# Patient Record
Sex: Male | Born: 1945 | Race: White | Hispanic: No | Marital: Married | Smoking: Former smoker
Health system: Southern US, Community
[De-identification: ages and names within clinical notes are randomized; demographics above are authoritative.]

## PROBLEM LIST (undated history)

## (undated) ENCOUNTER — Emergency Department (HOSPITAL_COMMUNITY): Admission: EM | Discharge status: Absent without leave | Payer: Medicare Other

## (undated) DIAGNOSIS — I10 Essential (primary) hypertension: Secondary | ICD-10-CM

## (undated) DIAGNOSIS — M5416 Radiculopathy, lumbar region: Secondary | ICD-10-CM

## (undated) DIAGNOSIS — I219 Acute myocardial infarction, unspecified: Secondary | ICD-10-CM

## (undated) DIAGNOSIS — I25810 Atherosclerosis of coronary artery bypass graft(s) without angina pectoris: Secondary | ICD-10-CM

## (undated) DIAGNOSIS — I77810 Thoracic aortic ectasia: Secondary | ICD-10-CM

## (undated) DIAGNOSIS — IMO0001 Reserved for inherently not codable concepts without codable children: Secondary | ICD-10-CM

## (undated) DIAGNOSIS — M109 Gout, unspecified: Secondary | ICD-10-CM

## (undated) DIAGNOSIS — I5032 Chronic diastolic (congestive) heart failure: Secondary | ICD-10-CM

## (undated) DIAGNOSIS — Z950 Presence of cardiac pacemaker: Secondary | ICD-10-CM

## (undated) DIAGNOSIS — N400 Enlarged prostate without lower urinary tract symptoms: Secondary | ICD-10-CM

## (undated) DIAGNOSIS — M549 Dorsalgia, unspecified: Secondary | ICD-10-CM

## (undated) DIAGNOSIS — F329 Major depressive disorder, single episode, unspecified: Secondary | ICD-10-CM

## (undated) DIAGNOSIS — I509 Heart failure, unspecified: Secondary | ICD-10-CM

## (undated) DIAGNOSIS — G629 Polyneuropathy, unspecified: Secondary | ICD-10-CM

## (undated) DIAGNOSIS — IMO0002 Reserved for concepts with insufficient information to code with codable children: Secondary | ICD-10-CM

## (undated) DIAGNOSIS — E785 Hyperlipidemia, unspecified: Secondary | ICD-10-CM

## (undated) DIAGNOSIS — C61 Malignant neoplasm of prostate: Secondary | ICD-10-CM

## (undated) DIAGNOSIS — I442 Atrioventricular block, complete: Secondary | ICD-10-CM

## (undated) DIAGNOSIS — M1990 Unspecified osteoarthritis, unspecified site: Secondary | ICD-10-CM

## (undated) DIAGNOSIS — I2510 Atherosclerotic heart disease of native coronary artery without angina pectoris: Secondary | ICD-10-CM

## (undated) DIAGNOSIS — G4730 Sleep apnea, unspecified: Secondary | ICD-10-CM

## (undated) DIAGNOSIS — H269 Unspecified cataract: Secondary | ICD-10-CM

## (undated) DIAGNOSIS — E119 Type 2 diabetes mellitus without complications: Secondary | ICD-10-CM

## (undated) DIAGNOSIS — G4700 Insomnia, unspecified: Secondary | ICD-10-CM

## (undated) DIAGNOSIS — Z8669 Personal history of other diseases of the nervous system and sense organs: Secondary | ICD-10-CM

## (undated) DIAGNOSIS — M62838 Other muscle spasm: Secondary | ICD-10-CM

## (undated) DIAGNOSIS — G8929 Other chronic pain: Secondary | ICD-10-CM

## (undated) HISTORY — DX: Atrioventricular block, complete: I44.2

## (undated) HISTORY — DX: Atherosclerosis of coronary artery bypass graft(s) without angina pectoris: I25.810

## (undated) HISTORY — DX: Essential (primary) hypertension: I10

## (undated) HISTORY — DX: Reserved for concepts with insufficient information to code with codable children: IMO0002

## (undated) HISTORY — DX: Malignant neoplasm of prostate: C61

## (undated) HISTORY — DX: Gout, unspecified: M10.9

## (undated) HISTORY — DX: Radiculopathy, lumbar region: M54.16

## (undated) HISTORY — DX: Benign prostatic hyperplasia without lower urinary tract symptoms: N40.0

## (undated) HISTORY — DX: Atherosclerotic heart disease of native coronary artery without angina pectoris: I25.10

## (undated) HISTORY — DX: Chronic diastolic (congestive) heart failure: I50.32

## (undated) HISTORY — PX: OTHER SURGICAL HISTORY: SHX169

## (undated) HISTORY — DX: Major depressive disorder, single episode, unspecified: F32.9

## (undated) HISTORY — PX: COLONOSCOPY: SHX174

## (undated) HISTORY — DX: Hyperlipidemia, unspecified: E78.5

## (undated) HISTORY — DX: Type 2 diabetes mellitus without complications: E11.9

## (undated) HISTORY — DX: Thoracic aortic ectasia: I77.810

## (undated) HISTORY — DX: Presence of cardiac pacemaker: Z95.0

## (undated) HISTORY — DX: Insomnia, unspecified: G47.00

## (undated) HISTORY — PX: INSERT / REPLACE / REMOVE PACEMAKER: SUR710

---

## 1968-09-30 HISTORY — PX: OTHER SURGICAL HISTORY: SHX169

## 1989-01-30 HISTORY — PX: OTHER SURGICAL HISTORY: SHX169

## 1990-01-30 HISTORY — PX: CORONARY ARTERY BYPASS GRAFT: SHX141

## 2000-06-06 ENCOUNTER — Inpatient Hospital Stay (HOSPITAL_COMMUNITY): Admission: EM | Admit: 2000-06-06 | Discharge: 2000-06-08 | Payer: Self-pay | Admitting: Emergency Medicine

## 2000-06-06 ENCOUNTER — Encounter: Payer: Self-pay | Admitting: Emergency Medicine

## 2000-11-28 ENCOUNTER — Inpatient Hospital Stay (HOSPITAL_COMMUNITY): Admission: RE | Admit: 2000-11-28 | Discharge: 2000-11-30 | Payer: Self-pay | Admitting: *Deleted

## 2001-01-04 ENCOUNTER — Encounter: Payer: Self-pay | Admitting: Internal Medicine

## 2001-01-04 ENCOUNTER — Ambulatory Visit (HOSPITAL_COMMUNITY): Admission: RE | Admit: 2001-01-04 | Discharge: 2001-01-04 | Payer: Self-pay | Admitting: Internal Medicine

## 2001-07-26 ENCOUNTER — Inpatient Hospital Stay (HOSPITAL_COMMUNITY): Admission: EM | Admit: 2001-07-26 | Discharge: 2001-07-30 | Payer: Self-pay | Admitting: Emergency Medicine

## 2001-07-26 ENCOUNTER — Encounter: Payer: Self-pay | Admitting: Emergency Medicine

## 2001-08-26 ENCOUNTER — Encounter: Payer: Self-pay | Admitting: Emergency Medicine

## 2001-08-26 ENCOUNTER — Inpatient Hospital Stay (HOSPITAL_COMMUNITY): Admission: EM | Admit: 2001-08-26 | Discharge: 2001-08-29 | Payer: Self-pay | Admitting: Emergency Medicine

## 2001-08-29 ENCOUNTER — Encounter: Payer: Self-pay | Admitting: Cardiology

## 2002-01-13 ENCOUNTER — Ambulatory Visit (HOSPITAL_COMMUNITY): Admission: RE | Admit: 2002-01-13 | Discharge: 2002-01-14 | Payer: Self-pay | Admitting: Cardiology

## 2002-01-13 ENCOUNTER — Encounter: Payer: Self-pay | Admitting: Cardiology

## 2002-12-01 ENCOUNTER — Inpatient Hospital Stay (HOSPITAL_COMMUNITY): Admission: EM | Admit: 2002-12-01 | Discharge: 2002-12-03 | Payer: Self-pay | Admitting: Emergency Medicine

## 2002-12-07 ENCOUNTER — Emergency Department (HOSPITAL_COMMUNITY): Admission: EM | Admit: 2002-12-07 | Discharge: 2002-12-07 | Payer: Self-pay | Admitting: Emergency Medicine

## 2002-12-16 ENCOUNTER — Inpatient Hospital Stay (HOSPITAL_COMMUNITY): Admission: EM | Admit: 2002-12-16 | Discharge: 2002-12-19 | Payer: Self-pay | Admitting: Emergency Medicine

## 2003-02-02 ENCOUNTER — Encounter (HOSPITAL_COMMUNITY): Admission: RE | Admit: 2003-02-02 | Discharge: 2003-03-31 | Payer: Self-pay | Admitting: Cardiology

## 2003-04-01 ENCOUNTER — Encounter (HOSPITAL_COMMUNITY): Admission: RE | Admit: 2003-04-01 | Discharge: 2003-06-30 | Payer: Self-pay | Admitting: Cardiology

## 2003-07-01 ENCOUNTER — Encounter (HOSPITAL_COMMUNITY): Admission: RE | Admit: 2003-07-01 | Discharge: 2003-09-29 | Payer: Self-pay | Admitting: Cardiology

## 2003-10-06 ENCOUNTER — Inpatient Hospital Stay (HOSPITAL_COMMUNITY): Admission: EM | Admit: 2003-10-06 | Discharge: 2003-10-08 | Payer: Self-pay | Admitting: Emergency Medicine

## 2003-11-19 ENCOUNTER — Encounter: Admission: RE | Admit: 2003-11-19 | Discharge: 2003-12-21 | Payer: Self-pay | Admitting: Cardiology

## 2003-12-04 ENCOUNTER — Ambulatory Visit: Payer: Self-pay | Admitting: Cardiovascular Disease

## 2003-12-11 ENCOUNTER — Ambulatory Visit: Payer: Self-pay | Admitting: Cardiology

## 2004-01-12 ENCOUNTER — Ambulatory Visit: Payer: Self-pay | Admitting: Internal Medicine

## 2004-01-24 ENCOUNTER — Emergency Department (HOSPITAL_COMMUNITY): Admission: EM | Admit: 2004-01-24 | Discharge: 2004-01-24 | Payer: Self-pay | Admitting: Emergency Medicine

## 2004-01-26 ENCOUNTER — Emergency Department (HOSPITAL_COMMUNITY): Admission: EM | Admit: 2004-01-26 | Discharge: 2004-01-26 | Payer: Self-pay | Admitting: Emergency Medicine

## 2004-01-27 ENCOUNTER — Emergency Department (HOSPITAL_COMMUNITY): Admission: EM | Admit: 2004-01-27 | Discharge: 2004-01-27 | Payer: Self-pay | Admitting: Emergency Medicine

## 2004-02-02 ENCOUNTER — Ambulatory Visit: Payer: Self-pay | Admitting: Cardiology

## 2004-02-08 ENCOUNTER — Ambulatory Visit: Payer: Self-pay | Admitting: Internal Medicine

## 2004-03-07 ENCOUNTER — Ambulatory Visit: Payer: Self-pay | Admitting: Internal Medicine

## 2004-03-24 ENCOUNTER — Ambulatory Visit: Payer: Self-pay | Admitting: Internal Medicine

## 2004-05-04 ENCOUNTER — Inpatient Hospital Stay (HOSPITAL_COMMUNITY): Admission: EM | Admit: 2004-05-04 | Discharge: 2004-05-06 | Payer: Self-pay | Admitting: Emergency Medicine

## 2004-05-04 ENCOUNTER — Ambulatory Visit: Payer: Self-pay | Admitting: Cardiology

## 2004-05-11 ENCOUNTER — Ambulatory Visit: Payer: Self-pay | Admitting: Internal Medicine

## 2004-06-08 ENCOUNTER — Ambulatory Visit: Payer: Self-pay | Admitting: Cardiology

## 2004-06-13 ENCOUNTER — Ambulatory Visit: Payer: Self-pay | Admitting: Cardiology

## 2004-06-20 ENCOUNTER — Ambulatory Visit: Payer: Self-pay | Admitting: Internal Medicine

## 2004-07-07 ENCOUNTER — Ambulatory Visit: Payer: Self-pay | Admitting: Internal Medicine

## 2004-07-11 ENCOUNTER — Emergency Department (HOSPITAL_COMMUNITY): Admission: EM | Admit: 2004-07-11 | Discharge: 2004-07-11 | Payer: Self-pay | Admitting: Emergency Medicine

## 2004-08-14 ENCOUNTER — Inpatient Hospital Stay (HOSPITAL_COMMUNITY): Admission: EM | Admit: 2004-08-14 | Discharge: 2004-08-18 | Payer: Self-pay | Admitting: Emergency Medicine

## 2004-08-14 ENCOUNTER — Ambulatory Visit: Payer: Self-pay | Admitting: Cardiology

## 2004-09-01 ENCOUNTER — Ambulatory Visit: Payer: Self-pay | Admitting: Internal Medicine

## 2004-09-20 ENCOUNTER — Ambulatory Visit: Payer: Self-pay | Admitting: Internal Medicine

## 2004-09-29 ENCOUNTER — Ambulatory Visit: Payer: Self-pay | Admitting: Nurse Practitioner

## 2004-10-31 ENCOUNTER — Ambulatory Visit: Payer: Self-pay | Admitting: Cardiovascular Disease

## 2004-10-31 ENCOUNTER — Inpatient Hospital Stay (HOSPITAL_COMMUNITY): Admission: AD | Admit: 2004-10-31 | Discharge: 2004-11-02 | Payer: Self-pay | Admitting: Cardiovascular Disease

## 2004-11-09 ENCOUNTER — Ambulatory Visit: Payer: Self-pay | Admitting: Cardiology

## 2004-11-24 ENCOUNTER — Ambulatory Visit: Payer: Self-pay | Admitting: Internal Medicine

## 2005-01-11 ENCOUNTER — Ambulatory Visit: Payer: Self-pay | Admitting: Cardiology

## 2005-02-02 ENCOUNTER — Ambulatory Visit: Payer: Self-pay | Admitting: Cardiology

## 2005-05-04 ENCOUNTER — Ambulatory Visit: Payer: Self-pay | Admitting: Cardiology

## 2005-05-12 ENCOUNTER — Ambulatory Visit: Payer: Self-pay

## 2005-05-17 ENCOUNTER — Ambulatory Visit: Payer: Self-pay | Admitting: Cardiology

## 2005-05-18 ENCOUNTER — Ambulatory Visit: Payer: Self-pay | Admitting: Cardiology

## 2005-05-18 ENCOUNTER — Inpatient Hospital Stay (HOSPITAL_BASED_OUTPATIENT_CLINIC_OR_DEPARTMENT_OTHER): Admission: RE | Admit: 2005-05-18 | Discharge: 2005-05-18 | Payer: Self-pay | Admitting: Cardiology

## 2005-05-29 ENCOUNTER — Ambulatory Visit: Payer: Self-pay | Admitting: Internal Medicine

## 2005-06-12 ENCOUNTER — Ambulatory Visit: Payer: Self-pay | Admitting: Cardiology

## 2005-06-14 ENCOUNTER — Ambulatory Visit: Payer: Self-pay | Admitting: Cardiology

## 2005-06-19 ENCOUNTER — Ambulatory Visit: Payer: Self-pay | Admitting: Internal Medicine

## 2005-07-31 ENCOUNTER — Ambulatory Visit: Payer: Self-pay | Admitting: Internal Medicine

## 2005-08-09 ENCOUNTER — Ambulatory Visit: Payer: Self-pay | Admitting: Internal Medicine

## 2005-08-09 ENCOUNTER — Inpatient Hospital Stay (HOSPITAL_COMMUNITY): Admission: AD | Admit: 2005-08-09 | Discharge: 2005-08-11 | Payer: Self-pay | Admitting: Internal Medicine

## 2005-08-18 ENCOUNTER — Ambulatory Visit: Payer: Self-pay

## 2005-08-30 ENCOUNTER — Ambulatory Visit: Payer: Self-pay | Admitting: *Deleted

## 2005-10-13 ENCOUNTER — Ambulatory Visit: Payer: Self-pay | Admitting: Cardiology

## 2005-10-29 ENCOUNTER — Ambulatory Visit: Payer: Self-pay | Admitting: Cardiology

## 2005-10-29 ENCOUNTER — Inpatient Hospital Stay (HOSPITAL_COMMUNITY): Admission: EM | Admit: 2005-10-29 | Discharge: 2005-10-31 | Payer: Self-pay | Admitting: Emergency Medicine

## 2005-11-02 ENCOUNTER — Ambulatory Visit: Payer: Self-pay | Admitting: Internal Medicine

## 2005-11-16 ENCOUNTER — Ambulatory Visit (HOSPITAL_COMMUNITY): Admission: RE | Admit: 2005-11-16 | Discharge: 2005-11-16 | Payer: Self-pay | Admitting: Internal Medicine

## 2005-11-16 ENCOUNTER — Encounter (INDEPENDENT_AMBULATORY_CARE_PROVIDER_SITE_OTHER): Payer: Self-pay | Admitting: *Deleted

## 2006-02-02 ENCOUNTER — Ambulatory Visit: Payer: Self-pay | Admitting: Internal Medicine

## 2006-02-08 ENCOUNTER — Ambulatory Visit: Payer: Self-pay | Admitting: Cardiology

## 2006-03-28 ENCOUNTER — Inpatient Hospital Stay (HOSPITAL_COMMUNITY): Admission: EM | Admit: 2006-03-28 | Discharge: 2006-03-31 | Payer: Self-pay | Admitting: Emergency Medicine

## 2006-03-28 ENCOUNTER — Ambulatory Visit: Payer: Self-pay | Admitting: Cardiology

## 2006-03-29 ENCOUNTER — Encounter: Payer: Self-pay | Admitting: Cardiology

## 2006-04-05 ENCOUNTER — Encounter: Payer: Self-pay | Admitting: Internal Medicine

## 2006-04-10 ENCOUNTER — Ambulatory Visit: Payer: Self-pay | Admitting: Internal Medicine

## 2006-04-10 LAB — CONVERTED CEMR LAB
Calcium: 9.4 mg/dL (ref 8.4–10.5)
GFR calc Af Amer: 79 mL/min
GFR calc non Af Amer: 66 mL/min
Glucose, Bld: 166 mg/dL — ABNORMAL HIGH (ref 70–99)

## 2006-04-23 ENCOUNTER — Ambulatory Visit: Payer: Self-pay | Admitting: Cardiology

## 2006-05-22 ENCOUNTER — Ambulatory Visit: Payer: Self-pay | Admitting: Cardiology

## 2006-06-21 ENCOUNTER — Encounter (HOSPITAL_COMMUNITY): Admission: RE | Admit: 2006-06-21 | Discharge: 2006-09-19 | Payer: Self-pay | Admitting: Cardiology

## 2006-07-16 ENCOUNTER — Ambulatory Visit: Payer: Self-pay | Admitting: Internal Medicine

## 2006-07-16 LAB — CONVERTED CEMR LAB
Calcium: 9 mg/dL (ref 8.4–10.5)
Chloride: 107 meq/L (ref 96–112)
Cholesterol: 114 mg/dL (ref 0–200)
GFR calc non Af Amer: 81 mL/min
Glucose, Bld: 152 mg/dL — ABNORMAL HIGH (ref 70–99)
Hgb A1c MFr Bld: 7.3 % — ABNORMAL HIGH (ref 4.6–6.0)
LDL Cholesterol: 64 mg/dL (ref 0–99)
VLDL: 19 mg/dL (ref 0–40)

## 2006-07-24 ENCOUNTER — Ambulatory Visit: Payer: Self-pay | Admitting: Cardiology

## 2006-08-28 ENCOUNTER — Ambulatory Visit: Payer: Self-pay | Admitting: Internal Medicine

## 2006-08-29 ENCOUNTER — Encounter: Payer: Self-pay | Admitting: Internal Medicine

## 2006-08-29 DIAGNOSIS — I2510 Atherosclerotic heart disease of native coronary artery without angina pectoris: Secondary | ICD-10-CM

## 2006-08-29 DIAGNOSIS — F3289 Other specified depressive episodes: Secondary | ICD-10-CM

## 2006-08-29 DIAGNOSIS — E119 Type 2 diabetes mellitus without complications: Secondary | ICD-10-CM

## 2006-08-29 DIAGNOSIS — E785 Hyperlipidemia, unspecified: Secondary | ICD-10-CM

## 2006-08-29 DIAGNOSIS — E1140 Type 2 diabetes mellitus with diabetic neuropathy, unspecified: Secondary | ICD-10-CM

## 2006-08-29 DIAGNOSIS — E1165 Type 2 diabetes mellitus with hyperglycemia: Secondary | ICD-10-CM

## 2006-08-29 DIAGNOSIS — N400 Enlarged prostate without lower urinary tract symptoms: Secondary | ICD-10-CM

## 2006-08-29 DIAGNOSIS — E663 Overweight: Secondary | ICD-10-CM | POA: Insufficient documentation

## 2006-08-29 DIAGNOSIS — I10 Essential (primary) hypertension: Secondary | ICD-10-CM

## 2006-08-29 DIAGNOSIS — F329 Major depressive disorder, single episode, unspecified: Secondary | ICD-10-CM

## 2006-08-29 DIAGNOSIS — I509 Heart failure, unspecified: Secondary | ICD-10-CM

## 2006-08-29 HISTORY — DX: Essential (primary) hypertension: I10

## 2006-08-29 HISTORY — DX: Benign prostatic hyperplasia without lower urinary tract symptoms: N40.0

## 2006-08-29 HISTORY — DX: Other specified depressive episodes: F32.89

## 2006-08-29 HISTORY — DX: Atherosclerotic heart disease of native coronary artery without angina pectoris: I25.10

## 2006-08-29 HISTORY — DX: Hyperlipidemia, unspecified: E78.5

## 2006-08-29 HISTORY — DX: Major depressive disorder, single episode, unspecified: F32.9

## 2006-08-29 HISTORY — DX: Type 2 diabetes mellitus without complications: E11.9

## 2006-09-12 ENCOUNTER — Ambulatory Visit: Payer: Self-pay | Admitting: Cardiology

## 2006-09-20 ENCOUNTER — Encounter (HOSPITAL_COMMUNITY): Admission: RE | Admit: 2006-09-20 | Discharge: 2006-10-30 | Payer: Self-pay | Admitting: Cardiology

## 2006-11-20 ENCOUNTER — Ambulatory Visit: Payer: Self-pay | Admitting: Cardiology

## 2006-11-26 ENCOUNTER — Encounter: Payer: Self-pay | Admitting: Internal Medicine

## 2006-11-26 ENCOUNTER — Ambulatory Visit: Payer: Self-pay | Admitting: Internal Medicine

## 2006-11-26 DIAGNOSIS — J309 Allergic rhinitis, unspecified: Secondary | ICD-10-CM | POA: Insufficient documentation

## 2006-11-26 DIAGNOSIS — C61 Malignant neoplasm of prostate: Secondary | ICD-10-CM

## 2006-11-26 DIAGNOSIS — F528 Other sexual dysfunction not due to a substance or known physiological condition: Secondary | ICD-10-CM

## 2006-11-26 HISTORY — DX: Malignant neoplasm of prostate: C61

## 2006-11-29 LAB — CONVERTED CEMR LAB
CO2: 35 meq/L — ABNORMAL HIGH (ref 19–32)
Chloride: 103 meq/L (ref 96–112)
Cholesterol: 121 mg/dL (ref 0–200)
Creatinine, Ser: 1 mg/dL (ref 0.4–1.5)
Glucose, Bld: 134 mg/dL — ABNORMAL HIGH (ref 70–99)
HDL: 44.3 mg/dL (ref >39.0)
Sodium: 144 meq/L (ref 135–145)

## 2006-12-11 ENCOUNTER — Ambulatory Visit: Payer: Self-pay | Admitting: Cardiology

## 2006-12-11 LAB — CONVERTED CEMR LAB
BUN: 17 mg/dL (ref 6–23)
Basophils Absolute: 0 10*3/uL (ref 0.0–0.1)
Basophils Relative: 0.4 % (ref 0.0–1.0)
CO2: 29 meq/L (ref 19–32)
GFR calc Af Amer: 88 mL/min
Hemoglobin: 13.3 g/dL (ref 13.0–17.0)
Lymphocytes Relative: 32 % (ref 12.0–46.0)
MCHC: 35.5 g/dL (ref 30.0–36.0)
Monocytes Absolute: 0.7 10*3/uL (ref 0.2–0.7)
Monocytes Relative: 10 % (ref 3.0–11.0)
Neutro Abs: 3.5 10*3/uL (ref 1.4–7.7)
Potassium: 4.4 meq/L (ref 3.5–5.1)

## 2006-12-18 ENCOUNTER — Ambulatory Visit: Payer: Self-pay | Admitting: Cardiology

## 2006-12-18 LAB — CONVERTED CEMR LAB
BUN: 16 mg/dL (ref 6–23)
CO2: 28 meq/L (ref 19–32)
GFR calc Af Amer: 88 mL/min
Potassium: 4 meq/L (ref 3.5–5.1)
Sodium: 138 meq/L (ref 135–145)

## 2007-01-14 ENCOUNTER — Ambulatory Visit: Payer: Self-pay | Admitting: Internal Medicine

## 2007-01-14 DIAGNOSIS — R21 Rash and other nonspecific skin eruption: Secondary | ICD-10-CM

## 2007-02-18 ENCOUNTER — Ambulatory Visit: Payer: Self-pay | Admitting: Internal Medicine

## 2007-02-18 DIAGNOSIS — S93409A Sprain of unspecified ligament of unspecified ankle, initial encounter: Secondary | ICD-10-CM | POA: Insufficient documentation

## 2007-02-20 ENCOUNTER — Telehealth (INDEPENDENT_AMBULATORY_CARE_PROVIDER_SITE_OTHER): Payer: Self-pay | Admitting: *Deleted

## 2007-03-04 ENCOUNTER — Ambulatory Visit: Payer: Self-pay | Admitting: Internal Medicine

## 2007-03-20 ENCOUNTER — Ambulatory Visit: Payer: Self-pay

## 2007-03-20 ENCOUNTER — Ambulatory Visit: Payer: Self-pay | Admitting: Cardiology

## 2007-03-20 ENCOUNTER — Encounter: Payer: Self-pay | Admitting: Internal Medicine

## 2007-03-20 ENCOUNTER — Encounter: Payer: Self-pay | Admitting: Cardiology

## 2007-04-04 ENCOUNTER — Ambulatory Visit: Payer: Self-pay | Admitting: Cardiology

## 2007-04-22 ENCOUNTER — Ambulatory Visit: Payer: Self-pay | Admitting: Internal Medicine

## 2007-04-22 DIAGNOSIS — R5381 Other malaise: Secondary | ICD-10-CM | POA: Insufficient documentation

## 2007-04-22 DIAGNOSIS — R5383 Other fatigue: Secondary | ICD-10-CM

## 2007-04-22 DIAGNOSIS — M109 Gout, unspecified: Secondary | ICD-10-CM

## 2007-04-22 HISTORY — DX: Gout, unspecified: M10.9

## 2007-04-23 LAB — CONVERTED CEMR LAB
Basophils Relative: 0.6 % (ref 0.0–1.0)
Bilirubin, Direct: 0.2 mg/dL (ref 0.0–0.3)
CO2: 29 meq/L (ref 19–32)
Creatinine, Ser: 0.9 mg/dL (ref 0.4–1.5)
Eosinophils Relative: 1.8 % (ref 0.0–5.0)
GFR calc Af Amer: 110 mL/min
Glucose, Bld: 98 mg/dL (ref 70–99)
HCT: 41.9 % (ref 39.0–52.0)
HDL: 28.4 mg/dL — ABNORMAL LOW (ref >39.0)
Hemoglobin: 13.7 g/dL (ref 13.0–17.0)
Lymphocytes Relative: 36.4 % (ref 12.0–46.0)
Microalb Creat Ratio: 3.6 mg/g (ref 0.0–30.0)
Microalb, Ur: 0.6 mg/dL (ref 0.0–1.9)
Monocytes Absolute: 0.6 10*3/uL (ref 0.2–0.7)
Monocytes Relative: 9.2 % (ref 3.0–11.0)
Neutro Abs: 3.2 10*3/uL (ref 1.4–7.7)
Neutrophils Relative %: 52 % (ref 43.0–77.0)
Potassium: 4.4 meq/L (ref 3.5–5.1)
Sodium: 143 meq/L (ref 135–145)
TSH: 1.3 microintl units/mL (ref 0.35–5.50)
Total Bilirubin: 0.6 mg/dL (ref 0.3–1.2)
Total Protein: 6.7 g/dL (ref 6.0–8.3)
VLDL: 15 mg/dL (ref 0–40)
WBC: 6.1 10*3/uL (ref 4.5–10.5)

## 2007-05-20 ENCOUNTER — Encounter: Payer: Self-pay | Admitting: Endocrinology

## 2007-06-10 ENCOUNTER — Ambulatory Visit: Payer: Self-pay | Admitting: Internal Medicine

## 2007-06-10 DIAGNOSIS — M5416 Radiculopathy, lumbar region: Secondary | ICD-10-CM

## 2007-06-10 DIAGNOSIS — IMO0002 Reserved for concepts with insufficient information to code with codable children: Secondary | ICD-10-CM

## 2007-06-10 HISTORY — DX: Reserved for concepts with insufficient information to code with codable children: IMO0002

## 2007-06-18 ENCOUNTER — Telehealth: Payer: Self-pay | Admitting: Internal Medicine

## 2007-06-24 ENCOUNTER — Emergency Department (HOSPITAL_COMMUNITY): Admission: EM | Admit: 2007-06-24 | Discharge: 2007-06-24 | Payer: Self-pay | Admitting: Emergency Medicine

## 2007-06-27 ENCOUNTER — Encounter: Admission: RE | Admit: 2007-06-27 | Discharge: 2007-06-27 | Payer: Self-pay | Admitting: Orthopedic Surgery

## 2007-07-30 ENCOUNTER — Telehealth (INDEPENDENT_AMBULATORY_CARE_PROVIDER_SITE_OTHER): Payer: Self-pay | Admitting: *Deleted

## 2007-07-31 ENCOUNTER — Ambulatory Visit: Payer: Self-pay | Admitting: Cardiology

## 2007-07-31 LAB — CONVERTED CEMR LAB
Basophils Relative: 0.8 % (ref 0.0–1.0)
CO2: 28 meq/L (ref 19–32)
Calcium: 9.1 mg/dL (ref 8.4–10.5)
Creatinine, Ser: 0.9 mg/dL (ref 0.4–1.5)
GFR calc Af Amer: 110 mL/min
Glucose, Bld: 126 mg/dL — ABNORMAL HIGH (ref 70–99)
HCT: 39.9 % (ref 39.0–52.0)
Hemoglobin: 13.6 g/dL (ref 13.0–17.0)
Lymphocytes Relative: 35.3 % (ref 12.0–46.0)
MCHC: 34 g/dL (ref 30.0–36.0)
Monocytes Absolute: 0.6 10*3/uL (ref 0.1–1.0)
Monocytes Relative: 9.7 % (ref 3.0–12.0)
Neutro Abs: 3 10*3/uL (ref 1.4–7.7)
RBC: 4.55 M/uL (ref 4.22–5.81)
RDW: 12.9 % (ref 11.5–14.6)
Sodium: 143 meq/L (ref 135–145)

## 2007-09-13 ENCOUNTER — Telehealth (INDEPENDENT_AMBULATORY_CARE_PROVIDER_SITE_OTHER): Payer: Self-pay | Admitting: *Deleted

## 2007-10-23 ENCOUNTER — Ambulatory Visit: Payer: Self-pay | Admitting: Cardiology

## 2007-10-23 ENCOUNTER — Inpatient Hospital Stay (HOSPITAL_COMMUNITY): Admission: EM | Admit: 2007-10-23 | Discharge: 2007-10-25 | Payer: Self-pay | Admitting: Emergency Medicine

## 2007-10-23 ENCOUNTER — Ambulatory Visit: Payer: Self-pay | Admitting: Internal Medicine

## 2007-10-23 DIAGNOSIS — G4700 Insomnia, unspecified: Secondary | ICD-10-CM

## 2007-10-23 HISTORY — DX: Insomnia, unspecified: G47.00

## 2007-10-23 LAB — CONVERTED CEMR LAB
CO2: 29 meq/L (ref 19–32)
Chloride: 109 meq/L (ref 96–112)
GFR calc non Af Amer: 72 mL/min
Hgb A1c MFr Bld: 6.3 % — ABNORMAL HIGH (ref 4.6–6.0)
LDL Cholesterol: 62 mg/dL (ref 0–99)
Potassium: 4.4 meq/L (ref 3.5–5.1)
Sodium: 142 meq/L (ref 135–145)
Total CHOL/HDL Ratio: 3.4
Triglycerides: 87 mg/dL (ref 0–149)

## 2007-10-30 ENCOUNTER — Ambulatory Visit: Payer: Self-pay | Admitting: Cardiology

## 2007-11-14 ENCOUNTER — Ambulatory Visit: Payer: Self-pay | Admitting: Cardiology

## 2008-03-04 ENCOUNTER — Ambulatory Visit: Payer: Self-pay | Admitting: Cardiology

## 2008-03-04 ENCOUNTER — Encounter: Payer: Self-pay | Admitting: Cardiology

## 2008-03-04 DIAGNOSIS — Z950 Presence of cardiac pacemaker: Secondary | ICD-10-CM

## 2008-03-04 DIAGNOSIS — I25810 Atherosclerosis of coronary artery bypass graft(s) without angina pectoris: Secondary | ICD-10-CM | POA: Insufficient documentation

## 2008-03-04 HISTORY — DX: Atherosclerosis of coronary artery bypass graft(s) without angina pectoris: I25.810

## 2008-03-04 HISTORY — DX: Presence of cardiac pacemaker: Z95.0

## 2008-03-04 LAB — CONVERTED CEMR LAB
CO2: 30 meq/L (ref 19–32)
Chloride: 103 meq/L (ref 96–112)
Creatinine, Ser: 1 mg/dL (ref 0.4–1.5)
GFR calc non Af Amer: 80 mL/min
Potassium: 4.6 meq/L (ref 3.5–5.1)
Sodium: 139 meq/L (ref 135–145)

## 2008-04-08 ENCOUNTER — Ambulatory Visit: Payer: Self-pay | Admitting: Internal Medicine

## 2008-04-08 ENCOUNTER — Ambulatory Visit: Payer: Self-pay | Admitting: Cardiology

## 2008-04-08 ENCOUNTER — Encounter (INDEPENDENT_AMBULATORY_CARE_PROVIDER_SITE_OTHER): Payer: Self-pay | Admitting: *Deleted

## 2008-04-08 DIAGNOSIS — M109 Gout, unspecified: Secondary | ICD-10-CM | POA: Insufficient documentation

## 2008-04-20 ENCOUNTER — Telehealth (INDEPENDENT_AMBULATORY_CARE_PROVIDER_SITE_OTHER): Payer: Self-pay | Admitting: *Deleted

## 2008-04-21 ENCOUNTER — Ambulatory Visit: Payer: Self-pay | Admitting: Internal Medicine

## 2008-04-21 DIAGNOSIS — M25569 Pain in unspecified knee: Secondary | ICD-10-CM | POA: Insufficient documentation

## 2008-04-21 DIAGNOSIS — M79609 Pain in unspecified limb: Secondary | ICD-10-CM | POA: Insufficient documentation

## 2008-04-24 ENCOUNTER — Encounter: Payer: Self-pay | Admitting: Internal Medicine

## 2008-04-24 ENCOUNTER — Ambulatory Visit: Payer: Self-pay | Admitting: Cardiovascular Disease

## 2008-04-24 LAB — CONVERTED CEMR LAB
AST: 22 units/L (ref 0–37)
Alkaline Phosphatase: 82 units/L (ref 39–117)
Basophils Absolute: 0 10*3/uL (ref 0.0–0.1)
Bilirubin Urine: NEGATIVE
Bilirubin, Direct: 0.1 mg/dL (ref 0.0–0.3)
CO2: 27 meq/L (ref 19–32)
Calcium: 9.2 mg/dL (ref 8.4–10.5)
Chloride: 106 meq/L (ref 96–112)
Creatinine,U: 126.3 mg/dL
Eosinophils Absolute: 0 10*3/uL (ref 0.0–0.7)
Glucose, Bld: 209 mg/dL — ABNORMAL HIGH (ref 70–99)
HCT: 43.4 % (ref 39.0–52.0)
Hemoglobin, Urine: NEGATIVE
Hemoglobin: 15.1 g/dL (ref 13.0–17.0)
Ketones, ur: NEGATIVE mg/dL
LDL Cholesterol: 49 mg/dL (ref 0–99)
Lymphocytes Relative: 14.1 % (ref 12.0–46.0)
Lymphs Abs: 1.3 10*3/uL (ref 0.7–4.0)
MCHC: 34.7 g/dL (ref 30.0–36.0)
Microalb Creat Ratio: 4 mg/g (ref 0.0–30.0)
Microalb, Ur: 0.5 mg/dL (ref 0.0–1.9)
Neutro Abs: 7.9 10*3/uL — ABNORMAL HIGH (ref 1.4–7.7)
Platelets: 162 10*3/uL (ref 150.0–400.0)
Potassium: 5 meq/L (ref 3.5–5.1)
RDW: 12.8 % (ref 11.5–14.6)
Sodium: 140 meq/L (ref 135–145)
TSH: 0.55 microintl units/mL (ref 0.35–5.50)
Total Protein, Urine: NEGATIVE mg/dL
Total Protein: 6.8 g/dL (ref 6.0–8.3)
Urine Glucose: NEGATIVE mg/dL
VLDL: 10 mg/dL (ref 0.0–40.0)

## 2008-04-28 ENCOUNTER — Encounter: Payer: Self-pay | Admitting: Internal Medicine

## 2008-05-05 ENCOUNTER — Encounter: Payer: Self-pay | Admitting: Internal Medicine

## 2008-05-05 ENCOUNTER — Telehealth: Payer: Self-pay | Admitting: Internal Medicine

## 2008-05-12 ENCOUNTER — Telehealth (INDEPENDENT_AMBULATORY_CARE_PROVIDER_SITE_OTHER): Payer: Self-pay | Admitting: *Deleted

## 2008-05-23 ENCOUNTER — Encounter (INDEPENDENT_AMBULATORY_CARE_PROVIDER_SITE_OTHER): Payer: Self-pay | Admitting: *Deleted

## 2008-06-09 DIAGNOSIS — I5032 Chronic diastolic (congestive) heart failure: Secondary | ICD-10-CM

## 2008-06-09 HISTORY — DX: Chronic diastolic (congestive) heart failure: I50.32

## 2008-06-10 ENCOUNTER — Ambulatory Visit: Payer: Self-pay | Admitting: Cardiology

## 2008-06-10 ENCOUNTER — Telehealth (INDEPENDENT_AMBULATORY_CARE_PROVIDER_SITE_OTHER): Payer: Self-pay | Admitting: *Deleted

## 2008-06-10 DIAGNOSIS — R0609 Other forms of dyspnea: Secondary | ICD-10-CM | POA: Insufficient documentation

## 2008-06-12 ENCOUNTER — Telehealth: Payer: Self-pay | Admitting: Cardiology

## 2008-06-14 ENCOUNTER — Ambulatory Visit: Payer: Self-pay | Admitting: Cardiology

## 2008-06-14 ENCOUNTER — Inpatient Hospital Stay (HOSPITAL_COMMUNITY): Admission: EM | Admit: 2008-06-14 | Discharge: 2008-06-16 | Payer: Self-pay | Admitting: Emergency Medicine

## 2008-06-23 ENCOUNTER — Telehealth (INDEPENDENT_AMBULATORY_CARE_PROVIDER_SITE_OTHER): Payer: Self-pay | Admitting: *Deleted

## 2008-06-24 ENCOUNTER — Ambulatory Visit: Payer: Self-pay

## 2008-06-24 ENCOUNTER — Encounter: Payer: Self-pay | Admitting: Cardiology

## 2008-06-30 ENCOUNTER — Ambulatory Visit: Payer: Self-pay | Admitting: Cardiology

## 2008-07-03 ENCOUNTER — Telehealth (INDEPENDENT_AMBULATORY_CARE_PROVIDER_SITE_OTHER): Payer: Self-pay | Admitting: *Deleted

## 2008-07-09 ENCOUNTER — Ambulatory Visit: Payer: Self-pay | Admitting: Internal Medicine

## 2008-07-09 LAB — CONVERTED CEMR LAB
CO2: 29 meq/L (ref 19–32)
Calcium: 9.2 mg/dL (ref 8.4–10.5)
Creatinine, Ser: 0.9 mg/dL (ref 0.4–1.5)
Hgb A1c MFr Bld: 6.9 % — ABNORMAL HIGH (ref 4.6–6.5)
Total CHOL/HDL Ratio: 3
Triglycerides: 59 mg/dL (ref 0.0–149.0)

## 2008-07-17 ENCOUNTER — Encounter: Payer: Self-pay | Admitting: Internal Medicine

## 2008-07-23 ENCOUNTER — Ambulatory Visit: Payer: Self-pay | Admitting: Cardiology

## 2008-08-04 ENCOUNTER — Encounter: Payer: Self-pay | Admitting: Internal Medicine

## 2008-08-31 ENCOUNTER — Ambulatory Visit: Payer: Self-pay | Admitting: Cardiology

## 2008-09-02 ENCOUNTER — Telehealth: Payer: Self-pay | Admitting: Cardiology

## 2008-09-03 ENCOUNTER — Emergency Department (HOSPITAL_COMMUNITY): Admission: EM | Admit: 2008-09-03 | Discharge: 2008-09-03 | Payer: Self-pay | Admitting: Emergency Medicine

## 2008-09-04 ENCOUNTER — Ambulatory Visit: Payer: Self-pay | Admitting: Cardiology

## 2008-09-04 DIAGNOSIS — I442 Atrioventricular block, complete: Secondary | ICD-10-CM

## 2008-09-04 HISTORY — DX: Atrioventricular block, complete: I44.2

## 2008-09-04 LAB — CONVERTED CEMR LAB
Albumin: 3.5 g/dL (ref 3.5–5.2)
CO2: 26 meq/L (ref 19–32)
Chloride: 112 meq/L (ref 96–112)
Eosinophils Relative: 2.7 % (ref 0.0–5.0)
HCT: 36.4 % — ABNORMAL LOW (ref 39.0–52.0)
HDL: 34.4 mg/dL — ABNORMAL LOW (ref >39.00)
Hemoglobin: 13 g/dL (ref 13.0–17.0)
LDL Cholesterol: 49 mg/dL (ref 0–99)
Lymphs Abs: 1.6 10*3/uL (ref 0.7–4.0)
Monocytes Relative: 8.9 % (ref 3.0–12.0)
Neutro Abs: 3.3 10*3/uL (ref 1.4–7.7)
Sodium: 143 meq/L (ref 135–145)
Total CHOL/HDL Ratio: 3
Triglycerides: 68 mg/dL (ref 0.0–149.0)
VLDL: 13.6 mg/dL (ref 0.0–40.0)
WBC: 5.5 10*3/uL (ref 4.5–10.5)

## 2008-09-07 ENCOUNTER — Ambulatory Visit: Payer: Self-pay | Admitting: Internal Medicine

## 2008-09-07 DIAGNOSIS — S61209A Unspecified open wound of unspecified finger without damage to nail, initial encounter: Secondary | ICD-10-CM | POA: Insufficient documentation

## 2008-09-09 ENCOUNTER — Telehealth (INDEPENDENT_AMBULATORY_CARE_PROVIDER_SITE_OTHER): Payer: Self-pay | Admitting: *Deleted

## 2008-09-17 ENCOUNTER — Telehealth: Payer: Self-pay | Admitting: Cardiology

## 2008-09-21 ENCOUNTER — Telehealth (INDEPENDENT_AMBULATORY_CARE_PROVIDER_SITE_OTHER): Payer: Self-pay | Admitting: *Deleted

## 2008-09-22 ENCOUNTER — Encounter: Payer: Self-pay | Admitting: Internal Medicine

## 2008-09-22 ENCOUNTER — Encounter: Payer: Self-pay | Admitting: Cardiology

## 2008-09-22 ENCOUNTER — Telehealth: Payer: Self-pay | Admitting: Cardiology

## 2008-09-23 ENCOUNTER — Encounter (INDEPENDENT_AMBULATORY_CARE_PROVIDER_SITE_OTHER): Payer: Self-pay | Admitting: *Deleted

## 2008-10-01 ENCOUNTER — Encounter
Admission: RE | Admit: 2008-10-01 | Discharge: 2008-10-16 | Payer: Self-pay | Admitting: Physical Medicine and Rehabilitation

## 2008-10-14 ENCOUNTER — Encounter: Payer: Self-pay | Admitting: Cardiology

## 2008-10-14 ENCOUNTER — Encounter: Payer: Self-pay | Admitting: Internal Medicine

## 2008-10-16 ENCOUNTER — Encounter: Payer: Self-pay | Admitting: Cardiology

## 2008-10-16 ENCOUNTER — Encounter: Payer: Self-pay | Admitting: Internal Medicine

## 2008-10-20 ENCOUNTER — Encounter (INDEPENDENT_AMBULATORY_CARE_PROVIDER_SITE_OTHER): Payer: Self-pay | Admitting: Physical Medicine and Rehabilitation

## 2008-10-20 ENCOUNTER — Ambulatory Visit (HOSPITAL_COMMUNITY)
Admission: RE | Admit: 2008-10-20 | Discharge: 2008-10-20 | Payer: Self-pay | Admitting: Physical Medicine and Rehabilitation

## 2008-10-20 ENCOUNTER — Encounter: Payer: Self-pay | Admitting: Cardiology

## 2008-10-22 ENCOUNTER — Ambulatory Visit: Payer: Self-pay | Admitting: Internal Medicine

## 2008-10-22 DIAGNOSIS — R609 Edema, unspecified: Secondary | ICD-10-CM

## 2008-10-23 LAB — CONVERTED CEMR LAB
Albumin: 3.5 g/dL (ref 3.5–5.2)
Alkaline Phosphatase: 55 units/L (ref 39–117)
Basophils Absolute: 0 10*3/uL (ref 0.0–0.1)
CO2: 28 meq/L (ref 19–32)
Calcium: 8.7 mg/dL (ref 8.4–10.5)
Chloride: 99 meq/L (ref 96–112)
Cholesterol: 104 mg/dL (ref 0–200)
Creatinine, Ser: 0.9 mg/dL (ref 0.4–1.5)
Eosinophils Absolute: 0.1 10*3/uL (ref 0.0–0.7)
Glucose, Bld: 137 mg/dL — ABNORMAL HIGH (ref 70–99)
HDL: 36.3 mg/dL — ABNORMAL LOW (ref >39.00)
Hemoglobin: 13.6 g/dL (ref 13.0–17.0)
Lymphocytes Relative: 19.2 % (ref 12.0–46.0)
MCHC: 33.7 g/dL (ref 30.0–36.0)
MCV: 89.4 fL (ref 78.0–100.0)
Monocytes Absolute: 0.8 10*3/uL (ref 0.1–1.0)
Neutro Abs: 7.9 10*3/uL — ABNORMAL HIGH (ref 1.4–7.7)
RDW: 13 % (ref 11.5–14.6)
Sodium: 136 meq/L (ref 135–145)
TSH: 1.24 microintl units/mL (ref 0.35–5.50)
Total CHOL/HDL Ratio: 3
Total Protein: 7.6 g/dL (ref 6.0–8.3)
Triglycerides: 71 mg/dL (ref 0.0–149.0)

## 2008-10-27 ENCOUNTER — Encounter: Payer: Self-pay | Admitting: Internal Medicine

## 2008-10-27 ENCOUNTER — Encounter: Payer: Self-pay | Admitting: Cardiology

## 2008-10-29 ENCOUNTER — Ambulatory Visit: Payer: Self-pay | Admitting: Surgery

## 2008-11-05 ENCOUNTER — Ambulatory Visit: Payer: Self-pay | Admitting: Internal Medicine

## 2008-11-09 ENCOUNTER — Telehealth: Payer: Self-pay | Admitting: Cardiology

## 2008-11-20 ENCOUNTER — Ambulatory Visit: Payer: Self-pay | Admitting: Cardiology

## 2008-12-02 ENCOUNTER — Telehealth: Payer: Self-pay | Admitting: Cardiology

## 2008-12-03 ENCOUNTER — Ambulatory Visit: Payer: Self-pay | Admitting: Internal Medicine

## 2009-01-01 ENCOUNTER — Encounter (INDEPENDENT_AMBULATORY_CARE_PROVIDER_SITE_OTHER): Payer: Self-pay | Admitting: *Deleted

## 2009-01-04 ENCOUNTER — Ambulatory Visit: Payer: Self-pay | Admitting: Cardiology

## 2009-03-09 ENCOUNTER — Ambulatory Visit: Payer: Self-pay | Admitting: Internal Medicine

## 2009-03-09 DIAGNOSIS — R972 Elevated prostate specific antigen [PSA]: Secondary | ICD-10-CM

## 2009-03-10 LAB — CONVERTED CEMR LAB
AST: 28 units/L (ref 0–37)
Alkaline Phosphatase: 67 units/L (ref 39–117)
Basophils Absolute: 0 10*3/uL (ref 0.0–0.1)
Bilirubin Urine: NEGATIVE
Bilirubin, Direct: 0.1 mg/dL (ref 0.0–0.3)
CO2: 29 meq/L (ref 19–32)
Calcium: 9.4 mg/dL (ref 8.4–10.5)
Cholesterol: 104 mg/dL (ref 0–200)
Creatinine, Ser: 0.8 mg/dL (ref 0.4–1.5)
Eosinophils Absolute: 0.2 10*3/uL (ref 0.0–0.7)
Folate: 11.7 ng/mL
GFR calc non Af Amer: 103.75 mL/min (ref >60)
Glucose, Bld: 124 mg/dL — ABNORMAL HIGH (ref 70–99)
Hemoglobin: 13.7 g/dL (ref 13.0–17.0)
Hgb A1c MFr Bld: 6.8 % — ABNORMAL HIGH (ref 4.6–6.5)
Iron: 89 ug/dL (ref 42–165)
Leukocytes, UA: NEGATIVE
Lymphocytes Relative: 23.8 % (ref 12.0–46.0)
MCHC: 32.4 g/dL (ref 30.0–36.0)
Monocytes Relative: 8.5 % (ref 3.0–12.0)
Neutro Abs: 4.3 10*3/uL (ref 1.4–7.7)
Neutrophils Relative %: 64.5 % (ref 43.0–77.0)
Nitrite: NEGATIVE
PSA: 2.99 ng/mL (ref 0.10–4.00)
RBC: 4.48 M/uL (ref 4.22–5.81)
RDW: 13.8 % (ref 11.5–14.6)
Saturation Ratios: 23.4 % (ref 20.0–50.0)
Sodium: 142 meq/L (ref 135–145)
Specific Gravity, Urine: 1.025 (ref 1.000–1.030)
Total Protein, Urine: NEGATIVE mg/dL
Transferrin: 272.1 mg/dL (ref 212.0–360.0)
Vitamin B-12: 379 pg/mL (ref 211–911)
pH: 5 (ref 5.0–8.0)

## 2009-03-19 ENCOUNTER — Telehealth (INDEPENDENT_AMBULATORY_CARE_PROVIDER_SITE_OTHER): Payer: Self-pay | Admitting: *Deleted

## 2009-03-24 ENCOUNTER — Ambulatory Visit: Payer: Self-pay | Admitting: Cardiology

## 2009-03-24 ENCOUNTER — Encounter: Payer: Self-pay | Admitting: Internal Medicine

## 2009-04-08 ENCOUNTER — Ambulatory Visit: Payer: Self-pay | Admitting: Internal Medicine

## 2009-04-30 ENCOUNTER — Ambulatory Visit: Payer: Self-pay | Admitting: Cardiology

## 2009-05-06 ENCOUNTER — Ambulatory Visit: Payer: Self-pay | Admitting: Internal Medicine

## 2009-05-27 ENCOUNTER — Encounter: Payer: Self-pay | Admitting: Internal Medicine

## 2009-07-05 ENCOUNTER — Encounter: Payer: Self-pay | Admitting: Cardiology

## 2009-07-05 ENCOUNTER — Ambulatory Visit: Payer: Self-pay

## 2009-07-15 ENCOUNTER — Encounter: Payer: Self-pay | Admitting: Internal Medicine

## 2009-07-27 ENCOUNTER — Encounter: Payer: Self-pay | Admitting: Internal Medicine

## 2009-07-28 ENCOUNTER — Ambulatory Visit: Payer: Self-pay | Admitting: Cardiology

## 2009-08-04 ENCOUNTER — Telehealth: Payer: Self-pay | Admitting: Internal Medicine

## 2009-08-04 ENCOUNTER — Encounter: Payer: Self-pay | Admitting: Internal Medicine

## 2009-08-06 ENCOUNTER — Encounter: Payer: Self-pay | Admitting: Internal Medicine

## 2009-08-11 ENCOUNTER — Encounter: Payer: Self-pay | Admitting: Internal Medicine

## 2009-08-13 ENCOUNTER — Ambulatory Visit: Payer: Self-pay | Admitting: Internal Medicine

## 2009-08-13 DIAGNOSIS — R42 Dizziness and giddiness: Secondary | ICD-10-CM

## 2009-08-13 DIAGNOSIS — R0989 Other specified symptoms and signs involving the circulatory and respiratory systems: Secondary | ICD-10-CM

## 2009-08-13 DIAGNOSIS — R0609 Other forms of dyspnea: Secondary | ICD-10-CM

## 2009-08-17 ENCOUNTER — Ambulatory Visit: Payer: Self-pay | Admitting: Internal Medicine

## 2009-08-19 ENCOUNTER — Encounter: Payer: Self-pay | Admitting: Internal Medicine

## 2009-08-26 ENCOUNTER — Encounter: Payer: Self-pay | Admitting: Internal Medicine

## 2009-08-26 ENCOUNTER — Telehealth: Payer: Self-pay | Admitting: Internal Medicine

## 2009-09-07 ENCOUNTER — Ambulatory Visit: Payer: Self-pay | Admitting: Internal Medicine

## 2009-09-08 ENCOUNTER — Telehealth (INDEPENDENT_AMBULATORY_CARE_PROVIDER_SITE_OTHER): Payer: Self-pay | Admitting: *Deleted

## 2009-09-23 ENCOUNTER — Encounter: Payer: Self-pay | Admitting: Internal Medicine

## 2009-09-29 ENCOUNTER — Telehealth: Payer: Self-pay | Admitting: Cardiology

## 2009-10-27 ENCOUNTER — Encounter (INDEPENDENT_AMBULATORY_CARE_PROVIDER_SITE_OTHER): Payer: Self-pay | Admitting: *Deleted

## 2009-10-27 ENCOUNTER — Ambulatory Visit: Payer: Self-pay | Admitting: Cardiology

## 2009-10-28 LAB — CONVERTED CEMR LAB
Basophils Absolute: 0 10*3/uL (ref 0.0–0.1)
CO2: 28 meq/L (ref 19–32)
Chloride: 110 meq/L (ref 96–112)
Eosinophils Absolute: 0.2 10*3/uL (ref 0.0–0.7)
Glucose, Bld: 112 mg/dL — ABNORMAL HIGH (ref 70–99)
HCT: 39.3 % (ref 39.0–52.0)
Hemoglobin: 13.3 g/dL (ref 13.0–17.0)
Lymphs Abs: 2.3 10*3/uL (ref 0.7–4.0)
MCHC: 33.9 g/dL (ref 30.0–36.0)
Monocytes Relative: 8.2 % (ref 3.0–12.0)
Neutro Abs: 3.1 10*3/uL (ref 1.4–7.7)
Potassium: 4.5 meq/L (ref 3.5–5.1)
RDW: 15.2 % — ABNORMAL HIGH (ref 11.5–14.6)
Sodium: 142 meq/L (ref 135–145)

## 2009-10-29 ENCOUNTER — Ambulatory Visit: Payer: Self-pay | Admitting: Internal Medicine

## 2009-10-30 HISTORY — PX: INSERT / REPLACE / REMOVE PACEMAKER: SUR710

## 2009-11-02 ENCOUNTER — Ambulatory Visit (HOSPITAL_COMMUNITY): Admission: RE | Admit: 2009-11-02 | Discharge: 2009-11-02 | Payer: Self-pay | Admitting: Cardiology

## 2009-11-09 ENCOUNTER — Encounter: Payer: Self-pay | Admitting: Cardiology

## 2009-11-12 ENCOUNTER — Encounter: Payer: Self-pay | Admitting: Cardiology

## 2009-11-12 ENCOUNTER — Ambulatory Visit: Payer: Self-pay

## 2009-11-16 ENCOUNTER — Ambulatory Visit: Payer: Self-pay | Admitting: Cardiology

## 2009-11-18 ENCOUNTER — Ambulatory Visit: Payer: Self-pay | Admitting: Cardiology

## 2009-11-18 ENCOUNTER — Telehealth: Payer: Self-pay | Admitting: Cardiology

## 2009-11-26 ENCOUNTER — Encounter (INDEPENDENT_AMBULATORY_CARE_PROVIDER_SITE_OTHER): Payer: Self-pay | Admitting: *Deleted

## 2009-12-02 ENCOUNTER — Telehealth: Payer: Self-pay | Admitting: Cardiology

## 2009-12-03 ENCOUNTER — Telehealth (INDEPENDENT_AMBULATORY_CARE_PROVIDER_SITE_OTHER): Payer: Self-pay | Admitting: *Deleted

## 2009-12-07 ENCOUNTER — Telehealth: Payer: Self-pay | Admitting: Cardiology

## 2009-12-13 ENCOUNTER — Telehealth: Payer: Self-pay | Admitting: Cardiology

## 2009-12-20 ENCOUNTER — Encounter: Payer: Self-pay | Admitting: Cardiology

## 2009-12-27 ENCOUNTER — Telehealth: Payer: Self-pay | Admitting: Internal Medicine

## 2009-12-27 ENCOUNTER — Ambulatory Visit: Payer: Self-pay | Admitting: Cardiology

## 2010-01-03 ENCOUNTER — Ambulatory Visit: Payer: Self-pay | Admitting: Internal Medicine

## 2010-01-03 DIAGNOSIS — M542 Cervicalgia: Secondary | ICD-10-CM

## 2010-01-03 LAB — CONVERTED CEMR LAB
BUN: 17 mg/dL (ref 6–23)
CO2: 29 meq/L (ref 19–32)
Calcium: 9.6 mg/dL (ref 8.4–10.5)
Chloride: 100 meq/L (ref 96–112)
Cholesterol: 134 mg/dL (ref 0–200)
Creatinine, Ser: 0.9 mg/dL (ref 0.4–1.5)
GFR calc non Af Amer: 93.93 mL/min (ref >60)
Glucose, Bld: 109 mg/dL — ABNORMAL HIGH (ref 70–99)
HDL: 36.9 mg/dL — ABNORMAL LOW (ref >39.00)
Hgb A1c MFr Bld: 6.8 % — ABNORMAL HIGH (ref 4.6–6.5)
LDL Cholesterol: 75 mg/dL (ref 0–99)
Potassium: 4.2 meq/L (ref 3.5–5.1)
Sodium: 137 meq/L (ref 135–145)
Total CHOL/HDL Ratio: 4
Triglycerides: 109 mg/dL (ref 0.0–149.0)
VLDL: 21.8 mg/dL (ref 0.0–40.0)

## 2010-02-20 ENCOUNTER — Encounter: Payer: Self-pay | Admitting: Sports Medicine

## 2010-02-20 ENCOUNTER — Encounter: Payer: Self-pay | Admitting: Internal Medicine

## 2010-02-27 LAB — CONVERTED CEMR LAB
ALT: 30 units/L (ref 0–53)
AST: 28 units/L (ref 0–37)
BUN: 10 mg/dL (ref 6–23)
BUN: 10 mg/dL (ref 6–23)
BUN: 13 mg/dL (ref 6–23)
Basophils Absolute: 0 10*3/uL (ref 0.0–0.1)
Basophils Relative: 0.3 % (ref 0.0–3.0)
Bilirubin, Direct: 0.1 mg/dL (ref 0.0–0.3)
CO2: 27 meq/L (ref 19–32)
CO2: 31 meq/L (ref 19–32)
Chloride: 103 meq/L (ref 96–112)
Chloride: 110 meq/L (ref 96–112)
Creatinine, Ser: 0.7 mg/dL (ref 0.4–1.5)
Eosinophils Relative: 1 % (ref 0.0–5.0)
Eosinophils Relative: 3.1 % (ref 0.0–5.0)
GFR calc non Af Amer: 95.31 mL/min (ref >60)
Glucose, Bld: 123 mg/dL — ABNORMAL HIGH (ref 70–99)
Glucose, Bld: 132 mg/dL — ABNORMAL HIGH (ref 70–99)
HCT: 38 % — ABNORMAL LOW (ref 39.0–52.0)
HCT: 39 % (ref 39.0–52.0)
HCT: 39.9 % (ref 39.0–52.0)
Hemoglobin: 13.2 g/dL (ref 13.0–17.0)
Ketones, ur: NEGATIVE mg/dL
Lymphs Abs: 1.4 10*3/uL (ref 0.7–4.0)
Lymphs Abs: 1.9 10*3/uL (ref 0.7–4.0)
Lymphs Abs: 2.4 10*3/uL (ref 0.7–4.0)
MCHC: 34.1 g/dL (ref 30.0–36.0)
MCV: 88.7 fL (ref 78.0–100.0)
MCV: 93.1 fL (ref 78.0–100.0)
Monocytes Absolute: 0.5 10*3/uL (ref 0.1–1.0)
Monocytes Absolute: 0.5 10*3/uL (ref 0.1–1.0)
Monocytes Relative: 7.6 % (ref 3.0–12.0)
Neutro Abs: 3.7 10*3/uL (ref 1.4–7.7)
Neutrophils Relative %: 59 % (ref 43.0–77.0)
Platelets: 162 10*3/uL (ref 150.0–400.0)
Platelets: 166 10*3/uL (ref 150.0–400.0)
Platelets: 173 10*3/uL (ref 150.0–400.0)
Potassium: 3.8 meq/L (ref 3.5–5.1)
Potassium: 4.6 meq/L (ref 3.5–5.1)
RBC: 4.39 M/uL (ref 4.22–5.81)
RDW: 15.2 % — ABNORMAL HIGH (ref 11.5–14.6)
RDW: 15.8 % — ABNORMAL HIGH (ref 11.5–14.6)
Sodium: 142 meq/L (ref 135–145)
Specific Gravity, Urine: 1.01 (ref 1.000–1.030)
Total Bilirubin: 0.5 mg/dL (ref 0.3–1.2)
Urine Glucose: NEGATIVE mg/dL
WBC: 5.8 10*3/uL (ref 4.5–10.5)
WBC: 7.9 10*3/uL (ref 4.5–10.5)
pH: 5 (ref 5.0–8.0)

## 2010-03-01 NOTE — Letter (Signed)
Summary: Diabetic Testing Supplies/[facility]  Diabetic Testing Supplies/[facility]   Imported By: Sherian Rein 08/05/2009 13:52:12  _____________________________________________________________________  External Attachment:    Type:   Image     Comment:   External Document

## 2010-03-01 NOTE — Progress Notes (Signed)
Summary: discuss med  Phone Note From Other Clinic   Caller: hugh pruitt from research dept [phone] Request: Talk with Nurse Details of Complaint: discuss coming off meds. pt in research study & bb start pt on pravachal.  Initial call taken by: Lorne Skeens,  November 18, 2009 10:12 AM  Follow-up for Phone Call        LM for [facility] to call. Sherri Rad, RN, BSN  November 18, 2009 11:50 AM  Jena Gauss called back and left a message on my voice mail that the pt is in the IMPROVE-IT study (vytorin vs. simvastatin). Dr. Juanda Chance had started him on Pravastatin at his last office visit. Jena Gauss states he called Dr. Juanda Chance this morning and per Dr. Juanda Chance, orders received to have the pt d/c pravastatin and continue study drug. I will note this on the pt's medication list.  Follow-up by: Sherri Rad, RN, BSN,  November 18, 2009 12:12 PM    New/Updated Medications: * IMPROVE-IT STUDY DRUG as directed

## 2010-03-01 NOTE — Assessment & Plan Note (Signed)
Summary: f73m   Primary Provider:  Corwin Levins MD  CC:  f44m.  No cardiac concerns today.  History of Present Illness: 65 years old and return for followup management of CAD and CHF. He had bypass surgery in 1992. His last catheterization was in 2010 at which time he had total occlusion of the vein graft to the circumflex artery and nonobstructive disease in his other grafts. He also has a history of diastolic heart failure and has a DDD pacemaker implanted for AV block.  He says been doing quite well over the last 4 months with no chest pain or palpitations. He does not feel he has an any fluid excess.  His other problems include hypertension and hyperlipidemia.  Current Medications (verified): 1)  Hydrocodone-Acetaminophen 10-325 Mg Tabs (Hydrocodone-Acetaminophen) .Marland Kitchen.. 1po Q 6 Hrs As Needed 2)  Enalapril Maleate 10 Mg Tabs (Enalapril Maleate) .... Take 1 Tablet By Mouth Once Daily 3)  Metformin Hcl 500 Mg Tb24 (Metformin Hcl) .... Take 4 Tablet By Mouth Once A Day 4)  Plavix 75 Mg Tabs (Clopidogrel Bisulfate) .Marland Kitchen.. 1 Tab Once Daily 5)  Xanax 0.25 Mg Tabs (Alprazolam) .Marland Kitchen.. 1 By Mouth Two Times A Day As Needed 6)  Klor-Con M20 20 Meq  Tbcr (Potassium Chloride Crys Cr) .Marland Kitchen.. 1 By Mouth Qd 7)  Triamcinolone Acetonide 0.5 %  Crea (Triamcinolone Acetonide) .... Use Asd Two Times A Day Prn 8)  Freestyle Test  Strp (Glucose Blood) .... Test Blood Glucose Three Times A Day 9)  Carvedilol 12.5 Mg Tabs (Carvedilol) .... Take One Tablet By Mouth Twice A Day 10)  Glimepiride 4 Mg  Tabs (Glimepiride) .Marland Kitchen.. 1 By Mouth Once Daily 11)  Ecotrin 325 Mg  Tbec (Aspirin) .Marland Kitchen.. 1 By Mouth Qd 12)  Imdur 60 Mg Xr24h-Tab (Isosorbide Mononitrate) .Marland Kitchen.. 1 Tab Once Daily 13)  Allopurinol 300 Mg Tabs (Allopurinol) .Marland Kitchen.. 1 By Mouth Once Daily 14)  Gabapentin 300 Mg  Caps (Gabapentin) .Marland Kitchen.. 1po Three Times A Day 15)  Study Drug 16)  Furosemide 80 Mg Tabs (Furosemide) .... Take One Tablet By Mouth Daily Extra If Needed 17)   Stool Softener 100 Mg Caps (Docusate Sodium) .... Use Asd 18)  Colchicine 0.6 Mg Tabs (Colchicine) .Marland Kitchen.. 1 By Mouth Once Daily  Allergies (verified): 1)  ! * Crestor  Past History:  Past Medical History: Reviewed history from 08/30/2008 and no changes required. Diabetes mellitus, type II Obesity Depression Benign prostatic hypertrophy h/o prostate cancer Allergic rhinitis erectile dysfunction Gout  2. Coronary artery disease status post coronary bypass graft surgery     in 1992 and status post multiple percutaneous intervention with 40%     in-stent restenosis in the native right coronary and 70% in-stent     restenosis within the stent in the LAD supplied by the mammary     graft. 3. Good left ventricular function. 4. Diastolic heart failure 5. History of status post dual-mode, dual-pacing, dual-sensing     pacemaker implantation. 6. Hypertension  7. Hyperlipidemia.      Review of Systems       ROS is negative except as outlined in HPI.   Vital Signs:  Patient profile:   65 year old male Height:      69 inches Weight:      233 pounds BMI:     34.53 Pulse rate:   62 / minute Pulse rhythm:   regular BP sitting:   126 / 72  (left arm) Cuff size:   large  Vitals Entered By: Judithe Modest CMA (April 30, 2009 8:43 AM)  Physical Exam  Additional Exam:  Gen. Well-nourished, in no distress   Neck: jugular venous pulse 1 cm above the clavicle, thyroid not enlarged, no carotid bruits Lungs: No tachypnea, clear without rales, rhonchi or wheezes Cardiovascular: Rhythm regular, PMI not displaced,  heart sounds  normal, no murmurs or gallops, trace peripheral edema, pulses normal in all 4 extremities. Abdomen: BS normal, abdomen soft and non-tender without masses or organomegaly, no hepatosplenomegaly. MS: No deformities, no cyanosis or clubbing   Neuro:  No focal sns   Skin:  no lesions    PPM Specifications Following MD:  Everardo Beals. Juanda Chance, MD     PPM Vendor:   [employer]     PPM Model Number:  [id number]     PPM Serial Number:  [id number] PPM DOI:  08/28/2001      Lead 1    Location: [facility]     DOI: 08/28/2001     Model #: [id number]     Serial #: [id number]     Status: active Lead 2    Location: [facility]     DOI: 08/28/2001     Model #: [id number]     Serial #: [id number]     Status: active   Indications:  2 Av Block   PPM Follow Up Pacer Dependent:  Yes      Episodes Coumadin:  No  Parameters Mode:  DDD     Lower Rate Limit:  60     Upper Rate Limit:  130 Paced AV Delay:  150     Sensed AV Delay:  120  Impression & Recommendations:  Problem # 1:  CAD, AUTOLOGOUS BYPASS GRAFT (ICD-414.02) He had bypass surgery in 1992 and had documented occlusion of the vein graft to circumflex with starting 2010. He's had no chest pain this problem appears stable. The following medications were removed from the medication list:    Plavix 75 Mg Tabs (Clopidogrel bisulfate) .Marland Kitchen... 1 tab once daily    Imdur 60 Mg Xr24h-tab (Isosorbide mononitrate) .Marland Kitchen... 1 tab once daily His updated medication list for this problem includes:    Enalapril Maleate 10 Mg Tabs (Enalapril maleate) .Marland Kitchen... Take 1 tablet by mouth once daily    Carvedilol 12.5 Mg Tabs (Carvedilol) .Marland Kitchen... Take one tablet by mouth twice a day    Aspirin 81 Mg Tbec (Aspirin) .Marland Kitchen... Take one tablet by mouth daily  Problem # 2:  DIASTOLIC HEART FAILURE, CHRONIC (ICD-428.32) He has chronic diastolic heart failure.  He has minimal JVD but no other evidence of volume overload. We will continue his current therapy. The following medications were removed from the medication list:    Plavix 75 Mg Tabs (Clopidogrel bisulfate) .Marland Kitchen... 1 tab once daily    Imdur 60 Mg Xr24h-tab (Isosorbide mononitrate) .Marland Kitchen... 1 tab once daily His updated medication list for this problem includes:    Enalapril Maleate 10 Mg Tabs (Enalapril maleate) .Marland Kitchen... Take 1 tablet by mouth once daily    Carvedilol 12.5 Mg Tabs (Carvedilol) .Marland Kitchen... Take one tablet by mouth  twice a day    Aspirin 81 Mg Tbec (Aspirin) .Marland Kitchen... Take one tablet by mouth daily    Furosemide 80 Mg Tabs (Furosemide) .Marland Kitchen... Take one tablet by mouth daily extra if needed  Problem # 3:  PACEMAKER (ICD-V45.Marland Kitchen01) He has a DDD pacemaker implanted for AV block. We checked his pacemaker on last visit and will not take it today but will recheck  on his followup visit.  Problem # 4:  HYPERLIPIDEMIA (ICD-272.4) He had an excellent lipid profile by his primary care physician recently.  Patient Instructions: 1)  Your physician has recommended you make the following change in your medication: 1) STOP Plavix, 2) STOP Imdur, 3) Decrease aspirin to 81mg  once daily  2)  Your physician wants you to follow-up in:  6 months. You will receive a reminder letter in the mail two months in advance. If you don't receive a letter, please call our office to schedule the follow-up appointment.

## 2010-03-01 NOTE — Medication Information (Signed)
Summary: Diabetic Supplies/[facility]  Diabetic Supplies/[facility]   Imported By: Sherian Rein 08/23/2009 11:35:04  _____________________________________________________________________  External Attachment:    Type:   Image     Comment:   External Document

## 2010-03-01 NOTE — Assessment & Plan Note (Signed)
Summary: f/u from 7/15/cd   Vital Signs:  Patient profile:   65 year old male Height:      71 inches Weight:      239.50 pounds BMI:     33.52 O2 Sat:      97 % on Room air Temp:     97.5 degrees F oral Pulse rate:   65 / minute BP sitting:   112 / 64  (left arm) Cuff size:   large  Vitals Entered By: Zella Ball Ewing CMA Duncan Dull) (August 17, 2009 8:09 AM)  O2 Flow:  Room air  CC: Followup/RE   Primary Care Provider:  Corwin Levins MD  CC:  Followup/RE.  History of Present Illness: here to f/u;  overall 75% better he estimates resolved dyspnea, fatigue and dizziness;  has AC at home but tends to sweat more in the summer;  Pt denies CP, sob, doe, wheezing, orthopnea, pnd, worsening LE edema, palps, or syncope Pt denies new neuro symptoms such as headache, facial or extremity weakness  No fever, wt loss, night sweats, loss of appetite or other constitutional symptoms  Gained 7 lbs with current med regimen.  no new complaints.  To see [facility] nurse next in oct 2011 Pt denies polydipsia, polyuria, or low sugar symptoms such as shakiness improved with eating.  Overall good compliance with meds, trying to follow low chol, DM diet, wt stable, little excercise however   Problems Prior to Update: 1)  Dizziness  (ICD-780.4) 2)  Dyspnea On Exertion  (ICD-786.09) 3)  Fatigue, Acute  (ICD-780.79) 4)  Preventive Health Care  (ICD-V70.0) 5)  Psa, Increased  (ICD-790.93) 6)  Special Screening Malig Neoplasms Other Sites  (ICD-V76.49) 7)  Fatigue  (ICD-780.79) 8)  Acute Gouty Arthropathy  (ICD-274.01) 9)  Peripheral Edema  (ICD-782.3) 10)  Laceration, Finger  (ICD-883.0) 11)  Encounter For Long-term Use of Other Medications  (ICD-V58.69) 12)  Atrioventricular Block, Complete  (ICD-426.0) 13)  Dyspnea  (ICD-786.05) 14)  Diastolic Heart Failure, Chronic  (ICD-428.32) 15)  Leg Pain, Bilateral  (ICD-729.5) 16)  Knee Pain, Bilateral  (ICD-719.46) 17)  Acute Gouty Arthropathy   (ICD-274.01) 18)  Pacemaker  (ICD-V45.Marland Kitchen01) 19)  Cad, Autologous Bypass Graft  (ICD-414.02) 20)  Insomnia-sleep Disorder-unspec  (ICD-780.52) 21)  Lumbar Radiculopathy, Right  (ICD-724.4) 22)  Special Screening Malignant Neoplasm of Prostate  (ICD-V76.44) 23)  Gout  (ICD-274.9) 24)  Fatigue  (ICD-780.79) 25)  Ankle Sprain  (ICD-845.00) 26)  Rash-nonvesicular  (ICD-782.1) 27)  Erectile Dysfunction  (ICD-302.72) 28)  Allergic Rhinitis  (ICD-477.9) 29)  Neoplasm, Malignant, Prostate  (ICD-185) 30)  Family History Diabetes 1st Degree Relative  (ICD-V18.0) 31)  Family History of Cad Male 1st Degree Relative <50  (ICD-V17.3) 32)  Coronary Artery Disease  (ICD-414.00) 33)  Benign Prostatic Hypertrophy  (ICD-600.00) 34)  Depression  (ICD-311) 35)  Overweight  (ICD-278.02) 36)  Hypertension  (ICD-401.9) 37)  Hyperlipidemia  (ICD-272.4) 38)  Diabetes Mellitus, Type II  (ICD-250.00) 39)  Congestive Heart Failure  (ICD-428.0)  Medications Prior to Update: 1)  Hydrocodone-Acetaminophen 10-325 Mg Tabs (Hydrocodone-Acetaminophen) .Marland Kitchen.. 1po Q 6 Hrs As Needed 2)  Enalapril Maleate 10 Mg Tabs (Enalapril Maleate) .... Take 1 Tablet By Mouth Once Daily 3)  Metformin Hcl 500 Mg Tb24 (Metformin Hcl) .... Take 4 Tablet By Mouth Once A Day 4)  Xanax 0.25 Mg Tabs (Alprazolam) .Marland Kitchen.. 1 By Mouth Two Times A Day As Needed 5)  Klor-Con M20 20 Meq  Tbcr (Potassium Chloride Crys  Cr) .... 1 By Mouth Qd 6)  Freestyle Test  Strp (Glucose Blood) .... Test Blood Glucose Three Times A Day 7)  Carvedilol 12.5 Mg Tabs (Carvedilol) .... Take One Tablet By Mouth Twice A Day 8)  Glimepiride 4 Mg  Tabs (Glimepiride) .Marland Kitchen.. 1 By Mouth Once Daily 9)  Aspirin 81 Mg Tbec (Aspirin) .... Take One Tablet By Mouth Daily 10)  Allopurinol 300 Mg Tabs (Allopurinol) .Marland Kitchen.. 1 By Mouth Once Daily 11)  Gabapentin 300 Mg  Caps (Gabapentin) .Marland Kitchen.. 1po Three Times A Day 12)  Study Drug 13)  Furosemide 80 Mg Tabs (Furosemide) .... Take One Tablet By  Mouth Daily Extra If Needed 14)  Stool Softener 100 Mg Caps (Docusate Sodium) .... Use Asd 15)  Triamcinolone Acetonide 0.5 % Crea (Triamcinolone Acetonide) .... Use Asd Two Times A Day As Needed 16)  Plavix 75 Mg Tabs (Clopidogrel Bisulfate) .... One By Mouth Daily  Current Medications (verified): 1)  Hydrocodone-Acetaminophen 10-325 Mg Tabs (Hydrocodone-Acetaminophen) .Marland Kitchen.. 1po Q 6 Hrs As Needed 2)  Enalapril Maleate 10 Mg Tabs (Enalapril Maleate) .... Take 1 Tablet By Mouth Once Daily 3)  Metformin Hcl 500 Mg Tb24 (Metformin Hcl) .... Take 4 Tablet By Mouth Once A Day 4)  Xanax 0.25 Mg Tabs (Alprazolam) .Marland Kitchen.. 1 By Mouth Two Times A Day As Needed 5)  Klor-Con M20 20 Meq  Tbcr (Potassium Chloride Crys Cr) .Marland Kitchen.. 1 By Mouth Qd 6)  Freestyle Test  Strp (Glucose Blood) .... Test Blood Glucose Three Times A Day 7)  Carvedilol 12.5 Mg Tabs (Carvedilol) .... Take One Tablet By Mouth Twice A Day 8)  Glimepiride 4 Mg  Tabs (Glimepiride) .Marland Kitchen.. 1 By Mouth Once Daily 9)  Aspirin 81 Mg Tbec (Aspirin) .... Take One Tablet By Mouth Daily 10)  Allopurinol 300 Mg Tabs (Allopurinol) .Marland Kitchen.. 1 By Mouth Once Daily 11)  Gabapentin 300 Mg  Caps (Gabapentin) .Marland Kitchen.. 1po Three Times A Day 12)  Study Drug 13)  Furosemide 80 Mg Tabs (Furosemide) .... Take One Tablet By Mouth Daily Extra If Needed 14)  Stool Softener 100 Mg Caps (Docusate Sodium) .... Use Asd 15)  Triamcinolone Acetonide 0.5 % Crea (Triamcinolone Acetonide) .... Use Asd Two Times A Day As Needed 16)  Plavix 75 Mg Tabs (Clopidogrel Bisulfate) .... One By Mouth Daily  Allergies (verified): 1)  ! * Crestor  Past History:  Past Medical History: Last updated: 08/30/2008 Diabetes mellitus, type II Obesity Depression Benign prostatic hypertrophy h/o prostate cancer Allergic rhinitis erectile dysfunction Gout  2. Coronary artery disease status post coronary bypass graft surgery     in 1992 and status post multiple percutaneous intervention with 40%      in-stent restenosis in the native right coronary and 70% in-stent     restenosis within the stent in the LAD supplied by the mammary     graft. 3. Good left ventricular function. 4. Diastolic heart failure 5. History of status post dual-mode, dual-pacing, dual-sensing     pacemaker implantation. 6. Hypertension  7. Hyperlipidemia.      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   Head:  normocephalic and atraumatic.   Eyes:  vision grossly intact, pupils equal, and pupils round.   Ears:  R ear normal and L ear normal.   Nose:  no external deformity and no nasal discharge.   Mouth:  no gingival abnormalities and pharynx pink and moist.   Neck:  supple, no masses, and no JVD.   Lungs:  normal respiratory effort and normal breath sounds.   Heart:  normal rate and regular rhythm.   Abdomen:  soft, non-tender, and normal bowel sounds.   Extremities:  no edema, no erythema    Impression & Recommendations:  Problem # 1:  DIZZINESS (ICD-780.4) near resolved with med changes with last visit - ok to re-start lasix at half dose - 40 mg staring thur july 21  Problem # 2:  HYPERTENSION (ICD-401.9)  His updated medication list for this problem includes:    Enalapril Maleate 10 Mg Tabs (Enalapril maleate) .Marland Kitchen... Take 1 tablet by mouth once daily    Carvedilol 12.5 Mg Tabs (Carvedilol) .Marland Kitchen... Take one tablet by mouth twice a day    Furosemide 80 Mg Tabs (Furosemide) .Marland Kitchen... Take one tablet by mouth  daily extra if needed  BP today: 112/64 Prior BP: 70/52 (08/13/2009)  Labs Reviewed: K+: 4.6 (08/13/2009) Creat: : 0.9 (08/13/2009)   Chol: 104 (03/09/2009)   HDL: 44.10 (03/09/2009)   LDL: 46 (03/09/2009)   TG: 69.0 (03/09/2009) stable overall by hx and exam, ok to continue meds/tx as is   - to cont the HALF coreg for now  Problem # 3:  HYPERLIPIDEMIA (ICD-272.4)  Labs Reviewed: SGOT: 28 (08/13/2009)   SGPT: 30 (08/13/2009)   HDL:44.10 (03/09/2009), 36.30 (10/22/2008)  LDL:46 (03/09/2009), 54 (10/22/2008)  Chol:104 (03/09/2009), 104 (10/22/2008)  Trig:69.0 (03/09/2009), 71.0 (10/22/2008) d/w pt - Pt to continue diet efforts, ; to check labs - goal LDL less than 70   Problem # 4:  DIABETES MELLITUS, TYPE II (ICD-250.00)  His updated medication list for this problem includes:    Enalapril Maleate 10 Mg Tabs (Enalapril maleate) .Marland Kitchen... Take 1 tablet by mouth once daily    Metformin Hcl 500 Mg Tb24 (Metformin hcl) .Marland Kitchen... Take 4 tablet by mouth once a day    Glimepiride 4 Mg Tabs (Glimepiride) .Marland Kitchen... 1 by mouth once daily    Aspirin 81 Mg Tbec (Aspirin) .Marland Kitchen... Take one tablet by mouth daily  Labs Reviewed: Creat: 0.9 (08/13/2009)    Reviewed HgBA1c results: 6.9 (08/13/2009)  6.8 (03/09/2009) stable overall by hx and exam, ok to continue meds/tx as is   Complete Medication List: 1)  Hydrocodone-acetaminophen 10-325 Mg Tabs (Hydrocodone-acetaminophen) .Marland Kitchen.. 1po q 6 hrs as needed 2)  Enalapril Maleate 10 Mg Tabs (Enalapril maleate) .... Take 1 tablet by mouth once daily 3)  Metformin Hcl 500 Mg Tb24 (Metformin hcl) .... Take 4 tablet by mouth once a day 4)  Xanax 0.25 Mg Tabs (Alprazolam) .Marland Kitchen.. 1 by mouth two times a day as needed 5)  Klor-con M20 20 Meq Tbcr (Potassium chloride crys cr) .Marland Kitchen.. 1 by mouth qd 6)  Freestyle Test Strp (Glucose blood) .... Test blood glucose three times a day 7)  Carvedilol 12.5 Mg Tabs (Carvedilol) .... Take one tablet by mouth twice a day 8)  Glimepiride 4 Mg  Tabs (Glimepiride) .Marland Kitchen.. 1 by mouth once daily 9)  Aspirin 81 Mg Tbec (Aspirin) .... Take one tablet by mouth daily 10)  Allopurinol 300 Mg Tabs (Allopurinol) .Marland Kitchen.. 1 by mouth once daily 11)  Gabapentin 300 Mg Caps (Gabapentin) .Marland Kitchen.. 1po three times a day 12)  Study Drug  13)  Furosemide 80 Mg Tabs (Furosemide) .... Take one tablet by mouth daily extra if needed 14)  Stool Softener 100 Mg Caps (Docusate sodium) .... Use asd 15)  Triamcinolone Acetonide 0.5 % Crea (Triamcinolone acetonide) .... Use asd two times a day as needed 16)  Plavix 75 Mg Tabs (Clopidogrel bisulfate) .... One by mouth daily  Patient Instructions: 1)  please take HALF of the lasix (furosemide) = 40 mg per day starting July 21 (thursday) 2)  Continue all previous medications as before this visit , including HALF of the coreg as you have been doing 3)  Please schedule a follow-up appointment in 1 month.

## 2010-03-01 NOTE — Procedures (Signed)
Summary: pacer check 31month per amber   Current Medications (verified): 1)  Hydrocodone-Acetaminophen 10-325 Mg Tabs (Hydrocodone-Acetaminophen) .Marland Kitchen.. 1po Q 6 Hrs As Needed 2)  Enalapril Maleate 10 Mg Tabs (Enalapril Maleate) .... Take 1 Tablet By Mouth Once Daily 3)  Metformin Hcl 500 Mg Tb24 (Metformin Hcl) .... Take 4 Tablet By Mouth Once A Day 4)  Xanax 0.25 Mg Tabs (Alprazolam) .Marland Kitchen.. 1 By Mouth Two Times A Day As Needed 5)  Klor-Con M20 20 Meq  Tbcr (Potassium Chloride Crys Cr) .Marland Kitchen.. 1 By Mouth Qd 6)  Freestyle Test  Strp (Glucose Blood) .... Test Blood Glucose Three Times A Day 7)  Carvedilol 12.5 Mg Tabs (Carvedilol) .... Take One Tablet By Mouth Twice A Day 8)  Glimepiride 4 Mg  Tabs (Glimepiride) .Marland Kitchen.. 1 By Mouth Once Daily 9)  Aspirin 81 Mg Tbec (Aspirin) .... Take One Tablet By Mouth Daily 10)  Allopurinol 300 Mg Tabs (Allopurinol) .Marland Kitchen.. 1 By Mouth Once Daily 11)  Gabapentin 300 Mg  Caps (Gabapentin) .Marland Kitchen.. 1po Three Times A Day 12)  Study Drug 13)  Furosemide 80 Mg Tabs (Furosemide) .... Take One Tablet By Mouth Daily Extra If Needed 14)  Stool Softener 100 Mg Caps (Docusate Sodium) .... Use Asd 15)  Triamcinolone Acetonide 0.5 % Crea (Triamcinolone Acetonide) .... Use Asd Two Times A Day As Needed 16)  Plavix 75 Mg Tabs (Clopidogrel Bisulfate) .... One By Mouth Daily  Allergies (verified): 1)  ! * Crestor   PPM Specifications Following MD:  Everardo Beals. Juanda Chance, MD     PPM Vendor:  [employer]     PPM Model Number:  [id number]     PPM Serial Number:  [id number] PPM DOI:  08/28/2001      Lead 1    Location: [facility]     DOI: 08/28/2001     Model #: [id number]     Serial #: [id number]     Status: active Lead 2    Location: [facility]     DOI: 08/28/2001     Model #: [id number]     Serial #: [id number]     Status: active   Indications:  2 Av Block   PPM Follow Up Remote Check?  No Battery Voltage:  2.65 V     Battery Est. Longevity:  9 months     Pacer Dependent:  Yes       PPM Device Measurements [employer]   Amplitude: 5.6 mV, Impedance: 549 ohms, Threshold: 0.75 V at 0.4 msec Right Ventricle  Impedance: 525 ohms, Threshold: 1.0 V at 0.4 msec  Episodes MS Episodes:  3     Percent Mode Switch:  <0.1%     Coumadin:  No Ventricular High Rate:  2     Atrial Pacing:  27.8%     Ventricular Pacing:  100%  Parameters Mode:  DDD     Lower Rate Limit:  60     Upper Rate Limit:  130 Paced AV Delay:  150     Sensed AV Delay:  120 Next Cardiology Appt Due:  12/30/2009 Tech Comments:  No parameter changes.  Device function normal.  Battery near ERI, estimated longevity 9 months.  There were 2 VHR episodes both only lasting  2 seconds.  ROV 6 months with Dr. Juanda Chance. Altha Harm, LPN  July 06, 9627 10:15 AM

## 2010-03-01 NOTE — Assessment & Plan Note (Signed)
Summary: neck and head pain-lb   Vital Signs:  Patient profile:   65 year old male Height:      71 inches Weight:      233.25 pounds BMI:     32.65 O2 Sat:      94 % on Room air Temp:     97.7 degrees F oral Pulse rate:   59 / minute BP sitting:   100 / 62  (left arm) Cuff size:   large  Vitals Entered By: Zella Ball Ewing CMA (AAMA) (January 03, 2010 11:14 AM)  O2 Flow:  Room air  CC: neck and head pain/RE   Primary Care Marrian Bells:  Corwin Levins MD  CC:  neck and head pain/RE.  History of Present Illness: here wtih c/o intermittent one month pain mild to mod to the left post lat neck with a type of swelling off and on, sharp and dull, radiating to the left head above the ear without swelling, rash, hearing loss, blurred vision , and Pt denies CP, worsening sob, doe, wheezing, orthopnea, pnd, worsening LE edema, palps, dizziness or syncope  Pt denies new neuro symptoms such as other headache, facial or extremity weakness  .  Pt denies polydipsia, polyuria, or low sugar symptoms such as shakiness improved with eating.  Overall good compliance with meds, trying to follow low chol, DM diet, wt stable, little excercise however CBG's average 120 in the past month.  Overall good compliance with meds, and good tolerability.  No fever, wt loss, night sweats, loss of appetite or other constitutional symptoms Overall good compliance with meds, and good tolerability.  No other neck pain, bowel or bladder change, gait change or fall.   Neck pain somewhat worse to bending the head forward and to the right, heat helps, and massage of the area helps as well.  Preventive Screening-Counseling & Management      Drug Use:  no.    Problems Prior to Update: 1)  Neck Pain, Left  (ICD-723.1) 2)  Dizziness  (ICD-780.4) 3)  Dyspnea On Exertion  (ICD-786.09) 4)  Fatigue, Acute  (ICD-780.79) 5)  Preventive Health Care  (ICD-V70.0) 6)  Psa, Increased  (ICD-790.93) 7)  Special Screening Malig Neoplasms Other  Sites  (ICD-V76.49) 8)  Fatigue  (ICD-780.79) 9)  Acute Gouty Arthropathy  (ICD-274.01) 10)  Peripheral Edema  (ICD-782.3) 11)  Laceration, Finger  (ICD-883.0) 12)  Encounter For Long-term Use of Other Medications  (ICD-V58.69) 13)  Atrioventricular Block, Complete  (ICD-426.0) 14)  Dyspnea  (ICD-786.05) 15)  Diastolic Heart Failure, Chronic  (ICD-428.32) 16)  Leg Pain, Bilateral  (ICD-729.5) 17)  Knee Pain, Bilateral  (ICD-719.46) 18)  Acute Gouty Arthropathy  (ICD-274.01) 19)  Pacemaker, Permanent  (ICD-V45.01) 20)  Cad, Autologous Bypass Graft  (ICD-414.02) 21)  Insomnia-sleep Disorder-unspec  (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) 22)  Lumbar Radiculopathy, Right  (ICD-724.4) 23)  Special Screening Malignant Neoplasm of Prostate  (ICD-V76.44) 24)  Gout  (ICD-274.9) 25)  Fatigue  (ICD-780.79) 26)  Ankle Sprain  (ICD-845.00) 27)  Rash-nonvesicular  (ICD-782.1) 28)  Erectile Dysfunction  (ICD-302.72) 29)  Allergic Rhinitis  (ICD-477.9) 30)  Neoplasm, Malignant, Prostate  (ICD-185) 31)  Family History Diabetes 1st Degree Relative  (ICD-V18.0) 32)  Family History of Cad Male 1st Degree Relative <50  (ICD-V17.3) 33)  Coronary Artery Disease  (ICD-414.00) 34)  Benign Prostatic Hypertrophy  (ICD-600.00) 35)  Depression  (ICD-311) 36)  Overweight  (ICD-278.02) 37)  Hypertension  (ICD-401.9) 38)  Hyperlipidemia  (ICD-272.4) 39)  Diabetes Mellitus,  Type II  (ICD-250.00) 40)  Congestive Heart Failure  (ICD-428.0)  Medications Prior to Update: 1)  Hydrocodone-Acetaminophen 10-325 Mg Tabs (Hydrocodone-Acetaminophen) .Marland Kitchen.. 1po Q 6 Hrs As Needed 2)  Enalapril Maleate 10 Mg Tabs (Enalapril Maleate) .... Take 1 Tablet By Mouth Two Times A Day 3)  Metformin Hcl 500 Mg Tb24 (Metformin Hcl) .... Take 4 Tablet By Mouth Once A Day 4)  Xanax 0.25 Mg Tabs (Alprazolam) .Marland Kitchen.. 1 By Mouth Two Times A Day As Needed 5)  Klor-Con M20 20 Meq  Tbcr (Potassium Chloride Crys Cr) .Marland Kitchen.. 1 By Mouth Qd 6)  Freestyle Test  Strp (Glucose  Blood) .... Test Blood Glucose Three Times A Day 7)  Carvedilol 12.5 Mg Tabs (Carvedilol) .... Take One Tablet By Mouth Twice A Day 8)  Glimepiride 4 Mg  Tabs (Glimepiride) .Marland Kitchen.. 1 By Mouth Once Daily 9)  Aspirin 81 Mg Tbec (Aspirin) .... Take One Tablet By Mouth Daily 10)  Allopurinol 300 Mg Tabs (Allopurinol) .Marland Kitchen.. 1 By Mouth Once Daily 11)  Gabapentin 300 Mg  Caps (Gabapentin) .Marland Kitchen.. 1po Three Times A Day 12)  Improve-It Study Drug .... As Directed 13)  Furosemide 40 Mg Tabs (Furosemide) .... Take One Tablet By Mouth Daily (Take An Extra Tab If Pt Needs It For Fluid) 14)  Triamcinolone Acetonide 0.5 % Crea (Triamcinolone Acetonide) .... Use Asd Two Times A Day As Needed  Current Medications (verified): 1)  Hydrocodone-Acetaminophen 10-325 Mg Tabs (Hydrocodone-Acetaminophen) .Marland Kitchen.. 1po Q 6 Hrs As Needed 2)  Enalapril Maleate 10 Mg Tabs (Enalapril Maleate) .... Take 1 Tablet By Mouth Two Times A Day 3)  Metformin Hcl 500 Mg Tb24 (Metformin Hcl) .... Take 4 Tablet By Mouth Once A Day 4)  Xanax 0.25 Mg Tabs (Alprazolam) .Marland Kitchen.. 1 By Mouth Two Times A Day As Needed 5)  Klor-Con M20 20 Meq  Tbcr (Potassium Chloride Crys Cr) .Marland Kitchen.. 1 By Mouth Qd 6)  Freestyle Test  Strp (Glucose Blood) .... Test Blood Glucose Three Times A Day 7)  Carvedilol 12.5 Mg Tabs (Carvedilol) .... Take One Tablet By Mouth Twice A Day 8)  Glimepiride 4 Mg  Tabs (Glimepiride) .Marland Kitchen.. 1 By Mouth Once Daily 9)  Aspirin 81 Mg Tbec (Aspirin) .... Take One Tablet By Mouth Daily 10)  Allopurinol 300 Mg Tabs (Allopurinol) .Marland Kitchen.. 1 By Mouth Once Daily 11)  Gabapentin 300 Mg  Caps (Gabapentin) .Marland Kitchen.. 1po Three Times A Day 12)  Improve-It Study Drug .... As Directed 13)  Furosemide 40 Mg Tabs (Furosemide) .... Take One Tablet By Mouth Daily (Take An Extra Tab If Pt Needs It For Fluid) 14)  Triamcinolone Acetonide 0.5 % Crea (Triamcinolone Acetonide) .... Use Asd Two Times A Day As Needed 15)  Flexeril 5 Mg Tabs (Cyclobenzaprine Hcl) .Marland Kitchen.. 1 By Mouth  Three Times A Day As Needed  Allergies (verified): 1)  ! * Crestor  Past History:  Past Medical History: Last updated: 08/30/2008 Diabetes mellitus, type II Obesity Depression Benign prostatic hypertrophy h/o prostate cancer Allergic rhinitis erectile dysfunction Gout  2. Coronary artery disease status post coronary bypass graft surgery     in 1992 and status post multiple percutaneous intervention with 40%     in-stent restenosis in the native right coronary and 70% in-stent     restenosis within the stent in the LAD supplied by the mammary     graft. 3. Good left ventricular function. 4. Diastolic heart failure 5. History of status post dual-mode, dual-pacing, dual-sensing     pacemaker  implantation. 6. Hypertension  7. Hyperlipidemia.      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   Head:  normocephalic and atraumatic.  , no sweling, erythema or rash Eyes:  vision grossly intact, pupils equal, and pupils round.   Ears:  R ear normal and L ear normal.   Nose:  no external deformity and no nasal discharge.   Mouth:  no gingival abnormalities and pharynx pink and moist.   Neck:  supple and no masses.   Lungs:  normal respiratory effort and normal breath sounds.   Heart:  normal rate and regular rhythm.   Abdomen:   soft, non-tender, and normal bowel sounds.   Msk:  no joint tenderness and no joint swelling.  , neck FROM, NT , no rash and nontender including the left postlat neck, head and periaural area Extremities:  no edema, no erythema  Neurologic:  cranial nerves II-XII intact, strength normal in all extremities, and gait normal.     Impression & Recommendations:  Problem # 1:  NECK PAIN, LEFT (ICD-723.1)  His updated medication list for this problem includes:    Hydrocodone-acetaminophen 10-325 Mg Tabs (Hydrocodone-acetaminophen) .Marland Kitchen... 1po q 6 hrs as needed    Aspirin 81 Mg Tbec (Aspirin) .Marland Kitchen... Take one tablet by mouth daily    Flexeril 5 Mg Tabs (Cyclobenzaprine hcl) .Marland Kitchen... 1 by mouth three times a day as needed treat as above, f/u any worsening signs or symptoms ; diff includes MSK (most likely it seems), underlying DDD/DJD, or even occipital neuralgia or other;  consider increasing the gabapentin or other pain med  Problem # 2:  DIABETES MELLITUS, TYPE II (ICD-250.00)  His updated medication list for this problem includes:    Enalapril Maleate 10 Mg Tabs (Enalapril maleate) .Marland Kitchen... Take 1 tablet by mouth two times a day    Metformin Hcl 500 Mg Tb24 (Metformin hcl) .Marland Kitchen... Take 4 tablet by mouth once a day    Glimepiride 4 Mg Tabs (Glimepiride) .Marland Kitchen... 1 by mouth once daily    Aspirin 81 Mg Tbec (Aspirin) .Marland Kitchen... Take one tablet by mouth daily  Labs Reviewed: Creat: 0.8 (10/27/2009)    Reviewed HgBA1c results: 6.9 (08/13/2009)  6.8 (03/09/2009) stable overall by hx and exam, ok to continue meds/tx as is, Pt to cont DM diet, excercise, wt control efforts; to check labs today  Orders: TLB-BMP (Basic Metabolic Panel-BMET) (80048-METABOL) TLB-A1C / Hgb A1C (Glycohemoglobin) (83036-A1C) TLB-Lipid Panel (80061-LIPID)  Problem # 3:  HYPERTENSION (ICD-401.9)  His updated medication list for this problem includes:    Enalapril Maleate 10 Mg Tabs (Enalapril maleate) .Marland Kitchen... Take 1 tablet by mouth two  times a day    Carvedilol 12.5 Mg Tabs (Carvedilol) .Marland Kitchen... Take one tablet by mouth twice a day    Furosemide 40 Mg Tabs (Furosemide) .Marland Kitchen... Take one tablet by mouth daily (take an extra tab  if pt needs it for fluid)  BP today: 100/62 Prior BP: 140/87 (11/16/2009)  Labs Reviewed: K+: 4.5 (10/27/2009) Creat: : 0.8 (10/27/2009)   Chol: 104 (03/09/2009)   HDL: 44.10 (03/09/2009)   LDL: 46 (03/09/2009)   TG: 69.0 (03/09/2009) stable overall by hx and exam, ok to continue meds/tx as is   Problem # 4:  HYPERLIPIDEMIA (ICD-272.4)  Labs Reviewed: SGOT: 28 (08/13/2009)   SGPT: 30 (08/13/2009)   HDL:44.10 (03/09/2009), 36.30 (10/22/2008)  LDL:46 (03/09/2009), 54 (10/22/2008)  Chol:104 (03/09/2009), 104 (10/22/2008)  Trig:69.0 (03/09/2009), 71.0 (10/22/2008) stable overall by hx and exam, ok to continue meds/tx as is - no med needed at this time, goal LDL < 70  Complete Medication List: 1)  Hydrocodone-acetaminophen 10-325 Mg Tabs (Hydrocodone-acetaminophen) .Marland Kitchen.. 1po q 6 hrs as needed 2)  Enalapril Maleate 10 Mg Tabs (Enalapril maleate) .... Take 1 tablet by mouth two times a day 3)  Metformin Hcl 500 Mg Tb24 (Metformin hcl) .... Take 4 tablet by mouth once a day 4)  Xanax 0.25 Mg Tabs (Alprazolam) .Marland Kitchen.. 1 by mouth two times a day as needed 5)  Klor-con M20 20 Meq Tbcr (Potassium chloride crys cr) .Marland Kitchen.. 1 by mouth qd 6)  Freestyle Test Strp (Glucose blood) .... Test blood glucose three times a day 7)  Carvedilol 12.5 Mg Tabs (Carvedilol) .... Take one tablet by mouth twice a day 8)  Glimepiride 4 Mg Tabs (Glimepiride) .Marland Kitchen.. 1 by mouth once daily 9)  Aspirin 81 Mg Tbec (Aspirin) .... Take one tablet by mouth daily 10)  Allopurinol 300 Mg Tabs (Allopurinol) .Marland Kitchen.. 1 by mouth once daily 11)  Gabapentin 300 Mg Caps (Gabapentin) .Marland Kitchen.. 1po three times a day 12)  Improve-it Study Drug  .... As directed 13)  Furosemide 40 Mg Tabs (Furosemide) .... Take one tablet by mouth daily (take an extra tab if pt needs it  for fluid) 14)  Triamcinolone Acetonide 0.5 % Crea (Triamcinolone acetonide) .... Use asd two times a day as needed 15)  Flexeril 5 Mg Tabs (Cyclobenzaprine hcl) .Marland Kitchen.. 1 by mouth three times a day as needed  Patient Instructions: 1)  Please take all new medications as prescribed 2)  Continue all previous medications as before this visit  3)  Please go to the Lab in the basement for your blood and/or urine tests today 4)  Please schedule a follow-up appointment in 6 months, or sooner if needed Prescriptions: FLEXERIL 5 MG TABS (CYCLOBENZAPRINE HCL) 1 by mouth three times a day as needed  #60 x 1   Entered and Authorized by:   Corwin Levins MD   Signed by:   Corwin Levins MD on 01/03/2010   Method used:   Print then Give to Patient   RxID:   [id number]    Orders Added: 1)  TLB-BMP (Basic Metabolic Panel-BMET) [80048-METABOL] 2)  TLB-A1C / Hgb A1C (Glycohemoglobin) [83036-A1C] 3)  TLB-Lipid Panel [80061-LIPID] 4)  Est. Patient Level IV [28413]

## 2010-03-01 NOTE — Assessment & Plan Note (Signed)
Summary: abd pain/very tired/cd   Vital Signs:  Patient profile:   65 year old male Height:      71 inches (180.34 cm) Weight:      232 pounds (105.45 kg) BMI:     32.47 O2 Sat:      96 % on Room air Temp:     97.6 degrees F (36.44 degrees C) oral Pulse rate:   65 / minute Pulse rhythm:   regular Resp:     16 per minute BP supine:   100 / 70 BP sitting:   90 / 60  (right arm) BP standing:   70 / 52 Cuff size:   large  Vitals Entered By: Lanier Prude, CMA(AAMA) (August 13, 2009 11:28 AM)  O2 Flow:  Room air CC: Fatigue/weakness and "jittery stomach" x 5 days Is Patient Diabetic? Yes Did you bring your meter with you today? No Comments pt is not taking Plavix   Primary Care Provider:  Corwin Levins MD  CC:  Fatigue/weakness and "jittery stomach" x 5 days.  History of Present Illness: here to /f/u - c/o general weakness, seemed to stat with driving home from church with the windows open in the heat last sunday;  cbg's OK at home - seems to ave 130;  only other c/o besides general weakness (can walk from the from lobby to the exam approx 75 ft and "feels like I walked 3 to 3 miles)  - seems to have to stop to rest due to fatigue;  has some mild SOB as well;  Pt denies CP,  worsening wheezing, worsening orthopnea, pnd, worsening LE edema, palps, dizziness or syncope .  Cant walk the dog - can only take the dog out to potty and back in before too much weakness in the past 5 days;  no fever, chills, sweats, wt loss; c/o being nervous , wants to even go in the hospital if needed;  and has "nervous abdomen" with feeling as when he has been nervous in the past; no signifcant abd Pain per se;  no n/v,  bowel or bladder change, joint  pain, or overt bleeding.  Does not seem dizzy to first stand up, but has some dizzy in a few minutes.  No ST, or cough. No falls or injury.     Problems Prior to Update: 1)  Dizziness  (ICD-780.4) 2)  Dyspnea On Exertion  (ICD-786.09) 3)  Fatigue, Acute   (ICD-780.79) 4)  Preventive Health Care  (ICD-V70.0) 5)  Psa, Increased  (ICD-790.93) 6)  Special Screening Malig Neoplasms Other Sites  (ICD-V76.49) 7)  Fatigue  (ICD-780.79) 8)  Acute Gouty Arthropathy  (ICD-274.01) 9)  Peripheral Edema  (ICD-782.3) 10)  Laceration, Finger  (ICD-883.0) 11)  Encounter For Long-term Use of Other Medications  (ICD-V58.69) 12)  Atrioventricular Block, Complete  (ICD-426.0) 13)  Dyspnea  (ICD-786.05) 14)  Diastolic Heart Failure, Chronic  (ICD-428.32) 15)  Leg Pain, Bilateral  (ICD-729.5) 16)  Knee Pain, Bilateral  (ICD-719.46) 17)  Acute Gouty Arthropathy  (ICD-274.01) 18)  Pacemaker  (ICD-V45.Marland Kitchen01) 19)  Cad, Autologous Bypass Graft  (ICD-414.02) 20)  Insomnia-sleep Disorder-unspec  (ICD-780.52) 21)  Lumbar Radiculopathy, Right  (ICD-724.4) 22)  Special Screening Malignant Neoplasm of Prostate  (ICD-V76.44) 23)  Gout  (ICD-274.9) 24)  Fatigue  (ICD-780.79) 25)  Ankle Sprain  (ICD-845.00) 26)  Rash-nonvesicular  (ICD-782.1) 27)  Erectile Dysfunction  (ICD-302.72) 28)  Allergic Rhinitis  (ICD-477.9) 29)  Neoplasm, Malignant, Prostate  (ICD-185) 30)  Family History Diabetes  1st Degree Relative  (ICD-V18.0) 31)  Family History of Cad Male 1st Degree Relative <50  (ICD-V17.3) 32)  Coronary Artery Disease  (ICD-414.00) 33)  Benign Prostatic Hypertrophy  (ICD-600.00) 34)  Depression  (ICD-311) 35)  Overweight  (ICD-278.02) 36)  Hypertension  (ICD-401.9) 37)  Hyperlipidemia  (ICD-272.4) 38)  Diabetes Mellitus, Type II  (ICD-250.00) 39)  Congestive Heart Failure  (ICD-428.0)  Medications Prior to Update: 1)  Hydrocodone-Acetaminophen 10-325 Mg Tabs (Hydrocodone-Acetaminophen) .Marland Kitchen.. 1po Q 6 Hrs As Needed 2)  Enalapril Maleate 10 Mg Tabs (Enalapril Maleate) .... Take 1 Tablet By Mouth Once Daily 3)  Metformin Hcl 500 Mg Tb24 (Metformin Hcl) .... Take 4 Tablet By Mouth Once A Day 4)  Xanax 0.25 Mg Tabs (Alprazolam) .Marland Kitchen.. 1 By Mouth Two Times A Day As  Needed 5)  Klor-Con M20 20 Meq  Tbcr (Potassium Chloride Crys Cr) .Marland Kitchen.. 1 By Mouth Qd 6)  Freestyle Test  Strp (Glucose Blood) .... Test Blood Glucose Three Times A Day 7)  Carvedilol 12.5 Mg Tabs (Carvedilol) .... Take One Tablet By Mouth Twice A Day 8)  Glimepiride 4 Mg  Tabs (Glimepiride) .Marland Kitchen.. 1 By Mouth Once Daily 9)  Aspirin 81 Mg Tbec (Aspirin) .... Take One Tablet By Mouth Daily 10)  Allopurinol 300 Mg Tabs (Allopurinol) .Marland Kitchen.. 1 By Mouth Once Daily 11)  Gabapentin 300 Mg  Caps (Gabapentin) .Marland Kitchen.. 1po Three Times A Day 12)  Study Drug 13)  Furosemide 80 Mg Tabs (Furosemide) .... Take One Tablet By Mouth Daily Extra If Needed 14)  Stool Softener 100 Mg Caps (Docusate Sodium) .... Use Asd 15)  Triamcinolone Acetonide 0.5 % Crea (Triamcinolone Acetonide) .... Use Asd Two Times A Day As Needed 16)  Plavix 75 Mg Tabs (Clopidogrel Bisulfate) .... One By Mouth Daily  Current Medications (verified): 1)  Hydrocodone-Acetaminophen 10-325 Mg Tabs (Hydrocodone-Acetaminophen) .Marland Kitchen.. 1po Q 6 Hrs As Needed 2)  Enalapril Maleate 10 Mg Tabs (Enalapril Maleate) .... Take 1 Tablet By Mouth Once Daily 3)  Metformin Hcl 500 Mg Tb24 (Metformin Hcl) .... Take 4 Tablet By Mouth Once A Day 4)  Xanax 0.25 Mg Tabs (Alprazolam) .Marland Kitchen.. 1 By Mouth Two Times A Day As Needed 5)  Klor-Con M20 20 Meq  Tbcr (Potassium Chloride Crys Cr) .Marland Kitchen.. 1 By Mouth Qd 6)  Freestyle Test  Strp (Glucose Blood) .... Test Blood Glucose Three Times A Day 7)  Carvedilol 12.5 Mg Tabs (Carvedilol) .... Take One Tablet By Mouth Twice A Day 8)  Glimepiride 4 Mg  Tabs (Glimepiride) .Marland Kitchen.. 1 By Mouth Once Daily 9)  Aspirin 81 Mg Tbec (Aspirin) .... Take One Tablet By Mouth Daily 10)  Allopurinol 300 Mg Tabs (Allopurinol) .Marland Kitchen.. 1 By Mouth Once Daily 11)  Gabapentin 300 Mg  Caps (Gabapentin) .Marland Kitchen.. 1po Three Times A Day 12)  Study Drug 13)  Furosemide 80 Mg Tabs (Furosemide) .... Take One Tablet By Mouth Daily Extra If Needed 14)  Stool Softener 100 Mg Caps  (Docusate Sodium) .... Use Asd 15)  Triamcinolone Acetonide 0.5 % Crea (Triamcinolone Acetonide) .... Use Asd Two Times A Day As Needed 16)  Plavix 75 Mg Tabs (Clopidogrel Bisulfate) .... One By Mouth Daily  Allergies (verified): 1)  ! * Crestor  Past History:  Past Medical History: Last updated: 08/30/2008 Diabetes mellitus, type II Obesity Depression Benign prostatic hypertrophy h/o prostate cancer Allergic rhinitis erectile dysfunction Gout  2. Coronary artery disease status post coronary bypass graft surgery     in 1992 and status  post multiple percutaneous intervention with 40%     in-stent restenosis in the native right coronary and 70% in-stent     restenosis within the stent in the LAD supplied by the mammary     graft. 3. Good left ventricular function. 4. Diastolic heart failure 5. History of status post dual-mode, dual-pacing, dual-sensing     pacemaker implantation. 6. Hypertension  7. Hyperlipidemia.      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  , fatigued appearing but able to ascend the exam table Head:  normocephalic and atraumatic.   Eyes:  vision grossly intact, pupils equal, and pupils round.   Ears:  R ear normal and L ear normal.   Nose:  no external deformity and no nasal discharge.   Mouth:  no gingival abnormalities and pharynx pink and moist.   Neck:  supple and no masses.   Lungs:  normal respiratory effort and normal breath sounds.   Heart:   normal rate and regular rhythm.   Abdomen:  soft, non-tender, and normal bowel sounds.   Msk:  no joint tenderness and no joint swelling.   Extremities:  no edema, no erythema  Neurologic:  cranial nerves II-XII intact, strength normal in all extremities, and gait normal.     Impression & Recommendations:  Problem # 1:  FATIGUE, ACUTE (ICD-780.79)  x 5 days acutely without fever or pain;  exam benign except for below, to check labs below; follow with expectant management   Orders: TLB-BMP (Basic Metabolic Panel-BMET) (80048-METABOL) TLB-CBC Platelet - w/Differential (85025-CBCD) TLB-Hepatic/Liver Function Pnl (80076-HEPATIC) TLB-TSH (Thyroid Stimulating Hormone) (84443-TSH) TLB-Sedimentation Rate (ESR) (85652-ESR) TLB-Udip ONLY (81003-UDIP)  Problem # 2:  DYSPNEA ON EXERTION (ICD-786.09)  His updated medication list for this problem includes:    Enalapril Maleate 10 Mg Tabs (Enalapril maleate) .Marland Kitchen... Take 1 tablet by mouth once daily    Carvedilol 12.5 Mg Tabs (Carvedilol) .Marland Kitchen... Take one tablet by mouth twice a day    Furosemide 80 Mg Tabs (Furosemide) .Marland Kitchen... Take one tablet by mouth daily extra if needed with exertion only - for cxr , ecg  Orders: EKG w/ Interpretation (93000) T-2 View CXR, Same Day (71020.5TC)  Problem # 3:  DIZZINESS (ICD-780.4) most likely due to orthostasis /dehydration - to hold the furosemide , and take HALF coreg until returns in 4 days, to drink more fluids  Problem # 4:  DIASTOLIC HEART FAILURE, CHRONIC (ICD-428.32)  His updated medication list for this problem includes:    Enalapril Maleate 10 Mg Tabs (Enalapril maleate) .Marland Kitchen... Take 1 tablet by mouth once daily    Carvedilol 12.5 Mg Tabs (Carvedilol) .Marland Kitchen... Take one tablet by mouth twice a day    Aspirin 81 Mg Tbec (Aspirin) .Marland Kitchen... Take one tablet by mouth daily    Furosemide 80 Mg Tabs (Furosemide) .Marland Kitchen... Take one tablet by mouth daily extra if needed    Plavix 75 Mg Tabs (Clopidogrel bisulfate) .....  One by mouth daily o/w stable except for the above  Complete Medication List: 1)  Hydrocodone-acetaminophen 10-325 Mg Tabs (Hydrocodone-acetaminophen) .Marland Kitchen.. 1po q 6 hrs as needed 2)  Enalapril Maleate 10 Mg Tabs (Enalapril maleate) .... Take 1 tablet by mouth once daily 3)  Metformin Hcl 500 Mg Tb24 (Metformin hcl) .... Take 4 tablet by mouth once a day 4)  Xanax 0.25 Mg Tabs (Alprazolam) .Marland Kitchen.. 1 by mouth two times a day as needed 5)  Klor-con M20 20 Meq Tbcr (Potassium chloride crys cr) .Marland Kitchen.. 1 by mouth qd 6)  Freestyle Test Strp (Glucose blood) .... Test blood glucose three times a day 7)  Carvedilol 12.5 Mg Tabs (Carvedilol) .... Take one tablet by mouth twice a day 8)  Glimepiride 4 Mg Tabs (Glimepiride) .Marland Kitchen.. 1 by mouth once daily 9)  Aspirin 81 Mg Tbec (Aspirin) .... Take one tablet by mouth daily 10)  Allopurinol 300 Mg Tabs (Allopurinol) .Marland Kitchen.. 1 by mouth once daily 11)  Gabapentin 300 Mg Caps (Gabapentin) .Marland Kitchen.. 1po three times a day 12)  Study Drug  13)  Furosemide 80 Mg Tabs (Furosemide) .... Take one tablet by mouth daily extra if needed 14)  Stool Softener 100 Mg Caps (Docusate sodium) .... Use asd 15)  Triamcinolone Acetonide 0.5 % Crea (Triamcinolone acetonide) .... Use asd two times a day as needed 16)  Plavix 75 Mg Tabs (Clopidogrel bisulfate) .... One by mouth daily  Other Orders: TLB-A1C / Hgb A1C (Glycohemoglobin) (83036-A1C)  Patient Instructions: 1)  You have dehydration today (the blood pressure falls very low when you stand up) 2)  Stop the furosemide until further notice 3)  Take HALF of the Carvedilol  4)  Your EKG was ok today 5)  Please go to Radiology in the basement level for your X-Ray today  6)  Please go to the Lab in the basement for your blood and/or urine tests today 7)  Please schedule a follow-up appointment in Next Tuesday 8)  In the next few days, you SHOULD DRINK more fluids than usual since it appers we have overdone the lasix recently for the amount  of fluids you have been taken in, and a bit of extra salt in the food is ok just for the next few days

## 2010-03-01 NOTE — Letter (Signed)
Summary: CMN for Diabetes Supplies/[facility]  CMN for Diabetes Supplies/[facility]   Imported By: Sherian Rein 05/31/2009 13:07:00  _____________________________________________________________________  External Attachment:    Type:   Image     Comment:   External Document

## 2010-03-01 NOTE — Assessment & Plan Note (Signed)
Summary: 6 MO ROV /NWS #   Vital Signs:  Patient profile:   65 year old male Height:      71 inches Weight:      228.50 pounds BMI:     31.98 O2 Sat:      97 % on Room air Temp:     97.7 degrees F oral Pulse rate:   77 / minute BP sitting:   118 / 70  (left arm) Cuff size:   large  Vitals Entered ByZella Ball Ewing (May 06, 2009 1:49 PM)  O2 Flow:  Room air CC: 6 Mo ROV/RE   Primary Care Provider:  Corwin Levins MD  CC:  6 Mo ROV/RE.  History of Present Illness: overall donig well;  Pt denies CP, sob, doe, wheezing, orthopnea, pnd, worsening LE edema, palps, dizziness or syncope   Pt denies new neuro symptoms such as headache, facial or extremity weakness   Pt denies polydipsia, polyuria, or low sugar symptoms such as shakiness improved with eating.  Overall good compliance with meds, trying to follow low chol, DM diet, wt stable, little excercise however   Needs refills steroid cream for occas rash to the arms with itch.  No fever, st, cough or wheezing.   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) 10)  Dyspnea  (ICD-786.05) 11)  Diastolic Heart Failure, Chronic  (ICD-428.32) 12)  Leg Pain, Bilateral  (ICD-729.5) 13)  Knee Pain, Bilateral  (ICD-719.46) 14)  Acute Gouty Arthropathy  (ICD-274.01) 15)  Pacemaker  (ICD-V45.Marland Kitchen01) 16)  Cad, Autologous Bypass Graft  (ICD-414.02) 17)  Insomnia-sleep Disorder-unspec  (ICD-780.52) 18)  Lumbar Radiculopathy, Right  (ICD-724.4) 19)  Special Screening Malignant Neoplasm of Prostate  (ICD-V76.44) 20)  Gout  (ICD-274.9) 21)  Fatigue  (ICD-780.79) 22)  Ankle Sprain   (ICD-845.00) 23)  Rash-nonvesicular  (ICD-782.1) 24)  Erectile Dysfunction  (ICD-302.72) 25)  Allergic Rhinitis  (ICD-477.9) 26)  Neoplasm, Malignant, Prostate  (ICD-185) 27)  Family History Diabetes 1st Degree Relative  (ICD-V18.0) 28)  Family History of Cad Male 1st Degree Relative <50  (ICD-V17.3) 29)  Coronary Artery Disease  (ICD-414.00) 30)  Benign Prostatic Hypertrophy  (ICD-600.00) 31)  Depression  (ICD-311) 32)  Overweight  (ICD-278.02) 33)  Hypertension  (ICD-401.9) 34)  Hyperlipidemia  (ICD-272.4) 35)  Diabetes Mellitus, Type II  (ICD-250.00) 36)  Congestive Heart Failure  (ICD-428.0)  Medications Prior to Update: 1)  Hydrocodone-Acetaminophen 10-325 Mg Tabs (Hydrocodone-Acetaminophen) .Marland Kitchen.. 1po Q 6 Hrs As Needed 2)  Enalapril Maleate 10 Mg Tabs (Enalapril Maleate) .... Take 1 Tablet By Mouth Once Daily 3)  Metformin Hcl 500 Mg Tb24 (Metformin Hcl) .... Take 4 Tablet By Mouth Once A Day 4)  Xanax 0.25 Mg Tabs (Alprazolam) .Marland Kitchen.. 1 By Mouth Two Times A Day As Needed 5)  Klor-Con M20 20 Meq  Tbcr (Potassium Chloride Crys Cr) .Marland Kitchen.. 1 By Mouth Qd 6)  Triamcinolone Acetonide 0.5 %  Crea (Triamcinolone Acetonide) .... Use Asd Two Times A Day Prn 7)  Freestyle Test  Strp (Glucose Blood) .... Test Blood Glucose Three Times A Day 8)  Carvedilol 12.5 Mg Tabs (Carvedilol) .... Take  One Tablet By Mouth Twice A Day 9)  Glimepiride 4 Mg  Tabs (Glimepiride) .Marland Kitchen.. 1 By Mouth Once Daily 10)  Aspirin 81 Mg Tbec (Aspirin) .... Take One Tablet By Mouth Daily 11)  Allopurinol 300 Mg Tabs (Allopurinol) .Marland Kitchen.. 1 By Mouth Once Daily 12)  Gabapentin 300 Mg  Caps (Gabapentin) .Marland Kitchen.. 1po Three Times A Day 13)  Study Drug 14)  Furosemide 80 Mg Tabs (Furosemide) .... Take One Tablet By Mouth Daily Extra If Needed 15)  Stool Softener 100 Mg Caps (Docusate Sodium) .... Use Asd 16)  Colchicine 0.6 Mg Tabs (Colchicine) .Marland Kitchen.. 1 By Mouth Once Daily  Current Medications (verified): 1)  Hydrocodone-Acetaminophen  10-325 Mg Tabs (Hydrocodone-Acetaminophen) .Marland Kitchen.. 1po Q 6 Hrs As Needed 2)  Enalapril Maleate 10 Mg Tabs (Enalapril Maleate) .... Take 1 Tablet By Mouth Once Daily 3)  Metformin Hcl 500 Mg Tb24 (Metformin Hcl) .... Take 4 Tablet By Mouth Once A Day 4)  Xanax 0.25 Mg Tabs (Alprazolam) .Marland Kitchen.. 1 By Mouth Two Times A Day As Needed 5)  Klor-Con M20 20 Meq  Tbcr (Potassium Chloride Crys Cr) .Marland Kitchen.. 1 By Mouth Qd 6)  Freestyle Test  Strp (Glucose Blood) .... Test Blood Glucose Three Times A Day 7)  Carvedilol 12.5 Mg Tabs (Carvedilol) .... Take One Tablet By Mouth Twice A Day 8)  Glimepiride 4 Mg  Tabs (Glimepiride) .Marland Kitchen.. 1 By Mouth Once Daily 9)  Aspirin 81 Mg Tbec (Aspirin) .... Take One Tablet By Mouth Daily 10)  Allopurinol 300 Mg Tabs (Allopurinol) .Marland Kitchen.. 1 By Mouth Once Daily 11)  Gabapentin 300 Mg  Caps (Gabapentin) .Marland Kitchen.. 1po Three Times A Day 12)  Study Drug 13)  Furosemide 80 Mg Tabs (Furosemide) .... Take One Tablet By Mouth Daily Extra If Needed 14)  Stool Softener 100 Mg Caps (Docusate Sodium) .... Use Asd 15)  Triamcinolone Acetonide 0.5 % Crea (Triamcinolone Acetonide) .... Use Asd Two Times A Day As Needed  Allergies (verified): 1)  ! * Crestor  Past History:  Past Medical History: Last updated: 08/30/2008 Diabetes mellitus, type II Obesity Depression Benign prostatic hypertrophy h/o prostate cancer Allergic rhinitis erectile dysfunction Gout  2. Coronary artery disease status post coronary bypass graft surgery     in 1992 and status post multiple percutaneous intervention with 40%     in-stent restenosis in the native right coronary and 70% in-stent     restenosis within the stent in the LAD supplied by the mammary     graft. 3. Good left ventricular function. 4. Diastolic heart failure 5. History of status post dual-mode, dual-pacing, dual-sensing     pacemaker implantation. 6. Hypertension  7. Hyperlipidemia.      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   Head:  normocephalic and atraumatic.   Eyes:  vision grossly intact, pupils equal, and pupils round.   Ears:  R ear normal and L ear normal.   Nose:  no external deformity and no nasal discharge.   Mouth:  no gingival abnormalities and  pharynx pink and moist.   Neck:  supple and no masses.   Lungs:  normal respiratory effort and normal breath sounds.   Heart:  normal rate and regular rhythm.   Extremities:  no edema, no erythema    Impression & Recommendations:  Problem # 1:  DIABETES MELLITUS, TYPE II (ICD-250.00)  His updated medication list for this problem includes:    Enalapril Maleate 10 Mg Tabs (Enalapril maleate) .Marland Kitchen... Take 1 tablet by mouth once daily    Metformin Hcl 500 Mg Tb24 (Metformin hcl) .Marland Kitchen... Take 4 tablet by mouth once a day    Glimepiride 4 Mg Tabs (Glimepiride) .Marland Kitchen... 1 by mouth once daily    Aspirin 81 Mg Tbec (Aspirin) .Marland Kitchen... Take one tablet by mouth daily  Labs Reviewed: Creat: 0.8 (03/09/2009)    Reviewed HgBA1c results: 6.8 (03/09/2009)  7.1 (10/22/2008) stable overall by hx and exam, ok to continue meds/tx as is   Problem # 2:  HYPERTENSION (ICD-401.9)  His updated medication list for this problem includes:    Enalapril Maleate 10 Mg Tabs (Enalapril maleate) .Marland Kitchen... Take 1 tablet by mouth once daily    Carvedilol 12.5 Mg Tabs (Carvedilol) .Marland Kitchen... Take one tablet by mouth twice a day    Furosemide 80 Mg Tabs (Furosemide) .Marland Kitchen... Take one tablet by mouth  daily extra if needed  BP today: 118/70 Prior BP: 126/72 (04/30/2009)  Labs Reviewed: K+: 4.5 (03/09/2009) Creat: : 0.8 (03/09/2009)   Chol: 104 (03/09/2009)   HDL: 44.10 (03/09/2009)   LDL: 46 (03/09/2009)   TG: 69.0 (03/09/2009) stable overall by hx and exam, ok to continue meds/tx as is   Problem # 3:  HYPERLIPIDEMIA (ICD-272.4)  Labs Reviewed: SGOT: 28 (03/09/2009)   SGPT: 32 (03/09/2009)   HDL:44.10 (03/09/2009), 36.30 (10/22/2008)  LDL:46 (03/09/2009), 54 (10/22/2008)  Chol:104 (03/09/2009), 104 (10/22/2008)  Trig:69.0 (03/09/2009), 71.0 (10/22/2008) stable overall by hx and exam, ok to continue meds/tx as is  - to cont diet  Complete Medication List: 1)  Hydrocodone-acetaminophen 10-325 Mg Tabs (Hydrocodone-acetaminophen) .Marland Kitchen.. 1po q 6 hrs as needed 2)  Enalapril Maleate 10 Mg Tabs (Enalapril maleate) .... Take 1 tablet by mouth once daily 3)  Metformin Hcl 500 Mg Tb24 (Metformin hcl) .... Take 4 tablet by mouth once a day 4)  Xanax 0.25 Mg Tabs (Alprazolam) .Marland Kitchen.. 1 by mouth two times a day as needed 5)  Klor-con M20 20 Meq Tbcr (Potassium chloride crys cr) .Marland Kitchen.. 1 by mouth qd 6)  Freestyle Test Strp (Glucose blood) .... Test blood glucose three times a day 7)  Carvedilol 12.5 Mg Tabs (Carvedilol) .... Take one tablet by mouth twice a day 8)  Glimepiride 4 Mg Tabs (Glimepiride) .Marland Kitchen.. 1 by mouth once daily 9)  Aspirin 81 Mg Tbec (Aspirin) .... Take one tablet by mouth daily 10)  Allopurinol 300 Mg Tabs (Allopurinol) .Marland Kitchen.. 1 by mouth once daily 11)  Gabapentin 300 Mg Caps (Gabapentin) .Marland Kitchen.. 1po three times a day 12)  Study Drug  13)  Furosemide 80 Mg Tabs (Furosemide) .... Take one tablet by mouth daily extra if needed 14)  Stool Softener 100 Mg Caps (Docusate sodium) .... Use asd 15)  Triamcinolone Acetonide 0.5 % Crea (Triamcinolone acetonide) .... Use asd two times a day as needed  Patient Instructions: 1)  Continue all previous medications as before this visit  2)  Please  schedule a follow-up appointment in 6 months. Prescriptions: TRIAMCINOLONE ACETONIDE 0.5 % CREA (TRIAMCINOLONE ACETONIDE) use asd two times a day as  needed  #1 x 1   Entered and Authorized by:   Corwin Levins MD   Signed by:   Corwin Levins MD on 05/09/2009   Method used:   Electronically to        [facility] (retail)       [address]       [place], [place]  [address]       Ph: [phone]       Fax: [phone]   RxID:   [id number] ENALAPRIL MALEATE 10 MG TABS (ENALAPRIL MALEATE) Take 1 tablet by mouth once daily  #90 x 3   Entered and Authorized by:   Corwin Levins MD   Signed by:   Corwin Levins MD on 05/06/2009   Method used:   Electronically to        [facility] (retail)       [address]       [place], [place]  [address]       Ph: [phone]       Fax: [phone]   RxID:   [id number]

## 2010-03-01 NOTE — Miscellaneous (Signed)
Summary: Orders Update  Clinical Lists Changes  Problems: Added new problem of PSA, INCREASED (ICD-790.93) Orders: Added new Referral order of Urology Referral (Urology) - Signed 

## 2010-03-01 NOTE — Assessment & Plan Note (Signed)
Summary: FLU SHOT/NWS  Nurse Visit   Allergies: 1)  ! * Crestor  Orders Added: 1)  Flu Vaccine 93yrs + MEDICARE PATIENTS [Q2039] 2)  Administration Flu vaccine - MCR [G0008] Flu Vaccine Consent Questions     Do you have a history of severe allergic reactions to this vaccine? no    Any prior history of allergic reactions to egg and/or gelatin? no    Do you have a sensitivity to the preservative Thimersol? no    Do you have a past history of Guillan-Barre Syndrome? no    Do you currently have an acute febrile illness? no    Have you ever had a severe reaction to latex? no    Vaccine information given and explained to patient? yes    Are you currently pregnant? no    Lot Number:[id number]   Exp Date:07/30/2010   Site Given  Left Deltoid IM.lbmedflu  Appended Document: FLU SHOT/NWS flu shot given in right deltoid

## 2010-03-01 NOTE — Progress Notes (Signed)
  Phone Note Outgoing Call   Call placed by: Robin Call placed to: Patient Summary of Call: Called pt. to informed received fax from [facility] for a Spinal Orthosis Brace. Patient informed he did discuss the need for one with Dr. Jonny Ruiz due to Back pain from arthritis. The patient also informed me that he is still having some weakness and dizziness after being outside in the heat. The pt. was encouraged to schedule an OV for these symptoms. Initial call taken by: Robin Ewing CMA (AAMA),  August 26, 2009 11:30 AM

## 2010-03-01 NOTE — Medication Information (Signed)
Summary: Diabetic Supplies/[facility]  Diabetic Supplies/[facility]   Imported By: Sherian Rein 08/13/2009 11:17:12  _____________________________________________________________________  External Attachment:    Type:   Image     Comment:   External Document  Appended Document: Diabetic Supplies/[facility] Paperwork re-faxed per Theodoro Grist @ [facility] request

## 2010-03-01 NOTE — Progress Notes (Signed)
Summary: prob w.legs  Phone Note Call from Patient   Caller: Patient Reason for Call: Talk to Nurse Summary of Call: pt calling to say his legs feel like rubber when he walks-last few months-wonders if it's due to his pacemaker-pls advise [phone] Initial call taken by: Glynda Jaeger,  September 29, 2009 9:16 AM  Additional Follow-up for Phone Call Additional follow up Details #1::        spoke w/pt--pt concerned that pacer battery is getting weak and causing legs to feel like rubber.  last check battery had 9 mths to eri.  Pt was told that battery was not getting weak.  9 mths to Charolette Child in scheduling scheduled ov with Dr Juanda Chance for 10-27-09 @ 1445 b/c pt thinks he may have blockage and wants to be seen by Dr Juanda Chance. Vella Kohler  September 29, 2009 5:04 PM

## 2010-03-01 NOTE — Medication Information (Signed)
Summary: Diabetic suppplies/[facility]  Diabetic suppplies/[facility]   Imported By: Lester Loco 07/30/2009 07:49:31  _____________________________________________________________________  External Attachment:    Type:   Image     Comment:   External Document

## 2010-03-01 NOTE — Letter (Signed)
Summary: Corrected/[facility]  Corrected/[facility]   Imported By: Lester [place] 08/31/2009 10:25:38  _____________________________________________________________________  External Attachment:    Type:   Image     Comment:   External Document

## 2010-03-01 NOTE — Letter (Signed)
Summary: CMN/[facility]  CMN/[facility]   Imported By: Lester [place] 08/30/2009 09:55:26  _____________________________________________________________________  External Attachment:    Type:   Image     Comment:   External Document

## 2010-03-01 NOTE — Miscellaneous (Signed)
Summary: dx correction  Clinical Lists Changes  Problems: Changed problem from PACEMAKER (ICD-V45..01) to PACEMAKER, PERMANENT (ICD-V45.01)  changed the incorrect dx code to correct dx code 

## 2010-03-01 NOTE — Letter (Signed)
Summary: Request for records for back brace/[facility]  Request for records for back brace/[facility]   Imported By: Sherian Rein 09/27/2009 12:16:42  _____________________________________________________________________  External Attachment:    Type:   Image     Comment:   External Document

## 2010-03-01 NOTE — Progress Notes (Signed)
  Request recieved from [facility] sent to [facility]. [facility] [place]  September 08, 2009 8:00 AM

## 2010-03-01 NOTE — Progress Notes (Signed)
  Walk in Patient Form Recieved " Pt left [employer] papers" sent to [facility]  December 03, 2009 1:22 PM

## 2010-03-01 NOTE — Progress Notes (Signed)
Summary: pt request call  Phone Note Call from Patient Call back at Home Phone [phone]   Caller: Patient Reason for Call: Talk to Nurse Initial call taken by: Judie Grieve,  December 07, 2009 8:37 AM  Follow-up for Phone Call        I spoke with the pt. Follow-up by: Sherri Rad, RN, BSN,  December 07, 2009 9:32 AM

## 2010-03-01 NOTE — Progress Notes (Signed)
Summary: medication refill  Phone Note Call from Patient Call back at Home Phone [phone]   Caller: Patient Call For: Randall Levins MD Summary of Call: Pt requests Hydrocodone refill but pharmacy states it was denied. Please advise Initial call taken by: Verdell Face,  December 27, 2009 9:37 AM  Follow-up for Phone Call        Rx is pending MD's approval.  Follow-up by: Lamar Sprinkles, CMA,  December 27, 2009 10:27 AM  Additional Follow-up for Phone Call Additional follow up Details #1::        Called pt md approve will leave up front for pick-up Additional Follow-up by: Orlan Leavens RMA,  December 27, 2009 12:05 PM

## 2010-03-01 NOTE — Letter (Signed)
Summary: [employer] - Disability Status Update  [employer] - Disability Status Update   Imported By: Marylou Mccoy 01/05/2010 10:37:46  _____________________________________________________________________  External Attachment:    Type:   Image     Comment:   External Document

## 2010-03-01 NOTE — Progress Notes (Signed)
Summary: personal called (LM for [facility] LM for the pt to call)  Phone Note Call from Patient Call back at Home Phone [phone]   Caller: Patient Reason for Call: Talk to Nurse Details for Reason: no information was given, personal ,very important.  Initial call taken by: Lorne Skeens,  December 02, 2009 11:05 AM  Follow-up for Phone Call        I called and spoke with the pt. He states he has a disability form that he needs completed, but he does not have the $25 to pay [facility]. I explained he could bring the form by today and I would look at it. He states it will be tomorrow afternoon before he can get it here. I made him aware that Dr. Juanda Chance will be out of the office all next week. He verbalizes understanding and will bring the form to me tomorrow.  Follow-up by: Sherri Rad, RN, BSN,  December 02, 2009 11:38 AM  Additional Follow-up for Phone Call Additional follow up Details #1::        PT WOULD LIKE TO KNOW IF HIS DISABILITY PAPERS ARE DONE   Additional Follow-up by: Roe Coombs,  December 03, 2009 3:41 PM    Additional Follow-up for Phone Call Additional follow up Details #2::    Pt request call about disaabilty papers Judie Grieve  December 06, 2009 10:28 AM  I atttempted to call [facility]. Fleet Contras is not there. Medical Records will have her call me tomorrow. I have left a message for the pt to call. Sherri Rad, RN, BSN  December 06, 2009 2:39 PM   I spoke with the pt. today. He is aware that his paperwork went to [facility]. I have explained I am trying to get it, but it will not be ready this week b/c Dr. Juanda Chance has to sign it and he is out all week. He verbalizes  understanding. Sherri Rad, RN, BSN  December 07, 2009 9:33 AM   Dennie Bible from [facility] called me. She has received Mr. Whang insurance forms. She states he did not sign a release of information. I explained he wanted this to go directly to me b/c he cannot afford the $25 fee from  [facility]. Dennie Bible will send the forms to me. Sherri Rad, RN, BSN  December 07, 2009 9:56 AM    Appended Document: personal called (LM for [facility] LM for the pt to call) I called the pt today. He is aware his disability papers are ready and at the front desk for pick up.

## 2010-03-01 NOTE — Progress Notes (Signed)
Summary: REFILL - Pain med  Phone Note Refill Request Call back at Home Phone [phone]   Refills Requested: Medication #1:  HYDROCODONE-ACETAMINOPHEN 10-325 MG TABS 1po q 6 hrs as needed Initial call taken by: Lamar Sprinkles, CMA,  August 04, 2009 12:43 PM    New/Updated Medications: HYDROCODONE-ACETAMINOPHEN 10-325 MG TABS (HYDROCODONE-ACETAMINOPHEN) 1po q 6 hrs as needed Prescriptions: HYDROCODONE-ACETAMINOPHEN 10-325 MG TABS (HYDROCODONE-ACETAMINOPHEN) 1po q 6 hrs as needed  #120 x 3   Entered and Authorized by:   Corwin Levins MD   Signed by:   Corwin Levins MD on 08/04/2009   Method used:   Print then Give to Patient   RxID:   [id number]  done hardcopy to LIM side B - dahlia Corwin Levins MD  August 04, 2009 12:54 PM  Rx faxed to pharmacy Margaret Pyle, CMA  August 04, 2009 2:03 PM

## 2010-03-01 NOTE — Letter (Signed)
Summary: Implantable Device Instructions  Architectural technologist, Main Office  [address]   [place], [place] [address]   Phone: [phone]  Fax: [phone]      Implantable Device Instructions  You are scheduled for:  _____ Permanent Transvenous Pacemaker _____ Implantable Cardioverter Defibrillator _____ Implantable Loop Recorder __x___ Generator Change  on Tuesday 11/02/09 with Dr. Juanda Chance.  1.  Please arrive at the Short Stay Center at [facility] at 7:00 am on the day of your procedure.  2.  Do not eat or drink the night before your procedure.  3.  Complete lab work on (today- 9/28).  The lab at [facility] is open from 8:30 AM to 1:30 PM and from 2:30 PM to 5:00 PM.  The lab at [facility] is open from 7:30 AM to 5:30 PM.  You do not have to be fasting.  4.  Do NOT take these medications the morning of your procedure: Metformin & Glimepiride.    5.  Plan for an overnight stay.  Bring your insurance cards and a list of your medications.  6.  Wash your chest and neck with antibacterial soap (any brand) the evening before and the morning of your procedure.  Rinse well.  7.  Education material received:     Pacemaker _____           ICD _____           Arrhythmia _____  *If you have ANY questions after you get home, please call the office [phone].  *Every attempt is made to prevent procedures from being rescheduled.  Due to the nauture of Electrophysiology, rescheduling can happen.  The physician is always aware and directs the staff when this occurs.

## 2010-03-01 NOTE — Medication Information (Signed)
Summary: [facility]  [facility]   Imported By: Lester [place] 07/19/2009 09:53:10  _____________________________________________________________________  External Attachment:    Type:   Image     Comment:   External Document

## 2010-03-01 NOTE — Progress Notes (Signed)
Summary: Referal change  Phone Note Call from Patient Call back at Home Phone [phone]   Caller: Patient Summary of Call: pt called stating that he does not want to go to Urology office because there is an employee there that is a member of his church and he very uncomfortable with this. please advise Initial call taken by: Margaret Pyle, CMA,  March 19, 2009 11:33 AM  Follow-up for Phone Call        [facility] the only group in [place];  is he willing to see a Museum/gallery curator  or [employer]? Follow-up by: Corwin Levins MD,  March 19, 2009 1:08 PM  Additional Follow-up for Phone Call Additional follow up Details #1::        Left message to call back to office with spouse to call back to office. Additional Follow-up by: Lucious Groves,  March 19, 2009 2:34 PM    Additional Follow-up for Phone Call Additional follow up Details #2::    Pt states [place] would be closest for him. He would like to be referred to Urologist in [place]. Follow-up by: Verdell Face,  March 22, 2009 8:10 AM  Additional Follow-up for Phone Call Additional follow up Details #3:: Details for Additional Follow-up Action Taken: [facility] [phone], Thanks. Margaret Pyle, CMA  March 22, 2009 4:30 PM   Appt Dr Artis Flock ([facility]) 03/26/09 @ 10am, PT AWARE . Additional Follow-up by: Dagoberto Reef,  March 23, 2009 2:33 PM

## 2010-03-01 NOTE — Letter (Signed)
Summary: Corrected order/[facility]  Corrected order/[facility]   Imported By: Lester [place] 08/30/2009 10:36:33  _____________________________________________________________________  External Attachment:    Type:   Image     Comment:   External Document

## 2010-03-01 NOTE — Letter (Signed)
Summary: Diabetes Testing Supplies/[facility]   Diabetes Testing Supplies/[facility]   Imported By: Sherian Rein 07/29/2009 12:25:42  _____________________________________________________________________  External Attachment:    Type:   Image     Comment:   External Document

## 2010-03-01 NOTE — Assessment & Plan Note (Signed)
Summary: 6 MO ROV / $50 /[place]   Vital Signs:  Patient profile:   65 year old [place] Height:      69 inches Weight:      232 pounds BMI:     34.38 O2 Sat:      97 % on Room air Temp:     97.6 degrees F oral Pulse rate:   60 / minute BP sitting:   110 / 60  (left arm) Cuff size:   large  Vitals Entered ByZella Ball Ewing (March 09, 2009 8:20 AM)  O2 Flow:  Room air  CC: 6 Mo ROV/RE   Primary Care Provider:  Corwin Levins MD  CC:  6 Mo ROV/RE.  History of Present Illness: here with some sense of increased DOE with excercise at the church at ths gym only, not usually later in the day after being upright for about 1 -2  hrs DOE resolve but no obvious orhtopnea or PND;  seems better after am water pill as well;  no CP , worsening LE edema, palp's, syncope.    Pt denies new neuro symptoms such as headache, facial or extremity weakness .  Pt denies polydipsia, polyuria, or low sugar symptoms such as shakiness improved with eating.  Overall good compliance with meds, trying to follow low chol, DM diet, wt stable, little excercise however  Here for wellness Diet: Heart Healthy or DM if diabetic Physical Activities: Sedentary but trying to excercise at the gym at church more lately Depression/mood screen: Negative Hearing: Intact bilateral Visual Acuity: Grossly normal ADL's: Capable  Fall Risk: None Home Safety: Good End-of-Life Planning: Advance directive - Full code/I agree   Problems Prior to Update: 1)  Psa, Increased  (ICD-790.93) 2)  Special Screening Malig Neoplasms Other Sites  (ICD-V76.49) 3)  Fatigue  (ICD-780.79) 4)  Acute Gouty Arthropathy  (ICD-274.01) 5)  Peripheral Edema  (ICD-782.3) 6)  Laceration, Finger  (ICD-883.0) 7)  Encounter For Long-term Use of Other Medications  (ICD-V58.69) 8)  Atrioventricular Block, Complete  (ICD-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.0) 9)  Dyspnea  (ICD-786.05) 10)  Diastolic Heart Failure, Chronic  (ICD-428.32) 11)  Leg Pain, Bilateral  (ICD-729.5) 12)  Knee Pain,  Bilateral  (ICD-719.46) 13)  Acute Gouty Arthropathy  (ICD-274.01) 14)  Pacemaker  (ICD-V45.Marland Kitchen01) 15)  Cad, Autologous Bypass Graft  (ICD-414.02) 16)  Insomnia-sleep Disorder-unspec  (ICD-780.52) 17)  Lumbar Radiculopathy, Right  (ICD-724.4) 18)  Special Screening Malignant Neoplasm of Prostate  (ICD-V76.44) 19)  Gout  (ICD-274.9) 20)  Fatigue  (ICD-780.79) 21)  Ankle Sprain  (ICD-845.00) 22)  Rash-nonvesicular  (ICD-782.1) 23)  Erectile Dysfunction  (ICD-302.72) 24)  Allergic Rhinitis  (ICD-477.9) 25)  Neoplasm, Malignant, Prostate  (ICD-185) 26)  Family History Diabetes 1st Degree Relative  (ICD-V18.0) 27)  Family History of Cad [place]lier 1st Degree Relative <50  (ICD-V17.3) 28)  Coronary Artery Disease  (ICD-414.00) 29)  Benign Prostatic Hypertrophy  (ICD-600.00) 30)  Depression  (ICD-311) 31)  Overweight  (ICD-278.02) 32)  Hypertension  (ICD-401.9) 33)  Hyperlipidemia  (ICD-272.4) 34)  Diabetes Mellitus, Type II  (ICD-250.00) 35)  Congestive Heart Failure  (ICD-428.0)  Medications Prior to Update: 1)  Hydrocodone-Acetaminophen 10-325 Mg Tabs (Hydrocodone-Acetaminophen) .Marland Kitchen.. 1po Q 6 Hrs As Needed 2)  Enalapril Maleate 10 Mg Tabs (Enalapril Maleate) .... Take 1 Tablet By Mouth Once Daily 3)  Metformin Hcl 500 Mg Tb24 (Metformin Hcl) .... Take 4 Tablet By Mouth Once A Day 4)  Plavix 75 Mg Tabs (Clopidogrel Bisulfate) .Marland Kitchen.. 1 Tab Once  Daily 5)  Xanax 0.25 Mg Tabs (Alprazolam) .Marland Kitchen.. 1 By Mouth Two Times A Day As Needed 6)  Klor-Con M20 20 Meq  Tbcr (Potassium Chloride Crys Cr) .Marland Kitchen.. 1 By Mouth Qd 7)  Triamcinolone Acetonide 0.5 %  Crea (Triamcinolone Acetonide) .... Use Asd Two Times A Day Prn 8)  Freestyle Test  Strp (Glucose Blood) .... Test Blood Glucose Three Times A Day 9)  Carvedilol 12.5 Mg Tabs (Carvedilol) .... Take One Tablet By Mouth Twice A Day 10)  Glimepiride 4 Mg  Tabs (Glimepiride) .Marland Kitchen.. 1 By Mouth Once Daily 11)  Ecotrin 325 Mg  Tbec (Aspirin) .Marland Kitchen.. 1 By Mouth Qd 12)  Imdur  60 Mg Xr24h-Tab (Isosorbide Mononitrate) .Marland Kitchen.. 1 Tab Once Daily 13)  Allopurinol 300 Mg Tabs (Allopurinol) .Marland Kitchen.. 1 By Mouth Once Daily 14)  Gabapentin 300 Mg  Caps (Gabapentin) .Marland Kitchen.. 1po Three Times A Day 15)  Study Drug 16)  Furosemide 80 Mg Tabs (Furosemide) .... Take One Tablet By Mouth Daily Extra If Needed 17)  Stool Softener 100 Mg Caps (Docusate Sodium) .... Use Asd 18)  Colchicine 0.6 Mg Tabs (Colchicine) .Marland Kitchen.. 1 By Mouth Once Daily  Current Medications (verified): 1)  Hydrocodone-Acetaminophen 10-325 Mg Tabs (Hydrocodone-Acetaminophen) .Marland Kitchen.. 1po Q 6 Hrs As Needed 2)  Enalapril Maleate 10 Mg Tabs (Enalapril Maleate) .... Take 1 Tablet By Mouth Once Daily 3)  Metformin Hcl 500 Mg Tb24 (Metformin Hcl) .... Take 4 Tablet By Mouth Once A Day 4)  Plavix 75 Mg Tabs (Clopidogrel Bisulfate) .Marland Kitchen.. 1 Tab Once Daily 5)  Xanax 0.25 Mg Tabs (Alprazolam) .Marland Kitchen.. 1 By Mouth Two Times A Day As Needed 6)  Klor-Con M20 20 Meq  Tbcr (Potassium Chloride Crys Cr) .Marland Kitchen.. 1 By Mouth Qd 7)  Triamcinolone Acetonide 0.5 %  Crea (Triamcinolone Acetonide) .... Use Asd Two Times A Day Prn 8)  Freestyle Test  Strp (Glucose Blood) .... Test Blood Glucose Three Times A Day 9)  Carvedilol 12.5 Mg Tabs (Carvedilol) .... Take One Tablet By Mouth Twice A Day 10)  Glimepiride 4 Mg  Tabs (Glimepiride) .Marland Kitchen.. 1 By Mouth Once Daily 11)  Ecotrin 325 Mg  Tbec (Aspirin) .Marland Kitchen.. 1 By Mouth Qd 12)  Imdur 60 Mg Xr24h-Tab (Isosorbide Mononitrate) .Marland Kitchen.. 1 Tab Once Daily 13)  Allopurinol 300 Mg Tabs (Allopurinol) .Marland Kitchen.. 1 By Mouth Once Daily 14)  Gabapentin 300 Mg  Caps (Gabapentin) .Marland Kitchen.. 1po Three Times A Day 15)  Study Drug 16)  Furosemide 80 Mg Tabs (Furosemide) .... Take One Tablet By Mouth Daily Extra If Needed 17)  Stool Softener 100 Mg Caps (Docusate Sodium) .... Use Asd 18)  Colchicine 0.6 Mg Tabs (Colchicine) .Marland Kitchen.. 1 By Mouth Once Daily  Allergies (verified): 1)  ! * Crestor  Past History:  Past Medical History: Last updated:  08/30/2008 Diabetes mellitus, type II Obesity Depression Benign prostatic hypertrophy h/o prostate cancer Allergic rhinitis erectile dysfunction Gout  2. Coronary artery disease status post coronary bypass graft surgery     in 1992 and status post multiple percutaneous intervention with 40%     in-stent restenosis in the native right coronary and 70% in-stent     restenosis within the stent in the LAD supplied by the mammary     graft. 3. Good left ventricular function. 4. Diastolic heart failure 5. History of status post dual-mode, dual-pacing, dual-sensing     pacemaker implantation. 6. Hypertension  7. Hyperlipidemia.      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         all otherwise negative per pt -  excdept for mild ongoing fatigue, but no OSA symptoms  Physical  Exam  General:  alert and overweight-appearing.   Head:  normocephalic and atraumatic.   Eyes:  vision grossly intact, pupils equal, and pupils round.   Ears:  R ear normal and L ear normal.   Nose:  no external deformity and no nasal discharge.   Mouth:  no gingival abnormalities and pharynx pink and moist.   Neck:  supple and no masses.   Lungs:  normal respiratory effort and normal breath sounds.   Heart:  normal rate and regular rhythm.   Abdomen:  soft, non-tender, and normal bowel sounds.   Msk:  no joint tenderness and no joint swelling.   Extremities:  no edema, no erythema  Neurologic:  cranial nerves II-XII intact and strength normal in all extremities.   Skin:  color normal and no rashes.     Impression & Recommendations:  Problem # 1:  Preventive Health Care (ICD-V70.0)  Overall doing well, age appropriate education and counseling updated and referral for appropriate preventive services done unless declined, immunizations up to date or declined, diet counseling done if overweight, urged to quit smoking if smokes , most recent labs reviewed and current ordered if appropriate, ecg reviewed or declined (interpretation per ECG scanned in the EMR if done); information regarding Medicare Prevention requirements given if appropriate   Orders: First annual wellness visit with prevention plan  (Z6109)  Problem # 2:  DIABETES MELLITUS, TYPE II (ICD-250.00)  His updated medication list for this problem includes:    Enalapril Maleate 10 Mg Tabs (Enalapril maleate) .Marland Kitchen... Take 1 tablet by mouth once daily    Metformin Hcl 500 Mg Tb24 (Metformin hcl) .Marland Kitchen... Take 4 tablet by mouth once a day    Glimepiride 4 Mg Tabs (Glimepiride) .Marland Kitchen... 1 by mouth once daily    Ecotrin 325 Mg Tbec (Aspirin) .Marland Kitchen... 1 by mouth qd  Labs Reviewed: Creat: 0.8 (01/04/2009)    Reviewed HgBA1c results: 7.1 (10/22/2008)  6.9 (07/09/2008)  Problem # 3:  HYPERLIPIDEMIA (ICD-272.4)  Labs Reviewed: SGOT: 25  (10/22/2008)   SGPT: 21 (10/22/2008)   HDL:36.30 (10/22/2008), 34.40 (09/04/2008)  LDL:54 (10/22/2008), 49 (09/04/2008)  Chol:104 (10/22/2008), 97 (09/04/2008)  Trig:71.0 (10/22/2008), 68.0 (09/04/2008)  Orders: TLB-Lipid Panel (80061-LIPID)  Problem # 4:  HYPERTENSION (ICD-401.9)  His updated medication list for this problem includes:    Enalapril Maleate 10 Mg Tabs (Enalapril maleate) .Marland Kitchen... Take 1 tablet by mouth once daily    Carvedilol 12.5 Mg Tabs (Carvedilol) .Marland KitchenMarland KitchenMarland KitchenMarland Kitchen  Take one tablet by mouth twice a day    Furosemide 80 Mg Tabs (Furosemide) .Marland Kitchen... Take one tablet by mouth daily extra if needed  BP today: 110/60 Prior BP: 106/72 (01/04/2009)  Labs Reviewed: K+: 3.8 (01/04/2009) Creat: : 0.8 (01/04/2009)   Chol: 104 (10/22/2008)   HDL: 36.30 (10/22/2008)   LDL: 54 (10/22/2008)   TG: 71.0 (10/22/2008)  Problem # 5:  FATIGUE (ICD-780.79) exam benign, to check labs below; follow with expectant management  Orders: TLB-BMP (Basic Metabolic Panel-BMET) (80048-METABOL) TLB-CBC Platelet - w/Differential (85025-CBCD) TLB-Hepatic/Liver Function Pnl (80076-HEPATIC) TLB-TSH (Thyroid Stimulating Hormone) (84443-TSH) TLB-Sedimentation Rate (ESR) (85652-ESR) TLB-IBC Pnl (Iron/FE;Transferrin) (83550-IBC) TLB-B12 + Folate Pnl (16109_60454-U98/JXB) TLB-Udip ONLY (81003-UDIP)  Complete Medication List: 1)  Hydrocodone-acetaminophen 10-325 Mg Tabs (Hydrocodone-acetaminophen) .Marland Kitchen.. 1po q 6 hrs as needed 2)  Enalapril Maleate 10 Mg Tabs (Enalapril maleate) .... Take 1 tablet by mouth once daily 3)  Metformin Hcl 500 Mg Tb24 (Metformin hcl) .... Take 4 tablet by mouth once a day 4)  Plavix 75 Mg Tabs (Clopidogrel bisulfate) .Marland Kitchen.. 1 tab once daily 5)  Xanax 0.25 Mg Tabs (Alprazolam) .Marland Kitchen.. 1 by mouth two times a day as needed 6)  Klor-con M20 20 Meq Tbcr (Potassium chloride crys cr) .Marland Kitchen.. 1 by mouth qd 7)  Triamcinolone Acetonide 0.5 % Crea (Triamcinolone acetonide) .... Use asd two times a day prn 8)   Freestyle Test Strp (Glucose blood) .... Test blood glucose three times a day 9)  Carvedilol 12.5 Mg Tabs (Carvedilol) .... Take one tablet by mouth twice a day 10)  Glimepiride 4 Mg Tabs (Glimepiride) .Marland Kitchen.. 1 by mouth once daily 11)  Ecotrin 325 Mg Tbec (Aspirin) .Marland Kitchen.. 1 by mouth qd 12)  Imdur 60 Mg Xr24h-tab (Isosorbide mononitrate) .Marland Kitchen.. 1 tab once daily 13)  Allopurinol 300 Mg Tabs (Allopurinol) .Marland Kitchen.. 1 by mouth once daily 14)  Gabapentin 300 Mg Caps (Gabapentin) .Marland Kitchen.. 1po three times a day 15)  Study Drug  16)  Furosemide 80 Mg Tabs (Furosemide) .... Take one tablet by mouth daily extra if needed 17)  Stool Softener 100 Mg Caps (Docusate sodium) .... Use asd 18)  Colchicine 0.6 Mg Tabs (Colchicine) .Marland Kitchen.. 1 by mouth once daily  Other Orders: TD Toxoids IM 7 YR + (14782) Admin 1st Vaccine (95621) TLB-PSA (Prostate Specific Antigen) (84153-PSA)  Patient Instructions: 1)  you are given the refill on the pain med today 2)  you had the tetanus shot today 3)  Please go to the Lab in the basement for your blood and/or urine tests today 4)  Please schedule a follow-up appointment in 6 months. Prescriptions: HYDROCODONE-ACETAMINOPHEN 10-325 MG TABS (HYDROCODONE-ACETAMINOPHEN) 1po q 6 hrs as needed  #120 x 3   Entered and Authorized by:   Corwin Levins MD   Signed by:   Corwin Levins MD on 03/09/2009   Method used:   Print then Give to Patient   RxID:   [id number]    Immunizations Administered:  Tetanus Vaccine:    Vaccine Type: Td    Site: right deltoid    Mfr: [employer]    Dose: 0.5 ml    Route: IM    Given by: Zella Ball Ewing    Exp. Date: 12/15/2010    Lot #: [id number]    VIS given: 12/18/06 version given March 09, 2009.

## 2010-03-01 NOTE — Miscellaneous (Signed)
Summary: Device change out  Clinical Lists Changes  Observations: Added new observation of PPM SERL#: [id number] (11/09/2009 9:54) Added new observation of PPM MODL#: ADDRL1 (11/09/2009 9:54) Added new observation of PPMEXPLCOMM: 11/02/09 [employer]onic [id number] H explanted (11/09/2009 9:54) Added new observation of MAGNET RTE: BOL 85 ERI 65 (11/09/2009 9:54)      PPM Specifications Following MD:  Everardo Beals. Juanda Chance, MD     PPM Vendor:  [employer]     PPM Model Number:  [id number]     PPM Serial Number:  [id number] PPM DOI:  08/28/2001      Lead 1    Location: [facility]     DOI: 08/28/2001     Model #: [id number]     Serial #: [id number]     Status: active Lead 2    Location: RV     DOI: 08/28/2001     Model #: [id number]     Serial #: [id number]     Status: active  Magnet Response Rate:  BOL 85 ERI 65  Indications:  2 Av Block  Explantation Comments:  11/02/09 [employer] [id number] H explanted  PPM Follow Up Pacer Dependent:  Yes      Episodes Coumadin:  No  Parameters Mode:  DDD     Lower Rate Limit:  60     Upper Rate Limit:  130 Paced AV Delay:  150     Sensed AV Delay:  120

## 2010-03-01 NOTE — Assessment & Plan Note (Signed)
Summary: 3wk f/u    Visit Type:  Follow-up Pacer change out Primary Batya Citron:  Corwin Levins MD  CC:  neck pain and arm pain.  History of Present Illness: Mr. is 8 years and returned for management of CAD and his pacemaker and CHF. He had bypass surgery in 1992 and his last catheterization was in 2002 at which time he had occlusion of the saphenous vein graft to the circumflex artery. He has chronic diastolic CHF and has a pacemaker that was implanted for AV block which is a Medtronic pacemaker. he had a recent generator change 10/27/09 and thats doing well.   He came in today because of symptoms of left-sided headache intermittent right-sided weakness. this has been ongoing for several weeks now, carotid dopplets did not reveal any lesion.   His other problems include hypertension, hyperlipidemia, and diabetes.  Current Medications (verified): 1)  Hydrocodone-Acetaminophen 10-325 Mg Tabs (Hydrocodone-Acetaminophen) .Marland Kitchen.. 1po Q 6 Hrs As Needed 2)  Enalapril Maleate 10 Mg Tabs (Enalapril Maleate) .... Take 1 Tablet By Mouth Once Daily 3)  Metformin Hcl 500 Mg Tb24 (Metformin Hcl) .... Take 4 Tablet By Mouth Once A Day 4)  Xanax 0.25 Mg Tabs (Alprazolam) .Marland Kitchen.. 1 By Mouth Two Times A Day As Needed 5)  Klor-Con M20 20 Meq  Tbcr (Potassium Chloride Crys Cr) .Marland Kitchen.. 1 By Mouth Qd 6)  Freestyle Test  Strp (Glucose Blood) .... Test Blood Glucose Three Times A Day 7)  Carvedilol 12.5 Mg Tabs (Carvedilol) .... Take One Tablet By Mouth Twice A Day 8)  Glimepiride 4 Mg  Tabs (Glimepiride) .Marland Kitchen.. 1 By Mouth Once Daily 9)  Aspirin 81 Mg Tbec (Aspirin) .... Take One Tablet By Mouth Daily 10)  Allopurinol 300 Mg Tabs (Allopurinol) .Marland Kitchen.. 1 By Mouth Once Daily 11)  Gabapentin 300 Mg  Caps (Gabapentin) .Marland Kitchen.. 1po Three Times A Day 12)  Study Drug 13)  Furosemide 40 Mg Tabs (Furosemide) .... Take One Tablet By Mouth Daily (Take An Extra Tab If Pt Needs It For Fluid) 14)  Triamcinolone Acetonide 0.5 % Crea  (Triamcinolone Acetonide) .... Use Asd Two Times A Day As Needed  Allergies (verified): 1)  ! * Crestor  Past History:  Past Medical History: Last updated: 08/30/2008 Diabetes mellitus, type II Obesity Depression Benign prostatic hypertrophy h/o prostate cancer Allergic rhinitis erectile dysfunction Gout  2. Coronary artery disease status post coronary bypass graft surgery     in 1992 and status post multiple percutaneous intervention with 40%     in-stent restenosis in the native right coronary and 70% in-stent     restenosis within the stent in the LAD supplied by the mammary     graft. 3. Good left ventricular function. 4. Diastolic heart failure 5. History of status post dual-mode, dual-pacing, dual-sensing     pacemaker implantation. 6. Hypertension  7. Hyperlipidemia.      Review of Systems       negative except per HPI  Vital Signs:  Patient profile:   65 year old male Height:      71 inches Weight:      236 pounds Pulse rate:   60 / minute BP sitting:   140 / 87  (left arm) Cuff size:   large  Vitals Entered By: Burnett Kanaris, CNA (November 16, 2009 9:35 AM)  Physical Exam  General:  alert and overweight-appearing.   Head:  normocephalic and atraumatic.   Neck:  supple and no masses.  tenderness on cervical spine region  Lungs:  normal respiratory effort and normal breath sounds.   Heart:  normal rate and regular rhythm.   Extremities:  no edema, no erythema  Neurologic:  cranial nerves II-XII intact, strength normal in all extremities, and gait normal.     PPM Specifications Following MD:  Everardo Beals. Juanda Chance, MD     PPM Vendor:  Medtronic     PPM Model Number:  [id number]     PPM Serial Number:  [id number] [facility] PPM DOI:  11/02/2009     PPM Implanting MD:  Everardo Beals. Juanda Chance, MD  Lead 1    Location: RA     DOI: 08/28/2001     Model #: [id number]     Serial #: [id number]     Status: active Lead 2    Location: RV     DOI: 08/28/2001     Model #: [id number]     Serial #:  [id number]     Status: active  Magnet Response Rate:  BOL 85 ERI 65  Indications:  2 Av Block  Explantation Comments:  11/02/09 Medtronic [id number] H explanted  PPM Follow Up Battery Voltage:  2.79 V     Battery Est. Longevity:  11 YRS     Pacer Dependent:  Yes       PPM Device Measurements Atrium  Amplitude: 5.60 mV, Impedance: 497 ohms, Threshold: 0.50 V at 0.40 msec Right Ventricle  Amplitude: 8.00 mV, Impedance: 497 ohms, Threshold: 0.750 V at 0.40 msec  Episodes MS Episodes:  1     Percent Mode Switch:  <0.1%     Coumadin:  No Ventricular High Rate:  0     Atrial Pacing:  41.8%     Ventricular Pacing:  99.9%  Parameters Mode:  DDD     Lower Rate Limit:  60     Upper Rate Limit:  130 Paced AV Delay:  180     Sensed AV Delay:  150 Next Cardiology Appt Due:  01/31/2010 Tech Comments:  NORMAL DEVICE FUNCTION.  1 MODE SWITCH LASTING LESS THAN 1 MINUTE.  NO CHANGES MADE. ROV IN JAN W/BB. Vella Kohler  November 16, 2009 10:17 AM  Impression & Recommendations:  Problem # 1:  PACEMAKER (ICD-V45.Marland Kitchen01) Assessment Comment Only He has a DDD pacemaker that was implanted for AV block. This is a Medtronic pacemaker. He knows that he had has reverted to pacing. He is independent. Had generator change recently 10/27/09, doing well, denies new complaints. interogation of the device was WNL. will have him followup with dr. Jens Som. His updated medication list for this problem includes:    Enalapril Maleate 10 Mg Tabs (Enalapril maleate) .Marland Kitchen... Take 1 tablet by mouth two times a day    Carvedilol 12.5 Mg Tabs (Carvedilol) .Marland Kitchen... Take one tablet by mouth twice a day    Aspirin 81 Mg Tbec (Aspirin) .Marland Kitchen... Take one tablet by mouth daily  Problem # 2:  CAD, AUTOLOGOUS BYPASS GRAFT (ICD-414.02) Assessment: Comment Only He has had previous bypass surgery in 1992 and has one occluded graft to the circumflex artery. He's had no chest pain and is probably stable.  His updated medication list for  this problem includes:    Enalapril Maleate 10 Mg Tabs (Enalapril maleate) .Marland Kitchen... Take 1 tablet by mouth two times a day    Carvedilol 12.5 Mg Tabs (Carvedilol) .Marland Kitchen... Take one tablet by mouth twice a day    Aspirin 81 Mg Tbec (Aspirin) .Marland Kitchen... Take one tablet by mouth daily  Problem # 3:  HYPERTENSION (ICD-401.9) Assessment: Deteriorated BP slightly elevated, will increase enalapril dose to 20mg  daily  His updated medication list for this problem includes:    Enalapril Maleate 10 Mg Tabs (Enalapril maleate) .Marland Kitchen... Take 1 tablet by mouth two times a day    Carvedilol 12.5 Mg Tabs (Carvedilol) .Marland Kitchen... Take one tablet by mouth twice a day    Aspirin 81 Mg Tbec (Aspirin) .Marland Kitchen... Take one tablet by mouth daily    Furosemide 40 Mg Tabs (Furosemide) .Marland Kitchen... Take one tablet by mouth daily (take an extra tab if pt needs it for fluid)  BP today: 140/87 Prior BP: 127/81 (10/27/2009)  Labs Reviewed: K+: 4.5 (10/27/2009) Creat: : 0.8 (10/27/2009)   Chol: 104 (03/09/2009)   HDL: 44.10 (03/09/2009)   LDL: 46 (03/09/2009)   TG: 69.0 (03/09/2009)  Problem # 4:  HYPERLIPIDEMIA (ICD-272.4) Assessment: Comment Only  well controlled with diet alone, was on simvastatin however this was stopped due to adverse reaction (minor), will try him on pravastatin for coronary protection.   CHOL: 104 (03/09/2009)   LDL: 46 (03/09/2009)   HDL: 44.10 (03/09/2009)   TG: 69.0 (03/09/2009)  His updated medication list for this problem includes:    Pravastatin Sodium 20 Mg Tabs (Pravastatin sodium) .Marland Kitchen... Take one tablet by mouth daily at bedtime  Problem # 5:  DIABETES MELLITUS, TYPE II (ICD-250.00) Assessment: Comment Only managed by PCP  His updated medication list for this problem includes:    Enalapril Maleate 10 Mg Tabs (Enalapril maleate) .Marland Kitchen... Take 1 tablet by mouth two times a day    Metformin Hcl 500 Mg Tb24 (Metformin hcl) .Marland Kitchen... Take 4 tablet by mouth once a day    Glimepiride 4 Mg Tabs (Glimepiride) .Marland Kitchen... 1 by  mouth once daily    Aspirin 81 Mg Tbec (Aspirin) .Marland Kitchen... Take one tablet by mouth daily  Labs Reviewed: Creat: 0.8 (10/27/2009)    Reviewed HgBA1c results: 6.9 (08/13/2009)  6.8 (03/09/2009)  Patient Instructions: 1)  Increase enalapril to 10mg  two times a day. 2)  Start Pravastatin 20mg  once daily. 3)  You will need FASTING labwork in 6 weeks: lipid/liver (414.01;272.2). 4)  Your physician recommends that you schedule a follow-up appointment in: February with Dr. Jens Som. Prescriptions: PRAVASTATIN SODIUM 20 MG TABS (PRAVASTATIN SODIUM) Take one tablet by mouth daily at bedtime  #30 x 6   Entered by:   Sherri Rad, RN, BSN   Authorized by:   Lenoria Farrier, MD, [facility]   Signed by:   Sherri Rad, RN, BSN on 11/16/2009   Method used:   Electronically to        [facility] (retail)       [address]       [place], [place]  [address]       Ph: [phone]       Fax: [phone]   RxID:   [id number] ENALAPRIL MALEATE 10 MG TABS (ENALAPRIL MALEATE) Take 1 tablet by mouth two times a day  #60 x 6   Entered by:   Sherri Rad, RN, BSN   Authorized by:   Lenoria Farrier, MD, [facility]   Signed by:   Sherri Rad, RN, BSN on 11/16/2009   Method used:   Electronically to        [facility] (retail)       [address]       [place]  [place], [place]  [address]       Ph: [phone]       Fax: [phone]   RxID:   [id number]

## 2010-03-01 NOTE — Progress Notes (Signed)
Summary: has questions  Phone Note Call from Patient   Caller: Patient [phone] Reason for Call: Talk to Nurse Summary of Call: pt has questions-pls call  Follow-up for Phone Call        adv pt that p/w sent to [facility] and awaiting it to come back for signature. pt request copy and call after it is signed.  Follow-up by: Claris Gladden RN,  December 13, 2009 12:45 PM

## 2010-03-01 NOTE — Assessment & Plan Note (Signed)
Summary: 6 MO ROV /NWS  #   Vital Signs:  Patient profile:   65 year old male Height:      71 inches Weight:      235.13 pounds BMI:     32.91 O2 Sat:      94 % on Room air Temp:     97.4 degrees F oral Pulse rate:   65 / minute BP sitting:   120 / 62  (left arm) Cuff size:   large  Vitals Entered By: Zella Ball Ewing CMA (AAMA) (September 07, 2009 8:05 AM)  O2 Flow:  Room air CC: 6 month ROV/RE   Primary Care Tavyn Kurka:  Corwin Levins MD  CC:  6 month ROV/RE.  History of Present Illness: overall doing well;  no further dizziness , weakness, although reducing lasix and coreg has seemd to allow some increase slight in LE edema r> L (with s/p right saph vein harvesting as well). Overall he is pleased;  still has some mild sob,doe but Pt denies CP,  wheezing, orthopnea, pnd, worsening LE edema, palps, dizziness or syncope , and sleeps with his chronic 2 pillow.  No muscle cramps or new issues.  Pain control overall doing well with new lumbar brace, in  addition to hi chronic pain meds.  No change in bowel or bladder, no LE worsening pain, weakness, numb. No fever, wt loss, night sweats, loss of appetite or other constitutional symptoms  Currently has variable Air Condit at home and still sweats quite a bit during the day. trying to follow lower salt diet. Wt overall down 4 lbs since july visit. Walks over 100 yards slowly ok. Denies polyuria, polydipsia  Problems Prior to Update: 1)  Dizziness  (ICD-780.4) 2)  Dyspnea On Exertion  (ICD-786.09) 3)  Fatigue, Acute  (ICD-780.79) 4)  Preventive Health Care  (ICD-V70.0) 5)  Psa, Increased  (ICD-790.93) 6)  Special Screening Malig Neoplasms Other Sites  (ICD-V76.49) 7)  Fatigue  (ICD-780.79) 8)  Acute Gouty Arthropathy  (ICD-274.01) 9)  Peripheral Edema  (ICD-782.3) 10)  Laceration, Finger  (ICD-883.0) 11)  Encounter For Long-term Use of Other Medications  (ICD-V58.69) 12)  Atrioventricular Block, Complete  (ICD-426.0) 13)  Dyspnea   (ICD-786.05) 14)  Diastolic Heart Failure, Chronic  (ICD-428.32) 15)  Leg Pain, Bilateral  (ICD-729.5) 16)  Knee Pain, Bilateral  (ICD-719.46) 17)  Acute Gouty Arthropathy  (ICD-274.01) 18)  Pacemaker  (ICD-V45.Marland Kitchen01) 19)  Cad, Autologous Bypass Graft  (ICD-414.02) 20)  Insomnia-sleep Disorder-unspec  (ICD-780.52) 21)  Lumbar Radiculopathy, Right  (ICD-724.4) 22)  Special Screening Malignant Neoplasm of Prostate  (ICD-V76.44) 23)  Gout  (ICD-274.9) 24)  Fatigue  (ICD-780.79) 25)  Ankle Sprain  (ICD-845.00) 26)  Rash-nonvesicular  (ICD-782.1) 27)  Erectile Dysfunction  (ICD-302.72) 28)  Allergic Rhinitis  (ICD-477.9) 29)  Neoplasm, Malignant, Prostate  (ICD-185) 30)  Family History Diabetes 1st Degree Relative  (ICD-V18.0) 31)  Family History of Cad Male 1st Degree Relative <50  (ICD-V17.3) 32)  Coronary Artery Disease  (ICD-414.00) 33)  Benign Prostatic Hypertrophy  (ICD-600.00) 34)  Depression  (ICD-311) 35)  Overweight  (ICD-278.02) 36)  Hypertension  (ICD-401.9) 37)  Hyperlipidemia  (ICD-272.4) 38)  Diabetes Mellitus, Type II  (ICD-250.00) 39)  Congestive Heart Failure  (ICD-428.0)  Medications Prior to Update: 1)  Hydrocodone-Acetaminophen 10-325 Mg Tabs (Hydrocodone-Acetaminophen) .Marland Kitchen.. 1po Q 6 Hrs As Needed 2)  Enalapril Maleate 10 Mg Tabs (Enalapril Maleate) .... Take 1 Tablet By Mouth Once Daily 3)  Metformin Hcl 500  Mg Tb24 (Metformin Hcl) .... Take 4 Tablet By Mouth Once A Day 4)  Xanax 0.25 Mg Tabs (Alprazolam) .Marland Kitchen.. 1 By Mouth Two Times A Day As Needed 5)  Klor-Con M20 20 Meq  Tbcr (Potassium Chloride Crys Cr) .Marland Kitchen.. 1 By Mouth Qd 6)  Freestyle Test  Strp (Glucose Blood) .... Test Blood Glucose Three Times A Day 7)  Carvedilol 12.5 Mg Tabs (Carvedilol) .... Take One Tablet By Mouth Twice A Day 8)  Glimepiride 4 Mg  Tabs (Glimepiride) .Marland Kitchen.. 1 By Mouth Once Daily 9)  Aspirin 81 Mg Tbec (Aspirin) .... Take One Tablet By Mouth Daily 10)  Allopurinol 300 Mg Tabs (Allopurinol)  .Marland Kitchen.. 1 By Mouth Once Daily 11)  Gabapentin 300 Mg  Caps (Gabapentin) .Marland Kitchen.. 1po Three Times A Day 12)  Study Drug 13)  Furosemide 80 Mg Tabs (Furosemide) .... Take One Tablet By Mouth Daily Extra If Needed 14)  Stool Softener 100 Mg Caps (Docusate Sodium) .... Use Asd 15)  Triamcinolone Acetonide 0.5 % Crea (Triamcinolone Acetonide) .... Use Asd Two Times A Day As Needed 16)  Plavix 75 Mg Tabs (Clopidogrel Bisulfate) .... One By Mouth Daily  Current Medications (verified): 1)  Hydrocodone-Acetaminophen 10-325 Mg Tabs (Hydrocodone-Acetaminophen) .Marland Kitchen.. 1po Q 6 Hrs As Needed 2)  Enalapril Maleate 10 Mg Tabs (Enalapril Maleate) .... Take 1 Tablet By Mouth Once Daily 3)  Metformin Hcl 500 Mg Tb24 (Metformin Hcl) .... Take 4 Tablet By Mouth Once A Day 4)  Xanax 0.25 Mg Tabs (Alprazolam) .Marland Kitchen.. 1 By Mouth Two Times A Day As Needed 5)  Klor-Con M20 20 Meq  Tbcr (Potassium Chloride Crys Cr) .Marland Kitchen.. 1 By Mouth Qd 6)  Freestyle Test  Strp (Glucose Blood) .... Test Blood Glucose Three Times A Day 7)  Carvedilol 12.5 Mg Tabs (Carvedilol) .... Take One Tablet By Mouth Twice A Day 8)  Glimepiride 4 Mg  Tabs (Glimepiride) .Marland Kitchen.. 1 By Mouth Once Daily 9)  Aspirin 81 Mg Tbec (Aspirin) .... Take One Tablet By Mouth Daily 10)  Allopurinol 300 Mg Tabs (Allopurinol) .Marland Kitchen.. 1 By Mouth Once Daily 11)  Gabapentin 300 Mg  Caps (Gabapentin) .Marland Kitchen.. 1po Three Times A Day 12)  Study Drug 13)  Furosemide 80 Mg Tabs (Furosemide) .... Take One Tablet By Mouth Daily Extra If Needed 14)  Stool Softener 100 Mg Caps (Docusate Sodium) .... Use Asd 15)  Triamcinolone Acetonide 0.5 % Crea (Triamcinolone Acetonide) .... Use Asd Two Times A Day As Needed 16)  Plavix 75 Mg Tabs (Clopidogrel Bisulfate) .... One By Mouth Daily  Allergies (verified): 1)  ! * Crestor  Past History:  Past Medical History: Last updated: 08/30/2008 Diabetes mellitus, type II Obesity Depression Benign prostatic hypertrophy h/o prostate cancer Allergic  rhinitis erectile dysfunction Gout  2. Coronary artery disease status post coronary bypass graft surgery     in 1992 and status post multiple percutaneous intervention with 40%     in-stent restenosis in the native right coronary and 70% in-stent     restenosis within the stent in the LAD supplied by the mammary     graft. 3. Good left ventricular function. 4. Diastolic heart failure 5. History of status post dual-mode, dual-pacing, dual-sensing     pacemaker implantation. 6. Hypertension  7. Hyperlipidemia.      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   Head:  normocephalic and atraumatic.   Eyes:  vision grossly intact, pupils equal, and pupils round.   Ears:  R ear normal and L ear normal.   Nose:  no external deformity and no nasal discharge.   Mouth:  no gingival abnormalities and pharynx pink and moist.   Neck:  supple and no masses.   Lungs:  normal respiratory effort and normal breath sounds.   Heart:  normal rate and regular rhythm.   Abdomen:  soft, non-tender, and normal bowel sounds.   Msk:  no joint tenderness and no joint swelling.   Extremities:  no edema, no erythema  Neurologic:  cranial nerves II-XII intact, strength normal in all extremities, and gait normal.     Impression & Recommendations:  Problem # 1:  DIZZINESS (ICD-780.4) resolved with previous management, cont to follow for any worsening symptoms  Problem # 2:  HYPERTENSION (ICD-401.9)  His  updated medication list for this problem includes:    Enalapril Maleate 10 Mg Tabs (Enalapril maleate) .Marland Kitchen... Take 1 tablet by mouth once daily    Carvedilol 12.5 Mg Tabs (Carvedilol) .Marland Kitchen... Take one tablet by mouth twice a day    Furosemide 80 Mg Tabs (Furosemide) .Marland Kitchen... Take one tablet by mouth daily extra if needed  BP today: 120/62 Prior BP: 112/64 (08/17/2009)  Labs Reviewed: K+: 4.6 (08/13/2009) Creat: : 0.9 (08/13/2009)   Chol: 104 (03/09/2009)   HDL: 44.10 (03/09/2009)   LDL: 46 (03/09/2009)   TG: 69.0 (03/09/2009) stable overall by hx and exam, ok to continue meds/tx as is   Problem # 3:  DIABETES MELLITUS, TYPE II (ICD-250.00)  His updated medication list for this problem includes:    Enalapril Maleate 10 Mg Tabs (Enalapril maleate) .Marland Kitchen... Take 1 tablet by mouth once daily    Metformin Hcl 500 Mg Tb24 (Metformin hcl) .Marland Kitchen... Take 4 tablet by mouth once a day    Glimepiride 4 Mg Tabs (Glimepiride) .Marland Kitchen... 1 by mouth once daily    Aspirin 81 Mg Tbec (Aspirin) .Marland Kitchen... Take one tablet by mouth daily  Labs Reviewed: Creat: 0.9 (08/13/2009)    Reviewed HgBA1c results: 6.9 (08/13/2009)  6.8 (03/09/2009) stable overall by hx and exam, ok to continue meds/tx as is   Problem # 4:  DIASTOLIC HEART FAILURE, CHRONIC (ICD-428.32)  His updated medication list for this problem includes:    Enalapril Maleate 10 Mg Tabs (Enalapril maleate) .Marland Kitchen... Take 1 tablet by mouth once daily    Carvedilol 12.5 Mg Tabs (Carvedilol) .Marland Kitchen... Take one tablet by mouth twice a day    Aspirin 81 Mg Tbec (Aspirin) .Marland Kitchen... Take one tablet by mouth daily    Furosemide 80 Mg Tabs (Furosemide) .Marland Kitchen... Take one tablet by mouth daily extra if needed    Plavix 75 Mg Tabs (Clopidogrel bisulfate) ..... One by mouth daily stable overall by hx and exam, ok to continue meds/tx as is   Complete Medication List: 1)  Hydrocodone-acetaminophen 10-325 Mg Tabs (Hydrocodone-acetaminophen) .Marland Kitchen.. 1po q 6 hrs as needed 2)  Enalapril Maleate 10  Mg Tabs (Enalapril maleate) .... Take 1 tablet by mouth once daily 3)  Metformin Hcl 500 Mg Tb24 (Metformin hcl) .Marland KitchenMarland KitchenMarland Kitchen  Take 4 tablet by mouth once a day 4)  Xanax 0.25 Mg Tabs (Alprazolam) .Marland Kitchen.. 1 by mouth two times a day as needed 5)  Klor-con M20 20 Meq Tbcr (Potassium chloride crys cr) .Marland Kitchen.. 1 by mouth qd 6)  Freestyle Test Strp (Glucose blood) .... Test blood glucose three times a day 7)  Carvedilol 12.5 Mg Tabs (Carvedilol) .... Take one tablet by mouth twice a day 8)  Glimepiride 4 Mg Tabs (Glimepiride) .Marland Kitchen.. 1 by mouth once daily 9)  Aspirin 81 Mg Tbec (Aspirin) .... Take one tablet by mouth daily 10)  Allopurinol 300 Mg Tabs (Allopurinol) .Marland Kitchen.. 1 by mouth once daily 11)  Gabapentin 300 Mg Caps (Gabapentin) .Marland Kitchen.. 1po three times a day 12)  Study Drug  13)  Furosemide 80 Mg Tabs (Furosemide) .... Take one tablet by mouth daily extra if needed 14)  Stool Softener 100 Mg Caps (Docusate sodium) .... Use asd 15)  Triamcinolone Acetonide 0.5 % Crea (Triamcinolone acetonide) .... Use asd two times a day as needed 16)  Plavix 75 Mg Tabs (Clopidogrel bisulfate) .... One by mouth daily  Patient Instructions: 1)  Continue all previous medications as before this visit 2)  Please schedule a follow-up appointment in Feb 2012 with "yearly medicare exam"

## 2010-03-01 NOTE — Medication Information (Signed)
Summary: Guidelines Medicare/[employer]  Guidelines Medicare/[employer]   Imported By: Lester Buckhead 08/12/2009 08:16:45  _____________________________________________________________________  External Attachment:    Type:   Image     Comment:   External Document

## 2010-03-01 NOTE — Cardiovascular Report (Signed)
Summary: Office Visit   Office Visit   Imported By: Roderic Ovens 08/16/2009 12:39:29  _____________________________________________________________________  External Attachment:    Type:   Image     Comment:   External Document

## 2010-03-01 NOTE — Assessment & Plan Note (Signed)
Summary: rov   Visit Type:  Follow-up Primary Bradlee Heitman:  Corwin Levins MD  CC:  leg weakness and when side of head hurts pt right arm/ hand goes numb.  History of Present Illness: Mr. is 65 years and returned for management of CAD and his pacemaker and CHF. He had bypass surgery in 1992 and his last catheterization was in 2002 at which time he had occlusion of the saphenous vein graft to the circumflex artery. He has chronic diastolic CHF and has a pacemaker that was implanted for AV block which is a Medtronic pacemaker.  He came in today because of symptoms of left-sided headache intermittent right-sided weakness. He has been going on for about 2 weeks.  His other problems include hypertension, hyperlipidemia, and diabetes.  Current Medications (verified): 1)  Hydrocodone-Acetaminophen 10-325 Mg Tabs (Hydrocodone-Acetaminophen) .Marland Kitchen.. 1po Q 6 Hrs As Needed 2)  Enalapril Maleate 10 Mg Tabs (Enalapril Maleate) .... Take 1 Tablet By Mouth Once Daily 3)  Metformin Hcl 500 Mg Tb24 (Metformin Hcl) .... Take 4 Tablet By Mouth Once A Day 4)  Xanax 0.25 Mg Tabs (Alprazolam) .Marland Kitchen.. 1 By Mouth Two Times A Day As Needed 5)  Klor-Con M20 20 Meq  Tbcr (Potassium Chloride Crys Cr) .Marland Kitchen.. 1 By Mouth Qd 6)  Freestyle Test  Strp (Glucose Blood) .... Test Blood Glucose Three Times A Day 7)  Carvedilol 12.5 Mg Tabs (Carvedilol) .... Take One Tablet By Mouth Twice A Day 8)  Glimepiride 4 Mg  Tabs (Glimepiride) .Marland Kitchen.. 1 By Mouth Once Daily 9)  Aspirin 81 Mg Tbec (Aspirin) .... Take One Tablet By Mouth Daily 10)  Allopurinol 300 Mg Tabs (Allopurinol) .Marland Kitchen.. 1 By Mouth Once Daily 11)  Gabapentin 300 Mg  Caps (Gabapentin) .Marland Kitchen.. 1po Three Times A Day 12)  Study Drug 13)  Furosemide 40 Mg Tabs (Furosemide) .... Take One Tablet By Mouth Daily (Take An Extra Tab If Pt Needs It For Fluid) 14)  Triamcinolone Acetonide 0.5 % Crea (Triamcinolone Acetonide) .... Use Asd Two Times A Day As Needed  Allergies (verified): 1)  ! *  Crestor  Past History:  Past Medical History: Reviewed history from 08/30/2008 and no changes required. Diabetes mellitus, type II Obesity Depression Benign prostatic hypertrophy h/o prostate cancer Allergic rhinitis erectile dysfunction Gout  2. Coronary artery disease status post coronary bypass graft surgery     in 1992 and status post multiple percutaneous intervention with 40%     in-stent restenosis in the native right coronary and 70% in-stent     restenosis within the stent in the LAD supplied by the mammary     graft. 3. Good left ventricular function. 4. Diastolic heart failure 5. History of status post dual-mode, dual-pacing, dual-sensing     pacemaker implantation. 6. Hypertension  7. Hyperlipidemia.      Review of Systems       ROS is negative except as outlined in HPI.   Vital Signs:  Patient profile:   65 year old male Height:      71 inches Weight:      236 pounds Pulse rate:   65 / minute BP sitting:   127 / 81  (left arm) Cuff size:   large  Vitals Entered By: Burnett Kanaris, CNA (October 27, 2009 2:33 PM)  Physical Exam  Additional Exam:  Gen. Well-nourished, in no distress   Neck: No JVD, thyroid not enlarged, no carotid bruits Lungs: No tachypnea, clear without rales, rhonchi or wheezes Cardiovascular: Rhythm regular,  PMI not displaced,  heart sounds  normal, no murmurs or gallops, no peripheral edema, pulses normal in all 4 extremities. Abdomen: BS normal, abdomen soft and non-tender without masses or organomegaly, no hepatosplenomegaly. MS: No deformities, no cyanosis or clubbing   Neuro:  No focal sns   Skin:  no lesions    PPM Specifications Following MD:  Everardo Beals. Juanda Chance, MD     PPM Vendor:  Medtronic     PPM Model Number:  [id number]     PPM Serial Number:  [id number] PPM DOI:  08/28/2001      Lead 1    Location: [facility]     DOI: 08/28/2001     Model #: [id number]     Serial #: [id number]     Status: active Lead 2    Location: RV     DOI:  08/28/2001     Model #: [id number]     Serial #: [id number]     Status: active   Indications:  2 Av Block   PPM Follow Up Battery Voltage:  2.56 V     Battery Est. Longevity:  REPLACE PACER     Pacer Dependent:  Yes      Episodes Coumadin:  No  Parameters Mode:  DDD     Lower Rate Limit:  60     Upper Rate Limit:  130 Paced AV Delay:  150     Sensed AV Delay:  120 Tech Comments:  PACER REACHED ERI.  PACED AT 30.  LAST THRESHOLD 0.75 @ 0.10ms.  GEN CHANGE OUT TO BE SCHEDULED. Vella Kohler  October 27, 2009 3:32 PM  Impression & Recommendations:  Problem # 1:  DIZZINESS (ICD-780.4) He has had headache and right-sided numbness and some weakness in his upper extremity. I find no neurological signs. It is possible this could be a good and that she will. We will arrange for him to have carotid Dopplers.  Problem # 2:  PACEMAKER (ICD-V45.Marland Kitchen01) He has a DDD pacemaker that was implanted for AV block. This is a Medtronic pacemaker. He knows that he had has reverted to pacing. He is independent. Will plan to arrange placement next week.  his thresholds on the ventricular lead had been 0.7 previously.  Problem # 3:  DIASTOLIC HEART FAILURE, CHRONIC (ICD-428.32) He is euvolemic at this time appear stable.  Problem # 4:  CAD, AUTOLOGOUS BYPASS GRAFT (ICD-414.02) He has had previous bypass surgery in 1992 and has one occluded graft to the circumflex artery. He's had no chest pain and is probably stable. The following medications were removed from the medication list:    Plavix 75 Mg Tabs (Clopidogrel bisulfate) ..... One by mouth daily His updated medication list for this problem includes:    Enalapril Maleate 10 Mg Tabs (Enalapril maleate) .Marland Kitchen... Take 1 tablet by mouth once daily    Carvedilol 12.5 Mg Tabs (Carvedilol) .Marland Kitchen... Take one tablet by mouth twice a day    Aspirin 81 Mg Tbec (Aspirin) .Marland Kitchen... Take one tablet by mouth daily  Orders: TLB-BMP (Basic Metabolic Panel-BMET)  (80048-METABOL) TLB-CBC Platelet - w/Differential (85025-CBCD) TLB-PT (Protime) (85610-PTP) TLB-PTT (85730-PTTL) Carotid Duplex (Carotid Duplex) Pacer (Pacer)  The following medications were removed from the medication list:    Plavix 75 Mg Tabs (Clopidogrel bisulfate) ..... One by mouth daily His updated medication list for this problem includes:    Enalapril Maleate 10 Mg Tabs (Enalapril maleate) .Marland Kitchen... Take 1 tablet by mouth once daily    Carvedilol 12.5 Mg Tabs (  Carvedilol) .Marland Kitchen... Take one tablet by mouth twice a day    Aspirin 81 Mg Tbec (Aspirin) .Marland Kitchen... Take one tablet by mouth daily  Patient Instructions: 1)  Your physician recommends that you have lab work today: bmet/cbc/ptt/pt ( 426.0;786.05;414.01). 2)  Pacemaker generator change. 3)  Your physician has requested that you have a carotid duplex. This test is an ultrasound of the carotid arteries in your neck. It looks at blood flow through these arteries that supply the brain with blood. Allow one hour for this exam. There are no restrictions or special instructions. 4)  Your physician recommends that you continue on your current medications as directed. Please refer to the Current Medication list given to you today. 5)  Your physician recommends that you schedule a follow-up appointment in: 3 weeks.

## 2010-03-11 ENCOUNTER — Telehealth: Payer: Self-pay | Admitting: Internal Medicine

## 2010-03-11 ENCOUNTER — Ambulatory Visit (INDEPENDENT_AMBULATORY_CARE_PROVIDER_SITE_OTHER): Payer: Medicare Other | Admitting: Internal Medicine

## 2010-03-11 ENCOUNTER — Encounter: Payer: Self-pay | Admitting: Internal Medicine

## 2010-03-11 ENCOUNTER — Other Ambulatory Visit: Payer: Self-pay | Admitting: Internal Medicine

## 2010-03-11 ENCOUNTER — Encounter (INDEPENDENT_AMBULATORY_CARE_PROVIDER_SITE_OTHER): Payer: Self-pay | Admitting: *Deleted

## 2010-03-11 ENCOUNTER — Other Ambulatory Visit: Payer: Medicare Other

## 2010-03-11 DIAGNOSIS — Z1289 Encounter for screening for malignant neoplasm of other sites: Secondary | ICD-10-CM

## 2010-03-11 DIAGNOSIS — Z23 Encounter for immunization: Secondary | ICD-10-CM

## 2010-03-11 DIAGNOSIS — E119 Type 2 diabetes mellitus without complications: Secondary | ICD-10-CM

## 2010-03-11 DIAGNOSIS — E785 Hyperlipidemia, unspecified: Secondary | ICD-10-CM

## 2010-03-11 DIAGNOSIS — I10 Essential (primary) hypertension: Secondary | ICD-10-CM

## 2010-03-11 DIAGNOSIS — M542 Cervicalgia: Secondary | ICD-10-CM

## 2010-03-11 LAB — BASIC METABOLIC PANEL
BUN: 11 mg/dL (ref 6–23)
Calcium: 9.1 mg/dL (ref 8.4–10.5)
Creatinine, Ser: 0.8 mg/dL (ref 0.4–1.5)
GFR: 111.42 mL/min (ref >60.00)
Potassium: 4.4 mEq/L (ref 3.5–5.1)

## 2010-03-11 LAB — LIPID PANEL
LDL Cholesterol: 45 mg/dL (ref 0–99)
Total CHOL/HDL Ratio: 3
Triglycerides: 56 mg/dL (ref 0.0–149.0)

## 2010-03-11 LAB — MICROALBUMIN / CREATININE URINE RATIO
Microalb Creat Ratio: 1.9 mg/g (ref 0.0–30.0)
Microalb, Ur: 2.4 mg/dL — ABNORMAL HIGH (ref 0.0–1.9)

## 2010-03-16 ENCOUNTER — Encounter: Payer: Self-pay | Admitting: Internal Medicine

## 2010-03-17 ENCOUNTER — Encounter: Payer: Self-pay | Admitting: Cardiology

## 2010-03-17 ENCOUNTER — Encounter (INDEPENDENT_AMBULATORY_CARE_PROVIDER_SITE_OTHER): Payer: Medicare Other | Admitting: Cardiology

## 2010-03-17 DIAGNOSIS — E7800 Pure hypercholesterolemia, unspecified: Secondary | ICD-10-CM

## 2010-03-17 DIAGNOSIS — I2510 Atherosclerotic heart disease of native coronary artery without angina pectoris: Secondary | ICD-10-CM

## 2010-03-17 DIAGNOSIS — I10 Essential (primary) hypertension: Secondary | ICD-10-CM

## 2010-03-17 NOTE — Progress Notes (Signed)
  Phone Note Call from Patient Call back at Home Phone [phone]   Caller: Patient Call For: Corwin Levins MD Reason for Call: Talk to Doctor Details for Reason: FYI Summary of Call: Want to let pt know he sign up to recieve his diabetic testing strips form [facility], and they will be faxing over forms for md to complete. Initial call taken by: Orlan Leavens RMA,  March 11, 2010 11:23 AM  Follow-up for Phone Call        ok , will fill out when arrive Follow-up by: Corwin Levins MD,  March 11, 2010 11:24 AM  Additional Follow-up for Phone Call Additional follow up Details #1::        Patient informed Additional Follow-up by: Robin Ewing CMA Duncan Dull),  March 11, 2010 4:59 PM

## 2010-03-23 NOTE — Assessment & Plan Note (Signed)
Summary: ec6 former pt of [facility]-rs per pt/bumplist-mj/d.miller   Primary Provider:  Corwin Levins MD  CC:  check up.  History of Present Illness: 65 year old male previously followed by Dr. Juanda Chance with past medical history of coronary artery disease status post coronary artery bypass graft, previous pacemaker placement for followup. Patient had a generator change in September of 2011. Last echocardiogram in May 2010 showed an ejection fraction of 55% and moderate left atrial enlargement. There was an anteroseptal wall motion abnormality. The aortic root was mildly dilated. Last cardiac catheterization in May of 2010 showed a normal left main, a 50-70% circumflex, a totally occluded LAD, and a focal 70% right coronary artery. The saphenous vein graft to the circumflex was occluded. The LIMA to the LAD was patent. Ejection fraction was 60%. Myoview in May of 2010 showed an ejection fraction of 54%, and a fixed apical/apical septal defect. Carotid Dopplers in October of 2011 were normal. Since he was last seen the patient has dyspnea with more extreme activities but not with routine activities. It is relieved with rest. It is not associated with chest pain. There is no orthopnea, PND or pedal edema. There is no syncope or palpitations. There is no exertional chest pain.   Current Medications (verified): 1)  Hydrocodone-Acetaminophen 10-325 Mg Tabs (Hydrocodone-Acetaminophen) .Marland Kitchen.. 1po Q 6 Hrs As Needed - To Fill Mar 27, 2010 2)  Enalapril Maleate 10 Mg Tabs (Enalapril Maleate) .... Take 1 Tablet By Mouth Two Times A Day 3)  Metformin Hcl 500 Mg Tb24 (Metformin Hcl) .... Take 4 Tablet By Mouth Once A Day 4)  Xanax 0.25 Mg Tabs (Alprazolam) .Marland Kitchen.. 1 By Mouth Two Times A Day As Needed 5)  Klor-Con M20 20 Meq  Tbcr (Potassium Chloride Crys Cr) .Marland Kitchen.. 1 By Mouth Once Daily 6)  Freestyle Test  Strp (Glucose Blood) .... Test Blood Glucose Three Times A Day 7)  Carvedilol 12.5 Mg Tabs (Carvedilol) .... Take  One Tablet By Mouth Twice A Day 8)  Glimepiride 4 Mg  Tabs (Glimepiride) .Marland Kitchen.. 1 By Mouth Once Daily 9)  Aspirin 81 Mg Tbec (Aspirin) .... Take One Tablet By Mouth Daily 10)  Allopurinol 300 Mg Tabs (Allopurinol) .Marland Kitchen.. 1 By Mouth Once Daily 11)  Gabapentin 300 Mg  Caps (Gabapentin) .Marland Kitchen.. 1po Three Times A Day 12)  Improve-It Study Drug .... As Directed 13)  Furosemide 40 Mg Tabs (Furosemide) .... Take One Tablet By Mouth Daily (Take An Extra Tab If Pt Needs It For Fluid) 14)  Triamcinolone Acetonide 0.5 % Crea (Triamcinolone Acetonide) .... Use Asd Two Times A Day As Needed 15)  Flexeril 5 Mg Tabs (Cyclobenzaprine Hcl) .Marland Kitchen.. 1 By Mouth Three Times A Day As Needed 16)  Pravastatin Sodium 20 Mg Tabs (Pravastatin Sodium) .... Take One Tablet By Mouth Daily At Bedtime  Allergies: 1)  ! * Crestor  Past History:  Past Medical History: Reviewed history from 08/30/2008 and no changes required. Diabetes mellitus, type II Obesity Depression Benign prostatic hypertrophy h/o prostate cancer Allergic rhinitis erectile dysfunction Gout  2. Coronary artery disease status post coronary bypass graft surgery     in 1992 and status post multiple percutaneous intervention with 40%     in-stent restenosis in the native right coronary and 70% in-stent     restenosis within the stent in the LAD supplied by the mammary     graft. 3. Good left ventricular function. 4. Diastolic heart failure 5. History of status post dual-mode, dual-pacing, dual-sensing  pacemaker implantation. 6. Hypertension  7. Hyperlipidemia.      Past Surgical History: Reviewed history from 04/22/2007 and no changes required. Coronary artery bypass graft-1992 Permanent pacemaker s/p 9 stents to coronaries per pt s/p left arm surgury after work accident 1991 s/p right hand surgury for foreign object  Social History: Reviewed history from 01/03/2010 and no changes required. 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 Remaining systems are negative.   Vital Signs:  Patient profile:   65 year old male Height:      71 inches Weight:      238 pounds BMI:     33.31 Pulse rate:   59 / minute Resp:     16 per minute BP sitting:   115 / 67  (left arm)  Vitals Entered By: Kem Parkinson (March 17, 2010 9:08 AM)  Physical Exam  General:  Well-developed well-nourished in no acute distress.  Skin is warm and dry.  HEENT is normal.  Neck is supple. No thyromegaly.  Chest is clear to auscultation with normal expansion.  Cardiovascular exam is regular rate and rhythm.  Abdominal exam nontender or distended. No masses palpated. Extremities show no edema. neuro grossly intact    EKG  Procedure date:  03/17/2010  Findings:      AV paced.  PPM Specifications Following MD:  Everardo Beals. Juanda Chance, MD     PPM Vendor:  Medtronic     PPM Model Number:  [id number]     PPM Serial Number:  [id number] [facility] PPM DOI:  11/02/2009     PPM Implanting MD:  Everardo Beals. Juanda Chance, MD  Lead 1    Location: RA     DOI: 08/28/2001     Model #: [id number]     Serial #: [id number]     Status: active Lead 2    Location: RV     DOI: 08/28/2001     Model #: [id number]     Serial #: [id number]     Status: active  Magnet Response Rate:  BOL 85 ERI 65  Indications:  2 Av Block  Explantation Comments:  11/02/09 Medtronic [id number] H explanted  PPM Follow Up Pacer Dependent:  Yes      Episodes Coumadin:  No  Parameters Mode:  DDD     Lower Rate Limit:  60     Upper Rate Limit:  130 Paced AV Delay:  180     Sensed AV Delay:  150  Impression & Recommendations:  Problem # 1:  DIASTOLIC HEART FAILURE, CHRONIC (ICD-428.32) Patient euvolemic on examination. Continue  present medications. His updated medication list for this problem includes:    Enalapril Maleate 10 Mg Tabs (Enalapril maleate) .Marland Kitchen... Take 1 tablet by mouth two times a day    Carvedilol 12.5 Mg Tabs (Carvedilol) .Marland Kitchen... Take one tablet by mouth twice a day    Aspirin 81 Mg Tbec (Aspirin) .Marland Kitchen... Take one tablet by mouth daily    Furosemide 40 Mg Tabs (Furosemide) .Marland Kitchen... Take one tablet by mouth daily (take an extra tab if pt needs it for fluid)  His updated medication list for this problem includes:    Enalapril Maleate 10 Mg Tabs (Enalapril maleate) .Marland Kitchen... Take 1 tablet by mouth two times a day  Carvedilol 12.5 Mg Tabs (Carvedilol) .Marland Kitchen... Take one tablet by mouth twice a day    Aspirin 81 Mg Tbec (Aspirin) .Marland Kitchen... Take one tablet by mouth daily    Furosemide 40 Mg Tabs (Furosemide) .Marland Kitchen... Take one tablet by mouth daily (take an extra tab if pt needs it for fluid)  Problem # 2:  PACEMAKER, PERMANENT (ICD-V45.01) Referred to pacemaker clinic.  Problem # 3:  CAD, AUTOLOGOUS BYPASS GRAFT (ICD-414.02) Continued aspirin, ACE inhibitor, beta blocker and statin. Plan Myoview when he returns in 6 months. His updated medication list for this problem includes:    Enalapril Maleate 10 Mg Tabs (Enalapril maleate) .Marland Kitchen... Take 1 tablet by mouth two times a day    Carvedilol 12.5 Mg Tabs (Carvedilol) .Marland Kitchen... Take one tablet by mouth twice a day    Aspirin 81 Mg Tbec (Aspirin) .Marland Kitchen... Take one tablet by mouth daily  Problem # 4:  DIABETES MELLITUS, TYPE II (ICD-250.00)  His updated medication list for this problem includes:    Enalapril Maleate 10 Mg Tabs (Enalapril maleate) .Marland Kitchen... Take 1 tablet by mouth two times a day    Metformin Hcl 500 Mg Tb24 (Metformin hcl) .Marland Kitchen... Take 4 tablet by mouth once a day    Glimepiride 4 Mg Tabs (Glimepiride) .Marland Kitchen... 1 by mouth once daily    Aspirin 81 Mg Tbec (Aspirin) .Marland Kitchen... Take one tablet by mouth daily  Problem # 5:  HYPERLIPIDEMIA (ICD-272.4) Continue statin. Check liver  functions. Followed in the Prove It study. His updated medication list for this problem includes:    Pravastatin Sodium 20 Mg Tabs (Pravastatin sodium) .Marland Kitchen... Take one tablet by mouth daily at bedtime  Orders: TLB-Hepatic/Liver Function Pnl (80076-HEPATIC)  Problem # 6:  HYPERTENSION (ICD-401.9) Blood pressure controlled on present medications. Will continue. His updated medication list for this problem includes:    Enalapril Maleate 10 Mg Tabs (Enalapril maleate) .Marland Kitchen... Take 1 tablet by mouth two times a day    Carvedilol 12.5 Mg Tabs (Carvedilol) .Marland Kitchen... Take one tablet by mouth twice a day    Aspirin 81 Mg Tbec (Aspirin) .Marland Kitchen... Take one tablet by mouth daily    Furosemide 40 Mg Tabs (Furosemide) .Marland Kitchen... Take one tablet by mouth daily (take an extra tab if pt needs it for fluid)  Patient Instructions: 1)  Your physician wants you to follow-up in:6 MONTHS   You will receive a reminder letter in the mail two months in advance. If you don't receive a letter, please call our office to schedule the follow-up appointment.

## 2010-03-29 NOTE — Assessment & Plan Note (Signed)
Summary: YEARLY FU MEDICARE/LABS SAME DAY/NWS #   Vital Signs:  Patient profile:   65 year old male Height:      71 inches Weight:      236.50 pounds BMI:     33.10 O2 Sat:      97 % on Room air Temp:     97.6 degrees F oral Pulse rate:   60 / minute BP sitting:   100 / 58  (left arm) Cuff size:   large  Vitals Entered By: Zella Ball Ewing CMA Duncan Dull) (March 11, 2010 8:41 AM)  O2 Flow:  Room air  Preventive Care Screening     still declines colonoscopy  CC: Yearly/RE   Primary Care Provider:  Corwin Levins MD  CC:  Ulla Potash.  History of Present Illness: here for f/u - still with same left neck and head as  before - gabapentin cont's to help, and stable lower back pain as well Pt denies new neuro symptoms such as headache, facial or extremity weakness or pain, or bowel or bladder change, fever, wt loss, gait change, fall, trauma or inury.    Pt denies CP, worsening sob, doe, wheezing, orthopnea, pnd, worsening LE edema, palps, dizziness or syncope    Pt denies polydipsia, polyuria, or low sugar symptoms such as shakiness improved with eating.  Overall good compliance with meds, trying to follow low chol, DM diet, wt stable, little excercise however  CBG's at home in the lower 100's.  No fever, wt loss, night sweats, loss of appetite or other constitutional symptoms  Overall good compliance with meds, and good tolerability.  Denies worsening depressive symptoms, suicidal ideation, or panic, though has some ongoing anxiety, but no worse.   Problems Prior to Update: 1)  Neck Pain, Left  (ICD-723.1) 2)  Dizziness  (ICD-780.4) 3)  Dyspnea On Exertion  (ICD-786.09) 4)  Fatigue, Acute  (ICD-780.79) 5)  Preventive Health Care  (ICD-V70.0) 6)  Psa, Increased  (ICD-790.93) 7)  Special Screening Malig Neoplasms Other Sites  (ICD-V76.49) 8)  Fatigue  (ICD-780.79) 9)  Acute Gouty Arthropathy  (ICD-274.01) 10)  Peripheral Edema  (ICD-782.3) 11)  Laceration, Finger  (ICD-883.0) 12)   Encounter For Long-term Use of Other Medications  (ICD-V58.69) 13)  Atrioventricular Block, Complete  (ICD-426.0) 14)  Dyspnea  (ICD-786.05) 15)  Diastolic Heart Failure, Chronic  (ICD-428.32) 16)  Leg Pain, Bilateral  (ICD-729.5) 17)  Knee Pain, Bilateral  (ICD-719.46) 18)  Acute Gouty Arthropathy  (ICD-274.01) 19)  Pacemaker, Permanent  (ICD-V45.01) 20)  Cad, Autologous Bypass Graft  (ICD-414.02) 21)  Insomnia-sleep Disorder-unspec  (ICD-780.52) 22)  Lumbar Radiculopathy, Right  (ICD-724.4) 23)  Special Screening Malignant Neoplasm of Prostate  (ICD-V76.44) 24)  Gout  (ICD-274.9) 25)  Fatigue  (ICD-780.79) 26)  Ankle Sprain  (ICD-845.00) 27)  Rash-nonvesicular  (ICD-782.1) 28)  Erectile Dysfunction  (ICD-302.72) 29)  Allergic Rhinitis  (ICD-477.9) 30)  Neoplasm, Malignant, Prostate  (ICD-185) 31)  Family History Diabetes 1st Degree Relative  (ICD-V18.0) 32)  Family History of Cad Male 1st Degree Relative <50  (ICD-V17.3) 33)  Coronary Artery Disease  (ICD-414.00) 34)  Benign Prostatic Hypertrophy  (ICD-600.00) 35)  Depression  (ICD-311) 36)  Overweight  (ICD-278.02) 37)  Hypertension  (ICD-401.9) 38)  Hyperlipidemia  (ICD-272.4) 39)  Diabetes Mellitus, Type II  (ICD-250.00) 40)  Congestive Heart Failure  (ICD-428.0)  Medications Prior to Update: 1)  Hydrocodone-Acetaminophen 10-325 Mg Tabs (Hydrocodone-Acetaminophen) .Marland Kitchen.. 1po Q 6 Hrs As Needed 2)  Enalapril Maleate 10 Mg Tabs (Enalapril  Maleate) .... Take 1 Tablet By Mouth Two Times A Day 3)  Metformin Hcl 500 Mg Tb24 (Metformin Hcl) .... Take 4 Tablet By Mouth Once A Day 4)  Xanax 0.25 Mg Tabs (Alprazolam) .Marland Kitchen.. 1 By Mouth Two Times A Day As Needed 5)  Klor-Con M20 20 Meq  Tbcr (Potassium Chloride Crys Cr) .Marland Kitchen.. 1 By Mouth Qd 6)  Freestyle Test  Strp (Glucose Blood) .... Test Blood Glucose Three Times A Day 7)  Carvedilol 12.5 Mg Tabs (Carvedilol) .... Take One Tablet By Mouth Twice A Day 8)  Glimepiride 4 Mg  Tabs  (Glimepiride) .Marland Kitchen.. 1 By Mouth Once Daily 9)  Aspirin 81 Mg Tbec (Aspirin) .... Take One Tablet By Mouth Daily 10)  Allopurinol 300 Mg Tabs (Allopurinol) .Marland Kitchen.. 1 By Mouth Once Daily 11)  Gabapentin 300 Mg  Caps (Gabapentin) .Marland Kitchen.. 1po Three Times A Day 12)  Improve-It Study Drug .... As Directed 13)  Furosemide 40 Mg Tabs (Furosemide) .... Take One Tablet By Mouth Daily (Take An Extra Tab If Pt Needs It For Fluid) 14)  Triamcinolone Acetonide 0.5 % Crea (Triamcinolone Acetonide) .... Use Asd Two Times A Day As Needed 15)  Flexeril 5 Mg Tabs (Cyclobenzaprine Hcl) .Marland Kitchen.. 1 By Mouth Three Times A Day As Needed  Current Medications (verified): 1)  Hydrocodone-Acetaminophen 10-325 Mg Tabs (Hydrocodone-Acetaminophen) .Marland Kitchen.. 1po Q 6 Hrs As Needed - To Fill Mar 27, 2010 2)  Enalapril Maleate 10 Mg Tabs (Enalapril Maleate) .... Take 1 Tablet By Mouth Two Times A Day 3)  Metformin Hcl 500 Mg Tb24 (Metformin Hcl) .... Take 4 Tablet By Mouth Once A Day 4)  Xanax 0.25 Mg Tabs (Alprazolam) .Marland Kitchen.. 1 By Mouth Two Times A Day As Needed 5)  Klor-Con M20 20 Meq  Tbcr (Potassium Chloride Crys Cr) .Marland Kitchen.. 1 By Mouth Once Daily 6)  Freestyle Test  Strp (Glucose Blood) .... Test Blood Glucose Three Times A Day 7)  Carvedilol 12.5 Mg Tabs (Carvedilol) .... Take One Tablet By Mouth Twice A Day 8)  Glimepiride 4 Mg  Tabs (Glimepiride) .Marland Kitchen.. 1 By Mouth Once Daily 9)  Aspirin 81 Mg Tbec (Aspirin) .... Take One Tablet By Mouth Daily 10)  Allopurinol 300 Mg Tabs (Allopurinol) .Marland Kitchen.. 1 By Mouth Once Daily 11)  Gabapentin 300 Mg  Caps (Gabapentin) .Marland Kitchen.. 1po Three Times A Day 12)  Improve-It Study Drug .... As Directed 13)  Furosemide 40 Mg Tabs (Furosemide) .... Take One Tablet By Mouth Daily (Take An Extra Tab If Pt Needs It For Fluid) 14)  Triamcinolone Acetonide 0.5 % Crea (Triamcinolone Acetonide) .... Use Asd Two Times A Day As Needed 15)  Flexeril 5 Mg Tabs (Cyclobenzaprine Hcl) .Marland Kitchen.. 1 By Mouth Three Times A Day As Needed 16)   Pravastatin Sodium 20 Mg Tabs (Pravastatin Sodium) .... Take One Tablet By Mouth Daily At Bedtime  Allergies (verified): 1)  ! * Crestor  Past History:  Past Medical History: Last updated: 08/30/2008 Diabetes mellitus, type II Obesity Depression Benign prostatic hypertrophy h/o prostate cancer Allergic rhinitis erectile dysfunction Gout  2. Coronary artery disease status post coronary bypass graft surgery     in 1992 and status post multiple percutaneous intervention with 40%     in-stent restenosis in the native right coronary and 70% in-stent     restenosis within the stent in the LAD supplied by the mammary     graft. 3. Good left ventricular function. 4. Diastolic heart failure 5. History of status post dual-mode, dual-pacing, dual-sensing  pacemaker implantation. 6. Hypertension  7. Hyperlipidemia.      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   Head:  normocephalic and atraumatic.  , no sweling, erythema or rash Eyes:  vision grossly intact, pupils equal, and pupils round.   Ears:  R ear normal and L ear normal.   Nose:  no external deformity and no nasal discharge.   Mouth:  no gingival abnormalities and pharynx pink and moist.   Neck:  supple and no masses.   Lungs:  normal respiratory effort and normal breath sounds.   Heart:  normal rate and regular rhythm.   Msk:  no joint tenderness and no joint swelling.  , neck FROM, NT , no rash and nontender  including the left postlat neck, head and periaural area Extremities:  no edema, no erythema  Neurologic:  cranial nerves II-XII intact, strength normal in all extremities, and gait normal.     Impression & Recommendations:  Problem # 1:  NECK PAIN, LEFT (ICD-723.1)  His updated medication list for this problem includes:    Hydrocodone-acetaminophen 10-325 Mg Tabs (Hydrocodone-acetaminophen) .Marland Kitchen... 1po q 6 hrs as needed - to fill Mar 27, 2010    Aspirin 81 Mg Tbec (Aspirin) .Marland Kitchen... Take one tablet by mouth daily    Flexeril 5 Mg Tabs (Cyclobenzaprine hcl) .Marland Kitchen... 1 by mouth three times a day as needed I suspect c/w left occipital neuralgia - Continue all previous medications as before this visit   Problem # 2:  HYPERTENSION (ICD-401.9)  His updated medication list for this problem includes:    Enalapril Maleate 10 Mg Tabs (Enalapril maleate) .Marland Kitchen... Take 1 tablet by mouth two times a day    Carvedilol 12.5 Mg Tabs (Carvedilol) .Marland Kitchen... Take one tablet by mouth twice a day    Furosemide 40 Mg Tabs (Furosemide) .Marland Kitchen... Take one tablet by mouth daily (take an extra tab if pt needs it for fluid)  BP today: 100/58 Prior BP: 100/62 (01/03/2010)  Labs Reviewed: K+: 4.2 (01/03/2010) Creat: : 0.9 (01/03/2010)   Chol: 134 (01/03/2010)   HDL: 36.90 (01/03/2010)   LDL: 75 (01/03/2010)   TG: 109.0 (01/03/2010) stable overall by hx and exam, ok to continue meds/tx as is   Problem # 3:  HYPERLIPIDEMIA (ICD-272.4)  His updated medication list for this problem includes:    Pravastatin Sodium 20 Mg Tabs (Pravastatin sodium) .Marland Kitchen... Take one tablet by mouth daily at bedtime  Labs Reviewed: SGOT: 28 (08/13/2009)   SGPT: 30 (08/13/2009)   HDL:36.90 (01/03/2010), 44.10 (03/09/2009)  LDL:75 (01/03/2010), 46 (03/09/2009)  Chol:134 (01/03/2010), 104 (03/09/2009)  Trig:109.0 (01/03/2010), 69.0 (03/09/2009) stable overall by hx and exam, ok to continue meds/tx as is , Pt to continue diet efforts, good med tolerance;  -  goal LDL less than 70   Problem # 4:  DIABETES MELLITUS, TYPE II (ICD-250.00)  His updated medication list for this problem includes:    Enalapril Maleate 10 Mg Tabs (Enalapril maleate) .Marland Kitchen... Take 1 tablet by mouth two times  a day    Metformin Hcl 500 Mg Tb24 (Metformin hcl) .Marland Kitchen... Take 4 tablet by mouth once a day    Glimepiride 4 Mg Tabs (Glimepiride) .Marland Kitchen... 1 by mouth once daily    Aspirin 81 Mg Tbec (Aspirin) .Marland Kitchen... Take one tablet by mouth daily  Orders: TLB-BMP (Basic Metabolic Panel-BMET) (80048-METABOL) TLB-A1C / Hgb A1C (Glycohemoglobin) (83036-A1C) TLB-Lipid Panel (80061-LIPID) TLB-Microalbumin/Creat Ratio, Urine (82043-MALB)  Labs Reviewed: Creat: 0.9 (01/03/2010)    Reviewed HgBA1c results: 6.8 (01/03/2010)  6.9 (08/13/2009) stable overall by hx and exam, ok to continue meds/tx as is . Pt to cont DM diet, excercise, wt control efforts; to check labs today   Complete Medication List: 1)  Hydrocodone-acetaminophen 10-325 Mg Tabs (Hydrocodone-acetaminophen) .Marland Kitchen.. 1po q 6 hrs as needed - to fill Mar 27, 2010 2)  Enalapril Maleate 10 Mg Tabs (Enalapril maleate) .... Take 1 tablet by mouth two times a day 3)  Metformin Hcl 500 Mg Tb24 (Metformin hcl) .... Take 4 tablet by mouth once a day 4)  Xanax 0.25 Mg Tabs (Alprazolam) .Marland Kitchen.. 1 by mouth two times a day as needed 5)  Klor-con M20 20 Meq Tbcr (Potassium chloride crys cr) .Marland Kitchen.. 1 by mouth once daily 6)  Freestyle Test Strp (Glucose blood) .... Test blood glucose three times a day 7)  Carvedilol 12.5 Mg Tabs (Carvedilol) .... Take one tablet by mouth twice a day 8)  Glimepiride 4 Mg Tabs (Glimepiride) .Marland Kitchen.. 1 by mouth once daily 9)  Aspirin 81 Mg Tbec (Aspirin) .... Take one tablet by mouth daily 10)  Allopurinol 300 Mg Tabs (Allopurinol) .Marland Kitchen.. 1 by mouth once daily 11)  Gabapentin 300 Mg Caps (Gabapentin) .Marland Kitchen.. 1po three times a day 12)  Improve-it Study Drug  .... As directed 13)  Furosemide 40 Mg Tabs (Furosemide) .... Take one  tablet by mouth daily (take an extra tab if pt needs it for fluid) 14)  Triamcinolone Acetonide 0.5 % Crea (Triamcinolone acetonide) .... Use asd two times a day as needed 15)  Flexeril 5 Mg Tabs (Cyclobenzaprine hcl) .Marland Kitchen.. 1 by mouth three times a day as needed 16)  Pravastatin Sodium 20 Mg Tabs (Pravastatin sodium) .... Take one tablet by mouth daily at bedtime  Other Orders: Pneumococcal Vaccine (16109) Admin 1st Vaccine (60454) TLB-PSA (Prostate Specific Antigen) (84153-PSA)  Patient Instructions: 1)  you had the pneumonia shot today 2)  Please go to the Lab in the basement for your blood and/or urine tests today 3)  Please call the number on the [facility] Card for results of your testing  4)  Continue all previous medications as before this visit  5)  Please schedule a follow-up appointment in 6 months. Prescriptions: FLEXERIL 5 MG TABS (CYCLOBENZAPRINE HCL) 1 by mouth three times a day as needed  #60 x 1   Entered and Authorized by:   Corwin Levins MD   Signed by:   Corwin Levins MD on 03/11/2010   Method used:   Print then Give to Patient   RxID:   [id number] FUROSEMIDE 40 MG TABS (FUROSEMIDE) Take one tablet by mouth daily (take an extra tab if pt needs it for fluid)  #90 x 3   Entered and Authorized by:   Corwin Levins MD   Signed by:   Corwin Levins MD on 03/11/2010   Method used:   Print then Give to Patient   RxID:   [id number] GABAPENTIN 300 MG  CAPS (GABAPENTIN) 1po three times a day  #  270 x 1   Entered and Authorized by:   Corwin Levins MD   Signed by:   Corwin Levins MD on 03/11/2010   Method used:   Print then Give to Patient   RxID:   [id number] ALLOPURINOL 300 MG TABS (ALLOPURINOL) 1 by mouth once daily  #90 x 3   Entered and Authorized by:   Corwin Levins MD   Signed by:   Corwin Levins MD on 03/11/2010   Method used:   Print then Give to Patient   RxID:   [id number] GLIMEPIRIDE 4 MG  TABS (GLIMEPIRIDE) 1 by mouth once daily  #90 x 3   Entered and  Authorized by:   Corwin Levins MD   Signed by:   Corwin Levins MD on 03/11/2010   Method used:   Print then Give to Patient   RxID:   [id number] CARVEDILOL 12.5 MG TABS (CARVEDILOL) Take one tablet by mouth twice a day  #180 x 3   Entered and Authorized by:   Corwin Levins MD   Signed by:   Corwin Levins MD on 03/11/2010   Method used:   Print then Give to Patient   RxID:   [id number] FREESTYLE TEST  STRP (GLUCOSE BLOOD) test blood glucose three times a day  #100 x 3   Entered and Authorized by:   Corwin Levins MD   Signed by:   Corwin Levins MD on 03/11/2010   Method used:   Print then Give to Patient   RxID:   [id number] KLOR-CON M20 20 MEQ  TBCR (POTASSIUM CHLORIDE CRYS CR) 1 by mouth once daily  #90 x 3   Entered and Authorized by:   Corwin Levins MD   Signed by:   Corwin Levins MD on 03/11/2010   Method used:   Print then Give to Patient   RxID:   [id number] XANAX 0.25 MG TABS (ALPRAZOLAM) 1 by mouth two times a day as needed  #60 x 2   Entered and Authorized by:   Corwin Levins MD   Signed by:   Corwin Levins MD on 03/11/2010   Method used:   Print then Give to Patient   RxID:   [id number] METFORMIN HCL 500 MG TB24 (METFORMIN HCL) Take 4 tablet by mouth once a day  #360 x 3   Entered and Authorized by:   Corwin Levins MD   Signed by:   Corwin Levins MD on 03/11/2010   Method used:   Print then Give to Patient   RxID:   [id number] ENALAPRIL MALEATE 10 MG TABS (ENALAPRIL MALEATE) Take 1 tablet by mouth two times a day  #180 x 3   Entered and Authorized by:   Corwin Levins MD   Signed by:   Corwin Levins MD on 03/11/2010   Method used:   Print then Give to Patient   RxID:   [id number] HYDROCODONE-ACETAMINOPHEN 10-325 MG TABS (HYDROCODONE-ACETAMINOPHEN) 1po q 6 hrs as needed - to fill Mar 27, 2010  #120 x 2   Entered and Authorized by:   Corwin Levins MD   Signed by:   Corwin Levins MD on 03/11/2010   Method used:   Print then Give to Patient   RxID:    [id number]    Orders Added: 1)  Pneumococcal Vaccine [42353] 2)  Admin 1st Vaccine [90471] 3)  TLB-PSA (Prostate Specific Antigen) [61443-XVQ] 4)  TLB-BMP (Basic Metabolic Panel-BMET) [80048-METABOL] 5)  TLB-A1C / Hgb A1C (Glycohemoglobin) [83036-A1C] 6)  TLB-Lipid Panel [80061-LIPID] 7)  TLB-Microalbumin/Creat Ratio, Urine [82043-MALB] 8)  Est. Patient Level IV [88416]   Immunizations Administered:  Pneumonia Vaccine:    Vaccine Type: Pneumovax    Site: left deltoid    Mfr: Merck    Dose: 0.5 ml    Route: IM    Given by: Zella Ball Ewing CMA (AAMA)    Exp. Date: 06/24/2011    Lot #: [id number]    VIS given: 01/04/09 version given March 11, 2010.   Immunizations Administered:  Pneumonia Vaccine:    Vaccine Type: Pneumovax    Site: left deltoid    Mfr: Merck    Dose: 0.5 ml    Route: IM    Given by: Zella Ball Ewing CMA (AAMA)    Exp. Date: 06/24/2011    Lot #: [id number]    VIS given: 01/04/09 version given March 11, 2010.

## 2010-03-29 NOTE — Medication Information (Signed)
Summary: Order for Diabetic Supplies / [facility]  Order for Diabetic Supplies / [facility]   Imported By: Lennie Odor 03/23/2010 12:50:30  _____________________________________________________________________  External Attachment:    Type:   Image     Comment:   External Document

## 2010-03-29 NOTE — Medication Information (Signed)
Summary: Diabetes Supplies/[facility]  Diabetes Supplies/[facility]   Imported By: Sherian Rein 03/21/2010 08:25:17  _____________________________________________________________________  External Attachment:    Type:   Image     Comment:   External Document

## 2010-04-05 ENCOUNTER — Encounter: Payer: Self-pay | Admitting: Internal Medicine

## 2010-04-05 ENCOUNTER — Encounter (INDEPENDENT_AMBULATORY_CARE_PROVIDER_SITE_OTHER): Payer: Medicare Other | Admitting: Internal Medicine

## 2010-04-05 DIAGNOSIS — I10 Essential (primary) hypertension: Secondary | ICD-10-CM

## 2010-04-05 DIAGNOSIS — I2510 Atherosclerotic heart disease of native coronary artery without angina pectoris: Secondary | ICD-10-CM

## 2010-04-05 DIAGNOSIS — I495 Sick sinus syndrome: Secondary | ICD-10-CM

## 2010-04-12 NOTE — Assessment & Plan Note (Signed)
Summary: pacer/phys ck /mt   Visit Type:  Follow-up Primary Provider:  Corwin Levins [name]   History of Present Illness: [name] is referred today for ongoing evaluation and management of his Permanent PM. The patient has CAD, and is s/p CABG. He has a h/o HTN and symptomatic bradycardia and is s/p PPM in 2003 with subsequent generator change in 2003. He is followed by Dr. Jens Som.  Since his last PM generator change, he has done well. He continues to exercise daily, lifting weights and walking. No c/p or sob or peirpheral edema.  Current Medications (verified): 1)  Hydrocodone-Acetaminophen 10-325 Mg Tabs (Hydrocodone-Acetaminophen) .Marland Kitchen.. 1po Q 6 Hrs As Needed - To Fill Mar 27, 2010 2)  Enalapril Maleate 10 Mg Tabs (Enalapril Maleate) .... Take 1 Tablet By Mouth Two Times A Day 3)  Metformin Hcl 500 Mg Tb24 (Metformin Hcl) .... Take 4 Tablet By Mouth Once A Day 4)  Xanax 0.25 Mg Tabs (Alprazolam) .Marland Kitchen.. 1 By Mouth Two Times A Day As Needed 5)  Klor-Con M20 20 Meq  Tbcr (Potassium Chloride Crys Cr) .Marland Kitchen.. 1 By Mouth Once Daily 6)  Freestyle Test  Strp (Glucose Blood) .... Test Blood Glucose Three Times A Day 7)  Carvedilol 12.5 Mg Tabs (Carvedilol) .... Take One Tablet By Mouth Twice A Day 8)  Glimepiride 4 Mg  Tabs (Glimepiride) .Marland Kitchen.. 1 By Mouth Once Daily 9)  Aspirin 81 Mg Tbec (Aspirin) .... Take One Tablet By Mouth Daily 10)  Allopurinol 300 Mg Tabs (Allopurinol) .Marland Kitchen.. 1 By Mouth Once Daily 11)  Gabapentin 300 Mg  Caps (Gabapentin) .Marland Kitchen.. 1po Three Times A Day 12)  Improve-It Study Drug .... As Directed 13)  Furosemide 40 Mg Tabs (Furosemide) .... Take One Tablet By Mouth Daily (Take An Extra Tab If Pt Needs It For Fluid) 14)  Triamcinolone Acetonide 0.5 % Crea (Triamcinolone Acetonide) .... Use Asd Two Times A Day As Needed 15)  Flexeril 5 Mg Tabs (Cyclobenzaprine Hcl) .Marland Kitchen.. 1 By Mouth Three Times A Day As Needed  Allergies: 1)  ! * Crestor  Past History:  Past Medical History: Last  updated: 08/30/2008 Diabetes mellitus, type II Obesity Depression Benign prostatic hypertrophy h/o prostate cancer Allergic rhinitis erectile dysfunction Gout  2. Coronary artery disease status post coronary bypass graft surgery     in 1992 and status post multiple percutaneous intervention with 40%     in-stent restenosis in the native right coronary and 70% in-stent     restenosis within the stent in the LAD supplied by the mammary     graft. 3. Good left ventricular function. 4. Diastolic heart failure 5. History of status post dual-mode, dual-pacing, dual-sensing     pacemaker implantation. 6. Hypertension  7. Hyperlipidemia.     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  Vital Signs:  Patient profile:  65 year old male Height:      71 inches Weight:      236 pounds BMI:     33.03 Pulse rate:   64 / minute BP sitting:   110 / 70  (right arm)  Vitals Entered By: [name] CMA (April 05, 2010 8:43 AM)  Physical Exam  General:  Well-developed well-nourished in no acute distress.  Skin is warm and dry.  HEENT is normal.  Neck is supple. No thyromegaly.  Chest is clear to auscultation with normal expansion. Well healed PPM incision. Cardiovascular exam is regular rate and  rhythm.  Abdominal exam nontender or distended. No masses palpated. Extremities show no edema. neuro grossly intact    PPM Specifications Following [name]:  Randall Beals. Juanda Chance, [name]     PPM Vendor:  Medtronic     PPM Model Number:  [id number]     PPM Serial Number:  [id number] [facility] PPM DOI:  11/02/2009     PPM Implanting [name]:  Randall Beals. Juanda Chance, [name]  Lead 1    Location: RA     DOI: 08/28/2001     Model #: [id number]     Serial #: [id number]     Status: active Lead 2    Location: RV     DOI: 08/28/2001     Model #: [id number]     Serial #: [id number]     Status: active  Magnet Response Rate:  BOL 85 ERI 65  Indications:  2 Av Block  Explantation Comments:  11/02/09 Medtronic [id number] H explanted  PPM Follow Up Pacer Dependent:  Yes      Episodes Coumadin:  No  Parameters Mode:  DDD     Lower Rate Limit:  60     Upper Rate Limit:  130 Paced AV Delay:  180     Sensed AV Delay:  150 [name] Comments:  Agree with above.  Impression & Recommendations:  Problem # 1:  PACEMAKER, PERMANENT (ICD-V45.01) His device is working normally. Will recheck in several months.  Problem # 2:  DIASTOLIC HEART FAILURE, CHRONIC (ICD-428.32) He is currently class 1. Will follow. His updated medication list for this problem includes:    Enalapril Maleate 10 Mg Tabs (Enalapril maleate) .Marland Kitchen... Take 1 tablet by mouth two times a day    Carvedilol 12.5 Mg Tabs (Carvedilol) .Marland Kitchen... Take one tablet by mouth twice a day    Aspirin 81 Mg Tbec (Aspirin) .Marland Kitchen... Take one tablet by mouth daily    Furosemide 40 Mg Tabs (Furosemide) .Marland Kitchen... Take one tablet by mouth daily (take an extra tab if pt needs it for fluid)  Problem # 3:  HYPERTENSION (ICD-401.9) His blood pressure is well controlled. Will follow. He will continue his current meds. His updated medication list for this problem includes:    Enalapril Maleate 10 Mg Tabs (Enalapril maleate) .Marland Kitchen... Take 1 tablet by mouth two times a day    Carvedilol 12.5 Mg Tabs (Carvedilol) .Marland Kitchen... Take one  tablet by mouth twice a day    Aspirin 81 Mg Tbec (Aspirin) .Marland Kitchen... Take one tablet by mouth daily    Furosemide 40 Mg Tabs (Furosemide) .Marland Kitchen... Take one tablet by mouth daily (take an extra tab if pt needs it for fluid)  Patient Instructions: 1)  Your physician recommends that you schedule a follow-up appointment in: December 2012 2)  Your physician recommends that you continue on your current medications as directed. Please refer to the Current Medication list given to you today.

## 2010-04-13 LAB — GLUCOSE, CAPILLARY: Glucose-Capillary: 114 mg/dL — ABNORMAL HIGH (ref 70–99)

## 2010-04-19 NOTE — Cardiovascular Report (Signed)
Summary: Office Visit   Office Visit   Imported By: Roderic Ovens 04/11/2010 14:06:29  _____________________________________________________________________  External Attachment:    Type:   Image     Comment:   External Document

## 2010-05-10 LAB — APTT: aPTT: 26 seconds (ref 24–37)

## 2010-05-10 LAB — HEPARIN LEVEL (UNFRACTIONATED)
Heparin Unfractionated: 0.79 IU/mL — ABNORMAL HIGH (ref 0.30–0.70)
Heparin Unfractionated: 0.97 IU/mL — ABNORMAL HIGH (ref 0.30–0.70)
Heparin Unfractionated: <0.1 IU/mL — ABNORMAL LOW (ref 0.30–0.70)

## 2010-05-10 LAB — POCT I-STAT 3, ART BLOOD GAS (G3+)
O2 Saturation: 66 %
O2 Saturation: 97 %
pCO2 arterial: 39 mmHg (ref 35.0–45.0)
pCO2 arterial: 43.4 mmHg (ref 35.0–45.0)
pH, Arterial: 7.387 (ref 7.350–7.450)
pH, Arterial: 7.409 (ref 7.350–7.450)
pO2, Arterial: 35 mmHg — CL (ref 80.0–100.0)

## 2010-05-10 LAB — COMPREHENSIVE METABOLIC PANEL
AST: 27 U/L (ref 0–37)
Albumin: 3.8 g/dL (ref 3.5–5.2)
Chloride: 108 mEq/L (ref 96–112)
Creatinine, Ser: 0.87 mg/dL (ref 0.4–1.5)
GFR calc Af Amer: >60 mL/min (ref >60)
Potassium: 4.6 mEq/L (ref 3.5–5.1)
Sodium: 139 mEq/L (ref 135–145)
Total Bilirubin: 0.7 mg/dL (ref 0.3–1.2)

## 2010-05-10 LAB — CARDIAC PANEL(CRET KIN+CKTOT+MB+TROPI)
Total CK: 342 U/L — ABNORMAL HIGH (ref 7–232)
Troponin I: 0.02 ng/mL (ref 0.00–0.06)

## 2010-05-10 LAB — TSH: TSH: 0.683 u[IU]/mL (ref 0.350–4.500)

## 2010-05-10 LAB — CBC
Hemoglobin: 12.6 g/dL — ABNORMAL LOW (ref 13.0–17.0)
Hemoglobin: 14.2 g/dL (ref 13.0–17.0)
MCHC: 34 g/dL (ref 30.0–36.0)
MCV: 89.5 fL (ref 78.0–100.0)
Platelets: 160 10*3/uL (ref 150–400)
RBC: 4.1 MIL/uL — ABNORMAL LOW (ref 4.22–5.81)
RBC: 4.36 MIL/uL (ref 4.22–5.81)
RBC: 4.67 MIL/uL (ref 4.22–5.81)
WBC: 5.5 10*3/uL (ref 4.0–10.5)

## 2010-05-10 LAB — BASIC METABOLIC PANEL
CO2: 26 mEq/L (ref 19–32)
Calcium: 8.8 mg/dL (ref 8.4–10.5)
Creatinine, Ser: 0.83 mg/dL (ref 0.4–1.5)
GFR calc Af Amer: >60 mL/min (ref >60)
GFR calc non Af Amer: >60 mL/min (ref >60)
GFR calc non Af Amer: >60 mL/min (ref >60)
Potassium: 3.8 mEq/L (ref 3.5–5.1)
Sodium: 136 mEq/L (ref 135–145)
Sodium: 138 mEq/L (ref 135–145)

## 2010-05-10 LAB — PROTIME-INR
INR: 1 (ref 0.00–1.49)
INR: 1 (ref 0.00–1.49)

## 2010-05-10 LAB — GLUCOSE, CAPILLARY
Glucose-Capillary: 136 mg/dL — ABNORMAL HIGH (ref 70–99)
Glucose-Capillary: 139 mg/dL — ABNORMAL HIGH (ref 70–99)
Glucose-Capillary: 144 mg/dL — ABNORMAL HIGH (ref 70–99)
Glucose-Capillary: 241 mg/dL — ABNORMAL HIGH (ref 70–99)

## 2010-05-10 LAB — CK TOTAL AND CKMB (NOT AT ARMC): Relative Index: 3.6 — ABNORMAL HIGH (ref 0.0–2.5)

## 2010-05-10 LAB — HEMOGLOBIN A1C: Hgb A1c MFr Bld: 7.4 % — ABNORMAL HIGH (ref 4.6–6.1)

## 2010-05-10 LAB — LIPID PANEL: Cholesterol: 132 mg/dL (ref 0–200)

## 2010-05-10 LAB — BRAIN NATRIURETIC PEPTIDE: Pro B Natriuretic peptide (BNP): <30 pg/mL (ref 0.0–100.0)

## 2010-05-10 LAB — D-DIMER, QUANTITATIVE: D-Dimer, Quant: 0.32 ug/mL-FEU (ref 0.00–0.48)

## 2010-05-27 ENCOUNTER — Other Ambulatory Visit: Payer: Self-pay | Admitting: Internal Medicine

## 2010-05-30 ENCOUNTER — Ambulatory Visit (INDEPENDENT_AMBULATORY_CARE_PROVIDER_SITE_OTHER): Payer: Medicare Other | Admitting: Internal Medicine

## 2010-05-30 ENCOUNTER — Encounter: Payer: Self-pay | Admitting: Internal Medicine

## 2010-05-30 VITALS — BP 110/80 | HR 67 | Temp 98.3°F | Ht 71.0 in | Wt 232.0 lb

## 2010-05-30 DIAGNOSIS — E119 Type 2 diabetes mellitus without complications: Secondary | ICD-10-CM

## 2010-05-30 DIAGNOSIS — M5416 Radiculopathy, lumbar region: Secondary | ICD-10-CM

## 2010-05-30 DIAGNOSIS — IMO0002 Reserved for concepts with insufficient information to code with codable children: Secondary | ICD-10-CM

## 2010-05-30 DIAGNOSIS — Z0000 Encounter for general adult medical examination without abnormal findings: Secondary | ICD-10-CM | POA: Insufficient documentation

## 2010-05-30 DIAGNOSIS — E785 Hyperlipidemia, unspecified: Secondary | ICD-10-CM

## 2010-05-30 DIAGNOSIS — I10 Essential (primary) hypertension: Secondary | ICD-10-CM

## 2010-05-30 HISTORY — DX: Radiculopathy, lumbar region: M54.16

## 2010-05-30 MED ORDER — OXYCODONE HCL 5 MG PO TABS: 5.0000 mg | ORAL_TABLET | ORAL | Status: AC | PRN

## 2010-05-30 MED ORDER — PREDNISONE 10 MG PO TABS: 10.0000 mg | ORAL_TABLET | Freq: Every day | ORAL | Status: AC

## 2010-05-30 MED ORDER — METHYLPREDNISOLONE ACETATE 80 MG/ML IJ SUSP
120.0000 mg | Freq: Once | INTRAMUSCULAR | Status: AC
  Administered 2010-05-30: 120 mg via INTRAMUSCULAR

## 2010-05-30 NOTE — Assessment & Plan Note (Signed)
stable overall by hx and exam, most recent lab reviewed with pt, and pt to continue medical treatment as before  Lab Results  Component Value Date   HGBA1C 7.1* 03/11/2010

## 2010-05-30 NOTE — Assessment & Plan Note (Signed)
With new onset LLE weakness on exam, for depomedrol IM , predpack for home, and pain med, but pt wants to wait on results of treatment for about 1 wk to see if can get by without the MRI if pain and weakness improve;  Will need MRI and/or NS if does not improve

## 2010-05-30 NOTE — Patient Instructions (Addendum)
You had the steroid shot today Take all new medications as prescribed Continue all other medications as before Please return in 3 mo , or sooner if needed

## 2010-05-30 NOTE — Assessment & Plan Note (Signed)
stable overall by hx and exam, most recent lab reviewed with pt, and pt to continue medical treatment as before  BP Readings from Last 3 Encounters:  05/30/10 110/80  04/05/10 110/70  03/17/10 115/67

## 2010-05-30 NOTE — Assessment & Plan Note (Signed)
stable overall by hx and exam, most recent lab reviewed with pt, and pt to continue medical treatment as before  Lab Results  Component Value Date   LDLCALC 45 03/11/2010

## 2010-05-30 NOTE — Progress Notes (Signed)
Subjective:    Patient ID: [name], male    DOB: 11-04-1945, 66 y.o.   MRN: [id number]  HPI  Here to f/u - Pt continues to have recurring LBP with increased  in severity for 4-5 days, worse to get up from chair or move quickly, better to lie down or sit in a straight back chair, not better with the back brace, and without bowel or bladder change, fever, wt loss,  worsening LE pain/numbness/weakness, gait change or falls.  Has known ongoing lumbar disc dz, and was rec'd for surgury, but was not cleared by cardiology as too high risk;  Also had several ESI to the lower back with variable improvement,  But had essentially well for over 2 yrs without severe pain.  Pt denies chest pain, increased sob or doe, wheezing, orthopnea, PND, increased LE swelling, palpitations, dizziness or syncope.  Pt denies new neurological symptoms such as new headache, or facial or extremity weakness or numbness   Pt denies polydipsia, polyuria, or low sugar symptoms such as weakness or confusion improved with po intake.  Pt states overall good compliance with meds, trying to follow lower cholesterol, diabetic diet, wt overall stable but little exercise however.   CBG's in lower 100's.   Pt denies fever, wt loss, night sweats, loss of appetite, or other constitutional symptoms  Overall good compliance with treatment, and good medicine tolerability. Past Medical History  Diagnosis Date  . 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 DIABETES MELLITUS, TYPE II 08/29/2006  . HYPERLIPIDEMIA 08/29/2006  . Acute gouty arthropathy 04/08/2008  . GOUT 04/22/2007  . Overweight 08/29/2006  . ERECTILE DYSFUNCTION 11/26/2006  . DEPRESSION 08/29/2006  . HYPERTENSION 08/29/2006  . CORONARY ARTERY DISEASE 08/29/2006  . 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 CONGESTIVE HEART FAILURE 08/29/2006  . DIASTOLIC HEART FAILURE, CHRONIC 06/09/2008  . ALLERGIC RHINITIS 11/26/2006  . BENIGN PROSTATIC  HYPERTROPHY 08/29/2006  . KNEE PAIN, BILATERAL 04/21/2008  . NECK PAIN, LEFT 01/03/2010  . LUMBAR RADICULOPATHY, RIGHT 06/10/2007  . LEG PAIN, BILATERAL 04/21/2008  . DIZZINESS 08/13/2009  . INSOMNIA-SLEEP DISORDER-UNSPEC 10/23/2007  . Other malaise and fatigue 04/22/2007  . RASH-NONVESICULAR 01/14/2007  . PERIPHERAL EDEMA 10/22/2008  . DYSPNEA 06/10/2008  . DYSPNEA ON EXERTION 08/13/2009  . PSA, INCREASED 03/09/2009  . ANKLE SPRAIN 02/18/2007  . LACERATION, FINGER 09/07/2008  . PACEMAKER, PERMANENT 03/04/2008   Past Surgical History  Procedure Date  . Coronary artery bypass graft 1992  . Permanent  pacemaker   . S/p 9 stents to coronaries per patient   . S/p left arm surgury after work accident 44  . S/p right hand surgury for foreign object     reports that he has quit smoking. He does not have any smokeless tobacco history on file. He reports that he does not drink alcohol or use illicit drugs. family history includes Coronary artery disease (age of onset:50) in his other; Diabetes in his mother, other, and sister; and Heart disease in his sister. Allergies  Allergen Reactions  . Rosuvastatin    No current outpatient prescriptions on file prior to visit.   Review of Systems All otherwise neg per pt     Objective:   Physical Exam BP 110/80  Pulse 67  Temp(Src) 98.3 F (36.8 C) (Oral)  Ht 5\' 11"  (1.803 m)  Wt 232 lb (105.235 kg)  BMI 32.36 kg/m2  SpO2 97% Physical Exam  VS noted Constitutional: Pt appears well-developed and well-nourished.  HENT: Head: Normocephalic.  Right Ear: External ear normal.  Left Ear: External ear normal.  Eyes: Conjunctivae and EOM are normal. Pupils are equal, round, and reactive to light.  Neck: Normal range of motion. Neck supple.  Cardiovascular: Normal rate and regular rhythm.   Pulmonary/Chest: Effort normal and breath sounds normal.  Abd:  Soft, NT, non-distended, + BS Neurological: Pt is alert. No cranial nerve deficit. motor intact except  for 4+/5 distal LLE weakness, sens intact, DTR's intact, slr neg bilat Skin: Skin is warm. No erythema.  Psychiatric: Pt behavior is normal. Thought content normal.  1+ nervous       Assessment & Plan:

## 2010-06-14 NOTE — Discharge Summary (Signed)
[name], DALZIEL              ACCOUNT NO.:  [id number]   MEDICAL RECORD NO.:  [id number]          PATIENT TYPE:  INP   LOCATION:  2919                         FACILITY:  [facility]   PHYSICIAN:  Everardo Beals. Juanda Chance, MD, FACCDATE OF BIRTH:  1945/04/13   DATE OF ADMISSION:  06/14/2008  DATE OF DISCHARGE:                               DISCHARGE SUMMARY   PRIMARY FINAL DISCHARGE DIAGNOSIS:  Chest pain, medical therapy for  coronary artery disease and outpatient Myoview planned.   SECONDARY DIAGNOSES:  1. Chronic diastolic congestive heart failure.  2. History of symptomatic secondary heart block, status post Medtronic      dual-chamber pacemaker.  3. Status post aortocoronary coronary bypass surgery in 2002.  4. Status post Taxus stent to circumflex in 2006.  5. Bare-metal stent near the anastomosis of the left internal mammary      artery graft to left anterior descending in 2002.  6. Cypher stent to the RCA in 2003.  7. Hypertension.  8. Hyperlipidemia.  9. Diabetes.  10.Obesity.  11.Depression.  12.Benign prostatic hypertrophy.  13.Family history of coronary artery disease in two sisters.  14.Allergy or intolerance to CRESTOR.   TIME OF DISCHARGE:  36 minutes.   HOSPITAL COURSE:  [name] is a 65 year old male with known coronary  artery disease.  He had increasing dyspnea on exertion and then  developed chest pain.  He came to the hospital where initially code  STEMI was called but upon further review, the EKG was felt to reflect  left bundle-branch block secondary V pacing, so he was admitted for  further evaluation and treatment.   His BNP was less than 30.  Lipid profile showed total cholesterol 132,  triglycerides 66, HDL 38, and LDL 81.  TSH within normal limits at  0.683.  His CK-MBs were elevated with a peak of 391/13.9 and an index of  23.6 and 3.8.  However, troponins were all negative.  Because of the  elevation in his cardiac enzymes in the setting of a GFR  greater than  60, it was felt that cardiac catheterization was indicated and he was  taken to the cath lab on Jun 15, 2008.  He had a right and left heart  cath.  The LAD was totaled.  The circumflex was 70% stenosed and the RCA  had a 70% mid in-stent restenosis.  The LIMA to LAD was patent with less  than 10% restenosis at stents distal to the anastomosis.  The SVG to  circumflex was totaled which was old.  His EF was 60%.  Pulmonary artery  pressures were 27/4 with a mean of 17 and pulmonary artery wedge  pressure of 5.  Dr. Juanda Chance evaluated the films and felt there was no  definite source of ischemia.  He felt that the RCA lesion was borderline  and therefore medical therapy was planned with an outpatient Myoview in  followup.   On Jun 16, 2008, [name] was ambulating without chest pain or  shortness of breath.  His pacer had been interrogated and showed no  changes made and good battery status.  His underlying rhythm was a 2:1  block with a ventricular rate of 35 beats per minute.  He was 90% V-  paced and 10% atrial pacing and ventricular pacing.  There were no high  rate episodes recorded.  Dr. Juanda Chance reviewed all the data and felt that  [name] could be safely discharged home with close outpatient  followup and Myoview.   DISCHARGE INSTRUCTIONS:  1. His activity level is to be increased gradually.  2. He is not to lift for a week and no driving for 2 days.  3. He is to stick to a low-sodium diabetic diet.  4. He is to call our office for problems with cath site.  5. He is to get a stress test on Jun 24, 2008, and an echocardiogram      on Jun 24, 2008, at 10:30.  6. He is to follow up with Dr. Juanda Chance on June 30, 2008, at 2:15 and      with Dr. Jonny Ruiz as needed.   DISCHARGE MEDICATIONS:  1. Multivitamin daily.  2. Metformin 500 mg is on hold till Jun 18, 2008.  3  Ranexa 500 mg b.i.d.  1. Furosemide 40 mg 2 tablets daily.  2. Imdur 60 mg daily.  3. Amaryl 4 mg 2  tablets daily.  4. Coreg 6.25 b.i.d.  5. Enalapril 10 mg b.i.d.  6. Plavix 75 mg daily.  7. Potassium 20 mEq daily.  8. Nasal spray as needed.  9. Gabapentin 300 mg as prior to admission.  10.Allopurinol 300 mg daily.  11.Hydrocodone 10 mg as prior to admission.  12.Aspirin 81 mg daily.  13.Study drug as prior to admission.      Theodore Demark, PA-C      Bruce R. Juanda Chance, MD, [facility]  Electronically Signed    RB/MEDQ  D:  06/16/2008  T:  06/17/2008  Job:  [id number]   cc:   Corwin Levins, MD

## 2010-06-14 NOTE — Consult Note (Signed)
[name], [name]              ACCOUNT NO.:  [id number]   MEDICAL RECORD NO.:  [id number]          PATIENT TYPE:  INP   LOCATION:  4729                         FACILITY:  [facility]   PHYSICIAN:  Levert Feinstein, MD          DATE OF BIRTH:  [birth year]   DATE OF CONSULTATION:  DATE OF DISCHARGE:  10/25/2007                                 CONSULTATION   CHIEF COMPLAINT:  Sweaty, generalized weakness.   HISTORY OF PRESENT ILLNESS:  The patient is a 65 year old gentleman with  past medical history of hypertension, type 2 diabetes, hyperlipidemia,  coronary artery disease, status post CABG, pacemaker in place about 8  years ago for sick sinus syndrome.   He is presenting with multiple episodes of sweaty, generalized weakness,  what has prompted to his hospital admission in October 23, 2007, with  a prolonged episode.  He reported he came back home from grocery  shopping, sit on his chair, bending down to take off his shoes.  Afterwards, he suddenly had this sweaty, cold sensation, generalized  weakness, but he denied chest pain, tachycardia, vision shrinking, or  hearing change.  There was no lateralized motor or sensory deficit.  He  was able to call his wife, who has offered him her walker, and when he  trying to get up, holding on the walker, he felt generalized weakness,  landing face down on the floor, later was taken to the hospital.  He  denies loss of conciousness,  he also self-checked his glucose, it was  97.  He denied lateralized motor or sensory deficit  Per EMS record, the  glucose was 107, the blood pressure was 130/78, and heart rate was 89.   Currently, he denied lateralized motor or sensory deficit, described  similar episode multiple times within past 1 month, majority of the  time, when he sits still, such as watching TV.   Since admission, he already had cardiac cath, which has demonstrated  moderate atherosclerotic disease and deemed to be a medical management  candidate   CT of the brain without contrast revivwed, there was no acute lesion.   On echocardiogram most recent being in February 2009, there was normal  left ventricular function of 55%, mild-to-moderate aortic root  dilatation.  Lab evaluation has demonstrated normal CMP with the  exception of mildly elevated glucose of 101, normal CBC, troponin was  negative, A1c 6.1, TSH was normal.   The patient reported that prior to his pacemaker placement for sick  sinus syndrome, he also felt chest pain, pounding headaches, it seems to  be different from the symptoms that he experienced now.   CURRENT MEDICATIONS:  Aspirin, Plavix, Coreg, enalapril, Proscar, Lasix,  Amaryl, NovoLog, Claritin, Zocor, Tylenol, Xanax, Vicodin, morphine,   PHYSICAL EXAMINATION:  VITAL SIGNS:  Temperature is 97.6, heart rate of  62-68, and blood pressure is 96-132/60-79.  CARDIAC:  Regular rate and rhythm.  PULMONARY:  Clear to auscultation bilaterally.  NECK:  Supple.  No carotid bruits.  NEUROLOGIC:  He is alert and oriented to history taking, care of  conversation.  Cranial Nerves II-XII:  Pupil are equal, round, and  reactive to light; extraocular movements were full; left fundi was  sharp; visual fields were full on confrontational test; facial sensation  and strength was normal; uvula and tongue midline; head turning and  shoulder shrugging were normal and symmetric; tongue protrusion into  cheek strength was normal.  Motor Examination:  Normal tone, bulk, and  strength.  Sensory Examination:  Normal to light touch, vibratory  sensation.  Deep Tendon Reflex:  Hypoactive, symmetric.  Plantar  responses were flexor.  Coordination:  Normal finger-to-nose, heel-to-  shin.  Gait:  Narrow-based and steady.   ASSESSMENT/PLAN:  This is a 65 year old with history of hypertension,  diabetes, hyperlipidemia, coronary artery disease, status post  pacemaker, presenting with syncope/presyncope episode.   The  semiology is less suggestive of central nervous system etiology,  such as stroke, stroke, he will benefit cardiac monitoring and workup.  From neurology standpoint, we will hold off further evaluation, continue  to observe his symptoms now.      Levert Feinstein, MD  Electronically Signed     YY/MEDQ  D:  10/25/2007  T:  10/26/2007  Job:  [id number]

## 2010-06-14 NOTE — Assessment & Plan Note (Signed)
[facility]                            CARDIOLOGY OFFICE NOTE   [name], [name]                     MRN:          [id number]  DATE:10/30/2007                            DOB:          1945-02-05    PRIMARY CARE PHYSICIAN:  Corwin Levins, MD   CLINICAL HISTORY:  [name] is a 65 year old and returned for a  followup visit after his recent hospitalization for presyncope.  He has  coronary heart disease with previous bypass surgery and has had multiple  percutaneous interventions.  He also has had diastolic heart failure and  has a DDD pacemaker.  He recently admitted with a syncopal episode.  He  was sitting on a bench at the end of the bed and felt somewhat clammy  and lightheaded.  When he got up, he fell to the floor.  His wife called  911.  By the time when EMS arrived and when he got to the hospital, his  blood pressures were in the normal range and his sugars were in the  normal range.  He underwent catheterization in the hospital by Dr.  Excell Seltzer, and was found to have 40% narrowing within the stent in the  right coronary and 70% narrowing within sent in the LAD supplied by the  LIMA graft.   Since he had been off of the hospital, he has had no recurrent spells.   PAST MEDICAL HISTORY:  Significant for the problems outlined below.   CURRENT MEDICATIONS:  1. Coreg 12.5 mg b.i.d., which is an increased dose.  2. Imdur 6 mg daily.  3. Finasteride.  4. Glimepiride.  5. Lasix 40 mg daily.  6. K-Dur 20 mEq daily.  7. Vasotec 10 mEq daily.  8. Simvastatin 80 mg daily.  9. Fexofenadine 180 mg daily.  10.Vitamins.  11.Aspirin.  12.Plavix.  13.Metformin.  14.Ranexa 500 mg 2 tablets daily.   PHYSICAL EXAMINATION:  VITAL SIGNS:  On examination, the blood pressure  is 99/58 and the pulse 71 and regular.  NECK:  There was no venous distension.  The carotid pulses were full  without bruits.  CHEST:  Clear.  CARDIAC:  Rhythm is regular.   No murmurs or gallops.  ABDOMEN:  Soft.  Normal bowel sounds.  No hepatosplenomegaly.  EXTREMITIES:  Peripheral pulses are full with no peripheral edema.   An electrocardiogram showed atrial tracking and ventricular pacing.  On  interrogation of his pacemaker, a good thresholds on both the atrial and  ventricular leads.  There were no mode switches.  His atrial pacing is  54% at that time and ventricular pacing 97% at that time.   IMPRESSION:  1. Recent presyncopal episode of uncertain etiology.  2. Coronary artery disease status post coronary bypass graft surgery      in 1992 and status post multiple percutaneous intervention with 40%      in-stent restenosis in the native right coronary and 70% in-stent      restenosis within the stent in the LAD supplied by the mammary      graft.  3. Good left ventricular function.  4. Diastolic heart failure, now euvolemic.  5. History of status post dual-mode, dual-pacing, dual-sensing      pacemaker implantation.  6. Hypertension treated.  7. Hyperlipidemia.   RECOMMENDATIONS:  I think, [name] is doing fairly well.  I am not  really certain about the etiology of syncopal spell.  We will plan to  follow up him and if has recurrence we will decide about  further  evaluation.  His blood pressure is little bit on the lower side and will  cut back his Lotensin from 10 to 5 a day and leave his Coreg the same.  We will plan to see him back in followup in 4 months.     Bruce Elvera Lennox Juanda Chance, MD, [facility]  Electronically Signed    BRB/MedQ  DD: 10/30/2007  DT: 10/31/2007  Job #: [id number]

## 2010-06-14 NOTE — H&P (Signed)
NAMETAINO, MAERTENS NO.:  [id number]   MEDICAL RECORD NO.:  [id number]          PATIENT TYPE:  INP   LOCATION:  1825                         FACILITY:  [facility]   PHYSICIAN:  Marca Ancona, MD      DATE OF BIRTH:  12/14/1945   DATE OF ADMISSION:  06/14/2008  DATE OF DISCHARGE:                              HISTORY & PHYSICAL   PRIMARY CARE PHYSICIAN:  Corwin Levins, MD.   PRIMARY CARDIOLOGIST:  Everardo Beals. Juanda Chance, MD, [facility]   HISTORY OF PRESENT ILLNESS:  This is a 65 year old with a history of  coronary artery disease status post coronary artery bypass grafting,  diastolic congestive heart failure, and heart block, status post  pacemaker placement who presented with chest pain.  Recently, the  patient had increased dyspnea on exertion to the point where he was  short of breath, walking around his house.  He saw Dr. Juanda Chance on May 12,  and Lasix was increased.  Since then, he has been less short of breath.  He is walking on flat ground without dyspnea on exertion.  He does still  have orthopnea.  He sleeps on 2 pillows.  Today, the patient was at  church, he did develop severe squeezing substernal chest pain while in  church that lasted for approximately 3 minutes.  The pain did ease but  did not completely resolve.  He did call EMS who came and brought him to  the emergency department.  On the way, he had nitroglycerin sublingual,  nitroglycerin spray, and morphine.  The chest pain has now almost  completely resolved, it is now 1/10.  He is on a nitroglycerin drip.  The patient was initially called a code STEMI from the field because of  a left bundle-branch block, however, upon evaluation in the emergency  department this was found to be secondary to ventricular pacing.  The  patient was still having chest pain at that time, the cath lab that  already come in, so it was offered to the patient that we go ahead and  take him to the cath lab.  Given his continuing pain,  however, he quite  strongly felt that he wanted to wait for Dr. Juanda Chance, so he was not taken  to the lab.  With increasing nitroglycerin, we did bring his pain under  control and as mentioned it has almost completely resolved at this  point.  Prior to this event, the patient had not had chest pain in  number of months, he says.   PAST MEDICAL HISTORY:  1. [employer]onic dual-chamber pacemaker for second-degree heart block.  2. Coronary artery disease, status post coronary artery bypass      grafting in 2002 and multiple PCI since then.  He did have a bare-      metal stent placed at the anastomosis of the LIMA to the LAD in      2002.  He does have a mid LAD stent as well.  He has a CYPHER drug-      eluting stent in the proximal RCA that was placed in 2003 and  he      has a TAXUS drug-eluting stent in the circumflexes that was placed      in 2006.  His last heart catheterization was done back in September      2009 because of chest pressure and presyncope.  This showed the      ostial LAD to be totally occluded.  There was 50% mid circumflex      stenosis and 40-50% mid RCA in-stent restenosis.  The LIMA to the      LAD was patent.  In the mid LAD, there was 70% in-stent restenosis,      however, there was no intervention done at that time.  The patient      was planned for medical management.  3. Diastolic congestive heart failure, last echocardiogram was in      February 2009.  The EF was 55%.  There was mid to apical septal      hypokinesis and diastolic dysfunction.  4. Hypertension.  5. Hyperlipidemia.  6. Diabetes.  7. Obesity.  8. Depression.  9. Benign prostatic hypertrophy.   SOCIAL HISTORY:  The patient lives in [place] with his wife.  He is  retired from [employer].  He has not smoked for about 40  years, did not drink alcohol or use any drugs.   FAMILY HISTORY:  Significant for pneumonia in the patient's mother and 2  sisters with coronary disease.   REVIEW  OF SYSTEMS:  All other systems were reviewed and were negative  except as noted in the history of present illness.   MEDICATIONS:  1. Imdur 60 mg daily.  2. Lasix 80 mg daily.  3. Ranolazine 500 mg b.i.d.  4. Enalapril 10 mg b.i.d.  5. Glimepiride 8 mg daily.  6. Coreg 6.25 mg daily.  7. Potassium chloride 20 mEq daily.  8. Plavix 75 mg daily.  9. Aspirin 81 mg daily.  10.Allopurinol 300 mg daily.   Chest x-ray has not yet been done.  EKG shows A sensed and V-paced  rhythm.   LABORATORY DATA:  White count 5.8, hemoglobin 12.9, and platelets 166.  Potassium 4.6, creatinine 0.87.  First set of cardiac enzymes were  negative.  BNP is less than 30.  INR is 1.0.   PHYSICAL EXAMINATION:  VITAL SIGNS:  The patient is afebrile.  Heart  rates is in 70s and paced, blood pressure is 111/68, O2 saturation is  97% on 2 L of nasal cannula.  GENERAL:  This is an obese male in no apparent distress.  NEUROLOGIC:  Alert and oriented x3.  Normal affect.  HEENT:  Normal exam.  ABDOMEN:  Soft, nontender.  No hepatosplenomegaly.  There was mild  abdominal distention.  NECK:  Supple without lymphadenopathy.  There is no thyromegaly or  thyroid nodule.  JVP is slightly elevated at 8 cm of water.  CARDIOVASCULAR:  Heart regular S1 and S2.  There is a soft S4.  There is  no S3.  There is no murmur.  There are 2+ posterior tibial and dorsalis  pedis pulses bilaterally.  There is no carotid bruit.  There is no  peripheral edema.  EXTREMITIES:  No clubbing or cyanosis.  MUSCULOSKELETAL:  Normal exam.  LUNGS:  Clear to auscultation bilaterally.  SKIN:  Normal exam.   ASSESSMENT AND PLAN:  This is a 65 year old with history of coronary  artery disease, status post coronary artery bypass grafting, diastolic  congestive heart failure, and pacemaker who presented with chest pain.  1. Chest pain. The patient's chest pain is concerning for acute      coronary syndrome.  It is now down to 1/10 pain level.   He is on a      nitroglycerin drip at 20 mcg/minute.  His first set of cardiac      enzymes is negative.  His rhythm is paced.  Our plan will be to      cycle cardiac enzymes and start him on a heparin drip.  Continue      him on aspirin and Plavix, as well as his beta blocker.  We will      titrate the nitroglycerin drip as needed to keep him pain free.  He      is on a study drug for cholesterol, which he should continue.  We      will interrogate his pacemaker and see what the underlying rhythm      is.  We will assess for ST elevation if he does not have an      underlying chronic left bundle-branch block.  Finally, we will plan      for a left heart catheterization in the morning if the patient      remains stable.  2. Diastolic congestive heart failure.  This is stable.  BNP is      decreased from the prior check.  He recently has Lasix increased.      He is only mildly volume overloaded on exam.  We will continue him      on his current Lasix dose.      Marca Ancona, MD  Electronically Signed     DM/MEDQ  D:  06/14/2008  T:  06/15/2008  Job:  [id number]

## 2010-06-14 NOTE — Assessment & Plan Note (Signed)
[facility]                            CARDIOLOGY OFFICE NOTE   [name], [name]                     MRN:          [id number]  DATE:12/11/2006                            DOB:          Nov 20, 1945    PRIMARY CARE PHYSICIAN:  Dr. Efrain Sella.   CLINICAL HISTORY:  [name] is 65 years old, and has had prior bypass  surgery and multiple percutaneous interventions, the last of which was a  stent to the LAD through a LIMA graft in February 2008.  He also has had  diastolic heart failure.  He also has a DDD pacemaker for A-V block.   He has been doing reasonably well, and has not had much chest pain, but  he says he is more short of breath than usual.  He walks his [place]  around in a circle, and gets short of breath more easily than he did  before.   PAST MEDICAL HISTORY:  Significant for other problems outlined below.   CURRENT MEDICATIONS:  Include aspirin, Plavix, enalapril, Lasix, K-Dur,  multivitamin, Imdur, Coreg, Ranexa, metformin, and Improve It Study  Drug.   EXAMINATION:  Blood pressure is 130/78.  Pulse 84 and regular.  There was no venous distention.  The carotid pulses were full without  bruits.  The neck veins elevated 1 cm above the clavicle.  CHEST:  Was clear.  CARDIAC:  Rhythm was regular.  I could hear no murmurs or gallops.  ABDOMEN:  Protuberant.  Soft with normal bowel sounds.  There was no  hepatosplenomegaly.  There was trace to 1+ peripheral edema.   IMPRESSION:  1. Coronary artery disease status post prior coronary artery bypass      graft surgery in 1992 and multiple percutaneous interventions as      described above.  The last was a bare-metal stent to the left      anterior descending through at LIMA graft February 2008.  2. Good left ventricular function.  3. Diastolic heart failure.  4. Status post DDD pacemaker for A-V block.  5. Hypertension.  6. Hyperlipidemia.  7. Improve It Study Drug.   RECOMMENDATIONS:  [name] has increased shortness of breath and  evidence of mild volume overload.  We will plan to increase his Lasix  from 40 b.i.d. to 80 in the morning, and 40 in the afternoon.  We will  get BMP, BNP, and CBC today, and in 1 week we will get a repeat  BMP.  I will see him back in followup in 4 months.  We will also get an  echocardiogram before that visit.     Bruce Elvera Lennox Juanda Chance, MD, [facility]  Electronically Signed    BRB/MedQ  DD: 12/11/2006  DT: 12/11/2006  Job #: [id number]

## 2010-06-14 NOTE — Assessment & Plan Note (Signed)
[facility]                            CARDIOLOGY OFFICE NOTE   [name]                     MRN:          [id number]  DATE:07/31/2007                            DOB:          06/14/1945    PRIMARY CARE PHYSICIAN:  Randall Levins, MD   CLINICAL HISTORY:  [name] is 65 years old who returns for  followup management of his coronary heart disease, diastolic heart  failure, and pacemaker.  He has had previous bypass surgery and multiple  percutaneous interventions including unsuccessful PCI of the circumflex  vein graft.  However, he has done quite well recently.  He has had no  recent chest pain, and he feels like his fluids are under good control.  He has shortness of breath with exertion, but this has not changed.   He had a fall about a week ago and broke a bone in his foot and is  currently on crutches or walker for this.   PAST MEDICAL HISTORY:  Significant for problems outlined below.   CURRENT MEDICATIONS:  Aspirin, enalapril, isosorbide, carvedilol, K-Dur,  Plavix, furosemide, glimepiride, Ranexa, metformin, dicyclomine,  IMPROVE-IT study drug, and colchicine.   PHYSICAL EXAMINATION:  VITAL SIGNS:  The blood pressure is 146/83 and  the pulse is 76 and regular.  NECK:  The venous pulsations were questionably visible 1 cm above the  clavicle.  The carotid pulses were full without bruits.  CHEST:  Clear.  HEART:  Rhythm is regular.  No murmurs or gallops.  ABDOMEN:  Soft with normal bowel sounds.  EXTREMITIES:  There was trace peripheral edema.  The right foot was in a  boot.   IMPRESSION:  1. Coronary artery disease status post coronary artery bypass graft      surgery in 1992 and status post multiple percutaneous interventions      as described above.  2. Good left ventricular function with an ejection fraction of 55%.  3. Diastolic heart failure class II, now probably euvolemic.  4. Status post dual-mode,  dual-pacing, dual-sensing pacemaker for      arteriovenous block.  5. Hypertension, treated.  6. Hyperlipidemia.  7. IMPROVE-IT study.  8. Recent fracture of the foot bone.   RECOMMENDATIONS:  I think [name] is doing well from a cardiac  standpoint.  We will get a BMP and CBC on him today.  If his blood  pressure is up a little bit, then we will increase his Coreg from 3.125  mg daily to 6.25 mg b.i.d.  I will see him back in followup in 4 months.     Bruce Elvera Lennox Juanda Chance, MD, [facility]  Electronically Signed    BRB/MedQ  DD: 07/31/2007  DT: 08/01/2007  Job #: [id number]

## 2010-06-14 NOTE — Assessment & Plan Note (Signed)
[facility]                            CARDIOLOGY OFFICE NOTE   AZARION, HOVE                     MRN:          [id number]  DATE:04/04/2007                            DOB:          [birth year]    PRIMARY CARE PHYSICIAN:  Corwin Levins, M.D.   CLINICAL HISTORY:  Mr. Kinsella treatment is 65 year old who returns for  follow-up management of coronary heart disease and diastolic heart  failure.  He has had previous bypass surgery, has had multiple  percutaneous coronary interventions including an unsuccessful PCI of the  circumflex graft.  The last intervention was stent to the LAD through a  LIMA graft in February 2008.  He also has a DDD pacemaker and he has  history of diastolic dysfunction.  He has been doing fairly well.  Does  have occasional chest pain but it does not sound exertional or anginal.  He has shortness breath with exertion.  This has not changed.   Past medical history is significant for problems outlined below.   CURRENT MEDICATIONS:  1. Aspirin.  2. Enalapril 10 mg b.i.d.  3. Isosorbide 30 mg daily.  4. Carvedilol 3.125 mg daily.  5. K-Dur 40 mEq daily.  6. Lasix 40 mg daily.  7. Plavix.  8. Glimepiride.  9. Ranexa 500 mg b.i.d.  10.Metformin.  11.Dicyclomine.  12.IMPROVE-IT study drug.   PHYSICAL EXAMINATION:  VITAL SIGNS:  On examination today the blood  pressure is 127/80 and pulse 69 and regular.  NECK:  There is no venous tension.  The carotid pulses were full without  bruits.  CHEST:  Was clear.  CARDIAC:  Rhythm was regular.  No murmurs or gallops.  ABDOMEN:  The abdomen was soft without organomegaly.  EXTREMITIES:  Peripheral pulses full.  There was trace to 1+ peripheral  edema.   Electrocardiogram showed atrial tracking and ventricular pacing.   IMPRESSION:  1. Coronary artery disease, status post coronary bypass graft in 1992      and multiple percutaneous interventions as described above.  2. Good  left ventricular function by recent echo with ejection      fraction of 55%.  3. Diastolic heart failure, now compensated, Class II.  4. Status post DDD pacemaker for atrioventricular block.  5. Hypertension.  6. Hyperlipidemia.  7. IMPROVE-IT study.   RECOMMENDATIONS:  Mr. Althaus was doing well.  We will plan to continue  his current medications.  I encouraged him to lose weight and talked to  him about low carb diet.  Will plan to see him back in 4 months.  He  asked about medication help with weight loss and I referred him to Dr.  Jonny Ruiz, but Dr. Jonny Ruiz has already made recommendations for medicines but  Mr. Pate will not be able to afford it presently.  Everardo Beals Juanda Chance, MD, [facility]  Electronically Signed    BRB/MedQ  DD: 04/04/2007  DT: 04/04/2007  Job #: [id number]

## 2010-06-14 NOTE — H&P (Signed)
[name], [name] NO.:  [id number]   MEDICAL RECORD NO.:  [id number]          PATIENT TYPE:  INP   LOCATION:  4729                         FACILITY:  [facility]   PHYSICIAN:  Marca Ancona, MD      DATE OF BIRTH:  02-Jul-1945   DATE OF ADMISSION:  10/23/2007  DATE OF DISCHARGE:                              HISTORY & PHYSICAL   PRIMARY CARE PHYSICIAN:  Dr. Jonny Ruiz.   PRIMARY CARDIOLOGIST:  Everardo Beals. Juanda Chance, MD, [facility]   BRIEF HISTORY:  Mr. Teschner is a 65 year old white male who presented to  [facility] Emergency Room via EMS secondary to near syncope.  He saw his  primary care physician this morning for a routine followup appointment  and a flu shot and also informed him of the following.  However, he was  informed to keep his appointment with Dr. Juanda Chance.   Mr. Tubbs describes at least a 79-month history of sudden sweating  episodes.  He states that he will become weak all over and feels like  he is going to fall over.  He denies actual syncope or falls.  This has  occurred 2-3 times a week, usually in the morning, however, could occur  any time and the feelings of generalized weakness could last all day.  Although, he does walk 3 miles a day, it does not occur with walking.  He notes that the weakness is particularly worse in his legs and his  chronic shortness of breath is worse.  He specifically denies any  associated chest discomfort, dizziness, visual disturbances, spinning  sensation, nausea, vomiting, or palpitations.  He would usually rest and  lay down and the symptoms would resolve, sometimes could last all day.   However, today post his primary care physician's appointment, he  developed the same symptoms while sitting down at approximately 12:10.  He states that they were the worst yet.  He stood up and when he tried  to take a step, he fell flat on his face.  He also noted anterior  chest discomfort, like someone stepping on his chest and some  numbness  in his right leg which were new symptoms for him.  His wife called 911.  He feels that these symptoms are similar to his myocardial infarction,  except he has never the right leg numbness before.  He also recalls that  he has tried taking nitroglycerin at home and they have not helped the  symptoms, although they have given a headache.  In the emergency room,  he did receive a nitroglycerin which relieved his chest discomfort.  EMS  report from [facility] showed a blood sugar of 107 on arrival,  blood pressure 124/74, and a heart rate of 89.  The patient did check  his sugars in the past for these episodes and they have ranged between  80 and 130.   ALLERGIES:  No known drug allergies.   MEDICATIONS:  Prior to admission include a partial list as the patient  did not bring his bottles or complete list with him.  These include and  improve with study  drug.  1. Coreg 12.5 mg b.i.d.  2. Imdur 60 mg daily.  3. Finasteride 5 mg daily.  4. Glimepiride 4 mg daily.  5. Lasix 40 mg daily.  6. Potassium 20 mEq daily.  7. Vasotec 10 mg b.i.d.  8. Simvastatin 80 mg at bedtime.  9. Fexofenadine 180 mg daily.  10.Alprazolam 0.25 mg p.r.n.  11.Hydrocodone/APAP 75/750 four times a day p.r.n.  12.Multivitamin daily.  13.Aspirin 325 daily.  14.Nitroglycerin 0.4 p.r.n.  15.Plavix 75 daily.  16.Possibly metformin, unknown dose b.i.d.   PAST MEDICAL HISTORY:  1. Notable for hyperlipidemia, last check in October 2008 showed total      cholesterol 128, triglycerides 81, HDL 44, LDL 60.  2. Hypertension.  3. He has had a history of sick sinus syndrome, AV block with a      Medtronic Kappa pacer insertion.  4. Type 2 diabetes.  Last hemoglobin A1c in October 2008 was 7.1.  5. He has a history of BPH with prostate cancer.  Last PSA in March      2008 was 2.11.  6. History of thrombocytopenia.  7. Obesity.  8. Coronary artery disease with diastolic CHF.  9. He has had 2-vessel  bypass surgery in 1992 with a LIMA to the LAD,      saphenous vein graft to the OM.  He has had multiple      catheterizations and multiple interventions since that time.  His      last intervention in February 2008 was associated with a non-ST      elevated myocardial infarction and he received a bare-metal stent      to the native mid LAD.  Last echo was on March 20, 2007 showing      an EF of 55%.  Last Myoview on March 30, 2006.   SOCIAL HISTORY:  He resides in [place] with his wife.  He is retired  from [employer].  He has not smoked in approximately 40 years.  Denies alcohol or drugs.  He does walk 3 miles a day without difficulty.   FAMILY HISTORY:  His mother died at the age of 46 with pneumonia.  His  father at the age of 85 post coma.  His 3 sisters deceased, 2 with  problems related to coronary artery disease and another with cancer.   REVIEW OF SYSTEMS:  Notable for a 18-pound weight loss over the  preceding 4 months secondary to diet, glasses, dentures, chronic  shortness of breath, dyspnea on exertion, orthopnea, PND, occasional  right pedal edema which decreases at night, positive snoring but prior  evaluation for sleep apnea was unremarkable, nocturia, generalized  weakness, right lower extremity numbness, and back arthralgias.  All  other systems are unremarkable.   PHYSICAL EXAMINATION:  GENERAL:  Well-nourished, well-developed,  pleasant, obese white male in no apparent distress.  Wife is present.  VITAL SIGNS:  Temperature is 97.4, blood pressure 116/79, pulse 65,  respirations 18, and 99% sat on 3 L.  HEENT:  Grossly unremarkable.  NECK:  Supple without thyromegaly, adenopathy, JVD, or carotid bruits.  CHEST:  Symmetrical excursion.  LUNGS:  Sounds were diminished, but clear to auscultation.  HEART:  PMI is not displaced.  Regular rate and rhythm.  Do not  appreciate any murmurs, rubs, clicks or gallops.  All pulses are  symmetrical and intact  without abdominal or femoral bruits.  EXTREMITIES:  Negative cyanosis, clubbing, or edema.  SKIN:  Integument was intact.  ABDOMEN:  Obese.  Bowel sounds present without organomegaly, masses, or  tenderness.  MUSCULOSKELETAL:  Grossly unremarkable.  NEUROLOGIC:  Grossly unremarkable.  He does not have any focal neuro  findings.   LABORATORY DATA:  EKG shows sinus rhythm with ventricular pacing and  first-degree AV block.  H&H is 14.0, 42.0, normal indices, platelets  156, and WBCs 7.3.  Point-of-care marker was negative x1.  BNP 25.   IMPRESSION:  1. Near syncope of uncertain etiology.  2. Chest discomfort associated with today's episode of uncertain      significance, however, he states that the above-mentioned symptoms      are similar to his prior interventions.  3. History as noted per past medical history listed above.   DISPOSITION:  Dr. Shirlee Latch reviewed the patient's history, spoke with and  examined the patient, and agrees with the above.  We will admit him to  [facility] for further evaluation, cycle enzymes, obtain chest x-ray,  and a CMET and our usual labs for further evaluation.  I have placed him  on the cath board for further evaluation tomorrow.  We will continue his  home medications.  I have asked his wife to bring in or call in his  complete medication list.      Joellyn Rued, PA-C      Marca Ancona, MD  Electronically Signed    EW/MEDQ  D:  10/23/2007  T:  10/24/2007  Job:  [id number]   cc:   Dr. Kandice Hams R. Juanda Chance, MD, [facility]

## 2010-06-14 NOTE — Assessment & Plan Note (Signed)
[facility]                            CARDIOLOGY OFFICE NOTE   ORLAND, VISCONTI                     MRN:          [id number]  DATE:09/12/2006                            DOB:          1945-04-07    PRIMARY CARE PHYSICIAN:  Corwin Levins, MD.   CLINICAL HISTORY:  Mr. Cubit is 65 years old and has had prior bypass  surgery and multiple percutaneous interventions.  His last intervention  occurred in February when he was admitted with a noninsulation  infarction and found to have a tight lesion in the mid LAD which was  treated with a bare metal stent.   He has done well since that time.  He does have some chest pain and  shortness of breath with exertion but these have been stable.  He  indicated that he had some episodes of tachycardia while he was in the  rehabilitation program.   PAST MEDICAL HISTORY:  Significant for problems outlined below.   CURRENT MEDICATIONS:  Aspirin, Plavix, Enalapril, Lasix, K-Dur, Imdur,  Zocor, study drugABC, Coreg, Ranexa and metformin.   PHYSICAL EXAMINATION:  VITAL SIGNS:  Blood pressure 110/74, pulse 82 and  regular.  NECK:  Venous pulsations were visible just at the clavicle.  Carotid  pulses were full without bruits.  CHEST:  Clear without rales or rhonchi.  CARDIOVASCULAR:  The cardiac rhythm was regular. Heart sounds were  normal.  I could hear no murmurs or gallops.  ABDOMEN:  Soft but protuberant.  There were normal bowel sounds.  No  hepatosplenomegaly.  VASCULAR:  The peripheral pulses were full and there was trace  peripheral edema.   An ECG showed atrial tracking and ventricular pacing.  We interrogated  his pacemaker, had good thresholds in both atrial and ventricular lead.  He is not pacer dependent.   IMPRESSION:  1. Coronary artery disease, status post remote coronary artery bypass      surgery  and multiple percutaneous interventions, the last of which      was a bare metal stent  through the left internal mammary artery      graft to the mid left anterior descending.  2. Good left ventricular function.  3. Status post DDD pacemaker for sick sinus syndrome and AV block.  4. Hypertension.  5. Hyperlipidemia.  6. History of diastolic heart failure.  7. Study drug ABC.   RECOMMENDATIONS:  I think Mr. Biedermann is doing well.  He appears to be  euvolemic.  We will not make any changes in his medication.  We will see  him back in three months.     Bruce Elvera Lennox Juanda Chance, MD, [facility] [place]  Electronically Signed    BRB/MedQ  DD: 09/12/2006  DT: 09/13/2006  Job #: [id number]

## 2010-06-14 NOTE — Cardiovascular Report (Signed)
NAMEQUAVIS, KLUTZ              ACCOUNT NO.:  [id number]   MEDICAL RECORD NO.:  [id number]          PATIENT TYPE:  INP   LOCATION:  2919                         FACILITY:  [facility]   PHYSICIAN:  Everardo Beals. Juanda Chance, MD, FACCDATE OF BIRTH:  December 03, 1945   DATE OF PROCEDURE:  DATE OF DISCHARGE:                            CARDIAC CATHETERIZATION   CLINICAL HISTORY:  Mr. 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  He also has a DDD pacemaker for AV block and he has  diastolic heart failure.  He has had previous stenting to the right  coronary artery and to the LAD through the LIMA graft.  He was admitted  to the hospital with chest pain.  His CK-MBs were elevated, but his  troponins were negative.   PROCEDURE:  The procedure was performed via the right femoral artery  using arterial sheath and 6-French preformed coronary catheters.  A  front wall arterial puncture was performed and Omnipaque contrast was  used.  Right heart catheterization was performed percutaneously via  right femoral vein using a venous sheath and Swan-Ganz thermodilution  catheter.  We used a LIMA catheter for injection of the LIMA graft.  The  patient tolerated the procedure well and left the laboratory in  satisfactory condition.   RESULTS:  The left main coronary artery:  The left main coronary artery  is free of significant disease.   Left anterior descending artery:  Left anterior descending artery is  completely occluded at its origin.   The circumflex artery:  The circumflex  artery gave rise to a marginal  branch and posterolateral branch.  There was 50-70% narrowing in the mid  circumflex just after the marginal branch.  The lesion was ostial in  relation to the marginal branch.   The right coronary artery:  The right coronary artery had a 70% stenosis  which was fairly focal within the midportion of the overlapping  stents  in the mid right coronary artery.  The rest of the artery was irregular,  but had no major obstruction.   The saphenous vein graft to the circumflex artery was completely  occluded at its origin.  This was an old occlusion.   The LIMA graft to LAD was patent and functioned well.  The 2 stents in  the mid and distal LAD were patent.  The LAD was small in caliber and  irregular, but there was no significant obstruction.   The left ventriculogram:  The left ventriculogram performed in the RAO  projection showed good wall motion with no areas of hypokinesis.  The  estimated ejection fraction was 60%.   HEMODYNAMIC DATA:  The right atrial pressure was 4 mean.  The pulmonary  artery pressure was 27/4 with a mean of 17.  Pulmonary wedge pressure  was 5 mean.  Left ventricular pressure was 114/8.  The aortic pressure  was 114/81 with a mean of 98.  Cardiac output/cardiac index was 5.0/2.2  L/min/m2.   CONCLUSION:  1. Coronary artery disease, status post  remote bypass surgery.  2. Severe native vessel disease with total occlusion of the left      anterior descending artery, 50-70% stenosis in the mid circumflex      artery, and 70% stenosis within the stent in the mid right      coronary.  3. Occluded vein graft to the circumflex artery (old) and patent LIMA      graft to the LAD with patent stents in the mid and distal LAD.  4. Normal LV function with an estimated ejection fraction of 60%.  5. Normal left ventricular filling pressures.   RECOMMENDATIONS:  There is no clear source of ischemia.  The lesion  within the stent in the right coronary artery may be slightly worse than  before and possibly could be flow limiting, although I think it probably  is not.  His troponins were negative, so I think that is unlikely that  any of these lesions account for his admission symptoms.  We will try to  get a D-dimer to screen for pulmonary embolism and will plan on  outpatient Myoview  scan.      Bruce Elvera Lennox Juanda Chance, MD, [facility]  Electronically Signed     BRB/MEDQ  D:  06/15/2008  T:  06/16/2008  Job:  [id number]   cc:   Corwin Levins, MD  Cardiopulmonary Lab

## 2010-06-14 NOTE — Cardiovascular Report (Signed)
NAMEBRASON, BERTHELOT              ACCOUNT NO.:  [id number]   MEDICAL RECORD NO.:  [id number]          PATIENT TYPE:  INP   LOCATION:  4729                         FACILITY:  [facility]   PHYSICIAN:  Veverly Fells. Excell Seltzer, MD  DATE OF BIRTH:  1945/02/09   DATE OF PROCEDURE:  10/24/2007  DATE OF DISCHARGE:                            CARDIAC CATHETERIZATION   PROCEDURE:  Left heart catheterization, selective coronary angiography,  left ventricular angiography, and left internal mammary artery  angiography.   INDICATIONS:  Mr. Weathers is a 65 year old gentleman who has undergone  prior CABG as well as multiple PCI procedures.  He has stents in his  right coronary artery, left circumflex, and LAD.  He has a patent LIMA  to LAD.  He presented yesterday with lightheadedness and near syncope.  During this episode, he felt chest pressure.  Other than that episode,  he has had no angina recently.  He walks 3 miles daily.  In the setting  of his new chest pain and extensive CAD, he was referred for cardiac  catheterization.   Risks and indications of procedure were reviewed with the patient.  Informed consent was obtained.  The right groin was prepped, draped, and  anesthetized with 1% lidocaine.  Using modified Seldinger technique, a 5-  French sheath was placed in the right femoral artery.  Standard 5-French  Judkins catheters were used for coronary angiography and left  ventriculography.  A 5-French LIMA catheter was used for LIMA  angiography.  All catheter exchanges were performed over a guidewire.  The patient tolerated the procedure well.  There were no immediate  complications.   FINDINGS:  Aortic pressure 116/71 with a mean of 89, left ventricular  pressure 114/19.   Coronary angiography, left mainstem.  The left mainstem is widely  patent.  There is mild diffuse plaque with 30% stenosis in the mid left  main.  There is no high-grade disease present.   LAD:  The LAD is occluded at its  origin.  The mid and distal LAD fill  from the internal mammary artery.   Intermediate branch:  The ramus branch is moderate-sized vessel that  bifurcates into twin vessels.  It is widely patent throughout.   Left circumflex:  The left circumflex is a relatively small distribution  vessel.  There are 2 small OM branches that arise from the circ.  The  mid circumflex has a widely patent stent with no significant restenosis.  The ostium of the circumflex has 50% stenosis beyond the stent is a 1-mm  vessel.  There are no high-grade lesions in the circumflex.   Right coronary artery.  Right coronary artery is patent.  There is a  stent in the midportion of the vessel with 40-50% in-stent restenosis.  Distally, the vessel supplies a PDA branch and 2 posterolateral  branches.  There is no significant stenosis in the branch vessels of the  right coronary artery.   Left internal mammary artery.  The LIMA is widely patent.  It is  anastomosed to the mid LAD and retrograde fills to 2 septal perforators  in a moderate-sized  diagonal branch.  Just beyond the anastomotic site,  there is patent stent.  The mid LAD also has a patent stent with  moderate in-stent restenosis of approximately 70%.  The entire vessel is  very small and appears to be less than 1 mm in diameter.  The mid and  distal LAD are diffusely diseased.   Left ventriculography:  There is hypokinesis of the mid anterior wall  and LV apex.  The remaining portions of the LV contract normally.  The  LVEF is estimated at 45-50%.   ASSESSMENT:  1. Three-vessel native coronary artery disease.  2. Patent left internal mammary artery to left anterior descending.  3. Patent native right coronary artery with mild in-stent restenosis.  4. Patent left circumflex.  5. Diffusely diseased mid and distal left anterior descending.  6. Mild left ventricular systolic dysfunction.   PLAN:  The patient has moderate coronary disease as described.   His LAD  is too small for repeat intervention.  He has nonobstructive disease  elsewhere.  We would recommend medical therapy for the patient's  coronary artery disease.      Veverly Fells. Excell Seltzer, MD  Electronically Signed     MDC/MEDQ  D:  10/24/2007  T:  10/25/2007  Job:  [id number]

## 2010-06-14 NOTE — Assessment & Plan Note (Signed)
[facility]                            CARDIOLOGY OFFICE NOTE   HAMZEH, TALL                     MRN:          [id number]  DATE:03/04/2008                            DOB:          [birth year]    PRIMARY CARE PHYSICIAN:  Corwin Levins, MD   CLINICAL HISTORY:  Mr. Rozelle is 65 years old and return for followup  management of his coronary heart disease.  He had remote bypass surgery  and has had percutaneous coronary interventions.  He also had diastolic  heart failure and has a DDD pacemaker for AV block.  He was last  hospitalized about 5 months ago for a syncopal episode and we were  uncertain regarding the etiology of this.   He has done well over the past 4 months.  He has not had any chest pain,  shortness of breath, or palpitations.   His past medical history is significant for the problems outlined below.   His current medications include:  1. Coreg 12.5 mg b.i.d.  2. Imdur 60 mg daily.  3. Finasteride.  4. Glimepiride  5. Lasix 40 mg daily.  6. K-Dur 20 mEq daily.  7. Vasotec 10 mg daily.  8. Simvastatin 80 mg nightly.  9. Fexofenadine 180 mg daily.  10.Aspirin.  11.Plavix.  12.Metformin.  13.Ranexa.   On examination, the blood pressure is 118/72 and the pulse 69 and  regular.  There was no venous distension.  The carotid pulses were full  without bruits.  Chest was clear.  Cardiac rhythm was regular.  He had  no murmurs or gallops.  The abdomen was soft without organomegaly.  There was trace peripheral edema.  Pedal pulses are equal.   EKG showed atrial tracking and ventricular pacing.   IMPRESSION:  1. Coronary artery disease status post prior coronary bypass graft      surgery in 1992 and multiple percutaneous interventions with stable      anatomy at last catheterization.  2. Good left ventricular function.  3. History of diastolic heart failure, now euvolemic.  4. Status post dual-mode, dual-pacing, dual-sensing  pacemaker for      atrioventricular block with good pacer function.  5. Hypertension.  6. Hyperlipidemia.   RECOMMENDATIONS:  I think Mr. Wanninger is doing fairly well.  We will get  a BMP today.  I will not make any change in his medications.  I will  plan to see him back in 6 months.  He is concerned about his bill and he  is in danger of losing his primary care because of this.  We had him  talk to our financial person today.     Bruce Elvera Lennox Juanda Chance, MD, [facility]  Electronically Signed    BRB/MedQ  DD: 03/04/2008  DT: 03/04/2008  Job #: [id number]

## 2010-06-14 NOTE — Discharge Summary (Signed)
[name], [name]              ACCOUNT NO.:  [id number]   MEDICAL RECORD NO.:  [id number]          Collier TYPE:  INP   LOCATION:  4729                         FACILITY:  [facility]   PHYSICIAN:  Jesse Sans. Wall, MD, FACCDATE OF BIRTH:  1945/07/03   DATE OF ADMISSION:  10/23/2007  DATE OF DISCHARGE:  10/25/2007                         DISCHARGE SUMMARY - REFERRING   DISCHARGE DIAGNOSES:  1. Weakness/near syncope of uncertain etiology.  2. Atypical chest discomfort.  3. Coronary artery disease without evidence of progression.  4. Hyperlipidemia.  5. History as previously.   PROCEDURES PERFORMED:  Cardiac catheterization on October 24, 2007 by  Dr. Excell Seltzer.   SUMMARY OF HISTORY:  [name] is a 65 year old white male who  presented to [facility] via EMS secondary to near syncope.  He  states over Randall preceding month, he has had sudden sweating episodes.  He would become weak all over and feel like he is going to fall flat on  his face.  He actually denies syncope or falls.  This has occurred 2-3  times a week, usually in Randall morning, however, could occur any time and  may be extended throughout Randall day.  It does not occur when he walks his  3 miles a day.  He also feels that his chronic shortness of breath is  worse.  On Randall morning of admission, post seeing his primary care  physician, he developed Randall similar symptoms.  However on Randall admission  episode, he actually did fall flat on his face.  He denied any  injuries and EMS was called.  EMS from [facility] showed a blood  sugar of 107 on arrival and a blood pressure 124/74.  He was admitted  for further evaluation.   PAST MEDICAL HISTORY:  1. Notable for hyperlipidemia.  2. Hypertension.  3. Sick sinus syndrome with a Medtronic Kappa pacer insertion.  4. Type 2 diabetes.  5. BPH with prostate cancer.  6. Thrombocytopenia.  7. Obesity.  8. Coronary artery disease with diastolic CHF and two-vessel bypass    surgery in 1992, and multiple interventions since that time.  9. Remote tobacco use.   LABORATORY:  Admission weight was 107.4.  Admission H&H was 14.0 and  42.0, normal indices, platelets 156, WBCs 7.3, PTT 30, PT 13.0, sodium  140, potassium 4.0, BUN 24, creatinine 1.08, glucose 96, normal LFTs.  Hemoglobin A1c 6.1.  CK-MBs and relative indexes were slightly elevated.  However, troponins were within normal limits.  Fasting lipids showed a  total cholesterol 91, triglycerides 94, HDL 25, LDL 47, TSH was 1.076.   DIAGNOSTICS:  1. Chest x-ray on October 23, 2007 showed cardiomegaly, low lung      volumes, bibasilar or subsegmental atelectasis.  No overt edema.  2. Head CT did not show any acute abnormality.  3. EKGs showed sinus rhythm, ventricular pacing.   HOSPITAL COURSE:  [name] was admitted to [facility].  His  pacemaker was interrogated.  On September 03, 2007, he did have 4  ventricular high rate episodes of approximately 5 seconds in duration.  However, device was functioning  normally and all parameters within  normal limits.  He underwent a cardiac catheterization given his enzymes  and presentation as that [name] thought that these symptoms were  similar to prior myocardial infarction.  Catheterization performed on  October 24, 2007 by Dr. Excell Seltzer showed native three-vessel coronary  artery disease, patent LIMA to Randall LAD, patent RCA and circumflex, mild  LV dysfunction with an EF of 50%.  Felt that it was stable compared to  prior catheterizations.  On  October 25, 2007, neurology consult was  obtained for further evaluation.  However, neurology did not feel that  his symptoms were neuro in etiology.  Orthostatic blood pressures were  also performed and did not show any acute changes.  After review of all  findings, Dr. Dietrich Pates, felt that [name] could be discharged home.   DISPOSITION:  [name] is discharged home on October 25, 2007.   DISCHARGE  INSTRUCTIONS:  1. Maintain a low-sodium heart-healthy ADA diet.  2. Wound care and activities are per supplemental discharge sheet.  3. He was informed that his home medications have not changed.  He was      asked to bring all medications to all appointments.  4. I have also asked [name] to check his sugar and blood pressure      with date and time documentation with any further episodes in hopes      that his physicians may be able to sort out his symptoms if they      should occur again.  He is agreeable to this plan.   DISCHARGE MEDICATIONS:  His home medications have not changed.  Randall  Collier did not bring a complete list to Randall hospital, but we believe  these are his home medications as listed:  1. Coreg 12.5 mg b.i.d.  2. Imdur 60 mg daily.  3. Finasteride 5 mg daily.  4. Glimepiride 4 mg daily.  5. Lasix 40 mg daily.  6. Potassium 20 mEq daily.  7. Vasotec 10 mg daily.  8. Simvastatin 80 mg q.h.s.  9. Fexofenadine 180 mg daily.  10.Alprazolam 0.25 mg as needed.  11.Hydrocodone/APAP 75/750 four times a day as needed.  12.Multivitamin daily.  13.Aspirin 325 mg daily.  14.Nitroglycerin 0.4 as needed.  15.Plavix 75 mg daily.  16.Metformin 500 mg 2 tablets daily.  17.Ranexa 500 mg 2 tablets daily.   Discharge time 45 minutes.      Joellyn Rued, PA-C      Jesse Sans. Daleen Squibb, MD, [facility]  Electronically Signed    EW/MEDQ  D:  10/25/2007  T:  10/25/2007  Job:  [id number]   cc:   Jonny Ruiz, MD  Everardo Beals Juanda Chance, MD, [facility]

## 2010-06-17 NOTE — Cardiovascular Report (Signed)
NAME:  [name], [name]                        ACCOUNT NO.:  [id number]   MEDICAL RECORD NO.:  [id number]                   PATIENT TYPE:  OIB   LOCATION:  2899                                 FACILITY:  [facility]   PHYSICIAN:  Charlies Constable, M.D. [facility]              DATE OF BIRTH:  02/14/1944   DATE OF PROCEDURE:  01/13/2002  DATE OF DISCHARGE:                              CARDIAC CATHETERIZATION   PROCEDURE PERFORMED:  Cardiac catheterization and percutaneous coronary  intervention.   CLINICAL HISTORY:  The patient is 65 years old and has documented coronary  disease.  He has had previous bypass surgery and had multiple percutaneous  interventions, the last of which was six months ago at which time he had a  Cypher stent placed in the proximal right coronary artery for in-stent re-  stenosis and had a Cutting Balloon angioplasty for the intermediate branch  of the circumflex artery for in-stent re-stenosis.  He had residual 90%  stenosis in the LAD after the insertion of the mammary graft which we  elected not to treat.  He had a followup scan at six months which showed  apical ischemia which was not previously present on his scan and we decided  to bring him in for reevaluation.   DESCRIPTION OF PROCEDURE:  The procedure was performed via the right femoral  artery using an arterial sheath and 6 French preformed coronary catheters.  A front wall arterial puncture was performed and Omnipaque contrast was  used. We used a LIMA catheter for injection of the LIMA graft.  We used a  JR4 catheter for injection of the vein graft.  After completion of the  diagnostic study we made a decision to proceed with intervention on the left  anterior descending artery through the LIMA graft.  The patient was given  Angiomax bolus in infusion and was given 300 mg of Plavix.  We used a 6  [place] LIMA guiding catheter with side holes and we used a Guidant whisper  wire.  We were able to pass the wire down  the mammary artery and across the  lesion without too much difficulty.  There was marked tortuosity with bends  in the mammary artery and for this reason we chose the whisper wire which is  not too stiff but is very slick and would traverse the loops.  We then pre-  dilated with a 2.25 x 15 mm CrossSail performing  one inflation up to 10  atmospheres for 30 seconds.  We then deployed a 2.25 x 13 mm Pixel stent.  Just before deployment, we had difficulty positioning the stent because  there was marked motion up and down the vessel of the stent with the heart  beat.  We tried to position this and timed this as best as possible and we  deployed the stent with one inflation of 10 atmospheres for 30 seconds and a  second inflation  of 14 atmospheres for 30 seconds with the balloon pulled  inside the distal edge.  We felt satisfied with the position of the stent.  The proximal edge was just past the anastomosis.  The patient tolerated the  procedure well and left the laboratory in satisfactory condition.   RESULTS:  The left main coronary artery:  The left main coronary artery was  free of significant disease.   Left anterior descending:  The left anterior descending artery was  completely occluded at its origin.   Circumflex artery:  The circumflex artery gave rise to an intermediate  branch and an AV branch and then was completely occluded.  There was less  than 20% narrowing at the stent site in the intermediate branch.   Right coronary artery:  The right coronary is a moderately large vessel that  gave rise to a conus branch, right ventricular branch, posterior descending  branch, and two posterolateral branches.  There was less than 10% narrowing  at the stent site in the proximal right coronary artery.  This was a Cypher  stent placed within another stent for in-stent re-stenosis.  There was 40%  narrowing in the distal right coronary artery.   The saphenous vein graft to the distal  circumflex artery was patent.  There  was 40% narrowing in the midportion and 30% narrowing in the distal portion  of the vein graft.   The LIMA graft to the LAD was patent and functioned normally but there was  95% stenosis in the LAD just after the insertion site.   LEFT VENTRICULOGRAPHY:  The left ventriculogram performed in the RAO  projection showed overall good wall motion with an estimated ejection  fraction of 50%.  The rhythm was paced and there was some desynchrony in the  left ventricular contraction.   Following PTCA and stenting of the lesion in the LAD, the stenosis improved  from 95% to less than 10%.   CONCLUSIONS:  1. Coronary artery disease, status post prior coronary artery bypass surgery     in the past with prior multiple percutaneous interventions.  2. Severe native vessel disease with total occlusion in the left anterior     descending, less than 20% narrowing at the percutaneous transluminal     coronary angioplasty site within a previous stent in the intermediate     branch of the circumflex artery and total occlusion of the distal     circumflex artery, less than 10% stenosis at the Cypher stent in the     proximal right coronary artery, which was used to treat in-stent re-     stenosis six months ago and 40% narrowing in the distal right coronary     artery.  3. Patent left internal mammary artery graft to the left anterior descending     with 95% stenosis after the insertion site, and a patent vein graft to     the distal circumflex artery with 40% mid and 30% distal narrowing in the     vein graft.  4. Normal left ventricular function.  5. Successful stenting of the left anterior descending artery through the     left internal mammary artery graft with improvement in percent diameter     narrowing from 95% to less than 10%.   DISPOSITION:  The patient was returned to the postangioplasty unit for further observation.  Charlies Constable, M.D. [facility]    BB/MEDQ  D:  01/13/2002  T:  01/14/2002  Job:  [id number]   cc:   Corwin Levins, M.D. [facility]   Cardiopulmonary Laboratory

## 2010-06-17 NOTE — Discharge Summary (Signed)
NAME:  [name], [name]                        ACCOUNT NO.:  [id number]   MEDICAL RECORD NO.:  [id number]                   PATIENT TYPE:  INP   LOCATION:  3704                                 FACILITY:  [facility]   PHYSICIAN:  [place] Bing, M.D.               DATE OF BIRTH:  02/14/1944   DATE OF ADMISSION:  12/16/2002  DATE OF DISCHARGE:  12/19/2002                                 DISCHARGE SUMMARY   PROCEDURES:  1. Cardiac catheterization.  2. Coronary arteriogram.  3. Left ventriculogram.  4. Attempted percutaneous coronary intervention.   HOSPITAL COURSE:  [name] is a 65 year old male with a history of  coronary artery disease who had a CYPHER stent of the SVG to PDA done on  December 01, 2002.  On the day of admission, he had substernal chest pain and  pressure that radiated to his left arm and was associated with shortness of  breath.  Nitroglycerin decreased his pain and he was admitted to rule out MI  and for further evaluation.   [name] CK-MBs were significantly elevated but his troponins were  negative.  It was felt that cardiac catheterization was warranted and this  was performed on December 17, 2002.   The cardiac catheterization showed patent LIMA to LAD with the LAD totaled  proximally.  Distally, there was no critical disease.  The circumflex had  multiple 90% stenoses in the A-V groove.  The RCA had a 60% focal in-stent  restenosis but his LV was normal.  The SVG to OM was totaled.  Dr. Charlies Constable attempted PCI of the SVG to OM, but this was unsuccessful due to no  reflow.  Medical therapy was planned.   On December 18, 2002, he had no chest pain and no shortness of breath.  His  IV nitroglycerin was discontinued and he remained pain-free.   On December 19, 2002, he was doing well and is to be seen by cardiac rehab  prior to discharge.  Additionally, he was seen by the diabetes educators and  is being given a prescription for a Glucometer to  assist in monitoring his  diabetes.  He is diet only for now, as his hemoglobin A1c was 6.6 on  December 03, 2002.  Additionally, he had a lipid profile done that admission  which showed total cholesterol of 165, triglycerides 151, HDL 44 and LDL of  91.  His medications were adjusted at that time, so no recheck is due at  this time.  [name] is considered stable for discharge on December 19, 2002 and has outpatient followup arranged.   LABORATORY VALUES:  Hemoglobin is 13.0, hematocrit 37.6, WBC 5.3, platelets  165,000.  Sodium 139, potassium 3.7, chloride 105, CO2 27, BUN 15,  creatinine 0.9, glucose 96.   DISCHARGE CONDITION:  Improved.   DISCHARGE DIAGNOSES:  1. Non-ST-elevation myocardial infarction with unable to do percutaneous  coronary intervention, therefore medical therapy.  2. Preserved left ventricular function with patent left internal mammary     artery to left anterior descending by catheterization, this admission.  3. Status post myocardial infarction, December 01, 2002, with percutaneous     transluminal coronary angioplasty and stent of the saphenous vein graft     to posterior descending artery.  4. Status post percutaneous transluminal coronary angioplasty and stent to     the right coronary artery with 60% in-stent restenosis by     catheterization, this admission.  5. Status post Medtronic DDD pacemaker for atrioventricular block.  6. Hypertension.  7. Hyperlipidemia.  8. Diabetes.  9. Family history of coronary artery disease.  10.      Remote history of tobacco.  11.      Status post left arm surgery and right hand surgery.   DISCHARGE INSTRUCTIONS:  1. His activity level is to include no driving for five days and no     strenuous activity until cleared by M.D.  2. He is to stick to a low-fat, diabetic diet.  3. He is to call the office for problems with the catheterization site.  4. He is to follow up with Dr. Juanda Chance on January 14, 2003 at 10  a.m. and     with Dr. Corwin Levins as needed.   DISCHARGE MEDICATIONS:  1. Aspirin 325 mg daily.  2. Plavix 75 mg daily.  3. Lipitor 80 mg one-half tab daily.  4. Imdur 30 mg daily.  5. Lasix 20 mg daily.  6. Altace 2.5 mg daily.  7. K-Dur 20 mEq daily.  8. Toprol-XL 50 mg daily.  9. Nitroglycerin p.r.n.      [name], P.A. [facility]                  [name] Bing, M.D.    RG/MEDQ  D:  12/19/2002  T:  12/20/2002  Job:  [id number]   cc:   Charlies Constable, M.D.   Corwin Levins, M.D. [facility]

## 2010-06-17 NOTE — H&P (Signed)
NAME:  [name], [name]                        ACCOUNT NO.:  [id number]   MEDICAL RECORD NO.:  [id number]                   PATIENT TYPE:  INP   LOCATION:  1828                                 FACILITY:  [facility]   PHYSICIAN:  Creta Levin, M.D. [facility]      DATE OF BIRTH:  09-Mar-1945   DATE OF ADMISSION:  12/01/2002  DATE OF DISCHARGE:                                HISTORY & PHYSICAL   CARDIOLOGIST:  Dr. Charlies Constable.   PRIMARY CARE PHYSICIAN:  Dr. Corwin Levins.   CHIEF COMPLAINT:  Chest pain.   HISTORY OF PRESENT ILLNESS:  Mr. Massing is a 65 year old male with coronary  disease, who complains of his typical substernal chest pain that was 5/10 in  intensity and began later in the evening of November 30, 2002.  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  The pain lasted for about two to two and a half  hours before he decided to drive himself to the emergency department.  He  states that the pain worsened with exertion, was associated with shortness  of breath and diaphoresis.  He also states that on arrival to the emergency  department, he felt presyncopal.   PAST MEDICAL HISTORY:  1. Coronary artery disease.     a. CABG in 1991.     b. Multiple percutaneous coronary interventions with a total of four        stents.     c. Last procedure was in December of 2003.  His cath at that time showed        that his LIMA to the LAD was patent but that he had a 95% lesion in        the LAD distal to the anastomosis.  His vein to the left circumflex        was diseased with a 40% and a 30% lesion, respectively.  He also had a        complete occlusion of the circumflex prior to that anastomosis but an        open vessel distal to the anastomosis.  His RCA had less than 10%        lesion in-stent and a 40% distal.  His ramus intermedius had a less        than 20% in-stent lesion.     d. PCI of the LAD in  December of 2003 with a Pixel stent.  2. Dual-chamber pacemaker implantation (Medtronic Kappa DDD) for A-V block     and bradycardia.  3. Hyperlipidemia.  4. Hypertension.   SOCIAL HISTORY:  He lives in [place] with his wife.  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  He has  a 50-pack-year smoking history but quit in 1992.  He has no significant  alcohol intake and  does not use illicit drugs.   FAMILY HISTORY:  His mother died at age 76 and did not have coronary  disease.  His father died at age 25 of a stroke.  He has two sisters with  coronary disease, one who died with a massive MI.   ALLERGIES:  The patient has no known drug allergies.   MEDICATIONS:  1. Aspirin 325 mg daily.  2. Multivitamin.  3. Cholesterol medication, probably Lovastatin.  4. Plavix, which was completed two months ago.   REVIEW OF SYSTEMS:  He has complaints of presyncope and the last episode  prior to this was three days ago.  He has no orthopnea, PND, or edema.  The  rest of his 10-point review of systems was negative.   PHYSICAL EXAMINATION:  VITAL SIGNS:  Temperature is 98.0, blood pressure  126/77, heart rate 65, respiratory rate 20.  He is saturating 95% on room  air.  GENERAL:  In general, he is in no acute distress.  HEENT:  Unremarkable.  His mucous membranes are moist.  NECK:  Neck reveals no jugular venous distention or carotid bruits.  CARDIOVASCULAR:  Exam revealed a regular rate and rhythm without gallop or  murmur.  LUNGS:  His lungs are clear.  ABDOMEN:  His abdominal exam was benign.  EXTREMITIES:  His extremities revealed no edema.  NEUROLOGIC:  His neurologic exam was nonfocal.   LABORATORY AND ACCESSORY CLINICAL DATA:  Chest x-ray showed low lung  volumes, possible bibasilar atelectasis.  He has a dual-chamber implanted  and is on the left side of his chest.   His ECG reveals a paced rhythm at 62 beats per minute.  There is a  left  bundle branch pattern.   His labs are pending except for the first set of cardiac markers in which  the CK was 146, MB 8.8, troponin I of less than 0.05.   ASSESSMENT AND PLAN:  Chest pain, probable unstable angina.  The patient  will be admitted to telemetry.  He will be treated with aspirin, metoprolol,  Captopril, and high-dose Lipitor.  I will also start him with 300 mg of  Plavix and then 75 mg per day, given that he is unlikely to be undergoing a  redo bypass surgery at this time.  I started him on Lovenox and Integrilin.  We will continue cycling his cardiac markers and probably take him to the  catheterization laboratory within the next day or two.                                                Creta Levin, M.D. [facility]    RPK/MEDQ  D:  12/01/2002  T:  12/01/2002  Job:  [id number]   cc:   Corwin Levins, M.D. [facility]   Charlies Constable, M.D.

## 2010-06-17 NOTE — Cardiovascular Report (Signed)
NAME:  [name], [name]                        ACCOUNT NO.:  [id number]   MEDICAL RECORD NO.:  [id number]                   PATIENT TYPE:  INP   LOCATION:  2041                                 FACILITY:  [facility]   PHYSICIAN:  Charlies Constable, M.D.                  DATE OF BIRTH:  02/14/1944   DATE OF PROCEDURE:  12/17/2002  DATE OF DISCHARGE:                              CARDIAC CATHETERIZATION   CLINICAL HISTORY:  [name] is 65 years old and has had prior bypass  surgery.  He has had prior stenting of the right coronary artery and three  weeks ago had stenting of the vein graft to the marginal branch of the  circumflex artery.  He was admitted yesterday with recurrent chest pain with  positive enzymes consistent with a non-ST elevation infarction.  He also has  had a previous pacemaker implanted.   PROCEDURE:  The procedure was performed via the right femoral artery using  arterial sheath and 6 French preformed coronary catheters.  A front wall  arterial  puncture was performed and Omnipaque contrast was used.  LIMA  catheter was used for injection of the LIMA graft.  After completion of the  diagnostic study, made decision to proceed with intervention on the totally  occluded saphenous vein graft to the marginal branch of the circumflex  artery.   The patient was already on Integrelin drip and was given additional heparin  and ACT greater than 200 seconds.  We used an AL1 and then an AL2 guiding  catheter.  We initially tried to cross with a PT2 wire but were  unsuccessful.  We were also unsuccessful with a whisper wire.  We were  finally able to cross with a Hi-Torque intermediate wire.  This advanced  down the distal vessel and we passed an over-the-wire balloon and did distal  injection to document that we were in the lumen.  Lumen looked reasonable  past the mid portion of the graft.  We then dilated with a 2.5 x 20-mm  balloon in the proximal vessel, but this did not establish  flow.  It was my  feeling that we were probably under a plaque when we crossed the lesion  proximally.  We then dilated with a 2.5 x 30-mm balloon with several  inflations up to 8 atmospheres for 30 seconds.  Once again, we were unable  to obtain good flow distally.  We did another distal injection through an  over-the-wire catheter distally and documented the vessel was still patent  distally.  For this reason, we thought the problem was probably at the  proximal lesion and so we decided to stent this with a 2.5 x 28-mm Cypher  stent.  We deployed this with one inflation at 12 atmospheres for 30  seconds.  This established patency in the proximal vessel, but there was  only TIMI-1 flow down the distal vessel.  We  performed several export runs  with aspiration of some thrombus.  We also instilled distal Verapamil and  distal adenosine.  Despite all these attempts, we were never able to obtain  good flow down the distal vessel.   It was my impression that there was probably a new ruptured plaque in the  proximal portion of the vein graft rather than acute thrombosis of the  recently placed stent.  Part of the problem with poor distal flow was  probably related to the fact that we initially had inadequate  opening in  the proximal lesion probably due to the fact that we were under a plaque.  Later, I think the distal flow was poor because of distal embolization with  poor refill.  We were never able to protect the vein graft because we could  never visualize the distal vessel well enough.   It was a long procedure, but the patient tolerated the procedure well and  left the laboratory in stable condition.   RESULTS:  Left main coronary artery:  The left main coronary was free of  significant disease.   Left anterior descending artery:  Left anterior descending artery was  completed occluded at its origin.   Circumflex artery:  The circumflex artery gave rise to a marginal branch and   an AV branch.  The AV branch had multiple 90% stenoses and the second  marginal branch was completely occluded.  There was a small posterior  lateral branch that fell from the AV circumflex artery.   Right coronary artery:  The right coronary artery is a moderate size vessel  that gave rise to a conus branch, right ventricular branch, posterior  descending  branch, and two posterior lateral branches.  There was 60%  narrowing within the mid portion of the stent in the mid right coronary  artery.   LEFT VENTRICULOGRAM:  The left ventriculogram performed in the RAO  projection showed good wall motion with no areas of hypokinesis.  The  estimated ejection fraction was 60%.   The left ventriculogram performed in the LAO projection showed good wall  motion with no areas of hypokinesis.   The saphenous vein graft to the marginal branch of the circumflex artery was  completed occluded at its origin.   The LIMA graft to the LAD was patent and functioned normally.   The aortic pressure was 102/68 with mean of 82 and left ventricular pressure  was 102/12.   Following attempts to open the vein graft to the marginal branch of the  circumflex artery, there remained only TIMI-1 flow down the vessel.   CONCLUSIONS:  1. Coronary disease status post prior coronary artery bypass graft surgery     and multiple prior percutaneous coronary interventions.  2. Severe native vessel disease with total occlusion of the left anterior     descending, 90% stenosis in the AV circumflex artery with total occlusion     of the second marginal branch and 60% focal in-stent restenosis in the     mid right coronary artery.  3. Patent LIMA graft to the left anterior descending and new occluded vein     graft to the marginal branch of the circumflex artery which has had a     stent placed three weeks earlier.  4. Normal left ventricular function. 5. Unsuccessful attempt at percutaneous coronary intervention of the  vein     graft in the marginal branch of the circumflex artery due to no reflow.   DISPOSITION:  The patient returned to the postangioplasty unit for further  observation.                                               Charlies Constable, M.D.    BB/MEDQ  D:  12/17/2002  T:  12/17/2002  Job:  [id number]   cc:   Corwin Levins, M.D. [facility]   Lauris Poag, M.D.  [address]  [place]  [place] [address]  Fax: [phone]

## 2010-06-17 NOTE — H&P (Signed)
NAME:  [name], [name]                        ACCOUNT NO.:  [id number]   MEDICAL RECORD NO.:  [id number]                   PATIENT TYPE:  INP   LOCATION:  4715                                 FACILITY:  [facility]   PHYSICIAN:  Jesse Sans. Wall, M.D. [facility]            DATE OF BIRTH:  02/14/1944   DATE OF ADMISSION:  08/26/2001  DATE OF DISCHARGE:                                HISTORY & PHYSICAL   CHIEF COMPLAINT:  Chest pain, requiring EMS to bring him in to the hospital.   HISTORY OF PRESENT ILLNESS:  [name] is a 65 year old married male.  He  has a history of coronary artery disease and a heart attack in June 2003.  He had a stent placed twice.   Dictation ends.                                               Thomas C. Daleen Squibb, M.D. [facility]    TCW/MEDQ  D:  08/26/2001  T:  08/28/2001  Job:  [id number]   cc:   Everardo Beals. Juanda Chance, M.D. [facility]  [address]  [place]  [place] [address]   Corwin Levins, M.D. [facility]

## 2010-06-17 NOTE — Discharge Summary (Signed)
[facility]  Patient:    [name], [name]                     MRN: [id number] Adm. Date:  [id number] Disc. Date: 06/08/00 Attending:  Daisey Must Dictator:   Abelino Derrick, P.A.C. [facility]                  Referring Physician Discharge [facility]  HISTORY OF PRESENT ILLNESS:  [name] is a 65 year old male who has a history of coronary disease.  He had bypass surgery in 1992 with LIMA to LAD and SVG to the circumflex.  He is followed by Daisey Must, M.D., in our office.  He presented with chest pain.  HOSPITAL COURSE:  He was admitted to telemetry and started on heparin. Enzymes were positive with a CK of 558 and 17 MB.  The patient underwent cardiac catheterization on Jun 06, 2000, by Everardo Beals. Juanda Chance, M.D., which revealed a 50% RCA, 80% circumflex, 99% OM, and total native LAD.  The LIMA to LAD was patent and the SVG to the PL had a 30-40% narrowing.  Bruce Elvera Lennox Juanda Chance, M.D., performed a stenting to the circumflex OM with good final results.  The EF was 55%.  Postoperatively he did develop a hematoma in his right groin. Duplex showed no evidence of a pseudoaneurysm.  Lewayne Bunting, M.D., feels that he can be discharged on Jun 08, 2000.  Dr. Andee Lineman ordered a hemoglobin AC which is pending at the time of this dictation and will need to be checked when he is followed up in the office Monday by Daisey Must, M.D.  DISCHARGE MEDICATIONS: 1. Niaspan SR 500 mg h.s. 2. Altace 5 mg a day. 3. Plavix.  He is sent home on Plavix for at least four week, possibly eight    weeks.  This will need to be decided by Daisey Must, M.D. 4. Toprol XL 25 mg a day. 5. Nitroglycerin sublingual p.r.n. 6. Coated aspirin q.d.  LABORATORY DATA:  Sodium 139, potassium 4.1, BUN 16, creatinine 1.1.  White count 7.0, hemoglobin 13.3, hematocrit 37.7, platelets 154.  Cholesterol 135, HDL 35, LDL 83.  The EKG showed sinus rhythm and small inferior Qs.  The chest x-ray in the  ER showed an elevated left hemidiaphragm, but no CHF.  DISPOSITION:  The patient is discharged in stable condition.  FOLLOW-UP:  He has a previous appointment with Daisey Must, M.D., on Monday and he will keep this.  At that time, he will need a follow-up of his hemoglobin A1C and Dr. Gerri Spore can decide about the duration of his Plavix. He will also need LFTs and a fasting lipid profile in six weeks. DD:  06/08/00 TD:  06/08/00 Job: [id number]

## 2010-06-17 NOTE — Cardiovascular Report (Signed)
NAME:  JASHER, BARKAN NO.:  [id number]   MEDICAL RECORD NO.:  [id number]          PATIENT TYPE:  INP   LOCATION:  6532                         FACILITY:  [facility]   PHYSICIAN:  Charlies Constable, M.D. [facility] DATE OF BIRTH:  [birth year]   DATE OF PROCEDURE:  11/02/2004  DATE OF DISCHARGE:                              CARDIAC CATHETERIZATION   CLINICAL HISTORY:  Mr. Olano is 65 years old and had previous bypass  surgery and has had previous known occlusion of the saphenous vein graft to  the circumflex artery.  In July he had recurrent chest pain and had stenting  of the mid circumflex artery and __________ angioplasty and had ostial  lesion of the circumflex artery. He was admitted yesterday with recurrent  chest pain suggestive of unstable angina.   It does not show angina. His troponins were negative.   PROCEDURE:  The procedure was performed via the right femoral artery  utilizing an arterial sheath and 6-French preformed coronary catheters. A  front wall arterial puncture was performed, and Omnipaque contrast was used.  A LIMA catheter was used __________ LIMA graft.  We did not close the right  femoral artery, because the stick was located at the bifurcation.   RESULTS   LEFT MAIN CORONARY ARTERY:  Left main coronary artery is free of significant  disease.   LEFT ANTERIOR DESCENDING ARTERY:  Left anterior descending artery was  complete and clear at the ostium.   CIRCUMFLEX ARTERY:  The circumflex artery gave rise to a ramus branch which  bifurcated into two subbranches and then an AV circumflex artery was  terminated into two posterolateral branches. There was 70% restenosis at the  ostium of the circumflex artery; and there was 0% stenosis at the stent in  the proximal-to-mid circumflex artery.   RIGHT CORONARY ARTERY:  The right coronary artery is a moderate-size vessel  that gave rise a right ventricular branch, a posterior descending branch,  and four  posterolateral branches. There was 0% stenosis at the stent site in  the mid right coronary artery and 50-60% stenosis in the mid right coronary  artery the stent.  There was 40% distal stenosis in the right coronary  artery.   The LIMA graft to the LAD was patent and functioned normally.  This LAD was  irregular and there was a 50% narrowing in the mid-to-distal vessel.   LEFT VENTRICULOGRAM:  The left ventriculogram performed in the RAO  projection showed good wall motion with no areas of hypokinesis.  The  estimated ejection fraction was 60%.   PRESSURES:  1.  The aortic pressure was 124/77 with a mean of 98.  2.  The left ventricular pressure was 124/20.   CONCLUSIONS:  1.  Coronary artery disease status post coronary artery bypass graft      surgery.  2.  Severe native vessel disease with total occlusion of the left anterior      descending artery, 70% ostial stenosis in the circumflex artery with 0%      stenosis at the stent site in the circumflex artery; and 50-60% stenosis  in the mid right coronary artery with 0% stenosis at the stent site.  3.  Patent LIMA graft to the LAD with 50% distal stenosis in the LAD and      occlusion of the vein graft to the circumflex artery (old).  4.  Normal LV function.   RECOMMENDATIONS:  Mr. Valentino has developed restenosis at the ostial lesion  of the circumflex artery. I am not certain that this is tight enough to  cause ischemia; and I am not certain that it is tight enough to cause rest  pain.  I  reviewed the films with Dr. Samule Ohm and we think that initial  medical management will be indicated.  If his symptoms persist and we feel  they are ischemic then we could consider a stenting of the ostial lesion.  I  doubt that the vessel of the posterolateral branches would be large enough  to show up on a Cardiolite scan.           ______________________________  Charlies Constable, M.D. [facility]     BB/MEDQ  D:  11/02/2004  T:  11/02/2004   Job:  [id number]   cc:   Corwin Levins, M.D. [facility]  [address]  [place]  [place] [address]   Cardiopulmonary Lab

## 2010-06-17 NOTE — Discharge Summary (Signed)
NAME:  [name], [name]                        ACCOUNT NO.:  [id number]   MEDICAL RECORD NO.:  [id number]                   PATIENT TYPE:  OIB   LOCATION:  6523                                 FACILITY:  [facility]   PHYSICIAN:  Lavella Hammock, P.A. [facility]            DATE OF BIRTH:  02/14/1944   DATE OF ADMISSION:  01/13/2002  DATE OF DISCHARGE:  01/14/2002                           DISCHARGE SUMMARY - REFERRING   PROCEDURE:  1. Cardiac catheterization with coronary arteriogram.  2. Graft angiogram.  3. A percutaneous coronary angiography and stent to one vessel.   HISTORY OF PRESENT ILLNESS:  The patient is a 64 year old male with known  coronary artery disease who was seen in the office on January 10, 2002.  He  was complaining of occasional shortness of breath with exertion, and a  Cardiolite showed apical ischemia.  His ejection fraction was 51%.  There  was no ischemia in the distribution of the previous stent.  Because of this,  he was scheduled for a cardiac catheterization to evaluate his coronary  status.   HOSPITAL COURSE:  He was admitted for this on January 13, 2002.  The  cardiac catheterization showed left main that was patent, with an LAD that  was totalled proximally.  The LIMA to the LAD was patent but there was a 95%  stenosis in the LAD distal to the anastomosis.  The SVG to circumflex had a  40% mid, and a 30% distal stenosis.  The circumflex/OM was totalled, but  there was no distal disease to the graft insertion.  The RCA had less than  10% stenosis at the previous stent site, and there was a distal 40% lesion.  The ramus intermedius also had a previous stent site, but showed less than  20% stenosis, and his ejection fraction was about 50%.  He had a Pixel stent  placed to the LAD, reducing that stenosis from 95% to less than 10%, with  TIMI-3 flow.  He tolerated the procedure well and the sheath was removed  without difficulty.  The next day his groin was stable,  and he had no chest  pain with ambulation.  He had slight elevation in his cardiac enzymes post-  procedure, but it was felt that this was secondary to the procedure itself,  and not indicative of any new blockages or damage.   DISPOSITION:  He was considered stable for discharge on January 14, 2002.   LABORATORY DATA:  Hemoglobin 14.4, hematocrit 41.5, WBC 6.8, platelets 115.  Sodium 141, potassium 4.6, chloride 107, CO2 of 22, BUN 11, creatinine 1.0,  glucose 135.  Post-procedure CK-MB 213/10.6.  Chest x-ray:  No acute abnormality, stable cardiomegaly, with evidence of  cardiac surgery, pacemaker, and left basilar scarring.   CONDITION ON DISCHARGE:  Improved.   DISCHARGE DIAGNOSES:  1. Unstable angina pain, status post percutaneous coronary angiography and     stent to the left anterior descending  coronary artery this admission.  2. Status post aortocoronary bypass surgery in 1992, with left internal     mammary artery to the left anterior descending artery, and saphenous vein     graft to the posterolateral branch of the circumflex artery.  3. History of a myocardial infarction x2.  4. Status post percutaneous coronary angiography and stent to the     circumflex, obtuse marginal.  5. Status post percutaneous coronary angiography and stent to the right     coronary artery.  6. Status post percutaneous coronary angiography with a cutting balloon to     the obtuse marginal.  7. Status post percutaneous coronary angiography and cutting balloon for in-     stent restenosis of the proximal right coronary artery and ramus     intermedius at previous stent site.  8. Medtronic Kappa DDD pacemaker.  9. History of thrombocytopenia.  10.      Family history of coronary artery disease.  11.      Hypertension.  12.      Hyperlipidemia.  13.      Osteoarthritis.   DISCHARGE INSTRUCTIONS:  1. His activity level is to include no driving today.  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  2.  He is to stick to a low-fat, low-salt diet.  3. To call the office for problems with the catheterization site.  4. He is to follow up with Dr. Charlies Constable, and has a P.A. appointment on     February 07, 2002, at 10 a.m.  5. He is to follow up with Dr. Corwin Levins as needed.   DISCHARGE MEDICATIONS:  1. Multivitamin q.d.  2. Aspirin 325 mg q.d.  3. Plavix 75 mg q.d.  4. Lovastatin 40 mg, 1/2 tab q.d.                                                 Lavella Hammock, P.A. [facility]    RG/MEDQ  D:  01/14/2002  T:  01/14/2002  Job:  [id number]   cc:   Corwin Levins, M.D. [facility]

## 2010-06-17 NOTE — Procedures (Signed)
NAMEDERRIAN, POLI              ACCOUNT NO.:  [id number]   MEDICAL RECORD NO.:  [id number]          PATIENT TYPE:  INP   LOCATION:  A201                          FACILITY:  [facility]   PHYSICIAN:  Gerrit Friends. Dietrich Pates, MD, FACCDATE OF BIRTH:  1945-07-16   DATE OF PROCEDURE:  10/30/2005  DATE OF DISCHARGE:                                  ECHOCARDIOGRAM   REFERRING PHYSICIAN:  Osvaldo Shipper, MD   CLINICAL DATA:  A 65 year old gentleman with coronary disease and dyspnea.   M-MODE TRACINGS:  Aorta 4.2, left atrium 3.8, septum 1.3, posterior wall  1.4, LV diastole 3.7, LV systole 3.1.   RESULTS:  1. Technically suboptimal and somewhat limited echocardiographic study.  2. Normal left atrium, right atrium and right ventricle; pacing wire      visualized traversing the right heart.  3. Normal aortic valve.  4. Mild aortic root dilatation with mild calcification of the wall of the      ascending aorta.  5. Normal mitral valve; tricuspid valve not ideally imaged, but is grossly      normal.  6. Normal left ventricular size; LVH present; abnormal septal motion      consistent with a paced rhythm.  There is hypokinesis of the mid and      distal septum with mildly impaired overall left ventricular systolic      function.  Apparent dyskinesis is present in the very basilar portion      of the inferior wall.  Estimated ejection fraction is 0.45.      Gerrit Friends. Dietrich Pates, MD, [facility]  Electronically Signed     RMR/MEDQ  D:  10/30/2005  T:  10/31/2005  Job:  [id number]

## 2010-06-17 NOTE — Cardiovascular Report (Signed)
NAME:  [name], [name]                        ACCOUNT NO.:  [id number]   MEDICAL RECORD NO.:  [id number]                   PATIENT TYPE:  INP   LOCATION:  2027                                 FACILITY:  [facility]   PHYSICIAN:  Charlies Constable, M.D. [facility]              DATE OF BIRTH:  02/14/1944   DATE OF PROCEDURE:  10/07/2003  DATE OF DISCHARGE:                              CARDIAC CATHETERIZATION   CLINICAL HISTORY:  [name] is 64 years old and has had prior bypass  surgery and had prior pacemaker implantation.  He had stenting of the vein  graft of the circumflex artery and subsequently occluded this stent with an  unsuccessful attempt at reintervention.  This was approximately a year ago.  He was readmitted yesterday with recurrent chest pain felt to possibly  represent unstable angina.  He was enrolled in the ACUITY trial.   PROCEDURE:  The procedure was performed via the right femoral artery using  arterial sheath and 6 French preformed coronary catheters. A front wall  arterial puncture was performed and Omnipaque contrast was used.  A LIMA  catheter was used for injection of the left internal mammary artery graft.  A JR-4 catheter was used for injection of the vein graft.  Right femoral  artery was closed with Angio-Seal at the end of the procedure.  The patient  tolerated the procedure well and left the laboratory in satisfactory  condition.   RESULTS:  The aortic pressure was 125/81 with mean of 101.  Left ventricular  pressure was 125/18.   Left main coronary artery:  The left main coronary was free of significant  disease.   Left anterior descending artery:  The left anterior descending artery is  completely occluded near its origin.   Circumflex artery:  The circumflex artery gave rise to a first marginal  branch, second marginal branch was completely occluded and a small posterior  lateral branch.  There were tandem 90% stenoses in the distal circumflex  artery which  was a small diffusely diseased vessel.  These blockages were  old.   Right coronary artery:  The right coronary artery is a moderately large  vessel that gives rise to a right ventricular branch, posterior descending  branch and three posterior lateral branches.  There was 60% narrowing in the  mid right coronary artery within the previously placed stent.   The saphenous vein graft to the circumflex artery was completely occluded at  its origin.  This was old.   The LIMA graft to the LAD was patent and functioned normally.  There was an  80% lesion in the mid LAD after the graft insertion site.  The distal LAD  was a very small vessel that appeared to be only 1 mm in caliber and the  LIMA graft was quite tortuous.   LEFT VENTRICULOGRAM:  The left ventriculogram performed in the RAO  projection showed good wall motion with  no areas of hypokinesis.  The  estimated ejection fraction was 60%.   CONCLUSIONS:  1.  Coronary artery disease status post prior coronary artery bypass graft      surgery.  2.  Severe native vessel disease with total occlusion of the left anterior      descending artery, total occlusion of the second marginal branch of the      circumflex artery with multiple 90% stenoses in the distal vessel, 60%      narrowing in the mid right coronary artery within the stent.  3.  Total occlusion of the saphenous vein graft to the circumflex artery      (old) and patent left internal mammary artery graft to the left anterior      descending with 80% narrowing in the mid left anterior descending after      the graft insertion site.  4.  Normal left ventricular wall motion with an estimated ejection fraction      of 60%.   RECOMMENDATIONS:  The patient has developed a new disease in the mid LAD.  This is a very small caliber vessel and the LIMA graft itself is quite  tortuous.  This would make intervention quite difficult.  The patient's  symptoms are not totally typical.  I  think we should plan initial medical  therapy and plan an outpatient Cardiolite and I will follow up with the  patient in about two weeks.                                               Charlies Constable, M.D. [facility]    BB/MEDQ  D:  10/07/2003  T:  10/07/2003  Job:  [id number]   cc:   Corwin Levins, M.D. [facility]

## 2010-06-17 NOTE — Discharge Summary (Signed)
[name], PULLARA NO.:  [id number]   MEDICAL RECORD NO.:  [id number]          PATIENT TYPE:  INP   LOCATION:  2905                         FACILITY:  [facility]   PHYSICIAN:  Charlton Haws, M.D.     DATE OF BIRTH:  1945-12-24   DATE OF ADMISSION:  05/04/2004  DATE OF DISCHARGE:  05/06/2004                                 DISCHARGE SUMMARY   PROCEDURES:  Cardiac catheterization May 05, 2004.   REASON FOR ADMISSION:  Mr. Scheffler is a 65 year old male with known coronary  artery disease status post coronary artery bypass graft, followed by Dr.  Charlies Constable, who presented to the emergency room with acute coronary  syndrome.  Please refer to dictated admission note for full details.   LABORATORY DATA:  Potassium 3.8, BUN 10, creatinine 0.9 at discharge.  Cardiac enzymes:  Peak CPK 614/17 (2.8%).  On admission Troponin I less than  0.01.  CBC normal on admission with hematocrit 40 and platelets 160.   Admission chest x-ray:  Vascular congestion, cardiomegaly.  No frank edema.   HOSPITAL COURSE:  The patient was admitted by Dr. Charlton Haws for  management and further evaluation of acute coronary syndrome, in the setting  of known coronary artery disease.  He was placed on intravenous  nitroglycerin and heparin with plans to proceed with cardiac  catheterization.  Lasix was placed on hold on admission.   Serial cardiac markers with abnormal (see lab data) with elevated total CPK.  However, serial troponin markers were normal.   The patient underwent cardiac catheterization the following by Dr. Charlies Constable (see report for details) revealing widely patent LIMA, LAD graft and  known 100% saphenous vein graft circumflex graft stenosis.  Native anatomy  notable for 70% distal LAD disease with small vessel, multiple 90%  mid/distal circumflex lesions, less than 10% in-stent proximal RCA stenosis  with 50-60% mid and 40% distal RCA disease.   Left ventricular function  was preserved (55%).   Dr. Juanda Chance suspected that the AV/circumflex artery was the culprit, but  given that this is a small vessel, he recommended medical therapy.   The patient was kept for overnight observation and clear to discharge the  following morning by Dr. Charlies Constable.  There were no noted complications of  the right groin.   Secondary to financial constraints (the patient in the process of reapplying  for Medicaid), the patient was referred to care management for financial  assistance at time of discharge.  Additionally, Dr. Juanda Chance adjusted his home  medication regimen; Norvasc was discontinued, Altace substituted with  Enalapril, and Lipitor substituted with Mevacor.   DISCHARGE MEDICATIONS:  1.  Plavix 75 mg daily.  2.  Aspirin 81 mg daily.  3.  Mevacor 40 mg q.h.s.  4.  Vasotec 10 mg b.i.d.  5.  Furosemide 40 mg daily.  6.  Potassium 20 mEq daily.  7.  Celexa 20 mg daily.  8.  Imdur 90 mg daily.  9.  Actos 30 mg daily.  10. Toprol XL 50 mg daily.  11. ACUITY study drug as directed.  12. Nitrostat 0.4 mg p.r.n.   Follow up with Dr. Charlies Constable on Wednesday, May 10 at 3:45 p.m.   DISCHARGE DIAGNOSES:  1.  Acute coronary syndrome.      1.  Status post cardiac catheterization May 05, 2004.  High grade          AV/circumflex disease not amenable to percutaneous intervention.      2.  Widely patent left internal mammary artery, left anterior descending          grade.  Chronic 100% saphenous vein graft- circumflex graft, widely          patent proximal right coronary artery stent site.      3.  Preserved left ventricular function.      4.  Two-vessel coronary artery bypass graft in 1992.  2.  Status post dual-chamber permanent pacemaker.  3.  Hypertension.  4.  Dyslipidemia.  5.  Type 2 diabetes mellitus.  6.  Obesity.      GS/MEDQ  D:  05/06/2004  T:  05/06/2004  Job:  [id number]

## 2010-06-17 NOTE — Cardiovascular Report (Signed)
[name], [name]              ACCOUNT NO.:  [id number]   MEDICAL RECORD NO.:  [id number]          PATIENT TYPE:  INP   LOCATION:  6523                         FACILITY:  [facility]   PHYSICIAN:  Everardo Beals. Juanda Chance, MD, FACCDATE OF BIRTH:  11-Oct-1945   DATE OF PROCEDURE:  03/30/2006  DATE OF DISCHARGE:  03/31/2006                            CARDIAC CATHETERIZATION   Cardiac catheterization graft study and percutaneous coronary  intervention note.   HISTORY:  Mr. Liebman is 65 years old and has had a previous bypass  surgery and multiple percutaneous coronary artery interventions.  He had  a Taxus stent placed in the AV circumflex in 2006.  He had previous PCI  to the saphenous vein graft to the circumflex artery which subsequently  became completely occluded.  He has also had a bare-metal stent placed  just near the anastomosis of the LIMA to the LAD in 2002 with 2.25 x 13-  mm Multilink pixel stent.  He has also had a Cypher stent placed in the  proximal right coronary artery.  That was performed in 2003.  He was  admitted with congestive heart failure and chest pain and had positive  MBs and CKs consistent with a non-ST-elevation infarction, although his  troponins were negative.  He was diuresed, and we made a decision to  proceed with intervention today.  He had a Myoview scan today which  showed anterior and apical ischemia with an ejection fraction of 43%.   PROCEDURE:  The procedure was performed via the left femoral artery  using an arterial sheath and 6-French preformed coronary catheters.  A  front-wall arterial puncture was performed, and Omnipaque contrast was  used.  Right heart catheterization was performed percutaneously via the  left femoral vein using a venous sheath and Swan-Ganz Thermodilution  catheter.  After completion of the diagnostic study, we made a decision  to proceed with intervention on the lesion in the LAD through the LIMA  graft.   The patient was  given Angiomax bolus and infusion and had previously  been on Plavix.  We used a 6-French LIMA guiding catheter with side  holes.  We passed a PT2 light support wire down the tortuous mammary  artery and across the lesion in the mid LAD without too much difficulty.  We direct stented with a 2 x 8-mm mini Vision stent and deployed this  with one inflation of 12 atmospheres for up to 30 seconds.  We felt the  stent was slightly oversized for the vessel, even with a small stent at  12 atmospheres.  This gave a very good result with a step-up and step-  down, and we elected not to post dilate the stent.  The patient  tolerated the procedure well and left the laboratory in satisfactory  condition.  The femoral artery was not suitable for closure.   RESULTS:  The left main coronary artery was free of significant disease.   Left anterior descending artery was completely occluded near its origin.   The circumflex artery gave rise to a marginal branch and an AV branch  which terminated into  2 posterolateral branches.  The stent in the mid  portion of the circumflex artery had 0% stenosis.  There was 70-80%  stenosis at the proximal and the mid portion of the circumflex artery  just distal to the marginal branch and ostial to the marginal branch and  just before the drug-eluting stent.   The right coronary artery is a dominant vessel that gave rise to a conus  branch, a right ventricular branch, a posterior descending branch and  two posterolateral branches.  There was less than 20%narrowing within  the Cypher stent in the proximal right coronary artery.  There was 50%  narrowing in the mid vessel and 40% narrowing in the mid-to-distal  vessel.   The saphenous vein graft to the circumflex artery was completely  occluded at its origin.  This was an old occlusion.   The LIMA graft to LAD was patent and functioned normally.  There was a  stent just distal anastomoses in the LAD which had 40%  narrowing.  There  was 90% narrowing in the mid LAD distal to the first stent.   Left ventriculogram performed in the RAO projection showed good wall  motion with no areas of hypokinesis.  The estimated ejection fraction  was 55%.   HEMODYNAMIC DATA:  The right arterial pressure was 3 mean.  The right  pulmonary artery pressure was 24/10 with mean of 17.  Pulmonary wedge  pressure was 7 mean.  The left ventricular pressure was 105/4.  Aortic  pressure was 105/79 with a mean of 92.  Cardiac output/cardiac index was  3.8/1.6 liters/minutes/meter squared by Fick.   Following stenting of the lesion in the LAD, stenosis improved from 90%  to 0%.   CONCLUSION:  1. Coronary artery status post prior coronary bypass graft surgery and      multiple percutaneous interventions.  2. Severe native vessel disease with total occlusion of the left      anterior descending artery, 0% stenosis at the stent site in the      mid circumflex artery with 70-80% stenosis in the circumflex artery      just proximal to the stent and ostial to a first marginal branch,      less than 20% narrowing at the stent site in the proximal right      coronary with 50% narrowing in the mid right coronary artery.  3. Occluded vein graft to the circumflex artery (old), patent left      internal mammary artery graft to the left anterior descending      artery with 40% narrowing at the stent site just distal to the      anastomosis and 90% stenosis in the mid left anterior descending      artery distal to the first stent.  4. Normal left ventricular wall motion with an estimated fraction of      55%.  5. Successful percutaneous coronary intervention of the lesion in the      mid left anterior descending artery using a bare-metal stent with      improvement in sentinel narrowing from 90% to 0%.   DISPOSITION:  The patient returned to the __________ for further observation.  He may be able to discharge tomorrow.  If he has  recurrent  symptoms, we may consider further evaluation of the circumflex lesion  with IVUS.      Bruce Elvera Lennox Juanda Chance, MD, [facility]  Electronically Signed     BRB/MEDQ  D:  03/30/2006  T:  03/31/2006  Job:  [id number]   cc:   Corwin Levins, MD  Everardo Beals. Juanda Chance, MD, [facility]  Cardiopulmonary Lab

## 2010-06-17 NOTE — Discharge Summary (Signed)
[name], [name]              ACCOUNT NO.:  [id number]   MEDICAL RECORD NO.:  [id number]          PATIENT TYPE:  INP   LOCATION:  6523                         FACILITY:  [facility]   PHYSICIAN:  Bevelyn Buckles. Bensimhon, MDDATE OF BIRTH:  08/10/1945   DATE OF ADMISSION:  03/28/2006  DATE OF DISCHARGE:  03/31/2006                               DISCHARGE SUMMARY   DISCHARGING DIAGNOSES:  1. Acute on chronic diastolic congestive heart failure, currently      stable, ejection fraction of 55% by cardiac catheterization this      admission, previously 45% by echocardiogram.  2. Coronary artery disease status post stress test this admission      positive for ischemia, status post cardiac catheterization; the      patient had placement of a bare-metal stent to the left anterior      descending artery through the left internal mammary artery graft.  3. History of coronary artery disease, myocardial infarction, status      post multiple interventions and coronary artery bypass grafting  4. Hypertension.  5. Hyperlipidemia.  6. Type 2 diabetes.  7. Obesity.  8. 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  9. History of prostate cancer, benign prostatic hypertrophy.  The      patient previously on Proscar.  10.History of thrombocytopenia, stable at this time.   HOSPITAL COURSE:  Mr. Eakle is a 65 year old Caucasian gentleman with  longstanding history of coronary artery disease status post CABG along  with multiple interventions who presented to [facility] with acute  shortness of breath, chest discomfort.  Recent echocardiogram done in  October of last year showed an EF of 45%.  The patient states he has  been feeling good; however, over the last 2 months, he has began having  chest discomfort that has increased in frequency and relieved with rest  and nitroglycerin.  On Sunday prior to this admission, he developed  worsening dyspnea on  exertion as well as increasing abdominal girth and  peripheral edema and a weight gain.  He doubled his Lasix for 3 to 4  days without improvement.  He came to the emergency room for further  evaluation.  The patient was admitted, started on IV furosemide.  The  patient scheduled for cardiac catheterization for reevaluation of  grafts.  Also proceeded with a stress Myoview to help guide therapy.  Stress Myoview positive for inducible ischemia.  The patient to the cath  lab on February 29 by Dr. Juanda Chance with placement of a stent to the LAD  through the LIMA, decreasing from 90 to 0% with use of bare-metal stent.  The patient tolerated the procedure without complications.  On day of  discharge,  Dr. Gala Romney in to see the patient.  Catheter site stable.  Vitals:  The patient with low grade temperature of 100, heart rate 80,  blood pressure 134/64, saturation 92% on room air.  H&H 14.2 and 40.9,  platelets 201,000.  WBCs 10.5, potassium 4.3, BUN and creatinine 25 and  1.3, glucose 176.  Post intervention, CK  total 553, CK-MB 97, index 1.8.  Patient being discharged home with instructions to follow up with Dr.  Juanda Chance and Dr. Oliver Barre, his primary care physician.  The patient  agrees to call our office in the morning to schedule both appointments,  Dr. Juanda Chance within 2 weeks, Dr. Jonny Ruiz within the next month.  Will  continue low-dose beta blocker that was initiated during  hospitalization, discontinue Norvasc.  The patient has been given the  post cardiac catheterization discharge instructions along with coronary  intravascular stent placement information.   He is to take the following medications:  1. Aspirin 325 mg daily.  2. Enalapril 10 mg b.i.d.  3. Isosorbide 30 mg daily.  4. Carvedilol 3.25 one mg daily.  This is a new medication for him.  5. K-Dur 40 mEq daily.  6. Plavix 75 mg daily.  7. Furosemide 40 mg p.o. b.i.d.  8. Lovastatin 40 mg daily.  9. Nitroglycerin as needed.   He  has also been given the post cardiac catheterization discharge  instructions.  Agrees to call our office for any problems from  catheterization site.  I have instructed him to stop his Norvasc  (amlodipine).   Duration of discharge encounter is 35 minutes.      Dorian Pod, ACNP      Bevelyn Buckles. Bensimhon, MD  Electronically Signed    MB/MEDQ  D:  03/31/2006  T:  03/31/2006  Job:  [id number]

## 2010-06-17 NOTE — Discharge Summary (Signed)
NAME:  [name], [name] NO.:  [id number]   MEDICAL RECORD NO.:  [id number]          PATIENT TYPE:  INP   LOCATION:  6527                         FACILITY:  [facility]   PHYSICIAN:  Charlies Constable, M.D. [facility] DATE OF BIRTH:  22-Jul-1945   DATE OF ADMISSION:  08/14/2004  DATE OF DISCHARGE:                                 DISCHARGE SUMMARY   CLINICAL HISTORY:  [name] is 65 years old and has had remote bypass  surgery and has had documented occlusion of the vein graft to the circumflex  artery and stenting of the right coronary artery.  He also has a DDD  pacemaker in place, diabetes, hypertension, hyperlipidemia.  He was admitted  with increasing symptoms over the past two weeks with a diagnosis of  unstable angina.  Initial medical therapy was tried since he was  catheterized two months ago but because of his recent acceleration of  symptoms we made a decision to proceed with catheterization.   PROCEDURE:  The procedure was performed via the right femoral artery using  arterial sheath and 6-French preformed coronary catheters.  A front wall  arterial puncture was performed and Omnipaque contrast was used.  After  completion of the diagnostic study we made a decision to proceed with  intervention on the native circumflex artery.   Patient was given Angiomax bolus and infusion and had already been on  Plavix.  We used a CLS4 6-French guiding catheter with side holes and a PT2  light support wire.  We crossed tandem lesions in the AV circumflex artery  with the wire without too much difficulty.  We pre dilated with a 2.25 x 20  mm Maverick performing three inflations up to 8 atmospheres for 30 seconds.  We then deployed a 2.5 x 20 mm TAXUS stent in the mid portion of the AV  circumflex artery covering two 95% tandem lesions.  We deployed this with  one inflation of 8 atmospheres for 30 seconds.  We then post dilated the  stent with a 2.5 x 15 mm Quantum Maverick performing  two inflations up to 15  atmospheres for 30 seconds.  We then treated the ostium of the AV circumflex  which was ostial to a ramus branch.  We treated this with the 2.5 x 15 mm  Quantum Maverick but this gave a suboptimal result so we went back in with a  2.75 x 10 mm cutting balloon, performed three inflations up to 3 atmospheres  for 30 seconds.  Repeat diagnostic study was then performed through the  guiding catheter.  Patient tolerated the procedure well and left the  laboratory in satisfactory condition.   RESULTS:  Left main coronary artery:  Free of significant disease.   Left anterior descending artery:  Completely occluded at its origin.   Circumflex artery:  Had a 70% ostial stenosis ostial to a ramus branch.  There then were tandem 95% stenoses in the mid portion of the vessel and  then the vessel terminated into two posterolateral branches.  There was a  70% lesion in one of the posterolateral branches.  Right coronary artery:  Moderately large vessel.  Gave rise to a conus  branch, a right ventricular branch, a posterior descending branch, and two  posterolateral branches.  There was 0% stenosis at the stent site in the  proximal right coronary artery.  There was 60% narrowing in the mid right  coronary artery and 40% narrowing in the distal right coronary artery.   The saphenous vein graft to the circumflex artery was completely occluded at  its origin (old).   The LIMA graft to the LAD was patent and functioned normally.  The distal  LAD was diffusely diseased and small vessel and there was 70% narrowing in  the mid portion.   LEFT VENTRICULOGRAM:  The left ventriculogram performed in the RAO  projection showed hypokinesis of inferobasilar wall.  The overall wall  motion was good with an estimated ejection fraction of 55%.   Following stenting of the tandem lesions in the mid right coronary artery  the stenoses improved from 95% and 95% to 0%.   Following cutting  balloon angioplasty of the ostial lesion of the circumflex  artery the stenosis improved from 70% to 40%.   CONCLUSIONS:  1.  Coronary artery disease status post prior coronary artery bypass graft      surgery and prior stenting of the proximal right coronary artery.  2.  Severe native vessel disease with total occlusion of left anterior      descending artery, 70% ostial stenosis of the circumflex artery with      tandem 95% stenoses in the mid circumflex artery, 0% stenosis at the      stent site in the proximal right coronary artery with 60% narrowing in      the mid right coronary artery.  3.  Occluded saphenous vein graft to the circumflex artery (old) and patent      left internal mammary artery graft to the left anterior descending with      70% narrowing in the mid vessel.  4.  Inferobasilar wall hypokinesis, but overall good left ventricular      function with an estimated ejection fraction of 55%.  5.  Successful PCI of tandem lesions in the mid circumflex artery using a      TAXUS drug-eluting stent with improvement in two 95% stenoses to 0%.  6.  Successful cutting balloon angioplasty of the ostial lesion of the      circumflex artery and improvement in center of narrowing percent      diameter narrowing from 70% to 40%.   DISPOSITION:  Patient will return to postanesthesia care unit for further  observation.       BB/MEDQ  D:  08/17/2004  T:  08/17/2004  Job:  [id number]   cc:   Corwin Levins, M.D. [facility]   CP Lab

## 2010-06-17 NOTE — H&P (Signed)
NAME:  [name], [name]                        ACCOUNT NO.:  [id number]   MEDICAL RECORD NO.:  [id number]                   PATIENT TYPE:  INP   LOCATION:  1831                                 FACILITY:  [facility]   PHYSICIAN:  [place] Bing, M.D.               DATE OF BIRTH:  02/14/1944   DATE OF ADMISSION:  12/16/2002  DATE OF DISCHARGE:                                HISTORY & PHYSICAL   REFERRING PHYSICIAN:  Corwin Levins, M.D.   PRIMARY CARDIOLOGIST:  Carole Binning, M.D.   HISTORY OF PRESENT ILLNESS:  A 65 year old gentleman who underwent CABG  surgery in 1991 and has had multiple percutaneous interventions since that  time including four within the past two years. Most recently, he was  hospitalized earlier this month with a non-Q myocardial infarction. A Cypher  stent was placed in the saphenous vein graft to the PDA. Left ventricular  systolic function was normal at that time.   Over the past two weeks, the patient has generally not felt well. He has had  dyspnea on mild exertion as well as easy fatigability. He suffered an  episode of choking on food earlier today. Approximately one half hour after  that resolved, he noted substernal pressure radiating to the left arm with  moderate severity. He took a number of nitroglycerin at home. Additional  tablets were administered by EMS. At the present time, he feels fatigued  with minimal residual chest soreness.   [name] has a history of hypertension, hyperlipidemia, and remote use of  tobacco products. He has no history of diabetes.   PAST MEDICAL HISTORY:  Notable otherwise for conduction system disease with  prior placement of a dual chamber pacemaker. [name] has previously  undergone orthopedic surgery for his left arm and right hand.   CURRENT MEDICATIONS:  1. Aspirin 325 mg q.d.  2. Clopidogrel 75 mg q.d.  3. Atorvastatin 40 mg q.d.  4. Ramipril 2.5 mg q.d.  5. Isosorbide mononitrate 30 mg q.d.  6.  Furosemide 20 mg q.d.  7. KCl 20 mEq q.d.  8. Metoprolol 50 mg q.d.  9. Sublingual nitroglycerin.   FAMILY HISTORY:  Father died as a result of a CVA. One of his sisters has  suffered an MI.   SOCIAL HISTORY:  Lives in [place] with his wife. Employed as a Engineer, maintenance. Fifty pack history of tobacco use that was discontinued 11 years  ago. Denies excessive use of alcohol.   REVIEW OF SYSTEMS:  Notable for intermittent orthopnea and PND. The patient  notes some generalized weakness including weakness of his legs and some  problems with balance. He has a history of arthralgias and cold intolerance.  All other systems are negative.   PHYSICAL EXAMINATION:  GENERAL:  Pleasant gentleman with somewhat flat  affect and no acute distress.  VITAL SIGNS:  Temperature is 98, heart rate is 60 and regular, respirations  16, blood pressure 80/60, O2 saturation 98% on room air.  HEENT:  Anicteric sclerae.  NECK:  No jugular venous distention. No carotid bruits.  ENDOCRINE:  No thyromegaly.  HEMATOPOIETIC:  No adenopathy.  SKIN:  No significant lesions.  CHEST:  Slightly increased AP diameter. Clear lung fields.  CARDIAC:  Normal first and second heart sounds; fourth heart sound present.  ABDOMEN:  Soft and nontender; decreased bowel sounds, aortic pulsation not  appreciated. No organomegaly.  NEUROMUSCULAR:  Symmetric strength and tone.  MUSCULOSKELETAL:  Full range of motion in all joints.   STUDIES:  EKG:  Atrial synchronous pacing at a rate of 60.   Other laboratory studies pending.   IMPRESSION:  [name] returns soon after saphenous vein graft stenting  with recurrent chest pain consistent with myocardial ischemia. His  electrocardiogram provides no information regarding possible myocardial  ischemia. Since he has had significant disease whenever he has undergone  coronary angiography in the past two years, this study will be repeated.  Initial treatment will be with his  usual medications plus intravenous  heparin and nitroglycerin. Serial cardiac markers will be obtained. The plan  was discussed with the patient and his wife who agreed to proceed with  coronary angiography, being fully aware of the potential risks and benefits  of this procedure.                                                [place] Bing, M.D.    RR/MEDQ  D:  12/16/2002  T:  12/16/2002  Job:  [id number]

## 2010-06-17 NOTE — Cardiovascular Report (Signed)
[name], PERUSSE NO.:  [id number]   MEDICAL RECORD NO.:  [id number]          PATIENT TYPE:  INP   LOCATION:  2006                         FACILITY:  [facility]   PHYSICIAN:  Charlies Constable, M.D. [facility] DATE OF BIRTH:  Apr 01, 1945   DATE OF PROCEDURE:  08/10/2005  DATE OF DISCHARGE:                              CARDIAC CATHETERIZATION   CLINICAL HISTORY:  [name] is 65 years old and had previous bypass  surgery and had a previous occlusion of the vein graft to circumflex system.  He has had previous stenting of the right coronary artery and the mid  circumflex artery.  We did a scan on him last April which suggested anterior  ischemia, and we did a cath which did not show any culprit lesion in the  distribution of the anterior wall, but he had about 70% ostial lesion in the  circumflex artery which we elected to treat medically.  Recently, he has  felt increased symptoms, and he was admitted through the office yesterday  with increasing symptoms of chest pain, consistent with unstable angina.   PROCEDURE:  The procedure was performed by the right femoral artery using  arterial sheath and 6-French preformed coronary catheters.  A femoral  arterial puncture was performed and Omnipaque contrast was used.  The right coronary catheter was used for injection of the LIMA graft.  The  right femoral was closed with AngioSeal at the end of the procedure.  The  patient tolerated the procedure well and left the laboratory satisfactory  condition.   RESULTS:  The left main coronary artery:  The left main coronary artery is  free of significant disease.   Left anterior descending artery:  The left anterior descending artery was  completely occluded at its origin.   The circumflex artery:  The circumflex artery gives rise to a ramus branch  and an AV branch which terminated into 2 posterolateral branches.  There is  0% stenosis of the stent site in the mid AV circumflex  artery.  There was  70% ostial stenosis in the circumflex artery right after the intermediate  branch.   The right coronary artery:  There is a moderate-sized vessel and gave rise  to a right ventricular branch, posterior descending branch and 2  posterolateral branches.  There were long overlapping stents in the proximal-  to-mid right coronary artery with a 50% focal stenosis in the midportion of  the stents.   The LIMA graft to LAD was patent and functioned normally.  There was 40%  narrowing in the mid LAD after the insertion site.   The saphenous vein graft to circumflex artery was occluded by prior study.   The left ventriculogram was performed in the RAO projection showed good wall  motion with no areas of hypokinesis.  The estimated fraction was 60%.   HEMODYNAMIC:  The aortic pressure is 111/75 with a mean of 91.  Left  ventricular  pressure is 111/20.   CONCLUSION:  1.  Coronary artery disease, status post previous coronary bypass graft      surgery and status post multiple  percutaneous coronary interventions.  2.  Severe native vessel disease with total occlusion of left anterior      descending artery,  70% ostial stenosis of the circumflex artery with 0%      stenosis at the stent site in the mid circumflex artery, a 50% focal      narrowing within the stent in the proximal-to-mid right coronary artery      and normal LV function.   RECOMMENDATIONS:  There is no clear culprit to explain the patient's recent  symptoms.  I am a little concerned about the ostial lesion in the circumflex  artery but this does not appear to have changed much from prior study, and I  am not convinced this is causing ischemia.  We will plan continued medical  therapy.  He did have a scan just 3 months ago which did not show ischemia  in the inferior distribution, and I am not sure the lesion has progressed  since that point.  If he has not been on Renexa may consider a trial of   that.           ______________________________  Charlies Constable, M.D. [facility]     BB/MEDQ  D:  08/10/2005  T:  08/10/2005  Job:  [id number]   cc:   Charlies Constable, M.D. [facility]  [address]  [address]  [place]  [place] [address]   Efrain Sella, MD

## 2010-06-17 NOTE — Assessment & Plan Note (Signed)
[facility]                            CARDIOLOGY OFFICE NOTE   [name], [name]                     MRN:          [id number]  DATE:02/08/2006                            DOB:          Aug 30, 1945    CLINICAL HISTORY:  [name] is 65 years old and has had prior bypass  surgery and multiple percutaneous interventions.  He has known total  occlusion of the graft to the circumflex artery.  He was last  catheterized last summer, and had mostly nonobstructive disease, but has  had persistent angina from small vessel disease.  He have placed him on  Ranexa, and he has had marked improvement with this.  He continues to do  fairly well on Ranexa.  He has had only minor recent chest pain and he  does get some shortness of breath with exertion.   PAST MEDICAL HISTORY:  Significant for a DDD pacemaker.  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  He also has hypertension and  hyperlipidemia.   CURRENT MEDICATIONS:  Ranexa, aspirin, Plavix, Enalapril, Norvasc,  Lasix, K-Dur, Proscar, Imdur, Zocor and Glyburide for diabetes.   EXAMINATION:  Blood pressure is 101/64.  Pulse 81 and regular.  There is no venous distention.  The carotid pulses were full, and there  were no bruits.  CHEST:  Clear without rales or rhonchi.  CARDIAC EXAM:  Regular.  He had no murmurs or gallops.  ABDOMEN:  Soft.  Normal bowel sounds.  There is no hepatosplenomegaly.  Straight peripheral edema and pedal pulses were equal.   We checked his pacemaker, and he was not pacer dependent.  He was atrial  tracking and ventricular pacing almost all of the time.  I did  thresholds on both leads.  The battery voltage was good.   IMPRESSION:  1. Coronary artery disease status post prior coronary bypass graft      surgery in 1992 with multiple percutaneous interventions with small      vessel disease at last catheterization.  2. Chronic stable angina, improved with Ranexa  therapy.  3. Good left ventricular function.  4. Status post DDD pacemaker implantation for sick sinus syndrome and      AV block with good pacer function.  5. Hypertension.  6. Hyperlipidemia.   RECOMMENDATIONS:  I think [name] is doing well.  I think the Ranexa  has made a big difference.  We will try and re-certify this so he can be  covered for this.  I will plan to see him back in followup in about 4  months.     Bruce Elvera Lennox Juanda Chance, MD, [facility]  Electronically Signed    BRB/MedQ  DD: 02/08/2006  DT: 02/08/2006  Job #: [id number]

## 2010-06-17 NOTE — Discharge Summary (Signed)
[name], [name]              ACCOUNT NO.:  [id number]   MEDICAL RECORD NO.:  [id number]          PATIENT TYPE:  INP   LOCATION:  A201                          FACILITY:  [facility]   PHYSICIAN:  Osvaldo Shipper, MD     DATE OF BIRTH:  [birth year]   DATE OF ADMISSION:  10/29/2005  DATE OF DISCHARGE:  10/02/2007LH                                 DISCHARGE SUMMARY   PRIMARY CARE PHYSICIAN:  Corwin Levins, MD, Primary cardiologist:  Everardo Beals.  Juanda Chance, MD from [facility].  Patient was seen by Dr. Dietrich Pates while he was  hospitalized.   DISCHARGE DIAGNOSES:  1. Dyspnea on exertion, etiology unclear.  2. History of significant coronary artery disease, stable.  3. Type 2 diabetes, stable.  4. Morbid obesity, stable.   HISTORY OF PRESENT ILLNESS:  Please refer H and P dictated by Dr. Margaretmary Dys for details regarding the patient's presenting illness.   HOSPITAL COURSE:  Briefly, this is a 65 year old Caucasian male who has  history of coronary artery disease status post CABG and also status post  multiple percutaneous coronary interventions.  His last catheterization was  in April of 2007, which essentially revealed stable findings and medical  management was recommended.   The patient presented with worsening dyspnea on exertion, some of the onset  was quite acute, according to the patient.  It was felt that this could be  an angina equivalent and, hence, he was admitted to the hospital for further  evaluation.  The patient pretty much ruled out for acute coronary syndrome,  though he did have elevation in his total CK and CK-MB's but his troponin's  were unremarkable.  His electrolytes were all fine.  His blood sugars were  mildly elevated.  His lipid profile was also within reasonable limits  considering his CAD.  CBC was unremarkable.  His ABG's showed a paO2 of 67,  saturation's of 94%.  His BNP was also unremarkable.  The patient was seen  by Dr. Dietrich Pates who recommended an  echocardiogram.  He also recommended a D-  dimer which was negative.  Echocardiogram showed normal left ventricular  size with left ventricular hypertrophy, abnormal septal motion consistent  with a paced rhythm, hypokinesis of the mid and distal septum with mildly  impaired overall left ventricular systolic function noted.  Apparent  dyskinesis was present in the very basilar portion of the inferior wall.  Ejection fraction was estimated at 45%.  No significant valvular  abnormalities were appreciated.  Considering an essentially unremarkable  echocardiogram, he recommended CT scan to rule out pulmonary embolism, which  the patient underwent today and indeed there was no PE that was noted.  Stable lung nodules were seen that were seen on a previous CT scan.   The patient also underwent lower extremity Doppler's which were negative for  DVT.  I think considering that no concrete cardiac etiology has been  determined for his symptoms, other etiologies need to be pursued.  These  include possible chronic obstructive pulmonary disease.  The patient  mentioned that he has about 50 to 75 pack year  history of smoking, although  he quit 30 years ago.  It is quite possible that the patient has some  residual bronchitic/emphysematous changes in lungs.  We recommended  pulmonary function testing be done as an outpatient.  BNP was also  unremarkable.   Dr. Dietrich Pates said if the CT scan was negative for PE that he would recommend  an outpatient stress test which will be arranged for the patient by his  cardiologist.  We have made an appointment for the patient to followup.   His other medical issues remained stable during this admission.  On the day  of discharge he was feeling quite well.  He was saturating more than 95% on  room air at rest and with exertion.  He and his wife seemed a little bit  anxious about going home, but we reassured them that work up for any life-  threatening illnesses have  been completed and that he needs to followup with  his cardiologist and his primary medical doctor.   DISCHARGE MEDICATIONS:  No changes have been made to any of his medications.  Please see the H and P for a complete list.   FOLLOWUP:  Followup with PFT's on November 08, 2005.  Appointment with Dr. Dietrich Pates on November 07, 2005.  The patient says he might  change this to Dr. Juanda Chance who is his primary cardiologist.  The patient will  likely need an outpatient stress test per Dr. Marvel Plan recommendations.   DIET:  Modified carbohydrate diet, low.   ACTIVITY:  No restrictions, however, the patient has been told to increase  activities slowly and he has also been told not to exert himself.   CONSULTATIONS:  From [place].   CLINICAL DATA:  Studies ordered include an echocardiogram, lower extremity  Doppler's and a CT scan, all discussed above.  He also had a chest x-ray  which showed low lung volumes without focal disease.   Total discharge time is about 35 minutes.      Osvaldo Shipper, MD  Electronically Signed     GK/MEDQ  D:  10/31/2005  T:  10/31/2005  Job:  [id number]   cc:   Everardo Beals. Juanda Chance, MD, [facility]  [address]  [place], [place] [address]   Corwin Levins, MD  [address]  [place]  [place] [address]   Gerrit Friends. Dietrich Pates, MD, [facility]  [address]  [place], [place] [address]

## 2010-06-17 NOTE — Discharge Summary (Signed)
NAMESHERIF, MILLSPAUGH              ACCOUNT NO.:  [id number]   MEDICAL RECORD NO.:  [id number]          PATIENT TYPE:  INP   LOCATION:  6532                         FACILITY:  [facility]   PHYSICIAN:  Charlies Constable, M.D. [facility] DATE OF BIRTH:  March 18, 1945   DATE OF ADMISSION:  10/31/2004  DATE OF DISCHARGE:  11/02/2004                                 DISCHARGE SUMMARY   PRINCIPAL DIAGNOSIS:  Chest pain/coronary artery disease.   OTHER DIAGNOSES:  1.  History of sick sinus syndrome status post DDD pacemaker.  2.  Type 2 diabetes mellitus.  3.  History of traumatic accident to the left arm.  4.  Hypertension.  5.  Hyperlipidemia.   ALLERGIES:  NO KNOWN DRUG ALLERGIES.   PROCEDURES:  Left heart cardiac catheterization.   HISTORY OF PRESENT ILLNESS:  A 65 year old white male with history of CAD  status post CABG by Dr. Andrey Campanile with LIMA to the LAD and whose is also status  post PCI and stenting of the right coronary and circumflex arteries.  His  last procedure was in July 2006 at which time the circumflex received a  Taxus drug-eluting stent.  On October 30, 2004 while at church, Mr. Pond  developed substernal chest discomfort, but eventually relieved, however,  recurred on the evening of October 31, 2004.  He presented to [facility] where he was treated with over 14 mg of  morphine and IV  nitroglycerin infusion at 60 mcg per minute.  He was transferred to [facility] for further evaluation.   HOSPITAL COURSE:  Following arrival he still had some chest discomfort.  His  ECG showed ventricular pacing and his cardiac enzymes were negative.  He  underwent left heart cardiac catheterization on November 02, 2004 which  revealed a patent LIMA to the LAD, a 70% lesion in the proximal circumflex  with a patent stent in the mid circumflex.  The RCA had a patent stent  proximally with 50-60% stenosis in the mid section of the artery.  The EF  was 60%.  Films were reviewed by Dr.  Juanda Chance and Dr. Samule Ohm and the decision  was made to add IMDUR 30 mg daily for the time being.  If the patient were  to have any recurrence of symptoms, PCI of the proximal circumflex would be  reconsidered.  Mr. Dahan has not had any recurrent chest discomfort and  has been ambulating post catheterization without limitations.  He is being  discharged to home today in satisfactory condition.   DISCHARGE LABS:  Hemoglobin 13.7, hematocrit 39.4, WBC 5.6, platelets 177,  MCV 87.2, sodium 136, potassium 3.9, chloride 104, CO2 26, BUN 18,  creatinine 1.1, glucose 118, CK 217, CK-MB 7.2, troponin I 0.01, calcium  8.4.   DISPOSITION:  The patient is being discharged home today in good condition.   FOLLOWUP PLANS/APPOINTMENTS:  He has a followup appointment with Dr.  Regino Schultze PA or nurse practitioner on November 09, 2004 at 9:45 a.m.  He is  asked to follow-up with his primary care physician Dr. Jonny Ruiz in 1-2 weeks.   DISCHARGE  MEDICATIONS:  1.  Aspirin 325 mg daily.  2.  Plavix 75 mg daily.  3.  Enalapril 10 mg b.i.d.  4.  Norvasc 10 mg daily.  5.  Actos 30 mg daily.  6.  Lasix 40 mg daily.  7.  Lovastatin 40 mg q.h.s.  8.  Potassium 20 mEq daily.  9.  Multivitamin one daily.  10. Nitroglycerin 0.4 mg sublingual p.r.n. chest pain.  11. IMDUR 30 mg daily (added on this admission).   Outstanding lab studies none.  Duration of discharge encounter 45 minutes  including physician time.      Ok Anis, NP    ______________________________  Charlies Constable, M.D. [facility]    CRB/MEDQ  D:  11/02/2004  T:  11/02/2004  Job:  [id number]   cc:   Jonny Ruiz, MD

## 2010-06-17 NOTE — Cardiovascular Report (Signed)
NAME:  [name], [name] NO.:  [id number]   MEDICAL RECORD NO.:  [id number]          PATIENT TYPE:  INP   LOCATION:  2905                         FACILITY:  [facility]   PHYSICIAN:  Charlies Constable, M.D. [facility] DATE OF BIRTH:  1945-02-17   DATE OF PROCEDURE:  05/05/2004  DATE OF DISCHARGE:                              CARDIAC CATHETERIZATION   CLINICAL HISTORY:  Mr. Higginbotham is 65 years old and has had prior bypass  surgery and has had prior intervention on the circumflex and saphenous vein  graft to the circumflex artery which subsequently has become completely  occluded.  He also has a DDD pacemaker in for AV block.  He was admitted on  April 5 with chest pain and a CK-MB of 13 consistent with a non ST elevation  myocardial infarction.  He was scheduled for angiography today.   PROCEDURE:  The procedure was performed via the right femoral artery using  arterial sheath and 6-French preformed coronary catheters.  A front wall  arterial puncture was performed and Omnipaque contrast was used.  A LIMA  catheter was used for injection of the LIMA graft.  The right femoral artery  was closed with AngioSeal at the end of the procedure.  The patient  tolerated the procedure well and left the laboratory in satisfactory  condition.   RESULTS:  The aortic pressure was 127/81 with a mean of 102 and left  ventricular pressure was 127/19.   Left main coronary artery was free of significant disease.   Left anterior descending artery was completely occluded in its origin.   Circumflex artery gave rise to a large early marginal branch and two small  posterolateral branches.  It appeared there was a second marginal branch  completely occluded in the mid portion.  The circumflex artery was diffusely  diseased after the first large marginal branch with multiple 80 and 90%  stenoses and only two small posterolateral branches filling distally.   The right coronary artery was a large dominant  vessel, gave rise to a right  ventricular branch, a posterior descending branch, and two posterolateral  branches.  There was a stent which was widely patent in the proximal right  coronary artery with less than 10% stenosis.  There was 50-60% stenosis just  after the stent in the mid vessel and a 40% narrowing in the distal vessel.   A LIMA graft to the LAD was patent and functioned normally.  This filled the  diagonal branch and the LAD.  The LAD was quite small in caliber and  diffusely diseased with 70% narrowing in its mid to distal portion.   The saphenous vein graft to the circumflex marginal vessel was completely  occluded by a prior study.   LEFT VENTRICULOGRAM:  The left ventriculogram performed in the RAO  projection showed hypokinesis of the inferobasal wall.  The overall wall  motion was good with an estimated ejection fraction of 55%.   CONCLUSIONS:  1.  Coronary artery disease status post remote coronary artery bypass      surgery.  2.  Severe native vessel disease with  total occlusion left anterior      descending artery, multiple 80 and 90% stenoses in the distal circumflex      artery, 50 and 60%  narrowing in the mid right coronary artery, and 40%      narrowing in the distal right coronary artery.  3.  Patent left internal mammary artery graft to the left anterior      descending with small distal left anterior descending vessel with 70%      narrowing in its mid to distal portion and an occluded vein graft to the      circumflex artery by prior study.  4.  Inferobasal wall hypokinesis with an estimated ejection fraction of 55%.   RECOMMENDATIONS:  I suspect the culprit for his recent non ST elevation  myocardial infarction is probably the distal circumflex artery.  This vessel  is diffusely diseased and there is only two small posterolateral branches  with not very much runoff.  I think medical therapy is the best option.      BB/MEDQ  D:  05/05/2004  T:   05/05/2004  Job:  [id number]   cc:   Corwin Levins, M.D. [facility]   CP Lab

## 2010-06-17 NOTE — Discharge Summary (Signed)
[place]  Patient:    [name], [name] Visit Number: [id number] MRN: [id number]          Service Type: MED Location: [phone] Attending Physician:  [name] Dictated by:   [name], P.A. [facility] Admit Date:  11/28/2000 Discharge Date: 11/30/2000                    Referring Physician Discharge [place]  DISCHARGE DIAGNOSES: 1. Coronary artery disease. 2. Progressive exertional dyspnea, resolved. 3. Hypertension. 4. Dislipidemia. 5. Sinus bradycardia, resolved.  HISTORY OF PRESENT ILLNESS:  [name] is a 65 year old male patient of Dr. Loraine Leriche Collier with known coronary artery disease.  He underwent CABG by Dr. Particia Collier February 1992 with the following grafts:  LIMA to the LAD, saphenous vein graft to the circumflex.  His past medical history included hypertension, hyperlipoproteinemia, and in May 2002 he developed recurrent chest discomfort and ruled in for a subendocardial myocardial infarction.  At that time, he underwent thinning of the circumflex marginal from a 99% to a 9% lesion without complication.  Since that time he has noted progressive dyspnea on exertion, and in follow-up Dr. Gerri Collier obtained a Cardiolite which revealed inferior wall ischemia; EF 57%.  HOSPITAL COURSE:  [name] was admitted to [facility] on November 28, 2000 for cardiac catheterization with possible coronary intervention.  He was found to have normal LV systolic function, left main normal, LAD occluded at its origin.  The left circumflex has a 95% followed by a 100% occlusion to the mid vessel.  There is a stent in the proximal portion of his obtuse marginal (first) which has a 75% in-stent restenosis.  Right coronary artery is dominant with a focal 75% stenosis in the proximal vessel, mid vessel diffuse 30% stenosis, distal vessel 30% stenosis just proximal to the PDA.  At that point, Dr. Gerri Collier proceeded with percutaneous  intervention of the obtuse marginal and the right coronary artery.  A successful PTCA of the first obtuse marginal branch occurred with a lesion 75% reduced to a less than 10% residual.  The patient also underwent a successful PTCA with stent placement in the proximal RCA, reducing a 75% stenosis to a 0% residual.  The patient remained stable overnight and was prepared for discharge to home the following morning.  LABORATORY DATA:  Lab studies during his stay revealed a potassium 4.2, BUN 12, creatinine 1.0.  Hemoglobin 14.4, hematocrit 42.6.  DISPOSITION:  The patient was discharged to home on the following medications.  MEDICATIONS: 1. Enteric-coated aspirin 325 mg one p.o. q.d. 2. Plavix 75 mg one p.o. q.d. for four weeks. 3. Niaspan 500 mg one p.o. q.h.s. 4. Altace 5 mg one p.o. q.d. 5. Nitroglycerin p.r.n. chest pain.  He is to discontinue Toprol-XL  The patient did develop sinus bradycardia during the final 24 hours of his admission with a type 1 second degree AV block.  Toprol-XL was stopped and the patient stabilized.  ACTIVITY:  Upon discharge, the patient is to not participate in strenuous activity for two days, then he may progress his activity.  DIET:  Remain on a low fat diet.  WOUND CARE:  Clean over catheterization site with soap and water.  Call for any questions or concerns.  FOLLOW-UP:  He has an appointment to follow up with [name], P.A. on November 8 at 11:15 a.m. and on December 5 at 10:15 a.m. with Dr. Gerri Collier. Dictated by:   [name], P.A.  [facility] Attending Physician:  [name] DD:  11/30/00 TD:  11/30/00 Job: [id number]

## 2010-06-17 NOTE — H&P (Signed)
NAMEALANSON, [name]              ACCOUNT NO.:  [id number]   MEDICAL RECORD NO.:  [id number]          PATIENT TYPE:  INP   LOCATION:  2009                         FACILITY:  [facility]   PHYSICIAN:  Madolyn Frieze. Jens Som, MD, FACCDATE OF BIRTH:  1945/08/01   DATE OF ADMISSION:  03/28/2006  DATE OF DISCHARGE:                              HISTORY & PHYSICAL   PRIMARY CARDIOLOGIST:  Everardo Beals. Juanda Chance, MD, [facility].   PRIMARY CARE PHYSICIAN:  Corwin Levins, MD.   PATIENT PROFILE:  A 65 year old Caucasian male with a prior history of  coronary artery disease status post CABG along with multiple  interventions who presents with acute congestive heart failure.   PROBLEMS:  1. Acute presumably systolic congestive heart failure.  2. Mild ischemic cardiomyopathy.      a.     October 2007 ejection fraction 45% with left ventricular       hypertrophy by 2-dimensional echocardiogram.  3. Coronary artery disease with a history of multiple myocardial      infarctions.      a.     Status post coronary artery bypass grafting x2 in 1992, with       a left internal mammary artery to the left anterior descending and       vein graft to the left posterolateral branch of the left       circumflex.      b.     May 2002, bare metal stenting of the obtuse marginal-2.      c.     October 2002, bare metal stenting of the right coronary       artery and cutting balloon angioplasty of the obtuse marginal-2.      d.     June 2003, drug-eluting stent placement secondary to in-       stent restenosis in the right coronary artery as well as       percutaneous transluminal coronary intervention of the ramus       intermedius.      e.     December 2003, bare metal stenting of the left internal       mammary artery to the left anterior descending.      f.     December 01, 2002, drug-eluting stent placement in the vein       graft to the left posterolateral.      g.     December 17, 2002, drug-eluting stent placement to the  vein       graft to the left posterolateral.      h.     August 10, 2005, cardiac catheterization:  left main normal,       left anterior descending occluded at the origin.      i.     Left circumflex 70% after the intermediate branch, right       coronary artery, moderate sized vessel with a 50% focal stenosis       in the mid portion of the two previously placed stents.  Left       internal mammary artery to the left anterior  descending was       patent, and there was a 40% narrowing in the mid-left anterior       descending after the insertion site.  Vein graft to the circumflex       was occluded by prior study.  Ejection fraction was 60%.  4. Hypertension.  5. Hyperlipidemia.  6. Type 2 diabetes mellitus.  7. Obesity.  8. 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  9. Questionable history of prostate cancer or benign prostatic      hypertrophy previously on Proscar.  10.History of thrombocytopenia.   HISTORY OF PRESENT ILLNESS:  A 65 year old Caucasian male with a history  of CAD and CABG along with multiple PCI's as well as a mild ischemic  cardiomyopathy with an EF of 45% by echocardiogram in October 2007.  He  has a long history of chest pain that has increased in frequency over  the past two months usually occurring about once a week approximately  one hour after completing his daily walk lasting about one hour and  resolving with sublingual nitroglycerin.  On Sunday, he felt worsening  dyspnea on exertion and thought he was coming down with a cold.  Since  then, he has had continued/progressive worsening dyspnea on exertion as  well as increasing abdominal girth, PND, orthopnea, slight increase in  baseline lower extremity edema, and weight gain of approximately 10  pounds in the past three days.  He has been doubling his Lasix dose  since Monday without improvement and presented to the ED today for  further evaluation.  He is  currently pain free and while sitting more or  less at 90 degrees he is free of dyspnea.   ALLERGIES:  No known drug allergies.   HOME MEDICATIONS:  1. Imdur 30 mg daily.  2. Glimepiride 4 mg daily.  3. Lasix 40 mg daily.  4. K-Dur 20 mEq daily.  5. Vasotec 10 mg b.i.d.  6. Lovastatin 40 mg daily.  7. Norvasc 10 mg daily.  8. Allegra 180 mg daily.  9. Hydrocodone -acetaminophen p.r.n.  10.Aspirin 325 mg daily.  11.Plavix 75 mg daily.  12.Multivitamin 1 daily.   FAMILY HISTORY:  Mother died of pneumonia at age 89.  Father died at age  63.  The patient said he was in a coma but does not know what happened  from there.  He had two sisters who died of CAD and one sister who died  of cancer.   SOCIAL HISTORY:  Lives in [place] with his wife.  He is a disabled  Curator.  He smoked approximately 15 years between one and three packs  a day and quit about 35 years ago.  He denies any alcohol or drug use.  He walks the track on a daily basis and tries to adhere to a strict  diabetic diet.   REVIEW OF SYSTEMS:  Positive for chest pain, shortness of breath,  dyspnea on exertion, two-pillow orthopnea at baseline worse over the  past couple of days, PND, lower extremity edema, and diabetes.  All  other systems reviewed and negative.   PHYSICAL EXAMINATION:  Temperature 97, heart rate 86, respirations 22,  blood pressure 102/68, pulse oximetry 95% on room air.  A pleasant white male in no acute distress.  Awake, alert, and oriented  x3.  NECK:  Obese with just mildly elevated JVP and no HJR.  LUNGS:  He has a few  crackles in the bases but otherwise clear to  auscultation.  CARDIAC:  He has a very distant S1 and S2, no murmurs.  ABDOMEN:  Obese, semi firm, bowel sounds present x4.  He is nontender.  EXTREMITIES:  He has got trace bilateral lower extremity edema.  Dorsalis pedis, posterior tibial pulses 1+ and equal bilaterally.  Chest x-ray is pending.  EKG shows paced rhythm with  a normal axis and a  rate of 84 beats per minute.  Lab work is pending.   ASSESSMENT AND PLAN:  1. Acute presumably systolic congestive heart failure/ischemic      cardiomyopathy.  Left ejection fraction back in October 2007 was      45%.  Plan to admit and cycle cardiac enzymes and add intravenous      Lasix.  Will check a BMP and echocardiogram.  Continue angiotensin-      converting enzyme and will plan to add beta blocker instead of his      Norvasc.  If ejection fraction is down further and/or cardiac      enzymes are abnormal, plan probable catheterization, otherwise plan      on outpatient Myoview to rule out ischemia.  2. Coronary artery disease, chronic and intermittent chest pain which      has been evaluated with multiple catheterizations over the years.      Interestingly, he does not have any exertional chest pains, but      then about an hour after he exerts himself, he will have some chest      discomfort that he describes as a shovel up against his chest, and      this is associated with shortness of breath lasting about an hour      or so even after taking nitroglycerin, so it is not entirely clear      if the nitroglycerin has helped.  Continue his aspirin, Plavix,      angiotensin-converting enzymes, and statin and also add beta      blocker.  Will cycle cardiac enzymes.  3. Hypertension, stable.  4. Hyperlipidemia.  Check lipids and LFTs.  Continue statins.  5. Type 2 diabetes mellitus.  Continue home medications and will add      sliding-scale insulin.      Nicolasa Ducking, ANP      Madolyn Frieze. Jens Som, MD, [facility]  Electronically Signed    CB/MEDQ  D:  03/28/2006  T:  03/28/2006  Job:  [id number]

## 2010-06-17 NOTE — H&P (Signed)
NAMEGILFORD, [name] NO.:  [id number]   MEDICAL RECORD NO.:  [id number]          PATIENT TYPE:  INP   LOCATION:  1828                         FACILITY:  [facility]   PHYSICIAN:  Charlton Haws, M.D.     DATE OF BIRTH:  05-30-1945   DATE OF ADMISSION:  05/04/2004  DATE OF DISCHARGE:                                HISTORY & PHYSICAL   REASON FOR ADMISSION:  Admission for unstable angina, subendocardial MI.   HISTORY OF PRESENT ILLNESS:  [name] is a 65 year old patient with an  extensive past cardiac history, a coronary artery bypass grafting in '92  with vein graft to the OM and LIMA to the LAD.  He has had multiple caths  since then.  He has had stenting of the vein graft to the OM with  reocclusion and unsuccessful attempt at recannulization in 2004.  He has had  a stent placed to the mid right coronary artery.  He has known occlusion of  the second OM with distal circumflex disease and distal LAD disease.  His  last cath in September of '05 showed residual 60% RCA disease, small vessel  distal disease in his circumflex and LAD; medical therapy was warranted.  The patient apparently had a Cardiolite study at the end of last year and  was treated medically.  He gets chest pain daily; however, tonight, it was  particularly bad; he was somewhat diaphoretic per his wife.  He was treated  with morphine and IV nitroglycerin in the ER and is currently comfortable.  He has a positive MB by cardiac markers.   The patient is also on the ACUITY Study drug.   PAST MEDICAL HISTORY:  His past medical history is otherwise remarkable for  a dual-chamber pacemaker placed about 2 years ago.  He was previously a diet-  controlled diabetic, but has now been placed on Actos; Dr. Jonny Ruiz recently  increased this to 30 mg a day 3 weeks ago.   The patient also has hypertension and hyperlipidemia, treated with Lipitor.  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  He has been on disability.   SOCIAL HISTORY:  He is happily married for 35 years.  He is on disability,  but does get around the house quite a bit and continues to drive.   He is an ex-smoker many years ago.   ALLERGIES:  He has no known drug allergies.   MEDICATIONS:  His medications include:  1.  Norvasc 5 mg a day.  2.  Imdur 90 mg a day.  3.  Lipitor 80 mg a day.  4.  Plavix 75 mg a day.  5.  An aspirin a day.  6.  Altace 2.5 mg a day.  7.  Toprol 50 mg a day.  8.  Lasix 40 mg a day.  9.  Potassium 20 mEq a day.  10. ACUITY Study drug.  11. Actos 30 mg a day.   PHYSICAL  EXAMINATION:  VITAL SIGNS:  On exam, blood pressure is 128/80.  He  is paced at a rate of 70.  LUNGS:  Clear.  NECK:  Carotids are normal.  CARDIAC:  There is an S1 and S2 with distant heart sounds.  ABDOMEN:  Benign.  EXTREMITIES:  Distal pulses are intact with no edema.  He has a large scar  in his left upper extremity from his crush injury; however, he has good  pulses.  He has a small area of decreased sensation in the palm and dorsum  of his hand.   LABORATORY AND ACCESSORY CLINICAL DATA:  His EKG shows ventricular pacing.   His labs are remarkable for a positive MB.  Creatinine is 1.1.  His  potassium is 3.1.  Platelets are 162,000.  Troponin is negative.  Hematocrit  is 40.  The rest of his labs and chest x-ray are pending.   IMPRESSION:  Unstable angina and subendocardial myocardial infarction in a  patient with known coronary disease.  He will need repeat heart  catheterization in the morning.  We will keep him on intravenous  nitroglycerin.  He is on Plavix and the ACUITY Study drug.  His heart rate  is rate-limited by his pacemaker.   The patient well understands the risks of cardiac catheterization and is  willing to proceed.   We will give him half his normal dose of Actos in the morning and  give him a  full liquid breakfast.  We will follow his blood sugars every 12 hours and  cover him with sliding-scale insulin.   We will continue his Altace, Norvasc and Toprol for blood pressure control.   In regards to his hyperlipidemia, he is already on Lipitor 80 mg a day and  we will check office records to see how this has affected his LDL  cholesterol.   The patient's pacemaker seems to be functioning normally and he has had no  tachyarrhythmias.   Further recommendations will be based on the results of his heart  catheterization.      PN/MEDQ  D:  05/04/2004  T:  05/04/2004  Job:  [id number]

## 2010-06-17 NOTE — Assessment & Plan Note (Signed)
[facility]                            CARDIOLOGY OFFICE NOTE   [name], [name]                     MRN:          [id number]  DATE:04/23/2006                            DOB:          19-Oct-1945    PRIMARY CARE PHYSICIAN:  Corwin Levins, M.D.   CLINICAL HISTORY:  [name] is 65 years old.  Remote bypass surgery  and multiple percutaneous interventions.  He was admitted to the  hospital this time with congestive heart failure, chest pain, and  positive MB's, consistent with a non-ST elevation infarction in  February.  He was diuresed and underwent angiography and was found to  have a tight lesion in the mid LAD just distal to a previously placed  bare metal stent in the mid LAD after the anastomosis of the LIMA graft.  We used another bare metal stent and got a good result.   He has done well since that time and had no recurrent chest pain.  He  still has some shortness of breath.   His past medical history is significant for problems outlined below.   His medications include aspirin, Plavix, enalapril, Lasix, K-Dur, Imdur,  Zocor, a study drug, and Coreg.   PHYSICAL EXAMINATION:  VITAL SIGNS:  Blood pressure is 113/75.  The  pulse is 69 and regular.  NECK:  There was no venous distention.  The carotid pulses were full  without bruits.  CHEST:  Clear.  CARDIAC:  Rhythm was regular.  There were no murmurs or gallops.  ABDOMEN:  Protuberant.  The bowel sounds were normal, and the abdomen  was soft.  There was no hepatosplenomegaly.  EXTREMITIES:  Pedal pulses were equal.  There was trace peripheral  edema.   IMPRESSION:  1. Coronary artery disease, status post coronary artery bypass graft      surgery in 1992, status post multiple prior percutaneous      interventions, status post recent stenting of the left anterior      descending artery through the left internal mammary artery graft      just distal to a previous bare metal stent  with a new bare metal      stent.  2. Good left ventricular function.  3. Status post DDD pacemaker for sick sinus syndrome and      atrioventricular block.  4. Hypertension.  5. Hyperlipidemia.  6. Study drug, ABC.   RECOMMENDATIONS:  I think [name] is doing well.  We will not make  any changes in his medications.  I will plan to see him back in followup  in three months.     Bruce Elvera Lennox Juanda Chance, MD, [facility]  Electronically Signed    BRB/MedQ  DD: 04/23/2006  DT: 04/23/2006  Job #: [id number]

## 2010-06-17 NOTE — Cardiovascular Report (Signed)
[place]. [facility]  Patient:    [name], [name] Visit Number: [id number] MRN: [id number]          Service Type: MED Location: 2000 2006 01 Attending Physician:  Daisey Must Dictated by:   Everardo Beals Juanda Chance, M.D. [facility] Proc. Date: 07/26/01 Admit Date:  07/26/2001   CC:         Corwin Levins, M.D. [facility]  Daisey Must, M.D. [facility]  Cardiac Catheterization Lab   Cardiac Catheterization  CLINICAL HISTORY:  [name] is 65 years old and had bypass surgery in 2002. In May 2002, he had stenting of his circumflex intermedius vessel with a 2.25 x 13-mm Pixel.  Subsequently in October 2002, he had cutting balloon angioplasty for in-stent restenosis in the intermedius and stenting of the proximal right coronary artery with 3.0 x 12-mm Express 2 stent by Dr. Gerri Spore.  He was admitted today with unstable angina with negative markers to date.  DESCRIPTION OF PROCEDURE:  The procedure was performed via the right femoral artery using an arterial sheath and #6 [place] preformed coronary catheters.  A front wall arterial puncture was performed and Omnipaque contrast was used. At the completion of the diagnostic study, we made a decision to proceed with intervention for in-stent restenosis in the proximal right coronary artery and in-stent restenosis in the intermedius branch of the circumflex artery.  The patient was given weight-adjusted heparin to prolong an ACT of greater than 200 seconds and was given double bolus Integrilin and infusion.  We approached the right coronary artery first.  We used a #6 [place] JR4 guiding catheter with sideholes and a short luge wire.  We crossed the lesion with the wire without difficulty.  We direct-stented with a 3.0 x 28-mm Cypher stent and deployed this with two inflations of 14 and 15 atmospheres for 24 and 25 seconds.  We then post dilated with a 3.25 x 20-mm Quantum Maverick performing two inflations of 16 and 14  atmospheres for 30 and 27 seconds.  We then approached the circumflex intermedius vessel.  We used a CLS3.5 guiding catheter and the same short luge wire.  We crossed the lesion with the wire without difficulty.  We used a 2.5 x 10-mm cutting balloon and performed a total of 4 inflations up to 10 atmospheres for 46 seconds within and just proximal to the previous stent.  Repeat diagnostic studies were then performed through the guiding catheter.  The patient tolerated the procedure well and left the laboratory in satisfactory condition.  RESULTS: 1. Left main coronary artery:  The left main coronary artery was free of    significant disease.  2. Left anterior descending artery:  The left anterior descending artery was    completely occluded at its origin.  3. Left circumflex coronary artery:  The left circumflex coronary artery gave    rise to an intermedius branch and a small AV branch.  There was a    posterolateral branch which was completely occluded.  There was a 90%    stenosis within the stent and the proximal portion of the marginal branch.  4. Right coronary artery:  The right coronary artery was a large dominant    vessel that gave rise to a conus branch, a right ventricular branch, a    posterior descending branch, and two posterolateral branches.  There was    90% stenosis within the stent and moderate stenosis just beyond the stent    in the proximal right coronary  artery.  5. The saphenous vein graft to the posterolateral branch of the circumflex    artery was patent.  There were 30% stenoses in the mid and distal portion    of the graft.  6. The LIMA graft to the LAD was patent.  There was 90% stenosis in the LAD    just after the graft insertion site.  This was 80% on the previous study.  LEFT VENTRICULOGRAPHY:  Left ventriculogram performed in the RAO projection showed good wall motion with no areas of hypokinesis.  The estimated ejection fraction was  60%.  Following stenting of the in-stent restenosis in the proximal right coronary artery, the stenosis improved from 90% to less than 10%.  Following cutting balloon angioplasty of the intermedius branch of the circumflex artery, the stenosis improved from 90% to 10%.  CONCLUSIONS: 1. Coronary artery disease status post coronary artery bypass graft surgery in    1992. 2. Severe native vessel disease with total occlusion of the left anterior    descending artery, 90% in-stent restenosis in the intermedius branch of the    circumflex artery with 90% stenosis in the mid circumflex artery and total    occlusion of the posterolateral branch of the circumflex artery, a 90%    stenosis within the stent in the proximal right coronary artery. 3. Patent vein graft to the posterolateral branch of the circumflex artery    with 30% mid and distal stenosis and a patent left internal mammary artery    graft to the left anterior descending artery with 90% stenosis in the left    anterior descending artery distal to the graft insertion site. 4. Good left ventricular function. 5. Successful stenting of the in-stent restenotic lesion in the proximal right    coronary artery with a Cypher stent with improvement in percent diameter    narrowing from 90% to less than 10%. 6. Successful cutting balloon angioplasty for in-stent restenosis in the    circumflex intermedius vessel with improvement in percent diameter    narrowing from 90% to 10%.  DISPOSITION:  The patient was returned to the post angioplasty unit for further observation.  Will plan to evaluate the patient as an outpatient regarding the lesion in the LAD after insertion of the LIMA graft; however, this has not changed too much from the previous study, and this would be a very difficult intervention due to marked tortuosity in the LIMA graft. Dictated by:   Everardo Beals Juanda Chance, M.D. [facility]  Attending Physician:  Daisey Must DD:  07/26/01 TD:   07/28/01 Job: [id number]

## 2010-06-17 NOTE — Cardiovascular Report (Signed)
[place]. [facility]  Patient:    [name], [name] Visit Number: [id number] MRN: [id number]          Service Type: CAT Location: 3700 3712 01 Attending Physician:  Daisey Must Dictated by:   Daisey Must, M.D. [facility] Proc. Date: 11/28/00 Admit Date:  11/28/2000   CC:         Cardiac Catheterization Lab   Cardiac Catheterization  PROCEDURE: 1. Left heart catheterization with coronary angiography, bypass graft    angiography, and left ventriculography. 2. Percutaneous transluminal coronary angioplasty utilizing a cutting    balloon in the obtuse marginal branch. 3. Percutaneous transluminal coronary angioplasty with stent placement in    the proximal right coronary artery.  CARDIOLOGIST:  Daisey Must, M.D. [facility]  INDICATION:  Mr. Waggle is a 65 year old male with history of previous coronary artery bypass surgery.  In May of this year, he experienced a non-Q-wave myocardial infarction and was treated with a stent in the obtuse marginal branch.  He more recently has been having exertional dyspnea.  A stress Cardiolite scan showed inferior ischemia.  We, therefore, proceeded with cardiac catheterization.  CATHETERIZATION PROCEDURAL NOTE:  A 6-French sheath was placed in the right femoral artery.  Standard Judkins 6-French catheters were utilized.  Contrast was Omnipaque.  There were no complications.  CATHETERIZATION RESULTS:  HEMODYNAMICS:  Left ventricular pressure 106/18, aortic pressure 106/72. There was no aortic valve gradient.  LEFT VENTRICULOGRAM:  Wall motion is normal.  Ejection fraction calculated at 66%.  There is trace mitral regurgitation.  CORONARY ANGIOGRAPHY: (Right dominant).  Left main is normal.  Left anterior descending artery is 100% occluded at its origin.  The left circumflex has a 95 followed by a 100% occlusion of the mid vessel. Proximal to the occlusion, the circumflex gives rise to a large branching first  obtuse marginal.  There is a stent in the proximal portion of this obtuse marginal which has a 75% in-stent restenosis.  The right coronary artery is a dominant vessel.  There is a focal 75% stenosis in the proximal vessel.  The mid vessel has a diffuse 30% stenosis, and the distal vessel has a 30% stenosis just proximal to the posterior descending artery.  The posterior descending artery is large.  The distal right coronary artery also gives rise to three small posterolateral branches.  IMPRESSIONS: 1. Normal left ventricular systolic function. 2. Native three-vessel coronary artery disease. 3. Status post coronary artery bypass grafting x 2 with both grafts being    patent. 4. Significant in-stent restenosis in the first obtuse marginal branch. 5. Significant stenosis in the proximal right coronary artery.  PLAN:  Percutaneous intervention of the obtuse marginal and the right coronary artery.  See below.  PTCA PROCEDURAL NOTE:  Following completion of the diagnostic catheterization, we opted to proceed with coronary intervention.  The 6-French in the right femoral artery was exchanged over wire for a 7-French sheath.  Heparin and Integrilin were administered per protocol.  We initially treated the obtuse marginal branch.  We used a 7-French Voda left #4 guiding catheter and BMW wire which was advanced down the main obtuse marginal and into the more lateral branch.  We performed PTCA of the in-stent restenosis with a 2.5 x 10 mm cutting balloon with three inflations, the first to 6 and the subsequent two to 8 atmospheres each.  Following this, there was significant improvement in the vessel lumen within the stent; however, the medial branch arising just beyond the stent  was compromised.  We, therefore, advanced a high-torque floppy wire down the side branch and dilated it was a 2.25 x 15 mm CrossSail balloon.  Angiographic images following this revealed an excellent result  with patency of the obtuse marginal as well as the side branches.  There was less than 10% residual stenosis within the previously placed stent, and patency of the medial branch was maintained with 20% residual stenosis.  We then turned our attention to the right coronary artery.  We used a 7-French JR4 guiding catheter with side holes and a BMW wire.  The lesion was directly stented with a 3.0 x 12 mm Express stent inflated to 14 atmospheres.  We then post dilated the proximal edge of the stent with a 3.5 x 8 mm Quantum balloon inflated to 20 atmospheres.  Final angiographic images revealed patency of the right coronary artery with 0% residual stenosis and TIMI-3 flow.  COMPLICATIONS:  None.  RESULTS: 1. Successful PTCA utilizing cutting of the first obtuse marginal branch.    A 75% in-stent restenosis was reduced to less than 10% residual with    TIMI-3 flow. 2. Successful PTCA with stent placement in the proximal right coronary artery    reducing a 75% stenosis to 0% residual with TIMI-3 flow.  PLAN:  Integrilin will be continued for 18 hours.  Plavix will be administered for four weeks. Dictated by:   Daisey Must, M.D. [facility] Attending Physician:  Daisey Must DD:  11/28/00 TD:  11/28/00 Job: [id number]

## 2010-06-17 NOTE — Discharge Summary (Signed)
NAME:  [name], [name]                        ACCOUNT NO.:  [id number]   MEDICAL RECORD NO.:  [id number]                   PATIENT TYPE:  INP   LOCATION:  2027                                 FACILITY:  [facility]   PHYSICIAN:  Olga Millers, M.D. [facility]            DATE OF BIRTH:  02/14/1944   DATE OF ADMISSION:  10/06/2003  DATE OF DISCHARGE:  10/08/2003                                 DISCHARGE SUMMARY   PROCEDURES:  1.  Cardiac catheterization.  2.  Coronary arteriogram.  3.  Left ventriculogram.  4.  Left internal mammary artery arteriogram.   HOSPITAL COURSE:  [name] is a 65 year old male with known coronary  artery disease.  He had progressive dyspnea and chest pain over the several  days prior to admission.  He had recurrent pain throughout the day of  admission and came to the emergency room, where he was admitted for further  evaluation and treatment.   [name] were elevated with a peak of 436/12.2, but troponin I's  were all less than 0.05.  to further evaluate him, it was felt that cardiac  catheterization was indicated and this was performed on October 07, 2003.   The cardiac catheterization showed a patent LIMA to LAD.  The LAD was  totalled prior to the graft anastomosis, but there was good filling of the  diagonal from the graft.  There was an 80% stenosis in the distal LAD, but  this was a small vessel, approximately 1 mm.  The circumflex has a 90%  distal stenosis that is old.  The OM-2 is totalled, which is also an old  lesion.  The OM-1 is patent.  The RCA was patent with 60% in-stent  restenosis in the midportion of the previously-placed stent.  His EF was  approximately 60%.  Dr. Juanda Chance evaluated the films and felt that even though  the lesion in the mid-LAD was new, it was not favorable for percutaneous  intervention; therefore, medical therapy was recommended.  He had been on  Imdur 30 mg daily prior to admission, and this was increased to 60  mg.  He  is to have an outpatient Cardiolite and follow up with Dr. Juanda Chance.   [name] had been Angiosealed, but he had some hemorrhage at the site.  The Angioseal dressing was removed and direct pressure was applied.  A wedge  dressing was placed.   The next day he had no hematoma, no ecchymosis at the catheterization site  and no bruit.  He had a lipid profile checked that showed a total  cholesterol of 106, triglycerides, 96, HDL 33, LDL 54.  He had been getting  CBGs during the course of his hospital stay, and some of them were greater  than 200.  Sliding scale insulin was used.  The patient stated that his  blood sugars were generally running less than 120 at home.  He checks them,  he states, twice a day, before breakfast and before supper.  He was advised  to check his blood sugar at other times of the day, possibly in the  afternoon or at bedtime. He is to continue to record these and follow up  with Dr. Jonny Ruiz.   [name] was ambulating without chest pain or shortness of breath and  considered stable for discharge on October 08, 2003.   CONDITION ON DISCHARGE:  Stable.   DISCHARGE DIAGNOSES:  1.  Chest pain, anginal pain, medical therapy recommended.  2.  Status post coronary bypass surgery in 1992 with left internal mammary      artery to left anterior descending coronary artery and saphenous vein      graft to obtuse marginal.  3.  Status post cardiac catheterization this admission showing 60% in-stent      restenosis in the right coronary artery, 90% circumflex, and 80% distal      left anterior descending coronary artery, with preserved left      ventricular function, patent left internal mammary artery to left      anterior descending coronary artery.  4.  History of sick sinus syndrome and atrioventricular block, status post      Medtronic dual-lead pacemaker.  5.  Hypertension.  6.  Dyslipidemia.  7.  Diet-controlled diabetes mellitus.  8.  Left upper  extremity surgery.  9.  Right hand surgery.  10. History of nephrolithiasis.  11. History of myocardial infarction in 2004 with unsuccessful percutaneous      coronary intervention of the saphenous vein graft to obtuse marginal.  12. Status post stent to the right coronary artery in 2004.   DISCHARGE INSTRUCTIONS:  His activity level is to include no strenuous  activity until seen by M.D.  He is to call the office for problems with the  catheterization site.  He is to stick to a low-fat, diabetic diet.  He has a  stress test on September 16 at 8:45 a.m., and he is to follow up with Dr.  Juanda Chance on September 21 at 4 p.m.  He is also to follow up with Dr. Jonny Ruiz.   DISCHARGE MEDICATIONS:  1.  Imdur 60 mg daily.  2.  Lipitor 80 mg daily.  3.  Plavix 75 mg daily.  4.  Altace 2.5 mg daily.  5.  Aspirin 325 mg daily.  6.  Toprol XL 50 mg daily.  7.  Lasix 20 mg daily.  8.  K-Dur 20 mEq daily.  9.  Nitroglycerin sublingual p.r.n.      Theodore Demark, P.A. [facility]                  Olga Millers, M.D. [facility]    RB/MEDQ  D:  10/08/2003  T:  10/08/2003  Job:  [id number]   cc:   Charlies Constable, M.D. [facility]   Corwin Levins, M.D. [facility]

## 2010-06-17 NOTE — Cardiovascular Report (Signed)
NAME:  [name], [name]                        ACCOUNT NO.:  [id number]   MEDICAL RECORD NO.:  [id number]                   PATIENT TYPE:  INP   LOCATION:  4715                                 FACILITY:  [facility]   PHYSICIAN:  Veneda Melter, M.D. [facility]               DATE OF BIRTH:  02/14/1944   DATE OF PROCEDURE:  08/27/2001  DATE OF DISCHARGE:  08/29/2001                              CARDIAC CATHETERIZATION   PROCEDURES:  1. Left heart catheterization.  2. Left ventriculogram.  3. Selective coronary angiography.  4. Selective angiography saphenous vein and internal mammary artery bypass     grafts.  5. Perclose right femoral artery.   DIAGNOSES:  1. Native three-vessel coronary artery disease.  2. Normal left ventricular systolic function.  3. Patent bypass grafts.   HISTORY OF PRESENT ILLNESS:  The patient is a 65 year old white male with a  history of coronary artery disease and coronary artery bypass graft surgery  in 1992, as well as percutaneous intervention most recently in June 2003,  with intervention stent placement of the right coronary artery and cutting  balloon treatment of the ramus intermedius.  The patient has done fairly  well.  He presents now with atypical chest pain and was admitted to the  hospital, stabilized medically.  He presents for further assessment.   TECHNIQUE:  Informed consent was obtained.  The patient was brought to the  catheterization laboratory.  A 6-French sheath was placed in the right  femoral artery.  A 6-French JL4 and JR4 catheter then used to engage the  left and right coronary arteries.  Selective angiography performed in  various projections using manual injections of contrast.  The JR4 catheter  was then used to engage the vein graft to left circumflex artery and the  internal mammary catheter used to engage the LIMA graft to the LAD.  Subsequently, a 6-French pigtail catheter was advanced in the left ventricle  and left  ventriculogram performed using power injections of contrast.  At  the termination of the case the catheters and sheaths were removed and  Perclose procedure  used to close the right femoral artery until adequate  hemostasis was achieved.  The patient tolerated the procedure well and was  transferred to the floor in stable condition.   FINDINGS:  1. Left main trunk:  Medium caliber vessel with moderate disease.  2. Left anterior descending is a medium caliber vessel which has a     bifurcating diagonal branch in the midsection as well as two separate     perforators.  The left anterior descending was 100% occluded proximally.     The distal section fills via left internal mammary artery graft.  There     was mild disease of 30% in the midsection of the takeoff of the diagonal     branch.  There is focal narrowing of 70% in the distal left anterior  descending after the anastomosis with the left internal mammary artery     graft.  The apical left anterior descending has moderate diffuse disease     of 40%.  3. Left circumflex artery:  Small caliber vessel which provides a     bifurcating marginal branch of the distal section.  The left circumflex     is occluded in its ____ segment.  The marginal branch fills via saphenous     vein graft anastomosed to the inferior division.  There is moderate     stenosis of 30% in the proximal section of the marginal branch involving     the bifurcation.  4. Ramus intermedius is a medium caliber vessel and bifurcates in the     midsection.  It has mild irregularities.  5. Right coronary artery is dominant. It is a large caliber vessel and     bifurcates to a posterior descending artery and posterior ventricular     branch terminal segment.  The right coronary has previously placed stents     in the midsection with mild irregularity of less than 10%.  There is     residual disease of 30% in the mid right coronary artery distal to the     stented  segment.  The distal right coronary artery has moderate stenosis     of 30% involving the bifurcation with the posterior descending artery.     The posterior descending artery bifurcates in its midsection and has mild     irregularities as does the posterior ventricular branch.  6. Left internal mammary artery to the left anterior descending artery is     patent.  7. Saphenous vein graft to obtuse marginal branch left circumflex artery is     patent with mild diffuse atheromatous disease of 30%.  8. Left ventricle had normal end systolic and end diastolic dimensions.     Overall left ventricular function is well preserved.  Ejection fraction     is greater than 50%.  No mitral regurgitation.  Left ventricular pressure     is 114/10, aortic is 114/70.  Left ventricular end diastolic pressure     equals 20.   ASSESSMENT AND PLAN:  The patient is a 65 year old gentleman with patent  bypass grafts and overall well-preserved left ventricular function.  He has  moderate residual disease in the distal left anterior descending although  this is a small caliber vessel.  Further assessment with a treadmill test  will be performed and medical therapy pursued if possible.  If the patient  has recurrent symptoms or evidence of ischemia, percutaneous intervention  may be considered.                                                 Veneda Melter, M.D. [facility]    NG/MEDQ  D:  08/27/2001  T:  09/02/2001  Job:  [id number]   cc:   Corwin Levins, M.D. [facility]   Bruce R. Juanda Chance, M.D. [facility]  [address]  [place]  [place] [address]

## 2010-06-17 NOTE — Cardiovascular Report (Signed)
NAME:  [name], [name]                        ACCOUNT NO.:  [id number]   MEDICAL RECORD NO.:  [id number]                   PATIENT TYPE:  INP   LOCATION:  4715                                 FACILITY:  [facility]   PHYSICIAN:  Everardo Beals. Juanda Chance, M.D. [facility]           DATE OF BIRTH:  02/14/1944   DATE OF PROCEDURE:  DATE OF DISCHARGE:  08/29/2001                              CARDIAC CATHETERIZATION   CLINICAL HISTORY:  The patient is 65 years old and has had previous bypass  graft surgery, and has had previous percutaneous coronary intervention, the  last of which was a month ago at which time he had a stent placed in the  proximal right coronary artery for in-stent re-stenosis and a Cutting  Balloon angioplasty for in-stent re-stenosis in the intermediate branch of  the circumflex artery. He had 70-80% narrowing in the LAD after the  insertion of the LIMA graft. He was readmitted with recurrent chest pain and  was studied yesterday by Dr. Chales Abrahams and found to have no re-stenosis at  either of the two PCI sites. He has second-degree atrioventricular block and  marked first-degree AV block and symptoms of dizziness. We did a treadmill  test on him today but he had chest pain and could not go very far and his  heart rate went to 105. We felt that he needed to have a permanent  pacemaker.   PROCEDURE:  Implantation of a Medtronic Kappa DDD pacemaker (model [id number],  serial [id number] with a Medtronic bipolar screw-in ventricular lead (model  #5076-58 cm, serial #[id number] Vicryl), a Medtronic bipolar screw-in  atrial lead (model #5076-52 cm, serial #[id number] Vicryl).   ANESTHESIA:  Lidocaine 1%.   ESTIMATED BLOOD LOSS:  Less than 30 cc.   COMPLICATIONS:  None.   DESCRIPTION OF PROCEDURE:  The procedure was performed in laboratory room  #6.  The left anterior chest was prepped and draped in the usual fashion,  and a venogram was performed prior to the procedure. The skin was  anesthetized with 1% local Xylocaine. Using an 18-gauge needle the  subclavian vein was entered and access was secured with a 0.38 wire.  An  incision was made below the clavicle and extended to the prepectoral fascia.  A pocket was made inferior to the incision using blunt dissection. Using two  9 French sheaths, the atrial and ventricular leads were passed through the  right atrium. A 0.038 wire was left in the subclavian vein for later access.  A figure-of-eight suture was placed at the entry site for hemostasis and for  later screening of the leads. Initially attempted to position the  ventricular lead in the high septum, but could not get a satisfactory  attachment and so we screwed this in just above the apex. Good pacing  parameters were obtained as described below. We then screwed in the atrial  lead to the right lateral atrial wall with good pacing parameters  as  described below. After removal of the stylets and  0.038 wire, the figure-of-  eight suture was secured at the entry site. The leads were attached to the  prepectoral fascia with two sutures of 1-0 silk around each silastic collar.  The pocket was irrigated with sterile kanamycin solution. The leads were  attached to the generator with the hex nut wrench. The generator was  implanted into the pocket and secured loosely to the prepectoral fascia with  1-0 silk. The subcutaneous tissue was closed with running 2-0 Dexon. The  skin was closed with running 5-0 Dexon. There was quite a bit of bleeding  and we had to do quit a bit of cautery to try and maintain hemostasis. The  patient tolerated the procedure well and left the laboratory in satisfactory  condition.   RESULTS:  Atrial bipolar:  P wave 4.6 mV.  Minimum threshold capture 0.5  volts. Resistance 650 ohms.   Atrial unipolar:  P wave 7.1 mV.  Minimum threshold capture 0.4 volts.  Resistance 638 ohms.   Ventricular bipolar:  R wave of 4.9 mV. Minimum threshold  capture 0.5 volts.  Resistance 638 ohms.   Ventricular unipolar:  R wave of 4.9 mV. Minimum threshold capture 0.5  volts. Resistance 669 ohms.   Ventricular unipolar:  R wave of 10.0 mV. Minimum threshold capture 0.3  volts. Resistance 497 ohms.   There was no pacing diaphragm in either unipolar or bipolar mode in either  lead at 10 volts. The patient tolerated the procedure well and left the  laboratory pacing both chambers.                                               Bruce Elvera Lennox Juanda Chance, M.D. [facility]    BRB/MEDQ  D:  08/28/2001  T:  09/03/2001  Job:  [id number]   cc:   Corwin Levins, M.D. [facility]   Cardiopulmonary Laboratory

## 2010-06-17 NOTE — Discharge Summary (Signed)
NAME:  [name], [name]                        ACCOUNT NO.:  [id number]   MEDICAL RECORD NO.:  [id number]                   PATIENT TYPE:  INP   LOCATION:  4715                                 FACILITY:  [facility]   PHYSICIAN:  Jesse Sans. Wall, M.D. [facility]            DATE OF BIRTH:  02/14/1944   DATE OF ADMISSION:  08/26/2001  DATE OF DISCHARGE:  08/29/2001                           DISCHARGE SUMMARY - REFERRING   PROCEDURES:  1. Cardiac catheterization.  2. Coronary arteriogram.  3. Graft angiogram.  4. Left ventriculogram.  5. Implantation of a dual-chamber Medtronic permanent pacemaker Kappa Serial     [id number] in DDD mode.   HOSPITAL COURSE:  Mr. Carrier is a 65 year old male with a history of  coronary artery disease who was cathed on 07/26/2001 and he had a PTCA and  stent at the RCA, reducing it from 90% to less than 10%.  Additionally,  during that catheterization, he had PTCA of the circumflex, reducing it from  90% to 10%.  Medical therapy was recommended for other vascular disease.   The patient began having substernal chest pain on the day of admission which  was significantly worsened from some minor chest pain that he had had since  discharge.  Nitroglycerin times three reduced the pain from a 9/10 to a 6/10  but the relief did not last.  He felt that this was his usual angina.  He  was admitted to the hospital for further evaluation and treatment.   He also had a history of sinus bradycardia and Mobitz type 1 second-degree  AV block.   The patient's CK-MBs were mildly elevated but his troponins were negative.  However, he had ongoing chest pain and so it was felt a cardiac  catheterization was indicated.  He had a cardiac catheterization on  08/27/2001 which showed a totaled LAD, a totaled circumflex and a RCA with  no significant InStent restenosis and 30% distal disease.  The LIMA to LAD  was patent, but there was a 70% stenosis in the LAD distal to the  anastomosis, and the SVG to the circumflex was patent with only a 30%  proximal stenosis in the graft.  His EF was greater than 50%, and we felt  that medical therapy was recommended and he had a treadmill stress test to  see if he had chronotropic incompetence.  He was having some episodes of  second-degree block.  The patient exercised to a heart rate of 105 although  his target heart rate was 140.  The test was stopped secondary to chest pain  and fatigue.  With the episodes of bradycardia secondary to second-degree  heart block, it was felt a permanent pacemaker was indicated.   On 08/28/2001, the patient had a Medtronic Kappa dual-lead permanent  pacemaker and it was put into DDD mode.   His followup chest x-ray showed no pneumothorax.  It showed stability and an  elevated  left hemidiaphragm that had been seen on an earlier chest x-ray.   The patient had some significant pain overnight but it was felt that he was  moving his left arm more than was advisable.  The situation was discussed  with the patient and his arm was placed in a sling.  The next day, he had  minimal pain and his incision was without significant erythema, edema or  drainage.  He was considered stable for discharge on 08/29/2001.   Chest x-ray:  No pneumothorax.  There is an elevated left hemidiaphragm that  is stable, from prior exams.   LABORATORY VALUES:  Hemoglobin 13.4, hematocrit 40.3, WBCs 5.1, platelets  128.  Sodium 139, potassium 4.1, chloride 108, CO2 26, BUN 23, creatinine  1.0, glucose 109.  CK-MB 153/5.08 to 139/5.6 to 130/5.4.  Troponin-I 0.01 x  3.   Discharge condition, improved.   DISCHARGE DIAGNOSES:  1. Chest pain, previous interventions, stable, medical therapy recommended.  2. Severe 2-vessel coronary artery disease with left anterior descending and     circumflex totaled by cath this admission.  3. Status post aorta coronary bypass surgery in 1992 with left internal     mammary artery  to the left anterior descending and saphenous vein graft     to the circumflex.  4. Status post percutaneous transluminal coronary angioplasty and stent to     the right coronary artery.  5. Status post percutaneous transluminal coronary angioplasty to the obtuse     marginal one and stent to the right coronary artery in October of 2002.  6. History of thrombocytopenia.  7. Hypertension.  8. Hypolipidemia.  9. Family history of coronary artery disease.  10.      Remote history of tobacco.  11.      Sinus bradycardia and second-degree heart block, status post     permanent pacemaker.   DISCHARGE INSTRUCTIONS:  1. His activity levels to include no lifting with his left arm until cleared     by M.D. and no driving for a week.  He is to gradually increase left arm     motion as instructed.  2. He is to take Vicodin p.r.n. for pain.  3. He is to call the office for problems with the incision.  He is to keep     it clean and dry for a week.  4. He is to follow up with the P.A. for Dr. Juanda Chance on August 13 at 4:00 p.m.     He is to follow up with Dr. Jonny Ruiz as needed.   DISCHARGE MEDICATIONS:  1. Coated aspirin 325 mg q.d.  2. Altace 5 mg q.d.  3.     Plavix 75 mg q.d.  4. Niacin 500 mg q.h.s.  5. Nitroglycerin 0.4 mg p.r.n.     RHONDA GARDNER, PA-C                      Thomas C. Daleen Squibb, M.D. [facility]    RG/MEDQ  D:  08/29/2001  T:  09/03/2001  Job:  [id number]   cc:   Everardo Beals. Juanda Chance, M.D. [facility]  [address]  [place]  [place] [address]   Corwin Levins, M.D. [facility]

## 2010-06-17 NOTE — H&P (Signed)
NAMEDOMONIC, KIMBALL              ACCOUNT NO.:  [id number]   MEDICAL RECORD NO.:  [id number]          PATIENT TYPE:  INP   LOCATION:  2035                         FACILITY:  [facility]   PHYSICIAN:  Learta Codding, M.D. LHCDATE OF BIRTH:  03-12-1945   DATE OF ADMISSION:  08/14/2004  DATE OF DISCHARGE:                                HISTORY & PHYSICAL   PRIMARY CARE PHYSICIAN:  Corwin Levins, MD, [facility].   PRIMARY CARDIOLOGIST:  Charlies Constable, MD, [facility].   CHIEF COMPLAINT:  Chest pain.   HISTORY OF PRESENT ILLNESS:  Mr. Fudala is a 65 year old white male with a  history of coronary artery disease.  He complains of approximately a one  week history of episodic chest pressure.  He has had approximately 20  episodes this week.  He only takes nitroglycerin if it lasts greater than 10  minutes.  He has taken nitroglycerin three to four times and has gotten  relief each time.  Today, his symptoms worsened and reached a 9/10.  They  started at rest.  He took one sublingual nitroglycerin and did not get  relief, so he started for the emergency room.  On route, he took a second  nitroglycerin.  By the time he arrived at the emergency room, his symptoms  were decreased and now he is pain-free without further intervention.  The  symptoms were associated with shortness of breath and weakness, but no  diaphoresis or nausea and vomiting.   The chest pain is described as a pressure.  This is his usual angina,  although the only time it has been this bad was when he had a heart attack.  This is the first time he has had symptoms since he was cath'd in April of  2006.  He had one day of general malaise where he said he was very weak and  felt chilled all day long, but he did not check his temperature and the  symptoms resolved completely after one day.  He is pain-free at the time of  exam.   PAST MEDICAL HISTORY:  1.  Aortocoronary bypass surgery in 1992 with LIMA to LAD and SVG to OM.  2.  Status post  stents to the vein graft to the OM, the RCA, and      percutaneous intervention to the ramus.  3.  Status post cardiac catheterization in April of 2006, with the SVG to OM      totaled, the LIMA to LAD patent, distal disease in the LAD at 70%,      circumflex with 90% stenosis, and the RCA patent stent, but 60% distal      disease.  His EF was 55%.  4.  Diabetes.  5.  Hypertension.  6.  Hyperlipidemia.  7.  Obesity.  8.  Family history of coronary artery disease.  9.  Remote history of MI.  10. History of AV block, status post Medtronic Kappa DDD pacemaker.  11. Possible history of prostate cancer treated with Proscar.  12. Remote history of thrombocytopenia.   PAST SURGICAL HISTORY:  1.  Multiple cardiac catheterizations.  2.  Bypass surgery.  3.  Left upper extremity surgery secondary to trauma.   SOCIAL HISTORY:  He lives in [place] with his wife and is unemployed.  He  quit tobacco 35 years ago and does not abuse alcohol or drugs.  He states he  has recently been following a stricter diabetic diet and has lost some  weight.  He has also been walking a track.   ALLERGIES:  No known drug allergies.   CURRENT MEDICATIONS:  1.  Imdur 90 mg daily.  2.  Toprol-XL 50 mg daily.  3.  Plavix 75 mg daily.  4.  Multivitamin daily.  5.  Potassium 20 mEq daily.  6.  Aspirin daily.  7.  Norvasc 5 mg daily.  8.  Actos 30 mg daily.  9.  Celexa, probably 20 mg daily.  10. Lasix 40 mg daily.  11. Mevacor 40 mg daily.  12. Vasotec 10 mg daily.  13. Xanax 0.25 mg b.i.d.  14. Proscar 5 mg daily.  15. Sublingual nitroglycerin p.r.n.   FAMILY HISTORY:  His mother died at age 45 of pneumonia, and his father died  at age 69 after going into a coma, but he was very young and does not have  any other details.  He has two sisters that have died of coronary artery  disease, and one sister that died secondary to cancer.   REVIEW OF SYSTEMS:  Significant for one day of chills as described  above.  Chest pain as described above.  He also has had shortness of breath as well  as dyspnea on exertion, orthopnea, and occasional PND.  He states his  abdomen has been increasing in size at times recently.  He has palpitations.  He says he occasionally wheezes.   He had hematuria for which he saw Dr. Annabell Howells and was put on Proscar.  He says  he was told he had cancer, but it was very early.  He has occasional  problems with depression or anxiety.  He denies arthralgias or GI symptoms.  Review of systems is otherwise negative.   PHYSICAL EXAMINATION:  VITAL SIGNS:  Temperature is 97.9, blood pressure  100/72, heart rate 62, respiratory rate 22, O2 SATs ranged from 97% on 2  liters.  GENERAL:  He is a well-developed, overweight, white male in no acute  distress.  HEENT:  His head is normocephalic and atraumatic with pupils equal, round,  and reactive to light and accommodation, extraocular movements intact, nose  without discharge.  NECK:  There is no lymphadenopathy, thyromegaly, bruit, or JVD noted.  CV:  His heart is regular in rate and rhythm with an S1 and S2, and no  significant murmur, rub, or gallop was noted.  LUNGS:  Clear to auscultation bilaterally.  SKIN:  No rashes or lesions are noted, and surgical scars are well healed.  ABDOMEN:  Soft and nontender with active bowel sounds.  EXTREMITIES:  There is no cyanosis, clubbing, or edema noted.  MUSCULOSKELETAL:  There is no joint deformity or effusions, and no spine or  CVA tenderness.  NEURO:  He is alert and oriented with cranial nerves II-XII grossly intact.   CHEST X-RAY:  Pending at the time of dictation.   ELECTROCARDIOGRAM:  Sinus rhythm with ventricular pacing.   LABORATORY DATA:  Pending at the time of dictation, but point-of-care  markers are negative x1.   ASSESSMENT AND PLAN:  1.  Chest pain:  Mr. Gomillion will be admitted and cardiac enzymes will  be     cycled.  If cardiac enzymes are negative for MI, a  Myoview will be      performed tomorrow in the hospital.  If his cardiac enzymes are      positive, cardiac catheterization is indicated to reassess his anatomy.      He will be continued on his home dose of Imdur and will add heparin to      his medication regimen.  2.  Hyperlipidemia:  The patient has been compliant with his Mevacor and      will checking a fasting lipid profile in the a.m.  3.  The patient is otherwise stable and will be continued on his home      medications.   Dr. Andee Lineman saw the patient and determined the plan of care.      Hermenia Bers   RB/MEDQ  D:  08/14/2004  T:  08/14/2004  Job:  [id number]

## 2010-06-17 NOTE — H&P (Signed)
NAME:  [name], [name]                        ACCOUNT NO.:  [id number]   MEDICAL RECORD NO.:  [id number]                   PATIENT TYPE:  INP   LOCATION:  4715                                 FACILITY:  [facility]   PHYSICIAN:  Jesse Sans. Wall, M.D. [facility]            DATE OF BIRTH:  02/14/1944   DATE OF ADMISSION:  08/26/2001  DATE OF DISCHARGE:                                HISTORY & PHYSICAL   CHIEF COMPLAINT:  Chest pain, bringing him to the emergency room by EMS.   [name] is a 65 year old married white male, with a history of  hypertension, hyperlipidemia, and known coronary artery disease (status post  coronary artery bypass graft surgery in 1992 by Dr. Andrey Campanile).  He was  hospitalized in June with a subendocardial infarct.  He underwent successful  stenting of an in-stent restenosis of the proximal right coronary artery,  and also successful cutting balloon angioplasty for in-stent restenosis in  the circumflex intermediate branch.  He also had a 90% distal LAD beyond the  left internal mammary graft insertion that was patent.   He has also had second-degree AV block and was seen in consultation by Dr.  Graciela Husbands at that time.  Outpatient Holter is pending.   He came in today after beginning to have some "toothache" pain in his chest  around 7:30.  He walked his dog and became short of breath.  He then began  more pain.  He describes this as severe.  He took two Darvocet with no  relief.  He then took sublingual nitroglycerin spray with no relief.  EMS  was called and gave him several nitroglycerin en route.   His EKG shows sinus bradycardia with no acute ST segment changes.  This is  unchanged from before.  He is in no acute distress.  His skin is warm and  dry.  Blood pressure was low, so we bolused him with fluids.   MEDICATIONS:  1. Plavix 75 mg q.d.  2. Aspirin 325 mg q.d.  3. Altace 5 mg q.d.  4. Nitroglycerin p.r.n.  5. Niaspan 500 mg q.d.   ALLERGIES:  No known drug  allergies.   PAST MEDICAL HISTORY:  He has a history of mild thrombocytopenia, it is now  resolved.   SOCIAL HISTORY:  He lives in [place] with his wife.  He is a Engineer, maintenance.  He smoked for 50 years, but quit in 1992.  He does not drink.   FAMILY HISTORY:  Markedly positive for coronary artery disease and stroke.   REVIEW OF SYSTEMS:  Noncontributory, other than in HPI.   PHYSICAL EXAMINATION:  VITAL SIGNS:  Blood pressure 108/70, pulse 55 and  sinus bradycardia, temperature 97.1, respiratory rate 20 and unlabored.  O2  saturations 95% on room air.  SKIN:  Warm and dry.  GENERAL:  He is a well developed, slightly obese white male; in no acute  distress.  HEENT:  Totally unremarkable.  NECK:  Supple without JVD.  Carotid upstrokes are equal bilaterally without  bruits.  CARDIAC:  Reveals an S4.  LUNGS:  Clear to auscultation and percussion.  ABDOMINAL:  Soft with good bowel sounds.  EXTREMITIES:  With no clubbing, cyanosis or edema.  NEUROLOGIC:  Grossly intact.   CHEST X-RAY:  Shows decreased volumes with increased left hemidiaphragm,  with left lower lobe atelectasis.  He has cardiac enlargement.   EKG:  Again, shows sinus bradycardia with first-degree AV block and no acute  changes.   LABORATORY DATA:  Pending.   ASSESSMENT:  1. Chest pain.  Despite the severity of his discomfort and the duration, his     EKG is unchanged.  He recently had an adenosine Cardiolite in July in the     office, which showed an area of infarction in the anterior septum, but     did not show any distal ischemia.  Will admit and observe with cardiac     enzymes.  If he rules out and his EKG is normal in the morning, I would     discharge him home without further cardiac workup at this time.  2. First-degree AV block, with sinus bradycardia.  With a history of second-     degree block in the hospital during the subendocardial infarct.  Holter     monitor is pending, concerning permanent  pacemaker implantation.  3. Hypertension. Secondary to nitrates.  Will use IV fluids with normal left     ventricular function.                                               Thomas C. Daleen Squibb, M.D. [facility]    TCW/MEDQ  D:  08/26/2001  T:  08/28/2001  Job:  [id number]   cc:   Shary Key, M.D.   Bruce Elvera Lennox Juanda Chance, M.D. [facility]  [address]  [place]  [place] [address]

## 2010-06-17 NOTE — Discharge Summary (Signed)
NAMEDIJUAN, SLEETH              ACCOUNT NO.:  [id number]   MEDICAL RECORD NO.:  [id number]          PATIENT TYPE:  INP   LOCATION:  6527                         FACILITY:  [facility]   PHYSICIAN:  Charlies Constable, M.D. [facility] DATE OF BIRTH:  May 26, 1945   DATE OF ADMISSION:  08/14/2004  DATE OF DISCHARGE:  08/18/2004                                 DISCHARGE SUMMARY   PROCEDURES:  1.  Cardiac catheterization.  2.  Coronary arteriogram.  3.  Left ventriculogram.  4.  PTCA and TAXUS stent to one vessel.  5.  PTCA to a second site.  6.  Left internal mammary artery arteriogram.   DISCHARGE DIAGNOSES:  1.  Unstable anginal pain.  CK-MB peaked to 77/8.6 with normal troponins.      95% circumflex treated with a TAXUS stent and 70% circumflex treated      with PTCA  2.  Status post aortocoronary bypass surgery in 1992 with left internal      mammary artery to left anterior descending and saphenous vein graft to      obtuse marginal.  3.  History of stents to the saphenous vein graft to obtuse marginal as well      as to the right coronary artery, saphenous vein graft to obtuse marginal      total by catheterization in April 2006.  4.  Distal left anterior descending disease of 70% after the anastomosis  5.  Preserved left ventricular function with an ejection fraction of 55% and      inferior hypokinesis.  6.  Diabetes.  7.  Hypertension.  8.  Hyperlipidemia.  9.  Obesity.  10. Family history of coronary artery disease.  11. History of prostate carcinoma per the patient on medical therapy.  12. Remote history of myocardial infarction.  13. History of AV block, status post Medtronic Kappa DDM and pacemaker.  14. Remote history of thrombocytopenia.   HOSPITAL COURSE:  Mr. Gille is a 65 year old male with known coronary  artery disease.  He had episodic chest pressure and came to the emergency  room where he was admitted for further evaluation and treatment.   He had mild elevation in  his CK-MBs with a peak index of 3.1.  His troponins  were all negative.  It was decided that cardiac catheterization was  indicated and this was performed on August 17, 2004.  The cardiac  catheterization showed a 95% circumflex in the mid and distal portion and a  70% proximal circumflex.  The proximal circumflex was treated with PTCA  reducing that stenosis to 40% and the 95% circumflex was treated with TAXUS  stent reducing that stenosis to 0.  He had distal disease in the LAD and  circumflex of 70% and in the RCA 60% for which medical therapy is  recommended.  His EF was within normal limits.   The next day his groin was stable.  His post procedure laboratories had not  significantly changed.  His platelets were within normal limits at 172.  Mr.  Hillmer was considered stable for discharge with outpatient follow-up  arranged.  Of  note, a lipid profile was performed which showed a total  cholesterol of 109, triglycerides 97, HDL 33, LDL 57.   DISCHARGE INSTRUCTIONS:  His activity is to be per the discharge sheet.  He  is to stick to a low fat diabetic diet.  He is to call our office with any  problems with the catheterization site.   DISCHARGE MEDICATIONS:  1.  Imdur 90 mg daily.  2.  Toprol XL 50 mg daily.  3.  Plavix 75 mg daily.  4.  Multivitamins daily.  5.  K-Dur 20 mEq daily.  6.  Aspirin 325 mg daily.  7.  Actos 30 mg daily.  8.  Vasotec 10 mg daily.  9.  Celexa 20 mg daily.  10. Zocor 20 mg daily.  11. Xanax 0.25 mg as prior to admission.  12. Proscar 5 mg daily.  13. Amlodipine 10 mg daily.  14. Sublingual nitroglycerin 30 p.r.n.  15. Lasix 40 mg daily.      Hermenia Bers   RB/MEDQ  D:  08/18/2004  T:  08/18/2004  Job:  [id number]   cc:   Corwin Levins, M.D. [facility]

## 2010-06-17 NOTE — H&P (Signed)
NAMEXAN, INGRAHAM              ACCOUNT NO.:  [id number]   MEDICAL RECORD NO.:  [id number]           PATIENT TYPE:   LOCATION:                                 FACILITY:   PHYSICIAN:  Tereso Newcomer, P.A.     DATE OF BIRTH:  1945-08-19   DATE OF ADMISSION:  DATE OF DISCHARGE:                                HISTORY & PHYSICAL   PRIMARY CARDIOLOGIST:  Charlies Constable, M.D.   PRIMARY CARE PHYSICIAN:  Corwin Levins, M.D.   CHIEF COMPLAINT:  Chest pain.   HISTORY OF PRESENT ILLNESS:  Mr. Adell is a very pleasant 65 year old male  patient followed by Dr. Juanda Chance with a history of coronary disease status  post bypass surgery and known occlusion of vein graft to the circumflex as  well as prior intervention with a drug-eluting stent to the native RCA who  recently had undergone Myoview scanning secondary to chest pain. He  underwent cardiac catheterization April 19 which showed a total occlusion of  LAD, 70% ostial stenosis of the circumflex, and irregularities in the distal  circumflex and 30% focal in-stent narrowing in the mid RCA. His LIMA to the  LAD was patent, and his vein graft to the circumflex was totally occluded,  and this was old. The patient's EF was 55 to 60% with minimal inferobasilar  wall hypokinesis. It was felt that medical therapy was warranted, and his  nitrates were adjusted. The patient had initially done well without any  recurrent chest pain until about 2 two weeks. He presents to the office  today with complaints of recurrent chest discomfort and shortness of breath  over the last couple of weeks. He notes symptoms with just minimal exertion.  Today in the office, he actually notes symptoms at rest. He sleeps on 2  pillows usually. He knows that lately he has had to add another pillow on  some nights. He denies any paroxysmal nocturnal dyspnea. He denies any  syncope but has had some near syncope when he gets very short of breath.  These symptoms were consistent  with his previous anginal symptoms.   PAST MEDICAL HISTORY:  As noted above.  1.  Coronary disease with anatomy as noted above. He has had history of      stenting to the vein graft to the obtuse marginal. His bypass was      completed in 1992.  2.  He has preserved LV function.  3.  Treated dyslipidemia.  4.  Diabetes mellitus type 2.  5.  Hypertension.  6.  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  7.  He has a history of prostate cancer in the past.  8.  Remote history of thrombocytopenia.   MEDICATIONS:  1.  Aspirin 325 mg daily.  2.  Plavix 75 mg daily.  3.  Enalapril 10 mg b.i.d.  4.  Norvasc 10 mg daily.  5.  Actos 30 mg daily.  6.  Lasix 40 mg daily.  7.  Potassium 20 mEq daily.  8.  Multivitamin daily.  9.  Proscar 5 mg daily.  10. Imdur 60 mg daily.  11. Zocor 80 mg daily.  12. Nitroglycerin p.r.n.  13. Xanax p.r.n.  14. Vicodin p.r.n.   ALLERGIES:  CRESTOR.   SOCIAL HISTORY:  Denies any current tobacco or alcohol abuse. He is married.   FAMILY HISTORY:  Significant for coronary disease in his siblings.   REVIEW OF SYSTEMS:  See HPI. Denies any melena, hematochezia, hematuria,  dysuria, fever, chills or cough. The rest of review of systems is negative.  The patient does note significant daytime hypersomnolence. He has also been  told by his wife that he snores and stops breathing while he is sleeping.   PHYSICAL EXAMINATION:  GENERAL:  Well-developed, well-nourished in no acute  distress.  VITAL SIGNS:  Blood pressure is 102/70, pulse 74, weight 260 pounds.  HEENT:  Normocephalic, atraumatic. Eyes:  PERRLA, EOMI, sclerae clear.  NECK:  Without lymphadenopathy. Carotids are 2+ bilaterally without bruits.  CARDIAC:  S1 and S2. Regular rate and rhythm without murmurs.  LUNGS:  Clear to auscultation bilaterally without wheezing, rhonchi or  rales.  ABDOMEN:  Soft and nontender with normal active bowel sounds. No   organomegaly.  EXTREMITIES:  Without edema. Calves were soft and nontender without palpable  cords bilaterally. Dorsalis pedis and posterior tibialis pulses 2+  bilaterally. No femoral artery bruits noted on auscultation.  NEUROLOGICAL:  He is alert and oriented x3. Cranial nerves II-XII grossly  intact. No focal deficits noted.   STUDIES:  Electrocardiogram reveals a sinus rhythm with a heart rate of 73,  V pacing.   IMPRESSION:  1.  Unstable angina pectoris.  2.  Coronary artery disease.      1.  Status post coronary artery bypass grafting in 1992.      2.  Anatomy as noted above with totally occluded vein graft to the          circumflex and patent left internal mammary artery to left anterior          descending artery and 70% ostial stenosis of the circumflex and          totally occluded native left anterior descending artery.      3.  History of stenting to the native right coronary artery as well as          the vein graft to the obtuse marginal in April 2006.  3.  Preserved left ventricular function.  4.  Diabetes mellitus type 2.  5.  Hypertension.  6.  Treated dyslipidemia.  7.  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  8.  History of prostate cancer.  9.  Daytime hypersomnolence - rule out obstructive sleep apnea.   PLAN:  The patient was also interviewed and examined by Dr. Tenny Craw today. We  plan to admit him directly from the office to [facility] via EMS.  We feel that he needs to undergo cardiac catheterization to further evaluate  his unstable angina. Risks and benefits have been explained to the patient,  and he agrees to proceed. We will treat him with heparin and nitroglycerin  drip. We will hold his Imdur. We will continue his aspirin and Plavix. We  will start him on a low-dose of beta blocker to see if his blood pressure will tolerate that. We will plan on cardiac catheterization tomorrow with  Dr. Juanda Chance. The patient will  eventually need workup for possible sleep  apnea. This can be achieved as an outpatient.  Tereso Newcomer, P.A.     SW/MEDQ  D:  08/09/2005  T:  08/09/2005  Job:  [id number]   cc:   Corwin Levins, M.D. [facility]  [address]  [place]  [place] [address]

## 2010-06-17 NOTE — Cardiovascular Report (Signed)
NAME:  [name], [name]                        ACCOUNT NO.:  [id number]   MEDICAL RECORD NO.:  [id number]                   PATIENT TYPE:  INP   LOCATION:  2906                                 FACILITY:  [facility]   PHYSICIAN:  Carole Binning, M.D. [facility]         DATE OF BIRTH:  September 23, 1945   DATE OF PROCEDURE:  12/01/2002  DATE OF DISCHARGE:                              CARDIAC CATHETERIZATION   PROCEDURE PERFORMED:  1. Left heart catheterization with coronary angiography, bypass graft     angiography and left ventriculography.  2. Percutaneous transluminal coronary angioplasty with stent placement     utilizing a drug-eluting stent and PercuSurge distal protection in the     saphenous vein graft to the posterior lateral branch.   INDICATION:  Mr.  Cicero is a 65 year old male with history of previous  coronary bypass surgery and multiple coronary interventions.  He presented  to the hospital yesterday with chest pain and ruled in for non-Q-wave  myocardial infarction with elevated CPK-MB.  He was therefore referred for  cardiac catheterization.   CATHETERIZATION PROCEDURAL NOTE:  A 6 French sheath was placed in the right  femoral artery.  The native coronary arteries were imaged with a 6 [place] JL-  4 and JR-4 catheters.  The saphenous vein graft was imaged with a JR-4  catheter.  Left internal mammary artery was imaged with an internal mammary  catheter.  Left ventriculography was performed with an angled pigtail  catheter.  Contrast was Omnipaque.  There were no complications.   RESULTS:   HEMODYNAMICS:  1. Left ventricular pressure 110/20.  2. Aortic pressure 116/80.  3. There is no aortic valve gradient.   LEFT VENTRICULOGRAM:  Wall motion is normal.  Ejection fraction estimated at  60%.  There is no significant mitral regurgitation.   CORONARY ARTERIOGRAPHY (RIGHT DOMINANT):  Left main is normal.   Left anterior descending artery is 100% occluded at its origin.  The  LAD  fills via saphenous vein graft.   Left circumflex gives rise to a large first obtuse marginal branch following  which there is a 70% stenosis in the mid vessel followed by 99%, followed by  95% stenosis in the mid circumflex.  The distal circumflex consisting of  posterior lateral branch fills via saphenous vein graft.  There is a large  first marginal branch arising from the proximal circumflex which has a stent  within it.  The stent is widely patent with 0% stenosis within the stent.   Right coronary artery is a dominant vessel.  There are overlapping stents in  the proximal and mid right coronary artery.  There is a 60% area of stenosis  within the mid portion of the stents.  Just beyond the more distal stent  there is a 50% stenosis.  The distal right coronary artery has a 40%  stenosis just prior to the origin of the posterior descending artery.  The  distal  right coronary artery gives rise to a large posterior descending  artery and three small posterior lateral branches.   Left internal mammary artery to the mid LAD is patent throughout its course.  There is stent in the LAD just beyond the mammary artery insertion.  The  stent is patent with less than 20% stenosis within the stent.  The internal  mammary artery fills the mid and distal LAD as well as a diagonal branch  arising from the mid LAD.   The saphenous vein graft to the posterior lateral branch has a long 30%  stenosis in the mid graft followed by discrete 95% stenosis in the mid  graft.  This graft is anastomosed to a large posterior lateral branch.   IMPRESSION:  1. Normal left ventricular systolic function.  2. Native three-vessel coronary artery disease. There was a patent stent in     the first obtuse marginal branch.  There were patent stents in the right     coronary artery with moderate area of stenosis within the stents which     appears to be borderline severity.  The patient is status post coronary      artery bypass surgery.  There is a patent LIMA to the LAD with patent     stent beyond the LIMA insertion.  There is a new 95% stenosis in the     saphenous vein graft to the posterior lateral branch.  This appears to be     the culprit lesion.   PLAN:  Percutaneous intervention of the saphenous vein graft.  See below.   PERCUTANEOUS TRANSLUMINAL CORONARY ANGIOPLASTY PROCEDURAL NOTE:  Following  completion of the diagnostic catheterization, we proceeded with percutaneous  coronary intervention.  The pre-existing 6 French sheath in the right  femoral artery was exchanged over wire for a 7 [place] sheath.  The patient  had been pretreated with heparin and Integrelin and additional heparin was  administered to maintain an ACT of greater than 200 seconds.  We used a 7  [place]  Hockey stick guiding catheter with side holes.  We advanced a  PercuSurge wire beyond the area of stenosis into the distal portion of the  vein graft.  The PercuSurge balloon was then inflated to 4 mm.  We then  carefully positioned a 3.0 x 23-mm Cypher drug-eluting stent across the area  of stenosis in the vein graft and deployed the stent at 14 atmospheres.  We  then performed two aspirations with the export catheter and deflated the  PercuSurge balloon.  Following this, we went back in with a 3.25 x 20-mm  Quantum balloon.  We, again, inflated our PercuSurge balloon to four  atmospheres and then performed a balloon inflation within the stent with a  Quantum balloon to 15 atmospheres.  We then, again, performed two  aspirations with the export catheter and then following this deflated the  PercuSurge balloon.  Intermittent doses of Verapamil were administered to  improve antegrade flow.  Final angiographic images were then obtained  revealing patency of the vein graft with 0% residual stenosis at the stent  site and TIMI-3 flow in the distal vessel with no evidence of distal embolization.   COMPLICATIONS:   None.   RESULTS:  Successful placement of a drug-eluting stent in the mid portion of  the saphenous vein graft to the posterior lateral branch.  PercuSurge distal  protection was utilized.  A 95% stenosis was reduced to 0% residual with  TIMI-3 flow.  PLAN:  Integrelin will be continued for an additional 24 hours.  It is  recommended that the patient be treated with Plavix for nine months and  attention will be paid to aggressive risk factor modification.                                               Carole Binning, M.D. [facility]    MWP/MEDQ  D:  12/01/2002  T:  12/01/2002  Job:  [id number]   cc:   Corwin Levins, M.D. [facility]

## 2010-06-17 NOTE — Cardiovascular Report (Signed)
[place]  Patient:    [name], [name]                     MRN: [id number] Proc. Date: 06/04/00 Adm. Date:  [id number] Disc. Date: [id number] Attending:  Rollene Rotunda CC:         Corwin Levins, M.D. [facility]  Daisey Must, M.D. [facility]  Cardiopulmonary Lab   Cardiac Catheterization  INDICATIONS:  Mr. [name] is 65 years old and had coronary bypass surgery in 1992.  He was admitted to the hospital today with chest pain which began yesterday.  He had recurrent pain at 12 noon which persisted this afternoon and because of positive enzymes and persistent pain he was brought to the catheterization lab.  His EKG showed nonspecific ST-T changes.  DESCRIPTION OF PROCEDURE:  The procedure was performed via the right femoral artery using an arterial sheath and 6 French preformed coronary catheters.  A front wall arterial puncture was performed and Omnipaque contrast was sued.  A JL4 catheter was used for injection of the vein graft.  The LIMA catheter was used for injection of the LIMA graft.  After the completion of the diagnostic procedure, we made a decision to proceed with intervention on the continuous marginal vessel.  The patient was given weight adjusted heparin to perform an ACT to greater than 200 seconds seconds and was given double bolus Integrilin and infusion. We used a 4.0 Vota 7 [place] guiding catheter and short Patriot wire.  We crossed the lesion in the circumflex marginal vessel with the wire without difficulty.  We directed stented with a 2.25 x 13 mm pigtail performing one inflation to 15 atmospheres x 45 seconds.  Repeat diagnostic studies were then performed through the guiding catheter.  The patient tolerated the procedure well and left the laboratory in satisfactory condition.  RESULTS:  HEMODYNAMICS:  The aortic pressure was 117/76 with mean of 91.  The left ventricular pressure was 117/32.  Left main coronary artery:  The  left main coronary artery is free of significant disease.  Left anterior descending:  The left anterior descending artery is completely clear with no __________.  Circumflex artery:  The circumflex artery gave rise to a moderately large marginal branch and three posterior lateral branches.  There was a noted 99% stenosis in the proximal portion of the marginal branch with TIMI-2 flow distally.  There was 80% narrowing in the mid circumflex artery, and there is competing flow distally.  Right coronary artery:  The right coronary artery is a moderately large vessel and gave rise to a conus branch, right ventricular branch, a posterior descending branch, and three posterior lateral branches.  There was 50 and 40% narrowing in the mid vessel.  The vein graft to the posterior lateral branch of the circumflex artery was patent with 30% narrowing in its proximal portion and 40% narrowing in its midportion and irregularities in its proximal and midportion.  The LIMA graft to the LAD was patent and functioned normally.  The left ventriculogram was performed in the RAO projection and showed good wall motion with no areas of hypokinesis.  The estimated ejection fraction was 55%.  LEFT VENTRICULOGRAM:  The left ventriculogram was performed in the LAO projection and showed good wall motion with no areas of hypokinesis.  Following stenting of the circumflex marginal vessel, the stenosis improved from 99% to 0% and the flow improved from TIMI-2 to TIMI-3 flow.  CONCLUSION: 1. Coronary artery disease  status post coronary artery bypass graft surgery    in 1992 with non-ST segment elevation, myocardial infarction with total    occlusion of the left anterior descending artery, 99% stenosis of the    circumflex marginal vessel with 80% stenosis in the mid circumflex artery,    40% and 50% mid stenosis in the right coronary artery, 30% proximal and    and 40% mid stenosis in the saphenous vein  graft to the posterior lateral    branch of the circumflex artery, patent LIMA graft to the LAD and    normal LV function. 2. Successful stenting of the circumflex marginal vessel with improvement    in diameter narrowing from 99% to 0% and improvement in the flow from    TIMI-2 to TIMI-3 flow.  DISPOSITION:  The patient is sent to the unit for further observation.  We will probably target discharge in 48 hours. DD:  06/06/00 TD:  06/07/00 Job: [id number]
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
## 2010-06-17 NOTE — Discharge Summary (Signed)
NAMEABDULAH, [name]              ACCOUNT NO.:  [id number]   MEDICAL RECORD NO.:  [id number]          PATIENT TYPE:  INP   LOCATION:  2006                         FACILITY:  [facility]   PHYSICIAN:  Pricilla Riffle, M.D.    DATE OF BIRTH:  1945/11/20   DATE OF ADMISSION:  08/09/2005  DATE OF DISCHARGE:  08/11/2005                           DISCHARGE SUMMARY - REFERRING   DISCHARGE DIAGNOSES:  1.  Chest discomfort, possible angina-related.  Continued medical treatment.  2.  History as noted below.   PROCEDURES PERFORMED:  Cardiac catheterization on August 10, 2005 by Dr. Charlies Constable.   SUMMARY OF HISTORY:  [name] is a 65 year old patient who over the  preceding two weeks complained of recurring chest discomfort associated with  shortness of breath with minimal exertion.  When seen in the office on the  day of admission, he was describing rest episodes as well as some orthopnea.  He feels that these symptoms are similar to his prior anginal symptoms.   PAST MEDICAL HISTORY:  1.  Bypass surgery with stenting to the saphenous vein graft to the OM.  2.  He has had prior stenting to the native RCA prior to his bypass.  3.  Catheterization May 18, 2005 showed two vessel coronary artery disease      and a 30% in-stent restenosis in the RCA.  The LIMA to the LAD was      patent.  The vein graft to the circumflex was totally occluded.      Ejection fraction was 55-60% with inferobasilar hypokinesis.  Medical      therapy and nitrates were adjusted.  4.  Hyperlipidemia.  5.  Type 2 diabetes.  6.  Hypertension.  7.  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  8.  Prostate cancer.  9.  History of thrombocytopenia.   LABORATORY DATA:  EKG shows ventricular pacing.  Chest x-ray shows stable  cardiomegaly with pacer, left basilar linear atelectasis versus scarring.  Urinalysis was unremarkable.  CK-MBs and relative indexes were slightly  elevated; however, three troponins were negative.  BNP  was 332.  TSH 0.667.  Admission sodium was 137, potassium 4.0, BUN 17, creatinine 1.1, glucose was  elevated at 218.  Normal LFTs.  Hemoglobin and hematocrit were 13.7 and  40.2, normal indices.  Platelets 196.  WBC 7.0.   HOSPITAL COURSE:  [name] was admitted from the office by Tereso Newcomer  and Dr. Tenny Craw for further evaluation.  He was placed on his home medications  as well as IV heparin.  Cardiac catheterization was performed on August 10, 2005 by Dr. Juanda Chance.  He had a 70% proximal circumflex.  The stent that was  in the native circumflex did not show any restenosis.  He had a 50% in-stent  RCA restenosis and a 40% mid LAD past the saphenous vein graft to the LAD  anastomosis.  The LIMA to the LAD was patent.  The saphenous vein graft to  the circumflex was total.  The ejection fraction was 60%.  Dr. Juanda Chance felt  that his anatomy had not changed  very much and recommended continued medical  treatment and to consider adding Ranexa.  He felt if there were samples at  the office, that we may get these to him.  He had a chest CT to evaluate for  possible pulmonary embolism.  However, this was negative.  Samples were able  to be obtained from the office in regards to the Ranexa, and he will be  followed for prolonged QT.   DISPOSITION:  [name] is discharged home.  He is asked to maintain low-  salt, low-fat, low-cholesterol diet.  His activities and wound care are per  the Supplemental Discharge Instruction Sheet.  He was asked to bring all  medications to all appointments.   MEDICATIONS:  1.  New medication is Ranexa 500 mg b.i.d.  He will have a month worth of      samples that he is instructed he may pick up at the office when he      discharged from the hospital, and he received a prescription.  2.  He was asked to continue aspirin 325 every day.  3.  Plavix 75 every day.  4.  Enalapril 10 b.i.d.  5.  Norvasc 10 every day.  6.  Actos 30 every day.  7.  Lasix 40 every  day.  8.  Potassium 20 mEq every day.  9.  Multivitamin every day.  10. Proscar 5 mg every day.  11. Zocor 80 mg nightly.  12. Nitroglycerin 0.4 as needed.  13. Xanax and Vicodin as needed.  14. Imdur 60 mg every day.   DISCHARGE INSTRUCTIONS:  He will have an EKG in the office on Tuesday, August 18, 2005, at 10:00.  This is to evaluate for prolonged QTc (it is noted EKG  in the hospital on August 10, 2005 shows a QTc interval of 499).   FOLLOWUP:  He will follow up with Dr. Regino Schultze physician's assistant on  August 30, 2005 at 1 p.m.  He is asked to arrange followup appointment with  Dr. Jonny Ruiz as needed.   DISCHARGE TIME:  Greater than 30 minutes.      Joellyn Rued, P.A. [facility]    ______________________________  Pricilla Riffle, M.D.    EW/MEDQ  D:  08/11/2005  T:  08/11/2005  Job:  [id number]   cc:   Jonny Ruiz, M.D.   Charlies Constable, M.D. [facility]  [address]  [address]  [place]  [place] [address]

## 2010-06-17 NOTE — Discharge Summary (Signed)
. [facility]  Patient:    [name], [name] Visit Number: [id number] MRN: [id number]          Service Type: MED Location: 2000 2006 01 Attending Physician:  Daisey Must Dictated by:   Delton See, P.A. Admit Date:  07/26/2001 Discharge Date: 07/30/2001   CC:         Corwin Levins, M.D. [facility]   Referring Physician Discharge [place]ma  DATE OF BIRTH:  [birth year]  HISTORY OF PRESENT ILLNESS:  The patient is a pleasant 65 year old male who was admitted to [facility] on July 26, 2001, for evaluation of chest pain.  The patient does have known coronary artery disease.  He underwent coronary artery bypass graft surgery in 1992 with a LIMA to the LAD and a saphenous vein graft to the circumflex.  He has had subsequent interventions.  In October of 2002, he had a PTCA performed on OM1. He had a PTCA stent in the proximal RCA.  He was admitted for further evaluation of his chest pain.  PAST MEDICAL HISTORY:  Please see the cardiac history as noted above.  He also has a history of hypertension, dyslipidemia, and a history of sinus bradycardia.  ALLERGIES:  No known drug allergies.  SOCIAL HISTORY:  The patient lives in [place], [place], with his wife.  He is a Games developer.  He has a remote tobacco.  He denies alcohol.  HOSPITAL COURSE:  As noted, this patient was admitted to [facility] for further evaluation of chest pain.  He ruled in for a non-Q-wave MI.  He underwent cardiac catheterization on the day of admission, performed by Bruce R. Juanda Chance, M.D.  He was found to have a totally occluded LAD.  The circumflex had a 90% end-stent restenosis.  The RCA had a 90% end-stent stenosis.  The saphenous vein graft to the obtuse marginal had a 30% lesion.  The LIMA to the LAD had a 90% distal stenosis.  The ejection fraction was estimated to be 60%.  The patient underwent PTCA stenting of the RCA  lesion, reducing it from 90% to less than 10%.  He had a PTCA of the circumflex lesion reducing it from 90% to 10%.  He tolerated this well.  Bruce Elvera Lennox Juanda Chance, M.D., did not feel that the LAD lesion had changed significantly from his previous intervention.  It was felt that this would be a difficult lesion to approach.  It was recommended that he have a Cardiolite on an outpatient basis with a high threshold for further interventions on that lesion.  Arrangements were to be made to discharge the patient.  However, he did develop some bradycardia with second degree Mobitz type 1 AV block.  He also had a baseline first degree AV block.  He was monitored for several days. This eventually improved.  The patient was seen by Nathen May, M.D., F.A.C.C., prior to discharge.  It was recommended that the patient have a 24-hour Holter for further evaluation.  He should also have an exercise Cardiolite performed to evaluate his response to exercise.  Prior to discharge, the patient developed some right lower extremity pain and swelling.  He had a Doppler performed that showed no evidence of DVT.  An incidental finding was some enlargement of the right inguinal lymph nodes.  The patient was seen in consultation by the Cardiovascular and Thoracic Surgeons prior to discharge with regards to the lower extremity edema.  As noted,  there was no evidence of DVT.  It was recommended that he elevate this extremity well at rest and continue to ambulate as tolerated.  It was felt that this would resolve and return to his precatheterization status.  The patient also had mildly decreased platelets during his stay.  The most recent CBC on July 27, 2001, revealed a platelet count of 145,000.  This can be followed up at his next office visit.  The patient was eventually discharged on July 30, 2001, in improved and stable condition.  LABORATORY DATA:  Electrolytes on July 28, 2001, revealed a potassium of  4.0, sodium 141, chloride 109, and carbon dioxide 27.  The magnesium was 2.0 and phosphorus 3.4.  A D-dimer was 0.32.  As noted, the most recent CBC on July 27, 2001, revealed hemoglobin 13.8, hematocrit 40.5, WBC 4.8, and platelets 145,000.  Cardiac enzymes on admission revealed a CK of 923, an MB of 28, an index of 3.1, and a troponin I of 0.01.  LFTs were mildly elevated with an AST of 39 and an ALT of 44.  A lipid profile revealed cholesterol 141, triglycerides 108, HDL 42, and LDL 77.  A TSH was 1.064.  A portable chest x-ray on July 26, 2001, showed clear lungs with moderate cardiomegaly.  There was an opacity medially at the left lung base which was felt to be possibly be due to atelectasis, however, pneumonia could not be excluded.  A follow-up chest x-ray was recommended.  EKG on the day of discharge revealed normal sinus rhythm with first degree AV block and rate 64 beats per minute.  There were inverted T waves in 3 and aVF and small Q waves in the inferior leads.  DISCHARGE MEDICATIONS: 1. Plavix 75 mg daily for three months. 2. Coated aspirin 325 mg daily. 3. Altace 5 mg daily. 4. Nitroglycerin p.r.n. for chest pain. 5. Darvocet one q.4-6h. as needed for right leg pain, #30, no refills.  ACTIVITY:  The patient was instructed to avoid any strenuous activity, although he could walk as tolerated.  He was to keep his leg elevated while at rest.  DIET:  He was to be on a low-salt, low-fat diet.  WOUND CARE:  He was told to call the office if he had any increased pain, swelling, or bleeding from his groin.  FOLLOW-UP:  He is to have a 24-hour Holter monitor which will be scheduled and the patient will be contacted at home for this study.  He is to see Nathen May, M.D., F.A.C.C., on November 05, 2001, at 2:15 p.m.  He is to have a P.A. visit on August 09, 2001, at 10:15 a.m.  He will see Bruce R. Juanda Chance, M.D., in about six weeks.  This is to be scheduled at his next  office visit. He is to see Corwin Levins, M.D., as needed or as scheduled.   PROBLEM LIST AT THE TIME OF DISCHARGE: 1. Non-Q-wave myocardial infarction with stent to the right coronary artery    and percutaneous transluminal coronary angioplasty of the circumflex    performed on the day of admission.  Ejection fraction 60%. 2. Second degree atrioventricular block.  The patient may need a permanent    pacemaker. 3. Decreased platelet count.  Further follow-up in the office. 4. Right lower extremity of uncertain etiology to be monitored on an    outpatient basis. 5. Mildly abnormal chest x-ray which will need follow-up. 6. History of hypertension. 7. Elevated lipids. 8. Previous coronary  artery bypass surgery in 1992 with grafts as noted above    with previous interventions. 9. Mild hypotension.  In the office, the patient will need a follow-up chest x-ray to evaluate the abnormal chest x-ray he had in the hospital.  He will need a CBC to further monitor his platelet count.  As noted, he is to be set up for a 24-hour Holter to further evaluate his rhythm.  He is to have an exercise Cardiolite when the edema in his leg has resolved.  The Dopplers were negative for DVT.  He will need to be scheduled for a follow-up visit with Bruce R. Juanda Chance, M.D., in approximately four to six weeks. Dictated by:   Delton See, P.A. Attending Physician:  Daisey Must DD:  07/30/01 TD:  07/30/01 Job: [id number]

## 2010-06-17 NOTE — Discharge Summary (Signed)
NAME:  [name], [name]                        ACCOUNT NO.:  [id number]   MEDICAL RECORD NO.:  [id number]                   PATIENT TYPE:  INP   LOCATION:  2906                                 FACILITY:  [facility]   PHYSICIAN:  Doylene Canning. Ladona Ridgel, M.D.               DATE OF BIRTH:  08/30/1945   DATE OF ADMISSION:  12/01/2002  DATE OF DISCHARGE:  12/03/2002                           DISCHARGE SUMMARY - REFERRING   PROCEDURE:  1. Cardiac catheterization.  2. Coronary arteriogram.  3. Left ventriculogram.  4. Graft angiogram.  5. Percutaneous transluminal coronary angioplasty and stent to one vessel.   HOSPITAL COURSE:  [name] is a 65 year old male with known coronary  artery disease.  He had bypass surgery in 1991 and has had multiple  percutaneous interventions.  He had onset of his typical substernal chest  pain at 5/10 and came to the emergency room where he was admitted for  further evaluation and treatment.   His CK-MB was significantly elevated at 432/20.4 at its peak.  His troponins  were all completely normal.  Cardiac catheterization was indicated and it  was performed on December 01, 2002.   The cardiac catheterization showed a culprit lesion from the SVG to the PDA.  This was treated with PTCA and Sypher stent.  His EF was normal at  approximately 60%.  The LIMA to LAD was patent and a distal stent was  patent.  Circumflex has severe disease but the OM was patent at a previous  stent and the RCA stents were patent although there was some end-stent  restenosis.   Postcatheterization, he was doing well.  He was seen by cardiac  rehabilitation and ambulated up to 468 feet without chest pain or shortness  of breath.  He was referred to outpatient cardiac rehabilitation.  By  December 03, 2002, he was ambulated without chest pain and shortness of  breath and his labs were without abnormalities.  He was considered stable  for discharge with outpatient follow up arranged.   LABORATORY DATA:  Sodium 141, potassium 4.3, chloride 107, CO2 27, BUN 12,  creatinine 0.9, and glucose 145.  Total cholesterol 165, triglycerides 151,  HDL 44, LDL 91.   Chest x-ray showed decreased lung volume with bibasilar atelectasis.  Dual  pacemaker leads in good positions.   CONDITION ON DISCHARGE:  Improved.   DISCHARGE DIAGNOSES:  1. Acute myocardial infarction with percutaneous transluminal coronary     angioplasty and Sypher stent to the saphenous vein graft to posterior     descending artery.  2. Status post aortocoronary bypass surgery in 1991 with left internal     mammary artery to left anterior descending artery, saphenous vein graft     to posterior descending artery.  3. 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  4. Hyperlipidemia.  5. Hypertension.  6. Remote history of tobacco  use.  7. Family history of premature coronary artery disease.  8. History of noncompliance with medication secondary to financial reasons.   DISCHARGE INSTRUCTIONS:  His activity level is to include no driving for  five days and no strenuous activity until cleared by doctor.  He is to call  the office for problems with the catheter site.  He is to follow up with Dr.  Jonny Collier as needed.  He is to follow up with Dr. Emilie Rutter Collier on December 17, 2002 at 10:45 a.m.   DISCHARGE MEDICATIONS:  1. Aspirin 325 mg q.d.  2. Multivitamin q.d.  3. Plavix 75 mg q.d.  4. Lipitor 80 mg 1/2 tablet q.d.  5. Altace 2.5 mg q.d.  6. Imdur 30 mg q.d.  7. Lasix 20 mg q.d.  8. K-Dur 20 mEq q.d.  9. Toprol XL 50 mg q.d.      [name], P.A. [facility]                  Doylene Canning. Ladona Ridgel, M.D.    RG/MEDQ  D:  12/03/2002  T:  12/03/2002  Job:  [id number]   cc:   Corwin Levins, M.D. [facility]   Carole Binning, M.D. [facility]

## 2010-06-17 NOTE — Consult Note (Signed)
[place]. [facility]  Patient:    Randall, Collier Visit Number: [id number] MRN: [id number]          Service Type: MED Location: 2000 2006 01 Attending Physician:  Daisey Must Dictated by:   Nathen May, M.D., [facility] Admit Date:  07/26/2001 Discharge Date: 07/30/2001   CC:         Corwin Levins, M.D. [facility]  [facility]  Electrophysiology laboratory   Consultation Report  Thank you very much for asking me to see [name] in electrophysiology consultation for second-degree heart block and sinus bradycardia.  The patient is a 65 year old gentleman with a history of ischemic heart disease. He is status post bypass surgery in 2002 with subsequent PCI of nonbypassed vessels including his intermediate and RCA.  He presented to the hospital last week with recurrent chest pain and a nonQ-wave MI and underwent repeat PCI of his RCA in-stent disease and his intermediate in-stent disease.  He has residual post insertion LAD disease with a patent, but tortuous LIMA.  His ejection fraction is normal.  He has a history of sinus node dysfunction and sinus bradycardia.  When he was here in October at the time of one of his PCI procedures, he had both significant first degree AV block as well as second degree heart block type 1 noted.  This resolved prior to discharge.  He had positive, albeit, low level positive enzymes at that time as well.  He has noted progressive exercise intolerance and is now short of breath at approximately 100 yards and two to three steps.  While in hospital at this time, again, is noted his following nonQ-wave myocardial infarction, he has had significant problems with sinus node dysfunction with bradycardia as well as second degree AV block.  He has also had significant first-degree AV block with PR intervals ranging from 180-500 milliseconds.  The patient denies any history of syncope.  He does have  exercise intolerance, as noted.  He also has recurrent episodes of tachy palpitations that are frog-negative, but diuretic positive.  These are occurring a couple of times per year lasting one to two minutes, and are associated with presyncope.  PAST MEDICAL HISTORY:  Notable for hyperlipidemia, hypertension.  PAST SURGICAL HISTORY:  As noted above.  REVIEW OF SYSTEMS:  Noncontributory.  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  He does not smoke or use alcohol or recreational drugs.  MEDICATIONS AT PRESENT:  No obvious heart rate slowing drugs.  He is on aspirin, Plavix, Altace on hold.  ALLERGIES:  He has no known drug allergies.  PHYSICAL EXAMINATION  GENERAL:  He is in moderate distress because of complaints of his right foot swollen.  VITAL SIGNS:  Blood pressure 116/53, pulse 58, respirations 16 and unlabored.  HEENT:  No scleral icterus or xanthomata.  NECK:  There is no lymphadenopathy.  His neck veins were flat and his carotids were brisk bilaterally without bruits.  BACK:  Without kyphosis, scoliosis.  LUNGS:  Clear.  HEART:  Sounds were regular without murmurs or gallops.  ABDOMEN:  Soft and bowel sounds were active.  EXTREMITIES:  Femoral pulses were not palpated.  Distal pulses were intact on the left and his right leg was swollen up to at least the knee and there was rubor.  LABORATORIES:  Unrevealing apart from as noted above.  D-dimer evaluation today is minimally abnormal.  Lower extremity Dopplers of  the right lower extremity are negative today.  IMPRESSION: 1. Bradycardia with    a. Sinus node dysfunction.    b. Mobitz I second-degree AV block.    c. PR interval ranging from 200-500 milliseconds.  All of these findings       were present now as well as previously in October in the setting of       another acute coronary syndrome event. 2. Ischemic heart disease.    a. Status post bypass.    b. Status  post native PCI intermediate and right coronary artery with       in-stent restenosis and repeat PCI.    c. Good ejection fraction.    d. Acute nonQ-wave myocardial infarction. 3. Tachy palpitations over the last five years a couple of times per year.    Question supraventricular tachycardia (probable AV nodal reentry). 4. Right lower extremity swelling, question mechanism.  The patient has transient sinus node and AV nodal conduction problems.  Given the temporal association both now and previously with his acute coronary syndrome, I suspect it is secondary and whether it needs intervention I think remains to be determined.  I would recommend that we undertake ongoing monitoring both as an inpatient and as an outpatient and I would also consider, if monitoring is not clarifying, undertaking stress testing to see whether he has progressive conduction system disease with exercise that might be limiting his exercise performance.  In addition, in the event that he has prolonged tachy palpitations, obtaining an electrocardiographic record of these would be very important, but I suspect the patient has SVT and would benefit from event monitoring plus/minus EP study and RF ablation.  This may need to be done without full clarification of the arrhythmia given his propensity to bradycardia which would limit the ability to use medications empirically. Dictated by:   Nathen May, M.D., [facility] Attending Physician:  Daisey Must DD:  07/29/01 TD:  07/31/01 Job: [id number]

## 2010-06-17 NOTE — Cardiovascular Report (Signed)
[place]. [facility]  Patient:    [name], [name] Visit Number: [id number] MRN: [id number]          Service Type: MED Location: 3700 3714 01 Attending Physician:  Daisey Must Dictated by:   Arturo Morton. Riley Kill, M.D. [facility] Proc. Date: 11/29/00 Admit Date:  11/28/2000                          Cardiac Catheterization  INDICATIONS: The patient underwent percutaneous intervention yesterday by Dr. Gerri Spore. He had an OM dilated that involved a bifurcational stenosis. He also had the RCA stented. He developed mild enzyme elevation as well as some recurrent chest pain and as a result was brought back to the catheterization lab for further evaluation.  PROCEDURES: Coronary arteriography from the left femoral artery.  DESCRIPTION OF PROCEDURE: The procedure was performed using 6 French sheath and 6 French catheters. An RCA was used to engage the right coronary artery. A standard left 4 was used to engage the left coronary artery. The patient tolerated the procedure without complication.  ANGIOGRAPHIC DATA:  The ramus intermedius vessel showed no renarrowing at the stent site and at the bifurcation there was about 30% narrowing in one limb and 30-40% narrowing in another limb. Both had TIMI-3 flow and appeared to be widely patent. There was a small AV circumflex that had 90% narrowing and then totally occluded similar to the previous study yesterday.  The right coronary artery demonstrates widely patent stent in the proximal right coronary. The distal vessel has about 20% narrowing.  The remainder of the vessel is free of disease.  CONCLUSIONS: 1. Continued patency of the previously placed right coronary stent. 2. Continued patency of the bifurcational stenosis with TIMI-3 flow. Dictated by:   Arturo Morton Riley Kill, M.D. [facility] Attending Physician:  Daisey Must DD:  11/29/00 TD:  11/30/00 Job: [id number]

## 2010-06-17 NOTE — Assessment & Plan Note (Signed)
[facility]                              CARDIOLOGY OFFICE NOTE   [name], [name]                     MRN:          [id number]  DATE:10/13/2005                            DOB:          30-Nov-1945    CLINICAL HISTORY:  [name] is 65 years old and has had prior bypass  surgery and multiple percutaneous coronary interventions.  He had mostly  nonobstructive disease at last cath but has had persistent angina.  We  started him on Ranexa which has caused a great improvement.  He has been  doing fairly well now.  Has had no recent chest pain or palpitations.  He  does get short of breath but he attributes this to a possible increase in  his weight.   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  He also has hypertension and  hyperlipidemia.   CURRENT MEDICATIONS:  Ranexa, aspirin, Plavix, enalapril, Norvasc, Actos,  Lasix, K-Dur, multivitamin, __________, Imdur, Zocor, vitamin C.   ON EXAMINATION TODAY:  VITAL SIGNS:  Blood pressure 122/76.  Pulse 70 and  regular.  NECK:  There was no venous distention.  Carotid pulses are full without  bruits.  CHEST:  Clear.  CARDIAC:  Rhythm was regular.  No murmurs or gallops.  ABDOMEN:  Soft.  No organomegaly.  EXTREMITIES:  Peripheral pulses were full.  There is no peripheral edema.   Electrocardiogram showed atrial tracking with ventricular pacing.  Interrogation of his pacemaker showed good thresholds in both leads.  He is  atrial tracking and ventricular pacing most of the time.   IMPRESSION:  1. Chronic stable angina improved with Ranexa therapy.  2. Coronary artery disease, status post coronary artery bypass grafting in      1992 and status  post multiple percutaneous coronary interventions with      mostly nonobstructive disease at last cath.  3. Good left ventricular function.  4. Status post DDD pacemaker implantation for  sick sinus syndrome and AV      block with good pacer function.  5. Hypertension.  6. Hyperlipidemia.   RECOMMENDATIONS:  I think [name] is doing well.  We will see if we can  get some help with both the Plavix and the Ranexa drugs.  Will continue his  medications at present.  Will see him back in four months.                               Bruce Elvera Lennox Juanda Chance, MD, [facility]    BRB/MedQ  DD:  10/13/2005  DT:  10/14/2005  Job #:  [id number]

## 2010-06-17 NOTE — Assessment & Plan Note (Signed)
[facility]                              CARDIOLOGY OFFICE NOTE   WILLIAN, DONSON                     MRN:          [id number]  DATE:08/30/2005                            DOB:          September 08, 1945    SUBJECTIVE:  Mr. Surrette is a very pleasant 65 year old male patient who was  recently admitted from the office to [facility]. [facility] for  chest pain worrisome for unstable angina pectoris.  He ruled out for  myocardial infarction and underwent cardiac catheterization by Dr. Juanda Chance.  This revealed severe native vessel disease with total occlusion of the LAD,  70% ostial stenosis of the circumflex with 0% stenosis at the stent site in  the mid circumflex, 50% focal narrowing within the stent in the proximal and  mid RCA and normal LV function.  There was no clear culprit lesion to  explain his symptoms and he was started on Ranexa as an antianginal.  He has  already come back to the office for a follow-up ECG that revealed a QTC of  398 msec on August 18, 2005.  It was reviewed by Dr. Jens Som.  He returns to  the office today for follow-up post hospitalization.  He is doing well  without any further chest pain.  He seems to be happy on the Ranexa.  He  does have dyspnea on exertion.  This has been a chronic symptom for him and  it is only slightly better.  He denies any syncope or presyncope.   CURRENT MEDICATIONS:  1.  Aspirin 325 mg daily.  2.  Plavix 75 mg daily.  3.  Enalapril 10 mg b.i.d.  4.  Norvasc 10 mg daily.  5.  Actos 30 mg daily.  6.  Lasix 40 mg daily.  7.  Potassium 20 mEq daily.  8.  Multivitamin daily.  9.  Proscar 5 mg daily.  10. Imdur 60 mg daily.  11. Zocor 80 mg daily.  12. Vitamin C 500 mg daily.  13. Ranexa 500 mg two tablets daily.  14. Nitroglycerin p.r.n.   ALLERGIES:  CRESTOR.   PHYSICAL EXAMINATION:  GENERAL APPEARANCE:  He is a well-developed, well-  nourished male in no acute distress.  VITAL  SIGNS:  Blood pressure 101/66, pulse 88, weight 258 pounds.  HEENT:  Unremarkable.  NECK:  Without VD.  LUNGS:  Clear to auscultation bilaterally without wheezing, rhonchi or  rales.  CARDIOVASCULAR:  Normal S1 and S2.  Regular rate and rhythm without murmurs,  rubs, gallops, or clicks.  ABDOMEN:  Soft, nontender with normal active bowel sounds.  No organomegaly.  EXTREMITIES:  Without edema.  Calves soft and nontender.  Right groin  without hematomas or bruits.   IMPRESSION:  1.  Chronic stable angina - improved on Ranexa.  2.  Coronary artery disease.      1.  Status post coronary artery bypass grafting with known occlusion of          the vein graft to the circumflex.      2.  History of stenting to the right coronary artery  and mid circumflex.      3.  Catheterization August 10, 2005, revealing occluded left anterior          descending, 70% ostial stenosis of the circumflex, no in-stent          restenosis in the mid circumflex and 50% stenosis in the proximal          and mid right coronary artery.  3.  Good left ventricular function.  4.  Treated dyslipidemia.  5.  Diabetes mellitus type 2.  6.  Hypertension.  7.  Status post permanent pacemaker implantation secondary to AV block.  8.  History of prostate cancer.  9.  Remote history of thrombocytopenia.   PLAN:  Patient doing well from post hospitalization standpoint.  He is chest  pain-free on Ranexa now.  I have asked him to take this 500 mg one tablet  twice a day instead of two tablets all together in the morning.  His QTC was  not prolonged at his EKG on August 18, 2005.  We will not make any further  medication changes at this point in time.  He will follow up with Dr. Juanda Chance  in about 4-6 weeks' time.  I have provided him with samples of Ranexa.                                  Tereso Newcomer, PA-C                        Cecil Cranker, M.D., F.A.C.C.   SW/MedQ  DD:  08/30/2005  DT:  08/30/2005  Job #:   [id number]   cc:   Corwin Levins, MD

## 2010-06-17 NOTE — H&P (Signed)
[name], [name]              ACCOUNT NO.:  [id number]   MEDICAL RECORD NO.:  [id number]          PATIENT TYPE:  INP   LOCATION:  A201                          FACILITY:  [facility]   PHYSICIAN:  Margaretmary Dys, M.D.DATE OF BIRTH:  July 24, 1945   DATE OF ADMISSION:  10/29/2005  DATE OF DISCHARGE:  LH                                HISTORY & PHYSICAL   PRIMARY CARDIOLOGIST:  Everardo Beals. Juanda Chance, MD, [facility], [facility].   PRIMARY CARE PHYSICIAN:  Corwin Levins, MD.   ADMISSION DIAGNOSES:  1. Dyspnea and probable angina equivalent.  2. History of extensive coronary artery disease with chronic stable angina      with recent catheterization in July 2007.  3. Type 2 diabetes.  4. Morbid obesity.   CHIEF COMPLAINT:  Shortness of breath.   HISTORY OF PRESENT ILLNESS:  [name] is a 65 year old Caucasian male  with extensive coronary artery disease.  He presented to the emergency room  with complaint of shortness of breath which just started yesterday in the  morning.  He said the episode was fairly acute.  He did not take note of it  initially, but over time it has progressed to the point where he is unable  to ambulate without getting very short of breath.  He denies any wheezing.  He has no cough.  He has no chest pain.  The patient states the shortness of  breath is different as he does not clearly have shortness of breath when he  gets the chest pain.  His wife also noticed that he was a little bit flushed  when the shortness of breath started.  He denies any leg swelling out of the  ordinary.  He denies any fevers or chills, and clearly the symptoms were  exacerbated by exercise.  The patient is admitted for further evaluation and  management.   The ED physician, Dr. Rosalia Hammers, called the cardiologist on call at [facility] who  felt that, since patient had a cardiac catheterization only in July 2007 and  patient noted to have chronic stable angina with medical therapy  recommended,  there was no indication for a transfer at this time.  The  patient will now be admitted to our hospital to rule out.   REVIEW OF SYSTEMS:  A 10-point Review of Systems is otherwise negative  except as mentioned in History of Present Illness.   PAST MEDICAL HISTORY:  1. Coronary artery bypass grafting with stenting with vein graft to the      OM.  2. He had stenting to the native RCA prior to his bypass.  3. Catheterization May 18, 2005, showed 2-vessel coronary artery disease      and a 30% in-stent restenosis in the RCA.  The LIMA to the LAD was      patent.  The vein graft to the circumflex was totally occluded,      ejection fraction 55-60% with inferobasal hypokinesis.  Medical therapy      and nitrates were adjusted. He had another catheterization August 10, 2000, by Dr. Juanda Chance which  showed him to have a 70% proximal circumflex.      There was no evidence of restenosis in the native circumflex stent.  He      had a 50% in-stent stenosis and 40% mid LAD at the saphenous vein graft      to the LAD anastomosis.  Saphenous vein graft to the circumflex was      totaled.  Ejection fraction was 60%.  Dr. Juanda Chance at that time      recommended continued medical therapy.  4  Hyperlipidemia.  5  Type 2 diabetes.  1. Hypertension.  2. Status post pacemaker secondary to AV block.  3. Prostate cancer.  4. History of thrombocytopenia.   MEDICATIONS:  1. Ranexa 500 mg p.o. b.i.d.  2. Plavix 75 mg once a day.  3. Aspirin 325 mg p.o. once a day.  4. Enalapril 10 mg p.o. b.i.d.  5. Norvasc 10 mg p.o. once a day.  6. Actos 30 mg p.o. once a day.  7. Lasix 40 mg p.o. once a day.  8. Potassium 20 mEq p.o. once a day.  9. Multivitamins 1 p.o. daily.  10.Proscar 5 mg p.o. daily.  11.Zocor 80 mg p.o. nightly.  12.Nitroglycerin 0.4 mg as needed.  13.Xanax as needed.  14.Vicodin as needed.  15.Imdur 60 mg p.o. once a day.   ALLERGIES:  No known drug allergies.   FAMILY HISTORY:  Positive  for hypertension and coronary artery disease.   SOCIAL HISTORY:  The patient is married.  He quit smoking years ago.  Does  not drink alcohol.  He is active and has resumed running on a treadmill  recently.  The patient has had chronic stable angina which spontaneously  resolves with nitroglycerin.  He did not take any nitroglycerin today.   PHYSICAL EXAMINATION:  GENERAL:  Conscious, alert, comfortable, pleasant,  not in acute distress.  VITAL SIGNS:  On arrival in the emergency room, blood pressure 99/68, pulse  81, respirations 28, temperature 97.6, oxygen saturation 95% on room air.  HEENT:  Normocephalic and atraumatic.  Oral mucosa was moist with no  exudates.  NECK:  Supple.  No JVD, no lymphadenopathy.  LUNGS:  Clear clinically with good air entry bilaterally.  HEART: S1, S2 regular.  No S3, S4, gallops, or rubs.  ABDOMEN:  Soft, obese, nontender.  Bowel sounds positive.  No masses  palpable.  EXTREMITIES:  Trace pitting pedal edema bilaterally.  Previous graft harvest  on the right leg unremarkable.   LABORATORY AND DIAGNOSTIC DATA:  Blood gas shows a pH of 7.41, pCO2 of 37,  pO2 of 67, oxygen saturation 94.% on room air.  White blood cell count 6.5,  hemoglobin 14.1, hematocrit 41, platelet count 201 with no left shift.  PT  30.6, INR 1.0.  D-dimer was negative at 0.43.  Sodium 135, potassium 4.5,  chloride 104, CO2 21, glucose 155, BUN 19, creatinine 1.1.  AST 33, ALT 42,  calcium 9.3. CQ 452.  CK-MB 15.1, relative index 3.3, troponin I negative.  B-natruretic peptide was 55.4.   Chest x-ray shows low lung volumes, but no acute infiltrate was seen.   A 12-lead EKG shows a paced rhythm.   ASSESSMENT AND PLAN:  [name] is a 65 year old Caucasian man with  extensive history of coronary artery disease who presented with a 2-day  history of acute onset of shortness of breath.  The etiology remains unclear.  The patient had a screening D-dimer in the  emergency room  which was negative, making it less likely to be a pulmonary  embolus.  The patient has extensive cardiac history of angina equivalent.  The patient will be admitted to rule out for a myocardial infarction.  Will  resume all his home medications at this time.   We will request cardiology to see him in the morning.  We will cycle cardiac  enzymes.   Type 2 diabetes.  Blood sugars are in fairly satisfactory range.  We will  put him on a sliding scale.   If patient remains short of breath with no clear etiology, I think we may  proceed with a spiral CT scan to rule out pulmonary embolus or a  primary  lung disease.   Code status: The patient is a full code.   I discussed the above plan with him, and he verbalized understanding.      Margaretmary Dys, M.D.  Electronically Signed     AM/MEDQ  D:  10/29/2005  T:  10/29/2005  Job:  [id number]   cc:   Everardo Beals. Juanda Chance, MD, [facility]  [address]  [place], [place] [address]   Corwin Levins, MD  [address]  [place]  [place] [address]

## 2010-06-17 NOTE — H&P (Signed)
NAME:  [name], [name]                        ACCOUNT NO.:  [id number]   MEDICAL RECORD NO.:  [id number]                   PATIENT TYPE:  INP   LOCATION:  1826                                 FACILITY:  [facility]   PHYSICIAN:  Olga Millers, M.D. [facility]            DATE OF BIRTH:  02/14/1944   DATE OF ADMISSION:  10/06/2003  DATE OF DISCHARGE:                                HISTORY & PHYSICAL   REASON FOR ADMISSION:  The patient is a 65 year old male, with known  coronary artery disease, who presents to [facility] Emergency Room with  complaint of progressive dyspnea and chest pain over the past several days.   The patient developed worsening chest pain yesterday morning, after  breakfast, requiring intermittent use of sublingual nitroglycerin.  However,  he continued to have recurrent pain throughout the day.  His symptoms are  similar to those of his previous hospitalization.  They are described as an  initial bee sting, with radiation to the left upper extremity down to the  elbow.  He notes associated dyspnea and diaphoresis but no nausea or  vomiting.   This morning, he was described as ashen and clammy by his wife and was thus  taken to the emergency room.  He did take his morning medications, but did  not take any nitroglycerin.  On presentation, electrocardiogram revealed  atrial sensing with ventricular pacing.  The patient was reporting mild  residual pain.  He states that he has chronic daily chest pain, but that  these symptoms have worsened over these past two days.   ALLERGIES:  No known drug allergies.   MEDICATIONS PRIOR TO ADMISSION:  1.  Lipitor 80 mg daily.  2.  Plavix 75 mg daily.  3.  Altace 2.5 mg daily.  4.  Aspirin 325 mg daily.  5.  Toprol 50 daily.  6.  Lasix 20 daily.  7.  Potassium 20 daily.  8.  Potassium 20 daily.  9.  Imdur 30 daily.   PAST MEDICAL HISTORY:  1.  Coronary artery disease.      1.  Coronary artery bypass graft in 1992.      2.   Non-STE  myocardial infarction/unsuccessful PCI of new 100% SFG-OM          stenosis November of 2004.      3.  Status post prior stent right coronary artery, (60% in stent          restenosis 11/04).      4.  Status post stent SVG-OM graft 11/04.  2.  Normal left ventricular function.  3.  Sick sinus syndrome/AV block.      1.  Status post [employer], (DDD), pacemaker implantation.  4.  Hypertension.  5.  Dyslipidemia.  6.  Diabetes mellitus, (diet).  7.  Left upper extremity/right hand surgery.  8.  History of nephrolithiasis.   SOCIAL HISTORY:  Denies alcohol or tobacco use.  FAMILY HISTORY:  Noncontributory.   REVIEW OF SYSTEMS:  Reports worsening exertional dyspnea, orthopnea,  paroxysmal nocturnal dyspnea, and lower extremity edema.  Denies any recent  evidence of upper or lower GI bleeding.  The remaining systems negative.   PHYSICAL EXAMINATION:  VITAL SIGNS:  Blood pressure 113/82, pulse 66,  regular respirations 22, temperature 97, SIO2 100%, (RA).  GENERAL: A 65 year old male in no acute distress.  HEENT:  Normocephalic, atraumatic.  NECK:  Preserved bilateral carotid pulses without bruits.  LUNGS:  Clear to auscultation in all fields.  HEART:  Regular rate and rhythm, (S1-S2), with diminished heart sounds.  ABDOMEN:  Protuberant, nontender.  Intact bowel sounds.  EXTREMITIES:  Preserved bilateral femoral pulses both upper extremities;  intact distal pulses bu trace pedal edema.  NEUROLOGICAL:  No focal deficits.   Chest x-ray:  Pending.   Electrocardiogram:  Atrial sensing ventricular pacing is 63 beats per  minute.   A pacemaker interrogation:  No atrial high rate episodes; AV delay extended  to allow intrinsic conduction.   LABORATORY DATA:  Hemoglobin 14.5, hematocrit 42, WBC 546, platelets  167,000.   IMPRESSION:  1.  Unstable angina pectoris.  2.  Known coronary artery disease.      1.  Coronary artery bypass graft positive in 1992.      2.  Non-ST  myocardial infarction/unsuccessful PCI, 100% saphenous vein          graft-obtuse marginal graft, November of 2004; patent LIMA-LAD; 60%          in stent right coronary artery stenosis.      3.  Normal left ventricular function.  3.  Diabetes mellitus.  4.  Hypertension.  5.  Dyslipidemia.  Plan:  The patient will be admitted for evaluation and      treatment of unstable angina pectoris.  We will add intravenous      nitroglycerin and heparin and continue current home medications except      Imdur.  In addition to routine blood work, we will check a BNP level.      Recommendations to proceed with diagnostic coronary angiography.      Risks/benefits have been discussed and the patient is agreeable to      proceed.  6.  Remote tobacco.  7.  Sick sinus syndrome/permanent pacemaker.  Plan:  The patient presents      with both typical/atypical symptoms, multiple cardiac risk factors, and      nondiagnostic electrocardiogram secondary to ventricular pacing.      Therefore, the recommendation is to admit for further cycling of markers      and proceed with diagnostic coronary      angiography.  Risks/benefits have been discussed and the patient is      willing to proceed.  In addition to current home medications, we will      start intravenous nitroglycerin and heparin and discontinue Imdur.  In      addition to routine laboratories, we will add a BNP level as well.      Gene Serpe, P.A. [facility]                      Olga Millers, M.D. [facility]    GS/MEDQ  D:  10/06/2003  T:  10/06/2003  Job:  [id number]   cc:   Corwin Levins, M.D. [facility]

## 2010-06-17 NOTE — Cardiovascular Report (Signed)
NAMEHASSAAN, [name] NO.:  [id number]   MEDICAL RECORD NO.:  [id number]          PATIENT TYPE:  OIB   LOCATION:  1963                         FACILITY:  [facility]   PHYSICIAN:  Charlies Constable, M.D. [facility] DATE OF BIRTH:  15-Apr-1945   DATE OF PROCEDURE:  05/18/2005  DATE OF DISCHARGE:  05/18/2005                              CARDIAC CATHETERIZATION   CLINICAL HISTORY:  [name] is 65 years old and has had prior bypass  surgery.  Has had prior occlusion to the vein graft to the circumflex  artery.  Has chronic angina.  He also has a pacer for AV block.  Recently he  has developed increasing symptoms of chest pain and we did a Cardiolite scan  on him and this suggested anterior ischemia, which is new from his prior  studies.  For this reason we brought him into the outpatient laboratory for  evaluation with angiography.   PROCEDURE:  The procedure was performed via the right femoral artery using  an arterial sheath and 4 [place] preformed catheters.  A front wall arterial  puncture was performed and Omnipaque contrast was used.  The LIMA was  injected with the JR-4 diagnostic right catheter.  The patient tolerated the  procedure well and left the laboratory in satisfactory condition.   RESULTS:  1.  Left main coronary artery:  The left main coronary artery was free of      significant disease.  2.  Left anterior descending artery:  The left anterior descending artery      was completely occluded at its origin.  3.  Left circumflex artery:  The left circumflex artery gave rise to a large      ramus branch and then had an ostial lesion of 70% and then fed a small      marginal and two posterolateral branches.  The distal vessel was      severely diseased on prior studies but looked only irregular on the      present study with no serious obstruction.  4.  Right coronary artery:  The right coronary artery was a dominant vessel      that gave rise to a right ventricular  branch, a large posterior      descending branch and two posterolateral branches.  There was a long      stent in the midvessel that I think was an overlapping stent that had a      30% focal narrowing in the midportion.  There were irregularities distal      but no significant obstruction.  5.  The saphenous vein graft to the circumflex artery was completely      occluded at its origin.  6.  The LIMA graft to the LAD was patent and functioned normally.  The LAD      developed two septal perforators and a diagonal branch retrograde and      the distal LAD antegrade.  There was no major obstruction in the LAD      itself.  7.  Left ventriculogram:  The left ventriculogram performed in the RAO  projection showed minimal hypokinesis of the inferobasilar segment.  The      overall wall motion was good with an estimated ejection fraction of 55-      60%.   CONCLUSION:  1.  Coronary artery disease, status post prior coronary artery bypass graft      surgery.  2.  Severe native vessel disease with total occlusion of the left anterior      descending artery, a 70% ostial stenosis of the circumflex artery, with      irregularities in the distal circumflex artery and 30% focal in-stent      renarrowing in the stent in the mid-right coronary artery.  3.  Patent left internal mammary artery graft to the left anterior      descending artery and occluded vein graft to the circumflex artery      (old).  4.  Minimal inferobasal wall hypokinesis with an estimated ejection fraction      of 55-60%.   RECOMMENDATIONS:  The abnormality in the anterior wall on Cardiolite scan  does not correlate with any obstruction in the LIMA graft or the LAD.  The  circumflex artery actually looks quite a bit better in its distal portion  than it did before.  He has potential ischemia in the circumflex  distribution, but the ostial lesion actually does not appear critical.  Will  plan reassurance and continued  medical therapy.  We will increase his Imdur  from 60 mg to 90 mg and we will see him back in follow-up in the office in  about four weeks.           ______________________________  Charlies Constable, M.D. [facility]     BB/MEDQ  D:  05/18/2005  T:  05/19/2005  Job:  [id number]   cc:   Corwin Levins, M.D. [facility]  [address]  [place]  [place] [address]

## 2010-06-17 NOTE — Consult Note (Signed)
[name], [name]              ACCOUNT NO.:  [id number]   MEDICAL RECORD NO.:  [id number]          PATIENT TYPE:  INP   LOCATION:  A201                          FACILITY:  [facility]   PHYSICIAN:  Gerrit Friends. Dietrich Pates, MD, FACCDATE OF BIRTH:  1945-08-21   DATE OF CONSULTATION:  10/30/2005  DATE OF DISCHARGE:  10/31/2005                                   CONSULTATION   REFERRING PHYSICIAN:  Dr. Rito Ehrlich.   PRIMARY CARDIOLOGIST:  Dr. Juanda Chance.   HISTORY OF PRESENT ILLNESS:  A 65 year old gentleman with long complex  history of coronary artery disease presents with progressive dyspnea on  exertion and dyspnea at rest.  [name] cardiac history dates to 74  when he underwent CABG surgery involving a LIMA to the LAD and a saphenous  vein graft to the circumflex.  He did well for a number of years, but  subsequently has been admitted to hospital on many occasions with chest pain  and/or non-Q myocardial infarction room.  Intervention in the right coronary  artery has been performed with repeat intervention record required for  restenosis.  Stent has also been placed in the mid circumflex after the  graft to the circumflex was found to be totally occluded a few years ago.  He has had a 70% ostial lesion in the circumflex that has not been thought  to be appropriate for intervention.  His most recent catheterization was in  July at which time the LAD was totally occluded, the 70% circumflex lesion  was again identified and there was a 50% restenosis in the stent to the RCA.  The LIMA to the LAD was widely patent with a 40% lesion in the mid-LAD  beyond the graft insertion.  His most recent stress nuclear study was in  April at which time he had anterior ischemia, which was not found to be  consistent with his anatomy.   The patient is somewhat unclear as to the temporal course of his current  symptomatology.  It sounds as if he has had some progressive dyspnea on  exertion.  The evening  prior to admission, he developed dyspnea at rest  along with paresthesias that he felt diffusely.  This resolved spontaneously  after some time.  The following day in church, his symptoms recurred  prompting transport to the emergency department.  Initial blood gas showed a  normal pH and pCO2 with a pO2 of 67 on room air.  There were no important  findings on chest x-ray.  CPK has been elevated with borderline CPK-MB but  entirely normal troponins.  Lipid profile is excellent.  Chemistry profile  and CBC are normal.  EKG is paced.   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  He also has hypertension that has  been well-controlled and hyperlipidemia.   PAST MEDICAL HISTORY:  Otherwise notable for history of carcinoma of the  prostate and thrombocytopenia.   ALLERGIES:  THE PATIENT HAS NO KNOWN ALLERGIES.   MEDICATIONS ON ADMISSION:  1. Ranexa 500 mg b.i.d.  2.  Clopidogrel 75 mg daily.  3. Aspirin 325 mg daily.  4. Enalapril 10 mg b.i.d.  5. Amlodipine 10 mg daily.  6. Actos 30 mg daily.  7. Furosemide 40 mg daily.  8. KCl 20 mEq daily.  9. Multivitamin.  10.Proscar 5 mg daily.  11.Simvastatin 80 mg daily.  12.Sublingual nitroglycerin p.r.n.  13.Xanax p.r.n.  14.Vicodin p.r.n.  15.Imdur 60 mg daily.   FAMILY HISTORY:  The patient has a number of first-degree relatives with  hypertension and coronary disease.   SOCIAL HISTORY:  Former tobacco use; discontinued some years ago; no  excessive use of alcohol.  Married and lives locally.   PHYSICAL EXAMINATION:  GENERAL APPEARANCE:  Pleasant gentleman in no acute  distress.  VITAL SIGNS:  The heart rate is 80 and regular, blood pressure 115/65,  respirations 20, temperature 97.6.  O2 saturation 95% on room air.  HEENT:  Anicteric sclerae; normal lids and conjunctivae.  NECK:  No jugular venous distension; normal carotid upstrokes without  bruits.   ENDOCRINE:  No thyromegaly.  HEMATOPOIETIC:  No adenopathy.  SKIN:  No significant lesions.  LUNGS:  Clear.  CARDIAC:  Normal first and second heart sounds; fourth heart sound present.  ABDOMEN:  Soft and nontender; no organomegaly; no bruits.  EXTREMITIES:  Trace edema; distal pulses intact.  NEUROMUSCULAR:  Symmetric strength and tone; normal cranial nerves.  MUSCULOSKELETAL:  No joint deformity.   IMPRESSION:  [name] has typically presented with ischemic chest  discomfort.  Although he describes some sort of history of chronic dyspnea,  this has not been reported prominently in multiple previous notes.  He did  have abnormal CPK and borderline CPK-MB on admission but negative troponins.  This would appear to rule out myocardial infarction.  It raises substantial  concern regarding pulmonary embolism.  His D-dimer level was borderline.  It  may be prudent to perform an imaging study to verify that there is no  evidence for embolism.   Likewise, B natriuretic peptide level was entirely normal.  Accordingly,  there is no evidence for congestive heart failure and of yet, no explanation  for his dyspnea.   An echocardiogram will be performed.  If significant left ventricular  dysfunction is present, this could account for his symptoms.  If there are  right ventricular abnormalities, this would tend to support a possible  diagnosis of  pulmonary embolism.  If pulmonary embolism is ruled out, [name] will  likely require stress testing or more likely coronary angiography once again  to rule out progression of coronary disease.  We greatly appreciate the  request for consultation and will be happy to follow this nice gentleman  with you.      Gerrit Friends. Dietrich Pates, MD, [facility]  Electronically Signed     RMR/MEDQ  D:  10/30/2005  T:  10/31/2005  Job:  [id number]

## 2010-06-24 ENCOUNTER — Other Ambulatory Visit: Payer: Self-pay | Admitting: Cardiovascular Disease

## 2010-06-28 ENCOUNTER — Other Ambulatory Visit: Payer: Self-pay | Admitting: Internal Medicine

## 2010-06-28 ENCOUNTER — Other Ambulatory Visit: Payer: Self-pay | Admitting: *Deleted

## 2010-06-28 DIAGNOSIS — I2510 Atherosclerotic heart disease of native coronary artery without angina pectoris: Secondary | ICD-10-CM

## 2010-06-28 MED ORDER — CARVEDILOL 12.5 MG PO TABS: 12.5000 mg | ORAL_TABLET | Freq: Two times a day (BID) | ORAL | Status: DC

## 2010-06-28 NOTE — Telephone Encounter (Signed)
Faxed hardcopy to pharmacy. 

## 2010-07-04 ENCOUNTER — Ambulatory Visit: Payer: Self-pay | Admitting: Internal Medicine

## 2010-07-16 ENCOUNTER — Encounter: Payer: Self-pay | Admitting: Internal Medicine

## 2010-08-10 ENCOUNTER — Telehealth: Payer: Self-pay

## 2010-08-10 ENCOUNTER — Encounter: Payer: Self-pay | Admitting: Internal Medicine

## 2010-08-10 ENCOUNTER — Ambulatory Visit (INDEPENDENT_AMBULATORY_CARE_PROVIDER_SITE_OTHER): Payer: Medicare Other | Admitting: Internal Medicine

## 2010-08-10 VITALS — BP 112/60 | HR 64 | Temp 98.9°F | Ht 71.0 in | Wt 235.0 lb

## 2010-08-10 DIAGNOSIS — E119 Type 2 diabetes mellitus without complications: Secondary | ICD-10-CM

## 2010-08-10 DIAGNOSIS — E785 Hyperlipidemia, unspecified: Secondary | ICD-10-CM

## 2010-08-10 DIAGNOSIS — I10 Essential (primary) hypertension: Secondary | ICD-10-CM

## 2010-08-10 DIAGNOSIS — I25810 Atherosclerosis of coronary artery bypass graft(s) without angina pectoris: Secondary | ICD-10-CM

## 2010-08-10 DIAGNOSIS — Z0000 Encounter for general adult medical examination without abnormal findings: Secondary | ICD-10-CM

## 2010-08-10 MED ORDER — HYDROCODONE-ACETAMINOPHEN 10-325 MG PO TABS: 1.0000 | ORAL_TABLET | Freq: Four times a day (QID) | ORAL | Status: DC | PRN

## 2010-08-10 NOTE — Progress Notes (Signed)
Subjective:    Patient ID: [name], male    DOB: 1945-02-24, 65 y.o.   MRN: [id number]  HPI  Here to f/u; overall doing ok,  Pt denies chest pain, increased sob or doe, wheezing, orthopnea, PND, increased LE swelling, palpitations, dizziness or syncope.  Pt denies new neurological symptoms such as new headache, or facial or extremity weakness or numbness   Pt denies polydipsia, polyuria, or low sugar symptoms such as weakness or confusion improved with po intake.  Pt states overall good compliance with meds, trying to follow lower cholesterol, diabetic diet, wt overall stable but little exercise however.  Pt continues to have recurring LBP without change in severity, bowel or bladder change, fever, wt loss,  worsening LE pain/numbness/weakness, gait change or falls. No acute complaints Past Medical History  Diagnosis Date  . 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 DIABETES MELLITUS, TYPE II 08/29/2006  . HYPERLIPIDEMIA 08/29/2006  . Acute gouty arthropathy 04/08/2008  . GOUT 04/22/2007  . Overweight 08/29/2006  . ERECTILE DYSFUNCTION 11/26/2006  . DEPRESSION 08/29/2006  . HYPERTENSION 08/29/2006  . CORONARY ARTERY DISEASE 08/29/2006  . 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 CONGESTIVE HEART FAILURE 08/29/2006  . DIASTOLIC HEART FAILURE, CHRONIC 06/09/2008  . ALLERGIC RHINITIS 11/26/2006  . BENIGN PROSTATIC HYPERTROPHY 08/29/2006  . KNEE PAIN, BILATERAL 04/21/2008  . NECK PAIN, LEFT 01/03/2010  . LUMBAR RADICULOPATHY, RIGHT 06/10/2007  . LEG PAIN, BILATERAL 04/21/2008  . DIZZINESS 08/13/2009  . INSOMNIA-SLEEP DISORDER-UNSPEC 10/23/2007  . Other malaise and fatigue 04/22/2007  . RASH-NONVESICULAR 01/14/2007  . PERIPHERAL EDEMA 10/22/2008  . DYSPNEA 06/10/2008  . DYSPNEA ON EXERTION 08/13/2009  . PSA, INCREASED 03/09/2009  . ANKLE SPRAIN 02/18/2007  . LACERATION, FINGER 09/07/2008  . PACEMAKER, PERMANENT 03/04/2008  . Left lumbar radiculopathy  05/30/2010   Past Surgical History  Procedure Date  . Coronary artery bypass graft 1992  . Permanent  pacemaker   . S/p 9 stents to coronaries per patient   . S/p left arm surgury after work accident 108  . S/p right hand surgury for foreign object     reports that he has quit smoking. He does not have any smokeless tobacco history on file. He reports that he does not drink alcohol or use illicit drugs. family history includes Coronary artery disease (age of onset:50) in his other; Diabetes in his mother, other, and sister; and Heart disease in his sister. Allergies  Allergen Reactions  . Rosuvastatin    Current Outpatient Prescriptions on File Prior to Visit  Medication Sig Dispense Refill  . allopurinol (ZYLOPRIM) 300 MG tablet Take 300 mg by mouth daily.        Marland Kitchen ALPRAZolam (XANAX) 0.25 MG tablet Take 0.25 mg by mouth 2 (two) times daily as needed.        Marland Kitchen aspirin 81 MG EC tablet Take 81 mg by mouth daily.        . carvedilol (COREG) 12.5 MG tablet Take 12.5 mg by mouth 2 (two) times daily.        . cyclobenzaprine (FLEXERIL) 5 MG tablet take 1 tablet by mouth three times a day if needed  60 tablet  11  . enalapril (VASOTEC) 10 MG tablet Take 10 mg by mouth 2 (two) times daily.        . furosemide (LASIX) 40 MG tablet Take 40 mg by mouth daily. Take an extra tab if patient needs it for fluid       .  gabapentin (NEURONTIN) 300 MG capsule take 1 capsule three times a day  90 capsule  5  . glimepiride (AMARYL) 4 MG tablet Take 4 mg by mouth daily.        Marland Kitchen glucose blood (FREESTYLE TEST STRIPS) test strip 1 each 3 (three) times daily. Use as instructed       . KLOR-CON M20 20 MEQ tablet TAKE 1 TABLET ONCE DAILY  30 tablet  9  . metFORMIN (GLUMETZA) 500 MG (MOD) 24 hr tablet Take 500 mg by mouth 4 (four) times daily.        . potassium chloride (KLOR-CON) 20 MEQ packet Take 20 mEq by mouth daily.        Marland Kitchen triamcinolone (KENALOG) 0.5 % cream Apply topically 2 (two) times daily.        .  carvedilol (COREG) 12.5 MG tablet Take 1 tablet (12.5 mg total) by mouth 2 (two) times daily with a meal.  60 tablet  11   Review of Systems Review of Systems  Constitutional: Negative for diaphoresis and unexpected weight change.  HENT: Negative for drooling and tinnitus.   Eyes: Negative for photophobia and visual disturbance.  Respiratory: Negative for choking and stridor.   Gastrointestinal: Negative for vomiting and blood in stool.    Objective:   Physical Exam BP 112/60  Pulse 64  Temp(Src) 98.9 F (37.2 C) (Oral)  Ht 5\' 11"  (1.803 m)  Wt 235 lb (106.595 kg)  BMI 32.78 kg/m2  SpO2 95% Physical Exam  VS noted, not ill appearing Constitutional: Pt appears well-developed and well-nourished.  HENT: Head: Normocephalic.  Right Ear: External ear normal.  Left Ear: External ear normal.  Eyes: Conjunctivae and EOM are normal. Pupils are equal, round, and reactive to light.  Neck: Normal range of motion. Neck supple.  Cardiovascular: Normal rate and regular rhythm.   Pulmonary/Chest: Effort normal and breath sounds normal.  Abd:  Soft, NT, non-distended, + BS Neurological: Pt is alert. No cranial nerve deficit.  Skin: Skin is warm. No erythema.  Psychiatric: Pt behavior is normal. Thought content normal.         Assessment & Plan:

## 2010-08-10 NOTE — Telephone Encounter (Signed)
Called the patient informed he has not had a colonoscopy. He was agreeable to having a colonoscopy scheduled if needed.

## 2010-08-10 NOTE — Assessment & Plan Note (Signed)
stable overall by hx and exam, most recent data reviewed with pt, and pt to continue medical treatment as before  Lab Results  Component Value Date   LDLCALC 45 03/11/2010

## 2010-08-10 NOTE — Assessment & Plan Note (Signed)
stable overall by hx and exam, most recent data reviewed with pt, and pt to continue medical treatment as before  BP Readings from Last 3 Encounters:  08/10/10 112/60  05/30/10 110/80  04/05/10 110/70

## 2010-08-10 NOTE — Telephone Encounter (Signed)
Done per emr 

## 2010-08-10 NOTE — Assessment & Plan Note (Signed)
stable overall by hx and exam, most recent data reviewed with pt, and pt to continue medical treatment as before  Lab Results  Component Value Date   HGBA1C 7.1* 03/11/2010

## 2010-08-10 NOTE — Assessment & Plan Note (Addendum)
stable overall by hx and exam, most recent data reviewed with pt, and pt to continue medical treatment as before Has f/u appt card aug 2012 - Dr Jens Som

## 2010-08-10 NOTE — Patient Instructions (Signed)
Continue all other medications as before Your pain medication is refills Please return in 6 months, or sooner if needed

## 2010-08-11 NOTE — Telephone Encounter (Signed)
Pt advised and will expect a call from [facility] for appt info

## 2010-08-16 ENCOUNTER — Encounter: Payer: Self-pay | Admitting: Cardiology

## 2010-08-23 ENCOUNTER — Ambulatory Visit (INDEPENDENT_AMBULATORY_CARE_PROVIDER_SITE_OTHER): Payer: Medicare Other | Admitting: Cardiology

## 2010-08-23 ENCOUNTER — Encounter: Payer: Self-pay | Admitting: Cardiology

## 2010-08-23 DIAGNOSIS — I2510 Atherosclerotic heart disease of native coronary artery without angina pectoris: Secondary | ICD-10-CM

## 2010-08-23 DIAGNOSIS — E785 Hyperlipidemia, unspecified: Secondary | ICD-10-CM

## 2010-08-23 DIAGNOSIS — Z79899 Other long term (current) drug therapy: Secondary | ICD-10-CM

## 2010-08-23 DIAGNOSIS — R0609 Other forms of dyspnea: Secondary | ICD-10-CM

## 2010-08-23 DIAGNOSIS — R0602 Shortness of breath: Secondary | ICD-10-CM

## 2010-08-23 DIAGNOSIS — Z950 Presence of cardiac pacemaker: Secondary | ICD-10-CM

## 2010-08-23 DIAGNOSIS — I5032 Chronic diastolic (congestive) heart failure: Secondary | ICD-10-CM

## 2010-08-23 DIAGNOSIS — I10 Essential (primary) hypertension: Secondary | ICD-10-CM

## 2010-08-23 DIAGNOSIS — R0600 Dyspnea, unspecified: Secondary | ICD-10-CM

## 2010-08-23 NOTE — Progress Notes (Signed)
YQM:VHQIONGE male with past medical history of coronary artery disease status post coronary artery bypass graft, previous pacemaker placement for followup. Patient had a generator change in September of 2011. Last echocardiogram in May 2010 showed an ejection fraction of 55% and moderate left atrial enlargement. There was an anteroseptal wall motion abnormality. The aortic root was mildly dilated. Last cardiac catheterization in May of 2010 showed a normal left main, a 50-70% circumflex, a totally occluded LAD, and a focal 70% right coronary artery. The saphenous vein graft to the circumflex was occluded. The LIMA to the LAD was patent. Ejection fraction was 60%. Myoview in May of 2010 showed an ejection fraction of 54%, and a fixed apical/apical septal defect. Carotid Dopplers in October of 2011 were normal. Since he was last seen in Feb 2012, the patient describes increased dyspnea on exertion. He attributes this to the heat. There is chronic orthopnea but no PND or increased pedal edema. He has not had chest pain.   Current Outpatient Prescriptions  Medication Sig Dispense Refill  . allopurinol (ZYLOPRIM) 300 MG tablet Take 300 mg by mouth daily.        Marland Kitchen ALPRAZolam (XANAX) 0.25 MG tablet Take 0.25 mg by mouth 2 (two) times daily as needed.        Marland Kitchen aspirin 81 MG EC tablet Take 81 mg by mouth daily.        . carvedilol (COREG) 12.5 MG tablet Take 1 tablet (12.5 mg total) by mouth 2 (two) times daily with a meal.  60 tablet  11  . cyclobenzaprine (FLEXERIL) 5 MG tablet take 1 tablet by mouth three times a day if needed  60 tablet  11  . enalapril (VASOTEC) 10 MG tablet Take 10 mg by mouth 2 (two) times daily.        . furosemide (LASIX) 40 MG tablet Take 40 mg by mouth daily. Take an extra tab if patient needs it for fluid       . gabapentin (NEURONTIN) 300 MG capsule take 1 capsule three times a day  90 capsule  5  . glimepiride (AMARYL) 4 MG tablet Take 4 mg by mouth daily.        Marland Kitchen glucose blood  (FREESTYLE TEST STRIPS) test strip 1 each 3 (three) times daily. Use as instructed       . HYDROcodone-acetaminophen (NORCO) 10-325 MG per tablet Take 1 tablet by mouth every 6 (six) hours as needed.  120 tablet  2  . metFORMIN (GLUMETZA) 500 MG (MOD) 24 hr tablet Take 500 mg by mouth 4 (four) times daily.        . NON FORMULARY IMPROVE-IT Study Drug Use as directed by Research Nurse       . potassium chloride SA (K-DUR,KLOR-CON) 20 MEQ tablet Take 20 mEq by mouth daily.        Marland Kitchen triamcinolone (KENALOG) 0.5 % cream Apply topically 2 (two) times daily.           Past Medical History  Diagnosis Date  . 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 DIABETES MELLITUS, TYPE II 08/29/2006  . HYPERLIPIDEMIA 08/29/2006  . GOUT 04/22/2007  . Overweight 08/29/2006  . ERECTILE DYSFUNCTION 11/26/2006  . DEPRESSION 08/29/2006  . HYPERTENSION 08/29/2006  . CORONARY ARTERY DISEASE 08/29/2006  . 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 DIASTOLIC HEART FAILURE, CHRONIC 06/09/2008  . BENIGN PROSTATIC HYPERTROPHY 08/29/2006  . LUMBAR RADICULOPATHY, RIGHT 06/10/2007  . INSOMNIA-SLEEP DISORDER-UNSPEC 10/23/2007  .  PACEMAKER, PERMANENT 03/04/2008  . Left lumbar radiculopathy 05/30/2010    Past Surgical History  Procedure Date  . Coronary artery bypass graft 1992  . Permanent  pacemaker   . S/p 9 stents to coronaries per patient   . S/p left arm surgury after work accident 54  . S/p right hand surgury for foreign object     History   Social History  . Marital Status: Married    Spouse Name: N/A    Number of Children: 2  . Years of Education: N/A   Occupational History  . prior work Games developer   . disabled since 2004    Social History Main Topics  . Smoking status: Former Games developer  . Smokeless tobacco: Not on file  . Alcohol Use: No  . Drug Use: No  . Sexually Active: Not on file   Other Topics Concern  . Not on file   Social History Narrative    . No narrative on file    ROS: no fevers or chills, productive cough, hemoptysis, dysphasia, odynophagia, melena, hematochezia, dysuria, hematuria, rash, seizure activity, orthopnea, PND, pedal edema, claudication. Remaining systems are negative.  Physical Exam: Well-developed well-nourished in no acute distress.  Skin is warm and dry.  HEENT is normal.  Neck is supple. No thyromegaly.  Chest is clear to auscultation with normal expansion. Previous sternotomy Cardiovascular exam is regular rate and rhythm.  Abdominal exam nontender or distended. No masses palpated. Extremities show no edema. neuro grossly intact  ECG sinus rhythm with ventricular pacing.

## 2010-08-23 NOTE — Assessment & Plan Note (Signed)
Continue aspirin and statin. 

## 2010-08-23 NOTE — Patient Instructions (Signed)
Your physician wants you to follow-up in: 6 MONTHS You will receive a reminder letter in the mail two months in advance. If you don't receive a letter, please call our office to schedule the follow-up appointment.   Your physician has requested that you have a lexiscan myoview. For further information please visit [email or web address] Please follow instruction sheet, as given.   Your physician recommends that you return for lab work WITH STRESS TEST

## 2010-08-23 NOTE — Assessment & Plan Note (Signed)
Blood pressure controlled. Continue present medications. Check potassium, renal function and BNP.

## 2010-08-23 NOTE — Assessment & Plan Note (Signed)
Continue present dose of diuretic. Euvolemic on examination.

## 2010-08-23 NOTE — Assessment & Plan Note (Signed)
Patient complains of increased dyspnea that he attributes to the heat. Schedule Myoview to exclude ischemia and reassess LV function.

## 2010-08-23 NOTE — Assessment & Plan Note (Signed)
Followed in the Prove - it study.

## 2010-08-23 NOTE — Assessment & Plan Note (Signed)
Management per electrophysiology. 

## 2010-08-29 ENCOUNTER — Ambulatory Visit (HOSPITAL_COMMUNITY): Payer: Medicare Other | Attending: Cardiology | Admitting: Radiology

## 2010-08-29 ENCOUNTER — Other Ambulatory Visit (INDEPENDENT_AMBULATORY_CARE_PROVIDER_SITE_OTHER): Payer: Medicare Other | Admitting: *Deleted

## 2010-08-29 DIAGNOSIS — Z79899 Other long term (current) drug therapy: Secondary | ICD-10-CM

## 2010-08-29 DIAGNOSIS — R0602 Shortness of breath: Secondary | ICD-10-CM | POA: Insufficient documentation

## 2010-08-29 DIAGNOSIS — R0609 Other forms of dyspnea: Secondary | ICD-10-CM | POA: Insufficient documentation

## 2010-08-29 DIAGNOSIS — I25810 Atherosclerosis of coronary artery bypass graft(s) without angina pectoris: Secondary | ICD-10-CM

## 2010-08-29 DIAGNOSIS — I447 Left bundle-branch block, unspecified: Secondary | ICD-10-CM

## 2010-08-29 DIAGNOSIS — R0600 Dyspnea, unspecified: Secondary | ICD-10-CM

## 2010-08-29 DIAGNOSIS — R0989 Other specified symptoms and signs involving the circulatory and respiratory systems: Secondary | ICD-10-CM

## 2010-08-29 DIAGNOSIS — I2510 Atherosclerotic heart disease of native coronary artery without angina pectoris: Secondary | ICD-10-CM

## 2010-08-29 LAB — BASIC METABOLIC PANEL
BUN: 13 mg/dL (ref 6–23)
CO2: 29 mEq/L (ref 19–32)
Chloride: 106 mEq/L (ref 96–112)
Creatinine, Ser: 0.7 mg/dL (ref 0.4–1.5)
Glucose, Bld: 128 mg/dL — ABNORMAL HIGH (ref 70–99)
Potassium: 5.8 mEq/L — ABNORMAL HIGH (ref 3.5–5.1)

## 2010-08-29 MED ORDER — TECHNETIUM TC 99M TETROFOSMIN IV KIT
33.0000 | PACK | Freq: Once | INTRAVENOUS | Status: AC | PRN
  Administered 2010-08-29: 33 via INTRAVENOUS

## 2010-08-29 MED ORDER — ADENOSINE (DIAGNOSTIC) 3 MG/ML IV SOLN
0.5600 mg/kg | Freq: Once | INTRAVENOUS | Status: AC
  Administered 2010-08-29: 58.8 mg via INTRAVENOUS

## 2010-08-29 MED ORDER — TECHNETIUM TC 99M TETROFOSMIN IV KIT
10.9000 | PACK | Freq: Once | INTRAVENOUS | Status: AC | PRN
  Administered 2010-08-29: 10.9 via INTRAVENOUS

## 2010-08-29 NOTE — Progress Notes (Signed)
[facility] SITE 3 NUCLEAR MED [address] [place] [address] [phone]  Cardiology Nuclear Med Study  [name] is a 65 y.o. male [id number] 03/10/1945   Nuclear Med Background Indication for Stress Test:  Evaluation for Ischemia and Graft Patency/PTCA/Stent Patency History:  '92 CABG; '03 and '04 MI's; Multiple PTCA/Stents; '08 Stent-LAD; 5/10 Echo:EF=55%; 06/18/08 Cath:1Graft Patent w/residual CAD, EF=60%; 06/24/08 UXL:KGMWN Apical/Apical Septal Defect, EF=54%; 10/11 PTVP Generator Change  Cardiac Risk Factors: Family History - CAD, History of Smoking, Hypertension, Lipids and NIDDM  Symptoms:  DOE/SOB   Nuclear Pre-Procedure Caffeine/Decaff Intake:  None NPO After: 7:30pm   Lungs:  Clear.  O2 Sat 96% on RA. IV 0.9% NS with Angio Cath:  22g  IV Site: R Antecubital  IV Started by:  Irean Hong, RN  Chest Size (in):  50 Cup Size: n/a  Height: 5\' 11"  (1.803 m)  Weight:  232 lb (105.235 kg)  BMI:  Body mass index is 32.36 kg/(m^2). Tech Comments:  Held coreg x 24 hrs     Nuclear Med Study 1 or 2 day study: 1 day  Stress Test Type:  Adenosine  Reading MD: Willa Rough, MD  Order Authorizing Provider:  Olga Millers, MD  Resting Radionuclide: Technetium 93m Tetrofosmin  Resting Radionuclide Dose: 10.9 mCi   Stress Radionuclide:  Technetium 42m Tetrofosmin  Stress Radionuclide Dose: 33.0 mCi           Stress Protocol Rest HR: 63 Stress HR: 75  Rest BP: 100/76 Stress BP: 117/68  Exercise Time (min): n/a METS: n/a   Predicted Max HR: 156 bpm % Max HR: 48.08 bpm Rate Pressure Product: 8775   Dose of Adenosine (mg):  n/a Dose of Lexiscan: 0.4 mg  Dose of Atropine (mg): n/a Dose of Dobutamine: n/a mcg/kg/min (at max HR)  Stress Test Technologist: Smiley Houseman, CMA-N  Nuclear Technologist:  Doyne Keel, CNMT     Rest Procedure:  Myocardial perfusion imaging was performed at rest 45 minutes following the intravenous administration of  Technetium 10m Tetrofosmin.  Rest ECG: V-Paced LBBB.  Stress Procedure:  The patient received IV adenosine at 140 mcg/kg/min for 4 minutes.  There were no significant changes with infusion.  He did c/o chest tightness with infusion.  Technetium 56m Tetrofosmin was injected at the 2 minute mark and quantitative spect images were obtained after a 45 minute delay.  Stress ECG: No significant change from baseline ECG  QPS Raw Data Images:  Normal; no motion artifact; normal heart/lung ratio. Stress Images:  There is moderate decrease in activity in the mid/apical anterior segments, the mid/apical anteroseptal segments, and the apical cap. Rest Images:  mild decrease in activity in the apical anterior segment and the apical cap.   Subtraction (SDS):  There is mild ischemia Transient Ischemic Dilatation (Normal <1.22):  1.10 Lung/Heart Ratio (Normal <0.45):  0.32  Quantitative Gated Spect Images QGS EDV:  128 ml QGS ESV:  62 ml QGS cine images:  Hypokinesis of the anterior apical segment, apical anteroseptal segment, apical cap, and apical inferior segment. QGS EF: 51%  Impression Exercise Capacity:  Adenosine study with no exercise. BP Response:  Normal blood pressure response. Clinical Symptoms:  chest tight ECG Impression:  No significant ST segment change suggestive of ischemia. Comparison with Prior Nuclear Study:  See below Overall Impression:  There is apical scar. There is also some ischemia as outlined above. When compared to the image copy from 2010, the ischemia may be more  now.  Willa Rough

## 2010-08-30 ENCOUNTER — Ambulatory Visit: Payer: Medicare Other | Admitting: Internal Medicine

## 2010-08-30 ENCOUNTER — Telehealth: Payer: Self-pay | Admitting: *Deleted

## 2010-08-30 DIAGNOSIS — E875 Hyperkalemia: Secondary | ICD-10-CM

## 2010-08-30 NOTE — Progress Notes (Signed)
Per dr Jens Som review, cont medical tx.  pt aware of results

## 2010-08-30 NOTE — Progress Notes (Signed)
Nuclear report routed to Dr. Crenshaw. [name]  

## 2010-08-30 NOTE — Telephone Encounter (Signed)
Message copied by Freddi Starr on Tue Aug 30, 2010  5:02 PM ------      Message from: Lewayne Bunting      Created: Mon Aug 29, 2010  1:47 PM       Recheck potassium      Olga Millers

## 2010-08-31 ENCOUNTER — Other Ambulatory Visit (INDEPENDENT_AMBULATORY_CARE_PROVIDER_SITE_OTHER): Payer: Medicare Other | Admitting: *Deleted

## 2010-08-31 DIAGNOSIS — E875 Hyperkalemia: Secondary | ICD-10-CM

## 2010-08-31 LAB — BASIC METABOLIC PANEL
BUN: 16 mg/dL (ref 6–23)
CO2: 29 mEq/L (ref 19–32)
Calcium: 9.7 mg/dL (ref 8.4–10.5)
Creatinine, Ser: 0.9 mg/dL (ref 0.4–1.5)
GFR: 87.88 mL/min (ref >60.00)
Glucose, Bld: 162 mg/dL — ABNORMAL HIGH (ref 70–99)

## 2010-09-13 ENCOUNTER — Ambulatory Visit: Payer: Medicare Other | Admitting: Cardiology

## 2010-09-19 ENCOUNTER — Other Ambulatory Visit: Payer: Self-pay | Admitting: Internal Medicine

## 2010-10-11 ENCOUNTER — Encounter: Payer: Self-pay | Admitting: Internal Medicine

## 2010-10-11 ENCOUNTER — Ambulatory Visit (INDEPENDENT_AMBULATORY_CARE_PROVIDER_SITE_OTHER): Payer: Medicare Other | Admitting: Internal Medicine

## 2010-10-11 DIAGNOSIS — I509 Heart failure, unspecified: Secondary | ICD-10-CM

## 2010-10-11 DIAGNOSIS — I442 Atrioventricular block, complete: Secondary | ICD-10-CM

## 2010-10-11 DIAGNOSIS — Z950 Presence of cardiac pacemaker: Secondary | ICD-10-CM

## 2010-10-11 DIAGNOSIS — I25810 Atherosclerosis of coronary artery bypass graft(s) without angina pectoris: Secondary | ICD-10-CM

## 2010-10-11 DIAGNOSIS — I5032 Chronic diastolic (congestive) heart failure: Secondary | ICD-10-CM

## 2010-10-11 LAB — PACEMAKER DEVICE OBSERVATION
AL AMPLITUDE: 5.6 mv
AL IMPEDENCE PM: 512 Ohm
AL THRESHOLD: 0.5 V
BATTERY VOLTAGE: 2.78 V
RV LEAD IMPEDENCE PM: 538 Ohm
VENTRICULAR PACING PM: 100

## 2010-10-11 NOTE — Assessment & Plan Note (Signed)
His symptoms are currently class II. He'll continue his current medical therapy and maintain a low-sodium diet. 

## 2010-10-11 NOTE — Patient Instructions (Signed)
Your physician wants you to follow-up in: 6 months with device clinic and 12 months with Dr Taylor You will receive a reminder letter in the mail two months in advance. If you don't receive a letter, please call our office to schedule the follow-up appointment.  

## 2010-10-11 NOTE — Progress Notes (Signed)
HPI [name] returns today for followup. 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 The patient has underlying complete heart block. He has done well the last year. He denies chest pain, shortness of breath, or peripheral edema. The patient is diabetic but notes that his blood sugars have been well-controlled. He admits to some dietary indiscretion. He remains active. He has had no syncope. Allergies  Allergen Reactions  . Rosuvastatin      Current Outpatient Prescriptions  Medication Sig Dispense Refill  . allopurinol (ZYLOPRIM) 300 MG tablet Take 300 mg by mouth daily.        Marland Kitchen ALPRAZolam (XANAX) 0.25 MG tablet Take 0.25 mg by mouth 2 (two) times daily as needed.        Marland Kitchen aspirin 81 MG EC tablet Take 81 mg by mouth daily.        . carvedilol (COREG) 12.5 MG tablet Take 1 tablet (12.5 mg total) by mouth 2 (two) times daily with a meal.  60 tablet  11  . cyclobenzaprine (FLEXERIL) 5 MG tablet take 1 tablet by mouth three times a day if needed  60 tablet  11  . enalapril (VASOTEC) 10 MG tablet Take 10 mg by mouth 2 (two) times daily.        . furosemide (LASIX) 40 MG tablet Take 40 mg by mouth daily. Take an extra tab if patient needs it for fluid       . gabapentin (NEURONTIN) 300 MG capsule take 1 capsule three times a day  90 capsule  5  . glimepiride (AMARYL) 4 MG tablet TAKE 1 TABLET ONCE DAILY  90 tablet  3  . glucose blood (FREESTYLE TEST STRIPS) test strip 1 each 3 (three) times daily. Use as instructed       . HYDROcodone-acetaminophen (NORCO) 10-325 MG per tablet Take 1 tablet by mouth every 6 (six) hours as needed.  120 tablet  2  . metFORMIN (GLUCOPHAGE) 500 MG tablet TAKE 4 TABLETS ONCE DAILY  120 tablet  10  . metFORMIN (GLUMETZA) 500 MG (MOD) 24 hr tablet Take 500 mg by mouth 4 (four) times daily.        . NON FORMULARY  IMPROVE-IT Study Drug Use as directed by Research Nurse       . potassium chloride SA (K-DUR,KLOR-CON) 20 MEQ tablet Take 20 mEq by mouth daily.        Marland Kitchen triamcinolone (KENALOG) 0.5 % cream Apply topically 2 (two) times daily.           Past Medical History  Diagnosis Date  . 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 DIABETES MELLITUS, TYPE II 08/29/2006  . HYPERLIPIDEMIA 08/29/2006  . GOUT 04/22/2007  . Overweight 08/29/2006  . ERECTILE DYSFUNCTION 11/26/2006  . DEPRESSION 08/29/2006  . HYPERTENSION 08/29/2006  . CORONARY ARTERY DISEASE 08/29/2006  . 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 DIASTOLIC HEART FAILURE, CHRONIC 06/09/2008  . BENIGN PROSTATIC HYPERTROPHY 08/29/2006  . LUMBAR RADICULOPATHY, RIGHT 06/10/2007  . INSOMNIA-SLEEP DISORDER-UNSPEC 10/23/2007  . PACEMAKER, PERMANENT 03/04/2008  . Left lumbar radiculopathy 05/30/2010    ROS:   All systems reviewed and negative except as noted in the HPI.   Past Surgical History  Procedure Date  . Coronary artery bypass graft 1992  . Permanent  pacemaker   . S/p 9 stents to coronaries per patient   .  S/p left arm surgury after work accident 49  . S/p right hand surgury for foreign object      Family History  Problem Relation Age of Onset  . Diabetes Mother   . Diabetes Sister   . Heart disease Sister     2 sister died with heart disease  . Coronary artery disease Other 83    male, first degree relative  . Diabetes Other     1st degree relative  . Heart disease Sister      History   Social History  . Marital Status: Married    Spouse Name: N/A    Number of Children: 2  . Years of Education: N/A   Occupational History  . prior work Games developer   . disabled since 2004    Social History Main Topics  . Smoking status: Former Games developer  . Smokeless tobacco: Not on file  . Alcohol Use: No  . Drug Use: No  . Sexually Active: Not on file   Other Topics Concern  .  Not on file   Social History Narrative  . No narrative on file     BP 102/68  Pulse 64  Ht 5\' 11"  (1.803 m)  Wt 235 lb 12.8 oz (106.958 kg)  BMI 32.89 kg/m2  Physical Exam:  Well appearing NAD HEENT: Unremarkable Neck:  No JVD, no thyromegally Lymphatics:  No adenopathy Back:  No CVA tenderness Lungs:  Clear with no wheezes, rales, or rhonchi. Well-healed pacemaker incision. HEART:  Regular rate rhythm, no murmurs, no rubs, no clicks Abd:  soft, positive bowel sounds, no organomegally, no rebound, no guarding Ext:  2 plus pulses, no edema, no cyanosis, no clubbing Skin:  No rashes no nodules Neuro:  CN II through XII intact, motor grossly intact  DEVICE  Normal device function.  See PaceArt for details.   Assess/Plan:

## 2010-10-11 NOTE — Assessment & Plan Note (Signed)
His device is working normally. He is one year out from pacemaker generator change. Pacing leads are satisfactory. We'll plan to recheck in several months.

## 2010-10-11 NOTE — Assessment & Plan Note (Signed)
He remains active. He denies anginal symptoms. He will continue his current medical therapy. 

## 2010-10-24 ENCOUNTER — Encounter: Payer: Self-pay | Admitting: Internal Medicine

## 2010-10-24 ENCOUNTER — Ambulatory Visit (INDEPENDENT_AMBULATORY_CARE_PROVIDER_SITE_OTHER): Payer: Medicare Other | Admitting: Internal Medicine

## 2010-10-24 ENCOUNTER — Other Ambulatory Visit (INDEPENDENT_AMBULATORY_CARE_PROVIDER_SITE_OTHER): Payer: Medicare Other

## 2010-10-24 VITALS — BP 112/80 | HR 60 | Temp 97.8°F | Ht 71.0 in | Wt 234.2 lb

## 2010-10-24 DIAGNOSIS — E119 Type 2 diabetes mellitus without complications: Secondary | ICD-10-CM

## 2010-10-24 DIAGNOSIS — F329 Major depressive disorder, single episode, unspecified: Secondary | ICD-10-CM

## 2010-10-24 DIAGNOSIS — E785 Hyperlipidemia, unspecified: Secondary | ICD-10-CM

## 2010-10-24 DIAGNOSIS — Z23 Encounter for immunization: Secondary | ICD-10-CM

## 2010-10-24 DIAGNOSIS — I10 Essential (primary) hypertension: Secondary | ICD-10-CM

## 2010-10-24 LAB — BASIC METABOLIC PANEL
Calcium: 8.8 mg/dL (ref 8.4–10.5)
Creatinine, Ser: 0.8 mg/dL (ref 0.4–1.5)
GFR: 104.73 mL/min (ref >60.00)
Sodium: 140 mEq/L (ref 135–145)

## 2010-10-24 LAB — LIPID PANEL
HDL: 36.1 mg/dL — ABNORMAL LOW (ref >39.00)
LDL Cholesterol: 45 mg/dL (ref 0–99)
Total CHOL/HDL Ratio: 3
Triglycerides: 68 mg/dL (ref 0.0–149.0)
VLDL: 13.6 mg/dL (ref 0.0–40.0)

## 2010-10-24 MED ORDER — HYDROCODONE-ACETAMINOPHEN 10-325 MG PO TABS: 1.0000 | ORAL_TABLET | Freq: Four times a day (QID) | ORAL | Status: DC | PRN

## 2010-10-24 NOTE — Patient Instructions (Addendum)
You had the flu shot today Continue all other medications as before Please go to LAB in the Basement for the blood and/or urine tests to be done today Please call the phone number [phone] (the PhoneTree System) for results of testing in 2-3 days;  When calling, simply dial the number, and when prompted enter the MRN number above (the Medical Record Number) and the # key, then the message should start.

## 2010-10-24 NOTE — Progress Notes (Signed)
Subjective:    Patient ID: [name], male    DOB: [birth year], 65 y.o.   MRN: [id number]  HPI  Here to f/u; overall doing ok,  Pt denies chest pain, increased sob or doe, wheezing, orthopnea, PND, increased LE swelling, palpitations, dizziness or syncope.  Pt denies new neurological symptoms such as new headache, or facial or extremity weakness or numbness   Pt denies polydipsia, polyuria, or low sugar symptoms such as weakness or confusion improved with po intake.  Pt states overall good compliance with meds, trying to follow lower cholesterol, diabetic diet, wt overall stable but little exercise however.  Pt continues to have recurring LBP without change in severity, bowel or bladder change, fever, wt loss,  worsening LE pain/numbness/weakness, gait change or falls.  Needs med refills today, including the pain med.   Has had mild worsening depressive symptoms, but no suicidal ideation, or panic, though has ongoing anxiety, not increased recently.  Past Medical History  Diagnosis Date  . 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 DIABETES MELLITUS, TYPE II 08/29/2006  . HYPERLIPIDEMIA 08/29/2006  . GOUT 04/22/2007  . Overweight 08/29/2006  . ERECTILE DYSFUNCTION 11/26/2006  . DEPRESSION 08/29/2006  . HYPERTENSION 08/29/2006  . CORONARY ARTERY DISEASE 08/29/2006  . 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 DIASTOLIC HEART FAILURE, CHRONIC 06/09/2008  . BENIGN PROSTATIC HYPERTROPHY 08/29/2006  . LUMBAR RADICULOPATHY, RIGHT 06/10/2007  . INSOMNIA-SLEEP DISORDER-UNSPEC 10/23/2007  . PACEMAKER, PERMANENT 03/04/2008  . Left lumbar radiculopathy 05/30/2010   Past Surgical History  Procedure Date  . Coronary artery bypass graft 1992  . Permanent  pacemaker   . S/p 9 stents to coronaries per patient   . S/p left arm surgury after work accident 12  . S/p right hand surgury for foreign object     reports that he has quit smoking. He does not have any  smokeless tobacco history on file. He reports that he does not drink alcohol or use illicit drugs. family history includes Coronary artery disease (age of onset:50) in his other; Diabetes in his mother, other, and sister; and Heart disease in his sisters. Allergies  Allergen Reactions  . Rosuvastatin    Current Outpatient Prescriptions on File Prior to Visit  Medication Sig Dispense Refill  . allopurinol (ZYLOPRIM) 300 MG tablet Take 300 mg by mouth daily.        Marland Kitchen ALPRAZolam (XANAX) 0.25 MG tablet Take 0.25 mg by mouth 2 (two) times daily as needed.        Marland Kitchen aspirin 81 MG EC tablet Take 81 mg by mouth daily.        . carvedilol (COREG) 12.5 MG tablet Take 1 tablet (12.5 mg total) by mouth 2 (two) times daily with a meal.  60 tablet  11  . cyclobenzaprine (FLEXERIL) 5 MG tablet take 1 tablet by mouth three times a day if needed  60 tablet  11  . enalapril (VASOTEC) 10 MG tablet Take 10 mg by mouth 2 (two) times daily.        . furosemide (LASIX) 40 MG tablet Take 40 mg by mouth daily. Take an extra tab if patient needs it for fluid       . gabapentin (NEURONTIN) 300 MG capsule take 1 capsule three times a day  90 capsule  5  . glimepiride (AMARYL) 4 MG tablet TAKE 1 TABLET ONCE DAILY  90 tablet  3  . glucose blood (FREESTYLE TEST STRIPS) test strip  1 each 3 (three) times daily. Use as instructed       . NON FORMULARY IMPROVE-IT Study Drug Use as directed by Research Nurse       . metFORMIN (GLUCOPHAGE) 500 MG tablet TAKE 4 TABLETS ONCE DAILY  120 tablet  10  . potassium chloride SA (K-DUR,KLOR-CON) 20 MEQ tablet Take 20 mEq by mouth daily.        Marland Kitchen triamcinolone (KENALOG) 0.5 % cream Apply topically 2 (two) times daily.         Review of Systems Review of Systems  Constitutional: Negative for diaphoresis and unexpected weight change.  HENT: Negative for drooling and tinnitus.   Eyes: Negative for photophobia and visual disturbance.  Respiratory: Negative for choking and stridor.     Gastrointestinal: Negative for vomiting and blood in stool.  Genitourinary: Negative for hematuria and decreased urine volume.     Objective:   Physical Exam BP 112/80  Pulse 60  Temp(Src) 97.8 F (36.6 C) (Oral)  Ht 5\' 11"  (1.803 m)  Wt 234 lb 4 oz (106.255 kg)  BMI 32.67 kg/m2  SpO2 96% Physical Exam  VS noted Constitutional: Pt appears well-developed and well-nourished.  HENT: Head: Normocephalic.  Right Ear: External ear normal.  Left Ear: External ear normal.  Eyes: Conjunctivae and EOM are normal. Pupils are equal, round, and reactive to light.  Neck: Normal range of motion. Neck supple.  Cardiovascular: Normal rate and regular rhythm.   Pulmonary/Chest: Effort normal and breath sounds normal.  Neurological: Pt is alert. No cranial nerve deficit.  Skin: Skin is warm. No erythema.  Psychiatric: Pt behavior is normal. Thought content normal. mild depressed affect, not nervous       Assessment & Plan:

## 2010-10-30 ENCOUNTER — Encounter: Payer: Self-pay | Admitting: Internal Medicine

## 2010-10-30 NOTE — Assessment & Plan Note (Signed)
stable overall by hx and exam, most recent data reviewed with pt, and pt to continue medical treatment as before  BP Readings from Last 3 Encounters:  10/24/10 112/80  10/11/10 102/68  08/23/10 115/73

## 2010-10-30 NOTE — Assessment & Plan Note (Signed)
stable overall by hx and exam, most recent data reviewed with pt, and pt to continue medical treatment as before  Lab Results  Component Value Date   HGBA1C 7.2* 10/24/2010    

## 2010-10-30 NOTE — Assessment & Plan Note (Signed)
stable overall by hx and exam, most recent data reviewed with pt, and pt to continue medical treatment as before  Lab Results  Component Value Date   LDLCALC 45 10/24/2010

## 2010-10-30 NOTE — Assessment & Plan Note (Signed)
Mild worsening, declines ssri trial,  to f/u any worsening symptoms or concerns

## 2010-10-31 LAB — CARDIAC PANEL(CRET KIN+CKTOT+MB+TROPI)
CK, MB: 10.8 — ABNORMAL HIGH
CK, MB: 9.5 — ABNORMAL HIGH
Relative Index: 3.9 — ABNORMAL HIGH
Total CK: 241 — ABNORMAL HIGH

## 2010-10-31 LAB — GLUCOSE, CAPILLARY
Glucose-Capillary: 120 — ABNORMAL HIGH
Glucose-Capillary: 122 — ABNORMAL HIGH
Glucose-Capillary: 135 — ABNORMAL HIGH
Glucose-Capillary: 79

## 2010-10-31 LAB — DIFFERENTIAL
Basophils Absolute: 0
Eosinophils Relative: 3
Lymphocytes Relative: 21
Lymphs Abs: 1.5
Monocytes Absolute: 0.5
Monocytes Relative: 8

## 2010-10-31 LAB — CBC
Hemoglobin: 14
MCHC: 33.4
Platelets: 130 — ABNORMAL LOW
RBC: 4.28
RBC: 4.71
WBC: 4.8

## 2010-10-31 LAB — CK TOTAL AND CKMB (NOT AT ARMC)
CK, MB: 12.3 — ABNORMAL HIGH
Relative Index: 3.9 — ABNORMAL HIGH
Total CK: 319 — ABNORMAL HIGH

## 2010-10-31 LAB — BASIC METABOLIC PANEL
BUN: 16
Calcium: 8.9
Chloride: 106
Creatinine, Ser: 0.86
GFR calc Af Amer: >60
GFR calc non Af Amer: >60

## 2010-10-31 LAB — POCT CARDIAC MARKERS: Troponin i, poc: <0.05

## 2010-10-31 LAB — HEMOGLOBIN A1C
Hgb A1c MFr Bld: 6.1
Mean Plasma Glucose: 128

## 2010-10-31 LAB — TSH: TSH: 1.076

## 2010-10-31 LAB — COMPREHENSIVE METABOLIC PANEL
Albumin: 3.7
BUN: 24 — ABNORMAL HIGH
Calcium: 9
Creatinine, Ser: 1.08
Glucose, Bld: 96
Total Protein: 6.1

## 2010-10-31 LAB — LIPID PANEL
LDL Cholesterol: 47
Triglycerides: 94
VLDL: 19

## 2010-10-31 LAB — PROTIME-INR: Prothrombin Time: 13

## 2010-10-31 LAB — APTT: aPTT: 30

## 2010-10-31 LAB — TROPONIN I: Troponin I: 0.01

## 2010-12-27 ENCOUNTER — Other Ambulatory Visit: Payer: Self-pay

## 2010-12-27 ENCOUNTER — Encounter (HOSPITAL_COMMUNITY): Payer: Self-pay | Admitting: Emergency Medicine

## 2010-12-27 ENCOUNTER — Inpatient Hospital Stay (HOSPITAL_COMMUNITY)
Admission: EM | Admit: 2010-12-27 | Discharge: 2010-12-28 | DRG: 312 | Disposition: A | Discharge status: Home / Self Care | Payer: Medicare Other | Attending: Cardiology | Admitting: Cardiology

## 2010-12-27 ENCOUNTER — Emergency Department (HOSPITAL_COMMUNITY): Payer: Medicare Other

## 2010-12-27 DIAGNOSIS — Z951 Presence of aortocoronary bypass graft: Secondary | ICD-10-CM

## 2010-12-27 DIAGNOSIS — E119 Type 2 diabetes mellitus without complications: Secondary | ICD-10-CM | POA: Diagnosis present

## 2010-12-27 DIAGNOSIS — Z9861 Coronary angioplasty status: Secondary | ICD-10-CM

## 2010-12-27 DIAGNOSIS — M109 Gout, unspecified: Secondary | ICD-10-CM | POA: Diagnosis present

## 2010-12-27 DIAGNOSIS — R079 Chest pain, unspecified: Secondary | ICD-10-CM

## 2010-12-27 DIAGNOSIS — Z79899 Other long term (current) drug therapy: Secondary | ICD-10-CM

## 2010-12-27 DIAGNOSIS — N400 Enlarged prostate without lower urinary tract symptoms: Secondary | ICD-10-CM | POA: Diagnosis present

## 2010-12-27 DIAGNOSIS — F3289 Other specified depressive episodes: Secondary | ICD-10-CM | POA: Diagnosis present

## 2010-12-27 DIAGNOSIS — Z7982 Long term (current) use of aspirin: Secondary | ICD-10-CM

## 2010-12-27 DIAGNOSIS — R55 Syncope and collapse: Secondary | ICD-10-CM

## 2010-12-27 DIAGNOSIS — Z950 Presence of cardiac pacemaker: Secondary | ICD-10-CM

## 2010-12-27 DIAGNOSIS — I219 Acute myocardial infarction, unspecified: Secondary | ICD-10-CM

## 2010-12-27 DIAGNOSIS — I2510 Atherosclerotic heart disease of native coronary artery without angina pectoris: Secondary | ICD-10-CM | POA: Diagnosis present

## 2010-12-27 DIAGNOSIS — Z8546 Personal history of malignant neoplasm of prostate: Secondary | ICD-10-CM

## 2010-12-27 DIAGNOSIS — E663 Overweight: Secondary | ICD-10-CM | POA: Diagnosis present

## 2010-12-27 DIAGNOSIS — F329 Major depressive disorder, single episode, unspecified: Secondary | ICD-10-CM | POA: Diagnosis present

## 2010-12-27 DIAGNOSIS — I5032 Chronic diastolic (congestive) heart failure: Secondary | ICD-10-CM | POA: Diagnosis present

## 2010-12-27 DIAGNOSIS — E785 Hyperlipidemia, unspecified: Secondary | ICD-10-CM | POA: Diagnosis present

## 2010-12-27 DIAGNOSIS — I509 Heart failure, unspecified: Secondary | ICD-10-CM | POA: Diagnosis present

## 2010-12-27 DIAGNOSIS — I10 Essential (primary) hypertension: Secondary | ICD-10-CM | POA: Diagnosis present

## 2010-12-27 HISTORY — DX: Sleep apnea, unspecified: G47.30

## 2010-12-27 HISTORY — DX: Heart failure, unspecified: I50.9

## 2010-12-27 HISTORY — DX: Acute myocardial infarction, unspecified: I21.9

## 2010-12-27 HISTORY — PX: CORONARY ANGIOPLASTY WITH STENT PLACEMENT: SHX49

## 2010-12-27 HISTORY — DX: Unspecified osteoarthritis, unspecified site: M19.90

## 2010-12-27 LAB — BASIC METABOLIC PANEL
CO2: 26 mEq/L (ref 19–32)
Chloride: 100 mEq/L (ref 96–112)
Creatinine, Ser: 0.85 mg/dL (ref 0.50–1.35)
GFR calc Af Amer: >90 mL/min (ref >90)
Potassium: 4 mEq/L (ref 3.5–5.1)
Sodium: 140 mEq/L (ref 135–145)

## 2010-12-27 LAB — DIFFERENTIAL
Eosinophils Relative: 5 % (ref 0–5)
Lymphocytes Relative: 29 % (ref 12–46)
Lymphs Abs: 2 10*3/uL (ref 0.7–4.0)
Monocytes Absolute: 0.4 10*3/uL (ref 0.1–1.0)
Monocytes Relative: 7 % (ref 3–12)
Neutro Abs: 4.1 10*3/uL (ref 1.7–7.7)

## 2010-12-27 LAB — URINALYSIS, ROUTINE W REFLEX MICROSCOPIC
Bilirubin Urine: NEGATIVE
Glucose, UA: NEGATIVE mg/dL
Ketones, ur: NEGATIVE mg/dL
Nitrite: NEGATIVE
Specific Gravity, Urine: 1.008 (ref 1.005–1.030)
pH: 5 (ref 5.0–8.0)

## 2010-12-27 LAB — POCT I-STAT TROPONIN I

## 2010-12-27 LAB — CBC
HCT: 41.9 % (ref 39.0–52.0)
HCT: 41.4 % (ref 39.0–52.0)
Hemoglobin: 13.9 g/dL (ref 13.0–17.0)
MCH: 30 pg (ref 26.0–34.0)
MCHC: 33.6 g/dL (ref 30.0–36.0)
MCV: 89.3 fL (ref 78.0–100.0)
RBC: 4.69 MIL/uL (ref 4.22–5.81)
RDW: 14 % (ref 11.5–15.5)
WBC: 6.8 10*3/uL (ref 4.0–10.5)

## 2010-12-27 LAB — CARDIAC PANEL(CRET KIN+CKTOT+MB+TROPI)
CK, MB: 13.5 ng/mL (ref 0.3–4.0)
Total CK: 449 U/L — ABNORMAL HIGH (ref 7–232)

## 2010-12-27 LAB — CREATININE, SERUM: GFR calc non Af Amer: >90 mL/min (ref >90)

## 2010-12-27 MED ORDER — GABAPENTIN 300 MG PO CAPS
300.0000 mg | ORAL_CAPSULE | Freq: Three times a day (TID) | ORAL | Status: DC
  Administered 2010-12-27 – 2010-12-28 (×2): 300 mg via ORAL
  Filled 2010-12-27 (×4): qty 1

## 2010-12-27 MED ORDER — NITROGLYCERIN 0.4 MG SL SUBL: 0.4000 mg | SUBLINGUAL_TABLET | SUBLINGUAL | Status: DC | PRN

## 2010-12-27 MED ORDER — ENOXAPARIN SODIUM 40 MG/0.4ML ~~LOC~~ SOLN
40.0000 mg | SUBCUTANEOUS | Status: DC
  Administered 2010-12-27: 40 mg via SUBCUTANEOUS
  Filled 2010-12-27: qty 0.4

## 2010-12-27 MED ORDER — CARVEDILOL 12.5 MG PO TABS
12.5000 mg | ORAL_TABLET | Freq: Two times a day (BID) | ORAL | Status: DC
  Administered 2010-12-27 – 2010-12-28 (×2): 12.5 mg via ORAL
  Filled 2010-12-27 (×3): qty 1

## 2010-12-27 MED ORDER — SODIUM CHLORIDE 0.9 % IJ SOLN
3.0000 mL | Freq: Two times a day (BID) | INTRAMUSCULAR | Status: DC
  Administered 2010-12-27 – 2010-12-28 (×2): 3 mL via INTRAVENOUS

## 2010-12-27 MED ORDER — HYDROCODONE-ACETAMINOPHEN 10-325 MG PO TABS: 1.0000 | ORAL_TABLET | Freq: Every evening | ORAL | Status: DC | PRN

## 2010-12-27 MED ORDER — ALLOPURINOL 300 MG PO TABS
300.0000 mg | ORAL_TABLET | Freq: Every day | ORAL | Status: DC
  Administered 2010-12-27 – 2010-12-28 (×2): 300 mg via ORAL
  Filled 2010-12-27 (×2): qty 1

## 2010-12-27 MED ORDER — LOPERAMIDE HCL 2 MG PO CAPS
2.0000 mg | ORAL_CAPSULE | ORAL | Status: DC | PRN
  Administered 2010-12-28: 2 mg via ORAL
  Filled 2010-12-27: qty 1

## 2010-12-27 MED ORDER — SODIUM CHLORIDE 0.9 % IJ SOLN: 3.0000 mL | INTRAMUSCULAR | Status: DC | PRN

## 2010-12-27 MED ORDER — GLIMEPIRIDE 4 MG PO TABS
4.0000 mg | ORAL_TABLET | Freq: Every day | ORAL | Status: DC
  Administered 2010-12-28: 4 mg via ORAL
  Filled 2010-12-27 (×2): qty 1

## 2010-12-27 MED ORDER — SIMVASTATIN 10 MG PO TABS
10.0000 mg | ORAL_TABLET | Freq: Every day | ORAL | Status: DC
  Filled 2010-12-27: qty 1

## 2010-12-27 MED ORDER — POTASSIUM CHLORIDE CRYS ER 20 MEQ PO TBCR
20.0000 meq | EXTENDED_RELEASE_TABLET | Freq: Every day | ORAL | Status: DC
  Administered 2010-12-27 – 2010-12-28 (×2): 20 meq via ORAL
  Filled 2010-12-27 (×2): qty 1

## 2010-12-27 MED ORDER — METFORMIN HCL 500 MG PO TABS
2000.0000 mg | ORAL_TABLET | Freq: Every day | ORAL | Status: DC
  Administered 2010-12-28: 2000 mg via ORAL
  Filled 2010-12-27 (×2): qty 4

## 2010-12-27 MED ORDER — ACETAMINOPHEN 325 MG PO TABS: 650.0000 mg | ORAL_TABLET | ORAL | Status: DC | PRN

## 2010-12-27 MED ORDER — ASPIRIN EC 81 MG PO TBEC
81.0000 mg | DELAYED_RELEASE_TABLET | Freq: Every day | ORAL | Status: DC
  Administered 2010-12-28: 81 mg via ORAL
  Filled 2010-12-27: qty 1

## 2010-12-27 MED ORDER — CYCLOBENZAPRINE HCL 10 MG PO TABS: 5.0000 mg | ORAL_TABLET | Freq: Three times a day (TID) | ORAL | Status: DC | PRN

## 2010-12-27 MED ORDER — MORPHINE SULFATE 4 MG/ML IJ SOLN: 4.0000 mg | Freq: Once | INTRAMUSCULAR | Status: DC

## 2010-12-27 MED ORDER — ONDANSETRON HCL 4 MG/2ML IJ SOLN: 4.0000 mg | Freq: Once | INTRAMUSCULAR | Status: DC

## 2010-12-27 MED ORDER — ASPIRIN EC 325 MG PO TBEC: 325.0000 mg | DELAYED_RELEASE_TABLET | Freq: Every day | ORAL | Status: DC

## 2010-12-27 MED ORDER — ONDANSETRON HCL 4 MG/2ML IJ SOLN: 4.0000 mg | Freq: Four times a day (QID) | INTRAMUSCULAR | Status: DC | PRN

## 2010-12-27 MED ORDER — SODIUM CHLORIDE 0.9 % IV SOLN: 250.0000 mL | INTRAVENOUS | Status: DC | PRN

## 2010-12-27 NOTE — ED Notes (Signed)
Attempted report to Cordelia Pen, Charity fundraiser. Told nurse unavailable for report.

## 2010-12-27 NOTE — ED Provider Notes (Signed)
History     CSN: [id number] Arrival date & time: 12/27/2010  1:26 PM   First MD Initiated Contact with Patient 12/27/10 1327      Chief Complaint  Patient presents with  . Chest Pain  . Loss of Consciousness    (Consider location/radiation/quality/duration/timing/severity/associated sxs/prior treatment) Patient is a 65 y.o. male presenting with chest pain.  Chest Pain The chest pain began 2 days ago. Episode Length: unknown. Chest pain occurs intermittently. The chest pain is improving. At its most intense, the pain is at 10/10. The pain is currently at 0/10. The severity of the pain is moderate. The quality of the pain is described as aching, pressure-like and sharp. The pain does not radiate. Primary symptoms include syncope. Pertinent negatives for primary symptoms include no fever, no fatigue, no shortness of breath, no cough, no wheezing, no palpitations, no abdominal pain, no nausea, no vomiting, no dizziness and no altered mental status.  There was no dizziness, weakness or nausea. The syncopal episode did not occur with palpitations or shortness of breath.  Pertinent negatives for associated symptoms include no claudication, no diaphoresis, no lower extremity edema, no near-syncope, no numbness, no orthopnea, no paroxysmal nocturnal dyspnea and no weakness. He tried nothing for the symptoms. Risk factors include male gender and obesity.     Past Medical History  Diagnosis Date  . 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 DIABETES MELLITUS, TYPE II 08/29/2006  . HYPERLIPIDEMIA 08/29/2006  . GOUT 04/22/2007  . Overweight 08/29/2006  . ERECTILE DYSFUNCTION 11/26/2006  . DEPRESSION 08/29/2006  . HYPERTENSION 08/29/2006  . CORONARY ARTERY DISEASE 08/29/2006  . 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 DIASTOLIC HEART FAILURE, CHRONIC 06/09/2008  . BENIGN PROSTATIC HYPERTROPHY 08/29/2006  . LUMBAR RADICULOPATHY, RIGHT 06/10/2007  .  INSOMNIA-SLEEP DISORDER-UNSPEC 10/23/2007  . PACEMAKER, PERMANENT 03/04/2008  . Left lumbar radiculopathy 05/30/2010    Past Surgical History  Procedure Date  . Coronary artery bypass graft 1992  . Permanent  pacemaker   . S/p 9 stents to coronaries per patient   . S/p left arm surgury after work accident 51  . S/p right hand surgury for foreign object     Family History  Problem Relation Age of Onset  . Diabetes Mother   . Diabetes Sister   . Heart disease Sister     2 sister died with heart disease  . Coronary artery disease Other 85    male, first degree relative  . Diabetes Other     1st degree relative  . Heart disease Sister     History  Substance Use Topics  . Smoking status: Former Games developer  . Smokeless tobacco: Not on file  . Alcohol Use: No      Review of Systems  Constitutional: Negative for fever, diaphoresis and fatigue.  Respiratory: Negative for cough, shortness of breath and wheezing.   Cardiovascular: Positive for chest pain and syncope. Negative for palpitations, orthopnea, claudication and near-syncope.  Gastrointestinal: Negative for nausea, vomiting and abdominal pain.  Neurological: Negative for dizziness, weakness and numbness.  Psychiatric/Behavioral: Negative for altered mental status.  All other systems reviewed and are negative.    Allergies  Crestor and Rosuvastatin  Home Medications   Current Outpatient Rx  Name Route Sig Dispense Refill  . ALLOPURINOL 300 MG PO TABS Oral Take 300 mg by mouth daily.      Marland Kitchen ALPRAZOLAM 0.25 MG PO TABS Oral Take 0.25 mg by mouth 2 (two) times  daily as needed.      Marland Kitchen CARVEDILOL 12.5 MG PO TABS Oral Take 1 tablet (12.5 mg total) by mouth 2 (two) times daily with a meal. 60 tablet 11  . CYCLOBENZAPRINE HCL 5 MG PO TABS  take 1 tablet by mouth three times a day if needed 60 tablet 11  . ENALAPRIL MALEATE 10 MG PO TABS Oral Take 10 mg by mouth 2 (two) times daily.      . FUROSEMIDE 40 MG PO TABS Oral Take 40  mg by mouth daily. Take an extra tab if patient needs it for fluid     . GABAPENTIN 300 MG PO CAPS  take 1 capsule three times a day 90 capsule 5  . GLIMEPIRIDE 4 MG PO TABS  TAKE 1 TABLET ONCE DAILY 90 tablet 3  . GLUCOSE BLOOD VI STRP  1 each 3 (three) times daily. Use as instructed     . HYDROCODONE-ACETAMINOPHEN 10-325 MG PO TABS Oral Take 1 tablet by mouth every 6 (six) hours as needed. 120 tablet 2  . METFORMIN HCL 500 MG PO TABS  TAKE 4 TABLETS ONCE DAILY 120 tablet 10  . NON FORMULARY  IMPROVE-IT Study Drug Use as directed by Research Nurse     . POTASSIUM CHLORIDE CRYS CR 20 MEQ PO TBCR Oral Take 20 mEq by mouth daily.      . TRIAMCINOLONE ACETONIDE 0.5 % EX CREA Topical Apply topically 2 (two) times daily.        BP 133/75  Temp(Src) 98.2 F (36.8 C) (Oral)  Resp 18  SpO2 95%  Physical Exam  Nursing note and vitals reviewed. Constitutional: He is oriented to person, place, and time. He appears well-developed and well-nourished.  HENT:  Head: Normocephalic and atraumatic.    Eyes: EOM are normal. Pupils are equal, round, and reactive to light.  Neck: Normal range of motion.  Cardiovascular: Normal rate and regular rhythm.   Pulmonary/Chest: Effort normal and breath sounds normal.  Abdominal: Soft. Bowel sounds are normal.  Musculoskeletal: Normal range of motion.       Arms: Neurological: He is alert and oriented to person, place, and time.  Skin: Skin is warm and dry.    ED Course  Procedures (including critical care time)   Labs Reviewed  BASIC METABOLIC PANEL  CBC  DIFFERENTIAL  URINALYSIS, ROUTINE W REFLEX MICROSCOPIC  I-STAT TROPONIN I   No results found.   No diagnosis found.    MDM   Date: 12/27/2010  Rate: 79  Rhythm: unknown rhythm, irregular rate 62-97  QRS Axis: normal  Intervals: normal  ST/T Wave abnormalities: normal  Conduction Disutrbances:first-degree A-V block   Narrative Interpretation:   Old EKG Reviewed: unchanged   Pt  has a pacemaker, is a pt of [facility]. Pt had an episode of syncope with chest pain two years ago and states that he had a Cath done which looked "normal" per pt report. I am awaiting pts labs then I plan to consult [place].      Dorthula Matas, PA 12/27/10 1459

## 2010-12-27 NOTE — Progress Notes (Signed)
ANTICOAGULATION CONSULT NOTE - Initial Consult  Pharmacy Consult for heparin Indication: chest pain/ACS  Allergies  Allergen Reactions  . Crestor (Rosuvastatin Calcium) Other (See Comments)    Common to girls period.   Patient Measurements: Height: 5\' 11"  (180.3 cm) Weight: 230 lb (104.327 kg) IBW/kg (Calculated) : 75.3  Adjusted Body Weight: 97.2 kg  Vital Signs: Temp: 98.1 F (36.7 C) (11/27 1854) Temp src: Oral (11/27 1854) BP: 151/86 mmHg (11/27 1854) Pulse Rate: 52  (11/27 1854)  Labs:  Basename 12/27/10 2042 12/27/10 2041 12/27/10 1410  HGB -- 13.9 13.9  HCT -- 41.4 41.9  PLT -- 157 175  APTT -- -- --  LABPROT -- -- --  INR -- -- --  HEPARINUNFRC -- -- --  CREATININE -- 0.84 0.85  CKTOTAL 449* -- --  CKMB 13.5* -- --  TROPONINI <0.30 -- --   Estimated Creatinine Clearance: 109.2 ml/min (by C-G formula based on Cr of 0.84).  Medical History: Past Medical History  Diagnosis Date  . 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 DIABETES MELLITUS, TYPE II 08/29/2006  . HYPERLIPIDEMIA 08/29/2006  . GOUT 04/22/2007  . Overweight 08/29/2006  . ERECTILE DYSFUNCTION 11/26/2006  . DEPRESSION 08/29/2006  . HYPERTENSION 08/29/2006  . CORONARY ARTERY DISEASE 08/29/2006  . 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 DIASTOLIC HEART FAILURE, CHRONIC 06/09/2008  . BENIGN PROSTATIC HYPERTROPHY 08/29/2006  . LUMBAR RADICULOPATHY, RIGHT 06/10/2007  . INSOMNIA-SLEEP DISORDER-UNSPEC 10/23/2007  . PACEMAKER, PERMANENT 03/04/2008    pt denies this date  . Left lumbar radiculopathy 05/30/2010  . CHF (congestive heart failure)   . Angina   . Myocardial infarction 12/27/10    "I've had several MIs"  . Sleep apnea     "if I lay flat I quit breathing; HOB up I'm fine"  . Arthritis     "lower back; going back down both my sciatic nerves"  . Syncope and collapse 12/27/10   Medications:  Prescriptions prior to admission  Medication Sig  Dispense Refill  . allopurinol (ZYLOPRIM) 300 MG tablet Take 300 mg by mouth daily.       Marland Kitchen aspirin EC 325 MG tablet Take 325 mg by mouth daily.        . carvedilol (COREG) 12.5 MG tablet Take 12.5 mg by mouth 2 (two) times daily.        . cyclobenzaprine (FLEXERIL) 5 MG tablet Take 5 mg by mouth 3 (three) times daily as needed. For muscle spasms       . gabapentin (NEURONTIN) 300 MG capsule Take 300 mg by mouth 3 (three) times daily.        Marland Kitchen glimepiride (AMARYL) 4 MG tablet Take 4 mg by mouth daily.        Marland Kitchen HYDROcodone-acetaminophen (NORCO) 10-325 MG per tablet Take 1 tablet by mouth at bedtime as needed. For pain       . metFORMIN (GLUCOPHAGE) 500 MG tablet Take 2,000 mg by mouth daily.        . NON FORMULARY Take 3 tablets by mouth daily. Random Ization 10908: Study Drug       . potassium chloride SA (K-DUR,KLOR-CON) 20 MEQ tablet Take 20 mEq by mouth daily.       . pravastatin (PRAVACHOL) 20 MG tablet Take 20 mg by mouth at bedtime.        . triamcinolone (KENALOG) 0.5 % cream Apply 1 application topically 2 (two) times daily as needed. For break out of  affected area.       Scheduled:    . allopurinol  300 mg Oral Daily  . aspirin EC  81 mg Oral Daily  . carvedilol  12.5 mg Oral BID  . gabapentin  300 mg Oral TID  . glimepiride  4 mg Oral QAC breakfast  . metFORMIN  2,000 mg Oral Q breakfast  . potassium chloride SA  20 mEq Oral Daily  . simvastatin  10 mg Oral q1800  . sodium chloride  3 mL Intravenous Q12H  . DISCONTD: aspirin EC  325 mg Oral Daily  . DISCONTD: enoxaparin  40 mg Subcutaneous Q24H  . DISCONTD:  morphine injection  4 mg Intravenous Once  . DISCONTD: ondansetron  4 mg Intravenous Once   Assessment: 65yo male c/o syncopal episode followed by CP that resolved on its own, now with abnormal CE (borderline troponin), to begin heparin.  Goal of Therapy:  Heparin level 0.3-0.7 units/ml   Plan:  Rec'd Lovenox 40mg  tonight, so will give small heparin bolus of 2000  units followed by gtt at 1300 units/hr.  Monitor heparin levels and CBC.  Colleen Can PharmD BCPS 12/27/2010,11:55 PM

## 2010-12-27 NOTE — H&P (Signed)
Hospital Admission Note Date: 12/27/2010  Patient name: TANVEER BRAMMER Medical record number: [id number] Date of birth: 1945-07-17 Age: 65 y.o. Gender: male PCP: Oliver Barre, MD, MD  Chief Complaint: Syncope, CP  History of Present Illness: Patient is a 65 yo male with a past medical history of extensive coronary heart disease status post CABG in 1992 and multiple PCI's, most recent of which was in 2008. Was transported to [facility] emergency room by EMS after he had a syncopal episode at home, patient reports she was walking to his bedroom when he suddenly collapsed, after which it followed a period of confusion, he does not recall if he lost consciousness, and reports while on the floor he experienced chest pain which was retrosternal, pressure-like in nature, 8/10 in intensity, which disappeared after several minutes. Patient currently appears to have difficulty focusing, also his son notes that he may have had slurred speech and possible facial droop transiently which is now resolved, his son also reports that the patient was twitching repeatedly for several minutes in his right arm and neck. Patient reports that he had not had similar symptoms to this in the past, though he does report a single episode of syncope 3 years ago which was attributed to a mild ACS after extensive workup. Patient currently feels well denies any chest pain, shortness of breath, fever or chills and otherwise reports feeling well. He also denies any recent changes in his diet or medication and denies being sick recently.   Meds: .  allopurinol (ZYLOPRIM) 300 MG tablet  Take 300 mg by mouth daily.           Marland Kitchen  ALPRAZolam (XANAX) 0.25 MG tablet  Take 0.25 mg by mouth 2 (two) times daily as needed.           Marland Kitchen  aspirin 81 MG EC tablet  Take 81 mg by mouth daily.           .  carvedilol (COREG) 12.5 MG tablet  Take 1 tablet (12.5 mg total) by mouth 2 (two) times daily with a meal.    .  cyclobenzaprine (FLEXERIL) 5 MG  tablet  take 1 tablet by mouth three times a day if needed    .  enalapril (VASOTEC) 10 MG tablet  Take 10 mg by mouth 2 (two) times daily.           .  furosemide (LASIX) 40 MG tablet  Take 40 mg by mouth daily. Take an extra tab if patient needs it for fluid          .  gabapentin (NEURONTIN) 300 MG capsule  take 1 capsule three times a day    .  glimepiride (AMARYL) 4 MG tablet  TAKE 1 TABLET ONCE DAILY     .  metFORMIN (GLUCOPHAGE) 500 MG tablet  TAKE 4 TABLETS ONCE DAILY    .  potassium chloride SA (K-DUR,KLOR-CON) 20 MEQ tablet  Take 20 mEq by mouth daily.             Allergies: Crestor Past Medical History  Diagnosis Date  . 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 DIABETES MELLITUS, TYPE II 08/29/2006  . HYPERLIPIDEMIA 08/29/2006  . GOUT 04/22/2007  . Overweight 08/29/2006  . ERECTILE DYSFUNCTION 11/26/2006  . DEPRESSION 08/29/2006  . HYPERTENSION 08/29/2006  . CORONARY ARTERY DISEASE 08/29/2006  . 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 DIASTOLIC HEART FAILURE, CHRONIC 06/09/2008  . BENIGN PROSTATIC  HYPERTROPHY 08/29/2006  . LUMBAR RADICULOPATHY, RIGHT 06/10/2007  . INSOMNIA-SLEEP DISORDER-UNSPEC 10/23/2007  . PACEMAKER, PERMANENT 03/04/2008  . Left lumbar radiculopathy 05/30/2010   Past Surgical History  Procedure Date  . Coronary artery bypass graft 1992  . Permanent  pacemaker   . S/p 9 stents to coronaries per patient   . S/p left arm surgury after work accident 37  . S/p right hand surgury for foreign object    Family History  Problem Relation Age of Onset  . Diabetes Mother   . Diabetes Sister   . Heart disease Sister     2 sister died with heart disease  . Coronary artery disease Other 2    male, first degree relative  . Diabetes Other     1st degree relative  . Heart disease Sister    History   Social History  . Marital Status: Married    Spouse Name: N/A    Number of Children: 2  . Years of Education: N/A    Occupational History  . prior work Games developer   . disabled since 2004    Social History Main Topics  . Smoking status: Former Games developer  . Smokeless tobacco: Not on file  . Alcohol Use: No  . Drug Use: No  . Sexually Active: Not on file   Other Topics Concern  . Not on file   Social History Narrative  . No narrative on file    Review of Systems: Negative except per HPI  Physical Exam: Blood pressure 129/78, temperature 98.2 F (36.8 C), temperature source Oral, resp. rate 18, SpO2 95.00%. General:  alert, well-developed, and cooperative to examination.   Head:  normocephalic and atraumatic.   Mouth:  pharynx pink and moist, no erythema, and no exudates.   Neck:  supple, full ROM, no thyromegaly, no JVD, and no carotid bruits.   Lungs:  normal respiratory effort, no accessory muscle use, normal breath sounds, no crackles, and no wheezes. Heart:  normal rate, irregular rhythm, no significant murmur, no gallop, and no rub.   Abdomen:  soft, non-tender, normal bowel sounds, no distention, no guarding, no rebound tenderness, no hepatomegaly, and no splenomegaly.   Msk:  no joint swelling, no joint warmth, and no redness over joints.   Pulses:  2+ DP/PT pulses bilaterally Extremities:  No cyanosis, clubbing, edema  Lab results: Basic Metabolic Panel:  Basename 12/27/10 1410  NA 140  K 4.0  CL 100  CO2 26  GLUCOSE 158*  BUN 20  CREATININE 0.85  CALCIUM 9.3  MG --  PHOS --   CBC:  Basename 12/27/10 1410  WBC 6.8  NEUTROABS 4.1  HGB 13.9  HCT 41.9  MCV 89.3  PLT 175    Imaging results:  Pending  Other results: EKG: PPM, LBBB, no change from prior exam  Assessment & Plan by Problem: Syncope:  Unclear etiology, patient denied any chest pain, palpitation or orthostatic symptoms during the event. Currently does not have any focal deficits though there is a question of slurred speech which is currently not present. Also currently his cardiac enzymes are  negative and chest x-ray is clear.  Differentials includes bradycardia and hypotension, dehydration, vasovagal, seizures/stroke/TIA, or acute coronary syndrome. Will admit overnight and rule out ACS given his extensive history of coronary artery disease. Plan: - Admit to Tele - Consider Neurology if focal deficits develope - Orthostatics now and in AM - Check Pacemaker in AM - CEx3 - 12 lead EKG now and in  AM   Signed: Darnelle Maffucci 12/27/2010, 4:36 PM     History reviewed with the patient, no changes to be made. He reports sudden syncope without prodrome.  He has had no chest pain, dyspnea or palpitations.  He denies any orthostatic symptoms.  He has otherwise been doing well.  He had no focal motor or visual disturbance.  There was a question of some confusion and mild slurred speech but this is vague. The patient exam reveals nonfocal neuro exam.  Clear lungs.  No murmur.  No edema.  All available labs, radiology testing, previous records reviewed. Agree with documented assessment and plan.  At this point the etiology of the syncope is unclear.  Observe overnight and check his enzymes and orthostatic blood pressures.  Check pacemaker in the am.  I doubt that any further in patient cardiovascular testing will be needed. Fayrene Fearing Hochrein  5:30 PM 12/16/2010

## 2010-12-27 NOTE — ED Notes (Addendum)
Pt c/o 1/10 sharp pain to left side of chest on arrival to room.

## 2010-12-27 NOTE — Progress Notes (Signed)
Critical lab value: CKMB 13.5 1st page MD: 2200 2nd page MD:   Receiving MD: Dr. Mayford Knife at 2205  [facility]

## 2010-12-27 NOTE — ED Notes (Signed)
Family called EMS after hearing pt fall in livingroom. +LOC. When woke up pt c/o 9/10 chest pain. Aspirin 325mg  PTA, cbg 132, hx pacemaker, stent x9, mi x7, cabg x2, dm, htn, 20g r hand

## 2010-12-27 NOTE — ED Provider Notes (Signed)
Medical screening examination/treatment/procedure(s) were performed by non-physician practitioner and as supervising physician I was immediately available for consultation/collaboration.   Nat Christen, MD 12/27/10 [phone]

## 2010-12-27 NOTE — ED Notes (Signed)
Pt returned from xray

## 2010-12-28 ENCOUNTER — Other Ambulatory Visit: Payer: Self-pay

## 2010-12-28 ENCOUNTER — Inpatient Hospital Stay (HOSPITAL_COMMUNITY): Payer: Medicare Other

## 2010-12-28 LAB — BASIC METABOLIC PANEL
BUN: 18 mg/dL (ref 6–23)
CO2: 24 mEq/L (ref 19–32)
Chloride: 100 mEq/L (ref 96–112)
Creatinine, Ser: 0.74 mg/dL (ref 0.50–1.35)
Potassium: 4.3 mEq/L (ref 3.5–5.1)

## 2010-12-28 LAB — CBC
HCT: 42.6 % (ref 39.0–52.0)
MCV: 89.5 fL (ref 78.0–100.0)
Platelets: 166 10*3/uL (ref 150–400)
RBC: 4.76 MIL/uL (ref 4.22–5.81)
WBC: 7.5 10*3/uL (ref 4.0–10.5)

## 2010-12-28 LAB — GLUCOSE, CAPILLARY: Glucose-Capillary: 126 mg/dL — ABNORMAL HIGH (ref 70–99)

## 2010-12-28 LAB — CARDIAC PANEL(CRET KIN+CKTOT+MB+TROPI)
CK, MB: 12.7 ng/mL (ref 0.3–4.0)
CK, MB: 12.3 ng/mL (ref 0.3–4.0)
Relative Index: 3.1 — ABNORMAL HIGH (ref 0.0–2.5)
Total CK: 380 U/L — ABNORMAL HIGH (ref 7–232)

## 2010-12-28 LAB — TSH: TSH: 0.753 u[IU]/mL (ref 0.350–4.500)

## 2010-12-28 LAB — HEPARIN LEVEL (UNFRACTIONATED): Heparin Unfractionated: 0.31 IU/mL (ref 0.30–0.70)

## 2010-12-28 MED ORDER — HEPARIN SOD (PORCINE) IN D5W 100 UNIT/ML IV SOLN
1400.0000 [IU]/h | INTRAVENOUS | Status: DC
  Administered 2010-12-28: 1300 [IU]/h via INTRAVENOUS
  Filled 2010-12-28 (×2): qty 250

## 2010-12-28 MED ORDER — HEPARIN BOLUS VIA INFUSION
2000.0000 [IU] | Freq: Once | INTRAVENOUS | Status: AC
  Administered 2010-12-28: 2000 [IU] via INTRAVENOUS
  Filled 2010-12-28: qty 2000

## 2010-12-28 MED ORDER — ASPIRIN EC 81 MG PO TBEC: 81.0000 mg | DELAYED_RELEASE_TABLET | Freq: Every day | ORAL | Status: DC

## 2010-12-28 NOTE — Progress Notes (Signed)
ANTICOAGULATION CONSULT NOTE - Initial Consult  Pharmacy Consult for Heparin Indication: chest pain/ACS  Allergies  Allergen Reactions  . Crestor (Rosuvastatin Calcium) Other (See Comments)    Common to girls period.   Patient Measurements: Height: 5\' 11"  (180.3 cm) Weight: 230 lb (104.327 kg) IBW/kg (Calculated) : 75.3  Heparin Dosing Weight: 97.2 kg  Vital Signs: Temp: 97.7 F (36.5 C) (11/28 0647) Temp src: Oral (11/28 0647) BP: 135/76 mmHg (11/28 0936) Pulse Rate: 80  (11/28 0936)  Labs:  Basename 12/28/10 0705 12/28/10 0250 12/27/10 2042 12/27/10 2041 12/27/10 1410  HGB 14.4 -- -- 13.9 --  HCT 42.6 -- -- 41.4 41.9  PLT 166 -- -- 157 175  APTT -- -- -- -- --  LABPROT -- -- -- -- --  INR -- -- -- -- --  HEPARINUNFRC 0.31 -- -- -- --  CREATININE 0.74 -- -- 0.84 0.85  CKTOTAL 380* 406* 449* -- --  CKMB 12.3* 12.7* 13.5* -- --  TROPONINI <0.30 <0.30 <0.30 -- --   Estimated Creatinine Clearance: 114.7 ml/min (by C-G formula based on Cr of 0.74).  Medical History: Past Medical History  Diagnosis Date  . 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 DIABETES MELLITUS, TYPE II 08/29/2006  . HYPERLIPIDEMIA 08/29/2006  . GOUT 04/22/2007  . Overweight 08/29/2006  . ERECTILE DYSFUNCTION 11/26/2006  . DEPRESSION 08/29/2006  . HYPERTENSION 08/29/2006  . CORONARY ARTERY DISEASE 08/29/2006  . 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 DIASTOLIC HEART FAILURE, CHRONIC 06/09/2008  . BENIGN PROSTATIC HYPERTROPHY 08/29/2006  . LUMBAR RADICULOPATHY, RIGHT 06/10/2007  . INSOMNIA-SLEEP DISORDER-UNSPEC 10/23/2007  . PACEMAKER, PERMANENT 03/04/2008    pt denies this date  . Left lumbar radiculopathy 05/30/2010  . CHF (congestive heart failure)   . Angina   . Myocardial infarction 12/27/10    "I've had several MIs"  . Sleep apnea     "if I lay flat I quit breathing; HOB up I'm fine"  . Arthritis     "lower back; going back down both my sciatic  nerves"  . Syncope and collapse 12/27/10   Medications:  Prescriptions prior to admission  Medication Sig Dispense Refill  . allopurinol (ZYLOPRIM) 300 MG tablet Take 300 mg by mouth daily.       Marland Kitchen aspirin EC 325 MG tablet Take 325 mg by mouth daily.        . carvedilol (COREG) 12.5 MG tablet Take 12.5 mg by mouth 2 (two) times daily.        . cyclobenzaprine (FLEXERIL) 5 MG tablet Take 5 mg by mouth 3 (three) times daily as needed. For muscle spasms       . gabapentin (NEURONTIN) 300 MG capsule Take 300 mg by mouth 3 (three) times daily.        Marland Kitchen glimepiride (AMARYL) 4 MG tablet Take 4 mg by mouth daily.        Marland Kitchen HYDROcodone-acetaminophen (NORCO) 10-325 MG per tablet Take 1 tablet by mouth at bedtime as needed. For pain       . metFORMIN (GLUCOPHAGE) 500 MG tablet Take 2,000 mg by mouth daily.        . NON FORMULARY Take 3 tablets by mouth daily. Random Ization 10908: Study Drug       . potassium chloride SA (K-DUR,KLOR-CON) 20 MEQ tablet Take 20 mEq by mouth daily.       . pravastatin (PRAVACHOL) 20 MG tablet Take 20 mg by mouth at bedtime.        Marland Kitchen  triamcinolone (KENALOG) 0.5 % cream Apply 1 application topically 2 (two) times daily as needed. For break out of affected area.       Scheduled:     . allopurinol  300 mg Oral Daily  . aspirin EC  81 mg Oral Daily  . carvedilol  12.5 mg Oral BID  . gabapentin  300 mg Oral TID  . glimepiride  4 mg Oral QAC breakfast  . heparin  2,000 Units Intravenous Once  . metFORMIN  2,000 mg Oral Q breakfast  . potassium chloride SA  20 mEq Oral Daily  . simvastatin  10 mg Oral q1800  . sodium chloride  3 mL Intravenous Q12H  . DISCONTD: aspirin EC  325 mg Oral Daily  . DISCONTD: enoxaparin  40 mg Subcutaneous Q24H  . DISCONTD:  morphine injection  4 mg Intravenous Once  . DISCONTD: ondansetron  4 mg Intravenous Once   Assessment: 65 y.o. M on heparin for CP with a therapeutic heparin level this a.m. No signs/symptoms of bleeding noted. CE (-).  Noted plans for possible discharge home after pacemaker interrogated. Will increase slightly to keep within range while awaiting further plans.  Goal of Therapy:  Heparin level 0.3-0.7 units/ml   Plan:  1. Increase heparin drip slightly to 1400 units/hr (14 ml/hr) 2. Will continue to monitor for any signs/symptoms of bleeding and will follow up with heparin level in the a.m if patient still here  Georgina Pillion, PharmD  12/28/2010,10:24 AM

## 2010-12-28 NOTE — Progress Notes (Signed)
PPM Specifications   Following MD:  Everardo Beals. Juanda Chance, MD     PPM Vendor:  [employer]     PPM Model Number:  [id number]     PPM Serial Number:  [id number] [facility]   PPM DOI:  11/02/2009     PPM Implanting MD:  Everardo Beals. Juanda Chance, MD

## 2010-12-28 NOTE — Progress Notes (Signed)
Subjective:  The patient feels well.  No dizziness. Cardiac enzymes neg. Tele shows AV paced rhythm.  Objective:  Vital Signs in the last 24 hours: Temp:  [97.7 F (36.5 C)-98.2 F (36.8 C)] 97.7 F (36.5 C) (11/28 0647) Pulse Rate:  [52-81] 79  (11/28 0653) Resp:  [18-21] 20  (11/28 0647) BP: (119-151)/(70-86) 119/70 mmHg (11/28 0653) SpO2:  [91 %-95 %] 94 % (11/28 0647) Weight:  [230 lb (104.327 kg)] 230 lb (104.327 kg) (11/27 1854)  Intake/Output from previous day: 11/27 0701 - 11/28 0700 In: -  Out: 3 [Stool:3] Intake/Output from this shift:       . allopurinol  300 mg Oral Daily  . aspirin EC  81 mg Oral Daily  . carvedilol  12.5 mg Oral BID  . gabapentin  300 mg Oral TID  . glimepiride  4 mg Oral QAC breakfast  . heparin  2,000 Units Intravenous Once  . metFORMIN  2,000 mg Oral Q breakfast  . potassium chloride SA  20 mEq Oral Daily  . simvastatin  10 mg Oral q1800  . sodium chloride  3 mL Intravenous Q12H  . DISCONTD: aspirin EC  325 mg Oral Daily  . DISCONTD: enoxaparin  40 mg Subcutaneous Q24H  . DISCONTD:  morphine injection  4 mg Intravenous Once  . DISCONTD: ondansetron  4 mg Intravenous Once      . heparin 1,300 Units/hr (12/28/10 0103)    Physical Exam: The patient appears to be in no distress.  Head and neck exam reveals that the pupils are equal and reactive.  The extraocular movements are full.  There is no scleral icterus.  Mouth and pharynx are benign.  No lymphadenopathy.  No carotid bruits.  The jugular venous pressure is normal.  Thyroid is not enlarged or tender.  Chest is clear to percussion and auscultation.  No rales or rhonchi.  Expansion of the chest is symmetrical.  Heart reveals no abnormal lift or heave.  First and second heart sounds are normal.  There is no murmur gallop rub or click.  The abdomen is soft and nontender.  Bowel sounds are normoactive.  There is no hepatosplenomegaly or mass.  There are no abdominal  bruits.  Extremities reveal no phlebitis or edema.  Pedal pulses are good.  There is no cyanosis or clubbing.  Neurologic exam is normal strength and no lateralizing weakness.  No sensory deficits.  Integument reveals no rash  Lab Results:  Basename 12/28/10 0705 12/27/10 2041  WBC 7.5 7.6  HGB 14.4 13.9  PLT 166 157    Basename 12/27/10 2041 12/27/10 1410  NA -- 140  K -- 4.0  CL -- 100  CO2 -- 26  GLUCOSE -- 158*  BUN -- 20  CREATININE 0.84 0.85    Basename 12/28/10 0250 12/27/10 2042  TROPONINI <0.30 <0.30   Hepatic Function Panel No results found for this basename: PROT,ALBUMIN,AST,ALT,ALKPHOS,BILITOT,BILIDIR,IBILI in the last 72 hours No results found for this basename: CHOL in the last 72 hours No results found for this basename: PROTIME in the last 72 hours  Imaging: Imaging results have been reviewed.  Cardiac Studies:  Assessment/Plan:  Patient Active Problem List  Diagnoses  . Syncope ? Etiology      Pacemaker to be interrogated this am.      If pacemaker okay, anticipate home today.  .   .   .   .   .   .   .   .   .   .   .   .   .   .   .   .   .   .   .   .   .   .   .   Marland Kitchen  LOS: 1 day    Cassell Clement 12/28/2010, 7:46 AM

## 2010-12-28 NOTE — Discharge Summary (Signed)
Discharge Summary   Patient ID: [name] MRN: [id number], DOB/AGE: 02-02-1945 65 y.o. Admit date: 12/27/2010 D/C date:     12/28/2010   Primary Discharge Diagnoses:  1. Syncope. - ppm functioning normally without arrythmia - ruled out for acute coronary syndrome - negative CT of head  Secondary Discharge Diagnoses:  1. CAD s/p CABG 1992 (history of multiple PCI's), last cath 05/2008 with EF 60% 2. Symptomatic bradycardia with underlying complete heart block sp Medtronic PPM (last gen change 10/2009) 3. HTN 4. Diabetes mellitus, type 2 5. HL 6. Gout 7. Overweight 8. Erectile dysfunction 9. Depression 10. Lumbar radiculopathy 11. Insomnia 12. BPH 13. Left arm surgery after work accident 53.Right hand surgery for foreign object  Hospital Course: 65 y/o M with history of CAD s/p CABG 1992 and multiple PCIs who presented to [facility] with an episode of syncope. He reported he was walking to his bedroom when he suddenly collapsed, after which came a period of confusion. The patient did not recall if he lost consciousness, and reports while on the floor he experienced chest pain which was retrosternal, pressure-like in nature, 8/10 in intensity, which disappeared after several minutes.H is son noted that he may have had slurred speech & twitching repeatedly for several minutes in his right arm and neck. He reported a single episode of syncope 3 years ago which was attributed to a mild ACS after extensive workup. At time of evaluation by cardiology, he denied any chest pain, shortness of breath, fever or chills and otherwise reported feeling well. Because of his extensive cardiac history, he was admitted to the hospital for further evaluation. Cardiac enzymes were cycled, which showed elevated CK peak of 449/MB peak of 13.5, but 3 negative troponins. TSH was negative. CT of the head was performed this morning with no acute intracranial abnormality. Interrogation of his pacemaker revealed no  arrythmias including VT, VF, AF or device malfunction. These results were discussed with Dr. Patty Sermons, who has seen and examined him today and feels he is stable for discharge. He imposes no new restrictions on the patient, just to be careful from getting up too fast from sitting position.  Discharge Vitals: Blood pressure 135/76, pulse 80, temperature 97.7 F (36.5 C), temperature source Oral, resp. rate 20, height 5\' 11"  (1.803 m), weight 230 lb (104.327 kg), SpO2 94.00%.  Labs: Lab Results  Component Value Date   WBC 7.5 12/28/2010   HGB 14.4 12/28/2010   HCT 42.6 12/28/2010   MCV 89.5 12/28/2010   PLT 166 12/28/2010    Lab 12/28/10 0705  NA 138  K 4.3  CL 100  CO2 24  BUN 18  CREATININE 0.74  CALCIUM 9.4  PROT --  BILITOT --  ALKPHOS --  ALT --  AST --  GLUCOSE 151*    Basename 12/28/10 0705 12/28/10 0250 12/27/10 2042  CKTOTAL 380* 406* 449*  CKMB 12.3* 12.7* 13.5*  TROPONINI <0.30 <0.30 <0.30   Lab Results  Component Value Date   CHOL 95 10/24/2010   HDL 36.10* 10/24/2010   LDLCALC 45 10/24/2010   TRIG 68.0 10/24/2010   Lab Results  Component Value Date   DDIMER  Value: 0.32        AT THE INHOUSE ESTABLISHED CUTOFF VALUE OF 0.48 ug/mL FEU, THIS ASSAY HAS BEEN DOCUMENTED IN THE LITERATURE TO HAVE A SENSITIVITY AND NEGATIVE PREDICTIVE VALUE OF AT LEAST 98 TO 99%.  THE TEST RESULT SHOULD BE CORRELATED WITH AN ASSESSMENT OF THE CLINICAL PROBABILITY OF  DVT / VTE. 06/15/2008    Diagnostic Studies/Procedures:  1. Chest 2 View - 12/27/2010  IMPRESSION: Stable exam.  No active disease.   2. Ct Head Wo Contrast - 12/28/2010 CT HEAD WITHOUT CONTRAST  Technique:  Contiguous axial images were obtained from the base of the skull through the vertex without contrast.  Comparison: 10/23/2007  Findings: Chronic ischemic changes and global atrophy.  No mass effect, midline shift, or acute intracranial hemorrhage.  Mastoid air cells and visualized paranasal sinuses are clear.  Cranium  is intact.  IMPRESSION: No acute intracranial pathology.     Discharge Medications   Current Discharge Medication List    CONTINUE these medications which have CHANGED   Details  aspirin EC 81 MG tablet Take 1 tablet (81 mg total) by mouth daily.      CONTINUE these medications which have NOT CHANGED   Details  allopurinol (ZYLOPRIM) 300 MG tablet Take 300 mg by mouth daily.     carvedilol (COREG) 12.5 MG tablet Take 12.5 mg by mouth 2 (two) times daily.      cyclobenzaprine (FLEXERIL) 5 MG tablet Take 5 mg by mouth 3 (three) times daily as needed. For muscle spasms     gabapentin (NEURONTIN) 300 MG capsule Take 300 mg by mouth 3 (three) times daily.      glimepiride (AMARYL) 4 MG tablet Take 4 mg by mouth daily.      metFORMIN (GLUCOPHAGE) 500 MG tablet Take 2,000 mg by mouth daily.      NON FORMULARY Take 3 tablets by mouth daily. Random Ization 10908: Study Drug     potassium chloride SA (K-DUR,KLOR-CON) 20 MEQ tablet Take 20 mEq by mouth daily.     pravastatin (PRAVACHOL) 20 MG tablet Take 20 mg by mouth at bedtime.      triamcinolone (KENALOG) 0.5 % cream Apply 1 application topically 2 (two) times daily as needed. For break out of affected area.      STOP taking these medications     HYDROcodone-acetaminophen (NORCO) 10-325 MG per tablet         Disposition   The patient will be discharged in stable condition to home. Discharge Orders    Future Appointments: Provider: Department: Dept Phone: Center:   01/20/2011 12:15 PM Lewayne Bunting, MD [facility] [place] [phone] [facility]   02/02/2011 8:00 AM Oliver Barre, MD [facility] [phone] [facility]   02/20/2011 8:00 AM Lewayne Bunting, MD [facility] [phone] [facility]     Future Orders Please Complete By Expires   Diet - low sodium heart healthy      Increase activity slowly      Comments:   Increase activity slowly. Take your time getting up from the sitting position and do not get up too  fast.     Follow-up Information    Follow up with Olga Millers, MD. (01/20/11 at 12:15pm)    Contact information:   [address] [place] [address] [phone]            Duration of Discharge Encounter: Greater than 30 minutes including physician and PA time.  Signed, Kriste Basque Dunn PA-C 12/28/2010, 2:02 PM

## 2010-12-28 NOTE — Plan of Care (Signed)
Problem: Discharge Progression Outcomes Goal: No anginal pain Outcome: Completed/Met Date Met:  12/28/10 c Goal: Other Discharge Outcomes/Goals Outcome: Completed/Met Date Met:  12/28/10 PPM interogated

## 2010-12-28 NOTE — Plan of Care (Signed)
Problem: Phase I Progression Outcomes Goal: Aspirin unless contraindicated Outcome: Completed/Met Date Met:  12/28/10 c

## 2011-01-20 ENCOUNTER — Encounter: Payer: Self-pay | Admitting: Cardiology

## 2011-01-20 ENCOUNTER — Ambulatory Visit (INDEPENDENT_AMBULATORY_CARE_PROVIDER_SITE_OTHER): Payer: Medicare Other | Admitting: Cardiology

## 2011-01-20 VITALS — BP 112/59 | HR 64 | Ht 71.0 in | Wt 243.0 lb

## 2011-01-20 DIAGNOSIS — I5032 Chronic diastolic (congestive) heart failure: Secondary | ICD-10-CM

## 2011-01-20 DIAGNOSIS — E785 Hyperlipidemia, unspecified: Secondary | ICD-10-CM

## 2011-01-20 DIAGNOSIS — I509 Heart failure, unspecified: Secondary | ICD-10-CM

## 2011-01-20 DIAGNOSIS — R55 Syncope and collapse: Secondary | ICD-10-CM

## 2011-01-20 DIAGNOSIS — I2510 Atherosclerotic heart disease of native coronary artery without angina pectoris: Secondary | ICD-10-CM

## 2011-01-20 DIAGNOSIS — I10 Essential (primary) hypertension: Secondary | ICD-10-CM

## 2011-01-20 NOTE — Assessment & Plan Note (Signed)
Management per electrophysiology. 

## 2011-01-20 NOTE — Assessment & Plan Note (Signed)
Euvolemic on examination.

## 2011-01-20 NOTE — Progress Notes (Signed)
[name]:[id number] male with past medical history of coronary artery disease status post coronary artery bypass graft, previous pacemaker placement for followup. Patient had a generator change in September of 2011. Last echocardiogram in May 2010 showed an ejection fraction of 55% and moderate left atrial enlargement. There was an anteroseptal wall motion abnormality. The aortic root was mildly dilated. Last cardiac catheterization in May of 2010 showed a normal left main, a 50-70% circumflex, a totally occluded LAD, and a focal 70% right coronary artery. The saphenous vein graft to the circumflex was occluded. The LIMA to the LAD was patent. Ejection fraction was 60%. Myoview in July of 2012 showed an ejection fraction of 51%, apical scar and mild ischemia; we are treating medically. Carotid Dopplers in October of 2011 were normal. Had syncopal episode in Nov 2011. Admitted and troponins normal. Pacemaker interrogation revealed no arrhythmias. Since DC, he has dyspnea on exertion but this is unchanged and chronic. No orthopnea or PND. No chest pain, palpitations or recurrent syncope.   Current Outpatient Prescriptions  Medication Sig Dispense Refill  . allopurinol (ZYLOPRIM) 300 MG tablet Take 300 mg by mouth daily.       Marland Kitchen aspirin EC 81 MG tablet Take 1 tablet (81 mg total) by mouth daily.      . carvedilol (COREG) 12.5 MG tablet Take 12.5 mg by mouth 2 (two) times daily.        . cyclobenzaprine (FLEXERIL) 5 MG tablet Take 5 mg by mouth 3 (three) times daily as needed. For muscle spasms       . gabapentin (NEURONTIN) 300 MG capsule Take 300 mg by mouth 3 (three) times daily.        Marland Kitchen glimepiride (AMARYL) 4 MG tablet Take 4 mg by mouth daily.        . metFORMIN (GLUCOPHAGE) 500 MG tablet Take 2,000 mg by mouth daily.        . NON FORMULARY Take 3 tablets by mouth daily. Random Ization 10908: Study Drug       . NON FORMULARY Study drug 1 tab po qd       . potassium chloride SA (K-DUR,KLOR-CON) 20 MEQ  tablet Take 20 mEq by mouth daily.       Marland Kitchen triamcinolone (KENALOG) 0.5 % cream Apply 1 application topically 2 (two) times daily as needed. For break out of affected area.         Past Medical History  Diagnosis Date  . 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 DIABETES MELLITUS, TYPE II 08/29/2006  . HYPERLIPIDEMIA 08/29/2006  . GOUT 04/22/2007  . Overweight 08/29/2006  . ERECTILE DYSFUNCTION 11/26/2006  . DEPRESSION 08/29/2006  . HYPERTENSION 08/29/2006  . CORONARY ARTERY DISEASE 08/29/2006  . 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 DIASTOLIC HEART FAILURE, CHRONIC 06/09/2008  . BENIGN PROSTATIC HYPERTROPHY 08/29/2006  . LUMBAR RADICULOPATHY, RIGHT 06/10/2007  . INSOMNIA-SLEEP DISORDER-UNSPEC 10/23/2007  . PACEMAKER, PERMANENT 03/04/2008    pt denies this date  . Left lumbar radiculopathy 05/30/2010  . Myocardial infarction 12/27/10    "I've had several MIs"  . Sleep apnea     "if I lay flat I quit breathing; HOB up I'm fine"  . Arthritis     "lower back; going back down both my sciatic nerves"  . Syncope and collapse 12/27/10    Past Surgical History  Procedure Date  . S/p left arm surgury after work accident 1991    "2000# steel fell on  it"  . S/p right hand surgury for foreign object 1970's    "piece of wood went in my hand; had to get that out"  . Insert / replace / remove pacemaker ~ 2004    initial pacemaker placement  . Insert / replace / remove pacemaker 10/2009    generator change  . Coronary artery bypass graft 1992    CABG X 2  . Coronary angioplasty with stent placement 12/27/10    "I've had a total of 9 cardiac stents put in"    History   Social History  . Marital Status: Married    Spouse Name: N/A    Number of Children: 2  . Years of Education: N/A   Occupational History  . prior work Games developer   . disabled since 2004    Social History Main Topics  . Smoking status: Former Smoker -- 3.0 packs/day  for 9 years    Types: Cigarettes    Quit date: 12/09/1970  . Smokeless tobacco: Former Neurosurgeon  . Alcohol Use: No  . Drug Use: No     "used pouches of tobacco for a long time; quit those 12/09/1970"  . Sexually Active: Yes   Other Topics Concern  . Not on file   Social History Narrative  . No narrative on file    ROS: no fevers or chills, productive cough, hemoptysis, dysphasia, odynophagia, melena, hematochezia, dysuria, hematuria, rash, seizure activity, orthopnea, PND, pedal edema, claudication. Remaining systems are negative.  Physical Exam: Well-developed well-nourished in no acute distress.  Skin is warm and dry.  HEENT is normal.  Neck is supple. No thyromegaly.  Chest is diminished breath sounds Cardiovascular exam is regular rate and rhythm.  Abdominal exam nontender or distended. No masses palpated. Extremities show no edema. neuro grossly intact  ecg - 12/27/10 - sinus with ventricular pacing

## 2011-01-20 NOTE — Assessment & Plan Note (Addendum)
Etiology unclear. Apparently his device was interrogated with no significant arrhythmia. Repeat echocardiogram to assess LV function. Question orthostatic component

## 2011-01-20 NOTE — Patient Instructions (Signed)
Your physician wants you to follow-up in: 6 MONTHS WITH DR Jens Som  You will receive a reminder letter in the mail two months in advance. If you don't receive a letter, please call our office to schedule the follow-up appointment. Your physician recommends that you continue on your current medications as directed. Please refer to the Current Medication list given to you today. Your physician has requested that you have an echocardiogram. Echocardiography is a painless test that uses sound waves to create images of your heart. It provides your doctor with information about the size and shape of your heart and how well your heart's chambers and valves are working. This procedure takes approximately one hour. There are no restrictions for this procedure. DX SYNCOPE

## 2011-01-20 NOTE — Assessment & Plan Note (Signed)
Continue study drug

## 2011-01-20 NOTE — Assessment & Plan Note (Signed)
Blood pressure controlled. Continue present medications. 

## 2011-01-20 NOTE — Assessment & Plan Note (Signed)
Continue aspirin and PROVE IT study drug. Last Myoview low risk. Continue medical therapy.

## 2011-01-27 ENCOUNTER — Ambulatory Visit (HOSPITAL_COMMUNITY): Payer: Medicare Other | Attending: Internal Medicine | Admitting: Radiology

## 2011-01-27 DIAGNOSIS — E119 Type 2 diabetes mellitus without complications: Secondary | ICD-10-CM | POA: Insufficient documentation

## 2011-01-27 DIAGNOSIS — I10 Essential (primary) hypertension: Secondary | ICD-10-CM | POA: Insufficient documentation

## 2011-01-27 DIAGNOSIS — E785 Hyperlipidemia, unspecified: Secondary | ICD-10-CM | POA: Insufficient documentation

## 2011-01-27 DIAGNOSIS — R55 Syncope and collapse: Secondary | ICD-10-CM | POA: Insufficient documentation

## 2011-02-02 ENCOUNTER — Encounter: Payer: Self-pay | Admitting: Internal Medicine

## 2011-02-02 ENCOUNTER — Other Ambulatory Visit: Payer: Self-pay | Admitting: *Deleted

## 2011-02-02 ENCOUNTER — Ambulatory Visit (INDEPENDENT_AMBULATORY_CARE_PROVIDER_SITE_OTHER): Payer: Medicare Other | Admitting: Internal Medicine

## 2011-02-02 VITALS — BP 110/68 | HR 65 | Temp 98.1°F | Ht 71.0 in | Wt 241.4 lb

## 2011-02-02 DIAGNOSIS — E785 Hyperlipidemia, unspecified: Secondary | ICD-10-CM

## 2011-02-02 DIAGNOSIS — I10 Essential (primary) hypertension: Secondary | ICD-10-CM

## 2011-02-02 DIAGNOSIS — E119 Type 2 diabetes mellitus without complications: Secondary | ICD-10-CM

## 2011-02-02 DIAGNOSIS — I77810 Thoracic aortic ectasia: Secondary | ICD-10-CM

## 2011-02-02 DIAGNOSIS — R062 Wheezing: Secondary | ICD-10-CM

## 2011-02-02 HISTORY — DX: Thoracic aortic ectasia: I77.810

## 2011-02-02 MED ORDER — ALLOPURINOL 300 MG PO TABS: 300.0000 mg | ORAL_TABLET | Freq: Every day | ORAL | Status: DC

## 2011-02-02 MED ORDER — CYCLOBENZAPRINE HCL 5 MG PO TABS: 5.0000 mg | ORAL_TABLET | Freq: Three times a day (TID) | ORAL | Status: DC | PRN

## 2011-02-02 MED ORDER — CARVEDILOL 12.5 MG PO TABS: 12.5000 mg | ORAL_TABLET | Freq: Two times a day (BID) | ORAL | Status: DC

## 2011-02-02 MED ORDER — LISINOPRIL 2.5 MG PO TABS: 2.5000 mg | ORAL_TABLET | Freq: Every day | ORAL | Status: DC

## 2011-02-02 MED ORDER — GABAPENTIN 300 MG PO CAPS: 300.0000 mg | ORAL_CAPSULE | Freq: Three times a day (TID) | ORAL | Status: DC

## 2011-02-02 MED ORDER — POTASSIUM CHLORIDE CRYS ER 20 MEQ PO TBCR: 20.0000 meq | EXTENDED_RELEASE_TABLET | Freq: Every day | ORAL | Status: DC

## 2011-02-02 MED ORDER — METFORMIN HCL 500 MG PO TABS: 2000.0000 mg | ORAL_TABLET | Freq: Every day | ORAL | Status: DC

## 2011-02-02 MED ORDER — HYDROCODONE-ACETAMINOPHEN 10-325 MG PO TABS: 1.0000 | ORAL_TABLET | Freq: Every evening | ORAL | Status: AC | PRN

## 2011-02-02 MED ORDER — GLIMEPIRIDE 4 MG PO TABS: 4.0000 mg | ORAL_TABLET | Freq: Every day | ORAL | Status: DC

## 2011-02-02 NOTE — Telephone Encounter (Signed)
Message copied by Freddi Starr on Thu Feb 02, 2011 11:56 AM ------      Message from: Lewayne Bunting      Created: Mon Jan 30, 2011  9:18 AM       If not allergic add lisinopril 2.5 mg daily and check bmet one week      Olga Millers

## 2011-02-02 NOTE — Telephone Encounter (Signed)
Spoke with pt, aware of echo results. Script sent to pharm and paperwork mailed to pt for labs to be checked in one week in [place]

## 2011-02-02 NOTE — Assessment & Plan Note (Signed)
Has previously responded to prednisone in the remote past, but with wt gain and recent CV concerns, I supsect may be more volume related;  To incr the lasix to 40 bid for 3 days, then resume 40 qam, watch daily wts at home, f/u any worsening s/s

## 2011-02-02 NOTE — Progress Notes (Signed)
Subjective:    Patient ID: [name], male    DOB: [birth year], 66 y.o.   MRN: [id number]  HPI  Here to f/u; overall doing ok,  Pt denies chest pain, increased sob or doe, wheezing, orthopnea, PND, increased LE swelling, palpitations, dizziness or syncope since last seen per cardiology.  Pt denies new neurological symptoms such as new headache, or facial or extremity weakness or numbness   Pt denies polydipsia, polyuria, or low sugar symptoms such as weakness or confusion improved with po intake.  Pt states overall good compliance with meds, trying to follow lower cholesterol, diabetic diet, wt increased from 234 to 241 today since sept 2012 but little exercise however.  Plans to start more exercise at gym - does not want more OHA for DM.   Pt continues to have recurring LBP without change in severity, bowel or bladder change, fever, wt loss,  worsening LE pain/numbness/weakness, gait change or falls.  Cant wear the current lumbar support due to skin irritation per pt.  Due for pain med refills/ Past Medical History  Diagnosis Date  . 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 DIABETES MELLITUS, TYPE II 08/29/2006  . HYPERLIPIDEMIA 08/29/2006  . GOUT 04/22/2007  . Overweight 08/29/2006  . ERECTILE DYSFUNCTION 11/26/2006  . DEPRESSION 08/29/2006  . HYPERTENSION 08/29/2006  . CORONARY ARTERY DISEASE 08/29/2006  . 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 DIASTOLIC HEART FAILURE, CHRONIC 06/09/2008  . BENIGN PROSTATIC HYPERTROPHY 08/29/2006  . LUMBAR RADICULOPATHY, RIGHT 06/10/2007  . INSOMNIA-SLEEP DISORDER-UNSPEC 10/23/2007  . PACEMAKER, PERMANENT 03/04/2008    pt denies this date  . Left lumbar radiculopathy 05/30/2010  . Myocardial infarction 12/27/10    "I've had several MIs"  . Sleep apnea     "if I lay flat I quit breathing; HOB up I'm fine"  . Arthritis     "lower back; going back down both my sciatic nerves"  . Syncope and collapse 12/27/10    . Aortic root dilatation 02/02/2011   Past Surgical History  Procedure Date  . S/p left arm surgury after work accident 1991    "2000# steel fell on it"  . S/p right hand surgury for foreign object 1970's    "piece of wood went in my hand; had to get that out"  . Insert / replace / remove pacemaker ~ 2004    initial pacemaker placement  . Insert / replace / remove pacemaker 10/2009    generator change  . Coronary artery bypass graft 1992    CABG X 2  . Coronary angioplasty with stent placement 12/27/10    "I've had a total of 9 cardiac stents put in"    reports that he quit smoking about 40 years ago. His smoking use included Cigarettes. He has a 27 pack-year smoking history. He has quit using smokeless tobacco. He reports that he does not drink alcohol or use illicit drugs. family history includes Coronary artery disease (age of onset:50) in his other; Diabetes in his mother, other, and sister; and Heart disease in his sisters. Allergies  Allergen Reactions  . Crestor (Rosuvastatin Calcium) Other (See Comments)    Common to girls period.   Current Outpatient Prescriptions on File Prior to Visit  Medication Sig Dispense Refill  . aspirin EC 81 MG tablet Take 1 tablet (81 mg total) by mouth daily.      . NON FORMULARY Take 3 tablets by mouth daily. Random Ization 10908: Study Drug       .  NON FORMULARY Study drug 1 tab po qd       . triamcinolone (KENALOG) 0.5 % cream Apply 1 application topically 2 (two) times daily as needed. For break out of affected area.       Review of Systems Review of Systems  Constitutional: Negative for diaphoresis and unexpected weight change.  HENT: Negative for drooling and tinnitus.   Eyes: Negative for photophobia and visual disturbance.  Respiratory: Negative for choking and stridor.   Gastrointestinal: Negative for vomiting and blood in stool.  Genitourinary: Negative for hematuria and decreased urine volume.    Objective:   Physical Exam BP  110/68  Pulse 65  Temp(Src) 98.1 F (36.7 C) (Oral)  Ht 5\' 11"  (1.803 m)  Wt 241 lb 6 oz (109.487 kg)  BMI 33.66 kg/m2  SpO2 94% Physical Exam  VS noted Constitutional: Pt appears well-developed and well-nourished.  HENT: Head: Normocephalic.  Right Ear: External ear normal.  Left Ear: External ear normal.  Eyes: Conjunctivae and EOM are normal. Pupils are equal, round, and reactive to light.  Neck: Normal range of motion. Neck supple.  Cardiovascular: Normal rate and regular rhythm.   Pulmonary/Chest: Effort normal and breath sounds bilat mild wheeze  Abd:  Soft, NT, non-distended, + BS Neurological: Pt is alert. No cranial nerve deficit.  Motor/dtr intact, gait intact  Skin: Skin is warm. No erythema. , trace edema bilat LE's Psychiatric: Pt behavior is normal. Thought content normal.  Spine diffuse mild tender lumbar    Assessment & Plan:

## 2011-02-02 NOTE — Patient Instructions (Signed)
Please increase the lasix to 40 mg twice per day for 3 days (start today) and then resume previous dosing Continue all other medications as before Please keep your appointments with your specialists as you have planned - Dr Crenshaw/cardiology Please call or return if the wheezing/shortness of breath does not improve or worsens Please return in 3 months, or sooner if needed

## 2011-02-05 ENCOUNTER — Encounter: Payer: Self-pay | Admitting: Internal Medicine

## 2011-02-05 NOTE — Assessment & Plan Note (Signed)
stable overall by hx and exam, most recent data reviewed with pt, and pt to continue medical treatment as before  Lab Results  Component Value Date   HGBA1C 7.2* 10/24/2010

## 2011-02-05 NOTE — Assessment & Plan Note (Signed)
stable overall by hx and exam, most recent data reviewed with pt, and pt to continue medical treatment as before  BP Readings from Last 3 Encounters:  02/02/11 110/68  01/20/11 112/59  12/28/10 126/89

## 2011-02-05 NOTE — Assessment & Plan Note (Signed)
stable overall by hx and exam, most recent data reviewed with pt, and pt to continue medical treatment as before  Lab Results  Component Value Date   LDLCALC 45 10/24/2010    

## 2011-02-09 ENCOUNTER — Ambulatory Visit (INDEPENDENT_AMBULATORY_CARE_PROVIDER_SITE_OTHER): Payer: Medicare Other | Admitting: *Deleted

## 2011-02-09 DIAGNOSIS — I10 Essential (primary) hypertension: Secondary | ICD-10-CM

## 2011-02-09 LAB — BASIC METABOLIC PANEL
CO2: 27 mEq/L (ref 19–32)
Calcium: 8.8 mg/dL (ref 8.4–10.5)
Creatinine, Ser: 1.1 mg/dL (ref 0.4–1.5)
GFR: 75.35 mL/min (ref >60.00)
Sodium: 140 mEq/L (ref 135–145)

## 2011-02-20 ENCOUNTER — Ambulatory Visit (INDEPENDENT_AMBULATORY_CARE_PROVIDER_SITE_OTHER): Payer: Medicare Other | Admitting: Cardiology

## 2011-02-20 ENCOUNTER — Encounter: Payer: Self-pay | Admitting: Cardiology

## 2011-02-20 ENCOUNTER — Other Ambulatory Visit: Payer: Self-pay | Admitting: Cardiology

## 2011-02-20 DIAGNOSIS — I2510 Atherosclerotic heart disease of native coronary artery without angina pectoris: Secondary | ICD-10-CM

## 2011-02-20 DIAGNOSIS — I5032 Chronic diastolic (congestive) heart failure: Secondary | ICD-10-CM

## 2011-02-20 DIAGNOSIS — E785 Hyperlipidemia, unspecified: Secondary | ICD-10-CM

## 2011-02-20 DIAGNOSIS — I509 Heart failure, unspecified: Secondary | ICD-10-CM

## 2011-02-20 DIAGNOSIS — I10 Essential (primary) hypertension: Secondary | ICD-10-CM

## 2011-02-20 DIAGNOSIS — R55 Syncope and collapse: Secondary | ICD-10-CM

## 2011-02-20 DIAGNOSIS — Z950 Presence of cardiac pacemaker: Secondary | ICD-10-CM

## 2011-02-20 MED ORDER — LISINOPRIL 2.5 MG PO TABS: 2.5000 mg | ORAL_TABLET | Freq: Every day | ORAL | Status: DC

## 2011-02-20 NOTE — Assessment & Plan Note (Signed)
Continue aspirin and cholesterol study drug. 

## 2011-02-20 NOTE — Telephone Encounter (Signed)
PT CALLED BACK IS NOT TAKING ENALAPRIL INSTRUCTED TO RESTART  LISINOPRIL 2.5 MG  PT AWARE WILL FORWARD TO DR Jens Som FOR REVIEW./CY

## 2011-02-20 NOTE — Patient Instructions (Signed)
Your physician wants you to follow-up in: 6 months with dr Shelda Pal will receive a reminder letter in the mail two months in advance. If you don't receive a letter, please call our office to schedule the follow-up appointment. Your physician has recommended you make the following change in your medication: stop lisinopril

## 2011-02-20 NOTE — Assessment & Plan Note (Signed)
No recurrent episodes. 

## 2011-02-20 NOTE — Assessment & Plan Note (Signed)
Blood pressure controlled. Continue present medications. Patient taking to ACE inhibitors. Discontinue lisinopril.

## 2011-02-20 NOTE — Assessment & Plan Note (Signed)
Continue present dose of Lasix. 

## 2011-02-20 NOTE — Progress Notes (Signed)
ZOX:[id number] male with past medical history of coronary artery disease status post coronary artery bypass graft, previous pacemaker placement for followup. Patient had a generator change in September of 2011. Last cardiac catheterization in May of 2010 showed a normal left main, a 50-70% circumflex, a totally occluded LAD, and a focal 70% right coronary artery. The saphenous vein graft to the circumflex was occluded. The LIMA to the LAD was patent. Ejection fraction was 60%. Myoview in July of 2012 showed an ejection fraction of 51%, apical scar and mild ischemia; we are treating medically. Carotid Dopplers in October of 2011 were normal. Had syncopal episode in Nov 2011. Admitted and troponins normal. Pacemaker interrogation revealed no arrhythmias. Repeat echocardiogram in December of 2012 showed an ejection fraction of 40-45%, mild biatrial enlargement, mild aortic root dilatation and reduced RV function. Since I last saw him in Dec 2012, he does have dyspnea on exertion but no orthopnea, PND, chest pain or recurrent syncope. Occasional mild edema in the right lower extremity.   Current Outpatient Prescriptions  Medication Sig Dispense Refill  . allopurinol (ZYLOPRIM) 300 MG tablet Take 1 tablet (300 mg total) by mouth daily.  90 tablet  3  . aspirin EC 81 MG tablet Take 1 tablet (81 mg total) by mouth daily.      . carvedilol (COREG) 12.5 MG tablet Take 1 tablet (12.5 mg total) by mouth 2 (two) times daily.  180 tablet  3  . cyclobenzaprine (FLEXERIL) 5 MG tablet Take 1 tablet (5 mg total) by mouth 3 (three) times daily as needed. For muscle spasms  270 tablet  3  . enalapril (VASOTEC) 10 MG tablet Take 10 mg by mouth 2 (two) times daily.      . furosemide (LASIX) 40 MG tablet Take 40 mg by mouth daily.      Marland Kitchen gabapentin (NEURONTIN) 300 MG capsule Take 1 capsule (300 mg total) by mouth 3 (three) times daily.  270 capsule  1  . glimepiride (AMARYL) 4 MG tablet Take 1 tablet (4 mg total) by mouth  daily.  90 tablet  3  . metFORMIN (GLUCOPHAGE) 500 MG tablet Take 4 tablets (2,000 mg total) by mouth daily.  360 tablet  3  . NON FORMULARY Take 3 tablets by mouth daily. Random Ization 10908: Study Drug       . NON FORMULARY Study drug 1 tab po qd       . potassium chloride SA (K-DUR,KLOR-CON) 20 MEQ tablet Take 1 tablet (20 mEq total) by mouth daily.  90 tablet  3  . triamcinolone (KENALOG) 0.5 % cream Apply 1 application topically 2 (two) times daily as needed. For break out of affected area.         Past Medical History  Diagnosis Date  . 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 DIABETES MELLITUS, TYPE II 08/29/2006  . HYPERLIPIDEMIA 08/29/2006  . GOUT 04/22/2007  . Overweight 08/29/2006  . ERECTILE DYSFUNCTION 11/26/2006  . DEPRESSION 08/29/2006  . HYPERTENSION 08/29/2006  . CORONARY ARTERY DISEASE 08/29/2006  . 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 DIASTOLIC HEART FAILURE, CHRONIC 06/09/2008  . BENIGN PROSTATIC HYPERTROPHY 08/29/2006  . LUMBAR RADICULOPATHY, RIGHT 06/10/2007  . INSOMNIA-SLEEP DISORDER-UNSPEC 10/23/2007  . PACEMAKER, PERMANENT 03/04/2008    pt denies this date  . Left lumbar radiculopathy 05/30/2010  . Myocardial infarction 12/27/10    "I've had several MIs"  . Sleep apnea     "if I lay flat  I quit breathing; HOB up I'm fine"  . Arthritis     "lower back; going back down both my sciatic nerves"  . Syncope and collapse 12/27/10  . Aortic root dilatation 02/02/2011    Past Surgical History  Procedure Date  . S/p left arm surgury after work accident 1991    "2000# steel fell on it"  . S/p right hand surgury for foreign object 1970's    "piece of wood went in my hand; had to get that out"  . Insert / replace / remove pacemaker ~ 2004    initial pacemaker placement  . Insert / replace / remove pacemaker 10/2009    generator change  . Coronary artery bypass graft 1992    CABG X 2  . Coronary angioplasty with stent  placement 12/27/10    "I've had a total of 9 cardiac stents put in"    History   Social History  . Marital Status: Married    Spouse Name: N/A    Number of Children: 2  . Years of Education: N/A   Occupational History  . prior work Games developer   . disabled since 2004    Social History Main Topics  . Smoking status: Former Smoker -- 3.0 packs/day for 9 years    Types: Cigarettes    Quit date: 12/09/1970  . Smokeless tobacco: Former Neurosurgeon  . Alcohol Use: No  . Drug Use: No     "used pouches of tobacco for a long time; quit those 12/09/1970"  . Sexually Active: Yes   Other Topics Concern  . Not on file   Social History Narrative  . No narrative on file    ROS: Occasional leg pain but no fevers or chills, productive cough, hemoptysis, dysphasia, odynophagia, melena, hematochezia, dysuria, hematuria, rash, seizure activity, orthopnea, PND, claudication. Remaining systems are negative.  Physical Exam: Well-developed well-nourished in no acute distress.  Skin is warm and dry.  HEENT is normal.  Neck is supple. No thyromegaly.  Chest is clear to auscultation with normal expansion.  Cardiovascular exam is regular rate and rhythm.  Abdominal exam nontender or distended. No masses palpated. Extremities show trace edema. neuro grossly intact

## 2011-02-20 NOTE — Telephone Encounter (Signed)
New msg Pt said he is not taking enalapril He made mistake this morning please call him back

## 2011-02-20 NOTE — Assessment & Plan Note (Signed)
Continue study drug

## 2011-02-20 NOTE — Assessment & Plan Note (Signed)
Management per electrophysiology. 

## 2011-03-13 ENCOUNTER — Other Ambulatory Visit: Payer: Self-pay | Admitting: Internal Medicine

## 2011-03-22 ENCOUNTER — Encounter (INDEPENDENT_AMBULATORY_CARE_PROVIDER_SITE_OTHER): Payer: Medicare Other

## 2011-03-22 DIAGNOSIS — R0989 Other specified symptoms and signs involving the circulatory and respiratory systems: Secondary | ICD-10-CM

## 2011-04-11 ENCOUNTER — Telehealth: Payer: Self-pay

## 2011-04-11 MED ORDER — TIZANIDINE HCL 4 MG PO TABS: 4.0000 mg | ORAL_TABLET | Freq: Four times a day (QID) | ORAL | Status: DC | PRN

## 2011-04-11 NOTE — Telephone Encounter (Signed)
Received PA for Cyclobenzaprine please advise to proceed or offer alternative

## 2011-04-11 NOTE — Telephone Encounter (Signed)
I sent rx for tizanidine asd, but please let pt know it appears that his insurance doesn't pay for muscle relaxers, and he may be required to pay out of pocket

## 2011-04-11 NOTE — Telephone Encounter (Signed)
Would the pharmacist be able to let us know which similar med is covered for this pt?

## 2011-04-11 NOTE — Telephone Encounter (Signed)
Called the pharmacist and she did not know of alternative.

## 2011-04-12 NOTE — Telephone Encounter (Signed)
Patient informed. 

## 2011-05-03 ENCOUNTER — Encounter: Payer: Self-pay | Admitting: Internal Medicine

## 2011-05-03 ENCOUNTER — Ambulatory Visit (INDEPENDENT_AMBULATORY_CARE_PROVIDER_SITE_OTHER): Payer: Medicare Other | Admitting: Internal Medicine

## 2011-05-03 VITALS — BP 122/70 | HR 68 | Temp 97.1°F | Ht 71.0 in | Wt 246.0 lb

## 2011-05-03 DIAGNOSIS — E119 Type 2 diabetes mellitus without complications: Secondary | ICD-10-CM

## 2011-05-03 DIAGNOSIS — G8929 Other chronic pain: Secondary | ICD-10-CM

## 2011-05-03 DIAGNOSIS — M545 Low back pain: Secondary | ICD-10-CM

## 2011-05-03 DIAGNOSIS — E663 Overweight: Secondary | ICD-10-CM

## 2011-05-03 DIAGNOSIS — C61 Malignant neoplasm of prostate: Secondary | ICD-10-CM

## 2011-05-03 DIAGNOSIS — J309 Allergic rhinitis, unspecified: Secondary | ICD-10-CM

## 2011-05-03 MED ORDER — CETIRIZINE HCL 10 MG PO TABS: 10.0000 mg | ORAL_TABLET | Freq: Every day | ORAL | Status: DC

## 2011-05-03 MED ORDER — HYDROCODONE-ACETAMINOPHEN 10-325 MG PO TABS: 1.0000 | ORAL_TABLET | Freq: Three times a day (TID) | ORAL | Status: DC | PRN

## 2011-05-03 NOTE — Assessment & Plan Note (Signed)
D/w pt - I would not use phentermine in his case due to hx of heart disease and recent syncope;  Needs to incr activity as ablve and less calories

## 2011-05-03 NOTE — Assessment & Plan Note (Signed)
?   Control  - for a1c, likely may need more oha -  to f/u any worsening symptoms or concerns

## 2011-05-03 NOTE — Patient Instructions (Addendum)
Take all new medications as prescribed - the zyrtec Your hydrocodone was increased today Continue all other medications as before Please go to LAB in the Basement for the blood and/or urine tests to be done at your convenience You will be contacted by phone if any changes need to be made immediately.  Otherwise, you will receive a letter about your results with an explanation. Please return in 6 months, or sooner if needed

## 2011-05-03 NOTE — Progress Notes (Signed)
Subjective:    Patient ID: [name], male    DOB: 30-Apr-1945, 66 y.o.   MRN: [id number]  HPI  Here to f/u with c/o uncontrolled appetitie, wt gain from 234 to 246 current since sept 2012,  curretnly retired, does sowing for hobby, does walk approx 1.5 - 2 miles qod, with some wt lifting the other days (at the gym at [employer], with a 1 yo friend);  Doew yardwork as well,  Denies worsening depressive symptoms, suicidal ideation, or panic.  Pt denies chest pain, increased sob or doe, wheezing, orthopnea, PND, increased LE swelling, palpitations, though had syncope episode nov 2012, none since. eval neg per cardiology.  No hx of sz.   Pt denies fever, wt loss, night sweats, loss of appetite, or other constitutional symptoms. Gets bored and does some walking during the day.  Blood sugar jan 2013 was > 200, last a1c 7.2 in sept 2012.  CBG s lately at [facility] been elevated as well in 200's. Pt continues to have recurring LBP with some incr in overall pain, needs hydrocodone at least 2-3 times per day, but no bowel or bladder change, fever, wt loss,  worsening LE pain/numbness/weakness, gait change or falls.  Seems to want phentermine for wt loss.  Has not seen urology for > 1 yr - "I never got a letter so I never went back."  States wife has doctor appt after this, will need to return for blood work   Also has increased nasal allergy symptoms in the past wk - with clear congestion, itch and sneeze, without fever, pain, ST, cough or wheezing.  Past Medical History  Diagnosis Date  . 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 DIABETES MELLITUS, TYPE II 08/29/2006  . HYPERLIPIDEMIA 08/29/2006  . GOUT 04/22/2007  . Overweight 08/29/2006  . ERECTILE DYSFUNCTION 11/26/2006  . DEPRESSION 08/29/2006  . HYPERTENSION 08/29/2006  . CORONARY ARTERY DISEASE 08/29/2006  . 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 DIASTOLIC HEART FAILURE, CHRONIC 06/09/2008  . BENIGN  PROSTATIC HYPERTROPHY 08/29/2006  . LUMBAR RADICULOPATHY, RIGHT 06/10/2007  . INSOMNIA-SLEEP DISORDER-UNSPEC 10/23/2007  . PACEMAKER, PERMANENT 03/04/2008    pt denies this date  . Left lumbar radiculopathy 05/30/2010  . Myocardial infarction 12/27/10    "I've had several MIs"  . Sleep apnea     "if I lay flat I quit breathing; HOB up I'm fine"  . Arthritis     "lower back; going back down both my sciatic nerves"  . Syncope and collapse 12/27/10  . Aortic root dilatation 02/02/2011   Past Surgical History  Procedure Date  . S/p left arm surgury after work accident 1991    "2000# steel fell on it"  . S/p right hand surgury for foreign object 1970's    "piece of wood went in my hand; had to get that out"  . Insert / replace / remove pacemaker ~ 2004    initial pacemaker placement  . Insert / replace / remove pacemaker 10/2009    generator change  . Coronary artery bypass graft 1992    CABG X 2  . Coronary angioplasty with stent placement 12/27/10    "I've had a total of 9 cardiac stents put in"    reports that he quit smoking about 40 years ago. His smoking use included Cigarettes. He has a 27 pack-year smoking history. He has quit using smokeless tobacco. He reports that he does not drink alcohol or use illicit  drugs. family history includes Coronary artery disease (age of onset:50) in his other; Diabetes in his mother, other, and sister; and Heart disease in his sisters. Allergies  Allergen Reactions  . Crestor (Rosuvastatin Calcium) Other (See Comments)    Common to girls period.   Current Outpatient Prescriptions on File Prior to Visit  Medication Sig Dispense Refill  . allopurinol (ZYLOPRIM) 300 MG tablet take 1 tablet by mouth once daily  90 tablet  3  . aspirin EC 81 MG tablet Take 1 tablet (81 mg total) by mouth daily.      . carvedilol (COREG) 12.5 MG tablet Take 1 tablet (12.5 mg total) by mouth 2 (two) times daily.  180 tablet  3  . cyclobenzaprine (FLEXERIL) 5 MG tablet  Take 1 tablet (5 mg total) by mouth 3 (three) times daily as needed. For muscle spasms  270 tablet  3  . furosemide (LASIX) 40 MG tablet Take 40 mg by mouth daily.      Marland Kitchen gabapentin (NEURONTIN) 300 MG capsule Take 1 capsule (300 mg total) by mouth 3 (three) times daily.  270 capsule  1  . glimepiride (AMARYL) 4 MG tablet Take 1 tablet (4 mg total) by mouth daily.  90 tablet  3  . lisinopril (PRINIVIL,ZESTRIL) 2.5 MG tablet Take 1 tablet (2.5 mg total) by mouth daily.      . metFORMIN (GLUCOPHAGE) 500 MG tablet Take 4 tablets (2,000 mg total) by mouth daily.  360 tablet  3  . potassium chloride SA (K-DUR,KLOR-CON) 20 MEQ tablet Take 1 tablet (20 mEq total) by mouth daily.  90 tablet  3  . triamcinolone (KENALOG) 0.5 % cream Apply 1 application topically 2 (two) times daily as needed. For break out of affected area.      . NON FORMULARY Take 3 tablets by mouth daily. Random Ization 10908: Study Drug       . NON FORMULARY Study drug 1 tab po qd       . DISCONTD: carvedilol (COREG) 12.5 MG tablet Take 1 tablet (12.5 mg total) by mouth 2 (two) times daily with a meal.  60 tablet  11   Review of Systems Review of Systems  Constitutional: Negative for diaphoresis and unexpected weight change.  HENT: Negative for drooling and tinnitus.   Eyes: Negative for photophobia and visual disturbance.  Respiratory: Negative for choking and stridor.   Gastrointestinal: Negative for vomiting and blood in stool.  Genitourinary: Negative for hematuria and decreased urine volume.  Skin: Negative for color change and wound.  Neurological: Negative for tremors and numbness.  Psychiatric/Behavioral: Negative for decreased concentration. The patient is not hyperactive.       Objective:   Physical Exam BP 122/70  Pulse 68  Temp(Src) 97.1 F (36.2 C) (Oral)  Ht 5\' 11"  (1.803 m)  Wt 246 lb (111.585 kg)  BMI 34.31 kg/m2  SpO2 95% Physical Exam  VS noted, obese Constitutional: Pt appears well-developed and  well-nourished.  HENT: Head: Normocephalic.  Right Ear: External ear normal.  Left Ear: External ear normal.  Eyes: Conjunctivae and EOM are normal. Pupils are equal, round, and reactive to light.  Neck: Normal range of motion. Neck supple.  Cardiovascular: Normal rate and regular rhythm.   Pulmonary/Chest: Effort normal and breath sounds normal.  Abd:  Soft, NT, non-distended, + BS Neurological: Pt is alert. LE motor/dtr/slr intact, gait ok, non wide based Skin: Skin is warm. No erythema.  Psychiatric: Pt behavior is normal. Thought content normal. 1+  nervous    Assessment & Plan:

## 2011-05-03 NOTE — Assessment & Plan Note (Signed)
To adjust hydrocodone,  to f/u any worsening symptoms or concerns, o/w stable

## 2011-05-03 NOTE — Assessment & Plan Note (Signed)
Mild to mod, for zyrtec course,  to f/u any worsening symptoms or concerns

## 2011-06-05 ENCOUNTER — Other Ambulatory Visit: Payer: Self-pay | Admitting: Cardiology

## 2011-07-03 ENCOUNTER — Ambulatory Visit (INDEPENDENT_AMBULATORY_CARE_PROVIDER_SITE_OTHER): Payer: Medicare Other | Admitting: *Deleted

## 2011-07-03 ENCOUNTER — Encounter: Payer: Self-pay | Admitting: Internal Medicine

## 2011-07-03 DIAGNOSIS — I442 Atrioventricular block, complete: Secondary | ICD-10-CM

## 2011-07-03 LAB — PACEMAKER DEVICE OBSERVATION
AL AMPLITUDE: 4 mv
AL THRESHOLD: 0.75 V
BAMS-0001: 175 {beats}/min
RV LEAD IMPEDENCE PM: 492 Ohm
RV LEAD THRESHOLD: 0.75 V
VENTRICULAR PACING PM: 100

## 2011-07-03 NOTE — Progress Notes (Signed)
PPM check 

## 2011-07-28 ENCOUNTER — Other Ambulatory Visit: Payer: Self-pay | Admitting: Internal Medicine

## 2011-07-28 MED ORDER — POTASSIUM CHLORIDE CRYS ER 20 MEQ PO TBCR: 20.0000 meq | EXTENDED_RELEASE_TABLET | Freq: Every day | ORAL | Status: DC

## 2011-07-30 ENCOUNTER — Other Ambulatory Visit: Payer: Self-pay | Admitting: Internal Medicine

## 2011-08-08 ENCOUNTER — Ambulatory Visit (INDEPENDENT_AMBULATORY_CARE_PROVIDER_SITE_OTHER): Payer: Medicare Other | Admitting: Cardiology

## 2011-08-08 ENCOUNTER — Encounter: Payer: Self-pay | Admitting: Cardiology

## 2011-08-08 VITALS — BP 127/75 | HR 62 | Ht 71.0 in | Wt 239.0 lb

## 2011-08-08 DIAGNOSIS — I5032 Chronic diastolic (congestive) heart failure: Secondary | ICD-10-CM

## 2011-08-08 DIAGNOSIS — I10 Essential (primary) hypertension: Secondary | ICD-10-CM

## 2011-08-08 DIAGNOSIS — I2510 Atherosclerotic heart disease of native coronary artery without angina pectoris: Secondary | ICD-10-CM

## 2011-08-08 DIAGNOSIS — E785 Hyperlipidemia, unspecified: Secondary | ICD-10-CM

## 2011-08-08 DIAGNOSIS — Z950 Presence of cardiac pacemaker: Secondary | ICD-10-CM

## 2011-08-08 NOTE — Assessment & Plan Note (Signed)
Blood pressure controlled. Continue present medications. 

## 2011-08-08 NOTE — Assessment & Plan Note (Signed)
Continue aspirin and statin. Patient has asked about using Viagra. He is not on nitroglycerin. Patient has no contraindication as he is not having cardiac symptoms and last Myoview was low risk. I have asked him to obtain this from his primary care physician.

## 2011-08-08 NOTE — Patient Instructions (Signed)
Your physician wants you to follow-up in: ONE YEAR WITH DR CRENSHAW You will receive a reminder letter in the mail two months in advance. If you don't receive a letter, please call our office to schedule the follow-up appointment.  

## 2011-08-08 NOTE — Assessment & Plan Note (Signed)
Continue study drug

## 2011-08-08 NOTE — Assessment & Plan Note (Signed)
Patient euvolemic on examination. Continue present dose of diuretic. Potassium and renal function monitored by primary care.

## 2011-08-08 NOTE — Assessment & Plan Note (Signed)
Management per electrophysiology. 

## 2011-08-08 NOTE — Progress Notes (Signed)
HPI: Pleasant male with past medical history of coronary artery disease status post coronary artery bypass graft, previous pacemaker placement for followup. Patient had a generator change in September of 2011. Last cardiac catheterization in May of 2010 showed a normal left main, a 50-70% circumflex, a totally occluded LAD, and a focal 70% right coronary artery. The saphenous vein graft to the circumflex was occluded. The LIMA to the LAD was patent. Ejection fraction was 60%. Myoview in July of 2012 showed an ejection fraction of 51%, apical scar and mild ischemia; we are treating medically. Carotid Dopplers in October of 2011 were normal. Had syncopal episode in Nov 2011. Admitted and troponins normal. Pacemaker interrogation revealed no arrhythmias. Repeat echocardiogram in December of 2012 showed an ejection fraction of 40-45%, mild biatrial enlargement, mild aortic root dilatation and reduced RV function. Since I last saw him in Jan 2013, he does have dyspnea on exertion which is unchanged but no orthopnea, PND, chest pain or recurrent syncope. Occasional mild edema in the right lower extremity.   Current Outpatient Prescriptions  Medication Sig Dispense Refill  . allopurinol (ZYLOPRIM) 300 MG tablet take 1 tablet by mouth once daily  90 tablet  3  . aspirin EC 81 MG tablet Take 1 tablet (81 mg total) by mouth daily.      . carvedilol (COREG) 12.5 MG tablet take 1 tablet twice a day  60 tablet  8  . cetirizine (ZYRTEC) 10 MG tablet Take 1 tablet (10 mg total) by mouth daily.  90 tablet  3  . cyclobenzaprine (FLEXERIL) 5 MG tablet Take 1 tablet (5 mg total) by mouth 3 (three) times daily as needed. For muscle spasms  270 tablet  3  . docusate sodium (COLACE) 100 MG capsule Take 100 mg by mouth 2 (two) times daily as needed.      . furosemide (LASIX) 40 MG tablet Take 40 mg by mouth daily.      Marland Kitchen gabapentin (NEURONTIN) 300 MG capsule Take 1 capsule (300 mg total) by mouth 3 (three) times daily.   270 capsule  1  . glimepiride (AMARYL) 4 MG tablet Take 1 tablet (4 mg total) by mouth daily.  90 tablet  3  . HYDROcodone-acetaminophen (NORCO) 10-325 MG per tablet Take 1 tablet by mouth every 8 (eight) hours as needed.  60 tablet  5  . ibuprofen (ADVIL,MOTRIN) 200 MG tablet Take 800 mg by mouth every 6 (six) hours as needed.      Marland Kitchen lisinopril (PRINIVIL,ZESTRIL) 2.5 MG tablet Take 1 tablet (2.5 mg total) by mouth daily.      . metFORMIN (GLUCOPHAGE) 500 MG tablet TAKE 4 TABLETS ONCE DAILY  120 tablet  10  . Multiple Vitamin (MULTIVITAMIN) capsule Take 1 capsule by mouth daily.      . NON FORMULARY Take 3 tablets by mouth daily. Random Ization 10908: Study Drug       . NON FORMULARY Study drug 1 tab po qd       . potassium chloride SA (K-DUR,KLOR-CON) 20 MEQ tablet Take 1 tablet (20 mEq total) by mouth daily.  90 tablet  3  . triamcinolone (KENALOG) 0.5 % cream Apply 1 application topically 2 (two) times daily as needed. For break out of affected area.      Marland Kitchen DISCONTD: carvedilol (COREG) 12.5 MG tablet Take 1 tablet (12.5 mg total) by mouth 2 (two) times daily.  180 tablet  3  . DISCONTD: metFORMIN (GLUCOPHAGE) 500 MG tablet Take 4  tablets (2,000 mg total) by mouth daily.  360 tablet  3     Past Medical History  Diagnosis Date  . 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 DIABETES MELLITUS, TYPE II 08/29/2006  . HYPERLIPIDEMIA 08/29/2006  . GOUT 04/22/2007  . Overweight 08/29/2006  . ERECTILE DYSFUNCTION 11/26/2006  . DEPRESSION 08/29/2006  . HYPERTENSION 08/29/2006  . CORONARY ARTERY DISEASE 08/29/2006  . 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 DIASTOLIC HEART FAILURE, CHRONIC 06/09/2008  . BENIGN PROSTATIC HYPERTROPHY 08/29/2006  . LUMBAR RADICULOPATHY, RIGHT 06/10/2007  . INSOMNIA-SLEEP DISORDER-UNSPEC 10/23/2007  . PACEMAKER, PERMANENT 03/04/2008    pt denies this date  . Left lumbar radiculopathy 05/30/2010  . Myocardial infarction 12/27/10      "I've had several MIs"  . Sleep apnea     "if I lay flat I quit breathing; HOB up I'm fine"  . Arthritis     "lower back; going back down both my sciatic nerves"  . Syncope and collapse 12/27/10  . Aortic root dilatation 02/02/2011    Past Surgical History  Procedure Date  . S/p left arm surgury after work accident 1991    "2000# steel fell on it"  . S/p right hand surgury for foreign object 1970's    "piece of wood went in my hand; had to get that out"  . Insert / replace / remove pacemaker ~ 2004    initial pacemaker placement  . Insert / replace / remove pacemaker 10/2009    generator change  . Coronary artery bypass graft 1992    CABG X 2  . Coronary angioplasty with stent placement 12/27/10    "I've had a total of 9 cardiac stents put in"    History   Social History  . Marital Status: Married    Spouse Name: N/A    Number of Children: 2  . Years of Education: N/A   Occupational History  . prior work Games developer   . disabled since 2004    Social History Main Topics  . Smoking status: Former Smoker -- 3.0 packs/day for 9 years    Types: Cigarettes    Quit date: 12/09/1970  . Smokeless tobacco: Former Neurosurgeon  . Alcohol Use: No  . Drug Use: No     "used pouches of tobacco for a long time; quit those 12/09/1970"  . Sexually Active: Yes   Other Topics Concern  . Not on file   Social History Narrative  . No narrative on file    ROS: Problems with erectile dysfunction but no fevers or chills, productive cough, hemoptysis, dysphasia, odynophagia, melena, hematochezia, dysuria, hematuria, rash, seizure activity, orthopnea, PND, claudication. Remaining systems are negative.  Physical Exam: Well-developed well-nourished in no acute distress.  Skin is warm and dry.  HEENT is normal.  Neck is supple.  Chest is clear to auscultation with normal expansion.  Cardiovascular exam is regular rate and rhythm.  Abdominal exam nontender or distended. No masses  palpated. Extremities show no edema. neuro grossly intact  ECG sinus rhythm with ventricular pacing.

## 2011-08-27 ENCOUNTER — Other Ambulatory Visit: Payer: Self-pay | Admitting: Internal Medicine

## 2011-08-31 ENCOUNTER — Other Ambulatory Visit: Payer: Self-pay | Admitting: Internal Medicine

## 2011-09-07 ENCOUNTER — Other Ambulatory Visit: Payer: Self-pay | Admitting: Internal Medicine

## 2011-09-21 ENCOUNTER — Telehealth: Payer: Self-pay | Admitting: Cardiology

## 2011-09-21 NOTE — Telephone Encounter (Signed)
Please return call to patient [phone], regarding questions about medical care

## 2011-09-21 NOTE — Telephone Encounter (Signed)
Spoke with pt, he started coughing last night and got choked on the phylum, today he has a soreness in his chest. The discomfort increases with movement and inspiration. Explained to pt it does not sound like his heart, he reports it is different from heart pain he has had before. He will try tasking tylenol for the discomfort, claritin if needed for sinus drainage and something for his stomach if needed for any reflux or indigestion he may have from the coughing. If he cont to have discomfort he will give dr Raphael Gibney office a call. Pt agreed with this plan.

## 2011-09-27 ENCOUNTER — Other Ambulatory Visit (INDEPENDENT_AMBULATORY_CARE_PROVIDER_SITE_OTHER): Payer: Medicare Other

## 2011-09-27 ENCOUNTER — Encounter: Payer: Self-pay | Admitting: Internal Medicine

## 2011-09-27 ENCOUNTER — Ambulatory Visit (INDEPENDENT_AMBULATORY_CARE_PROVIDER_SITE_OTHER): Payer: Medicare Other | Admitting: Internal Medicine

## 2011-09-27 VITALS — BP 126/72 | HR 74 | Temp 99.5°F | Ht 71.0 in | Wt 235.0 lb

## 2011-09-27 DIAGNOSIS — C61 Malignant neoplasm of prostate: Secondary | ICD-10-CM

## 2011-09-27 DIAGNOSIS — E119 Type 2 diabetes mellitus without complications: Secondary | ICD-10-CM

## 2011-09-27 DIAGNOSIS — I10 Essential (primary) hypertension: Secondary | ICD-10-CM

## 2011-09-27 DIAGNOSIS — M549 Dorsalgia, unspecified: Secondary | ICD-10-CM

## 2011-09-27 LAB — BASIC METABOLIC PANEL
CO2: 30 mEq/L (ref 19–32)
Chloride: 100 mEq/L (ref 96–112)
Glucose, Bld: 147 mg/dL — ABNORMAL HIGH (ref 70–99)
Potassium: 4 mEq/L (ref 3.5–5.1)
Sodium: 138 mEq/L (ref 135–145)

## 2011-09-27 LAB — CBC WITH DIFFERENTIAL/PLATELET
Basophils Absolute: 0 10*3/uL (ref 0.0–0.1)
Eosinophils Absolute: 0.2 10*3/uL (ref 0.0–0.7)
Lymphocytes Relative: 24.2 % (ref 12.0–46.0)
MCHC: 32.6 g/dL (ref 30.0–36.0)
Monocytes Relative: 9.9 % (ref 3.0–12.0)
Neutro Abs: 6.3 10*3/uL (ref 1.4–7.7)
Platelets: 175 10*3/uL (ref 150.0–400.0)
RDW: 14.3 % (ref 11.5–14.6)

## 2011-09-27 LAB — HEPATIC FUNCTION PANEL
ALT: 27 U/L (ref 0–53)
AST: 23 U/L (ref 0–37)
Albumin: 4.1 g/dL (ref 3.5–5.2)
Alkaline Phosphatase: 74 U/L (ref 39–117)
Bilirubin, Direct: 0.1 mg/dL (ref 0.0–0.3)
Total Protein: 7.3 g/dL (ref 6.0–8.3)

## 2011-09-27 LAB — URINALYSIS, ROUTINE W REFLEX MICROSCOPIC
Hgb urine dipstick: NEGATIVE
Ketones, ur: NEGATIVE
Leukocytes, UA: NEGATIVE
Specific Gravity, Urine: 1.01 (ref 1.000–1.030)
Urine Glucose: NEGATIVE
Urobilinogen, UA: 0.2 (ref 0.0–1.0)

## 2011-09-27 LAB — HEMOGLOBIN A1C: Hgb A1c MFr Bld: 7.1 % — ABNORMAL HIGH (ref 4.6–6.5)

## 2011-09-27 LAB — LIPID PANEL
Cholesterol: 111 mg/dL (ref 0–200)
LDL Cholesterol: 55 mg/dL (ref 0–99)
Triglycerides: 99 mg/dL (ref 0.0–149.0)

## 2011-09-27 MED ORDER — METHYLPREDNISOLONE ACETATE 80 MG/ML IJ SUSP
120.0000 mg | Freq: Once | INTRAMUSCULAR | Status: AC
  Administered 2011-09-27: 120 mg via INTRAMUSCULAR

## 2011-09-27 MED ORDER — PREDNISONE 10 MG PO TABS: ORAL_TABLET | ORAL | Status: DC

## 2011-09-27 NOTE — Patient Instructions (Addendum)
You had the steroid shot today Take all new medications as prescribed - the lower dose prednisone Continue all other medications as before Please have the pharmacy call with any refills you may need. Please go to LAB in the Basement for the blood and/or urine tests to be done today You will be contacted by phone if any changes need to be made immediately.  Otherwise, you will receive a letter about your results with an explanation. Please return in 6 months, or sooner if needed

## 2011-09-28 ENCOUNTER — Telehealth: Payer: Self-pay | Admitting: Internal Medicine

## 2011-09-28 ENCOUNTER — Encounter: Payer: Self-pay | Admitting: Internal Medicine

## 2011-09-28 DIAGNOSIS — R972 Elevated prostate specific antigen [PSA]: Secondary | ICD-10-CM

## 2011-09-28 DIAGNOSIS — C61 Malignant neoplasm of prostate: Secondary | ICD-10-CM

## 2011-09-28 LAB — MICROALBUMIN / CREATININE URINE RATIO: Microalb Creat Ratio: 2.7 mg/g (ref 0.0–30.0)

## 2011-09-28 LAB — TSH: TSH: 2.53 u[IU]/mL (ref 0.35–5.50)

## 2011-09-28 NOTE — Telephone Encounter (Signed)
Referral done  Robin to fax last labs and ALL PSA's to [facility] urology

## 2011-09-28 NOTE — Telephone Encounter (Signed)
Message copied by Corwin Levins on Thu Sep 28, 2011  2:59 PM ------      Message from: Scharlene Gloss B      Created: Thu Sep 28, 2011  2:56 PM       Called the patient informed of results.  He does not have upcoming appt. With urologist and does not remember who he did see in the past.  The patient would like Dr. Jonny Ruiz to schedule appt. For him

## 2011-10-01 ENCOUNTER — Encounter: Payer: Self-pay | Admitting: Internal Medicine

## 2011-10-01 DIAGNOSIS — M549 Dorsalgia, unspecified: Secondary | ICD-10-CM | POA: Insufficient documentation

## 2011-10-01 NOTE — Assessment & Plan Note (Signed)
Acute on chronic, for depomderol IM, and predpack asd, Continue all other medications as before,  to f/u any worsening symptoms or concerns

## 2011-10-01 NOTE — Assessment & Plan Note (Signed)
stable overall by hx and exam, most recent data reviewed with pt, and pt to continue medical treatment as before BP Readings from Last 3 Encounters:  09/27/11 126/72  08/08/11 127/75  05/03/11 122/70

## 2011-10-01 NOTE — Assessment & Plan Note (Signed)
stable overall by hx and exam, most recent data reviewed with pt, and pt to continue medical treatment as before Lab Results  Component Value Date   HGBA1C 7.1* 09/27/2011   To cont to monitor cbg's, and call for > 200

## 2011-10-01 NOTE — Progress Notes (Signed)
Subjective:    Patient ID: [name], male    DOB: 01/30/1946, 66 y.o.   MRN: [id number]  HPI  Here to f/u, c/o moderate to severe 2-3 days flare/exacerbation of LBP with radiation to both knees without increased numbness or weakness; without bowel or bladder change, fever, wt loss,  gait change or falls except for walking slightly bent forward.  Has seen surgury, is nonsurgical candidate due to co-morbids;  Pain worse to flex hips, stand up, walk, better with pain meds, but is asking for steroid tx as this has been helpul in the past.  Pt denies chest pain, increased sob or doe, wheezing, orthopnea, PND, increased LE swelling, palpitations, dizziness or syncope.   Pt denies polydipsia, polyuria.  Pt denies new neurological symptoms such as new headache, or facial or extremity weakness or numbness. Denies worsening depressive symptoms, suicidal ideation, or panic, though has ongoing anxiety, Past Medical History  Diagnosis Date  . 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 DIABETES MELLITUS, TYPE II 08/29/2006  . HYPERLIPIDEMIA 08/29/2006  . GOUT 04/22/2007  . Overweight 08/29/2006  . ERECTILE DYSFUNCTION 11/26/2006  . DEPRESSION 08/29/2006  . HYPERTENSION 08/29/2006  . CORONARY ARTERY DISEASE 08/29/2006  . 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 DIASTOLIC HEART FAILURE, CHRONIC 06/09/2008  . BENIGN PROSTATIC HYPERTROPHY 08/29/2006  . LUMBAR RADICULOPATHY, RIGHT 06/10/2007  . INSOMNIA-SLEEP DISORDER-UNSPEC 10/23/2007  . PACEMAKER, PERMANENT 03/04/2008    pt denies this date  . Left lumbar radiculopathy 05/30/2010  . Myocardial infarction 12/27/10    "I've had several MIs"  . Sleep apnea     "if I lay flat I quit breathing; HOB up I'm fine"  . Arthritis     "lower back; going back down both my sciatic nerves"  . Syncope and collapse 12/27/10  . Aortic root dilatation 02/02/2011   Past Surgical History  Procedure Date  . S/p left arm surgury  after work accident 1991    "2000# steel fell on it"  . S/p right hand surgury for foreign object 1970's    "piece of wood went in my hand; had to get that out"  . Insert / replace / remove pacemaker ~ 2004    initial pacemaker placement  . Insert / replace / remove pacemaker 10/2009    generator change  . Coronary artery bypass graft 1992    CABG X 2  . Coronary angioplasty with stent placement 12/27/10    "I've had a total of 9 cardiac stents put in"    reports that he quit smoking about 40 years ago. His smoking use included Cigarettes. He has a 27 pack-year smoking history. He has quit using smokeless tobacco. He reports that he does not drink alcohol or use illicit drugs. family history includes Coronary artery disease (age of onset:50) in his other; Diabetes in his mother, other, and sister; and Heart disease in his sisters. Allergies  Allergen Reactions  . Crestor (Rosuvastatin Calcium) Other (See Comments)    Common to girls period.   Current Outpatient Prescriptions on File Prior to Visit  Medication Sig Dispense Refill  . allopurinol (ZYLOPRIM) 300 MG tablet take 1 tablet by mouth once daily  90 tablet  3  . aspirin EC 81 MG tablet Take 1 tablet (81 mg total) by mouth daily.      . carvedilol (COREG) 12.5 MG tablet take 1 tablet twice a day  60 tablet  8  . cetirizine (ZYRTEC) 10 MG  tablet Take 1 tablet (10 mg total) by mouth daily.  90 tablet  3  . cyclobenzaprine (FLEXERIL) 5 MG tablet Take 1 tablet (5 mg total) by mouth 3 (three) times daily as needed. For muscle spasms  270 tablet  3  . docusate sodium (COLACE) 100 MG capsule Take 100 mg by mouth 2 (two) times daily as needed.      . furosemide (LASIX) 40 MG tablet Take 40 mg by mouth daily.      Marland Kitchen gabapentin (NEURONTIN) 300 MG capsule TAKE ONE CAPSULE THREE TIMES A DAY  270 capsule  1  . glimepiride (AMARYL) 4 MG tablet Take 1 tablet (4 mg total) by mouth daily.  90 tablet  3  . glimepiride (AMARYL) 4 MG tablet take 1  tablet once daily  90 tablet  3  . HYDROcodone-acetaminophen (NORCO) 10-325 MG per tablet Take 1 tablet by mouth every 8 (eight) hours as needed.  60 tablet  5  . ibuprofen (ADVIL,MOTRIN) 200 MG tablet Take 800 mg by mouth every 6 (six) hours as needed.      Marland Kitchen lisinopril (PRINIVIL,ZESTRIL) 2.5 MG tablet Take 1 tablet (2.5 mg total) by mouth daily.      . metFORMIN (GLUCOPHAGE) 500 MG tablet TAKE 4 TABLETS ONCE DAILY  120 tablet  10  . Multiple Vitamin (MULTIVITAMIN) capsule Take 1 capsule by mouth daily.      . NON FORMULARY Take 3 tablets by mouth daily. Random Ization 10908: Study Drug       . NON FORMULARY Study drug 1 tab po qd       . potassium chloride SA (K-DUR,KLOR-CON) 20 MEQ tablet Take 1 tablet (20 mEq total) by mouth daily.  90 tablet  3  . tiZANidine (ZANAFLEX) 4 MG tablet TAKE 1 TABLET BY MOUTH EVERY 6 HOURS AS NEEDED  60 tablet  1  . triamcinolone (KENALOG) 0.5 % cream Apply 1 application topically 2 (two) times daily as needed. For break out of affected area.       Review of Systems Review of Systems  Constitutional: Negative for diaphoresis and unexpected weight change.  HENT: Negative for tinnitus.   Eyes: Negative for visual disturbance.  Respiratory: Negative for stridor.   Gastrointestinal: Negative for vomiting and blood in stool.  Genitourinary: Negative for hematuria and decreased urine volume.  Musculoskeletal: Negative for acute joint swelling Skin: Negative for color change and wound.  Neurological: Negative for tremors and numbness.  Psychiatric/Behavioral: Negative for decreased concentration. The patient is not hyperactive.      Objective:   Physical Exam BP 126/72  Pulse 74  Temp 99.5 F (37.5 C) (Oral)  Ht 5\' 11"  (1.803 m)  Wt 235 lb (106.595 kg)  BMI 32.78 kg/m2  SpO2 95% Physical Exam  VS noted Constitutional: Pt appears well-developed and well-nourished.  HENT: Head: Normocephalic.  Right Ear: External ear normal.  Left Ear: External ear  normal.  Eyes: Conjunctivae and EOM are normal. Pupils are equal, round, and reactive to light.  Neck: Normal range of motion. Neck supple.  Cardiovascular: Normal rate and regular rhythm.   Pulmonary/Chest: Effort normal and breath sounds normal.  Abd:  Soft, NT, non-distended, +BS, no flank tender Spine with diffuse lower lumbar tender midlins and bilat lumbar paravertebral Neurological: Pt is alert. No cranial nerve deficit. motor/dtr/gait intact but walks stooped at the waist due to pain  Skin: Skin is warm. No erythema. No ulcer Psychiatric: Pt behavior is normal. Thought content normal. 1+ nervous  Assessment & Plan:

## 2011-10-01 NOTE — Assessment & Plan Note (Signed)
Also due or PSA f/u,  to f/u any worsening symptoms or concerns

## 2011-10-04 ENCOUNTER — Encounter: Payer: Self-pay | Admitting: Internal Medicine

## 2011-10-04 ENCOUNTER — Ambulatory Visit (INDEPENDENT_AMBULATORY_CARE_PROVIDER_SITE_OTHER): Payer: Medicare Other | Admitting: Internal Medicine

## 2011-10-04 VITALS — BP 126/64 | HR 64 | Ht 71.0 in | Wt 235.8 lb

## 2011-10-04 DIAGNOSIS — E785 Hyperlipidemia, unspecified: Secondary | ICD-10-CM

## 2011-10-04 DIAGNOSIS — I2510 Atherosclerotic heart disease of native coronary artery without angina pectoris: Secondary | ICD-10-CM

## 2011-10-04 DIAGNOSIS — I442 Atrioventricular block, complete: Secondary | ICD-10-CM

## 2011-10-04 DIAGNOSIS — Z950 Presence of cardiac pacemaker: Secondary | ICD-10-CM

## 2011-10-04 LAB — PACEMAKER DEVICE OBSERVATION
AL AMPLITUDE: 2.8 mv
AL THRESHOLD: 0.5 V
ATRIAL PACING PM: 29
BAMS-0001: 175 {beats}/min
RV LEAD IMPEDENCE PM: 569 Ohm
RV LEAD THRESHOLD: 0.75 V

## 2011-10-04 NOTE — Patient Instructions (Addendum)
Your physician wants you to follow-up in:6 months in the device clinic in [place] 12 months with Dr Ladona Ridgel in [place] You will receive a reminder letter in the mail two months in advance. If you don't receive a letter, please call our office to schedule the follow-up appointment.

## 2011-10-11 ENCOUNTER — Encounter: Payer: Self-pay | Admitting: Internal Medicine

## 2011-10-11 NOTE — Assessment & Plan Note (Signed)
He denies anginal symptoms. He will continue his current medical therapy. I've encouraged the patient to increase his physical activity. 

## 2011-10-11 NOTE — Progress Notes (Signed)
HPI [name] returns today for followup. He is a 66 year old man with coronary artery disease status post bypass surgery, status post multiple coronary stents, symptomatic bradycardia, status post permanent pacemaker insertion, hypertension, and dyslipidemia. He has underlying complete heart block. In the interim, he denies chest pain or shortness of breath. No peripheral edema. No syncope. Allergies  Allergen Reactions  . Crestor (Rosuvastatin Calcium) Other (See Comments)    Common to girls period.     Current Outpatient Prescriptions  Medication Sig Dispense Refill  . allopurinol (ZYLOPRIM) 300 MG tablet take 1 tablet by mouth once daily  90 tablet  3  . aspirin EC 81 MG tablet Take 1 tablet (81 mg total) by mouth daily.      . carvedilol (COREG) 12.5 MG tablet take 1 tablet twice a day  60 tablet  8  . cetirizine (ZYRTEC) 10 MG tablet Take 1 tablet (10 mg total) by mouth daily.  90 tablet  3  . cyclobenzaprine (FLEXERIL) 5 MG tablet Take 1 tablet (5 mg total) by mouth 3 (three) times daily as needed. For muscle spasms  270 tablet  3  . docusate sodium (COLACE) 100 MG capsule Take 100 mg by mouth 2 (two) times daily as needed.      . furosemide (LASIX) 40 MG tablet Take 40 mg by mouth daily.      Marland Kitchen gabapentin (NEURONTIN) 300 MG capsule TAKE ONE CAPSULE THREE TIMES A DAY  270 capsule  1  . glimepiride (AMARYL) 4 MG tablet Take 1 tablet (4 mg total) by mouth daily.  90 tablet  3  . HYDROcodone-acetaminophen (NORCO) 10-325 MG per tablet Take 1 tablet by mouth every 8 (eight) hours as needed.  60 tablet  5  . ibuprofen (ADVIL,MOTRIN) 200 MG tablet Take 800 mg by mouth every 6 (six) hours as needed.      Marland Kitchen lisinopril (PRINIVIL,ZESTRIL) 2.5 MG tablet Take 1 tablet (2.5 mg total) by mouth daily.      . metFORMIN (GLUCOPHAGE) 500 MG tablet TAKE 4 TABLETS ONCE DAILY  120 tablet  10  . Multiple Vitamin (MULTIVITAMIN) capsule Take 1 capsule by mouth daily.      . NON FORMULARY Take 3 tablets by  mouth daily. Random Ization 10908: Study Drug       . NON FORMULARY Study drug 1 tab po qd       . potassium chloride SA (K-DUR,KLOR-CON) 20 MEQ tablet Take 1 tablet (20 mEq total) by mouth daily.  90 tablet  3  . tiZANidine (ZANAFLEX) 4 MG tablet TAKE 1 TABLET BY MOUTH EVERY 6 HOURS AS NEEDED  60 tablet  1  . triamcinolone (KENALOG) 0.5 % cream Apply 1 application topically 2 (two) times daily as needed. For break out of affected area.         Past Medical History  Diagnosis Date  . 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 DIABETES MELLITUS, TYPE II 08/29/2006  . HYPERLIPIDEMIA 08/29/2006  . GOUT 04/22/2007  . Overweight 08/29/2006  . ERECTILE DYSFUNCTION 11/26/2006  . DEPRESSION 08/29/2006  . HYPERTENSION 08/29/2006  . CORONARY ARTERY DISEASE 08/29/2006  . 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 DIASTOLIC HEART FAILURE, CHRONIC 06/09/2008  . BENIGN PROSTATIC HYPERTROPHY 08/29/2006  . LUMBAR RADICULOPATHY, RIGHT 06/10/2007  . INSOMNIA-SLEEP DISORDER-UNSPEC 10/23/2007  . PACEMAKER, PERMANENT 03/04/2008    pt denies this date  . Left lumbar radiculopathy 05/30/2010  . Myocardial infarction 12/27/10    "I've  had several MIs"  . Sleep apnea     "if I lay flat I quit breathing; HOB up I'm fine"  . Arthritis     "lower back; going back down both my sciatic nerves"  . Syncope and collapse 12/27/10  . Aortic root dilatation 02/02/2011    ROS:   All systems reviewed and negative except as noted in the HPI.   Past Surgical History  Procedure Date  . S/p left arm surgury after work accident 1991    "2000# steel fell on it"  . S/p right hand surgury for foreign object 1970's    "piece of wood went in my hand; had to get that out"  . Insert / replace / remove pacemaker ~ 2004    initial pacemaker placement  . Insert / replace / remove pacemaker 10/2009    generator change  . Coronary artery bypass graft 1992    CABG X 2  . Coronary  angioplasty with stent placement 12/27/10    "I've had a total of 9 cardiac stents put in"     Family History  Problem Relation Age of Onset  . Diabetes Mother   . Diabetes Sister   . Heart disease Sister     2 sister died with heart disease  . Coronary artery disease Other 15    male, first degree relative  . Diabetes Other     1st degree relative  . Heart disease Sister      History   Social History  . Marital Status: Married    Spouse Name: N/A    Number of Children: 2  . Years of Education: N/A   Occupational History  . prior work Games developer   . disabled since 2004    Social History Main Topics  . Smoking status: Former Smoker -- 3.0 packs/day for 9 years    Types: Cigarettes    Quit date: 12/09/1970  . Smokeless tobacco: Former Neurosurgeon  . Alcohol Use: No  . Drug Use: No     "used pouches of tobacco for a long time; quit those 12/09/1970"  . Sexually Active: Yes   Other Topics Concern  . Not on file   Social History Narrative  . No narrative on file     BP 126/64  Pulse 64  Ht 5\' 11"  (1.803 m)  Wt 235 lb 12.8 oz (106.958 kg)  BMI 32.89 kg/m2  Physical Exam:  Well appearing NAD HEENT: Unremarkable Neck:  7 cm JVD, no thyromegally Lungs:  Clear with no wheezes, rales, or rhonchi. Well-healed pacemaker incision. HEART:  Regular rate rhythm, no murmurs, no rubs, no clicks Abd:  soft, positive bowel sounds, no organomegally, no rebound, no guarding Ext:  2 plus pulses, no edema, no cyanosis, no clubbing Skin:  No rashes no nodules Neuro:  CN II through XII intact, motor grossly intact  DEVICE  Normal device function.  See PaceArt for details.   Assess/Plan:

## 2011-10-11 NOTE — Assessment & Plan Note (Signed)
He will continue his current medical therapy. A low fat diet has been requested.

## 2011-10-11 NOTE — Assessment & Plan Note (Signed)
His Medtronic dual-chamber pacemaker is working normally. We'll plan to recheck in several months. 

## 2011-10-16 ENCOUNTER — Other Ambulatory Visit: Payer: Self-pay | Admitting: Internal Medicine

## 2011-10-16 NOTE — Telephone Encounter (Signed)
Faxed hardcopy to pharmacy. 

## 2011-10-16 NOTE — Telephone Encounter (Signed)
Done hardcopy to robin  

## 2011-10-18 ENCOUNTER — Ambulatory Visit (INDEPENDENT_AMBULATORY_CARE_PROVIDER_SITE_OTHER): Payer: Medicare Other

## 2011-10-18 DIAGNOSIS — Z23 Encounter for immunization: Secondary | ICD-10-CM

## 2011-10-19 ENCOUNTER — Telehealth: Payer: Self-pay | Admitting: Internal Medicine

## 2011-10-19 NOTE — Telephone Encounter (Signed)
LMTCB ./CY 

## 2011-10-19 NOTE — Telephone Encounter (Signed)
plz return call to patient [phone] regarding  Nondescript issues

## 2011-10-20 NOTE — Telephone Encounter (Signed)
Patient wanted to know when we are in the office  He has something for Dr Ladona Ridgel  He will bring it then

## 2011-10-20 NOTE — Telephone Encounter (Signed)
**Note De-Identified  Obfuscation** Pt's wife states that the pt. wants to talk with Dr. Lubertha Basque nurse. Will forward to [employer].

## 2011-10-21 ENCOUNTER — Other Ambulatory Visit: Payer: Self-pay | Admitting: Internal Medicine

## 2011-10-23 ENCOUNTER — Other Ambulatory Visit: Payer: Self-pay | Admitting: General Practice

## 2011-10-23 MED ORDER — GABAPENTIN 300 MG PO CAPS: 300.0000 mg | ORAL_CAPSULE | Freq: Three times a day (TID) | ORAL | Status: DC

## 2011-11-10 ENCOUNTER — Encounter: Payer: Self-pay | Admitting: Internal Medicine

## 2011-11-10 ENCOUNTER — Ambulatory Visit (INDEPENDENT_AMBULATORY_CARE_PROVIDER_SITE_OTHER): Payer: Medicare Other | Admitting: Internal Medicine

## 2011-11-10 VITALS — BP 120/62 | HR 60 | Temp 97.1°F | Ht 70.0 in | Wt 239.4 lb

## 2011-11-10 DIAGNOSIS — E785 Hyperlipidemia, unspecified: Secondary | ICD-10-CM

## 2011-11-10 DIAGNOSIS — M5431 Sciatica, right side: Secondary | ICD-10-CM

## 2011-11-10 DIAGNOSIS — I10 Essential (primary) hypertension: Secondary | ICD-10-CM

## 2011-11-10 DIAGNOSIS — M5430 Sciatica, unspecified side: Secondary | ICD-10-CM

## 2011-11-10 DIAGNOSIS — E119 Type 2 diabetes mellitus without complications: Secondary | ICD-10-CM

## 2011-11-10 DIAGNOSIS — R972 Elevated prostate specific antigen [PSA]: Secondary | ICD-10-CM

## 2011-11-10 MED ORDER — PREDNISONE 10 MG PO TABS: ORAL_TABLET | ORAL | Status: DC

## 2011-11-10 MED ORDER — HYDROCODONE-ACETAMINOPHEN 10-325 MG PO TABS: 1.0000 | ORAL_TABLET | Freq: Three times a day (TID) | ORAL | Status: DC | PRN

## 2011-11-10 MED ORDER — METHYLPREDNISOLONE ACETATE 80 MG/ML IJ SUSP
120.0000 mg | Freq: Once | INTRAMUSCULAR | Status: AC
  Administered 2011-11-10: 120 mg via INTRAMUSCULAR

## 2011-11-10 MED ORDER — CYCLOBENZAPRINE HCL 5 MG PO TABS: 5.0000 mg | ORAL_TABLET | Freq: Three times a day (TID) | ORAL | Status: DC | PRN

## 2011-11-10 NOTE — Patient Instructions (Addendum)
You had the steroid shot today Take all new medications as prescribed  - the prednisone Your pain medication was refilled to you hardcopy today Continue all other medications as before, including the muscle relaxer Please call if you would like referral to orthopedic Please call if you change your mind about referral to urology Please have the pharmacy call with any other refills you may need. Please remember to sign up for My Chart at your earliest convenience, as this will be important to you in the future with finding out test results. You are otherwise up to date with prevention measures today Please return in 6 months, or sooner if needed

## 2011-11-10 NOTE — Progress Notes (Signed)
Subjective:    Patient ID: [name], male    DOB: 16-Oct-1945, 66 y.o.   MRN: [id number]  HPI  Here to f/u; overall doing ok,  Pt denies chest pain, increased sob or doe, wheezing, orthopnea, PND, increased LE swelling, palpitations, dizziness or syncope.  Pt denies new neurological symptoms such as new headache, or facial or extremity weakness or numbness   Pt denies polydipsia, polyuria, or low sugar symptoms such as weakness or confusion improved with po intake.  Pt states overall good compliance with meds, trying to follow lower cholesterol  diet, wt overall stable but little exercise however.   Denies urinary symptoms such as dysuria, frequency, urgency,or hematuria.  Pt continues to have recurring LBP without change in severity except more severe pain acutely in last 3 wks, no bowel or bladder change, fever, wt loss,  worsening LE pain/numbness/weakness, gait change or falls, but similar to pain last yr, better with steroid tx..  Does not want to see ortho now if possible Past Medical History  Diagnosis Date  . 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 DIABETES MELLITUS, TYPE II 08/29/2006  . HYPERLIPIDEMIA 08/29/2006  . GOUT 04/22/2007  . Overweight 08/29/2006  . ERECTILE DYSFUNCTION 11/26/2006  . DEPRESSION 08/29/2006  . HYPERTENSION 08/29/2006  . CORONARY ARTERY DISEASE 08/29/2006  . 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 DIASTOLIC HEART FAILURE, CHRONIC 06/09/2008  . BENIGN PROSTATIC HYPERTROPHY 08/29/2006  . LUMBAR RADICULOPATHY, RIGHT 06/10/2007  . INSOMNIA-SLEEP DISORDER-UNSPEC 10/23/2007  . PACEMAKER, PERMANENT 03/04/2008    pt denies this date  . Left lumbar radiculopathy 05/30/2010  . Myocardial infarction 12/27/10    "I've had several MIs"  . Sleep apnea     "if I lay flat I quit breathing; HOB up I'm fine"  . Arthritis     "lower back; going back down both my sciatic nerves"  . Syncope and collapse 12/27/10  . Aortic root  dilatation 02/02/2011   Past Surgical History  Procedure Date  . S/p left arm surgury after work accident 1991    "2000# steel fell on it"  . S/p right hand surgury for foreign object 1970's    "piece of wood went in my hand; had to get that out"  . Insert / replace / remove pacemaker ~ 2004    initial pacemaker placement  . Insert / replace / remove pacemaker 10/2009    generator change  . Coronary artery bypass graft 1992    CABG X 2  . Coronary angioplasty with stent placement 12/27/10    "I've had a total of 9 cardiac stents put in"    reports that he quit smoking about 40 years ago. His smoking use included Cigarettes. He has a 27 pack-year smoking history. He has quit using smokeless tobacco. He reports that he does not drink alcohol or use illicit drugs. family history includes Coronary artery disease (age of onset:50) in his other; Diabetes in his mother, other, and sister; and Heart disease in his sisters. Allergies  Allergen Reactions  . Crestor (Rosuvastatin Calcium) Other (See Comments)    Common to girls period.   Current Outpatient Prescriptions on File Prior to Visit  Medication Sig Dispense Refill  . allopurinol (ZYLOPRIM) 300 MG tablet take 1 tablet by mouth once daily  90 tablet  3  . aspirin EC 81 MG tablet Take 1 tablet (81 mg total) by mouth daily.      . carvedilol (COREG) 12.5 MG  tablet take 1 tablet twice a day  60 tablet  8  . cetirizine (ZYRTEC) 10 MG tablet Take 1 tablet (10 mg total) by mouth daily.  90 tablet  3  . docusate sodium (COLACE) 100 MG capsule Take 100 mg by mouth 2 (two) times daily as needed.      . furosemide (LASIX) 40 MG tablet Take 40 mg by mouth daily.      Marland Kitchen gabapentin (NEURONTIN) 300 MG capsule Take 1 capsule (300 mg total) by mouth 3 (three) times daily.  270 capsule  1  . glimepiride (AMARYL) 4 MG tablet Take 1 tablet (4 mg total) by mouth daily.  90 tablet  3  . ibuprofen (ADVIL,MOTRIN) 200 MG tablet Take 800 mg by mouth every 6  (six) hours as needed.      Marland Kitchen lisinopril (PRINIVIL,ZESTRIL) 2.5 MG tablet Take 1 tablet (2.5 mg total) by mouth daily.      . metFORMIN (GLUCOPHAGE) 500 MG tablet TAKE 4 TABLETS ONCE DAILY  120 tablet  10  . Multiple Vitamin (MULTIVITAMIN) capsule Take 1 capsule by mouth daily.      . NON FORMULARY Take 3 tablets by mouth daily. Random Ization 10908: Study Drug       . NON FORMULARY Study drug 1 tab po qd       . potassium chloride SA (K-DUR,KLOR-CON) 20 MEQ tablet Take 1 tablet (20 mEq total) by mouth daily.  90 tablet  3  . triamcinolone (KENALOG) 0.5 % cream Apply 1 application topically 2 (two) times daily as needed. For break out of affected area.       Review of Systems  Constitutional: Negative for diaphoresis and unexpected weight change.  HENT: Negative for tinnitus.   Eyes: Negative for photophobia and visual disturbance.  Respiratory: Negative for choking and stridor.   Gastrointestinal: Negative for vomiting and blood in stool.  Genitourinary: Negative for hematuria and decreased urine volume.  Musculoskeletal: Negative for gait problem.  Skin: Negative for color change and wound.  Neurological: Negative for tremors and numbness.  Psychiatric/Behavioral: Negative for decreased concentration. The patient is not hyperactive.       Objective:   Physical Exam BP 120/62  Pulse 60  Temp 97.1 F (36.2 C) (Oral)  Ht 5\' 10"  (1.778 m)  Wt 239 lb 6 oz (108.58 kg)  BMI 34.35 kg/m2  SpO2 96% Physical Exam  VS noted Constitutional: Pt appears well-developed and well-nourished.  HENT: Head: Normocephalic.  Right Ear: External ear normal.  Left Ear: External ear normal.  Eyes: Conjunctivae and EOM are normal. Pupils are equal, round, and reactive to light.  Neck: Normal range of motion. Neck supple.  Cardiovascular: Normal rate and regular rhythm.   Pulmonary/Chest: Effort normal and breath sounds normal.  Abd:  Soft, NT, non-distended, + BS Neurological: Pt is alert. Not  confused , motor/dtr/gait intacct Spine: nontender but has some mild right lumbar paravertebral tender Skin: Skin is warm. No erythema.  Psychiatric: Pt behavior is normal. Thought content normal.     Assessment & Plan:

## 2011-11-11 ENCOUNTER — Encounter: Payer: Self-pay | Admitting: Internal Medicine

## 2011-11-11 DIAGNOSIS — R972 Elevated prostate specific antigen [PSA]: Secondary | ICD-10-CM | POA: Insufficient documentation

## 2011-11-11 NOTE — Assessment & Plan Note (Signed)
stable overall by hx and exam, most recent data reviewed with pt, and pt to continue medical treatment as before Lab Results  Component Value Date   LDLCALC 55 09/27/2011

## 2011-11-11 NOTE — Assessment & Plan Note (Signed)
Mild acute worsening, for depomedrol IM, pain control, flxeril prn, and predpack asd, declines ortho

## 2011-11-11 NOTE — Assessment & Plan Note (Addendum)
D/w pt, still declines urology referal, though increased PSA is suspicious for worsenig prostate cancer

## 2011-11-11 NOTE — Assessment & Plan Note (Signed)
stable overall by hx and exam, most recent data reviewed with pt, and pt to continue medical treatment as before BP Readings from Last 3 Encounters:  11/10/11 120/62  10/04/11 126/64  09/27/11 126/72

## 2011-11-11 NOTE — Assessment & Plan Note (Signed)
stable overall by hx and exam, most recent data reviewed with pt, and pt to continue medical treatment as before Lab Results  Component Value Date   HGBA1C 7.1* 09/27/2011

## 2011-11-13 ENCOUNTER — Telehealth: Payer: Self-pay

## 2011-11-13 NOTE — Telephone Encounter (Signed)
Ok will forward to PCCs ?

## 2011-11-13 NOTE — Telephone Encounter (Signed)
The patient is requesting his urologist referral not be at [facility].  Stated he has been there once before and they know someone who works at [employer] and that person did not keep his visit there confidential. Please advise

## 2011-11-15 ENCOUNTER — Telehealth: Payer: Self-pay | Admitting: Internal Medicine

## 2011-11-15 DIAGNOSIS — C61 Malignant neoplasm of prostate: Secondary | ICD-10-CM

## 2011-11-15 DIAGNOSIS — R972 Elevated prostate specific antigen [PSA]: Secondary | ICD-10-CM

## 2011-11-15 NOTE — Telephone Encounter (Signed)
Message copied by Corwin Levins on Wed Nov 15, 2011 12:20 PM ------      Message from: Scharlene Gloss B      Created: Wed Nov 15, 2011 10:33 AM       The patient would like his urology referral to Dr. Charyl Dancer in [place] phone number [phone]

## 2011-11-15 NOTE — Telephone Encounter (Signed)
Done per emr 

## 2011-11-29 ENCOUNTER — Other Ambulatory Visit: Payer: Self-pay | Admitting: Internal Medicine

## 2011-12-14 ENCOUNTER — Encounter: Payer: Self-pay | Admitting: Internal Medicine

## 2011-12-14 ENCOUNTER — Ambulatory Visit (INDEPENDENT_AMBULATORY_CARE_PROVIDER_SITE_OTHER): Payer: Medicare Other | Admitting: Internal Medicine

## 2011-12-14 VITALS — BP 128/78 | HR 96 | Temp 97.4°F | Ht 71.0 in | Wt 236.1 lb

## 2011-12-14 DIAGNOSIS — R062 Wheezing: Secondary | ICD-10-CM

## 2011-12-14 DIAGNOSIS — J209 Acute bronchitis, unspecified: Secondary | ICD-10-CM

## 2011-12-14 DIAGNOSIS — E119 Type 2 diabetes mellitus without complications: Secondary | ICD-10-CM

## 2011-12-14 MED ORDER — HYDROCODONE-HOMATROPINE 5-1.5 MG/5ML PO SYRP: 5.0000 mL | ORAL_SOLUTION | Freq: Four times a day (QID) | ORAL | Status: DC | PRN

## 2011-12-14 MED ORDER — AZITHROMYCIN 250 MG PO TABS: ORAL_TABLET | ORAL | Status: DC

## 2011-12-14 MED ORDER — METHYLPREDNISOLONE ACETATE 80 MG/ML IJ SUSP
120.0000 mg | Freq: Once | INTRAMUSCULAR | Status: AC
  Administered 2011-12-14: 120 mg via INTRAMUSCULAR

## 2011-12-14 MED ORDER — PREDNISONE 10 MG PO TABS: ORAL_TABLET | ORAL | Status: DC

## 2011-12-14 NOTE — Assessment & Plan Note (Signed)
Mild to mod, for depomedrol IM,and predpack asd,  to f/u any worsening symptoms or concerns 

## 2011-12-14 NOTE — Patient Instructions (Addendum)
You had the steroid shot today Take all new medications as prescribed Continue all other medications as before Thank you for enrolling in MyChart. Please follow the instructions below to securely access your online medical record. MyChart allows you to send messages to your doctor, view your test results, renew your prescriptions, schedule appointments, and more.

## 2011-12-14 NOTE — Progress Notes (Signed)
Subjective:    Patient ID: [name], male    DOB: Jun 04, 1945, 66 y.o.   MRN: [id number]  HPI  Here with acute onset mild to mod 2-3 days ST, HA, general weakness and malaise, with prod cough greenish sputum, but Pt denies chest pain, increased sob or doe, wheezing, orthopnea, PND, increased LE swelling, palpitations, dizziness or syncope, except for onset wheezing last night.  Pt denies new neurological symptoms such as new headache, or facial or extremity weakness or numbness   Pt denies polydipsia, polyuria,   Pt states overall good compliance with meds, trying to follow lower cholesterol, diabetic diet, wt overall stable but little exercise however.    Past Medical History  Diagnosis Date  . 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 DIABETES MELLITUS, TYPE II 08/29/2006  . HYPERLIPIDEMIA 08/29/2006  . GOUT 04/22/2007  . Overweight 08/29/2006  . ERECTILE DYSFUNCTION 11/26/2006  . DEPRESSION 08/29/2006  . HYPERTENSION 08/29/2006  . CORONARY ARTERY DISEASE 08/29/2006  . 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 DIASTOLIC HEART FAILURE, CHRONIC 06/09/2008  . BENIGN PROSTATIC HYPERTROPHY 08/29/2006  . LUMBAR RADICULOPATHY, RIGHT 06/10/2007  . INSOMNIA-SLEEP DISORDER-UNSPEC 10/23/2007  . PACEMAKER, PERMANENT 03/04/2008    pt denies this date  . Left lumbar radiculopathy 05/30/2010  . Myocardial infarction 12/27/10    "I've had several MIs"  . Sleep apnea     "if I lay flat I quit breathing; HOB up I'm fine"  . Arthritis     "lower back; going back down both my sciatic nerves"  . Syncope and collapse 12/27/10  . Aortic root dilatation 02/02/2011   Past Surgical History  Procedure Date  . S/p left arm surgury after work accident 1991    "2000# steel fell on it"  . S/p right hand surgury for foreign object 1970's    "piece of wood went in my hand; had to get that out"  . Insert / replace / remove pacemaker ~ 2004    initial pacemaker placement    . Insert / replace / remove pacemaker 10/2009    generator change  . Coronary artery bypass graft 1992    CABG X 2  . Coronary angioplasty with stent placement 12/27/10    "I've had a total of 9 cardiac stents put in"    reports that he quit smoking about 41 years ago. His smoking use included Cigarettes. He has a 27 pack-year smoking history. He has quit using smokeless tobacco. He reports that he does not drink alcohol or use illicit drugs. family history includes Coronary artery disease (age of onset:50) in his other; Diabetes in his mother, other, and sister; and Heart disease in his sisters. Allergies  Allergen Reactions  . Crestor (Rosuvastatin Calcium) Other (See Comments)    Common to girls period.   Current Outpatient Prescriptions on File Prior to Visit  Medication Sig Dispense Refill  . allopurinol (ZYLOPRIM) 300 MG tablet take 1 tablet by mouth once daily  90 tablet  3  . aspirin EC 81 MG tablet Take 1 tablet (81 mg total) by mouth daily.      . carvedilol (COREG) 12.5 MG tablet take 1 tablet twice a day  60 tablet  8  . cetirizine (ZYRTEC) 10 MG tablet Take 1 tablet (10 mg total) by mouth daily.  90 tablet  3  . cyclobenzaprine (FLEXERIL) 5 MG tablet Take 1 tablet (5 mg total) by mouth 3 (three) times daily as needed. For  muscle spasms  270 tablet  3  . docusate sodium (COLACE) 100 MG capsule Take 100 mg by mouth 2 (two) times daily as needed.      . furosemide (LASIX) 40 MG tablet Take 40 mg by mouth daily.      Marland Kitchen gabapentin (NEURONTIN) 300 MG capsule Take 1 capsule (300 mg total) by mouth 3 (three) times daily.  270 capsule  1  . glimepiride (AMARYL) 4 MG tablet Take 1 tablet (4 mg total) by mouth daily.  90 tablet  3  . HYDROcodone-acetaminophen (NORCO) 10-325 MG per tablet Take 1 tablet by mouth every 8 (eight) hours as needed for pain.  90 tablet  3  . ibuprofen (ADVIL,MOTRIN) 200 MG tablet Take 800 mg by mouth every 6 (six) hours as needed.      Marland Kitchen lisinopril  (PRINIVIL,ZESTRIL) 2.5 MG tablet Take 1 tablet (2.5 mg total) by mouth daily.      . metFORMIN (GLUCOPHAGE) 500 MG tablet TAKE 4 TABLETS ONCE DAILY  120 tablet  10  . Multiple Vitamin (MULTIVITAMIN) capsule Take 1 capsule by mouth daily.      . NON FORMULARY Take 3 tablets by mouth daily. Random Ization 10908: Study Drug       . NON FORMULARY Study drug 1 tab po qd       . potassium chloride SA (K-DUR,KLOR-CON) 20 MEQ tablet Take 1 tablet (20 mEq total) by mouth daily.  90 tablet  3  . tiZANidine (ZANAFLEX) 4 MG tablet TAKE 1 TABLET BY MOUTH EVERY 6 HOURS AS NEEDED  30 tablet  1  . triamcinolone (KENALOG) 0.5 % cream Apply 1 application topically 2 (two) times daily as needed. For break out of affected area.       No current facility-administered medications on file prior to visit.   Review of Systems  Constitutional: Negative for diaphoresis and unexpected weight change.  HENT: Negative for tinnitus.   Eyes: Negative for photophobia and visual disturbance.  Respiratory: Negative for choking and stridor.   Gastrointestinal: Negative for vomiting and blood in stool.  Genitourinary: Negative for hematuria and decreased urine volume.  Musculoskeletal: Negative for gait problem.  Skin: Negative for color change and wound.  Neurological: Negative for tremors and numbness.  Psychiatric/Behavioral: Negative for decreased concentration. The patient is not hyperactive.       Objective:   Physical Exam BP 128/78  Pulse 96  Temp 97.4 F (36.3 C) (Oral)  Ht 5\' 11"  (1.803 m)  Wt 236 lb 2 oz (107.106 kg)  BMI 32.93 kg/m2  SpO2 96% Physical Exam  VS noted, mild ill Constitutional: Pt appears well-developed and well-nourished.  HENT: Head: Normocephalic.  Right Ear: External ear normal.  Left Ear: External ear normal.  Bilat tm's mild erythema.  Sinus nontender.  Pharynx mild erythema Eyes: Conjunctivae and EOM are normal. Pupils are equal, round, and reactive to light.  Neck: Normal range  of motion. Neck supple.  Cardiovascular: Normal rate and regular rhythm.   Pulmonary/Chest: Effort normal and breath sounds mild decrased with bilat mild wheezes  Neurological: Pt is alert. Not confused  Skin: Skin is warm. No erythema.  Psychiatric: Pt behavior is normal. Thought content normal.  Assessment & Plan:

## 2011-12-14 NOTE — Assessment & Plan Note (Signed)
Mild to mod, for antibx course,  to f/u any worsening symptoms or concerns 

## 2011-12-14 NOTE — Assessment & Plan Note (Signed)
stable overall by hx and exam, most recent data reviewed with pt, and pt to continue medical treatment as before Lab Results  Component Value Date   HGBA1C 7.1* 09/27/2011   Pt to call for onset polys or cbg > 200

## 2012-02-04 ENCOUNTER — Other Ambulatory Visit: Payer: Self-pay | Admitting: Cardiology

## 2012-02-06 ENCOUNTER — Other Ambulatory Visit: Payer: Self-pay | Admitting: Cardiology

## 2012-02-14 ENCOUNTER — Telehealth: Payer: Self-pay | Admitting: Cardiology

## 2012-02-14 MED ORDER — CARVEDILOL 25 MG PO TABS: ORAL_TABLET | ORAL | Status: DC

## 2012-02-14 NOTE — Telephone Encounter (Signed)
New problem:   Need to discuss medication issues.

## 2012-02-14 NOTE — Telephone Encounter (Signed)
Spoke with [name], they are trying to save the pt money on his scripts. New script sent ot pharm at their request.

## 2012-02-18 ENCOUNTER — Other Ambulatory Visit: Payer: Self-pay | Admitting: Internal Medicine

## 2012-03-25 ENCOUNTER — Other Ambulatory Visit: Payer: Self-pay | Admitting: Internal Medicine

## 2012-03-25 ENCOUNTER — Encounter (INDEPENDENT_AMBULATORY_CARE_PROVIDER_SITE_OTHER): Payer: Medicare Other

## 2012-03-25 ENCOUNTER — Encounter: Payer: Self-pay | Admitting: Internal Medicine

## 2012-03-25 ENCOUNTER — Ambulatory Visit (INDEPENDENT_AMBULATORY_CARE_PROVIDER_SITE_OTHER): Payer: Medicare Other | Admitting: *Deleted

## 2012-03-25 DIAGNOSIS — R0989 Other specified symptoms and signs involving the circulatory and respiratory systems: Secondary | ICD-10-CM

## 2012-03-25 DIAGNOSIS — I442 Atrioventricular block, complete: Secondary | ICD-10-CM

## 2012-03-25 LAB — PACEMAKER DEVICE OBSERVATION
AL AMPLITUDE: 5.6 mv
AL IMPEDENCE PM: 489 Ohm
BATTERY VOLTAGE: 2.79 V
RV LEAD AMPLITUDE: 11.2 mv
VENTRICULAR PACING PM: 100

## 2012-03-25 NOTE — Progress Notes (Signed)
Pacer check in clinic  

## 2012-04-01 ENCOUNTER — Other Ambulatory Visit: Payer: Self-pay | Admitting: General Practice

## 2012-04-01 DIAGNOSIS — M5431 Sciatica, right side: Secondary | ICD-10-CM

## 2012-04-01 DIAGNOSIS — J309 Allergic rhinitis, unspecified: Secondary | ICD-10-CM

## 2012-04-01 MED ORDER — GABAPENTIN 300 MG PO CAPS: 300.0000 mg | ORAL_CAPSULE | Freq: Three times a day (TID) | ORAL | Status: DC

## 2012-04-01 MED ORDER — CETIRIZINE HCL 10 MG PO TABS: 10.0000 mg | ORAL_TABLET | Freq: Every day | ORAL | Status: DC

## 2012-04-01 MED ORDER — TIZANIDINE HCL 4 MG PO TABS: ORAL_TABLET | ORAL | Status: DC

## 2012-04-01 MED ORDER — METFORMIN HCL 500 MG PO TABS: ORAL_TABLET | ORAL | Status: DC

## 2012-04-01 MED ORDER — GLIMEPIRIDE 4 MG PO TABS: 4.0000 mg | ORAL_TABLET | Freq: Every day | ORAL | Status: DC

## 2012-04-01 MED ORDER — ALLOPURINOL 300 MG PO TABS: ORAL_TABLET | ORAL | Status: DC

## 2012-04-01 MED ORDER — CYCLOBENZAPRINE HCL 5 MG PO TABS: 5.0000 mg | ORAL_TABLET | Freq: Three times a day (TID) | ORAL | Status: DC | PRN

## 2012-04-01 MED ORDER — HYDROCODONE-ACETAMINOPHEN 10-325 MG PO TABS: 1.0000 | ORAL_TABLET | Freq: Three times a day (TID) | ORAL | Status: DC | PRN

## 2012-04-01 NOTE — Telephone Encounter (Signed)
Meds refilled at [facility] nurse request and with Dr. Jonny Ruiz ok.

## 2012-04-02 ENCOUNTER — Other Ambulatory Visit: Payer: Self-pay | Admitting: *Deleted

## 2012-04-02 MED ORDER — FUROSEMIDE 40 MG PO TABS: 40.0000 mg | ORAL_TABLET | Freq: Every day | ORAL | Status: DC

## 2012-04-02 MED ORDER — POTASSIUM CHLORIDE CRYS ER 20 MEQ PO TBCR: 20.0000 meq | EXTENDED_RELEASE_TABLET | Freq: Every day | ORAL | Status: DC

## 2012-04-02 MED ORDER — CARVEDILOL 25 MG PO TABS: ORAL_TABLET | ORAL | Status: DC

## 2012-04-02 MED ORDER — LISINOPRIL 2.5 MG PO TABS: 2.5000 mg | ORAL_TABLET | Freq: Every day | ORAL | Status: DC

## 2012-04-17 ENCOUNTER — Other Ambulatory Visit: Payer: Self-pay | Admitting: Internal Medicine

## 2012-04-17 DIAGNOSIS — J309 Allergic rhinitis, unspecified: Secondary | ICD-10-CM

## 2012-04-17 DIAGNOSIS — M5431 Sciatica, right side: Secondary | ICD-10-CM

## 2012-04-17 MED ORDER — ALLOPURINOL 300 MG PO TABS: ORAL_TABLET | ORAL | Status: DC

## 2012-04-17 MED ORDER — FUROSEMIDE 40 MG PO TABS: 40.0000 mg | ORAL_TABLET | Freq: Every day | ORAL | Status: DC

## 2012-04-17 MED ORDER — POTASSIUM CHLORIDE CRYS ER 20 MEQ PO TBCR: 20.0000 meq | EXTENDED_RELEASE_TABLET | Freq: Every day | ORAL | Status: DC

## 2012-04-17 MED ORDER — CYCLOBENZAPRINE HCL 5 MG PO TABS: 5.0000 mg | ORAL_TABLET | Freq: Three times a day (TID) | ORAL | Status: DC | PRN

## 2012-04-17 MED ORDER — CETIRIZINE HCL 10 MG PO TABS: 10.0000 mg | ORAL_TABLET | Freq: Every day | ORAL | Status: DC

## 2012-04-17 MED ORDER — GLIMEPIRIDE 4 MG PO TABS: 4.0000 mg | ORAL_TABLET | Freq: Every day | ORAL | Status: DC

## 2012-04-17 MED ORDER — METFORMIN HCL 500 MG PO TABS: ORAL_TABLET | ORAL | Status: DC

## 2012-04-17 NOTE — Telephone Encounter (Signed)
To robin for med refills

## 2012-04-17 NOTE — Telephone Encounter (Signed)
Prescriptions done as requested by patient per [facility].  Controlled request sent to MD to ok and will call the patient to pickup once all completed as we cannot mail prescriptions.

## 2012-04-18 MED ORDER — GABAPENTIN 300 MG PO CAPS: 300.0000 mg | ORAL_CAPSULE | Freq: Three times a day (TID) | ORAL | Status: DC

## 2012-04-18 MED ORDER — HYDROCODONE-ACETAMINOPHEN 10-325 MG PO TABS: 1.0000 | ORAL_TABLET | Freq: Three times a day (TID) | ORAL | Status: DC | PRN

## 2012-04-18 NOTE — Telephone Encounter (Signed)
Done hardcopy to robin  

## 2012-04-18 NOTE — Telephone Encounter (Signed)
Patient and [facility] informed to pickup hardcopy's of all meds at the front desk.

## 2012-04-18 NOTE — Telephone Encounter (Signed)
[facility] informed prescriptions once signed and completed will be at the desk to pickup either by [facility] or patient.

## 2012-05-03 ENCOUNTER — Ambulatory Visit (INDEPENDENT_AMBULATORY_CARE_PROVIDER_SITE_OTHER): Payer: Medicare Other | Admitting: Internal Medicine

## 2012-05-03 ENCOUNTER — Encounter: Payer: Self-pay | Admitting: Internal Medicine

## 2012-05-03 ENCOUNTER — Ambulatory Visit (INDEPENDENT_AMBULATORY_CARE_PROVIDER_SITE_OTHER)
Admission: RE | Admit: 2012-05-03 | Discharge: 2012-05-03 | Disposition: A | Discharge status: Home / Self Care | Payer: Medicare Other | Source: Ambulatory Visit | Attending: Internal Medicine | Admitting: Internal Medicine

## 2012-05-03 VITALS — BP 112/64 | HR 92 | Temp 98.6°F | Ht 71.0 in | Wt 231.4 lb

## 2012-05-03 DIAGNOSIS — R51 Headache: Secondary | ICD-10-CM

## 2012-05-03 DIAGNOSIS — I10 Essential (primary) hypertension: Secondary | ICD-10-CM

## 2012-05-03 DIAGNOSIS — J069 Acute upper respiratory infection, unspecified: Secondary | ICD-10-CM | POA: Insufficient documentation

## 2012-05-03 DIAGNOSIS — R519 Headache, unspecified: Secondary | ICD-10-CM | POA: Insufficient documentation

## 2012-05-03 MED ORDER — LISINOPRIL 2.5 MG PO TABS: 2.5000 mg | ORAL_TABLET | Freq: Every day | ORAL | Status: DC

## 2012-05-03 MED ORDER — AZITHROMYCIN 250 MG PO TABS: ORAL_TABLET | ORAL | Status: DC

## 2012-05-03 MED ORDER — TIZANIDINE HCL 4 MG PO TABS: ORAL_TABLET | ORAL | Status: DC

## 2012-05-03 MED ORDER — ALLOPURINOL 300 MG PO TABS: ORAL_TABLET | ORAL | Status: DC

## 2012-05-03 MED ORDER — CARVEDILOL 25 MG PO TABS: ORAL_TABLET | ORAL | Status: DC

## 2012-05-03 MED ORDER — POTASSIUM CHLORIDE CRYS ER 20 MEQ PO TBCR: 20.0000 meq | EXTENDED_RELEASE_TABLET | Freq: Every day | ORAL | Status: DC

## 2012-05-03 MED ORDER — FUROSEMIDE 40 MG PO TABS: 40.0000 mg | ORAL_TABLET | Freq: Every day | ORAL | Status: DC

## 2012-05-03 MED ORDER — GLIMEPIRIDE 4 MG PO TABS: 4.0000 mg | ORAL_TABLET | Freq: Every day | ORAL | Status: DC

## 2012-05-03 MED ORDER — METFORMIN HCL 500 MG PO TABS: ORAL_TABLET | ORAL | Status: DC

## 2012-05-03 NOTE — Assessment & Plan Note (Signed)
stable overall by history and exam, recent data reviewed with pt, and pt to continue medical treatment as before,  to f/u any worsening symptoms or concerns  BP Readings from Last 3 Encounters:  05/03/12 112/64  12/14/11 128/78  11/10/11 120/62

## 2012-05-03 NOTE — Assessment & Plan Note (Signed)
Mild to mod, for antibx course,  to f/u any worsening symptoms or concerns 

## 2012-05-03 NOTE — Assessment & Plan Note (Signed)
Atypical, not clear related to current infectious symptoms, no signficant neuro changes but mod, persistent, hx of TIA, on ASA - for ct head r/o small cva even hemorrhagic

## 2012-05-03 NOTE — Patient Instructions (Addendum)
Please take all new medication as prescribed Please continue all other medications as before, and refills have been done if requested (per [facility] to [employer]) You will be contacted regarding the referral for: Head CT (no contrast) - to see PCC's now Please remember to sign up for My Chart if you have not done so, as this will be important to you in the future with finding out test results, communicating by private email, and scheduling acute appointments online when needed.

## 2012-05-03 NOTE — Progress Notes (Signed)
Subjective:    Patient ID: [name], male    DOB: 1945-10-29, 67 y.o.   MRN: [id number]  HPI  Here with c/o left sided HA - points to parietotemporal area, dull persistent for 3 days, mild to mod, denies throbbing/n/v/blurred vision or other vision change such as halo or flashing light, and Pt denies new neurological symptoms such as new or facial or extremity weakness or numbness.  Has chronic LLE distal weakness with hx of lower back.  Has hx of TIA, on ASA and good compliance. Does have numbness to right ulnar distribution area when lying on left side with arm bent at the elbow that resolves with straightening the arm. Has a recurring floater to the left vision but no overt visual field loss.  Has been somewhat weaker in general, lost several lbs recently but states intentional.  No falls or worsening gait change.   Does have mild URi symtpoms as well with sinus congestion, colored drainage, ? Low grade temp. No new speech diffuculty or bulbar symptoms.  Pt continues to have recurring LBP without change in severity, bowel or bladder change, fever, wt loss,  worsening LE pain/numbness/weakness, gait change or falls. No neck pain Past Medical History  Diagnosis Date  . 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 DIABETES MELLITUS, TYPE II 08/29/2006  . HYPERLIPIDEMIA 08/29/2006  . GOUT 04/22/2007  . Overweight 08/29/2006  . ERECTILE DYSFUNCTION 11/26/2006  . DEPRESSION 08/29/2006  . HYPERTENSION 08/29/2006  . CORONARY ARTERY DISEASE 08/29/2006  . 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 DIASTOLIC HEART FAILURE, CHRONIC 06/09/2008  . BENIGN PROSTATIC HYPERTROPHY 08/29/2006  . LUMBAR RADICULOPATHY, RIGHT 06/10/2007  . INSOMNIA-SLEEP DISORDER-UNSPEC 10/23/2007  . PACEMAKER, PERMANENT 03/04/2008    pt denies this date  . Left lumbar radiculopathy 05/30/2010  . Myocardial infarction 12/27/10    "I've had several MIs"  . Sleep apnea     "if I lay flat I  quit breathing; HOB up I'm fine"  . Arthritis     "lower back; going back down both my sciatic nerves"  . Syncope and collapse 12/27/10  . Aortic root dilatation 02/02/2011   Past Surgical History  Procedure Laterality Date  . S/p left arm surgury after work accident  1991    "2000# steel fell on it"  . S/p right hand surgury for foreign object  1970's    "piece of wood went in my hand; had to get that out"  . Insert / replace / remove pacemaker  ~ 2004    initial pacemaker placement  . Insert / replace / remove pacemaker  10/2009    generator change  . Coronary artery bypass graft  1992    CABG X 2  . Coronary angioplasty with stent placement  12/27/10    "I've had a total of 9 cardiac stents put in"    reports that he quit smoking about 41 years ago. His smoking use included Cigarettes. He has a 27 pack-year smoking history. He has quit using smokeless tobacco. He reports that he does not drink alcohol or use illicit drugs. family history includes Coronary artery disease (age of onset: 73) in his other; Diabetes in his mother, other, and sister; and Heart disease in his sisters. Allergies  Allergen Reactions  . Crestor (Rosuvastatin Calcium) Other (See Comments)    Common to girls period.   Current Outpatient Prescriptions on File Prior to Visit  Medication Sig Dispense Refill  . aspirin EC  81 MG tablet Take 1 tablet (81 mg total) by mouth daily.      . cetirizine (ZYRTEC) 10 MG tablet Take 1 tablet (10 mg total) by mouth daily.  30 tablet  11  . cyclobenzaprine (FLEXERIL) 5 MG tablet Take 1 tablet (5 mg total) by mouth 3 (three) times daily as needed. For muscle spasms  90 tablet  11  . docusate sodium (COLACE) 100 MG capsule Take 100 mg by mouth 2 (two) times daily as needed.      . gabapentin (NEURONTIN) 300 MG capsule Take 1 capsule (300 mg total) by mouth 3 (three) times daily.  270 capsule  1  . HYDROcodone-acetaminophen (NORCO) 10-325 MG per tablet Take 1 tablet by mouth  every 8 (eight) hours as needed for pain.  90 tablet  3  . ibuprofen (ADVIL,MOTRIN) 200 MG tablet Take 800 mg by mouth every 6 (six) hours as needed.      . Multiple Vitamin (MULTIVITAMIN) capsule Take 1 capsule by mouth daily.      . NON FORMULARY Take 3 tablets by mouth daily. Random Ization 10908: Study Drug       . NON FORMULARY Study drug 1 tab po qd       . triamcinolone (KENALOG) 0.5 % cream Apply 1 application topically 2 (two) times daily as needed. For break out of affected area.      Marland Kitchen HYDROcodone-homatropine (HYCODAN) 5-1.5 MG/5ML syrup Take 5 mLs by mouth every 6 (six) hours as needed for cough.  120 mL  1   No current facility-administered medications on file prior to visit.   Review of Systems  Constitutional: Negative for unexpected weight change, or unusual diaphoresis  HENT: Negative for tinnitus.   Eyes: Negative for photophobia and visual disturbance.  Respiratory: Negative for choking and stridor.   Gastrointestinal: Negative for vomiting and blood in stool.  Genitourinary: Negative for hematuria and decreased urine volume.  Musculoskeletal: Negative for acute joint swelling Skin: Negative for color change and wound.  Neurological: Negative for tremors and numbness other than noted  Psychiatric/Behavioral: Negative for decreased concentration or  hyperactivity.       Objective:   Physical Exam BP 112/64  Pulse 92  Temp(Src) 98.6 F (37 C) (Oral)  Ht 5\' 11"  (1.803 m)  Wt 231 lb 6 oz (104.951 kg)  BMI 32.28 kg/m2  SpO2 95% VS noted, mild ill appearing Constitutional: Pt appears well-developed and well-nourished.  HENT: Head: NCAT. Temple and head NT to palpation, no bruise or swelling Right Ear: External ear normal.  Left Ear: External ear normal.  Bilat tm's with mild erythema.  Max sinus areas mild tender.  Pharynx with mild erythema, no exudate Eyes: Conjunctivae and EOM are normal. Pupils are equal, round, and reactive to light.  Neck: Normal range of  motion. Neck supple.  Cardiovascular: Normal rate and regular rhythm.   Pulmonary/Chest: Effort normal and breath sounds normal.  Neurological: Pt is alert. Not confused , speech ok, cn 2-12 intact, motor 5/5 except for distal LLE 4+/5, gait intact Skin: Skin is warm. No erythema. No rash Psychiatric: Pt behavior is normal. Thought content normal.     Assessment & Plan:

## 2012-05-07 ENCOUNTER — Telehealth: Payer: Self-pay

## 2012-05-07 MED ORDER — AZITHROMYCIN 250 MG PO TABS: ORAL_TABLET | ORAL | Status: DC

## 2012-05-07 NOTE — Telephone Encounter (Signed)
Pharmacy did not received zpack on 05/03/12 went to wrong pharmacy.  Will fax to correct

## 2012-05-14 ENCOUNTER — Ambulatory Visit (INDEPENDENT_AMBULATORY_CARE_PROVIDER_SITE_OTHER): Payer: Medicare Other | Admitting: Internal Medicine

## 2012-05-14 ENCOUNTER — Other Ambulatory Visit: Payer: Self-pay | Admitting: Internal Medicine

## 2012-05-14 ENCOUNTER — Other Ambulatory Visit (INDEPENDENT_AMBULATORY_CARE_PROVIDER_SITE_OTHER): Payer: Medicare Other

## 2012-05-14 ENCOUNTER — Encounter: Payer: Self-pay | Admitting: Internal Medicine

## 2012-05-14 VITALS — BP 120/60 | HR 62 | Temp 98.2°F | Ht 71.0 in | Wt 234.5 lb

## 2012-05-14 DIAGNOSIS — E119 Type 2 diabetes mellitus without complications: Secondary | ICD-10-CM

## 2012-05-14 DIAGNOSIS — J309 Allergic rhinitis, unspecified: Secondary | ICD-10-CM

## 2012-05-14 DIAGNOSIS — I10 Essential (primary) hypertension: Secondary | ICD-10-CM

## 2012-05-14 DIAGNOSIS — Z0000 Encounter for general adult medical examination without abnormal findings: Secondary | ICD-10-CM

## 2012-05-14 DIAGNOSIS — R972 Elevated prostate specific antigen [PSA]: Secondary | ICD-10-CM

## 2012-05-14 LAB — BASIC METABOLIC PANEL
CO2: 26 mEq/L (ref 19–32)
Chloride: 100 mEq/L (ref 96–112)
Glucose, Bld: 148 mg/dL — ABNORMAL HIGH (ref 70–99)
Potassium: 4.1 mEq/L (ref 3.5–5.1)
Sodium: 136 mEq/L (ref 135–145)

## 2012-05-14 LAB — LIPID PANEL
HDL: 29.9 mg/dL — ABNORMAL LOW (ref >39.00)
Total CHOL/HDL Ratio: 3
Triglycerides: 86 mg/dL (ref 0.0–149.0)
VLDL: 17.2 mg/dL (ref 0.0–40.0)

## 2012-05-14 LAB — HEMOGLOBIN A1C: Hgb A1c MFr Bld: 8.1 % — ABNORMAL HIGH (ref 4.6–6.5)

## 2012-05-14 LAB — HEPATIC FUNCTION PANEL
Albumin: 4 g/dL (ref 3.5–5.2)
Total Bilirubin: 0.4 mg/dL (ref 0.3–1.2)

## 2012-05-14 MED ORDER — FLUTICASONE PROPIONATE 50 MCG/ACT NA SUSP: 2.0000 | Freq: Every day | NASAL | Status: DC

## 2012-05-14 MED ORDER — PIOGLITAZONE HCL 15 MG PO TABS: 15.0000 mg | ORAL_TABLET | Freq: Every day | ORAL | Status: DC

## 2012-05-14 MED ORDER — METHYLPREDNISOLONE ACETATE 80 MG/ML IJ SUSP
80.0000 mg | Freq: Once | INTRAMUSCULAR | Status: AC
  Administered 2012-05-14: 80 mg via INTRAMUSCULAR

## 2012-05-14 NOTE — Progress Notes (Signed)
Subjective:    Patient ID: [name], male    DOB: 05-Mar-1945, 67 y.o.   MRN: [id number]  HPI  Here to f/u; overall doing ok,  Pt denies chest pain, increased sob or doe, wheezing, orthopnea, PND, increased LE swelling, palpitations, dizziness or syncope.  Pt denies polydipsia, polyuria, or low sugar symptoms such as weakness or confusion improved with po intake.  Pt denies new neurological symptoms such as new headache, or facial or extremity weakness or numbness.   Pt states overall good compliance with meds, has been trying to follow lower cholesterol, diabetic diet, with wt overall stable,  but little exercise however. Does have several wks ongoing nasal allergy symptoms with clearish congestion, itch and sneezing, without fever, pain, ST, cough, swelling or wheezing.  Had recent elev PSA, has seen urology in [place], declined bx initially but plans to make f/u visit soon Past Medical History  Diagnosis Date  . 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 DIABETES MELLITUS, TYPE II 08/29/2006  . HYPERLIPIDEMIA 08/29/2006  . GOUT 04/22/2007  . Overweight 08/29/2006  . ERECTILE DYSFUNCTION 11/26/2006  . DEPRESSION 08/29/2006  . HYPERTENSION 08/29/2006  . CORONARY ARTERY DISEASE 08/29/2006  . 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 DIASTOLIC HEART FAILURE, CHRONIC 06/09/2008  . BENIGN PROSTATIC HYPERTROPHY 08/29/2006  . LUMBAR RADICULOPATHY, RIGHT 06/10/2007  . INSOMNIA-SLEEP DISORDER-UNSPEC 10/23/2007  . PACEMAKER, PERMANENT 03/04/2008    pt denies this date  . Left lumbar radiculopathy 05/30/2010  . Myocardial infarction 12/27/10    "I've had several MIs"  . Sleep apnea     "if I lay flat I quit breathing; HOB up I'm fine"  . Arthritis     "lower back; going back down both my sciatic nerves"  . Syncope and collapse 12/27/10  . Aortic root dilatation 02/02/2011   Past Surgical History  Procedure Laterality Date  . S/p left arm surgury after  work accident  1991    "2000# steel fell on it"  . S/p right hand surgury for foreign object  1970's    "piece of wood went in my hand; had to get that out"  . Insert / replace / remove pacemaker  ~ 2004    initial pacemaker placement  . Insert / replace / remove pacemaker  10/2009    generator change  . Coronary artery bypass graft  1992    CABG X 2  . Coronary angioplasty with stent placement  12/27/10    "I've had a total of 9 cardiac stents put in"    reports that he quit smoking about 41 years ago. His smoking use included Cigarettes. He has a 27 pack-year smoking history. He has quit using smokeless tobacco. He reports that he does not drink alcohol or use illicit drugs. family history includes Coronary artery disease (age of onset: 24) in his other; Diabetes in his mother, other, and sister; and Heart disease in his sisters. Allergies  Allergen Reactions  . Crestor (Rosuvastatin Calcium) Other (See Comments)    Common to girls period.   Current Outpatient Prescriptions on File Prior to Visit  Medication Sig Dispense Refill  . allopurinol (ZYLOPRIM) 300 MG tablet take 1 tablet by mouth once daily  30 tablet  11  . aspirin EC 81 MG tablet Take 1 tablet (81 mg total) by mouth daily.      . carvedilol (COREG) 25 MG tablet Take one half tablet twice daily  60 tablet  12  .  cetirizine (ZYRTEC) 10 MG tablet Take 1 tablet (10 mg total) by mouth daily.  30 tablet  11  . cyclobenzaprine (FLEXERIL) 5 MG tablet Take 1 tablet (5 mg total) by mouth 3 (three) times daily as needed. For muscle spasms  90 tablet  11  . docusate sodium (COLACE) 100 MG capsule Take 100 mg by mouth 2 (two) times daily as needed.      . furosemide (LASIX) 40 MG tablet Take 1 tablet (40 mg total) by mouth daily.  30 tablet  11  . gabapentin (NEURONTIN) 300 MG capsule Take 1 capsule (300 mg total) by mouth 3 (three) times daily.  270 capsule  1  . glimepiride (AMARYL) 4 MG tablet Take 1 tablet (4 mg total) by mouth  daily.  30 tablet  11  . HYDROcodone-acetaminophen (NORCO) 10-325 MG per tablet Take 1 tablet by mouth every 8 (eight) hours as needed for pain.  90 tablet  3  . ibuprofen (ADVIL,MOTRIN) 200 MG tablet Take 800 mg by mouth every 6 (six) hours as needed.      Marland Kitchen lisinopril (PRINIVIL,ZESTRIL) 2.5 MG tablet Take 1 tablet (2.5 mg total) by mouth daily.  30 tablet  12  . lisinopril (PRINIVIL,ZESTRIL) 2.5 MG tablet Take 1 tablet (2.5 mg total) by mouth daily.  30 tablet  11  . metFORMIN (GLUCOPHAGE) 500 MG tablet TAKE 4 TABLETS ONCE DAILY  120 tablet  11  . Multiple Vitamin (MULTIVITAMIN) capsule Take 1 capsule by mouth daily.      . NON FORMULARY Take 3 tablets by mouth daily. Random Ization 10908: Study Drug       . NON FORMULARY Study drug 1 tab po qd       . potassium chloride SA (K-DUR,KLOR-CON) 20 MEQ tablet Take 1 tablet (20 mEq total) by mouth daily.  30 tablet  11  . tiZANidine (ZANAFLEX) 4 MG tablet TAKE 1 TABLET BY MOUTH EVERY 6 HOURS AS NEEDED  30 tablet  11  . triamcinolone (KENALOG) 0.5 % cream Apply 1 application topically 2 (two) times daily as needed. For break out of affected area.      Marland Kitchen azithromycin (ZITHROMAX Z-PAK) 250 MG tablet Use as directed  6 each  1  . HYDROcodone-homatropine (HYCODAN) 5-1.5 MG/5ML syrup Take 5 mLs by mouth every 6 (six) hours as needed for cough.  120 mL  1   No current facility-administered medications on file prior to visit.   Review of Systems  Constitutional: Negative for unexpected weight change, or unusual diaphoresis  HENT: Negative for tinnitus.   Eyes: Negative for photophobia and visual disturbance.  Respiratory: Negative for choking and stridor.   Gastrointestinal: Negative for vomiting and blood in stool.  Genitourinary: Negative for hematuria and decreased urine volume.  Musculoskeletal: Negative for acute joint swelling Skin: Negative for color change and wound.  Neurological: Negative for tremors and numbness other than noted   Psychiatric/Behavioral: Negative for decreased concentration or  hyperactivity.       Objective:   Physical Exam BP 120/60  Pulse 62  Temp(Src) 98.2 F (36.8 C) (Oral)  Ht 5\' 11"  (1.803 m)  Wt 234 lb 8 oz (106.369 kg)  BMI 32.72 kg/m2  SpO2 94% VS noted,  Constitutional: Pt appears well-developed and well-nourished.  HENT: Head: NCAT.  Right Ear: External ear normal.  Left Ear: External ear normal.  Bilat tm's with mild erythema.  Max sinus areas non tender.  Pharynx with mild erythema, no exudate Eyes: Conjunctivae and  EOM are normal. Pupils are equal, round, and reactive to light.  Neck: Normal range of motion. Neck supple.  Cardiovascular: Normal rate and regular rhythm.   Pulmonary/Chest: Effort normal and breath sounds normal.  Abd:  Soft, NT, non-distended, + BS Neurological: Pt is alert. Not confused , motor/gait intact Skin: Skin is warm. No erythema.  Skin tag noted mid medial left arm approx 10 mm Psychiatric: Pt behavior is normal. Thought content normal.     Assessment & Plan:

## 2012-05-14 NOTE — Patient Instructions (Addendum)
You had the steroid shot today Please take all new medication as prescribed - the flonase Please continue all other medications as before Please keep your appointments with your specialists as you have planned - the [facility] Urologist as you have planned Please go to the LAB in the Basement (turn left off the elevator) for the tests to be done today You will be contacted by phone if any changes need to be made immediately.  Otherwise, you will receive a letter about your results with an explanation Please remember to sign up for My Chart if you have not done so, as this will be important to you in the future with finding out test results, communicating by private email, and scheduling acute appointments online when needed.

## 2012-05-14 NOTE — Telephone Encounter (Signed)
Done erx 

## 2012-05-15 ENCOUNTER — Telehealth: Payer: Self-pay

## 2012-05-15 MED ORDER — SITAGLIPTIN PHOSPHATE 50 MG PO TABS: 50.0000 mg | ORAL_TABLET | Freq: Every day | ORAL | Status: DC

## 2012-05-15 NOTE — Telephone Encounter (Signed)
Please change the Actos due to the cost $300.Marland KitchenMarland KitchenMarland Kitchen

## 2012-05-15 NOTE — Telephone Encounter (Signed)
Ok to try change to [place] 50 mg - done erx

## 2012-05-16 ENCOUNTER — Telehealth: Payer: Self-pay | Admitting: Internal Medicine

## 2012-05-16 MED ORDER — SITAGLIPTIN PHOSPHATE 50 MG PO TABS: 50.0000 mg | ORAL_TABLET | Freq: Every day | ORAL | Status: DC

## 2012-05-16 NOTE — Telephone Encounter (Signed)
Please see fax on your desk from pharmacy on cost of Januvia

## 2012-05-16 NOTE — Telephone Encounter (Signed)
Received information from pharmacy, with [place] over $800 per mo without insurance  OK for Eber Jones to assist pt with filling out the Pt Assistance application please  (I sent form to her)

## 2012-05-16 NOTE — Telephone Encounter (Signed)
Called the patient informed MD did receive note from pharmacy on Januvia cost.  Informed Dr. Jonny Ruiz is recommending a pateint assitance form be completed for this patient.  Informed he should receive a phone call once complete and most likely he would need to supply financial information to form as well.

## 2012-05-17 NOTE — Assessment & Plan Note (Signed)
For f/u urology as planned Lab Results  Component Value Date   PSA 3.23 09/27/2011   PSA 1.41 03/11/2010   PSA 2.99 03/09/2009

## 2012-05-17 NOTE — Assessment & Plan Note (Signed)
stable overall by history and exam, recent data reviewed with pt, and pt to continue medical treatment as before,  to f/u any worsening symptoms or concerns Lab Results  Component Value Date   HGBA1C 8.1* 05/14/2012

## 2012-05-17 NOTE — Assessment & Plan Note (Signed)
stable overall by history and exam, recent data reviewed with pt, and pt to continue medical treatment as before,  to f/u any worsening symptoms or concerns BP Readings from Last 3 Encounters:  05/14/12 120/60  05/03/12 112/64  12/14/11 128/78

## 2012-05-17 NOTE — Assessment & Plan Note (Signed)
Mild to mod flare, for depomedrol IM, and flonase asd,  to f/u any worsening symptoms or concerns

## 2012-05-20 NOTE — Telephone Encounter (Signed)
Called RN with [facility] to inform status of patients patient assistance.  Stated they would followup with the patient to complete his part of form.

## 2012-05-20 NOTE — Telephone Encounter (Signed)
Patient's financial assistance paperwork ready to be sent in.  Called pt to notify that a signature and financial documents are needed.  Forms are on standby until the patient returns the call and completes the process.

## 2012-06-18 ENCOUNTER — Encounter (INDEPENDENT_AMBULATORY_CARE_PROVIDER_SITE_OTHER): Payer: Medicare Other

## 2012-06-18 DIAGNOSIS — R0989 Other specified symptoms and signs involving the circulatory and respiratory systems: Secondary | ICD-10-CM

## 2012-06-19 ENCOUNTER — Telehealth: Payer: Self-pay | Admitting: *Deleted

## 2012-06-19 MED ORDER — ATORVASTATIN CALCIUM 80 MG PO TABS: 80.0000 mg | ORAL_TABLET | Freq: Every day | ORAL | Status: DC

## 2012-06-19 NOTE — Telephone Encounter (Signed)
Received call from research, the pt is finishing up a study and they wanted to know what cholesterol med dr Jens Som would like the pt to take. Spoke with pt, he will start lipitor 80 mg once daily. He reports he has no money at this time and will be awhile before he can pick it up. Once he starts the lipitor he will call to make an appt for labs in 6 weeks.

## 2012-07-16 ENCOUNTER — Other Ambulatory Visit: Payer: Self-pay | Admitting: Internal Medicine

## 2012-07-16 MED ORDER — GLUCOSE BLOOD VI STRP: ORAL_STRIP | Status: DC

## 2012-07-24 ENCOUNTER — Encounter: Payer: Self-pay | Admitting: Internal Medicine

## 2012-07-25 ENCOUNTER — Telehealth: Payer: Self-pay

## 2012-07-25 MED ORDER — GLUCOSE BLOOD VI STRP: ORAL_STRIP | Status: DC

## 2012-07-25 NOTE — Telephone Encounter (Signed)
Resent test strips to mail order ([facility]) per [facility] instructions due to cost being more affordable for the patient.

## 2012-07-29 ENCOUNTER — Telehealth: Payer: Self-pay

## 2012-07-29 NOTE — Telephone Encounter (Signed)
[facility] informed

## 2012-07-29 NOTE — Telephone Encounter (Signed)
Ok to stop the glimeparide

## 2012-07-29 NOTE — Addendum Note (Signed)
Addended by: Corwin Levins on: 07/29/2012 01:25 PM   Modules accepted: Orders, Medications

## 2012-07-29 NOTE — Telephone Encounter (Signed)
HHRN called to inform the patient is having some hypoglycemic, 68-72.  States he gets up every morning around 6 am checks BS (ususally in the low 100's).  He then eats breakfast and anywhere 11 am to 3 pm has low's in the 70's.  He then gets light headed and weak.  HHRN stated she has been doing intensive diet education with this patient and says his diet has improved a lot and that he has cut his heavy evening meal out that use to be full of starches.He is taking ALL diabetic medications in the morning and taking as directed.  HHRN would like a call back on what to do.  Please advise

## 2012-08-14 ENCOUNTER — Ambulatory Visit: Payer: Medicare Other | Admitting: Internal Medicine

## 2012-08-15 ENCOUNTER — Other Ambulatory Visit (INDEPENDENT_AMBULATORY_CARE_PROVIDER_SITE_OTHER): Payer: Medicare Other

## 2012-08-15 ENCOUNTER — Ambulatory Visit (INDEPENDENT_AMBULATORY_CARE_PROVIDER_SITE_OTHER)
Admission: RE | Admit: 2012-08-15 | Discharge: 2012-08-15 | Disposition: A | Discharge status: Home / Self Care | Payer: Medicare Other | Source: Ambulatory Visit | Attending: Internal Medicine | Admitting: Internal Medicine

## 2012-08-15 ENCOUNTER — Encounter: Payer: Self-pay | Admitting: Internal Medicine

## 2012-08-15 ENCOUNTER — Ambulatory Visit (INDEPENDENT_AMBULATORY_CARE_PROVIDER_SITE_OTHER): Payer: Medicare Other | Admitting: Internal Medicine

## 2012-08-15 VITALS — BP 128/70 | HR 64 | Temp 97.8°F | Ht 71.0 in | Wt 228.8 lb

## 2012-08-15 DIAGNOSIS — M25552 Pain in left hip: Secondary | ICD-10-CM | POA: Insufficient documentation

## 2012-08-15 DIAGNOSIS — M5430 Sciatica, unspecified side: Secondary | ICD-10-CM

## 2012-08-15 DIAGNOSIS — M25559 Pain in unspecified hip: Secondary | ICD-10-CM

## 2012-08-15 DIAGNOSIS — M5432 Sciatica, left side: Secondary | ICD-10-CM

## 2012-08-15 DIAGNOSIS — E119 Type 2 diabetes mellitus without complications: Secondary | ICD-10-CM

## 2012-08-15 LAB — BASIC METABOLIC PANEL
CO2: 25 mEq/L (ref 19–32)
Calcium: 9.1 mg/dL (ref 8.4–10.5)
Creatinine, Ser: 0.8 mg/dL (ref 0.4–1.5)
Glucose, Bld: 182 mg/dL — ABNORMAL HIGH (ref 70–99)

## 2012-08-15 LAB — LIPID PANEL
HDL: 37.2 mg/dL — ABNORMAL LOW (ref >39.00)
VLDL: 12.4 mg/dL (ref 0.0–40.0)

## 2012-08-15 LAB — HEMOGLOBIN A1C: Hgb A1c MFr Bld: 7.7 % — ABNORMAL HIGH (ref 4.6–6.5)

## 2012-08-15 LAB — HEPATIC FUNCTION PANEL
Albumin: 3.9 g/dL (ref 3.5–5.2)
Total Protein: 6.9 g/dL (ref 6.0–8.3)

## 2012-08-15 MED ORDER — METHYLPREDNISOLONE ACETATE 80 MG/ML IJ SUSP
120.0000 mg | Freq: Once | INTRAMUSCULAR | Status: AC
  Administered 2012-08-15: 120 mg via INTRAMUSCULAR

## 2012-08-15 NOTE — Patient Instructions (Addendum)
You had the steroid shot today Please continue all other medications as before, and refills have been done if requested. Please have the pharmacy call with any other refills you may need. Please keep your appointments with your specialists as you have planned You will be contacted regarding the referral for: orthopedic  Please go to the XRAY Department in the Basement (go straight as you get off the elevator) for the x-ray testing Please go to the LAB in the Basement (turn left off the elevator) for the tests to be done today You will be contacted by phone if any changes need to be made immediately.  Otherwise, you will receive a letter about your results with an explanation, but please check with MyChart first.  Please return in 6 months, or sooner if needed  OK to cancel the oct 15 appt with me

## 2012-08-15 NOTE — Assessment & Plan Note (Signed)
Etiology unclear, no known prostate ca mets, for films today, refer ortho, declines further meds

## 2012-08-15 NOTE — Assessment & Plan Note (Signed)
stable overall by history and exam, recent data reviewed with pt, and pt to continue medical treatment as before,  to f/u any worsening symptoms or concerns Lab Results  Component Value Date   HGBA1C 8.1* 05/14/2012

## 2012-08-15 NOTE — Progress Notes (Addendum)
Subjective:    Patient ID: [name], male    DOB: May 23, 1945, 67 y.o.   MRN: [id number]  HPI  Here to f/u; overall doing ok,  Pt denies chest pain, increased sob or doe, wheezing, orthopnea, PND, increased LE swelling, palpitations, dizziness or syncope.  Pt denies polydipsia, polyuria, or low sugar symptoms such as weakness or confusion improved with po intake.  Pt denies new neurological symptoms such as new headache, or facial or extremity weakness or numbness.   Pt states overall good compliance with meds, has been trying to follow lower cholesterol, diabetic diet, with wt overall stable,  but little exercise however. Has been taking the actos now for 2 mo after some initial problem with the med with pt assistance, was initially over $1K for 3 mo with drug plan.  Also with worsening 2-3 mo (or longer) grandually worsening left :LBP, has hx of lubmar djd/dd after MRI 2009,now constant, mild to occasionally severe, with radiation to the distal LLE but not numbness or weakness; does worry about falls as has fallen 3 times in past 6 mo with episodes LLE weakness only.  Pt no bowel or bladder change, fever, wt loss.  Asks for depomedrl as this helped a few yrs ago. Past Medical History  Diagnosis Date  . 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 DIABETES MELLITUS, TYPE II 08/29/2006  . HYPERLIPIDEMIA 08/29/2006  . GOUT 04/22/2007  . Overweight(278.02) 08/29/2006  . ERECTILE DYSFUNCTION 11/26/2006  . DEPRESSION 08/29/2006  . HYPERTENSION 08/29/2006  . CORONARY ARTERY DISEASE 08/29/2006  . 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 DIASTOLIC HEART FAILURE, CHRONIC 06/09/2008  . BENIGN PROSTATIC HYPERTROPHY 08/29/2006  . LUMBAR RADICULOPATHY, RIGHT 06/10/2007  . INSOMNIA-SLEEP DISORDER-UNSPEC 10/23/2007  . PACEMAKER, PERMANENT 03/04/2008    pt denies this date  . Left lumbar radiculopathy 05/30/2010  . Myocardial infarction 12/27/10    "I've had several  MIs"  . Sleep apnea     "if I lay flat I quit breathing; HOB up I'm fine"  . Arthritis     "lower back; going back down both my sciatic nerves"  . Syncope and collapse 12/27/10  . Aortic root dilatation 02/02/2011   Past Surgical History  Procedure Laterality Date  . S/p left arm surgury after work accident  1991    "2000# steel fell on it"  . S/p right hand surgury for foreign object  1970's    "piece of wood went in my hand; had to get that out"  . Insert / replace / remove pacemaker  ~ 2004    initial pacemaker placement  . Insert / replace / remove pacemaker  10/2009    generator change  . Coronary artery bypass graft  1992    CABG X 2  . Coronary angioplasty with stent placement  12/27/10    "I've had a total of 9 cardiac stents put in"    reports that he quit smoking about 41 years ago. His smoking use included Cigarettes. He has a 27 pack-year smoking history. He has quit using smokeless tobacco. He reports that he does not drink alcohol or use illicit drugs. family history includes Coronary artery disease (age of onset: 67) in his other; Diabetes in his mother, other, and sister; and Heart disease in his sisters. Allergies  Allergen Reactions  . Crestor (Rosuvastatin Calcium) Other (See Comments)    Common to girls period.   Current Outpatient Prescriptions on File Prior to Visit  Medication Sig Dispense Refill  . allopurinol (ZYLOPRIM) 300 MG tablet take 1 tablet by mouth once daily  30 tablet  11  . aspirin EC 81 MG tablet Take 1 tablet (81 mg total) by mouth daily.      Marland Kitchen atorvastatin (LIPITOR) 80 MG tablet Take 1 tablet (80 mg total) by mouth daily.  30 tablet  12  . carvedilol (COREG) 25 MG tablet Take one half tablet twice daily  60 tablet  12  . cetirizine (ZYRTEC) 10 MG tablet Take 1 tablet (10 mg total) by mouth daily.  30 tablet  11  . cyclobenzaprine (FLEXERIL) 5 MG tablet Take 1 tablet (5 mg total) by mouth 3 (three) times daily as needed. For muscle spasms  90  tablet  11  . docusate sodium (COLACE) 100 MG capsule Take 100 mg by mouth 2 (two) times daily as needed.      . fluticasone (FLONASE) 50 MCG/ACT nasal spray Place 2 sprays into the nose daily.  16 g  2  . furosemide (LASIX) 40 MG tablet Take 1 tablet (40 mg total) by mouth daily.  30 tablet  11  . gabapentin (NEURONTIN) 300 MG capsule Take 1 capsule (300 mg total) by mouth 3 (three) times daily.  270 capsule  1  . glucose blood (ONE TOUCH ULTRA TEST) test strip Use as instructed three times per day  300 each  12  . HYDROcodone-acetaminophen (NORCO) 10-325 MG per tablet Take 1 tablet by mouth every 8 (eight) hours as needed for pain.  90 tablet  3  . ibuprofen (ADVIL,MOTRIN) 200 MG tablet Take 800 mg by mouth every 6 (six) hours as needed.      Marland Kitchen lisinopril (PRINIVIL,ZESTRIL) 2.5 MG tablet Take 1 tablet (2.5 mg total) by mouth daily.  30 tablet  12  . lisinopril (PRINIVIL,ZESTRIL) 2.5 MG tablet Take 1 tablet (2.5 mg total) by mouth daily.  30 tablet  11  . metFORMIN (GLUCOPHAGE) 500 MG tablet TAKE 4 TABLETS ONCE DAILY  120 tablet  11  . Multiple Vitamin (MULTIVITAMIN) capsule Take 1 capsule by mouth daily.      . potassium chloride SA (K-DUR,KLOR-CON) 20 MEQ tablet Take 1 tablet (20 mEq total) by mouth daily.  30 tablet  11  . sitaGLIPtin (JANUVIA) 50 MG tablet Take 1 tablet (50 mg total) by mouth daily.  90 tablet  3  . triamcinolone (KENALOG) 0.5 % cream Apply 1 application topically 2 (two) times daily as needed. For break out of affected area.       No current facility-administered medications on file prior to visit.   Review of Systems  Constitutional: Negative for unexpected weight change, or unusual diaphoresis  HENT: Negative for tinnitus.   Eyes: Negative for photophobia and visual disturbance.  Respiratory: Negative for choking and stridor.   Gastrointestinal: Negative for vomiting and blood in stool.  Genitourinary: Negative for hematuria and decreased urine volume.   Musculoskeletal: Negative for acute joint swelling Skin: Negative for color change and wound.  Neurological: Negative for tremors and numbness other than noted  Psychiatric/Behavioral: Negative for decreased concentration or  hyperactivity.       Objective:   Physical Exam BP 128/70  Pulse 64  Temp(Src) 97.8 F (36.6 C) (Oral)  Ht 5\' 11"  (1.803 m)  Wt 228 lb 12 oz (103.76 kg)  BMI 31.92 kg/m2  SpO2 96% VS noted, not ill appearing Constitutional: Pt appears well-developed and well-nourished.  HENT: Head: NCAT.  Right Ear: External  ear normal.  Left Ear: External ear normal.  Eyes: Conjunctivae and EOM are normal. Pupils are equal, round, and reactive to light.  Neck: Normal range of motion. Neck supple.  Cardiovascular: Normal rate and regular rhythm.   Pulmonary/Chest: Effort normal and breath sounds normal.  Abd:  Soft, NT, non-distended, + BS Spine diffuse tender lumbar spine and left paravertebral mild Right hip - slight decr ROM but on pain on int/ext rotation Left hip - marked decr ROM to flexion, and severe pain to minor passive int/ext rotation effort Neurological: Pt is alert. Not confused , motor 4/5 LLE but liimited exam due to pain, dtr/sens intact, gait antalgic favoring LLE Skin: Skin is warm. No erythema.  Psychiatric: Pt behavior is normal. Thought content normal.     Assessment & Plan:  Quality Measures addressed:   Colorectal Cancer screening: pt declines all at this time, including FOBT, flex sig, colonoscopy  Diabetes Hgba1c < 8%: pt declines further medication

## 2012-08-15 NOTE — Assessment & Plan Note (Signed)
Ok for depomedrol IM today per pt request, to watch sugars closely, at least mild but suspect most pain may be from his left hip issue

## 2012-08-20 ENCOUNTER — Ambulatory Visit: Payer: Medicare Other | Admitting: Cardiology

## 2012-08-21 ENCOUNTER — Encounter: Payer: Self-pay | Admitting: Cardiology

## 2012-08-21 ENCOUNTER — Ambulatory Visit (INDEPENDENT_AMBULATORY_CARE_PROVIDER_SITE_OTHER): Payer: Medicare Other | Admitting: Cardiology

## 2012-08-21 VITALS — BP 122/80 | HR 65 | Wt 228.0 lb

## 2012-08-21 DIAGNOSIS — I2510 Atherosclerotic heart disease of native coronary artery without angina pectoris: Secondary | ICD-10-CM

## 2012-08-21 DIAGNOSIS — Z950 Presence of cardiac pacemaker: Secondary | ICD-10-CM

## 2012-08-21 DIAGNOSIS — R0989 Other specified symptoms and signs involving the circulatory and respiratory systems: Secondary | ICD-10-CM | POA: Insufficient documentation

## 2012-08-21 DIAGNOSIS — E785 Hyperlipidemia, unspecified: Secondary | ICD-10-CM

## 2012-08-21 DIAGNOSIS — I5032 Chronic diastolic (congestive) heart failure: Secondary | ICD-10-CM

## 2012-08-21 DIAGNOSIS — I25810 Atherosclerosis of coronary artery bypass graft(s) without angina pectoris: Secondary | ICD-10-CM

## 2012-08-21 DIAGNOSIS — I10 Essential (primary) hypertension: Secondary | ICD-10-CM

## 2012-08-21 NOTE — Assessment & Plan Note (Signed)
Management per electrophysiology. 

## 2012-08-21 NOTE — Assessment & Plan Note (Signed)
Blood pressure controlled. Continue present medications. 

## 2012-08-21 NOTE — Assessment & Plan Note (Signed)
Check abdominal ultrasound to exclude aneurysm.

## 2012-08-21 NOTE — Progress Notes (Signed)
HPI: Pleasant male with past medical history of coronary artery disease status post coronary artery bypass graft, previous pacemaker placement for followup. Last cardiac catheterization in May of 2010 showed a normal left main, a 50-70% circumflex, a totally occluded LAD, and a focal 70% right coronary artery. The saphenous vein graft to the circumflex was occluded. The LIMA to the LAD was patent. Ejection fraction was 60%. Myoview in July of 2012 showed an ejection fraction of 51%, apical scar and mild ischemia; we are treating medically. Carotid Dopplers in October of 2011 were normal. Had syncopal episode in Nov 2011. Admitted and troponins normal. Pacemaker interrogation revealed no arrhythmias. Repeat echocardiogram in December of 2012 showed an ejection fraction of 40-45%, mild biatrial enlargement, mild aortic root dilatation and reduced RV function. Since I last saw him in July 2013, the patient has dyspnea with more extreme activities but not with routine activities. It is relieved with rest. It is not associated with chest pain. There is no orthopnea, PND or pedal edema. There is no syncope or palpitations. There is no exertional chest pain.   Current Outpatient Prescriptions  Medication Sig Dispense Refill  . allopurinol (ZYLOPRIM) 300 MG tablet take 1 tablet by mouth once daily  30 tablet  11  . aspirin EC 81 MG tablet Take 1 tablet (81 mg total) by mouth daily.      Marland Kitchen atorvastatin (LIPITOR) 80 MG tablet Take 1 tablet (80 mg total) by mouth daily.  30 tablet  12  . carvedilol (COREG) 25 MG tablet Take one half tablet twice daily  60 tablet  12  . cetirizine (ZYRTEC) 10 MG tablet Take 1 tablet (10 mg total) by mouth daily.  30 tablet  11  . cyclobenzaprine (FLEXERIL) 5 MG tablet Take 1 tablet (5 mg total) by mouth 3 (three) times daily as needed. For muscle spasms  90 tablet  11  . docusate sodium (COLACE) 100 MG capsule Take 100 mg by mouth 2 (two) times daily as needed.      .  fluticasone (FLONASE) 50 MCG/ACT nasal spray Place 2 sprays into the nose daily.  16 g  2  . furosemide (LASIX) 40 MG tablet Take 1 tablet (40 mg total) by mouth daily.  30 tablet  11  . gabapentin (NEURONTIN) 300 MG capsule Take 1 capsule (300 mg total) by mouth 3 (three) times daily.  270 capsule  1  . glucose blood (ONE TOUCH ULTRA TEST) test strip Use as instructed three times per day  300 each  12  . HYDROcodone-acetaminophen (NORCO) 10-325 MG per tablet Take 1 tablet by mouth every 8 (eight) hours as needed for pain.  90 tablet  3  . ibuprofen (ADVIL,MOTRIN) 200 MG tablet Take 800 mg by mouth every 6 (six) hours as needed.      Marland Kitchen lisinopril (PRINIVIL,ZESTRIL) 2.5 MG tablet Take 1 tablet (2.5 mg total) by mouth daily.  30 tablet  12  . metFORMIN (GLUCOPHAGE) 500 MG tablet TAKE 4 TABLETS ONCE DAILY  120 tablet  11  . Multiple Vitamin (MULTIVITAMIN) capsule Take 1 capsule by mouth daily.      . potassium chloride SA (K-DUR,KLOR-CON) 20 MEQ tablet Take 1 tablet (20 mEq total) by mouth daily.  30 tablet  11  . sitaGLIPtin (JANUVIA) 50 MG tablet Take 1 tablet (50 mg total) by mouth daily.  90 tablet  3   No current facility-administered medications for this visit.     Past Medical History  Diagnosis Date  . 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 DIABETES MELLITUS, TYPE II 08/29/2006  . HYPERLIPIDEMIA 08/29/2006  . GOUT 04/22/2007  . Overweight(278.02) 08/29/2006  . ERECTILE DYSFUNCTION 11/26/2006  . DEPRESSION 08/29/2006  . HYPERTENSION 08/29/2006  . CORONARY ARTERY DISEASE 08/29/2006  . 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 DIASTOLIC HEART FAILURE, CHRONIC 06/09/2008  . BENIGN PROSTATIC HYPERTROPHY 08/29/2006  . LUMBAR RADICULOPATHY, RIGHT 06/10/2007  . INSOMNIA-SLEEP DISORDER-UNSPEC 10/23/2007  . PACEMAKER, PERMANENT 03/04/2008    pt denies this date  . Left lumbar radiculopathy 05/30/2010  . Myocardial infarction 12/27/10    "I've had  several MIs"  . Sleep apnea     "if I lay flat I quit breathing; HOB up I'm fine"  . Arthritis     "lower back; going back down both my sciatic nerves"  . Syncope and collapse 12/27/10  . Aortic root dilatation 02/02/2011    Past Surgical History  Procedure Laterality Date  . S/p left arm surgury after work accident  1991    "2000# steel fell on it"  . S/p right hand surgury for foreign object  1970's    "piece of wood went in my hand; had to get that out"  . Insert / replace / remove pacemaker  ~ 2004    initial pacemaker placement  . Insert / replace / remove pacemaker  10/2009    generator change  . Coronary artery bypass graft  1992    CABG X 2  . Coronary angioplasty with stent placement  12/27/10    "I've had a total of 9 cardiac stents put in"    History   Social History  . Marital Status: Married    Spouse Name: N/A    Number of Children: 2  . Years of Education: N/A   Occupational History  . prior work Games developer   . disabled since 2004    Social History Main Topics  . Smoking status: Former Smoker -- 3.00 packs/day for 9 years    Types: Cigarettes    Quit date: 12/09/1970  . Smokeless tobacco: Former Neurosurgeon  . Alcohol Use: No  . Drug Use: No     Comment: "used pouches of tobacco for a long time; quit those 12/09/1970"  . Sexually Active: Yes   Other Topics Concern  . Not on file   Social History Narrative  . No narrative on file    ROS: left hip pain but no fevers or chills, productive cough, hemoptysis, dysphasia, odynophagia, melena, hematochezia, dysuria, hematuria, rash, seizure activity, orthopnea, PND, pedal edema, claudication. Remaining systems are negative.  Physical Exam: Well-developed obese in no acute distress.  Skin is warm and dry.  HEENT is normal.  Neck is supple.  Chest is clear to auscultation with normal expansion.  Cardiovascular exam is regular rate and rhythm.  Abdominal exam nontender or distended. No masses palpated.  Bruit noted. Extremities show no edema. neuro grossly intact  ECG sinus rhythm with ventricular pacing.

## 2012-08-21 NOTE — Assessment & Plan Note (Signed)
Continue present dose of Lasix. 

## 2012-08-21 NOTE — Assessment & Plan Note (Signed)
Continue aspirin and statin. 

## 2012-08-21 NOTE — Assessment & Plan Note (Signed)
Continue statin. 

## 2012-08-21 NOTE — Patient Instructions (Addendum)
Your physician wants you to follow-up in: ONE YEAR WITH DR CRENSHAW You will receive a reminder letter in the mail two months in advance. If you don't receive a letter, please call our office to schedule the follow-up appointment.   Your physician has requested that you have an abdominal aorta duplex. During this test, an ultrasound is used to evaluate the aorta. Allow 30 minutes for this exam. Do not eat after midnight the day before and avoid carbonated beverages  

## 2012-08-26 ENCOUNTER — Encounter (INDEPENDENT_AMBULATORY_CARE_PROVIDER_SITE_OTHER): Payer: Medicare Other

## 2012-08-26 DIAGNOSIS — I2510 Atherosclerotic heart disease of native coronary artery without angina pectoris: Secondary | ICD-10-CM

## 2012-08-26 DIAGNOSIS — R0989 Other specified symptoms and signs involving the circulatory and respiratory systems: Secondary | ICD-10-CM

## 2012-08-28 ENCOUNTER — Telehealth: Payer: Self-pay | Admitting: Cardiology

## 2012-08-28 NOTE — Telephone Encounter (Signed)
New problem   Per pt returning your call regarding test results-he will be gone until around 12p today-please call after that

## 2012-08-28 NOTE — Telephone Encounter (Signed)
Spoke with pt, aware of doppler results 

## 2012-09-24 ENCOUNTER — Other Ambulatory Visit: Payer: Self-pay | Admitting: Internal Medicine

## 2012-09-24 MED ORDER — SITAGLIPTIN PHOSPHATE 50 MG PO TABS: 50.0000 mg | ORAL_TABLET | Freq: Every day | ORAL | Status: DC

## 2012-10-08 ENCOUNTER — Ambulatory Visit (INDEPENDENT_AMBULATORY_CARE_PROVIDER_SITE_OTHER): Payer: Medicare Other | Admitting: Internal Medicine

## 2012-10-08 ENCOUNTER — Encounter: Payer: Self-pay | Admitting: Internal Medicine

## 2012-10-08 VITALS — BP 127/76 | HR 76 | Ht 71.0 in | Wt 231.0 lb

## 2012-10-08 DIAGNOSIS — R55 Syncope and collapse: Secondary | ICD-10-CM

## 2012-10-08 DIAGNOSIS — I442 Atrioventricular block, complete: Secondary | ICD-10-CM

## 2012-10-08 DIAGNOSIS — Z950 Presence of cardiac pacemaker: Secondary | ICD-10-CM

## 2012-10-08 DIAGNOSIS — I10 Essential (primary) hypertension: Secondary | ICD-10-CM

## 2012-10-08 LAB — PACEMAKER DEVICE OBSERVATION
AL AMPLITUDE: 4 mv
AL THRESHOLD: 0.75 V
BAMS-0001: 175 {beats}/min
BATTERY VOLTAGE: 2.79 V

## 2012-10-08 NOTE — Patient Instructions (Addendum)
Your physician wants you to follow-up in: 6 months in device clinic and 12 months with Dr Taylor You will receive a reminder letter in the mail two months in advance. If you don't receive a letter, please call our office to schedule the follow-up appointment.  

## 2012-10-09 ENCOUNTER — Encounter: Payer: Self-pay | Admitting: Internal Medicine

## 2012-10-09 NOTE — Assessment & Plan Note (Signed)
His Medtronic dual-chamber pacemaker is working normally. He'll continue his current followup schedule.

## 2012-10-09 NOTE — Assessment & Plan Note (Signed)
His blood pressure is well controlled. He'll continue his current medical therapy, maintain a low-sodium diet, and I've encouraged the patient to increase his physical activity.

## 2012-10-09 NOTE — Assessment & Plan Note (Signed)
He has had no recurrent episodes after pacemaker insertion.

## 2012-10-09 NOTE — Progress Notes (Signed)
HPI [name] returns today for followup. He has a history of complete heart block, status post permanent pacemaker insertion, hypertension, and dyslipidemia, and coronary artery disease, status post multiple stents and status post bypass surgery. The patient has a history of dyspnea. He has been followed by Dr. Jens Collier. He denies chest pain or peripheral edema. Allergies  Allergen Reactions  . Crestor [Rosuvastatin Calcium] Other (See Comments)    Common to girls period.     Current Outpatient Prescriptions  Medication Sig Dispense Refill  . allopurinol (ZYLOPRIM) 300 MG tablet take 1 tablet by mouth once daily  30 tablet  11  . aspirin EC 81 MG tablet Take 1 tablet (81 mg total) by mouth daily.      Marland Kitchen atorvastatin (LIPITOR) 80 MG tablet Take 1 tablet (80 mg total) by mouth daily.  30 tablet  12  . carvedilol (COREG) 25 MG tablet Take one half tablet twice daily  60 tablet  12  . cetirizine (ZYRTEC) 10 MG tablet Take 1 tablet (10 mg total) by mouth daily.  30 tablet  11  . cyclobenzaprine (FLEXERIL) 5 MG tablet Take 1 tablet (5 mg total) by mouth 3 (three) times daily as needed. For muscle spasms  90 tablet  11  . docusate sodium (COLACE) 100 MG capsule Take 100 mg by mouth 2 (two) times daily as needed.      . fluticasone (FLONASE) 50 MCG/ACT nasal spray Place 2 sprays into the nose daily.  16 g  2  . furosemide (LASIX) 40 MG tablet Take 1 tablet (40 mg total) by mouth daily.  30 tablet  11  . gabapentin (NEURONTIN) 300 MG capsule Take 1 capsule (300 mg total) by mouth 3 (three) times daily.  270 capsule  1  . glucose blood (ONE TOUCH ULTRA TEST) test strip Use as instructed three times per day  300 each  12  . HYDROcodone-acetaminophen (NORCO) 10-325 MG per tablet Take 1 tablet by mouth every 8 (eight) hours as needed for pain.  90 tablet  3  . ibuprofen (ADVIL,MOTRIN) 200 MG tablet Take 800 mg by mouth every 6 (six) hours as needed.      Marland Kitchen lisinopril (PRINIVIL,ZESTRIL) 2.5 MG tablet  Take 1 tablet (2.5 mg total) by mouth daily.  30 tablet  12  . metFORMIN (GLUCOPHAGE) 500 MG tablet TAKE 4 TABLETS ONCE DAILY  120 tablet  11  . Multiple Vitamin (MULTIVITAMIN) capsule Take 1 capsule by mouth daily.      . potassium chloride SA (K-DUR,KLOR-CON) 20 MEQ tablet Take 1 tablet (20 mEq total) by mouth daily.  30 tablet  11  . sitaGLIPtin (JANUVIA) 50 MG tablet Take 1 tablet (50 mg total) by mouth daily.  90 tablet  3   No current facility-administered medications for this visit.     Past Medical History  Diagnosis Date  . 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 DIABETES MELLITUS, TYPE II 08/29/2006  . HYPERLIPIDEMIA 08/29/2006  . GOUT 04/22/2007  . Overweight(278.02) 08/29/2006  . ERECTILE DYSFUNCTION 11/26/2006  . DEPRESSION 08/29/2006  . HYPERTENSION 08/29/2006  . CORONARY ARTERY DISEASE 08/29/2006  . 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 DIASTOLIC HEART FAILURE, CHRONIC 06/09/2008  . BENIGN PROSTATIC HYPERTROPHY 08/29/2006  . LUMBAR RADICULOPATHY, RIGHT 06/10/2007  . INSOMNIA-SLEEP DISORDER-UNSPEC 10/23/2007  . PACEMAKER, PERMANENT 03/04/2008    pt denies this date  . Left lumbar radiculopathy 05/30/2010  . Myocardial infarction 12/27/10    "I've  had several MIs"  . Sleep apnea     "if I lay flat I quit breathing; HOB up I'm fine"  . Arthritis     "lower back; going back down both my sciatic nerves"  . Syncope and collapse 12/27/10  . Aortic root dilatation 02/02/2011    ROS:   All systems reviewed and negative except as noted in the HPI.   Past Surgical History  Procedure Laterality Date  . S/p left arm surgury after work accident  1991    "2000# steel fell on it"  . S/p right hand surgury for foreign object  1970's    "piece of wood went in my hand; had to get that out"  . Insert / replace / remove pacemaker  ~ 2004    initial pacemaker placement  . Insert / replace / remove pacemaker  10/2009    generator  change  . Coronary artery bypass graft  1992    CABG X 2  . Coronary angioplasty with stent placement  12/27/10    "I've had a total of 9 cardiac stents put in"     Family History  Problem Relation Age of Onset  . Diabetes Mother   . Diabetes Sister   . Heart disease Sister     2 sister died with heart disease  . Coronary artery disease Other 65    male, first degree relative  . Diabetes Other     1st degree relative  . Heart disease Sister      History   Social History  . Marital Status: Married    Spouse Name: N/A    Number of Children: 2  . Years of Education: N/A   Occupational History  . prior work Games developer   . disabled since 2004    Social History Main Topics  . Smoking status: Former Smoker -- 3.00 packs/day for 9 years    Types: Cigarettes    Quit date: 12/09/1970  . Smokeless tobacco: Former Neurosurgeon  . Alcohol Use: No  . Drug Use: No     Comment: "used pouches of tobacco for a long time; quit those 12/09/1970"  . Sexual Activity: Yes   Other Topics Concern  . Not on file   Social History Narrative  . No narrative on file     BP 127/76  Pulse 76  Ht 5\' 11"  (1.803 m)  Wt 231 lb (104.781 kg)  BMI 32.23 kg/m2  Physical Exam:  Well appearing NAD HEENT: Unremarkable Neck:  No JVD, no thyromegally Back:  No CVA tenderness Lungs:  Clear with no wheezes, rales, or rhonchi. HEART:  Regular rate rhythm, no murmurs, no rubs, no clicks Abd:  soft, positive bowel sounds, no organomegally, no rebound, no guarding Ext:  2 plus pulses, no edema, no cyanosis, no clubbing Skin:  No rashes no nodules Neuro:  CN II through XII intact, motor grossly intact   DEVICE  Normal device function.  See PaceArt for details.   Assess/Plan:

## 2012-10-11 ENCOUNTER — Encounter: Payer: Medicare Other | Admitting: Internal Medicine

## 2012-10-17 ENCOUNTER — Ambulatory Visit (INDEPENDENT_AMBULATORY_CARE_PROVIDER_SITE_OTHER): Payer: Medicare Other

## 2012-10-17 DIAGNOSIS — Z23 Encounter for immunization: Secondary | ICD-10-CM

## 2012-11-05 ENCOUNTER — Encounter: Payer: Self-pay | Admitting: Cardiology

## 2012-11-13 ENCOUNTER — Ambulatory Visit: Payer: Medicare Other | Admitting: Internal Medicine

## 2012-12-31 ENCOUNTER — Other Ambulatory Visit: Payer: Self-pay

## 2012-12-31 MED ORDER — HYDROCODONE-ACETAMINOPHEN 10-325 MG PO TABS: 1.0000 | ORAL_TABLET | Freq: Three times a day (TID) | ORAL | Status: DC | PRN

## 2012-12-31 NOTE — Telephone Encounter (Signed)
Done hardcopy to robin, to also let pt know  You are given the letter today explaining the transitional pain medication refill policy due to change in recent [place] Law, and [place] [facility] regulations  Please be aware that I will no longer be able to offer monthly refills of any Schedule II or higher medication starting Mar 02, 2013

## 2012-12-31 NOTE — Telephone Encounter (Signed)
Called the patient informed hardcopy's are ready for pickup at the front desk.  Also explained enclosed letter regarding pain medication refill policy.  Patient stated he would discuss further at his next OV with PCP.

## 2013-01-31 ENCOUNTER — Encounter: Payer: Self-pay | Admitting: Family Medicine

## 2013-01-31 ENCOUNTER — Ambulatory Visit (INDEPENDENT_AMBULATORY_CARE_PROVIDER_SITE_OTHER): Payer: Medicare HMO | Admitting: Family Medicine

## 2013-01-31 VITALS — BP 138/86 | HR 73 | Wt 227.0 lb

## 2013-01-31 DIAGNOSIS — M79641 Pain in right hand: Secondary | ICD-10-CM

## 2013-01-31 DIAGNOSIS — M79609 Pain in unspecified limb: Secondary | ICD-10-CM

## 2013-01-31 DIAGNOSIS — M7989 Other specified soft tissue disorders: Secondary | ICD-10-CM

## 2013-01-31 MED ORDER — INDOMETHACIN 50 MG PO CAPS: 50.0000 mg | ORAL_CAPSULE | Freq: Two times a day (BID) | ORAL | Status: DC

## 2013-01-31 MED ORDER — DOXYCYCLINE HYCLATE 100 MG PO TABS: 100.0000 mg | ORAL_TABLET | Freq: Two times a day (BID) | ORAL | Status: AC

## 2013-01-31 MED ORDER — KETOROLAC TROMETHAMINE 60 MG/2ML IM SOLN
60.0000 mg | Freq: Once | INTRAMUSCULAR | Status: AC
  Administered 2013-01-31: 60 mg via INTRAMUSCULAR

## 2013-01-31 MED ORDER — METHYLPREDNISOLONE ACETATE 80 MG/ML IJ SUSP
80.0000 mg | Freq: Once | INTRAMUSCULAR | Status: AC
  Administered 2013-01-31: 80 mg via INTRAMUSCULAR

## 2013-01-31 NOTE — Assessment & Plan Note (Signed)
Hand swelling noted in differential includes gout versus infectious etiology. This is most likely gout. Patient was given a shot of Toradol as well as Depo-Medrol today to try to help with the pain as well as the swelling. Patient in addition to this will start indomethacin take twice daily for 5 days. Patient also given a prescription for doxycycline in case this is an infectious etiology and we are going until we can. Patient will followup in 72 hours for reevaluation. Patient does have pain worsens or if he gets fevers and chills he will seek medical attention immediately.

## 2013-01-31 NOTE — Patient Instructions (Addendum)
Very nice to meet you I think your hand is a gout flare.  Indomethacin twice daily for 5 days to help with pain and swelling.  I am also giving you a medicine called doxycycline to take in case it is an infection.  If any fever or chills or worsening pain please see urgent care on [address] this weekend or please come see me in Monday.

## 2013-01-31 NOTE — Progress Notes (Signed)
CC: Right hand swollen  HPI: Patient is a very pleasant 68 year old gentleman with significant comorbidities and past medical history coming in with right hand swelling. Patient does have a history of actually having surgery on his hand for a foreign body removal in the 1970s. Patient states this episode of hand swelling started 2 days ago. Patient states it is extremely painful and does have some redness to it. Patient does not remember any true injury. Patient denies any fevers or chills. Patient states though that this pain is more of a dull throbbing pain it is much different than his  Regular gout flares. Patient though denies any recent injury to the hand. Patient states the pain is getting worse and worse and goes only to his fingertips. Patient has not tried any medication of Percocet at home which did not help. Denies numbness in the fingertips. States the pain is even bad to light touch.  Past medical, surgical, family and social history reviewed. Medications reviewed all in the electronic medical record.   Review of Systems: No headache, visual changes, nausea, vomiting, diarrhea, constipation, dizziness, abdominal pain, skin rash, fevers, chills, night sweats, weight loss, swollen lymph nodes, body aches, joint swelling, muscle aches, chest pain, shortness of breath, mood changes.   Objective:    Blood pressure 138/86, pulse 73, weight 227 lb (102.967 kg), SpO2 98.00%. Temperature 98.58F   General: No apparent distress alert and oriented x3 mood and affect normal, dressed appropriately.  HEENT: Pupils equal, extraocular movements intact Respiratory: Patient's speak in full sentences and does not appear short of breath Cardiovascular: No lower extremity edema, non tender, no erythema Skin: Warm dry intact with no signs of infection or rash on extremities or on axial skeleton. Abdomen: Soft nontender Neuro: Cranial nerves II through XII are intact, neurovascularly intact in all  extremities with 2+ DTRs and 2+ pulses. Lymph: No lymphadenopathy of posterior or anterior cervical chain or axillae bilaterally.  Gait normal with good balance and coordination.  MSK: Non tender with full range of motion and good stability and symmetric strength and tone of shoulders, elbows,  hip, knee and ankles bilaterally.  Wrist: Right I inspection of the right hand shows the patient does have some mild erythema and significant swelling compared to the contralateral side. Patient does have capillary refill of the fingers bilaterally that are symmetric. Patient was even tender to palpation to light sensation. On patient's dorsal hand there is an area that appears to be a cystic formation over the medial knuckle. There is no skin breakdown no. Neurovascularly intact distally. Patient is unable to make a fist secondary to the amount of swelling. Patient's pain is considerable. Contralateral hand unremarkable After verbal consent patient was prepped with alcohol swabs and with a 26-gauge half-inch needle was injected with 2 cc of 1% lidocaine. We'll patient was #15 blade was used and inserted into the cystic formation over the medial knuckle on the dorsal aspect of the hand. Frank blood and serosanguineous fluid came out. Patient did have some mild relief of the pain. This was then wrapped in Coban and post incision and drainage instructions given. Impression and Recommendations:     This case required medical decision making of moderate complexity.

## 2013-02-03 ENCOUNTER — Telehealth: Payer: Self-pay | Admitting: Family Medicine

## 2013-02-03 ENCOUNTER — Ambulatory Visit (INDEPENDENT_AMBULATORY_CARE_PROVIDER_SITE_OTHER): Payer: Commercial Managed Care - HMO | Admitting: Family Medicine

## 2013-02-03 ENCOUNTER — Encounter: Payer: Self-pay | Admitting: Family Medicine

## 2013-02-03 VITALS — BP 136/70 | HR 78

## 2013-02-03 DIAGNOSIS — M7989 Other specified soft tissue disorders: Secondary | ICD-10-CM

## 2013-02-03 MED ORDER — AMBULATORY NON FORMULARY MEDICATION: Status: DC

## 2013-02-03 NOTE — Progress Notes (Signed)
Pre-visit discussion using our clinic review tool. No additional management support is needed unless otherwise documented below in the visit note.  

## 2013-02-03 NOTE — Assessment & Plan Note (Signed)
Patient's insulin was likely secondary to gout flare. It is continuing to improve but I would like him to do the indomethacin anyhow. Patient does not need to take antibiotics. Patient's did ask about pain medications which I stated to him I would not refill. Patient will need to go to a pain management center for chronic narcotics if he wants to continue this. Patient did bring up the idea back pain. Patient was given a handout at this time to try to do the exercises as well as information for over-the-counter medications are to be beneficial. Patient will come back in 23 weeks continues to have back pain and we will address it more thoroughly.

## 2013-02-03 NOTE — Patient Instructions (Signed)
Please do the indomethacin, I think it will help the most I do think this was a gout flare so watch your diet.  Keep hydrated Check on the back brace, we sent it in to the mail order For you back these medicines can help Glucosamine sulfate 750mg  twice a day is a supplement that has been shown to help moderate to severe arthritis. Vitamin D 2000 IU daily Fish oil 2 grams daily.  Tumeric 500mg  twice daily.  Capsaicin topically up to four times a day may also help with pain. It's important that you continue to stay active. Controlling your weight is important.  Consider physical therapy to strengthen muscles around the joint that hurts to take pressure off of the joint itself. Shoe inserts with good arch support may be helpful.  [employer] orthotics at [employer] [facility] could help.  Water aerobics and cycling with low resistance are the best two types of exercise for arthritis. Come back and see me in 3 weeks if back not much better.

## 2013-02-03 NOTE — Telephone Encounter (Signed)
Pt wants to let Dr. Tamala Julian know since the Indomethacin was sent to the mail order he won't be able to start it until it arrives in a day or two.

## 2013-02-03 NOTE — Progress Notes (Signed)
CC: Right hand swollen followup  HPI: Patient is a very pleasant 68 year old gentleman with significant comorbidities and past medical history coming in with right hand swelling. Patient's hand did have an incision and drainage that we had serosanguineous fluid removed. Patient states since that time the pain has gotten significantly better. Patient did not take any medications were prescribed. The pain does he states seems to be getting better. Patient states it is approximately 70% better without doing anything different. Denies any new symptoms.  Past medical, surgical, family and social history reviewed. Medications reviewed all in the electronic medical record.   Review of Systems: No headache, visual changes, nausea, vomiting, diarrhea, constipation, dizziness, abdominal pain, skin rash, fevers, chills, night sweats, weight loss, swollen lymph nodes, body aches, joint swelling, muscle aches, chest pain, shortness of breath, mood changes.   Objective:    Blood pressure 136/70, pulse 78, SpO2 97.00%. Temperature 98.38F   General: No apparent distress alert and oriented x3 mood and affect normal, dressed appropriately.  HEENT: Pupils equal, extraocular movements intact Respiratory: Patient's speak in full sentences and does not appear short of breath Cardiovascular: No lower extremity edema, non tender, no erythema Skin: Warm dry intact with no signs of infection or rash on extremities or on axial skeleton. Abdomen: Soft nontender Neuro: Cranial nerves II through XII are intact, neurovascularly intact in all extremities with 2+ DTRs and 2+ pulses. Lymph: No lymphadenopathy of posterior or anterior cervical chain or axillae bilaterally.  Gait normal with good balance and coordination.  MSK: Non tender with full range of motion and good stability and symmetric strength and tone of shoulders, elbows,  hip, knee and ankles bilaterally.  Wrist: Right Inspection of the right hand shows the  patient does have some significant improvement with no erythema but still some mild swelling at the metacarpal region. Patient's fingers appear to be significantly less swollen. He is neurovascularly intact distally. Good capillary refill. Good hand strength.  Impression and Recommendations:     This case required medical decision making of moderate complexity.

## 2013-02-03 NOTE — Telephone Encounter (Signed)
Discussed with pt

## 2013-02-07 ENCOUNTER — Telehealth: Payer: Self-pay | Admitting: *Deleted

## 2013-02-07 MED ORDER — CARVEDILOL 25 MG PO TABS: ORAL_TABLET | ORAL | Status: DC

## 2013-02-07 MED ORDER — METFORMIN HCL 500 MG PO TABS: ORAL_TABLET | ORAL | Status: DC

## 2013-02-07 MED ORDER — ALLOPURINOL 300 MG PO TABS: ORAL_TABLET | ORAL | Status: DC

## 2013-02-07 MED ORDER — LISINOPRIL 2.5 MG PO TABS: 2.5000 mg | ORAL_TABLET | Freq: Every day | ORAL | Status: DC

## 2013-02-07 MED ORDER — POTASSIUM CHLORIDE CRYS ER 20 MEQ PO TBCR: 20.0000 meq | EXTENDED_RELEASE_TABLET | Freq: Every day | ORAL | Status: DC

## 2013-02-07 MED ORDER — FUROSEMIDE 40 MG PO TABS: 40.0000 mg | ORAL_TABLET | Freq: Every day | ORAL | Status: DC

## 2013-02-07 MED ORDER — SITAGLIPTIN PHOSPHATE 50 MG PO TABS: 50.0000 mg | ORAL_TABLET | Freq: Every day | ORAL | Status: DC

## 2013-02-07 NOTE — Telephone Encounter (Signed)
[employer] [employer] Case manager Bradd Canary (?) phoned stating that patient needs ALL his meds sent to his mail order pharmacy today.  [facility] looks like MD assistant has already submitted refills to pharmacy.  CB# [phone] Arbie Cookey Spinks)

## 2013-02-07 NOTE — Telephone Encounter (Signed)
rx's sent to mail order pharmacy.

## 2013-02-08 ENCOUNTER — Telehealth: Payer: Self-pay | Admitting: *Deleted

## 2013-02-08 NOTE — Telephone Encounter (Signed)
Left message on voicemail to call office regarding Rx's, need to know which mail order pharmacy he needs Rx's sent to?

## 2013-02-10 ENCOUNTER — Other Ambulatory Visit: Payer: Self-pay | Admitting: *Deleted

## 2013-02-10 DIAGNOSIS — M5431 Sciatica, right side: Secondary | ICD-10-CM

## 2013-02-10 DIAGNOSIS — J309 Allergic rhinitis, unspecified: Secondary | ICD-10-CM

## 2013-02-10 MED ORDER — LISINOPRIL 2.5 MG PO TABS
2.5000 mg | ORAL_TABLET | Freq: Every day | ORAL | Status: DC
Start: 2013-02-10 — End: 2013-06-10

## 2013-02-10 MED ORDER — FLUTICASONE PROPIONATE 50 MCG/ACT NA SUSP: 2.0000 | Freq: Every day | NASAL | Status: DC

## 2013-02-10 MED ORDER — SITAGLIPTIN PHOSPHATE 50 MG PO TABS: 50.0000 mg | ORAL_TABLET | Freq: Every day | ORAL | Status: DC

## 2013-02-10 MED ORDER — HYDROCODONE-ACETAMINOPHEN 10-325 MG PO TABS: 1.0000 | ORAL_TABLET | Freq: Three times a day (TID) | ORAL | Status: DC | PRN

## 2013-02-10 MED ORDER — METFORMIN HCL 500 MG PO TABS: ORAL_TABLET | ORAL | Status: DC

## 2013-02-10 MED ORDER — INDOMETHACIN 50 MG PO CAPS: 50.0000 mg | ORAL_CAPSULE | Freq: Two times a day (BID) | ORAL | Status: DC

## 2013-02-10 MED ORDER — GLUCOSE BLOOD VI STRP: ORAL_STRIP | Status: DC

## 2013-02-10 MED ORDER — POTASSIUM CHLORIDE CRYS ER 20 MEQ PO TBCR: 20.0000 meq | EXTENDED_RELEASE_TABLET | Freq: Every day | ORAL | Status: DC

## 2013-02-10 MED ORDER — ATORVASTATIN CALCIUM 80 MG PO TABS: 80.0000 mg | ORAL_TABLET | Freq: Every day | ORAL | Status: DC

## 2013-02-10 MED ORDER — CYCLOBENZAPRINE HCL 5 MG PO TABS: 5.0000 mg | ORAL_TABLET | Freq: Three times a day (TID) | ORAL | Status: DC | PRN

## 2013-02-10 MED ORDER — FUROSEMIDE 40 MG PO TABS: 40.0000 mg | ORAL_TABLET | Freq: Every day | ORAL | Status: DC

## 2013-02-10 MED ORDER — GABAPENTIN 300 MG PO CAPS: 300.0000 mg | ORAL_CAPSULE | Freq: Three times a day (TID) | ORAL | Status: DC

## 2013-02-10 MED ORDER — CARVEDILOL 25 MG PO TABS: ORAL_TABLET | ORAL | Status: DC

## 2013-02-10 MED ORDER — ALLOPURINOL 300 MG PO TABS: ORAL_TABLET | ORAL | Status: DC

## 2013-02-10 NOTE — Telephone Encounter (Signed)
Pt phoned regarding new pharmacy-[facility] with [employer].  States he needs his all of his meds (excluding OTC's) sent to his pharmacy.  Refilled per refill protocol.

## 2013-02-11 MED ORDER — HYDROCODONE-ACETAMINOPHEN 10-325 MG PO TABS
1.0000 | ORAL_TABLET | Freq: Three times a day (TID) | ORAL | Status: DC | PRN
Start: 2013-02-11 — End: 2013-03-25

## 2013-02-11 NOTE — Telephone Encounter (Signed)
Faxed hardcopy of all meds. To [facility].  Advise on hydrocodone as this rx printed on sheet with others, did void as needs to pickup hardcopy.

## 2013-02-11 NOTE — Addendum Note (Signed)
Addended by: Biagio Borg on: 02/11/2013 12:45 PM   Modules accepted: Orders

## 2013-02-11 NOTE — Addendum Note (Signed)
Addended by: Sharon Seller B on: 02/11/2013 11:04 AM   Modules accepted: Orders

## 2013-02-11 NOTE — Telephone Encounter (Signed)
Done hardcopy to [name]  Please remind pt of the letter regarding no further refills of scheduled II or higher controlled substance meds after feb 1; I can try to refer to pain management but there is no guarantee of this

## 2013-02-11 NOTE — Telephone Encounter (Signed)
Called the patient left a detailed message to pickup hardcopy at the front desk.

## 2013-02-18 ENCOUNTER — Encounter: Payer: Self-pay | Admitting: Internal Medicine

## 2013-02-18 ENCOUNTER — Ambulatory Visit (INDEPENDENT_AMBULATORY_CARE_PROVIDER_SITE_OTHER): Payer: Medicare HMO | Admitting: Internal Medicine

## 2013-02-18 ENCOUNTER — Other Ambulatory Visit (INDEPENDENT_AMBULATORY_CARE_PROVIDER_SITE_OTHER): Payer: Medicare HMO

## 2013-02-18 VITALS — BP 138/72 | HR 61 | Temp 97.0°F | Wt 219.0 lb

## 2013-02-18 DIAGNOSIS — E1165 Type 2 diabetes mellitus with hyperglycemia: Principal | ICD-10-CM

## 2013-02-18 DIAGNOSIS — IMO0002 Reserved for concepts with insufficient information to code with codable children: Secondary | ICD-10-CM

## 2013-02-18 DIAGNOSIS — I509 Heart failure, unspecified: Secondary | ICD-10-CM

## 2013-02-18 DIAGNOSIS — Z0000 Encounter for general adult medical examination without abnormal findings: Secondary | ICD-10-CM

## 2013-02-18 DIAGNOSIS — Z950 Presence of cardiac pacemaker: Secondary | ICD-10-CM

## 2013-02-18 DIAGNOSIS — E1149 Type 2 diabetes mellitus with other diabetic neurological complication: Secondary | ICD-10-CM

## 2013-02-18 DIAGNOSIS — E1140 Type 2 diabetes mellitus with diabetic neuropathy, unspecified: Secondary | ICD-10-CM

## 2013-02-18 DIAGNOSIS — G609 Hereditary and idiopathic neuropathy, unspecified: Secondary | ICD-10-CM

## 2013-02-18 DIAGNOSIS — E1142 Type 2 diabetes mellitus with diabetic polyneuropathy: Secondary | ICD-10-CM

## 2013-02-18 DIAGNOSIS — M722 Plantar fascial fibromatosis: Secondary | ICD-10-CM

## 2013-02-18 DIAGNOSIS — G629 Polyneuropathy, unspecified: Secondary | ICD-10-CM

## 2013-02-18 LAB — CBC WITH DIFFERENTIAL/PLATELET
BASOS PCT: 0.3 % (ref 0.0–3.0)
Basophils Absolute: 0 10*3/uL (ref 0.0–0.1)
EOS ABS: 0.2 10*3/uL (ref 0.0–0.7)
EOS PCT: 2.8 % (ref 0.0–5.0)
HCT: 41.2 % (ref 39.0–52.0)
Hemoglobin: 13.9 g/dL (ref 13.0–17.0)
Lymphocytes Relative: 30 % (ref 12.0–46.0)
Lymphs Abs: 2.2 10*3/uL (ref 0.7–4.0)
MCHC: 33.7 g/dL (ref 30.0–36.0)
MCV: 88.9 fl (ref 78.0–100.0)
Monocytes Absolute: 0.6 10*3/uL (ref 0.1–1.0)
Monocytes Relative: 7.9 % (ref 3.0–12.0)
NEUTROS PCT: 59 % (ref 43.0–77.0)
Neutro Abs: 4.3 10*3/uL (ref 1.4–7.7)
PLATELETS: 180 10*3/uL (ref 150.0–400.0)
RBC: 4.64 Mil/uL (ref 4.22–5.81)
RDW: 14.6 % (ref 11.5–14.6)
WBC: 7.3 10*3/uL (ref 4.5–10.5)

## 2013-02-18 LAB — HEPATIC FUNCTION PANEL
ALT: 27 U/L (ref 0–53)
AST: 20 U/L (ref 0–37)
Albumin: 3.9 g/dL (ref 3.5–5.2)
Alkaline Phosphatase: 84 U/L (ref 39–117)
Bilirubin, Direct: 0.1 mg/dL (ref 0.0–0.3)
Total Bilirubin: 0.6 mg/dL (ref 0.3–1.2)
Total Protein: 7.4 g/dL (ref 6.0–8.3)

## 2013-02-18 LAB — MICROALBUMIN / CREATININE URINE RATIO
Creatinine,U: 47.3 mg/dL
Microalb Creat Ratio: 0.4 mg/g (ref 0.0–30.0)
Microalb, Ur: 0.2 mg/dL (ref 0.0–1.9)

## 2013-02-18 LAB — URINALYSIS, ROUTINE W REFLEX MICROSCOPIC
BILIRUBIN URINE: NEGATIVE
HGB URINE DIPSTICK: NEGATIVE
Ketones, ur: NEGATIVE
LEUKOCYTES UA: NEGATIVE
NITRITE: NEGATIVE
PH: 6 (ref 5.0–8.0)
RBC / HPF: NONE SEEN (ref 0–?)
Specific Gravity, Urine: 1.015 (ref 1.000–1.030)
Total Protein, Urine: NEGATIVE
Urine Glucose: >=1000 — AB
Urobilinogen, UA: 0.2 (ref 0.0–1.0)
WBC UA: NONE SEEN (ref 0–?)

## 2013-02-18 LAB — BASIC METABOLIC PANEL
BUN: 11 mg/dL (ref 6–23)
CHLORIDE: 101 meq/L (ref 96–112)
CO2: 31 mEq/L (ref 19–32)
CREATININE: 0.8 mg/dL (ref 0.4–1.5)
Calcium: 10.1 mg/dL (ref 8.4–10.5)
GFR: 105.52 mL/min (ref >60.00)
GLUCOSE: 273 mg/dL — AB (ref 70–99)
Potassium: 4.8 mEq/L (ref 3.5–5.1)
Sodium: 138 mEq/L (ref 135–145)

## 2013-02-18 LAB — LIPID PANEL
CHOLESTEROL: 167 mg/dL (ref 0–200)
HDL: 48 mg/dL (ref >39.00)
LDL Cholesterol: 94 mg/dL (ref 0–99)
TRIGLYCERIDES: 123 mg/dL (ref 0.0–149.0)
Total CHOL/HDL Ratio: 3
VLDL: 24.6 mg/dL (ref 0.0–40.0)

## 2013-02-18 LAB — HEMOGLOBIN A1C: HEMOGLOBIN A1C: 8.7 % — AB (ref 4.6–6.5)

## 2013-02-18 LAB — PSA: PSA: 7.23 ng/mL — ABNORMAL HIGH (ref 0.10–4.00)

## 2013-02-18 LAB — TSH: TSH: 1.32 u[IU]/mL (ref 0.35–5.50)

## 2013-02-18 MED ORDER — GABAPENTIN 300 MG PO CAPS: ORAL_CAPSULE | ORAL | Status: DC

## 2013-02-18 MED ORDER — SITAGLIPTIN PHOSPHATE 100 MG PO TABS: 100.0000 mg | ORAL_TABLET | Freq: Every day | ORAL | Status: DC

## 2013-02-18 NOTE — Patient Instructions (Addendum)
Please see robin to help with your application for the patient assistance program for Januvia Please make a nurse Appt in 2 wks for the new Prevnar pneumonia shot OK to increase the januvia to 100 mg (through [employer] patient assist program) OK to increase the gabapentin to 600 mg three times per day (sent to your [facility] pharmacy) Please continue all other medications as before Please have the pharmacy call with any other refills you may need. Please keep your appointments with your specialists as you have planned - cardiology  Please continue your efforts at being more active, low cholesterol diet, and weight control. You are otherwise up to date with prevention measures today.  Please stop at the [facility] desk to make a followup appt for your hand, as well as the right heel pain with Dr Smith/sports medicine for about Feb 1  Please go to the LAB in the Basement (turn left off the elevator) for the tests to be done today You will be contacted by phone if any changes need to be made immediately.  Otherwise, you will receive a letter about your results with an explanation, but please check with MyChart first.  Please remember to sign up for My Chart if you have not done so, as this will be important to you in the future with finding out test results, communicating by private email, and scheduling acute appointments online when needed.  Please return in 6 months, or sooner if needed

## 2013-02-18 NOTE — Assessment & Plan Note (Signed)
For soft soled shoes, pain control, f/u with sport med/dr Tamala Julian about feb 1

## 2013-02-18 NOTE — Assessment & Plan Note (Addendum)
Ok to incr the [place] to 100 qd, cont other meds, mild uncontrolled Lab Results  Component Value Date   HGBA1C 7.7* 08/15/2012

## 2013-02-18 NOTE — Progress Notes (Signed)
Subjective:    Patient ID: [name], male    DOB: 1945/03/10, 68 y.o.   MRN: [id number]  HPI Here for wellness and f/u;  Overall doing ok;  Pt denies CP, worsening SOB, DOE, wheezing, orthopnea, PND, worsening LE edema, palpitations, dizziness or syncope.  Pt denies neurological change such as new headache, facial or extremity weakness.  Pt denies polydipsia, polyuria, or low sugar symptoms. Pt states overall good compliance with treatment and medications, good tolerability, and has been trying to follow lower cholesterol diet.  Pt denies worsening depressive symptoms, suicidal ideation or panic. No fever, night sweats, wt loss, loss of appetite, or other constitutional symptoms.  Pt states good ability with ADL's, has low fall risk, home safety reviewed and adequate, no other significant changes in hearing or vision, and only occasionally active with exercise, even less recently due to onset right plantar fasciitis like plantar right heel pain.  Right hand improved after tx per Sports med/dr Tamala Julian jan 5.  Needs Januvia through pt assist program Past Medical History  Diagnosis Date  . 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 DIABETES MELLITUS, TYPE II 08/29/2006  . HYPERLIPIDEMIA 08/29/2006  . GOUT 04/22/2007  . Overweight 08/29/2006  . ERECTILE DYSFUNCTION 11/26/2006  . DEPRESSION 08/29/2006  . HYPERTENSION 08/29/2006  . CORONARY ARTERY DISEASE 08/29/2006  . 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 DIASTOLIC HEART FAILURE, CHRONIC 06/09/2008  . BENIGN PROSTATIC HYPERTROPHY 08/29/2006  . LUMBAR RADICULOPATHY, RIGHT 06/10/2007  . INSOMNIA-SLEEP DISORDER-UNSPEC 10/23/2007  . PACEMAKER, PERMANENT 03/04/2008    pt denies this date  . Left lumbar radiculopathy 05/30/2010  . Myocardial infarction 12/27/10    "I've had several MIs"  . Sleep apnea     "if I lay flat I quit breathing; HOB up I'm fine"  . Arthritis     "lower back; going back down both  my sciatic nerves"  . Syncope and collapse 12/27/10  . Aortic root dilatation 02/02/2011   Past Surgical History  Procedure Laterality Date  . S/p left arm surgury after work accident  1991    "2000# steel fell on it"  . S/p right hand surgury for foreign object  1970's    "piece of wood went in my hand; had to get that out"  . Insert / replace / remove pacemaker  ~ 2004    initial pacemaker placement  . Insert / replace / remove pacemaker  10/2009    generator change  . Coronary artery bypass graft  1992    CABG X 2  . Coronary angioplasty with stent placement  12/27/10    "I've had a total of 9 cardiac stents put in"    reports that he quit smoking about 42 years ago. His smoking use included Cigarettes. He has a 27 pack-year smoking history. He has quit using smokeless tobacco. He reports that he does not drink alcohol or use illicit drugs. family history includes Coronary artery disease (age of onset: 78) in his other; Diabetes in his mother, other, and sister; Heart disease in his sister and sister. Allergies  Allergen Reactions  . Crestor [Rosuvastatin Calcium] Other (See Comments)    Common to girls period.   Current Outpatient Prescriptions on File Prior to Visit  Medication Sig Dispense Refill  . allopurinol (ZYLOPRIM) 300 MG tablet take 1 tablet by mouth once daily  90 tablet  1  . AMBULATORY NON FORMULARY MEDICATION Medication Name: Johnson  1 Device  0  . aspirin EC 81 MG tablet Take 1 tablet (81 mg total) by mouth daily.      Marland Kitchen atorvastatin (LIPITOR) 80 MG tablet Take 1 tablet (80 mg total) by mouth daily.  90 tablet  1  . carvedilol (COREG) 25 MG tablet Take one half tablet twice daily  180 tablet  1  . cetirizine (ZYRTEC) 10 MG tablet Take 1 tablet (10 mg total) by mouth daily.  30 tablet  11  . cyclobenzaprine (FLEXERIL) 5 MG tablet Take 1 tablet (5 mg total) by mouth 3 (three) times daily as needed. For muscle spasms  270 tablet  1  . docusate  sodium (COLACE) 100 MG capsule Take 100 mg by mouth 2 (two) times daily as needed.      . fluticasone (FLONASE) 50 MCG/ACT nasal spray Place 2 sprays into both nostrils daily.  16 g  1  . furosemide (LASIX) 40 MG tablet Take 1 tablet (40 mg total) by mouth daily.  90 tablet  1  . glucose blood (ONE TOUCH ULTRA TEST) test strip Use as instructed three times per day  300 each  12  . HYDROcodone-acetaminophen (NORCO) 10-325 MG per tablet Take 1 tablet by mouth every 8 (eight) hours as needed. To fill Mar 01, 2013  90 tablet  0  . ibuprofen (ADVIL,MOTRIN) 200 MG tablet Take 800 mg by mouth every 6 (six) hours as needed.      . indomethacin (INDOCIN) 50 MG capsule Take 1 capsule (50 mg total) by mouth 2 (two) times daily with a meal.  180 capsule  1  . lisinopril (PRINIVIL,ZESTRIL) 2.5 MG tablet Take 1 tablet (2.5 mg total) by mouth daily.  90 tablet  1  . metFORMIN (GLUCOPHAGE) 500 MG tablet TAKE 4 TABLETS ONCE DAILY  360 tablet  1  . Multiple Vitamin (MULTIVITAMIN) capsule Take 1 capsule by mouth daily.      . potassium chloride SA (K-DUR,KLOR-CON) 20 MEQ tablet Take 1 tablet (20 mEq total) by mouth daily.  90 tablet  1   No current facility-administered medications on file prior to visit.   Asks for increased gabapentin as helps with pain, but not enough. Declines colonoscpy  Review of Systems Constitutional: Negative for diaphoresis, activity change, appetite change or unexpected weight change.  HENT: Negative for hearing loss, ear pain, facial swelling, mouth sores and neck stiffness.   Eyes: Negative for pain, redness and visual disturbance.  Respiratory: Negative for shortness of breath and wheezing.   Cardiovascular: Negative for chest pain and palpitations.  Gastrointestinal: Negative for diarrhea, blood in stool, abdominal distention or other pain Genitourinary: Negative for hematuria, flank pain or change in urine volume.  Musculoskeletal: Negative for myalgias and joint swelling.    Skin: Negative for color change and wound.  Neurological: Negative for syncope and numbness. other than noted Hematological: Negative for adenopathy.  Psychiatric/Behavioral: Negative for hallucinations, self-injury, decreased concentration and agitation.      Objective:   Physical Exam BP 138/72  Pulse 61  Temp(Src) 97 F (36.1 C) (Oral)  Wt 219 lb (99.338 kg)  SpO2 94% VS noted,  Constitutional: Pt is oriented to person, place, and time. Appears well-developed and well-nourished.  Head: Normocephalic and atraumatic.  Right Ear: External ear normal.  Left Ear: External ear normal.  Nose: Nose normal.  Mouth/Throat: Oropharynx is clear and moist.  Eyes: Conjunctivae and EOM are normal. Pupils are equal, round, and reactive to light.  Neck: Normal range  of motion. Neck supple. No JVD present. No tracheal deviation present.  Cardiovascular: Normal rate, regular rhythm, normal heart sounds and intact distal pulses.   Pulmonary/Chest: Effort normal and breath sounds normal.  Abdominal: Soft. Bowel sounds are normal. There is no tenderness. No HSM  Musculoskeletal: Normal range of motion. Exhibits no edema.  Lymphadenopathy:  Has no cervical adenopathy.  Neurological: Pt is alert and oriented to person, place, and time. Pt has normal reflexes. No cranial nerve deficit. Decr sens to LT distal toes.  DM foot exam as documented Skin: Skin is warm and dry. No rash noted.  Psychiatric:  Has  normal mood and affect. Behavior is normal.      Assessment & Plan:

## 2013-02-18 NOTE — Assessment & Plan Note (Signed)

## 2013-02-18 NOTE — Assessment & Plan Note (Signed)
For card referral f/u

## 2013-02-18 NOTE — Progress Notes (Signed)
Pre-visit discussion using our clinic review tool. No additional management support is needed unless otherwise documented below in the visit note.  

## 2013-02-18 NOTE — Assessment & Plan Note (Signed)
For card referral f/u as required by insurance

## 2013-02-19 ENCOUNTER — Other Ambulatory Visit: Payer: Self-pay | Admitting: Internal Medicine

## 2013-02-19 ENCOUNTER — Other Ambulatory Visit: Payer: Self-pay

## 2013-02-19 ENCOUNTER — Encounter: Payer: Self-pay | Admitting: Internal Medicine

## 2013-02-19 ENCOUNTER — Telehealth: Payer: Self-pay

## 2013-02-19 DIAGNOSIS — M5431 Sciatica, right side: Secondary | ICD-10-CM

## 2013-02-19 DIAGNOSIS — R972 Elevated prostate specific antigen [PSA]: Secondary | ICD-10-CM

## 2013-02-19 MED ORDER — GLIPIZIDE ER 5 MG PO TB24: 5.0000 mg | ORAL_TABLET | Freq: Every day | ORAL | Status: DC

## 2013-02-19 MED ORDER — CYCLOBENZAPRINE HCL 5 MG PO TABS: 5.0000 mg | ORAL_TABLET | Freq: Three times a day (TID) | ORAL | Status: DC | PRN

## 2013-02-19 NOTE — Telephone Encounter (Signed)
Already done jan 20

## 2013-02-19 NOTE — Telephone Encounter (Signed)
Mail order refills done.

## 2013-02-26 ENCOUNTER — Telehealth: Payer: Self-pay

## 2013-02-26 MED ORDER — ONETOUCH ULTRA MINI W/DEVICE KIT: PACK | Status: DC

## 2013-02-26 MED ORDER — ONETOUCH DELICA LANCETS 33G MISC: Status: DC

## 2013-02-26 NOTE — Telephone Encounter (Signed)
Mail order refill.

## 2013-03-04 ENCOUNTER — Ambulatory Visit (INDEPENDENT_AMBULATORY_CARE_PROVIDER_SITE_OTHER): Payer: Commercial Managed Care - HMO | Admitting: *Deleted

## 2013-03-04 ENCOUNTER — Other Ambulatory Visit: Payer: Self-pay

## 2013-03-04 ENCOUNTER — Ambulatory Visit (INDEPENDENT_AMBULATORY_CARE_PROVIDER_SITE_OTHER): Payer: Medicare HMO | Admitting: Family Medicine

## 2013-03-04 ENCOUNTER — Encounter: Payer: Self-pay | Admitting: Family Medicine

## 2013-03-04 VITALS — BP 136/64 | HR 97 | Temp 97.7°F | Resp 18 | Wt 223.0 lb

## 2013-03-04 DIAGNOSIS — Z23 Encounter for immunization: Secondary | ICD-10-CM

## 2013-03-04 DIAGNOSIS — M7989 Other specified soft tissue disorders: Secondary | ICD-10-CM

## 2013-03-04 MED ORDER — INDOMETHACIN 50 MG PO CAPS: 50.0000 mg | ORAL_CAPSULE | Freq: Two times a day (BID) | ORAL | Status: DC

## 2013-03-04 NOTE — Progress Notes (Signed)
Pre-visit discussion using our clinic review tool. No additional management support is needed unless otherwise documented below in the visit note.  

## 2013-03-04 NOTE — Patient Instructions (Signed)
Good to see you Your hand looks better which is great! Try the exercises for your back 2 times a week Consider physical therapy for your back, they may be able to give you some exercsies that won't make you out of breath See you on Friday!

## 2013-03-04 NOTE — Progress Notes (Signed)
CC: Right hand swollen followup  HPI: Patient is here following up for his right hand. Patient had pain and swelling and was diagnosed with gout flare. Patient states that it is completely resolved at this time he has full movement. Patient denies any radiation or any numbness and is able to do all activities of daily living. Patient is very happy with the results.  Past medical, surgical, family and social history reviewed. Medications reviewed all in the electronic medical record.   Review of Systems: No headache, visual changes, nausea, vomiting, diarrhea, constipation, dizziness, abdominal pain, skin rash, fevers, chills, night sweats, weight loss, swollen lymph nodes, body aches, joint swelling, muscle aches, chest pain, shortness of breath, mood changes.   Objective:    Blood pressure 136/64, pulse 97, temperature 97.7 F (36.5 C), temperature source Oral, resp. rate 18, weight 223 lb (101.152 kg), SpO2 97.00%. Temperature 98.60F   General: No apparent distress alert and oriented x3 mood and affect normal, dressed appropriately.  HEENT: Pupils equal, extraocular movements intact Respiratory: Patient's speak in full sentences and does not appear short of breath Cardiovascular: No lower extremity edema, non tender, no erythema Skin: Warm dry intact with no signs of infection or rash on extremities or on axial skeleton. Abdomen: Soft nontender Neuro: Cranial nerves II through XII are intact, neurovascularly intact in all extremities with 2+ DTRs and 2+ pulses. Lymph: No lymphadenopathy of posterior or anterior cervical chain or axillae bilaterally.  Gait normal with good balance and coordination.  MSK: Non tender with full range of motion and good stability and symmetric strength and tone of shoulders, elbows,  hip, knee and ankles bilaterally.  Wrist: Right Inspection shows the patient does have only osteoarthritic changes of the pIP and the DIP joints. No erythema noted. Patient is  neurovascularly intact distally with full strength.  Impression and Recommendations:     This case required medical decision making of moderate complexity.

## 2013-03-04 NOTE — Assessment & Plan Note (Signed)
Completely resolved at this time. Patient is to monitor his diet, continue with the allopurinol. Patient come back on an as-needed basis. [place] patellar patient would like to have a removal of a skin tag we did run times a day. Patient will come back in 48 hours for further evaluation and possibly removal.

## 2013-03-07 ENCOUNTER — Encounter: Payer: Self-pay | Admitting: Family Medicine

## 2013-03-07 ENCOUNTER — Ambulatory Visit (INDEPENDENT_AMBULATORY_CARE_PROVIDER_SITE_OTHER): Payer: Medicare HMO | Admitting: Family Medicine

## 2013-03-07 VITALS — BP 130/74 | HR 64 | Temp 96.9°F | Wt 220.8 lb

## 2013-03-07 DIAGNOSIS — L918 Other hypertrophic disorders of the skin: Secondary | ICD-10-CM

## 2013-03-07 DIAGNOSIS — L909 Atrophic disorder of skin, unspecified: Secondary | ICD-10-CM

## 2013-03-07 DIAGNOSIS — L919 Hypertrophic disorder of the skin, unspecified: Secondary | ICD-10-CM

## 2013-03-07 NOTE — Progress Notes (Signed)
Pre visit review using our clinic review tool, if applicable. No additional management support is needed unless otherwise documented below in the visit note. 

## 2013-03-07 NOTE — Progress Notes (Signed)
S: The patient complains of symptomatic skin tags on the left arm. This #1 and states that his fairly large. Patient has had one on his nose before and it was removed. Patient denies any significant changes This are irritated by clothing, jewelry and rubbing. Patient would like to have it removed  O: Patient appears well.  Skin exam shows on patient's upper medial portion of his arm patient does have a pedunculated mass is soft and nontender on exam.  A: Skin tags   P: Skin tags are snipped off using Betadine for cleansing and sterile iris scissors. Local anesthesia was used 0.5% Marcaine at 2 cc in the amount. These pathognomonic lesions are not sent for pathology. Patient at the end of the procedure was at a pressure dressing. Patient is on a blood thinner. Patient was given a change of dressing if necessary. Otherwise he will do post removal instructions given.  Followup on Monday if any concerns.

## 2013-03-07 NOTE — Patient Instructions (Signed)
Good to see you Leave wrap on for today.  The use a band aid for the next couple days.  Ice can help with any pain.  See me when you need me.

## 2013-03-25 ENCOUNTER — Telehealth: Payer: Self-pay | Admitting: *Deleted

## 2013-03-25 MED ORDER — HYDROCODONE-ACETAMINOPHEN 10-325 MG PO TABS: 1.0000 | ORAL_TABLET | Freq: Three times a day (TID) | ORAL | Status: DC | PRN

## 2013-03-25 NOTE — Telephone Encounter (Signed)
Patient phoned requesting refill on hydrocodone.  Last OV with PCP 03/07/13 and med last ordered 02/11/13. No contract on file.  Please advise.  CB# [phone]

## 2013-03-25 NOTE — Telephone Encounter (Signed)
Patient informed to pickup hardcopy's at the front desk and did explain of instruction on future refills.

## 2013-03-25 NOTE — Telephone Encounter (Signed)
Jessamine for 3 mo - Done Forensic psychologist to [employer], But -   Robin to remind pt, he had a letter in dec 2014 to let him know I will no longer be able to provide regular monthly pain medication refills, as this is not part of my practice any longer  I can refer to pain management if he likes

## 2013-03-28 ENCOUNTER — Telehealth: Payer: Self-pay | Admitting: Internal Medicine

## 2013-03-28 NOTE — Telephone Encounter (Signed)
Called the patient explained that PCP would no longer be doing monthly refills of scheduled ll or higher medications and if needed monthly would need to refer to pain management.  The patient stated he would give this some thought and call back.

## 2013-03-28 NOTE — Telephone Encounter (Signed)
Pt had questions about pain management in regards to the letter he received after picking up scripts.  Please contact pt.

## 2013-03-30 ENCOUNTER — Encounter (HOSPITAL_COMMUNITY): Payer: Self-pay | Admitting: Emergency Medicine

## 2013-03-30 ENCOUNTER — Emergency Department (HOSPITAL_COMMUNITY): Payer: Medicare HMO

## 2013-03-30 ENCOUNTER — Emergency Department (HOSPITAL_COMMUNITY)
Admission: EM | Admit: 2013-03-30 | Discharge: 2013-03-30 | Disposition: A | Discharge status: Home / Self Care | Payer: Medicare HMO | Attending: Emergency Medicine | Admitting: Emergency Medicine

## 2013-03-30 DIAGNOSIS — S298XXA Other specified injuries of thorax, initial encounter: Secondary | ICD-10-CM | POA: Insufficient documentation

## 2013-03-30 DIAGNOSIS — Z79899 Other long term (current) drug therapy: Secondary | ICD-10-CM | POA: Insufficient documentation

## 2013-03-30 DIAGNOSIS — I252 Old myocardial infarction: Secondary | ICD-10-CM | POA: Insufficient documentation

## 2013-03-30 DIAGNOSIS — W1809XA Striking against other object with subsequent fall, initial encounter: Secondary | ICD-10-CM | POA: Insufficient documentation

## 2013-03-30 DIAGNOSIS — I2510 Atherosclerotic heart disease of native coronary artery without angina pectoris: Secondary | ICD-10-CM | POA: Insufficient documentation

## 2013-03-30 DIAGNOSIS — I5032 Chronic diastolic (congestive) heart failure: Secondary | ICD-10-CM | POA: Insufficient documentation

## 2013-03-30 DIAGNOSIS — Z951 Presence of aortocoronary bypass graft: Secondary | ICD-10-CM | POA: Insufficient documentation

## 2013-03-30 DIAGNOSIS — F3289 Other specified depressive episodes: Secondary | ICD-10-CM | POA: Insufficient documentation

## 2013-03-30 DIAGNOSIS — Z87891 Personal history of nicotine dependence: Secondary | ICD-10-CM | POA: Insufficient documentation

## 2013-03-30 DIAGNOSIS — Y929 Unspecified place or not applicable: Secondary | ICD-10-CM | POA: Insufficient documentation

## 2013-03-30 DIAGNOSIS — Z8739 Personal history of other diseases of the musculoskeletal system and connective tissue: Secondary | ICD-10-CM | POA: Insufficient documentation

## 2013-03-30 DIAGNOSIS — Z9861 Coronary angioplasty status: Secondary | ICD-10-CM | POA: Insufficient documentation

## 2013-03-30 DIAGNOSIS — Z950 Presence of cardiac pacemaker: Secondary | ICD-10-CM | POA: Insufficient documentation

## 2013-03-30 DIAGNOSIS — M109 Gout, unspecified: Secondary | ICD-10-CM | POA: Insufficient documentation

## 2013-03-30 DIAGNOSIS — R0781 Pleurodynia: Secondary | ICD-10-CM

## 2013-03-30 DIAGNOSIS — F329 Major depressive disorder, single episode, unspecified: Secondary | ICD-10-CM | POA: Insufficient documentation

## 2013-03-30 DIAGNOSIS — Z8546 Personal history of malignant neoplasm of prostate: Secondary | ICD-10-CM | POA: Insufficient documentation

## 2013-03-30 DIAGNOSIS — G8929 Other chronic pain: Secondary | ICD-10-CM | POA: Insufficient documentation

## 2013-03-30 DIAGNOSIS — Y939 Activity, unspecified: Secondary | ICD-10-CM | POA: Insufficient documentation

## 2013-03-30 DIAGNOSIS — E785 Hyperlipidemia, unspecified: Secondary | ICD-10-CM | POA: Insufficient documentation

## 2013-03-30 DIAGNOSIS — W19XXXA Unspecified fall, initial encounter: Secondary | ICD-10-CM

## 2013-03-30 DIAGNOSIS — Z7982 Long term (current) use of aspirin: Secondary | ICD-10-CM | POA: Insufficient documentation

## 2013-03-30 DIAGNOSIS — IMO0002 Reserved for concepts with insufficient information to code with codable children: Secondary | ICD-10-CM | POA: Insufficient documentation

## 2013-03-30 DIAGNOSIS — S0990XA Unspecified injury of head, initial encounter: Secondary | ICD-10-CM | POA: Insufficient documentation

## 2013-03-30 DIAGNOSIS — Z87448 Personal history of other diseases of urinary system: Secondary | ICD-10-CM | POA: Insufficient documentation

## 2013-03-30 DIAGNOSIS — E663 Overweight: Secondary | ICD-10-CM | POA: Insufficient documentation

## 2013-03-30 DIAGNOSIS — I10 Essential (primary) hypertension: Secondary | ICD-10-CM | POA: Insufficient documentation

## 2013-03-30 DIAGNOSIS — E119 Type 2 diabetes mellitus without complications: Secondary | ICD-10-CM | POA: Insufficient documentation

## 2013-03-30 DIAGNOSIS — R42 Dizziness and giddiness: Secondary | ICD-10-CM | POA: Insufficient documentation

## 2013-03-30 MED ORDER — HYDROCODONE-ACETAMINOPHEN 5-325 MG PO TABS
2.0000 | ORAL_TABLET | Freq: Once | ORAL | Status: AC
  Administered 2013-03-30: 2 via ORAL
  Filled 2013-03-30: qty 2

## 2013-03-30 NOTE — Discharge Instructions (Signed)
Head Injury, Adult °You have received a head injury. It does not appear serious at this time. Headaches and vomiting are common following head injury. It should be easy to awaken from sleeping. Sometimes it is necessary for you to stay in the emergency department for a while for observation. Sometimes admission to the hospital may be needed. After injuries such as yours, most problems occur within the first 24 hours, but side effects may occur up to 7 10 days after the injury. It is important for you to carefully monitor your condition and contact your health care provider or seek immediate medical care if there is a change in your condition. °WHAT ARE THE TYPES OF HEAD INJURIES? °Head injuries can be as minor as a bump. Some head injuries can be more severe. More severe head injuries include: °· A jarring injury to the brain (concussion). °· A bruise of the brain (contusion). This mean there is bleeding in the brain that can cause swelling. °· A cracked skull (skull fracture). °· Bleeding in the brain that collects, clots, and forms a bump (hematoma). °WHAT CAUSES A HEAD INJURY? °A serious head injury is most likely to happen to someone who is in a car wreck and is not wearing a seat belt. Other causes of major head injuries include bicycle or motorcycle accidents, sports injuries, and falls. °HOW ARE HEAD INJURIES DIAGNOSED? °A complete history of the event leading to the injury and your current symptoms will be helpful in diagnosing head injuries. Many times, pictures of the brain, such as CT or MRI are needed to see the extent of the injury. Often, an overnight hospital stay is necessary for observation.  °WHEN SHOULD I SEEK IMMEDIATE MEDICAL CARE?  °You should get help right away if: °· You have confusion or drowsiness. °· You feel sick to your stomach (nauseous) or have continued, forceful vomiting. °· You have dizziness or unsteadiness that is getting worse. °· You have severe, continued headaches not  relieved by medicine. Only take over-the-counter or prescription medicines for pain, fever, or discomfort as directed by your health care provider. °· You do not have normal function of the arms or legs or are unable to walk. °· You notice changes in the black spots in the center of the colored part of your eye (pupil). °· You have a clear or bloody fluid coming from your nose or ears. °· You have a loss of vision. °During the next 24 hours after the injury, you must stay with someone who can watch you for the warning signs. This person should contact local emergency services (911 in the [place] if you have seizures, you become unconscious, or you are unable to wake up. °HOW CAN I PREVENT A HEAD INJURY IN THE FUTURE? °The most important factor for preventing major head injuries is avoiding motor vehicle accidents.  To minimize the potential for damage to your head, it is crucial to wear seat belts while riding in motor vehicles. Wearing helmets while bike riding and playing collision sports (like football) is also helpful. Also, avoiding dangerous activities around the house will further help reduce your risk of head injury.  °WHEN CAN I RETURN TO NORMAL ACTIVITIES AND ATHLETICS? °You should be reevaluated by your health care provider before returning to these activities. If you have any of the following symptoms, you should not return to activities or contact sports until 1 week after the symptoms have stopped: °· Persistent headache. °· Dizziness or vertigo. °· Poor attention and concentration. °·   Confusion.  Memory problems.  Nausea or vomiting.  Fatigue or tire easily.  Irritability.  Intolerant of bright lights or loud noises.  Anxiety or depression.  Disturbed sleep. MAKE SURE YOU:   Understand these instructions.  Will watch your condition.  Will get help right away if you are not doing well or get worse. Document Released: 01/16/2005 Document Revised: 11/06/2012 Document Reviewed:  09/23/2012 [facility] Patient Information 2014 [place].  Musculoskeletal Pain Musculoskeletal pain is muscle and boney aches and pains. These pains can occur in any part of the body. Your caregiver may treat you without knowing the cause of the pain. They may treat you if blood or urine tests, X-rays, and other tests were normal.  CAUSES There is often not a definite cause or reason for these pains. These pains may be caused by a type of germ (virus). The discomfort may also come from overuse. Overuse includes working out too hard when your body is not fit. Boney aches also come from weather changes. Bone is sensitive to atmospheric pressure changes. HOME CARE INSTRUCTIONS   Ask when your test results will be ready. Make sure you get your test results.  Only take over-the-counter or prescription medicines for pain, discomfort, or fever as directed by your caregiver. If you were given medications for your condition, do not drive, operate machinery or power tools, or sign legal documents for 24 hours. Do not drink alcohol. Do not take sleeping pills or other medications that may interfere with treatment.  Continue all activities unless the activities cause more pain. When the pain lessens, slowly resume normal activities. Gradually increase the intensity and duration of the activities or exercise.  During periods of severe pain, bed rest may be helpful. Lay or sit in any position that is comfortable.  Putting ice on the injured area.  Put ice in a bag.  Place a towel between your skin and the bag.  Leave the ice on for 15 to 20 minutes, 3 to 4 times a day.  Follow up with your caregiver for continued problems and no reason can be found for the pain. If the pain becomes worse or does not go away, it may be necessary to repeat tests or do additional testing. Your caregiver may need to look further for a possible cause. SEEK IMMEDIATE MEDICAL CARE IF:  You have pain that is getting worse  and is not relieved by medications.  You develop chest pain that is associated with shortness or breath, sweating, feeling sick to your stomach (nauseous), or throw up (vomit).  Your pain becomes localized to the abdomen.  You develop any new symptoms that seem different or that concern you. MAKE SURE YOU:   Understand these instructions.  Will watch your condition.  Will get help right away if you are not doing well or get worse. Document Released: 01/16/2005 Document Revised: 04/10/2011 Document Reviewed: 09/20/2012 [facility] Patient Information 2014 [place].

## 2013-03-30 NOTE — ED Provider Notes (Signed)
CSN: [id number]     Arrival date & time 03/30/13  1117 History  This chart was scribed for  B. , MD by Matthew Underwood, ED scribe.  This patient was seen in room APA07/APA07 and the patient's care was started at 11:32 AM.   Chief Complaint  Patient presents with  . Fall  . Back Pain  . Head Injury    The history is provided by the patient. No language interpreter was used.    HPI Comments: [name] is a 68 y.o. male with h/o arthritis who presents to the Emergency Department complaining of a fall that occurred 2.5 hours ago.  Pt reports that he slipped on ice and fell backwards with impact to his head.  He states he became lightheaded but denies LOC and was able to stand back up on his own and walk back to his house.  Currently he complains of constant, moderate-to-severe pain to the posterior head and his right lower back.  He took a small amount of Advil after the fall.  He takes 2 hydrocodone tablets occasionally for chronic pain but did not take any after his fall today.  He denies pain or injury to legs.  He uses aspirin regularly but is unsure whether he is on blood-thinners.   Past Medical History  Diagnosis Date  . 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 DIABETES MELLITUS, TYPE II 08/29/2006  . HYPERLIPIDEMIA 08/29/2006  . GOUT 04/22/2007  . Overweight 08/29/2006  . ERECTILE DYSFUNCTION 11/26/2006  . DEPRESSION 08/29/2006  . HYPERTENSION 08/29/2006  . CORONARY ARTERY DISEASE 08/29/2006  . 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 DIASTOLIC HEART FAILURE, CHRONIC 06/09/2008  . BENIGN PROSTATIC HYPERTROPHY 08/29/2006  . LUMBAR RADICULOPATHY, RIGHT 06/10/2007  . INSOMNIA-SLEEP DISORDER-UNSPEC 10/23/2007  . PACEMAKER, PERMANENT 03/04/2008    pt denies this date  . Left lumbar radiculopathy 05/30/2010  . Myocardial infarction 12/27/10    "I've had several MIs"  . Sleep apnea     "if I lay flat I quit breathing; HOB up  I'm fine"  . Arthritis     "lower back; going back down both my sciatic nerves"  . Syncope and collapse 12/27/10  . Aortic root dilatation 02/02/2011    Past Surgical History  Procedure Laterality Date  . S/p left arm surgury after work accident  1991    "2000# steel fell on it"  . S/p right hand surgury for foreign object  1970's    "piece of wood went in my hand; had to get that out"  . Insert / replace / remove pacemaker  ~ 2004    initial pacemaker placement  . Insert / replace / remove pacemaker  10/2009    generator change  . Coronary artery bypass graft  1992    CABG X 2  . Coronary angioplasty with stent placement  12/27/10    "I've had a total of 9 cardiac stents put in"    Family History  Problem Relation Age of Onset  . Diabetes Mother   . Diabetes Sister   . Heart disease Sister     2 sister died with heart disease  . Coronary artery disease Other 50    male, first degree relative  . Diabetes Other     1st degree relative  . Heart disease Sister     History  Substance Use Topics  . Smoking status: Former Smoker -- 3.00 packs/day for 9 years      Types: Cigarettes    Quit date: 12/09/1970  . Smokeless tobacco: Former Systems developer  . Alcohol Use: No     Review of Systems A complete 10 system review of systems was obtained and all systems are negative except as noted in the HPI and PMH.     Allergies  Crestor  Home Medications   Current Outpatient Rx  Name  Route  Sig  Dispense  Refill  . allopurinol (ZYLOPRIM) 300 MG tablet      take 1 tablet by mouth once daily   90 tablet   1   . AMBULATORY NON FORMULARY MEDICATION      Medication Name: LUMBAR BACK SUPPORT WORK BRACE   1 Device   0   . aspirin EC 81 MG tablet   Oral   Take 1 tablet (81 mg total) by mouth daily.         Marland Kitchen atorvastatin (LIPITOR) 80 MG tablet   Oral   Take 1 tablet (80 mg total) by mouth daily.   90 tablet   1   . Blood Glucose Monitoring Suppl (ONE TOUCH ULTRA MINI)  W/DEVICE KIT      Use as directed three times daily to check blood sugar.  Diagnosis code 250.02.   1 each   0   . carvedilol (COREG) 25 MG tablet      Take one half tablet twice daily   180 tablet   1   . cetirizine (ZYRTEC) 10 MG tablet   Oral   Take 1 tablet (10 mg total) by mouth daily.   30 tablet   11   . cyclobenzaprine (FLEXERIL) 5 MG tablet   Oral   Take 1 tablet (5 mg total) by mouth 3 (three) times daily as needed. For muscle spasms   270 tablet   3   . docusate sodium (COLACE) 100 MG capsule   Oral   Take 100 mg by mouth 2 (two) times daily as needed.         . fluticasone (FLONASE) 50 MCG/ACT nasal spray   Each Nare   Place 2 sprays into both nostrils daily.   16 g   1     Fill for 90 day supply   . furosemide (LASIX) 40 MG tablet   Oral   Take 1 tablet (40 mg total) by mouth daily.   90 tablet   1   . gabapentin (NEURONTIN) 300 MG capsule      2 tabs by mouth three times per day   540 capsule   1   . glipiZIDE (GLUCOTROL XL) 5 MG 24 hr tablet   Oral   Take 1 tablet (5 mg total) by mouth daily with breakfast.   90 tablet   3   . glucose blood (ONE TOUCH ULTRA TEST) test strip      Use as instructed three times per day   300 each   12   . HYDROcodone-acetaminophen (NORCO) 10-325 MG per tablet   Oral   Take 1 tablet by mouth every 8 (eight) hours as needed. To fill May 27, 2013   90 tablet   0   . ibuprofen (ADVIL,MOTRIN) 200 MG tablet   Oral   Take 800 mg by mouth every 6 (six) hours as needed.         . indomethacin (INDOCIN) 50 MG capsule   Oral   Take 1 capsule (50 mg total) by mouth 2 (two) times daily with a meal.  180 capsule   3   . lisinopril (PRINIVIL,ZESTRIL) 2.5 MG tablet   Oral   Take 1 tablet (2.5 mg total) by mouth daily.   90 tablet   1   . metFORMIN (GLUCOPHAGE) 500 MG tablet      TAKE 4 TABLETS ONCE DAILY   360 tablet   1   . Multiple Vitamin (MULTIVITAMIN) capsule   Oral   Take 1 capsule by  mouth daily.         Glory Rosebush DELICA LANCETS 46E MISC      Use as directed three times daily to check blood sugar.  Diagnosis code 250.02   300 each   3   . potassium chloride SA (K-DUR,KLOR-CON) 20 MEQ tablet   Oral   Take 1 tablet (20 mEq total) by mouth daily.   90 tablet   1     Dispense as written.   . sitaGLIPtin (JANUVIA) 100 MG tablet   Oral   Take 1 tablet (100 mg total) by mouth daily.   90 tablet   3    BP 124/79  Pulse 59  Temp(Src) 97.5 F (36.4 C) (Oral)  Resp 18  Ht 5' 11" (1.803 m)  Wt 215 lb (97.523 kg)  BMI 30.00 kg/m2  SpO2 99%  Physical Exam  Nursing note and vitals reviewed. Constitutional: He is oriented to person, place, and time. He appears well-developed and well-nourished.  HENT:  Head: Normocephalic and atraumatic.  No tenderness to head, no bruising  Eyes: EOM are normal. Pupils are equal, round, and reactive to light.  Neck: Normal range of motion. Neck supple.  Cardiovascular: Normal rate, regular rhythm, normal heart sounds and intact distal pulses.   No murmur heard. Pulmonary/Chest: Effort normal and breath sounds normal. No respiratory distress. He has no wheezes. He has no rales.  Abdominal: Bowel sounds are normal. He exhibits no distension. There is no tenderness.  Musculoskeletal: Normal range of motion. He exhibits tenderness. He exhibits no edema.  Tenderness over right lower posterior ribs  Neurological: He is alert and oriented to person, place, and time. He has normal strength. No cranial nerve deficit or sensory deficit.  Skin: Skin is warm and dry. No rash noted.  Psychiatric: He has a normal mood and affect.    ED Course  Procedures (including critical care time)  DIAGNOSTIC STUDIES: Oxygen Saturation is 99% on room air, normal by my interpretation.    COORDINATION OF CARE: 11:37 AM-Discussed treatment plan which includes pain medication and imaging with pt at bedside and pt agreed to plan.     Labs  Review Labs Reviewed - No data to display  Imaging Review Dg Ribs Unilateral W/chest Right  03/30/2013   CLINICAL DATA:  Right rib pain  EXAM: RIGHT RIBS AND CHEST - 3+ VIEW  COMPARISON:  12/27/2010  FINDINGS: Four views right ribs submitted. Status post median sternotomy. No acute infiltrate or pulmonary edema. No right rib fracture is identified. No diagnostic pneumothorax.  IMPRESSION: Negative.   Electronically Signed   By: Lahoma Crocker M.D.   On: 03/30/2013 13:07   Ct Head Wo Contrast  03/30/2013   CLINICAL DATA:  Fall with injury to occipital region.  EXAM: CT HEAD WITHOUT CONTRAST  TECHNIQUE: Contiguous axial images were obtained from the base of the skull through the vertex without intravenous contrast.  COMPARISON:  05/03/2012 and 12/28/2010  FINDINGS: Ventricles, cisterns and other CSF spaces are within normal. There is no mass, mass effect, shift  of midline structures or acute hemorrhage. There is no evidence to suggest acute infarction. Possible tiny old left thalamus lacunar infarct. Remaining bones and soft tissues are within normal.  IMPRESSION: No acute intracranial findings.   Electronically Signed   By: [place]  Boyle M.D.   On: 03/30/2013 12:04     EKG Interpretation None      MDM   Final diagnoses:  Fall  Minor head injury  Rib pain on right side    Xrays neg for acute injury. He has hydrocodone at home. ADvised close PCP followup if not improving.     I personally performed the services described in this documentation, which was scribed in my presence. The recorded information has been reviewed and is accurate.       B. , MD 03/30/13 1314 

## 2013-03-30 NOTE — ED Notes (Signed)
Pt slipped on ice this morning and fell, states hit back of head, denies LOC and c/o of back pain as well

## 2013-04-04 ENCOUNTER — Encounter: Payer: Self-pay | Admitting: Internal Medicine

## 2013-04-04 ENCOUNTER — Ambulatory Visit (INDEPENDENT_AMBULATORY_CARE_PROVIDER_SITE_OTHER): Payer: Commercial Managed Care - HMO | Admitting: Internal Medicine

## 2013-04-04 ENCOUNTER — Telehealth: Payer: Self-pay | Admitting: Family Medicine

## 2013-04-04 VITALS — BP 110/62 | HR 64 | Temp 97.5°F | Ht 71.0 in | Wt 226.2 lb

## 2013-04-04 DIAGNOSIS — R51 Headache: Secondary | ICD-10-CM | POA: Diagnosis not present

## 2013-04-04 DIAGNOSIS — R079 Chest pain, unspecified: Secondary | ICD-10-CM | POA: Diagnosis not present

## 2013-04-04 DIAGNOSIS — R972 Elevated prostate specific antigen [PSA]: Secondary | ICD-10-CM | POA: Diagnosis not present

## 2013-04-04 DIAGNOSIS — I10 Essential (primary) hypertension: Secondary | ICD-10-CM | POA: Diagnosis not present

## 2013-04-04 MED ORDER — DOXYCYCLINE HYCLATE 100 MG PO TABS: 100.0000 mg | ORAL_TABLET | Freq: Two times a day (BID) | ORAL | Status: DC

## 2013-04-04 NOTE — Progress Notes (Signed)
Pre visit review using our clinic review tool, if applicable. No additional management support is needed unless otherwise documented below in the visit note. 

## 2013-04-04 NOTE — Progress Notes (Signed)
Subjective:    Patient ID: [name], male    DOB: [birth year], 68 y.o.   MRN: [id number]  HPI  Here to fu after visit to [place], where was taken by EMS called by wife after she witnessed his fall outside on ice; was walking and abruptly ended up on his back striking primarily back of head (mostly right occiput) and right lateral chest both still tender with mild swelling but no lacerations, bleeding or bruising. CT head neg for acute, rib films neg.  Since then has been more sleepy but Pt denies new neurological symptoms such as new headache, or facial or extremity weakness or numbness  Pt continues to have recurring right LBP without change in severity, bowel or bladder change, fever, wt loss,  worsening LE pain/numbness/weakness, gait change or falls, plans to see Dr Ian Bushman [facility] soon.  Also for f/u PSA today - Denies urinary symptoms such as dysuria, frequency, urgency, flank pain, hematuria or n/v, fever, chills, but declines referral to urology, asks for repeat testing instead first.  Pt denies other chest pain, increased sob or doe, wheezing, orthopnea, PND, increased LE swelling, palpitations, dizziness or syncope.  Pt denies polydipsia, polyuria, Past Medical History  Diagnosis Date  . 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 DIABETES MELLITUS, TYPE II 08/29/2006  . HYPERLIPIDEMIA 08/29/2006  . GOUT 04/22/2007  . Overweight 08/29/2006  . ERECTILE DYSFUNCTION 11/26/2006  . DEPRESSION 08/29/2006  . HYPERTENSION 08/29/2006  . CORONARY ARTERY DISEASE 08/29/2006  . 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 DIASTOLIC HEART FAILURE, CHRONIC 06/09/2008  . BENIGN PROSTATIC HYPERTROPHY 08/29/2006  . LUMBAR RADICULOPATHY, RIGHT 06/10/2007  . INSOMNIA-SLEEP DISORDER-UNSPEC 10/23/2007  . PACEMAKER, PERMANENT 03/04/2008    pt denies this date  . Left lumbar radiculopathy 05/30/2010  . Myocardial infarction 12/27/10    "I've had several  MIs"  . Sleep apnea     "if I lay flat I quit breathing; HOB up I'm fine"  . Arthritis     "lower back; going back down both my sciatic nerves"  . Syncope and collapse 12/27/10  . Aortic root dilatation 02/02/2011   Past Surgical History  Procedure Laterality Date  . S/p left arm surgury after work accident  1991    "2000# steel fell on it"  . S/p right hand surgury for foreign object  1970's    "piece of wood went in my hand; had to get that out"  . Insert / replace / remove pacemaker  ~ 2004    initial pacemaker placement  . Insert / replace / remove pacemaker  10/2009    generator change  . Coronary artery bypass graft  1992    CABG X 2  . Coronary angioplasty with stent placement  12/27/10    "I've had a total of 9 cardiac stents put in"    reports that he quit smoking about 42 years ago. His smoking use included Cigarettes. He has a 27 pack-year smoking history. He has quit using smokeless tobacco. He reports that he does not drink alcohol or use illicit drugs. family history includes Coronary artery disease (age of onset: 34) in his other; Diabetes in his mother, other, and sister; Heart disease in his sister and sister. Allergies  Allergen Reactions  . Crestor [Rosuvastatin Calcium] Other (See Comments)    Common to girls period.   Current Outpatient Prescriptions on File Prior to Visit  Medication Sig Dispense Refill  .  allopurinol (ZYLOPRIM) 300 MG tablet take 1 tablet by mouth once daily  90 tablet  1  . aspirin EC 81 MG tablet Take 1 tablet (81 mg total) by mouth daily.      Marland Kitchen atorvastatin (LIPITOR) 80 MG tablet Take 1 tablet (80 mg total) by mouth daily.  90 tablet  1  . carvedilol (COREG) 25 MG tablet Take one half tablet twice daily  180 tablet  1  . cetirizine (ZYRTEC) 10 MG tablet Take 1 tablet (10 mg total) by mouth daily.  30 tablet  11  . cyclobenzaprine (FLEXERIL) 5 MG tablet Take 1 tablet (5 mg total) by mouth 3 (three) times daily as needed. For muscle spasms   270 tablet  3  . docusate sodium (COLACE) 100 MG capsule Take 100 mg by mouth 2 (two) times daily as needed.      . fluticasone (FLONASE) 50 MCG/ACT nasal spray Place 2 sprays into both nostrils daily.  16 g  1  . furosemide (LASIX) 40 MG tablet Take 1 tablet (40 mg total) by mouth daily.  90 tablet  1  . gabapentin (NEURONTIN) 300 MG capsule 2 tabs by mouth three times per day  540 capsule  1  . glipiZIDE (GLUCOTROL XL) 5 MG 24 hr tablet Take 1 tablet (5 mg total) by mouth daily with breakfast.  90 tablet  3  . HYDROcodone-acetaminophen (NORCO) 10-325 MG per tablet Take 1 tablet by mouth every 8 (eight) hours as needed. To fill May 27, 2013  90 tablet  0  . ibuprofen (ADVIL,MOTRIN) 200 MG tablet Take 800 mg by mouth every 6 (six) hours as needed.      . indomethacin (INDOCIN) 50 MG capsule Take 1 capsule (50 mg total) by mouth 2 (two) times daily with a meal.  180 capsule  3  . lisinopril (PRINIVIL,ZESTRIL) 2.5 MG tablet Take 1 tablet (2.5 mg total) by mouth daily.  90 tablet  1  . metFORMIN (GLUCOPHAGE) 500 MG tablet TAKE 4 TABLETS ONCE DAILY  360 tablet  1  . Multiple Vitamin (MULTIVITAMIN) capsule Take 1 capsule by mouth daily.      Glory Rosebush DELICA LANCETS 26S MISC Use as directed three times daily to check blood sugar.  Diagnosis code 250.02  300 each  3  . potassium chloride SA (K-DUR,KLOR-CON) 20 MEQ tablet Take 1 tablet (20 mEq total) by mouth daily.  90 tablet  1  . sitaGLIPtin (JANUVIA) 100 MG tablet Take 1 tablet (100 mg total) by mouth daily.  90 tablet  3   No current facility-administered medications on file prior to visit.   Review of Systems  Constitutional: Negative for unexpected weight change, or unusual diaphoresis  HENT: Negative for tinnitus.   Eyes: Negative for photophobia and visual disturbance.  Respiratory: Negative for choking and stridor.   Gastrointestinal: Negative for vomiting and blood in stool.  Genitourinary: Negative for hematuria and decreased urine  volume.  Musculoskeletal: Negative for acute joint swelling Skin: Negative for color change and wound.  Neurological: Negative for tremors and numbness other than noted  Psychiatric/Behavioral: Negative for decreased concentration or  hyperactivity.       Objective:   Physical Exam BP 110/62  Pulse 64  Temp(Src) 97.5 F (36.4 C) (Oral)  Ht 5\' 11"  (1.803 m)  Wt 226 lb 4 oz (102.626 kg)  BMI 31.57 kg/m2  SpO2 95% VS noted,  Constitutional: Pt appears well-developed and well-nourished.  HENT: Head: NCAT.  Right Ear: External  ear normal.  Left Ear: External ear normal.  Eyes: Conjunctivae and EOM are normal. Pupils are equal, round, and reactive to light.  Neck: Normal range of motion. Neck supple.  Cardiovascular: Normal rate and regular rhythm.   Pulmonary/Chest: Effort normal and breath sounds normal.  Abd:  Soft, NT, non-distended, + BS Neurological: Pt is alert. Not confused , motor 5/5 Skin: Skin is warm. No erythema. Tender slight swollen right occiput, as well as right lateral post chest approx t8.   Spine nontender Psychiatric: Pt behavior is normal. Thought content normal.         Assessment & Plan:

## 2013-04-04 NOTE — Patient Instructions (Addendum)
Please take all new medication as prescribed - the antibiotic Please continue all other medications as before, and refills have been done if requested. Please have the pharmacy call with any other refills you may need.  Please remember to sign up for MyChart if you have not done so, as this will be important to you in the future with finding out test results, communicating by private email, and scheduling acute appointments online when needed.  Please keep your appointments with your specialists as you have planned - Dr Tamala Julian  Please return in 1 months, or sooner if needed

## 2013-04-04 NOTE — Telephone Encounter (Signed)
Could we call him and ask exactly what he has questions about.  I am sorry but out of town so cannot call him directly.  Will try to check EPIC daily though to see if I can answer the question.  Thank you

## 2013-04-04 NOTE — Telephone Encounter (Signed)
Spoke with pt, he states he fell on Sunday and is wanting to come in to discuss having the Epidural Injection.  Pt is scheduled.

## 2013-04-04 NOTE — Telephone Encounter (Signed)
[name] would like to talk to you about previous discussion on injections.

## 2013-04-05 DIAGNOSIS — R079 Chest pain, unspecified: Secondary | ICD-10-CM | POA: Insufficient documentation

## 2013-04-05 NOTE — Assessment & Plan Note (Signed)
S/p fall  - ct head neg, no new neuro changes, reassured,  to f/u any worsening symptoms or concerns

## 2013-04-05 NOTE — Assessment & Plan Note (Signed)
stable overall by history and exam, recent data reviewed with pt, and pt to continue medical treatment as before,  to f/u any worsening symptoms or concerns BP Readings from Last 3 Encounters:  04/04/13 110/62  03/30/13 124/79  03/07/13 130/74

## 2013-04-05 NOTE — Assessment & Plan Note (Signed)
Right chest s/p fall, neg xray films, tender but no other worsening, to f/u any worsening symptoms or concerns

## 2013-04-05 NOTE — Assessment & Plan Note (Addendum)
D/w pt, will tx with 4 wks doxy course, then repeat PSA, but he agrees if repeat is elevated he may need to see urology  Note:  Total time for pt hx, exam, review of record with pt in the room, determination of diagnoses and plan for further eval and tx is > 40 min, with over 50% spent in coordination and counseling of patient

## 2013-04-07 ENCOUNTER — Encounter: Payer: Self-pay | Admitting: Internal Medicine

## 2013-04-07 ENCOUNTER — Ambulatory Visit (INDEPENDENT_AMBULATORY_CARE_PROVIDER_SITE_OTHER): Payer: Commercial Managed Care - HMO | Admitting: *Deleted

## 2013-04-07 DIAGNOSIS — Z950 Presence of cardiac pacemaker: Secondary | ICD-10-CM

## 2013-04-07 DIAGNOSIS — I442 Atrioventricular block, complete: Secondary | ICD-10-CM

## 2013-04-07 LAB — MDC_IDC_ENUM_SESS_TYPE_INCLINIC
Battery Impedance: 226 Ohm
Battery Voltage: 2.79 V
Brady Statistic AP VP Percent: 17 %
Brady Statistic AP VS Percent: 0 %
Brady Statistic AS VP Percent: 83 %
Date Time Interrogation Session: 20150309110537
Lead Channel Impedance Value: 469 Ohm
Lead Channel Pacing Threshold Amplitude: 0.5 V
Lead Channel Pacing Threshold Pulse Width: 0.4 ms
Lead Channel Sensing Intrinsic Amplitude: 5.6 mV
Lead Channel Setting Pacing Amplitude: 2.5 V
MDC IDC MSMT BATTERY REMAINING LONGEVITY: 106 mo
MDC IDC MSMT LEADCHNL RA SENSING INTR AMPL: 4 mV
MDC IDC MSMT LEADCHNL RV IMPEDANCE VALUE: 476 Ohm
MDC IDC MSMT LEADCHNL RV PACING THRESHOLD AMPLITUDE: 0.75 V
MDC IDC MSMT LEADCHNL RV PACING THRESHOLD PULSEWIDTH: 0.4 ms
MDC IDC SET LEADCHNL RA PACING AMPLITUDE: 2 V
MDC IDC SET LEADCHNL RV PACING PULSEWIDTH: 0.4 ms
MDC IDC SET LEADCHNL RV SENSING SENSITIVITY: 2.8 mV
MDC IDC STAT BRADY AS VS PERCENT: 0 %

## 2013-04-07 NOTE — Progress Notes (Signed)
Pacemaker check in clinic. Normal device function. Thresholds, sensing, impedances consistent with previous measurements. Device programmed to maximize longevity. 90 mode switches--- <0.1%, longest 57sec w/ max Atrial rate of 175bpm. 2 NSVT---longest 13 beats. Device programmed at appropriate safety margins. Histogram distribution appropriate for patient activity level. Device programmed to optimize intrinsic conduction. Estimated longevity 34yrs.   ROV w/ Dr. Lovena Le in 50mo.

## 2013-04-08 ENCOUNTER — Telehealth: Payer: Self-pay

## 2013-04-08 NOTE — Telephone Encounter (Signed)
Pharmacy informed.

## 2013-04-08 NOTE — Telephone Encounter (Signed)
Vibramycin is ok (generic ok)

## 2013-04-08 NOTE — Telephone Encounter (Signed)
Right source needs clarification on Doxycycline 100 mg tabsl.  Stated the dosage for is unclear, clarify if Vibramycin caps 100 mg or Doxycycline HYC 100 mg tabs?

## 2013-04-11 ENCOUNTER — Encounter: Payer: Self-pay | Admitting: Family Medicine

## 2013-04-11 ENCOUNTER — Ambulatory Visit (INDEPENDENT_AMBULATORY_CARE_PROVIDER_SITE_OTHER)
Admission: RE | Admit: 2013-04-11 | Discharge: 2013-04-11 | Disposition: A | Discharge status: Home / Self Care | Payer: Medicare HMO | Source: Ambulatory Visit | Attending: Family Medicine | Admitting: Family Medicine

## 2013-04-11 ENCOUNTER — Ambulatory Visit (INDEPENDENT_AMBULATORY_CARE_PROVIDER_SITE_OTHER): Payer: Medicare HMO | Admitting: Family Medicine

## 2013-04-11 VITALS — BP 132/80 | HR 71 | Temp 98.1°F | Resp 18 | Wt 222.0 lb

## 2013-04-11 DIAGNOSIS — M545 Low back pain, unspecified: Secondary | ICD-10-CM

## 2013-04-11 DIAGNOSIS — M5416 Radiculopathy, lumbar region: Secondary | ICD-10-CM

## 2013-04-11 DIAGNOSIS — IMO0002 Reserved for concepts with insufficient information to code with codable children: Secondary | ICD-10-CM

## 2013-04-11 DIAGNOSIS — G8929 Other chronic pain: Secondary | ICD-10-CM

## 2013-04-11 MED ORDER — TRAMADOL HCL 50 MG PO TABS: 50.0000 mg | ORAL_TABLET | Freq: Two times a day (BID) | ORAL | Status: DC | PRN

## 2013-04-11 NOTE — Assessment & Plan Note (Signed)
Patient does have radiculopathy from time to time it has a negative straight leg test today. I am guessing patient does have significant osteoarthritic changes. Patient does have a history of prostate cancer so we will get x-rays today. Depending on the x-rays we may consider further imaging. I do not have a CT scan the patient states he had previously and he does state that he had an epidural steroid injection previously. This was multiple years ago. Depending on findings we'll discuss further treatment options. At this time he was given some other exercises for more range of motion and we'll avoid any strengthening exercises for now. Patient will come back again when his wife is in better health. Prescription for tramadol given will not do pain medications.

## 2013-04-11 NOTE — Progress Notes (Signed)
  [name] [address] [place], [place] [address] Phone: [phone] Subjective:        CC: Back pain  XNA:[id number] [name] is a 68 y.o. male coming in with complaint of back pain. Patient states he has had this pain for multiple years and has been diagnosed with lumbar arthritis and has had sciatica before. Patient states his pain seems to be mostly localized to the left side but does have radiation down the left leg. Patient does have significant past medical history significant for congestive heart failure status post pacemaker and other significant comorbidities which is made imaging difficult. Patient at one point was to have a CT scan of the lumbar spine but this was not accomplished. Patient describes his pain as a constant to severe aching sensation that does have a sharp burning pain going down his leg. Patient is having a hard time concentrating does his wife is in the hospital     Past medical history, social, surgical and family history all reviewed in electronic medical record.   Review of Systems: No headache, visual changes, nausea, vomiting, diarrhea, constipation, dizziness, abdominal pain, skin rash, fevers, chills, night sweats, weight loss, swollen lymph nodes, body aches, joint swelling, muscle aches, chest pain, shortness of breath, mood changes.   Objective Blood pressure 132/80, pulse 71, temperature 98.1 F (36.7 C), temperature source Oral, resp. rate 18, weight 222 lb (100.699 kg), SpO2 97.00%.  General: No apparent distress alert and oriented x3 mood and affect normal, dressed appropriately.  HEENT: Pupils equal, extraocular movements intact  Respiratory: Patient's speak in full sentences and does not appear short of breath  Cardiovascular: No lower extremity edema, non tender, no erythema  Skin: Warm dry intact with no signs of infection or rash on extremities or on axial skeleton.  Abdomen: Soft nontender  Neuro:  Cranial nerves II through XII are intact, neurovascularly intact in all extremities with 2+ DTRs and 2+ pulses.  Lymph: No lymphadenopathy of posterior or anterior cervical chain or axillae bilaterally.  Gait slow broad-based gait mild antalgic  MSK:  Non tender with full range of motion and good stability and symmetric strength and tone of shoulders, elbows, wrist, hip, knee and ankles bilaterally.  Back Exam:  Inspection: Mild increase in thoracic kyphosis Motion: Flexion 25 deg, Extension 35 deg, Side Bending to 35 deg bilaterally,  Rotation to 35 deg bilaterally  SLR laying: Negative  XSLR laying: Negative  Palpable tenderness: Patient does have tenderness of the lumbar spine paraspinal musculature bilaterally no spinous process tenderness.. FABER: negative. Sensory change: Gross sensation intact to all lumbar and sacral dermatomes.  Reflexes: 2+ at both patellar tendons, 2+ at achilles tendons, Babinski's downgoing.  Strength at foot  Plantar-flexion: 5/5 Dorsi-flexion: 5/5 Eversion: 5/5 Inversion: 5/5  Leg strength  Quad: 5/5 Hamstring: 5/5 Hip flexor: 5/5 Hip abductors: 5/5      Impression and Recommendations:     This case required medical decision making of moderate complexity.

## 2013-04-11 NOTE — Patient Instructions (Addendum)
I am sorry about your wife.  Get xrays downstairs when you can.  Stop the exercises for now.  Tramadol if needed and I gave you a prescription.  Come back in 2 weeks or when is convienant for you.  I will call you when I get the xrays.

## 2013-04-11 NOTE — Progress Notes (Signed)
Pre visit review using our clinic review tool, if applicable. No additional management support is needed unless otherwise documented below in the visit note. 

## 2013-04-14 ENCOUNTER — Telehealth: Payer: Self-pay | Admitting: Family Medicine

## 2013-04-14 NOTE — Telephone Encounter (Signed)
Discussed with pt. He states he will call back if he decides to have the epidural injection.

## 2013-04-14 NOTE — Telephone Encounter (Signed)
Patient is calling to request his x-ray results from last week. Please call to discuss.

## 2013-04-18 ENCOUNTER — Telehealth: Payer: Self-pay

## 2013-04-18 DIAGNOSIS — IMO0002 Reserved for concepts with insufficient information to code with codable children: Secondary | ICD-10-CM

## 2013-04-18 NOTE — Telephone Encounter (Signed)
Please call patient in tell him that we can do the epidural steroid injections at [facility] imaging or at the hospital they will schedule for him. Please put order in.

## 2013-04-18 NOTE — Telephone Encounter (Signed)
Discussed with pt, entered order.

## 2013-04-18 NOTE — Telephone Encounter (Signed)
Phone call from patient [phone] he states he would like to go ahead and have the injections you all talked about. He would like to know when and where? Please advise. Thank you.

## 2013-04-23 ENCOUNTER — Other Ambulatory Visit: Payer: Self-pay | Admitting: *Deleted

## 2013-04-23 DIAGNOSIS — M5416 Radiculopathy, lumbar region: Secondary | ICD-10-CM

## 2013-04-28 ENCOUNTER — Telehealth: Payer: Self-pay

## 2013-04-28 NOTE — Telephone Encounter (Signed)
The patient called hoping to get clarification on what procedures he needs done before he can get shots in his back for pain.   Callback - [phone]

## 2013-04-28 NOTE — Telephone Encounter (Signed)
Called patient back and discussed that radiologist would like the CT scan of his back. Patient is scheduled to have the CT scan on April 3 at 10:00 Patient voices understanding and will be there. After this will get him for his epidural steroid injections.

## 2013-05-02 ENCOUNTER — Ambulatory Visit (INDEPENDENT_AMBULATORY_CARE_PROVIDER_SITE_OTHER)
Admission: RE | Admit: 2013-05-02 | Discharge: 2013-05-02 | Disposition: A | Discharge status: Home / Self Care | Payer: Commercial Managed Care - HMO | Source: Ambulatory Visit | Attending: Family Medicine | Admitting: Family Medicine

## 2013-05-02 DIAGNOSIS — IMO0002 Reserved for concepts with insufficient information to code with codable children: Secondary | ICD-10-CM

## 2013-05-02 DIAGNOSIS — M5416 Radiculopathy, lumbar region: Secondary | ICD-10-CM

## 2013-05-07 ENCOUNTER — Encounter: Payer: Self-pay | Admitting: Internal Medicine

## 2013-05-07 ENCOUNTER — Telehealth: Payer: Self-pay | Admitting: *Deleted

## 2013-05-07 ENCOUNTER — Ambulatory Visit (INDEPENDENT_AMBULATORY_CARE_PROVIDER_SITE_OTHER): Payer: Commercial Managed Care - HMO | Admitting: Internal Medicine

## 2013-05-07 ENCOUNTER — Other Ambulatory Visit (INDEPENDENT_AMBULATORY_CARE_PROVIDER_SITE_OTHER): Payer: Commercial Managed Care - HMO

## 2013-05-07 ENCOUNTER — Telehealth: Payer: Self-pay | Admitting: Internal Medicine

## 2013-05-07 VITALS — BP 110/70 | HR 60 | Temp 97.9°F | Wt 222.5 lb

## 2013-05-07 DIAGNOSIS — IMO0002 Reserved for concepts with insufficient information to code with codable children: Secondary | ICD-10-CM

## 2013-05-07 DIAGNOSIS — R972 Elevated prostate specific antigen [PSA]: Secondary | ICD-10-CM

## 2013-05-07 DIAGNOSIS — E1142 Type 2 diabetes mellitus with diabetic polyneuropathy: Secondary | ICD-10-CM

## 2013-05-07 DIAGNOSIS — I10 Essential (primary) hypertension: Secondary | ICD-10-CM

## 2013-05-07 DIAGNOSIS — E1140 Type 2 diabetes mellitus with diabetic neuropathy, unspecified: Secondary | ICD-10-CM

## 2013-05-07 DIAGNOSIS — E1165 Type 2 diabetes mellitus with hyperglycemia: Principal | ICD-10-CM

## 2013-05-07 DIAGNOSIS — E1149 Type 2 diabetes mellitus with other diabetic neurological complication: Secondary | ICD-10-CM

## 2013-05-07 DIAGNOSIS — E785 Hyperlipidemia, unspecified: Secondary | ICD-10-CM

## 2013-05-07 LAB — BASIC METABOLIC PANEL
BUN: 16 mg/dL (ref 6–23)
CHLORIDE: 100 meq/L (ref 96–112)
CO2: 30 mEq/L (ref 19–32)
Calcium: 9.4 mg/dL (ref 8.4–10.5)
Creatinine, Ser: 0.8 mg/dL (ref 0.4–1.5)
GFR: 108.66 mL/min (ref >60.00)
Glucose, Bld: 202 mg/dL — ABNORMAL HIGH (ref 70–99)
POTASSIUM: 5.1 meq/L (ref 3.5–5.1)
Sodium: 137 mEq/L (ref 135–145)

## 2013-05-07 LAB — LIPID PANEL
Cholesterol: 125 mg/dL (ref 0–200)
HDL: 38.1 mg/dL — ABNORMAL LOW (ref >39.00)
LDL CALC: 73 mg/dL (ref 0–99)
TRIGLYCERIDES: 68 mg/dL (ref 0.0–149.0)
Total CHOL/HDL Ratio: 3
VLDL: 13.6 mg/dL (ref 0.0–40.0)

## 2013-05-07 LAB — HEPATIC FUNCTION PANEL
ALT: 35 U/L (ref 0–53)
AST: 27 U/L (ref 0–37)
Albumin: 4.1 g/dL (ref 3.5–5.2)
Alkaline Phosphatase: 75 U/L (ref 39–117)
BILIRUBIN TOTAL: 0.5 mg/dL (ref 0.3–1.2)
Bilirubin, Direct: 0.1 mg/dL (ref 0.0–0.3)
TOTAL PROTEIN: 7.2 g/dL (ref 6.0–8.3)

## 2013-05-07 LAB — HEMOGLOBIN A1C: HEMOGLOBIN A1C: 8 % — AB (ref 4.6–6.5)

## 2013-05-07 LAB — PSA: PSA: 3.58 ng/mL (ref 0.10–4.00)

## 2013-05-07 MED ORDER — TRAMADOL HCL 50 MG PO TABS: 50.0000 mg | ORAL_TABLET | Freq: Two times a day (BID) | ORAL | Status: DC | PRN

## 2013-05-07 NOTE — Progress Notes (Signed)
Pre visit review using our clinic review tool, if applicable. No additional management support is needed unless otherwise documented below in the visit note. 

## 2013-05-07 NOTE — Assessment & Plan Note (Signed)
stable overall by history and exam, recent data reviewed with pt, and pt to continue medical treatment as before,  to f/u any worsening symptoms or concerns Lab Results  Component Value Date   HGBA1C 8.7* 02/18/2013

## 2013-05-07 NOTE — Telephone Encounter (Signed)
Resent rx to correct pharmacy. 

## 2013-05-07 NOTE — Telephone Encounter (Signed)
Pt wants his RX sent to [facility] in [place].

## 2013-05-07 NOTE — Patient Instructions (Signed)
I could not refill the hydrocodone as I found out on the computer record that you have a prescription for the medication dated for apr 28, so you should have plenty of time to find another provider for your chronic pain treatment, as I no longer do this in my practice.  Please call if you change your mind about being referred to a pain clinic  Please continue all other medications as before, and refills have been done if requested. Please have the pharmacy call with any other refills you may need.   Please go to the LAB in the Basement (turn left off the elevator) for the tests to be done today You will be contacted by phone if any changes need to be made immediately.  Otherwise, you will receive a letter about your results with an explanation, but please check with MyChart first.  You will be contacted regarding the referral for: urology in [facility])  Please return in 6 months, or sooner if needed

## 2013-05-07 NOTE — Assessment & Plan Note (Signed)
For f/u psa, and referral urology

## 2013-05-07 NOTE — Assessment & Plan Note (Signed)
stable overall by history and exam, recent data reviewed with pt, and pt to continue medical treatment as before,  to f/u any worsening symptoms or concerns Lab Results  Component Value Date   LDLCALC 94 02/18/2013

## 2013-05-07 NOTE — Progress Notes (Signed)
Subjective:    Patient ID: [name], male    DOB: Mar 03, 1945, 68 y.o.   MRN: [id number]  HPI  Here to f/u; overall doing ok,  Pt denies chest pain, increased sob or doe, wheezing, orthopnea, PND, increased LE swelling, palpitations, dizziness or syncope.  Pt denies polydipsia, polyuria, or low sugar symptoms such as weakness or confusion improved with po intake.  Pt denies new neurological symptoms such as new headache, or facial or extremity weakness or numbness.   Pt states overall good compliance with meds, has been trying to follow lower cholesterol, diabetic diet, with wt overall stable,  but little exercise however.  Has not seen urology since elev PSA jan 2015, has seen [facility] urology in [place].  Finished antibx per pt - due for f/u psa today. Overall pain controlled, needs pain med refilled.  tramadol not working well., but on review has a rx for hydrocodone dated for apr 28. Past Medical History  Diagnosis Date  . 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 DIABETES MELLITUS, TYPE II 08/29/2006  . HYPERLIPIDEMIA 08/29/2006  . GOUT 04/22/2007  . Overweight 08/29/2006  . ERECTILE DYSFUNCTION 11/26/2006  . DEPRESSION 08/29/2006  . HYPERTENSION 08/29/2006  . CORONARY ARTERY DISEASE 08/29/2006  . 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 DIASTOLIC HEART FAILURE, CHRONIC 06/09/2008  . BENIGN PROSTATIC HYPERTROPHY 08/29/2006  . LUMBAR RADICULOPATHY, RIGHT 06/10/2007  . INSOMNIA-SLEEP DISORDER-UNSPEC 10/23/2007  . PACEMAKER, PERMANENT 03/04/2008    pt denies this date  . Left lumbar radiculopathy 05/30/2010  . Myocardial infarction 12/27/10    "I've had several MIs"  . Sleep apnea     "if I lay flat I quit breathing; HOB up I'm fine"  . Arthritis     "lower back; going back down both my sciatic nerves"  . Syncope and collapse 12/27/10  . Aortic root dilatation 02/02/2011   Past Surgical History  Procedure Laterality Date  . S/p left  arm surgury after work accident  1991    "2000# steel fell on it"  . S/p right hand surgury for foreign object  1970's    "piece of wood went in my hand; had to get that out"  . Insert / replace / remove pacemaker  ~ 2004    initial pacemaker placement  . Insert / replace / remove pacemaker  10/2009    generator change  . Coronary artery bypass graft  1992    CABG X 2  . Coronary angioplasty with stent placement  12/27/10    "I've had a total of 9 cardiac stents put in"    reports that he quit smoking about 42 years ago. His smoking use included Cigarettes. He has a 27 pack-year smoking history. He has quit using smokeless tobacco. He reports that he does not drink alcohol or use illicit drugs. family history includes Coronary artery disease (age of onset: 37) in his other; Diabetes in his mother, other, and sister; Heart disease in his sister and sister. Allergies  Allergen Reactions  . Crestor [Rosuvastatin Calcium] Other (See Comments)    Common to girls period.   Current Outpatient Prescriptions on File Prior to Visit  Medication Sig Dispense Refill  . allopurinol (ZYLOPRIM) 300 MG tablet take 1 tablet by mouth once daily  90 tablet  1  . aspirin EC 81 MG tablet Take 1 tablet (81 mg total) by mouth daily.      Marland Kitchen atorvastatin (LIPITOR) 80 MG tablet  Take 1 tablet (80 mg total) by mouth daily.  90 tablet  1  . carvedilol (COREG) 25 MG tablet Take one half tablet twice daily  180 tablet  1  . cyclobenzaprine (FLEXERIL) 5 MG tablet Take 1 tablet (5 mg total) by mouth 3 (three) times daily as needed. For muscle spasms  270 tablet  3  . docusate sodium (COLACE) 100 MG capsule Take 100 mg by mouth 2 (two) times daily as needed.      . fluticasone (FLONASE) 50 MCG/ACT nasal spray Place 2 sprays into both nostrils daily.  16 g  1  . furosemide (LASIX) 40 MG tablet Take 1 tablet (40 mg total) by mouth daily.  90 tablet  1  . gabapentin (NEURONTIN) 300 MG capsule 2 tabs by mouth three times per  day  540 capsule  1  . glipiZIDE (GLUCOTROL XL) 5 MG 24 hr tablet Take 1 tablet (5 mg total) by mouth daily with breakfast.  90 tablet  3  . HYDROcodone-acetaminophen (NORCO) 10-325 MG per tablet Take 1 tablet by mouth every 8 (eight) hours as needed. To fill May 27, 2013  90 tablet  0  . ibuprofen (ADVIL,MOTRIN) 200 MG tablet Take 800 mg by mouth every 6 (six) hours as needed.      . indomethacin (INDOCIN) 50 MG capsule Take 1 capsule (50 mg total) by mouth 2 (two) times daily with a meal.  180 capsule  3  . lisinopril (PRINIVIL,ZESTRIL) 2.5 MG tablet Take 1 tablet (2.5 mg total) by mouth daily.  90 tablet  1  . metFORMIN (GLUCOPHAGE) 500 MG tablet TAKE 4 TABLETS ONCE DAILY  360 tablet  1  . Multiple Vitamin (MULTIVITAMIN) capsule Take 1 capsule by mouth daily.      Glory Rosebush DELICA LANCETS 63O MISC Use as directed three times daily to check blood sugar.  Diagnosis code 250.02  300 each  3  . potassium chloride SA (K-DUR,KLOR-CON) 20 MEQ tablet Take 1 tablet (20 mEq total) by mouth daily.  90 tablet  1  . sitaGLIPtin (JANUVIA) 100 MG tablet Take 1 tablet (100 mg total) by mouth daily.  90 tablet  3  . cetirizine (ZYRTEC) 10 MG tablet Take 1 tablet (10 mg total) by mouth daily.  30 tablet  11   No current facility-administered medications on file prior to visit.    Review of Systems  Constitutional: Negative for unexpected weight change, or unusual diaphoresis  HENT: Negative for tinnitus.   Eyes: Negative for photophobia and visual disturbance.  Respiratory: Negative for choking and stridor.   Gastrointestinal: Negative for vomiting and blood in stool.  Genitourinary: Negative for hematuria and decreased urine volume.  Musculoskeletal: Negative for acute joint swelling Skin: Negative for color change and wound.  Neurological: Negative for tremors and numbness other than noted  Psychiatric/Behavioral: Negative for decreased concentration or  hyperactivity.       Objective:   Physical  Exam BP 110/70  Pulse 60  Temp(Src) 97.9 F (36.6 C) (Oral)  Wt 222 lb 8 oz (100.925 kg)  SpO2 97% VS noted,  Constitutional: Pt appears well-developed and well-nourished.  HENT: Head: NCAT.  Right Ear: External ear normal.  Left Ear: External ear normal.  Eyes: Conjunctivae and EOM are normal. Pupils are equal, round, and reactive to light.  Neck: Normal range of motion. Neck supple.  Cardiovascular: Normal rate and regular rhythm.   Pulmonary/Chest: Effort normal and breath sounds normal.  Abd:  Soft, NT, non-distended, +  BS Neurological: Pt is alert. Not confused  Skin: Skin is warm. No erythema.  Psychiatric: Pt behavior is normal. Thought content normal.      Assessment & Plan:

## 2013-05-07 NOTE — Telephone Encounter (Signed)
Refill done.  

## 2013-05-07 NOTE — Assessment & Plan Note (Signed)
stable overall by history and exam, recent data reviewed with pt, and pt to continue medical treatment as before,  to f/u any worsening symptoms or concerns BP Readings from Last 3 Encounters:  05/07/13 110/70  04/11/13 132/80  04/04/13 110/62

## 2013-05-12 ENCOUNTER — Ambulatory Visit
Admission: RE | Admit: 2013-05-12 | Discharge: 2013-05-12 | Disposition: A | Discharge status: Home / Self Care | Payer: Commercial Managed Care - HMO | Source: Ambulatory Visit | Attending: Family Medicine | Admitting: Family Medicine

## 2013-05-12 ENCOUNTER — Other Ambulatory Visit: Payer: Self-pay | Admitting: Family Medicine

## 2013-05-12 DIAGNOSIS — IMO0002 Reserved for concepts with insufficient information to code with codable children: Secondary | ICD-10-CM

## 2013-05-12 MED ORDER — IOHEXOL 180 MG/ML  SOLN
1.0000 mL | Freq: Once | INTRAMUSCULAR | Status: AC | PRN
  Administered 2013-05-12: 1 mL via INTRAVENOUS

## 2013-05-12 MED ORDER — METHYLPREDNISOLONE ACETATE 40 MG/ML INJ SUSP (RADIOLOG
120.0000 mg | Freq: Once | INTRAMUSCULAR | Status: AC
  Administered 2013-05-12: 120 mg via EPIDURAL

## 2013-05-12 NOTE — Discharge Instructions (Signed)

## 2013-05-22 ENCOUNTER — Telehealth: Payer: Self-pay | Admitting: Family Medicine

## 2013-05-22 MED ORDER — TRAMADOL HCL 50 MG PO TABS: 50.0000 mg | ORAL_TABLET | Freq: Two times a day (BID) | ORAL | Status: DC | PRN

## 2013-05-22 NOTE — Telephone Encounter (Signed)
Requesting a refill on Tramadol.  Call when ready to pick up.

## 2013-05-22 NOTE — Telephone Encounter (Signed)
Refill done.  

## 2013-06-04 ENCOUNTER — Encounter: Payer: Self-pay | Admitting: Internal Medicine

## 2013-06-06 ENCOUNTER — Other Ambulatory Visit: Payer: Self-pay

## 2013-06-06 MED ORDER — FLUTICASONE PROPIONATE 50 MCG/ACT NA SUSP: 2.0000 | Freq: Every day | NASAL | Status: DC

## 2013-06-06 MED ORDER — FUROSEMIDE 40 MG PO TABS: 40.0000 mg | ORAL_TABLET | Freq: Every day | ORAL | Status: DC

## 2013-06-10 ENCOUNTER — Telehealth: Payer: Self-pay

## 2013-06-10 MED ORDER — METFORMIN HCL 500 MG PO TABS: ORAL_TABLET | ORAL | Status: DC

## 2013-06-10 MED ORDER — FUROSEMIDE 40 MG PO TABS: 40.0000 mg | ORAL_TABLET | Freq: Every day | ORAL | Status: DC

## 2013-06-10 MED ORDER — ALLOPURINOL 300 MG PO TABS: ORAL_TABLET | ORAL | Status: DC

## 2013-06-10 MED ORDER — LISINOPRIL 2.5 MG PO TABS: 2.5000 mg | ORAL_TABLET | Freq: Every day | ORAL | Status: DC

## 2013-06-10 MED ORDER — ATORVASTATIN CALCIUM 80 MG PO TABS: 80.0000 mg | ORAL_TABLET | Freq: Every day | ORAL | Status: DC

## 2013-06-10 NOTE — Telephone Encounter (Signed)
Mail order refill done 

## 2013-06-27 ENCOUNTER — Telehealth: Payer: Self-pay

## 2013-06-27 DIAGNOSIS — C61 Malignant neoplasm of prostate: Secondary | ICD-10-CM

## 2013-06-27 NOTE — Telephone Encounter (Signed)
The patient would like a referral to [facility]. His previous urologist is now out of network

## 2013-06-27 NOTE — Telephone Encounter (Signed)
referral done.

## 2013-07-09 ENCOUNTER — Ambulatory Visit (INDEPENDENT_AMBULATORY_CARE_PROVIDER_SITE_OTHER): Payer: Commercial Managed Care - HMO | Admitting: Family Medicine

## 2013-07-09 ENCOUNTER — Encounter: Payer: Self-pay | Admitting: Family Medicine

## 2013-07-09 VITALS — BP 124/72 | HR 79 | Ht 71.0 in | Wt 221.0 lb

## 2013-07-09 DIAGNOSIS — IMO0002 Reserved for concepts with insufficient information to code with codable children: Secondary | ICD-10-CM | POA: Diagnosis not present

## 2013-07-09 DIAGNOSIS — M549 Dorsalgia, unspecified: Secondary | ICD-10-CM | POA: Diagnosis not present

## 2013-07-09 MED ORDER — TRAMADOL HCL 50 MG PO TABS: 100.0000 mg | ORAL_TABLET | Freq: Two times a day (BID) | ORAL | Status: DC | PRN

## 2013-07-09 MED ORDER — METHYLPREDNISOLONE ACETATE 80 MG/ML IJ SUSP
80.0000 mg | Freq: Once | INTRAMUSCULAR | Status: AC
  Administered 2013-07-09: 80 mg via INTRAMUSCULAR

## 2013-07-09 MED ORDER — KETOROLAC TROMETHAMINE 60 MG/2ML IM SOLN
60.0000 mg | Freq: Once | INTRAMUSCULAR | Status: AC
Start: 2013-07-09 — End: 2013-07-09
  Administered 2013-07-09: 60 mg via INTRAMUSCULAR

## 2013-07-09 NOTE — Patient Instructions (Signed)
It is good to see you as always.  Can take up to 2 tramadol at one time but only if you are staying at home We will try 2 injections today to help with the pain.  Call me in 2 weeks and if still in pain we can try another epidural Injections.

## 2013-07-09 NOTE — Progress Notes (Signed)
  [name] [address], [place] [address] Phone: [phone] Subjective:       CC: Back pain follow up  [name] is a 68 y.o. male coming in with complaint of back pain. Patient states he has had this pain for multiple years and has been diagnosed with lumbar arthritis and has had sciatica before. Patient was having severe pain back in April of 2015 and we did get an MRI. Patient's MRI showed the patient did have spinal stenosis at multiple levels of the lumbar spine. Patient was given an epidural steroid injection May 12, 2013. Patient was doing remarkably better for some time. Patient states over the course last week his back pain is gone worse. Patient states it is almost as bad as it was prior to the epidural steroid injection. Patient elected not do the epidural steroid injection until he is having severe pain. Denies any significant radiculopathy at this time. Patient states that he can feel it starting to come back. Patient states that 2 tramadol at one time does seem to be helpful but does make him somewhat drowsy. No new symptoms.    Past medical history, social, surgical and family history all reviewed in electronic medical record.   Review of Systems: No headache, visual changes, nausea, vomiting, diarrhea, constipation, dizziness, abdominal pain, skin rash, fevers, chills, night sweats, weight loss, swollen lymph nodes, body aches, joint swelling, muscle aches, chest pain, shortness of breath, mood changes.   Objective Blood pressure 124/72, pulse 79, height 5\' 11"  (1.803 m), weight 221 lb (100.245 kg), SpO2 95.00%.  General: No apparent distress alert and oriented x3 mood and affect normal, dressed appropriately.  HEENT: Pupils equal, extraocular movements intact  Respiratory: Patient's speak in full sentences and does not appear short of breath  Cardiovascular: No lower extremity edema, non tender, no erythema    Skin: Warm dry intact with no signs of infection or rash on extremities or on axial skeleton.  Abdomen: Soft nontender  Neuro: Cranial nerves II through XII are intact, neurovascularly intact in all extremities with 2+ DTRs and 2+ pulses.  Lymph: No lymphadenopathy of posterior or anterior cervical chain or axillae bilaterally.  Gait slow broad-based gait mild antalgic no change MSK:  Non tender with full range of motion and good stability and symmetric strength and tone of shoulders, elbows, wrist, hip, knee and ankles bilaterally.  Back Exam:  Inspection: Mild increase in thoracic kyphosis Motion: Flexion 25 deg, Extension 35 deg, Side Bending to 35 deg bilaterally,  Rotation to 35 deg bilaterally  SLR laying: Negative  XSLR laying: Negative  Palpable tenderness: Patient does have tenderness of the lumbar spine paraspinal musculature bilaterally no spinous process tenderness. No significant change from previous exam FABER: negative. Sensory change: Gross sensation intact to all lumbar and sacral dermatomes.  Reflexes: 2+ at both patellar tendons, 2+ at achilles tendons, Babinski's downgoing.  Strength at foot  Plantar-flexion: 5/5 Dorsi-flexion: 5/5 Eversion: 5/5 Inversion: 5/5  Leg strength  Quad: 4/5 Hamstring: 4/5 Hip flexor: 4/5 Hip abductors: 4/5      Impression and Recommendations:     This case required medical decision making of moderate complexity.

## 2013-07-09 NOTE — Assessment & Plan Note (Signed)
Patient back pain is coming back again. Patient does have severe spinal stenosis but is not having any significant radicular symptoms at this time. Patient was given Toradol as well as Depo-Medrol today. We discussed doing home exercise program and patient was given a handout again. Patient has not been doing them a regular basis. We discussed icing protocol they can be helpful. Patient is no better in 2 weeks and we will tendon in order for patient to have another epidural steroid injection. We did go over and do some medical reconsideration as well as increase patient's tramadol and patient was given a prescription today.  Spent greater than 25 minutes with patient face-to-face and had greater than 50% of counseling including as described above in assessment and plan.

## 2013-08-26 ENCOUNTER — Ambulatory Visit (INDEPENDENT_AMBULATORY_CARE_PROVIDER_SITE_OTHER): Payer: Commercial Managed Care - HMO | Admitting: Cardiology

## 2013-08-26 ENCOUNTER — Encounter: Payer: Self-pay | Admitting: *Deleted

## 2013-08-26 ENCOUNTER — Encounter: Payer: Self-pay | Admitting: Cardiology

## 2013-08-26 VITALS — BP 141/87 | HR 65 | Ht 71.0 in | Wt 223.5 lb

## 2013-08-26 DIAGNOSIS — I4891 Unspecified atrial fibrillation: Secondary | ICD-10-CM | POA: Insufficient documentation

## 2013-08-26 DIAGNOSIS — I10 Essential (primary) hypertension: Secondary | ICD-10-CM

## 2013-08-26 DIAGNOSIS — I483 Typical atrial flutter: Secondary | ICD-10-CM

## 2013-08-26 DIAGNOSIS — E785 Hyperlipidemia, unspecified: Secondary | ICD-10-CM

## 2013-08-26 DIAGNOSIS — I5032 Chronic diastolic (congestive) heart failure: Secondary | ICD-10-CM

## 2013-08-26 DIAGNOSIS — I4892 Unspecified atrial flutter: Secondary | ICD-10-CM

## 2013-08-26 MED ORDER — APIXABAN 5 MG PO TABS: 5.0000 mg | ORAL_TABLET | Freq: Two times a day (BID) | ORAL | Status: DC

## 2013-08-26 NOTE — Assessment & Plan Note (Signed)
Patient with newly diagnosed atrial flutter. He is asymptomatic. Continue carvedilol for rate control. Embolic risk factors include age greater than 76, coronary artery disease, history of congestive heart failure, hypertension and diabetes mellitus. Discontinue aspirin and Indocin. Begin apixaban 5 mg by mouth twice a day. Check echocardiogram. In 4 weeks check CBC, renal function and TSH. I will refer to Dr. Lovena Le for consideration of ablation. This would hopefully avoid long-term anticoagulation.

## 2013-08-26 NOTE — Assessment & Plan Note (Signed)
Discontinue aspirin given need for anticoagulation. Continue statin. 

## 2013-08-26 NOTE — Assessment & Plan Note (Signed)
Management per electrophysiology. 
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
## 2013-08-26 NOTE — Patient Instructions (Signed)
Your physician recommends that you schedule a follow-up appointment in: 3 MONTHS WITH DR CRENSHAW  STOP ASPIRIN  STOP INDOCIN  START ELIQUIS 5 MG ONE TABLET TWICE DAILY  Your physician recommends that you return for lab work in: [place] has requested that you have an echocardiogram. Echocardiography is a painless test that uses sound waves to create images of your heart. It provides your doctor with information about the size and shape of your heart and how well your heart's chambers and valves are working. This procedure takes approximately one hour. There are no restrictions for this procedure.   REFERRAL TO DR Clarence

## 2013-08-26 NOTE — Assessment & Plan Note (Signed)
Continue statin. 

## 2013-08-26 NOTE — Assessment & Plan Note (Signed)
Continue present dose of Lasix. 

## 2013-08-26 NOTE — Assessment & Plan Note (Signed)
Continue present blood pressure medications. 

## 2013-09-03 ENCOUNTER — Encounter (HOSPITAL_COMMUNITY): Payer: Self-pay | Admitting: Emergency Medicine

## 2013-09-03 ENCOUNTER — Emergency Department (HOSPITAL_COMMUNITY)
Admission: EM | Admit: 2013-09-03 | Discharge: 2013-09-03 | Disposition: A | Discharge status: Home / Self Care | Payer: No Typology Code available for payment source | Attending: Emergency Medicine | Admitting: Emergency Medicine

## 2013-09-03 ENCOUNTER — Emergency Department (HOSPITAL_COMMUNITY): Payer: No Typology Code available for payment source

## 2013-09-03 DIAGNOSIS — Z8739 Personal history of other diseases of the musculoskeletal system and connective tissue: Secondary | ICD-10-CM | POA: Diagnosis not present

## 2013-09-03 DIAGNOSIS — I10 Essential (primary) hypertension: Secondary | ICD-10-CM | POA: Insufficient documentation

## 2013-09-03 DIAGNOSIS — Z8546 Personal history of malignant neoplasm of prostate: Secondary | ICD-10-CM | POA: Insufficient documentation

## 2013-09-03 DIAGNOSIS — I5032 Chronic diastolic (congestive) heart failure: Secondary | ICD-10-CM | POA: Diagnosis not present

## 2013-09-03 DIAGNOSIS — N529 Male erectile dysfunction, unspecified: Secondary | ICD-10-CM | POA: Insufficient documentation

## 2013-09-03 DIAGNOSIS — Z951 Presence of aortocoronary bypass graft: Secondary | ICD-10-CM | POA: Diagnosis not present

## 2013-09-03 DIAGNOSIS — Z87891 Personal history of nicotine dependence: Secondary | ICD-10-CM | POA: Insufficient documentation

## 2013-09-03 DIAGNOSIS — Y92410 Unspecified street and highway as the place of occurrence of the external cause: Secondary | ICD-10-CM | POA: Diagnosis not present

## 2013-09-03 DIAGNOSIS — E785 Hyperlipidemia, unspecified: Secondary | ICD-10-CM | POA: Insufficient documentation

## 2013-09-03 DIAGNOSIS — Z9861 Coronary angioplasty status: Secondary | ICD-10-CM | POA: Insufficient documentation

## 2013-09-03 DIAGNOSIS — N400 Enlarged prostate without lower urinary tract symptoms: Secondary | ICD-10-CM | POA: Insufficient documentation

## 2013-09-03 DIAGNOSIS — IMO0002 Reserved for concepts with insufficient information to code with codable children: Secondary | ICD-10-CM | POA: Insufficient documentation

## 2013-09-03 DIAGNOSIS — I442 Atrioventricular block, complete: Secondary | ICD-10-CM | POA: Insufficient documentation

## 2013-09-03 DIAGNOSIS — D689 Coagulation defect, unspecified: Secondary | ICD-10-CM | POA: Insufficient documentation

## 2013-09-03 DIAGNOSIS — I252 Old myocardial infarction: Secondary | ICD-10-CM | POA: Insufficient documentation

## 2013-09-03 DIAGNOSIS — Y9389 Activity, other specified: Secondary | ICD-10-CM | POA: Insufficient documentation

## 2013-09-03 DIAGNOSIS — Z79899 Other long term (current) drug therapy: Secondary | ICD-10-CM | POA: Insufficient documentation

## 2013-09-03 DIAGNOSIS — I2510 Atherosclerotic heart disease of native coronary artery without angina pectoris: Secondary | ICD-10-CM | POA: Diagnosis not present

## 2013-09-03 DIAGNOSIS — E119 Type 2 diabetes mellitus without complications: Secondary | ICD-10-CM | POA: Diagnosis not present

## 2013-09-03 DIAGNOSIS — Z7901 Long term (current) use of anticoagulants: Secondary | ICD-10-CM

## 2013-09-03 DIAGNOSIS — Z950 Presence of cardiac pacemaker: Secondary | ICD-10-CM | POA: Insufficient documentation

## 2013-09-03 DIAGNOSIS — M545 Low back pain, unspecified: Secondary | ICD-10-CM

## 2013-09-03 NOTE — ED Notes (Signed)
Pt alert & oriented x4, stable gait. Patient given discharge instructions, paperwork & prescription(s). Patient  instructed to stop at the registration desk to finish any additional paperwork. Patient verbalized understanding. Pt left department w/ no further questions. 
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
## 2013-09-03 NOTE — ED Notes (Signed)
Patient placed in gown. Instructed nothing to drink or eat at this time.
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
## 2013-09-08 ENCOUNTER — Encounter: Payer: Self-pay | Admitting: Family Medicine

## 2013-09-08 ENCOUNTER — Ambulatory Visit (INDEPENDENT_AMBULATORY_CARE_PROVIDER_SITE_OTHER): Payer: Commercial Managed Care - HMO | Admitting: Family Medicine

## 2013-09-08 VITALS — BP 122/75 | HR 73 | Ht 71.0 in | Wt 224.0 lb

## 2013-09-08 DIAGNOSIS — M5431 Sciatica, right side: Secondary | ICD-10-CM

## 2013-09-08 DIAGNOSIS — M5430 Sciatica, unspecified side: Secondary | ICD-10-CM

## 2013-09-08 MED ORDER — PREDNISONE 50 MG PO TABS: 50.0000 mg | ORAL_TABLET | Freq: Every day | ORAL | Status: DC

## 2013-09-08 MED ORDER — KETOROLAC TROMETHAMINE 60 MG/2ML IM SOLN
60.0000 mg | Freq: Once | INTRAMUSCULAR | Status: AC
Start: 2013-09-08 — End: 2013-09-08
  Administered 2013-09-08: 60 mg via INTRAMUSCULAR
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
## 2013-09-08 NOTE — Addendum Note (Signed)
Addended by: Douglass Rivers T on: 09/08/2013 09:29 AM   Modules accepted: Orders
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
## 2013-09-10 ENCOUNTER — Ambulatory Visit (HOSPITAL_COMMUNITY)
Admission: RE | Admit: 2013-09-10 | Discharge: 2013-09-10 | Disposition: A | Discharge status: Home / Self Care | Payer: Medicare HMO | Source: Ambulatory Visit | Attending: Cardiovascular Disease | Admitting: Cardiovascular Disease

## 2013-09-10 DIAGNOSIS — I483 Typical atrial flutter: Secondary | ICD-10-CM

## 2013-09-10 DIAGNOSIS — I4892 Unspecified atrial flutter: Secondary | ICD-10-CM | POA: Insufficient documentation

## 2013-09-10 DIAGNOSIS — I252 Old myocardial infarction: Secondary | ICD-10-CM | POA: Insufficient documentation

## 2013-09-10 DIAGNOSIS — I10 Essential (primary) hypertension: Secondary | ICD-10-CM | POA: Insufficient documentation

## 2013-09-10 DIAGNOSIS — I5032 Chronic diastolic (congestive) heart failure: Secondary | ICD-10-CM

## 2013-09-10 DIAGNOSIS — I2510 Atherosclerotic heart disease of native coronary artery without angina pectoris: Secondary | ICD-10-CM | POA: Insufficient documentation

## 2013-09-10 DIAGNOSIS — I379 Nonrheumatic pulmonary valve disorder, unspecified: Secondary | ICD-10-CM

## 2013-09-10 NOTE — Progress Notes (Signed)
2D Echo Performed 09/10/2013    [name], RCS

## 2013-09-15 ENCOUNTER — Telehealth: Payer: Self-pay | Admitting: Internal Medicine

## 2013-09-15 DIAGNOSIS — M5431 Sciatica, right side: Secondary | ICD-10-CM

## 2013-09-15 MED ORDER — GLUCOSE BLOOD VI STRP: ORAL_STRIP | Status: DC

## 2013-09-15 NOTE — Telephone Encounter (Signed)
Notified pt strips sent to [facility]...[name]

## 2013-09-15 NOTE — Telephone Encounter (Signed)
Spoke to pt, he states he has having pain of lumbar on right side. Pt would like to have another epidural injection. Order entered.

## 2013-09-15 NOTE — Addendum Note (Signed)
Addended by: Douglass Rivers T on: 09/15/2013 04:58 PM   Modules accepted: Orders

## 2013-09-15 NOTE — Telephone Encounter (Signed)
Patient called requesting one touch ultra test strips be sent to [facility] pharmacy.  He also wants Dr. Tamala Julian to know that his leg is getting worse. It was hurting so badly that he has to "drag it around." Please advise.

## 2013-09-19 ENCOUNTER — Ambulatory Visit (INDEPENDENT_AMBULATORY_CARE_PROVIDER_SITE_OTHER): Payer: Commercial Managed Care - HMO | Admitting: Internal Medicine

## 2013-09-19 ENCOUNTER — Ambulatory Visit (INDEPENDENT_AMBULATORY_CARE_PROVIDER_SITE_OTHER)
Admission: RE | Admit: 2013-09-19 | Discharge: 2013-09-19 | Disposition: A | Discharge status: Home / Self Care | Payer: Commercial Managed Care - HMO | Source: Ambulatory Visit | Attending: Family Medicine | Admitting: Family Medicine

## 2013-09-19 ENCOUNTER — Encounter: Payer: Self-pay | Admitting: Family Medicine

## 2013-09-19 ENCOUNTER — Ambulatory Visit (INDEPENDENT_AMBULATORY_CARE_PROVIDER_SITE_OTHER): Payer: Commercial Managed Care - HMO | Admitting: Family Medicine

## 2013-09-19 VITALS — BP 122/78 | HR 85 | Temp 97.3°F | Ht 71.0 in | Wt 221.0 lb

## 2013-09-19 DIAGNOSIS — M25551 Pain in right hip: Secondary | ICD-10-CM

## 2013-09-19 DIAGNOSIS — M25559 Pain in unspecified hip: Secondary | ICD-10-CM

## 2013-09-19 DIAGNOSIS — IMO0002 Reserved for concepts with insufficient information to code with codable children: Secondary | ICD-10-CM
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
## 2013-09-19 NOTE — Patient Instructions (Signed)
Good to see you I am sorry you are still in pain.  Ice is still your friend Continue all the medicines We will do another epidural at this time.  They will call you or [phone] if you want.  Get xrays downstairs to make sure everything is fine.  Come back 1-2 weeks after epidural and we will see how you are doing.

## 2013-09-24 ENCOUNTER — Telehealth: Payer: Self-pay | Admitting: *Deleted

## 2013-09-24 NOTE — Telephone Encounter (Signed)
Returned to [facility] imaging that pt is clear to hold eliquis 2 days prior to procedure and resume day after.

## 2013-09-29 ENCOUNTER — Ambulatory Visit
Admission: RE | Admit: 2013-09-29 | Discharge: 2013-09-29 | Disposition: A | Discharge status: Home / Self Care | Payer: Commercial Managed Care - HMO | Source: Ambulatory Visit | Attending: Family Medicine | Admitting: Family Medicine

## 2013-09-29 VITALS — BP 135/93 | HR 85

## 2013-09-29 DIAGNOSIS — M5431 Sciatica, right side: Secondary | ICD-10-CM

## 2013-09-29 MED ORDER — METHYLPREDNISOLONE ACETATE 40 MG/ML INJ SUSP (RADIOLOG
120.0000 mg | Freq: Once | INTRAMUSCULAR | Status: AC
  Administered 2013-09-29: 120 mg via EPIDURAL

## 2013-09-29 MED ORDER — IOHEXOL 180 MG/ML  SOLN
1.0000 mL | Freq: Once | INTRAMUSCULAR | Status: AC | PRN
  Administered 2013-09-29: 1 mL via EPIDURAL

## 2013-09-29 NOTE — Discharge Instructions (Signed)
Post Procedure Spinal Discharge Instruction Sheet  1. You may resume a regular diet and any medications that you routinely take (including pain medications).  2. No driving day of procedure.  3. Light activity throughout the rest of the day.  Do not do any strenuous work, exercise, bending or lifting.  The day following the procedure, you can resume normal physical activity but you should refrain from exercising or physical therapy for at least three days thereafter.   Common Side Effects:   Headaches- take your usual medications as directed by your physician.  Increase your fluid intake.  Caffeinated beverages may be helpful.  Lie flat in bed until your headache resolves.   Restlessness or inability to sleep- you may have trouble sleeping for the next few days.  Ask your referring physician if you need any medication for sleep.   Facial flushing or redness- should subside within a few days.   Increased pain- a temporary increase in pain a day or two following your procedure is not unusual.  Take your pain medication as prescribed by your referring physician.   Leg cramps  Please contact our office at [phone] for the following symptoms:  Fever greater than 100 degrees.  Headaches unresolved with medication after 2-3 days.  Increased swelling, pain, or redness at injection site.  Thank you for visiting our office.  May resume Eloquis today.

## 2013-10-07 ENCOUNTER — Other Ambulatory Visit: Payer: Self-pay

## 2013-10-07 ENCOUNTER — Telehealth: Payer: Self-pay

## 2013-10-07 NOTE — Telephone Encounter (Signed)
Since this is for gout, change would not be needed at this time, thanks

## 2013-10-07 NOTE — Telephone Encounter (Signed)
Fax from pharmacy regarding a lower cost alternative medication.  Current prescription indomethacin, alternative meloxicam.  Advise

## 2013-10-08 NOTE — Telephone Encounter (Signed)
Pharmacy informed of MD instructions 

## 2013-10-09 ENCOUNTER — Encounter: Payer: Self-pay | Admitting: Internal Medicine

## 2013-10-09 ENCOUNTER — Ambulatory Visit (INDEPENDENT_AMBULATORY_CARE_PROVIDER_SITE_OTHER): Payer: Commercial Managed Care - HMO | Admitting: Internal Medicine

## 2013-10-09 VITALS — BP 110/72 | HR 76 | Ht 71.0 in | Wt 217.6 lb

## 2013-10-09 DIAGNOSIS — I484 Atypical atrial flutter: Secondary | ICD-10-CM

## 2013-10-09 DIAGNOSIS — Z950 Presence of cardiac pacemaker: Secondary | ICD-10-CM

## 2013-10-09 DIAGNOSIS — I4892 Unspecified atrial flutter: Secondary | ICD-10-CM

## 2013-10-09 DIAGNOSIS — I442 Atrioventricular block, complete: Secondary | ICD-10-CM

## 2013-10-09 NOTE — Assessment & Plan Note (Signed)
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 Will recheck in several months. 

## 2013-10-09 NOTE — Assessment & Plan Note (Addendum)
He remains out of rhythm. His ventricular rate is controlled. No change in meds.
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
## 2013-10-09 NOTE — Patient Instructions (Signed)
Your physician wants you to follow-up in: 6 months in [place] with [facility] and 12 months with Dr Lovena Le in [place] will receive a reminder letter in the mail two months in advance. If you don't receive a letter, please call our office to schedule the follow-up appointment.

## 2013-10-10 LAB — MDC_IDC_ENUM_SESS_TYPE_INCLINIC
Battery Remaining Longevity: 102 mo
Battery Voltage: 2.78 V
Brady Statistic AS VS Percent: 1 %
Lead Channel Impedance Value: 488 Ohm
Lead Channel Pacing Threshold Amplitude: 0.75 V
Lead Channel Pacing Threshold Pulse Width: 0.4 ms
Lead Channel Sensing Intrinsic Amplitude: 4 mV
Lead Channel Setting Sensing Sensitivity: 2.8 mV
MDC IDC MSMT BATTERY IMPEDANCE: 250 Ohm
MDC IDC MSMT LEADCHNL RA IMPEDANCE VALUE: 497 Ohm
MDC IDC SESS DTM: 20150910141935
MDC IDC SET LEADCHNL RA PACING AMPLITUDE: 2 V
MDC IDC SET LEADCHNL RV PACING AMPLITUDE: 2.5 V
MDC IDC SET LEADCHNL RV PACING PULSEWIDTH: 0.4 ms
MDC IDC STAT BRADY AP VP PERCENT: 19 %
MDC IDC STAT BRADY AP VS PERCENT: 0 %
MDC IDC STAT BRADY AS VP PERCENT: 81 %

## 2013-10-13 ENCOUNTER — Encounter: Payer: Self-pay | Admitting: Internal Medicine

## 2013-10-15 ENCOUNTER — Telehealth: Payer: Self-pay | Admitting: Internal Medicine

## 2013-10-15 ENCOUNTER — Ambulatory Visit (INDEPENDENT_AMBULATORY_CARE_PROVIDER_SITE_OTHER): Payer: Commercial Managed Care - HMO | Admitting: Family Medicine

## 2013-10-15 ENCOUNTER — Encounter: Payer: Self-pay | Admitting: Family Medicine

## 2013-10-15 VITALS — BP 118/64 | HR 82 | Ht 71.0 in | Wt 217.0 lb

## 2013-10-15 DIAGNOSIS — M5416 Radiculopathy, lumbar region: Secondary | ICD-10-CM

## 2013-10-15 DIAGNOSIS — IMO0002 Reserved for concepts with insufficient information to code with codable children: Secondary | ICD-10-CM

## 2013-10-15 MED ORDER — GABAPENTIN 300 MG PO CAPS: ORAL_CAPSULE | ORAL | Status: DC
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
## 2013-10-15 NOTE — Telephone Encounter (Signed)
Pt is currently here for visit and requesting Rx refills to be processed for GABAPENTIN and HYDROCODENE. Please contact pt when request is reviewed.

## 2013-10-15 NOTE — Telephone Encounter (Signed)
neurontin done erx to [facility] mail order  I no longer prescribe the hydrocodone for chronic pain management  (pt was given letter about this in dec 2014, as well as his AVS at [facility] in April 2015)

## 2013-10-15 NOTE — Patient Instructions (Signed)
Good to see you The injections helped for 2 days which allows me to know this is from your back.  Continue to be active and physical therapy will be calling you.  Ice is your friend at the end of activity Continue all medications Check in with me again in 4 weeks.
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
## 2013-10-16 MED ORDER — GABAPENTIN 300 MG PO CAPS: ORAL_CAPSULE | ORAL | Status: DC

## 2013-10-16 NOTE — Telephone Encounter (Signed)
Patient informed of MD instructions.  The patient is currently out of neurontin and can he have a small amount sent to the local pharm. Until mail order for neurontin arrives.

## 2013-10-16 NOTE — Telephone Encounter (Signed)
Patient informed sent to local

## 2013-10-16 NOTE — Telephone Encounter (Signed)
#  [phone] sent to [facility] erx

## 2013-10-29 ENCOUNTER — Ambulatory Visit (INDEPENDENT_AMBULATORY_CARE_PROVIDER_SITE_OTHER): Payer: Commercial Managed Care - HMO | Admitting: Internal Medicine

## 2013-10-29 ENCOUNTER — Encounter: Payer: Self-pay | Admitting: Internal Medicine

## 2013-10-29 VITALS — BP 108/76 | HR 87 | Temp 98.1°F | Wt 218.4 lb

## 2013-10-29 DIAGNOSIS — E1149 Type 2 diabetes mellitus with other diabetic neurological complication: Secondary | ICD-10-CM

## 2013-10-29 DIAGNOSIS — R634 Abnormal weight loss: Secondary | ICD-10-CM | POA: Insufficient documentation

## 2013-10-29 DIAGNOSIS — I10 Essential (primary) hypertension: Secondary | ICD-10-CM

## 2013-10-29 DIAGNOSIS — R6881 Early satiety: Secondary | ICD-10-CM

## 2013-10-29 DIAGNOSIS — E1142 Type 2 diabetes mellitus with diabetic polyneuropathy: Secondary | ICD-10-CM

## 2013-10-29 DIAGNOSIS — IMO0002 Reserved for concepts with insufficient information to code with codable children: Secondary | ICD-10-CM

## 2013-10-29 DIAGNOSIS — E785 Hyperlipidemia, unspecified: Secondary | ICD-10-CM

## 2013-10-29 DIAGNOSIS — E1140 Type 2 diabetes mellitus with diabetic neuropathy, unspecified: Secondary | ICD-10-CM

## 2013-10-29 DIAGNOSIS — E1165 Type 2 diabetes mellitus with hyperglycemia: Secondary | ICD-10-CM

## 2013-10-29 MED ORDER — POTASSIUM CHLORIDE CRYS ER 20 MEQ PO TBCR: 20.0000 meq | EXTENDED_RELEASE_TABLET | Freq: Every evening | ORAL | Status: DC

## 2013-10-29 NOTE — Patient Instructions (Addendum)
Please continue all other medications as before, and refills have been done if requested.  Please have the pharmacy call with any other refills you may need.  Please continue your efforts at being more active, low cholesterol diet, and weight control.  Please keep your appointments with your specialists as you may have planned  You will be contacted regarding the referral for: Gastroenterology  Please go to the LAB in the Basement (turn left off the elevator) for the tests to be done tomorrow  You will be contacted by phone if any changes need to be made immediately.  Otherwise, you will receive a letter about your results with an explanation, but please check with MyChart first.  Please remember to sign up for MyChart if you have not done so, as this will be important to you in the future with finding out test results, communicating by private email, and scheduling acute appointments online when needed.  Please call if you change your mind about the prilosec 20 mg (or take OTC prilosec 20 mg per day)

## 2013-10-29 NOTE — Assessment & Plan Note (Signed)
With early satiety- for cxr, and GI referral - ? Need EGD

## 2013-10-29 NOTE — Progress Notes (Signed)
Pre visit review using our clinic review tool, if applicable. No additional management support is needed unless otherwise documented below in the visit note. 

## 2013-10-29 NOTE — Assessment & Plan Note (Signed)
stable overall by history and exam, recent data reviewed with pt, and pt to continue medical treatment as before,  to f/u any worsening symptoms or concerns BP Readings from Last 3 Encounters:  10/29/13 108/76  10/15/13 118/64  10/09/13 110/72
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
## 2013-10-29 NOTE — Assessment & Plan Note (Signed)
stable overall by history and exam, recent data reviewed with pt, and pt to continue medical treatment as before,  to f/u any worsening symptoms or concerns Lab Results  Component Value Date   HGBA1C 8.0* 05/07/2013   For f/u labs

## 2013-10-29 NOTE — Addendum Note (Signed)
Addended by: Biagio Borg on: 10/29/2013 05:44 PM   Modules accepted: Orders

## 2013-10-29 NOTE — Assessment & Plan Note (Signed)
stable overall by history and exam, recent data reviewed with pt, and pt to continue medical treatment as before,  to f/u any worsening symptoms or concerns / Lab Results  Component Value Date   LDLCALC 73 05/07/2013

## 2013-11-03 ENCOUNTER — Ambulatory Visit (HOSPITAL_COMMUNITY)
Admission: RE | Admit: 2013-11-03 | Discharge: 2013-11-03 | Disposition: A | Discharge status: Home / Self Care | Payer: Medicare HMO | Source: Ambulatory Visit | Attending: Internal Medicine | Admitting: Internal Medicine

## 2013-11-03 DIAGNOSIS — Z5189 Encounter for other specified aftercare: Secondary | ICD-10-CM | POA: Diagnosis not present

## 2013-11-03 DIAGNOSIS — M6281 Muscle weakness (generalized): Secondary | ICD-10-CM | POA: Insufficient documentation

## 2013-11-03 DIAGNOSIS — E1140 Type 2 diabetes mellitus with diabetic neuropathy, unspecified: Secondary | ICD-10-CM | POA: Insufficient documentation

## 2013-11-03 DIAGNOSIS — R262 Difficulty in walking, not elsewhere classified: Secondary | ICD-10-CM | POA: Insufficient documentation

## 2013-11-03 DIAGNOSIS — M79662 Pain in left lower leg: Secondary | ICD-10-CM | POA: Insufficient documentation

## 2013-11-03 DIAGNOSIS — M545 Low back pain, unspecified: Secondary | ICD-10-CM | POA: Insufficient documentation

## 2013-11-03 DIAGNOSIS — I10 Essential (primary) hypertension: Secondary | ICD-10-CM | POA: Insufficient documentation

## 2013-11-03 DIAGNOSIS — R269 Unspecified abnormalities of gait and mobility: Secondary | ICD-10-CM

## 2013-11-03 DIAGNOSIS — M79661 Pain in right lower leg: Secondary | ICD-10-CM | POA: Diagnosis not present

## 2013-11-03 DIAGNOSIS — M5441 Lumbago with sciatica, right side: Secondary | ICD-10-CM

## 2013-11-03 DIAGNOSIS — M79604 Pain in right leg: Secondary | ICD-10-CM | POA: Insufficient documentation
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
## 2013-11-04 ENCOUNTER — Telehealth: Payer: Self-pay | Admitting: Cardiology

## 2013-11-04 DIAGNOSIS — I483 Typical atrial flutter: Secondary | ICD-10-CM

## 2013-11-04 DIAGNOSIS — I5032 Chronic diastolic (congestive) heart failure: Secondary | ICD-10-CM

## 2013-11-04 MED ORDER — APIXABAN 5 MG PO TABS: 5.0000 mg | ORAL_TABLET | Freq: Two times a day (BID) | ORAL | Status: DC

## 2013-11-04 NOTE — Telephone Encounter (Signed)
Spoke with patient.  Advised we need proof of income for 2015 plus amount spent in pharmacies in 2015.  He will gather information and bring to office.

## 2013-11-04 NOTE — Telephone Encounter (Signed)
Pr would like some samples of Eliquis and would like to know if he can some help getting this medicine.

## 2013-11-04 NOTE — Telephone Encounter (Signed)
Patient notified samples are at front desk for pick up.   Will defer to Erasmo Downer to advise on assistance program

## 2013-11-05 ENCOUNTER — Encounter: Payer: Self-pay | Admitting: Nurse Practitioner

## 2013-11-05 ENCOUNTER — Ambulatory Visit (HOSPITAL_COMMUNITY)
Admission: RE | Admit: 2013-11-05 | Discharge: 2013-11-05 | Disposition: A | Discharge status: Home / Self Care | Payer: Medicare HMO | Source: Ambulatory Visit | Attending: Physical Therapy | Admitting: Physical Therapy

## 2013-11-05 DIAGNOSIS — Z5189 Encounter for other specified aftercare: Secondary | ICD-10-CM | POA: Diagnosis not present

## 2013-11-05 NOTE — Progress Notes (Signed)
Physical Therapy Treatment Patient Details  Name: [name] MRN: [id number] Date of Birth: [birth year]  Today's Date: 11/05/2013 Time: 1141-1222 PT Time Calculation (min): 41 min    Charges: Manual 1141-1200, TE 1200-1222 Visit#: 2 of 16  Re-eval: 12/03/13   Authorization: [employer]   Subjective: Symptoms/Limitations Symptoms: Pain and weakness down Rt LE. pain with bending over and any flexion type exercise.  Pain Assessment Currently in Pain?: Yes Pain Score: 8  Pain Location: Back Pain Orientation: Right  Exercise/Treatments Standing Other Standing Lumbar Exercises: Right foot forward with trunk Rt rotation for centralization of pain 20x, Right foot forward minor Lt side bending decrease pain 10x  Supine Ab Set: 10 reps;3 seconds Bent Knee Raise: 3 seconds;10 reps;Limitations Bent Knee Raise Limitations: Lt LE only Other Supine Lumbar Exercises: hooklaying hip rotation 10x, + 20x to Lt Other Supine Lumbar Exercises: 3D Thoracic spine excursion 10x small motions within pain limited   Manual Therapy Manual Therapy: Joint mobilization Joint Mobilization: L1-L5 posterior to anterior joint mobilizations grade 1-2. L3 grade 2-3 punilateral posterior to anterior joint mobilizations . Soft tissue mobilization of lumbar paraspinals.   Physical Therapy Assessment and Plan PT Assessment and Plan Clinical Impression Statement: Patient continues to display paing with all lumbar spine movements at end ranges  which is consistent with lumbar disck pathology and stenosis as indicated by patient's xrays. Pain in Rt LE was relieved with L3 Lt unilateral posterior to anterior mobilization with patient notiving the shooting pain into his Rt leg was less frequent following manual therapy.  All exercisers focused on pain relieveing positions and progressing ability to use improved Rt Rotation ROM.  PT Plan: Continue Initial focus on pain relieving exercises. Pain improved with flexion,  Lt sidebending, and Rt rotation based exercises.     Goals    Problem List Patient Active Problem List   Diagnosis Date Noted  . Lumbago 11/03/2013  . Low back pain radiating to right leg 11/03/2013  . Abnormality of gait 11/03/2013  . Difficulty walking 11/03/2013  . Loss of weight 10/29/2013  . Atrial flutter 08/26/2013  . Chest pain 04/05/2013  . Plantar fasciitis of right foot 02/18/2013  . Bruit 08/21/2012  . Sciatica of left side 08/15/2012  . Left hip pain 08/15/2012  . Headache 05/03/2012  . Increased prostate specific antigen (PSA) velocity 11/11/2011  . Right sided sciatica 11/10/2011  . Chronic LBP 05/03/2011  . Aortic root dilatation 02/02/2011  . Wheezing without diagnosis of asthma 02/02/2011  . Syncope 12/27/2010  . Left lumbar radiculopathy 05/30/2010  . Preventative health care 05/30/2010  . 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 DIASTOLIC HEART FAILURE, CHRONIC 06/09/2008  . KNEE PAIN, BILATERAL 04/21/2008  . LEG PAIN, BILATERAL 04/21/2008  . 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 PACEMAKER, PERMANENT 03/04/2008  . INSOMNIA-SLEEP DISORDER-UNSPEC 10/23/2007  . Lumbar radiculopathy, chronic 06/10/2007  . GOUT 04/22/2007  . 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 ERECTILE DYSFUNCTION 11/26/2006  . ALLERGIC RHINITIS 11/26/2006  . Type 2 diabetes, uncontrolled, with neuropathy 08/29/2006  . HYPERLIPIDEMIA 08/29/2006  . Overweight 08/29/2006  . DEPRESSION 08/29/2006  . HYPERTENSION 08/29/2006  . CORONARY ARTERY DISEASE 08/29/2006  . CONGESTIVE HEART FAILURE 08/29/2006  . BENIGN PROSTATIC HYPERTROPHY 08/29/2006    PT - End of Session Activity Tolerance: Patient tolerated treatment well General Behavior During Therapy: [facility] for tasks assessed/performed  GP    Floraine Buechler R 11/05/2013, 12:34 PM

## 2013-11-06 ENCOUNTER — Telehealth: Payer: Self-pay

## 2013-11-06 MED ORDER — TIZANIDINE HCL 4 MG PO TABS: 4.0000 mg | ORAL_TABLET | Freq: Four times a day (QID) | ORAL | Status: DC | PRN

## 2013-11-06 NOTE — Telephone Encounter (Signed)
Fax from [facility] is aware patient is on Cyclobenzaprine 5 mg tab and states is a high Risk medication to be avoided by elderly patient due to increased risk of serious side effects.  Alternative recommendations are Baclofen or Tizanidine tablets.  Advise please.

## 2013-11-06 NOTE — Telephone Encounter (Signed)
[place] for change to tizanidine - done erx

## 2013-11-07 ENCOUNTER — Ambulatory Visit: Payer: Medicare HMO | Admitting: Internal Medicine

## 2013-11-07 NOTE — Telephone Encounter (Signed)
Pharmacy informed.

## 2013-11-11 ENCOUNTER — Telehealth: Payer: Self-pay | Admitting: Internal Medicine

## 2013-11-11 ENCOUNTER — Encounter: Payer: Self-pay | Admitting: Nurse Practitioner

## 2013-11-11 ENCOUNTER — Ambulatory Visit (INDEPENDENT_AMBULATORY_CARE_PROVIDER_SITE_OTHER): Payer: Commercial Managed Care - HMO | Admitting: Nurse Practitioner

## 2013-11-11 VITALS — BP 120/80 | HR 68 | Ht 68.5 in | Wt 217.0 lb

## 2013-11-11 DIAGNOSIS — R1310 Dysphagia, unspecified: Secondary | ICD-10-CM

## 2013-11-11 MED ORDER — TIZANIDINE HCL 4 MG PO TABS: 4.0000 mg | ORAL_TABLET | Freq: Four times a day (QID) | ORAL | Status: DC | PRN

## 2013-11-11 NOTE — Patient Instructions (Addendum)
You have been scheduled for a Barium Esophogram at [facility] (1st floor of the hospital) on 11-18-2013 at 930 AM. Please arrive 15 minutes prior to your appointment for registration. Make certain not to have anything to eat or drink 6 hours prior to your test. If you need to reschedule for any reason, please contact radiology at [phone] to do so. __________________________________________________________________ A barium swallow is an examination that concentrates on views of the esophagus. This tends to be a double contrast exam (barium and two liquids which, when combined, create a gas to distend the wall of the oesophagus) or single contrast (non-ionic iodine based). The study is usually tailored to your symptoms so a good history is essential. Attention is paid during the study to the form, structure and configuration of the esophagus, looking for functional disorders (such as aspiration, dysphagia, achalasia, motility and reflux) EXAMINATION You may be asked to change into a gown, depending on the type of swallow being performed. A radiologist and radiographer will perform the procedure. The radiologist will advise you of the type of contrast selected for your procedure and direct you during the exam. You will be asked to stand, sit or lie in several different positions and to hold a small amount of fluid in your mouth before being asked to swallow while the imaging is performed .In some instances you may be asked to swallow barium coated marshmallows to assess the motility of a solid food bolus. The exam can be recorded as a digital or video fluoroscopy procedure. POST PROCEDURE It will take 1-2 days for the barium to pass through your system. To facilitate this, it is important, unless otherwise directed, to increase your fluids for the next 24-48hrs and to resume your normal diet.  This test typically takes about 30 minutes to  perform. __________________________________________________________________________________

## 2013-11-11 NOTE — Progress Notes (Signed)
HPI :  Patient is a 68 year old male, apparently known to Dr. Olevia Perches in the distant past. Patient has multiple medical problems, is on multiple medications including Apixaban. Patient is referred by his PCP for evaluation of early satiety and weight loss.  Per chart patient is only down 4 pounds  June of this year. Patient is a 2 to three-year history of intermittent solid food dysphasia which is becoming progressively worse. He can tolerate very small bites of food, no normal-sized bites. No odontophagia. Patient has no history of acid reflux. He has no other gastrointestinal complaints. Bowel movements are normal. No blood in stool. Patient has never had a screening colonoscopy  Past Medical History  Diagnosis Date  . 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 DIABETES MELLITUS, TYPE II 08/29/2006  . HYPERLIPIDEMIA 08/29/2006  . GOUT 04/22/2007  . Overweight(278.02) 08/29/2006  . ERECTILE DYSFUNCTION 11/26/2006  . DEPRESSION 08/29/2006  . HYPERTENSION 08/29/2006  . CORONARY ARTERY DISEASE 08/29/2006  . 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 DIASTOLIC HEART FAILURE, CHRONIC 06/09/2008  . BENIGN PROSTATIC HYPERTROPHY 08/29/2006  . LUMBAR RADICULOPATHY, RIGHT 06/10/2007  . INSOMNIA-SLEEP DISORDER-UNSPEC 10/23/2007  . PACEMAKER, PERMANENT 03/04/2008    pt denies this date  . Left lumbar radiculopathy 05/30/2010  . Myocardial infarction 12/27/10    "I've had several MIs"  . Sleep apnea     "if I lay flat I quit breathing; HOB up I'm fine"  . Arthritis     "lower back; going back down both my sciatic nerves"  . Syncope and collapse 12/27/10  . Aortic root dilatation 02/02/2011    Family History  Problem Relation Age of Onset  . Diabetes Mother   . Diabetes Sister   . Heart disease Sister     2 sister died with heart disease  . Coronary artery disease Other 22    male, first degree relative  . Diabetes Other     1st degree relative  . Heart  disease Sister    History  Substance Use Topics  . Smoking status: Former Smoker -- 3.00 packs/day for 9 years    Types: Cigarettes    Quit date: 12/09/1970  . Smokeless tobacco: Former Systems developer  . Alcohol Use: No   Current Outpatient Prescriptions  Medication Sig Dispense Refill  . allopurinol (ZYLOPRIM) 300 MG tablet take 1 tablet by mouth once daily  90 tablet  3  . apixaban (ELIQUIS) 5 MG TABS tablet Take 1 tablet (5 mg total) by mouth 2 (two) times daily.  56 tablet  0  . atorvastatin (LIPITOR) 80 MG tablet Take 1 tablet (80 mg total) by mouth daily.  90 tablet  3  . carvedilol (COREG) 25 MG tablet Take 25 mg by mouth 2 (two) times daily.      . Cholecalciferol (VITAMIN D) 1000 UNITS capsule Take 1 capsule by mouth daily.      . fluticasone (FLONASE) 50 MCG/ACT nasal spray Place 2 sprays into both nostrils 2 (two) times daily.      . furosemide (LASIX) 40 MG tablet Take 1 tablet (40 mg total) by mouth daily.  90 tablet  3  . gabapentin (NEURONTIN) 300 MG capsule 2 tabs by mouth three times per day  60 capsule  0  . glipiZIDE (GLUCOTROL XL) 5 MG 24 hr tablet Take 1 tablet (5 mg total) by mouth daily with breakfast.  90 tablet  3  . glucose blood (ONE TOUCH ULTRA TEST)  test strip Use to check blood sugar twice a day Dx 250.00  180 each  3  . ibuprofen (ADVIL,MOTRIN) 200 MG tablet Take 800 mg by mouth daily as needed for mild pain or moderate pain.       Marland Kitchen lisinopril (PRINIVIL,ZESTRIL) 2.5 MG tablet Take 1 tablet (2.5 mg total) by mouth daily.  90 tablet  3  . metFORMIN (GLUCOPHAGE) 500 MG tablet Take 1,000 mg by mouth 2 (two) times daily.      . Multiple Vitamin (MULTIVITAMIN WITH MINERALS) TABS tablet Take 1 tablet by mouth every morning.      . potassium chloride SA (K-DUR,KLOR-CON) 20 MEQ tablet Take 1 tablet (20 mEq total) by mouth every evening.  90 tablet  3  . predniSONE (DELTASONE) 50 MG tablet Take 1 tablet (50 mg total) by mouth daily.  5 tablet  0  . sitaGLIPtin (JANUVIA) 100  MG tablet Take 100 mg by mouth every evening.      . tamsulosin (FLOMAX) 0.4 MG CAPS capsule Take 2 capsules by mouth every 12 (twelve) hours.      Marland Kitchen tiZANidine (ZANAFLEX) 4 MG tablet Take 1 tablet (4 mg total) by mouth every 6 (six) hours as needed for muscle spasms.  60 tablet  1  . traMADol (ULTRAM) 50 MG tablet Take 50 mg by mouth every 12 (twelve) hours as needed for moderate pain or severe pain.       No current facility-administered medications for this visit.   Allergies  Allergen Reactions  . Crestor [Rosuvastatin Calcium] Other (See Comments)    Urination of blood    Review of Systems: All systems reviewed and negative except where noted in HPI.    Physical Exam: BP 120/80  Pulse 68  Ht 5' 8.5" (1.74 m)  Wt 217 lb (98.431 kg)  BMI 32.51 kg/m2 Constitutional: Pleasant,well-developed, white male in no acute distress. HEENT: Normocephalic and atraumatic. Conjunctivae are normal. No scleral icterus. Neck supple.  Cardiovascular: Normal rate, regular rhythm.  Pulmonary/chest: Effort normal and breath sounds normal. No wheezing, rales or rhonchi. Abdominal: Soft, nondistended, nontender. Bowel sounds active throughout. There are no masses palpable. No hepatomegaly. Extremities: no edema Lymphadenopathy: No cervical adenopathy noted. Neurological: Alert and oriented to person place and time. Skin: Skin is warm and dry. No rashes noted. Psychiatric: Normal mood and affect. Behavior is normal.   ASSESSMENT AND PLAN:  53. 68 year old male with multiple, significant medical problems. He has a pacemaker , is on multiple medications including Eliquis  2. Two-three year history of intermittent solid food dysphagia, progressive. Overall his weight appears to be only 4 pounds down since June of this year. Patient is certainly at increased risk for procedures. Will obtain a barium swallow with tablet. Will call him with results and if abnormal will proceed with EGD with possible  dilation (if Eliquis can safely be held).   3. Colon cancer screening. Patient is appropriate age for screening colonoscopy but risk of the procedure may outweigh benefits given patient's significant comorbidities. It is reassuring that he has no Ingram of colon cancer, no alarm symptoms. No history of anemia. We can discuss Cologuard or other form of colon cancer screening at his follow up visit.

## 2013-11-11 NOTE — Telephone Encounter (Signed)
Patient need 90 day supply flexeril muscle relaxer instead of 30 day.

## 2013-11-12 ENCOUNTER — Ambulatory Visit (HOSPITAL_COMMUNITY)
Admission: RE | Admit: 2013-11-12 | Discharge: 2013-11-12 | Disposition: A | Discharge status: Home / Self Care | Payer: Medicare HMO | Source: Ambulatory Visit | Attending: Internal Medicine | Admitting: Internal Medicine

## 2013-11-12 DIAGNOSIS — Z5189 Encounter for other specified aftercare: Secondary | ICD-10-CM | POA: Diagnosis not present

## 2013-11-12 NOTE — Progress Notes (Signed)
Physical Therapy Treatment Patient Details  Name: [name] MRN: [id number] Date of Birth: August 28, 1945  Today's Date: 11/12/2013 Time: 1340-1426 PT Time Calculation (min): 46 min   Charges: Manual 1340-1356, TE 4103-0131 Visit#: 3 of 16  Re-eval: 12/03/13 Assessment Diagnosis: Rt Lumbar radiculopathy resulting in difficulty walking, Rt LE pain, weakness and stiffness.  Next MD Visit: Charlann Boxer 4 weeks, Cathlean Cower with family physician  Prior Therapy: no  Authorization: [employer]   Subjective: Symptoms/Limitations Symptoms: Patient notes pin frequency and intensity ar improvign but notes occasional flair up over the last several days requiring patient to use his wifes walker. patient states concer over possible surgery due to limited ability to care for wife if he has surgfery.  Pain Assessment Currently in Pain?: Yes Pain Score: 5  Pain Location: Back Pain Orientation: Right  Exercise/Treatments Stretches Passive Hamstring Stretch: 4 reps;20 seconds;Limitations Passive Hamstring Stretch Limitations: therapist assist Hip Flexor Stretch: 2 reps;10 seconds Prone on Elbows Stretch: 20 seconds;4 reps Press Ups: Limitations Press Ups Limitations: increases pain ITB Stretch: 4 reps;10 seconds;Limitations ITB Stretch Limitations: Rt only Piriformis Stretch: 2 reps;20 seconds;Limitations Piriformis Stretch Limitations: manual assist from therapist Standing Other Standing Lumbar Exercises: Right foot forward with trunk Rt rotation for centralization of pain 20x, Right foot forward minor Lt side bending decrease pain 10x  Other Standing Lumbar Exercises: 3D hip excursions 10x.  Supine Ab Set: 10 reps;3 seconds Bent Knee Raise: 3 seconds;10 reps;Limitations Bent Knee Raise Limitations: Lt LE only Other Supine Lumbar Exercises: hooklaying hip rotation 10x, + 20x to Lt Other Supine Lumbar Exercises: 3D Thoracic spine excursion 10x small motions within pain limited    Physical Therapy Assessment and Plan PT Assessment and Plan Clinical Impression Statement: Patient displasy slow progress but is having improvign symptoms, decreased pain, and displays improved ability to move throughout hips and lumbar spine. Patient conitnues to have success with pain limited activities and non-painful strengtheing exercises resulting in improved mobility and pain at end of session. supine Rt bent knee raise continues to be limited by pain.  PT Plan: Continue Initial focus on pain relieving exercises. Pain improved with flexion, Lt sidebending, and Rt rotation based exercises. Continue 3D hip excursions and 3D thoracic spine excursions through pain limited ROM. Conitnue to attemp supine Rt bent knee raise which is currently limited by pain.     Goals PT Short Term Goals PT Short Term Goal 1: Patient will be able to flex trunk >70 degrees to improve ability to sit to stabnd withtou UE use by improving mechanics  PT Short Term Goal 1 - Progress: Progressing toward goal PT Short Term Goal 2: Patient will dmeosntrate increased trunk flexion strengthof 4-/5 indicatign improved trunk stability PT Short Term Goal 2 - Progress: Progressing toward goal PT Short Term Goal 3: Patient will be able to extend >25 degrees to more easuily look at cieling tiles.  PT Short Term Goal 3 - Progress: Progressing toward goal PT Long Term Goals PT Long Term Goal 1: Patient will be able to forward flex >80 degrees to touch toes to more easily tie shoes. PT Long Term Goal 1 - Progress: Progressing toward goal PT Long Term Goal 2: Patient will dmeosntrate increased trunk flexion strengthof 4/5 indicating improved trunk stability to maintain good seated and standing posture PT Long Term Goal 2 - Progress: Progressing toward goal Long Term Goal 3: Patient will be able to walk/sit/stand >5minutes for increased quality of life.  Long Term Goal 3 Progress: Progressing  toward goal  Problem List Patient  Active Problem List   Diagnosis Date Noted  . Dysphagia 11/11/2013  . Lumbago 11/03/2013  . Low back pain radiating to right leg 11/03/2013  . Abnormality of gait 11/03/2013  . Difficulty walking 11/03/2013  . Loss of weight 10/29/2013  . Atrial flutter 08/26/2013  . Chest pain 04/05/2013  . Plantar fasciitis of right foot 02/18/2013  . Bruit 08/21/2012  . Sciatica of left side 08/15/2012  . Left hip pain 08/15/2012  . Headache 05/03/2012  . Increased prostate specific antigen (PSA) velocity 11/11/2011  . Right sided sciatica 11/10/2011  . Chronic LBP 05/03/2011  . Aortic root dilatation 02/02/2011  . Wheezing without diagnosis of asthma 02/02/2011  . Syncope 12/27/2010  . Left lumbar radiculopathy 05/30/2010  . Preventative health care 05/30/2010  . 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 DIASTOLIC HEART FAILURE, CHRONIC 06/09/2008  . KNEE PAIN, BILATERAL 04/21/2008  . LEG PAIN, BILATERAL 04/21/2008  . 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 PACEMAKER, PERMANENT 03/04/2008  . INSOMNIA-SLEEP DISORDER-UNSPEC 10/23/2007  . Lumbar radiculopathy, chronic 06/10/2007  . GOUT 04/22/2007  . 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 ERECTILE DYSFUNCTION 11/26/2006  . ALLERGIC RHINITIS 11/26/2006  . Type 2 diabetes, uncontrolled, with neuropathy 08/29/2006  . HYPERLIPIDEMIA 08/29/2006  . Overweight 08/29/2006  . DEPRESSION 08/29/2006  . HYPERTENSION 08/29/2006  . CORONARY ARTERY DISEASE 08/29/2006  . CONGESTIVE HEART FAILURE 08/29/2006  . BENIGN PROSTATIC HYPERTROPHY 08/29/2006    PT - End of Session Activity Tolerance: Patient tolerated treatment well General Behavior During Therapy: [facility] for tasks assessed/performed  GP    Laquinn Shippy R 11/12/2013, 2:39 PM

## 2013-11-12 NOTE — Progress Notes (Signed)
Reviewed and agree with Ba esophagram, if stricture, then he mau have EGD./colon.

## 2013-11-14 ENCOUNTER — Ambulatory Visit (HOSPITAL_COMMUNITY)
Admission: RE | Admit: 2013-11-14 | Discharge: 2013-11-14 | Disposition: A | Discharge status: Home / Self Care | Payer: Medicare HMO | Source: Ambulatory Visit | Attending: Internal Medicine | Admitting: Internal Medicine

## 2013-11-14 DIAGNOSIS — Z5189 Encounter for other specified aftercare: Secondary | ICD-10-CM | POA: Diagnosis not present

## 2013-11-14 NOTE — Progress Notes (Signed)
Physical Therapy Treatment Patient Details  Name: [name] MRN: [id number] Date of Birth: Sep 06, 1945  Today's Date: 11/14/2013 Time: 9024-0973 PT Time Calculation (min): 48 min   Charges: Manual 1345-1400, TE 5329-9242, Self Care [id number] Visit#: 4 of 16  Re-eval: 12/03/13 Assessment Diagnosis: Rt Lumbar radiculopathy resulting in difficulty walking, Rt LE pain, weakness and stiffness.  Next MD Visit: Charlann Boxer 4 weeks, Cathlean Cower with family physician  Prior Therapy: no  Authorization: [employer]  Authorization Visit#:  4 of  16   Subjective: Symptoms/Limitations Symptoms: Patient arrives states he has had sifgnificantly increased pain since 4 hours followinmg last session. He Notes that immediately follwoign last session he had much uimproved pain and was feeling really good and was excited by his progreess made in therapy, but after taking a pain pill and having a nap he had severely increased pain and has had pain ever since  Exercise/Treatments Stretches Passive Hamstring Stretch: 4 reps;20 seconds;Limitations Passive Hamstring Stretch Limitations: therapist assist Standing Other Standing Lumbar Exercises: Standing lumbar sidebending in neutral extension/flexion.  Supine Other Supine Lumbar Exercises: hooklaying hip rotation 10x, + 20x to Lt Other Supine Lumbar Exercises: 3D Thoracic spine excursion 10x small motions within pain limited   Manual Therapy Manual Therapy: Myofascial release Joint Mobilization: L1-L5 posterior to anterior joint mobilizations grade 1-2. L3 grade 2-3 unilateral posterior to anterior joint mobilizations Myofascial Release: lumbarr paraspinals and quadratus lumborum.   Physical Therapy Assessment and Plan PT Assessment and Plan Clinical Impression Statement: Patient demsotrates severe pain today with no releif with any manual therapy or exercise performed. Patient noted a minor improvement during last exercise performed in standing  with patient standing with good upright posture and side bending to left and right with minimal movement though pain free, patient noted improved trunk mobility follwoing exercise. Patient was educated  continuing to do exercises follwoign therapy even thogh he is in pin at rest as there are no painless exercises if patient is already in pain. Patient notes some improvement with sitting AROM exercises throguhtou limited ROM noting that after the exercises were performed pain improved minimally. Patient intructed that if he contiues to have severe 10/10 pain and not sustainable relief with exercises in the next week he shoudl return to MD for further diagnosis and consider possible surgical options.  PT Plan: Continue focus on pain relieving exercises. Pain improved with flexion, Lt sidebending, and Rt rotation based exercises. Reinitiate lumbar stabilization exercises next session: bent knee raises, ab sets, trunk rotation, UE single arm raise to improve lumbar spine stability and decrease pain.     Problem List Patient Active Problem List   Diagnosis Date Noted  . Dysphagia 11/11/2013  . Lumbago 11/03/2013  . Low back pain radiating to right leg 11/03/2013  . Abnormality of gait 11/03/2013  . Difficulty walking 11/03/2013  . Loss of weight 10/29/2013  . Atrial flutter 08/26/2013  . Chest pain 04/05/2013  . Plantar fasciitis of right foot 02/18/2013  . Bruit 08/21/2012  . Sciatica of left side 08/15/2012  . Left hip pain 08/15/2012  . Headache 05/03/2012  . Increased prostate specific antigen (PSA) velocity 11/11/2011  . Right sided sciatica 11/10/2011  . Chronic LBP 05/03/2011  . Aortic root dilatation 02/02/2011  . Wheezing without diagnosis of asthma 02/02/2011  . Syncope 12/27/2010  . Left lumbar radiculopathy 05/30/2010  . Preventative health care 05/30/2010  . 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 DIASTOLIC HEART FAILURE, CHRONIC 06/09/2008  . KNEE PAIN, BILATERAL  04/21/2008  . LEG PAIN, BILATERAL 04/21/2008  . 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 PACEMAKER, PERMANENT 03/04/2008  . INSOMNIA-SLEEP DISORDER-UNSPEC 10/23/2007  . Lumbar radiculopathy, chronic 06/10/2007  . GOUT 04/22/2007  . 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 ERECTILE DYSFUNCTION 11/26/2006  . ALLERGIC RHINITIS 11/26/2006  . Type 2 diabetes, uncontrolled, with neuropathy 08/29/2006  . HYPERLIPIDEMIA 08/29/2006  . Overweight 08/29/2006  . DEPRESSION 08/29/2006  . HYPERTENSION 08/29/2006  . CORONARY ARTERY DISEASE 08/29/2006  . CONGESTIVE HEART FAILURE 08/29/2006  . BENIGN PROSTATIC HYPERTROPHY 08/29/2006    PT - End of Session Activity Tolerance: Patient tolerated treatment well General Behavior During Therapy: [facility] for tasks assessed/performed  GP    Jaivyn Gulla R 11/14/2013, 3:29 PM

## 2013-11-17 ENCOUNTER — Ambulatory Visit (HOSPITAL_COMMUNITY): Payer: Commercial Managed Care - HMO

## 2013-11-18 ENCOUNTER — Ambulatory Visit (HOSPITAL_COMMUNITY)
Admission: RE | Admit: 2013-11-18 | Discharge: 2013-11-18 | Disposition: A | Discharge status: Home / Self Care | Payer: Medicare HMO | Source: Ambulatory Visit | Attending: Nurse Practitioner | Admitting: Nurse Practitioner

## 2013-11-18 ENCOUNTER — Telehealth: Payer: Self-pay | Admitting: Family Medicine

## 2013-11-18 ENCOUNTER — Other Ambulatory Visit: Payer: Self-pay | Admitting: *Deleted

## 2013-11-18 DIAGNOSIS — T17908A Unspecified foreign body in respiratory tract, part unspecified causing other injury, initial encounter: Secondary | ICD-10-CM

## 2013-11-18 DIAGNOSIS — R1310 Dysphagia, unspecified: Secondary | ICD-10-CM | POA: Insufficient documentation

## 2013-11-18 NOTE — Telephone Encounter (Signed)
Call from Radiologist. Patient had frank aspiration of barium into trachea during barium swallow. Reason for pharyngeal dysphagia unclear.  Evaluation of esophagus was suboptimal (not enough barium) but no gross esophageal lesions appreciated. He needs MBSS for further evaluation. Might need eventual head CT scan to rule out CVA.

## 2013-11-18 NOTE — Telephone Encounter (Signed)
Pt stopped by office to let you know that the therapy you prescribed for him for the pinched nerve in the R leg seems to be making it worse.  Hurts to stand on the leg.  He would like for you to call him.

## 2013-11-18 NOTE — Telephone Encounter (Signed)
Ba esophagram unable to perform due to aspiration. Possible esophageal stricture as well as neurogenic dysphagia. Will need to r/o obstruction before  proceeding with MBS. Please schedule foe EGD/dil. Please obtain permission from PCP to hold Eliquis. ? [facility] EGD, conscious sedation.

## 2013-11-19 ENCOUNTER — Encounter: Payer: Self-pay | Admitting: *Deleted

## 2013-11-19 ENCOUNTER — Other Ambulatory Visit: Payer: Self-pay | Admitting: *Deleted

## 2013-11-19 ENCOUNTER — Other Ambulatory Visit (HOSPITAL_COMMUNITY): Payer: Self-pay | Admitting: Nurse Practitioner

## 2013-11-19 ENCOUNTER — Inpatient Hospital Stay (HOSPITAL_COMMUNITY)
Admission: RE | Admit: 2013-11-19 | Discharge: 2013-11-19 | Disposition: A | Discharge status: Home / Self Care | Payer: Commercial Managed Care - HMO | Source: Ambulatory Visit | Attending: Physical Therapy | Admitting: Physical Therapy

## 2013-11-19 ENCOUNTER — Telehealth: Payer: Self-pay | Admitting: *Deleted

## 2013-11-19 DIAGNOSIS — R1310 Dysphagia, unspecified: Secondary | ICD-10-CM

## 2013-11-19 NOTE — Telephone Encounter (Signed)
Letter sent to Dr. Stanford Breed re: Eliquis.

## 2013-11-19 NOTE — Progress Notes (Signed)
Patient Discharge Patient Details  Name: [name] MRN: [id number] Date of Birth: December 19, 1945  Today's Date: 11/19/2013  Patient arrives noting that he feels he made initial improvements with physical therapy but that he has since flared up and had no overall improvements and is seeking to return to MD to discuss other treatment options, verbally stating he wants to discuss surgical options. Patient states happiness with what was accomplished in therapy but notes he is no longer making progress and is instead getting worse. Patient discharged from therapy.  Caelin Rosen R 11/19/2013, 2:14 PM

## 2013-11-19 NOTE — Telephone Encounter (Signed)
Recommendations on pre visit appointment information.

## 2013-11-19 NOTE — Telephone Encounter (Signed)
Spoke with patient and gave him appointment date, time and place. Cancelled MBSS. Scheduled patient for pre visit on 11/24/13 at 2:30 PM.

## 2013-11-19 NOTE — Telephone Encounter (Signed)
Spoke with [facility] endo and scheduled EGD/dil on 12/01/13 at 10:30 AM.

## 2013-11-19 NOTE — Telephone Encounter (Signed)
DC apixaban 2 days prior to procedure and resume day after. [name]

## 2013-11-19 NOTE — Telephone Encounter (Signed)
11/19/2013  RE: KELSO BIBBY DOB: 1945/05/20 MRN: [id number]  Dear Dr. Stanford Breed,   We have scheduled the above patient for an endoscopy procedure. Our records show that he is on anticoagulation therapy.  Please advise as to  whether patient may hold Eliquis two days prior to procedure on 12/01/13. Please  route your response to Leone Payor, RN.   Sincerely  Leone Payor

## 2013-11-19 NOTE — Addendum Note (Signed)
Addended by: Hulan Saas on: 11/19/2013 02:57 PM   Modules accepted: Orders

## 2013-11-21 ENCOUNTER — Other Ambulatory Visit (HOSPITAL_COMMUNITY): Payer: Commercial Managed Care - HMO

## 2013-11-21 ENCOUNTER — Encounter: Payer: Self-pay | Admitting: Family Medicine

## 2013-11-21 ENCOUNTER — Ambulatory Visit (INDEPENDENT_AMBULATORY_CARE_PROVIDER_SITE_OTHER): Payer: Commercial Managed Care - HMO | Admitting: Family Medicine

## 2013-11-21 ENCOUNTER — Ambulatory Visit (HOSPITAL_COMMUNITY): Payer: Commercial Managed Care - HMO | Admitting: Physical Therapy

## 2013-11-21 ENCOUNTER — Ambulatory Visit (HOSPITAL_COMMUNITY): Payer: Medicare HMO

## 2013-11-21 VITALS — BP 130/84 | HR 73 | Temp 97.5°F | Wt 219.0 lb

## 2013-11-21 DIAGNOSIS — M79604 Pain in right leg: Secondary | ICD-10-CM

## 2013-11-21 DIAGNOSIS — M545 Low back pain: Secondary | ICD-10-CM

## 2013-11-21 MED ORDER — TRAMADOL HCL 50 MG PO TABS: 50.0000 mg | ORAL_TABLET | Freq: Two times a day (BID) | ORAL | Status: DC | PRN
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
## 2013-11-21 NOTE — Progress Notes (Signed)
Pre visit review using our clinic review tool, if applicable. No additional management support is needed unless otherwise documented below in the visit note. 

## 2013-11-21 NOTE — Patient Instructions (Signed)
Good to see you You are doing well overall.  Will give you the tramadol again.  Ice is your friend We will get another epidural and see if it will last longer Call me 10-14 days after epidural and tell me how you are oding.  If still in pain will get you in with a surgeon.

## 2013-11-24 ENCOUNTER — Ambulatory Visit (AMBULATORY_SURGERY_CENTER): Payer: Self-pay | Admitting: *Deleted

## 2013-11-24 VITALS — Ht 68.5 in | Wt 219.0 lb

## 2013-11-24 DIAGNOSIS — R1310 Dysphagia, unspecified: Secondary | ICD-10-CM

## 2013-11-24 NOTE — Progress Notes (Signed)
Pt has a Psychologist, forensic. ewm Pt is on eliquis. ewm Pt to have moderate sedation. ewm No new cardiac issues. ewm

## 2013-11-26 ENCOUNTER — Ambulatory Visit (HOSPITAL_COMMUNITY): Payer: Commercial Managed Care - HMO | Admitting: Physical Therapy
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
## 2013-11-27 ENCOUNTER — Encounter: Payer: Self-pay | Admitting: Cardiology

## 2013-11-27 ENCOUNTER — Ambulatory Visit (INDEPENDENT_AMBULATORY_CARE_PROVIDER_SITE_OTHER): Payer: Commercial Managed Care - HMO | Admitting: Cardiology

## 2013-11-27 VITALS — BP 100/60 | HR 78 | Ht 68.5 in | Wt 219.4 lb

## 2013-11-27 DIAGNOSIS — I10 Essential (primary) hypertension: Secondary | ICD-10-CM

## 2013-11-27 DIAGNOSIS — Z950 Presence of cardiac pacemaker: Secondary | ICD-10-CM

## 2013-11-27 DIAGNOSIS — I484 Atypical atrial flutter: Secondary | ICD-10-CM

## 2013-11-27 DIAGNOSIS — I25710 Atherosclerosis of autologous vein coronary artery bypass graft(s) with unstable angina pectoris: Secondary | ICD-10-CM

## 2013-11-27 DIAGNOSIS — I5032 Chronic diastolic (congestive) heart failure: Secondary | ICD-10-CM

## 2013-11-27 DIAGNOSIS — I483 Typical atrial flutter: Secondary | ICD-10-CM

## 2013-11-27 LAB — CBC
HEMATOCRIT: 36 % — AB (ref 39.0–52.0)
Hemoglobin: 12.4 g/dL — ABNORMAL LOW (ref 13.0–17.0)
MCH: 30 pg (ref 26.0–34.0)
MCHC: 34.4 g/dL (ref 30.0–36.0)
MCV: 87.2 fL (ref 78.0–100.0)
Platelets: 179 10*3/uL (ref 150–400)
RBC: 4.13 MIL/uL — ABNORMAL LOW (ref 4.22–5.81)
RDW: 14.2 % (ref 11.5–15.5)
WBC: 7.9 10*3/uL (ref 4.0–10.5)

## 2013-11-27 LAB — BASIC METABOLIC PANEL WITH GFR
BUN: 14 mg/dL (ref 6–23)
CHLORIDE: 102 meq/L (ref 96–112)
CO2: 25 meq/L (ref 19–32)
Calcium: 8.8 mg/dL (ref 8.4–10.5)
Creat: 0.83 mg/dL (ref 0.50–1.35)
GFR, Est African American: >89 mL/min
GFR, Est Non African American: >89 mL/min
Glucose, Bld: 132 mg/dL — ABNORMAL HIGH (ref 70–99)
Potassium: 4.5 mEq/L (ref 3.5–5.3)
Sodium: 136 mEq/L (ref 135–145)

## 2013-11-27 NOTE — Assessment & Plan Note (Signed)
Blood pressure controlled. Continue present medications. 

## 2013-11-27 NOTE — Patient Instructions (Signed)
Your physician wants you to follow-up in: 6 MONTHS WITH DR CRENSHAW You will receive a reminder letter in the mail two months in advance. If you don't receive a letter, please call our office to schedule the follow-up appointment.   Your physician recommends that you HAVE LAB WORK TODAY 

## 2013-11-27 NOTE — Assessment & Plan Note (Signed)
Patient remains in atrial flutter. He is asymptomatic. Continue carvedilol for rate control. Embolic risk factors include age greater than 10, coronary artery disease, history of congestive heart failure, hypertension and diabetes mellitus. Continue apixaban 5 mg by mouth twice a day. Check CBC, renal function. Dr. Lovena Le reviewed and did not recommend atrial flutter ablation.

## 2013-11-27 NOTE — Assessment & Plan Note (Signed)
Continue statin. Not on aspirin given the for anticoagulation. 

## 2013-11-27 NOTE — Assessment & Plan Note (Signed)
Euvolemic on examination. Continue present dose of Lasix. Check potassium and renal function. 

## 2013-11-27 NOTE — Assessment & Plan Note (Signed)
Managed by electrophysiology.

## 2013-11-27 NOTE — Assessment & Plan Note (Signed)
Continue statin. 

## 2013-11-28 ENCOUNTER — Ambulatory Visit (HOSPITAL_COMMUNITY): Payer: Commercial Managed Care - HMO | Admitting: Physical Therapy

## 2013-12-01 ENCOUNTER — Encounter (HOSPITAL_COMMUNITY): Payer: Self-pay | Admitting: *Deleted

## 2013-12-01 ENCOUNTER — Other Ambulatory Visit: Payer: Self-pay | Admitting: *Deleted

## 2013-12-01 ENCOUNTER — Ambulatory Visit (HOSPITAL_COMMUNITY)
Admission: RE | Admit: 2013-12-01 | Discharge: 2013-12-01 | Disposition: A | Discharge status: Home / Self Care | Payer: Medicare HMO | Source: Ambulatory Visit | Attending: Internal Medicine | Admitting: Internal Medicine

## 2013-12-01 ENCOUNTER — Encounter (HOSPITAL_COMMUNITY)
Admission: RE | Disposition: A | Discharge status: Home / Self Care | Payer: Self-pay | Source: Ambulatory Visit | Attending: Internal Medicine

## 2013-12-01 DIAGNOSIS — G4700 Insomnia, unspecified: Secondary | ICD-10-CM | POA: Diagnosis not present

## 2013-12-01 DIAGNOSIS — Z87891 Personal history of nicotine dependence: Secondary | ICD-10-CM | POA: Insufficient documentation

## 2013-12-01 DIAGNOSIS — I5032 Chronic diastolic (congestive) heart failure: Secondary | ICD-10-CM | POA: Insufficient documentation

## 2013-12-01 DIAGNOSIS — I77819 Aortic ectasia, unspecified site: Secondary | ICD-10-CM | POA: Insufficient documentation

## 2013-12-01 DIAGNOSIS — E785 Hyperlipidemia, unspecified: Secondary | ICD-10-CM | POA: Insufficient documentation

## 2013-12-01 DIAGNOSIS — Z950 Presence of cardiac pacemaker: Secondary | ICD-10-CM | POA: Diagnosis not present

## 2013-12-01 DIAGNOSIS — N400 Enlarged prostate without lower urinary tract symptoms: Secondary | ICD-10-CM | POA: Insufficient documentation

## 2013-12-01 DIAGNOSIS — M4686 Other specified inflammatory spondylopathies, lumbar region: Secondary | ICD-10-CM | POA: Diagnosis not present

## 2013-12-01 DIAGNOSIS — I252 Old myocardial infarction: Secondary | ICD-10-CM | POA: Insufficient documentation

## 2013-12-01 DIAGNOSIS — Z951 Presence of aortocoronary bypass graft: Secondary | ICD-10-CM | POA: Insufficient documentation

## 2013-12-01 DIAGNOSIS — I10 Essential (primary) hypertension: Secondary | ICD-10-CM | POA: Diagnosis not present

## 2013-12-01 DIAGNOSIS — M109 Gout, unspecified: Secondary | ICD-10-CM | POA: Diagnosis not present

## 2013-12-01 DIAGNOSIS — E119 Type 2 diabetes mellitus without complications: Secondary | ICD-10-CM | POA: Diagnosis not present

## 2013-12-01 DIAGNOSIS — N529 Male erectile dysfunction, unspecified: Secondary | ICD-10-CM | POA: Insufficient documentation

## 2013-12-01 DIAGNOSIS — R1310 Dysphagia, unspecified: Secondary | ICD-10-CM

## 2013-12-01 DIAGNOSIS — Z888 Allergy status to other drugs, medicaments and biological substances status: Secondary | ICD-10-CM | POA: Insufficient documentation

## 2013-12-01 DIAGNOSIS — G4730 Sleep apnea, unspecified: Secondary | ICD-10-CM | POA: Insufficient documentation

## 2013-12-01 DIAGNOSIS — I2510 Atherosclerotic heart disease of native coronary artery without angina pectoris: Secondary | ICD-10-CM | POA: Insufficient documentation

## 2013-12-01 DIAGNOSIS — R1314 Dysphagia, pharyngoesophageal phase: Secondary | ICD-10-CM

## 2013-12-01 DIAGNOSIS — K208 Other esophagitis: Secondary | ICD-10-CM | POA: Insufficient documentation

## 2013-12-01 DIAGNOSIS — F329 Major depressive disorder, single episode, unspecified: Secondary | ICD-10-CM | POA: Diagnosis not present

## 2013-12-01 DIAGNOSIS — K219 Gastro-esophageal reflux disease without esophagitis: Secondary | ICD-10-CM

## 2013-12-01 HISTORY — PX: ESOPHAGOGASTRODUODENOSCOPY: SHX5428

## 2013-12-01 LAB — GLUCOSE, CAPILLARY: Glucose-Capillary: 187 mg/dL — ABNORMAL HIGH (ref 70–99)

## 2013-12-01 SURGERY — EGD (ESOPHAGOGASTRODUODENOSCOPY)
Anesthesia: Moderate Sedation

## 2013-12-01 MED ORDER — FENTANYL CITRATE 0.05 MG/ML IJ SOLN
INTRAMUSCULAR | Status: DC | PRN
  Administered 2013-12-01 (×2): 25 ug via INTRAVENOUS

## 2013-12-01 MED ORDER — MIDAZOLAM HCL 10 MG/2ML IJ SOLN
INTRAMUSCULAR | Status: AC
  Filled 2013-12-01: qty 2

## 2013-12-01 MED ORDER — SODIUM CHLORIDE 0.9 % IV SOLN
INTRAVENOUS | Status: DC
  Administered 2013-12-01: 500 mL via INTRAVENOUS

## 2013-12-01 MED ORDER — FENTANYL CITRATE 0.05 MG/ML IJ SOLN
INTRAMUSCULAR | Status: AC
  Filled 2013-12-01: qty 2

## 2013-12-01 MED ORDER — BUTAMBEN-TETRACAINE-BENZOCAINE 2-2-14 % EX AERO
INHALATION_SPRAY | CUTANEOUS | Status: DC | PRN
  Administered 2013-12-01: 1 via TOPICAL

## 2013-12-01 MED ORDER — MIDAZOLAM HCL 10 MG/2ML IJ SOLN
INTRAMUSCULAR | Status: DC | PRN
  Administered 2013-12-01: 2 mg via INTRAVENOUS
  Administered 2013-12-01: 1 mg via INTRAVENOUS

## 2013-12-01 NOTE — Interval H&P Note (Signed)
History and Physical Interval Note:  12/01/2013 9:34 AM  [name]  has presented today for surgery, with the diagnosis of dysphagia, aspiration, r/o mass  The various methods of treatment have been discussed with the patient and family. After consideration of risks, benefits and other options for treatment, the patient has consented to  Procedure(s): ESOPHAGOGASTRODUODENOSCOPY (EGD) (N/A) BALLOON DILATION (N/A) as a surgical intervention .  The patient's history has been reviewed, patient examined, no change in status, stable for surgery.  I have reviewed the patient's chart and labs.  Questions were answered to the patient's satisfaction.     [name]

## 2013-12-01 NOTE — H&P (View-Only) (Signed)
HPI :  Patient is a 68 year old male, apparently known to Dr. Olevia Perches in the distant past. Patient has multiple medical problems, is on multiple medications including Apixaban. Patient is referred by his PCP for evaluation of early satiety and weight loss.  Per chart patient is only down 4 pounds  June of this year. Patient is a 2 to three-year history of intermittent solid food dysphasia which is becoming progressively worse. He can tolerate very small bites of food, no normal-sized bites. No odontophagia. Patient has no history of acid reflux. He has no other gastrointestinal complaints. Bowel movements are normal. No blood in stool. Patient has never had a screening colonoscopy  Past Medical History  Diagnosis Date  . 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 DIABETES MELLITUS, TYPE II 08/29/2006  . HYPERLIPIDEMIA 08/29/2006  . GOUT 04/22/2007  . Overweight(278.02) 08/29/2006  . ERECTILE DYSFUNCTION 11/26/2006  . DEPRESSION 08/29/2006  . HYPERTENSION 08/29/2006  . CORONARY ARTERY DISEASE 08/29/2006  . 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 DIASTOLIC HEART FAILURE, CHRONIC 06/09/2008  . BENIGN PROSTATIC HYPERTROPHY 08/29/2006  . LUMBAR RADICULOPATHY, RIGHT 06/10/2007  . INSOMNIA-SLEEP DISORDER-UNSPEC 10/23/2007  . PACEMAKER, PERMANENT 03/04/2008    pt denies this date  . Left lumbar radiculopathy 05/30/2010  . Myocardial infarction 12/27/10    "I've had several MIs"  . Sleep apnea     "if I lay flat I quit breathing; HOB up I'm fine"  . Arthritis     "lower back; going back down both my sciatic nerves"  . Syncope and collapse 12/27/10  . Aortic root dilatation 02/02/2011    Family History  Problem Relation Age of Onset  . Diabetes Mother   . Diabetes Sister   . Heart disease Sister     2 sister died with heart disease  . Coronary artery disease Other 45    male, first degree relative  . Diabetes Other     1st degree relative  . Heart  disease Sister    History  Substance Use Topics  . Smoking status: Former Smoker -- 3.00 packs/day for 9 years    Types: Cigarettes    Quit date: 12/09/1970  . Smokeless tobacco: Former Systems developer  . Alcohol Use: No   Current Outpatient Prescriptions  Medication Sig Dispense Refill  . allopurinol (ZYLOPRIM) 300 MG tablet take 1 tablet by mouth once daily  90 tablet  3  . apixaban (ELIQUIS) 5 MG TABS tablet Take 1 tablet (5 mg total) by mouth 2 (two) times daily.  56 tablet  0  . atorvastatin (LIPITOR) 80 MG tablet Take 1 tablet (80 mg total) by mouth daily.  90 tablet  3  . carvedilol (COREG) 25 MG tablet Take 25 mg by mouth 2 (two) times daily.      . Cholecalciferol (VITAMIN D) 1000 UNITS capsule Take 1 capsule by mouth daily.      . fluticasone (FLONASE) 50 MCG/ACT nasal spray Place 2 sprays into both nostrils 2 (two) times daily.      . furosemide (LASIX) 40 MG tablet Take 1 tablet (40 mg total) by mouth daily.  90 tablet  3  . gabapentin (NEURONTIN) 300 MG capsule 2 tabs by mouth three times per day  60 capsule  0  . glipiZIDE (GLUCOTROL XL) 5 MG 24 hr tablet Take 1 tablet (5 mg total) by mouth daily with breakfast.  90 tablet  3  . glucose blood (ONE TOUCH ULTRA TEST)  test strip Use to check blood sugar twice a day Dx 250.00  180 each  3  . ibuprofen (ADVIL,MOTRIN) 200 MG tablet Take 800 mg by mouth daily as needed for mild pain or moderate pain.       Marland Kitchen lisinopril (PRINIVIL,ZESTRIL) 2.5 MG tablet Take 1 tablet (2.5 mg total) by mouth daily.  90 tablet  3  . metFORMIN (GLUCOPHAGE) 500 MG tablet Take 1,000 mg by mouth 2 (two) times daily.      . Multiple Vitamin (MULTIVITAMIN WITH MINERALS) TABS tablet Take 1 tablet by mouth every morning.      . potassium chloride SA (K-DUR,KLOR-CON) 20 MEQ tablet Take 1 tablet (20 mEq total) by mouth every evening.  90 tablet  3  . predniSONE (DELTASONE) 50 MG tablet Take 1 tablet (50 mg total) by mouth daily.  5 tablet  0  . sitaGLIPtin (JANUVIA) 100  MG tablet Take 100 mg by mouth every evening.      . tamsulosin (FLOMAX) 0.4 MG CAPS capsule Take 2 capsules by mouth every 12 (twelve) hours.      Marland Kitchen tiZANidine (ZANAFLEX) 4 MG tablet Take 1 tablet (4 mg total) by mouth every 6 (six) hours as needed for muscle spasms.  60 tablet  1  . traMADol (ULTRAM) 50 MG tablet Take 50 mg by mouth every 12 (twelve) hours as needed for moderate pain or severe pain.       No current facility-administered medications for this visit.   Allergies  Allergen Reactions  . Crestor [Rosuvastatin Calcium] Other (See Comments)    Urination of blood    Review of Systems: All systems reviewed and negative except where noted in HPI.    Physical Exam: BP 120/80  Pulse 68  Ht 5' 8.5" (1.74 m)  Wt 217 lb (98.431 kg)  BMI 32.51 kg/m2 Constitutional: Pleasant,well-developed, white male in no acute distress. HEENT: Normocephalic and atraumatic. Conjunctivae are normal. No scleral icterus. Neck supple.  Cardiovascular: Normal rate, regular rhythm.  Pulmonary/chest: Effort normal and breath sounds normal. No wheezing, rales or rhonchi. Abdominal: Soft, nondistended, nontender. Bowel sounds active throughout. There are no masses palpable. No hepatomegaly. Extremities: no edema Lymphadenopathy: No cervical adenopathy noted. Neurological: Alert and oriented to person place and time. Skin: Skin is warm and dry. No rashes noted. Psychiatric: Normal mood and affect. Behavior is normal.   ASSESSMENT AND PLAN:  69. 68 year old male with multiple, significant medical problems. He has a pacemaker , is on multiple medications including Eliquis  2. Two-three year history of intermittent solid food dysphagia, progressive. Overall his weight appears to be only 4 pounds down since June of this year. Patient is certainly at increased risk for procedures. Will obtain a barium swallow with tablet. Will call him with results and if abnormal will proceed with EGD with possible  dilation (if Eliquis can safely be held).   3. Colon cancer screening. Patient is appropriate age for screening colonoscopy but risk of the procedure may outweigh benefits given patient's significant comorbidities. It is reassuring that he has no [place] of colon cancer, no alarm symptoms. No history of anemia. We can discuss Cologuard or other form of colon cancer screening at his follow up visit.

## 2013-12-01 NOTE — Op Note (Signed)
[facility] [place], [address]   ENDOSCOPY PROCEDURE REPORT  PATIENT: Randall, Collier  MR#: [id number] BIRTHDATE: 03-04-1945 , 60  yrs. old GENDER: male ENDOSCOPIST: Lafayette Dragon, MD REFERRED BY:  Cathlean Cower, M.D. PROCEDURE DATE:  12/01/2013 PROCEDURE:  EGD w/ biopsy ASA CLASS:     Class III INDICATIONS:  dysphagia,  Barium esophagram shows frank aspiration. Barium esophagram not completed due to  aspiration, not sure if esophageal stricture.present.Patient chokes on liquids as well as solids History of gastroesophageal reflux,Eliquis on hold,. MEDICATIONS: Midazolam (Versed) 3 mg IV and Fentanyl 50 mcg IV TOPICAL ANESTHETIC: Cetacaine Spray  DESCRIPTION OF PROCEDURE: After the risks benefits and alternatives of the procedure were thoroughly explained, informed consent was obtained.  The [place] [id number] endoscope was introduced through the mouth and advanced to the second portion of the duodenum , Without limitations.  The instrument was slowly withdrawn as the mucosa was fully examined.    Esophagus: proximal mid and distal esophageal mucosa was normal. There were retained secretions in the esophagus. Patient was coughing up thick phlegm. Squamocolumnar junction was irregular but there was no stricture. biopsies were obtained from squamocolumnar junction to rule out Barrett's esophagus Stomach: there was no significant hiatal hernia. Gastric folds were normal. Gastric antrum and pyloric outlet were normal. Retroflexion of the endoscope revealed normal fundus and cardiaDuodenum: duodenal bulb and descending duodenum was normal Endoscope traversed into the stomach without difficulty.[          The scope was then withdrawn from the patient and the procedure completed.  COMPLICATIONS: There were no immediate complications.  ENDOSCOPIC IMPRESSION: 1. no evidence of esophageal stricture 2. Retained secretions in the esophagus  consistent with esophageal dysmotility 3. Status post biopsies from irregular appearing squamocolumnar junction  The suspected esophageal dysmotility and upper esophageal sphincter dysfunction are causing patient's aspiration  RECOMMENDATIONS:  antireflux measures Resume Eliquis Scheduled modified barium swallow with speech pathologist  REPEAT EXAM: for EGD pending biopsy results.  eSigned:  Lafayette Dragon, MD 12/01/2013 11:15 AM    BH:ALPFX Jacalyn Lefevre, MD  PATIENT NAME:  Randall, Collier MR#: [id number]

## 2013-12-01 NOTE — Discharge Instructions (Signed)
Esophagogastroduodenoscopy °Care After °Refer to this sheet in the next few weeks. These instructions provide you with information on caring for yourself after your procedure. Your caregiver may also give you more specific instructions. Your treatment has been planned according to current medical practices, but problems sometimes occur. Call your caregiver if you have any problems or questions after your procedure.  °HOME CARE INSTRUCTIONS °· Do not eat or drink anything until the numbing medicine (local anesthetic) has worn off and your gag reflex has returned. You will know that the local anesthetic has worn off when you can swallow comfortably. °· Do not drive for 12 hours after the procedure or as directed by your caregiver. °· Only take medicines as directed by your caregiver. °SEEK MEDICAL CARE IF:  °· You cannot stop coughing. °· You are not urinating at all or less than usual. °SEEK IMMEDIATE MEDICAL CARE IF: °· You have difficulty swallowing. °· You cannot eat or drink. °· You have worsening throat or chest pain. °· You have dizziness, lightheadedness, or you faint. °· You have nausea or vomiting. °· You have chills. °· You have a fever. °· You have severe abdominal pain. °· You have black, tarry, or bloody stools. °Document Released: 01/03/2012 Document Reviewed: 01/03/2012 °ExitCare® Patient Information ©2015 [employer]. This information is not intended to replace advice given to you by your health care provider. Make sure you discuss any questions you have with your health care provider. ° °

## 2013-12-02 ENCOUNTER — Encounter (HOSPITAL_COMMUNITY): Payer: Self-pay | Admitting: Internal Medicine

## 2013-12-03 ENCOUNTER — Encounter: Payer: Self-pay | Admitting: Internal Medicine

## 2013-12-04 ENCOUNTER — Telehealth: Payer: Self-pay | Admitting: *Deleted

## 2013-12-04 NOTE — Telephone Encounter (Signed)
Spoke with Abigail Butts at [facility] and scheduled MBSS on 12/10/13 at 11:00 AM at [facility] on first floor.. Patient notified.

## 2013-12-08 ENCOUNTER — Telehealth: Payer: Self-pay | Admitting: Internal Medicine

## 2013-12-08 NOTE — Telephone Encounter (Signed)
Left a message for [name] to call back.

## 2013-12-08 NOTE — Telephone Encounter (Signed)
Spoke with Delrae Rend and she will have patient keep the scheduled appointment.

## 2013-12-10 ENCOUNTER — Ambulatory Visit (HOSPITAL_COMMUNITY)
Admission: RE | Admit: 2013-12-10 | Discharge: 2013-12-10 | Disposition: A | Discharge status: Home / Self Care | Payer: Medicare HMO | Source: Ambulatory Visit | Attending: Internal Medicine | Admitting: Internal Medicine

## 2013-12-10 ENCOUNTER — Ambulatory Visit (HOSPITAL_COMMUNITY)
Admission: RE | Admit: 2013-12-10 | Discharge: 2013-12-10 | Disposition: A | Discharge status: Home / Self Care | Payer: Medicare HMO | Source: Ambulatory Visit | Attending: Nurse Practitioner | Admitting: Nurse Practitioner

## 2013-12-10 ENCOUNTER — Telehealth: Payer: Self-pay | Admitting: Internal Medicine

## 2013-12-10 DIAGNOSIS — R1313 Dysphagia, pharyngeal phase: Secondary | ICD-10-CM | POA: Diagnosis not present

## 2013-12-10 DIAGNOSIS — R1310 Dysphagia, unspecified: Secondary | ICD-10-CM | POA: Diagnosis present

## 2013-12-10 NOTE — Telephone Encounter (Signed)
Spoke with [name] and patient did go to [facility].

## 2013-12-10 NOTE — Procedures (Signed)
Objective Swallowing Evaluation: Modified Barium Swallowing Study  Patient Details  Name: [name] MRN: [id number] Date of Birth: [birth year]  Today's Date: 12/10/2013 Time: 0932-1005 SLP Time Calculation (min) (ACUTE ONLY): 33 min  Past Medical History:  Past Medical History  Diagnosis Date  . 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 DIABETES MELLITUS, TYPE II 08/29/2006  . HYPERLIPIDEMIA 08/29/2006  . GOUT 04/22/2007  . Overweight(278.02) 08/29/2006  . ERECTILE DYSFUNCTION 11/26/2006  . DEPRESSION 08/29/2006  . HYPERTENSION 08/29/2006  . CORONARY ARTERY DISEASE 08/29/2006  . 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 DIASTOLIC HEART FAILURE, CHRONIC 06/09/2008  . BENIGN PROSTATIC HYPERTROPHY 08/29/2006  . LUMBAR RADICULOPATHY, RIGHT 06/10/2007  . INSOMNIA-SLEEP DISORDER-UNSPEC 10/23/2007  . PACEMAKER, PERMANENT 03/04/2008    pt denies this date  . Left lumbar radiculopathy 05/30/2010  . Myocardial infarction 12/27/10    "I've had several MIs"  . Sleep apnea     "if I lay flat I quit breathing; HOB up I'm fine"  . Arthritis     "lower back; going back down both my sciatic nerves"  . Syncope and collapse 12/27/10  . Aortic root dilatation 02/02/2011   Past Surgical History:  Past Surgical History  Procedure Laterality Date  . S/p left arm surgury after work accident  1991    "2000# steel fell on it"  . S/p right hand surgury for foreign object  1970's    "piece of wood went in my hand; had to get that out"  . Insert / replace / remove pacemaker  ~ 2004    initial pacemaker placement  . Insert / replace / remove pacemaker  10/2009    generator change  . Coronary artery bypass graft  1992    CABG X 2  . Coronary angioplasty with stent placement  12/27/10    "I've had a total of 9 cardiac stents put in"  . Colonoscopy    . Esophagogastroduodenoscopy N/A 12/01/2013    Procedure: ESOPHAGOGASTRODUODENOSCOPY (EGD);  Surgeon: Lafayette Dragon, MD;  Location: [facility];  Service: Endoscopy;  Laterality: N/A;   HPI:  The pt is a 68 y.o. male with c/o difficulties swallowing solids, which began about 2 years ago. Pt has hx or reflux, no recent hx of PNA (had PNA as a child and in 38s, per pt). Had barium swallow last week which indicated esophageal dysmotility and UES dysfunction but no stricture. MBS recommended to objectively evaluate oropharyngeal function.     Assessment / Plan / Recommendation Clinical Impression  Dysphagia Diagnosis: Moderate pharyngeal phase dysphagia;Moderate cervical esophageal phase dysphagia Clinical impression: The pt presents with a moderate cervical esophageal/ pharyngeal phase dysphagia, characterized by a delay in swallow initiation with liquids to the level of the pyriforms and reduced CP segment opening, resulting in silent penetration to the laryngeal vestibule of thin and nectar-thick liquids, with one instance of penetration to the level of the vocal folds. Reduced CP segment opening consistent with findings of recent endoscopy on 11/2. Pt cleared the penetrated material when cued to clear throat. Pt independently swallowed material multiple times and followed cues to swallow even more times to clear residuals at the CP segment. Given these findings the pt is at moderate risk of aspiration. Recommend continuing regular/ thin liquid diet with utilization of the following strategies which were discussed with the pt: small bites/ sips, alternate food with liquid, clear throat intermittently, sit upright 30-60 minutes after meal, and smaller more frequent meals.  Provided handout with recommended strategies. Pt may consider visit to ENT for upper esophageal findings.     Treatment Recommendation       Diet Recommendation Regular;Thin liquid   Liquid Administration via: Cup;No straw Medication Administration: Whole meds with liquid Supervision: Patient able to self feed Compensations: Slow  rate;Small sips/bites;Multiple dry swallows after each bite/sip;Follow solids with liquid;Clear throat intermittently Postural Changes and/or Swallow Maneuvers: Seated upright 90 degrees;Upright 30-60 min after meal    Other  Recommendations Oral Care Recommendations: Oral care BID   Follow Up Recommendations       Frequency and Duration        Pertinent Vitals/Pain n/a    SLP Swallow Goals     General Date of Onset: 12/10/13 HPI: The pt is a 68 y.o. male with c/o difficulties swallowing solids, which began about 2 years ago. Pt has hx or reflux, no recent hx of PNA (had PNA as a child and in 84s, per pt). Had barium swallow last week which indicated esophageal dysmotility and UES dysfunction but no stricture. MBS recommended to objectively evaluate oropharyngeal function. Type of Study: Modified Barium Swallowing Study Reason for Referral: Objectively evaluate swallowing function Diet Prior to this Study: Regular;Thin liquids Temperature Spikes Noted: No Respiratory Status: Room air History of Recent Intubation: No Behavior/Cognition: Alert;Cooperative;Pleasant mood Oral Cavity - Dentition: Missing dentition Oral Motor / Sensory Function: Within functional limits Self-Feeding Abilities: Able to feed self Patient Positioning: Upright in chair Baseline Vocal Quality: Clear Volitional Cough: Strong Volitional Swallow: Able to elicit Anatomy: Within functional limits Pharyngeal Secretions: Not observed secondary MBS    Reason for Referral Objectively evaluate swallowing function   Oral Phase Oral Preparation/Oral Phase Oral Phase: WFL   Pharyngeal Phase Pharyngeal Phase Pharyngeal Phase: Impaired Pharyngeal - Nectar Pharyngeal - Nectar Cup: Delayed swallow initiation;Premature spillage to valleculae;Premature spillage to pyriform sinuses;Pharyngeal residue - valleculae;Pharyngeal residue - pyriform sinuses;Pharyngeal residue - cp segment;Penetration/Aspiration during  swallow Penetration/Aspiration details (nectar cup): Material enters airway, remains ABOVE vocal cords and not ejected out Pharyngeal - Thin Pharyngeal - Thin Cup: Delayed swallow initiation;Premature spillage to valleculae;Premature spillage to pyriform sinuses;Penetration/Aspiration during swallow;Pharyngeal residue - cp segment;Pharyngeal residue - valleculae;Pharyngeal residue - pyriform sinuses;Compensatory strategies attempted (Comment) Penetration/Aspiration details (thin cup): Material enters airway, CONTACTS cords and not ejected out Pharyngeal - Solids Pharyngeal - Puree: Premature spillage to valleculae;Pharyngeal residue - valleculae;Pharyngeal residue - pyriform sinuses;Pharyngeal residue - cp segment;Delayed swallow initiation Pharyngeal - Mechanical Soft: Delayed swallow initiation;Premature spillage to valleculae;Pharyngeal residue - valleculae;Pharyngeal residue - pyriform sinuses;Pharyngeal residue - cp segment  Cervical Esophageal Phase    GO    Cervical Esophageal Phase Cervical Esophageal Phase: Impaired Cervical Esophageal Phase - Nectar Nectar Cup: Reduced cricopharyngeal relaxation Cervical Esophageal Phase - Thin Thin Cup: Reduced cricopharyngeal relaxation Cervical Esophageal Phase - Solids Puree: Reduced cricopharyngeal relaxation Mechanical Soft: Reduced cricopharyngeal relaxation    Functional Assessment Tool Used: clinical judgment Functional Limitations: Swallowing Swallow Current Status (J2426): At least 20 percent but less than 40 percent impaired, limited or restricted Swallow Goal Status [phone]): At least 20 percent but less than 40 percent impaired, limited or restricted Swallow Discharge Status [phone]): At least 20 percent but less than 40 percent impaired, limited or restricted    Kern Reap, [place], CCC-SLP 12/10/2013, 1:02 PM

## 2013-12-11 ENCOUNTER — Ambulatory Visit
Admission: RE | Admit: 2013-12-11 | Discharge: 2013-12-11 | Disposition: A | Discharge status: Home / Self Care | Payer: Commercial Managed Care - HMO | Source: Ambulatory Visit | Attending: Family Medicine | Admitting: Family Medicine

## 2013-12-11 DIAGNOSIS — M545 Low back pain, unspecified: Secondary | ICD-10-CM

## 2013-12-11 MED ORDER — METHYLPREDNISOLONE ACETATE 40 MG/ML INJ SUSP (RADIOLOG
120.0000 mg | Freq: Once | INTRAMUSCULAR | Status: AC
  Administered 2013-12-11: 120 mg via EPIDURAL

## 2013-12-11 MED ORDER — IOHEXOL 180 MG/ML  SOLN
1.0000 mL | Freq: Once | INTRAMUSCULAR | Status: AC | PRN
  Administered 2013-12-11: 1 mL via EPIDURAL

## 2014-02-02 ENCOUNTER — Ambulatory Visit (INDEPENDENT_AMBULATORY_CARE_PROVIDER_SITE_OTHER): Payer: Medicare Other | Admitting: Family Medicine

## 2014-02-02 ENCOUNTER — Encounter: Payer: Self-pay | Admitting: Family Medicine

## 2014-02-02 VITALS — BP 132/68 | HR 89 | Ht 68.5 in | Wt 222.0 lb

## 2014-02-02 DIAGNOSIS — M5416 Radiculopathy, lumbar region: Secondary | ICD-10-CM | POA: Diagnosis not present

## 2014-02-02 DIAGNOSIS — M545 Low back pain, unspecified: Secondary | ICD-10-CM

## 2014-02-02 MED ORDER — METHYLPREDNISOLONE ACETATE 80 MG/ML IJ SUSP
80.0000 mg | Freq: Once | INTRAMUSCULAR | Status: AC
  Administered 2014-02-02: 80 mg via INTRAMUSCULAR

## 2014-02-02 MED ORDER — KETOROLAC TROMETHAMINE 60 MG/2ML IM SOLN
60.0000 mg | Freq: Once | INTRAMUSCULAR | Status: AC
  Administered 2014-02-02: 60 mg via INTRAMUSCULAR

## 2014-02-02 NOTE — Assessment & Plan Note (Signed)
Patient continues to have weakness on the right side. It seems to be any worse. Patient pain is affecting his daily activities. Patient is the primary caregiver for his wife who is significantly ill at this time. Patient is having difficulty sleeping. Patient is not responding to the pain medications and any stronger pain medications causes patient significant side effects. Patient is going to be referred to neurosurgery to know his options. Patient is willing to have surgery now secondary to the amount of pain and how this is affecting his daily activity. Discussed with patient at length that I'm here if he has any questions. Patient will continue all other medications.  Spent greater than 25 minutes with patient face-to-face and had greater than 50% of counseling including as described above in assessment and plan.

## 2014-02-02 NOTE — Patient Instructions (Signed)
Good to see you Dr. Gearldine Bienenstock office will be calling you.  Ice 20 minutes when you need it.  2 injections today.  Add a tylenol to the tramadol and the advil.  See me when you need me Happy New Year!
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
## 2014-02-03 ENCOUNTER — Telehealth: Payer: Self-pay | Admitting: Internal Medicine

## 2014-02-03 DIAGNOSIS — I483 Typical atrial flutter: Secondary | ICD-10-CM

## 2014-02-03 DIAGNOSIS — I5032 Chronic diastolic (congestive) heart failure: Secondary | ICD-10-CM

## 2014-02-03 MED ORDER — FUROSEMIDE 40 MG PO TABS: 40.0000 mg | ORAL_TABLET | Freq: Every day | ORAL | Status: DC

## 2014-02-03 MED ORDER — ALLOPURINOL 300 MG PO TABS: ORAL_TABLET | ORAL | Status: DC

## 2014-02-03 MED ORDER — SITAGLIPTIN PHOSPHATE 100 MG PO TABS: 100.0000 mg | ORAL_TABLET | Freq: Every evening | ORAL | Status: DC

## 2014-02-03 MED ORDER — ATORVASTATIN CALCIUM 80 MG PO TABS: 80.0000 mg | ORAL_TABLET | Freq: Every day | ORAL | Status: DC

## 2014-02-03 MED ORDER — POTASSIUM CHLORIDE CRYS ER 20 MEQ PO TBCR: 20.0000 meq | EXTENDED_RELEASE_TABLET | Freq: Every evening | ORAL | Status: DC

## 2014-02-03 MED ORDER — CARVEDILOL 25 MG PO TABS
25.0000 mg | ORAL_TABLET | Freq: Two times a day (BID) | ORAL | Status: DC
Start: 2014-02-03 — End: 2014-02-24

## 2014-02-03 MED ORDER — METFORMIN HCL 500 MG PO TABS: 1000.0000 mg | ORAL_TABLET | Freq: Two times a day (BID) | ORAL | Status: DC

## 2014-02-03 MED ORDER — LISINOPRIL 2.5 MG PO TABS: 2.5000 mg | ORAL_TABLET | Freq: Every day | ORAL | Status: DC

## 2014-02-03 MED ORDER — GLIPIZIDE ER 5 MG PO TB24: 5.0000 mg | ORAL_TABLET | Freq: Every day | ORAL | Status: DC

## 2014-02-03 NOTE — Telephone Encounter (Signed)
Pt needs all medication refills sent to Medicare [facility] EXCEPT BP medications, tramadol will continue to [facility] filled at [facility].

## 2014-02-05 ENCOUNTER — Other Ambulatory Visit: Payer: Self-pay | Admitting: Internal Medicine

## 2014-02-06 ENCOUNTER — Telehealth: Payer: Self-pay | Admitting: Cardiology

## 2014-02-06 ENCOUNTER — Telehealth: Payer: Self-pay | Admitting: Family Medicine

## 2014-02-06 NOTE — Telephone Encounter (Signed)
Pt called in stating that he has switched insurances this year and his Eliquis is no longer covered under his plan. He would like to know if there is another medication that he can take in the place of it. Please call  Thanks

## 2014-02-06 NOTE — Telephone Encounter (Signed)
I called this patient. He states w/ insurance changes the cost of Eliquis is prohibitive for him to be able to afford.  He has approx a 30 day supply right now, wanted to seek options for staying on this medication or switching to a more affordable medication if indicated.   I was told we may have programs available that can help. Will route to [facility] for advice.

## 2014-02-06 NOTE — Telephone Encounter (Signed)
Referral has been faxed to [facility]. They will contact pt directly to schedule appt. I spoke w/pt and he is aware.

## 2014-02-06 NOTE — Telephone Encounter (Signed)
Patient called stating that he was told to call if he hadnt heard anything by today for [place] neuro surgery referral. Per notes, it looks unworked at this point. Please advise.

## 2014-02-17 ENCOUNTER — Telehealth: Payer: Self-pay | Admitting: Internal Medicine

## 2014-02-17 ENCOUNTER — Telehealth: Payer: Self-pay | Admitting: Cardiology

## 2014-02-17 NOTE — Telephone Encounter (Signed)
Pt asked to speak to you. He said he had talked to you on the 8th of this month,about getting his Eliquis changed.

## 2014-02-17 NOTE — Telephone Encounter (Signed)
Pt would like to change from Eliquis to a more affordable medication. He is stating it will cost him $400 for a 90 day supply.  He has approx 1 week supply left.   Can we put him on something else?

## 2014-02-17 NOTE — Telephone Encounter (Signed)
Pt calling to let MD know all meds need to go to Medicare RX-- EXCEPT Tamsulosin, Tramadol, Eliquis [facility].

## 2014-02-17 NOTE — Telephone Encounter (Signed)
Ok , steph to put medicare rx as his pharmacy

## 2014-02-17 NOTE — Telephone Encounter (Signed)
Will need to know what his insurance will cover; otherwise may need to be coumadin. [name]

## 2014-02-17 NOTE — Telephone Encounter (Signed)
Pharmacy changed

## 2014-02-17 NOTE — Telephone Encounter (Signed)
Spoke with pt, he has a deductible to meet before the insurance will pay and he can not afford it. He wants to switch to coumadin. Discussed with kristin alvstad pharm md, patient will start coumadin 5 mg once daily with the eliquis for 3 days. After 3 days he will stop the eliquis and cont coumadin. He will see the [facility] on Monday.  After talking to the patient he is going to see a surgeon on Thursday this week, he has a pinched nerve in his back. He is going to call on Friday to let me know about surgery. He is aware we do not currently have any eliquis samples and if surgery is scheduled out he may need to make the change prior to surgery. Pt agreed with this plan.

## 2014-02-19 DIAGNOSIS — Z6830 Body mass index (BMI) 30.0-30.9, adult: Secondary | ICD-10-CM | POA: Diagnosis not present

## 2014-02-19 DIAGNOSIS — M5416 Radiculopathy, lumbar region: Secondary | ICD-10-CM | POA: Diagnosis not present

## 2014-02-23 ENCOUNTER — Telehealth: Payer: Self-pay | Admitting: Cardiology

## 2014-02-23 ENCOUNTER — Encounter: Payer: Medicare Other | Admitting: Pharmacist Clinician (PhC)/ Clinical Pharmacy Specialist

## 2014-02-23 DIAGNOSIS — I5032 Chronic diastolic (congestive) heart failure: Secondary | ICD-10-CM

## 2014-02-23 DIAGNOSIS — I483 Typical atrial flutter: Secondary | ICD-10-CM

## 2014-02-23 MED ORDER — APIXABAN 5 MG PO TABS: 5.0000 mg | ORAL_TABLET | Freq: Two times a day (BID) | ORAL | Status: DC

## 2014-02-23 MED ORDER — WARFARIN SODIUM 5 MG PO TABS: 5.0000 mg | ORAL_TABLET | Freq: Every day | ORAL | Status: DC

## 2014-02-23 NOTE — Telephone Encounter (Addendum)
Spoke with pt, aware no samples available. Script sent to the pharmacy.

## 2014-02-23 NOTE — Telephone Encounter (Signed)
Spoke with pt, he can not afford the eliquis and is going to switch over to the coumadin. Patient voiced understanding of how to take the eliquis and coumadin. Coumadin appt made for Friday this week.

## 2014-02-23 NOTE — Telephone Encounter (Signed)
Pt would like some samples of Eliqius please.

## 2014-02-23 NOTE — Telephone Encounter (Signed)
Pt called in stating that the wrong medication was called to the pharmacy. He stated that he needs a refill on his [facility]mad[facility] [place].   Thanks

## 2014-02-23 NOTE — Telephone Encounter (Signed)
See earlier telephone note--

## 2014-02-24 ENCOUNTER — Other Ambulatory Visit: Payer: Self-pay | Admitting: Internal Medicine

## 2014-02-24 ENCOUNTER — Encounter: Payer: Self-pay | Admitting: Internal Medicine

## 2014-02-24 ENCOUNTER — Other Ambulatory Visit (INDEPENDENT_AMBULATORY_CARE_PROVIDER_SITE_OTHER): Payer: Medicare Other

## 2014-02-24 ENCOUNTER — Ambulatory Visit (INDEPENDENT_AMBULATORY_CARE_PROVIDER_SITE_OTHER): Payer: Medicare Other | Admitting: Internal Medicine

## 2014-02-24 VITALS — BP 104/62 | HR 81 | Temp 97.9°F | Ht 68.0 in | Wt 215.2 lb

## 2014-02-24 DIAGNOSIS — E1140 Type 2 diabetes mellitus with diabetic neuropathy, unspecified: Secondary | ICD-10-CM | POA: Diagnosis not present

## 2014-02-24 DIAGNOSIS — Z0000 Encounter for general adult medical examination without abnormal findings: Secondary | ICD-10-CM

## 2014-02-24 DIAGNOSIS — I10 Essential (primary) hypertension: Secondary | ICD-10-CM | POA: Diagnosis not present

## 2014-02-24 DIAGNOSIS — E1165 Type 2 diabetes mellitus with hyperglycemia: Secondary | ICD-10-CM

## 2014-02-24 DIAGNOSIS — C61 Malignant neoplasm of prostate: Secondary | ICD-10-CM | POA: Diagnosis not present

## 2014-02-24 DIAGNOSIS — E785 Hyperlipidemia, unspecified: Secondary | ICD-10-CM

## 2014-02-24 DIAGNOSIS — E119 Type 2 diabetes mellitus without complications: Secondary | ICD-10-CM | POA: Diagnosis not present

## 2014-02-24 DIAGNOSIS — IMO0002 Reserved for concepts with insufficient information to code with codable children: Secondary | ICD-10-CM

## 2014-02-24 DIAGNOSIS — M5431 Sciatica, right side: Secondary | ICD-10-CM | POA: Diagnosis not present

## 2014-02-24 LAB — HEPATIC FUNCTION PANEL
ALBUMIN: 3.8 g/dL (ref 3.5–5.2)
ALK PHOS: 73 U/L (ref 39–117)
ALT: 28 U/L (ref 0–53)
AST: 23 U/L (ref 0–37)
Bilirubin, Direct: 0.2 mg/dL (ref 0.0–0.3)
TOTAL PROTEIN: 6.4 g/dL (ref 6.0–8.3)
Total Bilirubin: 0.5 mg/dL (ref 0.2–1.2)

## 2014-02-24 LAB — URINALYSIS, ROUTINE W REFLEX MICROSCOPIC
BILIRUBIN URINE: NEGATIVE
Hgb urine dipstick: NEGATIVE
KETONES UR: NEGATIVE
LEUKOCYTES UA: NEGATIVE
NITRITE: NEGATIVE
PH: 6 (ref 5.0–8.0)
RBC / HPF: NONE SEEN (ref 0–?)
Specific Gravity, Urine: 1.02 (ref 1.000–1.030)
Total Protein, Urine: NEGATIVE
Urine Glucose: 500 — AB
Urobilinogen, UA: 0.2 (ref 0.0–1.0)

## 2014-02-24 LAB — CBC WITH DIFFERENTIAL/PLATELET
BASOS ABS: 0 10*3/uL (ref 0.0–0.1)
Basophils Relative: 0.2 % (ref 0.0–3.0)
Eosinophils Absolute: 0.1 10*3/uL (ref 0.0–0.7)
Eosinophils Relative: 1.8 % (ref 0.0–5.0)
HCT: 38.3 % — ABNORMAL LOW (ref 39.0–52.0)
Hemoglobin: 12.7 g/dL — ABNORMAL LOW (ref 13.0–17.0)
Lymphocytes Relative: 21.2 % (ref 12.0–46.0)
Lymphs Abs: 1.4 10*3/uL (ref 0.7–4.0)
MCHC: 33.1 g/dL (ref 30.0–36.0)
MCV: 88.3 fl (ref 78.0–100.0)
MONO ABS: 0.5 10*3/uL (ref 0.1–1.0)
MONOS PCT: 7.2 % (ref 3.0–12.0)
Neutro Abs: 4.7 10*3/uL (ref 1.4–7.7)
Neutrophils Relative %: 69.6 % (ref 43.0–77.0)
Platelets: 161 10*3/uL (ref 150.0–400.0)
RBC: 4.34 Mil/uL (ref 4.22–5.81)
RDW: 15.3 % (ref 11.5–15.5)
WBC: 6.8 10*3/uL (ref 4.0–10.5)

## 2014-02-24 LAB — BASIC METABOLIC PANEL
BUN: 17 mg/dL (ref 6–23)
CALCIUM: 9.3 mg/dL (ref 8.4–10.5)
CHLORIDE: 104 meq/L (ref 96–112)
CO2: 27 meq/L (ref 19–32)
CREATININE: 0.8 mg/dL (ref 0.40–1.50)
GFR: 102.17 mL/min (ref >60.00)
Glucose, Bld: 189 mg/dL — ABNORMAL HIGH (ref 70–99)
Potassium: 4.6 mEq/L (ref 3.5–5.1)
SODIUM: 139 meq/L (ref 135–145)

## 2014-02-24 LAB — LIPID PANEL
CHOLESTEROL: 87 mg/dL (ref 0–200)
HDL: 37.6 mg/dL — ABNORMAL LOW (ref >39.00)
LDL Cholesterol: 39 mg/dL (ref 0–99)
NonHDL: 49.4
Total CHOL/HDL Ratio: 2
Triglycerides: 52 mg/dL (ref 0.0–149.0)
VLDL: 10.4 mg/dL (ref 0.0–40.0)

## 2014-02-24 LAB — TSH: TSH: 1.65 u[IU]/mL (ref 0.35–4.50)

## 2014-02-24 LAB — MICROALBUMIN / CREATININE URINE RATIO
CREATININE, U: 74.6 mg/dL
Microalb Creat Ratio: 0.9 mg/g (ref 0.0–30.0)

## 2014-02-24 LAB — PSA: PSA: 2.9 ng/mL (ref 0.10–4.00)

## 2014-02-24 LAB — HEMOGLOBIN A1C: Hgb A1c MFr Bld: 8.1 % — ABNORMAL HIGH (ref 4.6–6.5)

## 2014-02-24 MED ORDER — TIZANIDINE HCL 4 MG PO TABS: 4.0000 mg | ORAL_TABLET | Freq: Four times a day (QID) | ORAL | Status: DC | PRN

## 2014-02-24 MED ORDER — GLIPIZIDE ER 5 MG PO TB24: 5.0000 mg | ORAL_TABLET | Freq: Every day | ORAL | Status: DC

## 2014-02-24 MED ORDER — FUROSEMIDE 40 MG PO TABS: 40.0000 mg | ORAL_TABLET | Freq: Every day | ORAL | Status: DC

## 2014-02-24 MED ORDER — ATORVASTATIN CALCIUM 80 MG PO TABS: 80.0000 mg | ORAL_TABLET | Freq: Every day | ORAL | Status: DC

## 2014-02-24 MED ORDER — POTASSIUM CHLORIDE CRYS ER 20 MEQ PO TBCR: 20.0000 meq | EXTENDED_RELEASE_TABLET | Freq: Every evening | ORAL | Status: DC

## 2014-02-24 MED ORDER — GLIPIZIDE ER 10 MG PO TB24: 10.0000 mg | ORAL_TABLET | Freq: Every day | ORAL | Status: DC

## 2014-02-24 MED ORDER — SITAGLIPTIN PHOSPHATE 100 MG PO TABS: 100.0000 mg | ORAL_TABLET | Freq: Every day | ORAL | Status: DC

## 2014-02-24 MED ORDER — CARVEDILOL 25 MG PO TABS: 25.0000 mg | ORAL_TABLET | Freq: Two times a day (BID) | ORAL | Status: DC

## 2014-02-24 MED ORDER — LISINOPRIL 2.5 MG PO TABS: 2.5000 mg | ORAL_TABLET | Freq: Every day | ORAL | Status: DC

## 2014-02-24 MED ORDER — METFORMIN HCL 500 MG PO TABS: 1000.0000 mg | ORAL_TABLET | Freq: Two times a day (BID) | ORAL | Status: DC

## 2014-02-24 MED ORDER — TRAMADOL HCL 50 MG PO TABS: 50.0000 mg | ORAL_TABLET | Freq: Two times a day (BID) | ORAL | Status: DC | PRN

## 2014-02-24 NOTE — Patient Instructions (Addendum)

## 2014-02-24 NOTE — Progress Notes (Signed)
Subjective:    Patient ID: [name], male    DOB: [birth year], 69 y.o.   MRN: [id number]  HPI  Here for wellness and f/u;  Overall doing ok;  Pt denies CP, worsening SOB, DOE, wheezing, orthopnea, PND, worsening LE edema, palpitations, dizziness or syncope.  Pt denies neurological change such as new headache, facial or extremity weakness.  Pt denies polydipsia, polyuria, or low sugar symptoms. Pt states overall good compliance with treatment and medications, good tolerability, and has been trying to follow lower cholesterol diet.  Pt denies worsening depressive symptoms, suicidal ideation or panic. No fever, night sweats, wt loss, loss of appetite, or other constitutional symptoms.  Pt states good ability with ADL's, has low fall risk, home safety reviewed and adequate, no other significant changes in hearing or vision, and only occasionally active with exercise.  Walks with cane due to right LBP, for lumbar surgury next mo with  Dr Poole/NS.  Overall wt down from prior 250 to current.  Declines flu shot.  CBG's sometimes 190-200.  Denies worsening depressive symptoms, suicidal ideation, or panic; has ongoing anxiety and irritable,increased recently with taking care of invalid wife, has 6 hrs help during the weekdays only  Past Medical History  Diagnosis Date  . 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 DIABETES MELLITUS, TYPE II 08/29/2006  . HYPERLIPIDEMIA 08/29/2006  . GOUT 04/22/2007  . Overweight(278.02) 08/29/2006  . ERECTILE DYSFUNCTION 11/26/2006  . DEPRESSION 08/29/2006  . HYPERTENSION 08/29/2006  . CORONARY ARTERY DISEASE 08/29/2006  . 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 DIASTOLIC HEART FAILURE, CHRONIC 06/09/2008  . BENIGN PROSTATIC HYPERTROPHY 08/29/2006  . LUMBAR RADICULOPATHY, RIGHT 06/10/2007  . INSOMNIA-SLEEP DISORDER-UNSPEC 10/23/2007  . PACEMAKER, PERMANENT 03/04/2008    pt denies this date  . Left lumbar radiculopathy  05/30/2010  . Myocardial infarction 12/27/10    "I've had several MIs"  . Sleep apnea     "if I lay flat I quit breathing; HOB up I'm fine"  . Arthritis     "lower back; going back down both my sciatic nerves"  . Syncope and collapse 12/27/10  . Aortic root dilatation 02/02/2011   Past Surgical History  Procedure Laterality Date  . S/p left arm surgury after work accident  1991    "2000# steel fell on it"  . S/p right hand surgury for foreign object  1970's    "piece of wood went in my hand; had to get that out"  . Insert / replace / remove pacemaker  ~ 2004    initial pacemaker placement  . Insert / replace / remove pacemaker  10/2009    generator change  . Coronary artery bypass graft  1992    CABG X 2  . Coronary angioplasty with stent placement  12/27/10    "I've had a total of 9 cardiac stents put in"  . Colonoscopy    . Esophagogastroduodenoscopy N/A 12/01/2013    Procedure: ESOPHAGOGASTRODUODENOSCOPY (EGD);  Surgeon: Lafayette Dragon, MD;  Location: [facility];  Service: Endoscopy;  Laterality: N/A;    reports that he quit smoking about 43 years ago. His smoking use included Cigarettes. He has a 27 pack-year smoking history. He has quit using smokeless tobacco. He reports that he does not drink alcohol or use illicit drugs. family history includes Coronary artery disease (age of onset: 66) in his other; Diabetes in his mother, other, and sister; Heart disease in his sister and sister; Lung cancer  in his sister. Allergies  Allergen Reactions  . Crestor [Rosuvastatin Calcium] Other (See Comments)    Urination of blood   Current Outpatient Prescriptions on File Prior to Visit  Medication Sig Dispense Refill  . allopurinol (ZYLOPRIM) 300 MG tablet take 1 tablet by mouth once daily 90 tablet 3  . Cholecalciferol (VITAMIN D) 1000 UNITS capsule Take 1 capsule by mouth daily.    . cyclobenzaprine (FLEXERIL) 5 MG tablet as needed.    . fluticasone (FLONASE) 50 MCG/ACT nasal spray  Place 2 sprays into both nostrils 2 (two) times daily.    Marland Kitchen gabapentin (NEURONTIN) 300 MG capsule TAKE 2 CAPSULES BY MOUTH 3 TIMES A DAY 180 capsule 5  . glucose blood (ONE TOUCH ULTRA TEST) test strip Use to check blood sugar twice a day Dx 250.00 180 each 3  . ibuprofen (ADVIL,MOTRIN) 200 MG tablet Take 800 mg by mouth daily as needed for mild pain or moderate pain.     . Multiple Vitamin (MULTIVITAMIN WITH MINERALS) TABS tablet Take 1 tablet by mouth every morning.    . predniSONE (DELTASONE) 50 MG tablet Take 1 tablet (50 mg total) by mouth daily. 5 tablet 0  . sitaGLIPtin (JANUVIA) 100 MG tablet Take 1 tablet (100 mg total) by mouth every evening. 90 tablet 3  . tamsulosin (FLOMAX) 0.4 MG CAPS capsule Take 2 capsules by mouth every 12 (twelve) hours.    . traMADol (ULTRAM) 50 MG tablet Take 1 tablet (50 mg total) by mouth every 12 (twelve) hours as needed for moderate pain or severe pain. 60 tablet 1  . warfarin (COUMADIN) 5 MG tablet Take 1 tablet (5 mg total) by mouth daily. 90 tablet 3   No current facility-administered medications on file prior to visit.    Review of Systems Constitutional: Negative for increased diaphoresis, other activity, appetite or other siginficant weight change  HENT: Negative for worsening hearing loss, ear pain, facial swelling, mouth sores and neck stiffness.   Eyes: Negative for other worsening pain, redness or visual disturbance.  Respiratory: Negative for shortness of breath and wheezing.   Cardiovascular: Negative for chest pain and palpitations.  Gastrointestinal: Negative for diarrhea, blood in stool, abdominal distention or other pain Genitourinary: Negative for hematuria, flank pain or change in urine volume.  Musculoskeletal: Negative for myalgias or other joint complaints.  Skin: Negative for color change and wound.  Neurological: Negative for syncope and numbness. other than noted Hematological: Negative for adenopathy. or other  swelling Psychiatric/Behavioral: Negative for hallucinations, self-injury, decreased concentration or other worsening agitation.      Objective:   Physical Exam BP 104/62 mmHg  Pulse 81  Temp(Src) 97.9 F (36.6 C) (Oral)  Ht 5\' 8"  (1.727 m)  Wt 215 lb 4 oz (97.637 kg)  BMI 32.74 kg/m2  SpO2 96% VS noted,  Constitutional: Pt is oriented to person, place, and time. Appears well-developed and well-nourished.  Head: Normocephalic and atraumatic.  Right Ear: External ear normal.  Left Ear: External ear normal.  Nose: Nose normal.  Mouth/Throat: Oropharynx is clear and moist.  Eyes: Conjunctivae and EOM are normal. Pupils are equal, round, and reactive to light.  Neck: Normal range of motion. Neck supple. No JVD present. No tracheal deviation present.  Cardiovascular: Normal rate, regular rhythm, normal heart sounds and intact distal pulses.   Pulmonary/Chest: Effort normal and breath sounds without rales or wheezing  Abdominal: Soft. Bowel sounds are normal. NT. No HSM  Musculoskeletal: Normal range of motion. Exhibits no edema.  Lymphadenopathy:  Has no cervical adenopathy.  Neurological: Pt is alert and oriented to person, place, and time. Pt has normal reflexes. No cranial nerve deficit. Motor grossly intact Skin: Skin is warm and dry. No rash noted.  Psychiatric:  Has mild irritable mood and affect. Behavior is normal.  . Wt Readings from Last 3 Encounters:  02/24/14 215 lb 4 oz (97.637 kg)  02/02/14 222 lb (100.699 kg)  12/01/13 210 lb (95.255 kg)       Assessment & Plan:

## 2014-02-24 NOTE — Assessment & Plan Note (Signed)

## 2014-02-24 NOTE — Assessment & Plan Note (Signed)
stable overall by history and exam, recent data reviewed with pt, and pt to continue medical treatment as before,  to f/u any worsening symptoms or concerns Lab Results  Component Value Date   HGBA1C 8.0* 05/07/2013

## 2014-02-24 NOTE — Assessment & Plan Note (Signed)
Stable overall but persistent , for lumbar surgury soon per pt, for tramadol refill,  to f/u any worsening symptoms or concerns

## 2014-02-27 ENCOUNTER — Ambulatory Visit (INDEPENDENT_AMBULATORY_CARE_PROVIDER_SITE_OTHER): Payer: Medicare Other | Admitting: Pharmacist Clinician (PhC)/ Clinical Pharmacy Specialist

## 2014-02-27 ENCOUNTER — Encounter: Payer: Self-pay | Admitting: *Deleted

## 2014-02-27 ENCOUNTER — Telehealth: Payer: Self-pay | Admitting: *Deleted

## 2014-02-27 DIAGNOSIS — Z7901 Long term (current) use of anticoagulants: Secondary | ICD-10-CM

## 2014-02-27 DIAGNOSIS — I25710 Atherosclerosis of autologous vein coronary artery bypass graft(s) with unstable angina pectoris: Secondary | ICD-10-CM

## 2014-02-27 DIAGNOSIS — I483 Typical atrial flutter: Secondary | ICD-10-CM

## 2014-02-27 LAB — POCT INR: INR: 2.2

## 2014-02-27 NOTE — Telephone Encounter (Signed)
Spoke with pt, nuclear testing scheduled. Instructions discussed over the phone and mailed to the patient.

## 2014-02-27 NOTE — Telephone Encounter (Signed)
Patient is needing clearance for lumbar laminectomy. Per dr Stanford Breed he will need to have a lexiscan myoview for clearance. He can d/c coumadin 5 days prior to procedure. lovenox bridge pre and post (begin post op when ok with surgery)

## 2014-03-02 ENCOUNTER — Telehealth: Payer: Self-pay | Admitting: Cardiology

## 2014-03-02 NOTE — Telephone Encounter (Signed)
New message      Dr Marchelle Folks office is calling to find out the name of patient's device.

## 2014-03-02 NOTE — Telephone Encounter (Signed)
LMTCB/SSS 

## 2014-03-03 ENCOUNTER — Telehealth: Payer: Self-pay | Admitting: Internal Medicine

## 2014-03-03 MED ORDER — LISINOPRIL 2.5 MG PO TABS: 2.5000 mg | ORAL_TABLET | Freq: Every day | ORAL | Status: DC

## 2014-03-03 MED ORDER — ALLOPURINOL 300 MG PO TABS
ORAL_TABLET | ORAL | Status: DC
Start: 2014-03-03 — End: 2017-05-04

## 2014-03-03 MED ORDER — GLIPIZIDE ER 10 MG PO TB24: 10.0000 mg | ORAL_TABLET | Freq: Every day | ORAL | Status: DC

## 2014-03-03 MED ORDER — FUROSEMIDE 40 MG PO TABS: 40.0000 mg | ORAL_TABLET | Freq: Every day | ORAL | Status: DC

## 2014-03-03 MED ORDER — GLUCOSE BLOOD VI STRP: ORAL_STRIP | Status: DC

## 2014-03-03 MED ORDER — POTASSIUM CHLORIDE CRYS ER 20 MEQ PO TBCR: 20.0000 meq | EXTENDED_RELEASE_TABLET | Freq: Every evening | ORAL | Status: DC

## 2014-03-03 MED ORDER — ATORVASTATIN CALCIUM 80 MG PO TABS: 80.0000 mg | ORAL_TABLET | Freq: Every day | ORAL | Status: DC

## 2014-03-03 MED ORDER — CARVEDILOL 25 MG PO TABS: 25.0000 mg | ORAL_TABLET | Freq: Two times a day (BID) | ORAL | Status: DC

## 2014-03-03 MED ORDER — FLUTICASONE PROPIONATE 50 MCG/ACT NA SUSP: 2.0000 | Freq: Two times a day (BID) | NASAL | Status: DC

## 2014-03-03 MED ORDER — METFORMIN HCL 500 MG PO TABS
1000.0000 mg | ORAL_TABLET | Freq: Two times a day (BID) | ORAL | Status: DC
Start: 2014-03-03 — End: 2014-03-03

## 2014-03-03 MED ORDER — ALLOPURINOL 300 MG PO TABS: ORAL_TABLET | ORAL | Status: DC

## 2014-03-03 MED ORDER — TIZANIDINE HCL 4 MG PO TABS: 4.0000 mg | ORAL_TABLET | Freq: Four times a day (QID) | ORAL | Status: DC | PRN

## 2014-03-03 MED ORDER — METFORMIN HCL 500 MG PO TABS: 1000.0000 mg | ORAL_TABLET | Freq: Two times a day (BID) | ORAL | Status: DC

## 2014-03-03 NOTE — Telephone Encounter (Signed)
Pt request phone call from the assistant concern about medication. Please give him a call.

## 2014-03-03 NOTE — Telephone Encounter (Signed)
Patient spoke to pharmacy @ 1:40PM and they stated that they were having trouble with their receiving system. They are requesting that we call the scripts from today in. The phone # [phone] is for the mail order pharmacy.Marland KitchenMarland Kitchen

## 2014-03-03 NOTE — Telephone Encounter (Signed)
Called the patient to confirm correct mail order.  Resent prescriptions to [facility] mail order.  Patient had given the incorrect mail order at his [place].  Mail order is now correct.

## 2014-03-03 NOTE — Telephone Encounter (Signed)
Called the patient and resent prescriptions to new mail order pharmacy.

## 2014-03-03 NOTE — Telephone Encounter (Signed)
LMTCB

## 2014-03-04 ENCOUNTER — Telehealth (HOSPITAL_COMMUNITY): Payer: Self-pay

## 2014-03-04 ENCOUNTER — Ambulatory Visit (INDEPENDENT_AMBULATORY_CARE_PROVIDER_SITE_OTHER): Payer: Medicare Other | Admitting: Pharmacist Clinician (PhC)/ Clinical Pharmacy Specialist

## 2014-03-04 DIAGNOSIS — I483 Typical atrial flutter: Secondary | ICD-10-CM | POA: Diagnosis not present

## 2014-03-04 DIAGNOSIS — Z7901 Long term (current) use of anticoagulants: Secondary | ICD-10-CM | POA: Diagnosis not present

## 2014-03-04 LAB — POCT INR: INR: 4.6

## 2014-03-04 NOTE — Telephone Encounter (Signed)
Pt [name] w/ Dr. Trenton Gammon office pt information about device.

## 2014-03-04 NOTE — Telephone Encounter (Signed)
Encounter complete. 

## 2014-03-06 ENCOUNTER — Encounter (HOSPITAL_COMMUNITY): Payer: Medicare Other

## 2014-03-09 ENCOUNTER — Other Ambulatory Visit: Payer: Self-pay | Admitting: *Deleted

## 2014-03-09 ENCOUNTER — Telehealth: Payer: Self-pay | Admitting: Internal Medicine

## 2014-03-09 MED ORDER — GABAPENTIN 300 MG PO CAPS: 600.0000 mg | ORAL_CAPSULE | Freq: Three times a day (TID) | ORAL | Status: DC

## 2014-03-09 MED ORDER — LISINOPRIL 2.5 MG PO TABS: 2.5000 mg | ORAL_TABLET | Freq: Every day | ORAL | Status: DC

## 2014-03-09 MED ORDER — TAMSULOSIN HCL 0.4 MG PO CAPS: 0.8000 mg | ORAL_CAPSULE | Freq: Two times a day (BID) | ORAL | Status: DC

## 2014-03-09 NOTE — Telephone Encounter (Signed)
Needs a rush delivery on gabapentin and tamsulosin sent to [facility] [phone].  Patient states a request was sent over for these two and one more but the patient is not sure what script that was.

## 2014-03-09 NOTE — Telephone Encounter (Signed)
Refills sent on gabapentin & flomax...[name]

## 2014-03-10 ENCOUNTER — Other Ambulatory Visit: Payer: Self-pay | Admitting: Internal Medicine

## 2014-03-10 MED ORDER — TAMSULOSIN HCL 0.4 MG PO CAPS: 0.4000 mg | ORAL_CAPSULE | Freq: Two times a day (BID) | ORAL | Status: DC

## 2014-03-11 ENCOUNTER — Ambulatory Visit (INDEPENDENT_AMBULATORY_CARE_PROVIDER_SITE_OTHER): Payer: Medicare Other | Admitting: Pharmacist Clinician (PhC)/ Clinical Pharmacy Specialist

## 2014-03-11 ENCOUNTER — Ambulatory Visit: Payer: Medicare Other | Admitting: Pharmacist Clinician (PhC)/ Clinical Pharmacy Specialist

## 2014-03-11 ENCOUNTER — Ambulatory Visit (HOSPITAL_COMMUNITY)
Admission: RE | Admit: 2014-03-11 | Discharge: 2014-03-11 | Disposition: A | Discharge status: Home / Self Care | Payer: Medicare Other | Source: Ambulatory Visit | Attending: Cardiology | Admitting: Cardiology

## 2014-03-11 DIAGNOSIS — Z8249 Family history of ischemic heart disease and other diseases of the circulatory system: Secondary | ICD-10-CM | POA: Insufficient documentation

## 2014-03-11 DIAGNOSIS — E669 Obesity, unspecified: Secondary | ICD-10-CM | POA: Insufficient documentation

## 2014-03-11 DIAGNOSIS — E119 Type 2 diabetes mellitus without complications: Secondary | ICD-10-CM | POA: Diagnosis not present

## 2014-03-11 DIAGNOSIS — R0609 Other forms of dyspnea: Secondary | ICD-10-CM | POA: Diagnosis not present

## 2014-03-11 DIAGNOSIS — I10 Essential (primary) hypertension: Secondary | ICD-10-CM | POA: Diagnosis not present

## 2014-03-11 DIAGNOSIS — E785 Hyperlipidemia, unspecified: Secondary | ICD-10-CM | POA: Insufficient documentation

## 2014-03-11 DIAGNOSIS — Z87891 Personal history of nicotine dependence: Secondary | ICD-10-CM | POA: Diagnosis not present

## 2014-03-11 DIAGNOSIS — Z01818 Encounter for other preprocedural examination: Secondary | ICD-10-CM | POA: Insufficient documentation

## 2014-03-11 DIAGNOSIS — I25710 Atherosclerosis of autologous vein coronary artery bypass graft(s) with unstable angina pectoris: Secondary | ICD-10-CM | POA: Diagnosis not present

## 2014-03-11 DIAGNOSIS — Z7901 Long term (current) use of anticoagulants: Secondary | ICD-10-CM

## 2014-03-11 DIAGNOSIS — I483 Typical atrial flutter: Secondary | ICD-10-CM

## 2014-03-11 LAB — POCT INR: INR: 1.6

## 2014-03-11 MED ORDER — TECHNETIUM TC 99M SESTAMIBI GENERIC - CARDIOLITE
30.4000 | Freq: Once | INTRAVENOUS | Status: AC | PRN
  Administered 2014-03-11: 30 via INTRAVENOUS

## 2014-03-11 MED ORDER — REGADENOSON 0.4 MG/5ML IV SOLN
0.4000 mg | Freq: Once | INTRAVENOUS | Status: AC
  Administered 2014-03-11: 0.4 mg via INTRAVENOUS

## 2014-03-11 MED ORDER — TECHNETIUM TC 99M SESTAMIBI GENERIC - CARDIOLITE
10.6000 | Freq: Once | INTRAVENOUS | Status: AC | PRN
  Administered 2014-03-11: 11 via INTRAVENOUS

## 2014-03-11 NOTE — Procedures (Addendum)
[facility] [address] [place] [address] [phone]  Cardiology Nuclear Med Study  ZAK GONDEK is a 69 y.o. male     MRN : [id number]     DOB: [birth year]  Procedure Date: 03/11/2014  Nuclear Med Background Indication for Stress Test:  Surgical Clearance and Follow up CAD;Complete AVB History:  MI--multiple;CAD;Multiple STENT/PTCA;Pacemaker;Last NUC MPI on 08/29/2010-ischemia;EF=51%;CABG X2-1992;CHF;Carotid Bruit Cardiac Risk Factors: Carotid Disease, Family History - CAD, History of Smoking, Hypertension, Lipids, NIDDM and Obesity  Symptoms:  DOE and SOB   Nuclear Pre-Procedure Caffeine/Decaff Intake:  1:00am NPO After: 11am   IV Site: R Forearm  IV 0.9% NS with Angio Cath:  22g  Chest Size (in):  46"  IV Started by: Rolene Course, RN  Height: 5\' 8"  (1.727 m)  Cup Size: n/a  BMI:  Body mass index is 32.7 kg/(m^2). Weight:  215 lb (97.523 kg)   Tech Comments:  n/a    Nuclear Med Study 1 or 2 day study: 1 day  Stress Test Type:  [place]  Order Authorizing Provider:  [name], MD   Resting Radionuclide: Technetium 45m Sestamibi  Resting Radionuclide Dose: 10.6 mCi   Stress Radionuclide:  Technetium 36m Sestamibi  Stress Radionuclide Dose: 30.4 mCi           Stress Protocol Rest HR: 60 Stress HR: 62  Rest BP: 129/75 Stress BP: 123/73  Exercise Time (min): n/a METS: n/a   Predicted Max HR: 152 bpm % Max HR: 40.79 bpm Rate Pressure Product: 7998  Dose of Adenosine (mg):  n/a Dose of Lexiscan: 0.4 mg  Dose of Atropine (mg): n/a Dose of Dobutamine: n/a mcg/kg/min (at max HR)  Stress Test Technologist: Leane Para, CCT Nuclear Technologist: Imagene Riches, CNMT   Rest Procedure:  Myocardial perfusion imaging was performed at rest 45 minutes following the intravenous administration of Technetium 38m Sestamibi. Stress Procedure:  The patient received IV Lexiscan 0.4 mg over 15-seconds.  Technetium  21m Sestamibi injected IV at 30-seconds.  There were no significant changes with Lexiscan.  Quantitative spect images were obtained after a 45 minute delay.  Transient Ischemic Dilatation (Normal <1.22): 1.08  QGS EDV:  128 ml QGS ESV:  61 ml LV Ejection Fraction: 52%  PHYSICIAN INTERPRETATION  Rest ECG: Atrial Fibrilliation and V Paced Rhythm  Stress ECG: No significant change from baseline ECG and Uninteretable due to baseline LBBB  QPS Raw Data Images:  Normal; no motion artifact; normal heart/lung ratio. Stress Images:  There is decreased uptake in the apex.  Medium sized, moderate intensity fixed perfusion defect in the apical anterior & apical septal wall. Rest Images:  There is decreased uptake in the apex. Subtraction (SDS):  There is a fixed defect that is most consistent with a previous infarction. - vs. Pacemaker Wall Motion related artifact (similar to LBBB artifact).  No reversibility is appreciated.  No evidence of ischemia.  Impression Exercise Capacity:  Lexiscan with no exercise. BP Response:  Normal blood pressure response. Clinical Symptoms:  There is dyspnea. ECG Impression:  No significant ECG changes with Lexiscan.  Afib with V Paced Rhythm Comparison with Prior Nuclear Study: No images to compare  Overall Impression:  Low risk stress nuclear study with a Moderate fixed apical perfusion defect that is either consistent with prio infarction or V-Paced LBBB pattern.  .  The absence of ischemia and preserved EF would make this a Low Risk as opposed to Intermediate Risk study.  LV Wall Motion:  Pacemaker related septal wall motion abnormality. Also noted is apical akinesis. Low normal EF - likely underestimated.   Leonie Man, MD  03/11/2014 6:23 PM

## 2014-03-13 ENCOUNTER — Other Ambulatory Visit: Payer: Self-pay | Admitting: Neurosurgery

## 2014-03-17 ENCOUNTER — Encounter (HOSPITAL_COMMUNITY): Payer: Self-pay | Admitting: Pharmacy Technician

## 2014-03-18 ENCOUNTER — Ambulatory Visit (INDEPENDENT_AMBULATORY_CARE_PROVIDER_SITE_OTHER): Payer: Medicare Other | Admitting: Pharmacist Clinician (PhC)/ Clinical Pharmacy Specialist

## 2014-03-18 ENCOUNTER — Other Ambulatory Visit: Payer: Self-pay | Admitting: Pharmacist Clinician (PhC)/ Clinical Pharmacy Specialist

## 2014-03-18 DIAGNOSIS — I483 Typical atrial flutter: Secondary | ICD-10-CM

## 2014-03-18 DIAGNOSIS — Z7901 Long term (current) use of anticoagulants: Secondary | ICD-10-CM | POA: Diagnosis not present

## 2014-03-18 LAB — POCT INR: INR: 2.1

## 2014-03-18 MED ORDER — ENOXAPARIN SODIUM 100 MG/ML ~~LOC~~ SOLN: 100.0000 mg | Freq: Two times a day (BID) | SUBCUTANEOUS | Status: DC

## 2014-03-18 NOTE — Patient Instructions (Addendum)
Enoxaprin Dosing Schedule  Enoxparin dose:  Date  Warfarin Dose (evenings) Enoxaprin Dose  2-16 6    2-17 5 0   2-18 4 0 8am    8pm  2-19 3 0 8 am   8pm  2-20 2 0 8 am   8pm  2-21 1 0 8 am   2-22 Procedure 2.5mg  (1/2 tab)   2-23 1 5  mg (1 tab) 8 am   8pm  2-24 2 5  mg (1 tab) 8 am   8pm  2-25 3 5  mg (1 tab) 8 am   8pm  2-26 4 2.5 mg (1/2 tab) 8 am   8pm  2-27 5 2.5 mg (1/2 tab) 8 am   8pm  2-28 6 5  mg (1 tab)

## 2014-03-19 ENCOUNTER — Telehealth: Payer: Self-pay | Admitting: Cardiology

## 2014-03-19 ENCOUNTER — Telehealth: Payer: Self-pay | Admitting: *Deleted

## 2014-03-19 ENCOUNTER — Encounter (HOSPITAL_COMMUNITY)
Admission: RE | Admit: 2014-03-19 | Discharge: 2014-03-19 | Disposition: A | Discharge status: Home / Self Care | Payer: Medicare Other | Source: Ambulatory Visit | Attending: Neurosurgery | Admitting: Neurosurgery

## 2014-03-19 ENCOUNTER — Encounter (HOSPITAL_COMMUNITY): Payer: Self-pay

## 2014-03-19 DIAGNOSIS — M5126 Other intervertebral disc displacement, lumbar region: Secondary | ICD-10-CM | POA: Diagnosis not present

## 2014-03-19 DIAGNOSIS — Z01818 Encounter for other preprocedural examination: Secondary | ICD-10-CM | POA: Insufficient documentation

## 2014-03-19 HISTORY — DX: Dorsalgia, unspecified: M54.9

## 2014-03-19 HISTORY — DX: Polyneuropathy, unspecified: G62.9

## 2014-03-19 HISTORY — DX: Unspecified cataract: H26.9

## 2014-03-19 HISTORY — DX: Other muscle spasm: M62.838

## 2014-03-19 HISTORY — DX: Reserved for inherently not codable concepts without codable children: IMO0001

## 2014-03-19 HISTORY — DX: Personal history of other diseases of the nervous system and sense organs: Z86.69

## 2014-03-19 HISTORY — DX: Other chronic pain: G89.29

## 2014-03-19 LAB — SURGICAL PCR SCREEN
MRSA, PCR: NEGATIVE
Staphylococcus aureus: NEGATIVE

## 2014-03-19 LAB — CBC WITH DIFFERENTIAL/PLATELET
Basophils Absolute: 0 10*3/uL (ref 0.0–0.1)
Basophils Relative: 0 % (ref 0–1)
Eosinophils Absolute: 0.1 10*3/uL (ref 0.0–0.7)
Eosinophils Relative: 1 % (ref 0–5)
HCT: 36.9 % — ABNORMAL LOW (ref 39.0–52.0)
HEMOGLOBIN: 11.8 g/dL — AB (ref 13.0–17.0)
LYMPHS ABS: 1.5 10*3/uL (ref 0.7–4.0)
Lymphocytes Relative: 23 % (ref 12–46)
MCH: 28.6 pg (ref 26.0–34.0)
MCHC: 32 g/dL (ref 30.0–36.0)
MCV: 89.3 fL (ref 78.0–100.0)
MONO ABS: 0.3 10*3/uL (ref 0.1–1.0)
Monocytes Relative: 5 % (ref 3–12)
NEUTROS ABS: 4.4 10*3/uL (ref 1.7–7.7)
NEUTROS PCT: 71 % (ref 43–77)
Platelets: 163 10*3/uL (ref 150–400)
RBC: 4.13 MIL/uL — ABNORMAL LOW (ref 4.22–5.81)
RDW: 14.5 % (ref 11.5–15.5)
WBC: 6.3 10*3/uL (ref 4.0–10.5)

## 2014-03-19 LAB — BASIC METABOLIC PANEL
Anion gap: 5 (ref 5–15)
BUN: 13 mg/dL (ref 6–23)
CALCIUM: 9.2 mg/dL (ref 8.4–10.5)
CO2: 27 mmol/L (ref 19–32)
Chloride: 109 mmol/L (ref 96–112)
Creatinine, Ser: 0.77 mg/dL (ref 0.50–1.35)
GFR calc Af Amer: >90 mL/min (ref >90)
GFR calc non Af Amer: >90 mL/min (ref >90)
GLUCOSE: 167 mg/dL — AB (ref 70–99)
Potassium: 4.4 mmol/L (ref 3.5–5.1)
SODIUM: 141 mmol/L (ref 135–145)

## 2014-03-19 LAB — APTT: APTT: 36 s (ref 24–37)

## 2014-03-19 LAB — PROTIME-INR
INR: 1.03 (ref 0.00–1.49)
Prothrombin Time: 13.6 seconds (ref 11.6–15.2)

## 2014-03-19 NOTE — Telephone Encounter (Signed)
-----   Message from [name], MD sent at 03/18/2014  4:55 PM EST ----- Patient declines Lovenox and instead prefers admission to the hospital for IV heparin pre-and postoperatively. Would notify his surgeon. He should discontinue Coumadin 2 days prior to admission and then be placed on IV heparin until surgery. IV heparin/coumadin should be resumed postoperatively when okay with surgery and continued until INR approximately 2. [name]  ----- Message -----    From: Tommy Medal, RPH-CPP    Sent: 03/18/2014   4:44 PM      To: [name], MD  He'll take the hospital and IV heparin.  I told him you'd have Hilda Blades let him know what the arrangements are.  Procedure is set to be at [facility].  He's going over tomorrow for pre-procedure labs/paperwork.  INR today was 2.1  Erasmo Downer ----- Message -----    From: [name], MD    Sent: 03/18/2014   1:24 PM      To: Tommy Medal, RPH-CPP  No other option unless he wants to be admitted for IV heparin [name]  ----- Message -----    From: Tommy Medal, RPH-CPP    Sent: 03/18/2014   1:19 PM      To: Fredia Beets, RN, [name], MD  Pt needs lovenox bridge for spinal surgery set for 03/23/14.  Cost of generic lovenox for patient is $480.  He cannot afford.    Any thoughts?  Erasmo Downer

## 2014-03-19 NOTE — Pre-Procedure Instructions (Signed)
[name]  03/19/2014   Your procedure is scheduled on:  Mon, Feb 22 @ 11:05 AM  Report to [facility]  at 9:00 AM.  Call this number if you have problems the morning of surgery: [phone]   Remember:   Do not eat food or drink liquids after midnight.   Take these medicines the morning of surgery with A SIP OF WATER: Allopurinol(Zyloprim),Carvedilol(Coreg),Flonase(Fluticasone),Gabapentin(Neurontin),Flomax(Tamsulosin),and Tramadol(Ultram)                Stop taking your Coumadin as you have been instructed. Also stop taking Ibuprofen,Aspirin,Goody's,BC's,Aleve,Fish Oil,or any Herbal Medications.   Do not wear jewelry  Do not wear lotions, powders, or colognes. You may wear deodorant.  Men may shave face and neck.  Do not bring valuables to the hospital.  [facility] is not responsible                  for any belongings or valuables.               Contacts, dentures or bridgework may not be worn into surgery.  Leave suitcase in the car. After surgery it may be brought to your room.  For patients admitted to the hospital, discharge time is determined by your                treatment team.                  Special Instructions:  [facility] - Preparing for Surgery  Before surgery, you can play an important role.  Because skin is not sterile, your skin needs to be as free of germs as possible.  You can reduce the number of germs on you skin by washing with CHG (chlorahexidine gluconate) soap before surgery.  CHG is an antiseptic cleaner which kills germs and bonds with the skin to continue killing germs even after washing.  Please DO NOT use if you have an allergy to CHG or antibacterial soaps.  If your skin becomes reddened/irritated stop using the CHG and inform your nurse when you arrive at Short Stay.  Do not shave (including legs and underarms) for at least 48 hours prior to the first CHG shower.  You may shave your face.  Please follow these instructions  carefully:   1.  Shower with CHG Soap the night before surgery and the                                morning of Surgery.  2.  If you choose to wash your hair, wash your hair first as usual with your       normal shampoo.  3.  After you shampoo, rinse your hair and body thoroughly to remove the                      Shampoo.  4.  Use CHG as you would any other liquid soap.  You can apply chg directly       to the skin and wash gently with scrungie or a clean washcloth.  5.  Apply the CHG Soap to your body ONLY FROM THE NECK DOWN.        Do not use on open wounds or open sores.  Avoid contact with your eyes,       ears, mouth and genitals (private parts).  Wash genitals (private parts)  with your normal soap.  6.  Wash thoroughly, paying special attention to the area where your surgery        will be performed.  7.  Thoroughly rinse your body with warm water from the neck down.  8.  DO NOT shower/wash with your normal soap after using and rinsing off       the CHG Soap.  9.  Pat yourself dry with a clean towel.            10.  Wear clean pajamas.            11.  Place clean sheets on your bed the night of your first shower and do not        sleep with pets.  Day of Surgery  Do not apply any lotions/deoderants the morning of surgery.  Please wear clean clothes to the hospital/surgery center.     Please read over the following fact sheets that you were given: Pain Booklet, Coughing and Deep Breathing, MRSA Information and Surgical Site Infection Prevention

## 2014-03-19 NOTE — Progress Notes (Signed)
Cardiologist is Dr.Crenshaw-clearance in epic   Multiple echo reports in epic   Stress test in epic and most recent in chart  Multiple heart cath reports in epic  EKG in epic from 11-27-13  Dr.James Jenny Reichmann is Medical Md

## 2014-03-19 NOTE — Telephone Encounter (Signed)
Spoke with pt, aware he is to continue taking coumadin until he hear from dr poole's office regarding admission for heprin. See previous telephone notes.

## 2014-03-19 NOTE — Telephone Encounter (Signed)
Spoke with vanessa, Aware of dr Jacalyn Lefevre recommendations.  This note will be faxed to [phone]

## 2014-03-19 NOTE — Progress Notes (Signed)
Spoke with Marcia,rep with [employer] and notified of upcoming surgery with date and time.She will be here for surgery.

## 2014-03-19 NOTE — Progress Notes (Signed)
Pt states if PCR comes back positive do not call into a pharmacy bc he can't afford to get it

## 2014-03-19 NOTE — Progress Notes (Signed)
Pt states that he was supposed to start the Lovenox bridge today but can't afford it. States he notified Dr.Crenshaw's office who will be in touch with Dr.Pool to have pt admitted prior to surgery.

## 2014-03-20 ENCOUNTER — Ambulatory Visit: Payer: Medicare Other | Admitting: Pharmacist Clinician (PhC)/ Clinical Pharmacy Specialist

## 2014-03-20 ENCOUNTER — Other Ambulatory Visit: Payer: Self-pay | Admitting: Neurosurgery

## 2014-03-20 ENCOUNTER — Encounter (HOSPITAL_COMMUNITY)
Admission: RE | Admit: 2014-03-20 | Discharge: 2014-03-20 | Disposition: A | Discharge status: Home / Self Care | Payer: Medicare Other | Source: Ambulatory Visit | Attending: Neurosurgery | Admitting: Neurosurgery

## 2014-03-20 DIAGNOSIS — M5126 Other intervertebral disc displacement, lumbar region: Secondary | ICD-10-CM | POA: Insufficient documentation

## 2014-03-20 MED ORDER — ENOXAPARIN SODIUM 100 MG/ML ~~LOC~~ SOLN
100.0000 mg | Freq: Two times a day (BID) | SUBCUTANEOUS | Status: DC
  Administered 2014-03-20: 100 mg via SUBCUTANEOUS
  Filled 2014-03-20 (×4): qty 1

## 2014-03-20 MED ORDER — ENOXAPARIN SODIUM 150 MG/ML ~~LOC~~ SOLN: 1.0000 mg/kg | Freq: Two times a day (BID) | SUBCUTANEOUS | Status: DC

## 2014-03-20 NOTE — Discharge Instructions (Signed)
See appointment schedule for instructions about going to ADMITTING at 8AM on Saturday and Sunday and ER ADMITTING at 8pm on Saturday. Follow instructions given in admitting department if they are different from these instructions   lovenox Enoxaparin injection What is this medicine? ENOXAPARIN (ee nox a PA rin) is used after knee, hip, or abdominal surgeries to prevent blood clotting. It is also used to treat existing blood clots in the lungs or in the veins. This medicine may be used for other purposes; ask your health care provider or pharmacist if you have questions. COMMON BRAND NAME(S): Lovenox What should I tell my health care provider before I take this medicine? They need to know if you have any of these conditions: -bleeding disorders, hemorrhage, or hemophilia -infection of the heart or heart valves -kidney or liver disease -previous stroke -prosthetic heart valve -recent surgery or delivery of a baby -ulcer in the stomach or intestine, diverticulitis, or other bowel disease -an unusual or allergic reaction to enoxaparin, heparin, pork or pork products, other medicines, foods, dyes, or preservatives -pregnant or trying to get pregnant -breast-feeding How should I use this medicine? This medicine is for injection under the skin. It is usually given by a health-care professional. You or a family member may be trained on how to give the injections. If you are to give yourself injections, make sure you understand how to use the syringe, measure the dose if necessary, and give the injection. To avoid bruising, do not rub the site where this medicine has been injected. Do not take your medicine more often than directed. Do not stop taking except on the advice of your doctor or health care professional. Make sure you receive a puncture-resistant container to dispose of the needles and syringes once you have finished with them. Do not reuse these items. Return the container to your doctor or  health care professional for proper disposal. Talk to your pediatrician regarding the use of this medicine in children. Special care may be needed. Overdosage: If you think you have taken too much of this medicine contact a poison control center or emergency room at once. NOTE: This medicine is only for you. Do not share this medicine with others. What if I miss a dose? If you miss a dose, take it as soon as you can. If it is almost time for your next dose, take only that dose. Do not take double or extra doses. What may interact with this medicine? -aspirin and aspirin-like medicines -certain medicines that treat or prevent blood clots -dipyridamole -NSAIDs, medicines for pain and inflammation, like ibuprofen or naproxen This list may not describe all possible interactions. Give your health care provider a list of all the medicines, herbs, non-prescription drugs, or dietary supplements you use. Also tell them if you smoke, drink alcohol, or use illegal drugs. Some items may interact with your medicine. What should I watch for while using this medicine? Visit your doctor or health care professional for regular checks on your progress. Your condition will be monitored carefully while you are receiving this medicine. Notify your doctor or health care professional and seek emergency treatment if you develop breathing problems; changes in vision; chest pain; severe, sudden headache; pain, swelling, warmth in the leg; trouble speaking; sudden numbness or weakness of the face, arm, or leg. These can be signs that your condition has gotten worse. If you are going to have surgery, tell your doctor or health care professional that you are taking this medicine. Do  not stop taking this medicine without first talking to your doctor. Be sure to refill your prescription before you run out of medicine. Avoid sports and activities that might cause injury while you are using this medicine. Severe falls or injuries  can cause unseen bleeding. Be careful when using sharp tools or knives. Consider using an Copy. Take special care brushing or flossing your teeth. Report any injuries, bruising, or red spots on the skin to your doctor or health care professional. What side effects may I notice from receiving this medicine? Side effects that you should report to your doctor or health care professional as soon as possible: -allergic reactions like skin rash, itching or hives, swelling of the face, lips, or tongue -feeling faint or lightheaded, falls -signs and symptoms of bleeding such as bloody or black, tarry stools; red or dark-brown urine; spitting up blood or brown material that looks like coffee grounds; red spots on the skin; unusual bruising or bleeding from the eye, gums, or nose Side effects that usually do not require medical attention (report to your doctor or health care professional if they continue or are bothersome): -pain, redness, or irritation at site where injected This list may not describe all possible side effects. Call your doctor for medical advice about side effects. You may report side effects to FDA at 1-800-FDA-1088. Where should I keep my medicine? Keep out of the reach of children. Store at room temperature between 15 and 30 degrees C (59 and 86 degrees F). Do not freeze. If your injections have been specially prepared, you may need to store them in the refrigerator. Ask your pharmacist. Throw away any unused medicine after the expiration date. NOTE: This sheet is a summary. It may not cover all possible information. If you have questions about this medicine, talk to your doctor, pharmacist, or health care provider.  2015, Elsevier/Gold Standard. (2013-05-20 16:06:21)

## 2014-03-20 NOTE — Progress Notes (Signed)
Anesthesia Chart Review:  Patient is a 69 year old male scheduled for right L3-4, L4-5, L5-S1 laminectomy and foraminotomy on 03/23/14 by Dr. Annette Stable.    History includes former smoker, CAD/MI s/p CABG '92, CHB s/p [employer] PPM, aflutter on warfarin, chronic diastolic CHF, DM2, peripheral neuropathy, prostate cancer, HLD, depression, migraines, OSA. PCP is Dr. Cathlean Cower.   Cardiologist is Dr. [name] who felt patient was "[facility] for surgery"  following a recent stress .  EP cardiologist is Dr. Lovena Le. Dr. Stanford Breed recommended a Lovenox bridge.  Notes in Epic initially indicated that patient could not afford Lovenox and was considering admission for IV heparin. However, there are now multiple appointments with [facility] for Lovenox injections scheduled. (I confirmed this Lorriane Shire at Dr. Marchelle Folks office.)   EKG 11/27/13: V-paced rhythm with underlying a-flutter at 81 bpm.  03/11/14 nuclear stress test: Overall Impression: Low risk stress nuclear study with a Moderate fixed apical perfusion defect that is either consistent with prio infarction or V-Paced LBBB pattern. . The absence of ischemia and preserved EF would make this a Low Risk as opposed to Intermediate Risk study. LV Wall Motion: Pacemaker related septal wall motion abnormality. Also noted is apical akinesis. Low normal EF - likely underestimated. EF 52%.  09/10/13 Echo:  - Left ventricle: The cavity size was normal. Wall thickness was increased in a pattern of mild LVH. Systolic function was normal. The estimated ejection fraction was in the range of 55% to 60%. Wall motion was normal; there were no regional wall motion abnormalities. - Left atrium: The atrium was mildly dilated. - Atrial septum: No defect or patent foramen ovale was identified.  Last cardiac catheterization on 06/15/08 showed a normal left main, a 50-70% circumflex, a totally occluded LAD, and a focal 70% within the stent in the mid right coronary artery. The  saphenous vein graft to the circumflex was occluded (old). The LIMA to the LAD was patent with patent stents in the mid and distal LAD. Ejection fraction was 60%. Normal LV filling pressures. The RCA lesion was felt slightly worse than before and probably not flow limiting. Myoview on 06/24/08 showed no significant ischemia and medical therapy was continued.   Normal abdominal aortic aneurysm duplex on 08/26/12.  Preoperative labs noted.  INR is already subtherapeutic.  He will be on Lovenox bridging with last dose scheduled for 8AM on 03/22/14, so I will not plan to repeat labs preoperatively.  If no acute changes then I would anticipate that he could proceed as planned.  [facility] Short Stay Center/Anesthesiology Phone [phone] 03/20/2014 1:27 PM

## 2014-03-21 ENCOUNTER — Encounter (HOSPITAL_COMMUNITY)
Admission: RE | Admit: 2014-03-21 | Discharge: 2014-03-21 | Disposition: A | Discharge status: Home / Self Care | Payer: Medicare Other | Source: Ambulatory Visit | Attending: Neurosurgery | Admitting: Neurosurgery

## 2014-03-21 DIAGNOSIS — M5126 Other intervertebral disc displacement, lumbar region: Secondary | ICD-10-CM | POA: Diagnosis not present

## 2014-03-21 MED ORDER — ENOXAPARIN SODIUM 100 MG/ML ~~LOC~~ SOLN
100.0000 mg | Freq: Two times a day (BID) | SUBCUTANEOUS | Status: DC
  Administered 2014-03-21 (×2): 100 mg via SUBCUTANEOUS
  Filled 2014-03-21 (×5): qty 1

## 2014-03-21 NOTE — Progress Notes (Signed)
Discharged to personal auto via wheelchair. No C/O. Pt encouraged to arrive on unit in am at 0730 to facilitate faster medication administration. Pt agreeable.

## 2014-03-22 ENCOUNTER — Encounter (HOSPITAL_COMMUNITY): Payer: Medicare Other

## 2014-03-22 ENCOUNTER — Encounter (HOSPITAL_COMMUNITY)
Admission: RE | Admit: 2014-03-22 | Discharge: 2014-03-22 | Disposition: A | Discharge status: Home / Self Care | Payer: Medicare Other | Source: Ambulatory Visit | Attending: Neurosurgery | Admitting: Neurosurgery

## 2014-03-22 DIAGNOSIS — M5126 Other intervertebral disc displacement, lumbar region: Secondary | ICD-10-CM | POA: Diagnosis not present

## 2014-03-22 MED ORDER — DEXAMETHASONE SODIUM PHOSPHATE 10 MG/ML IJ SOLN: 10.0000 mg | INTRAMUSCULAR | Status: DC

## 2014-03-22 MED ORDER — ENOXAPARIN SODIUM 100 MG/ML ~~LOC~~ SOLN
1.0000 mg/kg | Freq: Once | SUBCUTANEOUS | Status: AC
  Administered 2014-03-22: 100 mg via SUBCUTANEOUS
  Filled 2014-03-22: qty 1

## 2014-03-22 MED ORDER — CEFAZOLIN SODIUM-DEXTROSE 2-3 GM-% IV SOLR: 2.0000 g | INTRAVENOUS | Status: DC

## 2014-03-23 ENCOUNTER — Inpatient Hospital Stay (HOSPITAL_COMMUNITY): Payer: Medicare Other | Admitting: Vascular Surgery

## 2014-03-23 ENCOUNTER — Encounter (HOSPITAL_COMMUNITY)
Admission: RE | Disposition: A | Discharge status: Home / Self Care | Payer: Self-pay | Source: Ambulatory Visit | Attending: Neurosurgery

## 2014-03-23 ENCOUNTER — Inpatient Hospital Stay (HOSPITAL_COMMUNITY): Payer: Medicare Other

## 2014-03-23 ENCOUNTER — Encounter (HOSPITAL_COMMUNITY): Payer: Self-pay | Admitting: Surgery

## 2014-03-23 ENCOUNTER — Observation Stay (HOSPITAL_COMMUNITY)
Admission: RE | Admit: 2014-03-23 | Discharge: 2014-03-25 | Disposition: A | Discharge status: Home / Self Care | Payer: Medicare Other | Source: Ambulatory Visit | Attending: Neurosurgery | Admitting: Neurosurgery

## 2014-03-23 ENCOUNTER — Inpatient Hospital Stay (HOSPITAL_COMMUNITY): Payer: Medicare Other | Admitting: Anesthesiology

## 2014-03-23 DIAGNOSIS — Z419 Encounter for procedure for purposes other than remedying health state, unspecified: Secondary | ICD-10-CM

## 2014-03-23 DIAGNOSIS — M4726 Other spondylosis with radiculopathy, lumbar region: Secondary | ICD-10-CM | POA: Diagnosis not present

## 2014-03-23 DIAGNOSIS — I739 Peripheral vascular disease, unspecified: Secondary | ICD-10-CM | POA: Insufficient documentation

## 2014-03-23 DIAGNOSIS — Z7901 Long term (current) use of anticoagulants: Secondary | ICD-10-CM

## 2014-03-23 DIAGNOSIS — M1990 Unspecified osteoarthritis, unspecified site: Secondary | ICD-10-CM | POA: Diagnosis not present

## 2014-03-23 DIAGNOSIS — I5032 Chronic diastolic (congestive) heart failure: Secondary | ICD-10-CM | POA: Diagnosis present

## 2014-03-23 DIAGNOSIS — Z87891 Personal history of nicotine dependence: Secondary | ICD-10-CM

## 2014-03-23 DIAGNOSIS — E119 Type 2 diabetes mellitus without complications: Secondary | ICD-10-CM | POA: Diagnosis not present

## 2014-03-23 DIAGNOSIS — F329 Major depressive disorder, single episode, unspecified: Secondary | ICD-10-CM | POA: Diagnosis not present

## 2014-03-23 DIAGNOSIS — I10 Essential (primary) hypertension: Secondary | ICD-10-CM | POA: Diagnosis not present

## 2014-03-23 DIAGNOSIS — M48062 Spinal stenosis, lumbar region with neurogenic claudication: Secondary | ICD-10-CM | POA: Diagnosis present

## 2014-03-23 DIAGNOSIS — Z9889 Other specified postprocedural states: Secondary | ICD-10-CM | POA: Diagnosis not present

## 2014-03-23 DIAGNOSIS — M5126 Other intervertebral disc displacement, lumbar region: Secondary | ICD-10-CM | POA: Diagnosis not present

## 2014-03-23 DIAGNOSIS — Z950 Presence of cardiac pacemaker: Secondary | ICD-10-CM | POA: Insufficient documentation

## 2014-03-23 DIAGNOSIS — I252 Old myocardial infarction: Secondary | ICD-10-CM | POA: Diagnosis not present

## 2014-03-23 DIAGNOSIS — G4730 Sleep apnea, unspecified: Secondary | ICD-10-CM | POA: Insufficient documentation

## 2014-03-23 DIAGNOSIS — Z955 Presence of coronary angioplasty implant and graft: Secondary | ICD-10-CM | POA: Diagnosis not present

## 2014-03-23 DIAGNOSIS — E785 Hyperlipidemia, unspecified: Secondary | ICD-10-CM | POA: Insufficient documentation

## 2014-03-23 DIAGNOSIS — M4806 Spinal stenosis, lumbar region: Principal | ICD-10-CM | POA: Diagnosis present

## 2014-03-23 HISTORY — DX: Presence of cardiac pacemaker: Z95.0

## 2014-03-23 HISTORY — PX: LUMBAR LAMINECTOMY/DECOMPRESSION MICRODISCECTOMY: SHX5026

## 2014-03-23 LAB — GLUCOSE, CAPILLARY: GLUCOSE-CAPILLARY: 172 mg/dL — AB (ref 70–99)

## 2014-03-23 SURGERY — LUMBAR LAMINECTOMY/DECOMPRESSION MICRODISCECTOMY 3 LEVELS
Anesthesia: General | Site: Spine Lumbar | Laterality: Right

## 2014-03-23 MED ORDER — ACETAMINOPHEN 650 MG RE SUPP: 650.0000 mg | RECTAL | Status: DC | PRN

## 2014-03-23 MED ORDER — HYDROMORPHONE HCL 1 MG/ML IJ SOLN: 0.5000 mg | INTRAMUSCULAR | Status: DC | PRN

## 2014-03-23 MED ORDER — LACTATED RINGERS IV SOLN
INTRAVENOUS | Status: DC | PRN
  Administered 2014-03-23 (×2): via INTRAVENOUS

## 2014-03-23 MED ORDER — ACETAMINOPHEN 325 MG PO TABS: 650.0000 mg | ORAL_TABLET | ORAL | Status: DC | PRN

## 2014-03-23 MED ORDER — MEPERIDINE HCL 25 MG/ML IJ SOLN: 6.2500 mg | INTRAMUSCULAR | Status: DC | PRN

## 2014-03-23 MED ORDER — TIZANIDINE HCL 4 MG PO TABS: 4.0000 mg | ORAL_TABLET | Freq: Four times a day (QID) | ORAL | Status: DC | PRN

## 2014-03-23 MED ORDER — HYDROCODONE-ACETAMINOPHEN 5-325 MG PO TABS: 1.0000 | ORAL_TABLET | ORAL | Status: DC | PRN

## 2014-03-23 MED ORDER — VANCOMYCIN HCL 1000 MG IV SOLR
INTRAVENOUS | Status: DC | PRN
  Administered 2014-03-23: 1000 mg via TOPICAL

## 2014-03-23 MED ORDER — BUPIVACAINE HCL (PF) 0.25 % IJ SOLN
INTRAMUSCULAR | Status: DC | PRN
Start: 2014-03-23 — End: 2014-03-23
  Administered 2014-03-23: 20 mL

## 2014-03-23 MED ORDER — FUROSEMIDE 40 MG PO TABS
40.0000 mg | ORAL_TABLET | Freq: Every day | ORAL | Status: DC
  Administered 2014-03-24 – 2014-03-25 (×2): 40 mg via ORAL
  Filled 2014-03-23 (×3): qty 1

## 2014-03-23 MED ORDER — ARTIFICIAL TEARS OP OINT
TOPICAL_OINTMENT | OPHTHALMIC | Status: AC
  Filled 2014-03-23: qty 3.5

## 2014-03-23 MED ORDER — DOCUSATE SODIUM 100 MG PO CAPS
100.0000 mg | ORAL_CAPSULE | Freq: Two times a day (BID) | ORAL | Status: DC
  Administered 2014-03-23 – 2014-03-25 (×4): 100 mg via ORAL
  Filled 2014-03-23 (×4): qty 1

## 2014-03-23 MED ORDER — ROCURONIUM BROMIDE 50 MG/5ML IV SOLN
INTRAVENOUS | Status: AC
  Filled 2014-03-23: qty 1

## 2014-03-23 MED ORDER — ALLOPURINOL 100 MG PO TABS
300.0000 mg | ORAL_TABLET | Freq: Every day | ORAL | Status: DC
  Administered 2014-03-24 – 2014-03-25 (×2): 300 mg via ORAL
  Filled 2014-03-23 (×2): qty 3

## 2014-03-23 MED ORDER — ROCURONIUM BROMIDE 100 MG/10ML IV SOLN
INTRAVENOUS | Status: DC | PRN
  Administered 2014-03-23: 10 mg via INTRAVENOUS
  Administered 2014-03-23: 50 mg via INTRAVENOUS

## 2014-03-23 MED ORDER — CEFAZOLIN SODIUM 1-5 GM-% IV SOLN
1.0000 g | Freq: Three times a day (TID) | INTRAVENOUS | Status: AC
  Administered 2014-03-23 – 2014-03-24 (×2): 1 g via INTRAVENOUS
  Filled 2014-03-23 (×2): qty 50

## 2014-03-23 MED ORDER — SODIUM CHLORIDE 0.9 % IR SOLN
Status: DC | PRN
  Administered 2014-03-23: 500 mL

## 2014-03-23 MED ORDER — THROMBIN 5000 UNITS EX SOLR
CUTANEOUS | Status: DC | PRN
  Administered 2014-03-23 (×2): 5000 [IU] via TOPICAL

## 2014-03-23 MED ORDER — PROPOFOL 10 MG/ML IV BOLUS
INTRAVENOUS | Status: DC | PRN
  Administered 2014-03-23: 110 mg via INTRAVENOUS
  Administered 2014-03-23: 20 mg via INTRAVENOUS

## 2014-03-23 MED ORDER — LISINOPRIL 2.5 MG PO TABS
2.5000 mg | ORAL_TABLET | Freq: Every day | ORAL | Status: DC
  Administered 2014-03-24 – 2014-03-25 (×2): 2.5 mg via ORAL
  Filled 2014-03-23 (×3): qty 1

## 2014-03-23 MED ORDER — KETOROLAC TROMETHAMINE 30 MG/ML IJ SOLN
30.0000 mg | Freq: Four times a day (QID) | INTRAMUSCULAR | Status: DC
  Administered 2014-03-23 – 2014-03-25 (×7): 30 mg via INTRAVENOUS
  Filled 2014-03-23 (×8): qty 1

## 2014-03-23 MED ORDER — GLYCOPYRROLATE 0.2 MG/ML IJ SOLN
INTRAMUSCULAR | Status: AC
  Filled 2014-03-23: qty 3

## 2014-03-23 MED ORDER — ARTIFICIAL TEARS OP OINT
TOPICAL_OINTMENT | OPHTHALMIC | Status: DC | PRN
Start: 2014-03-23 — End: 2014-03-23
  Administered 2014-03-23: 1 via OPHTHALMIC

## 2014-03-23 MED ORDER — MIDAZOLAM HCL 2 MG/2ML IJ SOLN
INTRAMUSCULAR | Status: AC
  Filled 2014-03-23: qty 2

## 2014-03-23 MED ORDER — NEOSTIGMINE METHYLSULFATE 10 MG/10ML IV SOLN
INTRAVENOUS | Status: AC
  Filled 2014-03-23: qty 1

## 2014-03-23 MED ORDER — 0.9 % SODIUM CHLORIDE (POUR BTL) OPTIME
TOPICAL | Status: DC | PRN
  Administered 2014-03-23: 1000 mL

## 2014-03-23 MED ORDER — SODIUM CHLORIDE 0.9 % IV SOLN: 250.0000 mL | INTRAVENOUS | Status: DC

## 2014-03-23 MED ORDER — FENTANYL CITRATE 0.05 MG/ML IJ SOLN
INTRAMUSCULAR | Status: DC | PRN
  Administered 2014-03-23: 50 ug via INTRAVENOUS
  Administered 2014-03-23: 25 ug via INTRAVENOUS
  Administered 2014-03-23: 50 ug via INTRAVENOUS
  Administered 2014-03-23: 25 ug via INTRAVENOUS
  Administered 2014-03-23: 50 ug via INTRAVENOUS

## 2014-03-23 MED ORDER — FENTANYL CITRATE 0.05 MG/ML IJ SOLN
INTRAMUSCULAR | Status: AC
  Filled 2014-03-23: qty 5

## 2014-03-23 MED ORDER — POTASSIUM CHLORIDE CRYS ER 20 MEQ PO TBCR
20.0000 meq | EXTENDED_RELEASE_TABLET | Freq: Every evening | ORAL | Status: DC
  Administered 2014-03-23 – 2014-03-24 (×2): 20 meq via ORAL
  Filled 2014-03-23 (×2): qty 1

## 2014-03-23 MED ORDER — FLUTICASONE PROPIONATE 50 MCG/ACT NA SUSP
2.0000 | Freq: Two times a day (BID) | NASAL | Status: DC
  Administered 2014-03-23 – 2014-03-24 (×3): 2 via NASAL
  Filled 2014-03-23: qty 16

## 2014-03-23 MED ORDER — HEMOSTATIC AGENTS (NO CHARGE) OPTIME
TOPICAL | Status: DC | PRN
  Administered 2014-03-23: 1 via TOPICAL

## 2014-03-23 MED ORDER — DEXAMETHASONE SODIUM PHOSPHATE 10 MG/ML IJ SOLN
INTRAMUSCULAR | Status: AC
  Administered 2014-03-23: 10 mg via INTRAVENOUS
  Filled 2014-03-23: qty 1

## 2014-03-23 MED ORDER — PHENYLEPHRINE HCL 10 MG/ML IJ SOLN
INTRAMUSCULAR | Status: DC | PRN
  Administered 2014-03-23 (×2): 40 ug via INTRAVENOUS

## 2014-03-23 MED ORDER — SODIUM CHLORIDE 0.9 % IJ SOLN
3.0000 mL | Freq: Two times a day (BID) | INTRAMUSCULAR | Status: DC
  Administered 2014-03-24 (×2): 3 mL via INTRAVENOUS

## 2014-03-23 MED ORDER — LIDOCAINE HCL (CARDIAC) 20 MG/ML IV SOLN
INTRAVENOUS | Status: AC
  Filled 2014-03-23: qty 5

## 2014-03-23 MED ORDER — ONDANSETRON HCL 4 MG/2ML IJ SOLN: 4.0000 mg | INTRAMUSCULAR | Status: DC | PRN

## 2014-03-23 MED ORDER — MENTHOL 3 MG MT LOZG: 1.0000 | LOZENGE | OROMUCOSAL | Status: DC | PRN

## 2014-03-23 MED ORDER — MIDAZOLAM HCL 5 MG/5ML IJ SOLN
INTRAMUSCULAR | Status: DC | PRN
  Administered 2014-03-23 (×2): 1 mg via INTRAVENOUS

## 2014-03-23 MED ORDER — ONDANSETRON HCL 4 MG/2ML IJ SOLN
INTRAMUSCULAR | Status: DC | PRN
  Administered 2014-03-23: 4 mg via INTRAVENOUS

## 2014-03-23 MED ORDER — PROPOFOL 10 MG/ML IV BOLUS
INTRAVENOUS | Status: AC
  Filled 2014-03-23: qty 20

## 2014-03-23 MED ORDER — METFORMIN HCL 500 MG PO TABS
1000.0000 mg | ORAL_TABLET | Freq: Two times a day (BID) | ORAL | Status: DC
  Administered 2014-03-24 – 2014-03-25 (×3): 1000 mg via ORAL
  Filled 2014-03-23 (×3): qty 2

## 2014-03-23 MED ORDER — PHENYLEPHRINE 40 MCG/ML (10ML) SYRINGE FOR IV PUSH (FOR BLOOD PRESSURE SUPPORT)
PREFILLED_SYRINGE | INTRAVENOUS | Status: AC
  Filled 2014-03-23: qty 10

## 2014-03-23 MED ORDER — CARVEDILOL 25 MG PO TABS
25.0000 mg | ORAL_TABLET | Freq: Once | ORAL | Status: AC
  Administered 2014-03-23: 25 mg via ORAL

## 2014-03-23 MED ORDER — TRAMADOL HCL 50 MG PO TABS: 50.0000 mg | ORAL_TABLET | Freq: Two times a day (BID) | ORAL | Status: DC | PRN

## 2014-03-23 MED ORDER — GLIPIZIDE ER 10 MG PO TB24
10.0000 mg | ORAL_TABLET | Freq: Every day | ORAL | Status: DC
  Administered 2014-03-24 – 2014-03-25 (×2): 10 mg via ORAL
  Filled 2014-03-23 (×3): qty 1

## 2014-03-23 MED ORDER — GABAPENTIN 300 MG PO CAPS
600.0000 mg | ORAL_CAPSULE | Freq: Three times a day (TID) | ORAL | Status: DC
  Administered 2014-03-23 – 2014-03-25 (×5): 600 mg via ORAL
  Filled 2014-03-23 (×5): qty 2

## 2014-03-23 MED ORDER — CARVEDILOL 12.5 MG PO TABS
ORAL_TABLET | ORAL | Status: AC
  Administered 2014-03-23: 25 mg via ORAL
  Filled 2014-03-23: qty 2

## 2014-03-23 MED ORDER — ONDANSETRON HCL 4 MG/2ML IJ SOLN
INTRAMUSCULAR | Status: AC
  Filled 2014-03-23: qty 2

## 2014-03-23 MED ORDER — ATORVASTATIN CALCIUM 80 MG PO TABS
80.0000 mg | ORAL_TABLET | Freq: Every day | ORAL | Status: DC
  Administered 2014-03-23 – 2014-03-25 (×3): 80 mg via ORAL
  Filled 2014-03-23 (×3): qty 1

## 2014-03-23 MED ORDER — CEFAZOLIN SODIUM-DEXTROSE 2-3 GM-% IV SOLR
INTRAVENOUS | Status: DC | PRN
  Administered 2014-03-23: 2 g via INTRAVENOUS

## 2014-03-23 MED ORDER — LACTATED RINGERS IV SOLN
INTRAVENOUS | Status: DC
  Administered 2014-03-23: 10:00:00 via INTRAVENOUS

## 2014-03-23 MED ORDER — SODIUM CHLORIDE 0.9 % IJ SOLN: 3.0000 mL | INTRAMUSCULAR | Status: DC | PRN

## 2014-03-23 MED ORDER — LINAGLIPTIN 5 MG PO TABS
5.0000 mg | ORAL_TABLET | Freq: Every day | ORAL | Status: DC
  Administered 2014-03-24 – 2014-03-25 (×2): 5 mg via ORAL
  Filled 2014-03-23 (×2): qty 1

## 2014-03-23 MED ORDER — GLYCOPYRROLATE 0.2 MG/ML IJ SOLN
INTRAMUSCULAR | Status: DC | PRN
  Administered 2014-03-23: 0.6 mg via INTRAVENOUS

## 2014-03-23 MED ORDER — OXYCODONE-ACETAMINOPHEN 5-325 MG PO TABS
1.0000 | ORAL_TABLET | ORAL | Status: DC | PRN
  Administered 2014-03-23 – 2014-03-25 (×7): 2 via ORAL
  Filled 2014-03-23 (×7): qty 2

## 2014-03-23 MED ORDER — INSULIN ASPART 100 UNIT/ML ~~LOC~~ SOLN
0.0000 [IU] | Freq: Three times a day (TID) | SUBCUTANEOUS | Status: DC
  Administered 2014-03-23 – 2014-03-25 (×5): 4 [IU] via SUBCUTANEOUS

## 2014-03-23 MED ORDER — CEFAZOLIN SODIUM-DEXTROSE 2-3 GM-% IV SOLR
INTRAVENOUS | Status: AC
  Filled 2014-03-23: qty 50

## 2014-03-23 MED ORDER — PROMETHAZINE HCL 25 MG/ML IJ SOLN: 6.2500 mg | INTRAMUSCULAR | Status: DC | PRN

## 2014-03-23 MED ORDER — NEOSTIGMINE METHYLSULFATE 10 MG/10ML IV SOLN
INTRAVENOUS | Status: DC | PRN
  Administered 2014-03-23: 4 mg via INTRAVENOUS

## 2014-03-23 MED ORDER — CARVEDILOL 12.5 MG PO TABS
25.0000 mg | ORAL_TABLET | Freq: Two times a day (BID) | ORAL | Status: DC
Start: 2014-03-23 — End: 2014-03-25
  Administered 2014-03-23 – 2014-03-25 (×4): 25 mg via ORAL
  Filled 2014-03-23 (×4): qty 2

## 2014-03-23 MED ORDER — CYCLOBENZAPRINE HCL 10 MG PO TABS: 10.0000 mg | ORAL_TABLET | Freq: Three times a day (TID) | ORAL | Status: DC | PRN

## 2014-03-23 MED ORDER — TAMSULOSIN HCL 0.4 MG PO CAPS
0.4000 mg | ORAL_CAPSULE | Freq: Two times a day (BID) | ORAL | Status: DC
  Administered 2014-03-23 – 2014-03-25 (×4): 0.4 mg via ORAL
  Filled 2014-03-23 (×4): qty 1

## 2014-03-23 MED ORDER — PHENOL 1.4 % MT LIQD: 1.0000 | OROMUCOSAL | Status: DC | PRN

## 2014-03-23 MED ORDER — HYDROMORPHONE HCL 1 MG/ML IJ SOLN
0.2500 mg | INTRAMUSCULAR | Status: DC | PRN
  Administered 2014-03-23: 0.5 mg via INTRAVENOUS

## 2014-03-23 MED ORDER — LIDOCAINE HCL (CARDIAC) 20 MG/ML IV SOLN
INTRAVENOUS | Status: DC | PRN
  Administered 2014-03-23: 50 mg via INTRAVENOUS

## 2014-03-23 SURGICAL SUPPLY — 61 items
APL SKNCLS STERI-STRIP NONHPOA (GAUZE/BANDAGES/DRESSINGS) ×1
APL SRG 60D 8 XTD TIP BNDBL (TIP) ×1
BAG DECANTER FOR FLEXI CONT (MISCELLANEOUS) ×3 IMPLANT
BENZOIN TINCTURE PRP APPL 2/3 (GAUZE/BANDAGES/DRESSINGS) ×3 IMPLANT
BLADE CLIPPER SURG (BLADE) ×2 IMPLANT
BRUSH SCRUB EZ PLAIN DRY (MISCELLANEOUS) ×3 IMPLANT
BUR CUTTER 7.0 ROUND (BURR) ×3 IMPLANT
CANISTER SUCT 3000ML PPV (MISCELLANEOUS) ×3 IMPLANT
CLOSURE WOUND 1/2 X4 (GAUZE/BANDAGES/DRESSINGS) ×1
CONT SPEC 4OZ CLIKSEAL STRL BL (MISCELLANEOUS) ×3 IMPLANT
DECANTER SPIKE VIAL GLASS SM (MISCELLANEOUS) ×1 IMPLANT
DRAPE LAPAROTOMY 100X72X124 (DRAPES) ×3 IMPLANT
DRAPE MICROSCOPE LEICA (MISCELLANEOUS) ×3 IMPLANT
DRAPE POUCH INSTRU U-SHP 10X18 (DRAPES) ×3 IMPLANT
DRAPE PROXIMA HALF (DRAPES) IMPLANT
DRAPE SURG 17X23 STRL (DRAPES) ×6 IMPLANT
DRSG OPSITE POSTOP 4X6 (GAUZE/BANDAGES/DRESSINGS) ×2 IMPLANT
DURAPREP 26ML APPLICATOR (WOUND CARE) ×3 IMPLANT
DURASEAL APPLICATOR TIP (TIP) ×2 IMPLANT
DURASEAL SPINE SEALANT 3ML (MISCELLANEOUS) ×2 IMPLANT
ELECT REM PT RETURN 9FT ADLT (ELECTROSURGICAL) ×3
ELECTRODE REM PT RTRN 9FT ADLT (ELECTROSURGICAL) ×1 IMPLANT
GAUZE SPONGE 4X4 12PLY STRL (GAUZE/BANDAGES/DRESSINGS) ×1 IMPLANT
GAUZE SPONGE 4X4 16PLY XRAY LF (GAUZE/BANDAGES/DRESSINGS) IMPLANT
GLOVE BIO SURGEON STRL SZ7 (GLOVE) ×2 IMPLANT
GLOVE BIO SURGEON STRL SZ8 (GLOVE) ×2 IMPLANT
GLOVE BIOGEL PI IND STRL 7.0 (GLOVE) IMPLANT
GLOVE BIOGEL PI IND STRL 7.5 (GLOVE) ×1 IMPLANT
GLOVE BIOGEL PI INDICATOR 7.0 (GLOVE) ×4
GLOVE BIOGEL PI INDICATOR 7.5 (GLOVE) ×2
GLOVE ECLIPSE 6.5 STRL STRAW (GLOVE) ×6 IMPLANT
GLOVE ECLIPSE 7.5 STRL STRAW (GLOVE) ×2 IMPLANT
GLOVE ECLIPSE 9.0 STRL (GLOVE) ×5 IMPLANT
GLOVE EXAM NITRILE LRG STRL (GLOVE) IMPLANT
GLOVE EXAM NITRILE MD LF STRL (GLOVE) IMPLANT
GLOVE EXAM NITRILE XL STR (GLOVE) IMPLANT
GLOVE EXAM NITRILE XS STR PU (GLOVE) IMPLANT
GLOVE INDICATOR 8.5 STRL (GLOVE) ×3 IMPLANT
GOWN STRL REUS W/ TWL LRG LVL3 (GOWN DISPOSABLE) IMPLANT
GOWN STRL REUS W/ TWL XL LVL3 (GOWN DISPOSABLE) ×2 IMPLANT
GOWN STRL REUS W/TWL 2XL LVL3 (GOWN DISPOSABLE) IMPLANT
GOWN STRL REUS W/TWL LRG LVL3 (GOWN DISPOSABLE) ×3
GOWN STRL REUS W/TWL XL LVL3 (GOWN DISPOSABLE) ×6
KIT BASIN OR (CUSTOM PROCEDURE TRAY) ×3 IMPLANT
KIT ROOM TURNOVER OR (KITS) ×3 IMPLANT
LIQUID BAND (GAUZE/BANDAGES/DRESSINGS) ×3 IMPLANT
NDL SPNL 22GX3.5 QUINCKE BK (NEEDLE) ×1 IMPLANT
NEEDLE HYPO 22GX1.5 SAFETY (NEEDLE) ×3 IMPLANT
NEEDLE SPNL 22GX3.5 QUINCKE BK (NEEDLE) ×3 IMPLANT
NS IRRIG 1000ML POUR BTL (IV SOLUTION) ×3 IMPLANT
PACK LAMINECTOMY NEURO (CUSTOM PROCEDURE TRAY) ×3 IMPLANT
PAD ARMBOARD 7.5X6 YLW CONV (MISCELLANEOUS) ×13 IMPLANT
RUBBERBAND STERILE (MISCELLANEOUS) ×6 IMPLANT
SPONGE SURGIFOAM ABS GEL SZ50 (HEMOSTASIS) ×3 IMPLANT
STRIP CLOSURE SKIN 1/2X4 (GAUZE/BANDAGES/DRESSINGS) ×2 IMPLANT
SUT VIC AB 2-0 CT1 18 (SUTURE) ×5 IMPLANT
SUT VIC AB 3-0 SH 8-18 (SUTURE) ×3 IMPLANT
SYR 20ML ECCENTRIC (SYRINGE) ×3 IMPLANT
TOWEL OR 17X24 6PK STRL BLUE (TOWEL DISPOSABLE) ×3 IMPLANT
TOWEL OR 17X26 10 PK STRL BLUE (TOWEL DISPOSABLE) ×3 IMPLANT
WATER STERILE IRR 1000ML POUR (IV SOLUTION) ×3 IMPLANT

## 2014-03-23 NOTE — Brief Op Note (Signed)
03/23/2014  1:09 PM  PATIENT:  [name]  69 y.o. male  PRE-OPERATIVE DIAGNOSIS:  HNP  POST-OPERATIVE DIAGNOSIS:  Herniated Nucleous Pulposus  PROCEDURE:  Procedure(s) with comments: LAMINECTOMY AND FORAMINOTOMY RIGHT LUMBAR THREE-FOUR,LUMBAR FOUR-FIVE, LUMBAR FIVE-SACRAL ONE (Right) - right  SURGEON:  Surgeon(s) and Role:    * Charlie Pitter, MD - Primary    * Elaina Hoops, MD - Assisting  PHYSICIAN ASSISTANT:   ASSISTANTS:    ANESTHESIA:   general  EBL:  Total I/O In: 1000 [I.V.:1000] Out: 200 [Blood:200]  BLOOD ADMINISTERED:none  DRAINS: none   LOCAL MEDICATIONS USED:  MARCAINE     SPECIMEN:  No Specimen  DISPOSITION OF SPECIMEN:  N/A  COUNTS:  YES  TOURNIQUET:  * No tourniquets in log *  DICTATION: .Dragon Dictation  PLAN OF CARE: Admit for overnight observation  PATIENT DISPOSITION:  PACU - hemodynamically stable.   Delay start of Pharmacological VTE agent (>24hrs) due to surgical blood loss or risk of bleeding: yes

## 2014-03-23 NOTE — Progress Notes (Signed)
Pt arrived to unit per stretcher from PACU with Faythe Dingwall, PACU RN. No acute distress noted. Pt transferred to bed. Assessment performed as charted. Will monitor pt clsoely   Angeline Slim I 03/23/2014 3:58 PM

## 2014-03-23 NOTE — Anesthesia Postprocedure Evaluation (Signed)
Anesthesia Post Note  Patient: [name]  Procedure(s) Performed: Procedure(s) (LRB): LAMINECTOMY AND FORAMINOTOMY RIGHT LUMBAR THREE-FOUR,LUMBAR FOUR-FIVE, LUMBAR FIVE-SACRAL ONE (Right)  Anesthesia type: General  Patient location: PACU  Post pain: Pain level controlled  Post assessment: Post-op Vital signs reviewed  Last Vitals: BP 137/94 mmHg  Pulse 70  Temp(Src) 36.2 C (Oral)  Resp 14  Ht 5\' 11"  (1.803 m)  Wt 221 lb (100.245 kg)  BMI 30.84 kg/m2  SpO2 92%  Post vital signs: Reviewed  Level of consciousness: sedated  Complications: No apparent anesthesia complications

## 2014-03-23 NOTE — H&P (Signed)
[name] is an 69 y.o. male.   Chief Complaint: Right lower extremity pain HPI: 69 year old male with severe coronary artery disease status post pacemaker with a history of chronic in nature fibrillation for which she is anticoagulated presents with intractable back and right lower extremity pain failing all conservative management. Workup demonstrates evidence of marked lumbar spondylosis with stenosis on the right side at L3-4, L4-5 and L5-S1. Patient presents now for decompressive surgery in hopes of improving his symptoms.  Past Medical History  Diagnosis Date  . 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 HYPERLIPIDEMIA 08/29/2006    takes Atorvastatin daily  . GOUT 04/22/2007    takes Allopurinol daily  . DEPRESSION 08/29/2006  . 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 DIASTOLIC HEART FAILURE, CHRONIC 06/09/2008  . BENIGN PROSTATIC HYPERTROPHY 08/29/2006    takes Flomax daily  . LUMBAR RADICULOPATHY, RIGHT 06/10/2007  . INSOMNIA-SLEEP DISORDER-UNSPEC 10/23/2007  . PACEMAKER, PERMANENT 03/04/2008    pt denies this date  . Left lumbar radiculopathy 05/30/2010  . Sleep apnea     "if I lay flat I quit breathing; HOB up I'm fine"  . Arthritis     "lower back; going back down both my sciatic nerves"  . Aortic root dilatation 02/02/2011  . HYPERTENSION 08/29/2006    takes Lisinopril daily  . Myocardial infarction 12/27/10    "I've had several MIs"  . Muscle spasm     takes Zanaflex daily  . CHF (congestive heart failure)     takes Lasix daily  . Shortness of breath dyspnea     "all my life" with exertion  . History of migraine     9yrs ago  . DIABETES MELLITUS, TYPE II 08/29/2006    takes Metformin,Januvia,and Glipizide  daily  . CORONARY ARTERY DISEASE 08/29/2006    takes Coumadin daily  . 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 Peripheral neuropathy     takes Gabapentin daily  . Chronic back pain     HNP   . Cataracts, bilateral     immature  . Presence of  permanent cardiac pacemaker     Past Surgical History  Procedure Laterality Date  . S/p left arm surgury after work accident  1991    "2000# steel fell on it"  . S/p right hand surgury for foreign object  1970's    "piece of wood went in my hand; had to get that out"  . Insert / replace / remove pacemaker  ~ 2004    initial pacemaker placement  . Insert / replace / remove pacemaker  10/2009    generator change  . Colonoscopy    . Esophagogastroduodenoscopy N/A 12/01/2013    Procedure: ESOPHAGOGASTRODUODENOSCOPY (EGD);  Surgeon: Lafayette Dragon, MD;  Location: [facility];  Service: Endoscopy;  Laterality: N/A;  . Coronary artery bypass graft  1992    CABG X 2  . Coronary angioplasty with stent placement  12/27/10    "I've had a total of 9 cardiac stents put in"    Family History  Problem Relation Age of Onset  . Diabetes Mother   . Diabetes Sister   . Heart disease Sister     2 sister died with heart disease  . Coronary artery disease Other 62    male, first degree relative  . Diabetes Other     1st degree relative  . Heart disease Sister   . Lung cancer Sister     deceased   Social History:  reports that  he has quit smoking. His smoking use included Cigarettes. He has a 27 pack-year smoking history. He has quit using smokeless tobacco. He reports that he does not drink alcohol or use illicit drugs.  Allergies:  Allergies  Allergen Reactions  . Crestor [Rosuvastatin Calcium] Other (See Comments)    Urination of blood    Medications Prior to Admission  Medication Sig Dispense Refill  . allopurinol (ZYLOPRIM) 300 MG tablet take 1 tablet by mouth once daily (Patient taking differently: Take 300 mg by mouth daily. ) 90 tablet 3  . atorvastatin (LIPITOR) 80 MG tablet Take 1 tablet (80 mg total) by mouth daily. 90 tablet 3  . carvedilol (COREG) 25 MG tablet Take 1 tablet (25 mg total) by mouth 2 (two) times daily. 180 tablet 3  . Cholecalciferol (VITAMIN D) 1000 UNITS  capsule Take 1,000 Units by mouth daily.     Marland Kitchen enoxaparin (LOVENOX) 100 MG/ML injection Inject 1 mL (100 mg total) into the skin every 12 (twelve) hours. (Patient taking differently: Inject 100 mg into the skin every 12 (twelve) hours. 1st dose in Short stay [place] at 1430. To get  additional doses Saturday 03/21/14 and Sunday2/21/16  0830 and 2030 . Total of 5 doses prior to surgery) 18 Syringe 0  . fluticasone (FLONASE) 50 MCG/ACT nasal spray Place 2 sprays into both nostrils 2 (two) times daily. 48 g 3  . furosemide (LASIX) 40 MG tablet Take 1 tablet (40 mg total) by mouth daily. 90 tablet 3  . gabapentin (NEURONTIN) 300 MG capsule Take 2 capsules (600 mg total) by mouth 3 (three) times daily. 540 capsule 3  . glipiZIDE (GLUCOTROL XL) 10 MG 24 hr tablet Take 1 tablet (10 mg total) by mouth daily with breakfast. 90 tablet 3  . glucose blood (ONE TOUCH ULTRA TEST) test strip Use to check blood sugar twice a day Dx E11.40 (Patient taking differently: 1 each by Other route See admin instructions. Check blood sugar 3 times daily) 300 each 3  . ibuprofen (ADVIL,MOTRIN) 200 MG tablet Take 800 mg by mouth daily as needed for mild pain or moderate pain.     Marland Kitchen lisinopril (PRINIVIL,ZESTRIL) 2.5 MG tablet Take 1 tablet (2.5 mg total) by mouth daily. 90 tablet 3  . metFORMIN (GLUCOPHAGE) 500 MG tablet Take 2 tablets (1,000 mg total) by mouth 2 (two) times daily. 360 tablet 3  . Multiple Vitamin (MULTIVITAMIN WITH MINERALS) TABS tablet Take 1 tablet by mouth every morning.    . potassium chloride SA (K-DUR,KLOR-CON) 20 MEQ tablet Take 1 tablet (20 mEq total) by mouth every evening. 90 tablet 3  . sitaGLIPtin (JANUVIA) 100 MG tablet Take 1 tablet (100 mg total) by mouth daily. 90 tablet 3  . tamsulosin (FLOMAX) 0.4 MG CAPS capsule Take 1 capsule (0.4 mg total) by mouth every 12 (twelve) hours. (Patient taking differently: Take 0.4 mg by mouth daily. ) 90 capsule 3  . tiZANidine (ZANAFLEX) 4 MG tablet Take 1  tablet (4 mg total) by mouth every 6 (six) hours as needed for muscle spasms. 180 tablet 3  . traMADol (ULTRAM) 50 MG tablet Take 1 tablet (50 mg total) by mouth every 12 (twelve) hours as needed for moderate pain or severe pain. 60 tablet 2  . warfarin (COUMADIN) 5 MG tablet Take 1 tablet (5 mg total) by mouth daily. (Patient taking differently: Take 5 mg by mouth daily. Take 2.5 mg by mouth on Sunday, Tuesday, Thursday, Friday and Saturday. Take 5mg  by  mouth Monday and Wednesday.) 90 tablet 3  . predniSONE (DELTASONE) 50 MG tablet Take 1 tablet (50 mg total) by mouth daily. (Patient not taking: Reported on 03/17/2014) 5 tablet 0    No results found for this or any previous visit (from the past 48 hour(s)). No results found.  A comprehensive review of systems was negative.  Blood pressure 144/90, pulse 82, temperature 97.6 F (36.4 C), temperature source Oral, resp. rate 18, height 5\' 11"  (1.803 m), weight 100.245 kg (221 lb), SpO2 99 %.  The patient is awake and alert. Oriented and appropriate. Motor and sensory function of his extremities are intact except he has some mild dorsiflexion weakness on the right side grading at 4 over 5 in has decreased sensation to pinprick and light touch in his right L4 and L5 dermatomes. T10 reflexes are normal active except his right patellar reflex is diminished and his Achilles reflexes are absent bilaterally. There is no evidence of long track signs. No evidence of cerebellar signs. Examination head ears eyes or throat is unremarkable. Chest and abdomen are stable. Extremities are free from injury or deformity. Assessment/Plan Lumbar spondylosis with stenosis. Plan right L3-4, L4-5, L5-S1 decompressive laminotomies with foraminotomies. Risks and benefits of been explained. Patient wishes to proceed.  Shariyah Eland A 03/23/2014, 10:22 AM

## 2014-03-23 NOTE — Progress Notes (Signed)
MArcia from [employer] here to check pacemaker.

## 2014-03-23 NOTE — Anesthesia Preprocedure Evaluation (Addendum)
Anesthesia Evaluation  Patient identified by MRN, date of birth, ID band Patient awake    Reviewed: Allergy & Precautions, NPO status , Patient's Chart, lab work & pertinent test results, reviewed documented beta blocker date and time   Airway Mallampati: II  TM Distance: >3 FB Neck ROM: Full    Dental no notable dental hx. (+) Edentulous Lower, Edentulous Upper   Pulmonary shortness of breath, sleep apnea , former smoker,  breath sounds clear to auscultation  Pulmonary exam normal       Cardiovascular hypertension, Pt. on medications and Pt. on home beta blockers + CAD, + Past MI, + Cardiac Stents, + CABG, + Peripheral Vascular Disease and +CHF + dysrhythmias + pacemaker Rhythm:Regular Rate:Normal  MPS 03/11/2014 Impression Exercise Capacity: Lexiscan with no exercise. BP Response: Normal blood pressure response. Clinical Symptoms: There is dyspnea. ECG Impression: No significant ECG changes with Lexiscan. Afib with V Paced Rhythm Comparison with Prior Nuclear Study: No images to compare  Overall Impression: Low risk stress nuclear study with a  Moderate fixed apical perfusion defect that is either consistent with prio infarction or V-Paced LBBB pattern. . The absence of ischemia and preserved EF would make this a Low Risk as opposed to Intermediate Risk study.  LV Wall Motion: Pacemaker related septal wall motion  abnormality. Also noted is apical akinesis. Low normal EF - likely underestimated.   [employer] Pacer interrogation 09/2013 For Complete heart block Longevity ~81yrs AS-VP 81%, AP-VP, 19%, pt is pacer dependent.   Echo 08/2013 - Left ventricle: The cavity size was normal. Wall thickness wasincreased in a pattern of mild LVH. Systolic function was normal.The estimated ejection fraction was in the range of 55% to 60%.Wall motion was normal; there were no regional wall motion abnormalities. - Left atrium: The  atrium was mildly dilated. - Atrial septum: No defect or patent foramen ovale was identified.    Neuro/Psych  Headaches, Depression negative neurological ROS     GI/Hepatic negative GI ROS, Neg liver ROS,   Endo/Other  diabetes, Type 2, Oral Hypoglycemic Agents  Renal/GU negative Renal ROS     Musculoskeletal negative musculoskeletal ROS (+) Arthritis -,   Abdominal (+) + obese,   Peds  Hematology negative hematology ROS (+) Stopped coumadin, Lovenox bridge for surgery   Anesthesia Other Findings   Reproductive/Obstetrics                        Anesthesia Physical Anesthesia Plan  ASA: III  Anesthesia Plan: General   Post-op Pain Management:    Induction: Intravenous  Airway Management Planned: Oral ETT  Additional Equipment: Arterial line  Intra-op Plan:   Post-operative Plan: Extubation in OR  Informed Consent: I have reviewed the patients History and Physical, chart, labs and discussed the procedure including the risks, benefits and alternatives for the proposed anesthesia with the patient or authorized representative who has indicated his/her understanding and acceptance.   Dental advisory given  Plan Discussed with: CRNA  Anesthesia Plan Comments: (2x piv)      Anesthesia Quick Evaluation

## 2014-03-23 NOTE — Anesthesia Procedure Notes (Signed)
Procedure Name: Intubation Date/Time: 03/23/2014 10:57 AM Performed by: Suzy Bouchard Pre-anesthesia Checklist: Patient identified, Timeout performed, Suction available, Emergency Drugs available and Patient being monitored Patient Re-evaluated:Patient Re-evaluated prior to inductionOxygen Delivery Method: Circle system utilized Preoxygenation: Pre-oxygenation with 100% oxygen Intubation Type: IV induction Ventilation: Mask ventilation without difficulty Laryngoscope Size: Miller and 2 Grade View: Grade I Tube type: Oral Tube size: 7.5 mm Number of attempts: 1 Airway Equipment and Method: Stylet Placement Confirmation: ETT inserted through vocal cords under direct vision,  breath sounds checked- equal and bilateral and positive ETCO2 Secured at: 22 cm Tube secured with: Tape Dental Injury: Teeth and Oropharynx as per pre-operative assessment

## 2014-03-23 NOTE — Op Note (Signed)
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  Asst. Surgeon: Saintclair Halsted  Anesthesia: General  Indication: 69 year old male with severe right lower extremity pain paresthesias and weakness consistent with a mixed lumbar radiculopathy. Workup demonstrates evidence of marked multilevel disc degeneration and associated spondylosis with severe right-sided stenosis at L3-4, L4-5 and L5-S1. Patient presents now for decompressive surgery unilaterally on the right in hopes of improving his symptoms.  Operative note: After induction of anesthesia, patient positioned prone onto a Wilson frame and appropriately padded. Lumbar region prepped and draped. Incision made overlying L4-5 on the right side. Dissection performed. Retractor placed. L3-4 localized initially. Decompressive laminotomy then performed at L3-4 using a high-speed drill and Kerrison rongeurs to remove the inferior aspect lamina of L3 medial aspect of the L3-4 facet joint and the superior rim of the L4 lamina. Ligament flavum was elevated and resected piecemeal fashion using Kerrison rongeurs. Underlying thecal sac L3 and L4 nerve roots were identified. Foraminotomies were completed on course exiting L3 and L4 nerve roots on the right side. Procedures and repeated at L4-5 and L5-S1 again without complications. At this point very thorough decompression had been achieved at all levels. There was some slight leakage of CSF transiently through a pinhole around the S1 nerve root on the right side. This was controlled by coagulating the dura around this very small hole and all leakage of CSF stop. Gelfoam and Tisseel fibrin  glue sealant were placed over the repair. Wound is then irrigated with MRI solution. Gelfoam was placed top with a laminotomy sites for hemostasis which sent to be good. Wounds and closed in layers with Vicryl sutures. Steri-Strips and sterile dressing were applied. There were no apparent complications. The patient tolerated the procedure well. He returns to the recovery room.Marland Kitchen

## 2014-03-23 NOTE — Transfer of Care (Signed)
Immediate Anesthesia Transfer of Care Note  Patient: [name]  Procedure(s) Performed: Procedure(s) with comments: LAMINECTOMY AND FORAMINOTOMY RIGHT LUMBAR THREE-FOUR,LUMBAR FOUR-FIVE, LUMBAR FIVE-SACRAL ONE (Right) - right  Patient Location: PACU  Anesthesia Type:General  Level of Consciousness: sedated  Airway & Oxygen Therapy: Patient Spontanous Breathing and Patient connected to nasal cannula oxygen  Post-op Assessment: Report given to RN and Post -op Vital signs reviewed and stable  Post vital signs: Reviewed and stable  Last Vitals:  Filed Vitals:   03/23/14 0829  BP: 144/90  Pulse: 82  Temp: 36.4 C  Resp: 18    Complications: No apparent anesthesia complications

## 2014-03-24 ENCOUNTER — Encounter (HOSPITAL_COMMUNITY): Payer: Self-pay | Admitting: Neurosurgery

## 2014-03-24 DIAGNOSIS — I739 Peripheral vascular disease, unspecified: Secondary | ICD-10-CM | POA: Diagnosis not present

## 2014-03-24 DIAGNOSIS — E119 Type 2 diabetes mellitus without complications: Secondary | ICD-10-CM | POA: Diagnosis not present

## 2014-03-24 DIAGNOSIS — F329 Major depressive disorder, single episode, unspecified: Secondary | ICD-10-CM | POA: Diagnosis not present

## 2014-03-24 DIAGNOSIS — M1990 Unspecified osteoarthritis, unspecified site: Secondary | ICD-10-CM | POA: Diagnosis not present

## 2014-03-24 DIAGNOSIS — I252 Old myocardial infarction: Secondary | ICD-10-CM | POA: Diagnosis not present

## 2014-03-24 DIAGNOSIS — G4730 Sleep apnea, unspecified: Secondary | ICD-10-CM | POA: Diagnosis not present

## 2014-03-24 DIAGNOSIS — Z7901 Long term (current) use of anticoagulants: Secondary | ICD-10-CM | POA: Diagnosis not present

## 2014-03-24 DIAGNOSIS — E785 Hyperlipidemia, unspecified: Secondary | ICD-10-CM | POA: Diagnosis not present

## 2014-03-24 DIAGNOSIS — M4726 Other spondylosis with radiculopathy, lumbar region: Secondary | ICD-10-CM | POA: Diagnosis not present

## 2014-03-24 DIAGNOSIS — Z955 Presence of coronary angioplasty implant and graft: Secondary | ICD-10-CM | POA: Diagnosis not present

## 2014-03-24 DIAGNOSIS — I10 Essential (primary) hypertension: Secondary | ICD-10-CM | POA: Diagnosis not present

## 2014-03-24 DIAGNOSIS — M4806 Spinal stenosis, lumbar region: Secondary | ICD-10-CM | POA: Diagnosis not present

## 2014-03-24 DIAGNOSIS — I5032 Chronic diastolic (congestive) heart failure: Secondary | ICD-10-CM | POA: Diagnosis not present

## 2014-03-24 DIAGNOSIS — Z87891 Personal history of nicotine dependence: Secondary | ICD-10-CM | POA: Diagnosis not present

## 2014-03-24 DIAGNOSIS — Z950 Presence of cardiac pacemaker: Secondary | ICD-10-CM | POA: Diagnosis not present

## 2014-03-24 LAB — GLUCOSE, CAPILLARY: Glucose-Capillary: 177 mg/dL — ABNORMAL HIGH (ref 70–99)

## 2014-03-24 MED ORDER — POLYETHYLENE GLYCOL 3350 17 G PO PACK
17.0000 g | PACK | Freq: Two times a day (BID) | ORAL | Status: DC
  Administered 2014-03-25: 17 g via ORAL
  Filled 2014-03-24 (×2): qty 1

## 2014-03-24 MED ORDER — SENNA 8.6 MG PO TABS
1.0000 | ORAL_TABLET | Freq: Every day | ORAL | Status: DC
  Administered 2014-03-24: 8.6 mg via ORAL
  Filled 2014-03-24: qty 1

## 2014-03-24 NOTE — Evaluation (Signed)
Occupational Therapy Evaluation Patient Details Name: [name] MRN: [id number] DOB: February 09, 1945 Today's Date: 03/24/2014    History of Present Illness Patient is a 69 y/o male s/p Right L3-4, 4-5, L5-S1 LAMINECTOMY AND FORAMINOTOMY. PMH of HTN, pacemaker, MI, CHF, DM and CAD.   Clinical Impression   Patient independent PTA. Patient currently functioning at an overall supervision level. Patient will benefit from acute OT to increase overall independence in the areas of ADLs, functional mobility, and overall safety in order to safely discharge home with wife who has a PCA.     Follow Up Recommendations  No OT follow up;Supervision/Assistance - 24 hour    Equipment Recommendations  Other (comment) (Rolling walker - pt states his is old)    Recommendations for Other Services  None at this time     Precautions / Restrictions Precautions Precautions: Back;Fall Precaution Booklet Issued: Yes (comment) Precaution Comments: reviewed back precautions and handout.  Restrictions Weight Bearing Restrictions: No      Mobility Bed Mobility - Per PT eval Overal bed mobility: Needs Assistance Bed Mobility: Rolling;Sidelying to Sit Rolling: Supervision Sidelying to sit: Min assist;HOB elevated       General bed mobility comments: HOB elevated 30 degrees. Use of rails for support. Cues for log roll technique.   Transfers Overall transfer level: Needs assistance Equipment used: Rolling walker (2 wheeled) Transfers: Sit to/from Stand Sit to Stand: Supervision         General transfer comment: Use of momentum to stand. Stood from [employer].     Balance - Per PT eval Overall balance assessment: Needs assistance Sitting-balance support: Feet supported;No upper extremity supported Sitting balance-Leahy Scale: Good     Standing balance support: During functional activity Standing balance-Leahy Scale: Fair     ADL Overall ADL's : Needs assistance/impaired Eating/Feeding:  Independent;Sitting   Grooming: Supervision/safety;Standing   Upper Body Bathing: Set up;Sitting   Lower Body Bathing: Supervison/ safety;Sit to/from stand;Cueing for safety   Upper Body Dressing : Set up;Sitting   Lower Body Dressing: Supervision/safety;Sit to/from stand;Cueing for safety   Toilet Transfer: Supervision/safety;RW;Grab bars;Comfort height toilet   Functional mobility during ADLs: Supervision/safety;Rolling walker General ADL Comments: Patient able to cross BLEs for LB ADLs. Patient overall supervision with RW, requring cues to maintain back precuations. Patient will benefit from education and practice on tub/shower transfers using tub transfer bench and RW.     Pertinent Vitals/Pain Pain Assessment: 0-10 Pain Score: 5  Pain Location: back Pain Descriptors / Indicators: Sore Pain Intervention(s): Premedicated before session;Monitored during session     Hand Dominance Right   Extremity/Trunk Assessment Upper Extremity Assessment Upper Extremity Assessment: Overall WFL for tasks assessed   Lower Extremity Assessment Lower Extremity Assessment: Defer to PT evaluation RLE Sensation: history of peripheral neuropathy LLE Sensation: history of peripheral neuropathy   Cervical / Trunk Assessment Cervical / Trunk Assessment: Normal   Communication Communication Communication: No difficulties   Cognition Arousal/Alertness: Awake/alert Behavior During Therapy: WFL for tasks assessed/performed Overall Cognitive Status: Within Functional Limits for tasks assessed       Memory: Decreased recall of precautions             Home Living Family/patient expects to be discharged to:: Private residence Living Arrangements: Spouse/significant other Available Help at Discharge: Family;Available PRN/intermittently (wife is disabled and has a CNA to assist) Type of Home: House Home Access: Ramped entrance     Home Layout: One level     Bathroom Shower/Tub:  Tub/shower unit;Curtain  Bathroom Toilet: Standard     Home Equipment: Environmental consultant - 2 wheels;Walker - standard;Bedside commode;Toilet riser;Tub bench;Hospital bed;Wheelchair - manual;Wheelchair - power   Additional Comments: 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 Wife has PCA come in 5 days/week. Pt reports his niece could come stay and help out a few times a week. Pt reports he has a walker, but "it's bending".       Prior Functioning/Environment Level of Independence: Independent with assistive device(s)        Comments: Pt uses [employer] PTA. Drives, cooks, cleans and loves to sew.    OT Diagnosis: Generalized weakness;Acute pain   OT Problem List: Decreased strength;Decreased activity tolerance;Impaired balance (sitting and/or standing);Decreased safety awareness;Decreased knowledge of use of DME or AE;Decreased knowledge of precautions;Pain   OT Treatment/Interventions: Self-care/ADL training;Energy conservation;DME and/or AE instruction;Therapeutic activities;Patient/family education;Balance training    OT Goals(Current goals can be found in the care plan section) Acute Rehab OT Goals Patient Stated Goal: Go home OT Goal Formulation: With patient Time For Goal Achievement: 03/31/14 Potential to Achieve Goals: Good ADL Goals Pt Will Perform Lower Body Bathing: with modified independence;sit to/from stand Pt Will Perform Lower Body Dressing: with modified independence;sit to/from stand Pt Will Transfer to Toilet: with modified independence;bedside commode;ambulating Pt Will Perform Toileting - Clothing Manipulation and hygiene: with modified independence;sit to/from stand Pt Will Perform Tub/Shower Transfer: Tub transfer;ambulating;tub bench;rolling walker;with modified independence  OT Frequency: Min 2X/week   Barriers to D/C: None knowna at this time          End of Session Equipment Utilized During Treatment: Rolling walker  Activity Tolerance: Patient tolerated treatment well Patient  left: in chair;with call bell/phone within reach;with chair alarm set   Time: 1829-9371 OT Time Calculation (min): 12 min Charges:  OT General Charges $OT Visit: 1 Procedure OT Evaluation $Initial OT Evaluation Tier I: 1 Procedure G-Codes: OT G-codes **NOT FOR INPATIENT CLASS** Functional Limitation: Self care Self Care Current Status (I9678): At least 1 percent but less than 20 percent impaired, limited or restricted Self Care Goal Status (L3810): At least 1 percent but less than 20 percent impaired, limited or restricted  Bobbiejo Ishikawa , MS, OTR/L, CLT Pager: [phone]  03/24/2014, 1:39 PM

## 2014-03-24 NOTE — Evaluation (Signed)
Physical Therapy Evaluation Patient Details Name: [name] MRN: [id number] DOB: 31-Dec-1945 Today's Date: 03/24/2014   History of Present Illness  Patient is a 69 y/o male s/p Right L3-4, 4-5, L5-S1 LAMINECTOMY AND FORAMINOTOMY. PMH of HTN, pacemaker, MI, CHF, DM and CAD.  Clinical Impression  Patient presents with pain, generalized weakness and balance deficits s/p above surgery. Pt tolerated ambulation with Min guard assist for safety. Education provided on back precautions. Pt would benefit from skilled PT to improve transfers, gait, balance and mobility so pt can maximize independence and return to PLOF. Pt reports he can have his niece assist at home as needed at discharge esp for IADLs.     Follow Up Recommendations Home health PT;Supervision/Assistance - 24 hour    Equipment Recommendations  None recommended by PT    Recommendations for Other Services       Precautions / Restrictions Precautions Precautions: Back;Fall Precaution Booklet Issued: Yes (comment) Precaution Comments: reviewed back precautions and handout. Restrictions Weight Bearing Restrictions: No      Mobility  Bed Mobility Overal bed mobility: Needs Assistance Bed Mobility: Rolling;Sidelying to Sit Rolling: Supervision Sidelying to sit: Min assist;HOB elevated       General bed mobility comments: HOB elevated 30 degrees. Use of rails for support. Cues for log roll technique.   Transfers Overall transfer level: Needs assistance Equipment used: Rolling walker (2 wheeled) Transfers: Sit to/from Stand Sit to Stand: Min guard         General transfer comment: Use of momentum to stand. Stood from [employer].   Ambulation/Gait Ambulation/Gait assistance: Min guard Ambulation Distance (Feet): 150 Feet Assistive device: Rolling walker (2 wheeled) Gait Pattern/deviations: Step-through pattern;Decreased stride length   Gait velocity interpretation: Below normal speed for age/gender General Gait  Details: Pt with slow, unsteady gait. Dyspnea present. Cues for upright posture.  Stairs            Wheelchair Mobility    Modified Rankin (Stroke Patients Only)       Balance Overall balance assessment: Needs assistance Sitting-balance support: Feet supported;No upper extremity supported Sitting balance-Leahy Scale: Good     Standing balance support: During functional activity Standing balance-Leahy Scale: Fair                               Pertinent Vitals/Pain Pain Assessment: 0-10 Pain Score: 7  Pain Location: back Pain Descriptors / Indicators: Sore Pain Intervention(s): Monitored during session;Premedicated before session;Repositioned    Home Living Family/patient expects to be discharged to:: Private residence Living Arrangements: Spouse/significant other Available Help at Discharge: Family;Available PRN/intermittently Type of Home: House Home Access: Ramped entrance     Home Layout: One level Home Equipment: Walker - 2 wheels;Walker - standard;Bedside commode;Toilet riser;Tub bench;Hospital bed;Wheelchair - manual;Wheelchair - power Additional Comments: 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 Wife has PCA come in 5 days/week. Pt reports his niece could come stay and help out a few times a week.    Prior Function Level of Independence: Independent with assistive device(s)         Comments: Pt uses [employer]. Drives, cooks, cleans and loves to sew.     Hand Dominance        Extremity/Trunk Assessment   Upper Extremity Assessment: Defer to OT evaluation           Lower Extremity Assessment: Generalized weakness;RLE deficits/detail;LLE deficits/detail         Communication   Communication:  No difficulties  Cognition Arousal/Alertness: Awake/alert Behavior During Therapy: WFL for tasks assessed/performed Overall Cognitive Status: Within Functional Limits for tasks assessed       Memory: Decreased recall of precautions               General Comments      Exercises        Assessment/Plan    PT Assessment Patient needs continued PT services  PT Diagnosis Difficulty walking;Generalized weakness;Acute pain   PT Problem List Decreased strength;Decreased mobility;Decreased knowledge of precautions;Cardiopulmonary status limiting activity;Decreased balance;Pain  PT Treatment Interventions DME instruction;Therapeutic activities;Gait training;Therapeutic exercise;Patient/family education;Balance training;Functional mobility training   PT Goals (Current goals can be found in the Care Plan section) Acute Rehab PT Goals Patient Stated Goal: to return home tomorrow PT Goal Formulation: With patient Time For Goal Achievement: 04/07/14 Potential to Achieve Goals: Fair    Frequency Min 5X/week   Barriers to discharge Decreased caregiver support 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 Has a niece that can provide some assist at d/c.    Co-evaluation               End of Session Equipment Utilized During Treatment: Gait belt Activity Tolerance: Patient tolerated treatment well Patient left: in chair;with call bell/phone within reach;with chair alarm set;with nursing/sitter in room Nurse Communication: Mobility status    Functional Assessment Tool Used: Clinical judgment Functional Limitation: Mobility: Walking and moving around Mobility: Walking and Moving Around Current Status (O7564): At least 20 percent but less than 40 percent impaired, limited or restricted Mobility: Walking and Moving Around Goal Status [phone]): At least 1 percent but less than 20 percent impaired, limited or restricted    Time: 1884-1660 PT Time Calculation (min) (ACUTE ONLY): 23 min   Charges:   PT Evaluation $Initial PT Evaluation Tier I: 1 Procedure PT Treatments $Self Care/Home Management: 8-22   PT G Codes:   PT G-Codes **NOT FOR INPATIENT CLASS** Functional Assessment Tool Used: Clinical judgment Functional Limitation: Mobility: Walking  and moving around Mobility: Walking and Moving Around Current Status (Y3016): At least 20 percent but less than 40 percent impaired, limited or restricted Mobility: Walking and Moving Around Goal Status [phone]): At least 1 percent but less than 20 percent impaired, limited or restricted    Candy Sledge A 03/24/2014, 11:26 AM Candy Sledge, PT, DPT [phone]

## 2014-03-24 NOTE — Progress Notes (Signed)
Patient is active with [facility] for DM management and education. Patient could benefit from home health as well at home post hospital discharge. Patient had physical therapy in the recent past. However, it was discontinued due to worsening back/leg pain. Email notification sent to inpatient RNCM to make aware that [facility] is following and request for home health at discharge. Will continue to follow.  Marthenia Rolling, MSN- [facility] [phone]

## 2014-03-24 NOTE — Progress Notes (Signed)
UR completed 

## 2014-03-24 NOTE — Progress Notes (Signed)
Postop day 1. Patient complains of incisional discomfort. Some intermittent right lower extremity pain when lying in bed. He's been up ambulating with therapy today. His mobility is marginal for discharge home.  Afebrile. Vitals are stable. Wound clean and dry. Motor and sensory exam intact. (Improved from preop). Chest and abdomen benign. No cardiac events by monitor.  Overall doing well. Continue efforts at mobilization. Possible discharge tomorrow. We will restart Coumadin upon discharge.

## 2014-03-25 ENCOUNTER — Telehealth: Payer: Self-pay | Admitting: Pharmacist Clinician (PhC)/ Clinical Pharmacy Specialist

## 2014-03-25 DIAGNOSIS — I10 Essential (primary) hypertension: Secondary | ICD-10-CM | POA: Diagnosis not present

## 2014-03-25 DIAGNOSIS — M4806 Spinal stenosis, lumbar region: Secondary | ICD-10-CM | POA: Diagnosis not present

## 2014-03-25 DIAGNOSIS — E785 Hyperlipidemia, unspecified: Secondary | ICD-10-CM | POA: Diagnosis not present

## 2014-03-25 DIAGNOSIS — M4726 Other spondylosis with radiculopathy, lumbar region: Secondary | ICD-10-CM | POA: Diagnosis not present

## 2014-03-25 DIAGNOSIS — Z87891 Personal history of nicotine dependence: Secondary | ICD-10-CM | POA: Diagnosis not present

## 2014-03-25 DIAGNOSIS — Z955 Presence of coronary angioplasty implant and graft: Secondary | ICD-10-CM | POA: Diagnosis not present

## 2014-03-25 DIAGNOSIS — I739 Peripheral vascular disease, unspecified: Secondary | ICD-10-CM | POA: Diagnosis not present

## 2014-03-25 DIAGNOSIS — Z950 Presence of cardiac pacemaker: Secondary | ICD-10-CM | POA: Diagnosis not present

## 2014-03-25 DIAGNOSIS — I252 Old myocardial infarction: Secondary | ICD-10-CM | POA: Diagnosis not present

## 2014-03-25 DIAGNOSIS — F329 Major depressive disorder, single episode, unspecified: Secondary | ICD-10-CM | POA: Diagnosis not present

## 2014-03-25 DIAGNOSIS — Z7901 Long term (current) use of anticoagulants: Secondary | ICD-10-CM | POA: Diagnosis not present

## 2014-03-25 DIAGNOSIS — I5032 Chronic diastolic (congestive) heart failure: Secondary | ICD-10-CM | POA: Diagnosis not present

## 2014-03-25 DIAGNOSIS — E119 Type 2 diabetes mellitus without complications: Secondary | ICD-10-CM | POA: Diagnosis not present

## 2014-03-25 DIAGNOSIS — G4730 Sleep apnea, unspecified: Secondary | ICD-10-CM | POA: Diagnosis not present

## 2014-03-25 DIAGNOSIS — M1990 Unspecified osteoarthritis, unspecified site: Secondary | ICD-10-CM | POA: Diagnosis not present

## 2014-03-25 MED ORDER — OXYCODONE-ACETAMINOPHEN 5-325 MG PO TABS: 1.0000 | ORAL_TABLET | ORAL | Status: DC | PRN

## 2014-03-25 NOTE — Progress Notes (Signed)
Occupational Therapy Treatment Patient Details Name: [name] MRN: [id number] DOB: May 22, 1945 Today's Date: 03/25/2014    History of present illness Patient is a 69 y/o male s/p Right L3-4, 4-5, L5-S1 LAMINECTOMY AND FORAMINOTOMY. PMH of HTN, pacemaker, MI, CHF, DM and CAD.   OT comments  Pt progressing towards acute OT goals. Practiced toilet, shower transfers, bed mobility and LB dressing. Educated on back precautions and functional compensatory strategies as well as safety with home setup.  Follow Up Recommendations  No OT follow up;Supervision/Assistance - 24 hour    Equipment Recommendations       Recommendations for Other Services      Precautions / Restrictions Precautions Precautions: Back;Fall Precaution Comments: reviewed       Mobility Bed Mobility Overal bed mobility: Needs Assistance Bed Mobility: Rolling;Sidelying to Sit;Sit to Sidelying Rolling: Supervision Sidelying to sit: Supervision     Sit to sidelying: Supervision General bed mobility comments: did not use bed rails  Transfers Overall transfer level: Modified independent Equipment used: Rolling walker (2 wheeled) Transfers: Sit to/from Stand Sit to Stand: Supervision         General transfer comment: Patient with safe technique    Balance     Sitting balance-Leahy Scale: Good     Standing balance support: Bilateral upper extremity supported;During functional activity Standing balance-Leahy Scale: Fair                     ADL Overall ADL's : Needs assistance/impaired                     Lower Body Dressing: Supervision/safety;Sit to/from stand;Cueing for safety   Toilet Transfer: Supervision/safety;BSC;RW Toilet Transfer Details (indicate cue type and reason): practiced with 3n1 to assist with not bending during sitting/standing; pt stated he has one at home     Tub/ Shower Transfer: Tub transfer;Supervision/safety;Ambulation;Tub bench;Rolling  walker Tub/Shower Transfer Details (indicate cue type and reason): practiced simulated home setup for shower transfer Functional mobility during ADLs: Supervision/safety;Rolling walker General ADL Comments: Worked on maintaining a more upright posture during sit<>stands to avoid bending. Educated on safety with ADLs, home setup and transfers. Reviewed precautions and compensatory strategies.      Vision                     Perception     Praxis      Cognition   Behavior During Therapy: WFL for tasks assessed/performed Overall Cognitive Status: Within Functional Limits for tasks assessed       Memory: Decreased recall of precautions               Extremity/Trunk Assessment               Exercises     Shoulder Instructions       General Comments      Pertinent Vitals/ Pain       Pain Assessment: 0-10 Pain Score: 2  Pain Location: back Pain Descriptors / Indicators: Sore Pain Intervention(s): Monitored during session;Premedicated before session;Repositioned  Home Living                                          Prior Functioning/Environment              Frequency Min 2X/week     Progress Toward Goals  OT Goals(current goals can now  be found in the care plan section)  Progress towards OT goals: Progressing toward goals  Acute Rehab OT Goals Patient Stated Goal: Go home OT Goal Formulation: With patient Time For Goal Achievement: 03/31/14 Potential to Achieve Goals: Good ADL Goals Pt Will Perform Lower Body Bathing: with modified independence;sit to/from stand Pt Will Perform Lower Body Dressing: with modified independence;sit to/from stand Pt Will Transfer to Toilet: with modified independence;bedside commode;ambulating Pt Will Perform Toileting - Clothing Manipulation and hygiene: with modified independence;sit to/from stand Pt Will Perform Tub/Shower Transfer: Tub transfer;ambulating;tub bench;rolling walker;with  modified independence  Plan Discharge plan remains appropriate    Co-evaluation                 End of Session Equipment Utilized During Treatment: Rolling walker   Activity Tolerance Patient tolerated treatment well   Patient Left in chair;with call bell/phone within reach;with chair alarm set   Nurse Communication      Functional Assessment Tool Used: clinical judgement Functional Limitation: Self care Self Care Current Status [phone]): At least 1 percent but less than 20 percent impaired, limited or restricted Self Care Goal Status (X9147): At least 1 percent but less than 20 percent impaired, limited or restricted   Time: 0916-0939 OT Time Calculation (min): 23 min  Charges: OT G-codes **NOT FOR INPATIENT CLASS** Functional Assessment Tool Used: clinical judgement Functional Limitation: Self care Self Care Current Status (W2956): At least 1 percent but less than 20 percent impaired, limited or restricted Self Care Goal Status (O1308): At least 1 percent but less than 20 percent impaired, limited or restricted OT General Charges $OT Visit: 1 Procedure OT Treatments $Self Care/Home Management : 23-37 mins  Hortencia Pilar 03/25/2014, 12:04 PM

## 2014-03-25 NOTE — Progress Notes (Signed)
Discharge orders received.  Discharge instructions and follow-up appointments reviewed with the patient.  VSS upon discharge.  IV removed and education complete.  Equipment given to the patient.  Transported out via wheelchair. Cori Razor, RN

## 2014-03-25 NOTE — Discharge Instructions (Signed)
RESUME COUMADIN ON REGULAR SCHEDULE  Wound Care Keep incision covered and dry for two days.  If you shower, cover incision with plastic wrap.  Do not put any creams, lotions, or ointments on incision. Leave steri-strips on back.  They will fall off by themselves. Activity Walk each and every day, increasing distance each day. No lifting greater than 5 lbs.  Avoid excessive neck motion. No driving for 2 weeks; may ride as a passenger locally. If provided with back brace, wear when out of bed.  It is not necessary to wear brace in bed. Diet Resume your normal diet.  Return to Work Will be discussed at you follow up appointment. Call Your Doctor If Any of These Occur Redness, drainage, or swelling at the wound.  Temperature greater than 101 degrees. Severe pain not relieved by pain medication. Incision starts to come apart. Follow Up Appt Call today for appointment in 1-2 weeks ([phone]) or for problems.  If you have any hardware placed in your spine, you will need an x-ray before your appointment.

## 2014-03-25 NOTE — Discharge Summary (Addendum)
Physician Discharge Summary  Patient ID: [name] MRN: [id number] DOB/AGE: 02-11-1945 69 y.o.  Admit date: 03/23/2014 Discharge date: 03/25/2014  Admission Diagnoses:  Discharge Diagnoses:  Principal Problem:   Spinal stenosis, lumbar region, with neurogenic claudication Active Problems:   Lumbar stenosis with neurogenic claudication   Discharged Condition: good  Hospital Course: The patient was noted to the hospital where he underwent an uncomplicated right-sided Z0-2, L4-5, L5-S1 decompressive surgery. Postoperatively he is doing very well. Preoperative right lower extremity pain very much improved. Standing and walking better. Ready for discharge home.  Consults:   Significant Diagnostic Studies:   Treatments:   Discharge Exam: Blood pressure 115/71, pulse 65, temperature 97.7 F (36.5 C), temperature source Oral, resp. rate 18, height 5\' 11"  (1.803 m), weight 100.245 kg (221 lb), SpO2 95 %. Awake and alert. Oriented and appropriate. Motor and sensory function intact. Wound clean and dry. Chest and abdomen benign.  Disposition: ED Dismiss - Never Arrived     Medication List    STOP taking these medications        enoxaparin 100 MG/ML injection  Commonly known as:  LOVENOX      TAKE these medications        allopurinol 300 MG tablet  Commonly known as:  ZYLOPRIM  take 1 tablet by mouth once daily     atorvastatin 80 MG tablet  Commonly known as:  LIPITOR  Take 1 tablet (80 mg total) by mouth daily.     carvedilol 25 MG tablet  Commonly known as:  COREG  Take 1 tablet (25 mg total) by mouth 2 (two) times daily.     fluticasone 50 MCG/ACT nasal spray  Commonly known as:  FLONASE  Place 2 sprays into both nostrils 2 (two) times daily.     furosemide 40 MG tablet  Commonly known as:  LASIX  Take 1 tablet (40 mg total) by mouth daily.     gabapentin 300 MG capsule  Commonly known as:  NEURONTIN  Take 2 capsules (600 mg total) by mouth 3 (three)  times daily.     glipiZIDE 10 MG 24 hr tablet  Commonly known as:  GLUCOTROL XL  Take 1 tablet (10 mg total) by mouth daily with breakfast.     glucose blood test strip  Commonly known as:  ONE TOUCH ULTRA TEST  - Use to check blood sugar twice a day  - Dx E11.40     ibuprofen 200 MG tablet  Commonly known as:  ADVIL,MOTRIN  Take 800 mg by mouth daily as needed for mild pain or moderate pain.     lisinopril 2.5 MG tablet  Commonly known as:  PRINIVIL,ZESTRIL  Take 1 tablet (2.5 mg total) by mouth daily.     metFORMIN 500 MG tablet  Commonly known as:  GLUCOPHAGE  Take 2 tablets (1,000 mg total) by mouth 2 (two) times daily.     multivitamin with minerals Tabs tablet  Take 1 tablet by mouth every morning.     oxyCODONE-acetaminophen 5-325 MG per tablet  Commonly known as:  PERCOCET/ROXICET  Take 1-2 tablets by mouth every 4 (four) hours as needed for moderate pain.     potassium chloride SA 20 MEQ tablet  Commonly known as:  K-DUR,KLOR-CON  Take 1 tablet (20 mEq total) by mouth every evening.     predniSONE 50 MG tablet  Commonly known as:  DELTASONE  Take 1 tablet (50 mg total) by mouth daily.  sitaGLIPtin 100 MG tablet  Commonly known as:  JANUVIA  Take 1 tablet (100 mg total) by mouth daily.     tamsulosin 0.4 MG Caps capsule  Commonly known as:  FLOMAX  Take 1 capsule (0.4 mg total) by mouth every 12 (twelve) hours.     tiZANidine 4 MG tablet  Commonly known as:  ZANAFLEX  Take 1 tablet (4 mg total) by mouth every 6 (six) hours as needed for muscle spasms.     traMADol 50 MG tablet  Commonly known as:  ULTRAM  Take 1 tablet (50 mg total) by mouth every 12 (twelve) hours as needed for moderate pain or severe pain.     Vitamin D 1000 UNITS capsule  Take 1,000 Units by mouth daily.     warfarin 5 MG tablet  Commonly known as:  COUMADIN  Take 1 tablet (5 mg total) by mouth daily.           Follow-up Information    Follow up with Charlie Pitter, MD.    Specialty:  Neurosurgery   Contact information:   [address] [place] [address] [phone]       Signed: Charlie Pitter 03/25/2014, 8:20 AM

## 2014-03-25 NOTE — Progress Notes (Signed)
Physical Therapy Treatment Patient Details Name: [name] MRN: [id number] DOB: Feb 10, 1945 Today's Date: 03/25/2014    History of Present Illness Patient is a 69 y/o male s/p Right L3-4, 4-5, L5-S1 LAMINECTOMY AND FORAMINOTOMY. PMH of HTN, pacemaker, MI, CHF, DM and CAD.    PT Comments    Patient progressing well with ambulation. Eager to DC home. Patient safe to D/C from a mobility standpoint based on progression towards goals set on PT eval. Recommend RW as patient stated that his RW at home is not safe and over 9 years old. Will update case manager   Follow Up Recommendations  Home health PT;Supervision/Assistance - 24 hour     Equipment Recommendations  RW with 5 inch wheels.     Recommendations for Other Services       Precautions / Restrictions Precautions Precautions: Back;Fall Precaution Comments: reviewed back precautions and handout.  Restrictions Weight Bearing Restrictions: No    Mobility  Bed Mobility               General bed mobility comments: Patient sitting up EOB but able to teach back to me log roll technique he used   Transfers Overall transfer level: Modified independent               General transfer comment: Patient with safe technique  Ambulation/Gait Ambulation/Gait assistance: Supervision Ambulation Distance (Feet): 300 Feet Assistive device: Rolling walker (2 wheeled) Gait Pattern/deviations: Step-through pattern;Decreased stride length     General Gait Details: Better quality of gait this session. Cues for upright posture and safety with RW   Stairs            Wheelchair Mobility    Modified Rankin (Stroke Patients Only)       Balance                                    Cognition Arousal/Alertness: Awake/alert Behavior During Therapy: WFL for tasks assessed/performed Overall Cognitive Status: Within Functional Limits for tasks assessed                      Exercises       General Comments        Pertinent Vitals/Pain Pain Assessment: No/denies pain    Home Living                      Prior Function            PT Goals (current goals can now be found in the care plan section) Progress towards PT goals: Progressing toward goals    Frequency  Min 5X/week    PT Plan Current plan remains appropriate    Co-evaluation             End of Session Equipment Utilized During Treatment: Gait belt Activity Tolerance: Patient tolerated treatment well Patient left: in chair;with call bell/phone within reach     Time: 0831-0845 PT Time Calculation (min) (ACUTE ONLY): 14 min  Charges:  $Gait Training: 8-22 mins                    G Codes:      Jacqualyn Posey 03/25/2014, 8:50 AM

## 2014-03-25 NOTE — Telephone Encounter (Signed)
Pt now 2 days post surgery (laminectomy), wants to know when to restart warfarin.  No mention on d/c paperwork from hospital other than to restart warfarin.  Advised pt to take 5 mg x 3 days then resume previous dose of 2.5 mg qd x 5 mg MWF.  Scheduled INR for next Wednesday

## 2014-03-25 NOTE — Progress Notes (Signed)
Talked to patient about [facility] choices, patient requested [facility]; Miranda with [facility] called for arrangements; RW ordered to be delivered to the room prior to discharging home todayAneta Collier [phone]

## 2014-03-26 DIAGNOSIS — I10 Essential (primary) hypertension: Secondary | ICD-10-CM | POA: Diagnosis not present

## 2014-03-26 DIAGNOSIS — E119 Type 2 diabetes mellitus without complications: Secondary | ICD-10-CM | POA: Diagnosis not present

## 2014-03-26 DIAGNOSIS — Z4789 Encounter for other orthopedic aftercare: Secondary | ICD-10-CM | POA: Diagnosis not present

## 2014-03-26 DIAGNOSIS — F329 Major depressive disorder, single episode, unspecified: Secondary | ICD-10-CM | POA: Diagnosis not present

## 2014-03-26 DIAGNOSIS — E785 Hyperlipidemia, unspecified: Secondary | ICD-10-CM | POA: Diagnosis not present

## 2014-03-26 DIAGNOSIS — I5032 Chronic diastolic (congestive) heart failure: Secondary | ICD-10-CM | POA: Diagnosis not present

## 2014-03-27 ENCOUNTER — Telehealth: Payer: Self-pay | Admitting: *Deleted

## 2014-03-27 NOTE — Telephone Encounter (Signed)
Pt was on TCM list d/c form hosp 03/26/14 underwent (R) side L3-4, L4-5, and L5S1 decompressive surgery. Will f/u with neurosurgeon Dr. Annette Stable...[name]

## 2014-03-30 ENCOUNTER — Ambulatory Visit: Payer: Medicare Other | Admitting: Pharmacist Clinician (PhC)/ Clinical Pharmacy Specialist

## 2014-03-31 DIAGNOSIS — I10 Essential (primary) hypertension: Secondary | ICD-10-CM | POA: Diagnosis not present

## 2014-03-31 DIAGNOSIS — I5032 Chronic diastolic (congestive) heart failure: Secondary | ICD-10-CM | POA: Diagnosis not present

## 2014-03-31 DIAGNOSIS — E785 Hyperlipidemia, unspecified: Secondary | ICD-10-CM | POA: Diagnosis not present

## 2014-03-31 DIAGNOSIS — F329 Major depressive disorder, single episode, unspecified: Secondary | ICD-10-CM | POA: Diagnosis not present

## 2014-03-31 DIAGNOSIS — Z0279 Encounter for issue of other medical certificate: Secondary | ICD-10-CM

## 2014-03-31 DIAGNOSIS — Z4789 Encounter for other orthopedic aftercare: Secondary | ICD-10-CM | POA: Diagnosis not present

## 2014-03-31 DIAGNOSIS — E119 Type 2 diabetes mellitus without complications: Secondary | ICD-10-CM | POA: Diagnosis not present

## 2014-04-01 ENCOUNTER — Ambulatory Visit (INDEPENDENT_AMBULATORY_CARE_PROVIDER_SITE_OTHER): Payer: Medicare Other | Admitting: Pharmacist Clinician (PhC)/ Clinical Pharmacy Specialist

## 2014-04-01 DIAGNOSIS — I483 Typical atrial flutter: Secondary | ICD-10-CM

## 2014-04-01 DIAGNOSIS — Z7901 Long term (current) use of anticoagulants: Secondary | ICD-10-CM | POA: Diagnosis not present

## 2014-04-01 LAB — POCT INR: INR: 1.8

## 2014-04-02 DIAGNOSIS — E785 Hyperlipidemia, unspecified: Secondary | ICD-10-CM | POA: Diagnosis not present

## 2014-04-02 DIAGNOSIS — E119 Type 2 diabetes mellitus without complications: Secondary | ICD-10-CM | POA: Diagnosis not present

## 2014-04-02 DIAGNOSIS — I10 Essential (primary) hypertension: Secondary | ICD-10-CM | POA: Diagnosis not present

## 2014-04-02 DIAGNOSIS — F329 Major depressive disorder, single episode, unspecified: Secondary | ICD-10-CM | POA: Diagnosis not present

## 2014-04-02 DIAGNOSIS — I5032 Chronic diastolic (congestive) heart failure: Secondary | ICD-10-CM | POA: Diagnosis not present

## 2014-04-02 DIAGNOSIS — Z4789 Encounter for other orthopedic aftercare: Secondary | ICD-10-CM | POA: Diagnosis not present

## 2014-04-03 ENCOUNTER — Ambulatory Visit: Payer: Medicare Other | Admitting: Internal Medicine

## 2014-04-07 DIAGNOSIS — E119 Type 2 diabetes mellitus without complications: Secondary | ICD-10-CM | POA: Diagnosis not present

## 2014-04-07 DIAGNOSIS — F329 Major depressive disorder, single episode, unspecified: Secondary | ICD-10-CM | POA: Diagnosis not present

## 2014-04-07 DIAGNOSIS — I10 Essential (primary) hypertension: Secondary | ICD-10-CM | POA: Diagnosis not present

## 2014-04-07 DIAGNOSIS — I5032 Chronic diastolic (congestive) heart failure: Secondary | ICD-10-CM | POA: Diagnosis not present

## 2014-04-07 DIAGNOSIS — Z4789 Encounter for other orthopedic aftercare: Secondary | ICD-10-CM | POA: Diagnosis not present

## 2014-04-07 DIAGNOSIS — E785 Hyperlipidemia, unspecified: Secondary | ICD-10-CM | POA: Diagnosis not present

## 2014-04-09 DIAGNOSIS — E119 Type 2 diabetes mellitus without complications: Secondary | ICD-10-CM | POA: Diagnosis not present

## 2014-04-09 DIAGNOSIS — I10 Essential (primary) hypertension: Secondary | ICD-10-CM | POA: Diagnosis not present

## 2014-04-09 DIAGNOSIS — Z4789 Encounter for other orthopedic aftercare: Secondary | ICD-10-CM | POA: Diagnosis not present

## 2014-04-09 DIAGNOSIS — E785 Hyperlipidemia, unspecified: Secondary | ICD-10-CM | POA: Diagnosis not present

## 2014-04-09 DIAGNOSIS — I5032 Chronic diastolic (congestive) heart failure: Secondary | ICD-10-CM | POA: Diagnosis not present

## 2014-04-09 DIAGNOSIS — F329 Major depressive disorder, single episode, unspecified: Secondary | ICD-10-CM | POA: Diagnosis not present

## 2014-04-10 ENCOUNTER — Ambulatory Visit: Payer: Medicare Other | Admitting: Internal Medicine

## 2014-04-14 ENCOUNTER — Telehealth: Payer: Self-pay

## 2014-04-14 DIAGNOSIS — I10 Essential (primary) hypertension: Secondary | ICD-10-CM | POA: Diagnosis not present

## 2014-04-14 DIAGNOSIS — E119 Type 2 diabetes mellitus without complications: Secondary | ICD-10-CM | POA: Diagnosis not present

## 2014-04-14 DIAGNOSIS — Z4789 Encounter for other orthopedic aftercare: Secondary | ICD-10-CM | POA: Diagnosis not present

## 2014-04-14 DIAGNOSIS — E785 Hyperlipidemia, unspecified: Secondary | ICD-10-CM | POA: Diagnosis not present

## 2014-04-14 DIAGNOSIS — F329 Major depressive disorder, single episode, unspecified: Secondary | ICD-10-CM | POA: Diagnosis not present

## 2014-04-14 DIAGNOSIS — I5032 Chronic diastolic (congestive) heart failure: Secondary | ICD-10-CM | POA: Diagnosis not present

## 2014-04-14 NOTE — Telephone Encounter (Signed)
Mailed back to [employer] Patient Assistance Program to Enrollment form

## 2014-04-15 ENCOUNTER — Ambulatory Visit (INDEPENDENT_AMBULATORY_CARE_PROVIDER_SITE_OTHER): Payer: Medicare Other | Admitting: Pharmacist Clinician (PhC)/ Clinical Pharmacy Specialist

## 2014-04-15 DIAGNOSIS — I483 Typical atrial flutter: Secondary | ICD-10-CM | POA: Diagnosis not present

## 2014-04-15 DIAGNOSIS — Z7901 Long term (current) use of anticoagulants: Secondary | ICD-10-CM

## 2014-04-15 LAB — POCT INR: INR: 3.3

## 2014-04-17 DIAGNOSIS — E119 Type 2 diabetes mellitus without complications: Secondary | ICD-10-CM | POA: Diagnosis not present

## 2014-04-17 DIAGNOSIS — F329 Major depressive disorder, single episode, unspecified: Secondary | ICD-10-CM | POA: Diagnosis not present

## 2014-04-17 DIAGNOSIS — Z4789 Encounter for other orthopedic aftercare: Secondary | ICD-10-CM | POA: Diagnosis not present

## 2014-04-17 DIAGNOSIS — E785 Hyperlipidemia, unspecified: Secondary | ICD-10-CM | POA: Diagnosis not present

## 2014-04-17 DIAGNOSIS — I5032 Chronic diastolic (congestive) heart failure: Secondary | ICD-10-CM | POA: Diagnosis not present

## 2014-04-17 DIAGNOSIS — I10 Essential (primary) hypertension: Secondary | ICD-10-CM | POA: Diagnosis not present

## 2014-04-22 ENCOUNTER — Ambulatory Visit: Payer: Medicare Other | Admitting: Internal Medicine

## 2014-04-23 ENCOUNTER — Telehealth: Payer: Self-pay | Admitting: Family Medicine

## 2014-04-23 ENCOUNTER — Ambulatory Visit (INDEPENDENT_AMBULATORY_CARE_PROVIDER_SITE_OTHER): Payer: Medicare Other | Admitting: Internal Medicine

## 2014-04-23 ENCOUNTER — Encounter: Payer: Self-pay | Admitting: Internal Medicine

## 2014-04-23 VITALS — BP 102/48 | HR 98 | Temp 98.2°F | Wt 206.1 lb

## 2014-04-23 DIAGNOSIS — IMO0002 Reserved for concepts with insufficient information to code with codable children: Secondary | ICD-10-CM

## 2014-04-23 DIAGNOSIS — E1140 Type 2 diabetes mellitus with diabetic neuropathy, unspecified: Secondary | ICD-10-CM

## 2014-04-23 DIAGNOSIS — E1165 Type 2 diabetes mellitus with hyperglycemia: Secondary | ICD-10-CM

## 2014-04-23 DIAGNOSIS — E785 Hyperlipidemia, unspecified: Secondary | ICD-10-CM | POA: Diagnosis not present

## 2014-04-23 DIAGNOSIS — I10 Essential (primary) hypertension: Secondary | ICD-10-CM | POA: Diagnosis not present

## 2014-04-23 NOTE — Progress Notes (Signed)
Subjective:    Patient ID: [name], male    DOB: 1946-01-17, 69 y.o.   MRN: [id number]  HPI  Here post lumbar laminectomy, no falls and overall back and RLE pain much improved.  overall doing ok,  Pt denies chest pain, increasing sob or doe, wheezing, orthopnea, PND, increased LE swelling, palpitations, dizziness or syncope.  Pt denies new neurological symptoms such as new headache, or facial or extremity weakness or numbness.  Pt denies polydipsia, polyuria, or low sugar episode after increased glucotrol last visit. CBG's in low 100's., also with over 10 lbs wt loss around the surgury, and has not regained..   Pt denies new neurological symptoms such as new headache, or facial or extremity weakness or numbness.   Pt states overall good compliance with meds,  Past Medical History  Diagnosis Date  . 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 HYPERLIPIDEMIA 08/29/2006    takes Atorvastatin daily  . GOUT 04/22/2007    takes Allopurinol daily  . DEPRESSION 08/29/2006  . 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 DIASTOLIC HEART FAILURE, CHRONIC 06/09/2008  . BENIGN PROSTATIC HYPERTROPHY 08/29/2006    takes Flomax daily  . LUMBAR RADICULOPATHY, RIGHT 06/10/2007  . INSOMNIA-SLEEP DISORDER-UNSPEC 10/23/2007  . PACEMAKER, PERMANENT 03/04/2008    pt denies this date  . Left lumbar radiculopathy 05/30/2010  . Sleep apnea     "if I lay flat I quit breathing; HOB up I'm fine"  . Arthritis     "lower back; going back down both my sciatic nerves"  . Aortic root dilatation 02/02/2011  . HYPERTENSION 08/29/2006    takes Lisinopril daily  . Myocardial infarction 12/27/10    "I've had several MIs"  . Muscle spasm     takes Zanaflex daily  . CHF (congestive heart failure)     takes Lasix daily  . Shortness of breath dyspnea     "all my life" with exertion  . History of migraine     50yrs ago  . DIABETES MELLITUS, TYPE II 08/29/2006    takes Metformin,Januvia,and Glipizide  daily  . CORONARY  ARTERY DISEASE 08/29/2006    takes Coumadin daily  . 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 Peripheral neuropathy     takes Gabapentin daily  . Chronic back pain     HNP   . Cataracts, bilateral     immature  . Presence of permanent cardiac pacemaker    Past Surgical History  Procedure Laterality Date  . S/p left arm surgury after work accident  1991    "2000# steel fell on it"  . S/p right hand surgury for foreign object  1970's    "piece of wood went in my hand; had to get that out"  . Insert / replace / remove pacemaker  ~ 2004    initial pacemaker placement  . Insert / replace / remove pacemaker  10/2009    generator change  . Colonoscopy    . Esophagogastroduodenoscopy N/A 12/01/2013    Procedure: ESOPHAGOGASTRODUODENOSCOPY (EGD);  Surgeon: Lafayette Dragon, MD;  Location: [facility];  Service: Endoscopy;  Laterality: N/A;  . Coronary artery bypass graft  1992    CABG X 2  . Coronary angioplasty with stent placement  12/27/10    "I've had a total of 9 cardiac stents put in"  . Lumbar laminectomy/decompression microdiscectomy Right 03/23/2014    Procedure: LAMINECTOMY AND FORAMINOTOMY RIGHT LUMBAR THREE-FOUR,LUMBAR FOUR-FIVE, LUMBAR FIVE-SACRAL ONE;  Surgeon: Charlie Pitter, MD;  Location: [facility]  NEURO ORS;  Service: Neurosurgery;  Laterality: Right;  right    reports that he has quit smoking. His smoking use included Cigarettes. He has a 27 pack-year smoking history. He has quit using smokeless tobacco. He reports that he does not drink alcohol or use illicit drugs. family history includes Coronary artery disease (age of onset: 16) in his other; Diabetes in his mother, other, and sister; Heart disease in his sister and sister; Lung cancer in his sister. Allergies  Allergen Reactions  . Crestor [Rosuvastatin Calcium] Other (See Comments)    Urination of blood   Current Outpatient Prescriptions on File Prior to Visit  Medication Sig Dispense Refill  . allopurinol (ZYLOPRIM) 300 MG  tablet take 1 tablet by mouth once daily (Patient taking differently: Take 300 mg by mouth daily. ) 90 tablet 3  . atorvastatin (LIPITOR) 80 MG tablet Take 1 tablet (80 mg total) by mouth daily. 90 tablet 3  . carvedilol (COREG) 25 MG tablet Take 1 tablet (25 mg total) by mouth 2 (two) times daily. 180 tablet 3  . Cholecalciferol (VITAMIN D) 1000 UNITS capsule Take 1,000 Units by mouth daily.     . fluticasone (FLONASE) 50 MCG/ACT nasal spray Place 2 sprays into both nostrils 2 (two) times daily. 48 g 3  . furosemide (LASIX) 40 MG tablet Take 1 tablet (40 mg total) by mouth daily. 90 tablet 3  . gabapentin (NEURONTIN) 300 MG capsule Take 2 capsules (600 mg total) by mouth 3 (three) times daily. 540 capsule 3  . glipiZIDE (GLUCOTROL XL) 10 MG 24 hr tablet Take 1 tablet (10 mg total) by mouth daily with breakfast. 90 tablet 3  . glucose blood (ONE TOUCH ULTRA TEST) test strip Use to check blood sugar twice a day Dx E11.40 (Patient taking differently: 1 each by Other route See admin instructions. Check blood sugar 3 times daily) 300 each 3  . ibuprofen (ADVIL,MOTRIN) 200 MG tablet Take 800 mg by mouth daily as needed for mild pain or moderate pain.     Marland Kitchen lisinopril (PRINIVIL,ZESTRIL) 2.5 MG tablet Take 1 tablet (2.5 mg total) by mouth daily. 90 tablet 3  . metFORMIN (GLUCOPHAGE) 500 MG tablet Take 2 tablets (1,000 mg total) by mouth 2 (two) times daily. 360 tablet 3  . Multiple Vitamin (MULTIVITAMIN WITH MINERALS) TABS tablet Take 1 tablet by mouth every morning.    Marland Kitchen oxyCODONE-acetaminophen (PERCOCET/ROXICET) 5-325 MG per tablet Take 1-2 tablets by mouth every 4 (four) hours as needed for moderate pain. 80 tablet 0  . potassium chloride SA (K-DUR,KLOR-CON) 20 MEQ tablet Take 1 tablet (20 mEq total) by mouth every evening. 90 tablet 3  . predniSONE (DELTASONE) 50 MG tablet Take 1 tablet (50 mg total) by mouth daily. 5 tablet 0  . sitaGLIPtin (JANUVIA) 100 MG tablet Take 1 tablet (100 mg total) by mouth  daily. 90 tablet 3  . tamsulosin (FLOMAX) 0.4 MG CAPS capsule Take 1 capsule (0.4 mg total) by mouth every 12 (twelve) hours. (Patient taking differently: Take 0.4 mg by mouth daily. ) 90 capsule 3  . tiZANidine (ZANAFLEX) 4 MG tablet Take 1 tablet (4 mg total) by mouth every 6 (six) hours as needed for muscle spasms. 180 tablet 3  . traMADol (ULTRAM) 50 MG tablet Take 1 tablet (50 mg total) by mouth every 12 (twelve) hours as needed for moderate pain or severe pain. 60 tablet 2  . warfarin (COUMADIN) 5 MG tablet Take 1 tablet (5 mg total) by mouth  daily. (Patient taking differently: Take 5 mg by mouth daily. Take 2.5 mg by mouth on Sunday, Tuesday, Thursday, Friday and Saturday. Take 5mg  by mouth Monday and Wednesday.) 90 tablet 3   Current Facility-Administered Medications on File Prior to Visit  Medication Dose Route Frequency Provider Last Rate Last Dose  . enoxaparin (LOVENOX) injection 100 mg  1 mg/kg Subcutaneous Q12H Earnie Larsson, MD       Review of Systems  Constitutional: Negative for unusual diaphoresis or night sweats HENT: Negative for ringing in ear or discharge Eyes: Negative for double vision or worsening visual disturbance.  Respiratory: Negative for choking and stridor.   Gastrointestinal: Negative for vomiting or other signifcant bowel change Genitourinary: Negative for hematuria or change in urine volume.  Musculoskeletal: Negative for other MSK pain or swelling Skin: Negative for color change and worsening wound.  Neurological: Negative for tremors and numbness other than noted  Psychiatric/Behavioral: Negative for decreased concentration or agitation other than above       Objective:   Physical Exam BP 102/48 mmHg  Pulse 98  Temp(Src) 98.2 F (36.8 C)  Wt 206 lb 1.3 oz (93.477 kg)  SpO2 95% VS noted,  Constitutional: Pt appears in no significant distress HENT: Head: NCAT.  Right Ear: External ear normal.  Left Ear: External ear normal.  Eyes: . Pupils are  equal, round, and reactive to light. Conjunctivae and EOM are normal Neck: Normal range of motion. Neck supple.  Cardiovascular: Normal rate and regular rhythm.   Pulmonary/Chest: Effort normal and breath sounds without rales or wheezing.  Abd:  Soft, NT, ND, + BS Neurological: Pt is alert. Not confused , motor grossly intact Skin: Skin is warm. No rash, no LE edema Psychiatric: Pt behavior is normal. No agitation.    Wt Readings from Last 3 Encounters:  04/23/14 206 lb 1.3 oz (93.477 kg)  03/20/14 221 lb (100.245 kg)  03/11/14 215 lb (97.523 kg)       Assessment & Plan:

## 2014-04-23 NOTE — Telephone Encounter (Signed)
Patient just stopped by to let you know that his surgery went very well and to give him a call at [phone]  when you have a chance.

## 2014-04-23 NOTE — Assessment & Plan Note (Signed)
stable overall by history and exam, recent data reviewed with pt, and pt to continue medical treatment as before,  to f/u any worsening symptoms or concerns BP Readings from Last 3 Encounters:  04/23/14 102/48  03/20/14 126/72  02/24/14 104/62

## 2014-04-23 NOTE — Assessment & Plan Note (Signed)
Improved by hx, Please continue all other medications as before, o/w stable overall by history and exam, recent data reviewed with pt, and pt to continue medical treatment as before,  to f/u any worsening symptoms or concerns, f/u with labs next visit

## 2014-04-23 NOTE — Progress Notes (Signed)
Pre visit review using our clinic review tool, if applicable. No additional management support is needed unless otherwise documented below in the visit note. 

## 2014-04-23 NOTE — Assessment & Plan Note (Signed)
stable overall by history and exam, recent data reviewed with pt, and pt to continue medical treatment as before,  to f/u any worsening symptoms or concerns. Lab Results  Component Value Date   LDLCALC 39 02/24/2014

## 2014-04-23 NOTE — Patient Instructions (Signed)
Please continue all other medications as before, and refills have been done if requested.  Please have the pharmacy call with any other refills you may need.  Please continue your efforts at being more active, low cholesterol diet, and weight control.  You are otherwise up to date with prevention measures today.  Please keep your appointments with your specialists as you may have planned  No lab work is needed today

## 2014-04-23 NOTE — Telephone Encounter (Signed)
Left msg with wife, returning his phone call.

## 2014-04-27 ENCOUNTER — Telehealth: Payer: Self-pay | Admitting: Internal Medicine

## 2014-04-27 NOTE — Telephone Encounter (Signed)
emmi mailed  °

## 2014-04-30 ENCOUNTER — Encounter: Payer: Self-pay | Admitting: *Deleted

## 2014-05-01 ENCOUNTER — Ambulatory Visit (INDEPENDENT_AMBULATORY_CARE_PROVIDER_SITE_OTHER): Payer: Medicare Other | Admitting: Pharmacist Clinician (PhC)/ Clinical Pharmacy Specialist

## 2014-05-01 DIAGNOSIS — I483 Typical atrial flutter: Secondary | ICD-10-CM | POA: Diagnosis not present

## 2014-05-01 DIAGNOSIS — Z7901 Long term (current) use of anticoagulants: Secondary | ICD-10-CM | POA: Diagnosis not present

## 2014-05-01 LAB — POCT INR: INR: 2.1

## 2014-05-05 ENCOUNTER — Other Ambulatory Visit: Payer: Self-pay | Admitting: *Deleted

## 2014-05-05 NOTE — Patient Outreach (Signed)
[facility]) Care Management   05/05/2014  [name] 1945/04/13 [id number]  [name] is an 69 y.o. male  Subjective: [name] has been followed off and on by [facility] for various issues over the last 2 years including diabetes, falls, cardiovascular issues, swallowing and weight loss issues, and most recently in follow up after back surgery.   Objective:    BP 110/64 mmHg  Pulse 66  SpO2 94%   Review of Systems  Constitutional: Negative.   HENT: Negative.   Eyes: Negative.   Respiratory: Negative.   Cardiovascular: Negative.   Gastrointestinal: Negative.   Genitourinary: Negative.   Musculoskeletal: Negative.   Skin: Negative.   Neurological: Negative.   Psychiatric/Behavioral: Negative.     Physical Exam  Constitutional: He is oriented to person, place, and time. Vital signs are normal. He appears well-developed and well-nourished. He is active.  Cardiovascular: Normal rate and regular rhythm.   Respiratory: Effort normal and breath sounds normal.  GI: Soft. Bowel sounds are normal.  Neurological: He is alert and oriented to person, place, and time.  Skin: Skin is warm, dry and intact.  Psychiatric: He has a normal mood and affect. His speech is normal and behavior is normal. Judgment and thought content normal. Cognition and memory are normal.    Current Medications:   Current Outpatient Prescriptions  Medication Sig Dispense Refill  . allopurinol (ZYLOPRIM) 300 MG tablet take 1 tablet by mouth once daily (Patient taking differently: Take 300 mg by mouth daily. ) 90 tablet 3  . atorvastatin (LIPITOR) 80 MG tablet Take 1 tablet (80 mg total) by mouth daily. 90 tablet 3  . carvedilol (COREG) 25 MG tablet Take 1 tablet (25 mg total) by mouth 2 (two) times daily. 180 tablet 3  . Cholecalciferol (VITAMIN D) 1000 UNITS capsule Take 1,000 Units by mouth daily.     . fluticasone (FLONASE) 50 MCG/ACT nasal spray Place 2 sprays into  both nostrils 2 (two) times daily. 48 g 3  . furosemide (LASIX) 40 MG tablet Take 1 tablet (40 mg total) by mouth daily. 90 tablet 3  . gabapentin (NEURONTIN) 300 MG capsule Take 2 capsules (600 mg total) by mouth 3 (three) times daily. 540 capsule 3  . glipiZIDE (GLUCOTROL XL) 10 MG 24 hr tablet Take 1 tablet (10 mg total) by mouth daily with breakfast. 90 tablet 3  . glucose blood (ONE TOUCH ULTRA TEST) test strip Use to check blood sugar twice a day Dx E11.40 (Patient taking differently: 1 each by Other route See admin instructions. Check blood sugar 3 times daily) 300 each 3  . ibuprofen (ADVIL,MOTRIN) 200 MG tablet Take 800 mg by mouth daily as needed for mild pain or moderate pain.     Marland Kitchen lisinopril (PRINIVIL,ZESTRIL) 2.5 MG tablet Take 1 tablet (2.5 mg total) by mouth daily. 90 tablet 3  . metFORMIN (GLUCOPHAGE) 500 MG tablet Take 2 tablets (1,000 mg total) by mouth 2 (two) times daily. 360 tablet 3  . Multiple Vitamin (MULTIVITAMIN WITH MINERALS) TABS tablet Take 1 tablet by mouth every morning.    Marland Kitchen oxyCODONE-acetaminophen (PERCOCET/ROXICET) 5-325 MG per tablet Take 1-2 tablets by mouth every 4 (four) hours as needed for moderate pain. 80 tablet 0  . potassium chloride SA (K-DUR,KLOR-CON) 20 MEQ tablet Take 1 tablet (20 mEq total) by mouth every evening. 90 tablet 3  . sitaGLIPtin (JANUVIA) 100 MG tablet Take 1 tablet (100 mg total) by mouth daily. [place]  tablet 3  . tamsulosin (FLOMAX) 0.4 MG CAPS capsule Take 1 capsule (0.4 mg total) by mouth every 12 (twelve) hours. (Patient taking differently: Take 0.4 mg by mouth daily. ) 90 capsule 3  . tiZANidine (ZANAFLEX) 4 MG tablet Take 1 tablet (4 mg total) by mouth every 6 (six) hours as needed for muscle spasms. 180 tablet 3  . warfarin (COUMADIN) 5 MG tablet Take 1 tablet (5 mg total) by mouth daily. (Patient taking differently: Take 5 mg by mouth daily. Take 2.5 mg by mouth on Sunday, Tuesday, Thursday, Friday and Saturday. Take 75m by mouth Monday  and Wednesday.) 90 tablet 3  . predniSONE (DELTASONE) 50 MG tablet Take 1 tablet (50 mg total) by mouth daily. (Patient not taking: Reported on 05/05/2014) 5 tablet 0  . traMADol (ULTRAM) 50 MG tablet Take 1 tablet (50 mg total) by mouth every 12 (twelve) hours as needed for moderate pain or severe pain. (Patient not taking: Reported on 05/05/2014) 60 tablet 2   No current facility-administered medications for this visit.   Facility-Administered Medications Ordered in Other Visits  Medication Dose Route Frequency Provider Last Rate Last Dose  . enoxaparin (LOVENOX) injection 100 mg  1 mg/kg Subcutaneous Q12H HEarnie Larsson MD        Functional Status:   In your present state of health, do you have any difficulty performing the following activities: 03/23/2014 03/23/2014  Is the patient deaf or have difficulty hearing? Y -  Hearing N -  Vision N -  Difficulty concentrating or making decisions Y -  Walking or climbing stairs? N -  Doing errands, shopping? - N    Fall/Depression Screening:    PHQ 2/9 Scores 05/05/2014 02/24/2014 02/18/2013 08/15/2012  PHQ - 2 Score 0 1 0 0    Assessment:    Post surgical care needs - [name] back surgery on 03/23/14; he is participating in physical therapy and feels he is progressing well; he says he only has a pain score of 2 1/2 to 3 on most days and only takes narcotic analgesic at night so he can get comfortable enough to sleep; he has been to his first post op appointment and is scheduled for his next; [name] very pleased with the outcome of this surgery.   Chronic Health condition (DM) - [name] had no episodes of hypoglycemia; he is taking medications as prescribed; I urged [name] continue to eat three times daily even if he doesn't feel very hungry and even if he can only eat a few bites; [name] had early satiety off and on for the last 3-4 months and has discussed this with [name]  Mr. PPeppermanadmittedly struggles  with adherence to prescribed carb modified diet. I have offered to ask for a referral to [facility] Thus far, [name] has declined, saying he wants to "get over the surgery".   Chronic Health Condition (HTN) - monitoring bp at home; no episodes hypertension or hypotension; taking medications as prescribed   [place]       [place]  post [place] care needs   Care Plan for Problem One  Active   [facility]N Long Term Goal (31-90 days)  patient will receive all post surgical care and assessments over the next 60 days   [facility]Long Term  Goal Start Date  04/01/14   [facility] CM Short Term Goal #1 (0-30 days)  patient will peform home exercises as recommended by home health PT on days therapist does not visit as evidenced by check mark on calendar over the next 30 days   [facility] CM Short Term Goal #1 Start Date  04/01/14   [facility] CM Short Term Goal #1 Met Date  05/05/14   [facility] CM Short Term Goal #2 (0-30 days)  patient will attend upcoming post-surgical follow up appointments as scheduled over the next 30 days   TH[facility] CM Short Term Goal #2 Start Date  04/01/14   [facility] CM Short Term Goal #2 Met Date  05/05/14   Care Plan Problem Two  early satiety   Care Plan for Problem Two  Active   THN Long Term Goal (31-90) days  patient will be evaluated for complaints of early satiety in the next 60 days as evidenced by patient report and provider documentation   THN Long Term Goal Start Date  04/02/14   [facility] CM Short Term Goal #1 (0-30 days)  patient will discuss symptoms with provider at next appointment  on 04/10/14 as evidenced by provider documentation   [facility] CM Short Term Goal #1 Start Date  04/02/14   [facility] CM Short Term Goal #1 Met Date   04/10/14   Care Plan for Problem Three  Active   THN Long Term Goal (31-90) days  Home Safety/Fall Risk   [facility]N CM Short Term Goal #1 (0-30  days)  patient will not have ED visit or hospitalization due to falls in the 31 day period after hospital discharge   [facility] CM Short Term Goal #1 Start Date  04/02/14   [facility] CM Short Term Goal #1 Met Date  05/05/14       [place] [facility] Management  [phone]

## 2014-05-05 NOTE — Patient Instructions (Signed)

## 2014-05-15 ENCOUNTER — Ambulatory Visit (INDEPENDENT_AMBULATORY_CARE_PROVIDER_SITE_OTHER): Payer: Medicare Other | Admitting: *Deleted

## 2014-05-15 DIAGNOSIS — I442 Atrioventricular block, complete: Secondary | ICD-10-CM

## 2014-05-15 LAB — MDC_IDC_ENUM_SESS_TYPE_INCLINIC
Battery Impedance: 346 Ohm
Battery Remaining Longevity: 91 mo
Battery Voltage: 2.77 V
Brady Statistic AP VP Percent: 17 %
Brady Statistic AS VS Percent: 51 %
Date Time Interrogation Session: 20160415112141
Lead Channel Impedance Value: 490 Ohm
Lead Channel Pacing Threshold Pulse Width: 0.4 ms
Lead Channel Sensing Intrinsic Amplitude: 4 mV
Lead Channel Setting Pacing Amplitude: 2 V
Lead Channel Setting Pacing Amplitude: 2.5 V
Lead Channel Setting Pacing Pulse Width: 0.4 ms
Lead Channel Setting Sensing Sensitivity: 2.8 mV
MDC IDC MSMT LEADCHNL RV IMPEDANCE VALUE: 470 Ohm
MDC IDC MSMT LEADCHNL RV PACING THRESHOLD AMPLITUDE: 0.75 V
MDC IDC MSMT LEADCHNL RV SENSING INTR AMPL: 5.6 mV
MDC IDC STAT BRADY AP VS PERCENT: 0 %
MDC IDC STAT BRADY AS VP PERCENT: 32 %

## 2014-05-15 NOTE — Progress Notes (Signed)
Normal pacer check. Pt in AF 100% of time since 03-23-14. + Warfarin. No changes made--consider changing mode to VVIR/DDIR at next visit due to chronic AF. Pt does not wish to use Carelink monitor due to charges q 3 months.  ROV in 6 months with GT/RDS

## 2014-05-24 NOTE — Progress Notes (Signed)
HPI: FU coronary artery disease status post coronary artery bypass graft, atrial flutter and previous pacemaker placement. Last cardiac catheterization in May of 2010 showed a normal left main, a 50-70% circumflex, a totally occluded LAD, and a focal 70% right coronary artery. The saphenous vein graft to the circumflex was occluded. The LIMA to the LAD was patent. Ejection fraction was 60%. Carotid Dopplers in October of 2011 were normal. Repeat echocardiogram in August 2015 showed EF 55-60, mild LAE. At previous OV pt noted to be in atrial flutter; seen by GT and rate control and anticoagulation recommended. Nuclear study 2/16 showed EF 52, fixed apical defect; no ischemia. Since last seen, the patient denies any dyspnea on exertion, orthopnea, PND, pedal edema, palpitations, syncope or chest pain.   Current Outpatient Prescriptions  Medication Sig Dispense Refill  . allopurinol (ZYLOPRIM) 300 MG tablet take 1 tablet by mouth once daily (Patient taking differently: Take 300 mg by mouth daily. ) 90 tablet 3  . atorvastatin (LIPITOR) 80 MG tablet Take 1 tablet (80 mg total) by mouth daily. 90 tablet 3  . carvedilol (COREG) 25 MG tablet Take 1 tablet (25 mg total) by mouth 2 (two) times daily. 180 tablet 3  . Cholecalciferol (VITAMIN D) 1000 UNITS capsule Take 1,000 Units by mouth daily.     . fluticasone (FLONASE) 50 MCG/ACT nasal spray Place 2 sprays into both nostrils 2 (two) times daily. 48 g 3  . furosemide (LASIX) 40 MG tablet Take 1 tablet (40 mg total) by mouth daily. 90 tablet 3  . gabapentin (NEURONTIN) 300 MG capsule Take 2 capsules (600 mg total) by mouth 3 (three) times daily. 540 capsule 3  . glipiZIDE (GLUCOTROL XL) 10 MG 24 hr tablet Take 1 tablet (10 mg total) by mouth daily with breakfast. 90 tablet 3  . glucose blood (ONE TOUCH ULTRA TEST) test strip Use to check blood sugar twice a day Dx E11.40 (Patient taking differently: 1 each by Other route See admin instructions. Check  blood sugar 3 times daily) 300 each 3  . ibuprofen (ADVIL,MOTRIN) 200 MG tablet Take 800 mg by mouth daily as needed for mild pain or moderate pain.     Marland Kitchen lisinopril (PRINIVIL,ZESTRIL) 2.5 MG tablet Take 1 tablet (2.5 mg total) by mouth daily. 90 tablet 3  . metFORMIN (GLUCOPHAGE) 500 MG tablet Take 2 tablets (1,000 mg total) by mouth 2 (two) times daily. 360 tablet 3  . Multiple Vitamin (MULTIVITAMIN WITH MINERALS) TABS tablet Take 1 tablet by mouth every morning.    Marland Kitchen oxyCODONE-acetaminophen (PERCOCET/ROXICET) 5-325 MG per tablet Take 1-2 tablets by mouth every 4 (four) hours as needed for moderate pain. 80 tablet 0  . potassium chloride SA (K-DUR,KLOR-CON) 20 MEQ tablet Take 1 tablet (20 mEq total) by mouth every evening. 90 tablet 3  . predniSONE (DELTASONE) 50 MG tablet Take 1 tablet (50 mg total) by mouth daily. 5 tablet 0  . sitaGLIPtin (JANUVIA) 100 MG tablet Take 1 tablet (100 mg total) by mouth daily. 90 tablet 3  . tamsulosin (FLOMAX) 0.4 MG CAPS capsule Take 1 capsule (0.4 mg total) by mouth every 12 (twelve) hours. (Patient taking differently: Take 0.4 mg by mouth daily. ) 90 capsule 3  . tiZANidine (ZANAFLEX) 4 MG tablet Take 1 tablet (4 mg total) by mouth every 6 (six) hours as needed for muscle spasms. 180 tablet 3  . traMADol (ULTRAM) 50 MG tablet Take 1 tablet (50 mg total) by mouth every  12 (twelve) hours as needed for moderate pain or severe pain. 60 tablet 2  . warfarin (COUMADIN) 5 MG tablet Take 1 tablet (5 mg total) by mouth daily. (Patient taking differently: Take 5 mg by mouth daily. Take 2.5 mg by mouth on Sunday, Tuesday, Thursday, Friday and Saturday. Take 5mg by mouth Monday and Wednesday.) 90 tablet 3   No current facility-administered medications for this visit.   Facility-Administered Medications Ordered in Other Visits  Medication Dose Route Frequency Provider Last Rate Last Dose  . enoxaparin (LOVENOX) injection 100 mg  1 mg/kg Subcutaneous Q12H Henry Pool, MD          Past Medical History  Diagnosis Date  . 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 HYPERLIPIDEMIA 08/29/2006    takes Atorvastatin daily  . GOUT 04/22/2007    takes Allopurinol daily  . DEPRESSION 08/29/2006  . 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 DIASTOLIC HEART FAILURE, CHRONIC 06/09/2008  . BENIGN PROSTATIC HYPERTROPHY 08/29/2006    takes Flomax daily  . LUMBAR RADICULOPATHY, RIGHT 06/10/2007  . INSOMNIA-SLEEP DISORDER-UNSPEC 10/23/2007  . PACEMAKER, PERMANENT 03/04/2008    pt denies this date  . Left lumbar radiculopathy 05/30/2010  . Sleep apnea     "if I lay flat I quit breathing; HOB up I\'m fine"  . Arthritis     "lower back; going back down both my sciatic nerves"  . Aortic root dilatation 02/02/2011  . HYPERTENSION 08/29/2006    takes Lisinopril daily  . Myocardial infarction 12/27/10    "I\'ve had several MIs"  . Muscle spasm     takes Zanaflex daily  . CHF (congestive heart failure)     takes Lasix daily  . Shortness of breath dyspnea     "all my life" with exertion  . History of migraine     30 yrs ago  . DIABETES MELLITUS, TYPE II 08/29/2006    takes Metformin,Januvia,and Glipizide  daily  . CORONARY ARTERY DISEASE 08/29/2006    takes Coumadin daily  . 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 Peripheral neuropathy     takes Gabapentin daily  . Chronic back pain     HNP   . Cataracts, bilateral     immature  . Presence of permanent cardiac pacemaker     Past Surgical History  Procedure Laterality Date  . S/p left arm surgury after work accident  1991    "2000# steel fell on it"  . S/p right hand surgury for foreign object  1970's    "piece of wood went in my hand; had to get that out"  . Insert / replace / remove pacemaker  ~ 2004    initial pacemaker placement  . Insert / replace / remove pacemaker  10/2009    generator change  . Colonoscopy    . Esophagogastroduodenoscopy N/A 12/01/2013    Procedure: ESOPHAGOGASTRODUODENOSCOPY (EGD);  Surgeon:  Lafayette Dragon, MD;  Location: [facility];  Service: Endoscopy;  Laterality: N/A;  . Coronary artery bypass graft  1992    CABG X 2  . Coronary angioplasty with stent placement  12/27/10    "I've had a total of 9 cardiac stents put in"  . Lumbar laminectomy/decompression microdiscectomy Right 03/23/2014    Procedure: LAMINECTOMY AND FORAMINOTOMY RIGHT LUMBAR THREE-FOUR,LUMBAR FOUR-FIVE, LUMBAR FIVE-SACRAL ONE;  Surgeon: Charlie Pitter, MD;  Location: [facility];  Service: Neurosurgery;  Laterality: Right;  right    History   Social History  . Marital Status: Married  Spouse Name: N/A  . Number of Children: 2  . Years of Education: N/A   Occupational History  . prior work Engineer, building services   . disabled since 2004    Social History Main Topics  . Smoking status: Former Smoker -- 3.00 packs/day for 9 years    Types: Cigarettes  . Smokeless tobacco: Former Systems developer     Comment: quit smoking 63yrs ago  . Alcohol Use: No  . Drug Use: No     Comment: "used pouches of tobacco for a long time; quit those 12/09/1970"  . Sexual Activity: Yes   Other Topics Concern  . Not on file   Social History Narrative    ROS: no fevers or chills, productive cough, hemoptysis, dysphasia, odynophagia, melena, hematochezia, dysuria, hematuria, rash, seizure activity, orthopnea, PND, pedal edema, claudication. Remaining systems are negative.  Physical Exam: Well-developed well-nourished in no acute distress.  Skin is warm and dry.  HEENT is normal.  Neck is supple.  Chest is clear to auscultation with normal expansion.  Cardiovascular exam is regular rate and rhythm.  Abdominal exam nontender or distended. No masses palpated. Extremities show no edema. neuro grossly intact  ECG ventricular pacing with underlying atrial flutter.

## 2014-05-27 ENCOUNTER — Encounter: Payer: Self-pay | Admitting: Cardiology

## 2014-05-27 ENCOUNTER — Ambulatory Visit (INDEPENDENT_AMBULATORY_CARE_PROVIDER_SITE_OTHER): Payer: Medicare Other | Admitting: Cardiology

## 2014-05-27 ENCOUNTER — Ambulatory Visit (INDEPENDENT_AMBULATORY_CARE_PROVIDER_SITE_OTHER): Payer: Medicare Other | Admitting: Pharmacist Clinician (PhC)/ Clinical Pharmacy Specialist

## 2014-05-27 ENCOUNTER — Encounter: Payer: Self-pay | Admitting: *Deleted

## 2014-05-27 VITALS — BP 120/78 | HR 88 | Ht 71.0 in | Wt 219.0 lb

## 2014-05-27 DIAGNOSIS — Z950 Presence of cardiac pacemaker: Secondary | ICD-10-CM

## 2014-05-27 DIAGNOSIS — E785 Hyperlipidemia, unspecified: Secondary | ICD-10-CM

## 2014-05-27 DIAGNOSIS — I10 Essential (primary) hypertension: Secondary | ICD-10-CM

## 2014-05-27 DIAGNOSIS — R0989 Other specified symptoms and signs involving the circulatory and respiratory systems: Secondary | ICD-10-CM | POA: Diagnosis not present

## 2014-05-27 DIAGNOSIS — I25710 Atherosclerosis of autologous vein coronary artery bypass graft(s) with unstable angina pectoris: Secondary | ICD-10-CM

## 2014-05-27 DIAGNOSIS — I483 Typical atrial flutter: Secondary | ICD-10-CM

## 2014-05-27 DIAGNOSIS — Z7901 Long term (current) use of anticoagulants: Secondary | ICD-10-CM

## 2014-05-27 DIAGNOSIS — I5032 Chronic diastolic (congestive) heart failure: Secondary | ICD-10-CM | POA: Diagnosis not present

## 2014-05-27 LAB — POCT INR: INR: 2.1

## 2014-05-27 NOTE — Assessment & Plan Note (Signed)
Blood pressure controlled. Continue present medications. 

## 2014-05-27 NOTE — Patient Instructions (Addendum)
Your physician wants you to follow-up in: [place] will receive a reminder letter in the mail two months in advance. If you don't receive a letter, please call our office to schedule the follow-up appointment.   Your physician has requested that you have an abdominal aorta duplex. During this test, an ultrasound is used to evaluate the aorta. Allow 30 minutes for this exam. Do not eat after midnight the day before and avoid carbonated beverages

## 2014-05-27 NOTE — Assessment & Plan Note (Signed)
Patient remains in atrial flutter. He is asymptomatic. Continue carvedilol for rate control. Embolic risk factors include age greater than 56, coronary artery disease, history of congestive heart failure, hypertension and diabetes mellitus. Continue coumadin. Dr. Lovena Le reviewed and did not recommend atrial flutter ablation.

## 2014-05-27 NOTE — Assessment & Plan Note (Signed)
Continue statin. 

## 2014-05-27 NOTE — Assessment & Plan Note (Signed)
Scheduled for sound to exclude abdominal aortic aneurysm

## 2014-05-27 NOTE — Assessment & Plan Note (Signed)
Continue present dose of Lasix. 

## 2014-05-27 NOTE — Assessment & Plan Note (Signed)
Followed by electrophysiology. 

## 2014-05-28 ENCOUNTER — Encounter: Payer: Self-pay | Admitting: Internal Medicine

## 2014-06-18 ENCOUNTER — Other Ambulatory Visit: Payer: Self-pay | Admitting: *Deleted

## 2014-06-18 VITALS — BP 102/66 | HR 67 | Ht 71.0 in | Wt 220.0 lb

## 2014-06-18 DIAGNOSIS — E119 Type 2 diabetes mellitus without complications: Secondary | ICD-10-CM

## 2014-06-18 NOTE — Patient Outreach (Signed)
[facility]) Care Management   06/18/2014  TYCEN DOCKTER 1945-12-14 [id number]  [name] is an 69 y.o. male  Subjective: 'I'm cut free from the surgeon. I guess I have to get back to working on my weight and my sugar now."  Goals    . "I want to get a different monitor so its easier to keep up."     Patient currently has One Touch Mini; no memory function; requests "regular One Touch"; RNCM requested from primary care office    . Weight < 200 lb (90.719 kg)     Weight is up almost 20 pounds since before surgery; patient starting carb counting and exercise regimen this week        Objective:  [name] is a 69 year old gentleman who lives in [place] with his wife. [name] has DM Type II, CAD, Hyperlipidemia, HTN, and history of CHF. He has been followed by [facility] for over 2 years because of his multiple medical problems, surgeries, and falls. Most recently, [name] underwent back surgery and has progressed well post-operatively.   I am now working with him on Diabetes management  BP 102/66 mmHg  Pulse 67  Ht 1.803 m (5\' 11" )  Wt 220 lb (99.791 kg)  BMI 30.70 kg/m2  SpO2 97%  Review of Systems  Constitutional: Negative.   HENT: Negative.   Eyes: Negative.   Respiratory:       Reports "sneezing spells that cause me to lose my breath"  Cardiovascular: Positive for leg swelling.       Trace edema right ankle/lower leg  Gastrointestinal: Negative.   Genitourinary: Negative.   Musculoskeletal: Positive for joint pain. Negative for falls.  Skin: Positive for rash.       Flat, red rash noted; per patient, present x 1 week; mild edema of right leg (surgical side)  Neurological: Negative.   Endo/Heme/Allergies: Negative.   Psychiatric/Behavioral: Negative.     Physical Exam  Constitutional: He is oriented to person, place, and time. Vital signs are normal. He appears well-developed and well-nourished. He is active.   Cardiovascular: Normal rate, intact distal pulses and normal pulses.   Respiratory: Effort normal and breath sounds normal.  GI: Soft. Bowel sounds are normal.  Musculoskeletal: He exhibits edema.  Trace edema RLE  Neurological: He is alert and oriented to person, place, and time.  Skin: Skin is warm, dry and intact.  Psychiatric: He has a normal mood and affect. His speech is normal and behavior is normal. Judgment and thought content normal. Cognition and memory are normal.    Current Medications:   Current Outpatient Prescriptions  Medication Sig Dispense Refill  . allopurinol (ZYLOPRIM) 300 MG tablet take 1 tablet by mouth once daily (Patient taking differently: Take 300 mg by mouth daily. ) 90 tablet 3  . atorvastatin (LIPITOR) 80 MG tablet Take 1 tablet (80 mg total) by mouth daily. 90 tablet 3  . carvedilol (COREG) 25 MG tablet Take 1 tablet (25 mg total) by mouth 2 (two) times daily. 180 tablet 3  . Cholecalciferol (VITAMIN D) 1000 UNITS capsule Take 1,000 Units by mouth daily.     . fluticasone (FLONASE) 50 MCG/ACT nasal spray Place 2 sprays into both nostrils 2 (two) times daily. 48 g 3  . furosemide (LASIX) 40 MG tablet Take 1 tablet (40 mg total) by mouth daily. 90 tablet 3  . gabapentin (NEURONTIN) 300 MG capsule Take 2 capsules (600 mg total)  by mouth 3 (three) times daily. 540 capsule 3  . glipiZIDE (GLUCOTROL XL) 10 MG 24 hr tablet Take 1 tablet (10 mg total) by mouth daily with breakfast. 90 tablet 3  . glucose blood (ONE TOUCH ULTRA TEST) test strip Use to check blood sugar twice a day Dx E11.40 (Patient taking differently: 1 each by Other route See admin instructions. Check blood sugar 3 times daily) 300 each 3  . ibuprofen (ADVIL,MOTRIN) 200 MG tablet Take 800 mg by mouth daily as needed for mild pain or moderate pain.     Marland Kitchen lisinopril (PRINIVIL,ZESTRIL) 2.5 MG tablet Take 1 tablet (2.5 mg total) by mouth daily. 90 tablet 3  . metFORMIN (GLUCOPHAGE) 500 MG tablet Take 2  tablets (1,000 mg total) by mouth 2 (two) times daily. 360 tablet 3  . Multiple Vitamin (MULTIVITAMIN WITH MINERALS) TABS tablet Take 1 tablet by mouth every morning.    Marland Kitchen oxyCODONE-acetaminophen (PERCOCET/ROXICET) 5-325 MG per tablet Take 1-2 tablets by mouth every 4 (four) hours as needed for moderate pain. 80 tablet 0  . potassium chloride SA (K-DUR,KLOR-CON) 20 MEQ tablet Take 1 tablet (20 mEq total) by mouth every evening. 90 tablet 3  . sitaGLIPtin (JANUVIA) 100 MG tablet Take 1 tablet (100 mg total) by mouth daily. 90 tablet 3  . tamsulosin (FLOMAX) 0.4 MG CAPS capsule Take 1 capsule (0.4 mg total) by mouth every 12 (twelve) hours. (Patient taking differently: Take 0.4 mg by mouth daily. ) 90 capsule 3  . tiZANidine (ZANAFLEX) 4 MG tablet Take 1 tablet (4 mg total) by mouth every 6 (six) hours as needed for muscle spasms. 180 tablet 3  . traMADol (ULTRAM) 50 MG tablet Take 1 tablet (50 mg total) by mouth every 12 (twelve) hours as needed for moderate pain or severe pain. 60 tablet 2  . warfarin (COUMADIN) 5 MG tablet Take 1 tablet (5 mg total) by mouth daily. (Patient taking differently: Take 5 mg by mouth daily. Take 2.5 mg by mouth on Sunday, Tuesday, Thursday, Friday and Saturday. Take 5mg  by mouth Monday and Wednesday.) 90 tablet 3  . predniSONE (DELTASONE) 50 MG tablet Take 1 tablet (50 mg total) by mouth daily. (Patient not taking: Reported on 06/18/2014) 5 tablet 0      Assessment:    Chronic Health Condition (DM) - cbg's > 200 at least 15 times in last month; not exercising regularly; taking medications as prescribed; starting exercise regimen, dietary journaling this month   Plan:    Routine Home Visit next month > Diabetes Management.   [facility] CM Care Plan Problem One        Patient Outreach from 06/18/2014 in [place] Problem One  Diabetes Management (cbg's > 200 15 out of 80 checks in last19 days)   Care Plan for Problem One  Active   [facility] Long Term  Goal (31-90 days)  patient will note cbg > 200 no more than 10 times in next 30 days as evidenced by cbg log in [facility] notebook/calendar   [facility] Long Term Goal Start Date  06/18/14   Interventions for Problem One Long Term Goal  utilizing teachback method, reviewed plan for management of CBG's including goal as stated   [facility] CM Short Term Goal #1 (0-30 days)  patient will walk 10-15" minutes 4 days each week over the next 30 days and dcoument in blue [facility] Calendar   [facility] CM Short Term Goal #1 Start Date  06/18/14  Interventions for Short Term Goal #1  utilizing teachbacks method, discussed rationale for exercise as method of diabetes management   [facility] CM Short Term Goal #2 (0-30 days)  over the next 30 days patient will record carbohydrate intake daily in blue [facility] Care Management notebook at meals as method of dietary review /evaluation   [facility] CM Short Term Goal #2 Start Date  06/18/14   Interventions for Short Term Goal #2  utilizing teachback method, reviewed prescribed carb modified diet guidelines and value of self monitoring as effective method of dietary adherence      [place] [facility]  [phone]

## 2014-06-23 ENCOUNTER — Telehealth: Payer: Self-pay

## 2014-06-23 MED ORDER — ONETOUCH ULTRA SYSTEM W/DEVICE KIT: 1.0000 | PACK | Freq: Once | Status: DC

## 2014-06-23 NOTE — Telephone Encounter (Signed)
Okay to Rx One Touch glucometer?

## 2014-06-23 NOTE — Telephone Encounter (Signed)
-----   Message from Clerance Lav, RN sent at 06/22/2014 10:12 AM EDT ----- [address],  I hope your Monday is off to a good start.  I am the [facility] Nurse Case Manager who has been following Mr. Copley for 2 years now.   I saw Mr. Kent at home on Thursday. He is using a one touch mini cbg meter and doesn't like it b/c he finds it difficult to manipulate and because it does not record averages. He is wondering if your office has any meters for which his insurance would pay for additional strips/supplies or if [name] would be willing to send a prescription in for a One Touch (standard size).   Happy to help if I can. If you have something there at the office, I may be able to stop by and pick it up for him then take it out the next time I'm headed his way (mid-next week).   Thank you, [place] [facility]  [phone]

## 2014-06-23 NOTE — Telephone Encounter (Signed)
Sure, ok for rx, thanks

## 2014-06-24 ENCOUNTER — Ambulatory Visit (HOSPITAL_COMMUNITY)
Admission: RE | Admit: 2014-06-24 | Discharge: 2014-06-24 | Disposition: A | Discharge status: Home / Self Care | Payer: Medicare Other | Source: Ambulatory Visit | Attending: Cardiovascular Disease | Admitting: Cardiovascular Disease

## 2014-06-24 ENCOUNTER — Ambulatory Visit (INDEPENDENT_AMBULATORY_CARE_PROVIDER_SITE_OTHER): Payer: Medicare Other | Admitting: Pharmacist Clinician (PhC)/ Clinical Pharmacy Specialist

## 2014-06-24 DIAGNOSIS — R0989 Other specified symptoms and signs involving the circulatory and respiratory systems: Secondary | ICD-10-CM | POA: Diagnosis not present

## 2014-06-24 DIAGNOSIS — I483 Typical atrial flutter: Secondary | ICD-10-CM

## 2014-06-24 DIAGNOSIS — Z7901 Long term (current) use of anticoagulants: Secondary | ICD-10-CM

## 2014-06-24 LAB — POCT INR: INR: 2.3

## 2014-07-15 ENCOUNTER — Encounter: Payer: Self-pay | Admitting: Internal Medicine

## 2014-07-15 ENCOUNTER — Other Ambulatory Visit: Payer: Self-pay | Admitting: *Deleted

## 2014-07-15 ENCOUNTER — Ambulatory Visit (INDEPENDENT_AMBULATORY_CARE_PROVIDER_SITE_OTHER)
Admission: RE | Admit: 2014-07-15 | Discharge: 2014-07-15 | Disposition: A | Discharge status: Home / Self Care | Payer: Medicare Other | Source: Ambulatory Visit | Attending: Internal Medicine | Admitting: Internal Medicine

## 2014-07-15 ENCOUNTER — Ambulatory Visit (INDEPENDENT_AMBULATORY_CARE_PROVIDER_SITE_OTHER): Payer: Medicare Other | Admitting: Internal Medicine

## 2014-07-15 VITALS — BP 138/72 | HR 83 | Temp 97.8°F | Ht 70.0 in | Wt 228.0 lb

## 2014-07-15 DIAGNOSIS — M79605 Pain in left leg: Secondary | ICD-10-CM | POA: Diagnosis not present

## 2014-07-15 DIAGNOSIS — M7989 Other specified soft tissue disorders: Secondary | ICD-10-CM | POA: Diagnosis not present

## 2014-07-15 DIAGNOSIS — I10 Essential (primary) hypertension: Secondary | ICD-10-CM

## 2014-07-15 DIAGNOSIS — I5032 Chronic diastolic (congestive) heart failure: Secondary | ICD-10-CM | POA: Diagnosis not present

## 2014-07-15 MED ORDER — FUROSEMIDE 80 MG PO TABS: 80.0000 mg | ORAL_TABLET | Freq: Every day | ORAL | Status: DC

## 2014-07-15 MED ORDER — FUROSEMIDE 80 MG PO TABS: 80.0000 mg | ORAL_TABLET | Freq: Two times a day (BID) | ORAL | Status: DC

## 2014-07-15 NOTE — Progress Notes (Signed)
Pre visit review using our clinic review tool, if applicable. No additional management support is needed unless otherwise documented below in the visit note. 

## 2014-07-15 NOTE — Assessment & Plan Note (Signed)
stable overall by history and exam, recent data reviewed with pt, and pt to continue medical treatment as before,  to f/u any worsening symptoms or concerns BP Readings from Last 3 Encounters:  07/15/14 138/72  06/18/14 102/66  05/27/14 120/78

## 2014-07-15 NOTE — Assessment & Plan Note (Signed)
With acute flare, for increaesd lasix to 80 bid, check wts daily, and f/u 1 wk, cont K and all other meds, f/u card as planned for now, hold on further tests

## 2014-07-15 NOTE — Patient Instructions (Signed)
OK to increase the lasix to 80 mg twice per day  Please check you weight daily in the am, and bring with you to the next visit  Please do not drink extra fluids, as this makes swelling worse  Please continue all other medications as before, and refills have been done if requested.  Please have the pharmacy call with any other refills you may need.  Please keep your appointments with your specialists as you may have planned  Please go to the XRAY Department in the Basement (go straight as you get off the elevator) for the x-ray testing  You will be contacted by phone if any changes need to be made immediately.  Otherwise, you will receive a letter about your results with an explanation, but please check with MyChart first.  Please return in 1 week, or sooner if needed

## 2014-07-15 NOTE — Progress Notes (Signed)
Subjective:    Patient ID: [name], male    DOB: [birth year], 69 y.o.   MRN: [id number]  HPI  hee with acute onset RLE swelling and achy pain, better with advil, for 1 mo, overall mild, but swelling seems progress worse, out of proportion to S/p RLE vein harvesting..  Wt up to 228. Has been taking lasix 40 bid for the pastt 3 wks (prior only 40 qd) Wt Readings from Last 3 Encounters:  07/15/14 228 lb (103.42 kg)  06/18/14 220 lb (99.791 kg)  05/27/14 219 lb (99.338 kg)  No current f/u appt with card.  + hx of chronic diast chf.  Pt denies chest pain, increased sob or doe, wheezing, orthopnea, PND, increased LE swelling, palpitations, dizziness or syncope. Pt denies new neurological symptoms such as new headache, or facial or extremity weakness or numbness    Does also have unsual mid left LE pain , sharp, severe, for 1 mo, intermittent with flares for no reason, sometimes better sometimes not to walk, does seem better to lie down, also swelling overall goes down with leg elevation.  Has some left sciatica but this pain is diffferent.  Pt denies fever, wt loss, night sweats, loss of appetite, or other constitutional symptoms  Has some brawny skin changes bilat. .  On coumadin  Lab Results  Component Value Date   INR 2.3 06/24/2014   INR 2.1 05/27/2014   INR 2.1 05/01/2014  last venous doppler years ago.  Had been on eliquis but not affordable, now on coumadin   Past Medical History  Diagnosis Date  . 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 HYPERLIPIDEMIA 08/29/2006    takes Atorvastatin daily  . GOUT 04/22/2007    takes Allopurinol daily  . DEPRESSION 08/29/2006  . 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 DIASTOLIC HEART FAILURE, CHRONIC 06/09/2008  . BENIGN PROSTATIC HYPERTROPHY 08/29/2006    takes Flomax daily  . LUMBAR RADICULOPATHY, RIGHT 06/10/2007  . INSOMNIA-SLEEP DISORDER-UNSPEC 10/23/2007  . PACEMAKER, PERMANENT 03/04/2008    pt denies this date  . Left lumbar  radiculopathy 05/30/2010  . Sleep apnea     "if I lay flat I quit breathing; HOB up I'm fine"  . Arthritis     "lower back; going back down both my sciatic nerves"  . Aortic root dilatation 02/02/2011  . HYPERTENSION 08/29/2006    takes Lisinopril daily  . Myocardial infarction 12/27/10    "I've had several MIs"  . Muscle spasm     takes Zanaflex daily  . CHF (congestive heart failure)     takes Lasix daily  . Shortness of breath dyspnea     "all my life" with exertion  . History of migraine     87yr ago  . DIABETES MELLITUS, TYPE II 08/29/2006    takes Metformin,Januvia,and Glipizide  daily  . CORONARY ARTERY DISEASE 08/29/2006    takes Coumadin daily  . 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 Peripheral neuropathy     takes Gabapentin daily  . Chronic back pain     HNP   . Cataracts, bilateral     immature  . Presence of permanent cardiac pacemaker    Past Surgical History  Procedure Laterality Date  . S/p left arm surgury after work accident  1991    "2000# steel fell on it"  . S/p right hand surgury for foreign object  1970's    "piece of wood went in my hand; had to get that out"  .  Insert / replace / remove pacemaker  ~ 2004    initial pacemaker placement  . Insert / replace / remove pacemaker  10/2009    generator change  . Colonoscopy    . Esophagogastroduodenoscopy N/A 12/01/2013    Procedure: ESOPHAGOGASTRODUODENOSCOPY (EGD);  Surgeon: Lafayette Dragon, MD;  Location: [facility];  Service: Endoscopy;  Laterality: N/A;  . Coronary artery bypass graft  1992    CABG X 2  . Coronary angioplasty with stent placement  12/27/10    "I've had a total of 9 cardiac stents put in"  . Lumbar laminectomy/decompression microdiscectomy Right 03/23/2014    Procedure: LAMINECTOMY AND FORAMINOTOMY RIGHT LUMBAR THREE-FOUR,LUMBAR FOUR-FIVE, LUMBAR FIVE-SACRAL ONE;  Surgeon: Charlie Pitter, MD;  Location: [facility];  Service: Neurosurgery;  Laterality: Right;  right    reports that  he has quit smoking. His smoking use included Cigarettes. He has a 27 pack-year smoking history. He has quit using smokeless tobacco. He reports that he does not drink alcohol or use illicit drugs. family history includes Coronary artery disease (age of onset: 78) in his other; Diabetes in his mother, other, and sister; Heart disease in his sister and sister; Lung cancer in his sister. Allergies  Allergen Reactions  . Crestor [Rosuvastatin Calcium] Other (See Comments)    Urination of blood   Current Outpatient Prescriptions on File Prior to Visit  Medication Sig Dispense Refill  . allopurinol (ZYLOPRIM) 300 MG tablet take 1 tablet by mouth once daily (Patient taking differently: Take 300 mg by mouth daily. ) 90 tablet 3  . atorvastatin (LIPITOR) 80 MG tablet Take 1 tablet (80 mg total) by mouth daily. 90 tablet 3  . Blood Glucose Monitoring Suppl (ONE TOUCH ULTRA SYSTEM KIT) W/DEVICE KIT 1 kit by Does not apply route once. 1 each 0  . carvedilol (COREG) 25 MG tablet Take 1 tablet (25 mg total) by mouth 2 (two) times daily. 180 tablet 3  . Cholecalciferol (VITAMIN D) 1000 UNITS capsule Take 1,000 Units by mouth daily.     . fluticasone (FLONASE) 50 MCG/ACT nasal spray Place 2 sprays into both nostrils 2 (two) times daily. 48 g 3  . furosemide (LASIX) 40 MG tablet Take 1 tablet (40 mg total) by mouth daily. 90 tablet 3  . gabapentin (NEURONTIN) 300 MG capsule Take 2 capsules (600 mg total) by mouth 3 (three) times daily. 540 capsule 3  . glipiZIDE (GLUCOTROL XL) 10 MG 24 hr tablet Take 1 tablet (10 mg total) by mouth daily with breakfast. 90 tablet 3  . glucose blood (ONE TOUCH ULTRA TEST) test strip Use to check blood sugar twice a day Dx E11.40 (Patient taking differently: 1 each by Other route See admin instructions. Check blood sugar 3 times daily) 300 each 3  . ibuprofen (ADVIL,MOTRIN) 200 MG tablet Take 800 mg by mouth daily as needed for mild pain or moderate pain.     Marland Kitchen lisinopril  (PRINIVIL,ZESTRIL) 2.5 MG tablet Take 1 tablet (2.5 mg total) by mouth daily. 90 tablet 3  . metFORMIN (GLUCOPHAGE) 500 MG tablet Take 2 tablets (1,000 mg total) by mouth 2 (two) times daily. 360 tablet 3  . Multiple Vitamin (MULTIVITAMIN WITH MINERALS) TABS tablet Take 1 tablet by mouth every morning.    Marland Kitchen oxyCODONE-acetaminophen (PERCOCET/ROXICET) 5-325 MG per tablet Take 1-2 tablets by mouth every 4 (four) hours as needed for moderate pain. 80 tablet 0  . potassium chloride SA (K-DUR,KLOR-CON) 20 MEQ tablet Take 1 tablet (20  mEq total) by mouth every evening. 90 tablet 3  . predniSONE (DELTASONE) 50 MG tablet Take 1 tablet (50 mg total) by mouth daily. 5 tablet 0  . sitaGLIPtin (JANUVIA) 100 MG tablet Take 1 tablet (100 mg total) by mouth daily. 90 tablet 3  . tamsulosin (FLOMAX) 0.4 MG CAPS capsule Take 1 capsule (0.4 mg total) by mouth every 12 (twelve) hours. (Patient taking differently: Take 0.4 mg by mouth daily. ) 90 capsule 3  . tiZANidine (ZANAFLEX) 4 MG tablet Take 1 tablet (4 mg total) by mouth every 6 (six) hours as needed for muscle spasms. 180 tablet 3  . traMADol (ULTRAM) 50 MG tablet Take 1 tablet (50 mg total) by mouth every 12 (twelve) hours as needed for moderate pain or severe pain. 60 tablet 2  . warfarin (COUMADIN) 5 MG tablet Take 1 tablet (5 mg total) by mouth daily. (Patient taking differently: Take 5 mg by mouth daily. Take 2.5 mg by mouth on Sunday, Tuesday, Thursday, Friday and Saturday. Take 11m by mouth Monday and Wednesday.) 90 tablet 3   Current Facility-Administered Medications on File Prior to Visit  Medication Dose Route Frequency Provider Last Rate Last Dose  . enoxaparin (LOVENOX) injection 100 mg  1 mg/kg Subcutaneous Q12H HEarnie Larsson MD        Review of Systems  Constitutional: Negative for unusual diaphoresis or night sweats HENT: Negative for ringing in ear or discharge Eyes: Negative for double vision or worsening visual disturbance.  Respiratory:  Negative for choking and stridor.   Gastrointestinal: Negative for vomiting or other signifcant bowel change Genitourinary: Negative for hematuria or change in urine volume.  Musculoskeletal: Negative for other MSK pain or swelling Skin: Negative for color change and worsening wound.  Neurological: Negative for tremors and numbness other than noted  Psychiatric/Behavioral: Negative for decreased concentration or agitation other than above       Objective:   Physical Exam BP 138/72 mmHg  Pulse 83  Temp(Src) 97.8 F (36.6 C) (Oral)  Ht '5\' 10"'  (1.778 m)  Wt 228 lb (103.42 kg)  BMI 32.71 kg/m2  SpO2 97% VS noted,  Constitutional: Pt appears in no significant distress HENT: Head: NCAT.  Right Ear: External ear normal.  Left Ear: External ear normal.  Eyes: . Pupils are equal, round, and reactive to light. Conjunctivae and EOM are normal Neck: Normal range of motion. Neck supple.  Cardiovascular: Normal rate and regular rhythm.   Pulmonary/Chest: Effort normal and breath sounds without rales or wheezing.  Neurological: Pt is alert. Not confused , motor grossly intact Skin: Skin is warm. No rash, 2-3+ on RLE below knee, 1+ LLE below the knee Psychiatric: Pt behavior is normal. No agitation.     Assessment & Plan:

## 2014-07-15 NOTE — Assessment & Plan Note (Signed)
Has unusual mid t-b-fib pain for unclear reason, for film,  to f/u any worsening symptoms or concerns, supect may be msk and/or edema related?

## 2014-07-21 ENCOUNTER — Encounter: Payer: Self-pay | Admitting: Internal Medicine

## 2014-07-21 ENCOUNTER — Ambulatory Visit (INDEPENDENT_AMBULATORY_CARE_PROVIDER_SITE_OTHER): Payer: Medicare Other | Admitting: Internal Medicine

## 2014-07-21 ENCOUNTER — Other Ambulatory Visit: Payer: Self-pay | Admitting: *Deleted

## 2014-07-21 VITALS — BP 124/72 | HR 75 | Temp 97.6°F | Ht 70.0 in | Wt 226.0 lb

## 2014-07-21 DIAGNOSIS — E1140 Type 2 diabetes mellitus with diabetic neuropathy, unspecified: Secondary | ICD-10-CM

## 2014-07-21 DIAGNOSIS — M79605 Pain in left leg: Secondary | ICD-10-CM

## 2014-07-21 DIAGNOSIS — E1165 Type 2 diabetes mellitus with hyperglycemia: Secondary | ICD-10-CM

## 2014-07-21 DIAGNOSIS — I5032 Chronic diastolic (congestive) heart failure: Secondary | ICD-10-CM

## 2014-07-21 DIAGNOSIS — I10 Essential (primary) hypertension: Secondary | ICD-10-CM | POA: Diagnosis not present

## 2014-07-21 DIAGNOSIS — IMO0002 Reserved for concepts with insufficient information to code with codable children: Secondary | ICD-10-CM

## 2014-07-21 NOTE — Patient Instructions (Addendum)
Please continue all other medications as before, and refills have been done if requested.  Please have the pharmacy call with any other refills you may need.  Please continue your efforts at being more active, low cholesterol diet, and weight control.  You are otherwise up to date with prevention measures today.  Please keep your appointments with your specialists as you may have planned  Please go to the LAB in the Basement (turn left off the elevator) for the tests to be done tomorrow as you requested  You will be contacted by phone if any changes need to be made immediately.  Otherwise, you will receive a letter about your results with an explanation, but please check with MyChart first.  Please remember to sign up for MyChart if you have not done so, as this will be important to you in the future with finding out test results, communicating by private email, and scheduling acute appointments online when needed.  Please return in 6 months (after Jan 31 2015)  or sooner if needed

## 2014-07-21 NOTE — Assessment & Plan Note (Signed)
stable overall by history and exam, recent data reviewed with pt, and pt to continue medical treatment as before,  to f/u any worsening symptoms or concerns Lab Results  Component Value Date   HGBA1C 8.1* 02/24/2014   For f/u a1c

## 2014-07-21 NOTE — Assessment & Plan Note (Signed)
With some mild improvement, for cont current lasix 80 bid, f/u cr, K today,  to f/u any worsening symptoms or concerns

## 2014-07-21 NOTE — Patient Outreach (Signed)
[facility]) Care Management   07/21/2014  [name] 30-Dec-1945 [id number]  BAY WAYSON is an 69 y.o. male  Subjective: [name] is a 69 year old gentleman who lives in [place] with his wife. [name] has DM Type II, CAD, Hyperlipidemia, HTN, and history of CHF. He has been followed by [facility] for over 2 years because of his multiple medical problems, surgeries, and falls. Most recently, [name] underwent back surgery and has progressed well post-operatively.   I am now working with him on Diabetes management. Diabetes management has been challenging for [name] and he admittedly has difficulty adhering to prescribed carb modified diet. Please see care plan for interventions related to diabetes management.   Objective:  BP 108/60 mmHg  Pulse 63  SpO2 95%  Review of Systems  Constitutional: Negative.   HENT: Negative.   Eyes: Negative.   Respiratory: Negative.   Cardiovascular: Positive for leg swelling.       1-2+ edema RLE; 1+ edema LLE  Gastrointestinal: Negative.   Genitourinary: Negative.   Musculoskeletal: Positive for back pain and joint pain. Negative for falls.  Skin: Negative.   Neurological: Negative.   Psychiatric/Behavioral: Negative.     Physical Exam  Constitutional: He is oriented to person, place, and time. Vital signs are normal. He appears well-developed and well-nourished. He is active.  Cardiovascular: Normal rate, regular rhythm and normal heart sounds.   Respiratory: Effort normal and breath sounds normal.  GI: Soft. Bowel sounds are normal.  Neurological: He is alert and oriented to person, place, and time.  Skin: Skin is warm, dry and intact.  Psychiatric: He has a normal mood and affect. His speech is normal and behavior is normal. Judgment and thought content normal. Cognition and memory are normal.    Current Medications:   Current Outpatient Prescriptions  Medication Sig Dispense Refill   . allopurinol (ZYLOPRIM) 300 MG tablet take 1 tablet by mouth once daily (Patient taking differently: Take 300 mg by mouth daily. ) 90 tablet 3  . atorvastatin (LIPITOR) 80 MG tablet Take 1 tablet (80 mg total) by mouth daily. 90 tablet 3  . Blood Glucose Monitoring Suppl (ONE TOUCH ULTRA SYSTEM KIT) W/DEVICE KIT 1 kit by Does not apply route once. 1 each 0  . carvedilol (COREG) 25 MG tablet Take 1 tablet (25 mg total) by mouth 2 (two) times daily. 180 tablet 3  . Cholecalciferol (VITAMIN D) 1000 UNITS capsule Take 1,000 Units by mouth daily.     . fluticasone (FLONASE) 50 MCG/ACT nasal spray Place 2 sprays into both nostrils 2 (two) times daily. 48 g 3  . furosemide (LASIX) 80 MG tablet Take 1 tablet (80 mg total) by mouth 2 (two) times daily. 60 tablet 5  . gabapentin (NEURONTIN) 300 MG capsule Take 2 capsules (600 mg total) by mouth 3 (three) times daily. 540 capsule 3  . glipiZIDE (GLUCOTROL XL) 10 MG 24 hr tablet Take 1 tablet (10 mg total) by mouth daily with breakfast. 90 tablet 3  . glucose blood (ONE TOUCH ULTRA TEST) test strip Use to check blood sugar twice a day Dx E11.40 (Patient taking differently: 1 each by Other route See admin instructions. Check blood sugar 3 times daily) 300 each 3  . ibuprofen (ADVIL,MOTRIN) 200 MG tablet Take 800 mg by mouth daily as needed for mild pain or moderate pain.     Marland Kitchen lisinopril (PRINIVIL,ZESTRIL) 2.5 MG tablet Take 1 tablet (2.5 mg  total) by mouth daily. 90 tablet 3  . metFORMIN (GLUCOPHAGE) 500 MG tablet Take 2 tablets (1,000 mg total) by mouth 2 (two) times daily. 360 tablet 3  . Multiple Vitamin (MULTIVITAMIN WITH MINERALS) TABS tablet Take 1 tablet by mouth every morning.    Marland Kitchen oxyCODONE-acetaminophen (PERCOCET/ROXICET) 5-325 MG per tablet Take 1-2 tablets by mouth every 4 (four) hours as needed for moderate pain. 80 tablet 0  . potassium chloride SA (K-DUR,KLOR-CON) 20 MEQ tablet Take 1 tablet (20 mEq total) by mouth every evening. 90 tablet 3   . sitaGLIPtin (JANUVIA) 100 MG tablet Take 1 tablet (100 mg total) by mouth daily. 90 tablet 3  . tamsulosin (FLOMAX) 0.4 MG CAPS capsule Take 1 capsule (0.4 mg total) by mouth every 12 (twelve) hours. (Patient taking differently: Take 0.4 mg by mouth daily. ) 90 capsule 3  . tiZANidine (ZANAFLEX) 4 MG tablet Take 1 tablet (4 mg total) by mouth every 6 (six) hours as needed for muscle spasms. 180 tablet 3  . traMADol (ULTRAM) 50 MG tablet Take 1 tablet (50 mg total) by mouth every 12 (twelve) hours as needed for moderate pain or severe pain. 60 tablet 2  . warfarin (COUMADIN) 5 MG tablet Take 1 tablet (5 mg total) by mouth daily. (Patient taking differently: Take 5 mg by mouth daily. Take 2.5 mg by mouth on Sunday, Tuesday, Thursday, Friday and Saturday. Take 30m by mouth Monday and Wednesday.) 90 tablet 3  . predniSONE (DELTASONE) 50 MG tablet Take 1 tablet (50 mg total) by mouth daily. (Patient not taking: Reported on 07/21/2014) 5 tablet 0   No current facility-administered medications for this visit.                          Functional Status:   In your present state of health, do you have any difficulty performing the following activities: 06/18/2014 03/23/2014  Hearing? N Y  Vision? N N  Difficulty concentrating or making decisions? N N  Walking or climbing stairs? Y Y  Dressing or bathing? N N  Doing errands, shopping? N -  Preparing Food and eating ? N -  Using the Toilet? N -  In the past six months, have you accidently leaked urine? N -  Do you have problems with loss of bowel control? N -  Managing your Medications? N -  Managing your Finances? N -  Housekeeping or managing your Housekeeping? N -    Fall/Depression Screening:    PHQ 2/9 Scores 05/05/2014 02/24/2014 02/18/2013 08/15/2012  PHQ - 2 Score 0 1 0 0    Assessment:    Chronic Health Condition (DM) - Mr. PDunnawayhas seen a slight improvement in 08/12/28 day cbg averages this past month; he had HgA1C drawn today at  his primary care provider's office; he takes medications as prescribed; Mr. PLackois very active and has been walking for exercise but admits he could improve in this area; his greatest area of struggle in Diabetes Management is adherence to prescribed carb modified diet; I have offered to refer him to the dietician at [facility] and [place] he has declined; see care plan for details/interventions  Chronic Health Condition (CHF) - Mr. PFeatherstonhas not had CHF exacerbation symptoms; he has mild edema of the right lower extremity today but, per Dr. JGwynn Burlyexamination note, this is soft tissue swelling and improved; Mr. PAnackeris taking Lasix 867mBID as prescribed; he  does not adhere to a sodium restricted diet  Weights: 07/21/14 = 226# 07/15/14 = 228# 06/18/14 = 220#  Chronic Health Condition (HTN) - [name] blood pressure has been in provider recommended range for several months; he takes medications as prescribed  07/21/14 = 124/72 07/15/14 = 138/72 06/18/14 = 102/66  Plan:   [place] Problem Two        Patient Outreach from 07/21/2014 in [place] Problem Two  Diabetes Management Concerns   Care Plan for Problem Two  Active   Interventions for Problem Two Long Term Goal   utilizing teachback method, reviewed with patient provider recommended cbg averages, recommended HgA1C goals   THN Long Term Goal (31-90) days  patient will have cbg averages < 200 over the next 31 days as evidenced by glucose monitor readings   THN Long Term Goal Start Date  07/21/14   THN CM Short Term Goal #1 (0-30 days)  over the next 30 days patient will ambulate for exercise 10 minutes 1-2x daily x 4 days/week   THN CM Short Term Goal #1 Start Date  07/21/14   Interventions for Short Term Goal #2   utilizing teachback method, reviewed role of exercise in diabetes management,  provided Emmi education   THN CM Short Term Goal #2 (0-30 days)   over the next 30 days, patient will closely monitor and manage carbohydrate intake as evidenced by patient verbal report and documentation in [place] CM Short Term Goal #2 Start Date  07/21/14   Interventions for Short Term Goal #2  utilizing teachback method, reviewed prescribed carb modified diet,  provided emmi education        [place] [place] [facility]  [phone]

## 2014-07-21 NOTE — Assessment & Plan Note (Signed)
stable overall by history and exam, recent data reviewed with pt, and pt to continue medical treatment as before,  to f/u any worsening symptoms or concerns BP Readings from Last 3 Encounters:  07/21/14 124/72  07/15/14 138/72  06/18/14 102/66

## 2014-07-21 NOTE — Progress Notes (Signed)
Pre visit review using our clinic review tool, if applicable. No additional management support is needed unless otherwise documented below in the visit note. 

## 2014-07-21 NOTE — Progress Notes (Signed)
Subjective:    Patient ID: [name], male    DOB: 22-Oct-1945, 69 y.o.   MRN: [id number]  HPI Here to f/u; overall doing ok,  Pt denies chest pain, increasing sob or doe, wheezing, orthopnea, PND, increased LE swelling, palpitations, dizziness or syncope. Wt is onlyt down 2 lbs but he is pleased with the improvemenet in his RLE edema  Pt denies new neurological symptoms such as new headache, or facial or extremity weakness or numbness.  Pt denies polydipsia, polyuria, or low sugar episode.   Pt denies new neurological symptoms such as new headache, or facial or extremity weakness or numbness.   Pt states overall good compliance with meds, mostly trying to follow appropriate diet, with wt overall stable,  but little exercise however Wt Readings from Last 3 Encounters:  07/21/14 226 lb (102.513 kg)  07/15/14 228 lb (103.42 kg)  06/18/14 220 lb (99.791 kg)  Left leg pain improved.  No new compalints Past Medical History  Diagnosis Date  . 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 HYPERLIPIDEMIA 08/29/2006    takes Atorvastatin daily  . GOUT 04/22/2007    takes Allopurinol daily  . DEPRESSION 08/29/2006  . 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 DIASTOLIC HEART FAILURE, CHRONIC 06/09/2008  . BENIGN PROSTATIC HYPERTROPHY 08/29/2006    takes Flomax daily  . LUMBAR RADICULOPATHY, RIGHT 06/10/2007  . INSOMNIA-SLEEP DISORDER-UNSPEC 10/23/2007  . PACEMAKER, PERMANENT 03/04/2008    pt denies this date  . Left lumbar radiculopathy 05/30/2010  . Sleep apnea     "if I lay flat I quit breathing; HOB up I'm fine"  . Arthritis     "lower back; going back down both my sciatic nerves"  . Aortic root dilatation 02/02/2011  . HYPERTENSION 08/29/2006    takes Lisinopril daily  . Myocardial infarction 12/27/10    "I've had several MIs"  . Muscle spasm     takes Zanaflex daily  . CHF (congestive heart failure)     takes Lasix daily  . Shortness of breath dyspnea     "all my life" with exertion    . History of migraine     70yr ago  . DIABETES MELLITUS, TYPE II 08/29/2006    takes Metformin,Januvia,and Glipizide  daily  . CORONARY ARTERY DISEASE 08/29/2006    takes Coumadin daily  . 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 Peripheral neuropathy     takes Gabapentin daily  . Chronic back pain     HNP   . Cataracts, bilateral     immature  . Presence of permanent cardiac pacemaker    Past Surgical History  Procedure Laterality Date  . S/p left arm surgury after work accident  1991    "2000# steel fell on it"  . S/p right hand surgury for foreign object  1970's    "piece of wood went in my hand; had to get that out"  . Insert / replace / remove pacemaker  ~ 2004    initial pacemaker placement  . Insert / replace / remove pacemaker  10/2009    generator change  . Colonoscopy    . Esophagogastroduodenoscopy N/A 12/01/2013    Procedure: ESOPHAGOGASTRODUODENOSCOPY (EGD);  Surgeon: DLafayette Dragon MD;  [facility];  Service: Endoscopy;  Laterality: N/A;  . Coronary artery bypass graft  1992    CABG X 2  . Coronary angioplasty with stent placement  12/27/10    "I've had a total of 9 cardiac stents put in"  .  Lumbar laminectomy/decompression microdiscectomy Right 03/23/2014    Procedure: LAMINECTOMY AND FORAMINOTOMY RIGHT LUMBAR THREE-FOUR,LUMBAR FOUR-FIVE, LUMBAR FIVE-SACRAL ONE;  Surgeon: Charlie Pitter, MD;  Location: [facility];  Service: Neurosurgery;  Laterality: Right;  right    reports that he has quit smoking. His smoking use included Cigarettes. He has a 27 pack-year smoking history. He has quit using smokeless tobacco. He reports that he does not drink alcohol or use illicit drugs. family history includes Coronary artery disease (age of onset: 25) in his other; Diabetes in his mother, other, and sister; Heart disease in his sister and sister; Lung cancer in his sister. Allergies  Allergen Reactions  . Crestor [Rosuvastatin Calcium] Other (See Comments)     Urination of blood   Current Outpatient Prescriptions on File Prior to Visit  Medication Sig Dispense Refill  . allopurinol (ZYLOPRIM) 300 MG tablet take 1 tablet by mouth once daily (Patient taking differently: Take 300 mg by mouth daily. ) 90 tablet 3  . atorvastatin (LIPITOR) 80 MG tablet Take 1 tablet (80 mg total) by mouth daily. 90 tablet 3  . Blood Glucose Monitoring Suppl (ONE TOUCH ULTRA SYSTEM KIT) W/DEVICE KIT 1 kit by Does not apply route once. 1 each 0  . carvedilol (COREG) 25 MG tablet Take 1 tablet (25 mg total) by mouth 2 (two) times daily. 180 tablet 3  . Cholecalciferol (VITAMIN D) 1000 UNITS capsule Take 1,000 Units by mouth daily.     . fluticasone (FLONASE) 50 MCG/ACT nasal spray Place 2 sprays into both nostrils 2 (two) times daily. 48 g 3  . furosemide (LASIX) 80 MG tablet Take 1 tablet (80 mg total) by mouth 2 (two) times daily. 60 tablet 5  . gabapentin (NEURONTIN) 300 MG capsule Take 2 capsules (600 mg total) by mouth 3 (three) times daily. 540 capsule 3  . glipiZIDE (GLUCOTROL XL) 10 MG 24 hr tablet Take 1 tablet (10 mg total) by mouth daily with breakfast. 90 tablet 3  . glucose blood (ONE TOUCH ULTRA TEST) test strip Use to check blood sugar twice a day Dx E11.40 (Patient taking differently: 1 each by Other route See admin instructions. Check blood sugar 3 times daily) 300 each 3  . ibuprofen (ADVIL,MOTRIN) 200 MG tablet Take 800 mg by mouth daily as needed for mild pain or moderate pain.     Marland Kitchen lisinopril (PRINIVIL,ZESTRIL) 2.5 MG tablet Take 1 tablet (2.5 mg total) by mouth daily. 90 tablet 3  . metFORMIN (GLUCOPHAGE) 500 MG tablet Take 2 tablets (1,000 mg total) by mouth 2 (two) times daily. 360 tablet 3  . Multiple Vitamin (MULTIVITAMIN WITH MINERALS) TABS tablet Take 1 tablet by mouth every morning.    . potassium chloride SA (K-DUR,KLOR-CON) 20 MEQ tablet Take 1 tablet (20 mEq total) by mouth every evening. 90 tablet 3  . sitaGLIPtin (JANUVIA) 100 MG tablet Take  1 tablet (100 mg total) by mouth daily. 90 tablet 3  . tamsulosin (FLOMAX) 0.4 MG CAPS capsule Take 1 capsule (0.4 mg total) by mouth every 12 (twelve) hours. (Patient taking differently: Take 0.4 mg by mouth daily. ) 90 capsule 3  . tiZANidine (ZANAFLEX) 4 MG tablet Take 1 tablet (4 mg total) by mouth every 6 (six) hours as needed for muscle spasms. 180 tablet 3  . traMADol (ULTRAM) 50 MG tablet Take 1 tablet (50 mg total) by mouth every 12 (twelve) hours as needed for moderate pain or severe pain. 60 tablet 2  . warfarin (COUMADIN)  5 MG tablet Take 1 tablet (5 mg total) by mouth daily. (Patient taking differently: Take 5 mg by mouth daily. Take 2.5 mg by mouth on Sunday, Tuesday, Thursday, Friday and Saturday. Take 33m by mouth Monday and Wednesday.) 90 tablet 3  . oxyCODONE-acetaminophen (PERCOCET/ROXICET) 5-325 MG per tablet Take 1-2 tablets by mouth every 4 (four) hours as needed for moderate pain. (Patient not taking: Reported on 07/21/2014) 80 tablet 0  . predniSONE (DELTASONE) 50 MG tablet Take 1 tablet (50 mg total) by mouth daily. (Patient not taking: Reported on 07/21/2014) 5 tablet 0   Current Facility-Administered Medications on File Prior to Visit  Medication Dose Route Frequency Provider Last Rate Last Dose  . enoxaparin (LOVENOX) injection 100 mg  1 mg/kg Subcutaneous Q12H HEarnie Larsson MD        Review of Systems  Constitutional: Negative for unusual diaphoresis or night sweats HENT: Negative for ringing in ear or discharge Eyes: Negative for double vision or worsening visual disturbance.  Respiratory: Negative for choking and stridor.   Gastrointestinal: Negative for vomiting or other signifcant bowel change Genitourinary: Negative for hematuria or change in urine volume.  Musculoskeletal: Negative for other MSK pain or swelling Skin: Negative for color change and worsening wound.  Neurological: Negative for tremors and numbness other than noted  Psychiatric/Behavioral: Negative  for decreased concentration or agitation other than above       Objective:   Physical Exam BP 124/72 mmHg  Pulse 75  Temp(Src) 97.6 F (36.4 C) (Oral)  Ht _0  (1.778 m)  Wt 226 lb (102.513 kg)  BMI 32.43 kg/m2  SpO2 92% VS noted,  Constitutional: Pt appears in no significant distress HENT: Head: NCAT.  Right Ear: External ear normal.  Left Ear: External ear normal.  Eyes: . Pupils are equal, round, and reactive to light. Conjunctivae and EOM are normal Neck: Normal range of motion. Neck supple.  Cardiovascular: Normal rate and regular rhythm.   Pulmonary/Chest: Effort normal and breath sounds without rales or wheezing.  Abd:  Soft, NT, ND, + BS Neurological: Pt is alert. Not confused , motor grossly intact Skin: Skin is warm. No rash, trace to 1+R LE edema Psychiatric: Pt behavior is normal. No agitation.   CLINICAL DATA: Persistent medial left leg pain for 1 month. No known injury.  EXAM: LEFT TIBIA AND FIBULA - 2 VIEW  COMPARISON: Left ankle radiographs 02/18/2007  FINDINGS: No acute fracture or dislocation is identified. Mild deformity is again noted of the distal aspect of the lateral malleolus, unchanged and likely reflecting remote trauma. No lytic or blastic osseous lesion is identified. Vascular calcifications are noted. There is mild soft tissue swelling about the ankle.  IMPRESSION: Soft tissue swelling. No acute osseous abnormality identified.   Electronically Signed  By: ALogan Bores On: 07/15/2014 15:09    Assessment & Plan:

## 2014-07-21 NOTE — Assessment & Plan Note (Signed)
Film neg, cw soft tissue contusion, now resolved, ok to follow

## 2014-07-22 ENCOUNTER — Ambulatory Visit (INDEPENDENT_AMBULATORY_CARE_PROVIDER_SITE_OTHER): Payer: Medicare Other | Admitting: Pharmacist

## 2014-07-22 DIAGNOSIS — I483 Typical atrial flutter: Secondary | ICD-10-CM | POA: Diagnosis not present

## 2014-07-22 DIAGNOSIS — Z7901 Long term (current) use of anticoagulants: Secondary | ICD-10-CM

## 2014-07-22 LAB — POCT INR: INR: 2

## 2014-08-12 ENCOUNTER — Ambulatory Visit (INDEPENDENT_AMBULATORY_CARE_PROVIDER_SITE_OTHER)
Admission: RE | Admit: 2014-08-12 | Discharge: 2014-08-12 | Disposition: A | Discharge status: Home / Self Care | Payer: Medicare Other | Source: Ambulatory Visit | Attending: Family Medicine | Admitting: Family Medicine

## 2014-08-12 ENCOUNTER — Other Ambulatory Visit (INDEPENDENT_AMBULATORY_CARE_PROVIDER_SITE_OTHER): Payer: Medicare Other

## 2014-08-12 ENCOUNTER — Ambulatory Visit (INDEPENDENT_AMBULATORY_CARE_PROVIDER_SITE_OTHER): Payer: Medicare Other | Admitting: Family Medicine

## 2014-08-12 ENCOUNTER — Encounter: Payer: Self-pay | Admitting: Family Medicine

## 2014-08-12 VITALS — BP 114/62 | HR 85 | Ht 70.0 in | Wt 219.0 lb

## 2014-08-12 DIAGNOSIS — M25552 Pain in left hip: Secondary | ICD-10-CM

## 2014-08-12 DIAGNOSIS — M7062 Trochanteric bursitis, left hip: Secondary | ICD-10-CM
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
## 2014-08-12 NOTE — Progress Notes (Signed)
Pre visit review using our clinic review tool, if applicable. No additional management support is needed unless otherwise documented below in the visit note. 

## 2014-08-12 NOTE — Patient Instructions (Signed)
Good to see you.  Ice 20 minutes 2 times daily. Usually after activity and before bed. Exercises 3 times a week if needed pennsaid pinkie amount topically 2 times daily as needed.  Xray downstairs today See me when you need me and always good to see you

## 2014-08-12 NOTE — Assessment & Plan Note (Signed)
Patient does have more of a bursitis noted today. Patient does not have any significant groin pain but I do get x-rays to rule out intra-articular pathology. Patient does have some mild weakness as well as atrophy of the left leg which is on the contralateral side from the radicular symptoms he had previous to his surgery. I am concerned that there is a possibility for nerve root impingement but patient would like to exacerbate all other treatment and diagnosis before he would go after anymore backpathology. We attempted an injection today which I hope will be beneficial. Patient has muscle relaxers as well as tramadol as needed. Patient will do and icing regimen and patient given a trial of topical anti-inflammatory's. Patient come back and see me again 2-3 weeks.

## 2014-08-19 ENCOUNTER — Other Ambulatory Visit: Payer: Self-pay | Admitting: *Deleted

## 2014-08-19 ENCOUNTER — Encounter: Payer: Self-pay | Admitting: *Deleted

## 2014-08-19 NOTE — Patient Outreach (Signed)
[facility]) Care Management   08/19/2014  [name] July 15, 1945 [id number]  [name] is an 69 y.o. male  Subjective: [name] is a 69 year old gentleman who lives in [place] with his wife. [name] has DM Type II, CAD, Hyperlipidemia, HTN, and history of CHF. He has been followed by [facility] for over 2 years because of his multiple medical problems, surgeries, and falls. Most recently, [name] underwent back surgery and has progressed well post-operatively.   I am now working with him on Diabetes management. Diabetes management has been challenging for [name] and he admittedly has difficulty adhering to prescribed carb modified diet. Please see care plan for interventions related to diabetes management.    Objective:  BP 100/60 mmHg  Pulse 60  Ht 1.778 m ('5\' 10"' )  Wt 219 lb (99.338 kg)  BMI 31.42 kg/m2  SpO2 95%  Review of Systems  Constitutional: Positive for weight loss.  HENT: Negative.   Eyes: Negative.   Respiratory: Negative.   Cardiovascular: Negative.   Gastrointestinal: Negative.   Genitourinary: Negative.   Musculoskeletal: Positive for myalgias and joint pain. Negative for falls.  Skin: Negative.   Neurological: Positive for focal weakness.       Left hip weakness  Psychiatric/Behavioral: Negative.     Physical Exam  Constitutional: He is oriented to person, place, and time. Vital signs are normal. He appears well-developed and well-nourished. He is active.  Cardiovascular: Normal rate, regular rhythm and intact distal pulses.   Murmur heard. Respiratory: Effort normal and breath sounds normal. He has no wheezes. He has no rales.  GI: Soft. Bowel sounds are normal.  Musculoskeletal:       Left hip: He exhibits decreased range of motion and decreased strength.  Neurological: He is alert and oriented to person, place, and time.  Skin: Skin is warm and dry.  Psychiatric: He has a normal mood and affect.  His speech is normal and behavior is normal. Judgment and thought content normal. Cognition and memory are normal.    Current Medications:   Current Outpatient Prescriptions  Medication Sig Dispense Refill  . allopurinol (ZYLOPRIM) 300 MG tablet take 1 tablet by mouth once daily (Patient taking differently: Take 300 mg by mouth daily. ) 90 tablet 3  . atorvastatin (LIPITOR) 80 MG tablet Take 1 tablet (80 mg total) by mouth daily. 90 tablet 3  . Blood Glucose Monitoring Suppl (ONE TOUCH ULTRA SYSTEM KIT) W/DEVICE KIT 1 kit by Does not apply route once. 1 each 0  . carvedilol (COREG) 25 MG tablet Take 1 tablet (25 mg total) by mouth 2 (two) times daily. 180 tablet 3  . Cholecalciferol (VITAMIN D) 1000 UNITS capsule Take 1,000 Units by mouth daily.     . fluticasone (FLONASE) 50 MCG/ACT nasal spray Place 2 sprays into both nostrils 2 (two) times daily. 48 g 3  . furosemide (LASIX) 80 MG tablet Take 1 tablet (80 mg total) by mouth 2 (two) times daily. 60 tablet 5  . gabapentin (NEURONTIN) 300 MG capsule Take 2 capsules (600 mg total) by mouth 3 (three) times daily. 540 capsule 3  . glipiZIDE (GLUCOTROL XL) 10 MG 24 hr tablet Take 1 tablet (10 mg total) by mouth daily with breakfast. 90 tablet 3  . glucose blood (ONE TOUCH ULTRA TEST) test strip Use to check blood sugar twice a day Dx E11.40 (Patient taking differently: 1 each by Other route See admin instructions. Check blood  sugar 3 times daily) 300 each 3  . ibuprofen (ADVIL,MOTRIN) 200 MG tablet Take 800 mg by mouth daily as needed for mild pain or moderate pain.     Marland Kitchen lisinopril (PRINIVIL,ZESTRIL) 2.5 MG tablet Take 1 tablet (2.5 mg total) by mouth daily. 90 tablet 3  . metFORMIN (GLUCOPHAGE) 500 MG tablet Take 2 tablets (1,000 mg total) by mouth 2 (two) times daily. 360 tablet 3  . Multiple Vitamin (MULTIVITAMIN WITH MINERALS) TABS tablet Take 1 tablet by mouth every morning.    Marland Kitchen oxyCODONE-acetaminophen (PERCOCET/ROXICET) 5-325 MG per tablet  Take 1-2 tablets by mouth every 4 (four) hours as needed for moderate pain. 80 tablet 0  . potassium chloride SA (K-DUR,KLOR-CON) 20 MEQ tablet Take 1 tablet (20 mEq total) by mouth every evening. 90 tablet 3  . sitaGLIPtin (JANUVIA) 100 MG tablet Take 1 tablet (100 mg total) by mouth daily. 90 tablet 3  . tamsulosin (FLOMAX) 0.4 MG CAPS capsule Take 1 capsule (0.4 mg total) by mouth every 12 (twelve) hours. (Patient taking differently: Take 0.4 mg by mouth daily. ) 90 capsule 3  . tiZANidine (ZANAFLEX) 4 MG tablet Take 1 tablet (4 mg total) by mouth every 6 (six) hours as needed for muscle spasms. 180 tablet 3  . traMADol (ULTRAM) 50 MG tablet Take 1 tablet (50 mg total) by mouth every 12 (twelve) hours as needed for moderate pain or severe pain. 60 tablet 2  . warfarin (COUMADIN) 5 MG tablet Take 1 tablet (5 mg total) by mouth daily. (Patient taking differently: Take 5 mg by mouth daily. Take 2.5 mg by mouth on Sunday, Tuesday, Thursday, Friday and Saturday. Take 67m by mouth Monday and Wednesday.) 90 tablet 3   No current facility-administered medications for this visit.    Functional Status:   In your present state of health, do you have any difficulty performing the following activities: 06/18/2014 03/23/2014  Hearing? N Y  Vision? N N  Difficulty concentrating or making decisions? N N  Walking or climbing stairs? Y Y  Dressing or bathing? N N  Doing errands, shopping? N -  Preparing Food and eating ? N -  Using the Toilet? N -  In the past six months, have you accidently leaked urine? N -  Do you have problems with loss of bowel control? N -  Managing your Medications? N -  Managing your Finances? N -  Housekeeping or managing your Housekeeping? N -    Fall/Depression Screening:    PHQ 2/9 Scores 05/05/2014 02/24/2014 02/18/2013 08/15/2012  PHQ - 2 Score 0 1 0 0    Assessment:    Chronic Health Condition (left hip pain; trochanteric bursitis) - [name] Dr. STamala Collier(pain  management) last week and was given an injection and prescribed Pennsaid 2%; the injection relieved his pain for approximately 48 hours but he says today his pain is back at baseline; he is taking Tramadol as prescribed and uses OTC NSAIDS prn; Mr. PGinleywill see Dr. STamala Julianagain on 8/3.   Chronic Health Condition (DM) - Mr. PDraganhas seen a slight improvement in 08/12/28 day cbg averages; last month's goal was to get 08/12/28 dav averages below 200 and he succeeded; [name] me he had HgA1C drawn last month at his primary care provider's office but I only see a pending order; he takes medications as prescribed; [name] very active and has been walking for exercise but admits he could improve in this area; unfortunately,  he has been having hip pain which makes walking more difficult; his greatest area of struggle in Diabetes Management is adherence to prescribed carb modified diet; I have offered to refer him to the dietician at [facility] in [place] but he has declined; see care plan for details/interventions  7 day average (n=20) = 133 14 day average (n=51) = 176 30 day average (n=100) = 196  Chronic Health Condition (CHF) - [name] has not had CHF exacerbation symptoms; he has very mild edema of the right lower extremity today, likely soft tissue swelling; [name] is taking Lasix 81m BID as prescribed; he does not adhere to a sodium restricted diet  Weights: 08/28/14 = 210# 07/21/14 = 226# 07/15/14 = 228# 06/18/14 = 220#  Chronic Health Condition (HTN) - Mr. PSexsonblood pressure has been in provider recommended range for several months; he takes medications as prescribed  08/19/14 = 102/60 07/21/14 = 124/72 07/15/14 = 138/72 06/18/14 = 102/66  Plan:   Mr. PBurdiwill see Dr. STamala Julianagain next week for ongoing pain maangement.   [facility] Problem [facility]       Patient Outreach from 08/19/2014 in [place]  Diabetes Management Concerns   Care Plan for Problem Two  Active   Interventions for Problem Two Long Term Goal   utilizing teachback method, reviewed with patient provider recommended cbg averages, recommended HgA1C goals   THN Long Term Goal (31-90) days  patient will have cbg averages < 200 over the next 31 days as evidenced by glucose monitor readings   THN Long Term Goal Start Date  07/21/14   THN Long Term Goal Met Date  08/19/14   THN CM Short Term Goal #1 (0-30 days)  over the next 30 days patient will ambulate for exercise 5-10 minutes daily x 4 days/week if hip pain permits   THN CM Short Term Goal #1 Start Date  08/19/14   THN CM Short Term Goal #1 Met Date   -- [not met,  reset for next 30 days]   Interventions for Short Term Goal #2   utilizing teachback method, reviewed role of exercise in diabetes management,  provided Emmi education   THN CM Short Term Goal #2 (0-30 days)  over the next 30 days, patient will closely monitor and manage carbohydrate intake as evidenced by patient verbal report and documentation in [place] Short Term Goal #2 Start Date  07/21/14   [facility] Short Term Goal #2 Met Date  08/19/14   Interventions for Short Term Goal #2  utilizing teachback method, reviewed prescribed carb modified diet,  provided emmi education       [facility]  ([phone]

## 2014-08-20 ENCOUNTER — Ambulatory Visit: Payer: Medicare Other | Admitting: *Deleted

## 2014-08-25 ENCOUNTER — Ambulatory Visit: Payer: Medicare Other | Admitting: Internal Medicine

## 2014-08-25 ENCOUNTER — Other Ambulatory Visit: Payer: Self-pay | Admitting: Internal Medicine

## 2014-08-26 ENCOUNTER — Other Ambulatory Visit: Payer: Self-pay

## 2014-08-26 MED ORDER — TAMSULOSIN HCL 0.4 MG PO CAPS: 0.4000 mg | ORAL_CAPSULE | Freq: Two times a day (BID) | ORAL | Status: DC

## 2014-09-02 ENCOUNTER — Ambulatory Visit (INDEPENDENT_AMBULATORY_CARE_PROVIDER_SITE_OTHER): Payer: Medicare Other | Admitting: Family Medicine

## 2014-09-02 ENCOUNTER — Ambulatory Visit: Payer: Medicare Other | Admitting: Pharmacist Clinician (PhC)/ Clinical Pharmacy Specialist

## 2014-09-02 ENCOUNTER — Encounter: Payer: Self-pay | Admitting: Family Medicine

## 2014-09-02 VITALS — BP 110/64 | HR 79 | Ht 70.0 in | Wt 220.0 lb

## 2014-09-02 DIAGNOSIS — M7062 Trochanteric bursitis, left hip: Secondary | ICD-10-CM
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
## 2014-09-02 NOTE — Progress Notes (Signed)
Pre visit review using our clinic review tool, if applicable. No additional management support is needed unless otherwise documented below in the visit note. 

## 2014-09-02 NOTE — Patient Instructions (Addendum)
Always good to see you  I am happy the hip is doing better Looks like your back is dong well Do the capsicin up to 4 times daily in the area on the back.  Lift with the legs at this time and straight  Enjoy the cup of Joe Sorry about the lipoma See me when you need me.

## 2014-09-02 NOTE — Assessment & Plan Note (Signed)
Improving after injection. Patient will continue some of the home exercises and icing protocol. Patient topical anti-inflammatory over-the-counter. Patient will come back and see me again as needed

## 2014-09-07 ENCOUNTER — Emergency Department (HOSPITAL_COMMUNITY)
Admission: EM | Admit: 2014-09-07 | Discharge: 2014-09-07 | Disposition: A | Discharge status: Home / Self Care | Payer: Medicare Other | Attending: Emergency Medicine | Admitting: Emergency Medicine

## 2014-09-07 ENCOUNTER — Emergency Department (HOSPITAL_COMMUNITY): Payer: Medicare Other

## 2014-09-07 ENCOUNTER — Encounter (HOSPITAL_COMMUNITY): Payer: Self-pay | Admitting: Emergency Medicine

## 2014-09-07 ENCOUNTER — Ambulatory Visit (INDEPENDENT_AMBULATORY_CARE_PROVIDER_SITE_OTHER): Payer: Medicare Other | Admitting: Pharmacist Clinician (PhC)/ Clinical Pharmacy Specialist

## 2014-09-07 DIAGNOSIS — F329 Major depressive disorder, single episode, unspecified: Secondary | ICD-10-CM | POA: Insufficient documentation

## 2014-09-07 DIAGNOSIS — E119 Type 2 diabetes mellitus without complications: Secondary | ICD-10-CM | POA: Diagnosis not present

## 2014-09-07 DIAGNOSIS — Z79899 Other long term (current) drug therapy: Secondary | ICD-10-CM | POA: Insufficient documentation

## 2014-09-07 DIAGNOSIS — I5032 Chronic diastolic (congestive) heart failure: Secondary | ICD-10-CM | POA: Insufficient documentation

## 2014-09-07 DIAGNOSIS — G8929 Other chronic pain: Secondary | ICD-10-CM | POA: Insufficient documentation

## 2014-09-07 DIAGNOSIS — I2510 Atherosclerotic heart disease of native coronary artery without angina pectoris: Secondary | ICD-10-CM | POA: Insufficient documentation

## 2014-09-07 DIAGNOSIS — M545 Low back pain, unspecified: Secondary | ICD-10-CM

## 2014-09-07 DIAGNOSIS — Z7901 Long term (current) use of anticoagulants: Secondary | ICD-10-CM | POA: Insufficient documentation

## 2014-09-07 DIAGNOSIS — M1990 Unspecified osteoarthritis, unspecified site: Secondary | ICD-10-CM | POA: Diagnosis not present

## 2014-09-07 DIAGNOSIS — Z950 Presence of cardiac pacemaker: Secondary | ICD-10-CM | POA: Insufficient documentation

## 2014-09-07 DIAGNOSIS — M47816 Spondylosis without myelopathy or radiculopathy, lumbar region: Secondary | ICD-10-CM | POA: Diagnosis not present

## 2014-09-07 DIAGNOSIS — M109 Gout, unspecified: Secondary | ICD-10-CM | POA: Insufficient documentation

## 2014-09-07 DIAGNOSIS — I483 Typical atrial flutter: Secondary | ICD-10-CM | POA: Diagnosis not present

## 2014-09-07 DIAGNOSIS — I10 Essential (primary) hypertension: Secondary | ICD-10-CM | POA: Diagnosis not present

## 2014-09-07 DIAGNOSIS — E785 Hyperlipidemia, unspecified: Secondary | ICD-10-CM | POA: Insufficient documentation

## 2014-09-07 DIAGNOSIS — Z87891 Personal history of nicotine dependence: Secondary | ICD-10-CM | POA: Diagnosis not present

## 2014-09-07 LAB — POCT INR: INR: 2

## 2014-09-07 MED ORDER — HYDROCODONE-ACETAMINOPHEN 5-325 MG PO TABS: 1.0000 | ORAL_TABLET | ORAL | Status: DC | PRN

## 2014-09-07 MED ORDER — OXYCODONE-ACETAMINOPHEN 5-325 MG PO TABS
ORAL_TABLET | ORAL | Status: AC
  Administered 2014-09-07: 1 via ORAL
  Filled 2014-09-07: qty 1

## 2014-09-07 MED ORDER — OXYCODONE-ACETAMINOPHEN 5-325 MG PO TABS
1.0000 | ORAL_TABLET | Freq: Once | ORAL | Status: AC
  Administered 2014-09-07: 1 via ORAL

## 2014-09-07 MED ORDER — HYDROCODONE-ACETAMINOPHEN 5-325 MG PO TABS
1.0000 | ORAL_TABLET | Freq: Once | ORAL | Status: AC
  Administered 2014-09-07: 1 via ORAL
  Filled 2014-09-07: qty 1

## 2014-09-07 NOTE — ED Notes (Signed)
Pt sts left sided lower back pain with radiation down left leg after twisting the wrong way today

## 2014-09-07 NOTE — Discharge Instructions (Signed)
Back Pain, Adult °Low back pain is very common. About 1 in 5 people have back pain. The cause of low back pain is rarely dangerous. The pain often gets better over time. About half of people with a sudden onset of back pain feel better in just 2 weeks. About 8 in 10 people feel better by 6 weeks.  °CAUSES °Some common causes of back pain include: °· Strain of the muscles or ligaments supporting the spine. °· Wear and tear (degeneration) of the spinal discs. °· Arthritis. °· Direct injury to the back. °DIAGNOSIS °Most of the time, the direct cause of low back pain is not known. However, back pain can be treated effectively even when the exact cause of the pain is unknown. Answering your caregiver's questions about your overall health and symptoms is one of the most accurate ways to make sure the cause of your pain is not dangerous. If your caregiver needs more information, he or she may order lab work or imaging tests (X-rays or MRIs). However, even if imaging tests show changes in your back, this usually does not require surgery. °HOME CARE INSTRUCTIONS °For many people, back pain returns. Since low back pain is rarely dangerous, it is often a condition that people can learn to manage on their own.  °· Remain active. It is stressful on the back to sit or stand in one place. Do not sit, drive, or stand in one place for more than 30 minutes at a time. Take short walks on level surfaces as soon as pain allows. Try to increase the length of time you walk each day. °· Do not stay in bed. Resting more than 1 or 2 days can delay your recovery. °· Do not avoid exercise or work. Your body is made to move. It is not dangerous to be active, even though your back may hurt. Your back will likely heal faster if you return to being active before your pain is gone. °· Pay attention to your body when you  bend and lift. Many people have less discomfort when lifting if they bend their knees, keep the load close to their bodies, and  avoid twisting. Often, the most comfortable positions are those that put less stress on your recovering back. °· Find a comfortable position to sleep. Use a firm mattress and lie on your side with your knees slightly bent. If you lie on your back, put a pillow under your knees. °· Only take over-the-counter or prescription medicines as directed by your caregiver. Over-the-counter medicines to reduce pain and inflammation are often the most helpful. Your caregiver may prescribe muscle relaxant drugs. These medicines help dull your pain so you can more quickly return to your normal activities and healthy exercise. °· Put ice on the injured area. °¨ Put ice in a plastic bag. °¨ Place a towel between your skin and the bag. °¨ Leave the ice on for 15-20 minutes, 03-04 times a day for the first 2 to 3 days. After that, ice and heat may be alternated to reduce pain and spasms. °· Ask your caregiver about trying back exercises and gentle massage. This may be of some benefit. °· Avoid feeling anxious or stressed. Stress increases muscle tension and can worsen back pain. It is important to recognize when you are anxious or stressed and learn ways to manage it. Exercise is a great option. °SEEK MEDICAL CARE IF: °· You have pain that is not relieved with rest or medicine. °· You have pain that does not improve in 1 week. °· You have new symptoms. °· You are generally not feeling well. °SEEK   IMMEDIATE MEDICAL CARE IF:   You have pain that radiates from your back into your legs.  You develop new bowel or bladder control problems.  You have unusual weakness or numbness in your arms or legs.  You develop nausea or vomiting.  You develop abdominal pain.  You feel faint. Document Released: 01/16/2005 Document Revised: 07/18/2011 Document Reviewed: 05/20/2013 [facility] Patient Information 2015 [place], [place]. This information is not intended to replace advice given to you by your health care provider. Make sure you  discuss any questions you have with your health care provider.   You may take the hydrocodone prescribed for pain relief.  This will make you drowsy - do not drive within 4 hours of taking this medication.
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
## 2014-09-09 ENCOUNTER — Encounter: Payer: Self-pay | Admitting: *Deleted

## 2014-09-09 DIAGNOSIS — Z6832 Body mass index (BMI) 32.0-32.9, adult: Secondary | ICD-10-CM | POA: Diagnosis not present

## 2014-09-09 DIAGNOSIS — Z006 Encounter for examination for normal comparison and control in clinical research program: Secondary | ICD-10-CM

## 2014-09-09 DIAGNOSIS — M5416 Radiculopathy, lumbar region: Secondary | ICD-10-CM | POA: Diagnosis not present

## 2014-09-09 NOTE — Progress Notes (Addendum)
STATUS FIRST Informed Consent   Subject Name: [name]    Subject met inclusion and exclusion criteria.The informed consent form, study requirements and expectations were reviewed with the subject and questions and concerns were addressed prior to the signing of the consent form. The subject verbalized understanding of the trail requirements. The subject agreed to participate in the STATUS FIRST research study and signed the informed consent at 10:15 on 09-09-14. The informed consent was obtained prior to performance of any protocol-specific procedures for the subject. A copy of the signed informed consent was given to the subject and a copy was placed in the subjects's medical record.    [name] 09/09/14 10:15

## 2014-09-16 ENCOUNTER — Encounter: Payer: Self-pay | Admitting: *Deleted

## 2014-09-16 ENCOUNTER — Other Ambulatory Visit: Payer: Self-pay | Admitting: *Deleted

## 2014-09-16 NOTE — Patient Outreach (Signed)
[facility]) Care Management   09/16/2014  [name] 1945-10-31 [id number]  [name] is an 69 y.o. male  Subjective: [name] is a 69 year old gentleman who lives in [place] with his wife. [name] has DM Type II, CAD, Hyperlipidemia, HTN, and history of CHF. He has been followed by [facility] for over 2 years because of his multiple medical problems, surgeries, and falls. Most recently, [name] underwent back surgery and has progressed well post-operatively.   I am now working with him on Diabetes management. Diabetes management has been challenging for [name] and he admittedly has difficulty adhering to prescribed carb modified diet. Please see care plan for interventions related to diabetes management.   Objective:  BP 110/62 mmHg  Pulse 62  Ht 1.778 m ('5\' 10"' )  Wt 210 lb (95.255 kg)  BMI 30.13 kg/m2  SpO2 98%  Review of Systems  Constitutional: Negative.   HENT: Negative.   Eyes: Negative.   Respiratory: Negative for cough, shortness of breath and wheezing.   Cardiovascular: Positive for leg swelling.       1+ edema right lower extremity mid calf to ankle  Gastrointestinal: Negative.   Genitourinary: Negative.   Musculoskeletal: Positive for myalgias, back pain and joint pain. Negative for falls.  Skin: Negative.   Neurological: Negative.   Psychiatric/Behavioral: Negative.     Physical Exam  Constitutional: He is oriented to person, place, and time. Vital signs are normal. He appears well-developed and well-nourished. He is active.  Cardiovascular: Normal rate.   Respiratory: Effort normal and breath sounds normal.  GI: Soft. Bowel sounds are normal.  Neurological: He is alert and oriented to person, place, and time.  Skin: Skin is warm and dry.  Psychiatric: He has a normal mood and affect. His behavior is normal. Judgment and thought content normal.    Current Medications:   Current Outpatient Prescriptions   Medication Sig Dispense Refill  . allopurinol (ZYLOPRIM) 300 MG tablet take 1 tablet by mouth once daily (Patient taking differently: Take 300 mg by mouth daily. ) 90 tablet 3  . atorvastatin (LIPITOR) 80 MG tablet Take 1 tablet (80 mg total) by mouth daily. 90 tablet 3  . Blood Glucose Monitoring Suppl (ONE TOUCH ULTRA SYSTEM KIT) W/DEVICE KIT 1 kit by Does not apply route once. 1 each 0  . carvedilol (COREG) 25 MG tablet Take 1 tablet (25 mg total) by mouth 2 (two) times daily. 180 tablet 3  . Cholecalciferol (VITAMIN D) 1000 UNITS capsule Take 1,000 Units by mouth daily.     . fluticasone (FLONASE) 50 MCG/ACT nasal spray Place 2 sprays into both nostrils 2 (two) times daily. 48 g 3  . furosemide (LASIX) 80 MG tablet Take 1 tablet (80 mg total) by mouth 2 (two) times daily. 60 tablet 5  . gabapentin (NEURONTIN) 300 MG capsule Take 2 capsules (600 mg total) by mouth 3 (three) times daily. 540 capsule 3  . glipiZIDE (GLUCOTROL XL) 10 MG 24 hr tablet Take 1 tablet (10 mg total) by mouth daily with breakfast. 90 tablet 3  . glucose blood (ONE TOUCH ULTRA TEST) test strip Use to check blood sugar twice a day Dx E11.40 (Patient taking differently: 1 each by Other route See admin instructions. Check blood sugar 3 times daily) 300 each 3  . HYDROcodone-acetaminophen (NORCO/VICODIN) 5-325 MG per tablet Take 1 tablet by mouth every 4 (four) hours as needed for moderate pain. 15 tablet 0  .  ibuprofen (ADVIL,MOTRIN) 200 MG tablet Take 800 mg by mouth daily as needed for mild pain or moderate pain.     Marland Kitchen lisinopril (PRINIVIL,ZESTRIL) 2.5 MG tablet Take 1 tablet (2.5 mg total) by mouth daily. 90 tablet 3  . metFORMIN (GLUCOPHAGE) 500 MG tablet Take 2 tablets (1,000 mg total) by mouth 2 (two) times daily. 360 tablet 3  . Multiple Vitamin (MULTIVITAMIN WITH MINERALS) TABS tablet Take 1 tablet by mouth every morning.    Marland Kitchen oxyCODONE-acetaminophen (PERCOCET/ROXICET) 5-325 MG per tablet Take 1-2 tablets by mouth  every 4 (four) hours as needed for moderate pain. 80 tablet 0  . potassium chloride SA (K-DUR,KLOR-CON) 20 MEQ tablet Take 1 tablet (20 mEq total) by mouth every evening. 90 tablet 3  . sitaGLIPtin (JANUVIA) 100 MG tablet Take 1 tablet (100 mg total) by mouth daily. 90 tablet 3  . tamsulosin (FLOMAX) 0.4 MG CAPS capsule Take 1 capsule (0.4 mg total) by mouth every 12 (twelve) hours. 90 capsule 3  . tiZANidine (ZANAFLEX) 4 MG tablet Take 1 tablet (4 mg total) by mouth every 6 (six) hours as needed for muscle spasms. 180 tablet 3  . traMADol (ULTRAM) 50 MG tablet Take 1 tablet (50 mg total) by mouth every 12 (twelve) hours as needed for moderate pain or severe pain. 60 tablet 2  . warfarin (COUMADIN) 5 MG tablet Take 1 tablet (5 mg total) by mouth daily. (Patient taking differently: Take 5 mg by mouth daily. Take 2.5 mg by mouth on Sunday, Tuesday, Thursday, Friday and Saturday. Take 43m by mouth Monday and Wednesday.) 90 tablet 3   No current facility-administered medications for this visit.    Functional Status:   In your present state of health, do you have any difficulty performing the following activities: 08/19/2014 06/18/2014  Hearing? N N  Vision? N N  Difficulty concentrating or making decisions? N N  Walking or climbing stairs? Y Y  Dressing or bathing? N N  Doing errands, shopping? N N  Preparing Food and eating ? - N  Using the Toilet? - N  In the past six months, have you accidently leaked urine? - N  Do you have problems with loss of bowel control? - N  Managing your Medications? - N  Managing your Finances? - N  Housekeeping or managing your Housekeeping? - N    Fall/Depression Screening:    PHQ 2/9 Scores 08/19/2014 05/05/2014 02/24/2014 02/18/2013 08/15/2012  PHQ - 2 Score 0 0 1 0 0    Assessment:    Chronic Health Condition (left hip pain; trochanteric bursitis) - Mr. PBarrellsees [name]; most recent injection provided short  term relief; he has been using Pennsaid 2% and Tramadol as prescribed and uses OTC NSAIDS prn; he presented to the ED on 7/20 and received Hydrocodone which he still uses prn; he saw Dr. PTrenton Gammon his surgeon on 08/21/14; his reported pain level currently is "0" but he took prescribed narcotic analgesic this morning  Chronic Health Condition (DM) - Mr. PGrundmanhas seen improvement in 08/12/28 day cbg averages; last month's goal was to get 08/12/28 dav averages below 200 and he succeeded; Mr. PChoetold me he had HgA1C drawn last month at his primary care provider's office but I only see a pending order; he takes medication. His last HgA1C drawn in January was 8.1; he has another venipuncture scheduled for 8/20. His greatest area of struggle in Diabetes Management is adherence to prescribed  carb modified diet; he shared that he thinks his cbg average improvement this month is attributable to decreasing portion size. He was very pleased to report this month's improvements.   June 7 day average (n=20) = 133         July 7 day average (n=19) = 133 June 14 day average (n=51) = 176       July 14 day average (n=42) = 139 June 30 day average (n=100) = 196     July 30 day average (n=89) = 138  Chronic Health Condition (CHF) - Mr. Griffith has not had CHF exacerbation symptoms; he has very mild edema of the right lower extremity today, likely soft tissue swelling; Mr. Cogdell is taking Lasix 13m BID as prescribed; he does not adhere to a sodium restricted diet  Weights: 09/16/14 = 210# 08/28/14 = 210# 07/21/14 = 226# 07/15/14 = 228# 06/18/14 = 220#  Chronic Health Condition (HTN) - Mr. PBerberianblood pressure has been in provider recommended range for several months; he takes medications as prescribed  09/16/14 = 110/62 08/19/14 = 102/60 07/21/14 = 124/72 07/15/14 = 138/72 06/18/14 = 102/66   Plan:   I anticipate discharging Mr. [place]aggifrom active [facility] next month. His is doing a much  better job of self managing his diabetes. He has tools and education in place. His ongoing issues with hip pain and fall risk is concerning but he feels he is improved and "heading in the right direction". I will see Mr. PMullensface to face/in the home next month. Of course, [facility] will be available to Mr. PCheatwoodat any time in the future should he need u[place]    THN CM CARE PLAN    Patient Outreach from 09/16/2014 [place] [facility]  Diabetes Management Concerns   Care Plan for Problem Two  Active   Interventions for Problem Two Long Term Goal   utilizing teachback method, reviewed with patient provider recommended cbg averages, recommended HgA1C goals   THN Long Term Goal (31-90) days  patient will maintain cbg averages < 150 over the next 31 days as evidenced by glucose monitor readings   THN Long Term Goal Start Date  09/16/14   THN CM Short Term Goal #1 (0-30 days)  over the next 30 days patient will ambulate for exercise 5-10 minutes daily x 4 days/week if hip pain permits   THN CM Short Term Goal #1 Start Date  09/16/14   THN CM Short Term Goal #1 Met Date   -- [not met,  reset for next 30 days]   Interventions for Short Term Goal #2   utilizing teachback method, reviewed role of exercise in diabetes management,  provided Emmi education last month and reviewed again today   THN CM Short Term Goal #2 (0-30 days)  over the next 30 days, patient will continue to monitor and manage portion sizes as evidenced by patient verbal report and continued CBG averages < 150   THN CM Short Term Goal #2 Start Date  09/16/14   Interventions for Short Term Goal #2  utilizing teachback method, reviewed prescribed carb modified diet,  provided emmi education       [facility]  [phone]

## 2014-09-17 ENCOUNTER — Ambulatory Visit: Payer: Medicare Other | Admitting: *Deleted

## 2014-09-25 DIAGNOSIS — Z6832 Body mass index (BMI) 32.0-32.9, adult: Secondary | ICD-10-CM | POA: Diagnosis not present

## 2014-09-25 DIAGNOSIS — M5416 Radiculopathy, lumbar region: Secondary | ICD-10-CM | POA: Diagnosis not present

## 2014-10-06 ENCOUNTER — Other Ambulatory Visit: Payer: Self-pay | Admitting: Neurosurgery

## 2014-10-06 DIAGNOSIS — M5416 Radiculopathy, lumbar region: Secondary | ICD-10-CM

## 2014-10-09 ENCOUNTER — Telehealth: Payer: Self-pay | Admitting: Internal Medicine

## 2014-10-09 NOTE — Telephone Encounter (Signed)
Pt's spouse advised in detail to contact Cardiology/Coumadin regarding change in Coumadin prior to procedure ordered by Dr Trenton Gammon. Spouse expressed understanding

## 2014-10-09 NOTE — Telephone Encounter (Signed)
Patient requests to speak with you. He refused to give me any other information other than it has something to do with a test that his surgeon wants him to take.

## 2014-10-14 ENCOUNTER — Telehealth: Payer: Self-pay | Admitting: Cardiology

## 2014-10-14 ENCOUNTER — Other Ambulatory Visit: Payer: Self-pay | Admitting: *Deleted

## 2014-10-14 NOTE — Patient Outreach (Signed)
[facility]) Care Management  10/14/2014  [name] October 23, 1945 [id number]  Follow up re: anticoagulation pre-procedure: Tommy Medal PharmD with [facility] reached out to [name] PB:[id number] management.   I will see [name] at home tomorrow for routine home visit.    [facility]  [phone]

## 2014-10-14 NOTE — Telephone Encounter (Signed)
Alisa from [facility] said that while waiting for me to pick up the call she got an answer to her question of who manages this patients aticoagulation and she will send the message to Kristin's in box

## 2014-10-15 ENCOUNTER — Other Ambulatory Visit: Payer: Self-pay | Admitting: *Deleted

## 2014-10-15 ENCOUNTER — Encounter: Payer: Self-pay | Admitting: *Deleted

## 2014-10-15 NOTE — Patient Outreach (Signed)
[name] [facility]) Care Management   10/15/2014  [name] [birth year] [id number]  [name] is an 69 y.o. male who lives in [place] with his wife. [name] has DM Type II, CAD, Hyperlipidemia, HTN, and history of CHF. He has been followed by [facility] for over 2 years because of his multiple medical problems, surgeries, and falls. Most recently, [name] underwent back surgery and has progressed well post-operatively.   I am now working with him on Diabetes management. Diabetes management has been challenging for [name] and he admittedly has difficulty adhering to prescribed carb modified diet. Please see care plan for interventions related to diabetes management.   Subjective: "This new medicine is working great. I feel like I can get up and do what I need to do."  Objective:   BP 104/62 mmHg  Pulse 70  Ht 1.778 m (_0 )  Wt 205 lb (92.987 kg)  BMI 29.41 kg/m2  SpO2 97%  Review of Systems  Constitutional: Negative.   HENT: Negative.   Eyes: Negative.   Respiratory: Negative for cough, sputum production, shortness of breath and wheezing.   Cardiovascular: Positive for leg swelling.       1-2+ edema consistent with baseline right ankle/foot  Gastrointestinal: Negative.   Genitourinary: Negative.   Musculoskeletal: Positive for myalgias and back pain. Negative for falls.  Skin: Negative.   Neurological: Negative.   Psychiatric/Behavioral: Negative.     Physical Exam  Constitutional: He is oriented to person, place, and time. Vital signs are normal. He appears well-developed and well-nourished. He is active.  Cardiovascular: Normal rate and regular rhythm.   Respiratory: Effort normal and breath sounds normal.  GI: Soft. Bowel sounds are normal.  Musculoskeletal: He exhibits edema.  1-2+ edema RLE consistent with baseline  Neurological: He is alert and oriented to person, place, and time.  Skin: Skin is warm and dry.  Laceration noted.     Psychiatric: He has a normal mood and affect. His speech is normal and behavior is normal. Judgment and thought content normal. Cognition and memory are normal.    Current Medications:   Current Outpatient Prescriptions  Medication Sig Dispense Refill  . allopurinol (ZYLOPRIM) 300 MG tablet take 1 tablet by mouth once daily (Patient taking differently: Take 300 mg by mouth daily. ) 90 tablet 3  . atorvastatin (LIPITOR) 80 MG tablet Take 1 tablet (80 mg total) by mouth daily. 90 tablet 3  . Blood Glucose Monitoring Suppl (ONE TOUCH ULTRA SYSTEM KIT) W/DEVICE KIT 1 kit by Does not apply route once. 1 each 0  . carvedilol (COREG) 25 MG tablet Take 1 tablet (25 mg total) by mouth 2 (two) times daily. 180 tablet 3  . Cholecalciferol (VITAMIN D) 1000 UNITS capsule Take 1,000 Units by mouth daily.     . fluticasone (FLONASE) 50 MCG/ACT nasal spray Place 2 sprays into both nostrils 2 (two) times daily. 48 g 3  . furosemide (LASIX) 80 MG tablet Take 1 tablet (80 mg total) by mouth 2 (two) times daily. 60 tablet 5  . gabapentin (NEURONTIN) 300 MG capsule Take 2 capsules (600 mg total) by mouth 3 (three) times daily. 540 capsule 3  . glipiZIDE (GLUCOTROL XL) 10 MG 24 hr tablet Take 1 tablet (10 mg total) by mouth daily with breakfast. 90 tablet 3  . glucose blood (ONE TOUCH ULTRA TEST) test strip Use to check blood sugar twice a day Dx E11.40 (Patient taking differently: 1 each  by Other route See admin instructions. Check blood sugar 3 times daily) 300 each 3  .      . ibuprofen (ADVIL,MOTRIN) 200 MG tablet Take 800 mg by mouth daily as needed for mild pain or moderate pain.     Marland Kitchen lisinopril (PRINIVIL,ZESTRIL) 2.5 MG tablet Take 1 tablet (2.5 mg total) by mouth daily. 90 tablet 3  . metFORMIN (GLUCOPHAGE) 500 MG tablet Take 2 tablets (1,000 mg total) by mouth 2 (two) times daily. 360 tablet 3  . Multiple Vitamin (MULTIVITAMIN WITH MINERALS) TABS tablet Take 1 tablet by mouth every  morning.    Marland Kitchen oxyCODONE-acetaminophen (PERCOCET/ROXICET) 10-325 MG per tablet Take 1-2 tablets by mouth every 4 -6 (four to six) hours as needed for moderate pain. 80 tablet 0  . potassium chloride SA (K-DUR,KLOR-CON) 20 MEQ tablet Take 1 tablet (20 mEq total) by mouth every evening. 90 tablet 3  . sitaGLIPtin (JANUVIA) 100 MG tablet Take 1 tablet (100 mg total) by mouth daily. 90 tablet 3  . tamsulosin (FLOMAX) 0.4 MG CAPS capsule Take 1 capsule (0.4 mg total) by mouth every 12 (twelve) hours. 90 capsule 3  . tiZANidine (ZANAFLEX) 4 MG tablet Take 1 tablet (4 mg total) by mouth every 6 (six) hours as needed for muscle spasms. 180 tablet 3  . traMADol (ULTRAM) 50 MG tablet Take 1 tablet (50 mg total) by mouth every 12 (twelve) hours as needed for moderate pain or severe pain. 60 tablet 2  . warfarin (COUMADIN) 5 MG tablet Take 1 tablet (5 mg total) by mouth daily. (Patient taking differently: Take 5 mg by mouth daily. Take 2.5 mg by mouth on Sunday, Tuesday, Thursday, Friday and Saturday. Take 31m by mouth Monday and Wednesday.) 90 tablet 3       Functional Status:   In your present state of health, do you have any difficulty performing the following activities: 08/19/2014 06/18/2014  Hearing? N N  Vision? N N  Difficulty concentrating or making decisions? N N  Walking or climbing stairs? Y Y  Dressing or bathing? N N  Doing errands, shopping? N N  Preparing Food and eating ? - N  Using the Toilet? - N  In the past six months, have you accidently leaked urine? - N  Do you have problems with loss of bowel control? - N  Managing your Medications? - N  Managing your Finances? - N  Housekeeping or managing your Housekeeping? - N    Fall/Depression Screening:    PHQ 2/9 Scores 09/16/2014 08/19/2014 05/05/2014 02/24/2014 02/18/2013 08/15/2012  PHQ - 2 Score 0 0 0 1 0 0    Assessment:   Chronic Health Condition (left hip pain; trochanteric bursitis) - [name]; injections to the left hip/back have provided short term relief; he was using Pennsaid 2% and Tramadol as prescribed and using OTC NSAIDS prn; he presented to the ED on 7/20 and received Hydrocodone which gave him very temporary relief; he saw Dr. PTrenton Gammon his surgeon on 08/21/14 and has followed up with him since; Dr. PTrenton Gammonchanged Mr. Rhames's pain management regimen and he has seen improvement in pain management and mobility; his reported pain level currently is "1-2"; Mr. PSuchyhas an appointment Monday at the coumadin clinic so he can prepare for a myelogram   Chronic Health Condition (DM) - Mr. PDanfordsaw improvement in 08/12/28 day cbg averages during mid July-August but when he began having severe  pain, his cbg's worsened (see details below); he was not eating well and was unable to exercise; however he is seeing Dr. Trenton Gammon and began a new pain regimen yesterday which has enabled him to ambulate freely; he feels that if he has improvement in ability to walk/exercise, he will see improvement in cbg's; his goal is to get cbg's back to averages similar to the July - August period.  His greatest area of struggle in Diabetes Management is adherence to prescribed carb modified diet.   July - August   7 day average (n=20) = 133Aug - September 7 day average (n= 18) = 190 July - August 14 day average (n=42) = 139      Aug - September 14 day average (n= 28) = 165 July - August 30 day average (n=89) = 138      Aug - September 30 day average (n= 80) = 160    Mr. Cowin told me he had HgA1C drawn last month at his primary care provider's office on 8/20 but results are not available;  His last HgA1C drawn in January was 8.1;  I contacted Odis Hollingshead, CMA at Dr. Gwynn Burly office to request results  Chronic Health Condition (CHF) - Mr. Bhakta has not had CHF exacerbation symptoms; he has very mild edema of the right lower extremity today, likely soft tissue swelling and consistent  with baseline; Mr. Osowski is taking Lasix $RemoveBefo'80mg'YaOKxhgpWTX$  BID as prescribed; he does not consistently adhere to a sodium restricted diet  Weights: 10/15/14 = 205# 09/16/14 = 210# 08/28/14 = 210# 07/21/14 = 226# 07/15/14 = 228# 06/18/14 = 220#  Chronic Health Condition (HTN) - Mr. Simenson blood pressure has been in provider recommended range for several months; he takes medications as prescribed  10/15/14 = 104/62 09/16/14 = 110/62 08/19/14 = 102/60 07/21/14 = 124/72 07/15/14 = 138/72 06/18/14 = 102/66  Plan:   Mr. Beilke will report to the coumadin clinic on Monday for scheduled appointment.   [facility] CM Care Plan Problem Two        Most Recent Value   Care Plan Problem Two  Diabetes Management Concerns   Role Documenting the Problem Two  Care Management Coordinator   Care Plan for Problem Two  Active   Interventions for Problem Two Long Term Goal   utilizing teachback method, reviewed with patient provider recommended cbg averages, recommended HgA1C goals   THN Long Term Goal (31-90) days  patient will maintain cbg averages < 150 over the next 31 days as evidenced by glucose monitor readings   THN Long Term Goal Start Date  09/16/14   THN Long Term Goal Met Date  -- [not met,  goal reinstated]   THN CM Short Term Goal #1 (0-30 days)  over the next 30 days patient will ambulate for exercise 5-10 minutes daily x 4 days/week if hip pain permits   THN CM Short Term Goal #1 Start Date  10/15/14   THN CM Short Term Goal #1 Met Date   -- [not met,  reset for next 30 days]   Interventions for Short Term Goal #2   utilizing teachback method, reviewed role of exercise in diabetes management,  provided Emmi education last month and reviewed again today   THN CM Short Term Goal #2 (0-30 days)  over the next 30 days, patient will continue to monitor and manage portion sizes as evidenced by patient verbal report and continued CBG averages < 150   THN CM Short Term Goal #2 Start  Date  10/15/14   THN CM Short Term  Goal #2 Met Date  -- [not met,  reinstated]   Interventions for Short Term Goal #2  utilizing teachback method, reviewed prescribed carb modified diet,  provided emmi education       [facility]  [phone]

## 2014-10-19 ENCOUNTER — Ambulatory Visit (INDEPENDENT_AMBULATORY_CARE_PROVIDER_SITE_OTHER): Payer: Medicare Other | Admitting: Pharmacist Clinician (PhC)/ Clinical Pharmacy Specialist

## 2014-10-19 DIAGNOSIS — I483 Typical atrial flutter: Secondary | ICD-10-CM | POA: Diagnosis not present

## 2014-10-19 DIAGNOSIS — Z7901 Long term (current) use of anticoagulants: Secondary | ICD-10-CM | POA: Diagnosis not present

## 2014-10-19 LAB — POCT INR: INR: 2.3

## 2014-10-28 ENCOUNTER — Ambulatory Visit
Admission: RE | Admit: 2014-10-28 | Discharge: 2014-10-28 | Disposition: A | Discharge status: Home / Self Care | Payer: Medicare Other | Source: Ambulatory Visit | Attending: Neurosurgery | Admitting: Neurosurgery

## 2014-10-28 ENCOUNTER — Ambulatory Visit (INDEPENDENT_AMBULATORY_CARE_PROVIDER_SITE_OTHER): Payer: Medicare Other | Admitting: Pharmacist Clinician (PhC)/ Clinical Pharmacy Specialist

## 2014-10-28 VITALS — BP 92/52 | HR 75

## 2014-10-28 DIAGNOSIS — M5416 Radiculopathy, lumbar region: Secondary | ICD-10-CM

## 2014-10-28 DIAGNOSIS — I483 Typical atrial flutter: Secondary | ICD-10-CM

## 2014-10-28 DIAGNOSIS — M5136 Other intervertebral disc degeneration, lumbar region: Secondary | ICD-10-CM | POA: Diagnosis not present

## 2014-10-28 DIAGNOSIS — M5431 Sciatica, right side: Secondary | ICD-10-CM

## 2014-10-28 DIAGNOSIS — Z7901 Long term (current) use of anticoagulants: Secondary | ICD-10-CM | POA: Diagnosis not present

## 2014-10-28 DIAGNOSIS — M79604 Pain in right leg: Secondary | ICD-10-CM

## 2014-10-28 DIAGNOSIS — M5432 Sciatica, left side: Secondary | ICD-10-CM

## 2014-10-28 DIAGNOSIS — M545 Low back pain: Secondary | ICD-10-CM

## 2014-10-28 DIAGNOSIS — G8929 Other chronic pain: Secondary | ICD-10-CM

## 2014-10-28 DIAGNOSIS — M4806 Spinal stenosis, lumbar region: Secondary | ICD-10-CM | POA: Diagnosis not present

## 2014-10-28 DIAGNOSIS — M48062 Spinal stenosis, lumbar region with neurogenic claudication: Secondary | ICD-10-CM

## 2014-10-28 LAB — POCT INR: INR: 1.1

## 2014-10-28 MED ORDER — MEPERIDINE HCL 100 MG/ML IJ SOLN
75.0000 mg | Freq: Once | INTRAMUSCULAR | Status: AC
  Administered 2014-10-28: 75 mg via INTRAMUSCULAR

## 2014-10-28 MED ORDER — ONDANSETRON HCL 4 MG/2ML IJ SOLN
4.0000 mg | Freq: Once | INTRAMUSCULAR | Status: AC
  Administered 2014-10-28: 4 mg via INTRAMUSCULAR

## 2014-10-28 MED ORDER — DIAZEPAM 5 MG PO TABS
10.0000 mg | ORAL_TABLET | Freq: Once | ORAL | Status: AC
  Administered 2014-10-28: 5 mg via ORAL

## 2014-10-28 MED ORDER — IOHEXOL 180 MG/ML  SOLN
15.0000 mL | Freq: Once | INTRAMUSCULAR | Status: DC | PRN
  Administered 2014-10-28: 15 mL via INTRATHECAL

## 2014-10-28 NOTE — Progress Notes (Addendum)
Pt states he has been off Tramadol Monday and Tuesday.  Discharge instructions explained to pt.  INR is 1.1 today.

## 2014-10-28 NOTE — Discharge Instructions (Signed)
Myelogram Discharge Instructions  1. Go home and rest quietly for the next 24 hours.  It is important to lie flat for the next 24 hours.  Get up only to go to the restroom.  You may lie in the bed or on a couch on your back, your stomach, your left side or your right side.  You may have one pillow under your head.  You may have pillows between your knees while you are on your side or under your knees while you are on your back.  2. DO NOT drive today.  Recline the seat as far back as it will go, while still wearing your seat belt, on the way home.  3. You may get up to go to the bathroom as needed.  You may sit up for 10 minutes to eat.  You may resume your normal diet and medications unless otherwise indicated.  Drink lots of extra fluids today and tomorrow.  4. The incidence of headache, nausea, or vomiting is about 5% (one in 20 patients).  If you develop a headache, lie flat and drink plenty of fluids until the headache goes away.  Caffeinated beverages may be helpful.  If you develop severe nausea and vomiting or a headache that does not go away with flat bed rest, call [phone].  5. You may resume normal activities after your 24 hours of bed rest is over; however, do not exert yourself strongly or do any heavy lifting tomorrow. If when you get up you have a headache when standing, go back to bed and force fluids for another 24 hours.  6. Call your physician for a follow-up appointment.  The results of your myelogram will be sent directly to your physician by the following day.  7. If you have any questions or if complications develop after you arrive home, please call [phone].  Discharge instructions have been explained to the patient.  The patient, or the person responsible for the patient, fully understands these instructions.       May resume Warfarin today.   May resume Tramadol on Sept. 29, 2016, after 11:00 am.

## 2014-10-30 ENCOUNTER — Other Ambulatory Visit: Payer: Self-pay | Admitting: Internal Medicine

## 2014-10-30 NOTE — Telephone Encounter (Signed)
Tizanidine done erx

## 2014-11-10 ENCOUNTER — Telehealth: Payer: Self-pay | Admitting: Pharmacist Clinician (PhC)/ Clinical Pharmacy Specialist

## 2014-11-10 DIAGNOSIS — M5416 Radiculopathy, lumbar region: Secondary | ICD-10-CM | POA: Diagnosis not present

## 2014-11-10 DIAGNOSIS — Z6831 Body mass index (BMI) 31.0-31.9, adult: Secondary | ICD-10-CM | POA: Diagnosis not present

## 2014-11-10 NOTE — Telephone Encounter (Signed)
Closed encounter °

## 2014-11-11 ENCOUNTER — Ambulatory Visit: Payer: Medicare Other | Admitting: Pharmacist Clinician (PhC)/ Clinical Pharmacy Specialist

## 2014-11-11 ENCOUNTER — Telehealth: Payer: Self-pay | Admitting: Pharmacist Clinician (PhC)/ Clinical Pharmacy Specialist

## 2014-11-11 NOTE — Telephone Encounter (Signed)
Patient needs to speak with [name] regarding his upcoming surgery.

## 2014-11-11 NOTE — Telephone Encounter (Signed)
Faxed information to [facility].  Pt has an anticoag appt on 10/18 and will discuss instructions at that time if surgery date is scheduled.

## 2014-11-11 NOTE — Telephone Encounter (Signed)
Spoke with pt.  He needs to have back surgery with Dr. Trenton Gammon ([facility]  He states they want [place] to call them to discuss the date.  Office contact- Vanessa at ext [phone].  I spoke with [facility].  They have cardiac clearance from his procedure in February but just need to discuss anticoagulation.  Pt has a CHADS score of 3.  No history of TIA or CVA.  Per protocol, would not need to be bridged.  Pt did have Lovenox bridge in February per Dr. Jacalyn Lefevre instructions.  He was not bridged for his myelogram last month.  Will send to Dr. Stanford Breed to clarify what he would like [place] to do for this surgery.  Once clarified, will need to fax to [facility] at  [phone] for surgery date to be set.

## 2014-11-11 NOTE — Telephone Encounter (Signed)
No bridge [name]

## 2014-11-16 ENCOUNTER — Other Ambulatory Visit (HOSPITAL_COMMUNITY): Payer: Self-pay | Admitting: Neurosurgery

## 2014-11-17 ENCOUNTER — Ambulatory Visit (INDEPENDENT_AMBULATORY_CARE_PROVIDER_SITE_OTHER): Payer: Medicare Other | Admitting: Pharmacist Clinician (PhC)/ Clinical Pharmacy Specialist

## 2014-11-17 DIAGNOSIS — I483 Typical atrial flutter: Secondary | ICD-10-CM | POA: Diagnosis not present

## 2014-11-17 DIAGNOSIS — Z7901 Long term (current) use of anticoagulants: Secondary | ICD-10-CM | POA: Diagnosis not present

## 2014-11-17 LAB — POCT INR: INR: 1.8

## 2014-11-19 ENCOUNTER — Encounter: Payer: Self-pay | Admitting: *Deleted

## 2014-11-19 ENCOUNTER — Other Ambulatory Visit: Payer: Self-pay | Admitting: *Deleted

## 2014-11-19 NOTE — Patient Outreach (Signed)
[name]) Care Management   11/19/2014  [name] [name] 24, 1947 [id number]  [name] is an 69 y.o. male who lives in [place] with his wife. [name] has DM Type II, CAD, Hyperlipidemia, HTN, and history of CHF. He has been followed by [facility] for over 2 years because of his multiple medical problems, surgeries, and falls. Most recently, [name] underwent back surgery and has progressed well post-operatively.   I am now working with him on Diabetes management. Diabetes management has been challenging for [name] and he admittedly has difficulty adhering to prescribed carb modified diet. Please see care plan for interventions related to diabetes management.   Subjective: "I feel good. I'm ready for my surgery on November 1."  Objective:  BP 108/76 mmHg  Pulse 66  Ht 1.778 m ('5\' 10"' )  Wt 210 lb (95.255 kg)  BMI 30.13 kg/m2  SpO2 94%  Review of Systems  Constitutional: Negative.   HENT: Negative.   Eyes: Negative.   Respiratory: Negative.   Cardiovascular: Positive for leg swelling.       1+ edema RLE (baseline)  Gastrointestinal: Negative.   Genitourinary: Negative.   Musculoskeletal: Positive for myalgias and back pain. Negative for falls.  Skin: Negative.   Neurological: Positive for focal weakness.       Weakness left leg related to lumbar radiculopathy  Psychiatric/Behavioral: Negative.     Physical Exam  Constitutional: He is oriented to person, place, and time. Vital signs are normal. He appears well-developed and well-nourished. He is active.  Cardiovascular: Normal rate, regular rhythm, S1 normal and S2 normal.   Respiratory: Effort normal and breath sounds normal. No respiratory distress.  GI: Soft. Bowel sounds are normal.  Neurological: He is alert and oriented to person, place, and time.  Skin: Skin is warm and dry.  Psychiatric: He has a normal mood and affect. His speech is normal and behavior is normal.  Judgment and thought content normal. Cognition and memory are normal.    Current Medications:   Current Outpatient Prescriptions  Medication Sig Dispense Refill  . allopurinol (ZYLOPRIM) 300 MG tablet take 1 tablet by mouth once daily (Patient taking differently: Take 300 mg by mouth daily. ) 90 tablet 3  . atorvastatin (LIPITOR) 80 MG tablet Take 1 tablet (80 mg total) by mouth daily. 90 tablet 3  . Blood Glucose Monitoring Suppl (ONE TOUCH ULTRA SYSTEM KIT) W/DEVICE KIT 1 kit by Does not apply route once. 1 each 0  . carvedilol (COREG) 25 MG tablet Take 1 tablet (25 mg total) by mouth 2 (two) times daily. 180 tablet 3  . Cholecalciferol (VITAMIN D) 1000 UNITS capsule Take 1,000 Units by mouth daily.     . fluticasone (FLONASE) 50 MCG/ACT nasal spray Place 2 sprays into both nostrils 2 (two) times daily. 48 g 3  . furosemide (LASIX) 80 MG tablet Take 1 tablet (80 mg total) by mouth 2 (two) times daily. 60 tablet 5  . gabapentin (NEURONTIN) 300 MG capsule Take 2 capsules (600 mg total) by mouth 3 (three) times daily. 540 capsule 3  . glipiZIDE (GLUCOTROL XL) 10 MG 24 hr tablet Take 1 tablet (10 mg total) by mouth daily with breakfast. 90 tablet 3  . glucose blood (ONE TOUCH ULTRA TEST) test strip Use to check blood sugar twice a day Dx E11.40 (Patient taking differently: 1 each by Other route See admin instructions. Check blood sugar 3 times daily) 300 each 3  .  HYDROcodone-acetaminophen (NORCO) 10-325 MG per tablet Take 2 tablets by mouth every 6 (six) hours as needed (1-2 tab every 4-6 hours prn).    Marland Kitchen ibuprofen (ADVIL,MOTRIN) 200 MG tablet Take 800 mg by mouth daily as needed for mild pain or moderate pain.     Marland Kitchen lisinopril (PRINIVIL,ZESTRIL) 2.5 MG tablet Take 1 tablet (2.5 mg total) by mouth daily. 90 tablet 3  . metFORMIN (GLUCOPHAGE) 500 MG tablet Take 2 tablets (1,000 mg total) by mouth 2 (two) times daily. 360 tablet 3  . Multiple Vitamin (MULTIVITAMIN WITH MINERALS) TABS tablet Take 1  tablet by mouth every morning.    Marland Kitchen oxyCODONE-acetaminophen (PERCOCET/ROXICET) 5-325 MG per tablet Take 1-2 tablets by mouth every 4 (four) hours as needed for moderate pain. 80 tablet 0  . potassium chloride SA (K-DUR,KLOR-CON) 20 MEQ tablet Take 1 tablet (20 mEq total) by mouth every evening. 90 tablet 3  . sitaGLIPtin (JANUVIA) 100 MG tablet Take 1 tablet (100 mg total) by mouth daily. 90 tablet 3  . tamsulosin (FLOMAX) 0.4 MG CAPS capsule Take 1 capsule (0.4 mg total) by mouth every 12 (twelve) hours. 90 capsule 3  . tiZANidine (ZANAFLEX) 4 MG tablet TAKE 1 TABLET BY MOUTH  EVERY 6 HOURS AS NEEDED FOR MUSCLE SPASMS. 180 tablet 1  . traMADol (ULTRAM) 50 MG tablet Take 1 tablet (50 mg total) by mouth every 12 (twelve) hours as needed for moderate pain or severe pain. 60 tablet 2  . warfarin (COUMADIN) 5 MG tablet Take 1 tablet (5 mg total) by mouth daily. (Patient taking differently: Take 5 mg by mouth daily. Take 2.5 mg by mouth on Sunday, Tuesday, Thursday, Friday and Saturday. Take 63m by mouth Monday and Wednesday.) 90 tablet 3  . HYDROcodone-acetaminophen (NORCO/VICODIN) 5-325 MG per tablet Take 1 tablet by mouth every 4 (four) hours as needed for moderate pain. (Patient not taking: Reported on 11/19/2014) 15 tablet 0   No current facility-administered medications for this visit.    Assessment:    Chronic Health Condition (left hip pain; trochanteric bursitis) - [name]; injections to the left hip/back have provided short term relief; he was using Pennsaid 2% and Tramadol as prescribed and using OTC NSAIDS prn; he presented to the ED on 7/20 and received Hydrocodone which gave him very temporary relief; he had a myelogram on 10/29/14 and has since seen Dr. PTrenton Gammonwho scheduled him for surgery on 11/1. Dr. CStanford Breedcleared Mr. PMossfor surgery. Mr. PAumanwill stop his anticoagulant 1 week before surgery (next Tuesday); his  reported pain level currently is "1-2" while taking prescribed pain medication   Chronic Health Condition (DM) - Mr. PColtranesaw improvement in 08/12/28 day cbg averages during mid July-August but when he began having severe pain, his cbg's worsened (see details below); he was not eating well and was unable to exercise; however he is seeing Dr. PTrenton Gammonand began a new pain regimen yesterday which has enabled him to ambulate freely; he feels that if he has improvement in ability to walk/exercise, he will see improvement in cbg's; his goal is to get cbg's back to averages similar to the July - August period. His greatest area of struggle in Diabetes Management is adherence to prescribed carb modified diet.   July - August 7 day average (n=20) = 133Aug - September 7 day average (n= 18) = 190 July - August 14 day average (n=42) = 139 Aug - September  14 day average (n= 28) = 165 July - August 30 day average (n=89) = 138 Aug - September 30 day average (n= 80) = 160  September - October 7 day average (n=18) = 186 September - October 14 day average (n=32) = 165 September - October 30 day average (n=70) = 158   Mr. Weyenberg told me he had HgA1C drawn last month at his primary care provider's office on 8/20 but results are not available; His last HgA1C drawn in January was 8.1; he is scheduled for labs this Friday and will have HgA1C done then  Chronic Health Condition (CHF) - Mr. Giangrande has not had CHF exacerbation symptoms; he has very mild edema of the right lower extremity today, likely soft tissue swelling and consistent with baseline; Mr. Bartow is taking Lasix 72m BID as prescribed; he does not consistently adhere to a sodium restricted diet  Weights: 11/19/14 = 210# 10/15/14 = 205# 09/16/14 = 210# 08/28/14 = 210# 07/21/14 = 226# 07/15/14 = 228# 06/18/14 = 220#  Chronic Health Condition (HTN) - Mr. PStockingerblood pressure has been in provider recommended range for several months;  he takes medications as prescribed  11/19/14 =  10/15/14 = 104/62 09/16/14 = 110/62 08/19/14 = 102/60 07/21/14 = 124/72 07/15/14 = 138/72 06/18/14 = 102/66  Plan:   I will see Mr. PVantolat home on 11/25/14 for administration of flu vaccine.   I will see Mr. PKingsat home on 12/08/14 for post op home visit.   [facility] Care Plan Problem One        Most Recent Value   Care Plan Problem One  Chronic Health Condition (Radiculopathy),  Surgery 12/01/14   Role Documenting the Problem One  Care Management Coordinator   Care Plan for Problem One  Active   THN Long Term Goal (31-90 days)  Patient will verbalize understanding of post surgical plan of care over the next 31 days   THN Long Term Goal Start Date  11/19/14   Interventions for Problem One Long Term Goal  Utilizing teachback method, reviewed pre-op plans and encouraged patient to make list of questions about post op course to discuss with his provider when he sees him on Friday,  provided EFranklin Resourceseducational materials for review   THN CM Short Term Goal #1 (0-30 days)  Patient will attend all upcoming pre-op appointments as scheduled over the next 10 days   THN CM Short Term Goal #1 Start Date  11/19/14   Interventions for Short Term Goal #1  Utilizing teachback method, reviewed all paperwork and pre-op appointments with patient   THN CM Short Term Goal #2 (0-30 days)  Over the next 14 days, patient will contiunue prescribed anticoagulant until 10/25 when he is to begin holding   THN CM Short Term Goal #2 Start Date  11/19/14   Interventions for Short Term Goal #2  utilizing teachback method and calendar tool, reviewed recommendations/orders to hold anticoagulant x 1 week prior to surgery   THN CM Short Term Goal #3 (0-30 days)  Over the next 11 days patient will use Incentive Spirometer twice daily in preparation for surgery   THN CM Short Term Goal #3 Start Date  11/19/14   Interventions for Short Tern Goal #3  Utilizing teachback method,  reviewed with patient rationale for use of incentive spirometry prior to surgery and how to properly use device    [facility] Problem Two        Most Recent  Value   Care Plan Problem Two  Diabetes Management Concerns   Role Documenting the Problem Two  Care Management Coordinator   Care Plan for Problem Two  Not Active   Interventions for Problem Two Long Term Goal   utilizing teachback method, reviewed with patient provider recommended cbg averages, recommended HgA1C goals   THN Long Term Goal (31-90) days  patient will maintain cbg averages < 150 over the next 31 days as evidenced by glucose monitor readings   THN Long Term Goal Start Date  09/16/14   [facility] Long Term Goal Met Date  10/15/14 [not met,  goal reinstated]   THN CM Short Term Goal #1 (0-30 days)  over the next 30 days patient will ambulate for exercise 5-10 minutes daily x 4 days/week if hip pain permits   THN CM Short Term Goal #1 Start Date  -- [not met - radiculopathy with pending surgery]   THN CM Short Term Goal #1 Met Date   -- [not met,  reset for next 30 days]   Interventions for Short Term Goal #2   utilizing teachback method, reviewed role of exercise in diabetes management,  provided Emmi education last month and reviewed again today   THN CM Short Term Goal #2 (0-30 days)  over the next 30 days, patient will continue to monitor and manage portion sizes as evidenced by patient verbal report and continued CBG averages < 150   THN CM Short Term Goal #2 Start Date  10/15/14   [facility] CM Short Term Goal #2 Met Date  11/19/14 [not met,  reinstated]   Interventions for Short Term Goal #2  utilizing teachback method, reviewed prescribed carb modified diet,  provided emmi education        [facility]  [phone]

## 2014-11-20 NOTE — Addendum Note (Signed)
Addended by: Clerance Lav on: 11/20/2014 10:06 AM   Modules accepted: Orders

## 2014-11-20 NOTE — Addendum Note (Signed)
Addended by: Clerance Lav on: 11/20/2014 10:32 AM   Modules accepted: Orders

## 2014-11-25 NOTE — Pre-Procedure Instructions (Signed)
    [name]  11/25/2014      [id number] [place], [place] [address] [place]  [address] Phone: [phone] Fax: [place], [place] [place] [address] [place] [address] Phone: [phone] Fax: [phone]    Your procedure is scheduled on 12/01/14.  Report to [facility] Admitting at 730 A.M.  Call this number if you have problems the morning of surgery:  [phone]   Remember:  Do not eat food or drink liquids after midnight.  Take these medicines the morning of surgery with A SIP OF WATER --allopurinol,carvedilol,flonase,neurontin,hydrocodone,flomax   Do not wear jewelry, make-up or nail polish.  Do not wear lotions, powders, or perfumes.  You may wear deodorant.  Do not shave 48 hours prior to surgery.  Men may shave face and neck.  Do not bring valuables to the hospital.  [facility] is not responsible for any belongings or valuables.  Contacts, dentures or bridgework may not be worn into surgery.  Leave your suitcase in the car.  After surgery it may be brought to your room.  For patients admitted to the hospital, discharge time will be determined by your treatment team.  Patients discharged the day of surgery will not be allowed to drive home.   Name and phone number of your driver:   Special instructions:   Please read over the following fact sheets that you were given. Pain Booklet, Coughing and Deep Breathing, MRSA Information and Surgical Site Infection Prevention

## 2014-11-26 ENCOUNTER — Encounter (HOSPITAL_COMMUNITY)
Admission: RE | Admit: 2014-11-26 | Discharge: 2014-11-26 | Disposition: A | Discharge status: Home / Self Care | Payer: Medicare Other | Source: Ambulatory Visit | Attending: Neurosurgery | Admitting: Neurosurgery

## 2014-11-26 ENCOUNTER — Encounter (HOSPITAL_COMMUNITY): Payer: Self-pay

## 2014-11-26 DIAGNOSIS — E1142 Type 2 diabetes mellitus with diabetic polyneuropathy: Secondary | ICD-10-CM | POA: Diagnosis not present

## 2014-11-26 DIAGNOSIS — I2510 Atherosclerotic heart disease of native coronary artery without angina pectoris: Secondary | ICD-10-CM | POA: Diagnosis not present

## 2014-11-26 DIAGNOSIS — Z950 Presence of cardiac pacemaker: Secondary | ICD-10-CM | POA: Diagnosis not present

## 2014-11-26 DIAGNOSIS — Z01818 Encounter for other preprocedural examination: Secondary | ICD-10-CM | POA: Insufficient documentation

## 2014-11-26 DIAGNOSIS — I252 Old myocardial infarction: Secondary | ICD-10-CM | POA: Insufficient documentation

## 2014-11-26 DIAGNOSIS — G4733 Obstructive sleep apnea (adult) (pediatric): Secondary | ICD-10-CM | POA: Diagnosis not present

## 2014-11-26 DIAGNOSIS — I5022 Chronic systolic (congestive) heart failure: Secondary | ICD-10-CM | POA: Diagnosis not present

## 2014-11-26 DIAGNOSIS — Z951 Presence of aortocoronary bypass graft: Secondary | ICD-10-CM | POA: Insufficient documentation

## 2014-11-26 DIAGNOSIS — Z8546 Personal history of malignant neoplasm of prostate: Secondary | ICD-10-CM | POA: Insufficient documentation

## 2014-11-26 DIAGNOSIS — Z01812 Encounter for preprocedural laboratory examination: Secondary | ICD-10-CM | POA: Insufficient documentation

## 2014-11-26 DIAGNOSIS — M4806 Spinal stenosis, lumbar region: Secondary | ICD-10-CM | POA: Diagnosis not present

## 2014-11-26 DIAGNOSIS — E785 Hyperlipidemia, unspecified: Secondary | ICD-10-CM | POA: Insufficient documentation

## 2014-11-26 LAB — CBC WITH DIFFERENTIAL/PLATELET
BASOS PCT: 0 %
Basophils Absolute: 0 10*3/uL (ref 0.0–0.1)
EOS ABS: 0.2 10*3/uL (ref 0.0–0.7)
EOS PCT: 2 %
HCT: 34.9 % — ABNORMAL LOW (ref 39.0–52.0)
HEMOGLOBIN: 11.4 g/dL — AB (ref 13.0–17.0)
Lymphocytes Relative: 17 %
Lymphs Abs: 1.4 10*3/uL (ref 0.7–4.0)
MCH: 30.2 pg (ref 26.0–34.0)
MCHC: 32.7 g/dL (ref 30.0–36.0)
MCV: 92.6 fL (ref 78.0–100.0)
MONOS PCT: 8 %
Monocytes Absolute: 0.7 10*3/uL (ref 0.1–1.0)
NEUTROS PCT: 73 %
Neutro Abs: 5.9 10*3/uL (ref 1.7–7.7)
PLATELETS: 142 10*3/uL — AB (ref 150–400)
RBC: 3.77 MIL/uL — ABNORMAL LOW (ref 4.22–5.81)
RDW: 15.8 % — ABNORMAL HIGH (ref 11.5–15.5)
WBC: 8.2 10*3/uL (ref 4.0–10.5)

## 2014-11-26 LAB — APTT: aPTT: 35 seconds (ref 24–37)

## 2014-11-26 LAB — PROTIME-INR
INR: 1.86 — AB (ref 0.00–1.49)
PROTHROMBIN TIME: 21.4 s — AB (ref 11.6–15.2)

## 2014-11-26 LAB — GLUCOSE, CAPILLARY: GLUCOSE-CAPILLARY: 146 mg/dL — AB (ref 65–99)

## 2014-11-26 LAB — BASIC METABOLIC PANEL
Anion gap: 9 (ref 5–15)
BUN: 14 mg/dL (ref 6–20)
CALCIUM: 9.1 mg/dL (ref 8.9–10.3)
CO2: 24 mmol/L (ref 22–32)
CREATININE: 0.79 mg/dL (ref 0.61–1.24)
Chloride: 107 mmol/L (ref 101–111)
GFR calc non Af Amer: >60 mL/min (ref >60)
Glucose, Bld: 163 mg/dL — ABNORMAL HIGH (ref 65–99)
Potassium: 4.7 mmol/L (ref 3.5–5.1)
Sodium: 140 mmol/L (ref 135–145)

## 2014-11-26 LAB — SURGICAL PCR SCREEN
MRSA, PCR: NEGATIVE
Staphylococcus aureus: NEGATIVE

## 2014-11-26 NOTE — Pre-Procedure Instructions (Signed)
    [name]  11/26/2014      [id number] - [place], [place] [address] [place] [address] Phone: [phone] Fax: [place], [place] [address] [place] [address] [place] [address] Phone: [phone] Fax: [phone]    Your procedure is scheduled on 11/30/14.  Report to [facility] Admitting at 530 A.M.  Call this number if you have problems the morning of surgery:  [phone]   Remember:  Do not eat food or drink liquids after midnight.  Take these medicines the morning of surgery with A SIP OF WATER --flonase,neurontin,hydrocodone,flomax,alloupurinol,carvedilol   Do not wear jewelry, make-up or nail polish.  Do not wear lotions, powders, or perfumes.  You may wear deodorant.  Do not shave 48 hours prior to surgery.  Men may shave face and neck.  Do not bring valuables to the hospital.  [facility] is not responsible for any belongings or valuables.  Contacts, dentures or bridgework may not be worn into surgery.  Leave your suitcase in the car.  After surgery it may be brought to your room.  For patients admitted to the hospital, discharge time will be determined by your treatment team.  Patients discharged the day of surgery will not be allowed to drive home.   Name and phone number of your driver:   Special instructions:   Please read over the following fact sheets that you were given. Pain Booklet, Coughing and Deep Breathing, Blood Transfusion Information, MRSA Information and Surgical Site Infection Prevention

## 2014-11-26 NOTE — Progress Notes (Signed)
Pacemaker form faxed to Dr Lovena Le. Clearance by Dr Tami Lin stopped 11/24/14.

## 2014-11-26 NOTE — Progress Notes (Signed)
Anesthesia Chart Review:  Pt is 69 year old male scheduled for laminectomy and foraminotomy left L3-4, L4-5 on 12/01/2014 with Dr. Annette Stable.    PCP is Dr. Cathlean Cower. Cardiologist is Dr. [name]. EP cardiologist is Dr. Lovena Le.   PMH includes: CAD (s/p CABG 1992), MI (2012), CHB (s/p Medtronic PPM), aflutter on warfarin, chronic diastolic CHF, DM2, aortic root dilatation, peripheral neuropathy, prostate cancer, HLD, depression, migraines, OSA. S/p L3-S1 laminectomy and foraminotomy 03/23/14.   Preoperative labs reviewed.  PT 21.4. Pt stopped coumadin 11/24/14.  Will recheck PT DOS.   EKG 4/272016: V-paced rhythm with underlying a-flutter at 88 bpm.  03/11/14 nuclear stress test: Overall Impression: Low risk stress nuclear study with a Moderate fixed apical perfusion defect that is either consistent with prio infarction or V-Paced LBBB pattern. . The absence of ischemia and preserved EF would make this a Low Risk as opposed to Intermediate Risk study. LV Wall Motion: Pacemaker related septal wall motion abnormality. Also noted is apical akinesis. Low normal EF - likely underestimated. EF 52%.  09/10/13 Echo:  - Left ventricle: The cavity size was normal. Wall thickness was increased in a pattern of mild LVH. Systolic function was normal. The estimated ejection fraction was in the range of 55% to 60%. Wall motion was normal; there were no regional wall motion abnormalities. - Left atrium: The atrium was mildly dilated. - Atrial septum: No defect or patent foramen ovale was identified.  Last cardiac catheterization on 06/15/08 showed a normal left main, a 50-70% circumflex, a totally occluded LAD, and a focal 70% within the stent in the mid right coronary artery. The saphenous vein graft to the circumflex was occluded (old). The LIMA to the LAD was patent with patent stents in the mid and distal LAD. Ejection fraction was 60%. Normal LV filling pressures. The RCA lesion was felt  slightly worse than before and probably not flow limiting. Myoview on 06/24/08 showed no significant ischemia and medical therapy was continued.   Normal abdominal aortic aneurysm duplex on 06/24/14.  Dr. Stanford Breed is aware of upcoming surgery and did not recommend lovenox bridge (see Epic telephone encounter dated 11/11/14).   Perioperative prescription form for ICDs still pending.   Pt tolerated similar surgery last February. His cardiologist is aware of upcoming surgery. If no changes, I anticipate pt can proceed with surgery as scheduled.   Willeen Cass, FNP-BC [facility]iology Phone: [phone] 11/26/2014 1:25 PM

## 2014-11-27 ENCOUNTER — Ambulatory Visit (INDEPENDENT_AMBULATORY_CARE_PROVIDER_SITE_OTHER): Payer: Medicare Other | Admitting: Internal Medicine

## 2014-11-27 ENCOUNTER — Encounter: Payer: Self-pay | Admitting: Internal Medicine

## 2014-11-27 VITALS — BP 116/74 | HR 87 | Ht 71.0 in | Wt 224.4 lb

## 2014-11-27 DIAGNOSIS — I5032 Chronic diastolic (congestive) heart failure: Secondary | ICD-10-CM | POA: Diagnosis not present

## 2014-11-27 DIAGNOSIS — I482 Chronic atrial fibrillation, unspecified: Secondary | ICD-10-CM

## 2014-11-27 DIAGNOSIS — I442 Atrioventricular block, complete: Secondary | ICD-10-CM

## 2014-11-27 DIAGNOSIS — I10 Essential (primary) hypertension: Secondary | ICD-10-CM | POA: Diagnosis not present

## 2014-11-27 DIAGNOSIS — Z950 Presence of cardiac pacemaker: Secondary | ICD-10-CM | POA: Diagnosis not present

## 2014-11-27 LAB — CUP PACEART INCLINIC DEVICE CHECK
Brady Statistic AP VP Percent: 10 %
Brady Statistic AS VP Percent: 25 %
Brady Statistic AS VS Percent: 65 %
Implantable Lead Implant Date: 20030730
Implantable Lead Implant Date: 20030730
Implantable Lead Model: 5076
Lead Channel Impedance Value: 463 Ohm
Lead Channel Impedance Value: 470 Ohm
Lead Channel Pacing Threshold Amplitude: 0.75 V
Lead Channel Pacing Threshold Pulse Width: 0.4 ms
Lead Channel Setting Pacing Amplitude: 2.5 V
Lead Channel Setting Sensing Sensitivity: 2.8 mV
MDC IDC LEAD LOCATION: 753859
MDC IDC LEAD LOCATION: 753860
MDC IDC MSMT BATTERY IMPEDANCE: 442 Ohm
MDC IDC MSMT BATTERY REMAINING LONGEVITY: 83 mo
MDC IDC MSMT BATTERY VOLTAGE: 2.77 V
MDC IDC MSMT LEADCHNL RA SENSING INTR AMPL: 5.6 mV — AB
MDC IDC SESS DTM: 20161028103913
MDC IDC SET LEADCHNL RA PACING AMPLITUDE: 2 V
MDC IDC SET LEADCHNL RV PACING PULSEWIDTH: 0.4 ms
MDC IDC STAT BRADY AP VS PERCENT: 0 %

## 2014-11-27 NOTE — Progress Notes (Signed)
HPI [name] returns today for followup. He has a history of complete heart block, status post permanent pacemaker insertion, hypertension, and dyslipidemia, and coronary artery disease, status post multiple stents and status post bypass surgery. The patient has a history of dyspnea. He has been followed by Dr. Stanford Breed. He denies chest pain or peripheral edema. He is pending back surgery. Allergies  Allergen Reactions  . Crestor [Rosuvastatin Calcium] Other (See Comments)    Urination of blood     Current Outpatient Prescriptions  Medication Sig Dispense Refill  . allopurinol (ZYLOPRIM) 300 MG tablet take 1 tablet by mouth once daily (Patient taking differently: Take 300 mg by mouth daily. ) 90 tablet 3  . atorvastatin (LIPITOR) 80 MG tablet Take 1 tablet (80 mg total) by mouth daily. 90 tablet 3  . Blood Glucose Monitoring Suppl (ONE TOUCH ULTRA SYSTEM KIT) W/DEVICE KIT 1 kit by Does not apply route once. 1 each 0  . carvedilol (COREG) 25 MG tablet Take 1 tablet (25 mg total) by mouth 2 (two) times daily. 180 tablet 3  . Cholecalciferol (VITAMIN D) 1000 UNITS capsule Take 1,000 Units by mouth daily.     . fluticasone (FLONASE) 50 MCG/ACT nasal spray Place 2 sprays into both nostrils 2 (two) times daily. 48 g 3  . furosemide (LASIX) 80 MG tablet Take 1 tablet (80 mg total) by mouth 2 (two) times daily. 60 tablet 5  . gabapentin (NEURONTIN) 300 MG capsule Take 2 capsules (600 mg total) by mouth 3 (three) times daily. 540 capsule 3  . glipiZIDE (GLUCOTROL XL) 10 MG 24 hr tablet Take 1 tablet (10 mg total) by mouth daily with breakfast. 90 tablet 3  . glucose blood (ONE TOUCH ULTRA TEST) test strip Use to check blood sugar twice a day Dx E11.40 (Patient taking differently: 1 each by Other route See admin instructions. Check blood sugar 3 times daily) 300 each 3  . HYDROcodone-acetaminophen (NORCO) 10-325 MG per tablet Take 2 tablets by mouth every 6 (six) hours as needed.     Marland Kitchen  HYDROcodone-acetaminophen (NORCO/VICODIN) 5-325 MG per tablet Take 1 tablet by mouth every 4 (four) hours as needed for moderate pain. 15 tablet 0  . ibuprofen (ADVIL,MOTRIN) 200 MG tablet Take 800 mg by mouth daily as needed for mild pain or moderate pain.     Marland Kitchen lisinopril (PRINIVIL,ZESTRIL) 2.5 MG tablet Take 1 tablet (2.5 mg total) by mouth daily. 90 tablet 3  . metFORMIN (GLUCOPHAGE) 500 MG tablet Take 2 tablets (1,000 mg total) by mouth 2 (two) times daily. 360 tablet 3  . Multiple Vitamin (MULTIVITAMIN WITH MINERALS) TABS tablet Take 1 tablet by mouth every morning.    Marland Kitchen oxyCODONE-acetaminophen (PERCOCET/ROXICET) 5-325 MG per tablet Take 1-2 tablets by mouth every 4 (four) hours as needed for moderate pain. 80 tablet 0  . potassium chloride SA (K-DUR,KLOR-CON) 20 MEQ tablet Take 1 tablet (20 mEq total) by mouth every evening. 90 tablet 3  . sitaGLIPtin (JANUVIA) 100 MG tablet Take 1 tablet (100 mg total) by mouth daily. 90 tablet 3  . tamsulosin (FLOMAX) 0.4 MG CAPS capsule Take 1 capsule (0.4 mg total) by mouth every 12 (twelve) hours. 90 capsule 3  . tiZANidine (ZANAFLEX) 4 MG tablet TAKE 1 TABLET BY MOUTH  EVERY 6 HOURS AS NEEDED FOR MUSCLE SPASMS. 180 tablet 1  . traMADol (ULTRAM) 50 MG tablet Take 1 tablet (50 mg total) by mouth every 12 (twelve) hours as needed for moderate pain or severe  pain. 60 tablet 2  . warfarin (COUMADIN) 5 MG tablet Take 1 tablet (5 mg total) by mouth daily. (Patient taking differently: Take 5 mg by mouth daily. Take 2.5 mg by mouth on _0 yrs ago  . DIABETES MELLITUS, TYPE II 08/29/2006    takes Metformin,Januvia,and Glipizide  daily  . CORONARY ARTERY DISEASE 08/29/2006    takes Coumadin daily  . 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 Peripheral neuropathy (HCC)     takes Gabapentin daily  . Chronic back pain     HNP   . Cataracts, bilateral     immature  . Presence of permanent cardiac pacemaker     ROS:   All systems reviewed and negative except as noted in the HPI.   Past Surgical History  Procedure Laterality Date  . S/p left arm surgury after work accident  1991    "2000# steel fell on it"  . S/p right hand surgury for foreign object  1970's    "piece of wood went in my hand; had to get that out"  . Insert / replace / remove pacemaker  ~ 2004    initial pacemaker placement  . Insert / replace / remove pacemaker  10/2009    generator change  . Colonoscopy    . Esophagogastroduodenoscopy N/A 12/01/2013    Procedure: ESOPHAGOGASTRODUODENOSCOPY (EGD);  Surgeon: Lafayette Dragon, MD;  Location: [facility];  Service: Endoscopy;  Laterality: N/A;  . Coronary artery bypass graft  1992    CABG X 2  . Coronary angioplasty with  stent placement  12/27/10    "I've had a total of 9 cardiac stents put in"  . Lumbar laminectomy/decompression microdiscectomy Right 03/23/2014    Procedure: LAMINECTOMY AND FORAMINOTOMY RIGHT LUMBAR THREE-FOUR,LUMBAR FOUR-FIVE, LUMBAR FIVE-SACRAL ONE;  Surgeon: Charlie Pitter, MD;  Location: [facility];  Service: Neurosurgery;  Laterality: Right;  right     Family History  Problem Relation Age of Onset  . Diabetes Mother   . Diabetes Sister   . Heart disease Sister     2 sister died with heart disease  . Coronary artery disease  Other 38    male, first degree relative  . Diabetes Other     1st degree relative  . Heart disease Sister   . Lung cancer Sister     deceased     Social History   Social History  . Marital Status: Married    Spouse Name: N/A  . Number of Children: 2  . Years of Education: N/A   Occupational History  . prior work Engineer, building services   . disabled since 2004    Social History Main Topics  . Smoking status: Former Smoker -- 3.00 packs/day for 9 years    Types: Cigarettes    Quit date: 02/26/1973  . Smokeless tobacco: Former Systems developer     Comment: quit smoking 52yr ago  . Alcohol Use: No  . Drug Use: No     Comment: "used pouches of tobacco for a long time; quit those 12/09/1970"  . Sexual Activity: Yes   Other Topics Concern  . Not on file   Social History Narrative     BP 116/74 mmHg  Pulse 87  Ht _0  (1.803 m)  Wt 224 lb 6.4 oz (101.787 kg)  BMI 31.31 kg/m2  SpO2 93%  Physical Exam:  Well appearing 69year old man, NAD HEENT: Unremarkable Neck:  No JVD, no thyromegally Back:  No CVA tenderness Lungs:  Clear with no wheezes, rales, or rhonchi. HEART:  Regular rate rhythm, no murmurs, no rubs, no clicks Abd:  soft, positive bowel sounds, no organomegally, no rebound, no guarding Ext:  2 plus pulses, no edema, no cyanosis, no clubbing Skin:  No rashes no nodules Neuro:  CN II through XII intact, motor grossly intact   DEVICE   Normal device function.  See PaceArt for details.   Assess/Plan:

## 2014-11-27 NOTE — Assessment & Plan Note (Signed)
His blood pressure is well controlled. He will continue his current medications and maintain a low-sodium diet. 

## 2014-11-27 NOTE — Assessment & Plan Note (Signed)
His symptoms are class II. He will continue his current medications and maintain a low-sodium diet. 

## 2014-11-27 NOTE — Assessment & Plan Note (Signed)
His Medtronic dual-chamber pacemaker is reprogrammed today from the DDD mode to the DDIR mode with rate response on.

## 2014-11-27 NOTE — Assessment & Plan Note (Signed)
His ventricular rate is well controlled. He will continue his current medications.

## 2014-11-27 NOTE — Progress Notes (Signed)
Called device clinic to check on orders.  They stated that they never received them and to re fax to a different number...[phone].  Attempted X2 to send to that number without success.  Pre-admit secretary will continue to try and fax orders.

## 2014-11-27 NOTE — Patient Instructions (Signed)
Your physician wants you to follow-up in: 12 Months with Dr. Lovena Le. You will receive a reminder letter in the mail two months in advance. If you don't receive a letter, please call our office to schedule the follow-up appointment.  Your physician recommends that you schedule a follow-up appointment in: 6 Months in the [facility].   Your physician recommends that you continue on your current medications as directed. Please refer to the Current Medication list given to you today.  If you need a refill on your cardiac medications before your next appointment, please call your pharmacy.  Thank you for choosing [facility]!

## 2014-11-30 MED ORDER — CEFAZOLIN SODIUM-DEXTROSE 2-3 GM-% IV SOLR
2.0000 g | INTRAVENOUS | Status: AC
  Administered 2014-12-01: 2 g via INTRAVENOUS
  Filled 2014-11-30: qty 50

## 2014-11-30 MED ORDER — DEXAMETHASONE SODIUM PHOSPHATE 10 MG/ML IJ SOLN
10.0000 mg | INTRAMUSCULAR | Status: AC
  Administered 2014-12-01: 10 mg via INTRAVENOUS
  Filled 2014-11-30: qty 1

## 2014-12-01 ENCOUNTER — Ambulatory Visit (HOSPITAL_COMMUNITY): Payer: Medicare Other | Admitting: Anesthesiology

## 2014-12-01 ENCOUNTER — Encounter: Payer: Self-pay | Admitting: *Deleted

## 2014-12-01 ENCOUNTER — Encounter (HOSPITAL_COMMUNITY): Payer: Self-pay | Admitting: Certified Registered Nurse Anesthetist

## 2014-12-01 ENCOUNTER — Observation Stay (HOSPITAL_COMMUNITY)
Admission: RE | Admit: 2014-12-01 | Discharge: 2014-12-02 | Disposition: A | Discharge status: Home / Self Care | Payer: Medicare Other | Source: Ambulatory Visit | Attending: Neurosurgery | Admitting: Neurosurgery

## 2014-12-01 ENCOUNTER — Ambulatory Visit (HOSPITAL_COMMUNITY): Payer: Medicare Other

## 2014-12-01 ENCOUNTER — Encounter (HOSPITAL_COMMUNITY)
Admission: RE | Disposition: A | Discharge status: Home / Self Care | Payer: Self-pay | Source: Ambulatory Visit | Attending: Neurosurgery

## 2014-12-01 ENCOUNTER — Ambulatory Visit (HOSPITAL_COMMUNITY): Payer: Medicare Other | Admitting: Emergency Medicine

## 2014-12-01 DIAGNOSIS — I2510 Atherosclerotic heart disease of native coronary artery without angina pectoris: Secondary | ICD-10-CM | POA: Diagnosis not present

## 2014-12-01 DIAGNOSIS — E1140 Type 2 diabetes mellitus with diabetic neuropathy, unspecified: Secondary | ICD-10-CM | POA: Insufficient documentation

## 2014-12-01 DIAGNOSIS — Z950 Presence of cardiac pacemaker: Secondary | ICD-10-CM | POA: Diagnosis not present

## 2014-12-01 DIAGNOSIS — Z9889 Other specified postprocedural states: Secondary | ICD-10-CM | POA: Diagnosis not present

## 2014-12-01 DIAGNOSIS — M47816 Spondylosis without myelopathy or radiculopathy, lumbar region: Principal | ICD-10-CM | POA: Insufficient documentation

## 2014-12-01 DIAGNOSIS — M4806 Spinal stenosis, lumbar region: Secondary | ICD-10-CM | POA: Diagnosis not present

## 2014-12-01 DIAGNOSIS — Z87891 Personal history of nicotine dependence: Secondary | ICD-10-CM | POA: Insufficient documentation

## 2014-12-01 DIAGNOSIS — I5032 Chronic diastolic (congestive) heart failure: Secondary | ICD-10-CM | POA: Insufficient documentation

## 2014-12-01 DIAGNOSIS — N400 Enlarged prostate without lower urinary tract symptoms: Secondary | ICD-10-CM | POA: Insufficient documentation

## 2014-12-01 DIAGNOSIS — Z7901 Long term (current) use of anticoagulants: Secondary | ICD-10-CM | POA: Insufficient documentation

## 2014-12-01 DIAGNOSIS — I252 Old myocardial infarction: Secondary | ICD-10-CM | POA: Insufficient documentation

## 2014-12-01 DIAGNOSIS — E785 Hyperlipidemia, unspecified: Secondary | ICD-10-CM | POA: Diagnosis not present

## 2014-12-01 DIAGNOSIS — Z951 Presence of aortocoronary bypass graft: Secondary | ICD-10-CM | POA: Insufficient documentation

## 2014-12-01 DIAGNOSIS — M4306 Spondylolysis, lumbar region: Secondary | ICD-10-CM | POA: Diagnosis not present

## 2014-12-01 DIAGNOSIS — Z7984 Long term (current) use of oral hypoglycemic drugs: Secondary | ICD-10-CM | POA: Insufficient documentation

## 2014-12-01 DIAGNOSIS — M48061 Spinal stenosis, lumbar region without neurogenic claudication: Secondary | ICD-10-CM | POA: Diagnosis present

## 2014-12-01 DIAGNOSIS — M48062 Spinal stenosis, lumbar region with neurogenic claudication: Secondary | ICD-10-CM | POA: Diagnosis present

## 2014-12-01 DIAGNOSIS — I10 Essential (primary) hypertension: Secondary | ICD-10-CM | POA: Diagnosis not present

## 2014-12-01 DIAGNOSIS — Z419 Encounter for procedure for purposes other than remedying health state, unspecified: Secondary | ICD-10-CM

## 2014-12-01 HISTORY — PX: LUMBAR LAMINECTOMY/DECOMPRESSION MICRODISCECTOMY: SHX5026

## 2014-12-01 LAB — GLUCOSE, CAPILLARY
GLUCOSE-CAPILLARY: 177 mg/dL — AB (ref 65–99)
GLUCOSE-CAPILLARY: 252 mg/dL — AB (ref 65–99)
GLUCOSE-CAPILLARY: 235 mg/dL — AB (ref 65–99)
Glucose-Capillary: 160 mg/dL — ABNORMAL HIGH (ref 65–99)
Glucose-Capillary: 170 mg/dL — ABNORMAL HIGH (ref 65–99)

## 2014-12-01 LAB — PROTIME-INR
INR: 1.16 (ref 0.00–1.49)
PROTHROMBIN TIME: 15 s (ref 11.6–15.2)

## 2014-12-01 SURGERY — LUMBAR LAMINECTOMY/DECOMPRESSION MICRODISCECTOMY 2 LEVELS
Anesthesia: General | Site: Spine Lumbar | Laterality: Left

## 2014-12-01 MED ORDER — TIZANIDINE HCL 4 MG PO TABS
4.0000 mg | ORAL_TABLET | Freq: Four times a day (QID) | ORAL | Status: DC | PRN
  Filled 2014-12-01: qty 1

## 2014-12-01 MED ORDER — MIDAZOLAM HCL 5 MG/5ML IJ SOLN
INTRAMUSCULAR | Status: DC | PRN
  Administered 2014-12-01: 2 mg via INTRAVENOUS

## 2014-12-01 MED ORDER — CYCLOBENZAPRINE HCL 10 MG PO TABS
10.0000 mg | ORAL_TABLET | Freq: Three times a day (TID) | ORAL | Status: DC | PRN
  Administered 2014-12-01 – 2014-12-02 (×2): 10 mg via ORAL
  Filled 2014-12-01 (×2): qty 1

## 2014-12-01 MED ORDER — HEMOSTATIC AGENTS (NO CHARGE) OPTIME
TOPICAL | Status: DC | PRN
  Administered 2014-12-01: 1 via TOPICAL

## 2014-12-01 MED ORDER — FLUTICASONE PROPIONATE 50 MCG/ACT NA SUSP
2.0000 | Freq: Two times a day (BID) | NASAL | Status: DC
  Administered 2014-12-02: 2 via NASAL
  Filled 2014-12-01: qty 16

## 2014-12-01 MED ORDER — ADULT MULTIVITAMIN W/MINERALS CH
1.0000 | ORAL_TABLET | Freq: Every morning | ORAL | Status: DC
  Administered 2014-12-02: 1 via ORAL
  Filled 2014-12-01: qty 1

## 2014-12-01 MED ORDER — ACETAMINOPHEN 10 MG/ML IV SOLN
INTRAVENOUS | Status: DC | PRN
  Administered 2014-12-01: 1000 mg via INTRAVENOUS

## 2014-12-01 MED ORDER — ONDANSETRON HCL 4 MG/2ML IJ SOLN
INTRAMUSCULAR | Status: DC | PRN
  Administered 2014-12-01: 4 mg via INTRAVENOUS

## 2014-12-01 MED ORDER — SODIUM CHLORIDE 0.9 % IJ SOLN
3.0000 mL | Freq: Two times a day (BID) | INTRAMUSCULAR | Status: DC
  Administered 2014-12-01: 3 mL via INTRAVENOUS

## 2014-12-01 MED ORDER — PHENYLEPHRINE 40 MCG/ML (10ML) SYRINGE FOR IV PUSH (FOR BLOOD PRESSURE SUPPORT)
PREFILLED_SYRINGE | INTRAVENOUS | Status: AC
  Filled 2014-12-01: qty 10

## 2014-12-01 MED ORDER — POTASSIUM CHLORIDE CRYS ER 20 MEQ PO TBCR
20.0000 meq | EXTENDED_RELEASE_TABLET | Freq: Every evening | ORAL | Status: DC
  Administered 2014-12-01: 20 meq via ORAL
  Filled 2014-12-01 (×2): qty 1

## 2014-12-01 MED ORDER — ATORVASTATIN CALCIUM 20 MG PO TABS
80.0000 mg | ORAL_TABLET | Freq: Every day | ORAL | Status: DC
  Administered 2014-12-01: 80 mg via ORAL
  Filled 2014-12-01: qty 4

## 2014-12-01 MED ORDER — FENTANYL CITRATE (PF) 250 MCG/5ML IJ SOLN
INTRAMUSCULAR | Status: AC
  Filled 2014-12-01: qty 5

## 2014-12-01 MED ORDER — FUROSEMIDE 40 MG PO TABS
80.0000 mg | ORAL_TABLET | Freq: Two times a day (BID) | ORAL | Status: DC
  Administered 2014-12-01 – 2014-12-02 (×2): 80 mg via ORAL
  Filled 2014-12-01 (×2): qty 2

## 2014-12-01 MED ORDER — SODIUM CHLORIDE 0.9 % IJ SOLN: 3.0000 mL | INTRAMUSCULAR | Status: DC | PRN

## 2014-12-01 MED ORDER — ARTIFICIAL TEARS OP OINT
TOPICAL_OINTMENT | OPHTHALMIC | Status: AC
  Filled 2014-12-01: qty 3.5

## 2014-12-01 MED ORDER — MENTHOL 3 MG MT LOZG: 1.0000 | LOZENGE | OROMUCOSAL | Status: DC | PRN

## 2014-12-01 MED ORDER — ACETAMINOPHEN 650 MG RE SUPP: 650.0000 mg | RECTAL | Status: DC | PRN

## 2014-12-01 MED ORDER — FENTANYL CITRATE (PF) 100 MCG/2ML IJ SOLN
INTRAMUSCULAR | Status: DC | PRN
  Administered 2014-12-01: 100 ug via INTRAVENOUS

## 2014-12-01 MED ORDER — VANCOMYCIN HCL 1000 MG IV SOLR
INTRAVENOUS | Status: DC | PRN
  Administered 2014-12-01: 1000 mg

## 2014-12-01 MED ORDER — ALBUMIN HUMAN 5 % IV SOLN
INTRAVENOUS | Status: DC | PRN
  Administered 2014-12-01: 11:00:00 via INTRAVENOUS

## 2014-12-01 MED ORDER — GABAPENTIN 300 MG PO CAPS
600.0000 mg | ORAL_CAPSULE | Freq: Three times a day (TID) | ORAL | Status: DC
  Administered 2014-12-01 – 2014-12-02 (×2): 600 mg via ORAL
  Filled 2014-12-01 (×2): qty 2

## 2014-12-01 MED ORDER — METFORMIN HCL 500 MG PO TABS
1000.0000 mg | ORAL_TABLET | Freq: Two times a day (BID) | ORAL | Status: DC
  Administered 2014-12-01 – 2014-12-02 (×2): 1000 mg via ORAL
  Filled 2014-12-01 (×2): qty 2

## 2014-12-01 MED ORDER — GLIPIZIDE ER 10 MG PO TB24
10.0000 mg | ORAL_TABLET | Freq: Every day | ORAL | Status: DC
  Administered 2014-12-02: 10 mg via ORAL
  Filled 2014-12-01 (×2): qty 1

## 2014-12-01 MED ORDER — NEOSTIGMINE METHYLSULFATE 10 MG/10ML IV SOLN
INTRAVENOUS | Status: AC
  Filled 2014-12-01: qty 1

## 2014-12-01 MED ORDER — PROPOFOL 10 MG/ML IV BOLUS
INTRAVENOUS | Status: AC
  Filled 2014-12-01: qty 20

## 2014-12-01 MED ORDER — VITAMIN D 1000 UNITS PO TABS
1000.0000 [IU] | ORAL_TABLET | Freq: Every day | ORAL | Status: DC
  Administered 2014-12-02: 1000 [IU] via ORAL
  Filled 2014-12-01: qty 1

## 2014-12-01 MED ORDER — INSULIN ASPART 100 UNIT/ML ~~LOC~~ SOLN
0.0000 [IU] | Freq: Three times a day (TID) | SUBCUTANEOUS | Status: DC
  Administered 2014-12-01: 11 [IU] via SUBCUTANEOUS

## 2014-12-01 MED ORDER — INSULIN ASPART 100 UNIT/ML ~~LOC~~ SOLN
0.0000 [IU] | Freq: Three times a day (TID) | SUBCUTANEOUS | Status: DC
  Administered 2014-12-02: 3 [IU] via SUBCUTANEOUS

## 2014-12-01 MED ORDER — CARVEDILOL 6.25 MG PO TABS
25.0000 mg | ORAL_TABLET | Freq: Two times a day (BID) | ORAL | Status: DC
  Administered 2014-12-01 – 2014-12-02 (×2): 25 mg via ORAL
  Filled 2014-12-01 (×2): qty 4

## 2014-12-01 MED ORDER — EPHEDRINE SULFATE 50 MG/ML IJ SOLN
INTRAMUSCULAR | Status: AC
  Filled 2014-12-01: qty 1

## 2014-12-01 MED ORDER — 0.9 % SODIUM CHLORIDE (POUR BTL) OPTIME
TOPICAL | Status: DC | PRN
  Administered 2014-12-01: 1000 mL

## 2014-12-01 MED ORDER — HYDROMORPHONE HCL 1 MG/ML IJ SOLN
INTRAMUSCULAR | Status: AC
  Filled 2014-12-01: qty 1

## 2014-12-01 MED ORDER — OXYCODONE-ACETAMINOPHEN 5-325 MG PO TABS
1.0000 | ORAL_TABLET | ORAL | Status: DC | PRN
  Administered 2014-12-01 – 2014-12-02 (×4): 2 via ORAL
  Filled 2014-12-01 (×4): qty 2

## 2014-12-01 MED ORDER — INSULIN ASPART 100 UNIT/ML ~~LOC~~ SOLN
0.0000 [IU] | Freq: Every day | SUBCUTANEOUS | Status: DC
  Administered 2014-12-01: 2 [IU] via SUBCUTANEOUS

## 2014-12-01 MED ORDER — SODIUM CHLORIDE 0.9 % IV SOLN: 250.0000 mL | INTRAVENOUS | Status: DC

## 2014-12-01 MED ORDER — MIDAZOLAM HCL 2 MG/2ML IJ SOLN
INTRAMUSCULAR | Status: AC
  Filled 2014-12-01: qty 4

## 2014-12-01 MED ORDER — GLYCOPYRROLATE 0.2 MG/ML IJ SOLN
INTRAMUSCULAR | Status: AC
  Filled 2014-12-01: qty 3

## 2014-12-01 MED ORDER — ROCURONIUM BROMIDE 50 MG/5ML IV SOLN
INTRAVENOUS | Status: AC
  Filled 2014-12-01: qty 1

## 2014-12-01 MED ORDER — PROMETHAZINE HCL 25 MG/ML IJ SOLN: 6.2500 mg | INTRAMUSCULAR | Status: DC | PRN

## 2014-12-01 MED ORDER — VITAMIN D 1000 UNITS PO CAPS: 1000.0000 [IU] | ORAL_CAPSULE | Freq: Every day | ORAL | Status: DC

## 2014-12-01 MED ORDER — SODIUM CHLORIDE 0.9 % IJ SOLN
INTRAMUSCULAR | Status: AC
  Filled 2014-12-01: qty 10

## 2014-12-01 MED ORDER — ONDANSETRON HCL 4 MG/2ML IJ SOLN
INTRAMUSCULAR | Status: AC
  Filled 2014-12-01: qty 2

## 2014-12-01 MED ORDER — GLYCOPYRROLATE 0.2 MG/ML IJ SOLN
INTRAMUSCULAR | Status: DC | PRN
  Administered 2014-12-01: 0.6 mg via INTRAVENOUS

## 2014-12-01 MED ORDER — THROMBIN 5000 UNITS EX SOLR
CUTANEOUS | Status: DC | PRN
  Administered 2014-12-01 (×2): 5000 [IU] via TOPICAL

## 2014-12-01 MED ORDER — PROPOFOL 10 MG/ML IV BOLUS
INTRAVENOUS | Status: DC | PRN
  Administered 2014-12-01: 150 mg via INTRAVENOUS

## 2014-12-01 MED ORDER — LINAGLIPTIN 5 MG PO TABS
5.0000 mg | ORAL_TABLET | Freq: Every day | ORAL | Status: DC
  Administered 2014-12-01 – 2014-12-02 (×2): 5 mg via ORAL
  Filled 2014-12-01 (×2): qty 1

## 2014-12-01 MED ORDER — ROCURONIUM BROMIDE 100 MG/10ML IV SOLN
INTRAVENOUS | Status: DC | PRN
  Administered 2014-12-01: 40 mg via INTRAVENOUS

## 2014-12-01 MED ORDER — LACTATED RINGERS IV SOLN
INTRAVENOUS | Status: DC
  Administered 2014-12-01 (×2): via INTRAVENOUS

## 2014-12-01 MED ORDER — LISINOPRIL 2.5 MG PO TABS
2.5000 mg | ORAL_TABLET | Freq: Every day | ORAL | Status: DC
  Administered 2014-12-01 – 2014-12-02 (×2): 2.5 mg via ORAL
  Filled 2014-12-01 (×2): qty 1

## 2014-12-01 MED ORDER — SODIUM CHLORIDE 0.9 % IR SOLN
Status: DC | PRN
  Administered 2014-12-01: 11:00:00

## 2014-12-01 MED ORDER — HYDROMORPHONE HCL 1 MG/ML IJ SOLN
0.5000 mg | INTRAMUSCULAR | Status: DC | PRN
  Administered 2014-12-01 – 2014-12-02 (×2): 1 mg via INTRAVENOUS
  Filled 2014-12-01 (×2): qty 1

## 2014-12-01 MED ORDER — NEOSTIGMINE METHYLSULFATE 10 MG/10ML IV SOLN
INTRAVENOUS | Status: DC | PRN
  Administered 2014-12-01: 4 mg via INTRAVENOUS

## 2014-12-01 MED ORDER — ONDANSETRON HCL 4 MG/2ML IJ SOLN: 4.0000 mg | INTRAMUSCULAR | Status: DC | PRN

## 2014-12-01 MED ORDER — TAMSULOSIN HCL 0.4 MG PO CAPS
0.4000 mg | ORAL_CAPSULE | Freq: Two times a day (BID) | ORAL | Status: DC
  Administered 2014-12-01 – 2014-12-02 (×2): 0.4 mg via ORAL
  Filled 2014-12-01 (×2): qty 1

## 2014-12-01 MED ORDER — CEFAZOLIN SODIUM 1-5 GM-% IV SOLN
1.0000 g | Freq: Three times a day (TID) | INTRAVENOUS | Status: AC
  Administered 2014-12-01 – 2014-12-02 (×2): 1 g via INTRAVENOUS
  Filled 2014-12-01 (×2): qty 50

## 2014-12-01 MED ORDER — ACETAMINOPHEN 325 MG PO TABS
650.0000 mg | ORAL_TABLET | ORAL | Status: DC | PRN
Start: 2014-12-01 — End: 2014-12-02

## 2014-12-01 MED ORDER — HYDROCODONE-ACETAMINOPHEN 5-325 MG PO TABS: 1.0000 | ORAL_TABLET | ORAL | Status: DC | PRN

## 2014-12-01 MED ORDER — PHENOL 1.4 % MT LIQD: 1.0000 | OROMUCOSAL | Status: DC | PRN

## 2014-12-01 MED ORDER — LIDOCAINE HCL (CARDIAC) 20 MG/ML IV SOLN
INTRAVENOUS | Status: AC
  Filled 2014-12-01: qty 5

## 2014-12-01 MED ORDER — SUCCINYLCHOLINE CHLORIDE 20 MG/ML IJ SOLN
INTRAMUSCULAR | Status: AC
  Filled 2014-12-01: qty 1

## 2014-12-01 MED ORDER — ARTIFICIAL TEARS OP OINT
TOPICAL_OINTMENT | OPHTHALMIC | Status: DC | PRN
  Administered 2014-12-01: 1 via OPHTHALMIC

## 2014-12-01 MED ORDER — ALLOPURINOL 300 MG PO TABS
300.0000 mg | ORAL_TABLET | Freq: Every day | ORAL | Status: DC
  Administered 2014-12-02: 300 mg via ORAL
  Filled 2014-12-01: qty 1

## 2014-12-01 MED ORDER — VANCOMYCIN HCL 1000 MG IV SOLR
INTRAVENOUS | Status: AC
  Filled 2014-12-01: qty 1000

## 2014-12-01 MED ORDER — HYDROMORPHONE HCL 1 MG/ML IJ SOLN
0.2500 mg | INTRAMUSCULAR | Status: DC | PRN
  Administered 2014-12-01: 0.5 mg via INTRAVENOUS

## 2014-12-01 MED ORDER — ACETAMINOPHEN 10 MG/ML IV SOLN
INTRAVENOUS | Status: AC
  Filled 2014-12-01: qty 100

## 2014-12-01 MED ORDER — LIDOCAINE HCL (CARDIAC) 20 MG/ML IV SOLN
INTRAVENOUS | Status: DC | PRN
  Administered 2014-12-01: 60 mg via INTRAVENOUS

## 2014-12-01 SURGICAL SUPPLY — 46 items
APL SKNCLS STERI-STRIP NONHPOA (GAUZE/BANDAGES/DRESSINGS) ×1
BAG DECANTER FOR FLEXI CONT (MISCELLANEOUS) ×3 IMPLANT
BENZOIN TINCTURE PRP APPL 2/3 (GAUZE/BANDAGES/DRESSINGS) ×3 IMPLANT
BLADE CLIPPER SURG (BLADE) IMPLANT
BRUSH SCRUB EZ PLAIN DRY (MISCELLANEOUS) ×3 IMPLANT
BUR CUTTER 7.0 ROUND (BURR) ×3 IMPLANT
CANISTER SUCT 3000ML PPV (MISCELLANEOUS) ×3 IMPLANT
CLOSURE WOUND 1/2 X4 (GAUZE/BANDAGES/DRESSINGS) ×1
DECANTER SPIKE VIAL GLASS SM (MISCELLANEOUS) ×3 IMPLANT
DRAPE LAPAROTOMY 100X72X124 (DRAPES) ×3 IMPLANT
DRAPE MICROSCOPE LEICA (MISCELLANEOUS) ×3 IMPLANT
DRAPE POUCH INSTRU U-SHP 10X18 (DRAPES) ×3 IMPLANT
DRAPE PROXIMA HALF (DRAPES) IMPLANT
DRAPE SURG 17X23 STRL (DRAPES) ×6 IMPLANT
DURAPREP 26ML APPLICATOR (WOUND CARE) ×3 IMPLANT
ELECT REM PT RETURN 9FT ADLT (ELECTROSURGICAL) ×3
ELECTRODE REM PT RTRN 9FT ADLT (ELECTROSURGICAL) ×1 IMPLANT
GAUZE SPONGE 4X4 12PLY STRL (GAUZE/BANDAGES/DRESSINGS) ×3 IMPLANT
GAUZE SPONGE 4X4 16PLY XRAY LF (GAUZE/BANDAGES/DRESSINGS) IMPLANT
GLOVE ECLIPSE 9.0 STRL (GLOVE) ×3 IMPLANT
GLOVE EXAM NITRILE LRG STRL (GLOVE) IMPLANT
GLOVE EXAM NITRILE MD LF STRL (GLOVE) IMPLANT
GLOVE EXAM NITRILE XL STR (GLOVE) IMPLANT
GLOVE EXAM NITRILE XS STR PU (GLOVE) IMPLANT
GOWN STRL REUS W/ TWL LRG LVL3 (GOWN DISPOSABLE) IMPLANT
GOWN STRL REUS W/ TWL XL LVL3 (GOWN DISPOSABLE) ×1 IMPLANT
GOWN STRL REUS W/TWL 2XL LVL3 (GOWN DISPOSABLE) IMPLANT
GOWN STRL REUS W/TWL LRG LVL3 (GOWN DISPOSABLE)
GOWN STRL REUS W/TWL XL LVL3 (GOWN DISPOSABLE) ×3
KIT BASIN OR (CUSTOM PROCEDURE TRAY) ×3 IMPLANT
KIT ROOM TURNOVER OR (KITS) ×3 IMPLANT
LIQUID BAND (GAUZE/BANDAGES/DRESSINGS) ×3 IMPLANT
NDL SPNL 22GX3.5 QUINCKE BK (NEEDLE) ×1 IMPLANT
NEEDLE HYPO 22GX1.5 SAFETY (NEEDLE) ×3 IMPLANT
NEEDLE SPNL 22GX3.5 QUINCKE BK (NEEDLE) ×3 IMPLANT
NS IRRIG 1000ML POUR BTL (IV SOLUTION) ×3 IMPLANT
PACK LAMINECTOMY NEURO (CUSTOM PROCEDURE TRAY) ×3 IMPLANT
PAD ARMBOARD 7.5X6 YLW CONV (MISCELLANEOUS) ×9 IMPLANT
RUBBERBAND STERILE (MISCELLANEOUS) ×6 IMPLANT
SPONGE SURGIFOAM ABS GEL SZ50 (HEMOSTASIS) ×3 IMPLANT
STRIP CLOSURE SKIN 1/2X4 (GAUZE/BANDAGES/DRESSINGS) ×2 IMPLANT
SUT VIC AB 2-0 CT1 18 (SUTURE) ×3 IMPLANT
SUT VIC AB 3-0 SH 8-18 (SUTURE) ×3 IMPLANT
TOWEL OR 17X24 6PK STRL BLUE (TOWEL DISPOSABLE) ×3 IMPLANT
TOWEL OR 17X26 10 PK STRL BLUE (TOWEL DISPOSABLE) ×3 IMPLANT
WATER STERILE IRR 1000ML POUR (IV SOLUTION) ×3 IMPLANT

## 2014-12-01 NOTE — Anesthesia Postprocedure Evaluation (Signed)
  Anesthesia Post-op Note  Patient: [name]  Procedure(s) Performed: Procedure(s): Left Lumbar Three-Four, Lumbar Four-Five Laminectomy and Foraminotomy (Left)  Patient Location: PACU  Anesthesia Type:General  Level of Consciousness: awake  Airway and Oxygen Therapy: Patient Spontanous Breathing  Post-op Pain: mild  Post-op Assessment: Post-op Vital signs reviewed   LLE Sensation: Full sensation   RLE Sensation: Full sensation      Post-op Vital Signs: Reviewed  Last Vitals:  Filed Vitals:   12/01/14 1200  BP:   Pulse: 59  Temp:   Resp: 32    Complications: No apparent anesthesia complications

## 2014-12-01 NOTE — Anesthesia Procedure Notes (Signed)
Procedure Name: Intubation Date/Time: 12/01/2014 9:33 AM Performed by: Willeen Cass P Pre-anesthesia Checklist: Patient identified, Timeout performed, Emergency Drugs available, Suction available and Patient being monitored Patient Re-evaluated:Patient Re-evaluated prior to inductionOxygen Delivery Method: Circle system utilized Preoxygenation: Pre-oxygenation with 100% oxygen Intubation Type: IV induction Ventilation: Mask ventilation without difficulty and Oral airway inserted - appropriate to patient size Grade View: Grade I Tube type: Oral Tube size: 7.5 mm Number of attempts: 1 Airway Equipment and Method: Stylet Placement Confirmation: ETT inserted through vocal cords under direct vision,  breath sounds checked- equal and bilateral and positive ETCO2 Secured at: 23 cm Tube secured with: Tape Dental Injury: Teeth and Oropharynx as per pre-operative assessment

## 2014-12-01 NOTE — Anesthesia Preprocedure Evaluation (Signed)
Anesthesia Evaluation  Patient identified by MRN, date of birth, ID band Patient awake    Reviewed: Allergy & Precautions, NPO status , Patient's Chart, lab work & pertinent test results  Airway Mallampati: II  TM Distance: >3 FB Neck ROM: Full    Dental   Pulmonary shortness of breath, sleep apnea , former smoker,    breath sounds clear to auscultation       Cardiovascular hypertension, + CAD, + Past MI, + Peripheral Vascular Disease and +CHF  + dysrhythmias + pacemaker  Rhythm:Regular Rate:Normal     Neuro/Psych  Headaches, PSYCHIATRIC DISORDERS Anxiety  Neuromuscular disease    GI/Hepatic negative GI ROS, Neg liver ROS,   Endo/Other  diabetes  Renal/GU negative Renal ROS     Musculoskeletal   Abdominal   Peds  Hematology   Anesthesia Other Findings   Reproductive/Obstetrics                             Anesthesia Physical Anesthesia Plan  ASA: III  Anesthesia Plan: General   Post-op Pain Management:    Induction: Intravenous  Airway Management Planned: Oral ETT  Additional Equipment:   Intra-op Plan:   Post-operative Plan: Possible Post-op intubation/ventilation  Informed Consent: I have reviewed the patients History and Physical, chart, labs and discussed the procedure including the risks, benefits and alternatives for the proposed anesthesia with the patient or authorized representative who has indicated his/her understanding and acceptance.   Dental advisory given  Plan Discussed with: CRNA and Anesthesiologist  Anesthesia Plan Comments:         Anesthesia Quick Evaluation

## 2014-12-01 NOTE — Brief Op Note (Signed)
12/01/2014  11:14 AM  PATIENT:  [name]  69 y.o. male  PRE-OPERATIVE DIAGNOSIS:  Stenosis  POST-OPERATIVE DIAGNOSIS:  Stenosis  PROCEDURE:  Procedure(s): Left Lumbar Three-Four, Lumbar Four-Five Laminectomy and Foraminotomy (Left)  SURGEON:  Surgeon(s) and Role:    * Earnie Larsson, MD - Primary    * Consuella Lose, MD - Assisting  PHYSICIAN ASSISTANT:   ASSISTANTS:    ANESTHESIA:   general  EBL:  Total I/O In: 1250 [I.V.:1000; IV Piggyback:250] Out: 400 [Blood:400]  BLOOD ADMINISTERED:none  DRAINS: none   LOCAL MEDICATIONS USED:  MARCAINE     SPECIMEN:  No Specimen  DISPOSITION OF SPECIMEN:  N/A  COUNTS:  YES  TOURNIQUET:  * No tourniquets in log *  DICTATION: .Dragon Dictation  PLAN OF CARE: Admit for overnight observation  PATIENT DISPOSITION:  PACU - hemodynamically stable.   Delay start of Pharmacological VTE agent (>24hrs) due to surgical blood loss or risk of bleeding: yes

## 2014-12-01 NOTE — Transfer of Care (Signed)
Immediate Anesthesia Transfer of Care Note  Patient: [name]  Procedure(s) Performed: Procedure(s): Left Lumbar Three-Four, Lumbar Four-Five Laminectomy and Foraminotomy (Left)  Patient Location: PACU  Anesthesia Type:General  Level of Consciousness: awake, alert , oriented and patient cooperative  Airway & Oxygen Therapy: Patient Spontanous Breathing and Patient connected to nasal cannula oxygen  Post-op Assessment: Report given to RN, Post -op Vital signs reviewed and stable and Patient moving all extremities X 4  Post vital signs: Reviewed and stable  Last Vitals:  BP 147/71 HR 60 (paced) RR 12 SpO2 98% Resting comfortably, maintains good airway, denies pain.  Complications: No apparent anesthesia complications

## 2014-12-01 NOTE — Op Note (Signed)
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  Asst. Surgeon: Kathyrn Sheriff  Anesthesia: General  Indication: 69 year old male with severe left lower extremity pain. Workup demonstrates evidence of spondylosis and stenosis on the left at L3-4 and L4-5. Patient is failed conservative management presents now for decompressive surgery in hopes of improving his symptoms.  Operative note: After induction of anesthesia, patient position prone onto Wilson frame and appropriately padded. Lumbar region prepped and draped. Incision made overlying L3-4-5. Dissection performed on the left side retractor placed x-ray taken level was confirmed. Decompressive laminotomies then performed using high-speed drill and Kerrison rongeurs to remove the inferior aspect lamina of L3 medial aspect the L3-4 facet joint and superior aspect the L lamina of L4 inferior aspect lamina of L4 medial aspect the L4-5 facet joint and superior aspect of the L5 lamina. Ligament flavum was elevated both levels and resected. Decompressive foraminotomies were then performed on course exiting L3-L4 and L5 nerve roots on the left. At this point a very thorough decompression been achieved. There is no evidence of injury to thecal sac and nerve roots. Wound is then irrigated MI solution. Gelfoam was placed topically for hemostasis. They come eyes and powder was left in the deep wound space. Wounds and close in layers with Vicryl sutures. Steri-Strips and sterile dressing were applied. No apparent complications. Patient tolerated the procedure well and he returns to the recovery room postop.

## 2014-12-01 NOTE — H&P (Signed)
[name] is an 69 y.o. male.   Chief Complaint: Left lower extremity pain. HPI: 69 year old male status post previous right-sided decompressive lumbar surgery presents with worsening left lower extremity pain in a mixed radicular pattern involving at least his left L4 and L5 nerve roots. Workup demonstrates evidence of significant spondylosis and stenosis at the left at L3-4 and L4-5. Patient presents now for decompressive surgery.  Past Medical History  Diagnosis Date  . 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 HYPERLIPIDEMIA 08/29/2006    takes Atorvastatin daily  . GOUT 04/22/2007    takes Allopurinol daily  . DEPRESSION 08/29/2006  . 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 DIASTOLIC HEART FAILURE, CHRONIC 06/09/2008  . BENIGN PROSTATIC HYPERTROPHY 08/29/2006    takes Flomax daily  . LUMBAR RADICULOPATHY, RIGHT 06/10/2007  . INSOMNIA-SLEEP DISORDER-UNSPEC 10/23/2007  . PACEMAKER, PERMANENT 03/04/2008    pt denies this date  . Left lumbar radiculopathy 05/30/2010  . Sleep apnea     "if I lay flat I quit breathing; HOB up I'm fine"  . Arthritis     "lower back; going back down both my sciatic nerves"  . Aortic root dilatation ([place]) 02/02/2011  . HYPERTENSION 08/29/2006    takes Lisinopril daily  . Myocardial infarction ([place]) 12/27/10    "I've had several MIs"  . Muscle spasm     takes Zanaflex daily  . CHF (congestive heart failure) (HCC)     takes Lasix daily  . Shortness of breath dyspnea     "all my life" with exertion  . History of migraine     77yr ago  . DIABETES MELLITUS, TYPE II 08/29/2006    takes Metformin,Januvia,and Glipizide  daily  . CORONARY ARTERY DISEASE 08/29/2006    takes Coumadin daily  . 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 Peripheral neuropathy (HCC)     takes Gabapentin daily  . Chronic back pain     HNP   . Cataracts, bilateral     immature  . Presence of permanent cardiac pacemaker     Past Surgical History  Procedure  Laterality Date  . S/p left arm surgury after work accident  1991    "2000# steel fell on it"  . S/p right hand surgury for foreign object  1970's    "piece of wood went in my hand; had to get that out"  . Insert / replace / remove pacemaker  ~ 2004    initial pacemaker placement  . Insert / replace / remove pacemaker  10/2009    generator change  . Colonoscopy    . Esophagogastroduodenoscopy N/A 12/01/2013    Procedure: ESOPHAGOGASTRODUODENOSCOPY (EGD);  Surgeon: DLafayette Dragon MD;  [facility];  Service: Endoscopy;  Laterality: N/A;  . Coronary artery bypass graft  1992    CABG X 2  . Coronary angioplasty with stent placement  12/27/10    "I've had a total of 9 cardiac stents put in"  . Lumbar laminectomy/decompression microdiscectomy Right 03/23/2014    Procedure: LAMINECTOMY AND FORAMINOTOMY RIGHT LUMBAR THREE-FOUR,LUMBAR FOUR-FIVE, LUMBAR FIVE-SACRAL ONE;  Surgeon: HCharlie Pitter MD;  [facility];  Service: Neurosurgery;  Laterality: Right;  right    Family History  Problem Relation Age of Onset  . Diabetes Mother   . Diabetes Sister   . Heart disease Sister     2 sister died with heart disease  . Coronary artery disease Other 585   male, first degree relative  . Diabetes  Other     1st degree relative  . Heart disease Sister   . Lung cancer Sister     deceased   Social History:  reports that he quit smoking about 41 years ago. His smoking use included Cigarettes. He has a 27 pack-year smoking history. He has quit using smokeless tobacco. He reports that he does not drink alcohol or use illicit drugs.  Allergies:  Allergies  Allergen Reactions  . Crestor [Rosuvastatin Calcium] Other (See Comments)    Urination of blood    Medications Prior to Admission  Medication Sig Dispense Refill  . allopurinol (ZYLOPRIM) 300 MG tablet take 1 tablet by mouth once daily (Patient taking differently: Take 300 mg by mouth daily. ) 90 tablet 3  . atorvastatin  (LIPITOR) 80 MG tablet Take 1 tablet (80 mg total) by mouth daily. 90 tablet 3  . Blood Glucose Monitoring Suppl (ONE TOUCH ULTRA SYSTEM KIT) W/DEVICE KIT 1 kit by Does not apply route once. 1 each 0  . carvedilol (COREG) 25 MG tablet Take 1 tablet (25 mg total) by mouth 2 (two) times daily. 180 tablet 3  . Cholecalciferol (VITAMIN D) 1000 UNITS capsule Take 1,000 Units by mouth daily.     . fluticasone (FLONASE) 50 MCG/ACT nasal spray Place 2 sprays into both nostrils 2 (two) times daily. 48 g 3  . furosemide (LASIX) 80 MG tablet Take 1 tablet (80 mg total) by mouth 2 (two) times daily. 60 tablet 5  . gabapentin (NEURONTIN) 300 MG capsule Take 2 capsules (600 mg total) by mouth 3 (three) times daily. 540 capsule 3  . glipiZIDE (GLUCOTROL XL) 10 MG 24 hr tablet Take 1 tablet (10 mg total) by mouth daily with breakfast. 90 tablet 3  . glucose blood (ONE TOUCH ULTRA TEST) test strip Use to check blood sugar twice a day Dx E11.40 (Patient taking differently: 1 each by Other route See admin instructions. Check blood sugar 3 times daily) 300 each 3  . HYDROcodone-acetaminophen (NORCO) 10-325 MG per tablet Take 2 tablets by mouth every 6 (six) hours as needed.     Marland Kitchen ibuprofen (ADVIL,MOTRIN) 200 MG tablet Take 800 mg by mouth daily as needed for mild pain or moderate pain.     Marland Kitchen lisinopril (PRINIVIL,ZESTRIL) 2.5 MG tablet Take 1 tablet (2.5 mg total) by mouth daily. 90 tablet 3  . metFORMIN (GLUCOPHAGE) 500 MG tablet Take 2 tablets (1,000 mg total) by mouth 2 (two) times daily. 360 tablet 3  . Multiple Vitamin (MULTIVITAMIN WITH MINERALS) TABS tablet Take 1 tablet by mouth every morning.    . potassium chloride SA (K-DUR,KLOR-CON) 20 MEQ tablet Take 1 tablet (20 mEq total) by mouth every evening. 90 tablet 3  . sitaGLIPtin (JANUVIA) 100 MG tablet Take 1 tablet (100 mg total) by mouth daily. 90 tablet 3  . tamsulosin (FLOMAX) 0.4 MG CAPS capsule Take 1 capsule (0.4 mg total) by mouth every 12 (twelve)  hours. 90 capsule 3  . tiZANidine (ZANAFLEX) 4 MG tablet TAKE 1 TABLET BY MOUTH  EVERY 6 HOURS AS NEEDED FOR MUSCLE SPASMS. 180 tablet 1  . traMADol (ULTRAM) 50 MG tablet Take 1 tablet (50 mg total) by mouth every 12 (twelve) hours as needed for moderate pain or severe pain. 60 tablet 2  . warfarin (COUMADIN) 5 MG tablet Take 1 tablet (5 mg total) by mouth daily. (Patient taking differently: Take 5 mg by mouth daily. Take 2.5 mg by mouth on Sunday, Tuesday, Thursday, Friday and  Saturday. Take 13m by mouth Monday and Wednesday.) 90 tablet 3  . HYDROcodone-acetaminophen (NORCO/VICODIN) 5-325 MG per tablet Take 1 tablet by mouth every 4 (four) hours as needed for moderate pain. 15 tablet 0  . oxyCODONE-acetaminophen (PERCOCET/ROXICET) 5-325 MG per tablet Take 1-2 tablets by mouth every 4 (four) hours as needed for moderate pain. 80 tablet 0    Results for orders placed or performed during the hospital encounter of 12/01/14 (from the past 48 hour(s))  Protime-INR     Status: None   Collection Time: 12/01/14  7:42 AM  Result Value Ref Range   Prothrombin Time 15.0 11.6 - 15.2 seconds   INR 1.16 0.00 - 1.49  Glucose, capillary     Status: Abnormal   Collection Time: 12/01/14  7:55 AM  Result Value Ref Range   Glucose-Capillary 177 (H) 65 - 99 mg/dL   No results found.  Pertinent items are noted in HPI.  Blood pressure 125/73, pulse 60, temperature 97.8 F (36.6 C), temperature source Oral, weight 101.787 kg (224 lb 6.4 oz), SpO2 97 %.  The patient is awake and alert. He is oriented and appropriate. His speech is fluent. His cranial nerve function is intact. His motor examination is intact bilaterally. Sensory examination reveals some mild left L4 and L5 sensory loss. Reflexes are normal except his Achilles reflexes are absent in his left patellar reflex is diminished. No evidence of long track signs. Gait is limited by pain. Postures mildly flexed. Lumbar wound well-healed. Motor examination head  ears eyes and throat is unremarkable. Chest and abdomen are benign. Extremities are free from injury or deformity. Implanted defibrillator in place. Assessment/Plan Left L3-4 and L4-5 spondylosis and stenosis. Plan left L3-4 and L4-5 decompressive laminotomy and foraminotomies. Risks and benefits of been explained. Patient wishes to proceed.  Nylen Creque A 12/01/2014, 9:03 AM

## 2014-12-01 NOTE — Patient Outreach (Signed)
[facility]) Care Management  12/01/2014  [name] 1946-01-20 [id number]   [name] is an 69 y.o. male who lives in [place] with his wife. [name] has DM Type II, CAD, Hyperlipidemia, HTN, and history of CHF. He has been followed by [facility] for over 2 years because of his multiple medical problems, surgeries, and falls.  DIABETES MANAGEMENT: I have been most recently working with [name] on Diabetes management. Diabetes management has been challenging for [name] and he admittedly has difficulty adhering to prescribed carb modified diet. However, in anticipation of his surgery today, he has worked diligently on adherence to his prescribed plan of care and has made progress with cbg management. His averages were as follows on my last visit 2 weeks ago:   September - October 7 day average (n=18) = 186 September - October 14 day average (n=32) = 165 September - October 30 day average (n=70) = 158   [name] has not had a HGA1C measured since January when it was 8.1. I contacted his primary care provider office for follow up and an order was faxed to [facility] labs. [name] was notified but did not go to the lab.    Unfortunately, [name] wife is currently hospitalized at [facility] and is not anticipated to discharge until earliest late this week.  She has CNA care (7 hours/day) that will resume on Monday.   CARE MANAGEMENT: Prior to admission, [name] was limited in mobility only by his back pain. He was still driving and was independent with all adl's and iadl's. He has cbg meter, bp monitor, and O2 sat monitor. He is required to take 3 navigate at least 3 steps to enter the rear of the home and 8-10 steps to enter from the front. The home is one level. [facility] has been his home health and DME provider in the past and his wife is currently followed by a [facility] from the [place] office.    [name] helped his wife, who is wheelchair bound, during hours when the CNA is not present prior to admission. [name] has a supportive family but they are unable to stay in the home to help and [name] and his wife wish to stay in their home. [name] and his wife have a rescue chihuahua named Bambi of whom they are very fond.   I will follow [name] progress and reach out to him for transition of care assessments, including a face to face visit in no more than 1 week after his discharge. I am available to see him at home on Thursday or Friday if he discharges between now and then.    [facility]  [phone]

## 2014-12-02 ENCOUNTER — Telehealth: Payer: Self-pay | Admitting: *Deleted

## 2014-12-02 ENCOUNTER — Encounter (HOSPITAL_COMMUNITY): Payer: Self-pay | Admitting: Neurosurgery

## 2014-12-02 DIAGNOSIS — E785 Hyperlipidemia, unspecified: Secondary | ICD-10-CM | POA: Diagnosis not present

## 2014-12-02 DIAGNOSIS — Z87891 Personal history of nicotine dependence: Secondary | ICD-10-CM | POA: Diagnosis not present

## 2014-12-02 DIAGNOSIS — I5032 Chronic diastolic (congestive) heart failure: Secondary | ICD-10-CM | POA: Diagnosis not present

## 2014-12-02 DIAGNOSIS — Z7984 Long term (current) use of oral hypoglycemic drugs: Secondary | ICD-10-CM | POA: Diagnosis not present

## 2014-12-02 DIAGNOSIS — I2510 Atherosclerotic heart disease of native coronary artery without angina pectoris: Secondary | ICD-10-CM | POA: Diagnosis not present

## 2014-12-02 DIAGNOSIS — N400 Enlarged prostate without lower urinary tract symptoms: Secondary | ICD-10-CM | POA: Diagnosis not present

## 2014-12-02 DIAGNOSIS — I252 Old myocardial infarction: Secondary | ICD-10-CM | POA: Diagnosis not present

## 2014-12-02 DIAGNOSIS — Z7901 Long term (current) use of anticoagulants: Secondary | ICD-10-CM | POA: Diagnosis not present

## 2014-12-02 DIAGNOSIS — Z951 Presence of aortocoronary bypass graft: Secondary | ICD-10-CM | POA: Diagnosis not present

## 2014-12-02 DIAGNOSIS — M47816 Spondylosis without myelopathy or radiculopathy, lumbar region: Secondary | ICD-10-CM | POA: Diagnosis not present

## 2014-12-02 DIAGNOSIS — Z950 Presence of cardiac pacemaker: Secondary | ICD-10-CM | POA: Diagnosis not present

## 2014-12-02 DIAGNOSIS — E1140 Type 2 diabetes mellitus with diabetic neuropathy, unspecified: Secondary | ICD-10-CM | POA: Diagnosis not present

## 2014-12-02 DIAGNOSIS — I10 Essential (primary) hypertension: Secondary | ICD-10-CM | POA: Diagnosis not present

## 2014-12-02 LAB — GLUCOSE, CAPILLARY: GLUCOSE-CAPILLARY: 140 mg/dL — AB (ref 65–99)

## 2014-12-02 MED ORDER — HYDROCODONE-ACETAMINOPHEN 10-325 MG PO TABS: 1.0000 | ORAL_TABLET | ORAL | Status: DC | PRN

## 2014-12-02 NOTE — Progress Notes (Signed)
Pt doing well. Pt given D/C instructions with Rx, verbal understanding was provided. Pt's incision is clean and dry with no sign of infection. Pt's IV was removed prior to D/C. Pt D/C'd home via wheelchair @ 1105 per MD order. Pt is stable @ D/C and has no other needs at this time. Holli Humbles, RN

## 2014-12-02 NOTE — Discharge Summary (Signed)
Physician Discharge Summary  Patient ID: [name] MRN: [id number] DOB/AGE: 1945-04-19 69 y.o.  Admit date: 12/01/2014 Discharge date: 12/02/2014  Admission Diagnoses:  Discharge Diagnoses:  Principal Problem:   Lumbar stenosis with neurogenic claudication Active Problems:   Lumbar stenosis   Discharged Condition: good  Hospital Course: The patient was admitted to the hospital where he underwent uncomplicated 2 level lumbar decompressive surgery laminotomy and foraminotomies. Postoperatively he is doing very well. Back and lower extremity pain much improved. Standing and walking without difficulty. Ready for discharge home.  Consults:   Significant Diagnostic Studies:   Treatments:   Discharge Exam: Blood pressure 103/64, pulse 59, temperature 98.7 F (37.1 C), temperature source Oral, resp. rate 18, weight 101.787 kg (224 lb 6.4 oz), SpO2 98 %. Awake and alert. Oriented and appropriate. Motor and sensory function intact. Wound clean and dry. Chest and abdomen benign.  Disposition: 01-Home or Self Care     Medication List    TAKE these medications        allopurinol 300 MG tablet  Commonly known as:  ZYLOPRIM  take 1 tablet by mouth once daily     atorvastatin 80 MG tablet  Commonly known as:  LIPITOR  Take 1 tablet (80 mg total) by mouth daily.     carvedilol 25 MG tablet  Commonly known as:  COREG  Take 1 tablet (25 mg total) by mouth 2 (two) times daily.     fluticasone 50 MCG/ACT nasal spray  Commonly known as:  FLONASE  Place 2 sprays into both nostrils 2 (two) times daily.     furosemide 80 MG tablet  Commonly known as:  LASIX  Take 1 tablet (80 mg total) by mouth 2 (two) times daily.     gabapentin 300 MG capsule  Commonly known as:  NEURONTIN  Take 2 capsules (600 mg total) by mouth 3 (three) times daily.     glipiZIDE 10 MG 24 hr tablet  Commonly known as:  GLUCOTROL XL  Take 1 tablet (10 mg total) by mouth daily with breakfast.     glucose blood test strip  Commonly known as:  ONE TOUCH ULTRA TEST  Use to check blood sugar twice a day Dx E11.40     HYDROcodone-acetaminophen 10-325 MG tablet  Commonly known as:  NORCO  Take 1-2 tablets by mouth every 4 (four) hours as needed.     ibuprofen 200 MG tablet  Commonly known as:  ADVIL,MOTRIN  Take 800 mg by mouth daily as needed for mild pain or moderate pain.     lisinopril 2.5 MG tablet  Commonly known as:  PRINIVIL,ZESTRIL  Take 1 tablet (2.5 mg total) by mouth daily.     metFORMIN 500 MG tablet  Commonly known as:  GLUCOPHAGE  Take 2 tablets (1,000 mg total) by mouth 2 (two) times daily.     multivitamin with minerals Tabs tablet  Take 1 tablet by mouth every morning.     ONE TOUCH ULTRA SYSTEM KIT W/DEVICE Kit  1 kit by Does not apply route once.     oxyCODONE-acetaminophen 5-325 MG tablet  Commonly known as:  PERCOCET/ROXICET  Take 1-2 tablets by mouth every 4 (four) hours as needed for moderate pain.     potassium chloride SA 20 MEQ tablet  Commonly known as:  K-DUR,KLOR-CON  Take 1 tablet (20 mEq total) by mouth every evening.     sitaGLIPtin 100 MG tablet  Commonly known as:  JANUVIA  Take 1 tablet (  100 mg total) by mouth daily.     tamsulosin 0.4 MG Caps capsule  Commonly known as:  FLOMAX  Take 1 capsule (0.4 mg total) by mouth every 12 (twelve) hours.     tiZANidine 4 MG tablet  Commonly known as:  ZANAFLEX  TAKE 1 TABLET BY MOUTH  EVERY 6 HOURS AS NEEDED FOR MUSCLE SPASMS.     traMADol 50 MG tablet  Commonly known as:  ULTRAM  Take 1 tablet (50 mg total) by mouth every 12 (twelve) hours as needed for moderate pain or severe pain.     Vitamin D 1000 UNITS capsule  Take 1,000 Units by mouth daily.     warfarin 5 MG tablet  Commonly known as:  COUMADIN  Take 1 tablet (5 mg total) by mouth daily.           Follow-up Information    Follow up with Charlie Pitter, MD.   Specialty:  Neurosurgery   Contact information:   [address] [place] [address] [phone]       Signed: Charlie Pitter 12/02/2014, 10:00 AM

## 2014-12-02 NOTE — Discharge Instructions (Signed)
Wound Care Keep incision covered and dry for two days.  If you shower, cover incision with plastic wrap.  Do not put any creams, lotions, or ointments on incision. Leave steri-strips on back.  They will fall off by themselves. Activity Walk each and every day, increasing distance each day. No lifting greater than 5 lbs.  Avoid excessive neck motion. No driving for 2 weeks; may ride as a passenger locally. If provided with back brace, wear when out of bed.  It is not necessary to wear brace in bed. Diet Resume your normal diet.  Return to Work Will be discussed at you follow up appointment. Call Your Doctor If Any of These Occur Redness, drainage, or swelling at the wound.  Temperature greater than 101 degrees. Severe pain not relieved by pain medication. Incision starts to come apart. Follow Up Appt Call today for appointment in 1-2 weeks ([phone]) or for problems.  If you have any hardware placed in your spine, you will need an x-ray before your appointment.  Wound Care Keep incision covered and dry for one week.  If you shower prior to then, cover incision with plastic wrap.  You may remove outer bandage after one week and shower.  Do not put any creams, lotions, or ointments on incision. Leave steri-strips on neck.  They will fall off by themselves. Activity Walk each and every day, increasing distance each day. No lifting greater than 5 lbs.  Avoid excessive neck motion. No driving for 2 weeks; may ride as a passenger locally. If provided with back brace, wear when out of bed.  It is not necessary to wear brace in bed. Diet Resume your normal diet.  Return to Work Will be discussed at you follow up appointment. Call Your Doctor If Any of These Occur Redness, drainage, or swelling at the wound.  Temperature greater than 101 degrees. Severe pain not relieved by pain medication. Incision starts to come apart. Follow Up Appt Call today for appointment in 1-2 weeks ([phone])  or for problems.  If you have any hardware placed in your spine, you will need an x-ray before your appointment.   Laminectomy During a laminectomy, small pieces of bone in the spine called lamina are removed. The ligaments underneath the lamina and parts of the joints that have grown too large are also removed. This takes pressure off the nerves.  LET [facility] CARE PROVIDER KNOW ABOUT:  Any allergies you have.  All medicines you are taking, including vitamins, herbs, eye drops, creams, and over-the-counter medicines.  Previous problems you or members of your family have had with the use of anesthetics.  Any blood disorders you have.  Previous surgeries you have had.  Medical conditions you have. RISKS AND COMPLICATIONS  Generally, laminectomy is a safe procedure. However, as with any procedure, complications can occur. Possible complications include:  Infection near the incision.  Nerve damage. Signs of this can be pain, weakness, or numbness.  Leaking of spinal fluid.  Blood clot in a leg. The clot can move to the lungs. This can be very serious.  Bowel or bladder incontinence (rare). BEFORE THE PROCEDURE   You will need to stop taking certain medicines as directed by your health care provider.  If you smoke, stop at least 2 weeks before the procedure. Smoking can slow down the healing process and increase the risk of complications.  Do not eat or drink anything for at least 8 hours before the procedure. Take any medicines that your  health care provider tells you to keep taking with a sip of water.  Do not drink alcohol the day before your surgery.  Tell your health care provider if you develop a cold or any infection before your surgery.  Arrange for someone to drive you home after the procedure or after your hospital stay. Also arrange for someone to help you with activities during recovery. PROCEDURE  Small monitors will be placed on your body. They are used to  check your heart, blood pressure, and oxygen level.  An IV tube will be inserted into one of your veins. Medicine will flow directly into your body through the IV tube.  You might be given a sedative. This will help you relax.  You will be given a medicine to make you sleep (general anesthetic), and a breathing tube will be placed into your lungs. During general anesthesia, you are unaware of the procedure and do not feel any pain.  Your back will be cleaned with a special solution to kill germs on your skin.  Once you are asleep, the surgeon will make a 2-inch to 5-inch cut (incision) in your back. The length of the incision will depend on how many spinal bones (vertebrae) are being operated on.  Muscles in the back will be moved away from the vertebrae and pulled to the side.  Pieces of lamina will be removed.  The ligament that lies under the lamina and connects your vertebrae will be removed.  Enough ligaments and thickened joints will be removed to take pressure off your nerves.  Your nerves will be identified, and their passage will be tracked and assessed for excessive tightness.  Your back muscles will be moved back into their normal position.  The area under your skin will be closed with small, absorbable stitches. These stitches do not need to be removed.  Your skin will be closed with small absorbable stitches or staples.  A dressing will be put over your incision.  The procedure may take 1-3 hours. AFTER THE PROCEDURE   You will stay in a recovery area until the anesthesia has worn off. Your blood pressure and pulse will be checked every so often. Then you will be taken to a hospital room.  You may continue to get fluids through the IV tube for a while.  Some pain is normal. You may be given pain medicine while still in the recovery area.  It is important to be up and moving as soon as possible after a surgery. Physical therapists will help you start walking.  To  prevent blood clots in your legs:  You may be given special stockings to wear.  You may need to take medicine to prevent clots.  You may be asked to do special breathing exercises to re-expand your lungs. This is to prevent a lung infection.  Most people stay in the hospital for 1-3 days after a laminectomy.   This information is not intended to replace advice given to you by your health care provider. Make sure you discuss any questions you have with your health care provider.   Document Released: 01/04/2009 Document Revised: 11/06/2012 Document Reviewed: 08/28/2012 Elsevier Interactive Patient Education [employer].

## 2014-12-02 NOTE — Telephone Encounter (Signed)
Pt was on TCM list he had 2 Level decompressive laminotomy surgery. D/c 12/02/14 will be following up in 2 wks with Dr. Trenton Gammon...Johny Chess

## 2014-12-03 ENCOUNTER — Encounter: Payer: Self-pay | Admitting: *Deleted

## 2014-12-03 ENCOUNTER — Other Ambulatory Visit: Payer: Self-pay | Admitting: *Deleted

## 2014-12-03 NOTE — Patient Outreach (Signed)
[facility]) Care Management   12/03/2014  DEVAL MROCZKA 11/02/1945 [id number]  RONNE STEFANSKI is an 69 y.o. male who is followed by [facility] for DM and HTN Disease Management and Care management services. I am seeing Mr. Pollan at home today at his request to check his surgical site. His wife was hospitalized last week and is still inpatient. He doesn't have anyone to check his surgical site and was concerned about it.   Subjective: "It's a good thing I have this pain medicine!"  Objective: Mr. Weinberg is s/p Left Lumbar Three-Four, Lumbar Four-Five Laminectomy and Foraminotomy on 12/01/14. He is at home, alert and oriented, ambulating with a cane. Mr. Ruacho rose this morning and was able to perform ADL's independently.  Review of Systems  Constitutional: Positive for malaise/fatigue. Negative for fever and chills.  HENT: Negative.   Eyes: Negative.   Respiratory: Negative.   Cardiovascular: Negative.   Gastrointestinal: Negative.   Genitourinary: Negative.   Musculoskeletal: Positive for myalgias and back pain. Negative for falls.       Reports surgical pain and soreness relieved by prescribed narcotic analgesic  Skin: Negative.        Honeycomb dressing in place lower back   Neurological: Negative for dizziness, tingling, tremors and sensory change.  Psychiatric/Behavioral: Negative.     Physical Exam  Constitutional: He is oriented to person, place, and time. He appears well-developed and well-nourished. He is active.  Cardiovascular: Normal rate and regular rhythm.   Respiratory: Effort normal and breath sounds normal.  GI: Soft. Bowel sounds are normal.  Neurological: He is alert and oriented to person, place, and time. Coordination normal.  Skin: Skin is warm, dry and intact.     Psychiatric: He has a normal mood and affect. His speech is normal and behavior is normal. Judgment and thought content normal. He exhibits normal recent memory.    Mr. Wuertz did not recall that he called me leaving a message on my cell phone yesterday. When he called me this morning he repeated "I just wanted to let you know I was home from the hospital." When I told him this at our visit today, he laughed saying "I'm sure that was the medicine."    Assessment:  Mr. Delduca appears to be doing well post surgically. He is ambulating with his cane and says his medication is controlling his pain. He is eating and can perform all ADL's independently. He understands his discharge instructions.   Plan: Mr. Didonato will call for any new symptoms or if he has questions or concerns. I will see Mr. Atienza at home next week for our previously scheduled appointment.    [facility]  [phone]

## 2014-12-04 ENCOUNTER — Other Ambulatory Visit: Payer: Self-pay | Admitting: *Deleted

## 2014-12-08 ENCOUNTER — Other Ambulatory Visit: Payer: Self-pay | Admitting: *Deleted

## 2014-12-08 ENCOUNTER — Ambulatory Visit: Payer: Medicare Other | Admitting: *Deleted

## 2014-12-08 NOTE — Patient Outreach (Signed)
[facility]) Care Management   12/08/2014  [name] [birth year] [id number]  [name] is an 69 y.o. male who is followed by [facility] for DM and HTN Disease Management and Care management services. I am seeing [name] at home today at his request to check his surgical site. His wife was hospitalized last week and is still inpatient. He doesn't have anyone to check his surgical site and was concerned about it.   Subjective: "I'm feeling a whole lot better. Sore. But I'm not hurting like I was before surgery."  Objective:  BP 100/62 mmHg  Pulse 67  Wt 206 lb (93.441 kg)  SpO2 97%   [name] is s/p Left Lumbar Three-Four, Lumbar Four-Five Laminectomy and Foraminotomy on 12/01/14. He is at home, alert and oriented, ambulating with a cane. [name] rose this morning and was able to perform ADL's independently.   Review of Systems  Constitutional: Positive for malaise/fatigue.  HENT: Negative.   Eyes: Negative.   Respiratory: Negative.   Cardiovascular: Negative.   Gastrointestinal: Negative.   Genitourinary: Negative.   Musculoskeletal: Positive for myalgias and back pain. Negative for falls.       Patient reports "soreness" of back but no acute pain unrelieved by prescribed analgesics  Skin:       Incision lower lumbar area with dressing clean/dry/intact; no signs of skin affect  Neurological: Negative.   Psychiatric/Behavioral: Negative.     Physical Exam  Constitutional: He is oriented to person, place, and time. Vital signs are normal. He appears well-developed and well-nourished. He is active.  Cardiovascular: Normal rate and regular rhythm.   Respiratory: Effort normal and breath sounds normal.  GI: Soft. Bowel sounds are normal.  Neurological: He is alert and oriented to person, place, and time.  Skin: Skin is warm and dry.     Psychiatric: He has a normal mood and affect. His speech is normal and behavior is normal.  Judgment and thought content normal. Cognition and memory are normal.    Current Medications:   Current Outpatient Prescriptions  Medication Sig Dispense Refill  . allopurinol (ZYLOPRIM) 300 MG tablet take 1 tablet by mouth once daily (Patient taking differently: Take 300 mg by mouth daily. ) 90 tablet 3  . atorvastatin (LIPITOR) 80 MG tablet Take 1 tablet (80 mg total) by mouth daily. 90 tablet 3  . Blood Glucose Monitoring Suppl (ONE TOUCH ULTRA SYSTEM KIT) W/DEVICE KIT 1 kit by Does not apply route once. 1 each 0  . carvedilol (COREG) 25 MG tablet Take 1 tablet (25 mg total) by mouth 2 (two) times daily. 180 tablet 3  . Cholecalciferol (VITAMIN D) 1000 UNITS capsule Take 1,000 Units by mouth daily.     . fluticasone (FLONASE) 50 MCG/ACT nasal spray Place 2 sprays into both nostrils 2 (two) times daily. 48 g 3  . furosemide (LASIX) 80 MG tablet Take 1 tablet (80 mg total) by mouth 2 (two) times daily. 60 tablet 5  . gabapentin (NEURONTIN) 300 MG capsule Take 2 capsules (600 mg total) by mouth 3 (three) times daily. 540 capsule 3  . glipiZIDE (GLUCOTROL XL) 10 MG 24 hr tablet Take 1 tablet (10 mg total) by mouth daily with breakfast. 90 tablet 3  . glucose blood (ONE TOUCH ULTRA TEST) test strip Use to check blood sugar twice a day Dx E11.40 (Patient taking differently: 1 each by Other route See admin instructions. Check blood sugar 3 times daily) 300  each 3  . HYDROcodone-acetaminophen (NORCO) 10-325 MG tablet Take 1-2 tablets by mouth every 4 (four) hours as needed. 80 tablet 0  . ibuprofen (ADVIL,MOTRIN) 200 MG tablet Take 800 mg by mouth daily as needed for mild pain or moderate pain.     Marland Kitchen lisinopril (PRINIVIL,ZESTRIL) 2.5 MG tablet Take 1 tablet (2.5 mg total) by mouth daily. 90 tablet 3  . metFORMIN (GLUCOPHAGE) 500 MG tablet Take 2 tablets (1,000 mg total) by mouth 2 (two) times daily. 360 tablet 3  . Multiple Vitamin (MULTIVITAMIN WITH MINERALS) TABS tablet Take 1 tablet by mouth every  morning.    Marland Kitchen oxyCODONE-acetaminophen (PERCOCET/ROXICET) 5-325 MG per tablet Take 1-2 tablets by mouth every 4 (four) hours as needed for moderate pain. (Patient not taking: Reported on 12/03/2014) 80 tablet 0  . potassium chloride SA (K-DUR,KLOR-CON) 20 MEQ tablet Take 1 tablet (20 mEq total) by mouth every evening. 90 tablet 3  . sitaGLIPtin (JANUVIA) 100 MG tablet Take 1 tablet (100 mg total) by mouth daily. 90 tablet 3  . tamsulosin (FLOMAX) 0.4 MG CAPS capsule Take 1 capsule (0.4 mg total) by mouth every 12 (twelve) hours. 90 capsule 3  . tiZANidine (ZANAFLEX) 4 MG tablet TAKE 1 TABLET BY MOUTH  EVERY 6 HOURS AS NEEDED FOR MUSCLE SPASMS. 180 tablet 1  . traMADol (ULTRAM) 50 MG tablet Take 1 tablet (50 mg total) by mouth every 12 (twelve) hours as needed for moderate pain or severe pain. 60 tablet 2  . warfarin (COUMADIN) 5 MG tablet Take 1 tablet (5 mg total) by mouth daily. (Patient taking differently: Take 5 mg by mouth daily. Take 2.5 mg by mouth on Sunday, Tuesday, Thursday, Friday and Saturday. Take 72m by mouth Monday and Wednesday.) 90 tablet 3   Assessment:  Brief visit today while I'm also seeing his wife at home. Mr. PKnickerbockeris s/p Left Lumbar Three-Four, Lumbar Four-Five Laminectomy and Foraminotomy on 12/01/14. He is progressing and says he feels his pain is improved. He is still moving about slowly but is able to perform all adl's independently.   Plan: Transition of Care Call #3 next week. Home Visit in December.    [facility]  [phone]

## 2014-12-10 ENCOUNTER — Encounter: Payer: Self-pay | Admitting: Cardiology

## 2014-12-14 ENCOUNTER — Ambulatory Visit: Payer: Medicare Other | Admitting: Pharmacist Clinician (PhC)/ Clinical Pharmacy Specialist

## 2014-12-17 ENCOUNTER — Other Ambulatory Visit: Payer: Self-pay | Admitting: Internal Medicine

## 2014-12-18 ENCOUNTER — Other Ambulatory Visit: Payer: Self-pay | Admitting: Internal Medicine

## 2014-12-21 ENCOUNTER — Ambulatory Visit (INDEPENDENT_AMBULATORY_CARE_PROVIDER_SITE_OTHER): Payer: Medicare Other | Admitting: Pharmacist Clinician (PhC)/ Clinical Pharmacy Specialist

## 2014-12-21 DIAGNOSIS — I483 Typical atrial flutter: Secondary | ICD-10-CM

## 2014-12-21 DIAGNOSIS — I482 Chronic atrial fibrillation, unspecified: Secondary | ICD-10-CM

## 2014-12-21 DIAGNOSIS — Z7901 Long term (current) use of anticoagulants: Secondary | ICD-10-CM | POA: Diagnosis not present

## 2014-12-21 LAB — POCT INR: INR: 3

## 2015-01-04 ENCOUNTER — Other Ambulatory Visit: Payer: Self-pay | Admitting: Internal Medicine

## 2015-01-05 ENCOUNTER — Ambulatory Visit: Payer: Medicare Other | Admitting: *Deleted

## 2015-01-11 ENCOUNTER — Ambulatory Visit (INDEPENDENT_AMBULATORY_CARE_PROVIDER_SITE_OTHER): Payer: Medicare Other | Admitting: Pharmacist Clinician (PhC)/ Clinical Pharmacy Specialist

## 2015-01-11 DIAGNOSIS — I482 Chronic atrial fibrillation, unspecified: Secondary | ICD-10-CM

## 2015-01-11 DIAGNOSIS — Z7901 Long term (current) use of anticoagulants: Secondary | ICD-10-CM

## 2015-01-11 DIAGNOSIS — I483 Typical atrial flutter: Secondary | ICD-10-CM

## 2015-01-11 LAB — POCT INR: INR: 2.1

## 2015-02-02 ENCOUNTER — Ambulatory Visit: Payer: Medicare Other | Admitting: Internal Medicine

## 2015-02-10 DIAGNOSIS — H524 Presbyopia: Secondary | ICD-10-CM | POA: Diagnosis not present

## 2015-02-10 DIAGNOSIS — Z7984 Long term (current) use of oral hypoglycemic drugs: Secondary | ICD-10-CM | POA: Diagnosis not present

## 2015-02-10 DIAGNOSIS — H5203 Hypermetropia, bilateral: Secondary | ICD-10-CM | POA: Diagnosis not present

## 2015-02-10 DIAGNOSIS — E119 Type 2 diabetes mellitus without complications: Secondary | ICD-10-CM | POA: Diagnosis not present

## 2015-02-15 ENCOUNTER — Other Ambulatory Visit: Payer: Self-pay | Admitting: *Deleted

## 2015-02-15 NOTE — Patient Outreach (Signed)
[facility]) Care Management   02/15/2015  [name] [birth year] [id number]  [name] is an 70 y.o. male who is followed by [facility] for DM and HTN Disease Management and Care management services. I am seeing [name] today for routine home visit. His wife was in the hospital twice over the last two months and I have been unable to see [name] routinely.   [name] had back surgery on 12/02/14 and has experienced drastic improvement in mobility and pain management. However, between his surgery, his wife's illnesses, holidays, and general "getting out of routine" [name] admits to dietary indiscretion and not monitoring cbg's as prescribed. He has continued frequent weights and bp checks but has admittedly avoided cbg checks as he assumed they would be high.   Subjective: "I haven't been checking them. I knew they would be up."   Objective:  BP 96/60 mmHg  Pulse 69  SpO2 98%  Review of Systems  Constitutional: Negative.   HENT: Negative.   Eyes: Negative.   Respiratory: Negative.   Cardiovascular: Negative for chest pain and leg swelling.  Gastrointestinal: Negative.   Genitourinary: Negative.   Musculoskeletal: Negative for back pain, joint pain and falls.  Skin: Negative.   Neurological: Negative.   Psychiatric/Behavioral: Negative.        Mood is happy today; improved from our last face to face visit and our conversations over the phone when he was quite concerned about his wife and her health conditions    Physical Exam  Constitutional: He is oriented to person, place, and time. Vital signs are normal. He appears well-developed and well-nourished. He is active.  Neck: No JVD present.  Cardiovascular: Normal rate and regular rhythm.   Respiratory: Effort normal. He has decreased breath sounds in the right lower field and the left lower field.  GI: Soft. Bowel sounds are normal.  Neurological: He is alert and oriented to  person, place, and time. He has normal strength.  Skin: Skin is warm and dry.  Psychiatric: He has a normal mood and affect. His speech is normal and behavior is normal. Judgment and thought content normal. Cognition and memory are normal.    Current Medications:   Current Outpatient Prescriptions  Medication Sig Dispense Refill  . allopurinol (ZYLOPRIM) 300 MG tablet take 1 tablet by mouth once daily (Patient taking differently: Take 300 mg by mouth daily. ) 90 tablet 3  . atorvastatin (LIPITOR) 80 MG tablet Take 1 tablet by mouth  daily 90 tablet 1  . Blood Glucose Monitoring Suppl (ONE TOUCH ULTRA 2) W/DEVICE KIT Use as directed 1 each 1  . carvedilol (COREG) 25 MG tablet Take 1 tablet (25 mg total) by mouth 2 (two) times daily. 180 tablet 3  . Cholecalciferol (VITAMIN D) 1000 UNITS capsule Take 1,000 Units by mouth daily.     . fluticasone (FLONASE) 50 MCG/ACT nasal spray Use 2 sprays in each  nostril every day 48 g 1  . furosemide (LASIX) 40 MG tablet Take 1 tablet by mouth  daily 90 tablet 1  . furosemide (LASIX) 80 MG tablet Take 1 tablet (80 mg total) by mouth 2 (two) times daily. 60 tablet 5  . gabapentin (NEURONTIN) 300 MG capsule Take 2 capsules by mouth 3  times daily 270 capsule 1  . glipiZIDE (GLUCOTROL XL) 10 MG 24 hr tablet Take 1 tablet by mouth  daily with breakfast 90 tablet 1  . glucose blood (ONE TOUCH  ULTRA TEST) test strip Use to check blood sugar twice a day Dx E11.40 (Patient taking differently: 1 each by Other route See admin instructions. Check blood sugar 3 times daily) 300 each 3  . HYDROcodone-acetaminophen (NORCO) 10-325 MG tablet Take 1-2 tablets by mouth every 4 (four) hours as needed. 80 tablet 0  . ibuprofen (ADVIL,MOTRIN) 200 MG tablet Take 800 mg by mouth daily as needed for mild pain or moderate pain.     Marland Kitchen lisinopril (PRINIVIL,ZESTRIL) 2.5 MG tablet Take 1 tablet (2.5 mg total) by mouth daily. 90 tablet 3  . metFORMIN (GLUCOPHAGE) 500 MG tablet Take 2  tablets by mouth two times daily 180 tablet 1  . Multiple Vitamin (MULTIVITAMIN WITH MINERALS) TABS tablet Take 1 tablet by mouth every morning.    Marland Kitchen oxyCODONE-acetaminophen (PERCOCET/ROXICET) 5-325 MG per tablet Take 1-2 tablets by mouth every 4 (four) hours as needed for moderate pain. (Patient not taking: Reported on 12/03/2014) 80 tablet 0  . potassium chloride SA (K-DUR,KLOR-CON) 20 MEQ tablet Take 1 tablet by mouth  every evening 90 tablet 1  . sitaGLIPtin (JANUVIA) 100 MG tablet Take 1 tablet (100 mg total) by mouth daily. 90 tablet 3  . tamsulosin (FLOMAX) 0.4 MG CAPS capsule Take 1 capsule by mouth  every 12 hours 180 capsule 1  . tiZANidine (ZANAFLEX) 4 MG tablet TAKE 1 TABLET BY MOUTH  EVERY 6 HOURS AS NEEDED FOR MUSCLE SPASMS. 180 tablet 1  . traMADol (ULTRAM) 50 MG tablet Take 1 tablet (50 mg total) by mouth every 12 (twelve) hours as needed for moderate pain or severe pain. 60 tablet 2      Assessment:   Chronic Health Condition (left hip pain; trochanteric bursitis) - [name] had back surgery on 12/03/14 and says he has had significant improvement in resolution of pain and has visible improvement in gait and mobility, He is taking prescribed oral narcotic analgesics as needed but frequently only needs OTC NSAIDS for managaement  Chronic Health Condition (DM) - [name] typically checks his cbg's faithfully 2-3 times daily but admits that he has not checked his cbg routinely for a few months because of multiple family illnesses and the holidays; [name] says he understands the importance of self management and is committed to restarting self monitoring today.   Notably, [name] cbg today, 4 hours after a heavy breakfast was 71. He only 3 monitor readings for the last 30 day period which averaged 164. I discussed with him the importance of monitoring for hypoglycemia and maintaining adequate oral intake; he reports eating 3 meals/day and has good appetite.   Chronic  Health Condition (CHF) - [name] has not had CHF exacerbation symptoms; he has no edema but mild soft tissue swelling in the right foot/ankle as per his baseline;  [name] is taking Lasix 75m BID as prescribed; he does not consistently adhere to a sodium restricted diet  Weights: 02/15/15 = 210# 12/08/14 = 206# 11/19/14 = 210# 10/15/14 = 205# 09/16/14 = 210# 08/28/14 = 210# 07/21/14 = 226# 07/15/14 = 228# 06/18/14 = 220#  Chronic Health Condition (HTN) - Mr. PMottablood pressure has been in provider recommended range for several months; he takes medications as prescribed  Medication Management Concerns - during our medication review today, I noted that Mr. PGaviganhad several medications in mislabeled bottles; he was able to immediately identify the medication and knew the prescribed dose and use of the medication. I spent some time re-labeling his bottles  and explained the importance of keeping all medications in appropriately labeled containers.   Plan:  [name] will keep all prescriptions in properly labeled bottles.   [name] will resume checking cbg's at least BID and record.   I will see [name] at home next month for routine home visit.   [facility] CM Care Plan Problem Two        Most Recent Value   Care Plan Problem Two  Diabetes Management Concerns   Role Documenting the Problem Two  Care Management Coordinator   Care Plan for Problem Two  Active   Interventions for Problem Two Long Term Goal   Utilizing teachback method, rviewed importance of self health management and addressed goal setting   THN Long Term Goal (31-90) days  Over the next 60 days patient will verbalize and demonstrate understanding of self health management plan for DM as evidenced by documentation of cbg's in blue [facility] Care Management notebook and review of glucose monitor findings   THN Long Term Goal Start Date  02/15/15   THN CM Short Term Goal #1 (0-30 days)  Over the next 30 days, patient  will check cbg at least BID as instructed and will record findings in [place] [place] [facility] notebook   [facility] CM Short Term Goal #1 Start Date  02/15/15   Interventions for Short Term Goal #2   Utilizing teachback method, reviewed with patient prescribed frquency for CBG checks and rationale   THN CM Short Term Goal #2 (0-30 days)  Over the next 30 days, patient will work towards adherence to prescribed diet as evidenced by his verbalization of resumption of avoiding sweets and starchy carbs and resuming previously adhered to dietary guidelines   THN CM Short Term Goal #2 Start Date  02/15/15   Interventions for Short Term Goal #2  Utilizing teachback method, reviewed basic guidelines with patient of prescribed diet,  EMMI educational materials provided previously    [facility] CM Care Plan Problem Three        Most Recent Value   Care Plan Problem Three  Medication Management Concerns   Role Documenting the Problem Three  Care Management Coordinator   Care Plan for Problem Three  Not Active   THN Long Term Goal (31-90) days  Over the next 31 days patient will store all medications in properly labeled bottles as evidenced by RN Case Manager review   THN Long Term Goal Start Date  02/15/15   Interventions for Problem Three Long Term Goal  Helped patient properly label prescription medication bottles      [facility]  [phone]

## 2015-02-16 ENCOUNTER — Other Ambulatory Visit: Payer: Self-pay | Admitting: Internal Medicine

## 2015-02-16 ENCOUNTER — Encounter: Payer: Self-pay | Admitting: *Deleted

## 2015-02-18 ENCOUNTER — Other Ambulatory Visit: Payer: Self-pay

## 2015-02-18 MED ORDER — GLUCOSE BLOOD VI STRP: ORAL_STRIP | Status: DC

## 2015-02-22 ENCOUNTER — Ambulatory Visit (INDEPENDENT_AMBULATORY_CARE_PROVIDER_SITE_OTHER): Payer: Medicare Other | Admitting: Pharmacist Clinician (PhC)/ Clinical Pharmacy Specialist

## 2015-02-22 DIAGNOSIS — I482 Chronic atrial fibrillation, unspecified: Secondary | ICD-10-CM

## 2015-02-22 DIAGNOSIS — I483 Typical atrial flutter: Secondary | ICD-10-CM | POA: Diagnosis not present

## 2015-02-22 DIAGNOSIS — Z7901 Long term (current) use of anticoagulants: Secondary | ICD-10-CM | POA: Diagnosis not present

## 2015-02-22 LAB — POCT INR: INR: 3.8

## 2015-03-01 ENCOUNTER — Encounter: Payer: Self-pay | Admitting: *Deleted

## 2015-03-15 ENCOUNTER — Other Ambulatory Visit: Payer: Self-pay | Admitting: Internal Medicine

## 2015-03-15 ENCOUNTER — Other Ambulatory Visit: Payer: Self-pay | Admitting: *Deleted

## 2015-03-15 ENCOUNTER — Encounter: Payer: Medicare Other | Admitting: Pharmacist Clinician (PhC)/ Clinical Pharmacy Specialist

## 2015-03-15 NOTE — Patient Outreach (Signed)
[facility]) Care Management  03/15/2015  [name] Apr 06, 1945 [id number]  Call received from [name] today who reports that his Lisinopril "can't be approved for refill". He said he wasn't sure who to call or who denied his refill. I sent a message to Tomma Lightning, MA for Dr. Cathlean Cower to see if [name] might be able to help [place] with this concern.   Plan: I will follow up with [name] tomorrow.    [place]  [phone]

## 2015-03-17 ENCOUNTER — Other Ambulatory Visit: Payer: Self-pay

## 2015-03-17 MED ORDER — LISINOPRIL 2.5 MG PO TABS: 2.5000 mg | ORAL_TABLET | Freq: Every day | ORAL | Status: DC

## 2015-03-22 ENCOUNTER — Other Ambulatory Visit: Payer: Self-pay | Admitting: *Deleted

## 2015-03-22 NOTE — Patient Outreach (Signed)
[name]) Care Management   03/22/2015  [name] May 11, 1945 [id number]  [name] is an 70 y.o. male who is followed by [facility] for DM and HTN Disease Management and Care management services. I am seeing [name] today for routine home visit.    [name] had back surgery on 12/02/14 and has experienced drastic improvement in mobility and pain management. However, between his surgery, his wife's illnesses, holidays, and general "getting out of routine" [name] admits to dietary indiscretion and not monitoring cbg's as prescribed until last month. [name] did a great job of "getting back on the wagon" (his words) and has reinstituted an exercise regimen and is working diligently to make improvements in adherence to his prescribed carb modified diet. The evidence of his work is noted in his improved cbg's and stablized weight.   Subjective: "I know I"m doing better because I'm Collier care of what I eat and I'm starting to get around a little more. My numbers are getting better and better"  Objective:  BP 132/68 mmHg  Pulse 62  Ht 1.676 m ('5\' 6"' )  Wt 200 lb (90.719 kg)  BMI 32.30 kg/m2  Review of Systems  Constitutional: Negative.   Respiratory: Negative.   Cardiovascular: Negative.  Negative for leg swelling.  Gastrointestinal: Negative.   Musculoskeletal: Positive for back pain.  Skin: Negative.   Neurological: Negative.     Physical Exam  Constitutional: He is oriented to person, place, and time. He appears well-developed and well-nourished.  Cardiovascular: Normal rate, regular rhythm and normal heart sounds.   Respiratory: Effort normal and breath sounds normal.  GI: Soft. Bowel sounds are normal.  Neurological: He is alert and oriented to person, place, and time.  Skin: Skin is warm and dry.  Psychiatric: He has a normal mood and affect. His behavior is normal. Judgment and thought content normal.    Current  Medications:   Current Outpatient Prescriptions  Medication Sig Dispense Refill  . allopurinol (ZYLOPRIM) 300 MG tablet take 1 tablet by mouth once daily (Patient Collier differently: Take 300 mg by mouth daily. ) 90 tablet 3  . atorvastatin (LIPITOR) 80 MG tablet Take 1 tablet by mouth  daily 90 tablet 1  . Blood Glucose Monitoring Suppl (ONE TOUCH ULTRA 2) W/DEVICE KIT Use as directed 1 each 1  . carvedilol (COREG) 25 MG tablet Take 1 tablet by mouth two  times daily 180 tablet 1  . Cholecalciferol (VITAMIN D) 1000 UNITS capsule Take 1,000 Units by mouth daily. Reported on 02/15/2015    . fluticasone (FLONASE) 50 MCG/ACT nasal spray Use 2 sprays in each  nostril every day 48 g 1  . furosemide (LASIX) 80 MG tablet Take 1 tablet (80 mg total) by mouth 2 (two) times daily. 60 tablet 5  . gabapentin (NEURONTIN) 300 MG capsule Take 2 capsules by mouth 3  times daily 540 capsule 1  . glipiZIDE (GLUCOTROL XL) 10 MG 24 hr tablet Take 1 tablet by mouth  daily with breakfast 90 tablet 1  . glucose blood (ONE TOUCH ULTRA TEST) test strip Use to check blood sugar twice a day Dx E11.40. appt needed for refills 200 each 0  . HYDROcodone-acetaminophen (NORCO) 10-325 MG tablet Take 1-2 tablets by mouth every 4 (four) hours as needed. 80 tablet 0  . ibuprofen (ADVIL,MOTRIN) 200 MG tablet Take 800 mg by mouth daily as needed for mild pain or moderate pain.     Marland Kitchen  lisinopril (PRINIVIL,ZESTRIL) 2.5 MG tablet Take 1 tablet (2.5 mg total) by mouth daily. 90 tablet 3  . metFORMIN (GLUCOPHAGE) 500 MG tablet Take 2 tablets by mouth two times daily 360 tablet 1  . Multiple Vitamin (MULTIVITAMIN WITH MINERALS) TABS tablet Take 1 tablet by mouth every morning.    Marland Kitchen oxyCODONE-acetaminophen (PERCOCET/ROXICET) 5-325 MG per tablet Take 1-2 tablets by mouth every 4 (four) hours as needed for moderate pain. (Patient not Collier: Reported on 12/03/2014) 80 tablet 0  . potassium chloride SA (K-DUR,KLOR-CON) 20 MEQ tablet Take 1 tablet  by mouth  every evening 90 tablet 1  . sitaGLIPtin (JANUVIA) 100 MG tablet Take 1 tablet (100 mg total) by mouth daily. 90 tablet 3  . tamsulosin (FLOMAX) 0.4 MG CAPS capsule Take 1 capsule by mouth  every 12 hours 180 capsule 1  . tiZANidine (ZANAFLEX) 4 MG tablet TAKE 1 TABLET BY MOUTH  EVERY 6 HOURS AS NEEDED FOR MUSCLE SPASMS. 180 tablet 1  . traMADol (ULTRAM) 50 MG tablet Take 1 tablet (50 mg total) by mouth every 12 (twelve) hours as needed for moderate pain or severe pain. (Patient not Collier: Reported on 02/15/2015) 60 tablet 2  . warfarin (COUMADIN) 5 MG tablet Take 1 tablet (5 mg total) by mouth daily. (Patient Collier differently: Take 5 mg by mouth daily. Take 2.5 mg by mouth on Sunday, Tuesday, Thursday, Friday and Saturday. Take 37m by mouth Monday and Wednesday.) 90 tablet 3   Assessment:    Chronic Health Condition (left hip pain; trochanteric bursitis) - [name] back surgery on 12/03/14 and says he has had significant improvement in resolution of pain and has visible improvement in gait and mobility, He is Collier prescribed oral narcotic analgesics as needed but frequently only needs OTC NSAIDS for managaement  Chronic Health Condition (DM) - [name] to DM plan of care has improved drastically over the last month; he is managing his diet more closely and beginning an exercise routine again. Need to get updated HGA1C. Last available to me is 07/2014.  (see home monitoring images in media manager)  Chronic Health Condition (CHF) - [name] not had CHF exacerbation symptoms; he has no edema but mild soft tissue swelling in the right foot/ankle as per his baseline; [name] Lasix 832mBID as prescribed; he does not consistently adhere to a sodium restricted diet  Weights: 03/22/15 = 210# 02/15/15 = 210# 12/08/14 = 206# 11/19/14 = 210# 10/15/14 = 205# 09/16/14 = 210# 08/28/14 = 210# 07/21/14 = 226# 07/15/14 = 228# 06/18/14 = 220#  Chronic Health  Condition (HTN) - [name] pressure has been in provider recommended range for several months; he takes medications as prescribed and checks his bp daily with home monitor (see images in meEnvironmental health practitioner Medication Management Concerns - during our medication review last month, I noted that Mr. PiSadowskyad several medications in mislabeled bottles; he was able to immediately identify the medication and knew the prescribed dose and use of the medication. I spent some time re-labeling his bottles and explained the importance of keeping all medications in appropriately labeled containers.   Plan:   Mr. PiRemmersill keep all prescriptions in properly labeled bottles.   Mr. PiSchroeterill continue checking cbg's at least BID/recording, working on improved adherence to exercise regimen.    I will see Mr. PiStracket home next month for routine home visit and will likely transition to telephonic CM vs discharge.  [facility] CM Care Plan Problem One        Most Recent Value   Care Plan Problem One  Chronic Health Condition (DM) Self Health Management Goals not met   Role Documenting the Problem One  Care Management Coordinator   Care Plan for Problem One  Active   THN Long Term Goal (31-90 days)  Over the next 31 days, patient will verbalize and clearly articulate DM self health management plan   THN Long Term Goal Start Date  03/22/15   Interventions for Problem One Long Term Goal  Utilizing teachback method and working with patient to establish goals, developed self health management plan including nutrition and exercise plan, symptom management strategy using tools in [facility] CM notebook, and discussing needed annual exams related to diabetes care   THN CM Short Term Goal #1 (0-30 days)  Over the next 30 days patient will demonstrate understanding of carb modified diet as evidenced by his verbalization of diet and diet journal entries   [facility]N CM Short Term Goal #1 Start Date  03/22/15   Interventions  for Short Term Goal #1  Utilizing teachback method, explained to patient value of documenting previous meals if elevated CBG is noted, value of nutrition/diet journaling   THN CM Short Term Goal #2 (0-30 days)  Over the next 30 days, patient will maintain exercise regimen 38mn walk x 3 times weekly with plans to increase time and/or days/week according to pain management and mobility associated with back surgery recovery   [facility]N CM Short Term Goal #2 Start Date  03/22/15   Interventions for Short Term Goal #2  Utilizing teachback method and encouragement, reviewed value of routine exercise for benefit of diabetes management and surgical recovery    AJanalyn ShyMAlabama Digestive Health Endoscopy Center LLCTPacific Endoscopy CenterCare Management  [phone]

## 2015-03-23 ENCOUNTER — Other Ambulatory Visit: Payer: Self-pay | Admitting: Neurosurgery

## 2015-03-23 ENCOUNTER — Other Ambulatory Visit (HOSPITAL_COMMUNITY): Payer: Self-pay | Admitting: Neurosurgery

## 2015-03-23 ENCOUNTER — Encounter: Payer: Self-pay | Admitting: *Deleted

## 2015-03-23 DIAGNOSIS — M5416 Radiculopathy, lumbar region: Secondary | ICD-10-CM

## 2015-03-24 ENCOUNTER — Telehealth: Payer: Self-pay | Admitting: Pharmacist Clinician (PhC)/ Clinical Pharmacy Specialist

## 2015-03-24 NOTE — Telephone Encounter (Signed)
New message      Pt request a callback from [facility].

## 2015-03-24 NOTE — Telephone Encounter (Signed)
Patient still having back problems, has myelogram scheduled for Wed March 1.  Will need to hold warfarin starting Friday and have INR drawn Wednesday am prior to procedure.  Appt set.

## 2015-03-25 ENCOUNTER — Telehealth: Payer: Self-pay | Admitting: Pharmacist Clinician (PhC)/ Clinical Pharmacy Specialist

## 2015-03-25 NOTE — Telephone Encounter (Signed)
CHADS2 score of 3, per office protocol does not need lovenox bridging.  Forms signed and faxed back to [facility]

## 2015-03-25 NOTE — Telephone Encounter (Signed)
[name] is requesting that the clearance she faxed over asking if it was ok for the pt to hold his Warfarin to be faxed back to her at [phone]. Thanks

## 2015-03-28 ENCOUNTER — Other Ambulatory Visit: Payer: Self-pay | Admitting: Cardiology

## 2015-03-31 ENCOUNTER — Ambulatory Visit
Admission: RE | Admit: 2015-03-31 | Discharge: 2015-03-31 | Disposition: A | Discharge status: Home / Self Care | Payer: Medicare Other | Source: Ambulatory Visit | Attending: Neurosurgery | Admitting: Neurosurgery

## 2015-03-31 ENCOUNTER — Ambulatory Visit (INDEPENDENT_AMBULATORY_CARE_PROVIDER_SITE_OTHER): Payer: Medicare Other | Admitting: Pharmacist Clinician (PhC)/ Clinical Pharmacy Specialist

## 2015-03-31 VITALS — BP 119/63 | HR 75

## 2015-03-31 DIAGNOSIS — M5416 Radiculopathy, lumbar region: Secondary | ICD-10-CM

## 2015-03-31 DIAGNOSIS — I482 Chronic atrial fibrillation, unspecified: Secondary | ICD-10-CM

## 2015-03-31 DIAGNOSIS — M79604 Pain in right leg: Secondary | ICD-10-CM

## 2015-03-31 DIAGNOSIS — M5431 Sciatica, right side: Secondary | ICD-10-CM

## 2015-03-31 DIAGNOSIS — M5432 Sciatica, left side: Secondary | ICD-10-CM

## 2015-03-31 DIAGNOSIS — M4806 Spinal stenosis, lumbar region: Secondary | ICD-10-CM | POA: Diagnosis not present

## 2015-03-31 DIAGNOSIS — M48062 Spinal stenosis, lumbar region with neurogenic claudication: Secondary | ICD-10-CM

## 2015-03-31 DIAGNOSIS — M48061 Spinal stenosis, lumbar region without neurogenic claudication: Secondary | ICD-10-CM

## 2015-03-31 DIAGNOSIS — I483 Typical atrial flutter: Secondary | ICD-10-CM | POA: Diagnosis not present

## 2015-03-31 DIAGNOSIS — Z7901 Long term (current) use of anticoagulants: Secondary | ICD-10-CM

## 2015-03-31 DIAGNOSIS — M25552 Pain in left hip: Secondary | ICD-10-CM

## 2015-03-31 DIAGNOSIS — M545 Low back pain: Secondary | ICD-10-CM

## 2015-03-31 LAB — POCT INR: INR: 1.1

## 2015-03-31 MED ORDER — ONDANSETRON HCL 4 MG/2ML IJ SOLN
4.0000 mg | Freq: Once | INTRAMUSCULAR | Status: AC
  Administered 2015-03-31: 4 mg via INTRAMUSCULAR

## 2015-03-31 MED ORDER — MEPERIDINE HCL 100 MG/ML IJ SOLN
75.0000 mg | Freq: Once | INTRAMUSCULAR | Status: AC
  Administered 2015-03-31: 75 mg via INTRAMUSCULAR

## 2015-03-31 MED ORDER — IOHEXOL 180 MG/ML  SOLN
15.0000 mL | Freq: Once | INTRAMUSCULAR | Status: AC | PRN
  Administered 2015-03-31: 15 mL via INTRATHECAL

## 2015-03-31 MED ORDER — DIAZEPAM 5 MG PO TABS
5.0000 mg | ORAL_TABLET | Freq: Once | ORAL | Status: AC
  Administered 2015-03-31: 5 mg via ORAL

## 2015-03-31 NOTE — Discharge Instructions (Signed)

## 2015-03-31 NOTE — Progress Notes (Signed)
Patient states he has been off Coumadin for the past five days.  INR is 1.1 this morning.

## 2015-04-07 DIAGNOSIS — M5416 Radiculopathy, lumbar region: Secondary | ICD-10-CM | POA: Diagnosis not present

## 2015-04-13 ENCOUNTER — Encounter: Payer: Medicare Other | Admitting: Pharmacist Clinician (PhC)/ Clinical Pharmacy Specialist

## 2015-04-19 ENCOUNTER — Encounter: Payer: Self-pay | Admitting: *Deleted

## 2015-04-19 ENCOUNTER — Other Ambulatory Visit: Payer: Self-pay | Admitting: *Deleted

## 2015-04-19 NOTE — Addendum Note (Signed)
Addended by: Clerance Lav on: 04/19/2015 12:07 PM   Modules accepted: Medications

## 2015-04-19 NOTE — Patient Outreach (Addendum)
[facility]) Care Management   04/19/2015  [name] 1945-05-23 [id number]  [name] is an 70 y.o. male who is followed by [facility] for DM and HTN Disease Management and Care management services. I am seeing [name] today for routine home visit.   [name] had back surgery on 12/02/14 and has experienced drastic improvement in mobility and pain management. However, between his surgery, his wife's illnesses, holidays, and general "getting out of routine" [name] admits to dietary indiscretion and not monitoring cbg's as prescribed until last month. [name] did a great job of "getting back on the wagon" (his words) and has reinstituted an exercise regimen and is working diligently to make improvements in adherence to his prescribed carb modified diet. The evidence of his work is noted in his improved cbg's and stablized weight.   Subjective: "I'm doing pretty good except my back and my hip still hurt, especially when I wear this brace."   Objective:   Review of Systems  Constitutional: Negative.   HENT: Negative.   Eyes: Negative.   Respiratory: Negative.   Cardiovascular: Negative.   Gastrointestinal: Negative.   Genitourinary: Negative.   Musculoskeletal: Positive for myalgias and back pain. Negative for falls.  Skin: Negative.   Neurological: Negative.   Psychiatric/Behavioral: Negative.     Physical Exam  Constitutional: He is oriented to person, place, and time. Vital signs are normal. He appears well-developed and well-nourished. He is active.  Cardiovascular: Normal rate and regular rhythm.   Respiratory: Effort normal and breath sounds normal.  GI: Soft. Bowel sounds are normal.  Neurological: He is alert and oriented to person, place, and time.  Skin: Skin is warm, dry and intact.  Psychiatric: He has a normal mood and affect. His speech is normal and behavior is normal. Judgment and thought content normal. Cognition  and memory are normal.    Current Medications:   Current Outpatient Prescriptions  Medication Sig Dispense Refill  . allopurinol (ZYLOPRIM) 300 MG tablet take 1 tablet by mouth once daily (Patient taking differently: Take 300 mg by mouth daily. ) 90 tablet 3  . atorvastatin (LIPITOR) 80 MG tablet Take 1 tablet by mouth  daily 90 tablet 1  . Blood Glucose Monitoring Suppl (ONE TOUCH ULTRA 2) W/DEVICE KIT Use as directed 1 each 1  . carvedilol (COREG) 25 MG tablet Take 1 tablet by mouth two  times daily 180 tablet 1  . Cholecalciferol (VITAMIN D) 1000 UNITS capsule Take 1,000 Units by mouth daily. Reported on 02/15/2015    . fluticasone (FLONASE) 50 MCG/ACT nasal spray Use 2 sprays in each  nostril every day 48 g 1  . furosemide (LASIX) 40 MG tablet Take 1 tablet by mouth  daily 90 tablet 1  . furosemide (LASIX) 80 MG tablet Take 1 tablet (80 mg total) by mouth 2 (two) times daily. 60 tablet 5  . gabapentin (NEURONTIN) 300 MG capsule Take 2 capsules by mouth 3  times daily 540 capsule 1  . glipiZIDE (GLUCOTROL XL) 10 MG 24 hr tablet Take 1 tablet by mouth  daily with breakfast 90 tablet 1  . glucose blood (ONE TOUCH ULTRA TEST) test strip Use to check blood sugar twice a day Dx E11.40. appt needed for refills 200 each 0  . HYDROcodone-acetaminophen (NORCO) 10-325 MG tablet Take 1-2 tablets by mouth every 4 (four) hours as needed. 80 tablet 0  . ibuprofen (ADVIL,MOTRIN) 200 MG tablet Take 800 mg by  mouth daily as needed for mild pain or moderate pain.     Marland Kitchen lisinopril (PRINIVIL,ZESTRIL) 2.5 MG tablet Take 1 tablet (2.5 mg total) by mouth daily. 90 tablet 3  . metFORMIN (GLUCOPHAGE) 500 MG tablet Take 2 tablets by mouth two times daily 360 tablet 1  . Multiple Vitamin (MULTIVITAMIN WITH MINERALS) TABS tablet Take 1 tablet by mouth every morning.    Marland Kitchen oxyCODONE-acetaminophen (PERCOCET/ROXICET) 5-325 MG per tablet Take 1-2 tablets by mouth every 4 (four) hours as needed for moderate pain. 80 tablet 0   . potassium chloride SA (K-DUR,KLOR-CON) 20 MEQ tablet Take 1 tablet by mouth  every evening 90 tablet 1  . sitaGLIPtin (JANUVIA) 100 MG tablet Take 1 tablet (100 mg total) by mouth daily. 90 tablet 3  . tamsulosin (FLOMAX) 0.4 MG CAPS capsule Take 1 capsule by mouth  every 12 hours 180 capsule 1  . tiZANidine (ZANAFLEX) 4 MG tablet TAKE 1 TABLET BY MOUTH  EVERY 6 HOURS AS NEEDED FOR MUSCLE SPASMS. 180 tablet 1  . warfarin (COUMADIN) 5 MG tablet Take 1 tablet by mouth daily or as directed by coumadin clinic 90 tablet 1   No current facility-administered medications for this visit.    Functional Status:   In your present state of health, do you have any difficulty performing the following activities: 03/22/2015 12/03/2014  Hearing? N (No Data)  Vision? N -  Difficulty concentrating or making decisions? N -  Walking or climbing stairs? N -  Dressing or bathing? N -  Doing errands, shopping? N -  Preparing Food and eating ? - -  Using the Toilet? - -  In the past six months, have you accidently leaked urine? - -  Do you have problems with loss of bowel control? - -  Managing your Medications? - -  Managing your Finances? - -  Housekeeping or managing your Housekeeping? - -    Fall/Depression Screening:    PHQ 2/9 Scores 03/22/2015 12/03/2014 10/15/2014 09/16/2014 08/19/2014 05/05/2014 02/24/2014  PHQ - 2 Score 0 0 0 0 0 0 1    Assessment:  [name] has met major goals set for self health management. He is still working on diet and exercise but has all needed educational materials and has had months of ongoing face to face instruction. [name] does not wish to transition to telephonic case management.   Chronic Health Condition (left hip pain; trochanteric bursitis) - [name] had back surgery on 12/03/14 and says he has had significant improvement in resolution of pain and has visible improvement in gait and mobility, He is taking prescribed oral narcotic analgesics as needed but  frequently only needs OTC NSAIDS for managaement  Chronic Health Condition (DM) - [name] adherence to DM plan of care has improved drastically over the last month; he is managing his diet more closely and beginning an exercise routine again. Need to get updated HGA1C. Last available to me is 07/2014.  (see home monitoring images in media manager)  Chronic Health Condition (CHF) - Mr. Malacara has not had CHF exacerbation symptoms; he has no edema but mild soft tissue swelling in the right foot/ankle as per his baseline; Mr. Carne is taking Lasix 63m BID as prescribed; he does not consistently adhere to a sodium restricted diet  Weights: 04/19/15 = 200# 03/22/15 = 210# 02/15/15 = 210# 12/08/14 = 206# 11/19/14 = 210# 10/15/14 = 205# 09/16/14 = 210# 08/28/14 = 210# 07/21/14 = 226# 07/15/14 = 228# 06/18/14 =  220#  Chronic Health Condition (HTN) - Mr. Verdun blood pressure has been in provider recommended range for several months; he takes medications as prescribed and checks his bp daily with home monitor (see images in Environmental health practitioner)  Plan:   Case Closure.   [facility]  [phone]

## 2015-04-20 ENCOUNTER — Encounter: Payer: Self-pay | Admitting: *Deleted

## 2015-04-29 ENCOUNTER — Encounter: Payer: Self-pay | Admitting: Internal Medicine

## 2015-04-29 ENCOUNTER — Ambulatory Visit (INDEPENDENT_AMBULATORY_CARE_PROVIDER_SITE_OTHER): Payer: Medicare Other | Admitting: *Deleted

## 2015-04-29 DIAGNOSIS — I482 Chronic atrial fibrillation, unspecified: Secondary | ICD-10-CM

## 2015-04-29 LAB — CUP PACEART INCLINIC DEVICE CHECK
Date Time Interrogation Session: 20170330130353
Implantable Lead Implant Date: 20030730
Implantable Lead Location: 753859
Implantable Lead Model: 5076
Lead Channel Pacing Threshold Amplitude: 0.75 V
Lead Channel Pacing Threshold Pulse Width: 0.4 ms
Lead Channel Setting Pacing Amplitude: 2.5 V
Lead Channel Setting Pacing Pulse Width: 0.4 ms
MDC IDC LEAD IMPLANT DT: 20030730
MDC IDC LEAD LOCATION: 753860
MDC IDC MSMT BATTERY IMPEDANCE: 539 Ohm
MDC IDC MSMT BATTERY REMAINING LONGEVITY: 84 mo
MDC IDC MSMT BATTERY VOLTAGE: 2.78 V
MDC IDC MSMT LEADCHNL RV IMPEDANCE VALUE: 465 Ohm
MDC IDC SET LEADCHNL RV SENSING SENSITIVITY: 2.8 mV
MDC IDC STAT BRADY RV PERCENT PACED: 99.6 %

## 2015-04-29 NOTE — Progress Notes (Signed)
Pacemaker check in clinic. Normal device function. Threshold, sensing, and impedance consistent with previous measurements. Device programmed to maximize longevity. Permanent AF + warfarin. (5) high ventricular rates noted--max dur. 12bts, Max Avg V 160---NSVT likely. Device programmed at appropriate safety margins. Histogram distribution appropriate for patient activity level. Device programmed to optimize intrinsic conduction. Estimated longevity 7 years. Patient will follow up with GT/R in 6 months.

## 2015-05-10 ENCOUNTER — Telehealth: Payer: Self-pay | Admitting: Internal Medicine

## 2015-05-10 ENCOUNTER — Emergency Department (HOSPITAL_COMMUNITY)
Admission: EM | Admit: 2015-05-10 | Discharge: 2015-05-10 | Disposition: A | Discharge status: Home / Self Care | Payer: Medicare Other | Attending: Emergency Medicine | Admitting: Emergency Medicine

## 2015-05-10 ENCOUNTER — Encounter (HOSPITAL_COMMUNITY): Payer: Self-pay | Admitting: *Deleted

## 2015-05-10 DIAGNOSIS — Z7901 Long term (current) use of anticoagulants: Secondary | ICD-10-CM | POA: Diagnosis not present

## 2015-05-10 DIAGNOSIS — Z8546 Personal history of malignant neoplasm of prostate: Secondary | ICD-10-CM | POA: Diagnosis not present

## 2015-05-10 DIAGNOSIS — Z950 Presence of cardiac pacemaker: Secondary | ICD-10-CM | POA: Diagnosis not present

## 2015-05-10 DIAGNOSIS — Z87891 Personal history of nicotine dependence: Secondary | ICD-10-CM | POA: Diagnosis not present

## 2015-05-10 DIAGNOSIS — Z79899 Other long term (current) drug therapy: Secondary | ICD-10-CM | POA: Diagnosis not present

## 2015-05-10 DIAGNOSIS — E1140 Type 2 diabetes mellitus with diabetic neuropathy, unspecified: Secondary | ICD-10-CM | POA: Insufficient documentation

## 2015-05-10 DIAGNOSIS — I252 Old myocardial infarction: Secondary | ICD-10-CM | POA: Insufficient documentation

## 2015-05-10 DIAGNOSIS — E785 Hyperlipidemia, unspecified: Secondary | ICD-10-CM | POA: Diagnosis not present

## 2015-05-10 DIAGNOSIS — I2510 Atherosclerotic heart disease of native coronary artery without angina pectoris: Secondary | ICD-10-CM | POA: Diagnosis not present

## 2015-05-10 DIAGNOSIS — L03115 Cellulitis of right lower limb: Secondary | ICD-10-CM | POA: Diagnosis not present

## 2015-05-10 DIAGNOSIS — I110 Hypertensive heart disease with heart failure: Secondary | ICD-10-CM | POA: Diagnosis not present

## 2015-05-10 DIAGNOSIS — Z7951 Long term (current) use of inhaled steroids: Secondary | ICD-10-CM | POA: Insufficient documentation

## 2015-05-10 DIAGNOSIS — I5032 Chronic diastolic (congestive) heart failure: Secondary | ICD-10-CM | POA: Diagnosis not present

## 2015-05-10 DIAGNOSIS — L03116 Cellulitis of left lower limb: Secondary | ICD-10-CM | POA: Diagnosis not present

## 2015-05-10 DIAGNOSIS — Z7984 Long term (current) use of oral hypoglycemic drugs: Secondary | ICD-10-CM | POA: Insufficient documentation

## 2015-05-10 LAB — CBC WITH DIFFERENTIAL/PLATELET
BASOS PCT: 0 %
Basophils Absolute: 0 10*3/uL (ref 0.0–0.1)
EOS ABS: 0.1 10*3/uL (ref 0.0–0.7)
EOS PCT: 3 %
HCT: 30.6 % — ABNORMAL LOW (ref 39.0–52.0)
HEMOGLOBIN: 9.9 g/dL — AB (ref 13.0–17.0)
LYMPHS ABS: 1.4 10*3/uL (ref 0.7–4.0)
Lymphocytes Relative: 28 %
MCH: 29.1 pg (ref 26.0–34.0)
MCHC: 32.4 g/dL (ref 30.0–36.0)
MCV: 90 fL (ref 78.0–100.0)
MONO ABS: 0.4 10*3/uL (ref 0.1–1.0)
MONOS PCT: 8 %
NEUTROS PCT: 61 %
Neutro Abs: 3.1 10*3/uL (ref 1.7–7.7)
Platelets: 153 10*3/uL (ref 150–400)
RBC: 3.4 MIL/uL — ABNORMAL LOW (ref 4.22–5.81)
RDW: 15.6 % — AB (ref 11.5–15.5)
WBC: 5.1 10*3/uL (ref 4.0–10.5)

## 2015-05-10 LAB — CBG MONITORING, ED: GLUCOSE-CAPILLARY: 69 mg/dL (ref 65–99)

## 2015-05-10 MED ORDER — LIDOCAINE HCL (PF) 1 % IJ SOLN
2.1000 mL | Freq: Once | INTRAMUSCULAR | Status: AC
  Administered 2015-05-10: 2.1 mL

## 2015-05-10 MED ORDER — SULFAMETHOXAZOLE-TRIMETHOPRIM 800-160 MG PO TABS: 1.0000 | ORAL_TABLET | Freq: Two times a day (BID) | ORAL | Status: AC

## 2015-05-10 MED ORDER — LIDOCAINE HCL (PF) 1 % IJ SOLN
INTRAMUSCULAR | Status: AC
  Administered 2015-05-10: 2.1 mL
  Filled 2015-05-10: qty 5

## 2015-05-10 MED ORDER — CEFTRIAXONE SODIUM 1 G IJ SOLR
1.0000 g | Freq: Once | INTRAMUSCULAR | Status: AC
  Administered 2015-05-10: 1 g via INTRAMUSCULAR
  Filled 2015-05-10: qty 10

## 2015-05-10 MED ORDER — CEPHALEXIN 500 MG PO CAPS: 500.0000 mg | ORAL_CAPSULE | Freq: Four times a day (QID) | ORAL | Status: DC

## 2015-05-10 NOTE — Discharge Instructions (Signed)
Local wound care with triple antibiotic ointment and dressing changes twice daily.  Keflex and Bactrim as prescribed.  Return to the emergency department if symptoms worsen, you develop high fever, or other new and concerning symptoms.   Cellulitis Cellulitis is an infection of the skin and the tissue beneath it. The infected area is usually red and tender. Cellulitis occurs most often in the arms and lower legs.  CAUSES  Cellulitis is caused by bacteria that enter the skin through cracks or cuts in the skin. The most common types of bacteria that cause cellulitis are staphylococci and streptococci. SIGNS AND SYMPTOMS   Redness and warmth.  Swelling.  Tenderness or pain.  Fever. DIAGNOSIS  Your health care provider can usually determine what is wrong based on a physical exam. Blood tests may also be done. TREATMENT  Treatment usually involves taking an antibiotic medicine. HOME CARE INSTRUCTIONS   Take your antibiotic medicine as directed by your health care provider. Finish the antibiotic even if you start to feel better.  Keep the infected arm or leg elevated to reduce swelling.  Apply a warm cloth to the affected area up to 4 times per day to relieve pain.  Take medicines only as directed by your health care provider.  Keep all follow-up visits as directed by your health care provider. SEEK MEDICAL CARE IF:   You notice red streaks coming from the infected area.  Your red area gets larger or turns dark in color.  Your bone or joint underneath the infected area becomes painful after the skin has healed.  Your infection returns in the same area or another area.  You notice a swollen bump in the infected area.  You develop new symptoms.  You have a fever. SEEK IMMEDIATE MEDICAL CARE IF:   You feel very sleepy.  You develop vomiting or diarrhea.  You have a general ill feeling (malaise) with muscle aches and pains.   This information is not intended to replace  advice given to you by your health care provider. Make sure you discuss any questions you have with your health care provider.   Document Released: 10/26/2004 Document Revised: 10/07/2014 Document Reviewed: 04/03/2011 Elsevier Interactive Patient Education [employer].

## 2015-05-10 NOTE — ED Notes (Signed)
Pt was sent here by PCP for wound evaluation. Pt states his would is malodorous and red. Upon triage area is wrapped.

## 2015-05-10 NOTE — Telephone Encounter (Signed)
This pt called in to schedule with [name]. He stated "the meat around his legs are falling off". I asked him to repeat himself and he said the "the area around his ankles smells really bad". I tried to send him to team health nurse line and he refused and only wants to see [name]. I scheduled him for tomorrow evening at 5. Which was his next available.  I'm wondering if you could maybe reach out to him to see if he may need to go to the ER.

## 2015-05-10 NOTE — Telephone Encounter (Signed)
i called and talked with patient and patient's wife----i advised i really think patient is needing IV meds which requires hospital visit---patient stated he would go to [facility]

## 2015-05-10 NOTE — ED Notes (Signed)
Pt made aware to return if symptoms worsen or if any life threatening symptoms occur.   

## 2015-05-10 NOTE — ED Notes (Signed)
Leg wrapped with telfa and gauze.

## 2015-05-10 NOTE — ED Provider Notes (Signed)
CSN: [id number]     Arrival date & time 05/10/15  1203 History   First MD Initiated Contact with Patient 05/10/15 1239     Chief Complaint  Patient presents with  . Wound Infection     (Consider location/radiation/quality/duration/timing/severity/associated sxs/prior Treatment) HPI Comments: Patient is a 70 year old male with history of type 2 diabetes. He presents for evaluation of swelling, redness, and discomfort to the inside of his right lower leg. This began in the absence of any injury or trauma. He has been putting triple antibiotic ointment on it with little relief.  The history is provided by the patient.    Past Medical History  Diagnosis Date  . 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 HYPERLIPIDEMIA 08/29/2006    takes Atorvastatin daily  . GOUT 04/22/2007    takes Allopurinol daily  . DEPRESSION 08/29/2006  . 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 DIASTOLIC HEART FAILURE, CHRONIC 06/09/2008  . BENIGN PROSTATIC HYPERTROPHY 08/29/2006    takes Flomax daily  . LUMBAR RADICULOPATHY, RIGHT 06/10/2007  . INSOMNIA-SLEEP DISORDER-UNSPEC 10/23/2007  . PACEMAKER, PERMANENT 03/04/2008    pt denies this date  . Left lumbar radiculopathy 05/30/2010  . Sleep apnea     "if I lay flat I quit breathing; HOB up I'm fine"  . Arthritis     "lower back; going back down both my sciatic nerves"  . Aortic root dilatation ([place]) 02/02/2011  . HYPERTENSION 08/29/2006    takes Lisinopril daily  . Myocardial infarction ([place]) 12/27/10    "I've had several MIs"  . Muscle spasm     takes Zanaflex daily  . CHF (congestive heart failure) (HCC)     takes Lasix daily  . Shortness of breath dyspnea     "all my life" with exertion  . History of migraine     48yrs ago  . DIABETES MELLITUS, TYPE II 08/29/2006    takes Metformin,Januvia,and Glipizide  daily  . CORONARY ARTERY DISEASE 08/29/2006    takes Coumadin daily  . 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 Peripheral neuropathy (HCC)      takes Gabapentin daily  . Chronic back pain     HNP   . Cataracts, bilateral     immature  . Presence of permanent cardiac pacemaker    Past Surgical History  Procedure Laterality Date  . S/p left arm surgury after work accident  1991    "2000# steel fell on it"  . S/p right hand surgury for foreign object  1970's    "piece of wood went in my hand; had to get that out"  . Insert / replace / remove pacemaker  ~ 2004    initial pacemaker placement  . Insert / replace / remove pacemaker  10/2009    generator change  . Colonoscopy    . Esophagogastroduodenoscopy N/A 12/01/2013    Procedure: ESOPHAGOGASTRODUODENOSCOPY (EGD);  Surgeon: Lafayette Dragon, MD;  Location: [facility];  Service: Endoscopy;  Laterality: N/A;  . Coronary artery bypass graft  1992    CABG X 2  . Coronary angioplasty with stent placement  12/27/10    "I've had a total of 9 cardiac stents put in"  . Lumbar laminectomy/decompression microdiscectomy Right 03/23/2014    Procedure: LAMINECTOMY AND FORAMINOTOMY RIGHT LUMBAR THREE-FOUR,LUMBAR FOUR-FIVE, LUMBAR FIVE-SACRAL ONE;  Surgeon: Charlie Pitter, MD;  Location: [facility];  Service: Neurosurgery;  Laterality: Right;  right  . Lumbar laminectomy/decompression microdiscectomy Left 12/01/2014    Procedure: Left Lumbar Three-Four, Lumbar  Four-Five Laminectomy and Foraminotomy;  Surgeon: Earnie Larsson, MD;  Location: [facility];  Service: Neurosurgery;  Laterality: Left;   Family History  Problem Relation Age of Onset  . Diabetes Mother   . Diabetes Sister   . Heart disease Sister     2 sister died with heart disease  . Coronary artery disease Other 22    male, first degree relative  . Diabetes Other     1st degree relative  . Heart disease Sister   . Lung cancer Sister     deceased   Social History  Substance Use Topics  . Smoking status: Former Smoker -- 3.00 packs/day for 9 years    Types: Cigarettes    Quit date: 02/26/1973  . Smokeless tobacco: Former  Systems developer     Comment: quit smoking 1yrs ago  . Alcohol Use: No    Review of Systems  All other systems reviewed and are negative.     Allergies  Crestor  Home Medications   Prior to Admission medications   Medication Sig Start Date End Date Taking? Authorizing Provider  allopurinol (ZYLOPRIM) 300 MG tablet take 1 tablet by mouth once daily Patient taking differently: Take 300 mg by mouth daily.  03/03/14  Yes Biagio Borg, MD  atorvastatin (LIPITOR) 80 MG tablet Take 1 tablet by mouth  daily 03/15/15  Yes Biagio Borg, MD  carvedilol (COREG) 25 MG tablet Take 1 tablet by mouth two  times daily 03/15/15  Yes Biagio Borg, MD  Cholecalciferol (VITAMIN D) 1000 UNITS capsule Take 1,000 Units by mouth daily. Reported on 02/15/2015 07/31/13  Yes Historical Provider, MD  fluticasone Asencion Islam) 50 MCG/ACT nasal spray Use 2 sprays in each  nostril every day 03/15/15  Yes Biagio Borg, MD  furosemide (LASIX) 40 MG tablet Take 1 tablet by mouth  daily 03/15/15  Yes Biagio Borg, MD  furosemide (LASIX) 80 MG tablet Take 1 tablet (80 mg total) by mouth 2 (two) times daily. 07/15/14  Yes Biagio Borg, MD  gabapentin (NEURONTIN) 300 MG capsule Take 2 capsules by mouth 3  times daily 03/15/15  Yes Biagio Borg, MD  glipiZIDE (GLUCOTROL XL) 10 MG 24 hr tablet Take 1 tablet by mouth  daily with breakfast 03/15/15  Yes Biagio Borg, MD  HYDROcodone-acetaminophen [facility]) 10-325 MG tablet Take 1-2 tablets by mouth every 4 (four) hours as needed. 12/02/14  Yes Earnie Larsson, MD  ibuprofen (ADVIL,MOTRIN) 200 MG tablet Take 800 mg by mouth daily as needed for mild pain or moderate pain.    Yes Historical Provider, MD  lisinopril (PRINIVIL,ZESTRIL) 2.5 MG tablet Take 1 tablet (2.5 mg total) by mouth daily. 03/17/15  Yes Biagio Borg, MD  metFORMIN (GLUCOPHAGE) 500 MG tablet Take 2 tablets by mouth two times daily 03/15/15  Yes Biagio Borg, MD  Multiple Vitamin (MULTIVITAMIN WITH MINERALS) TABS tablet Take 1 tablet by mouth every  morning.   Yes Historical Provider, MD  potassium chloride SA (K-DUR,KLOR-CON) 20 MEQ tablet Take 1 tablet by mouth  every evening 03/15/15  Yes Biagio Borg, MD  sitaGLIPtin (JANUVIA) 100 MG tablet Take 1 tablet (100 mg total) by mouth daily. 02/24/14  Yes Biagio Borg, MD  tamsulosin [facility]) 0.4 MG CAPS capsule Take 1 capsule by mouth  every 12 hours 03/15/15  Yes Biagio Borg, MD  tiZANidine (ZANAFLEX) 4 MG tablet TAKE 1 TABLET BY MOUTH  EVERY 6 HOURS AS NEEDED FOR MUSCLE  SPASMS. 03/15/15  Yes Biagio Borg, MD  warfarin (COUMADIN) 5 MG tablet Take 1 tablet by mouth daily or as directed by coumadin clinic Patient taking differently: Take 5 mg by mouth. Take 1 tablet by mouth daily on Monday ,Wednesday, Friday. 2.5 mg Tuesday, Thursday, Saturday, Sunday. 03/30/15  Yes [name], MD   BP 125/72 mmHg  Pulse 86  Temp(Src) 97.8 F (36.6 C) (Oral)  Resp 15  Ht 5\' 11"  (1.803 m)  Wt 200 lb (90.719 kg)  BMI 27.91 kg/m2  SpO2 96% Physical Exam  Constitutional: He is oriented to person, place, and time. He appears well-developed and well-nourished. No distress.  HENT:  Head: Normocephalic and atraumatic.  Neck: Normal range of motion. Neck supple.  Neurological: He is alert and oriented to person, place, and time.  Skin: Skin is warm and dry. He is not diaphoretic.  The medial aspect of his right lower leg is noted to have an area of skin breakdown, erythema, and warmth just above the medial malleolus of the ankle. There is slight streaking into the foot. DP pulses are easily palpable. He has free range of motion of the toes and sensation is intact.  Nursing note and vitals reviewed.   ED Course  Procedures (including critical care time) Labs Review Labs Reviewed  CBC WITH DIFFERENTIAL/PLATELET - Abnormal; Notable for the following:    RBC 3.40 (*)    Hemoglobin 9.9 (*)    HCT 30.6 (*)    RDW 15.6 (*)    All other components within normal limits  CBG MONITORING, ED    Imaging  Review No results found. I have personally reviewed and evaluated these images and lab results as part of my medical decision-making.   EKG Interpretation None      MDM   Final diagnoses:  None    This appears to be a mild cellulitis to the right lower leg. Due to his history of diabetes, he will be given an IM dose of Rocephin along with discharged with Keflex and Bactrim. He is to return as needed for any problems.    Veryl Speak, MD 05/10/15 1318

## 2015-05-11 ENCOUNTER — Telehealth: Payer: Self-pay | Admitting: Pharmacist Clinician (PhC)/ Clinical Pharmacy Specialist

## 2015-05-11 ENCOUNTER — Ambulatory Visit: Payer: Medicare Other | Admitting: Internal Medicine

## 2015-05-11 DIAGNOSIS — Z0289 Encounter for other administrative examinations: Secondary | ICD-10-CM

## 2015-05-11 NOTE — Telephone Encounter (Signed)
Patient dx with cellulitis, given bactrim ds 1 bid x 7 days and cephalexin 500 mg qid x 7 days.   Moved INR check to this Friday.  Patient voiced understanding

## 2015-05-13 ENCOUNTER — Encounter: Payer: Self-pay | Admitting: Internal Medicine

## 2015-05-13 ENCOUNTER — Telehealth: Payer: Self-pay | Admitting: *Deleted

## 2015-05-13 ENCOUNTER — Ambulatory Visit (INDEPENDENT_AMBULATORY_CARE_PROVIDER_SITE_OTHER): Payer: Medicare Other | Admitting: Internal Medicine

## 2015-05-13 ENCOUNTER — Ambulatory Visit (INDEPENDENT_AMBULATORY_CARE_PROVIDER_SITE_OTHER): Payer: Medicare Other | Admitting: Pharmacist Clinician (PhC)/ Clinical Pharmacy Specialist

## 2015-05-13 VITALS — BP 104/70 | HR 63 | Temp 97.8°F | Wt 217.0 lb

## 2015-05-13 DIAGNOSIS — I483 Typical atrial flutter: Secondary | ICD-10-CM | POA: Diagnosis not present

## 2015-05-13 DIAGNOSIS — I10 Essential (primary) hypertension: Secondary | ICD-10-CM

## 2015-05-13 DIAGNOSIS — E1165 Type 2 diabetes mellitus with hyperglycemia: Secondary | ICD-10-CM

## 2015-05-13 DIAGNOSIS — IMO0002 Reserved for concepts with insufficient information to code with codable children: Secondary | ICD-10-CM

## 2015-05-13 DIAGNOSIS — I482 Chronic atrial fibrillation, unspecified: Secondary | ICD-10-CM

## 2015-05-13 DIAGNOSIS — E1140 Type 2 diabetes mellitus with diabetic neuropathy, unspecified: Secondary | ICD-10-CM

## 2015-05-13 DIAGNOSIS — Z7901 Long term (current) use of anticoagulants: Secondary | ICD-10-CM | POA: Diagnosis not present

## 2015-05-13 DIAGNOSIS — L03115 Cellulitis of right lower limb: Secondary | ICD-10-CM | POA: Diagnosis not present

## 2015-05-13 LAB — POCT INR: INR: 2.6

## 2015-05-13 MED ORDER — SITAGLIPTIN PHOSPHATE 100 MG PO TABS: 100.0000 mg | ORAL_TABLET | Freq: Every day | ORAL | Status: DC

## 2015-05-13 MED ORDER — ONETOUCH DELICA LANCETS 33G MISC: Status: DC

## 2015-05-13 NOTE — Assessment & Plan Note (Signed)
stable overall by history and exam, recent data reviewed with pt, and pt to continue medical treatment as before,  to f/u any worsening symptoms or concerns BP Readings from Last 3 Encounters:  05/13/15 104/70  05/10/15 128/86  04/19/15 110/66

## 2015-05-13 NOTE — Progress Notes (Signed)
Pre visit review using our clinic review tool, if applicable. No additional management support is needed unless otherwise documented below in the visit note. 

## 2015-05-13 NOTE — Telephone Encounter (Signed)
Pt is needing renewal for his Januvia through [employer] patient assistance.

## 2015-05-13 NOTE — Progress Notes (Signed)
Subjective:    Patient ID: [name], male    DOB: 1945/03/01, 70 y.o.   MRN: [id number]  HPI  Here to f/u recent ED visit apr 10 with RLE cellulitis, overall afeb since then per pt, with less red/pain but still overall calf and leg swollen, not sure if that part is improved.  No trauma.  Doing daily dressing changes.  Currently both septra DS and cephalexin, both for total 7 days.  Pt denies chest pain, increased sob or doe, wheezing, orthopnea, PND, increased LE swelling, palpitations, dizziness or syncope.  Pt denies new neurological symptoms such as new headache, or facial or extremity weakness or numbness   Pt denies polydipsia, polyuria, No calf pain per se despite the swelling,.  Trying to keep the leg elevated most of the time, still some weepiness noted today.  Pt denies fever, wt loss, night sweats, loss of appetite, or other constitutional symptoms  Past Medical History  Diagnosis Date  . 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 HYPERLIPIDEMIA 08/29/2006    takes Atorvastatin daily  . GOUT 04/22/2007    takes Allopurinol daily  . DEPRESSION 08/29/2006  . 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 DIASTOLIC HEART FAILURE, CHRONIC 06/09/2008  . BENIGN PROSTATIC HYPERTROPHY 08/29/2006    takes Flomax daily  . LUMBAR RADICULOPATHY, RIGHT 06/10/2007  . INSOMNIA-SLEEP DISORDER-UNSPEC 10/23/2007  . PACEMAKER, PERMANENT 03/04/2008    pt denies this date  . Left lumbar radiculopathy 05/30/2010  . Sleep apnea     "if I lay flat I quit breathing; HOB up I'm fine"  . Arthritis     "lower back; going back down both my sciatic nerves"  . Aortic root dilatation ([place]) 02/02/2011  . HYPERTENSION 08/29/2006    takes Lisinopril daily  . Myocardial infarction ([place]) 12/27/10    "I've had several MIs"  . Muscle spasm     takes Zanaflex daily  . CHF (congestive heart failure) (HCC)     takes Lasix daily  . Shortness of breath dyspnea     "all my life" with exertion  . History of  migraine     16yrs ago  . DIABETES MELLITUS, TYPE II 08/29/2006    takes Metformin,Januvia,and Glipizide  daily  . CORONARY ARTERY DISEASE 08/29/2006    takes Coumadin daily  . 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 Peripheral neuropathy (HCC)     takes Gabapentin daily  . Chronic back pain     HNP   . Cataracts, bilateral     immature  . Presence of permanent cardiac pacemaker    Past Surgical History  Procedure Laterality Date  . S/p left arm surgury after work accident  1991    "2000# steel fell on it"  . S/p right hand surgury for foreign object  1970's    "piece of wood went in my hand; had to get that out"  . Insert / replace / remove pacemaker  ~ 2004    initial pacemaker placement  . Insert / replace / remove pacemaker  10/2009    generator change  . Colonoscopy    . Esophagogastroduodenoscopy N/A 12/01/2013    Procedure: ESOPHAGOGASTRODUODENOSCOPY (EGD);  Surgeon: Lafayette Dragon, MD;  Location: [facility];  Service: Endoscopy;  Laterality: N/A;  . Coronary artery bypass graft  1992    CABG X 2  . Coronary angioplasty with stent placement  12/27/10    "I've had a total of 9 cardiac stents put in"  . Lumbar  laminectomy/decompression microdiscectomy Right 03/23/2014    Procedure: LAMINECTOMY AND FORAMINOTOMY RIGHT LUMBAR THREE-FOUR,LUMBAR FOUR-FIVE, LUMBAR FIVE-SACRAL ONE;  Surgeon: Charlie Pitter, MD;  Location: [facility];  Service: Neurosurgery;  Laterality: Right;  right  . Lumbar laminectomy/decompression microdiscectomy Left 12/01/2014    Procedure: Left Lumbar Three-Four, Lumbar Four-Five Laminectomy and Foraminotomy;  Surgeon: Earnie Larsson, MD;  Location: [facility];  Service: Neurosurgery;  Laterality: Left;    reports that he quit smoking about 42 years ago. His smoking use included Cigarettes. He has a 27 pack-year smoking history. He has quit using smokeless tobacco. He reports that he does not drink alcohol or use illicit drugs. family history includes Coronary  artery disease (age of onset: 70) in his other; Diabetes in his mother, other, and sister; Heart disease in his sister and sister; Lung cancer in his sister. Allergies  Allergen Reactions  . Crestor [Rosuvastatin Calcium] Other (See Comments)    Urination of blood   Current Outpatient Prescriptions on File Prior to Visit  Medication Sig Dispense Refill  . allopurinol (ZYLOPRIM) 300 MG tablet take 1 tablet by mouth once daily (Patient taking differently: Take 300 mg by mouth daily. ) 90 tablet 3  . atorvastatin (LIPITOR) 80 MG tablet Take 1 tablet by mouth  daily 90 tablet 1  . carvedilol (COREG) 25 MG tablet Take 1 tablet by mouth two  times daily 180 tablet 1  . cephALEXin (KEFLEX) 500 MG capsule Take 1 capsule (500 mg total) by mouth 4 (four) times daily. 28 capsule 0  . Cholecalciferol (VITAMIN D) 1000 UNITS capsule Take 1,000 Units by mouth daily. Reported on 02/15/2015    . fluticasone (FLONASE) 50 MCG/ACT nasal spray Use 2 sprays in each  nostril every day 48 g 1  . furosemide (LASIX) 40 MG tablet Take 1 tablet by mouth  daily 90 tablet 1  . furosemide (LASIX) 80 MG tablet Take 1 tablet (80 mg total) by mouth 2 (two) times daily. 60 tablet 5  . gabapentin (NEURONTIN) 300 MG capsule Take 2 capsules by mouth 3  times daily 540 capsule 1  . glipiZIDE (GLUCOTROL XL) 10 MG 24 hr tablet Take 1 tablet by mouth  daily with breakfast 90 tablet 1  . HYDROcodone-acetaminophen (NORCO) 10-325 MG tablet Take 1-2 tablets by mouth every 4 (four) hours as needed. 80 tablet 0  . ibuprofen (ADVIL,MOTRIN) 200 MG tablet Take 800 mg by mouth daily as needed for mild pain or moderate pain.     Marland Kitchen lisinopril (PRINIVIL,ZESTRIL) 2.5 MG tablet Take 1 tablet (2.5 mg total) by mouth daily. 90 tablet 3  . metFORMIN (GLUCOPHAGE) 500 MG tablet Take 2 tablets by mouth two times daily 360 tablet 1  . Multiple Vitamin (MULTIVITAMIN WITH MINERALS) TABS tablet Take 1 tablet by mouth every morning.    . potassium chloride SA  (K-DUR,KLOR-CON) 20 MEQ tablet Take 1 tablet by mouth  every evening 90 tablet 1  . sitaGLIPtin (JANUVIA) 100 MG tablet Take 1 tablet (100 mg total) by mouth daily. 90 tablet 3  . sulfamethoxazole-trimethoprim (BACTRIM DS,SEPTRA DS) 800-160 MG tablet Take 1 tablet by mouth 2 (two) times daily. 14 tablet 0  . tamsulosin (FLOMAX) 0.4 MG CAPS capsule Take 1 capsule by mouth  every 12 hours 180 capsule 1  . tiZANidine (ZANAFLEX) 4 MG tablet TAKE 1 TABLET BY MOUTH  EVERY 6 HOURS AS NEEDED FOR MUSCLE SPASMS. 180 tablet 1  . warfarin (COUMADIN) 5 MG tablet Take 1 tablet by  mouth daily or as directed by coumadin clinic (Patient taking differently: Take 5 mg by mouth. Take 1 tablet by mouth daily on Monday ,Wednesday, Friday. 2.5 mg Tuesday, Thursday, Saturday, Sunday.) 90 tablet 1   No current facility-administered medications on file prior to visit.   Review of Systems  Constitutional: Negative for unusual diaphoresis or night sweats HENT: Negative for ear swelling or discharge Eyes: Negative for worsening visual haziness  Respiratory: Negative for choking and stridor.   Gastrointestinal: Negative for distension or worsening eructation Genitourinary: Negative for retention or change in urine volume.  Musculoskeletal: Negative for other MSK pain or swelling Skin: Negative for color change and worsening wound Neurological: Negative for tremors and numbness other than noted  Psychiatric/Behavioral: Negative for decreased concentration or agitation other than above       Objective:   Physical Exam BP 104/70 mmHg  Pulse 63  Temp(Src) 97.8 F (36.6 C) (Oral)  Wt 217 lb (98.431 kg)  SpO2 93% VS noted,  Constitutional: Pt appears in no apparent distress HENT: Head: NCAT.  Right Ear: External ear normal.  Left Ear: External ear normal.  Eyes: . Pupils are equal, round, and reactive to light. Conjunctivae and EOM are normal Neck: Normal range of motion. Neck supple.  Cardiovascular: Normal rate  and regular rhythm.   Pulmonary/Chest: Effort normal and breath sounds without rales or wheezing.  Neurological: Pt is alert. Not confused , motor grossly intact Skin: Skin is warm. No rash, diffuse RLE edema 2+ to calf o/w neurovasc intact, with 6 x 4 cm area distal leg pretibial with mild erythema, swelling and slight weepiness, no ulcers , wounds or red streaks; also s/p rle saphenous vein stripping Psychiatric: Pt behavior is normal. No agitation.      Assessment & Plan:

## 2015-05-13 NOTE — Patient Instructions (Signed)
OK to use neosporin (or triple antibiotic) ointment to the area of the leg, and the NON stick pads close to the skin to cover this.  Then use other gauze and tape to cover that  Please finish the antibiotics as you have  Please continue all other medications as before, and refills have been done if requested.  Please have the pharmacy call with any other refills you may need.  Please keep your appointments with your specialists as you may have planned

## 2015-05-13 NOTE — Assessment & Plan Note (Signed)
Improving, for finish dual antibx, add neosporin to daily dressing changes,  to f/u any worsening symptoms or concerns

## 2015-05-13 NOTE — Assessment & Plan Note (Signed)
stable overall by history and exam, recent data reviewed with pt, and pt to continue medical treatment as before,  to f/u any worsening symptoms or concerns Lab Results  Component Value Date   HGBA1C 8.1* 02/24/2014

## 2015-05-14 ENCOUNTER — Encounter: Payer: Medicare Other | Admitting: Pharmacist Clinician (PhC)/ Clinical Pharmacy Specialist

## 2015-05-17 NOTE — Telephone Encounter (Signed)
Patient Assistance is usually done by the [facility]

## 2015-05-17 NOTE — Telephone Encounter (Signed)
Is this something that is supposed to go to you or do I or [name] need to

## 2015-05-18 NOTE — Telephone Encounter (Signed)
Unable to reach patient, left message stating that patient needed to bring form in to be filled out for the patient assistance

## 2015-05-18 NOTE — Telephone Encounter (Signed)
Pt is calling back concerning his medication Januvia from patient assistance. He is requesting call back today will be out of medication soon!...[name]

## 2015-05-20 ENCOUNTER — Telehealth: Payer: Self-pay | Admitting: Internal Medicine

## 2015-05-20 NOTE — Telephone Encounter (Signed)
Delrae Rend is calling to advise she was told by patient the following... Patient's leg is much worse, swollen and painful. Warm   Please advise.

## 2015-05-20 NOTE — Telephone Encounter (Signed)
Please advise on what to do as [name] is out of the office

## 2015-05-21 ENCOUNTER — Ambulatory Visit (INDEPENDENT_AMBULATORY_CARE_PROVIDER_SITE_OTHER): Payer: Medicare Other | Admitting: Family

## 2015-05-21 ENCOUNTER — Encounter: Payer: Self-pay | Admitting: Family

## 2015-05-21 VITALS — BP 120/70 | HR 71 | Temp 97.8°F | Resp 16 | Ht 71.0 in | Wt 213.0 lb

## 2015-05-21 DIAGNOSIS — L03115 Cellulitis of right lower limb: Secondary | ICD-10-CM | POA: Diagnosis not present

## 2015-05-21 NOTE — Telephone Encounter (Signed)
Patient is scheduled for office visit today. If there is immediate concern recommend going to the emergency room.

## 2015-05-21 NOTE — Patient Instructions (Signed)
Thank you for choosing [facility].  Summary/Instructions:  It is recommended for you to go to the Emergency Room given the continued worsening.   Keep the wound clean with soap and water.  If your symptoms worsen or fail to improve, please contact our office for further instruction, or in case of emergency go directly to the emergency room at the closest medical facility.

## 2015-05-21 NOTE — Progress Notes (Signed)
Pre visit review using our clinic review tool, if applicable. No additional management support is needed unless otherwise documented below in the visit note. 

## 2015-05-21 NOTE — Progress Notes (Signed)
Subjective:    Patient ID: [name], male    DOB: 1945-05-11, 70 y.o.   MRN: [id number]  Chief Complaint  Patient presents with  . Cellulitis    right leg cellulitis that progressively gotten worse, it is now leaking fluid, foot is swelling    HPI:  [name] is a 70 y.o. male who  has a past medical history of NEOPLASM, MALIGNANT, PROSTATE (11/26/2006); HYPERLIPIDEMIA (08/29/2006); GOUT (04/22/2007); DEPRESSION (08/29/2006); Atrioventricular block, complete ([place]) ([id number]); DIASTOLIC HEART FAILURE, CHRONIC (06/09/2008); BENIGN PROSTATIC HYPERTROPHY (08/29/2006); LUMBAR RADICULOPATHY, RIGHT (06/10/2007); INSOMNIA-SLEEP DISORDER-UNSPEC (10/23/2007); PACEMAKER, PERMANENT (03/04/2008); Left lumbar radiculopathy (05/30/2010); Sleep apnea; Arthritis; Aortic root dilatation ([place]) (02/02/2011); HYPERTENSION (08/29/2006); Myocardial infarction [facility]) (12/27/10); Muscle spasm; CHF (congestive heart failure) ([place]); Shortness of breath dyspnea; History of migraine; DIABETES MELLITUS, TYPE II (08/29/2006); CORONARY ARTERY DISEASE (08/29/2006); CAD, AUTOLOGOUS BYPASS GRAFT (03/04/2008); Peripheral neuropathy ([place]); Chronic back pain; Cataracts, bilateral; and Presence of permanent cardiac pacemaker. and presents today for an acute office visit.  Recently evaluated in the office for a follow-up from the emergency department with right lower extremity cellulitis being treated with Keflex and Bactrim. Seen in the office approximately one week ago and noted to be slightly improving. Notes that his right leg cellulitis has progressively worsened and is now having discharge as well as increased foot swelling. He has completed the course of bactrim and keflex without problems. Notes spreading of the infection and increased amounts of discharge and edema.   Allergies  Allergen Reactions  . Crestor [Rosuvastatin Calcium] Other (See Comments)    Urination of blood     Current Outpatient Prescriptions on File Prior  to Visit  Medication Sig Dispense Refill  . allopurinol (ZYLOPRIM) 300 MG tablet take 1 tablet by mouth once daily (Patient taking differently: Take 300 mg by mouth daily. ) 90 tablet 3  . atorvastatin (LIPITOR) 80 MG tablet Take 1 tablet by mouth  daily 90 tablet 1  . carvedilol (COREG) 25 MG tablet Take 1 tablet by mouth two  times daily 180 tablet 1  . Cholecalciferol (VITAMIN D) 1000 UNITS capsule Take 1,000 Units by mouth daily. Reported on 02/15/2015    . fluticasone (FLONASE) 50 MCG/ACT nasal spray Use 2 sprays in each  nostril every day 48 g 1  . furosemide (LASIX) 40 MG tablet Take 1 tablet by mouth  daily 90 tablet 1  . furosemide (LASIX) 80 MG tablet Take 1 tablet (80 mg total) by mouth 2 (two) times daily. 60 tablet 5  . gabapentin (NEURONTIN) 300 MG capsule Take 2 capsules by mouth 3  times daily 540 capsule 1  . glipiZIDE (GLUCOTROL XL) 10 MG 24 hr tablet Take 1 tablet by mouth  daily with breakfast 90 tablet 1  . HYDROcodone-acetaminophen (NORCO) 10-325 MG tablet Take 1-2 tablets by mouth every 4 (four) hours as needed. 80 tablet 0  . ibuprofen (ADVIL,MOTRIN) 200 MG tablet Take 800 mg by mouth daily as needed for mild pain or moderate pain.     Marland Kitchen lisinopril (PRINIVIL,ZESTRIL) 2.5 MG tablet Take 1 tablet (2.5 mg total) by mouth daily. 90 tablet 3  . metFORMIN (GLUCOPHAGE) 500 MG tablet Take 2 tablets by mouth two times daily 360 tablet 1  . Multiple Vitamin (MULTIVITAMIN WITH MINERALS) TABS tablet Take 1 tablet by mouth every morning.    . ONE TOUCH ULTRA TEST test strip 1 each by Does not apply route 3 (three) times daily.    Glory Rosebush DELICA LANCETS  33G MISC Use as directed three times daily to check blood sugar.  Diagnosis code E11.40 300 each 3  . potassium chloride SA (K-DUR,KLOR-CON) 20 MEQ tablet Take 1 tablet by mouth  every evening 90 tablet 1  . sitaGLIPtin (JANUVIA) 100 MG tablet Take 1 tablet (100 mg total) by mouth daily. 90 tablet 3  . tamsulosin (FLOMAX) 0.4 MG CAPS  capsule Take 1 capsule by mouth  every 12 hours 180 capsule 1  . tiZANidine (ZANAFLEX) 4 MG tablet TAKE 1 TABLET BY MOUTH  EVERY 6 HOURS AS NEEDED FOR MUSCLE SPASMS. 180 tablet 1  . warfarin (COUMADIN) 5 MG tablet Take 1 tablet by mouth daily or as directed by coumadin clinic (Patient taking differently: Take 5 mg by mouth. Take 1 tablet by mouth daily on Monday ,Wednesday, Friday. 2.5 mg Tuesday, Thursday, Saturday, Sunday.) 90 tablet 1   No current facility-administered medications on file prior to visit.     Past Surgical History  Procedure Laterality Date  . S/p left arm surgury after work accident  1991    "2000# steel fell on it"  . S/p right hand surgury for foreign object  1970's    "piece of wood went in my hand; had to get that out"  . Insert / replace / remove pacemaker  ~ 2004    initial pacemaker placement  . Insert / replace / remove pacemaker  10/2009    generator change  . Colonoscopy    . Esophagogastroduodenoscopy N/A 12/01/2013    Procedure: ESOPHAGOGASTRODUODENOSCOPY (EGD);  Surgeon: Lafayette Dragon, MD;  Location: [facility];  Service: Endoscopy;  Laterality: N/A;  . Coronary artery bypass graft  1992    CABG X 2  . Coronary angioplasty with stent placement  12/27/10    "I've had a total of 9 cardiac stents put in"  . Lumbar laminectomy/decompression microdiscectomy Right 03/23/2014    Procedure: LAMINECTOMY AND FORAMINOTOMY RIGHT LUMBAR THREE-FOUR,LUMBAR FOUR-FIVE, LUMBAR FIVE-SACRAL ONE;  Surgeon: Charlie Pitter, MD;  Location: [place] [facility];  Service: Neurosurgery;  Laterality: Right;  right  . Lumbar laminectomy/decompression microdiscectomy Left 12/01/2014    Procedure: Left Lumbar Three-Four, Lumbar Four-Five Laminectomy and Foraminotomy;  Surgeon: Earnie Larsson, MD;  Location: [place] [facility];  Service: Neurosurgery;  Laterality: Left;    Review of Systems  Constitutional: Negative for fever and chills.  Respiratory: Negative for chest tightness and shortness of  breath.   Cardiovascular: Positive for leg swelling. Negative for chest pain and palpitations.  Skin: Positive for color change and rash.      Objective:    BP 120/70 mmHg  Pulse 71  Temp(Src) 97.8 F (36.6 C) (Oral)  Resp 16  Ht 5\' 11"  (1.803 m)  Wt 213 lb (96.616 kg)  BMI 29.72 kg/m2  SpO2 94% Nursing note and vital signs reviewed.  Physical Exam  Constitutional: He is oriented to person, place, and time. He appears well-developed and well-nourished. No distress.  Cardiovascular: Normal rate, regular rhythm, normal heart sounds and intact distal pulses.   Pulmonary/Chest: Effort normal and breath sounds normal.  Neurological: He is alert and oriented to person, place, and time.  Skin: Skin is warm and dry.  Left lower extremity with obvious discoloration and edema. There is clear serous drainage present. There is mild increased temperature compared to the opposite side.  Psychiatric: He has a normal mood and affect. His behavior is normal. Judgment and thought content normal.   Previously taken on 4/17  Today        Assessment & Plan:   Problem List Items Addressed This Visit      Other   Cellulitis of leg, right - Primary    Worsening cellulitis in the right lower extremity that has been resistant to oral antibiotics with increased concern given his diabetic status. Recommend further evaluation in the ED for the potential use of IV antibiotics given his increased vascular risk associated with diabetes. Patient is in agreement with the plan and will go to the ED.          I have discontinued Mr. Stengel cephALEXin. I am also having him maintain his ibuprofen, multivitamin with minerals, Vitamin D, allopurinol, furosemide, HYDROcodone-acetaminophen, gabapentin, tiZANidine, tamsulosin, furosemide, potassium chloride SA, glipiZIDE, carvedilol, metFORMIN, fluticasone, atorvastatin, lisinopril, warfarin, ONE TOUCH ULTRA TEST, sitaGLIPtin, and ONETOUCH DELICA  LANCETS [id number].   Follow-up: Refer to ED for further assessment.    Mauricio Po, FNP

## 2015-05-21 NOTE — Telephone Encounter (Signed)
Forms dropped of at office and given to Howard for completion

## 2015-05-21 NOTE — Assessment & Plan Note (Signed)
Worsening cellulitis in the right lower extremity that has been resistant to oral antibiotics with increased concern given his diabetic status. Recommend further evaluation in the ED for the potential use of IV antibiotics given his increased vascular risk associated with diabetes. Patient is in agreement with the plan and will go to the ED.

## 2015-05-24 ENCOUNTER — Inpatient Hospital Stay (HOSPITAL_COMMUNITY)
Admission: EM | Admit: 2015-05-24 | Discharge: 2015-05-27 | DRG: 603 | Disposition: A | Discharge status: Home Health | Payer: Medicare Other | Attending: Internal Medicine | Admitting: Internal Medicine

## 2015-05-24 ENCOUNTER — Observation Stay (HOSPITAL_COMMUNITY): Payer: Medicare Other

## 2015-05-24 ENCOUNTER — Emergency Department (HOSPITAL_BASED_OUTPATIENT_CLINIC_OR_DEPARTMENT_OTHER)
Admit: 2015-05-24 | Discharge: 2015-05-24 | Disposition: A | Discharge status: Home / Self Care | Payer: Medicare Other | Attending: Emergency Medicine | Admitting: Emergency Medicine

## 2015-05-24 ENCOUNTER — Encounter (HOSPITAL_COMMUNITY): Payer: Self-pay

## 2015-05-24 DIAGNOSIS — E1165 Type 2 diabetes mellitus with hyperglycemia: Secondary | ICD-10-CM | POA: Diagnosis present

## 2015-05-24 DIAGNOSIS — M549 Dorsalgia, unspecified: Secondary | ICD-10-CM | POA: Diagnosis not present

## 2015-05-24 DIAGNOSIS — I5032 Chronic diastolic (congestive) heart failure: Secondary | ICD-10-CM | POA: Diagnosis not present

## 2015-05-24 DIAGNOSIS — N400 Enlarged prostate without lower urinary tract symptoms: Secondary | ICD-10-CM | POA: Diagnosis present

## 2015-05-24 DIAGNOSIS — Z8249 Family history of ischemic heart disease and other diseases of the circulatory system: Secondary | ICD-10-CM

## 2015-05-24 DIAGNOSIS — I252 Old myocardial infarction: Secondary | ICD-10-CM | POA: Diagnosis not present

## 2015-05-24 DIAGNOSIS — Z888 Allergy status to other drugs, medicaments and biological substances status: Secondary | ICD-10-CM | POA: Diagnosis not present

## 2015-05-24 DIAGNOSIS — Z7901 Long term (current) use of anticoagulants: Secondary | ICD-10-CM | POA: Diagnosis not present

## 2015-05-24 DIAGNOSIS — I878 Other specified disorders of veins: Secondary | ICD-10-CM | POA: Diagnosis not present

## 2015-05-24 DIAGNOSIS — I10 Essential (primary) hypertension: Secondary | ICD-10-CM | POA: Diagnosis not present

## 2015-05-24 DIAGNOSIS — E785 Hyperlipidemia, unspecified: Secondary | ICD-10-CM | POA: Diagnosis not present

## 2015-05-24 DIAGNOSIS — M62838 Other muscle spasm: Secondary | ICD-10-CM | POA: Diagnosis present

## 2015-05-24 DIAGNOSIS — I4891 Unspecified atrial fibrillation: Secondary | ICD-10-CM | POA: Diagnosis present

## 2015-05-24 DIAGNOSIS — M79609 Pain in unspecified limb: Secondary | ICD-10-CM

## 2015-05-24 DIAGNOSIS — Z833 Family history of diabetes mellitus: Secondary | ICD-10-CM

## 2015-05-24 DIAGNOSIS — M069 Rheumatoid arthritis, unspecified: Secondary | ICD-10-CM | POA: Diagnosis not present

## 2015-05-24 DIAGNOSIS — Z951 Presence of aortocoronary bypass graft: Secondary | ICD-10-CM | POA: Diagnosis not present

## 2015-05-24 DIAGNOSIS — Z79899 Other long term (current) drug therapy: Secondary | ICD-10-CM

## 2015-05-24 DIAGNOSIS — Z87891 Personal history of nicotine dependence: Secondary | ICD-10-CM

## 2015-05-24 DIAGNOSIS — M109 Gout, unspecified: Secondary | ICD-10-CM | POA: Diagnosis not present

## 2015-05-24 DIAGNOSIS — E1142 Type 2 diabetes mellitus with diabetic polyneuropathy: Secondary | ICD-10-CM | POA: Diagnosis present

## 2015-05-24 DIAGNOSIS — G8929 Other chronic pain: Secondary | ICD-10-CM | POA: Diagnosis present

## 2015-05-24 DIAGNOSIS — Z8546 Personal history of malignant neoplasm of prostate: Secondary | ICD-10-CM

## 2015-05-24 DIAGNOSIS — L03115 Cellulitis of right lower limb: Secondary | ICD-10-CM

## 2015-05-24 DIAGNOSIS — I110 Hypertensive heart disease with heart failure: Secondary | ICD-10-CM | POA: Diagnosis present

## 2015-05-24 DIAGNOSIS — Z7984 Long term (current) use of oral hypoglycemic drugs: Secondary | ICD-10-CM | POA: Diagnosis not present

## 2015-05-24 DIAGNOSIS — M7989 Other specified soft tissue disorders: Secondary | ICD-10-CM | POA: Diagnosis not present

## 2015-05-24 DIAGNOSIS — Z950 Presence of cardiac pacemaker: Secondary | ICD-10-CM | POA: Diagnosis not present

## 2015-05-24 DIAGNOSIS — Z7951 Long term (current) use of inhaled steroids: Secondary | ICD-10-CM

## 2015-05-24 DIAGNOSIS — M79661 Pain in right lower leg: Secondary | ICD-10-CM | POA: Diagnosis not present

## 2015-05-24 DIAGNOSIS — L0390 Cellulitis, unspecified: Secondary | ICD-10-CM

## 2015-05-24 DIAGNOSIS — D649 Anemia, unspecified: Secondary | ICD-10-CM | POA: Diagnosis not present

## 2015-05-24 DIAGNOSIS — D509 Iron deficiency anemia, unspecified: Secondary | ICD-10-CM

## 2015-05-24 DIAGNOSIS — E1140 Type 2 diabetes mellitus with diabetic neuropathy, unspecified: Secondary | ICD-10-CM

## 2015-05-24 DIAGNOSIS — I2510 Atherosclerotic heart disease of native coronary artery without angina pectoris: Secondary | ICD-10-CM | POA: Diagnosis present

## 2015-05-24 DIAGNOSIS — IMO0002 Reserved for concepts with insufficient information to code with codable children: Secondary | ICD-10-CM

## 2015-05-24 DIAGNOSIS — Z955 Presence of coronary angioplasty implant and graft: Secondary | ICD-10-CM

## 2015-05-24 LAB — CBC WITH DIFFERENTIAL/PLATELET
BASOS ABS: 0 10*3/uL (ref 0.0–0.1)
Basophils Relative: 0 %
EOS PCT: 3 %
Eosinophils Absolute: 0.2 10*3/uL (ref 0.0–0.7)
HEMATOCRIT: 31.1 % — AB (ref 39.0–52.0)
Hemoglobin: 9.6 g/dL — ABNORMAL LOW (ref 13.0–17.0)
LYMPHS PCT: 21 %
Lymphs Abs: 1.2 10*3/uL (ref 0.7–4.0)
MCH: 27.5 pg (ref 26.0–34.0)
MCHC: 30.9 g/dL (ref 30.0–36.0)
MCV: 89.1 fL (ref 78.0–100.0)
MONO ABS: 0.4 10*3/uL (ref 0.1–1.0)
MONOS PCT: 6 %
Neutro Abs: 4 10*3/uL (ref 1.7–7.7)
Neutrophils Relative %: 70 %
PLATELETS: 154 10*3/uL (ref 150–400)
RBC: 3.49 MIL/uL — ABNORMAL LOW (ref 4.22–5.81)
RDW: 15.6 % — AB (ref 11.5–15.5)
WBC: 5.8 10*3/uL (ref 4.0–10.5)

## 2015-05-24 LAB — BASIC METABOLIC PANEL
Anion gap: 8 (ref 5–15)
BUN: 27 mg/dL — AB (ref 6–20)
CO2: 23 mmol/L (ref 22–32)
Calcium: 8.8 mg/dL — ABNORMAL LOW (ref 8.9–10.3)
Chloride: 109 mmol/L (ref 101–111)
Creatinine, Ser: 0.95 mg/dL (ref 0.61–1.24)
GFR calc Af Amer: >60 mL/min (ref >60)
GLUCOSE: 173 mg/dL — AB (ref 65–99)
POTASSIUM: 4.8 mmol/L (ref 3.5–5.1)
Sodium: 140 mmol/L (ref 135–145)

## 2015-05-24 LAB — GLUCOSE, CAPILLARY
Glucose-Capillary: 151 mg/dL — ABNORMAL HIGH (ref 65–99)
Glucose-Capillary: 106 mg/dL — ABNORMAL HIGH (ref 65–99)

## 2015-05-24 LAB — PROTIME-INR
INR: 1.8 — ABNORMAL HIGH (ref 0.00–1.49)
Prothrombin Time: 20.8 seconds — ABNORMAL HIGH (ref 11.6–15.2)

## 2015-05-24 MED ORDER — ATORVASTATIN CALCIUM 80 MG PO TABS
80.0000 mg | ORAL_TABLET | Freq: Every day | ORAL | Status: DC
  Administered 2015-05-24 – 2015-05-26 (×3): 80 mg via ORAL
  Filled 2015-05-24 (×3): qty 1

## 2015-05-24 MED ORDER — DIPHENHYDRAMINE HCL 50 MG/ML IJ SOLN
25.0000 mg | Freq: Once | INTRAMUSCULAR | Status: AC
  Administered 2015-05-24: 25 mg via INTRAVENOUS
  Filled 2015-05-24: qty 1

## 2015-05-24 MED ORDER — PIPERACILLIN-TAZOBACTAM 3.375 G IVPB 30 MIN
3.3750 g | Freq: Once | INTRAVENOUS | Status: AC
  Administered 2015-05-24: 3.375 g via INTRAVENOUS
  Filled 2015-05-24: qty 50

## 2015-05-24 MED ORDER — INSULIN ASPART 100 UNIT/ML ~~LOC~~ SOLN
0.0000 [IU] | Freq: Three times a day (TID) | SUBCUTANEOUS | Status: DC
  Administered 2015-05-24: 2 [IU] via SUBCUTANEOUS
  Administered 2015-05-25: 3 [IU] via SUBCUTANEOUS
  Administered 2015-05-25: 2 [IU] via SUBCUTANEOUS
  Administered 2015-05-26: 3 [IU] via SUBCUTANEOUS
  Administered 2015-05-26: 1 [IU] via SUBCUTANEOUS
  Administered 2015-05-26 – 2015-05-27 (×2): 2 [IU] via SUBCUTANEOUS

## 2015-05-24 MED ORDER — TAMSULOSIN HCL 0.4 MG PO CAPS
0.4000 mg | ORAL_CAPSULE | Freq: Every day | ORAL | Status: DC
  Administered 2015-05-24 – 2015-05-26 (×3): 0.4 mg via ORAL
  Filled 2015-05-24 (×3): qty 1

## 2015-05-24 MED ORDER — HYDROCODONE-ACETAMINOPHEN 5-325 MG PO TABS
1.0000 | ORAL_TABLET | ORAL | Status: DC | PRN
  Filled 2015-05-24: qty 1

## 2015-05-24 MED ORDER — PIPERACILLIN-TAZOBACTAM 3.375 G IVPB 30 MIN
3.3750 g | Freq: Once | INTRAVENOUS | Status: AC
  Administered 2015-05-25: 3.375 g via INTRAVENOUS

## 2015-05-24 MED ORDER — MORPHINE SULFATE (PF) 2 MG/ML IV SOLN: 1.0000 mg | INTRAVENOUS | Status: DC | PRN

## 2015-05-24 MED ORDER — CARVEDILOL 25 MG PO TABS
25.0000 mg | ORAL_TABLET | Freq: Two times a day (BID) | ORAL | Status: DC
  Administered 2015-05-25 – 2015-05-27 (×5): 25 mg via ORAL
  Filled 2015-05-24 (×5): qty 1

## 2015-05-24 MED ORDER — VANCOMYCIN HCL 10 G IV SOLR
2000.0000 mg | Freq: Once | INTRAVENOUS | Status: AC
  Administered 2015-05-24: 2000 mg via INTRAVENOUS
  Filled 2015-05-24: qty 2000

## 2015-05-24 MED ORDER — ACETAMINOPHEN 325 MG PO TABS: 650.0000 mg | ORAL_TABLET | Freq: Four times a day (QID) | ORAL | Status: DC | PRN

## 2015-05-24 MED ORDER — POTASSIUM CHLORIDE CRYS ER 20 MEQ PO TBCR
20.0000 meq | EXTENDED_RELEASE_TABLET | Freq: Every day | ORAL | Status: DC
  Administered 2015-05-24 – 2015-05-27 (×4): 20 meq via ORAL
  Filled 2015-05-24 (×4): qty 1

## 2015-05-24 MED ORDER — VANCOMYCIN HCL 10 G IV SOLR
1250.0000 mg | Freq: Two times a day (BID) | INTRAVENOUS | Status: DC
  Administered 2015-05-25 – 2015-05-27 (×5): 1250 mg via INTRAVENOUS
  Filled 2015-05-24 (×6): qty 1250

## 2015-05-24 MED ORDER — SODIUM CHLORIDE 0.9 % IV SOLN
INTRAVENOUS | Status: DC
  Administered 2015-05-24 – 2015-05-25 (×2): via INTRAVENOUS

## 2015-05-24 MED ORDER — FUROSEMIDE 40 MG PO TABS
40.0000 mg | ORAL_TABLET | Freq: Every day | ORAL | Status: DC
  Administered 2015-05-25 – 2015-05-27 (×3): 40 mg via ORAL
  Filled 2015-05-24 (×3): qty 1

## 2015-05-24 MED ORDER — GABAPENTIN 300 MG PO CAPS
300.0000 mg | ORAL_CAPSULE | Freq: Three times a day (TID) | ORAL | Status: DC
  Administered 2015-05-24 – 2015-05-27 (×9): 300 mg via ORAL
  Filled 2015-05-24 (×9): qty 1

## 2015-05-24 MED ORDER — SENNOSIDES-DOCUSATE SODIUM 8.6-50 MG PO TABS: 1.0000 | ORAL_TABLET | Freq: Every evening | ORAL | Status: DC | PRN

## 2015-05-24 MED ORDER — FUROSEMIDE 20 MG PO TABS: 80.0000 mg | ORAL_TABLET | Freq: Two times a day (BID) | ORAL | Status: DC

## 2015-05-24 MED ORDER — WARFARIN - PHARMACIST DOSING INPATIENT
Freq: Every day | Status: DC
  Administered 2015-05-24 – 2015-05-25 (×2)

## 2015-05-24 MED ORDER — LISINOPRIL 5 MG PO TABS
2.5000 mg | ORAL_TABLET | Freq: Every day | ORAL | Status: DC
  Administered 2015-05-25 – 2015-05-27 (×3): 2.5 mg via ORAL
  Filled 2015-05-24 (×3): qty 1

## 2015-05-24 MED ORDER — ONDANSETRON HCL 4 MG PO TABS: 4.0000 mg | ORAL_TABLET | Freq: Four times a day (QID) | ORAL | Status: DC | PRN

## 2015-05-24 MED ORDER — ONDANSETRON HCL 4 MG/2ML IJ SOLN: 4.0000 mg | Freq: Four times a day (QID) | INTRAMUSCULAR | Status: DC | PRN

## 2015-05-24 MED ORDER — PIPERACILLIN-TAZOBACTAM 3.375 G IVPB
3.3750 g | Freq: Three times a day (TID) | INTRAVENOUS | Status: DC
  Administered 2015-05-24 – 2015-05-26 (×4): 3.375 g via INTRAVENOUS
  Filled 2015-05-24 (×6): qty 50

## 2015-05-24 MED ORDER — ALLOPURINOL 300 MG PO TABS
300.0000 mg | ORAL_TABLET | Freq: Every day | ORAL | Status: DC
  Administered 2015-05-24 – 2015-05-27 (×4): 300 mg via ORAL
  Filled 2015-05-24: qty 1
  Filled 2015-05-24: qty 3
  Filled 2015-05-24 (×2): qty 1

## 2015-05-24 MED ORDER — WARFARIN SODIUM 7.5 MG PO TABS
7.5000 mg | ORAL_TABLET | Freq: Once | ORAL | Status: AC
  Administered 2015-05-24: 7.5 mg via ORAL
  Filled 2015-05-24: qty 1
  Filled 2015-05-24: qty 1.5

## 2015-05-24 MED ORDER — ACETAMINOPHEN 650 MG RE SUPP: 650.0000 mg | Freq: Four times a day (QID) | RECTAL | Status: DC | PRN

## 2015-05-24 MED ORDER — FUROSEMIDE 20 MG PO TABS: 40.0000 mg | ORAL_TABLET | Freq: Every day | ORAL | Status: DC

## 2015-05-24 NOTE — Progress Notes (Signed)
VASCULAR LAB PRELIMINARY  PRELIMINARY  PRELIMINARY  PRELIMINARY  Right lower extremity venous duplex completed.    Preliminary report:  There is no DVT or SVT noted in the right lower extremity.   Phebe Dettmer, RVT 05/24/2015, 10:40 AM

## 2015-05-24 NOTE — Progress Notes (Signed)
ANTICOAGULATION CONSULT NOTE - Initial Consult  Pharmacy Consult for warfarin Indication: atrial fibrillation  Allergies  Allergen Reactions  . Crestor [Rosuvastatin Calcium] Other (See Comments)    Urination of blood    Patient Measurements: Height: 5\' 11"  (180.3 cm) Weight: 210 lb (95.255 kg) IBW/kg (Calculated) : 75.3  Vital Signs: Temp: 97.9 F (36.6 C) (04/24 0857) Temp Source: Oral (04/24 0857) BP: 118/95 mmHg (04/24 1345) Pulse Rate: 70 (04/24 1345)  Labs:  Recent Labs  05/24/15 0944  HGB 9.6*  HCT 31.1*  PLT 154  LABPROT 20.8*  INR 1.80*  CREATININE 0.95    Estimated Creatinine Clearance: 86.5 mL/min (by C-G formula based on Cr of 0.95).   Medical History: Past Medical History  Diagnosis Date  . 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 HYPERLIPIDEMIA 08/29/2006    takes Atorvastatin daily  . GOUT 04/22/2007    takes Allopurinol daily  . DEPRESSION 08/29/2006  . Atrioventricular block, complete ([place]) 09/04/2008  . DIASTOLIC HEART FAILURE, CHRONIC 06/09/2008  . BENIGN PROSTATIC HYPERTROPHY 08/29/2006    takes Flomax daily  . LUMBAR RADICULOPATHY, RIGHT 06/10/2007  . INSOMNIA-SLEEP DISORDER-UNSPEC 10/23/2007  . PACEMAKER, PERMANENT 03/04/2008    pt denies this date  . Left lumbar radiculopathy 05/30/2010  . Sleep apnea     "if I lay flat I quit breathing; HOB up I'm fine"  . Arthritis     "lower back; going back down both my sciatic nerves"  . Aortic root dilatation ([place]) 02/02/2011  . HYPERTENSION 08/29/2006    takes Lisinopril daily  . Myocardial infarction ([place]) 12/27/10    "I've had several MIs"  . Muscle spasm     takes Zanaflex daily  . CHF (congestive heart failure) (HCC)     takes Lasix daily  . Shortness of breath dyspnea     "all my life" with exertion  . History of migraine     9yrs ago  . DIABETES MELLITUS, TYPE II 08/29/2006    takes Metformin,Januvia,and Glipizide  daily  . CORONARY ARTERY DISEASE 08/29/2006    takes Coumadin daily   . 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 Peripheral neuropathy (HCC)     takes Gabapentin daily  . Chronic back pain     HNP   . Cataracts, bilateral     immature  . Presence of permanent cardiac pacemaker     Assessment: 74 yom with hx of afib. Pharmacy consulted to continue warfarin inpatient. INR 1.8 on admit, Hg 9.6, plt wnl, no bleed documented.  PTA warfarin dose: 5mg  MWF, 2.5mg  TTSS (last dose 4/23 pta)  Goal of Therapy:  INR 2-3 Monitor platelets by anticoagulation protocol: Yes   Plan:  Warfarin 7.5mg  x 1 dose tonight Daily INR Monitor s/sx bleeding  Elicia Lamp, PharmD, BCPS Clinical Pharmacist Pager [phone] 05/24/2015 2:30 PM

## 2015-05-24 NOTE — ED Provider Notes (Signed)
CSN: [id number]     Arrival date & time 05/24/15  [id number] History   First MD Initiated Contact with Patient 05/24/15 0915     Chief Complaint  Patient presents with  . right lower leg swelling      The history is provided by the patient. No language interpreter was used.    [name] is a 70 y.o. male who presents to the Emergency Department complaining of leg swelling.  He reports chronic right lower extremity swelling that has been worsening over the last 2 weeks. He has been on antibiotics with transient improvement in his symptoms. He denies any fevers but he does have occasional heat to the area. He denies any chest pain, cough. He has chronic shortness of breath, unchanged from baseline. he states there is a lot of weeping from the right leg. The weeping is better when he wraps the area. He saw his PCP on Friday who recommended him go to the ER for further evaluation. He did not come to the ER at that time as he was concerned for transportation. He presents today. He states that the redness is slightly improved but the swelling persists and now has moved to his foot.   Past Medical History  Diagnosis Date  . 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 HYPERLIPIDEMIA 08/29/2006    takes Atorvastatin daily  . GOUT 04/22/2007    takes Allopurinol daily  . DEPRESSION 08/29/2006  . Atrioventricular block, complete ([place]) 09/04/2008  . DIASTOLIC HEART FAILURE, CHRONIC 06/09/2008  . BENIGN PROSTATIC HYPERTROPHY 08/29/2006    takes Flomax daily  . LUMBAR RADICULOPATHY, RIGHT 06/10/2007  . INSOMNIA-SLEEP DISORDER-UNSPEC 10/23/2007  . PACEMAKER, PERMANENT 03/04/2008    pt denies this date  . Left lumbar radiculopathy 05/30/2010  . Sleep apnea     "if I lay flat I quit breathing; HOB up I'm fine"  . Arthritis     "lower back; going back down both my sciatic nerves"  . Aortic root dilatation ([place]) 02/02/2011  . HYPERTENSION 08/29/2006    takes Lisinopril daily  . Myocardial infarction ([place])  12/27/10    "I've had several MIs"  . Muscle spasm     takes Zanaflex daily  . CHF (congestive heart failure) (HCC)     takes Lasix daily  . Shortness of breath dyspnea     "all my life" with exertion  . History of migraine     1yrs ago  . DIABETES MELLITUS, TYPE II 08/29/2006    takes Metformin,Januvia,and Glipizide  daily  . CORONARY ARTERY DISEASE 08/29/2006    takes Coumadin daily  . 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 Peripheral neuropathy (HCC)     takes Gabapentin daily  . Chronic back pain     HNP   . Cataracts, bilateral     immature  . Presence of permanent cardiac pacemaker    Past Surgical History  Procedure Laterality Date  . S/p left arm surgury after work accident  1991    "2000# steel fell on it"  . S/p right hand surgury for foreign object  1970's    "piece of wood went in my hand; had to get that out"  . Insert / replace / remove pacemaker  ~ 2004    initial pacemaker placement  . Insert / replace / remove pacemaker  10/2009    generator change  . Colonoscopy    . Esophagogastroduodenoscopy N/A 12/01/2013    Procedure: ESOPHAGOGASTRODUODENOSCOPY (EGD);  Surgeon: Lafayette Dragon,  MD;  Location: [facility];  Service: Endoscopy;  Laterality: N/A;  . Coronary artery bypass graft  1992    CABG X 2  . Coronary angioplasty with stent placement  12/27/10    "I've had a total of 9 cardiac stents put in"  . Lumbar laminectomy/decompression microdiscectomy Right 03/23/2014    Procedure: LAMINECTOMY AND FORAMINOTOMY RIGHT LUMBAR THREE-FOUR,LUMBAR FOUR-FIVE, LUMBAR FIVE-SACRAL ONE;  Surgeon: Charlie Pitter, MD;  Location: [place] [facility];  Service: Neurosurgery;  Laterality: Right;  right  . Lumbar laminectomy/decompression microdiscectomy Left 12/01/2014    Procedure: Left Lumbar Three-Four, Lumbar Four-Five Laminectomy and Foraminotomy;  Surgeon: Earnie Larsson, MD;  Location: [place] [facility];  Service: Neurosurgery;  Laterality: Left;   Family History  Problem Relation Age  of Onset  . Diabetes Mother   . Diabetes Sister   . Heart disease Sister     2 sister died with heart disease  . Coronary artery disease Other 74    male, first degree relative  . Diabetes Other     1st degree relative  . Heart disease Sister   . Lung cancer Sister     deceased   Social History  Substance Use Topics  . Smoking status: Former Smoker -- 3.00 packs/day for 9 years    Types: Cigarettes    Quit date: 02/26/1973  . Smokeless tobacco: Former Systems developer     Comment: quit smoking 63yrs ago  . Alcohol Use: No    Review of Systems  All other systems reviewed and are negative.     Allergies  Crestor  Home Medications   Prior to Admission medications   Medication Sig Start Date End Date Taking? Authorizing Provider  allopurinol (ZYLOPRIM) 300 MG tablet take 1 tablet by mouth once daily Patient taking differently: Take 300 mg by mouth daily.  03/03/14  Yes Biagio Borg, MD  atorvastatin (LIPITOR) 80 MG tablet Take 1 tablet by mouth  daily 03/15/15  Yes Biagio Borg, MD  carvedilol (COREG) 25 MG tablet Take 1 tablet by mouth two  times daily 03/15/15  Yes Biagio Borg, MD  Cholecalciferol (VITAMIN D) 1000 UNITS capsule Take 1,000 Units by mouth daily. Reported on 02/15/2015 07/31/13  Yes Historical Provider, MD  fluticasone Asencion Islam) 50 MCG/ACT nasal spray Use 2 sprays in each  nostril every day 03/15/15  Yes Biagio Borg, MD  furosemide (LASIX) 80 MG tablet Take 1 tablet (80 mg total) by mouth 2 (two) times daily. 07/15/14  Yes Biagio Borg, MD  gabapentin (NEURONTIN) 300 MG capsule Take 2 capsules by mouth 3  times daily 03/15/15  Yes Biagio Borg, MD  glipiZIDE (GLUCOTROL XL) 10 MG 24 hr tablet Take 1 tablet by mouth  daily with breakfast 03/15/15  Yes Biagio Borg, MD  lisinopril (PRINIVIL,ZESTRIL) 2.5 MG tablet Take 1 tablet (2.5 mg total) by mouth daily. 03/17/15  Yes Biagio Borg, MD  metFORMIN (GLUCOPHAGE) 500 MG tablet Take 2 tablets by mouth two times daily 03/15/15  Yes Biagio Borg, MD  Multiple Vitamin (MULTIVITAMIN WITH MINERALS) TABS tablet Take 1 tablet by mouth every morning.   Yes Historical Provider, MD  potassium chloride SA (K-DUR,KLOR-CON) 20 MEQ tablet Take 1 tablet by mouth  every evening 03/15/15  Yes Biagio Borg, MD  sitaGLIPtin (JANUVIA) 100 MG tablet Take 1 tablet (100 mg total) by mouth daily. Patient taking differently: Take 100 mg by mouth every evening.  05/13/15  Yes Biagio Borg,  MD  tamsulosin (FLOMAX) 0.4 MG CAPS capsule Take 1 capsule by mouth  every 12 hours 03/15/15  Yes Biagio Borg, MD  tiZANidine (ZANAFLEX) 4 MG tablet TAKE 1 TABLET BY MOUTH  EVERY 6 HOURS AS NEEDED FOR MUSCLE SPASMS. 03/15/15  Yes Biagio Borg, MD  warfarin (COUMADIN) 5 MG tablet Take 1 tablet by mouth daily or as directed by coumadin clinic Patient taking differently: Take 5 mg by mouth as directed. Take 1 tablet by mouth daily on Monday ,Wednesday, Friday. 2.5 mg Tuesday, Thursday, Saturday, Sunday. 03/30/15  Yes [name], MD  furosemide (LASIX) 40 MG tablet Take 1 tablet by mouth  daily Patient not taking: Reported on 05/24/2015 03/15/15   Biagio Borg, MD  HYDROcodone-acetaminophen [facility]) 10-325 MG tablet Take 1-2 tablets by mouth every 4 (four) hours as needed. Patient not taking: Reported on 05/24/2015 12/02/14   Earnie Larsson, MD  ibuprofen (ADVIL,MOTRIN) 200 MG tablet Take 800 mg by mouth daily as needed for mild pain or moderate pain.     Historical Provider, MD  ONE TOUCH ULTRA TEST test strip 1 each by Does not apply route 3 (three) times daily. 04/22/15   Historical Provider, MD  Jonetta Speak LANCETS [id number] MISC Use as directed three times daily to check blood sugar.  Diagnosis code E11.40 Patient not taking: Reported on 05/24/2015 05/13/15   Biagio Borg, MD   BP 128/73 mmHg  Pulse 63  Temp(Src) 98.4 F (36.9 C) (Oral)  Resp 17  Ht 5\' 11"  (1.803 m)  Wt 202 lb 13.2 oz (92 kg)  BMI 28.30 kg/m2  SpO2 98% Physical Exam  Constitutional: He is oriented to  person, place, and time. He appears well-developed and well-nourished.  HENT:  Head: Normocephalic and atraumatic.  Cardiovascular: Normal rate and regular rhythm.   No murmur heard. Pulmonary/Chest: Effort normal. No respiratory distress.  Bibasilar rales  Abdominal: Soft. There is no tenderness. There is no rebound and no guarding.  Musculoskeletal:  2-3+ pitting edema of the right lower extremity. Trace pitting edema of the left lower extremity. 2+ DP pulses bilaterally. There are chronic venous stasis changes of the right distal lower extremity on the medial aspect. There are slight erythematous changes to this area.  Neurological: He is alert and oriented to person, place, and time.  Skin: Skin is warm and dry.  Psychiatric: He has a normal mood and affect. His behavior is normal.  Nursing note and vitals reviewed.   ED Course  Procedures (including critical care time) Labs Review Labs Reviewed  BASIC METABOLIC PANEL - Abnormal; Notable for the following:    Glucose, Bld 173 (*)    BUN 27 (*)    Calcium 8.8 (*)    All other components within normal limits  CBC WITH DIFFERENTIAL/PLATELET - Abnormal; Notable for the following:    RBC 3.49 (*)    Hemoglobin 9.6 (*)    HCT 31.1 (*)    RDW 15.6 (*)    All other components within normal limits  PROTIME-INR - Abnormal; Notable for the following:    Prothrombin Time 20.8 (*)    INR 1.80 (*)    All other components within normal limits  CULTURE, BLOOD (ROUTINE X 2)  CULTURE, BLOOD (ROUTINE X 2)  COMPREHENSIVE METABOLIC PANEL  CBC  PROTIME-INR    Imaging Review Dg Tibia/fibula Right  05/24/2015  CLINICAL DATA:  Redness And Swelling Mid Lower Leg To Top Of Foot diabetic EXAM: RIGHT TIBIA AND FIBULA -  2 VIEW COMPARISON:  None. FINDINGS: Numerous surgical clips in the medial soft tissues. No fracture or dislocation. No periosteal reaction. Small vessel calcification noted anteriorly. IMPRESSION: No acute findings Electronically  Signed   By: Skipper Cliche M.D.   On: 05/24/2015 14:03   Dg Foot 2 Views Right  05/24/2015  CLINICAL DATA:  Diabetic with redness and swelling in the mid lower leg extending into the foot. EXAM: RIGHT FOOT - 2 VIEW COMPARISON:  Radiographs 06/24/2007. FINDINGS: The mineralization and alignment are normal. There is no evidence of acute fracture or dislocation. There are stable mild degenerative changes within the midfoot and at the first metatarsal phalangeal joint. There is moderate dorsal soft tissue swelling. Diffuse vascular calcifications are noted. No evidence of radiopaque foreign body. IMPRESSION: Dorsal soft tissue swelling. No acute osseous findings or evidence of foreign body. Electronically Signed   By: Richardean Sale M.D.   On: 05/24/2015 14:03   I have personally reviewed and evaluated these images and lab results as part of my medical decision-making.   EKG Interpretation None      MDM   Final diagnoses:  Cellulitis of right lower extremity    Patient here for evaluation of progressive swelling and redness to the right lower extremity. Exam with mild cellulitis and venous stasis changes. Treating with IV antibiotics as patient has been on outpatient treatment without improvement in his symptoms. Hospitalist consulted for admission.  Quintella Reichert, MD 05/24/15 [phone]

## 2015-05-24 NOTE — ED Notes (Signed)
Attempted report to 5W. 

## 2015-05-24 NOTE — Progress Notes (Signed)
MD notified that patient is complaining of itching and redness of bilateral hands.  Small red spots are appearing, but do not have the usual characteristics of hives. IV benadryl ordered, and given.  This nurse will report to oncoming nurse about incident.  Will continue to monitor patient.

## 2015-05-24 NOTE — ED Notes (Signed)
Patient transported to X-ray 

## 2015-05-24 NOTE — ED Notes (Signed)
Patient here with right lower leg swelling and weeping x 1 week. Denies trauma to same. No shortness of breath

## 2015-05-24 NOTE — H&P (Signed)
History and Physical    [name] [id number] DOB: [birth year] DOA: 05/24/2015  Referring MD/NP/PA: EDP PCP: Cathlean Cower, MD  Outpatient Specialists: Patient Care Team: Biagio Borg, MD as PCP - General (Internal Medicine) Patient coming from:  Home Chief Complaint: right lower extremity cellulitis  HPI: [name] is a 70 y.o. male with medical history significant of HTN, HLD, CAD s/p autologous  CABG, NIDDM with neuropathy, DHF, PMP/CAF on chronic Coumadin,GERD, percent into the emergency department with right lower extremity swelling. In review, he had initially been seen for same complaint and 05/10/2015 at the emergency department, at which time, he was discharged on Keflex and Bactrim, after an IM dose of Rocephin. He was compliant with his medications, but his cellulitis became more evident, for which he was recommended to present to the emergency department on 05/21/2015, that he failed to do so due to transportation issues. On today's visit, the patient reports increasing pain with ambulation, worse when applying pressure to the plantar portion of his foot, think he also reports increased erythema and blistering in the lower pretibial region. Denies any trauma to the area. Denies fevers, chills, night sweats, vision changes, or mucositis. Denies any new respiratory complaints, although has chronic dyspnea on exertion. Denies any chest pain or palpitations. Denies lower extremity swelling. Denies nausea, heartburn or change in bowel habits. Denies abdominal pain. Appetite is normal. Denies any dysuria Denies any bleeding issues such as epistaxis, hematemesis, hematuria or hematochezia.   ED Course:  BP 120/75 mmHg  Pulse 61  Temp(Src) 97.9 F (36.6 C) (Oral)  Resp 17  Ht 5\' 11"  (1.803 m)  Wt 95.255 kg (210 lb)  BMI 29.30 kg/m2  SpO2 97% Received  CBC and CMET unchanged from prior, stable. Bilateral LE dopplers negative for DVT. Received Zosyn IV x1 at the ER Will admit to  med surg obs for treatment of RLE cellulitis.   Review of Systems: As per HPI otherwise 10 point review of systems negative.   Past Medical History  Diagnosis Date  . 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 HYPERLIPIDEMIA 08/29/2006    takes Atorvastatin daily  . GOUT 04/22/2007    takes Allopurinol daily  . DEPRESSION 08/29/2006  . 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 DIASTOLIC HEART FAILURE, CHRONIC 06/09/2008  . BENIGN PROSTATIC HYPERTROPHY 08/29/2006    takes Flomax daily  . LUMBAR RADICULOPATHY, RIGHT 06/10/2007  . INSOMNIA-SLEEP DISORDER-UNSPEC 10/23/2007  . PACEMAKER, PERMANENT 03/04/2008    pt denies this date  . Left lumbar radiculopathy 05/30/2010  . Sleep apnea     "if I lay flat I quit breathing; HOB up I'm fine"  . Arthritis     "lower back; going back down both my sciatic nerves"  . Aortic root dilatation ([place]) 02/02/2011  . HYPERTENSION 08/29/2006    takes Lisinopril daily  . Myocardial infarction ([place]) 12/27/10    "I've had several MIs"  . Muscle spasm     takes Zanaflex daily  . CHF (congestive heart failure) (HCC)     takes Lasix daily  . Shortness of breath dyspnea     "all my life" with exertion  . History of migraine     67yrs ago  . DIABETES MELLITUS, TYPE II 08/29/2006    takes Metformin,Januvia,and Glipizide  daily  . CORONARY ARTERY DISEASE 08/29/2006    takes Coumadin daily  . 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 Peripheral neuropathy (HCC)     takes Gabapentin daily  .  Chronic back pain     HNP   . Cataracts, bilateral     immature  . Presence of permanent cardiac pacemaker     Past Surgical History  Procedure Laterality Date  . S/p left arm surgury after work accident  1991    "2000# steel fell on it"  . S/p right hand surgury for foreign object  1970's    "piece of wood went in my hand; had to get that out"  . Insert / replace / remove pacemaker  ~ 2004    initial pacemaker placement  . Insert / replace / remove pacemaker   10/2009    generator change  . Colonoscopy    . Esophagogastroduodenoscopy N/A 12/01/2013    Procedure: ESOPHAGOGASTRODUODENOSCOPY (EGD);  Surgeon: Lafayette Dragon, MD;  Location: [facility];  Service: Endoscopy;  Laterality: N/A;  . Coronary artery bypass graft  1992    CABG X 2  . Coronary angioplasty with stent placement  12/27/10    "I've had a total of 9 cardiac stents put in"  . Lumbar laminectomy/decompression microdiscectomy Right 03/23/2014    Procedure: LAMINECTOMY AND FORAMINOTOMY RIGHT LUMBAR THREE-FOUR,LUMBAR FOUR-FIVE, LUMBAR FIVE-SACRAL ONE;  Surgeon: Charlie Pitter, MD;  Location: [facility];  Service: Neurosurgery;  Laterality: Right;  right  . Lumbar laminectomy/decompression microdiscectomy Left 12/01/2014    Procedure: Left Lumbar Three-Four, Lumbar Four-Five Laminectomy and Foraminotomy;  Surgeon: Earnie Larsson, MD;  Location: [facility];  Service: Neurosurgery;  Laterality: Left;     reports that he quit smoking about 42 years ago. His smoking use included Cigarettes. He has a 27 pack-year smoking history. He has quit using smokeless tobacco. He reports that he does not drink alcohol or use illicit drugs.  Allergies  Allergen Reactions  . Crestor [Rosuvastatin Calcium] Other (See Comments)    Urination of blood    Family History  Problem Relation Age of Onset  . Diabetes Mother   . Diabetes Sister   . Heart disease Sister     2 sister died with heart disease  . Coronary artery disease Other 34    male, first degree relative  . Diabetes Other     1st degree relative  . Heart disease Sister   . Lung cancer Sister     deceased    Family history reviewed and not pertinent (If you reviewed it)  Prior to Admission medications   Medication Sig Start Date End Date Taking? Authorizing Provider  allopurinol (ZYLOPRIM) 300 MG tablet take 1 tablet by mouth once daily Patient taking differently: Take 300 mg by mouth daily.  03/03/14  Yes Biagio Borg, MD  atorvastatin  (LIPITOR) 80 MG tablet Take 1 tablet by mouth  daily 03/15/15  Yes Biagio Borg, MD  carvedilol (COREG) 25 MG tablet Take 1 tablet by mouth two  times daily 03/15/15  Yes Biagio Borg, MD  Cholecalciferol (VITAMIN D) 1000 UNITS capsule Take 1,000 Units by mouth daily. Reported on 02/15/2015 07/31/13  Yes Historical Provider, MD  fluticasone Asencion Islam) 50 MCG/ACT nasal spray Use 2 sprays in each  nostril every day 03/15/15  Yes Biagio Borg, MD  furosemide (LASIX) 80 MG tablet Take 1 tablet (80 mg total) by mouth 2 (two) times daily. 07/15/14  Yes Biagio Borg, MD  gabapentin (NEURONTIN) 300 MG capsule Take 2 capsules by mouth 3  times daily 03/15/15  Yes Biagio Borg, MD  glipiZIDE (GLUCOTROL XL) 10 MG 24 hr tablet Take  1 tablet by mouth  daily with breakfast 03/15/15  Yes Biagio Borg, MD  lisinopril (PRINIVIL,ZESTRIL) 2.5 MG tablet Take 1 tablet (2.5 mg total) by mouth daily. 03/17/15  Yes Biagio Borg, MD  metFORMIN (GLUCOPHAGE) 500 MG tablet Take 2 tablets by mouth two times daily 03/15/15  Yes Biagio Borg, MD  Multiple Vitamin (MULTIVITAMIN WITH MINERALS) TABS tablet Take 1 tablet by mouth every morning.   Yes Historical Provider, MD  potassium chloride SA (K-DUR,KLOR-CON) 20 MEQ tablet Take 1 tablet by mouth  every evening 03/15/15  Yes Biagio Borg, MD  sitaGLIPtin (JANUVIA) 100 MG tablet Take 1 tablet (100 mg total) by mouth daily. Patient taking differently: Take 100 mg by mouth every evening.  05/13/15  Yes Biagio Borg, MD  tamsulosin [facility]) 0.4 MG CAPS capsule Take 1 capsule by mouth  every 12 hours 03/15/15  Yes Biagio Borg, MD  tiZANidine (ZANAFLEX) 4 MG tablet TAKE 1 TABLET BY MOUTH  EVERY 6 HOURS AS NEEDED FOR MUSCLE SPASMS. 03/15/15  Yes Biagio Borg, MD  warfarin (COUMADIN) 5 MG tablet Take 1 tablet by mouth daily or as directed by coumadin clinic Patient taking differently: Take 5 mg by mouth as directed. Take 1 tablet by mouth daily on Monday ,Wednesday, Friday. 2.5 mg Tuesday, Thursday,  Saturday, Sunday. 03/30/15  Yes [name], MD  furosemide (LASIX) 40 MG tablet Take 1 tablet by mouth  daily Patient not taking: Reported on 05/24/2015 03/15/15   Biagio Borg, MD  HYDROcodone-acetaminophen [facility]) 10-325 MG tablet Take 1-2 tablets by mouth every 4 (four) hours as needed. Patient not taking: Reported on 05/24/2015 12/02/14   Earnie Larsson, MD  ibuprofen (ADVIL,MOTRIN) 200 MG tablet Take 800 mg by mouth daily as needed for mild pain or moderate pain.     Historical Provider, MD  ONE TOUCH ULTRA TEST test strip 1 each by Does not apply route 3 (three) times daily. 04/22/15   Historical Provider, MD  Jonetta Speak LANCETS [id number] MISC Use as directed three times daily to check blood sugar.  Diagnosis code E11.40 Patient not taking: Reported on 05/24/2015 05/13/15   Biagio Borg, MD    Physical Exam:    Filed Vitals:   05/24/15 1015 05/24/15 1145 05/24/15 1215 05/24/15 1230  BP: 99/56 122/78 117/73 120/75  Pulse: 60 65 61 61  Temp:      TempSrc:      Resp:      Height:      Weight:      SpO2: 95% 97% 97% 97%      Constitutional: NAD, calm, comfortable Filed Vitals:   05/24/15 1015 05/24/15 1145 05/24/15 1215 05/24/15 1230  BP: 99/56 122/78 117/73 120/75  Pulse: 60 65 61 61  Temp:      TempSrc:      Resp:      Height:      Weight:      SpO2: 95% 97% 97% 97%   Eyes: PERRL, lids and conjunctivae normal ENMT: Mucous membranes are moist. Posterior pharynx clear of any exudate or lesions. Normal dentition.  Neck: normal, supple, no masses, no thyromegaly Respiratory: No wheezing, bibasilar trace crackles.No rhonchi. Normal respiratory effort. No accessory muscle use.  Cardiovascular: Regular rate and rhythm, no murmurs / rubs / gallops.Bilateral lower extremity edema. 2-3+ in the RLE, pitting, with weaping and erythema. Chronic venous stasis seen. LLE with 1+ pitting edema 2+ pedal pulses. No carotid bruits.  Abdomen: no  tenderness, no masses palpated. No  hepatosplenomegaly. Bowel sounds positive.  Musculoskeletal: no clubbing / cyanosis. No joint deformity upper and lower extremities. Good ROM, no contractures. Normal muscle tone.  Skin: RLE erythema, weaping and venous stasis as mentioned above Neurologic: CN 2-12 grossly intact. Sensation intact, DTR normal. Strength 5/5 in all 4.  Psychiatric: Normal judgment and insight. Alert and oriented x 3. Normal mood.     Labs on Admission: I have personally reviewed following labs and imaging studies  CBC:  Recent Labs Lab 05/24/15 0944  WBC 5.8  NEUTROABS 4.0  HGB 9.6*  HCT 31.1*  MCV 89.1  PLT 123456    Basic Metabolic Panel:  Recent Labs Lab 05/24/15 0944  NA 140  K 4.8  CL 109  CO2 23  GLUCOSE 173*  BUN 27*  CREATININE 0.95  CALCIUM 8.8*    GFR: Estimated Creatinine Clearance: 86.5 mL/min (by C-G formula based on Cr of 0.95).  Liver Function Tests: No results for input(s): AST, ALT, ALKPHOS, BILITOT, PROT, ALBUMIN in the last 168 hours. No results for input(s): LIPASE, AMYLASE in the last 168 hours. No results for input(s): AMMONIA in the last 168 hours.  Coagulation Profile:  Recent Labs Lab 05/24/15 0944  INR 1.80*    Urine analysis:    Component Value Date/Time   COLORURINE YELLOW 02/24/2014 [place] 02/24/2014 0858   LABSPEC 1.020 02/24/2014 0858   PHURINE 6.0 02/24/2014 0858   GLUCOSEU 500* 02/24/2014 0858   GLUCOSEU NEGATIVE 12/27/2010 1349   HGBUR NEGATIVE 02/24/2014 0858   BILIRUBINUR NEGATIVE 02/24/2014 0858   KETONESUR NEGATIVE 02/24/2014 0858   PROTEINUR NEGATIVE 12/27/2010 1349   UROBILINOGEN 0.2 02/24/2014 0858   NITRITE NEGATIVE 02/24/2014 0858   LEUKOCYTESUR NEGATIVE 02/24/2014 0858    Sepsis Labs: @LABRCNTIP (procalcitonin:4,lacticidven:4) )No results found for this or any previous visit (from the past 240 hour(s)).   Radiological Exams on Admission: No results found.  EKG: Independently reviewed.    Assessment/Plan Principal Problem:   Cellulitis of leg, right Active Problems:   Type 2 diabetes, uncontrolled, with neuropathy (HCC)   Hyperlipidemia   Essential hypertension   DIASTOLIC HEART FAILURE, CHRONIC   PACEMAKER, PERMANENT   Cellulitis   Cellulitis of right leg   Anemia    Cellulitis of the right leg, non responding to antibiotic regimen as outpatient, admitted with worsening RLE edema with painful erythema. Afebrile, non toxic appearing, normal WBC. Dopplers negative for DVR. S/p IV Zosyn x1.  -Admit to telemetry Broad spectrum antibiotics with vancomycin and Zosyn per Cellulitis order set Blood cultures Right tibial and foot xray to rule out bone involvement IVF  Pain control with oral meds prn   Hypertension/CAD s/p autologous CABG,currently low. BP 99/56 with repeat BP in the 120s/75 mmHg. Revision of his outpatient BPs show that he is at baseline  Pulse 60 P Oxymetry normal Resume home anti-hypertensive medications for now   Hyperlipidemia Continue home statins  Diastolic Heart Failure,  2D Echo EF %55-60% (2015).  No active cardiac complaints. Continue home meds   Atrial Fibrillation Rate controlled  He is on Comumadin.   Coumadin for now as noted above    Type II Diabetes Home regimen includes oral agents Current blood sugar level is 173 Lab Results  Component Value Date   HGBA1C 8.1* 02/24/2014  Hgb A1C Hold home oral diabetic medications.  SSI Continue Neurontin for neuropathy Heart healthy carb modified diet.  Anemia. Acute on chronic, in the setting of infection,  antibiotics   Hemoglobin 9.6 on admission. No bleeding issues Repeat CBC in am No transfusion indicated at this time    DVT prophylaxis: Anticoagulated with Coumadin Code Status:   Full  Family Communication:  Discussed with patient Disposition Plan: Expect patient to be discharged to home Consults called:    None Admission status: Obs Tele   [facility]   If 7PM-7AM, please contact night-coverage [email or web address] Password [facility]  05/24/2015, 12:59 PM

## 2015-05-24 NOTE — Progress Notes (Signed)
Pharmacy Antibiotic Note  [name] is a 70 y.o. male admitted on 05/24/2015 with cellulitis.  Pharmacy has been consulted for vanc/zosyn dosing. Recently d/c'd on Keflex, Bactrim, CTX IM x1. AF, wbc wnl, SCr 0.95 on admit, CrCl~85.  Plan: Vanc 2g IV x1; then 1250mg  IV q12h Zosyn 32min inf x1; then 3.375g IV w8h Monitor clinical progress, c/s, renal function, abx plan/LOT VT@SS  as indicated   Height: 5\' 11"  (180.3 cm) Weight: 210 lb (95.255 kg) IBW/kg (Calculated) : 75.3  Temp (24hrs), Avg:97.9 F (36.6 C), Min:97.9 F (36.6 C), Max:97.9 F (36.6 C)   Recent Labs Lab 05/24/15 0944  WBC 5.8  CREATININE 0.95    Estimated Creatinine Clearance: 86.5 mL/min (by C-G formula based on Cr of 0.95).    Allergies  Allergen Reactions  . Crestor [Rosuvastatin Calcium] Other (See Comments)    Urination of blood    Antimicrobials this admission: 4/24 vanc >>  4/24 zosyn >>   Dose adjustments this admission: n/a  Microbiology results: 4/24 BCx:    Elicia Lamp, PharmD, BCPS Clinical Pharmacist Pager [phone] 05/24/2015 1:15 PM

## 2015-05-25 DIAGNOSIS — Z7951 Long term (current) use of inhaled steroids: Secondary | ICD-10-CM | POA: Diagnosis not present

## 2015-05-25 DIAGNOSIS — I10 Essential (primary) hypertension: Secondary | ICD-10-CM

## 2015-05-25 DIAGNOSIS — M62838 Other muscle spasm: Secondary | ICD-10-CM | POA: Diagnosis present

## 2015-05-25 DIAGNOSIS — Z8546 Personal history of malignant neoplasm of prostate: Secondary | ICD-10-CM | POA: Diagnosis not present

## 2015-05-25 DIAGNOSIS — Z833 Family history of diabetes mellitus: Secondary | ICD-10-CM | POA: Diagnosis not present

## 2015-05-25 DIAGNOSIS — I5032 Chronic diastolic (congestive) heart failure: Secondary | ICD-10-CM

## 2015-05-25 DIAGNOSIS — M549 Dorsalgia, unspecified: Secondary | ICD-10-CM | POA: Diagnosis present

## 2015-05-25 DIAGNOSIS — Z955 Presence of coronary angioplasty implant and graft: Secondary | ICD-10-CM | POA: Diagnosis not present

## 2015-05-25 DIAGNOSIS — Z951 Presence of aortocoronary bypass graft: Secondary | ICD-10-CM | POA: Diagnosis not present

## 2015-05-25 DIAGNOSIS — Z7901 Long term (current) use of anticoagulants: Secondary | ICD-10-CM | POA: Diagnosis not present

## 2015-05-25 DIAGNOSIS — M109 Gout, unspecified: Secondary | ICD-10-CM | POA: Diagnosis present

## 2015-05-25 DIAGNOSIS — Z7984 Long term (current) use of oral hypoglycemic drugs: Secondary | ICD-10-CM | POA: Diagnosis not present

## 2015-05-25 DIAGNOSIS — I2510 Atherosclerotic heart disease of native coronary artery without angina pectoris: Secondary | ICD-10-CM | POA: Diagnosis present

## 2015-05-25 DIAGNOSIS — I4891 Unspecified atrial fibrillation: Secondary | ICD-10-CM | POA: Diagnosis present

## 2015-05-25 DIAGNOSIS — Z8249 Family history of ischemic heart disease and other diseases of the circulatory system: Secondary | ICD-10-CM | POA: Diagnosis not present

## 2015-05-25 DIAGNOSIS — Z79899 Other long term (current) drug therapy: Secondary | ICD-10-CM | POA: Diagnosis not present

## 2015-05-25 DIAGNOSIS — Z950 Presence of cardiac pacemaker: Secondary | ICD-10-CM

## 2015-05-25 DIAGNOSIS — I252 Old myocardial infarction: Secondary | ICD-10-CM | POA: Diagnosis not present

## 2015-05-25 DIAGNOSIS — M79661 Pain in right lower leg: Secondary | ICD-10-CM | POA: Diagnosis present

## 2015-05-25 DIAGNOSIS — I878 Other specified disorders of veins: Secondary | ICD-10-CM | POA: Diagnosis present

## 2015-05-25 DIAGNOSIS — D649 Anemia, unspecified: Secondary | ICD-10-CM | POA: Diagnosis present

## 2015-05-25 DIAGNOSIS — G8929 Other chronic pain: Secondary | ICD-10-CM | POA: Diagnosis present

## 2015-05-25 DIAGNOSIS — Z87891 Personal history of nicotine dependence: Secondary | ICD-10-CM | POA: Diagnosis not present

## 2015-05-25 DIAGNOSIS — L03115 Cellulitis of right lower limb: Principal | ICD-10-CM

## 2015-05-25 DIAGNOSIS — E785 Hyperlipidemia, unspecified: Secondary | ICD-10-CM | POA: Diagnosis present

## 2015-05-25 DIAGNOSIS — M069 Rheumatoid arthritis, unspecified: Secondary | ICD-10-CM | POA: Diagnosis present

## 2015-05-25 DIAGNOSIS — N400 Enlarged prostate without lower urinary tract symptoms: Secondary | ICD-10-CM | POA: Diagnosis present

## 2015-05-25 DIAGNOSIS — E1142 Type 2 diabetes mellitus with diabetic polyneuropathy: Secondary | ICD-10-CM | POA: Diagnosis present

## 2015-05-25 DIAGNOSIS — I110 Hypertensive heart disease with heart failure: Secondary | ICD-10-CM | POA: Diagnosis present

## 2015-05-25 DIAGNOSIS — Z888 Allergy status to other drugs, medicaments and biological substances status: Secondary | ICD-10-CM | POA: Diagnosis not present

## 2015-05-25 DIAGNOSIS — E1165 Type 2 diabetes mellitus with hyperglycemia: Secondary | ICD-10-CM | POA: Diagnosis present

## 2015-05-25 LAB — GLUCOSE, CAPILLARY
GLUCOSE-CAPILLARY: 90 mg/dL (ref 65–99)
GLUCOSE-CAPILLARY: 208 mg/dL — AB (ref 65–99)
GLUCOSE-CAPILLARY: 176 mg/dL — AB (ref 65–99)
Glucose-Capillary: 215 mg/dL — ABNORMAL HIGH (ref 65–99)

## 2015-05-25 LAB — COMPREHENSIVE METABOLIC PANEL
ALK PHOS: 63 U/L (ref 38–126)
ALT: 30 U/L (ref 17–63)
ANION GAP: 11 (ref 5–15)
AST: 30 U/L (ref 15–41)
Albumin: 3.4 g/dL — ABNORMAL LOW (ref 3.5–5.0)
BILIRUBIN TOTAL: 0.7 mg/dL (ref 0.3–1.2)
BUN: 19 mg/dL (ref 6–20)
CALCIUM: 8.8 mg/dL — AB (ref 8.9–10.3)
CO2: 22 mmol/L (ref 22–32)
Chloride: 107 mmol/L (ref 101–111)
Creatinine, Ser: 0.86 mg/dL (ref 0.61–1.24)
GFR calc non Af Amer: >60 mL/min (ref >60)
Glucose, Bld: 94 mg/dL (ref 65–99)
Potassium: 3.9 mmol/L (ref 3.5–5.1)
Sodium: 140 mmol/L (ref 135–145)
TOTAL PROTEIN: 6.1 g/dL — AB (ref 6.5–8.1)

## 2015-05-25 LAB — PROTIME-INR
INR: 2.23 — AB (ref 0.00–1.49)
PROTHROMBIN TIME: 24.5 s — AB (ref 11.6–15.2)

## 2015-05-25 LAB — CBC
HCT: 29.4 % — ABNORMAL LOW (ref 39.0–52.0)
HEMOGLOBIN: 9.3 g/dL — AB (ref 13.0–17.0)
MCH: 28.1 pg (ref 26.0–34.0)
MCHC: 31.6 g/dL (ref 30.0–36.0)
MCV: 88.8 fL (ref 78.0–100.0)
Platelets: 158 10*3/uL (ref 150–400)
RBC: 3.31 MIL/uL — AB (ref 4.22–5.81)
RDW: 15.5 % (ref 11.5–15.5)
WBC: 4.8 10*3/uL (ref 4.0–10.5)

## 2015-05-25 MED ORDER — DIPHENHYDRAMINE HCL 50 MG/ML IJ SOLN
25.0000 mg | Freq: Once | INTRAMUSCULAR | Status: AC
  Administered 2015-05-25: 25 mg via INTRAVENOUS
  Filled 2015-05-25: qty 1

## 2015-05-25 MED ORDER — DIPHENHYDRAMINE HCL 25 MG PO CAPS
25.0000 mg | ORAL_CAPSULE | Freq: Four times a day (QID) | ORAL | Status: DC | PRN
  Administered 2015-05-25 – 2015-05-27 (×4): 25 mg via ORAL
  Filled 2015-05-25 (×4): qty 1

## 2015-05-25 MED ORDER — WARFARIN SODIUM 5 MG PO TABS
5.0000 mg | ORAL_TABLET | Freq: Once | ORAL | Status: AC
  Administered 2015-05-25: 5 mg via ORAL
  Filled 2015-05-25: qty 1

## 2015-05-25 NOTE — Consult Note (Signed)
   [facility] Inpatient Consult   05/25/2015  [name] 1945-07-12 [id number]   Made aware by [place] of patient's admission. [name] has been active on and off with [place] [facility] services. Spoke with him at bedside to discuss him re-enrolling with [place] [facility] program. He pleasantly declines stating he knows how to contact the [place] as she is currently seeing his wife. He states he does not think he needs any additional follow up but will check in with the [place] if he needs to. Left contact information at bedside. Appreciative of visit. Will make [facility] RNCM aware of bedside conversation with [name]. Made inpatient RNCM aware that patient politely declined [place] [facility] follow up at this time.    Marthenia Rolling, MSN-Ed, RN,BSN [facility] Liaison [phone]

## 2015-05-25 NOTE — Progress Notes (Signed)
Occupational Therapy Treatment Patient Details Name: [name] MRN: [id number] DOB: 1945-12-27 Today's Date: 05/25/2015    History of present illness Pt admitted with R LE cellulitis. PMH: HLD, CHF, HTN, DM, CAD, peripheral neuropathy.   OT comments  Pt is performing self care and mobility at an independent to modified independent level.  No OT or DME needs. Signing off.  Follow Up Recommendations  No OT follow up    Equipment Recommendations  None recommended by OT    Recommendations for Other Services      Precautions / Restrictions Precautions Precautions: None       Mobility Bed Mobility Overal bed mobility: Modified Independent             General bed mobility comments: increased time, use of rail  Transfers Overall transfer level: Modified independent Equipment used: Straight cane                  Balance                                   ADL Overall ADL's : Independent                                              Vision                     Perception     Praxis      Cognition   Behavior During Therapy: WFL for tasks assessed/performed Overall Cognitive Status: Within Functional Limits for tasks assessed                       Extremity/Trunk Assessment  Upper Extremity Assessment Upper Extremity Assessment: Overall WFL for tasks assessed   Lower Extremity Assessment Lower Extremity Assessment: Overall WFL for tasks assessed   Cervical / Trunk Assessment Cervical / Trunk Assessment: Normal    Exercises     Shoulder Instructions       General Comments      Pertinent Vitals/ Pain       Pain Assessment: No/denies pain  Home Living Family/patient expects to be discharged to:: Private residence Living Arrangements: Spouse/significant other   Type of Home: House Home Access: Ramped entrance     Home Layout: One level (one step down into a sunroom)     Bathroom  Shower/Tub: Teacher, early years/pre: Standard     Home Equipment: Marine scientist - single point   Additional Comments: pt is the caregiver of his wife, shower seat belongs to wife      Prior Functioning/Environment Level of Independence: Independent with assistive device(s)        Comments: ambulates with cane outside of home, otherwise independent.   Frequency       Progress Toward Goals  OT Goals(current goals can now be found in the care plan section)     Acute Rehab OT Goals Patient Stated Goal: go home tomorrow  Plan      Co-evaluation                 End of Session     Activity Tolerance Patient tolerated treatment well   Patient Left in bed;with call bell/phone within reach   Nurse Communication  Functional Assessment Tool Used: clinical judgement Functional Limitation: Self care Self Care Current Status [id number]): At least 1 percent but less than 20 percent impaired, limited or restricted Self Care Goal Status [id number]): At least 1 percent but less than 20 percent impaired, limited or restricted Self Care Discharge Status [phone]): At least 1 percent but less than 20 percent impaired, limited or restricted   Time: 1115-1131 OT Time Calculation (min): 16 min  Charges: OT G-codes **NOT FOR INPATIENT CLASS** Functional Assessment Tool Used: clinical judgement Functional Limitation: Self care Self Care Current Status [id number]): At least 1 percent but less than 20 percent impaired, limited or restricted Self Care Goal Status [id number]): At least 1 percent but less than 20 percent impaired, limited or restricted Self Care Discharge Status [phone]): At least 1 percent but less than 20 percent impaired, limited or restricted OT General Charges $OT Visit: 1 Procedure OT Evaluation $OT Eval Low Complexity: 1 Procedure  Malka So 05/25/2015, 11:51 AM  [phone]

## 2015-05-25 NOTE — Care Management Note (Signed)
Case Management Note  Patient Details  Name: [name] MRN: [id number] Date of Birth: 1945-11-25  Subjective/Objective:                 Spoke with patient in the room. Patient in obs for cellulitis on R leg, shin area. IV Abx. States he lives at home with his wife. He has a walker and cane at home. He denies any needs for further DME at this time and does not report any difficulties obtaining his meds or getting to MD.   Action/Plan:  Anticipate DC to home when medically stable.  Expected Discharge Date:  05/27/15               Expected Discharge Plan:  Home/Self Care  In-House Referral:     Discharge planning Services  CM Consult  Post Acute Care Choice:    Choice offered to:     DME Arranged:    DME Agency:     HH Arranged:    HH Agency:     Status of Service:  In process, will continue to follow  Medicare Important Message Given:    Date Medicare IM Given:    Medicare IM give by:    Date Additional Medicare IM Given:    Additional Medicare Important Message give by:     If discussed at [place] of Stay Meetings, dates discussed:    Additional Comments:  Carles Collet, RN 05/25/2015, 11:35 AM

## 2015-05-25 NOTE — Care Management Obs Status (Signed)
[facility] NOTIFICATION   Patient Details  Name: [name] MRN: [id number] Date of Birth: 07/12/1945   Medicare Observation Status Notification Given:  Yes    Carles Collet, RN 05/25/2015, 11:34 AM

## 2015-05-25 NOTE — Progress Notes (Signed)
PROGRESS NOTE  [name] [id number] DOB: 03-12-1945 DOA: 05/24/2015 PCP: Cathlean Cower, MD Outpatient Specialists:   Brief Narrative: 70 year old male with a medical history of CAD, DM, gout, heart failure with pacemaker-recently treated for presumed right lower extremity cellulitis with Keflex and Bactrim presented to the ED on 4/24 with worsening right lower extremity cellulitis.   Subjective: Pt is doing well, no complaints other than right leg pain and swelling.   Assessment & Plan: Cellulitis of right leg: Afebrile with normal WBC. Doppler was negative for DVT. Xray of the right foot and right tib/fib with no evidence of osteomyelitis. Suspect that he has chronic venous stasis changes at baseine. Patient thinks erythema in the right leg has somewhat improved, willl continue with  IV Vancomycin and Zosyn. Have asked patient to elevate legs as much as possible. Follow cultures, if improvement continues, suspect we could discontinue Zosyn soon.  Active Problems: Hypertension:Controlled on Coreg and Lisinopril. Continue and monitor.  Hyperlipidemia: Continue statin  Hx of CAD s/p CABG: No chest pain or shortness of breath. Suspect not on aspirin as on Coumadin, continue statin and beta blocker.  Type 2 diabetes: CBG stable with SSI, oral hypoglycemics on hold.  Diastolic Heart Failure: 2D Echo EF 55-60% in 2015: Clinically compensated-continue Lasix  Atrial fibrillation:. Rate controlled and on coumadin. INR therapeutic-pharmacy managing coumadin  History of complete heart block-status post permanent pacemaker placement  [id number] Flomax  DVT prophylaxis: Coumadin Code Status: Full Family Communication: No family at bedside Disposition Plan: Home   Consultants:  None  Procedures:  None   Antimicrobials: IV Vancomycin started on 4/24 IV Zosyn started on 4/24   Objective: Filed Vitals:   05/24/15 2107 05/25/15 0500 05/25/15 0514 05/25/15 0902  BP: 129/73   107/60 132/79  Pulse: 66  61 60  Temp: 97.8 F (36.6 C)  97.4 F (36.3 C)   TempSrc:      Resp: 18  18 20   Height:      Weight:  97.705 kg (215 lb 6.4 oz)    SpO2: 100%  93% 97%    Intake/Output Summary (Last 24 hours) at 05/25/15 1002 Last data filed at 05/24/15 1900  Gross per 24 hour  Intake 665.83 ml  Output      0 ml  Net 665.83 ml   Filed Weights   05/24/15 0857 05/24/15 1503 05/25/15 0500  Weight: 95.255 kg (210 lb) 92 kg (202 lb 13.2 oz) 97.705 kg (215 lb 6.4 oz)    Examination:  Filed Vitals:   05/24/15 2107 05/25/15 0500 05/25/15 0514 05/25/15 0902  BP: 129/73  107/60 132/79  Pulse: 66  61 60  Temp: 97.8 F (36.6 C)  97.4 F (36.3 C)   TempSrc:      Resp: 18  18 20   Height:      Weight:  97.705 kg (215 lb 6.4 oz)    SpO2: 100%  93% 97%   Constitutional: well nourished, well developed, NAD Eyes: PERRL Neck: supple, no JVD Respiratory: clear to auscultation bilaterally, no wheezing, no crackles. Normal respiratory effort. No accessory muscle use.  Cardiovascular: Regular rate and rhythm. Bilateral lower extremity edema. 2-3+ in the RLE with erythema and skin changes. LLE mild skin changes. 2+ pedal pulses. See pictures below-taken 4/25 Abdomen: no tenderness. Bowel sounds positive.  Neurologic: non focal Psychiatric: Normal judgment and insight. Alert and oriented x 3. Normal mood.        Data Reviewed: I have personally reviewed following  labs and imaging studies  CBC:  Recent Labs Lab 05/24/15 0944 05/25/15 0718  WBC 5.8 4.8  NEUTROABS 4.0  --   HGB 9.6* 9.3*  HCT 31.1* 29.4*  MCV 89.1 88.8  PLT 154 0000000   Basic Metabolic Panel:  Recent Labs Lab 05/24/15 0944 05/25/15 0718  NA 140 140  K 4.8 3.9  CL 109 107  CO2 23 22  GLUCOSE 173* 94  BUN 27* 19  CREATININE 0.95 0.86  CALCIUM 8.8* 8.8*   GFR: Estimated Creatinine Clearance: 96.7 mL/min (by C-G formula based on Cr of 0.86). Liver Function Tests:  Recent Labs Lab  05/25/15 0718  AST 30  ALT 30  ALKPHOS 63  BILITOT 0.7  PROT 6.1*  ALBUMIN 3.4*   Coagulation Profile:  Recent Labs Lab 05/24/15 0944 05/25/15 0718  INR 1.80* 2.23*   CBG:  Recent Labs Lab 05/24/15 1717 05/24/15 2147 05/25/15 0756  GLUCAP 151* 106* 90   Urine analysis:    Component Value Date/Time   COLORURINE YELLOW 02/24/2014 0858   APPEARANCEUR CLEAR 02/24/2014 0858   LABSPEC 1.020 02/24/2014 0858   PHURINE 6.0 02/24/2014 0858   GLUCOSEU 500* 02/24/2014 0858   GLUCOSEU NEGATIVE 12/27/2010 1349   HGBUR NEGATIVE 02/24/2014 0858   BILIRUBINUR NEGATIVE 02/24/2014 0858   KETONESUR NEGATIVE 02/24/2014 0858   PROTEINUR NEGATIVE 12/27/2010 1349   UROBILINOGEN 0.2 02/24/2014 0858   NITRITE NEGATIVE 02/24/2014 0858   LEUKOCYTESUR NEGATIVE 02/24/2014 0858   Radiology Studies: Dg Tibia/fibula Right  05/24/2015  CLINICAL DATA:  Redness And Swelling Mid Lower Leg To Top Of Foot diabetic EXAM: RIGHT TIBIA AND FIBULA - 2 VIEW COMPARISON:  None. FINDINGS: Numerous surgical clips in the medial soft tissues. No fracture or dislocation. No periosteal reaction. Small vessel calcification noted anteriorly. IMPRESSION: No acute findings Electronically Signed   By: Skipper Cliche M.D.   On: 05/24/2015 14:03   Dg Foot 2 Views Right  05/24/2015  CLINICAL DATA:  Diabetic with redness and swelling in the mid lower leg extending into the foot. EXAM: RIGHT FOOT - 2 VIEW COMPARISON:  Radiographs 06/24/2007. FINDINGS: The mineralization and alignment are normal. There is no evidence of acute fracture or dislocation. There are stable mild degenerative changes within the midfoot and at the first metatarsal phalangeal joint. There is moderate dorsal soft tissue swelling. Diffuse vascular calcifications are noted. No evidence of radiopaque foreign body. IMPRESSION: Dorsal soft tissue swelling. No acute osseous findings or evidence of foreign body. Electronically Signed   By: Richardean Sale M.D.    On: 05/24/2015 14:03     Scheduled Meds: . allopurinol  300 mg Oral Daily  . atorvastatin  80 mg Oral q1800  . carvedilol  25 mg Oral BID WC  . furosemide  40 mg Oral Daily  . gabapentin  300 mg Oral TID  . insulin aspart  0-9 Units Subcutaneous TID WC  . lisinopril  2.5 mg Oral Daily  . piperacillin-tazobactam  3.375 g Intravenous Once  . piperacillin-tazobactam (ZOSYN)  IV  3.375 g Intravenous Q8H  . potassium chloride SA  20 mEq Oral Daily  . tamsulosin  0.4 mg Oral QPC supper  . vancomycin  1,250 mg Intravenous Q12H  . Warfarin - Pharmacist Dosing Inpatient   Does not apply q1800    Time spent: 25 minutes   Oren Binet, MD [facility] Pager [phone]  If 7PM-7AM, please contact night-coverage [email or web address] Password [email or web address] 05/25/2015, 10:02 AM

## 2015-05-25 NOTE — Progress Notes (Signed)
ANTICOAGULATION CONSULT NOTE - Follow Up Consult  Pharmacy Consult for Coumadin Indication: atrial fibrillation  Allergies  Allergen Reactions  . Crestor [Rosuvastatin Calcium] Other (See Comments)    Urination of blood    Patient Measurements: Height: 5\' 11"  (180.3 cm) Weight: 215 lb 6.4 oz (97.705 kg) IBW/kg (Calculated) : 75.3 Heparin Dosing Weight:   Vital Signs: Temp: 97.4 F (36.3 C) (04/25 0514) BP: 132/79 mmHg (04/25 0902) Pulse Rate: 60 (04/25 0902)  Labs:  Recent Labs  05/24/15 0944 05/25/15 0718  HGB 9.6* 9.3*  HCT 31.1* 29.4*  PLT 154 158  LABPROT 20.8* 24.5*  INR 1.80* 2.23*  CREATININE 0.95 0.86    Estimated Creatinine Clearance: 96.7 mL/min (by C-G formula based on Cr of 0.86).   Medications:  Scheduled:  . allopurinol  300 mg Oral Daily  . atorvastatin  80 mg Oral q1800  . carvedilol  25 mg Oral BID WC  . furosemide  40 mg Oral Daily  . gabapentin  300 mg Oral TID  . insulin aspart  0-9 Units Subcutaneous TID WC  . lisinopril  2.5 mg Oral Daily  . piperacillin-tazobactam  3.375 g Intravenous Once  . piperacillin-tazobactam (ZOSYN)  IV  3.375 g Intravenous Q8H  . potassium chloride SA  20 mEq Oral Daily  . tamsulosin  0.4 mg Oral QPC supper  . vancomycin  1,250 mg Intravenous Q12H  . Warfarin - Pharmacist Dosing Inpatient   Does not apply q1800    Assessment: 70yo male with AFib.  INR therapeutic this AM.  Hg & pltc stable.  No bleeding noted.  Goal of Therapy:  INR 2-3 Monitor platelets by anticoagulation protocol: Yes   Plan:  Coumadin 5mg  today Daily INR Watch for s/s of bleeding  Gracy Bruins, PharmD [facility]

## 2015-05-25 NOTE — Progress Notes (Signed)
05/25/15  1804  Per Dr Sloan Leiter if IV team is not able to get IV access on second attempt, then patient will need a PICC line placed.

## 2015-05-25 NOTE — Progress Notes (Signed)
05/25/15  1745  Paged Dr Sloan Leiter to inform him patient has no IV access. Rt hand IV infiltrated. Warm compress applied to rt hand. IV team as tried but were not successful.

## 2015-05-26 DIAGNOSIS — E1165 Type 2 diabetes mellitus with hyperglycemia: Secondary | ICD-10-CM

## 2015-05-26 DIAGNOSIS — E1140 Type 2 diabetes mellitus with diabetic neuropathy, unspecified: Secondary | ICD-10-CM

## 2015-05-26 LAB — BASIC METABOLIC PANEL
Anion gap: 8 (ref 5–15)
BUN: 16 mg/dL (ref 6–20)
CHLORIDE: 107 mmol/L (ref 101–111)
CO2: 25 mmol/L (ref 22–32)
CREATININE: 0.9 mg/dL (ref 0.61–1.24)
Calcium: 8.9 mg/dL (ref 8.9–10.3)
GFR calc Af Amer: >60 mL/min (ref >60)
GFR calc non Af Amer: >60 mL/min (ref >60)
GLUCOSE: 158 mg/dL — AB (ref 65–99)
POTASSIUM: 4.1 mmol/L (ref 3.5–5.1)
SODIUM: 140 mmol/L (ref 135–145)

## 2015-05-26 LAB — GLUCOSE, CAPILLARY
GLUCOSE-CAPILLARY: 224 mg/dL — AB (ref 65–99)
GLUCOSE-CAPILLARY: 151 mg/dL — AB (ref 65–99)
GLUCOSE-CAPILLARY: 177 mg/dL — AB (ref 65–99)
Glucose-Capillary: 150 mg/dL — ABNORMAL HIGH (ref 65–99)

## 2015-05-26 LAB — PROTIME-INR
INR: 2.46 — ABNORMAL HIGH (ref 0.00–1.49)
Prothrombin Time: 26.4 seconds — ABNORMAL HIGH (ref 11.6–15.2)

## 2015-05-26 MED ORDER — WARFARIN SODIUM 2.5 MG PO TABS
2.5000 mg | ORAL_TABLET | Freq: Once | ORAL | Status: AC
  Administered 2015-05-26: 2.5 mg via ORAL
  Filled 2015-05-26: qty 1

## 2015-05-26 NOTE — Progress Notes (Signed)
PROGRESS NOTE  [name] [id number] DOB: 11-22-1945 DOA: 05/24/2015 PCP: Cathlean Cower, MD Outpatient Specialists:   Brief Narrative: 70 year old male with a medical history of CAD, DM, gout, heart failure with pacemaker-recently treated for presumed right lower extremity cellulitis with Keflex and Bactrim presented to the ED on 4/24 with worsening right lower extremity cellulitis.   Subjective: Pt is doing well, thinks that the pain in the right leg is slowly getting better. Decreased erythema in the right leg.   Assessment & Plan: Cellulitis of right leg:  improving, afebrile, decreased erythema in the right leg. Suspect underlying chronic venous stasis changes-has evidence of varicose veins-also has had a prior graft harvest or CABG from his right leg.  Doppler was negative for DVT. Xray of the right foot and right tib/fib with no evidence of osteomyelitis.  suspect he will benefit from compression-have consulted wound care's for unna boot.Continue intravenous vancomycin, will discontinue Zosyn. Suspected clinical improvement continues, home on 4/27.   Active Problems: Hypertension:Controlled on Coreg and Lisinopril. Continue and monitor.  Hyperlipidemia: Continue statin  Hx of CAD s/p CABG: No chest pain or shortness of breath. Suspect not on aspirin as on Coumadin, continue statin and beta blocker.  Type 2 diabetes: CBG stable with SSI, resume metformin and glipizide on discharge.  Diastolic Heart Failure: 2D Echo EF 55-60% in 2015: Clinically compensated-continue Lasix  Atrial fibrillation:. Rate controlled with coreg. INR therapeutic-pharmacy managing coumadin  History of complete heart block-status post permanent pacemaker placement  [id number] Flomax  DVT prophylaxis: Coumadin Code Status: Full Family Communication: No family at bedside Disposition Plan: Home with home health services on 4/27  Consultants:  None  Procedures:  None   Antimicrobials: IV  Vancomycin started on 4/24 IV Zosyn 4/24>>4/26   Objective: Filed Vitals:   05/25/15 1716 05/25/15 2120 05/26/15 0636 05/26/15 0839  BP: 122/80 111/84 125/71 128/68  Pulse: 63 61 63 60  Temp:  98.3 F (36.8 C) 98.6 F (37 C)   TempSrc:  Oral Oral   Resp: 20 18 18    Height:      Weight:   98.1 kg (216 lb 4.3 oz)   SpO2: 97% 97% 94%     Intake/Output Summary (Last 24 hours) at 05/26/15 1051 Last data filed at 05/25/15 1427  Gross per 24 hour  Intake    440 ml  Output      0 ml  Net    440 ml   Filed Weights   05/24/15 1503 05/25/15 0500 05/26/15 0636  Weight: 92 kg (202 lb 13.2 oz) 97.705 kg (215 lb 6.4 oz) 98.1 kg (216 lb 4.3 oz)    Examination:  Filed Vitals:   05/25/15 1716 05/25/15 2120 05/26/15 0636 05/26/15 0839  BP: 122/80 111/84 125/71 128/68  Pulse: 63 61 63 60  Temp:  98.3 F (36.8 C) 98.6 F (37 C)   TempSrc:  Oral Oral   Resp: 20 18 18    Height:      Weight:   98.1 kg (216 lb 4.3 oz)   SpO2: 97% 97% 94%    Constitutional: well nourished, well developed, NAD Eyes: PERRL Neck: supple, no JVD Respiratory: clear to auscultation bilaterally, no wheezing, no crackles. Normal respiratory effort. No accessory muscle use.  Cardiovascular: Regular rate and rhythm. Continues to have mild erythema to the right leg-but improving. She has 2+ pitting edema on the dorsum of his right foot Abdomen: no tenderness. Bowel sounds positive.  Neurologic: non focal Psychiatric: Normal  judgment and insight. Alert and oriented x 3. Normal mood.     Data Reviewed: I have personally reviewed following labs and imaging studies  CBC:  Recent Labs Lab 05/24/15 0944 05/25/15 0718  WBC 5.8 4.8  NEUTROABS 4.0  --   HGB 9.6* 9.3*  HCT 31.1* 29.4*  MCV 89.1 88.8  PLT 154 0000000   Basic Metabolic Panel:  Recent Labs Lab 05/24/15 0944 05/25/15 0718 05/26/15 0643  NA 140 140 140  K 4.8 3.9 4.1  CL 109 107 107  CO2 23 22 25   GLUCOSE 173* 94 158*  BUN 27* 19 16    CREATININE 0.95 0.86 0.90  CALCIUM 8.8* 8.8* 8.9   GFR: Estimated Creatinine Clearance: 92.5 mL/min (by C-G formula based on Cr of 0.9). Liver Function Tests:  Recent Labs Lab 05/25/15 0718  AST 30  ALT 30  ALKPHOS 63  BILITOT 0.7  PROT 6.1*  ALBUMIN 3.4*   Coagulation Profile:  Recent Labs Lab 05/24/15 0944 05/25/15 0718 05/26/15 0643  INR 1.80* 2.23* 2.46*   CBG:  Recent Labs Lab 05/25/15 0756 05/25/15 1134 05/25/15 1703 05/25/15 2137 05/26/15 0750  GLUCAP 90 208* 176* 215* 150*   Urine analysis:    Component Value Date/Time   COLORURINE YELLOW 02/24/2014 0858   APPEARANCEUR CLEAR 02/24/2014 0858   LABSPEC 1.020 02/24/2014 0858   PHURINE 6.0 02/24/2014 0858   GLUCOSEU 500* 02/24/2014 0858   GLUCOSEU NEGATIVE 12/27/2010 1349   HGBUR NEGATIVE 02/24/2014 0858   BILIRUBINUR NEGATIVE 02/24/2014 0858   KETONESUR NEGATIVE 02/24/2014 0858   PROTEINUR NEGATIVE 12/27/2010 1349   UROBILINOGEN 0.2 02/24/2014 0858   NITRITE NEGATIVE 02/24/2014 0858   LEUKOCYTESUR NEGATIVE 02/24/2014 0858   Radiology Studies: Dg Tibia/fibula Right  05/24/2015  CLINICAL DATA:  Redness And Swelling Mid Lower Leg To Top Of Foot diabetic EXAM: RIGHT TIBIA AND FIBULA - 2 VIEW COMPARISON:  None. FINDINGS: Numerous surgical clips in the medial soft tissues. No fracture or dislocation. No periosteal reaction. Small vessel calcification noted anteriorly. IMPRESSION: No acute findings Electronically Signed   By: Skipper Cliche M.D.   On: 05/24/2015 14:03   Dg Foot 2 Views Right  05/24/2015  CLINICAL DATA:  Diabetic with redness and swelling in the mid lower leg extending into the foot. EXAM: RIGHT FOOT - 2 VIEW COMPARISON:  Radiographs 06/24/2007. FINDINGS: The mineralization and alignment are normal. There is no evidence of acute fracture or dislocation. There are stable mild degenerative changes within the midfoot and at the first metatarsal phalangeal joint. There is moderate dorsal soft  tissue swelling. Diffuse vascular calcifications are noted. No evidence of radiopaque foreign body. IMPRESSION: Dorsal soft tissue swelling. No acute osseous findings or evidence of foreign body. Electronically Signed   By: Richardean Sale M.D.   On: 05/24/2015 14:03     Scheduled Meds: . allopurinol  300 mg Oral Daily  . atorvastatin  80 mg Oral q1800  . carvedilol  25 mg Oral BID WC  . furosemide  40 mg Oral Daily  . gabapentin  300 mg Oral TID  . insulin aspart  0-9 Units Subcutaneous TID WC  . lisinopril  2.5 mg Oral Daily  . potassium chloride SA  20 mEq Oral Daily  . tamsulosin  0.4 mg Oral QPC supper  . vancomycin  1,250 mg Intravenous Q12H  . Warfarin - Pharmacist Dosing Inpatient   Does not apply q1800    Time spent: 25 minutes   Oren Binet, MD [facility] Pager  [phone]  If 7PM-7AM, please contact night-coverage [email or web address] Password [facility] 05/26/2015, 10:51 AM

## 2015-05-26 NOTE — Progress Notes (Signed)
ANTICOAGULATION CONSULT NOTE - Follow Up Consult  Pharmacy Consult for Coumadin Indication: atrial fibrillation  Allergies  Allergen Reactions  . Crestor [Rosuvastatin Calcium] Other (See Comments)    Urination of blood    Patient Measurements: Height: 5\' 11"  (180.3 cm) Weight: 216 lb 4.3 oz (98.1 kg) IBW/kg (Calculated) : 75.3 Heparin Dosing Weight:   Vital Signs: Temp: 97.7 F (36.5 C) (04/26 1316) Temp Source: Oral (04/26 0636) BP: 118/67 mmHg (04/26 1316) Pulse Rate: 64 (04/26 1316)  Labs:  Recent Labs  05/24/15 0944 05/25/15 0718 05/26/15 0643  HGB 9.6* 9.3*  --   HCT 31.1* 29.4*  --   PLT 154 158  --   LABPROT 20.8* 24.5* 26.4*  INR 1.80* 2.23* 2.46*  CREATININE 0.95 0.86 0.90    Estimated Creatinine Clearance: 92.5 mL/min (by C-G formula based on Cr of 0.9).   Medications:  Scheduled:  . allopurinol  300 mg Oral Daily  . atorvastatin  80 mg Oral q1800  . carvedilol  25 mg Oral BID WC  . furosemide  40 mg Oral Daily  . gabapentin  300 mg Oral TID  . insulin aspart  0-9 Units Subcutaneous TID WC  . lisinopril  2.5 mg Oral Daily  . potassium chloride SA  20 mEq Oral Daily  . tamsulosin  0.4 mg Oral QPC supper  . vancomycin  1,250 mg Intravenous Q12H  . Warfarin - Pharmacist Dosing Inpatient   Does not apply q1800    Assessment: 70yo male with AFib.  Coumadin 2.5mg  daily exc for 5mg  on MWF for AFib.  Last home dose taken was 4/23. INR on admit was low at 1.8. Doppler (-)DVT. INR trending up to 2.46 after a bigger dose while inpatient. Hgb stable at 9.3, plts wnl. No s/s of bleed.  Goal of Therapy:  INR 2-3 Monitor platelets by anticoagulation protocol: Yes   Plan:  Coumadin 2.5mg  x 1 dose tonight Monitor daily INR, CBC, s/s of bleed  If discharged, consider changing home dose to 5mg  daily exc for 2.5mg  on MWF (Increasing PTA dose slightly since INR subtherapeutic on admission)  Elenor Quinones, PharmD, [facility] Clinical Pharmacist Pager  [phone] 05/26/2015 1:47 PM

## 2015-05-26 NOTE — Consult Note (Signed)
WOC: Called by bedside nurse for assistance with application of Una boot to RLE as ordered by primary team.  This is performed by ortho tech; will enter orders as requested and their team can change Q Wed as requested.  Pt will need home health assistance after discharge for dressing change assistance.  Please refer to primary team for further questions. Please re-consult if further assistance is needed.  Thank-you,  Julien Girt MSN, [place], [place], [place], [place]

## 2015-05-26 NOTE — Progress Notes (Signed)
Orthopedic Tech Progress Note Patient Details:  [name] November 01, 1945 [id number]  Ortho Devices Type of Ortho Device: Louretta Parma boot Ortho Device/Splint Location: rle Ortho Device/Splint Interventions: Application   Tifanie Gardiner 05/26/2015, 11:36 AM

## 2015-05-27 ENCOUNTER — Telehealth: Payer: Self-pay | Admitting: *Deleted

## 2015-05-27 LAB — PROTIME-INR
INR: 2.45 — ABNORMAL HIGH (ref 0.00–1.49)
PROTHROMBIN TIME: 26.3 s — AB (ref 11.6–15.2)

## 2015-05-27 LAB — GLUCOSE, CAPILLARY: GLUCOSE-CAPILLARY: 165 mg/dL — AB (ref 65–99)

## 2015-05-27 MED ORDER — DOXYCYCLINE HYCLATE 50 MG PO CAPS: 100.0000 mg | ORAL_CAPSULE | Freq: Two times a day (BID) | ORAL | Status: DC

## 2015-05-27 NOTE — Care Management Important Message (Signed)
Important Message  Patient Details  Name: [name] MRN: [id number] Date of Birth: 07-18-1945   Medicare Important Message Given:  Yes    Nathen May 05/27/2015, 1:55 PM

## 2015-05-27 NOTE — Discharge Summary (Signed)
PATIENT DETAILS Name: [name] Age: 70 y.o. Sex: male Date of Birth: 03/10/1945 MRN: [id number]. Admitting Physician: Waldemar Dickens, MD [id number] Jenny Reichmann, MD  Admit Date: 05/24/2015 Discharge date: 05/27/2015  Recommendations for Outpatient Follow-up:  1. Being discharged with home health RN-Unna boots 2. Complete 4 more days of doxycycline to complete 1 week course of antibiotics.  3. Please repeat CBC/BMET at next visit 4. Please follow blood cultures till final  PRIMARY DISCHARGE DIAGNOSIS:  Principal Problem:   Cellulitis of leg, right Active Problems:   Type 2 diabetes, uncontrolled, with neuropathy (HCC)   Hyperlipidemia   Essential hypertension   DIASTOLIC HEART FAILURE, CHRONIC   PACEMAKER, PERMANENT   Cellulitis   Cellulitis of right leg   Anemia      PAST MEDICAL HISTORY: Past Medical History  Diagnosis Date  . 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 HYPERLIPIDEMIA 08/29/2006    takes Atorvastatin daily  . GOUT 04/22/2007    takes Allopurinol daily  . DEPRESSION 08/29/2006  . 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 DIASTOLIC HEART FAILURE, CHRONIC 06/09/2008  . BENIGN PROSTATIC HYPERTROPHY 08/29/2006    takes Flomax daily  . LUMBAR RADICULOPATHY, RIGHT 06/10/2007  . INSOMNIA-SLEEP DISORDER-UNSPEC 10/23/2007  . PACEMAKER, PERMANENT 03/04/2008    pt denies this date  . Left lumbar radiculopathy 05/30/2010  . Sleep apnea     "if I lay flat I quit breathing; HOB up I'm fine"  . Arthritis     "lower back; going back down both my sciatic nerves"  . Aortic root dilatation ([place]) 02/02/2011  . HYPERTENSION 08/29/2006    takes Lisinopril daily  . Myocardial infarction ([place]) 12/27/10    "I've had several MIs"  . Muscle spasm     takes Zanaflex daily  . CHF (congestive heart failure) (HCC)     takes Lasix daily  . Shortness of breath dyspnea     "all my life" with exertion  . History of migraine     65yrs ago  . DIABETES MELLITUS, TYPE II  08/29/2006    takes Metformin,Januvia,and Glipizide  daily  . CORONARY ARTERY DISEASE 08/29/2006    takes Coumadin daily  . 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 Peripheral neuropathy (HCC)     takes Gabapentin daily  . Chronic back pain     HNP   . Cataracts, bilateral     immature  . Presence of permanent cardiac pacemaker     DISCHARGE MEDICATIONS: Current Discharge Medication List    START taking these medications   Details  doxycycline (VIBRAMYCIN) 50 MG capsule Take 2 capsules (100 mg total) by mouth 2 (two) times daily. Qty: 16 capsule, Refills: 0      CONTINUE these medications which have NOT CHANGED   Details  allopurinol (ZYLOPRIM) 300 MG tablet take 1 tablet by mouth once daily Qty: 90 tablet, Refills: 3    atorvastatin (LIPITOR) 80 MG tablet Take 1 tablet by mouth  daily Qty: 90 tablet, Refills: 1    carvedilol (COREG) 25 MG tablet Take 1 tablet by mouth two  times daily Qty: 180 tablet, Refills: 1    Cholecalciferol (VITAMIN D) 1000 UNITS capsule Take 1,000 Units by mouth daily. Reported on 02/15/2015    fluticasone (FLONASE) 50 MCG/ACT nasal spray Use 2 sprays in each  nostril every day Qty: 48 g, Refills: 1    furosemide (LASIX) 80 MG tablet Take 1 tablet (80 mg total) by mouth 2 (two) times daily. Qty: 60  tablet, Refills: 5    gabapentin (NEURONTIN) 300 MG capsule Take 2 capsules by mouth 3  times daily Qty: 540 capsule, Refills: 1    glipiZIDE (GLUCOTROL XL) 10 MG 24 hr tablet Take 1 tablet by mouth  daily with breakfast Qty: 90 tablet, Refills: 1    lisinopril (PRINIVIL,ZESTRIL) 2.5 MG tablet Take 1 tablet (2.5 mg total) by mouth daily. Qty: 90 tablet, Refills: 3    metFORMIN (GLUCOPHAGE) 500 MG tablet Take 2 tablets by mouth two times daily Qty: 360 tablet, Refills: 1    Multiple Vitamin (MULTIVITAMIN WITH MINERALS) TABS tablet Take 1 tablet by mouth every morning.    potassium chloride SA (K-DUR,KLOR-CON) 20 MEQ tablet Take 1 tablet by mouth   every evening Qty: 90 tablet, Refills: 1    sitaGLIPtin (JANUVIA) 100 MG tablet Take 1 tablet (100 mg total) by mouth daily. Qty: 90 tablet, Refills: 3    tamsulosin (FLOMAX) 0.4 MG CAPS capsule Take 1 capsule by mouth  every 12 hours Qty: 180 capsule, Refills: 1    tiZANidine (ZANAFLEX) 4 MG tablet TAKE 1 TABLET BY MOUTH  EVERY 6 HOURS AS NEEDED FOR MUSCLE SPASMS. Qty: 180 tablet, Refills: 1    warfarin (COUMADIN) 5 MG tablet Take 1 tablet by mouth daily or as directed by coumadin clinic Qty: 90 tablet, Refills: 1    HYDROcodone-acetaminophen (NORCO) 10-325 MG tablet Take 1-2 tablets by mouth every 4 (four) hours as needed. Qty: 80 tablet, Refills: 0    ibuprofen (ADVIL,MOTRIN) 200 MG tablet Take 800 mg by mouth daily as needed for mild pain or moderate pain.       STOP taking these medications     ONE TOUCH ULTRA TEST test strip      ONETOUCH DELICA LANCETS [id number] MISC         ALLERGIES:   Allergies  Allergen Reactions  . Crestor [Rosuvastatin Calcium] Other (See Comments)    Urination of blood    BRIEF HPI:  See H&P, Labs, Consult and Test reports for all details in brief, 70 year old male with a medical history of CAD, DM, gout, heart failure with pacemaker-recently treated for presumed right lower extremity cellulitis with Keflex and Bactrim presented to the ED on 4/24 with worsening right lower extremity cellulitis  CONSULTATIONS:   None  PERTINENT RADIOLOGIC STUDIES: Dg Tibia/fibula Right  05/24/2015  CLINICAL DATA:  Redness And Swelling Mid Lower Leg To Top Of Foot diabetic EXAM: RIGHT TIBIA AND FIBULA - 2 VIEW COMPARISON:  None. FINDINGS: Numerous surgical clips in the medial soft tissues. No fracture or dislocation. No periosteal reaction. Small vessel calcification noted anteriorly. IMPRESSION: No acute findings Electronically Signed   By: Skipper Cliche M.D.   On: 05/24/2015 14:03   Dg Foot 2 Views Right  05/24/2015  CLINICAL DATA:  Diabetic with redness  and swelling in the mid lower leg extending into the foot. EXAM: RIGHT FOOT - 2 VIEW COMPARISON:  Radiographs 06/24/2007. FINDINGS: The mineralization and alignment are normal. There is no evidence of acute fracture or dislocation. There are stable mild degenerative changes within the midfoot and at the first metatarsal phalangeal joint. There is moderate dorsal soft tissue swelling. Diffuse vascular calcifications are noted. No evidence of radiopaque foreign body. IMPRESSION: Dorsal soft tissue swelling. No acute osseous findings or evidence of foreign body. Electronically Signed   By: Richardean Sale M.D.   On: 05/24/2015 14:03     PERTINENT LAB RESULTS: CBC:  Recent Labs  05/25/15 0718  WBC 4.8  HGB 9.3*  HCT 29.4*  PLT 158   CMET CMP     Component Value Date/Time   NA 140 05/26/2015 0643   K 4.1 05/26/2015 0643   CL 107 05/26/2015 0643   CO2 25 05/26/2015 0643   GLUCOSE 158* 05/26/2015 0643   BUN 16 05/26/2015 0643   CREATININE 0.90 05/26/2015 0643   CREATININE 0.83 11/27/2013 0933   CALCIUM 8.9 05/26/2015 0643   PROT 6.1* 05/25/2015 0718   ALBUMIN 3.4* 05/25/2015 0718   AST 30 05/25/2015 0718   ALT 30 05/25/2015 0718   ALKPHOS 63 05/25/2015 0718   BILITOT 0.7 05/25/2015 0718   GFRNONAA >60 05/26/2015 0643   GFRNONAA >89 11/27/2013 0933   GFRAA >60 05/26/2015 0643   GFRAA >89 11/27/2013 0933    GFR Estimated Creatinine Clearance: 92.5 mL/min (by C-G formula based on Cr of 0.9). No results for input(s): LIPASE, AMYLASE in the last 72 hours. No results for input(s): CKTOTAL, CKMB, CKMBINDEX, TROPONINI in the last 72 hours. Invalid input(s): POCBNP No results for input(s): DDIMER in the last 72 hours. No results for input(s): HGBA1C in the last 72 hours. No results for input(s): CHOL, HDL, LDLCALC, TRIG, CHOLHDL, LDLDIRECT in the last 72 hours. No results for input(s): TSH, T4TOTAL, T3FREE, THYROIDAB in the last 72 hours.  Invalid input(s): FREET3 No results for  input(s): VITAMINB12, FOLATE, FERRITIN, TIBC, IRON, RETICCTPCT in the last 72 hours. Coags:  Recent Labs  05/26/15 0643 05/27/15 0654  INR 2.46* 2.45*   Microbiology: Recent Results (from the past 240 hour(s))  Culture, blood (routine x 2)     Status: None (Preliminary result)   Collection Time: 05/24/15 12:51 PM  Result Value Ref Range Status   Specimen Description BLOOD RIGHT WRIST  Final   Special Requests IN PEDIATRIC BOTTLE 3CC  Final   Culture NO GROWTH 2 DAYS  Final   Report Status PENDING  Incomplete  Culture, blood (routine x 2)     Status: None (Preliminary result)   Collection Time: 05/24/15  1:13 PM  Result Value Ref Range Status   Specimen Description BLOOD RIGHT HAND  Final   Special Requests BOTTLES DRAWN AEROBIC AND ANAEROBIC 5CC  Final   Culture NO GROWTH 2 DAYS  Final   Report Status PENDING  Incomplete     BRIEF HOSPITAL COURSE:  Cellulitis of right leg: Much improved with intravenous vancomycin, remains afebrile and erythema has decreased dramatically.Suspect underlying chronic venous stasis changes-has evidence of varicose veins-also has had a prior graft harvest or CABG from his right leg. Doppler was negative for DVT. Xray of the right foot and right tib/fib with no evidence of osteomyelitis.Wound care evaluation was completed, and patient was subsequently placed on unna boot.since clinically improved, we will transition to doxycycline to continue for 4 more days to complete a one-week course of antibiotics. Home health RN has been arranged by case management.   Active Problems: Hypertension:Controlled on Coreg and Lisinopril.  Hyperlipidemia: Continue statin  Hx of CAD s/p CABG: No chest pain or shortness of breath. Suspect not on aspirin as on Coumadin, continue statin and beta blocker.  Type 2 diabetes: CBG stable with SSI while inpatient, resume metformin and glipizide on discharge.  Diastolic Heart Failure: 2D Echo EF 55-60% in 2015: Clinically  compensated-continue Lasix  Atrial fibrillation:. Rate controlled with coreg. INR therapeutic-continue Coumadin  History of complete heart block-status post permanent pacemaker placement  [id number] Flomax  TODAY-DAY OF DISCHARGE:  Subjective:   [name] today has no headache,no chest abdominal pain,no new weakness tingling or numbness, feels much better wants to go home today.   Objective:   Blood pressure 133/83, pulse 61, temperature 97.6 F (36.4 C), temperature source Oral, resp. rate 16, height 5\' 11"  (1.803 m), weight 98.1 kg (216 lb 4.3 oz), SpO2 98 %.  Intake/Output Summary (Last 24 hours) at 05/27/15 1042 Last data filed at 05/26/15 1300  Gross per 24 hour  Intake    240 ml  Output      0 ml  Net    240 ml   Filed Weights   05/24/15 1503 05/25/15 0500 05/26/15 0636  Weight: 92 kg (202 lb 13.2 oz) 97.705 kg (215 lb 6.4 oz) 98.1 kg (216 lb 4.3 oz)    Exam Awake Alert, Oriented *3, No new F.N deficits, Normal affect [place].AT,PERRAL Supple Neck,No JVD, No cervical lymphadenopathy appriciated.  Symmetrical Chest wall movement, Good air movement bilaterally, CTAB RRR,No Gallops,Rubs or new Murmurs, No Parasternal Heave +ve B.Sounds, Abd Soft, Non tender, No organomegaly appriciated, No rebound -guarding or rigidity. No Cyanosis, Clubbing or edema, No new Rash or bruise  DISCHARGE CONDITION: Stable  DISPOSITION: HomeWith home health services  DISCHARGE INSTRUCTIONS:    Activity:  As tolerated   Get Medicines reviewed and adjusted: Please take all your medications with you for your next visit with your Primary MD  Please request your Primary MD to go over all hospital tests and procedure/radiological results at the follow up, please ask your Primary MD to get all Hospital records sent to his/her office.  If you experience worsening of your admission symptoms, develop shortness of breath, life threatening emergency, suicidal or homicidal thoughts you must  seek medical attention immediately by calling 911 or calling your MD immediately  if symptoms less severe.  You must read complete instructions/literature along with all the possible adverse reactions/side effects for all the Medicines you take and that have been prescribed to you. Take any new Medicines after you have completely understood and accpet all the possible adverse reactions/side effects.   Do not drive when taking Pain medications.   Do not take more than prescribed Pain, Sleep and Anxiety Medications  Special Instructions: If you have smoked or chewed Tobacco  in the last 2 yrs please stop smoking, stop any regular Alcohol  and or any Recreational drug use.  Wear Seat belts while driving.  Please note  You were cared for by a hospitalist during your hospital stay. Once you are discharged, your primary care physician will handle any further medical issues. Please note that NO REFILLS for any discharge medications will be authorized once you are discharged, as it is imperative that you return to your primary care physician (or establish a relationship with a primary care physician if you do not have one) for your aftercare needs so that they can reassess your need for medications and monitor your lab values.   Diet recommendation: Diabetic Diet Heart Healthy diet  Discharge Instructions    Call MD for:  redness, tenderness, or signs of infection (pain, swelling, redness, odor or green/yellow discharge around incision site)    Complete by:  As directed      Diet - low sodium heart healthy    Complete by:  As directed      Diet Carb Modified    Complete by:  As directed      Increase activity slowly    Complete by:  As  directed            Follow-up Information    Follow up with Cathlean Cower, MD. Schedule an appointment as soon as possible for a visit in 1 week.   Specialties:  Internal Medicine, Radiology   Why:  Hospital follow up   Contact information:   [place] [address] [phone]       Total Time spent on discharge equals  45 minutes.  SignedOren Binet 05/27/2015 10:42 AM

## 2015-05-27 NOTE — Progress Notes (Signed)
[name] to be D/C'd to home with home health per MD order.  Discussed with the patient and all questions fully answered.  VSS, Skin clean, dry and intact without evidence of skin break down, no evidence of skin tears noted. IV catheter discontinued intact. Site without signs and symptoms of complications. Dressing and pressure applied.  An After Visit Summary was printed and given to the patient. Patient received prescription.  D/c education completed with patient/family including follow up instructions, medication list, d/c activities limitations if indicated, with other d/c instructions as indicated by MD - patient able to verbalize understanding, all questions fully answered.   Patient instructed to return to ED, call 911, or call MD for any changes in condition.   Patient escorted via [name], and D/C home via private auto.  [name] 05/27/2015 1:00 PM

## 2015-05-27 NOTE — Telephone Encounter (Signed)
Transition Care Management Follow-up Telephone Call   Date discharged? 05/27/15   How have you been since you were released from the hospital?  Pt states he is ok just came home this morning, but he feel pretty good   Do you understand why you were in the hospital? YES   Do you understand the discharge instructions? YES   Where were you discharged to? Home   Items Reviewed:  Medications reviewed: YES  Allergies reviewed: YES  Dietary changes reviewed: NO  Referrals reviewed: YES, he states no one has contacted him yet concerning HH, inform him someone should be calling referral was place at d/c   Functional Questionnaire:   Activities of Daily Living (ADLs):   He states he are independent in the following: ambulation, bathing and hygiene, feeding, continence, grooming, toileting and dressing States he require assistance with the following: ambulation   Any transportation issues/concerns?: NO   Any patient concerns? NO   Confirmed importance and date/time of follow-up visits scheduled YES, appt 06/02/15  Provider Appointment booked with [name]  Confirmed with patient if condition begins to worsen call PCP or go to the ER.  Patient was given the office number and encouraged to call back with question or concerns.  : YES

## 2015-05-28 ENCOUNTER — Telehealth: Payer: Self-pay | Admitting: Cardiology

## 2015-05-28 ENCOUNTER — Encounter: Payer: Medicare Other | Admitting: Pharmacist Clinician (PhC)/ Clinical Pharmacy Specialist

## 2015-05-28 DIAGNOSIS — K219 Gastro-esophageal reflux disease without esophagitis: Secondary | ICD-10-CM | POA: Diagnosis not present

## 2015-05-28 DIAGNOSIS — M109 Gout, unspecified: Secondary | ICD-10-CM | POA: Diagnosis not present

## 2015-05-28 DIAGNOSIS — I872 Venous insufficiency (chronic) (peripheral): Secondary | ICD-10-CM | POA: Diagnosis not present

## 2015-05-28 DIAGNOSIS — G4730 Sleep apnea, unspecified: Secondary | ICD-10-CM | POA: Diagnosis not present

## 2015-05-28 DIAGNOSIS — L03115 Cellulitis of right lower limb: Secondary | ICD-10-CM | POA: Diagnosis not present

## 2015-05-28 DIAGNOSIS — E1165 Type 2 diabetes mellitus with hyperglycemia: Secondary | ICD-10-CM | POA: Diagnosis not present

## 2015-05-28 DIAGNOSIS — N400 Enlarged prostate without lower urinary tract symptoms: Secondary | ICD-10-CM | POA: Diagnosis not present

## 2015-05-28 DIAGNOSIS — Z950 Presence of cardiac pacemaker: Secondary | ICD-10-CM | POA: Diagnosis not present

## 2015-05-28 DIAGNOSIS — I2510 Atherosclerotic heart disease of native coronary artery without angina pectoris: Secondary | ICD-10-CM | POA: Diagnosis not present

## 2015-05-28 DIAGNOSIS — Z951 Presence of aortocoronary bypass graft: Secondary | ICD-10-CM | POA: Diagnosis not present

## 2015-05-28 DIAGNOSIS — I110 Hypertensive heart disease with heart failure: Secondary | ICD-10-CM | POA: Diagnosis not present

## 2015-05-28 DIAGNOSIS — E785 Hyperlipidemia, unspecified: Secondary | ICD-10-CM | POA: Diagnosis not present

## 2015-05-28 DIAGNOSIS — Z7901 Long term (current) use of anticoagulants: Secondary | ICD-10-CM | POA: Diagnosis not present

## 2015-05-28 DIAGNOSIS — I252 Old myocardial infarction: Secondary | ICD-10-CM | POA: Diagnosis not present

## 2015-05-28 DIAGNOSIS — I482 Chronic atrial fibrillation: Secondary | ICD-10-CM | POA: Diagnosis not present

## 2015-05-28 DIAGNOSIS — G8929 Other chronic pain: Secondary | ICD-10-CM | POA: Diagnosis not present

## 2015-05-28 DIAGNOSIS — I5032 Chronic diastolic (congestive) heart failure: Secondary | ICD-10-CM | POA: Diagnosis not present

## 2015-05-28 DIAGNOSIS — M549 Dorsalgia, unspecified: Secondary | ICD-10-CM | POA: Diagnosis not present

## 2015-05-28 DIAGNOSIS — Z7984 Long term (current) use of oral hypoglycemic drugs: Secondary | ICD-10-CM | POA: Diagnosis not present

## 2015-05-28 DIAGNOSIS — L97919 Non-pressure chronic ulcer of unspecified part of right lower leg with unspecified severity: Secondary | ICD-10-CM | POA: Diagnosis not present

## 2015-05-28 DIAGNOSIS — E1140 Type 2 diabetes mellitus with diabetic neuropathy, unspecified: Secondary | ICD-10-CM | POA: Diagnosis not present

## 2015-05-28 NOTE — Telephone Encounter (Signed)
NEw MEssage  Rn from [facility] calling to make Dr Stanford Breed aware- Coreg and Coumadin werwe in separate unlabelled bottles- Rn was unable to check meds. Please call back and discuss.

## 2015-05-28 NOTE — Progress Notes (Signed)
Verified [facility] referral made

## 2015-05-28 NOTE — Telephone Encounter (Signed)
Spoke to [facility]. This is a patient in [facility] high risk pool. She came to see pt for post-discharge home visit, noted his coreg and coumadin are in old unlabeled pill bottles. Not clear why pt chose to store meds this way. She did reinforce importance of keeping bottles appropriately labeled.  Asked if pt was clear on which medication was which, she states he is. I noted according to pt's recent coumadin checks, he is taking appropriate doses. However, he is not yet set up for a post-hosp coumadin check. Last check was yesterday prior to discharge and INR was 2.45 then. His therapeutic range is 2.0-3.0.  Maria aware I will send request to coumadin clinic for any advice and that our office can reach out to patient to schedule a follow up.

## 2015-05-29 LAB — CULTURE, BLOOD (ROUTINE X 2)
CULTURE: NO GROWTH
Culture: NO GROWTH

## 2015-05-31 NOTE — Telephone Encounter (Signed)
Called to have patient scheduled for INR follow-up. Pt stated he is unable to drive for 4 weeks, but will have a nurse from [facility] coming to the house starting later this week, as well as [facility]. Advised patient's wife that we will need to have them check an INR since he was recently hospitalized. She stated she would call the office once homehealth was set-up.

## 2015-06-02 ENCOUNTER — Ambulatory Visit (INDEPENDENT_AMBULATORY_CARE_PROVIDER_SITE_OTHER): Payer: Medicare Other | Admitting: Internal Medicine

## 2015-06-02 ENCOUNTER — Telehealth: Payer: Self-pay

## 2015-06-02 ENCOUNTER — Encounter: Payer: Self-pay | Admitting: Internal Medicine

## 2015-06-02 VITALS — BP 128/72 | HR 93 | Temp 97.7°F | Resp 20 | Wt 215.0 lb

## 2015-06-02 DIAGNOSIS — D649 Anemia, unspecified: Secondary | ICD-10-CM | POA: Diagnosis not present

## 2015-06-02 DIAGNOSIS — K219 Gastro-esophageal reflux disease without esophagitis: Secondary | ICD-10-CM | POA: Diagnosis not present

## 2015-06-02 DIAGNOSIS — L03115 Cellulitis of right lower limb: Secondary | ICD-10-CM

## 2015-06-02 DIAGNOSIS — I252 Old myocardial infarction: Secondary | ICD-10-CM | POA: Diagnosis not present

## 2015-06-02 DIAGNOSIS — I10 Essential (primary) hypertension: Secondary | ICD-10-CM | POA: Diagnosis not present

## 2015-06-02 DIAGNOSIS — M549 Dorsalgia, unspecified: Secondary | ICD-10-CM | POA: Diagnosis not present

## 2015-06-02 DIAGNOSIS — I872 Venous insufficiency (chronic) (peripheral): Secondary | ICD-10-CM | POA: Diagnosis not present

## 2015-06-02 DIAGNOSIS — E1165 Type 2 diabetes mellitus with hyperglycemia: Secondary | ICD-10-CM | POA: Diagnosis not present

## 2015-06-02 DIAGNOSIS — Z950 Presence of cardiac pacemaker: Secondary | ICD-10-CM | POA: Diagnosis not present

## 2015-06-02 DIAGNOSIS — L97919 Non-pressure chronic ulcer of unspecified part of right lower leg with unspecified severity: Secondary | ICD-10-CM | POA: Diagnosis not present

## 2015-06-02 DIAGNOSIS — I482 Chronic atrial fibrillation: Secondary | ICD-10-CM | POA: Diagnosis not present

## 2015-06-02 DIAGNOSIS — E785 Hyperlipidemia, unspecified: Secondary | ICD-10-CM | POA: Diagnosis not present

## 2015-06-02 DIAGNOSIS — I110 Hypertensive heart disease with heart failure: Secondary | ICD-10-CM | POA: Diagnosis not present

## 2015-06-02 DIAGNOSIS — Z7901 Long term (current) use of anticoagulants: Secondary | ICD-10-CM | POA: Diagnosis not present

## 2015-06-02 DIAGNOSIS — I5032 Chronic diastolic (congestive) heart failure: Secondary | ICD-10-CM | POA: Diagnosis not present

## 2015-06-02 DIAGNOSIS — R21 Rash and other nonspecific skin eruption: Secondary | ICD-10-CM

## 2015-06-02 DIAGNOSIS — M109 Gout, unspecified: Secondary | ICD-10-CM | POA: Diagnosis not present

## 2015-06-02 DIAGNOSIS — I2510 Atherosclerotic heart disease of native coronary artery without angina pectoris: Secondary | ICD-10-CM | POA: Diagnosis not present

## 2015-06-02 DIAGNOSIS — Z7984 Long term (current) use of oral hypoglycemic drugs: Secondary | ICD-10-CM | POA: Diagnosis not present

## 2015-06-02 DIAGNOSIS — G4730 Sleep apnea, unspecified: Secondary | ICD-10-CM | POA: Diagnosis not present

## 2015-06-02 DIAGNOSIS — E1140 Type 2 diabetes mellitus with diabetic neuropathy, unspecified: Secondary | ICD-10-CM | POA: Diagnosis not present

## 2015-06-02 DIAGNOSIS — N400 Enlarged prostate without lower urinary tract symptoms: Secondary | ICD-10-CM | POA: Diagnosis not present

## 2015-06-02 DIAGNOSIS — Z951 Presence of aortocoronary bypass graft: Secondary | ICD-10-CM | POA: Diagnosis not present

## 2015-06-02 DIAGNOSIS — G8929 Other chronic pain: Secondary | ICD-10-CM | POA: Diagnosis not present

## 2015-06-02 MED ORDER — TRIAMCINOLONE ACETONIDE 0.1 % EX CREA: 1.0000 "application " | TOPICAL_CREAM | Freq: Two times a day (BID) | CUTANEOUS | Status: DC

## 2015-06-02 NOTE — Telephone Encounter (Signed)
Spoke to [employer] at [facility] at [phone] [phone]. She will have INR drawn at next home visit on 5/10 and called to our office at [phone].

## 2015-06-02 NOTE — Progress Notes (Signed)
Pre visit review using our clinic review tool, if applicable. No additional management support is needed unless otherwise documented below in the visit note. 

## 2015-06-02 NOTE — Telephone Encounter (Signed)
Per pt wife [facility] will be coming to the home on Wednesdays - she states the nurse is Charna Busman and that she told her to have someone from our office call their office but did not give her a number. From my understanding usually [facility] contacts our office when they have a pt on warfarin. I will try to call them to see if we can arrange for in home INRs until he is released from [facility] care.   LM with Larose Hires, nursing clinical director about getting pt set-up for INR checks

## 2015-06-02 NOTE — Patient Instructions (Signed)
Please take all new medication as prescribed  - the cream for the rash  Please continue all other medications as before, and refills have been done if requested.  Please have the pharmacy call with any other refills you may need.  Please keep your appointments with your specialists as you may have planned  Please return in 3 months, or sooner if needed

## 2015-06-03 DIAGNOSIS — G4730 Sleep apnea, unspecified: Secondary | ICD-10-CM | POA: Diagnosis not present

## 2015-06-03 DIAGNOSIS — I2510 Atherosclerotic heart disease of native coronary artery without angina pectoris: Secondary | ICD-10-CM | POA: Diagnosis not present

## 2015-06-03 DIAGNOSIS — K219 Gastro-esophageal reflux disease without esophagitis: Secondary | ICD-10-CM | POA: Diagnosis not present

## 2015-06-03 DIAGNOSIS — M109 Gout, unspecified: Secondary | ICD-10-CM | POA: Diagnosis not present

## 2015-06-03 DIAGNOSIS — Z7984 Long term (current) use of oral hypoglycemic drugs: Secondary | ICD-10-CM | POA: Diagnosis not present

## 2015-06-03 DIAGNOSIS — L03115 Cellulitis of right lower limb: Secondary | ICD-10-CM | POA: Diagnosis not present

## 2015-06-03 DIAGNOSIS — Z7901 Long term (current) use of anticoagulants: Secondary | ICD-10-CM | POA: Diagnosis not present

## 2015-06-03 DIAGNOSIS — E785 Hyperlipidemia, unspecified: Secondary | ICD-10-CM | POA: Diagnosis not present

## 2015-06-03 DIAGNOSIS — I5032 Chronic diastolic (congestive) heart failure: Secondary | ICD-10-CM | POA: Diagnosis not present

## 2015-06-03 DIAGNOSIS — L97919 Non-pressure chronic ulcer of unspecified part of right lower leg with unspecified severity: Secondary | ICD-10-CM | POA: Diagnosis not present

## 2015-06-03 DIAGNOSIS — I252 Old myocardial infarction: Secondary | ICD-10-CM | POA: Diagnosis not present

## 2015-06-03 DIAGNOSIS — I482 Chronic atrial fibrillation: Secondary | ICD-10-CM | POA: Diagnosis not present

## 2015-06-03 DIAGNOSIS — N400 Enlarged prostate without lower urinary tract symptoms: Secondary | ICD-10-CM | POA: Diagnosis not present

## 2015-06-03 DIAGNOSIS — M549 Dorsalgia, unspecified: Secondary | ICD-10-CM | POA: Diagnosis not present

## 2015-06-03 DIAGNOSIS — Z951 Presence of aortocoronary bypass graft: Secondary | ICD-10-CM | POA: Diagnosis not present

## 2015-06-03 DIAGNOSIS — I110 Hypertensive heart disease with heart failure: Secondary | ICD-10-CM | POA: Diagnosis not present

## 2015-06-03 DIAGNOSIS — E1140 Type 2 diabetes mellitus with diabetic neuropathy, unspecified: Secondary | ICD-10-CM | POA: Diagnosis not present

## 2015-06-03 DIAGNOSIS — E1165 Type 2 diabetes mellitus with hyperglycemia: Secondary | ICD-10-CM | POA: Diagnosis not present

## 2015-06-03 DIAGNOSIS — I872 Venous insufficiency (chronic) (peripheral): Secondary | ICD-10-CM | POA: Diagnosis not present

## 2015-06-03 DIAGNOSIS — Z950 Presence of cardiac pacemaker: Secondary | ICD-10-CM | POA: Diagnosis not present

## 2015-06-03 DIAGNOSIS — G8929 Other chronic pain: Secondary | ICD-10-CM | POA: Diagnosis not present

## 2015-06-06 DIAGNOSIS — R21 Rash and other nonspecific skin eruption: Secondary | ICD-10-CM | POA: Insufficient documentation

## 2015-06-06 NOTE — Assessment & Plan Note (Signed)
Lab Results  Component Value Date   WBC 4.8 05/25/2015   HGB 9.3* 05/25/2015   HCT 29.4* 05/25/2015   MCV 88.8 05/25/2015   PLT 158 05/25/2015   Declines f/u lab today,  to f/u any worsening symptoms or concerns

## 2015-06-06 NOTE — Assessment & Plan Note (Signed)
?   Contact vs allergic - for triam cr prn,  to f/u any worsening symptoms or concerns

## 2015-06-06 NOTE — Progress Notes (Signed)
Subjective:    Patient ID: [name], male    DOB: 07/01/1945, 70 y.o.   MRN: [id number]  HPI  Here to f/u recent hospn 4/24-4/27, with cellulitis right leg requiring IV antbx; Much improved with intravenous vancomycin, remains afebrile and erythema has decreased dramatically.Suspect underlying chronic venous stasis changes-has evidence of varicose veins-also has had a prior graft harvest or CABG from his right leg. Doppler was negative for DVT. Xray of the right foot and right tib/fib with no evidence of osteomyelitis.Wound care evaluation was completed, and patient was subsequently placed on unna boot.since clinically improved, and d/c on po doxycycline to continue for 4 more days to complete a one-week course of antibiotics. Now with unna boots and working well for swelling.  No worsening red/tender/swelling on po doxycycline.  Does incidentally have a scaly nontender mild itchy rash to left hand, right hand, left elbow, asks for steroid cream that has worked well in the past.  Was noted to have anemia , with recommendation for f/u cbc today, pt declines labs today No overt bleeding. Past Medical History  Diagnosis Date  . 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 HYPERLIPIDEMIA 08/29/2006    takes Atorvastatin daily  . GOUT 04/22/2007    takes Allopurinol daily  . DEPRESSION 08/29/2006  . Atrioventricular block, complete ([place]) 09/04/2008  . DIASTOLIC HEART FAILURE, CHRONIC 06/09/2008  . BENIGN PROSTATIC HYPERTROPHY 08/29/2006    takes Flomax daily  . LUMBAR RADICULOPATHY, RIGHT 06/10/2007  . INSOMNIA-SLEEP DISORDER-UNSPEC 10/23/2007  . PACEMAKER, PERMANENT 03/04/2008    pt denies this date  . Left lumbar radiculopathy 05/30/2010  . Sleep apnea     "if I lay flat I quit breathing; HOB up I'm fine"  . Arthritis     "lower back; going back down both my sciatic nerves"  . Aortic root dilatation ([place]) 02/02/2011  . HYPERTENSION 08/29/2006    takes Lisinopril daily  . Myocardial infarction  ([place]) 12/27/10    "I've had several MIs"  . Muscle spasm     takes Zanaflex daily  . CHF (congestive heart failure) (HCC)     takes Lasix daily  . Shortness of breath dyspnea     "all my life" with exertion  . History of migraine     52yrs ago  . DIABETES MELLITUS, TYPE II 08/29/2006    takes Metformin,Januvia,and Glipizide  daily  . CORONARY ARTERY DISEASE 08/29/2006    takes Coumadin daily  . 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 Peripheral neuropathy (HCC)     takes Gabapentin daily  . Chronic back pain     HNP   . Cataracts, bilateral     immature  . Presence of permanent cardiac pacemaker    Past Surgical History  Procedure Laterality Date  . S/p left arm surgury after work accident  1991    "2000# steel fell on it"  . S/p right hand surgury for foreign object  1970's    "piece of wood went in my hand; had to get that out"  . Insert / replace / remove pacemaker  ~ 2004    initial pacemaker placement  . Insert / replace / remove pacemaker  10/2009    generator change  . Colonoscopy    . Esophagogastroduodenoscopy N/A 12/01/2013    Procedure: ESOPHAGOGASTRODUODENOSCOPY (EGD);  Surgeon: Lafayette Dragon, MD;  Location: [facility];  Service: Endoscopy;  Laterality: N/A;  . Coronary artery bypass graft  1992    CABG X 2  .  Coronary angioplasty with stent placement  12/27/10    "I've had a total of 9 cardiac stents put in"  . Lumbar laminectomy/decompression microdiscectomy Right 03/23/2014    Procedure: LAMINECTOMY AND FORAMINOTOMY RIGHT LUMBAR THREE-FOUR,LUMBAR FOUR-FIVE, LUMBAR FIVE-SACRAL ONE;  Surgeon: Charlie Pitter, MD;  Location: [place] [facility];  Service: Neurosurgery;  Laterality: Right;  right  . Lumbar laminectomy/decompression microdiscectomy Left 12/01/2014    Procedure: Left Lumbar Three-Four, Lumbar Four-Five Laminectomy and Foraminotomy;  Surgeon: Earnie Larsson, MD;  Location: [place] [facility];  Service: Neurosurgery;  Laterality: Left;    reports that he quit smoking  about 42 years ago. His smoking use included Cigarettes. He has a 27 pack-year smoking history. He has quit using smokeless tobacco. He reports that he does not drink alcohol or use illicit drugs. family history includes Coronary artery disease (age of onset: 36) in his other; Diabetes in his mother, other, and sister; Heart disease in his sister and sister; Lung cancer in his sister. Allergies  Allergen Reactions  . Crestor [Rosuvastatin Calcium] Other (See Comments)    Urination of blood   Current Outpatient Prescriptions on File Prior to Visit  Medication Sig Dispense Refill  . allopurinol (ZYLOPRIM) 300 MG tablet take 1 tablet by mouth once daily (Patient taking differently: Take 300 mg by mouth daily. ) 90 tablet 3  . atorvastatin (LIPITOR) 80 MG tablet Take 1 tablet by mouth  daily 90 tablet 1  . carvedilol (COREG) 25 MG tablet Take 1 tablet by mouth two  times daily 180 tablet 1  . Cholecalciferol (VITAMIN D) 1000 UNITS capsule Take 1,000 Units by mouth daily. Reported on 02/15/2015    . doxycycline (VIBRAMYCIN) 50 MG capsule Take 2 capsules (100 mg total) by mouth 2 (two) times daily. 16 capsule 0  . fluticasone (FLONASE) 50 MCG/ACT nasal spray Use 2 sprays in each  nostril every day 48 g 1  . furosemide (LASIX) 80 MG tablet Take 1 tablet (80 mg total) by mouth 2 (two) times daily. 60 tablet 5  . gabapentin (NEURONTIN) 300 MG capsule Take 2 capsules by mouth 3  times daily 540 capsule 1  . glipiZIDE (GLUCOTROL XL) 10 MG 24 hr tablet Take 1 tablet by mouth  daily with breakfast 90 tablet 1  . ibuprofen (ADVIL,MOTRIN) 200 MG tablet Take 800 mg by mouth daily as needed for mild pain or moderate pain.     Marland Kitchen lisinopril (PRINIVIL,ZESTRIL) 2.5 MG tablet Take 1 tablet (2.5 mg total) by mouth daily. 90 tablet 3  . metFORMIN (GLUCOPHAGE) 500 MG tablet Take 2 tablets by mouth two times daily 360 tablet 1  . Multiple Vitamin (MULTIVITAMIN WITH MINERALS) TABS tablet Take 1 tablet by mouth every  morning.    . potassium chloride SA (K-DUR,KLOR-CON) 20 MEQ tablet Take 1 tablet by mouth  every evening 90 tablet 1  . sitaGLIPtin (JANUVIA) 100 MG tablet Take 1 tablet (100 mg total) by mouth daily. (Patient taking differently: Take 100 mg by mouth every evening. ) 90 tablet 3  . tamsulosin (FLOMAX) 0.4 MG CAPS capsule Take 1 capsule by mouth  every 12 hours 180 capsule 1  . tiZANidine (ZANAFLEX) 4 MG tablet TAKE 1 TABLET BY MOUTH  EVERY 6 HOURS AS NEEDED FOR MUSCLE SPASMS. 180 tablet 1  . warfarin (COUMADIN) 5 MG tablet Take 1 tablet by mouth daily or as directed by coumadin clinic (Patient taking differently: Take 5 mg by mouth as directed. Take 1 tablet by mouth daily on  Monday ,Wednesday, Friday. 2.5 mg Tuesday, Thursday, Saturday, Sunday.) 90 tablet 1   No current facility-administered medications on file prior to visit.   Review of Systems  Constitutional: Negative for unusual diaphoresis or night sweats HENT: Negative for ear swelling or discharge Eyes: Negative for worsening visual haziness  Respiratory: Negative for choking and stridor.   Gastrointestinal: Negative for distension or worsening eructation Genitourinary: Negative for retention or change in urine volume.  Musculoskeletal: Negative for other MSK pain or swelling Skin: Negative for color change and worsening wound Neurological: Negative for tremors and numbness other than noted  Psychiatric/Behavioral: Negative for decreased concentration or agitation other than above       Objective:   Physical Exam BP 128/72 mmHg  Pulse 93  Temp(Src) 97.7 F (36.5 C) (Oral)  Resp 20  Wt 215 lb (97.523 kg)  SpO2 94% VS noted,  Constitutional: Pt appears in no apparent distress HENT: Head: NCAT.  Right Ear: External ear normal.  Left Ear: External ear normal.  Eyes: . Pupils are equal, round, and reactive to light. Conjunctivae and EOM are normal Neck: Normal range of motion. Neck supple.  Cardiovascular: Normal rate and  regular rhythm.   Pulmonary/Chest: Effort normal and breath sounds without rales or wheezing.  Neurological: Pt is alert. Not confused , motor grossly intact Skin: Skin is warm. Has ? Contact like rash to bilat hands and left elbow rash - NT, slightly scaly, trace erythema and RLE edema, no fluctuance or drainage Psychiatric: Pt behavior is normal. No agitation.     Assessment & Plan:

## 2015-06-06 NOTE — Assessment & Plan Note (Signed)
stable overall by history and exam, recent data reviewed with pt, and pt to continue medical treatment as before,  to f/u any worsening symptoms or concerns BP Readings from Last 3 Encounters:  06/02/15 128/72  05/27/15 133/83  05/21/15 120/70

## 2015-06-06 NOTE — Assessment & Plan Note (Signed)
stable overall by history and exam, recent data reviewed with pt, and pt to continue medical treatment as before,  to f/u any worsening symptoms or concerns , Lab Results  Component Value Date   HGBA1C 8.1* 02/24/2014

## 2015-06-06 NOTE — Assessment & Plan Note (Signed)
Improved, s/p 10 days antibx, doing well, no s/s of recurrence, to cont unna boots for edema as needed,  to f/u any worsening symptoms or concerns

## 2015-06-09 ENCOUNTER — Ambulatory Visit (INDEPENDENT_AMBULATORY_CARE_PROVIDER_SITE_OTHER): Payer: Medicare Other | Admitting: Pharmacist

## 2015-06-09 DIAGNOSIS — E1165 Type 2 diabetes mellitus with hyperglycemia: Secondary | ICD-10-CM | POA: Diagnosis not present

## 2015-06-09 DIAGNOSIS — I252 Old myocardial infarction: Secondary | ICD-10-CM | POA: Diagnosis not present

## 2015-06-09 DIAGNOSIS — Z7984 Long term (current) use of oral hypoglycemic drugs: Secondary | ICD-10-CM | POA: Diagnosis not present

## 2015-06-09 DIAGNOSIS — I2510 Atherosclerotic heart disease of native coronary artery without angina pectoris: Secondary | ICD-10-CM | POA: Diagnosis not present

## 2015-06-09 DIAGNOSIS — Z7901 Long term (current) use of anticoagulants: Secondary | ICD-10-CM | POA: Diagnosis not present

## 2015-06-09 DIAGNOSIS — K219 Gastro-esophageal reflux disease without esophagitis: Secondary | ICD-10-CM | POA: Diagnosis not present

## 2015-06-09 DIAGNOSIS — M109 Gout, unspecified: Secondary | ICD-10-CM | POA: Diagnosis not present

## 2015-06-09 DIAGNOSIS — I110 Hypertensive heart disease with heart failure: Secondary | ICD-10-CM | POA: Diagnosis not present

## 2015-06-09 DIAGNOSIS — I482 Chronic atrial fibrillation, unspecified: Secondary | ICD-10-CM

## 2015-06-09 DIAGNOSIS — Z950 Presence of cardiac pacemaker: Secondary | ICD-10-CM | POA: Diagnosis not present

## 2015-06-09 DIAGNOSIS — Z951 Presence of aortocoronary bypass graft: Secondary | ICD-10-CM | POA: Diagnosis not present

## 2015-06-09 DIAGNOSIS — M549 Dorsalgia, unspecified: Secondary | ICD-10-CM | POA: Diagnosis not present

## 2015-06-09 DIAGNOSIS — E1140 Type 2 diabetes mellitus with diabetic neuropathy, unspecified: Secondary | ICD-10-CM | POA: Diagnosis not present

## 2015-06-09 DIAGNOSIS — I5032 Chronic diastolic (congestive) heart failure: Secondary | ICD-10-CM | POA: Diagnosis not present

## 2015-06-09 DIAGNOSIS — N400 Enlarged prostate without lower urinary tract symptoms: Secondary | ICD-10-CM | POA: Diagnosis not present

## 2015-06-09 DIAGNOSIS — G4730 Sleep apnea, unspecified: Secondary | ICD-10-CM | POA: Diagnosis not present

## 2015-06-09 DIAGNOSIS — I872 Venous insufficiency (chronic) (peripheral): Secondary | ICD-10-CM | POA: Diagnosis not present

## 2015-06-09 DIAGNOSIS — L03115 Cellulitis of right lower limb: Secondary | ICD-10-CM | POA: Diagnosis not present

## 2015-06-09 DIAGNOSIS — L97919 Non-pressure chronic ulcer of unspecified part of right lower leg with unspecified severity: Secondary | ICD-10-CM | POA: Diagnosis not present

## 2015-06-09 DIAGNOSIS — E785 Hyperlipidemia, unspecified: Secondary | ICD-10-CM | POA: Diagnosis not present

## 2015-06-09 DIAGNOSIS — G8929 Other chronic pain: Secondary | ICD-10-CM | POA: Diagnosis not present

## 2015-06-09 LAB — POCT INR: INR: 2.2

## 2015-06-10 ENCOUNTER — Other Ambulatory Visit: Payer: Self-pay | Admitting: Internal Medicine

## 2015-06-10 ENCOUNTER — Telehealth: Payer: Self-pay

## 2015-06-10 DIAGNOSIS — L97919 Non-pressure chronic ulcer of unspecified part of right lower leg with unspecified severity: Secondary | ICD-10-CM | POA: Diagnosis not present

## 2015-06-10 DIAGNOSIS — I872 Venous insufficiency (chronic) (peripheral): Secondary | ICD-10-CM | POA: Diagnosis not present

## 2015-06-10 DIAGNOSIS — L03115 Cellulitis of right lower limb: Secondary | ICD-10-CM | POA: Diagnosis not present

## 2015-06-10 DIAGNOSIS — E1165 Type 2 diabetes mellitus with hyperglycemia: Secondary | ICD-10-CM | POA: Diagnosis not present

## 2015-06-10 NOTE — Telephone Encounter (Signed)
Home Health Cert/Plan of Care received (05/28/2015 - 07/26/2015) and placed on MD's desk for signature

## 2015-06-10 NOTE — Telephone Encounter (Signed)
Paperwork signed, faxed, copy sent to scan 

## 2015-06-11 ENCOUNTER — Telehealth: Payer: Self-pay

## 2015-06-11 NOTE — Telephone Encounter (Signed)
Patient Assistance Program forms application for Januvia was mailed (copy sent to scan) in Corrine's absence

## 2015-06-15 ENCOUNTER — Encounter: Payer: Self-pay | Admitting: *Deleted

## 2015-06-15 ENCOUNTER — Ambulatory Visit (INDEPENDENT_AMBULATORY_CARE_PROVIDER_SITE_OTHER): Payer: Medicare Other | Admitting: Internal Medicine

## 2015-06-15 ENCOUNTER — Other Ambulatory Visit: Payer: Self-pay | Admitting: *Deleted

## 2015-06-15 ENCOUNTER — Other Ambulatory Visit (INDEPENDENT_AMBULATORY_CARE_PROVIDER_SITE_OTHER): Payer: Medicare Other

## 2015-06-15 ENCOUNTER — Encounter: Payer: Self-pay | Admitting: Internal Medicine

## 2015-06-15 VITALS — BP 122/70 | HR 85 | Resp 20 | Wt 213.0 lb

## 2015-06-15 DIAGNOSIS — R6889 Other general symptoms and signs: Secondary | ICD-10-CM

## 2015-06-15 DIAGNOSIS — I10 Essential (primary) hypertension: Secondary | ICD-10-CM | POA: Diagnosis not present

## 2015-06-15 DIAGNOSIS — S81801D Unspecified open wound, right lower leg, subsequent encounter: Secondary | ICD-10-CM | POA: Diagnosis not present

## 2015-06-15 DIAGNOSIS — IMO0002 Reserved for concepts with insufficient information to code with codable children: Secondary | ICD-10-CM

## 2015-06-15 DIAGNOSIS — Z0001 Encounter for general adult medical examination with abnormal findings: Secondary | ICD-10-CM | POA: Diagnosis not present

## 2015-06-15 DIAGNOSIS — E1165 Type 2 diabetes mellitus with hyperglycemia: Secondary | ICD-10-CM | POA: Diagnosis not present

## 2015-06-15 DIAGNOSIS — Z1159 Encounter for screening for other viral diseases: Secondary | ICD-10-CM

## 2015-06-15 DIAGNOSIS — M4806 Spinal stenosis, lumbar region: Secondary | ICD-10-CM | POA: Diagnosis not present

## 2015-06-15 DIAGNOSIS — M79604 Pain in right leg: Secondary | ICD-10-CM

## 2015-06-15 DIAGNOSIS — E1140 Type 2 diabetes mellitus with diabetic neuropathy, unspecified: Secondary | ICD-10-CM

## 2015-06-15 DIAGNOSIS — M48062 Spinal stenosis, lumbar region with neurogenic claudication: Secondary | ICD-10-CM

## 2015-06-15 DIAGNOSIS — S81801A Unspecified open wound, right lower leg, initial encounter: Secondary | ICD-10-CM | POA: Insufficient documentation

## 2015-06-15 LAB — URINALYSIS, ROUTINE W REFLEX MICROSCOPIC
Bilirubin Urine: NEGATIVE
HGB URINE DIPSTICK: NEGATIVE
Ketones, ur: NEGATIVE
Leukocytes, UA: NEGATIVE
NITRITE: NEGATIVE
Total Protein, Urine: NEGATIVE
Urine Glucose: NEGATIVE
Urobilinogen, UA: 0.2 (ref 0.0–1.0)
pH: 6 (ref 5.0–8.0)

## 2015-06-15 LAB — HEPATIC FUNCTION PANEL
ALT: 32 U/L (ref 0–53)
AST: 32 U/L (ref 0–37)
Albumin: 4.3 g/dL (ref 3.5–5.2)
Alkaline Phosphatase: 87 U/L (ref 39–117)
BILIRUBIN DIRECT: 0.1 mg/dL (ref 0.0–0.3)
BILIRUBIN TOTAL: 0.4 mg/dL (ref 0.2–1.2)
Total Protein: 6.7 g/dL (ref 6.0–8.3)

## 2015-06-15 LAB — CBC WITH DIFFERENTIAL/PLATELET
BASOS PCT: 0.3 % (ref 0.0–3.0)
Basophils Absolute: 0 10*3/uL (ref 0.0–0.1)
EOS PCT: 2.6 % (ref 0.0–5.0)
Eosinophils Absolute: 0.2 10*3/uL (ref 0.0–0.7)
HEMATOCRIT: 33.6 % — AB (ref 39.0–52.0)
HEMOGLOBIN: 11 g/dL — AB (ref 13.0–17.0)
LYMPHS PCT: 29.8 % (ref 12.0–46.0)
Lymphs Abs: 1.9 10*3/uL (ref 0.7–4.0)
MCHC: 32.8 g/dL (ref 30.0–36.0)
MCV: 86.3 fl (ref 78.0–100.0)
Monocytes Absolute: 0.7 10*3/uL (ref 0.1–1.0)
Monocytes Relative: 11.4 % (ref 3.0–12.0)
NEUTROS ABS: 3.5 10*3/uL (ref 1.4–7.7)
Neutrophils Relative %: 55.9 % (ref 43.0–77.0)
PLATELETS: 162 10*3/uL (ref 150.0–400.0)
RBC: 3.89 Mil/uL — ABNORMAL LOW (ref 4.22–5.81)
RDW: 16.6 % — AB (ref 11.5–15.5)
WBC: 6.2 10*3/uL (ref 4.0–10.5)

## 2015-06-15 LAB — BASIC METABOLIC PANEL
BUN: 28 mg/dL — AB (ref 6–23)
CHLORIDE: 101 meq/L (ref 96–112)
CO2: 31 mEq/L (ref 19–32)
CREATININE: 1.37 mg/dL (ref 0.40–1.50)
Calcium: 9.4 mg/dL (ref 8.4–10.5)
GFR: 54.71 mL/min — AB (ref >60.00)
Glucose, Bld: 114 mg/dL — ABNORMAL HIGH (ref 70–99)
POTASSIUM: 4.7 meq/L (ref 3.5–5.1)
Sodium: 138 mEq/L (ref 135–145)

## 2015-06-15 LAB — LIPID PANEL
CHOL/HDL RATIO: 3
Cholesterol: 98 mg/dL (ref 0–200)
HDL: 35.3 mg/dL — ABNORMAL LOW (ref >39.00)
LDL CALC: 55 mg/dL (ref 0–99)
NONHDL: 62.89
TRIGLYCERIDES: 38 mg/dL (ref 0.0–149.0)
VLDL: 7.6 mg/dL (ref 0.0–40.0)

## 2015-06-15 LAB — MICROALBUMIN / CREATININE URINE RATIO
Creatinine,U: 28.1 mg/dL
MICROALB/CREAT RATIO: 2.5 mg/g (ref 0.0–30.0)
Microalb, Ur: <0.7 mg/dL (ref 0.0–1.9)

## 2015-06-15 LAB — PSA: PSA: 3.06 ng/mL (ref 0.10–4.00)

## 2015-06-15 LAB — TSH: TSH: 0.79 u[IU]/mL (ref 0.35–4.50)

## 2015-06-15 LAB — HEMOGLOBIN A1C: Hgb A1c MFr Bld: 7.1 % — ABNORMAL HIGH (ref 4.6–6.5)

## 2015-06-15 MED ORDER — TRAMADOL HCL 50 MG PO TABS: 50.0000 mg | ORAL_TABLET | Freq: Three times a day (TID) | ORAL | Status: DC | PRN

## 2015-06-15 NOTE — Assessment & Plan Note (Addendum)
Has f/u with [facility] soon, to cont gabapentin, gave tramadol prn today, but may need pain clinic referral; also check RLE arterial dopplers to r/o vascular claudication  In addition to the time spent performing CPE, I spent an additional 40 minutes face to face,in which greater than 50% of this time was spent in counseling and coordination of care for patient's acute illness as documented.

## 2015-06-15 NOTE — Assessment & Plan Note (Signed)
stable overall by history and exam, recent data reviewed with pt, and pt to continue medical treatment as before,  to f/u any worsening symptoms or concerns BP Readings from Last 3 Encounters:  06/15/15 122/70  06/02/15 128/72  05/27/15 133/83

## 2015-06-15 NOTE — Progress Notes (Signed)
This encounter was created in error - please disregard.

## 2015-06-15 NOTE — Assessment & Plan Note (Signed)

## 2015-06-15 NOTE — Assessment & Plan Note (Signed)
Non healing and non infected but persistent weeping and swelling, for wound clinic referral

## 2015-06-15 NOTE — Progress Notes (Signed)
Subjective:    Patient ID: [name], male    DOB: January 12, 1946, 70 y.o.   MRN: [id number]  HPI  Here for wellness and f/u;  Overall doing ok;  Pt denies Chest pain, worsening SOB, DOE, wheezing, orthopnea, PND, worsening LE edema, palpitations, dizziness or syncope.  Pt denies neurological change such as new headache, facial or extremity weakness.  Pt denies polydipsia, polyuria, or low sugar symptoms. Pt states overall good compliance with treatment and medications, good tolerability, and has been trying to follow appropriate diet.  Pt denies worsening depressive symptoms, suicidal ideation or panic. No fever, night sweats, wt loss, loss of appetite, or other constitutional symptoms.  Pt states good ability with ADL's, has low fall risk, home safety reviewed and adequate, no other significant changes in hearing or vision, and only occasionally active with exercise. No recent falls.Walks with cane out of the house for safetly.  Pt continues to have recurring LBP without change in severity, bowel or bladder change, fever, wt loss,  worsening LE numbness/weakness, gait change or falls, but conts to meniton possibly now worsening RLE pain, not sure if assoc with neurogenic claudication b/c has possiblly worsening RLE swelling to above the knee assoc with a non healing right medial heel area ulcer, cont;s to weep.  Followed by home health with wraps but not helping enough.  Pain getting worse, Seemed some better with antibiotic recently but no recent fever.   Past Medical History  Diagnosis Date  . 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 HYPERLIPIDEMIA 08/29/2006    takes Atorvastatin daily  . GOUT 04/22/2007    takes Allopurinol daily  . DEPRESSION 08/29/2006  . Atrioventricular block, complete ([place]) 09/04/2008  . DIASTOLIC HEART FAILURE, CHRONIC 06/09/2008  . BENIGN PROSTATIC HYPERTROPHY 08/29/2006    takes Flomax daily  . LUMBAR RADICULOPATHY, RIGHT 06/10/2007  . INSOMNIA-SLEEP DISORDER-UNSPEC  10/23/2007  . PACEMAKER, PERMANENT 03/04/2008    pt denies this date  . Left lumbar radiculopathy 05/30/2010  . Sleep apnea     "if I lay flat I quit breathing; HOB up I'm fine"  . Arthritis     "lower back; going back down both my sciatic nerves"  . Aortic root dilatation ([place]) 02/02/2011  . HYPERTENSION 08/29/2006    takes Lisinopril daily  . Myocardial infarction ([place]) 12/27/10    "I've had several MIs"  . Muscle spasm     takes Zanaflex daily  . CHF (congestive heart failure) (HCC)     takes Lasix daily  . Shortness of breath dyspnea     "all my life" with exertion  . History of migraine     63yrs ago  . DIABETES MELLITUS, TYPE II 08/29/2006    takes Metformin,Januvia,and Glipizide  daily  . CORONARY ARTERY DISEASE 08/29/2006    takes Coumadin daily  . 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 Peripheral neuropathy (HCC)     takes Gabapentin daily  . Chronic back pain     HNP   . Cataracts, bilateral     immature  . Presence of permanent cardiac pacemaker    Past Surgical History  Procedure Laterality Date  . S/p left arm surgury after work accident  1991    "2000# steel fell on it"  . S/p right hand surgury for foreign object  1970's    "piece of wood went in my hand; had to get that out"  . Insert / replace / remove pacemaker  ~ 2004    initial  pacemaker placement  . Insert / replace / remove pacemaker  10/2009    generator change  . Colonoscopy    . Esophagogastroduodenoscopy N/A 12/01/2013    Procedure: ESOPHAGOGASTRODUODENOSCOPY (EGD);  Surgeon: Lafayette Dragon, MD;  Location: [facility];  Service: Endoscopy;  Laterality: N/A;  . Coronary artery bypass graft  1992    CABG X 2  . Coronary angioplasty with stent placement  12/27/10    "I've had a total of 9 cardiac stents put in"  . Lumbar laminectomy/decompression microdiscectomy Right 03/23/2014    Procedure: LAMINECTOMY AND FORAMINOTOMY RIGHT LUMBAR THREE-FOUR,LUMBAR FOUR-FIVE, LUMBAR FIVE-SACRAL ONE;  Surgeon: Charlie Pitter, MD;  Location: [place] [facility];  Service: Neurosurgery;  Laterality: Right;  right  . Lumbar laminectomy/decompression microdiscectomy Left 12/01/2014    Procedure: Left Lumbar Three-Four, Lumbar Four-Five Laminectomy and Foraminotomy;  Surgeon: Earnie Larsson, MD;  Location: [place] [facility];  Service: Neurosurgery;  Laterality: Left;    reports that he quit smoking about 42 years ago. His smoking use included Cigarettes. He has a 27 pack-year smoking history. He has quit using smokeless tobacco. He reports that he does not drink alcohol or use illicit drugs. family history includes Coronary artery disease (age of onset: 23) in his other; Diabetes in his mother, other, and sister; Heart disease in his sister and sister; Lung cancer in his sister. Allergies  Allergen Reactions  . Crestor [Rosuvastatin Calcium] Other (See Comments)    Urination of blood   Current Outpatient Prescriptions on File Prior to Visit  Medication Sig Dispense Refill  . allopurinol (ZYLOPRIM) 300 MG tablet take 1 tablet by mouth once daily (Patient taking differently: Take 300 mg by mouth daily. ) 90 tablet 3  . atorvastatin (LIPITOR) 80 MG tablet Take 1 tablet by mouth  daily 90 tablet 1  . carvedilol (COREG) 25 MG tablet Take 1 tablet by mouth two  times daily 180 tablet 1  . Cholecalciferol (VITAMIN D) 1000 UNITS capsule Take 1,000 Units by mouth daily. Reported on 02/15/2015    . fluticasone (FLONASE) 50 MCG/ACT nasal spray Use 2 sprays in each  nostril every day 48 g 1  . furosemide (LASIX) 80 MG tablet Take 1 tablet (80 mg total) by mouth 2 (two) times daily. 60 tablet 5  . gabapentin (NEURONTIN) 300 MG capsule Take 2 capsules by mouth 3  times daily 540 capsule 1  . glipiZIDE (GLUCOTROL XL) 10 MG 24 hr tablet Take 1 tablet by mouth  daily with breakfast 90 tablet 1  . glucose blood (ONE TOUCH ULTRA TEST) test strip Use to check blood sugars twice a day Dx E11.9 200 each 2  . ibuprofen (ADVIL,MOTRIN) 200 MG tablet Take  800 mg by mouth daily as needed for mild pain or moderate pain.     Marland Kitchen lisinopril (PRINIVIL,ZESTRIL) 2.5 MG tablet Take 1 tablet (2.5 mg total) by mouth daily. 90 tablet 3  . metFORMIN (GLUCOPHAGE) 500 MG tablet Take 2 tablets by mouth two times daily 360 tablet 1  . Multiple Vitamin (MULTIVITAMIN WITH MINERALS) TABS tablet Take 1 tablet by mouth every morning.    . potassium chloride SA (K-DUR,KLOR-CON) 20 MEQ tablet Take 1 tablet by mouth  every evening 90 tablet 1  . sitaGLIPtin (JANUVIA) 100 MG tablet Take 1 tablet (100 mg total) by mouth daily. (Patient taking differently: Take 100 mg by mouth every evening. ) 90 tablet 3  . tamsulosin (FLOMAX) 0.4 MG CAPS capsule Take 1 capsule by mouth  every 12 hours 180 capsule 1  . tiZANidine (ZANAFLEX) 4 MG tablet TAKE 1 TABLET BY MOUTH  EVERY 6 HOURS AS NEEDED FOR MUSCLE SPASMS. 180 tablet 1  . triamcinolone cream (KENALOG) 0.1 % Apply 1 application topically 2 (two) times daily. 30 g 0  . warfarin (COUMADIN) 5 MG tablet Take 1 tablet by mouth daily or as directed by coumadin clinic (Patient taking differently: Take 5 mg by mouth as directed. Take 1 tablet by mouth daily on Monday ,Wednesday, Friday. 2.5 mg Tuesday, Thursday, Saturday, Sunday.) 90 tablet 1   No current facility-administered medications on file prior to visit.    Review of Systems Constitutional: Negative for increased diaphoresis, or other activity, appetite or siginficant weight change other than noted HENT: Negative for worsening hearing loss, ear pain, facial swelling, mouth sores and neck stiffness.   Eyes: Negative for other worsening pain, redness or visual disturbance.  Respiratory: Negative for choking or stridor Cardiovascular: Negative for other chest pain and palpitations.  Gastrointestinal: Negative for worsening diarrhea, blood in stool, or abdominal distention Genitourinary: Negative for hematuria, flank pain or change in urine volume.  Musculoskeletal: Negative for  myalgias or other joint complaints.  Skin: Negative for other color change and wound or drainage.  Neurological: Negative for syncope and numbness. other than noted Hematological: Negative for adenopathy. or other swelling Psychiatric/Behavioral: Negative for hallucinations, SI, self-injury, decreased concentration or other worsening agitation.      Objective:   Physical Exam BP 122/70 mmHg  Pulse 85  Resp 20  Wt 213 lb (96.616 kg)  SpO2 94% VS noted,  Constitutional: Pt is oriented to person, place, and time. Appears well-developed and well-nourished, in no significant distress Head: Normocephalic and atraumatic  Eyes: Conjunctivae and EOM are normal. Pupils are equal, round, and reactive to light Right Ear: External ear normal.  Left Ear: External ear normal Nose: Nose normal.  Mouth/Throat: Oropharynx is clear and moist  Neck: Normal range of motion. Neck supple. No JVD present. No tracheal deviation present or significant neck LA or mass Cardiovascular: Normal rate, regular rhythm, normal heart sounds and intact distal pulses.   Pulmonary/Chest: Effort normal and breath sounds without rales or wheezing  Abdominal: Soft. Bowel sounds are normal. NT. No HSM  Musculoskeletal: Normal range of motion. Exhibits no edema Lymphadenopathy: Has no cervical adenopathy.  Neurological: Pt is alert and oriented to person, place, and time. Pt has normal reflexes. No cranial nerve deficit. Motor grossly intact Skin: Skin is warm and dry. No rash noted ,, but has 2+ edema to above right knee despite leg wrap that was not undone today, is changed weekly per [facility] RN Psychiatric:  Has normal mood and affect. Behavior is normal.    CLINICAL DATA: Diabetic with redness and swelling in the mid lower leg extending into the foot.  EXAM: RIGHT FOOT - 2 VIEW  COMPARISON: Radiographs 06/24/2007.  FINDINGS: The mineralization and alignment are normal. There is no evidence of acute fracture or  dislocation. There are stable mild degenerative changes within the midfoot and at the first metatarsal phalangeal joint. There is moderate dorsal soft tissue swelling. Diffuse vascular calcifications are noted. No evidence of radiopaque foreign body.  IMPRESSION: Dorsal soft tissue swelling. No acute osseous findings or evidence of foreign body.   Electronically Signed  By: Richardean Sale M.D.  On: 05/24/2015 14:03 CLINICAL DATA: Redness And Swelling Mid Lower Leg To Top Of Foot diabetic  EXAM: RIGHT TIBIA AND FIBULA - 2 VIEW  COMPARISON:  None.  FINDINGS: Numerous surgical clips in the medial soft tissues. No fracture or dislocation. No periosteal reaction. Small vessel calcification noted anteriorly.  IMPRESSION: No acute findings   Electronically Signed  By: Skipper Cliche M.D.  On: 05/24/2015 14:03    Assessment & Plan:

## 2015-06-15 NOTE — Patient Outreach (Signed)
[facility]) Care Management  06/15/2015  [name] 1945/08/18 [id number]  Call received today from [name] and his wife [name]. [name] is being treated for lower extremity cellulitis. He has been taking oral antibiotics and has a home health nurse who is applying una boots on a prescribed schedule.   Today, [name] is complaining of "extreme pain" in his leg and says that his wound is "healing at the top but not at the bottom". [name] relates that [name] has been going back to bed during the day because he is progressively feeling worse and worse and has less and less energy. She also reports that his diabetes "is probably out of control" because of this illness and is waning appetite and energy level.   Plan: I advised Mrs. Fishman to call Dr. Jeneen Rinks John's office to report these worsened symptoms. I will receive the [facility]'s call as a self referral and re-open this case. I will follow up with [name] by phone and will plan a visit later this week.    [place] [facility]  [phone]

## 2015-06-15 NOTE — Progress Notes (Signed)
Pre visit review using our clinic review tool, if applicable. No additional management support is needed unless otherwise documented below in the visit note. 

## 2015-06-15 NOTE — Patient Instructions (Addendum)
Please take all new medication as prescribed  - the tramadol for pain  I would try to avoid using the ibuprofen with the coumadin  Please continue all other medications as before, and refills have been done if requested.  Please have the pharmacy call with any other refills you may need.  Please continue your efforts at being more active, low cholesterol diet, and weight control.  You are otherwise up to date with prevention measures today.  Please keep your appointments with your specialists as you may have planned  You will be contacted regarding the referral for: Wound clinic, and Leg artery testing for circulation check  Please go to the LAB in the Basement (turn left off the elevator) for the tests to be done today  You will be contacted by phone if any changes need to be made immediately.  Otherwise, you will receive a letter about your results with an explanation, but please check with MyChart first.  [name] remember to sign up for MyChart if you have not done so, as this will be important to you in the future with finding out test results, communicating by private email, and scheduling acute appointments online when needed.

## 2015-06-15 NOTE — Assessment & Plan Note (Signed)
stable overall by history and exam, recent data reviewed with pt, and pt to continue medical treatment as before,  to f/u any worsening symptoms or concerns Lab Results  Component Value Date   HGBA1C 8.1* 02/24/2014

## 2015-06-16 DIAGNOSIS — Z951 Presence of aortocoronary bypass graft: Secondary | ICD-10-CM | POA: Diagnosis not present

## 2015-06-16 DIAGNOSIS — Z950 Presence of cardiac pacemaker: Secondary | ICD-10-CM | POA: Diagnosis not present

## 2015-06-16 DIAGNOSIS — N400 Enlarged prostate without lower urinary tract symptoms: Secondary | ICD-10-CM | POA: Diagnosis not present

## 2015-06-16 DIAGNOSIS — I252 Old myocardial infarction: Secondary | ICD-10-CM | POA: Diagnosis not present

## 2015-06-16 DIAGNOSIS — Z7901 Long term (current) use of anticoagulants: Secondary | ICD-10-CM | POA: Diagnosis not present

## 2015-06-16 DIAGNOSIS — L03115 Cellulitis of right lower limb: Secondary | ICD-10-CM | POA: Diagnosis not present

## 2015-06-16 DIAGNOSIS — M549 Dorsalgia, unspecified: Secondary | ICD-10-CM | POA: Diagnosis not present

## 2015-06-16 DIAGNOSIS — I5032 Chronic diastolic (congestive) heart failure: Secondary | ICD-10-CM | POA: Diagnosis not present

## 2015-06-16 DIAGNOSIS — E1165 Type 2 diabetes mellitus with hyperglycemia: Secondary | ICD-10-CM | POA: Diagnosis not present

## 2015-06-16 DIAGNOSIS — E1140 Type 2 diabetes mellitus with diabetic neuropathy, unspecified: Secondary | ICD-10-CM | POA: Diagnosis not present

## 2015-06-16 DIAGNOSIS — L97919 Non-pressure chronic ulcer of unspecified part of right lower leg with unspecified severity: Secondary | ICD-10-CM | POA: Diagnosis not present

## 2015-06-16 DIAGNOSIS — I872 Venous insufficiency (chronic) (peripheral): Secondary | ICD-10-CM | POA: Diagnosis not present

## 2015-06-16 DIAGNOSIS — Z7984 Long term (current) use of oral hypoglycemic drugs: Secondary | ICD-10-CM | POA: Diagnosis not present

## 2015-06-16 DIAGNOSIS — I110 Hypertensive heart disease with heart failure: Secondary | ICD-10-CM | POA: Diagnosis not present

## 2015-06-16 DIAGNOSIS — M109 Gout, unspecified: Secondary | ICD-10-CM | POA: Diagnosis not present

## 2015-06-16 DIAGNOSIS — I2510 Atherosclerotic heart disease of native coronary artery without angina pectoris: Secondary | ICD-10-CM | POA: Diagnosis not present

## 2015-06-16 DIAGNOSIS — K219 Gastro-esophageal reflux disease without esophagitis: Secondary | ICD-10-CM | POA: Diagnosis not present

## 2015-06-16 DIAGNOSIS — G8929 Other chronic pain: Secondary | ICD-10-CM | POA: Diagnosis not present

## 2015-06-16 DIAGNOSIS — I482 Chronic atrial fibrillation: Secondary | ICD-10-CM | POA: Diagnosis not present

## 2015-06-16 DIAGNOSIS — E785 Hyperlipidemia, unspecified: Secondary | ICD-10-CM | POA: Diagnosis not present

## 2015-06-16 DIAGNOSIS — G4730 Sleep apnea, unspecified: Secondary | ICD-10-CM | POA: Diagnosis not present

## 2015-06-16 LAB — HEPATITIS C ANTIBODY: HCV Ab: NEGATIVE

## 2015-06-18 NOTE — Telephone Encounter (Signed)
error 

## 2015-06-22 ENCOUNTER — Ambulatory Visit: Payer: Medicare Other | Admitting: Urology

## 2015-06-23 ENCOUNTER — Ambulatory Visit (INDEPENDENT_AMBULATORY_CARE_PROVIDER_SITE_OTHER): Payer: Medicare Other | Admitting: Pharmacist

## 2015-06-23 ENCOUNTER — Other Ambulatory Visit: Payer: Self-pay | Admitting: *Deleted

## 2015-06-23 DIAGNOSIS — M549 Dorsalgia, unspecified: Secondary | ICD-10-CM | POA: Diagnosis not present

## 2015-06-23 DIAGNOSIS — N400 Enlarged prostate without lower urinary tract symptoms: Secondary | ICD-10-CM | POA: Diagnosis not present

## 2015-06-23 DIAGNOSIS — Z950 Presence of cardiac pacemaker: Secondary | ICD-10-CM | POA: Diagnosis not present

## 2015-06-23 DIAGNOSIS — E785 Hyperlipidemia, unspecified: Secondary | ICD-10-CM | POA: Diagnosis not present

## 2015-06-23 DIAGNOSIS — Z7901 Long term (current) use of anticoagulants: Secondary | ICD-10-CM | POA: Diagnosis not present

## 2015-06-23 DIAGNOSIS — G4730 Sleep apnea, unspecified: Secondary | ICD-10-CM | POA: Diagnosis not present

## 2015-06-23 DIAGNOSIS — Z951 Presence of aortocoronary bypass graft: Secondary | ICD-10-CM | POA: Diagnosis not present

## 2015-06-23 DIAGNOSIS — I482 Chronic atrial fibrillation, unspecified: Secondary | ICD-10-CM

## 2015-06-23 DIAGNOSIS — K219 Gastro-esophageal reflux disease without esophagitis: Secondary | ICD-10-CM | POA: Diagnosis not present

## 2015-06-23 DIAGNOSIS — I252 Old myocardial infarction: Secondary | ICD-10-CM | POA: Diagnosis not present

## 2015-06-23 DIAGNOSIS — I110 Hypertensive heart disease with heart failure: Secondary | ICD-10-CM | POA: Diagnosis not present

## 2015-06-23 DIAGNOSIS — L97919 Non-pressure chronic ulcer of unspecified part of right lower leg with unspecified severity: Secondary | ICD-10-CM | POA: Diagnosis not present

## 2015-06-23 DIAGNOSIS — M109 Gout, unspecified: Secondary | ICD-10-CM | POA: Diagnosis not present

## 2015-06-23 DIAGNOSIS — E1140 Type 2 diabetes mellitus with diabetic neuropathy, unspecified: Secondary | ICD-10-CM | POA: Diagnosis not present

## 2015-06-23 DIAGNOSIS — L03115 Cellulitis of right lower limb: Secondary | ICD-10-CM | POA: Diagnosis not present

## 2015-06-23 DIAGNOSIS — I872 Venous insufficiency (chronic) (peripheral): Secondary | ICD-10-CM | POA: Diagnosis not present

## 2015-06-23 DIAGNOSIS — Z7984 Long term (current) use of oral hypoglycemic drugs: Secondary | ICD-10-CM | POA: Diagnosis not present

## 2015-06-23 DIAGNOSIS — I5032 Chronic diastolic (congestive) heart failure: Secondary | ICD-10-CM | POA: Diagnosis not present

## 2015-06-23 DIAGNOSIS — G8929 Other chronic pain: Secondary | ICD-10-CM | POA: Diagnosis not present

## 2015-06-23 DIAGNOSIS — I2510 Atherosclerotic heart disease of native coronary artery without angina pectoris: Secondary | ICD-10-CM | POA: Diagnosis not present

## 2015-06-23 DIAGNOSIS — E1165 Type 2 diabetes mellitus with hyperglycemia: Secondary | ICD-10-CM | POA: Diagnosis not present

## 2015-06-23 LAB — POCT INR: INR: 1.8

## 2015-06-23 NOTE — Patient Outreach (Signed)
[facility]) Care Management  06/23/2015  DANNIE BARRESI 03/28/1945 [id number]   Call received from Charna Busman RN who is the home health nurse from [place] who has been following Mr. Whittum at home for follow up of cellulitis and weekly application of [employer]. Today was week 4 of [place] and Ms. Ardis Hughs removed the dressing and report that Mr. Bynes skin is intact and healed. She left the [employer] off and has discharged him from home care services.   Plan: I will see Mr. Magann at home next week for scheduled visit.    [place] Management  [phone]

## 2015-06-25 ENCOUNTER — Other Ambulatory Visit: Payer: Self-pay | Admitting: Internal Medicine

## 2015-06-30 ENCOUNTER — Other Ambulatory Visit: Payer: Self-pay | Admitting: *Deleted

## 2015-06-30 NOTE — Patient Outreach (Signed)
[facility]) Care Management   06/30/2015  [name] Mar 10, 1945 [id number]  [name] is an 70 y.o. male who is followed by [facility] for DM and HTN Disease Management and Care management services..   [name] had back surgery on 12/02/14 and has experienced drastic improvement in mobility and pain management. He has struggled to get back on track with diabetes management but cbg averages (see below) have improved as a result of his work in adhering to his prescribed carb modified diet.   Recently [name] was hospitalized with lower extremity cellulitis and was followed at home for several weeks by [facility]. His HHRN applied UNA Boot x 4 weeks and he finished a course of oral antibiotics. His UNA Boot has been off since 06/23/15.   Subjective: "I guess I'm getting back on track. My back and hip hurts some but its better than it was. My leg is better but my skin still looks funny."  Objective:  BP 100/64 mmHg  Pulse 63  Wt 208 lb (94.348 kg)  SpO2 96%  Review of Systems  Constitutional: Positive for malaise/fatigue. Negative for fever, chills and weight loss.  HENT: Negative.   Eyes: Negative.   Respiratory: Negative.   Cardiovascular: Negative for chest pain, palpitations, orthopnea and leg swelling.  Gastrointestinal: Negative.   Genitourinary: Negative.   Musculoskeletal: Positive for myalgias. Negative for falls.  Skin:       Right lower extremity mid calf to ankle discolored/darker in appearance than rest of his skin; recently healed cellulitis of this area; no areas of open skin, wound, weeping; skin is warm/dry  Neurological: Negative for dizziness, tingling, tremors, sensory change, focal weakness and weakness.  Endo/Heme/Allergies: Negative.   Psychiatric/Behavioral: Negative.     Physical Exam  Constitutional: He is oriented to person, place, and time. Vital signs are normal. He appears well-developed and  well-nourished. He is active. He does not have a sickly appearance. He does not appear ill.  Cardiovascular: Normal rate, regular rhythm, S1 normal and S2 normal.  Exam reveals no gallop and no friction rub.   No murmur heard. Respiratory: Effort normal and breath sounds normal. He has no wheezes. He has no rhonchi. He has no rales.  GI: Soft. Bowel sounds are normal. There is no tenderness.  Neurological: He is alert and oriented to person, place, and time.  Skin: Skin is warm and dry.  Psychiatric: He has a normal mood and affect. His speech is normal and behavior is normal. Judgment and thought content normal. Cognition and memory are normal.    Encounter Medications:   Outpatient Encounter Prescriptions as of 06/30/2015  Medication Sig Note  . allopurinol (ZYLOPRIM) 300 MG tablet take 1 tablet by mouth once daily (Patient taking differently: Take 300 mg by mouth daily. )   . atorvastatin (LIPITOR) 80 MG tablet Take 1 tablet by mouth  daily   . Blood Glucose Monitoring Suppl (ONE TOUCH ULTRA 2) w/Device KIT Use as directed   . carvedilol (COREG) 25 MG tablet Take 1 tablet by mouth two  times daily   . Cholecalciferol (VITAMIN D) 1000 UNITS capsule Take 1,000 Units by mouth daily. Reported on 02/15/2015 02/15/2015: Taking multivitamin   . fluticasone (FLONASE) 50 MCG/ACT nasal spray Use 2 sprays in each  nostril every day   . furosemide (LASIX) 80 MG tablet Take 1 tablet (80 mg total) by mouth 2 (two) times daily. 05/24/2015: .  . gabapentin (NEURONTIN) 300  MG capsule Take 2 capsules by mouth 3  times daily   . glipiZIDE (GLUCOTROL XL) 10 MG 24 hr tablet Take 1 tablet by mouth  daily with breakfast   . glucose blood (ONE TOUCH ULTRA TEST) test strip Use to check blood sugars twice a day Dx E11.9   . ibuprofen (ADVIL,MOTRIN) 200 MG tablet Take 800 mg by mouth daily as needed for mild pain or moderate pain.  12/03/2014: On hand; will not take while using prescribed narcotic analgesic  .  lisinopril (PRINIVIL,ZESTRIL) 2.5 MG tablet Take 1 tablet (2.5 mg total) by mouth daily.   . metFORMIN (GLUCOPHAGE) 500 MG tablet Take 2 tablets by mouth two times daily   . Multiple Vitamin (MULTIVITAMIN WITH MINERALS) TABS tablet Take 1 tablet by mouth every morning.   . potassium chloride SA (K-DUR,KLOR-CON) 20 MEQ tablet Take 1 tablet by mouth  every evening   . sitaGLIPtin (JANUVIA) 100 MG tablet Take 1 tablet (100 mg total) by mouth daily. (Patient taking differently: Take 100 mg by mouth every evening. )   . tamsulosin (FLOMAX) 0.4 MG CAPS capsule Take 1 capsule by mouth  every 12 hours   . tiZANidine (ZANAFLEX) 4 MG tablet TAKE 1 TABLET BY MOUTH  EVERY 6 HOURS AS NEEDED FOR MUSCLE SPASMS.   Marland Kitchen traMADol (ULTRAM) 50 MG tablet Take 1 tablet (50 mg total) by mouth every 8 (eight) hours as needed.   . triamcinolone cream (KENALOG) 0.1 % Apply 1 application topically 2 (two) times daily.   Marland Kitchen warfarin (COUMADIN) 5 MG tablet Take 1 tablet by mouth daily or as directed by coumadin clinic (Patient taking differently: Take 5 mg by mouth as directed. Take 1 tablet by mouth daily on Monday ,Wednesday, Friday. 2.5 mg Tuesday, Thursday, Saturday, Sunday.)    Assessment:  [name] is a 70 year old gentleman living in [place] with his wife who is wheelchair bound. He had back surgery on 12/03/14 and has had significant improvement in gait, pain management, and mobility. He recently was hospitalized with right lower extremity cellulitis and continued treatment as an outpatient with oral antibiotics and [employer]. He has DM and CHF.   Acute Health Condition (cellulitis right lower extremity) - improved after completion of course or oral antibiotics and treatment at home with [facility]; skin is discolored but warm and dry and without lesions, redness, warmth, drainage, or swelling  Chronic Health Condition (DM) - [name] adherence to DM plan of care has improved over the last month; he is managing his  diet more closely and beginning an exercise routine again.   Today's CBG: 177 7 day average = 130 (n=18) 14 day average = 152 (n=36) 30 day average = 144 (n=75)  Chronic Health Condition (CHF) - Mr. Fulford has not had CHF exacerbation symptoms; he has no edema but mild soft tissue swelling in the right foot/ankle as per his baseline; Mr. Millett is taking Lasix 32m BID as prescribed; he does not consistently adhere to a sodium restricted diet; weight is stable at 208#   Plan:   Mr. PGonyeawill report any new symptoms related to skin condition.   Mr. PRickeywill check cbg's and weigh daily and record, notifying provider for findings outside established parameters.   I will call Mr. PSavain 2 weeks to follow up on skin condition.   I will see Mr. PHayashiat home later in the month for routine home visit.    [facility]  [phone]

## 2015-07-01 ENCOUNTER — Other Ambulatory Visit: Payer: Self-pay | Admitting: *Deleted

## 2015-07-01 DIAGNOSIS — M5416 Radiculopathy, lumbar region: Secondary | ICD-10-CM | POA: Diagnosis not present

## 2015-07-01 NOTE — Patient Outreach (Signed)
[facility]) Care Management  07/01/2015  [name] 21-Dec-1945 [id number]  Call received from [name] wife, [name]. She says [name] asked her to call me because [name] has "a new sore place that came up and it looks just like when he was getting cellulitis." [name] recently was discharged from the hospital after a stay for cellulitis of the right lower extremity. After discharge, he was sent home on oral antibiotics (course completed) and Una boot x 4 weeks (taken off last week).   Today, [name] noticed an uncomfortable feeling on the inside "near the ankle" of his right leg/foot and noticed that he has an area of discolored, broken skin. He was seeing the neurosurgeon and showed it to him. It was recommended that [name] get in touch with his primary care provider so that "a cream" could be prescribed.   [name] was previously prescribed triamcinolone cream (Kenalog) 0.1% but no longer has any on hand.   I reached out to [name] CMA, [name], to notify her of these new symptoms, requesting that she in turn forward the information to [name].   Plan: I advised [name] to contact [name] office if he does not hear from them by tomorrow afternoon.    [facility]  [phone]

## 2015-07-03 ENCOUNTER — Ambulatory Visit (INDEPENDENT_AMBULATORY_CARE_PROVIDER_SITE_OTHER): Payer: Medicare Other | Admitting: Family Medicine

## 2015-07-03 ENCOUNTER — Encounter: Payer: Self-pay | Admitting: Family Medicine

## 2015-07-03 VITALS — BP 112/70 | HR 78 | Temp 98.0°F | Resp 16 | Wt 203.0 lb

## 2015-07-03 DIAGNOSIS — S81801D Unspecified open wound, right lower leg, subsequent encounter: Secondary | ICD-10-CM

## 2015-07-03 NOTE — Progress Notes (Signed)
Pre visit review using our clinic review tool, if applicable. No additional management support is needed unless otherwise documented below in the visit note.  R lower leg complaint.  He'll have clear fluid weeping from the area, will discolor the sock.  No bleeding.  Locally irritated.  Had been in [place] boot prev through home health.  Not in a boot for the last 2 weeks.  He has a compression stocking but he can't get it on now.  No FCNAVD.  Sugar controlled per patient report.    Meds, vitals, and allergies reviewed.   ROS: Per HPI unless specifically indicated in ROS section   nad ncat rrr ctab abd soft R ankle with trace edema.  Chronic skin irritation on the R medial ankle.  No ulceration.  Normal DP pulse.  No spreading acute erythema.

## 2015-07-03 NOTE — Assessment & Plan Note (Signed)
Looks to be slowly healing.  Would likely benefit from compression.  I don't have an unna boot to apply.  I put a nonstick bandage on with a coban.  It wasn't too tight.  Felt better per patient report.  I would like PCP to consider sending order to home health for repeat unna boot on Monday.  He would likely benefit from that for 1-2 more weeks.  Then patient would need recheck in clinic.  The bandage today is a temporary measure, d/w pt.  Elevate foot in the meantime.  Okay for outpatient f/u.  Doesn't appear infected.

## 2015-07-03 NOTE — Patient Instructions (Signed)
I'll ask your primary clinic to check on getting you an unna boot on Monday.  Leave the dressing on in the meantime.  Elevate your leg as much as possible.  Take care.  Glad to see you.

## 2015-07-06 ENCOUNTER — Telehealth: Payer: Self-pay

## 2015-07-06 NOTE — Telephone Encounter (Signed)
We do not have any una boots in our office---patient orig had wound care appt for 07/08/15 where he can get una boot---patient has cancelled appt for wound care---there's no way for me to supply una boot to patient from our office, he will need to go to wound care appt to get boot---i have tried calling patient back x3, line has been constantly busy for over one hour---i will keep trying to call ----patient was orig supplied una boot from hospital discharge referral to [facility] several months prior---patient also really needs to be seen by wound care in order to dress wound under una boot for better control of sores which will allow for proper healing of sores and to avoid another hospital stay for cellulitis---if patient calls back before i reach him, can talk with tamara

## 2015-07-07 ENCOUNTER — Ambulatory Visit (INDEPENDENT_AMBULATORY_CARE_PROVIDER_SITE_OTHER): Payer: Medicare Other | Admitting: Pharmacist Clinician (PhC)/ Clinical Pharmacy Specialist

## 2015-07-07 ENCOUNTER — Telehealth: Payer: Self-pay

## 2015-07-07 DIAGNOSIS — I482 Chronic atrial fibrillation, unspecified: Secondary | ICD-10-CM

## 2015-07-07 DIAGNOSIS — S81801A Unspecified open wound, right lower leg, initial encounter: Secondary | ICD-10-CM

## 2015-07-07 DIAGNOSIS — I483 Typical atrial flutter: Secondary | ICD-10-CM

## 2015-07-07 DIAGNOSIS — Z7901 Long term (current) use of anticoagulants: Secondary | ICD-10-CM

## 2015-07-07 LAB — POCT INR: INR: 1.8

## 2015-07-07 NOTE — Telephone Encounter (Signed)
Clarise Cruz nurse with [facility] left v/m requesting HH orders for unna boot; Dr Damita Dunnings saw pt on Sat Clinic but advised PCP to consider sending orders for [facility] to have unna boot repeated on Mon. I was unable to reach Clarise Cruz but spoke with Vaughan Basta at [facility] office and she will get message to Clarise Cruz to contact Dr Jeneen Rinks John's office. FYI to Dr Jenny Reichmann and Dr Damita Dunnings.

## 2015-07-07 NOTE — Telephone Encounter (Signed)
Noted, thanks.  Routed to PCP.

## 2015-07-08 NOTE — Telephone Encounter (Signed)
[facility] for verbal if this is ok; I am currently out of the office, to return on June 13

## 2015-07-09 NOTE — Telephone Encounter (Signed)
Notified Clarise Cruz w/MD response. She states the [facility] order need to be place as well. Place order for [facility] for unna boot...Johny Chess

## 2015-07-13 ENCOUNTER — Encounter: Payer: Self-pay | Admitting: Internal Medicine

## 2015-07-13 ENCOUNTER — Ambulatory Visit (INDEPENDENT_AMBULATORY_CARE_PROVIDER_SITE_OTHER): Payer: Medicare Other | Admitting: Internal Medicine

## 2015-07-13 ENCOUNTER — Telehealth: Payer: Self-pay

## 2015-07-13 VITALS — BP 116/60 | HR 94 | Temp 98.4°F | Resp 20 | Wt 210.0 lb

## 2015-07-13 DIAGNOSIS — I10 Essential (primary) hypertension: Secondary | ICD-10-CM

## 2015-07-13 DIAGNOSIS — M7989 Other specified soft tissue disorders: Secondary | ICD-10-CM

## 2015-07-13 DIAGNOSIS — M7062 Trochanteric bursitis, left hip: Secondary | ICD-10-CM | POA: Insufficient documentation

## 2015-07-13 DIAGNOSIS — M79605 Pain in left leg: Secondary | ICD-10-CM

## 2015-07-13 MED ORDER — TRAMADOL HCL 50 MG PO TABS: 50.0000 mg | ORAL_TABLET | Freq: Three times a day (TID) | ORAL | Status: DC | PRN

## 2015-07-13 MED ORDER — PREDNISONE 10 MG PO TABS
ORAL_TABLET | ORAL | Status: DC
Start: 2015-07-13 — End: 2015-09-08

## 2015-07-13 NOTE — Telephone Encounter (Signed)
i called sara/hh nurse at [phone] to ask about this patient---sara used to take care of this patient and is currently nurse for patient's wife---patient is being followed by lisa/hh nurse---sara is calling their office to see if lisa can touch base with patient to advise that [facility] does not have una boots---patient needs to see wound clinic (had appt there originally that patient cancelled)---wound clinic has una boot and can care for his wound properly underneath una boot---if unaboot is placed on top of wound without wound care, it will not help wound and allows for cellulitis----sara will have lisa try to reach patient to let him know that if his appt at [facility] today is for unaboot, we don't have boot here---he needs to call and reschedule his appt with wound ctr in order to get boot---corrine/dr john's asst advised

## 2015-07-13 NOTE — Progress Notes (Signed)
Subjective:    Patient ID: [name], male    DOB: [birth year], 70 y.o.   MRN: [id number]  HPI  Here to f/u, fell 1 wk ago today with catching wife, on concrete, now with left hip pain, mod sometimes severe, worse to walk, sometimes cant walk especially worse in the am, seems to loosen up a bit with keeping trying to move in the am OOB, pain electircal toothache and twisting knife like pain, radiates to the left foot occas. Has walker in the car today in case he needs it.  INR 1.8 about time of fall.  Pt with no bowel or bladder change, fever, wt loss.  Pt denies chest pain, increased sob or doe, wheezing, orthopnea, PND, palpitations, dizziness or syncope, but does have persistent RLE swelling, needs Unna boot per [facility] (nurse sara), to be placed soon.. Pt denies polydipsia, polyuria Past Medical History  Diagnosis Date  . 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 HYPERLIPIDEMIA 08/29/2006    takes Atorvastatin daily  . GOUT 04/22/2007    takes Allopurinol daily  . DEPRESSION 08/29/2006  . 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 DIASTOLIC HEART FAILURE, CHRONIC 06/09/2008  . BENIGN PROSTATIC HYPERTROPHY 08/29/2006    takes Flomax daily  . LUMBAR RADICULOPATHY, RIGHT 06/10/2007  . INSOMNIA-SLEEP DISORDER-UNSPEC 10/23/2007  . PACEMAKER, PERMANENT 03/04/2008    pt denies this date  . Left lumbar radiculopathy 05/30/2010  . Sleep apnea     "if I lay flat I quit breathing; HOB up I'm fine"  . Arthritis     "lower back; going back down both my sciatic nerves"  . Aortic root dilatation ([place]) 02/02/2011  . HYPERTENSION 08/29/2006    takes Lisinopril daily  . Myocardial infarction ([place]) 12/27/10    "I've had several MIs"  . Muscle spasm     takes Zanaflex daily  . CHF (congestive heart failure) (HCC)     takes Lasix daily  . Shortness of breath dyspnea     "all my life" with exertion  . History of migraine     43yr ago  . DIABETES MELLITUS, TYPE II 08/29/2006    takes  Metformin,Januvia,and Glipizide  daily  . CORONARY ARTERY DISEASE 08/29/2006    takes Coumadin daily  . 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 Peripheral neuropathy (HCC)     takes Gabapentin daily  . Chronic back pain     HNP   . Cataracts, bilateral     immature  . Presence of permanent cardiac pacemaker    Past Surgical History  Procedure Laterality Date  . S/p left arm surgury after work accident  1991    "2000# steel fell on it"  . S/p right hand surgury for foreign object  1970's    "piece of wood went in my hand; had to get that out"  . Insert / replace / remove pacemaker  ~ 2004    initial pacemaker placement  . Insert / replace / remove pacemaker  10/2009    generator change  . Colonoscopy    . Esophagogastroduodenoscopy N/A 12/01/2013    Procedure: ESOPHAGOGASTRODUODENOSCOPY (EGD);  Surgeon: DLafayette Dragon MD;  [facility];  Service: Endoscopy;  Laterality: N/A;  . Coronary artery bypass graft  1992    CABG X 2  . Coronary angioplasty with stent placement  12/27/10    "I've had a total of 9 cardiac stents put in"  . Lumbar laminectomy/decompression microdiscectomy Right 03/23/2014    Procedure: LAMINECTOMY  AND FORAMINOTOMY RIGHT LUMBAR THREE-FOUR,LUMBAR FOUR-FIVE, LUMBAR FIVE-SACRAL ONE;  Surgeon: Charlie Pitter, MD;  Location: [facility];  Service: Neurosurgery;  Laterality: Right;  right  . Lumbar laminectomy/decompression microdiscectomy Left 12/01/2014    Procedure: Left Lumbar Three-Four, Lumbar Four-Five Laminectomy and Foraminotomy;  Surgeon: Earnie Larsson, MD;  Location: [facility];  Service: Neurosurgery;  Laterality: Left;    reports that he quit smoking about 42 years ago. His smoking use included Cigarettes. He has a 27 pack-year smoking history. He has quit using smokeless tobacco. He reports that he does not drink alcohol or use illicit drugs. family history includes Coronary artery disease (age of onset: 55) in his other; Diabetes in his mother,  other, and sister; Heart disease in his sister and sister; Lung cancer in his sister. Allergies  Allergen Reactions  . Crestor [Rosuvastatin Calcium] Other (See Comments)    Urination of blood   Current Outpatient Prescriptions on File Prior to Visit  Medication Sig Dispense Refill  . allopurinol (ZYLOPRIM) 300 MG tablet take 1 tablet by mouth once daily (Patient taking differently: Take 300 mg by mouth daily. ) 90 tablet 3  . atorvastatin (LIPITOR) 80 MG tablet Take 1 tablet by mouth  daily 90 tablet 2  . Blood Glucose Monitoring Suppl (ONE TOUCH ULTRA 2) w/Device KIT Use as directed 1 each 0  . carvedilol (COREG) 25 MG tablet Take 1 tablet by mouth two  times daily 180 tablet 2  . Cholecalciferol (VITAMIN D) 1000 UNITS capsule Take 1,000 Units by mouth daily. Reported on 02/15/2015    . fluticasone (FLONASE) 50 MCG/ACT nasal spray Use 2 sprays in each  nostril every day 48 g 2  . furosemide (LASIX) 80 MG tablet Take 1 tablet (80 mg total) by mouth 2 (two) times daily. 60 tablet 5  . gabapentin (NEURONTIN) 300 MG capsule Take 2 capsules by mouth 3  times daily 540 capsule 2  . glipiZIDE (GLUCOTROL XL) 10 MG 24 hr tablet Take 1 tablet by mouth  daily with breakfast 90 tablet 2  . glucose blood (ONE TOUCH ULTRA TEST) test strip Use to check blood sugars twice a day Dx E11.9 200 each 2  . ibuprofen (ADVIL,MOTRIN) 200 MG tablet Take 800 mg by mouth daily as needed for mild pain or moderate pain.     Marland Kitchen lisinopril (PRINIVIL,ZESTRIL) 2.5 MG tablet Take 1 tablet (2.5 mg total) by mouth daily. 90 tablet 3  . metFORMIN (GLUCOPHAGE) 500 MG tablet Take 2 tablets by mouth two times daily 360 tablet 2  . Multiple Vitamin (MULTIVITAMIN WITH MINERALS) TABS tablet Take 1 tablet by mouth every morning.    . potassium chloride SA (K-DUR,KLOR-CON) 20 MEQ tablet Take 1 tablet by mouth  every evening 90 tablet 2  . sitaGLIPtin (JANUVIA) 100 MG tablet Take 1 tablet (100 mg total) by mouth daily. (Patient taking  differently: Take 100 mg by mouth every evening. ) 90 tablet 3  . tamsulosin (FLOMAX) 0.4 MG CAPS capsule Take 1 capsule by mouth  every 12 hours 180 capsule 2  . tiZANidine (ZANAFLEX) 4 MG tablet TAKE 1 TABLET BY MOUTH  EVERY 6 HOURS AS NEEDED FOR MUSCLE SPASMS. 180 tablet 2  . triamcinolone cream (KENALOG) 0.1 % Apply 1 application topically 2 (two) times daily. 30 g 0  . warfarin (COUMADIN) 5 MG tablet Take 1 tablet by mouth daily or as directed by coumadin clinic (Patient taking differently: Take 5 mg by mouth as directed. Take 1  tablet by mouth daily on Monday ,Wednesday, Friday. 2.5 mg Tuesday, Thursday, Saturday, Sunday.) 90 tablet 1   No current facility-administered medications on file prior to visit.   Review of Systems  Constitutional: Negative for unusual diaphoresis or night sweats HENT: Negative for ear swelling or discharge Eyes: Negative for worsening visual haziness  Respiratory: Negative for choking and stridor.   Gastrointestinal: Negative for distension or worsening eructation Genitourinary: Negative for retention or change in urine volume.  Musculoskeletal: Negative for other MSK pain or swelling Skin: Negative for color change and worsening wound Neurological: Negative for tremors and numbness other than noted  Psychiatric/Behavioral: Negative for decreased concentration or agitation other than above       Objective:   Physical Exam BP 116/60 mmHg  Pulse 94  Temp(Src) 98.4 F (36.9 C) (Oral)  Resp 20  Wt 210 lb (95.255 kg)  SpO2 94% VS noted,  Constitutional: Pt appears in no apparent distress HENT: Head: NCAT.  Right Ear: External ear normal.  Left Ear: External ear normal.  Eyes: . Pupils are equal, round, and reactive to light. Conjunctivae and EOM are normal Neck: Normal range of motion. Neck supple.  Cardiovascular: Normal rate and regular rhythm.   Pulmonary/Chest: Effort normal and breath sounds without rales or wheezing.  Abd:  Soft, NT, ND, +  BS Left lateral bursa mild tender without swelling or bruise Neurological: Pt is alert. Not confused , motor grossly intact Skin: Skin is warm. No rash, trace to 1+ RLE edema Psychiatric: Pt behavior is normal. No agitation.   CT LUMBAR MYELOGRAM IMPRESSION: Mar 2017 - summary  Improved appearance at L4-5 status post laminotomy and foraminotomy. Improved LEFT L5 and LEFT L4 nerve root impingement.  Residual/ recurrent protrusion L3-4 on the LEFT. LEFT L3 and/or LEFT L4 nerve root impingement are possible.  Stable chronic findings at L5-S1 disc space narrowing, calcified extruded free fragment, and LEFT-sided foraminal narrowing.  Anatomic alignment post surgery, with no dynamic instability.   Electronically Signed  By: Staci Righter M.D.  On: 03/31/2015 13:12    Assessment & Plan:

## 2015-07-13 NOTE — Progress Notes (Signed)
Pre visit review using our clinic review tool, if applicable. No additional management support is needed unless otherwise documented below in the visit note. 

## 2015-07-13 NOTE — Patient Instructions (Signed)
Please take all new medication as prescribed - the prednisone and the pain medication  Please continue all other medications as before, and refills have been done if requested.  Please have the pharmacy call with any other refills you may need.  Please continue your efforts at being more active, low cholesterol diet, and weight control.  You are otherwise up to date with prevention measures today.  Please keep your appointments with your specialists as you may have planned  You will be contacted regarding the referral for: [facility] for the right leg unna boot  Please return in 3-4 days if not improved

## 2015-07-14 DIAGNOSIS — D649 Anemia, unspecified: Secondary | ICD-10-CM | POA: Diagnosis not present

## 2015-07-14 DIAGNOSIS — I4891 Unspecified atrial fibrillation: Secondary | ICD-10-CM | POA: Diagnosis not present

## 2015-07-14 DIAGNOSIS — N400 Enlarged prostate without lower urinary tract symptoms: Secondary | ICD-10-CM | POA: Diagnosis not present

## 2015-07-14 DIAGNOSIS — L97219 Non-pressure chronic ulcer of right calf with unspecified severity: Secondary | ICD-10-CM | POA: Diagnosis not present

## 2015-07-14 DIAGNOSIS — I872 Venous insufficiency (chronic) (peripheral): Secondary | ICD-10-CM | POA: Diagnosis not present

## 2015-07-14 DIAGNOSIS — I110 Hypertensive heart disease with heart failure: Secondary | ICD-10-CM | POA: Diagnosis not present

## 2015-07-14 DIAGNOSIS — M4806 Spinal stenosis, lumbar region: Secondary | ICD-10-CM | POA: Diagnosis not present

## 2015-07-14 DIAGNOSIS — Z794 Long term (current) use of insulin: Secondary | ICD-10-CM | POA: Diagnosis not present

## 2015-07-14 DIAGNOSIS — I25810 Atherosclerosis of coronary artery bypass graft(s) without angina pectoris: Secondary | ICD-10-CM | POA: Diagnosis not present

## 2015-07-14 DIAGNOSIS — I5032 Chronic diastolic (congestive) heart failure: Secondary | ICD-10-CM | POA: Diagnosis not present

## 2015-07-14 DIAGNOSIS — E785 Hyperlipidemia, unspecified: Secondary | ICD-10-CM | POA: Diagnosis not present

## 2015-07-14 DIAGNOSIS — Z5181 Encounter for therapeutic drug level monitoring: Secondary | ICD-10-CM | POA: Diagnosis not present

## 2015-07-14 DIAGNOSIS — Z7901 Long term (current) use of anticoagulants: Secondary | ICD-10-CM | POA: Diagnosis not present

## 2015-07-14 DIAGNOSIS — E1140 Type 2 diabetes mellitus with diabetic neuropathy, unspecified: Secondary | ICD-10-CM | POA: Diagnosis not present

## 2015-07-14 DIAGNOSIS — Z4800 Encounter for change or removal of nonsurgical wound dressing: Secondary | ICD-10-CM | POA: Diagnosis not present

## 2015-07-16 ENCOUNTER — Other Ambulatory Visit: Payer: Self-pay | Admitting: *Deleted

## 2015-07-16 NOTE — Patient Outreach (Signed)
[facility]) Care Management  07/16/2015  [name] [birth year] [id number]  I spoke with [name] today to follow up on his lower extremity cellulitis. He reports that the home health nurse is coming to see him again and his doctor ordered placement of Una boot.   In addition, [name] fell last week when he was assisting his wife out of the car during heavy rain. He tried to help her out of the car and they both fell. He followed up with his primary care provider and per [name] report, he is going to need to see the orthopedist because of concern about a "chip" on his post surgical hip.   Plan: I am scheduled to see [name] at home on Tuesday for a routine home visit and will follow up with him about these concerns at that time.    [facility]  [phone]

## 2015-07-19 NOTE — Assessment & Plan Note (Signed)
stable overall by history and exam, recent data reviewed with pt, and pt to continue medical treatment as before,  to f/u any worsening symptoms or concerns BP Readings from Last 3 Encounters:  07/13/15 116/60  07/03/15 112/70  06/30/15 100/64

## 2015-07-19 NOTE — Assessment & Plan Note (Signed)
For [employer] per [facility],  to f/u any worsening symptoms or concerns

## 2015-07-19 NOTE — Assessment & Plan Note (Signed)
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

## 2015-07-19 NOTE — Assessment & Plan Note (Signed)
?   Radicular pain, also for predpac asd, to f/u any worsening symptoms or concerns

## 2015-07-20 ENCOUNTER — Other Ambulatory Visit: Payer: Self-pay | Admitting: *Deleted

## 2015-07-20 ENCOUNTER — Encounter: Payer: Self-pay | Admitting: *Deleted

## 2015-07-20 NOTE — Patient Outreach (Signed)
[facility]) Care Management   07/20/2015  DAMYN WEITZEL [birth year] [id number]  RYON LAYTON is an 70 y.o. male who is followed by [facility] for DM and HTN Disease Management and Care management services..   Mr. Brentlinger had back surgery on 12/02/14 and has experienced drastic improvement in mobility and pain management. He has struggled to get back on track with diabetes management but cbg averages (see below) have improved as a result of his work in adhering to his prescribed carb modified diet.   Recently Mr. Bentler was hospitalized with lower extremity cellulitis and was followed at home for several weeks by [place]. His HHRN applied UNA Boot x 4 weeks and he finished a course of oral antibiotics. His UNA Boot was removed on 06/23/15 and skin appeared healed and intact at that time.    On 07/01/15 I received a call from Mrs. Dunwoody sharing her concern about "a new place" on Mr. Benavides ankle that was concerning to both of them. I reached out to the primary care provider office, Mr. Savoca was seen in the office on 6/3, and [facility] services resumed on 0/3/50 with new application of Una boot.   I am seeing Mr. Altier today for follow up of lower extremity cellulitis and diabetes disease management.   Subjective: "I think I"m doing better on my leg but my sugar is way up because of the steroids."  Objective:  BP 104/60 mmHg  Pulse 69  Wt 215 lb (97.523 kg)  SpO2 96%  Review of Systems  Constitutional: Negative.   HENT: Negative.   Eyes: Negative.   Respiratory: Negative.   Cardiovascular: Negative for chest pain, palpitations, orthopnea and leg swelling.  Gastrointestinal: Negative.   Genitourinary: Negative.   Musculoskeletal: Positive for myalgias, joint pain and falls.       Fall on 6/11 sustaining injury to left hip; walks with cane; has limp; scheduled to see ortho in am  Skin:       Ardelia Mems boot to right leg from below knee to pre-tibial  foot  Neurological: Negative.   Psychiatric/Behavioral: Negative.     Physical Exam  Constitutional: He is oriented to person, place, and time. Vital signs are normal. He appears well-developed and well-nourished. He is active. He does not have a sickly appearance. He does not appear ill.  Cardiovascular: Normal rate and regular rhythm.   Respiratory: Effort normal and breath sounds normal. He has no wheezes. He has no rhonchi. He has no rales.  GI: Soft. Bowel sounds are normal.  Neurological: He is alert and oriented to person, place, and time.  Skin: Skin is warm and dry.     Dressing intact  Psychiatric: He has a normal mood and affect. His speech is normal and behavior is normal. Judgment and thought content normal. Cognition and memory are normal.    Encounter Medications:   Outpatient Encounter Prescriptions as of 07/20/2015  Medication Sig Note  . allopurinol (ZYLOPRIM) 300 MG tablet take 1 tablet by mouth once daily (Patient taking differently: Take 300 mg by mouth daily. )   . atorvastatin (LIPITOR) 80 MG tablet Take 1 tablet by mouth  daily   . Blood Glucose Monitoring Suppl (ONE TOUCH ULTRA 2) w/Device KIT Use as directed   . carvedilol (COREG) 25 MG tablet Take 1 tablet by mouth two  times daily   . Cholecalciferol (VITAMIN D) 1000 UNITS capsule Take 1,000 Units by mouth daily. Reported on 02/15/2015 02/15/2015:  Taking multivitamin   . fluticasone (FLONASE) 50 MCG/ACT nasal spray Use 2 sprays in each  nostril every day   . furosemide (LASIX) 80 MG tablet Take 1 tablet (80 mg total) by mouth 2 (two) times daily. 05/24/2015: .  . gabapentin (NEURONTIN) 300 MG capsule Take 2 capsules by mouth 3  times daily   . glipiZIDE (GLUCOTROL XL) 10 MG 24 hr tablet Take 1 tablet by mouth  daily with breakfast   . glucose blood (ONE TOUCH ULTRA TEST) test strip Use to check blood sugars twice a day Dx E11.9   . ibuprofen (ADVIL,MOTRIN) 200 MG tablet Take 800 mg by mouth daily as needed for  mild pain or moderate pain.  12/03/2014: On hand; will not take while using prescribed narcotic analgesic  . lisinopril (PRINIVIL,ZESTRIL) 2.5 MG tablet Take 1 tablet (2.5 mg total) by mouth daily.   . metFORMIN (GLUCOPHAGE) 500 MG tablet Take 2 tablets by mouth two times daily   . Multiple Vitamin (MULTIVITAMIN WITH MINERALS) TABS tablet Take 1 tablet by mouth every morning.   . potassium chloride SA (K-DUR,KLOR-CON) 20 MEQ tablet Take 1 tablet by mouth  every evening   . predniSONE (DELTASONE) 10 MG tablet 2 tabs by mouth per day for 5 days   . sitaGLIPtin (JANUVIA) 100 MG tablet Take 1 tablet (100 mg total) by mouth daily. (Patient taking differently: Take 100 mg by mouth every evening. )   . tamsulosin (FLOMAX) 0.4 MG CAPS capsule Take 1 capsule by mouth  every 12 hours   . tiZANidine (ZANAFLEX) 4 MG tablet TAKE 1 TABLET BY MOUTH  EVERY 6 HOURS AS NEEDED FOR MUSCLE SPASMS.   Marland Kitchen traMADol (ULTRAM) 50 MG tablet Take 1 tablet (50 mg total) by mouth every 8 (eight) hours as needed.   . triamcinolone cream (KENALOG) 0.1 % Apply 1 application topically 2 (two) times daily.   Marland Kitchen warfarin (COUMADIN) 5 MG tablet Take 1 tablet by mouth daily or as directed by coumadin clinic (Patient taking differently: Take 5 mg by mouth as directed. Take 1 tablet by mouth daily on Monday ,Wednesday, Friday. 2.5 mg Tuesday, Thursday, Saturday, Sunday.)    No facility-administered encounter medications on file as of 07/20/2015.    Functional Status:   In your present state of health, do you have any difficulty performing the following activities: 06/15/2015 05/24/2015  Hearing? N N  Vision? N N  Difficulty concentrating or making decisions? N N  Walking or climbing stairs? Y Y  Dressing or bathing? N N  Doing errands, shopping? N N  Preparing Food and eating ? N -  Using the Toilet? N -  In the past six months, have you accidently leaked urine? N -  Do you have problems with loss of bowel control? N -  Managing your  Medications? N -  Managing your Finances? N -  Housekeeping or managing your Housekeeping? N -    Fall/Depression Screening:    PHQ 2/9 Scores 06/15/2015 03/22/2015 12/03/2014 10/15/2014 09/16/2014 08/19/2014 05/05/2014  PHQ - 2 Score 1 0 0 0 0 0 0    Assessment:  Mr. Self is a 70 year old gentleman living in [place] with his wife who is wheelchair bound. He had back surgery on 12/03/14 and has had significant improvement in gait, pain management, and mobility. He recently was hospitalized with right lower extremity cellulitis and continued treatment as an outpatient with oral antibiotics and [employer]. He has DM and CHF.   Acute  Health Condition (cellulitis right lower extremity) - was treated in April/May for right lower extremity cellulitis with oral antibiotics and Ardelia Mems boot ([place] applying once weekly x 4 weeks); recurrence of lower extremity cellulitis and patient has now completed second course or oral antibiotics and steroids; Una boot in place to RLE; to follow up with PCP  Chronic Health Condition (DM) - Mr. Duddy adherence to DM plan of care has improved over the last month; he is managing his diet more closely but having just  Completed another course of oral steroids has seen a dramatic increase in cbg's  Today's CBG: 541 7 day average = 298 (2 weeks ago >130 (n=18) 14 day average = 235 (2 weeks ago > 152 (n=36) 30 day average = 194 ( 2 weeks ago > 144 (n=75)  Mr. Asfaw wishes to see if his cbg's will decrease over the next couple of days as he is off the steroids. I will call him on Friday to follow up.  Chronic Health Condition (CHF) - Mr. Criger has not had CHF exacerbation symptoms; he has no edema but mild soft tissue swelling in the right foot/ankle as per his baseline; Mr. Cairns is taking Lasix 41m BID as prescribed; he does not consistently adhere to a sodium restricted diet; weight is  215# today - he attributes this also to taking oral steroids and  anticipates/hopes his weight will decrease over the next week  Plan:   Mr. PBrileswill report any new symptoms related to skin condition.   Mr. PGalanwill check cbg's and weigh daily and record, notifying provider for findings outside established parameters.  ] I will call Mr. PTruexon Friday to follow up on his cbg's and weight.   I will see Mr. PPelzerat home again in 2 weeks.   THN CM Care Plan Problem One        Most Recent Value   Care Plan Problem One  Acute Health Condition (Cellulitis RLE)   Role Documenting the Problem One  Care Management Coordinator   Care Plan for Problem One  Active   THN Long Term Goal (31-90 days)  Over the next 31 days., patient will verbalize understanding of plan of care for treatment of recurrent cellulitis   THN Long Term Goal Start Date  07/16/15   Interventions for Problem One Long Term Goal  Utilizing teachback method, reviewed with patient instructions as provided by his primary care provider and [facility] Una [facility]   THN CM Short Term Goal #1 (0-30 days)  Over the next 30 days, patient will report new or worsened symptoms related to recurrent cellulitis   THN CM Short Term Goal #1 Start Date  07/16/15   Interventions for Short Term Goal #1  Utilizing teachback method, reviewed signs and symptoms of worsening skin condition,  prescribed Emmi educational materials   THN CM Short Term Goal #2 (0-30 days)  Over the next 30 days, patient will see provider in follow up as scheduled   THN CM Short Term Goal #2 Start Date  07/16/15   Interventions for Short Term Goal #2  Confirmed plans/instructions provided by MD for follow up    [facility] Care Plan Problem Two        Most Recent Value   Care Plan Problem Two  Poor Management of Chronic Health condition (DMII)   Role Documenting the Problem Two  Care Management [facility]for Problem Two  Active   Interventions for Problem Two Long Term Goal   Utilizing teachback  method, discussed with patient/spouse importance of ongoing careful monitoring and self health management of DMII   THN Long Term Goal (31-90) days  Over the next 60 days patient will verbalize understanding of plan of care for self mangement of DMII   THN Long Term Goal Start Date  06/15/15    [facility] CM Care Plan Problem Three        Most Recent Value   Care Plan Problem Three  Fall Risk/Recent Fall   Role Documenting the Problem Three  Care Management Coordinator   Care Plan for Problem Three  Active   THN Long Term Goal (31-90) days  Over the next 31 days, patient will verbalize understanding of plan of care for injury sustained from fall    [place] Term Goal Start Date  07/16/15   Interventions for Problem Three Long Term Goal  Discussed with patient plans as outlined by his provider for follow up of injury sustained in recent fall   THN CM Short Term Goal #1 (0-30 days)  Over the next 30 days, patient will see provider in consultation for injury sustained in fall    THN CM Short Term Goal #1 Start Date  07/16/15   Interventions for Short Term Goal #1  Discussed referral plans with patient   THN CM Short Term Goal #2 (0-30 days)  Over the next 30 days, patient will exercise fall precautions as taught to him by his physical therapist   THN CM Short Term Goal #2 Start Date  07/16/15   Interventions for Short Term Goal #2  Reviewed previously taught fall prevention stsrategies    THN CM Short Term Goal #3 (0-30 days)  Over the next 30 days, patient will exercise caution in assisting spouse with physical/hands on needs   [facility]  CM Short Term Goal #3 Start Date  07/16/15   Interventions for Short Term Goal #3  Discussed with patient the importance of asking for assistance when helping wheelchair bound spouse with phsyical care       [facility]  [phone]

## 2015-07-21 ENCOUNTER — Ambulatory Visit (INDEPENDENT_AMBULATORY_CARE_PROVIDER_SITE_OTHER): Payer: Medicare Other | Admitting: Pharmacist Clinician (PhC)/ Clinical Pharmacy Specialist

## 2015-07-21 DIAGNOSIS — N400 Enlarged prostate without lower urinary tract symptoms: Secondary | ICD-10-CM | POA: Diagnosis not present

## 2015-07-21 DIAGNOSIS — I110 Hypertensive heart disease with heart failure: Secondary | ICD-10-CM | POA: Diagnosis not present

## 2015-07-21 DIAGNOSIS — I482 Chronic atrial fibrillation, unspecified: Secondary | ICD-10-CM

## 2015-07-21 DIAGNOSIS — M5416 Radiculopathy, lumbar region: Secondary | ICD-10-CM | POA: Diagnosis not present

## 2015-07-21 DIAGNOSIS — I4891 Unspecified atrial fibrillation: Secondary | ICD-10-CM | POA: Diagnosis not present

## 2015-07-21 DIAGNOSIS — Z794 Long term (current) use of insulin: Secondary | ICD-10-CM | POA: Diagnosis not present

## 2015-07-21 DIAGNOSIS — S79912A Unspecified injury of left hip, initial encounter: Secondary | ICD-10-CM | POA: Diagnosis not present

## 2015-07-21 DIAGNOSIS — Z4800 Encounter for change or removal of nonsurgical wound dressing: Secondary | ICD-10-CM | POA: Diagnosis not present

## 2015-07-21 DIAGNOSIS — Z7901 Long term (current) use of anticoagulants: Secondary | ICD-10-CM

## 2015-07-21 DIAGNOSIS — I5032 Chronic diastolic (congestive) heart failure: Secondary | ICD-10-CM | POA: Diagnosis not present

## 2015-07-21 DIAGNOSIS — L97219 Non-pressure chronic ulcer of right calf with unspecified severity: Secondary | ICD-10-CM | POA: Diagnosis not present

## 2015-07-21 DIAGNOSIS — I872 Venous insufficiency (chronic) (peripheral): Secondary | ICD-10-CM | POA: Diagnosis not present

## 2015-07-21 DIAGNOSIS — D649 Anemia, unspecified: Secondary | ICD-10-CM | POA: Diagnosis not present

## 2015-07-21 DIAGNOSIS — E1140 Type 2 diabetes mellitus with diabetic neuropathy, unspecified: Secondary | ICD-10-CM | POA: Diagnosis not present

## 2015-07-21 DIAGNOSIS — E785 Hyperlipidemia, unspecified: Secondary | ICD-10-CM | POA: Diagnosis not present

## 2015-07-21 DIAGNOSIS — I25810 Atherosclerosis of coronary artery bypass graft(s) without angina pectoris: Secondary | ICD-10-CM | POA: Diagnosis not present

## 2015-07-21 DIAGNOSIS — Z5181 Encounter for therapeutic drug level monitoring: Secondary | ICD-10-CM | POA: Diagnosis not present

## 2015-07-21 DIAGNOSIS — M4806 Spinal stenosis, lumbar region: Secondary | ICD-10-CM | POA: Diagnosis not present

## 2015-07-21 LAB — POCT INR: INR: 3.4

## 2015-07-23 ENCOUNTER — Other Ambulatory Visit: Payer: Self-pay | Admitting: *Deleted

## 2015-07-23 NOTE — Patient Outreach (Signed)
[facility]) Care Management  07/23/2015  DAWAUN PASE 19-Oct-1945 [id number]  I spoke with Mr. Kelsoe by phone this afternoon.   Cellulitis - He says he saw [name] in the office on 07/20/15 and his leg was examined. He feels it is improved. A new Una boot was applied and he is to return to Dr. Gwynn Burly office next week on Wednesday for follow up.   Fall/Left Hip Pain - Mr. Jian also saw Dr. Trenton Gammon in the office for follow up of left hip pain after his recent fall. Mr. Jupin reports he had xrays and was told he has "separation of the muscle from the bone and a hairline fracture". Per Mr. Laskoski, he will be treated conservatively with rest and pain medication until it heals.   Diabetes - Mr. Traverse cbg's had been quite elevated over the last few weeks possibly related to course of steroids. He finished his most recent dose pack early in the week and tells me today that his fasting cbg this morning was 156 and his post lunch cbg was 138.   Plan: I will see Mr. Sanches at home on 08/05/15 for follow up of problems as outlined above.    [facility]  [phone]

## 2015-07-26 ENCOUNTER — Ambulatory Visit: Payer: Medicare Other | Admitting: *Deleted

## 2015-07-28 ENCOUNTER — Encounter: Payer: Self-pay | Admitting: Internal Medicine

## 2015-07-28 ENCOUNTER — Telehealth: Payer: Self-pay | Admitting: Internal Medicine

## 2015-07-28 ENCOUNTER — Ambulatory Visit (INDEPENDENT_AMBULATORY_CARE_PROVIDER_SITE_OTHER): Payer: Medicare Other | Admitting: Pharmacist Clinician (PhC)/ Clinical Pharmacy Specialist

## 2015-07-28 ENCOUNTER — Ambulatory Visit (INDEPENDENT_AMBULATORY_CARE_PROVIDER_SITE_OTHER): Payer: Medicare Other | Admitting: Internal Medicine

## 2015-07-28 VITALS — BP 118/78 | HR 98 | Temp 97.7°F | Resp 20 | Wt 218.0 lb

## 2015-07-28 DIAGNOSIS — E1140 Type 2 diabetes mellitus with diabetic neuropathy, unspecified: Secondary | ICD-10-CM

## 2015-07-28 DIAGNOSIS — I4891 Unspecified atrial fibrillation: Secondary | ICD-10-CM | POA: Diagnosis not present

## 2015-07-28 DIAGNOSIS — I25810 Atherosclerosis of coronary artery bypass graft(s) without angina pectoris: Secondary | ICD-10-CM | POA: Diagnosis not present

## 2015-07-28 DIAGNOSIS — D649 Anemia, unspecified: Secondary | ICD-10-CM | POA: Diagnosis not present

## 2015-07-28 DIAGNOSIS — M7989 Other specified soft tissue disorders: Secondary | ICD-10-CM

## 2015-07-28 DIAGNOSIS — I5032 Chronic diastolic (congestive) heart failure: Secondary | ICD-10-CM | POA: Diagnosis not present

## 2015-07-28 DIAGNOSIS — Z794 Long term (current) use of insulin: Secondary | ICD-10-CM | POA: Diagnosis not present

## 2015-07-28 DIAGNOSIS — I872 Venous insufficiency (chronic) (peripheral): Secondary | ICD-10-CM | POA: Diagnosis not present

## 2015-07-28 DIAGNOSIS — I482 Chronic atrial fibrillation, unspecified: Secondary | ICD-10-CM

## 2015-07-28 DIAGNOSIS — Z5181 Encounter for therapeutic drug level monitoring: Secondary | ICD-10-CM | POA: Diagnosis not present

## 2015-07-28 DIAGNOSIS — Z7901 Long term (current) use of anticoagulants: Secondary | ICD-10-CM | POA: Diagnosis not present

## 2015-07-28 DIAGNOSIS — L97219 Non-pressure chronic ulcer of right calf with unspecified severity: Secondary | ICD-10-CM | POA: Diagnosis not present

## 2015-07-28 DIAGNOSIS — E1165 Type 2 diabetes mellitus with hyperglycemia: Secondary | ICD-10-CM | POA: Diagnosis not present

## 2015-07-28 DIAGNOSIS — N400 Enlarged prostate without lower urinary tract symptoms: Secondary | ICD-10-CM | POA: Diagnosis not present

## 2015-07-28 DIAGNOSIS — IMO0002 Reserved for concepts with insufficient information to code with codable children: Secondary | ICD-10-CM

## 2015-07-28 DIAGNOSIS — E785 Hyperlipidemia, unspecified: Secondary | ICD-10-CM | POA: Diagnosis not present

## 2015-07-28 DIAGNOSIS — M4806 Spinal stenosis, lumbar region: Secondary | ICD-10-CM | POA: Diagnosis not present

## 2015-07-28 DIAGNOSIS — I10 Essential (primary) hypertension: Secondary | ICD-10-CM | POA: Diagnosis not present

## 2015-07-28 DIAGNOSIS — Z4800 Encounter for change or removal of nonsurgical wound dressing: Secondary | ICD-10-CM | POA: Diagnosis not present

## 2015-07-28 DIAGNOSIS — I110 Hypertensive heart disease with heart failure: Secondary | ICD-10-CM | POA: Diagnosis not present

## 2015-07-28 LAB — POCT INR: INR: 1.8

## 2015-07-28 NOTE — Telephone Encounter (Signed)
Please advise 

## 2015-07-28 NOTE — Progress Notes (Signed)
Subjective:    Patient ID: [name], male    DOB: 02-Jul-1945, 70 y.o.   MRN: [id number]  HPI  Here to f/u; overall doing ok,  Pt denies chest pain, increasing sob or doe, wheezing, orthopnea, PND, increased LE swelling, palpitations, dizziness or syncope.  Pt denies new neurological symptoms such as new headache, or facial or extremity weakness or numbness.  Pt denies polydipsia, polyuria, or low sugar episode.   Pt denies new neurological symptoms such as new headache, or facial or extremity weakness or numbness.   Pt states overall good compliance with meds, mostly trying to follow appropriate diet, with wt overall stable,  but little exercise however. Right leg swelling has resolved, asks for [facility] boot off today.   Past Medical History  Diagnosis Date  . 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 HYPERLIPIDEMIA 08/29/2006    takes Atorvastatin daily  . GOUT 04/22/2007    takes Allopurinol daily  . DEPRESSION 08/29/2006  . 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 DIASTOLIC HEART FAILURE, CHRONIC 06/09/2008  . BENIGN PROSTATIC HYPERTROPHY 08/29/2006    takes Flomax daily  . LUMBAR RADICULOPATHY, RIGHT 06/10/2007  . INSOMNIA-SLEEP DISORDER-UNSPEC 10/23/2007  . PACEMAKER, PERMANENT 03/04/2008    pt denies this date  . Left lumbar radiculopathy 05/30/2010  . Sleep apnea     "if I lay flat I quit breathing; HOB up I'm fine"  . Arthritis     "lower back; going back down both my sciatic nerves"  . Aortic root dilatation ([place]) 02/02/2011  . HYPERTENSION 08/29/2006    takes Lisinopril daily  . Myocardial infarction ([place]) 12/27/10    "I've had several MIs"  . Muscle spasm     takes Zanaflex daily  . CHF (congestive heart failure) (HCC)     takes Lasix daily  . Shortness of breath dyspnea     "all my life" with exertion  . History of migraine     10yr ago  . DIABETES MELLITUS, TYPE II 08/29/2006    takes Metformin,Januvia,and Glipizide  daily  . CORONARY ARTERY DISEASE  08/29/2006    takes Coumadin daily  . 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 Peripheral neuropathy (HCC)     takes Gabapentin daily  . Chronic back pain     HNP   . Cataracts, bilateral     immature  . Presence of permanent cardiac pacemaker    Past Surgical History  Procedure Laterality Date  . S/p left arm surgury after work accident  1991    "2000# steel fell on it"  . S/p right hand surgury for foreign object  1970's    "piece of wood went in my hand; had to get that out"  . Insert / replace / remove pacemaker  ~ 2004    initial pacemaker placement  . Insert / replace / remove pacemaker  10/2009    generator change  . Colonoscopy    . Esophagogastroduodenoscopy N/A 12/01/2013    Procedure: ESOPHAGOGASTRODUODENOSCOPY (EGD);  Surgeon: DLafayette Dragon MD;  [facility];  Service: Endoscopy;  Laterality: N/A;  . Coronary artery bypass graft  1992    CABG X 2  . Coronary angioplasty with stent placement  12/27/10    "I've had a total of 9 cardiac stents put in"  . Lumbar laminectomy/decompression microdiscectomy Right 03/23/2014    Procedure: LAMINECTOMY AND FORAMINOTOMY RIGHT LUMBAR THREE-FOUR,LUMBAR FOUR-FIVE, LUMBAR FIVE-SACRAL ONE;  Surgeon: HCharlie Pitter MD;  Location: [facility];  Service: Neurosurgery;  Laterality: Right;  right  . Lumbar laminectomy/decompression microdiscectomy Left 12/01/2014    Procedure: Left Lumbar Three-Four, Lumbar Four-Five Laminectomy and Foraminotomy;  Surgeon: Earnie Larsson, MD;  Location: [facility];  Service: Neurosurgery;  Laterality: Left;    reports that he quit smoking about 42 years ago. His smoking use included Cigarettes. He has a 27 pack-year smoking history. He has quit using smokeless tobacco. He reports that he does not drink alcohol or use illicit drugs. family history includes Coronary artery disease (age of onset: 60) in his other; Diabetes in his mother, other, and sister; Heart disease in his sister and sister; Lung cancer  in his sister. Allergies  Allergen Reactions  . Crestor [Rosuvastatin Calcium] Other (See Comments)    Urination of blood   Current Outpatient Prescriptions on File Prior to Visit  Medication Sig Dispense Refill  . allopurinol (ZYLOPRIM) 300 MG tablet take 1 tablet by mouth once daily (Patient taking differently: Take 300 mg by mouth daily. ) 90 tablet 3  . atorvastatin (LIPITOR) 80 MG tablet Take 1 tablet by mouth  daily 90 tablet 2  . Blood Glucose Monitoring Suppl (ONE TOUCH ULTRA 2) w/Device KIT Use as directed 1 each 0  . carvedilol (COREG) 25 MG tablet Take 1 tablet by mouth two  times daily 180 tablet 2  . Cholecalciferol (VITAMIN D) 1000 UNITS capsule Take 1,000 Units by mouth daily. Reported on 02/15/2015    . fluticasone (FLONASE) 50 MCG/ACT nasal spray Use 2 sprays in each  nostril every day 48 g 2  . furosemide (LASIX) 80 MG tablet Take 1 tablet (80 mg total) by mouth 2 (two) times daily. 60 tablet 5  . gabapentin (NEURONTIN) 300 MG capsule Take 2 capsules by mouth 3  times daily 540 capsule 2  . glipiZIDE (GLUCOTROL XL) 10 MG 24 hr tablet Take 1 tablet by mouth  daily with breakfast 90 tablet 2  . glucose blood (ONE TOUCH ULTRA TEST) test strip Use to check blood sugars twice a day Dx E11.9 200 each 2  . ibuprofen (ADVIL,MOTRIN) 200 MG tablet Take 800 mg by mouth daily as needed for mild pain or moderate pain.     Marland Kitchen lisinopril (PRINIVIL,ZESTRIL) 2.5 MG tablet Take 1 tablet (2.5 mg total) by mouth daily. 90 tablet 3  . metFORMIN (GLUCOPHAGE) 500 MG tablet Take 2 tablets by mouth two times daily 360 tablet 2  . Multiple Vitamin (MULTIVITAMIN WITH MINERALS) TABS tablet Take 1 tablet by mouth every morning.    . potassium chloride SA (K-DUR,KLOR-CON) 20 MEQ tablet Take 1 tablet by mouth  every evening 90 tablet 2  . predniSONE (DELTASONE) 10 MG tablet 2 tabs by mouth per day for 5 days 10 tablet 0  . sitaGLIPtin (JANUVIA) 100 MG tablet Take 1 tablet (100 mg total) by mouth daily.  (Patient taking differently: Take 100 mg by mouth every evening. ) 90 tablet 3  . tamsulosin (FLOMAX) 0.4 MG CAPS capsule Take 1 capsule by mouth  every 12 hours 180 capsule 2  . tiZANidine (ZANAFLEX) 4 MG tablet TAKE 1 TABLET BY MOUTH  EVERY 6 HOURS AS NEEDED FOR MUSCLE SPASMS. 180 tablet 2  . traMADol (ULTRAM) 50 MG tablet Take 1 tablet (50 mg total) by mouth every 8 (eight) hours as needed. 90 tablet 1  . triamcinolone cream (KENALOG) 0.1 % Apply 1 application topically 2 (two) times daily. 30 g 0  . warfarin (COUMADIN) 5 MG tablet Take 1  tablet by mouth daily or as directed by coumadin clinic (Patient taking differently: Take 5 mg by mouth as directed. Take 1 tablet by mouth daily on Monday ,Wednesday, Friday. 2.5 mg Tuesday, Thursday, Saturday, Sunday.) 90 tablet 1   No current facility-administered medications on file prior to visit.   Review of Systems  Constitutional: Negative for unusual diaphoresis or night sweats HENT: Negative for ear swelling or discharge Eyes: Negative for worsening visual haziness  Respiratory: Negative for choking and stridor.   Gastrointestinal: Negative for distension or worsening eructation Genitourinary: Negative for retention or change in urine volume.  Musculoskeletal: Negative for other MSK pain or swelling Skin: Negative for color change and worsening wound Neurological: Negative for tremors and numbness other than noted  Psychiatric/Behavioral: Negative for decreased concentration or agitation other than above       Objective:   Physical Exam BP 118/78 mmHg  Pulse 98  Temp(Src) 97.7 F (36.5 C) (Oral)  Resp 20  Wt 218 lb (98.884 kg)  SpO2 95% VS noted,  Constitutional: Pt appears in no apparent distress HENT: Head: NCAT.  Right Ear: External ear normal.  Left Ear: External ear normal.  Eyes: . Pupils are equal, round, and reactive to light. Conjunctivae and EOM are normal Neck: Normal range of motion. Neck supple.  Cardiovascular: Normal  rate and regular rhythm.   Pulmonary/Chest: Effort normal and breath sounds without rales or wheezing.  Neurological: Pt is alert. Not confused , motor grossly intact Skin: Skin is warm. No rash, no LE edema - both LE without significant swelling after right unna boot removed, no ulcers or erythema Psychiatric: Pt behavior is normal. No agitation.      Assessment & Plan:

## 2015-07-28 NOTE — Progress Notes (Signed)
Pre visit review using our clinic review tool, if applicable. No additional management support is needed unless otherwise documented below in the visit note. 

## 2015-07-28 NOTE — Patient Instructions (Signed)
OK to NOT resume the [employer] at this time  Corinne to call Judson Roch at [facility] to confirm this  Please wear the knee high compression stocking during daytime hours  Please continue all other medications as before, and refills have been done if requested.  Please have the pharmacy call with any other refills you may need.  Please keep your appointments with your specialists as you may have planned

## 2015-07-28 NOTE — Telephone Encounter (Signed)
Today is patients last visit for nursing.  Patient has appointment today.  If nursing needs to continue, needs call back with orders.  If patient does not want to continue with unna boot, could compression socks work?  If does want to continue with unna boot needs order for another boot.

## 2015-07-29 DIAGNOSIS — Z7901 Long term (current) use of anticoagulants: Secondary | ICD-10-CM | POA: Diagnosis not present

## 2015-07-29 DIAGNOSIS — I872 Venous insufficiency (chronic) (peripheral): Secondary | ICD-10-CM | POA: Diagnosis not present

## 2015-07-31 NOTE — Assessment & Plan Note (Signed)
stable overall by history and exam, recent data reviewed with pt, and pt to continue medical treatment as before,  to f/u any worsening symptoms or concerns Lab Results  Component Value Date   HGBA1C 7.1* 06/15/2015

## 2015-07-31 NOTE — Assessment & Plan Note (Signed)
Resolved with unna boot, removed today, for compression stockings, leg elevations, low sodium diet

## 2015-08-04 ENCOUNTER — Ambulatory Visit (INDEPENDENT_AMBULATORY_CARE_PROVIDER_SITE_OTHER): Payer: Medicare Other | Admitting: Pharmacist Clinician (PhC)/ Clinical Pharmacy Specialist

## 2015-08-04 DIAGNOSIS — I4891 Unspecified atrial fibrillation: Secondary | ICD-10-CM | POA: Diagnosis not present

## 2015-08-04 DIAGNOSIS — N400 Enlarged prostate without lower urinary tract symptoms: Secondary | ICD-10-CM | POA: Diagnosis not present

## 2015-08-04 DIAGNOSIS — E1140 Type 2 diabetes mellitus with diabetic neuropathy, unspecified: Secondary | ICD-10-CM | POA: Diagnosis not present

## 2015-08-04 DIAGNOSIS — M4806 Spinal stenosis, lumbar region: Secondary | ICD-10-CM | POA: Diagnosis not present

## 2015-08-04 DIAGNOSIS — I25810 Atherosclerosis of coronary artery bypass graft(s) without angina pectoris: Secondary | ICD-10-CM | POA: Diagnosis not present

## 2015-08-04 DIAGNOSIS — I872 Venous insufficiency (chronic) (peripheral): Secondary | ICD-10-CM | POA: Diagnosis not present

## 2015-08-04 DIAGNOSIS — Z794 Long term (current) use of insulin: Secondary | ICD-10-CM | POA: Diagnosis not present

## 2015-08-04 DIAGNOSIS — I5032 Chronic diastolic (congestive) heart failure: Secondary | ICD-10-CM | POA: Diagnosis not present

## 2015-08-04 DIAGNOSIS — I482 Chronic atrial fibrillation, unspecified: Secondary | ICD-10-CM

## 2015-08-04 DIAGNOSIS — D649 Anemia, unspecified: Secondary | ICD-10-CM | POA: Diagnosis not present

## 2015-08-04 DIAGNOSIS — E785 Hyperlipidemia, unspecified: Secondary | ICD-10-CM | POA: Diagnosis not present

## 2015-08-04 DIAGNOSIS — I110 Hypertensive heart disease with heart failure: Secondary | ICD-10-CM | POA: Diagnosis not present

## 2015-08-04 DIAGNOSIS — Z4800 Encounter for change or removal of nonsurgical wound dressing: Secondary | ICD-10-CM | POA: Diagnosis not present

## 2015-08-04 DIAGNOSIS — L97219 Non-pressure chronic ulcer of right calf with unspecified severity: Secondary | ICD-10-CM | POA: Diagnosis not present

## 2015-08-04 DIAGNOSIS — Z5181 Encounter for therapeutic drug level monitoring: Secondary | ICD-10-CM | POA: Diagnosis not present

## 2015-08-04 DIAGNOSIS — Z7901 Long term (current) use of anticoagulants: Secondary | ICD-10-CM | POA: Diagnosis not present

## 2015-08-05 ENCOUNTER — Other Ambulatory Visit: Payer: Self-pay | Admitting: *Deleted

## 2015-08-05 NOTE — Patient Outreach (Signed)
[facility]) Care Management   08/05/2015  [name] 11/23/1945 [id number]  [name] is an 70 y.o. male who is followed by [facility] for DM and HTN Disease Management and Care management services..   [name] had back surgery on 12/02/14 and has experienced drastic improvement in mobility and pain management. He has struggled to get back on track with diabetes management but cbg averages (see below) have improved as a result of his work in adhering to his prescribed carb modified diet.   Recently [name] was hospitalized with lower extremity cellulitis and was followed at home for several weeks by [place]. His HHRN applied UNA Boot x 4 weeks and he finished a course of oral antibiotics. His UNA Boot was removed on 06/23/15 and skin appeared healed and intact at that time.   On 07/01/15 I received a call from [name] sharing her concern about "a new place" on [name] ankle that was concerning to both of them. I reached out to the primary care provider office, Mr. Bob was seen in the office on 6/3, and [facility] services resumed on 07/03/36 with new application of Una boot.   I am seeing [name] today for follow up of lower extremity cellulitis and diabetes disease management.   Subjective: "I think everything is better right now - my leg and my blood sugar. The only thing is this pain in my hip but I just have to wait on it to heal."  Objective: BP 126/68 mmHg  Pulse 63  Wt 207 lb (93.895 kg)  SpO2 96%  Review of Systems  Constitutional: Negative.   HENT: Negative.   Eyes: Negative.   Respiratory: Negative.   Cardiovascular: Negative.   Gastrointestinal: Negative.   Genitourinary: Negative.   Musculoskeletal: Positive for myalgias and joint pain. Negative for falls.  Skin:       Per patient "the nurse said my leg is looking really good. I only have to put on this compression sock."  Neurological: Negative.    Psychiatric/Behavioral: Negative.     Physical Exam  Constitutional: He is oriented to person, place, and time. Vital signs are normal. He appears well-developed and well-nourished. He is active. He does not have a sickly appearance. He does not appear ill.  Cardiovascular: Normal rate and regular rhythm.   Respiratory: Effort normal and breath sounds normal.  GI: Soft. Bowel sounds are normal.  Neurological: He is alert and oriented to person, place, and time.  Skin: Skin is warm and dry.  Psychiatric: He has a normal mood and affect. His behavior is normal. Judgment and thought content normal.    Encounter Medications:   Outpatient Encounter Prescriptions as of 08/05/2015  Medication Sig Note  . allopurinol (ZYLOPRIM) 300 MG tablet take 1 tablet by mouth once daily (Patient taking differently: Take 300 mg by mouth daily. )   . atorvastatin (LIPITOR) 80 MG tablet Take 1 tablet by mouth  daily   . Blood Glucose Monitoring Suppl (ONE TOUCH ULTRA 2) w/Device KIT Use as directed   . carvedilol (COREG) 25 MG tablet Take 1 tablet by mouth two  times daily   . Cholecalciferol (VITAMIN D) 1000 UNITS capsule Take 1,000 Units by mouth daily. Reported on 02/15/2015 02/15/2015: Taking multivitamin   . fluticasone (FLONASE) 50 MCG/ACT nasal spray Use 2 sprays in each  nostril every day   . furosemide (LASIX) 80 MG tablet Take 1 tablet (80 mg total) by  mouth 2 (two) times daily. 05/24/2015: .  . gabapentin (NEURONTIN) 300 MG capsule Take 2 capsules by mouth 3  times daily   . glipiZIDE (GLUCOTROL XL) 10 MG 24 hr tablet Take 1 tablet by mouth  daily with breakfast   . glucose blood (ONE TOUCH ULTRA TEST) test strip Use to check blood sugars twice a day Dx E11.9   . ibuprofen (ADVIL,MOTRIN) 200 MG tablet Take 800 mg by mouth daily as needed for mild pain or moderate pain.  12/03/2014: On hand; will not take while using prescribed narcotic analgesic  . lisinopril (PRINIVIL,ZESTRIL) 2.5 MG tablet Take 1  tablet (2.5 mg total) by mouth daily.   . metFORMIN (GLUCOPHAGE) 500 MG tablet Take 2 tablets by mouth two times daily   . Multiple Vitamin (MULTIVITAMIN WITH MINERALS) TABS tablet Take 1 tablet by mouth every morning.   . potassium chloride SA (K-DUR,KLOR-CON) 20 MEQ tablet Take 1 tablet by mouth  every evening   . sitaGLIPtin (JANUVIA) 100 MG tablet Take 1 tablet (100 mg total) by mouth daily. (Patient taking differently: Take 100 mg by mouth every evening. )   . tamsulosin (FLOMAX) 0.4 MG CAPS capsule Take 1 capsule by mouth  every 12 hours   . tiZANidine (ZANAFLEX) 4 MG tablet TAKE 1 TABLET BY MOUTH  EVERY 6 HOURS AS NEEDED FOR MUSCLE SPASMS.   Marland Kitchen traMADol (ULTRAM) 50 MG tablet Take 1 tablet (50 mg total) by mouth every 8 (eight) hours as needed.   . triamcinolone cream (KENALOG) 0.1 % Apply 1 application topically 2 (two) times daily.   Marland Kitchen warfarin (COUMADIN) 5 MG tablet Take 1 tablet by mouth daily or as directed by coumadin clinic (Patient taking differently: Take 5 mg by mouth as directed. Take 1 tablet by mouth daily on Monday ,Wednesday, Friday. 2.5 mg Tuesday, Thursday, Saturday, Sunday.)   . predniSONE (DELTASONE) 10 MG tablet 2 tabs by mouth per day for 5 days (Patient not taking: Reported on 08/05/2015) 07/20/2015: FINISHED COURSE 6/19   Fall/Depression Screening:    PHQ 2/9 Scores 08/05/2015 06/15/2015 03/22/2015 12/03/2014 10/15/2014 09/16/2014 08/19/2014  PHQ - 2 Score 0 1 0 0 0 0 0    Assessment:  [name] is a 70 year old gentleman living in [place] with his wife who is wheelchair bound. He had back surgery on 12/03/14 and has had significant improvement in gait, pain management, and mobility. He recently was hospitalized with right lower extremity cellulitis and continued treatment as an outpatient with oral antibiotics and [employer]. He has DM and CHF.   Cellulitis - He says he saw [name] in the office on 07/20/15 and his leg was examined. He feels it is improved. His Una boot is off  and he is now wearing a compression sleeve on the leg. He says the sleeve feels good and makes him feel his leg is well supported.   Fall/Left Hip Pain - Mr. Burdo also saw Dr. Trenton Gammon in the office for follow up of left hip pain after his recent fall. Mr. Laskaris reports he had xrays and was told he has "separation of the muscle from the bone and a hairline fracture". Per Mr. Matich, he will be treated conservatively with rest and pain medication until it heals. He still has hip pain today but states it is fairly well controlled with the prescribed medication and changing positions and moving. He cannot sit for more than about an hour without worsening discomfort. His gait is certainly affected  and notable. He walks with a cane in the home. He has had no further falls.   Chronic Health Condition (DM) - Mr. Paolini adherence to DM plan of care has improved over the last month; he is managing his diet more closely.   Today's CBG: 185 7 day average = 157 (298 2 weeks ago) (31monthago = 130 (n=18) 14 day average = 161 (235 2 weeks ago)  (1 month ago = 152 (n=36) 30 day average = 203 (194 2 weeks ago) ( 1 month ago = 144 (n=75)   Chronic Health Condition (CHF) - Mr. PValverdehas not had CHF exacerbation symptoms; he has no edema but mild soft tissue swelling in the right foot/ankle as per his baseline; Mr. PDessertis taking Lasix 845mBID as prescribed; he does not consistently adhere to a sodium restricted diet; weight is 207# today    Plan:  Mr. PiBraccoill report any new symptoms related to skin condition.   Mr. PiButlerill check cbg's and weigh daily and record, notifying provider for findings outside established parameters.   Mr. PiLamereill call for any new or worsened symptoms related to his CHF diagnosis.   I will see Mr. PiBehrendt home again next month.   THN CM Care Plan Problem One        Most Recent Value   Care Plan Problem One  Acute Health Condition (Cellulitis RLE)    Role Documenting the Problem One  Care Management Coordinator   Care Plan for Problem One  Active   THN Long Term Goal (31-90 days)  Over the next 31 days., patient will verbalize understanding of plan of care for treatment of recurrent cellulitis   THN Long Term Goal Start Date  07/16/15   Interventions for Problem One Long Term Goal  Utilizing teachback method, reviewed with patient instructions as provided by his primary care provider and [facility] and Una boot   THN CM Short Term Goal #1 (0-30 days)  Over the next 30 days, patient will report new or worsened symptoms related to recurrent cellulitis   THN CM Short Term Goal #1 Start Date  07/16/15   Interventions for Short Term Goal #1  Utilizing teachback method, reviewed signs and symptoms of worsening skin condition,  prescribed Emmi educational materials   THN CM Short Term Goal #2 (0-30 days)  Over the next 30 days, patient will see provider in follow up as scheduled   THN CM Short Term Goal #2 Start Date  07/16/15   Interventions for Short Term Goal #2  Confirmed plans/instructions provided by MD for follow up    [facility] Problem Two        Most Recent Value   Care Plan Problem Two  Poor Management of Chronic Health condition (DMII)   Role Documenting the Problem Two  Care Management Coordinator   Care Plan for Problem Two  Active   Interventions for Problem Two Long Term Goal   Utilizing teachback method, discussed with patient/spouse importance of ongoing careful monitoring and self health management of DMII   THN Long Term Goal (31-90) days  Over the next 60 days patient will verbalize understanding of plan of care for self mangement of DMII   THN Long Term Goal Start Date  06/15/15    [facility] Problem Three        Most Recent Value   Care Plan Problem Three  Fall Risk/Recent Fall  Role Documenting the Problem Three  Care Management Coordinator   Care Plan for Problem Three  Active   THN Long Term  Goal (31-90) days  Over the next 31 days, patient will verbalize understanding of plan of care for injury sustained from fall    [place] Term Goal Start Date  07/16/15   Interventions for Problem Three Long Term Goal  Discussed with patient plans as outlined by his provider for follow up of injury sustained in recent fall   THN CM Short Term Goal #1 (0-30 days)  Over the next 30 days, patient will see provider in consultation for injury sustained in fall    THN CM Short Term Goal #1 Start Date  07/16/15   Interventions for Short Term Goal #1  Discussed referral plans with patient   THN CM Short Term Goal #2 (0-30 days)  Over the next 30 days, patient will exercise fall precautions as taught to him by his physical therapist   THN CM Short Term Goal #2 Start Date  07/16/15   Interventions for Short Term Goal #2  Reviewed previously taught fall prevention stsrategies    THN CM Short Term Goal #3 (0-30 days)  Over the next 30 days, patient will exercise caution in assisting spouse with physical/hands on needs   [facility]  CM Short Term Goal #3 Start Date  07/16/15   Interventions for Short Term Goal #3  Discussed with patient the importance of asking for assistance when helping wheelchair bound spouse with phsyical care       [facility]  [phone]

## 2015-08-11 ENCOUNTER — Ambulatory Visit (INDEPENDENT_AMBULATORY_CARE_PROVIDER_SITE_OTHER): Payer: Medicare Other | Admitting: Pharmacist

## 2015-08-11 DIAGNOSIS — N400 Enlarged prostate without lower urinary tract symptoms: Secondary | ICD-10-CM | POA: Diagnosis not present

## 2015-08-11 DIAGNOSIS — I25810 Atherosclerosis of coronary artery bypass graft(s) without angina pectoris: Secondary | ICD-10-CM | POA: Diagnosis not present

## 2015-08-11 DIAGNOSIS — I482 Chronic atrial fibrillation, unspecified: Secondary | ICD-10-CM

## 2015-08-11 DIAGNOSIS — D649 Anemia, unspecified: Secondary | ICD-10-CM | POA: Diagnosis not present

## 2015-08-11 DIAGNOSIS — Z4800 Encounter for change or removal of nonsurgical wound dressing: Secondary | ICD-10-CM | POA: Diagnosis not present

## 2015-08-11 DIAGNOSIS — Z7901 Long term (current) use of anticoagulants: Secondary | ICD-10-CM

## 2015-08-11 DIAGNOSIS — E785 Hyperlipidemia, unspecified: Secondary | ICD-10-CM | POA: Diagnosis not present

## 2015-08-11 DIAGNOSIS — I110 Hypertensive heart disease with heart failure: Secondary | ICD-10-CM | POA: Diagnosis not present

## 2015-08-11 DIAGNOSIS — I4891 Unspecified atrial fibrillation: Secondary | ICD-10-CM | POA: Diagnosis not present

## 2015-08-11 DIAGNOSIS — L97219 Non-pressure chronic ulcer of right calf with unspecified severity: Secondary | ICD-10-CM | POA: Diagnosis not present

## 2015-08-11 DIAGNOSIS — I872 Venous insufficiency (chronic) (peripheral): Secondary | ICD-10-CM | POA: Diagnosis not present

## 2015-08-11 DIAGNOSIS — E1140 Type 2 diabetes mellitus with diabetic neuropathy, unspecified: Secondary | ICD-10-CM | POA: Diagnosis not present

## 2015-08-11 DIAGNOSIS — I5032 Chronic diastolic (congestive) heart failure: Secondary | ICD-10-CM | POA: Diagnosis not present

## 2015-08-11 DIAGNOSIS — Z794 Long term (current) use of insulin: Secondary | ICD-10-CM | POA: Diagnosis not present

## 2015-08-11 DIAGNOSIS — Z5181 Encounter for therapeutic drug level monitoring: Secondary | ICD-10-CM | POA: Diagnosis not present

## 2015-08-11 DIAGNOSIS — M4806 Spinal stenosis, lumbar region: Secondary | ICD-10-CM | POA: Diagnosis not present

## 2015-08-11 LAB — POCT INR: INR: 3.1

## 2015-08-16 ENCOUNTER — Other Ambulatory Visit: Payer: Self-pay | Admitting: Internal Medicine

## 2015-08-17 ENCOUNTER — Ambulatory Visit (INDEPENDENT_AMBULATORY_CARE_PROVIDER_SITE_OTHER): Payer: Medicare Other | Admitting: Pharmacist

## 2015-08-17 DIAGNOSIS — Z7901 Long term (current) use of anticoagulants: Secondary | ICD-10-CM

## 2015-08-17 DIAGNOSIS — I483 Typical atrial flutter: Secondary | ICD-10-CM

## 2015-08-17 DIAGNOSIS — I482 Chronic atrial fibrillation, unspecified: Secondary | ICD-10-CM

## 2015-08-17 LAB — POCT INR: INR: 1.9

## 2015-09-02 ENCOUNTER — Ambulatory Visit (INDEPENDENT_AMBULATORY_CARE_PROVIDER_SITE_OTHER): Payer: Medicare Other | Admitting: Pharmacist

## 2015-09-02 DIAGNOSIS — I482 Chronic atrial fibrillation, unspecified: Secondary | ICD-10-CM

## 2015-09-02 DIAGNOSIS — I483 Typical atrial flutter: Secondary | ICD-10-CM

## 2015-09-02 DIAGNOSIS — Z7901 Long term (current) use of anticoagulants: Secondary | ICD-10-CM | POA: Diagnosis not present

## 2015-09-02 LAB — POCT INR: INR: 2

## 2015-09-03 ENCOUNTER — Ambulatory Visit: Payer: Medicare Other | Admitting: Internal Medicine

## 2015-09-03 ENCOUNTER — Telehealth: Payer: Self-pay | Admitting: Emergency Medicine

## 2015-09-03 NOTE — Telephone Encounter (Signed)
Yes, please.

## 2015-09-03 NOTE — Telephone Encounter (Signed)
Called patient. He was unaware of appointment on 8/4. He resch for 09/08/15 at 1045 am. Thanks.

## 2015-09-03 NOTE — Telephone Encounter (Signed)
Pt was a no show on 09/03/15 for a 3 mo follow up. Would you like me to call him back to reschedule?

## 2015-09-08 ENCOUNTER — Ambulatory Visit (INDEPENDENT_AMBULATORY_CARE_PROVIDER_SITE_OTHER): Payer: Medicare Other | Admitting: Internal Medicine

## 2015-09-08 ENCOUNTER — Encounter: Payer: Self-pay | Admitting: Internal Medicine

## 2015-09-08 VITALS — BP 124/66 | HR 91 | Temp 97.5°F | Resp 18 | Ht 71.0 in | Wt 211.0 lb

## 2015-09-08 DIAGNOSIS — E785 Hyperlipidemia, unspecified: Secondary | ICD-10-CM

## 2015-09-08 DIAGNOSIS — R6889 Other general symptoms and signs: Secondary | ICD-10-CM

## 2015-09-08 DIAGNOSIS — I10 Essential (primary) hypertension: Secondary | ICD-10-CM | POA: Diagnosis not present

## 2015-09-08 DIAGNOSIS — E1140 Type 2 diabetes mellitus with diabetic neuropathy, unspecified: Secondary | ICD-10-CM | POA: Diagnosis not present

## 2015-09-08 DIAGNOSIS — E1165 Type 2 diabetes mellitus with hyperglycemia: Secondary | ICD-10-CM

## 2015-09-08 DIAGNOSIS — IMO0002 Reserved for concepts with insufficient information to code with codable children: Secondary | ICD-10-CM

## 2015-09-08 DIAGNOSIS — N289 Disorder of kidney and ureter, unspecified: Secondary | ICD-10-CM | POA: Insufficient documentation

## 2015-09-08 DIAGNOSIS — Z0001 Encounter for general adult medical examination with abnormal findings: Secondary | ICD-10-CM

## 2015-09-08 DIAGNOSIS — D649 Anemia, unspecified: Secondary | ICD-10-CM

## 2015-09-08 NOTE — Assessment & Plan Note (Signed)
stable overall by history and exam, recent data reviewed with pt, and pt to continue medical treatment as before,  to f/u any worsening symptoms or concerns BP Readings from Last 3 Encounters:  09/08/15 124/66  08/05/15 126/68  07/28/15 118/78

## 2015-09-08 NOTE — Assessment & Plan Note (Signed)
stable overall by history and exam, recent data reviewed with pt, and pt to continue medical treatment as before,  to f/u any worsening symptoms or concerns, for f/u lab 

## 2015-09-08 NOTE — Patient Instructions (Signed)

## 2015-09-08 NOTE — Progress Notes (Signed)
Pre visit review using our clinic review tool, if applicable. No additional management support is needed unless otherwise documented below in the visit note. 

## 2015-09-08 NOTE — Assessment & Plan Note (Signed)
Improved last visit, for f/u lab with next labs

## 2015-09-08 NOTE — Assessment & Plan Note (Signed)
stable overall by history and exam, recent data reviewed with pt, and pt to continue medical treatment as before,  to f/u any worsening symptoms or concerns Lab Results  Component Value Date   LDLCALC 55 06/15/2015

## 2015-09-08 NOTE — Progress Notes (Signed)
Subjective:    Patient ID: [name], male    DOB: 10/17/1945, 70 y.o.   MRN: [id number]  HPI    Here to f/u; overall doing ok,  Pt denies chest pain, increasing sob or doe, wheezing, orthopnea, PND, increased LE swelling, palpitations, dizziness or syncope.  Pt denies new neurological symptoms such as new headache, or facial or extremity weakness or numbness.  Pt denies polydipsia, polyuria, or low sugar episode.   Pt denies new neurological symptoms such as new headache, or facial or extremity weakness or numbness.   Pt states overall good compliance with meds, mostly trying to follow appropriate diet, with wt overall stable,  but little exercise however. Walks with cane,  Wears compression stockings bilat with stable persistent improvement in right leg swelling. No new sores or wounds.  BP Readings from Last 3 Encounters:  09/08/15 124/66  08/05/15 126/68  07/28/15 118/78   Wt Readings from Last 3 Encounters:  09/08/15 211 lb (95.7 kg)  08/05/15 207 lb (93.9 kg)  07/28/15 218 lb (98.9 kg)  has seen Dr Jeffie Pollock in the past, but [facility] problem [facility] the [facility] and he wont go back. Has ongoing ED symtpoms, asks for urology referral , such as out of town, but later changes his mind, and does not want referral.  Also does not want blood tests today, but will do next wk. Past Medical History:  Diagnosis Date  . Aortic root dilatation ([place]) 02/02/2011  . Arthritis    "lower back; going back down both my sciatic nerves"  . 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 BENIGN PROSTATIC HYPERTROPHY 08/29/2006   takes Flomax daily  . 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 Cataracts, bilateral    immature  . CHF (congestive heart failure) (HCC)    takes Lasix daily  . Chronic back pain    HNP   . CORONARY ARTERY DISEASE 08/29/2006   takes Coumadin daily  . DEPRESSION 08/29/2006  . DIABETES MELLITUS, TYPE II 08/29/2006   takes Metformin,Januvia,and Glipizide  daily  .  DIASTOLIC HEART FAILURE, CHRONIC 06/09/2008  . GOUT 04/22/2007   takes Allopurinol daily  . History of migraine    21yr ago  . HYPERLIPIDEMIA 08/29/2006   takes Atorvastatin daily  . HYPERTENSION 08/29/2006   takes Lisinopril daily  . INSOMNIA-SLEEP DISORDER-UNSPEC 10/23/2007  . Left lumbar radiculopathy 05/30/2010  . LUMBAR RADICULOPATHY, RIGHT 06/10/2007  . Muscle spasm    takes Zanaflex daily  . [place]   "I've had several MIs"  . 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 PACEMAKER, PERMANENT 03/04/2008   pt denies this date  . Peripheral neuropathy (HCC)    takes Gabapentin daily  . Presence of permanent cardiac pacemaker   . Shortness of breath dyspnea    "all my life" with exertion  . Sleep apnea    "if I lay flat I quit breathing; HOB up I'm fine"   Past Surgical History:  Procedure Laterality Date  . COLONOSCOPY    . CORONARY ANGIOPLASTY WITH STENT PLACEMENT  12/27/10   "I've had a total of 9 cardiac stents put in"  . CORONARY ARTERY BYPASS GRAFT  1992   CABG X 2  . ESOPHAGOGASTRODUODENOSCOPY N/A 12/01/2013   Procedure: ESOPHAGOGASTRODUODENOSCOPY (EGD);  Surgeon: DLafayette Dragon MD;  [facility];  Service: Endoscopy;  Laterality: N/A;  . INSERT / REPLACE / REMOVE PACEMAKER  ~ 2004   initial pacemaker placement  . INSERT /  REPLACE / REMOVE PACEMAKER  10/2009   generator change  . LUMBAR LAMINECTOMY/DECOMPRESSION MICRODISCECTOMY Right 03/23/2014   Procedure: LAMINECTOMY AND FORAMINOTOMY RIGHT LUMBAR THREE-FOUR,LUMBAR FOUR-FIVE, LUMBAR FIVE-SACRAL ONE;  Surgeon: Charlie Pitter, MD;  Location: [facility];  Service: Neurosurgery;  Laterality: Right;  right  . LUMBAR LAMINECTOMY/DECOMPRESSION MICRODISCECTOMY Left 12/01/2014   Procedure: Left Lumbar Three-Four, Lumbar Four-Five Laminectomy and Foraminotomy;  Surgeon: Earnie Larsson, MD;  Location: [facility];  Service: Neurosurgery;  Laterality: Left;  . s/p left arm surgury after work accident   1991   "2000# steel fell on it"  . s/p right hand surgury for foreign object  1970's   "piece of wood went in my hand; had to get that out"    reports that he quit smoking about 42 years ago. His smoking use included Cigarettes. He has a 27.00 pack-year smoking history. He has quit using smokeless tobacco. He reports that he does not drink alcohol or use drugs. family history includes Coronary artery disease (age of onset: 70) in his other; Diabetes in his mother, other, and sister; Heart disease in his sister and sister; Lung cancer in his sister. Allergies  Allergen Reactions  . Crestor [Rosuvastatin Calcium] Other (See Comments)    Urination of blood   Current Outpatient Prescriptions on File Prior to Visit  Medication Sig Dispense Refill  . allopurinol (ZYLOPRIM) 300 MG tablet take 1 tablet by mouth once daily (Patient taking differently: Take 300 mg by mouth daily. ) 90 tablet 3  . atorvastatin (LIPITOR) 80 MG tablet Take 1 tablet by mouth  daily 90 tablet 2  . Blood Glucose Monitoring Suppl (ONE TOUCH ULTRA 2) w/Device KIT Use as directed 1 each 0  . carvedilol (COREG) 25 MG tablet Take 1 tablet by mouth two  times daily 180 tablet 2  . Cholecalciferol (VITAMIN D) 1000 UNITS capsule Take 1,000 Units by mouth daily. Reported on 02/15/2015    . fluticasone (FLONASE) 50 MCG/ACT nasal spray Use 2 sprays in each  nostril every day 48 g 2  . furosemide (LASIX) 80 MG tablet Take 1 tablet (80 mg total) by mouth 2 (two) times daily. 60 tablet 5  . gabapentin (NEURONTIN) 300 MG capsule Take 2 capsules by mouth 3  times daily 540 capsule 2  . glipiZIDE (GLUCOTROL XL) 10 MG 24 hr tablet Take 1 tablet by mouth  daily with breakfast 90 tablet 2  . glucose blood (ONE TOUCH ULTRA TEST) test strip Use to check blood sugars twice a day Dx E11.9 200 each 2  . ibuprofen (ADVIL,MOTRIN) 200 MG tablet Take 800 mg by mouth daily as needed for mild pain or moderate pain.     Marland Kitchen lisinopril (PRINIVIL,ZESTRIL)  2.5 MG tablet Take 1 tablet (2.5 mg total) by mouth daily. 90 tablet 3  . metFORMIN (GLUCOPHAGE) 500 MG tablet Take 2 tablets by mouth two times daily 360 tablet 2  . Multiple Vitamin (MULTIVITAMIN WITH MINERALS) TABS tablet Take 1 tablet by mouth every morning.    . potassium chloride SA (K-DUR,KLOR-CON) 20 MEQ tablet Take 1 tablet by mouth  every evening 90 tablet 2  . sitaGLIPtin (JANUVIA) 100 MG tablet Take 1 tablet (100 mg total) by mouth daily. (Patient taking differently: Take 100 mg by mouth every evening. ) 90 tablet 3  . tamsulosin (FLOMAX) 0.4 MG CAPS capsule Take 1 capsule by mouth  every 12 hours 180 capsule 2  . tiZANidine (ZANAFLEX) 4 MG tablet TAKE 1 TABLET BY  MOUTH  EVERY 6 HOURS AS NEEDED FOR MUSCLE SPASMS. 180 tablet 2  . traMADol (ULTRAM) 50 MG tablet Take 1 tablet (50 mg total) by mouth every 8 (eight) hours as needed. 90 tablet 1  . triamcinolone cream (KENALOG) 0.1 % Apply 1 application topically 2 (two) times daily. 30 g 0  . warfarin (COUMADIN) 5 MG tablet Take 1 tablet by mouth daily or as directed by coumadin clinic (Patient taking differently: Take 5 mg by mouth as directed. Take 1 tablet by mouth daily on Monday ,Wednesday, Friday. 2.5 mg Tuesday, Thursday, Saturday, Sunday.) 90 tablet 1   No current facility-administered medications on file prior to visit.    Review of Systems  Constitutional: Negative for unusual diaphoresis or night sweats HENT: Negative for ear swelling or discharge Eyes: Negative for worsening visual haziness  Respiratory: Negative for choking and stridor.   Gastrointestinal: Negative for distension or worsening eructation Genitourinary: Negative for retention or change in urine volume.  Musculoskeletal: Negative for other MSK pain or swelling Skin: Negative for color change and worsening wound Neurological: Negative for tremors and numbness other than noted  Psychiatric/Behavioral: Negative for decreased concentration or agitation other than  above       Objective:   Physical Exam BP 124/66   Pulse 91   Temp 97.5 F (36.4 C) (Oral)   Resp 18   Ht '5\' 11"'  (1.803 m)   Wt 211 lb (95.7 kg)   SpO2 95%   BMI 29.43 kg/m  VS noted,  Constitutional: Pt appears in no apparent distress HENT: Head: NCAT.  Right Ear: External ear normal.  Left Ear: External ear normal.  Eyes: . Pupils are equal, round, and reactive to light. Conjunctivae and EOM are normal Neck: Normal range of motion. Neck supple.  Cardiovascular: Normal rate and regular rhythm.   Pulmonary/Chest: Effort normal and breath sounds without rales or wheezing.  Abd:  Soft, NT, ND, + BS Neurological: Pt is alert. Not confused , motor grossly intact Skin: Skin is warm. No rash, trace  RLE edema Psychiatric: Pt behavior is normal. No agitation.  Lab Results  Component Value Date   HGBA1C 7.1 (H) 06/15/2015   Lab Results  Component Value Date   PSA 3.06 06/15/2015   PSA 2.90 02/24/2014   PSA 3.58 05/07/2013   Lab Results  Component Value Date   CREATININE 1.37 06/15/2015   BUN 28 (H) 06/15/2015   NA 138 06/15/2015   K 4.7 06/15/2015   CL 101 06/15/2015   CO2 31 06/15/2015       Assessment & Plan:

## 2015-09-08 NOTE — Assessment & Plan Note (Signed)
Slight worse last visit with increased cr, for f/u lab

## 2015-09-13 ENCOUNTER — Ambulatory Visit (INDEPENDENT_AMBULATORY_CARE_PROVIDER_SITE_OTHER): Payer: Medicare Other | Admitting: Family

## 2015-09-13 ENCOUNTER — Encounter: Payer: Self-pay | Admitting: Family

## 2015-09-13 DIAGNOSIS — L600 Ingrowing nail: Secondary | ICD-10-CM | POA: Insufficient documentation

## 2015-09-13 NOTE — Progress Notes (Signed)
Subjective:    Patient ID: [name], male    DOB: [birth year], 70 y.o.   MRN: [id number]  Chief Complaint  Patient presents with  . Foot Pain    x2 weeks, have right foot pain from big toe, big toe is swollen and red, does not think he hit it     HPI:  [name] is a 70 y.o. 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); BENIGN PROSTATIC HYPERTROPHY (08/29/2006); CAD, AUTOLOGOUS BYPASS GRAFT (03/04/2008); Cataracts, bilateral; CHF (congestive heart failure) ([place]); Chronic back pain; CORONARY ARTERY DISEASE (08/29/2006); DEPRESSION (08/29/2006); DIABETES MELLITUS, TYPE II (7/48/2707); DIASTOLIC HEART FAILURE, CHRONIC (06/09/2008); GOUT (04/22/2007); History of migraine; HYPERLIPIDEMIA (08/29/2006); HYPERTENSION (08/29/2006); INSOMNIA-SLEEP DISORDER-UNSPEC (10/23/2007); Left lumbar radiculopathy (05/30/2010); LUMBAR RADICULOPATHY, RIGHT (06/10/2007); Muscle spasm; Myocardial infarction ([place]) (12/27/10); NEOPLASM, MALIGNANT, PROSTATE (11/26/2006); PACEMAKER, PERMANENT (03/04/2008); Peripheral neuropathy ([place]); Presence of permanent cardiac pacemaker; Shortness of breath dyspnea; and Sleep apnea. and presents today For an acute office visit.  This is a new problem. Associated symptom of pain located in his right foot has been going on for approximately 2 weeks. Right great toes described as swollen and red with no significant trauma or injury that he can recall. Modifying factors include a pain pill which has helped a little that numbs it. Course of the symptoms are getting worse since initial onset.   Allergies  Allergen Reactions  . Crestor [Rosuvastatin Calcium] Other (See Comments)    Urination of blood     Current Outpatient Prescriptions on File Prior to Visit  Medication Sig Dispense Refill  . allopurinol (ZYLOPRIM) 300 MG tablet take 1 tablet by mouth once daily (Patient taking differently:  Take 300 mg by mouth daily. ) 90 tablet 3  . atorvastatin (LIPITOR) 80 MG tablet Take 1 tablet by mouth  daily 90 tablet 2  . Blood Glucose Monitoring Suppl (ONE TOUCH ULTRA 2) w/Device KIT Use as directed 1 each 0  . carvedilol (COREG) 25 MG tablet Take 1 tablet by mouth two  times daily 180 tablet 2  . Cholecalciferol (VITAMIN D) 1000 UNITS capsule Take 1,000 Units by mouth daily. Reported on 02/15/2015    . fluticasone (FLONASE) 50 MCG/ACT nasal spray Use 2 sprays in each  nostril every day 48 g 2  . furosemide (LASIX) 80 MG tablet Take 1 tablet (80 mg total) by mouth 2 (two) times daily. 60 tablet 5  . gabapentin (NEURONTIN) 300 MG capsule Take 2 capsules by mouth 3  times daily 540 capsule 2  . glipiZIDE (GLUCOTROL XL) 10 MG 24 hr tablet Take 1 tablet by mouth  daily with breakfast 90 tablet 2  . glucose blood (ONE TOUCH ULTRA TEST) test strip Use to check blood sugars twice a day Dx E11.9 200 each 2  . ibuprofen (ADVIL,MOTRIN) 200 MG tablet Take 800 mg by mouth daily as needed for mild pain or moderate pain.     Marland Kitchen lisinopril (PRINIVIL,ZESTRIL) 2.5 MG tablet Take 1 tablet (2.5 mg total) by mouth daily. 90 tablet 3  . metFORMIN (GLUCOPHAGE) 500 MG tablet Take 2 tablets by mouth two times daily 360 tablet 2  . Multiple Vitamin (MULTIVITAMIN WITH MINERALS) TABS tablet Take 1 tablet by mouth every morning.    . potassium chloride SA (K-DUR,KLOR-CON) 20 MEQ tablet Take 1 tablet by mouth  every evening 90 tablet 2  . sitaGLIPtin (JANUVIA) 100 MG tablet Take 1 tablet (100  mg total) by mouth daily. (Patient taking differently: Take 100 mg by mouth every evening. ) 90 tablet 3  . tamsulosin (FLOMAX) 0.4 MG CAPS capsule Take 1 capsule by mouth  every 12 hours 180 capsule 2  . tiZANidine (ZANAFLEX) 4 MG tablet TAKE 1 TABLET BY MOUTH  EVERY 6 HOURS AS NEEDED FOR MUSCLE SPASMS. 180 tablet 2  . traMADol (ULTRAM) 50 MG tablet Take 1 tablet (50 mg total) by mouth every 8 (eight) hours as needed. 90 tablet 1    . triamcinolone cream (KENALOG) 0.1 % Apply 1 application topically 2 (two) times daily. 30 g 0  . warfarin (COUMADIN) 5 MG tablet Take 1 tablet by mouth daily or as directed by coumadin clinic (Patient taking differently: Take 5 mg by mouth as directed. Take 1 tablet by mouth daily on Monday ,Wednesday, Friday. 2.5 mg Tuesday, Thursday, Saturday, Sunday.) 90 tablet 1   No current facility-administered medications on file prior to visit.      Review of Systems  Constitutional: Negative for chills and fever.  Skin:       Positive for right toe redness  Neurological: Negative for weakness and numbness.      Objective:    BP 120/70 (BP Location: Right Arm, Patient Position: Sitting, Cuff Size: Normal)   Pulse 81   Temp 98 F (36.7 C) (Oral)   Resp 18   Ht _0  (1.803 m)   Wt 216 lb (98 kg)   SpO2 91%   BMI 30.13 kg/m  Nursing note and vital signs reviewed.  Physical Exam  Constitutional: He is oriented to person, place, and time. He appears well-developed and well-nourished. No distress.  Cardiovascular: Normal rate, regular rhythm, normal heart sounds and intact distal pulses.   Pulmonary/Chest: Effort normal and breath sounds normal.  Musculoskeletal:  Right great toe with mild redness and inflammation around the lateral aspect of the great toe nail.  There is tenderness with on warmth. Capillary refill is intact and appropriate.   Neurological: He is alert and oriented to person, place, and time.  Skin: Skin is warm and dry.  Psychiatric: He has a normal mood and affect. His behavior is normal. Judgment and thought content normal.       Assessment & Plan:   Problem List Items Addressed This Visit      Musculoskeletal and Integument   Ingrown toenail    Symptoms and exam consistent with an ingrown toenail on the lateral side of the great toe. Treat conservatively with warm water soaks and epson salt. Referral placed to podiatry for toe nail removal secondary to  diabetes. Follow up if symptoms worsen or do not improve.       Relevant Orders   Ambulatory referral to Podiatry    Other Visit Diagnoses   None.      I am having Mr. Sequeira maintain his ibuprofen, multivitamin with minerals, Vitamin D, allopurinol, furosemide, lisinopril, warfarin, sitaGLIPtin, triamcinolone cream, glucose blood, tiZANidine, fluticasone, gabapentin, potassium chloride SA, ONE TOUCH ULTRA 2, atorvastatin, metFORMIN, glipiZIDE, carvedilol, tamsulosin, and traMADol.    Follow-up: Return if symptoms worsen or fail to improve.  Mauricio Po, FNP

## 2015-09-13 NOTE — Patient Instructions (Addendum)
Thank you for choosing [facility].  Summary/Instructions:  They will call with your referral to Podiatry.   Warm water soaks with Epson salt.   May need to go to Urgent Care if your appointment is too far away.  If your symptoms worsen or fail to improve, please contact our office for further instruction, or in case of emergency go directly to the emergency room at the closest medical facility.   Ingrown Toenail An ingrown toenail occurs when the corner or sides of your toenail grow into the surrounding skin. The big toe is most commonly affected, but it can happen to any of your toes. If your ingrown toenail is not treated, you will be at risk for infection. CAUSES This condition may be caused by:  Wearing shoes that are too small or tight.  Injury or trauma, such as stubbing your toe or having your toe stepped on.  Improper cutting or care of your toenails.  Being born with (congenital) nail or foot abnormalities, such as having a nail that is too big for your toe. RISK FACTORS Risk factors for an ingrown toenail include:  Age. Your nails tend to thicken as you get older, so ingrown nails are more common in older people.  Diabetes.  Cutting your toenails incorrectly.  Blood circulation problems. SYMPTOMS Symptoms may include:  Pain, soreness, or tenderness.  Redness.  Swelling.  Hardening of the skin surrounding the toe. Your ingrown toenail may be infected if there is fluid, pus, or drainage. DIAGNOSIS  An ingrown toenail may be diagnosed by medical history and physical exam. If your toenail is infected, your health care provider may test a sample of the drainage. TREATMENT Treatment depends on the severity of your ingrown toenail. Some ingrown toenails may be treated at home. More severe or infected ingrown toenails may require surgery to remove all or part of the nail. Infected ingrown toenails may also be treated with antibiotic medicines. HOME CARE  INSTRUCTIONS  If you were prescribed an antibiotic medicine, finish all of it even if you start to feel better.  Soak your foot in warm soapy water for 20 minutes, 3 times per day or as directed by your health care provider.  Carefully lift the edge of the nail away from the sore skin by wedging a small piece of cotton under the corner of the nail. This may help with the pain. Be careful not to cause more injury to the area.  Wear shoes that fit well. If your ingrown toenail is causing you pain, try wearing sandals, if possible.  Trim your toenails regularly and carefully. Do not cut them in a curved shape. Cut your toenails straight across. This prevents injury to the skin at the corners of the toenail.  Keep your feet clean and dry.  If you are having trouble walking and are given crutches by your health care provider, use them as directed.  Do not pick at your toenail or try to remove it yourself.  Take medicines only as directed by your health care provider.  Keep all follow-up visits as directed by your health care provider. This is important. SEEK MEDICAL CARE IF:  Your symptoms do not improve with treatment. SEEK IMMEDIATE MEDICAL CARE IF:  You have red streaks that start at your foot and go up your leg.  You have a fever.  You have increased redness, swelling, or pain.  You have fluid, blood, or pus coming from your toenail.   This information is not  intended to replace advice given to you by your health care provider. Make sure you discuss any questions you have with your health care provider.   Document Released: 01/14/2000 Document Revised: 06/02/2014 Document Reviewed: 12/10/2013 Elsevier Interactive Patient Education [employer].

## 2015-09-13 NOTE — Assessment & Plan Note (Signed)
Symptoms and exam consistent with an ingrown toenail on the lateral side of the great toe. Treat conservatively with warm water soaks and epson salt. Referral placed to podiatry for toe nail removal secondary to diabetes. Follow up if symptoms worsen or do not improve.

## 2015-09-14 ENCOUNTER — Encounter (HOSPITAL_COMMUNITY): Payer: Self-pay | Admitting: *Deleted

## 2015-09-14 ENCOUNTER — Emergency Department (HOSPITAL_COMMUNITY)
Admission: EM | Admit: 2015-09-14 | Discharge: 2015-09-14 | Disposition: A | Discharge status: Home / Self Care | Payer: Medicare Other | Attending: Emergency Medicine | Admitting: Emergency Medicine

## 2015-09-14 ENCOUNTER — Other Ambulatory Visit: Payer: Self-pay | Admitting: *Deleted

## 2015-09-14 ENCOUNTER — Encounter: Payer: Self-pay | Admitting: *Deleted

## 2015-09-14 DIAGNOSIS — I110 Hypertensive heart disease with heart failure: Secondary | ICD-10-CM | POA: Diagnosis not present

## 2015-09-14 DIAGNOSIS — Z7984 Long term (current) use of oral hypoglycemic drugs: Secondary | ICD-10-CM | POA: Diagnosis not present

## 2015-09-14 DIAGNOSIS — I5032 Chronic diastolic (congestive) heart failure: Secondary | ICD-10-CM | POA: Diagnosis not present

## 2015-09-14 DIAGNOSIS — E1140 Type 2 diabetes mellitus with diabetic neuropathy, unspecified: Secondary | ICD-10-CM | POA: Insufficient documentation

## 2015-09-14 DIAGNOSIS — Z87891 Personal history of nicotine dependence: Secondary | ICD-10-CM | POA: Diagnosis not present

## 2015-09-14 DIAGNOSIS — Z950 Presence of cardiac pacemaker: Secondary | ICD-10-CM | POA: Insufficient documentation

## 2015-09-14 DIAGNOSIS — Z955 Presence of coronary angioplasty implant and graft: Secondary | ICD-10-CM | POA: Insufficient documentation

## 2015-09-14 DIAGNOSIS — Z7901 Long term (current) use of anticoagulants: Secondary | ICD-10-CM | POA: Diagnosis not present

## 2015-09-14 DIAGNOSIS — Z8546 Personal history of malignant neoplasm of prostate: Secondary | ICD-10-CM | POA: Diagnosis not present

## 2015-09-14 DIAGNOSIS — I2510 Atherosclerotic heart disease of native coronary artery without angina pectoris: Secondary | ICD-10-CM | POA: Insufficient documentation

## 2015-09-14 DIAGNOSIS — L600 Ingrowing nail: Secondary | ICD-10-CM | POA: Insufficient documentation

## 2015-09-14 MED ORDER — LIDOCAINE HCL (PF) 1 % IJ SOLN
30.0000 mL | Freq: Once | INTRAMUSCULAR | Status: AC
  Administered 2015-09-14: 30 mL
  Filled 2015-09-14: qty 30

## 2015-09-14 MED ORDER — CEPHALEXIN 500 MG PO CAPS: 500.0000 mg | ORAL_CAPSULE | Freq: Three times a day (TID) | ORAL | 0 refills | Status: DC

## 2015-09-14 NOTE — ED Provider Notes (Signed)
[facility]-EMERGENCY DEPT Provider Note   CSN: [id number] Arrival date & time: 09/14/15  1432  By signing my name below, I, Soijett Blue, attest that this documentation has been prepared under the direction and in the presence of  Y. , PA-C Electronically Signed: Soijett Blue, ED Scribe. 09/14/15. 3:57 PM.    History   Chief Complaint Chief Complaint  Patient presents with  . Ingrown Toenail    HPI  [name] is a 70 y.o. male with a medical hx of HTN, DM, CAD, CHF, who presents to the Emergency Department complaining of right ingrown toenail onset 1 week. Pt reports that he was seen by his PCP and was informed to come into the ED for evaluation due to his office not having the tools needed for the procedure. Pt reports that his last tetanus was 1 year ago. He states that he is having associated symptoms of right great toe pain. He states that he has tried tramadol with his last dose being last night for the relief for his symptoms. He denies drainage, fever, chills, and any other symptoms.   The history is provided by the patient. No language interpreter was used.    Past Medical History:  Diagnosis Date  . Aortic root dilatation ([facility]) 02/02/2011  . Arthritis    "lower back; going back down both my sciatic nerves"  . 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 BENIGN PROSTATIC HYPERTROPHY 08/29/2006   takes Flomax daily  . 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 Cataracts, bilateral    immature  . CHF (congestive heart failure) ([facility])    takes Lasix daily  . Chronic back pain    HNP   . CORONARY ARTERY DISEASE 08/29/2006   takes Coumadin daily  . DEPRESSION 08/29/2006  . DIABETES MELLITUS, TYPE II 08/29/2006   takes Metformin,Januvia,and Glipizide  daily  . DIASTOLIC HEART FAILURE, CHRONIC 06/09/2008  . GOUT 04/22/2007   takes Allopurinol daily  . History of migraine    30yrs ago  . HYPERLIPIDEMIA 08/29/2006   takes Atorvastatin daily  . HYPERTENSION  08/29/2006   takes Lisinopril daily  . INSOMNIA-SLEEP DISORDER-UNSPEC 10/23/2007  . Left lumbar radiculopathy 05/30/2010  . LUMBAR RADICULOPATHY, RIGHT 06/10/2007  . Muscle spasm    takes Zanaflex daily  . Myocardial infarction ([facility]) 12/27/10   "I've had several MIs"  . 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 PACEMAKER, PERMANENT 03/04/2008   pt denies this date  . Peripheral neuropathy ([facility])    takes Gabapentin daily  . Presence of permanent cardiac pacemaker   . Shortness of breath dyspnea    "all my life" with exertion  . Sleep apnea    "if I lay flat I quit breathing; HOB up I'm fine"    Patient Active Problem List   Diagnosis Date Noted  . Ingrown toenail 09/13/2015  . Renal insufficiency 09/08/2015  . Trochanteric bursitis of left hip 07/13/2015  . Right leg swelling 07/13/2015  . Leg wound, right 06/15/2015  . Rash 06/06/2015  . Anemia 05/24/2015  . Cellulitis of leg, right 05/13/2015  . Greater trochanteric bursitis of left hip 08/12/2014  . Left leg pain 07/15/2014  . 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 Lumbar stenosis with neurogenic claudication 03/23/2014  . Long-term (current) use of anticoagulants 02/27/2014  . Dysphagia, pharyngoesophageal phase 12/01/2013  . 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 Dysphagia 11/11/2013  . Lumbago 11/03/2013  . Low back pain radiating to right leg 11/03/2013  .   Abnormality of gait 11/03/2013  . Difficulty walking 11/03/2013  . Loss of weight 10/29/2013  . Atrial fibrillation ([facility]) 08/26/2013  . Chest pain 04/05/2013  . Plantar fasciitis of right foot 02/18/2013  . Bruit 08/21/2012  . Sciatica of left side 08/15/2012  . Left hip pain 08/15/2012  . Headache(784.0) 05/03/2012  . Increased prostate specific antigen (PSA) velocity 11/11/2011  . Right sided sciatica 11/10/2011  . Chronic LBP 05/03/2011  . Aortic root dilatation ([facility]) 02/02/2011  . Wheezing without  diagnosis of asthma 02/02/2011  . Syncope 12/27/2010  . Left lumbar radiculopathy 05/30/2010  . Encounter for preventative adult health care exam with abnormal findings 05/30/2010  . 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 DIASTOLIC HEART FAILURE, CHRONIC 06/09/2008  . KNEE PAIN, BILATERAL 04/21/2008  . LEG PAIN, BILATERAL 04/21/2008  . 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 PACEMAKER, PERMANENT 03/04/2008  . INSOMNIA-SLEEP DISORDER-UNSPEC 10/23/2007  . Lumbar radiculopathy, chronic 06/10/2007  . GOUT 04/22/2007  . 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 ERECTILE DYSFUNCTION 11/26/2006  . ALLERGIC RHINITIS 11/26/2006  . Type 2 diabetes, uncontrolled, with neuropathy ([facility]) 08/29/2006  . Hyperlipidemia 08/29/2006  . Overweight(278.02) 08/29/2006  . DEPRESSION 08/29/2006  . Essential hypertension 08/29/2006  . CORONARY ARTERY DISEASE 08/29/2006  . BENIGN PROSTATIC HYPERTROPHY 08/29/2006    Past Surgical History:  Procedure Laterality Date  . COLONOSCOPY    . CORONARY ANGIOPLASTY WITH STENT PLACEMENT  12/27/10   "I've had a total of 9 cardiac stents put in"  . CORONARY ARTERY BYPASS GRAFT  1992   CABG X 2  . ESOPHAGOGASTRODUODENOSCOPY N/A 12/01/2013   Procedure: ESOPHAGOGASTRODUODENOSCOPY (EGD);  Surgeon: Dora M Brodie, MD;  Location: [facility] END[facility];  Service: Endoscopy;  Laterality: N/A;  . INSERT / REPLACE / REMOVE PACEMAKER  ~ 2004   initial pacemaker placement  . INSERT / REPLACE / REMOVE PACEMAKER  10/2009   generator change  . LUMBAR LAMINECTOMY/DECOMPRESSION MICRODISCECTOMY Right 03/23/2014   Procedure: LAMINECTOMY AND FORAMINOTOMY RIGHT LUMBAR THREE-FOUR,LUMBAR FOUR-FIVE, LUMBAR FIVE-SACRAL ONE;  Surgeon: Henry A Pool, MD;  Location: [facility];  Service: Neurosurgery;  Laterality: Right;  right  . LUMBAR LAMINECTOMY/DECOMPRESSION MICRODISCECTOMY Left 12/01/2014   Procedure: Left Lumbar Three-Four, Lumbar Four-Five Laminectomy and Foraminotomy;  Surgeon:  Henry Pool, MD;  Location: [facility];  Service: Neurosurgery;  Laterality: Left;  . s/p left arm surgury after work accident  1991   "2000# steel fell on it"  . s/p right hand surgury for foreign object  1970's   "piece of wood went in my hand; had to get that out"       Home Medications    Prior to Admission medications   Medication Sig Start Date End Date Taking? Authorizing Provider  allopurinol (ZYLOPRIM) 300 MG tablet take 1 tablet by mouth once daily Patient taking differently: Take 300 mg by mouth daily.  03/03/14   James W John, MD  atorvastatin (LIPITOR) 80 MG tablet Take 1 tablet by mouth  daily 06/25/15   James W John, MD  Blood Glucose Monitoring Suppl (ONE TOUCH ULTRA 2) w/Device KIT Use as directed 06/25/15   James W John, MD  carvedilol (COREG) 25 MG tablet Take 1 tablet by mouth two  times daily 06/25/15   James W John, MD  Cholecalciferol (VITAMIN D) 1000 UNITS capsule Take 1,000 Units by mouth daily. Reported on 02/15/2015 07/31/13   Historical Provider, MD  fluticasone (FLONASE) 50 MCG/ACT nasal spray Use 2 sprays in each  nostril every   day 06/25/15   James W John, MD  furosemide (LASIX) 80 MG tablet Take 1 tablet (80 mg total) by mouth 2 (two) times daily. 07/15/14   James W John, MD  gabapentin (NEURONTIN) 300 MG capsule Take 2 capsules by mouth 3  times daily 06/25/15   James W John, MD  glipiZIDE (GLUCOTROL XL) 10 MG 24 hr tablet Take 1 tablet by mouth  daily with breakfast 06/25/15   James W John, MD  glucose blood (ONE TOUCH ULTRA TEST) test strip Use to check blood sugars twice a day Dx E11.9 06/10/15   James W John, MD  ibuprofen (ADVIL,MOTRIN) 200 MG tablet Take 800 mg by mouth daily as needed for mild pain or moderate pain.     Historical Provider, MD  lisinopril (PRINIVIL,ZESTRIL) 2.5 MG tablet Take 1 tablet (2.5 mg total) by mouth daily. 03/17/15   James W John, MD  metFORMIN (GLUCOPHAGE) 500 MG tablet Take 2 tablets by mouth two times daily 06/25/15   James W John, MD    Multiple Vitamin (MULTIVITAMIN WITH MINERALS) TABS tablet Take 1 tablet by mouth every morning.    Historical Provider, MD  potassium chloride SA (K-DUR,KLOR-CON) 20 MEQ tablet Take 1 tablet by mouth  every evening 06/25/15   James W John, MD  sitaGLIPtin (JANUVIA) 100 MG tablet Take 1 tablet (100 mg total) by mouth daily. Patient taking differently: Take 100 mg by mouth every evening.  05/13/15   James W John, MD  tamsulosin (FLOMAX) 0.4 MG CAPS capsule Take 1 capsule by mouth  every 12 hours 06/25/15   James W John, MD  tiZANidine (ZANAFLEX) 4 MG tablet TAKE 1 TABLET BY MOUTH  EVERY 6 HOURS AS NEEDED FOR MUSCLE SPASMS. 06/25/15   James W John, MD  traMADol (ULTRAM) 50 MG tablet Take 1 tablet (50 mg total) by mouth every 8 (eight) hours as needed. 07/13/15   James W John, MD  triamcinolone cream (KENALOG) 0.1 % Apply 1 application topically 2 (two) times daily. 06/02/15   James W John, MD  warfarin (COUMADIN) 5 MG tablet Take 1 tablet by mouth daily or as directed by coumadin clinic Patient taking differently: Take 5 mg by mouth as directed. Take 1 tablet by mouth daily on Monday ,Wednesday, Friday. 2.5 mg Tuesday, Thursday, Saturday, Sunday. 03/30/15   Brian S Crenshaw, MD    Family History Family History  Problem Relation Age of Onset  . Diabetes Mother   . Diabetes Sister   . Heart disease Sister     2 sister died with heart disease  . Coronary artery disease Other 50    male, first degree relative  . Diabetes Other     1st degree relative  . Heart disease Sister   . Lung cancer Sister     deceased    Social History Social History  Substance Use Topics  . Smoking status: Former Smoker    Packs/day: 3.00    Years: 9.00    Types: Cigarettes    Quit date: 02/26/1973  . Smokeless tobacco: Former User     Comment: quit smoking 42yrs ago  . Alcohol use No     Allergies   Crestor [rosuvastatin calcium]   Review of Systems Review of Systems A complete 10 system review of systems  was obtained and all systems are negative except as noted in the HPI and PMH.    Physical Exam Updated Vital Signs BP 104/59 (BP Location: Right Arm)   Pulse 62     Temp 98.3 F (36.8 C) (Oral)   Resp 18   Ht 5' 11" (1.803 m)   Wt 210 lb (95.3 kg)   SpO2 96%   BMI 29.29 kg/m   Physical Exam  Constitutional: He is oriented to person, place, and time. He appears well-developed and well-nourished. No distress.  HENT:  Head: Normocephalic and atraumatic.  Eyes: EOM are normal.  Neck: Neck supple.  Cardiovascular: Normal rate.   Pulmonary/Chest: Effort normal. No respiratory distress.  Abdominal: He exhibits no distension.  Musculoskeletal: Normal range of motion.       Right foot: There is tenderness.  Right great toe has erythema and edema along the lateral edge of nail with marked tenderness to palpation. No discharge is expressible. No fluctuance. 2+ dp.   Neurological: He is alert and oriented to person, place, and time.  Skin: Skin is warm and dry. There is erythema.  Psychiatric: He has a normal mood and affect. His behavior is normal.  Nursing note and vitals reviewed.    ED Treatments / Results  DIAGNOSTIC STUDIES: Oxygen Saturation is 96% on RA, nl by my interpretation.    COORDINATION OF CARE: 3:20 PM Discussed treatment plan with pt at bedside which includes right great toe removal and pt agreed to plan.   Procedures Excise ingrown toenail Date/Time: 09/14/2015 3:34 PM Performed by: Anne Ng Authorized by: Anne Ng  Consent: Verbal consent obtained. Risks and benefits: risks, benefits and alternatives were discussed Consent given by: patient Patient understanding: patient states understanding of the procedure being performed Local anesthesia used: yes Anesthesia: digital block  Anesthesia: Local anesthesia used: yes Local Anesthetic: lidocaine 1% without epinephrine Anesthetic total: 6 mL Patient tolerance: Patient tolerated the procedure well  with no immediate complications  .Nerve Block Date/Time: 09/14/2015 3:34 PM Performed by: Carmin Muskrat Authorized by: Carmin Muskrat   Consent:    Consent obtained:  Verbal   Consent given by:  Patient   Procedural risks discussed: ingrown toenail. Indications:    Indications:  Pain relief Location:    Body area:  Lower extremity (great toe)   Laterality:  Right Pre-procedure details:    Preparation: Patient was prepped and draped in usual sterile fashion   Skin anesthesia (see MAR for exact dosages):    Skin anesthesia method:  Local infiltration   Local anesthetic:  Lidocaine 1% w/o epi (6 ml) Post-procedure details:    Dressing:  Sterile dressing   Outcome:  Anesthesia achieved   Patient tolerance of procedure:  Tolerated well, no immediate complications   (including critical care time)  Medications Ordered in ED Medications  lidocaine (PF) (XYLOCAINE) 1 % injection 30 mL (30 mLs Infiltration Given 09/14/15 1524)     Initial Impression / Assessment and Plan / ED Course  I have reviewed the triage vital signs and the nursing notes.   Clinical Course    Pt tolerated digital block and wedge resection of ingrown right great toenail very well. There was no associated paronychia or abscess. Given his medical co-morbidities will send home with course of keflex. Pt has hydrocodone, tramadol, and tylenol at home which we discussed he may take one of those as needed for pain. Instructed warm soaks with epsom salts several times daily and encouraged close PCP follow up. ER return precautions given.  Final Clinical Impressions(s) / ED Diagnoses   Final diagnoses:  Ingrown right big toenail    New Prescriptions Discharge Medication List as of 09/14/2015  4:05 PM  START taking these medications   Details  cephALEXin (KEFLEX) 500 MG capsule Take 1 capsule (500 mg total) by mouth 3 (three) times daily., Starting Tue 09/14/2015, Print        I personally performed the  services described in this documentation, which was scribed in my presence. The recorded information has been reviewed and is accurate.     Y , PA-C 09/15/15 0718    Robert Lockwood, MD 09/15/15 1649  

## 2015-09-14 NOTE — ED Triage Notes (Signed)
Pt c/o ingrown toenail to left great toe for a week.

## 2015-09-14 NOTE — ED Notes (Signed)
Pt verbalized understanding of d/c instructions and has no further questions. Pt stable and NAD. Pt to follow up with pcp.

## 2015-09-14 NOTE — ED Notes (Signed)
[name] Sam PA and this RN in room for procedure to right great toe.

## 2015-09-14 NOTE — Patient Outreach (Signed)
[facility] ([facility]   09/14/2015  Jakyle E Chapel 04/28/1945 [id number]  Jaykob E Dahlen is an 70 y.o. male who is followed by [facility] for DM and HTN Disease Management and care coordination services..   Mr. Kates had back surgery on 12/02/14 and has experienced drastic improvement in mobility and pain management. He has struggled to get back on track with diabetes management but cbg averages (see below) have improved as a result of his work in adhering to his prescribed carb modified diet.   Recently Mr. Gernert was hospitalized with lower extremity cellulitis and was followed at home for several weeks by [facility]. His HHRN applied UNA Boot x 4 weeks and he finished a course of oral antibiotics. His UNA Boot was removed on 06/23/15 and skin appeared healed and intact at that time.    On 07/01/15 I received a call from Mrs. Mahler sharing her concern about "a new place" on Mr. Fink's ankle that was concerning to both of them. I reached out to the primary care provider office, Mr. Kraeger was seen in the office on 6/3, and HH services resumed on 07/06/15 with new application of Una boot.   I am seeing Mr. Mauro today for follow up of lower extremity cellulitis and diabetes disease management.   Subjective: "I feel great and my sugar is doing good. Everything is okay except I have an ingrown toenail that needs to come off."  Objective:  BP 96/62   Pulse 60   SpO2 96%   Review of Systems  Constitutional: Negative.   HENT: Negative.   Eyes: Negative.   Respiratory: Negative for cough, sputum production and shortness of breath.   Cardiovascular: Negative for chest pain, palpitations and leg swelling.  Gastrointestinal: Negative.   Genitourinary: Negative.   Musculoskeletal: Positive for back pain and myalgias. Negative for falls.  Skin:       Right great toe with ingrown toenail and surrounding skin red and tender to touch    Neurological: Negative.   Psychiatric/Behavioral: Negative.     Physical Exam  Constitutional: He is oriented to person, place, and time. Vital signs are normal. He appears well-developed and well-nourished. He is active. He does not have a sickly appearance. He does not appear ill.  Cardiovascular: Normal rate and regular rhythm.   Respiratory: Effort normal and breath sounds normal. He has no wheezes. He has no rhonchi. He has no rales.  GI: Soft. Bowel sounds are normal.  Neurological: He is alert and oriented to person, place, and time.  Skin: Skin is warm, dry and intact.     Psychiatric: He has a normal mood and affect. His speech is normal and behavior is normal. Judgment and thought content normal. Cognition and memory are normal.    Encounter Medications:   Outpatient Encounter Prescriptions as of 09/14/2015  Medication Sig Note  . allopurinol (ZYLOPRIM) 300 MG tablet take 1 tablet by mouth once daily (Patient taking differently: Take 300 mg by mouth daily. )   . atorvastatin (LIPITOR) 80 MG tablet Take 1 tablet by mouth  daily   . Blood Glucose Monitoring Suppl (ONE TOUCH ULTRA 2) w/Device KIT Use as directed   . carvedilol (COREG) 25 MG tablet Take 1 tablet by mouth two  times daily   . Cholecalciferol (VITAMIN D) 1000 UNITS capsule Take 1,000 Units by mouth daily. Reported on 02/15/2015 02/15/2015: Taking multivitamin   . fluticasone (FLONASE) 50 MCG/ACT nasal spray Use 2   sprays in each  nostril every day   . furosemide (LASIX) 80 MG tablet Take 1 tablet (80 mg total) by mouth 2 (two) times daily. 05/24/2015: .  . gabapentin (NEURONTIN) 300 MG capsule Take 2 capsules by mouth 3  times daily   . glipiZIDE (GLUCOTROL XL) 10 MG 24 hr tablet Take 1 tablet by mouth  daily with breakfast   . glucose blood (ONE TOUCH ULTRA TEST) test strip Use to check blood sugars twice a day Dx E11.9   . ibuprofen (ADVIL,MOTRIN) 200 MG tablet Take 800 mg by mouth daily as needed for mild pain or  moderate pain.  12/03/2014: On hand; will not take while using prescribed narcotic analgesic  . lisinopril (PRINIVIL,ZESTRIL) 2.5 MG tablet Take 1 tablet (2.5 mg total) by mouth daily.   . metFORMIN (GLUCOPHAGE) 500 MG tablet Take 2 tablets by mouth two times daily   . Multiple Vitamin (MULTIVITAMIN WITH MINERALS) TABS tablet Take 1 tablet by mouth every morning.   . potassium chloride SA (K-DUR,KLOR-CON) 20 MEQ tablet Take 1 tablet by mouth  every evening   . sitaGLIPtin (JANUVIA) 100 MG tablet Take 1 tablet (100 mg total) by mouth daily. (Patient taking differently: Take 100 mg by mouth every evening. )   . tamsulosin (FLOMAX) 0.4 MG CAPS capsule Take 1 capsule by mouth  every 12 hours   . tiZANidine (ZANAFLEX) 4 MG tablet TAKE 1 TABLET BY MOUTH  EVERY 6 HOURS AS NEEDED FOR MUSCLE SPASMS.   . traMADol (ULTRAM) 50 MG tablet Take 1 tablet (50 mg total) by mouth every 8 (eight) hours as needed.   . triamcinolone cream (KENALOG) 0.1 % Apply 1 application topically 2 (two) times daily.   . warfarin (COUMADIN) 5 MG tablet Take 1 tablet by mouth daily or as directed by coumadin clinic (Patient taking differently: Take 5 mg by mouth as directed. Take 1 tablet by mouth daily on Monday ,Wednesday, Friday. 2.5 mg Tuesday, Thursday, Saturday, Sunday.)    Fall/Depression Screening:    PHQ 2/9 Scores 08/05/2015 06/15/2015 03/22/2015 12/03/2014 10/15/2014 09/16/2014 08/19/2014  PHQ - 2 Score 0 1 0 0 0 0 0    Assessment:  Mr. Haven is a 70 year old gentleman living in [place] with his wife who is wheelchair bound. He had back surgery on 12/03/14 and has had significant improvement in gait, pain management, and mobility. He recently was hospitalized with right lower extremity cellulitis and continued treatment as an outpatient with oral antibiotics and Una Boot. Right lower extremity skin is healed but he has a new right great toenail ingrown today. He has DM and CHF.   Acute Health Condition (ingrown right great  toenail) - Mr. Hinkley has a right great toe ingrown toenail. It is red and tender to touch. Mr. Mccaig states he saw Dr. John in the office yesterday and was advised to go to the urgent care for treatment of same as needed tools/supplies would be available. I tried calling the urgent care today per Mr. Stilley's request to ask about needed supplies/equipment but was unable to speak with anyone and left a message on the nurse line. Mr. Schappell states he will report there today. We reviewed signs and symptoms of infection and I advised Mr. Hing to ask for explicit care instructions when he leaves urgent care.   Cellulitis of Right Lower Extremity (resolved)  - Mr. Fitzgerald has no signs or symptoms of cellulitis today; he has completed all prescribed treatment.      Fall/Left Hip Pain - Mr. Sitzmann also saw Dr. Poole in the office for follow up of left hip pain after his recent fall. Mr. Mehan reports he had xrays and was told he has "separation of the muscle from the bone and a hairline fracture". Per Mr. Dalesandro, he will be treated conservatively with rest and pain medication until it heals. He denies pain in the hip today and says his pain has been well controlled with the prescribed medication and changing positions and moving.His gait is improved and he is not using a cane or other assist device. Mr. Saleeby has had no further falls.   Chronic Health Condition (DM) - Mr. Collings's adherence to DM plan of care has improved over the last month; he is managing his diet more closely and says today he has been focusing on eating lots of garden vegetables this summer.   Today's (fasting) CBG: 133 7 day average = 162 (157 last visit) 14 day average = 149 (161 last visit) 30 day average = 145 (203 last visit)   Chronic Health Condition (CHF) - Mr. Canavan has not had CHF exacerbation symptoms; he has no edema; he is taking oral diuretics BID as prescribed; he does not consistently adhere to a sodium  restricted diet but as noted earlier, has made improvements in his diet over the summer.    Plan:  Mr. Ehly will report any new symptoms related to skin conditions.   Mr. Daigler will check cbg's and weigh daily and record, notifying provider for findings outside established parameters.   Mr. Minkler will call for any new or worsened symptoms related to his CHF diagnosis.   I will see Mr. Reeser at home again next month for follow up on toenail and chronic health conditions.   Flowsheet Row Most Recent Value  Care Plan Problem Three  Acute Health Condition (ingrown toenail right great toe) with related pain management needs  Role Documenting the Problem Three  Care Management Coordinator  Care Plan for Problem Three  Active  [facility] Long Term Goal (31-90) days  Over the next 31 days, patient will verbalize understanding of plan of care for treatment of right great toe ingrown toenail  [facility] Long Term Goal Start Date  09/14/15  Interventions for Problem Three Long Term Goal  Collaboration with PCP office re: plan for toenail removal,  education provided to patient re: plan of care  [facility] CM Short Term Goal #1 (0-30 days)  over the next 48 hours, patient will see provider for toenail removal  [facility] CM Short Term Goal #1 Start Date  09/14/15  Interventions for Short Term Goal #1  assisted patient to make contact with urgent care recommended by PCP for toenail removal  [facility] CM Short Term Goal #2 (0-30 days)  Over the next 30 days, patient will report signs and symptoms of infection related to toenail removal  [facility] CM Short Term Goal #2 Start Date  09/14/15  Interventions for Short Term Goal #2  reviewed signs and symptoms of infection with patient  [facility] CM Short Term Goal #3 (0-30 days)  Over the next 30 days, patient will verbalize potential complications from skin condition related to diabetes diagnosis and importance of addressing any skin concerns immediately  [facility]  CM Short Term Goal #3  Start Date  09/14/15  Interventions for Short Term Goal #3  Discussed with patient increased risk for infections and other complications of skin conditions related to DM,  reinforced importance of early identification   of skin conditions and/or complications after procedures      [facility]  [phone]

## 2015-09-14 NOTE — Discharge Instructions (Signed)
You were seen today for evaluation and treatment of an ingrown toenail. You tolerated the procedure very well. I will give you a prescription for antibiotics (Keflex) to take for the next week to prevent infection. As we discussed please continue to do warm soaks with Epsom salts three times a day. Follow up with [name] later this week. Take the pain medicine you have at home as needed for pain. Return to the ER for new or worsening symptoms.

## 2015-09-20 ENCOUNTER — Encounter (HOSPITAL_COMMUNITY): Payer: Self-pay | Admitting: Emergency Medicine

## 2015-09-20 ENCOUNTER — Emergency Department (HOSPITAL_COMMUNITY)
Admission: EM | Admit: 2015-09-20 | Discharge: 2015-09-20 | Disposition: A | Discharge status: Home / Self Care | Payer: Medicare Other | Attending: Emergency Medicine | Admitting: Emergency Medicine

## 2015-09-20 ENCOUNTER — Emergency Department (HOSPITAL_COMMUNITY): Payer: Medicare Other

## 2015-09-20 DIAGNOSIS — Z955 Presence of coronary angioplasty implant and graft: Secondary | ICD-10-CM | POA: Diagnosis not present

## 2015-09-20 DIAGNOSIS — Z8546 Personal history of malignant neoplasm of prostate: Secondary | ICD-10-CM | POA: Insufficient documentation

## 2015-09-20 DIAGNOSIS — Z79899 Other long term (current) drug therapy: Secondary | ICD-10-CM | POA: Insufficient documentation

## 2015-09-20 DIAGNOSIS — I2510 Atherosclerotic heart disease of native coronary artery without angina pectoris: Secondary | ICD-10-CM | POA: Diagnosis not present

## 2015-09-20 DIAGNOSIS — I252 Old myocardial infarction: Secondary | ICD-10-CM | POA: Diagnosis not present

## 2015-09-20 DIAGNOSIS — Z950 Presence of cardiac pacemaker: Secondary | ICD-10-CM | POA: Insufficient documentation

## 2015-09-20 DIAGNOSIS — I5032 Chronic diastolic (congestive) heart failure: Secondary | ICD-10-CM | POA: Insufficient documentation

## 2015-09-20 DIAGNOSIS — Z87891 Personal history of nicotine dependence: Secondary | ICD-10-CM | POA: Insufficient documentation

## 2015-09-20 DIAGNOSIS — Z91040 Latex allergy status: Secondary | ICD-10-CM | POA: Diagnosis not present

## 2015-09-20 DIAGNOSIS — E1140 Type 2 diabetes mellitus with diabetic neuropathy, unspecified: Secondary | ICD-10-CM | POA: Diagnosis not present

## 2015-09-20 DIAGNOSIS — I110 Hypertensive heart disease with heart failure: Secondary | ICD-10-CM | POA: Insufficient documentation

## 2015-09-20 DIAGNOSIS — Z7901 Long term (current) use of anticoagulants: Secondary | ICD-10-CM | POA: Diagnosis not present

## 2015-09-20 DIAGNOSIS — R079 Chest pain, unspecified: Secondary | ICD-10-CM | POA: Diagnosis not present

## 2015-09-20 DIAGNOSIS — Z951 Presence of aortocoronary bypass graft: Secondary | ICD-10-CM | POA: Insufficient documentation

## 2015-09-20 DIAGNOSIS — R0789 Other chest pain: Secondary | ICD-10-CM | POA: Diagnosis not present

## 2015-09-20 DIAGNOSIS — Z7984 Long term (current) use of oral hypoglycemic drugs: Secondary | ICD-10-CM | POA: Diagnosis not present

## 2015-09-20 LAB — PROTIME-INR
INR: 2.47
PROTHROMBIN TIME: 27.2 s — AB (ref 11.4–15.2)

## 2015-09-20 LAB — I-STAT TROPONIN, ED: TROPONIN I, POC: 0.01 ng/mL (ref 0.00–0.08)

## 2015-09-20 LAB — BASIC METABOLIC PANEL
ANION GAP: 7 (ref 5–15)
BUN: 13 mg/dL (ref 6–20)
CALCIUM: 9.5 mg/dL (ref 8.9–10.3)
CHLORIDE: 107 mmol/L (ref 101–111)
CO2: 24 mmol/L (ref 22–32)
CREATININE: 0.87 mg/dL (ref 0.61–1.24)
GFR calc non Af Amer: >60 mL/min (ref >60)
Glucose, Bld: 73 mg/dL (ref 65–99)
Potassium: 4.7 mmol/L (ref 3.5–5.1)
SODIUM: 138 mmol/L (ref 135–145)

## 2015-09-20 LAB — CBC
HCT: 35 % — ABNORMAL LOW (ref 39.0–52.0)
Hemoglobin: 10.6 g/dL — ABNORMAL LOW (ref 13.0–17.0)
MCH: 27 pg (ref 26.0–34.0)
MCHC: 30.3 g/dL (ref 30.0–36.0)
MCV: 89.3 fL (ref 78.0–100.0)
PLATELETS: 148 10*3/uL — AB (ref 150–400)
RBC: 3.92 MIL/uL — AB (ref 4.22–5.81)
RDW: 15.8 % — ABNORMAL HIGH (ref 11.5–15.5)
WBC: 5.6 10*3/uL (ref 4.0–10.5)

## 2015-09-20 NOTE — ED Notes (Signed)
Bedside commode placed in pt's room 

## 2015-09-20 NOTE — ED Triage Notes (Signed)
Per [facility] patient complains of sudden onset of chest pain and pressure at approximately 1130 today.  Pressure on scene 96/50 per EMS, given 600 mL NS.  Pressure currently 130/81.  CBG 140. 18g IV saline locked in right AC.  Patient states he was told not to take aspirin due to some of the medicines he takes, EMS did not give nitroglycerin or aspirin.  Patient alert and oriented at this time.

## 2015-09-20 NOTE — ED Notes (Signed)
Gave pt [place] sandwich, applesauce and Ginger Ale, per Elmyra Ricks - RN.

## 2015-09-20 NOTE — ED Provider Notes (Signed)
[facility] DEPT Provider Note   CSN: [id number] Arrival date & time: 09/20/15  1242     History   Chief Complaint Chief Complaint  Patient presents with  . Chest Pain    HPI [name] is a 70 y.o. male.   He presents for evaluation of mid chest pain, associated with decreased ability to move his right arm, around noon today. Both symptoms have almost completely resolved. He denies cough, diaphoresis, shortness of breath, nausea, vomiting, weakness or dizziness. They believe breakfast and lunch today. No other recent problems. There are no other known modifying factors.   HPI  Past Medical History:  Diagnosis Date  . Aortic root dilatation ([place]) 02/02/2011  . Arthritis    "lower back; going back down both my sciatic nerves"  . 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 BENIGN PROSTATIC HYPERTROPHY 08/29/2006   takes Flomax daily  . 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 Cataracts, bilateral    immature  . CHF (congestive heart failure) (HCC)    takes Lasix daily  . Chronic back pain    HNP   . CORONARY ARTERY DISEASE 08/29/2006   takes Coumadin daily  . DEPRESSION 08/29/2006  . DIABETES MELLITUS, TYPE II 08/29/2006   takes Metformin,Januvia,and Glipizide  daily  . DIASTOLIC HEART FAILURE, CHRONIC 06/09/2008  . GOUT 04/22/2007   takes Allopurinol daily  . History of migraine    36yr ago  . HYPERLIPIDEMIA 08/29/2006   takes Atorvastatin daily  . HYPERTENSION 08/29/2006   takes Lisinopril daily  . INSOMNIA-SLEEP DISORDER-UNSPEC 10/23/2007  . Left lumbar radiculopathy 05/30/2010  . LUMBAR RADICULOPATHY, RIGHT 06/10/2007  . Muscle spasm    takes Zanaflex daily  . [place]   "I've had several MIs"  . 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 PACEMAKER, PERMANENT 03/04/2008   pt denies this date  . Peripheral neuropathy (HCC)    takes Gabapentin daily  . Presence of permanent cardiac pacemaker   . Shortness of breath  dyspnea    "all my life" with exertion  . Sleep apnea    "if I lay flat I quit breathing; HOB up I'm fine"    Patient Active Problem List   Diagnosis Date Noted  . Ingrown toenail 09/13/2015  . Renal insufficiency 09/08/2015  . Trochanteric bursitis of left hip 07/13/2015  . Right leg swelling 07/13/2015  . Leg wound, right 06/15/2015  . Rash 06/06/2015  . Anemia 05/24/2015  . Cellulitis of leg, right 05/13/2015  . Greater trochanteric bursitis of left hip 08/12/2014  . Left leg pain 07/15/2014  . 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 Lumbar stenosis with neurogenic claudication 03/23/2014  . Long-term (current) use of anticoagulants 02/27/2014  . Dysphagia, pharyngoesophageal phase 12/01/2013  . 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 Dysphagia 11/11/2013  . Lumbago 11/03/2013  . Low back pain radiating to right leg 11/03/2013  . Abnormality of gait 11/03/2013  . Difficulty walking 11/03/2013  . Loss of weight 10/29/2013  . [place]rial fibrillation ([place] 08/26/2013  . Chest pain 04/05/2013  . Plantar fasciitis of right foot 02/18/2013  . Bruit 08/21/2012  . Sciatica of left side 08/15/2012  . Left hip pain 08/15/2012  . Headache(784.0) 05/03/2012  . Increased prostate specific antigen (PSA) velocity 11/11/2011  . Right sided sciatica 11/10/2011  . Chronic LBP 05/03/2011  . A[place] dilatation ([place] 02/02/2011  . Wheezing without diagnosis of asthma 02/02/2011  . Syncope 12/27/2010  . Left  lumbar radiculopathy 05/30/2010  . Encounter for preventative adult health care exam with abnormal findings 05/30/2010  . 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 DIASTOLIC HEART FAILURE, CHRONIC 06/09/2008  . KNEE PAIN, BILATERAL 04/21/2008  . LEG PAIN, BILATERAL 04/21/2008  . 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 PACEMAKER, PERMANENT 03/04/2008  . INSOMNIA-SLEEP DISORDER-UNSPEC 10/23/2007  . Lumbar radiculopathy,  chronic 06/10/2007  . GOUT 04/22/2007  . 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 ERECTILE DYSFUNCTION 11/26/2006  . ALLERGIC RHINITIS 11/26/2006  . Type 2 diabetes, uncontrolled, with neuropathy ([place]) 08/29/2006  . Hyperlipidemia 08/29/2006  . Overweight(278.02) 08/29/2006  . DEPRESSION 08/29/2006  . Essential hypertension 08/29/2006  . CORONARY ARTERY DISEASE 08/29/2006  . BENIGN PROSTATIC HYPERTROPHY 08/29/2006    Past Surgical History:  Procedure Laterality Date  . COLONOSCOPY    . CORONARY ANGIOPLASTY WITH STENT PLACEMENT  12/27/10   "I've had a total of 9 cardiac stents put in"  . CORONARY ARTERY BYPASS GRAFT  1992   CABG X 2  . ESOPHAGOGASTRODUODENOSCOPY N/A 12/01/2013   Procedure: ESOPHAGOGASTRODUODENOSCOPY (EGD);  Surgeon: Lafayette Dragon, MD;  Location: [facility];  Service: Endoscopy;  Laterality: N/A;  . INSERT / REPLACE / REMOVE PACEMAKER  ~ 2004   initial pacemaker placement  . INSERT / REPLACE / REMOVE PACEMAKER  10/2009   generator change  . LUMBAR LAMINECTOMY/DECOMPRESSION MICRODISCECTOMY Right 03/23/2014   Procedure: LAMINECTOMY AND FORAMINOTOMY RIGHT LUMBAR THREE-FOUR,LUMBAR FOUR-FIVE, LUMBAR FIVE-SACRAL ONE;  Surgeon: Charlie Pitter, MD;  Location: [facility];  Service: Neurosurgery;  Laterality: Right;  right  . LUMBAR LAMINECTOMY/DECOMPRESSION MICRODISCECTOMY Left 12/01/2014   Procedure: Left Lumbar Three-Four, Lumbar Four-Five Laminectomy and Foraminotomy;  Surgeon: Earnie Larsson, MD;  Location: [facility];  Service: Neurosurgery;  Laterality: Left;  . s/p left arm surgury after work accident  1991   "2000# steel fell on it"  . s/p right hand surgury for foreign object  1970's   "piece of wood went in my hand; had to get that out"       Home Medications    Prior to Admission medications   Medication Sig Start Date End Date Taking? Authorizing Provider  allopurinol (ZYLOPRIM) 300 MG tablet take 1 tablet by mouth once daily Patient taking differently:  Take 300 mg by mouth daily.  03/03/14  Yes Biagio Borg, MD  atorvastatin (LIPITOR) 80 MG tablet Take 1 tablet by mouth  daily 06/25/15  Yes Biagio Borg, MD  Blood Glucose Monitoring Suppl (ONE TOUCH ULTRA 2) w/Device KIT Use as directed 06/25/15  Yes Biagio Borg, MD  carvedilol (COREG) 25 MG tablet Take 1 tablet by mouth two  times daily Patient taking differently: Take 25 mg by mouth two times a day 06/25/15  Yes Biagio Borg, MD  Cholecalciferol (VITAMIN D) 1000 UNITS capsule Take 1,000 Units by mouth 2 (two) times daily. Reported on 02/15/2015 07/31/13  Yes Historical Provider, MD  fluticasone [facility]) 50 MCG/ACT nasal spray Use 2 sprays in each  nostril every day 06/25/15  Yes Biagio Borg, MD  furosemide (LASIX) 80 MG tablet Take 1 tablet (80 mg total) by mouth 2 (two) times daily. 07/15/14  Yes Biagio Borg, MD  gabapentin (NEURONTIN) 300 MG capsule Take 2 capsules by mouth 3  times daily 06/25/15  Yes Biagio Borg, MD  glipiZIDE (GLUCOTROL XL) 10 MG 24 hr tablet Take 1 tablet by mouth  daily with breakfast 06/25/15  Yes Biagio Borg, MD  glucose  blood (ONE TOUCH ULTRA TEST) test strip Use to check blood sugars twice a day Dx E11.9 06/10/15  Yes Biagio Borg, MD  ibuprofen (ADVIL,MOTRIN) 200 MG tablet Take 800 mg by mouth daily as needed (pain).    Yes Historical Provider, MD  lisinopril (PRINIVIL,ZESTRIL) 2.5 MG tablet Take 1 tablet (2.5 mg total) by mouth daily. 03/17/15  Yes Biagio Borg, MD  metFORMIN (GLUCOPHAGE) 500 MG tablet Take 2 tablets by mouth two times daily 06/25/15  Yes Biagio Borg, MD  Multiple Vitamin (MULTIVITAMIN WITH MINERALS) TABS tablet Take 1 tablet by mouth every morning.   Yes Historical Provider, MD  Jonetta Speak LANCETS 23X MISC As directed 08/18/15  Yes Historical Provider, MD  potassium chloride SA (K-DUR,KLOR-CON) 20 MEQ tablet Take 1 tablet by mouth  every evening 06/25/15  Yes Biagio Borg, MD  sitaGLIPtin (JANUVIA) 100 MG tablet Take 1 tablet (100 mg total) by mouth  daily. Patient taking differently: Take 100 mg by mouth every evening.  05/13/15  Yes Biagio Borg, MD  tamsulosin [facility]) 0.4 MG CAPS capsule Take 1 capsule by mouth  every 12 hours 06/25/15  Yes Biagio Borg, MD  tiZANidine (ZANAFLEX) 4 MG tablet TAKE 1 TABLET BY MOUTH  EVERY 6 HOURS AS NEEDED FOR MUSCLE SPASMS. 06/25/15  Yes Biagio Borg, MD  traMADol (ULTRAM) 50 MG tablet Take 1 tablet (50 mg total) by mouth every 8 (eight) hours as needed. 07/13/15  Yes Biagio Borg, MD  triamcinolone cream (KENALOG) 0.1 % Apply 1 application topically 2 (two) times daily. 06/02/15  Yes Biagio Borg, MD  warfarin (COUMADIN) 5 MG tablet Take 1 tablet by mouth daily or as directed by coumadin clinic Patient taking differently: Take 5 mg by mouth See admin instructions. 5 mg in the evening on Sun/Mon/Tues/Wed/Fri/Sat and 2.5 mg on Thurs 03/30/15  Yes [name], MD  cephALEXin (KEFLEX) 500 MG capsule Take 1 capsule (500 mg total) by mouth 3 (three) times daily. Patient not taking: Reported on 09/20/2015 09/14/15   Anne Ng, PA-C    Family History Family History  Problem Relation Age of Onset  . Diabetes Mother   . Diabetes Sister   . Heart disease Sister     2 sister died with heart disease  . Coronary artery disease Other 49    male, first degree relative  . Diabetes Other     1st degree relative  . Heart disease Sister   . Lung cancer Sister     deceased    Social History Social History  Substance Use Topics  . Smoking status: Former Smoker    Packs/day: 3.00    Years: 9.00    Types: Cigarettes    Quit date: 02/26/1973  . Smokeless tobacco: Former Systems developer     Comment: quit smoking 32yr ago  . Alcohol use No     Allergies   Crestor [rosuvastatin calcium] and Latex   Review of Systems Review of Systems  All other systems reviewed and are negative.    Physical Exam Updated Vital Signs BP 121/56 (BP Location: Left Arm)   Pulse 65   Temp 97.9 F (36.6 C) (Oral)   Resp 20    SpO2 96%   Physical Exam  Constitutional: He is oriented to person, place, and time. He appears well-developed and well-nourished.  HENT:  Head: Normocephalic and atraumatic.  Right Ear: External ear normal.  Left Ear: External ear normal.  Eyes: Conjunctivae and  EOM are normal. Pupils are equal, round, and reactive to light.  Neck: Normal range of motion and phonation normal. Neck supple.  Cardiovascular: Normal rate, regular rhythm and normal heart sounds.   Pulmonary/Chest: Effort normal and breath sounds normal. He exhibits no bony tenderness.  Abdominal: Soft. There is no tenderness.  Musculoskeletal: Normal range of motion.  Neurological: He is alert and oriented to person, place, and time. No cranial nerve deficit or sensory deficit. He exhibits normal muscle tone. Coordination normal.  Skin: Skin is warm, dry and intact.  Psychiatric: He has a normal mood and affect. His behavior is normal. Judgment and thought content normal.  Nursing note and vitals reviewed.    ED Treatments / Results  Labs (all labs ordered are listed, but only abnormal results are displayed) Labs Reviewed  CBC - Abnormal; Notable for the following:       Result Value   RBC 3.92 (*)    Hemoglobin 10.6 (*)    HCT 35.0 (*)    RDW 15.8 (*)    Platelets 148 (*)    All other components within normal limits  PROTIME-INR - Abnormal; Notable for the following:    Prothrombin Time 27.2 (*)    All other components within normal limits  BASIC METABOLIC PANEL  I-STAT TROPOININ, ED     EKG Interpretation  Date/Time:  Monday September 20 2015 13:48:57 EDT Ventricular Rate:  61 PR Interval:    QRS Duration: 156 QT Interval:  443 QTC Calculation: 447 R Axis:   49 Text Interpretation:  Afib/flutter and ventricular-paced rhythm No further analysis attempted due to paced rhythm since tracing at 13:10- now atrial flutter waves and  V-pacing spikes are apparent, similar to prior Confirmed by [facility]  MD, [place]  [phone]) on 09/20/2015 4:23:20 PM         EKG  EKG Interpretation  Date/Time:  Monday September 20 2015 13:48:57 EDT Ventricular Rate:  61 PR Interval:    QRS Duration: 156 QT Interval:  443 QTC Calculation: 447 R Axis:   49 Text Interpretation:  Afib/flutter and ventricular-paced rhythm No further analysis attempted due to paced rhythm since tracing at 13:10- now atrial flutter waves and  V-pacing spikes are apparent, similar to prior Confirmed by Eulis Foster  MD, Garrick Midgley [phone]) on 09/20/2015 4:23:20 PM         Radiology Dg Chest 2 View  Result Date: 09/20/2015 CLINICAL DATA:  Weakness and chest pain EXAM: CHEST  2 VIEW COMPARISON:  PA and lateral chest x-ray of December 27, 2010 FINDINGS: The lungs are mildly hypoinflated. There is stable scarring at the left lung base. There is no alveolar infiltrate. The heart is top-normal in size. The pulmonary vascularity is not engorged. The permanent pacemaker is in stable position. The sternal wires are intact. There is calcification in the wall of the aortic arch. There is multilevel degenerative disc disease. IMPRESSION: Bilateral hypo inflation. Mild cardiomegaly, stable. No acute cardiopulmonary abnormality. Aortic atherosclerosis. Electronically Signed   By: David  Martinique M.D.   On: 09/20/2015 13:35    Procedures Procedures (including critical care time)  Medications Ordered in ED Medications - No data to display   Initial Impression / Assessment and Plan / ED Course  I have reviewed the triage vital signs and the nursing notes.  Pertinent labs & imaging results that were available during my care of the patient were reviewed by me and considered in my medical decision making (see chart for details).  Clinical Course  This patients CHA2DS2-VASc Score and unadjusted Ischemic Stroke Rate (% per year) is equal to 4.8 % stroke rate/year from a score of 4  Above score calculated as 1 point each if present [CHF, HTN, DM,  Vascular=MI/PAD/Aortic Plaque, Age if 65-74, or Male] Above score calculated as 2 points each if present [Age > 75, or Stroke/TIA/TE]    Medications - No data to display  Patient Vitals for the past 24 hrs:  BP Temp Temp src Pulse Resp SpO2  09/20/15 1559 121/56 - - 65 20 96 %  09/20/15 1344 123/77 97.9 F (36.6 C) Oral 71 16 96 %  09/20/15 1300 110/73 - - 60 17 94 %  09/20/15 1251 130/81 97.8 F (36.6 C) Oral 66 17 97 %    4:28 PM Reevaluation with update and discussion. After initial assessment and treatment, an updated evaluation reveals he is comfortable now, has eaten and wishes to go home. Findings discussed with patient, and all questions answered.Daleen Bo L    Final Clinical Impressions(s) / ED Diagnoses   Final diagnoses:  Nonspecific chest pain    Nonspecific chest discomfort, with reassuring evaluation. Doubt ACS, PE or pneumonia. Patient has chronic atrial fibrillation, currently anticoagulated, appropriately.   Nursing Notes Reviewed/ Care Coordinated Applicable Imaging Reviewed Interpretation of Laboratory Data incorporated into ED treatment  The patient appears reasonably screened and/or stabilized for discharge and I doubt any other medical condition or other [facility] requiring further screening, evaluation, or treatment in the ED at this time prior to discharge.  Plan: Home Medications- continue; Home Treatments- rest; return here if the recommended treatment, does not improve the symptoms; Recommended follow up- PCP prn     New Prescriptions New Prescriptions   No medications on file     Daleen Bo, MD 09/20/15 2129

## 2015-09-23 ENCOUNTER — Ambulatory Visit (INDEPENDENT_AMBULATORY_CARE_PROVIDER_SITE_OTHER): Payer: Medicare Other | Admitting: Pharmacist Clinician (PhC)/ Clinical Pharmacy Specialist

## 2015-09-23 ENCOUNTER — Ambulatory Visit (INDEPENDENT_AMBULATORY_CARE_PROVIDER_SITE_OTHER): Payer: Medicare Other | Admitting: Internal Medicine

## 2015-09-23 ENCOUNTER — Encounter: Payer: Self-pay | Admitting: Internal Medicine

## 2015-09-23 VITALS — BP 124/78 | HR 88 | Temp 98.0°F | Resp 20 | Wt 218.0 lb

## 2015-09-23 DIAGNOSIS — E1140 Type 2 diabetes mellitus with diabetic neuropathy, unspecified: Secondary | ICD-10-CM | POA: Diagnosis not present

## 2015-09-23 DIAGNOSIS — E1165 Type 2 diabetes mellitus with hyperglycemia: Secondary | ICD-10-CM | POA: Diagnosis not present

## 2015-09-23 DIAGNOSIS — I482 Chronic atrial fibrillation, unspecified: Secondary | ICD-10-CM

## 2015-09-23 DIAGNOSIS — E785 Hyperlipidemia, unspecified: Secondary | ICD-10-CM | POA: Diagnosis not present

## 2015-09-23 DIAGNOSIS — Z7901 Long term (current) use of anticoagulants: Secondary | ICD-10-CM | POA: Diagnosis not present

## 2015-09-23 DIAGNOSIS — I10 Essential (primary) hypertension: Secondary | ICD-10-CM

## 2015-09-23 DIAGNOSIS — I483 Typical atrial flutter: Secondary | ICD-10-CM | POA: Diagnosis not present

## 2015-09-23 DIAGNOSIS — IMO0002 Reserved for concepts with insufficient information to code with codable children: Secondary | ICD-10-CM

## 2015-09-23 LAB — POCT INR: INR: 3.8

## 2015-09-23 NOTE — Progress Notes (Signed)
Subjective:    Patient ID: [name], male    DOB: 27-Mar-1945, 70 y.o.   MRN: [id number]  HPI  Here to f/u; overall doing ok,  Pt denies chest pain, increasing sob or doe, wheezing, orthopnea, PND, increased LE swelling, palpitations, dizziness or syncope.  Pt denies new neurological symptoms such as new headache, or facial or extremity weakness or numbness.  Pt denies polydipsia, polyuria, or low sugar episode.   Pt denies new neurological symptoms such as new headache, or facial or extremity weakness or numbness.   Pt states overall good compliance with meds, mostly trying to follow appropriate diet, with wt overall stable,  but little exercise however. No further CP after seen at ED recently Past Medical History:  Diagnosis Date  . Aortic root dilatation ([place]) 02/02/2011  . Arthritis    "lower back; going back down both my sciatic nerves"  . 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 BENIGN PROSTATIC HYPERTROPHY 08/29/2006   takes Flomax daily  . 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 Cataracts, bilateral    immature  . CHF (congestive heart failure) (HCC)    takes Lasix daily  . Chronic back pain    HNP   . CORONARY ARTERY DISEASE 08/29/2006   takes Coumadin daily  . DEPRESSION 08/29/2006  . DIABETES MELLITUS, TYPE II 08/29/2006   takes Metformin,Januvia,and Glipizide  daily  . DIASTOLIC HEART FAILURE, CHRONIC 06/09/2008  . GOUT 04/22/2007   takes Allopurinol daily  . History of migraine    19yr ago  . HYPERLIPIDEMIA 08/29/2006   takes Atorvastatin daily  . HYPERTENSION 08/29/2006   takes Lisinopril daily  . INSOMNIA-SLEEP DISORDER-UNSPEC 10/23/2007  . Left lumbar radiculopathy 05/30/2010  . LUMBAR RADICULOPATHY, RIGHT 06/10/2007  . Muscle spasm    takes Zanaflex daily  . [place]   "I've had several MIs"  . 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 PACEMAKER, PERMANENT 03/04/2008   pt denies this date  . Peripheral neuropathy (HCC)      takes Gabapentin daily  . Presence of permanent cardiac pacemaker   . Shortness of breath dyspnea    "all my life" with exertion  . Sleep apnea    "if I lay flat I quit breathing; HOB up I'm fine"   Past Surgical History:  Procedure Laterality Date  . COLONOSCOPY    . CORONARY ANGIOPLASTY WITH STENT PLACEMENT  12/27/10   "I've had a total of 9 cardiac stents put in"  . CORONARY ARTERY BYPASS GRAFT  1992   CABG X 2  . ESOPHAGOGASTRODUODENOSCOPY N/A 12/01/2013   Procedure: ESOPHAGOGASTRODUODENOSCOPY (EGD);  Surgeon: DLafayette Dragon MD;  [facility];  Service: Endoscopy;  Laterality: N/A;  . INSERT / REPLACE / REMOVE PACEMAKER  ~ 2004   initial pacemaker placement  . INSERT / REPLACE / REMOVE PACEMAKER  10/2009   generator change  . LUMBAR LAMINECTOMY/DECOMPRESSION MICRODISCECTOMY Right 03/23/2014   Procedure: LAMINECTOMY AND FORAMINOTOMY RIGHT LUMBAR THREE-FOUR,LUMBAR FOUR-FIVE, LUMBAR FIVE-SACRAL ONE;  Surgeon: HCharlie Pitter MD;  [facility];  Service: Neurosurgery;  Laterality: Right;  right  . LUMBAR LAMINECTOMY/DECOMPRESSION MICRODISCECTOMY Left 12/01/2014   Procedure: Left Lumbar Three-Four, Lumbar Four-Five Laminectomy and Foraminotomy;  Surgeon: HEarnie Larsson MD;  [facility];  Service: Neurosurgery;  Laterality: Left;  . s/p left arm surgury after work accident  1991   "2000# steel fell on it"  . s/p right hand surgury for foreign object  1970's   "  piece of wood went in my hand; had to get that out"    reports that he quit smoking about 42 years ago. His smoking use included Cigarettes. He has a 27.00 pack-year smoking history. He has quit using smokeless tobacco. He reports that he does not drink alcohol or use drugs. family history includes Coronary artery disease (age of onset: 65) in his other; Diabetes in his mother, other, and sister; Heart disease in his sister and sister; Lung cancer in his sister. Allergies  Allergen Reactions  . Crestor  [Rosuvastatin Calcium] Other (See Comments)    Urination of blood  . Latex Rash and Other (See Comments)    Gloves cause welts on hands!!   Current Outpatient Prescriptions on File Prior to Visit  Medication Sig Dispense Refill  . allopurinol (ZYLOPRIM) 300 MG tablet take 1 tablet by mouth once daily (Patient taking differently: Take 300 mg by mouth daily. ) 90 tablet 3  . atorvastatin (LIPITOR) 80 MG tablet Take 1 tablet by mouth  daily 90 tablet 2  . Blood Glucose Monitoring Suppl (ONE TOUCH ULTRA 2) w/Device KIT Use as directed 1 each 0  . carvedilol (COREG) 25 MG tablet Take 1 tablet by mouth two  times daily (Patient taking differently: Take 25 mg by mouth two times a day) 180 tablet 2  . Cholecalciferol (VITAMIN D) 1000 UNITS capsule Take 1,000 Units by mouth 2 (two) times daily. Reported on 02/15/2015    . fluticasone (FLONASE) 50 MCG/ACT nasal spray Use 2 sprays in each  nostril every day 48 g 2  . furosemide (LASIX) 80 MG tablet Take 1 tablet (80 mg total) by mouth 2 (two) times daily. 60 tablet 5  . gabapentin (NEURONTIN) 300 MG capsule Take 2 capsules by mouth 3  times daily 540 capsule 2  . glipiZIDE (GLUCOTROL XL) 10 MG 24 hr tablet Take 1 tablet by mouth  daily with breakfast 90 tablet 2  . glucose blood (ONE TOUCH ULTRA TEST) test strip Use to check blood sugars twice a day Dx E11.9 200 each 2  . ibuprofen (ADVIL,MOTRIN) 200 MG tablet Take 800 mg by mouth daily as needed (pain).     Marland Kitchen lisinopril (PRINIVIL,ZESTRIL) 2.5 MG tablet Take 1 tablet (2.5 mg total) by mouth daily. 90 tablet 3  . metFORMIN (GLUCOPHAGE) 500 MG tablet Take 2 tablets by mouth two times daily 360 tablet 2  . Multiple Vitamin (MULTIVITAMIN WITH MINERALS) TABS tablet Take 1 tablet by mouth every morning.    Glory Rosebush DELICA LANCETS 03U MISC As directed    . potassium chloride SA (K-DUR,KLOR-CON) 20 MEQ tablet Take 1 tablet by mouth  every evening 90 tablet 2  . sitaGLIPtin (JANUVIA) 100 MG tablet Take 1 tablet  (100 mg total) by mouth daily. (Patient taking differently: Take 100 mg by mouth every evening. ) 90 tablet 3  . tamsulosin (FLOMAX) 0.4 MG CAPS capsule Take 1 capsule by mouth  every 12 hours 180 capsule 2  . tiZANidine (ZANAFLEX) 4 MG tablet TAKE 1 TABLET BY MOUTH  EVERY 6 HOURS AS NEEDED FOR MUSCLE SPASMS. 180 tablet 2  . traMADol (ULTRAM) 50 MG tablet Take 1 tablet (50 mg total) by mouth every 8 (eight) hours as needed. 90 tablet 1  . triamcinolone cream (KENALOG) 0.1 % Apply 1 application topically 2 (two) times daily. 30 g 0  . warfarin (COUMADIN) 5 MG tablet Take 1 tablet by mouth daily or as directed by coumadin clinic (Patient taking differently:  Take 5 mg by mouth See admin instructions. 5 mg in the evening on Sun/Mon/Tues/Wed/Fri/Sat and 2.5 mg on Thurs) 90 tablet 1   No current facility-administered medications on file prior to visit.    Review of Systems  Constitutional: Negative for unusual diaphoresis or night sweats HENT: Negative for ear swelling or discharge Eyes: Negative for worsening visual haziness  Respiratory: Negative for choking and stridor.   Gastrointestinal: Negative for distension or worsening eructation Genitourinary: Negative for retention or change in urine volume.  Musculoskeletal: Negative for other MSK pain or swelling Skin: Negative for color change and worsening wound Neurological: Negative for tremors and numbness other than noted  Psychiatric/Behavioral: Negative for decreased concentration or agitation other than above       Objective:   Physical Exam BP 124/78   Pulse 88   Temp 98 F (36.7 C) (Oral)   Resp 20   Wt 218 lb (98.9 kg)   SpO2 96%   BMI 30.40 kg/m  VS noted,  Constitutional: Pt appears in no apparent distress HENT: Head: NCAT.  Right Ear: External ear normal.  Left Ear: External ear normal.  Eyes: . Pupils are equal, round, and reactive to light. Conjunctivae and EOM are normal Neck: Normal range of motion. Neck supple.    Cardiovascular: Normal rate and regular rhythm.   Pulmonary/Chest: Effort normal and breath sounds without rales or wheezing.  Neurological: Pt is alert. Not confused , motor grossly intact Skin: Skin is warm. No rash, no LE edema Psychiatric: Pt behavior is normal. No agitation.     Assessment & Plan:

## 2015-09-23 NOTE — Patient Instructions (Signed)
Please continue all other medications as before, and refills have been done if requested.  Please have the pharmacy call with any other refills you may need.  Please continue your efforts at being more active, low cholesterol diet, and weight control.  You are otherwise up to date with prevention measures today.  Please keep your appointments with your specialists as you may have planned  Please go to the LAB in the Basement (turn left off the elevator) for the tests to be done as soon as you are able (remember you promised for next Monday)  You will be contacted by phone if any changes need to be made immediately.  Otherwise, you will receive a letter about your results with an explanation, but please check with MyChart first.  Please remember to sign up for MyChart if you have not done so, as this will be important to you in the future with finding out test results, communicating by private email, and scheduling acute appointments online when needed.

## 2015-09-23 NOTE — Assessment & Plan Note (Signed)
stable overall by history and exam, recent data reviewed with pt, and pt to continue medical treatment as before,  to f/u any worsening symptoms or concerns Lab Results  Component Value Date   HGBA1C 7.1 (H) 06/15/2015

## 2015-09-23 NOTE — Assessment & Plan Note (Signed)
stable overall by history and exam, recent data reviewed with pt, and pt to continue medical treatment as before,  to f/u any worsening symptoms or concerns BP Readings from Last 3 Encounters:  09/23/15 124/78  09/20/15 110/68  09/14/15 104/59

## 2015-09-23 NOTE — Assessment & Plan Note (Signed)
stable overall by history and exam, recent data reviewed with pt, and pt to continue medical treatment as before,  to f/u any worsening symptoms or concerns Lab Results  Component Value Date   LDLCALC 55 06/15/2015

## 2015-09-23 NOTE — Progress Notes (Signed)
Pre visit review using our clinic review tool, if applicable. No additional management support is needed unless otherwise documented below in the visit note. 

## 2015-10-07 ENCOUNTER — Ambulatory Visit (INDEPENDENT_AMBULATORY_CARE_PROVIDER_SITE_OTHER): Payer: Medicare Other | Admitting: Pharmacist Clinician (PhC)/ Clinical Pharmacy Specialist

## 2015-10-07 ENCOUNTER — Other Ambulatory Visit (INDEPENDENT_AMBULATORY_CARE_PROVIDER_SITE_OTHER): Payer: Medicare Other

## 2015-10-07 ENCOUNTER — Encounter: Payer: Self-pay | Admitting: Internal Medicine

## 2015-10-07 DIAGNOSIS — E1140 Type 2 diabetes mellitus with diabetic neuropathy, unspecified: Secondary | ICD-10-CM | POA: Diagnosis not present

## 2015-10-07 DIAGNOSIS — I482 Chronic atrial fibrillation, unspecified: Secondary | ICD-10-CM

## 2015-10-07 DIAGNOSIS — Z7901 Long term (current) use of anticoagulants: Secondary | ICD-10-CM | POA: Diagnosis not present

## 2015-10-07 DIAGNOSIS — I483 Typical atrial flutter: Secondary | ICD-10-CM

## 2015-10-07 DIAGNOSIS — IMO0002 Reserved for concepts with insufficient information to code with codable children: Secondary | ICD-10-CM

## 2015-10-07 DIAGNOSIS — E1165 Type 2 diabetes mellitus with hyperglycemia: Secondary | ICD-10-CM | POA: Diagnosis not present

## 2015-10-07 LAB — LIPID PANEL
CHOLESTEROL: 101 mg/dL (ref 0–200)
HDL: 41.3 mg/dL (ref >39.00)
LDL Cholesterol: 35 mg/dL (ref 0–99)
NonHDL: 59.68
TRIGLYCERIDES: 123 mg/dL (ref 0.0–149.0)
Total CHOL/HDL Ratio: 2
VLDL: 24.6 mg/dL (ref 0.0–40.0)

## 2015-10-07 LAB — CBC WITH DIFFERENTIAL/PLATELET
BASOS ABS: 0 10*3/uL (ref 0.0–0.1)
BASOS PCT: 0.2 % (ref 0.0–3.0)
EOS ABS: 0.1 10*3/uL (ref 0.0–0.7)
Eosinophils Relative: 2.2 % (ref 0.0–5.0)
HCT: 33.6 % — ABNORMAL LOW (ref 39.0–52.0)
Hemoglobin: 11.1 g/dL — ABNORMAL LOW (ref 13.0–17.0)
LYMPHS PCT: 29.7 % (ref 12.0–46.0)
Lymphs Abs: 1.8 10*3/uL (ref 0.7–4.0)
MCHC: 33.1 g/dL (ref 30.0–36.0)
MCV: 84.5 fl (ref 78.0–100.0)
MONO ABS: 0.6 10*3/uL (ref 0.1–1.0)
Monocytes Relative: 10.5 % (ref 3.0–12.0)
NEUTROS ABS: 3.4 10*3/uL (ref 1.4–7.7)
Neutrophils Relative %: 57.4 % (ref 43.0–77.0)
PLATELETS: 168 10*3/uL (ref 150.0–400.0)
RBC: 3.98 Mil/uL — ABNORMAL LOW (ref 4.22–5.81)
RDW: 17.7 % — AB (ref 11.5–15.5)
WBC: 5.9 10*3/uL (ref 4.0–10.5)

## 2015-10-07 LAB — BASIC METABOLIC PANEL
BUN: 16 mg/dL (ref 6–23)
CALCIUM: 8.8 mg/dL (ref 8.4–10.5)
CHLORIDE: 105 meq/L (ref 96–112)
CO2: 31 mEq/L (ref 19–32)
CREATININE: 0.94 mg/dL (ref 0.40–1.50)
GFR: 84.42 mL/min (ref >60.00)
Glucose, Bld: 145 mg/dL — ABNORMAL HIGH (ref 70–99)
Potassium: 4.4 mEq/L (ref 3.5–5.1)
Sodium: 141 mEq/L (ref 135–145)

## 2015-10-07 LAB — POCT INR: INR: 3.9

## 2015-10-07 LAB — HEMOGLOBIN A1C: HEMOGLOBIN A1C: 7 % — AB (ref 4.6–6.5)

## 2015-10-08 ENCOUNTER — Other Ambulatory Visit: Payer: Self-pay | Admitting: Cardiology

## 2015-10-09 ENCOUNTER — Other Ambulatory Visit: Payer: Self-pay | Admitting: Internal Medicine

## 2015-10-25 ENCOUNTER — Ambulatory Visit (INDEPENDENT_AMBULATORY_CARE_PROVIDER_SITE_OTHER): Payer: Medicare Other | Admitting: Pharmacist

## 2015-10-25 DIAGNOSIS — Z7901 Long term (current) use of anticoagulants: Secondary | ICD-10-CM | POA: Diagnosis not present

## 2015-10-25 DIAGNOSIS — I482 Chronic atrial fibrillation, unspecified: Secondary | ICD-10-CM

## 2015-10-25 DIAGNOSIS — I483 Typical atrial flutter: Secondary | ICD-10-CM

## 2015-10-25 LAB — POCT INR: INR: 3.5

## 2015-10-28 ENCOUNTER — Telehealth: Payer: Self-pay | Admitting: Internal Medicine

## 2015-10-28 MED ORDER — SITAGLIPTIN PHOSPHATE 100 MG PO TABS
100.0000 mg | ORAL_TABLET | Freq: Every day | ORAL | 3 refills | Status: DC
Start: 2015-10-28 — End: 2016-07-24

## 2015-10-28 NOTE — Telephone Encounter (Signed)
Pt request refill for sitaGLIPtin (JANUVIA) 100 MG tablet send into [facility]. Please call pt back once its done.

## 2015-10-28 NOTE — Telephone Encounter (Signed)
Medication refills sent to pharmacy 

## 2015-10-29 ENCOUNTER — Ambulatory Visit: Payer: Medicare Other | Admitting: Podiatry

## 2015-11-01 ENCOUNTER — Telehealth: Payer: Self-pay | Admitting: Internal Medicine

## 2015-11-01 NOTE — Telephone Encounter (Signed)
Pt called in and said that the sitaGLIPtin (JANUVIA) 100 MG tablet [id number]  Is going to cost him $300.00 plus now.  Is there anything else that can be called in that is more cost effective?

## 2015-11-02 NOTE — Telephone Encounter (Signed)
Called patient, unable to reach patient as well as leave a message. Please have patient call insurance company to see which ones are cheaper

## 2015-11-17 ENCOUNTER — Encounter: Payer: Self-pay | Admitting: *Deleted

## 2015-11-17 ENCOUNTER — Other Ambulatory Visit: Payer: Self-pay | Admitting: *Deleted

## 2015-11-17 NOTE — Patient Outreach (Signed)
[facility]) Care Management   11/17/2015  [name] 1945/07/26 [id number]  [name] is an 70 y.o. male who is followed by [name] for DM and HTN Disease Management and care coordination services..   [name] had back surgery on 12/02/14 and has experienced drastic improvement in mobility and pain management. He was hospitalized with lower extremity cellulitis during the spring and was followed at home for several weeks by [name]. His HHRN applied UNA Boot x 4 weeks and he finished a course of oral antibiotics. Since, [name] has presented to the ED once for a painful ingrown toenail which was excised and once for chest pain (no admit).   [name] has been unable to meet with me at home since August. We met today for assessment and care coordination related to his DM.   Subjective: "I think I'm doing pretty good all except for this place on my leg"   Objective:  BP 102/62 (BP Location: Right Arm, Patient Position: Sitting)   Pulse 74   SpO2 97%   Review of Systems  Constitutional: Negative.   HENT: Negative.   Eyes: Negative.   Respiratory: Negative for cough, hemoptysis, sputum production, shortness of breath and wheezing.   Cardiovascular: Negative for chest pain, palpitations and leg swelling.  Gastrointestinal: Negative.   Genitourinary: Negative.   Musculoskeletal: Negative.  Negative for falls.  Skin: Positive for rash.       Dry, scaly skin right ankle  Neurological: Negative.   Psychiatric/Behavioral: Negative.     Physical Exam  Constitutional: He is oriented to person, place, and time. Vital signs are normal. He appears well-developed and well-nourished. He is active. He does not have a sickly appearance. He does not appear ill.  Cardiovascular: Normal rate and regular rhythm.   Respiratory: Effort normal and breath sounds normal. He has no wheezes. He has no rhonchi. He has no rales.  GI: Soft. Bowel  sounds are normal.  Neurological: He is alert and oriented to person, place, and time.  Skin: Skin is warm and dry. Rash noted. Rash is urticarial.     Psychiatric: He has a normal mood and affect. His speech is normal and behavior is normal. Judgment and thought content normal. Cognition and memory are normal.    Encounter Medications:   Outpatient Encounter Prescriptions as of 11/17/2015  Medication Sig Note  . allopurinol (ZYLOPRIM) 300 MG tablet take 1 tablet by mouth once daily (Patient taking differently: Take 300 mg by mouth daily. )   . atorvastatin (LIPITOR) 80 MG tablet Take 1 tablet by mouth  daily   . Blood Glucose Monitoring Suppl (ONE TOUCH ULTRA 2) w/Device KIT Use as directed   . carvedilol (COREG) 25 MG tablet Take 1 tablet by mouth two  times daily (Patient taking differently: Take 25 mg by mouth two times a day)   . Cholecalciferol (VITAMIN D) 1000 UNITS capsule Take 1,000 Units by mouth 2 (two) times daily. Reported on 02/15/2015   . fluticasone (FLONASE) 50 MCG/ACT nasal spray Use 2 sprays in each  nostril every day 09/20/2015: Kept on hand and used as needed  . furosemide (LASIX) 40 MG tablet Take 1 tablet by mouth  daily   . furosemide (LASIX) 80 MG tablet Take 1 tablet (80 mg total) by mouth 2 (two) times daily. 05/24/2015: .  . gabapentin (NEURONTIN) 300 MG capsule Take 2 capsules by mouth 3  times daily   . glipiZIDE (  GLUCOTROL XL) 10 MG 24 hr tablet Take 1 tablet by mouth  daily with breakfast   . glucose blood (ONE TOUCH ULTRA TEST) test strip Use to check blood sugars twice a day Dx E11.9   . ibuprofen (ADVIL,MOTRIN) 200 MG tablet Take 800 mg by mouth daily as needed (pain).  09/20/2015: Kept on hand and used as needed  . lisinopril (PRINIVIL,ZESTRIL) 2.5 MG tablet Take 1 tablet (2.5 mg total) by mouth daily.   . metFORMIN (GLUCOPHAGE) 500 MG tablet Take 2 tablets by mouth two times daily   . Multiple Vitamin (MULTIVITAMIN WITH MINERALS) TABS tablet Take 1 tablet by  mouth every morning.   . ONETOUCH DELICA LANCETS 33G MISC As directed   . potassium chloride SA (K-DUR,KLOR-CON) 20 MEQ tablet Take 1 tablet by mouth  every evening   . sitaGLIPtin (JANUVIA) 100 MG tablet Take 1 tablet (100 mg total) by mouth daily.   . tamsulosin (FLOMAX) 0.4 MG CAPS capsule Take 1 capsule by mouth  every 12 hours   . tiZANidine (ZANAFLEX) 4 MG tablet TAKE 1 TABLET BY MOUTH  EVERY 6 HOURS AS NEEDED FOR MUSCLE SPASMS.   . traMADol (ULTRAM) 50 MG tablet Take 1 tablet (50 mg total) by mouth every 8 (eight) hours as needed.   . triamcinolone cream (KENALOG) 0.1 % Apply 1 application topically 2 (two) times daily.   . warfarin (COUMADIN) 5 MG tablet TAKE 1 TABLET BY MOUTH DAILY OR AS DIRECTED BY COUMADIN CLINIC    Assessment:   [name] is a 70 year old gentleman living in [place] with his wife who is wheelchair bound. He had back surgery on 12/03/14 and has had significant improvement in gait, pain management, and mobility. He was hospitalized in the spring with right lower extremity cellulitis and continued treatment as an outpatient with oral antibiotics and Una Boot. He has DM and has been managing fairly well with improved HgA1C.   Chronic Health Condition (DM)- [name] adherence to DM plan of care has improved over the last few months; he is managing his diet more closely and has been taking medications as prescribed; his HgA1C is 7.0 (from 8.1 a year ago).     Chronic Health Condition (CHF)- [name] has not had CHF exacerbation symptoms; he has no edema; he is taking oral diuretics as prescribed; he is weighing daily and recording, calling his provider for weight gain of 3# overnight or 5# in a week.   Plan:  I will update [name] providers via electronic delivery of this assessment.   [name] will report any new symptoms related to skin conditions.   [name] will check cbg's and weigh daily and record, notifying provider for findings  outside established parameters.   [name] will call for any new or worsened symptoms related to his CHF diagnosis.   I will see [name] at home again next month for follow up of chronic health conditions and we will likely transition to telephonic case management thereafter.      MHA,BSN,RN,CCM [facility]  [phone]       

## 2015-11-20 ENCOUNTER — Ambulatory Visit (INDEPENDENT_AMBULATORY_CARE_PROVIDER_SITE_OTHER): Payer: Medicare Other

## 2015-11-20 DIAGNOSIS — Z23 Encounter for immunization: Secondary | ICD-10-CM | POA: Diagnosis not present

## 2015-12-02 ENCOUNTER — Encounter: Payer: Self-pay | Admitting: Internal Medicine

## 2015-12-02 ENCOUNTER — Encounter: Payer: Medicare Other | Admitting: Internal Medicine

## 2015-12-03 ENCOUNTER — Encounter: Payer: Self-pay | Admitting: Internal Medicine

## 2015-12-03 ENCOUNTER — Ambulatory Visit (INDEPENDENT_AMBULATORY_CARE_PROVIDER_SITE_OTHER): Payer: Medicare Other | Admitting: Internal Medicine

## 2015-12-03 VITALS — BP 144/82 | HR 92 | Ht 71.0 in | Wt 213.0 lb

## 2015-12-03 DIAGNOSIS — I482 Chronic atrial fibrillation, unspecified: Secondary | ICD-10-CM

## 2015-12-03 DIAGNOSIS — I5032 Chronic diastolic (congestive) heart failure: Secondary | ICD-10-CM | POA: Diagnosis not present

## 2015-12-03 DIAGNOSIS — R350 Frequency of micturition: Secondary | ICD-10-CM

## 2015-12-03 NOTE — Progress Notes (Signed)
HPI [name] returns today for followup. He has a history of complete heart block, status post permanent pacemaker insertion, hypertension, and dyslipidemia, and coronary artery disease, status post multiple stents and status post bypass surgery. His main complaint today is related to urinary urgency, hesitancy, difficulty with his stream and sensation that he cannot go. He is not on any meds. He does not have a urologist. Allergies  Allergen Reactions  . Crestor [Rosuvastatin Calcium] Other (See Comments)    Urination of blood  . Latex Rash and Other (See Comments)    Gloves cause welts on hands!!     Current Outpatient Prescriptions  Medication Sig Dispense Refill  . allopurinol (ZYLOPRIM) 300 MG tablet take 1 tablet by mouth once daily (Patient taking differently: Take 300 mg by mouth daily. ) 90 tablet 3  . atorvastatin (LIPITOR) 80 MG tablet Take 1 tablet by mouth  daily 90 tablet 2  . Blood Glucose Monitoring Suppl (ONE TOUCH ULTRA 2) w/Device KIT Use as directed 1 each 3  . carvedilol (COREG) 25 MG tablet Take 1 tablet by mouth two  times daily (Patient taking differently: Take 25 mg by mouth two times a day) 180 tablet 2  . Cholecalciferol (VITAMIN D) 1000 UNITS capsule Take 1,000 Units by mouth 2 (two) times daily. Reported on 02/15/2015    . fluticasone (FLONASE) 50 MCG/ACT nasal spray Use 2 sprays in each  nostril every day 48 g 2  . furosemide (LASIX) 40 MG tablet Take 1 tablet by mouth  daily 90 tablet 3  . furosemide (LASIX) 80 MG tablet Take 1 tablet (80 mg total) by mouth 2 (two) times daily. 60 tablet 5  . gabapentin (NEURONTIN) 300 MG capsule Take 2 capsules by mouth 3  times daily 540 capsule 2  . glipiZIDE (GLUCOTROL XL) 10 MG 24 hr tablet Take 1 tablet by mouth  daily with breakfast 90 tablet 2  . glucose blood (ONE TOUCH ULTRA TEST) test strip Use to check blood sugars twice a day Dx E11.9 200 each 2  . ibuprofen (ADVIL,MOTRIN) 200 MG tablet Take 800 mg by mouth daily  as needed (pain).     Marland Kitchen lisinopril (PRINIVIL,ZESTRIL) 2.5 MG tablet Take 1 tablet (2.5 mg total) by mouth daily. 90 tablet 3  . metFORMIN (GLUCOPHAGE) 500 MG tablet Take 2 tablets by mouth two times daily 360 tablet 2  . Multiple Vitamin (MULTIVITAMIN WITH MINERALS) TABS tablet Take 1 tablet by mouth every morning.    Glory Rosebush DELICA LANCETS 36I MISC As directed    . potassium chloride SA (K-DUR,KLOR-CON) 20 MEQ tablet Take 1 tablet by mouth  every evening 90 tablet 2  . sitaGLIPtin (JANUVIA) 100 MG tablet Take 1 tablet (100 mg total) by mouth daily. 90 tablet 3  . tamsulosin (FLOMAX) 0.4 MG CAPS capsule Take 1 capsule by mouth  every 12 hours 180 capsule 2  . tiZANidine (ZANAFLEX) 4 MG tablet TAKE 1 TABLET BY MOUTH  EVERY 6 HOURS AS NEEDED FOR MUSCLE SPASMS. 180 tablet 3  . traMADol (ULTRAM) 50 MG tablet Take 1 tablet (50 mg total) by mouth every 8 (eight) hours as needed. 90 tablet 1  . triamcinolone cream (KENALOG) 0.1 % Apply 1 application topically 2 (two) times daily. 30 g 0  . warfarin (COUMADIN) 5 MG tablet TAKE 1 TABLET BY MOUTH DAILY OR AS DIRECTED BY COUMADIN CLINIC 90 tablet 1   No current facility-administered medications for this visit.      Past  Medical History:  Diagnosis Date  . Aortic root dilatation ([place]) 02/02/2011  . Arthritis    "lower back; going back down both my sciatic nerves"  . Atrioventricular block, complete ([place]) 09/04/2008  . BENIGN PROSTATIC HYPERTROPHY 08/29/2006   takes Flomax daily  . 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 Cataracts, bilateral    immature  . CHF (congestive heart failure) (HCC)    takes Lasix daily  . Chronic back pain    HNP   . CORONARY ARTERY DISEASE 08/29/2006   takes Coumadin daily  . DEPRESSION 08/29/2006  . DIABETES MELLITUS, TYPE II 08/29/2006   takes Metformin,Januvia,and Glipizide  daily  . DIASTOLIC HEART FAILURE, CHRONIC 06/09/2008  . GOUT 04/22/2007   takes Allopurinol daily  . History of migraine    10yr ago  .  HYPERLIPIDEMIA 08/29/2006   takes Atorvastatin daily  . HYPERTENSION 08/29/2006   takes Lisinopril daily  . INSOMNIA-SLEEP DISORDER-UNSPEC 10/23/2007  . Left lumbar radiculopathy 05/30/2010  . LUMBAR RADICULOPATHY, RIGHT 06/10/2007  . Muscle spasm    takes Zanaflex daily  . Myocardial infarction 12/27/10   "I've had several MIs"  . 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 PACEMAKER, PERMANENT 03/04/2008   pt denies this date  . Peripheral neuropathy ([facility]CC)    takes Gabapentin daily  . Presence of permanent cardiac pacemaker   . Shortness of breath dyspnea    "all my life" with exertion  . Sleep apnea    "if I lay flat I quit breathing; HOB up I'm fine"    ROS:   All systems reviewed and negative except as noted in the HPI.   Past Surgical History:  Procedure Laterality Date  . COLONOSCOPY    . CORONARY ANGIOPLASTY WITH STENT PLACEMENT  12/27/10   "I've had a total of 9 cardiac stents put in"  . CORONARY ARTERY BYPASS GRAFT  1992   CABG X 2  . ESOPHAGOGASTRODUODENOSCOPY N/A 12/01/2013   Procedure: ESOPHAGOGASTRODUODENOSCOPY (EGD);  Surgeon: DLafayette Dragon MD;  [facility];  Service: Endoscopy;  Laterality: N/A;  . INSERT / REPLACE / REMOVE PACEMAKER  ~ 2004   initial pacemaker placement  . INSERT / REPLACE / REMOVE PACEMAKER  10/2009   generator change  . LUMBAR LAMINECTOMY/DECOMPRESSION MICRODISCECTOMY Right 03/23/2014   Procedure: LAMINECTOMY AND FORAMINOTOMY RIGHT LUMBAR THREE-FOUR,LUMBAR FOUR-FIVE, LUMBAR FIVE-SACRAL ONE;  Surgeon: HCharlie Pitter MD;  [facility];  Service: Neurosurgery;  Laterality: Right;  right  . LUMBAR LAMINECTOMY/DECOMPRESSION MICRODISCECTOMY Left 12/01/2014   Procedure: Left Lumbar Three-Four, Lumbar Four-Five Laminectomy and Foraminotomy;  Surgeon: HEarnie Larsson MD;  [facility];  Service: Neurosurgery;  Laterality: Left;  . s/p left arm surgury after work accident  1991   "2000# steel fell on it"  . s/p right hand  surgury for foreign object  1970's   "piece of wood went in my hand; had to get that out"     Family History  Problem Relation Age of Onset  . Diabetes Mother   . Diabetes Sister   . Heart disease Sister     2 sister died with heart disease  . Coronary artery disease Other 576   male, first degree relative  . Diabetes Other     1st degree relative  . Heart disease Sister   . Lung cancer Sister     deceased     Social History   Social History  . Marital status: Married    Spouse name: N/A  .  Number of children: 2  . Years of education: N/A   Occupational History  . prior work Nature conservation officer  . disabled since 2004    Social History Main Topics  . Smoking status: Former Smoker    Packs/day: 3.00    Years: 9.00    Types: Cigarettes    Quit date: 02/26/1973  . Smokeless tobacco: Former Systems developer     Comment: quit smoking 30yr ago  . Alcohol use No  . Drug use: No     Comment: "used pouches of tobacco for a long time; quit those 12/09/1970"  . Sexual activity: Yes   Other Topics Concern  . Not on file   Social History Narrative  . No narrative on file     BP (!) 144/82   Pulse 92   Ht _0  (1.803 m)   Wt 213 lb (96.6 kg)   SpO2 95%   BMI 29.71 kg/m   Physical Exam:  Well appearing 70year old man, NAD HEENT: Unremarkable Neck:  6 cm JVD, no thyromegally Back:  No CVA tenderness Lungs:  Clear with no wheezes, rales, or rhonchi. HEART:  Regular rate rhythm, no murmurs, no rubs, no clicks Abd:  soft, positive bowel sounds, no organomegally, no rebound, no guarding Ext:  2 plus pulses, no edema, no cyanosis, no clubbing Skin:  No rashes no nodules Neuro:  CN II through XII intact, motor grossly intact   DEVICE  Normal device function.  See PaceArt for details.   Assess/Plan: 1. HTN - his blood pressure is up a bit. He is encouraged to reduce his salt intake and he will continue his coreg. 2. Urinary difficulties - I will refer to the  urology service 3. PPM - his medtronic DDD PM is working normally. Will recheck in several months.  GMikle BosworthD.

## 2015-12-03 NOTE — Patient Instructions (Signed)
Medication Instructions:  Your physician recommends that you continue on your current medications as directed. Please refer to the Current Medication list given to you today.   Labwork: None Ordered   Testing/Procedures: None Ordered   Follow-Up: Referral to Dr. Luberta Robertson  Your physician wants you to follow-up in: 6 months in [facility] and 1 year with Dr. Lovena Le. You will receive a reminder letter in the mail two months in advance. If you don't receive a letter, please call our office to schedule the follow-up appointment.   Any Other Special Instructions Will Be Listed Below (If Applicable).     If you need a refill on your cardiac medications before your next appointment, please call your pharmacy.

## 2015-12-14 ENCOUNTER — Encounter: Payer: Self-pay | Admitting: *Deleted

## 2015-12-14 ENCOUNTER — Other Ambulatory Visit: Payer: Self-pay | Admitting: *Deleted

## 2015-12-14 LAB — CUP PACEART INCLINIC DEVICE CHECK
Battery Impedance: 662 Ohm
Battery Voltage: 2.77 V
Brady Statistic RV Percent Paced: 100 %
Implantable Lead Implant Date: 20030730
Implantable Lead Location: 753859
Implantable Lead Model: 5076
Implantable Lead Model: 5076
Lead Channel Impedance Value: 478 Ohm
Lead Channel Sensing Intrinsic Amplitude: 11.2 mV
Lead Channel Setting Pacing Amplitude: 2.5 V
Lead Channel Setting Pacing Pulse Width: 0.4 ms
Lead Channel Setting Sensing Sensitivity: 2 mV
MDC IDC LEAD IMPLANT DT: 20030730
MDC IDC LEAD LOCATION: 753860
MDC IDC MSMT BATTERY REMAINING LONGEVITY: 79 mo
MDC IDC MSMT LEADCHNL RA IMPEDANCE VALUE: 67 Ohm
MDC IDC MSMT LEADCHNL RV PACING THRESHOLD AMPLITUDE: 0.5 V
MDC IDC MSMT LEADCHNL RV PACING THRESHOLD PULSEWIDTH: 0.4 ms
MDC IDC PG IMPLANT DT: 20111004
MDC IDC SESS DTM: 20171103201255

## 2015-12-14 NOTE — Patient Outreach (Signed)
[facility]) Care Management   12/14/2015  [name] September 12, 1945 [id number]  XENG KUCHER is an 70 y.o. male who is followed by [facility] for DM and HTN Disease Management and care coordinationservices..   [name] had back surgery on 12/02/14 and has experienced drastic improvement in mobility and pain management. He was hospitalized with lower extremity cellulitis during the spring and was followed at home for several weeks by [place]. His HHRN applied UNA Boot x 4 weeks and he finished a course of oral antibiotics. Since, [name] has presented to the ED once for a painful ingrown toenail which was excised and once for chest pain (no admit).   [name] was last seen by his cardiologist, Dr. Cristopher Peru, on 12/03/15 for follow up of h/o complete heart block s/p permanent pacemaker, HTN, dyslipidemia, CAD s/p multiple stents and CABG. [name] is followed in the [facility].   [name] was last seen in the office by his primary care provider, Dr. Cathlean Cower on 09/08/15.   Subjective: "I think I'm doing pretty good right now."  Objective:  BP 118/70   Pulse 66   Wt 210 lb (95.3 kg)   SpO2 95%   BMI 29.29 kg/m   Review of Systems  Constitutional: Negative.   HENT: Negative.   Eyes: Negative.   Respiratory: Negative for cough, sputum production, shortness of breath and wheezing.   Cardiovascular: Negative for chest pain, palpitations and leg swelling.       Chronic mild soft tissue swelling LLE  Gastrointestinal: Negative.   Genitourinary:       Patient reported GU symptoms to his cardiologist and primary care provider that he did not disclose to me today; he is scheduled for urology follow up  Musculoskeletal: Positive for myalgias. Negative for falls.  Skin: Negative.   Neurological: Negative for dizziness, tingling, tremors, focal weakness, loss of consciousness and headaches.   Psychiatric/Behavioral: Negative.     Physical Exam  Constitutional: He is oriented to person, place, and time. He appears well-developed and well-nourished. He is active. He does not have a sickly appearance. He does not appear ill.  Neck: No JVD present.  Cardiovascular: Normal rate, regular rhythm, S1 normal, S2 normal and normal heart sounds.   No murmur heard. Respiratory: Effort normal and breath sounds normal. He has no wheezes. He has no rhonchi. He has no rales.  GI: Soft. Bowel sounds are normal. He exhibits no distension. There is no tenderness.  Neurological: He is alert and oriented to person, place, and time.  Skin: Skin is warm, dry and intact. No rash noted.  Psychiatric: He has a normal mood and affect. His speech is normal and behavior is normal. Judgment and thought content normal. Cognition and memory are normal.    Encounter Medications:   Outpatient Encounter Prescriptions as of 12/14/2015  Medication Sig Note  . allopurinol (ZYLOPRIM) 300 MG tablet take 1 tablet by mouth once daily (Patient taking differently: Take 300 mg by mouth daily. )   . atorvastatin (LIPITOR) 80 MG tablet Take 1 tablet by mouth  daily   . Blood Glucose Monitoring Suppl (ONE TOUCH ULTRA 2) w/Device KIT Use as directed   . carvedilol (COREG) 25 MG tablet Take 1 tablet by mouth two  times daily (Patient taking differently: Take 25 mg by mouth two times a day)   . Cholecalciferol (VITAMIN D) 1000 UNITS capsule Take 1,000 Units by  mouth 2 (two) times daily. Reported on 02/15/2015   . fluticasone (FLONASE) 50 MCG/ACT nasal spray Use 2 sprays in each  nostril every day 09/20/2015: Kept on hand and used as needed  . furosemide (LASIX) 80 MG tablet Take 1 tablet (80 mg total) by mouth 2 (two) times daily. 05/24/2015: .  . gabapentin (NEURONTIN) 300 MG capsule Take 2 capsules by mouth 3  times daily   . glipiZIDE (GLUCOTROL XL) 10 MG 24 hr tablet Take 1 tablet by mouth  daily with breakfast   . glucose  blood (ONE TOUCH ULTRA TEST) test strip Use to check blood sugars twice a day Dx E11.9   . ibuprofen (ADVIL,MOTRIN) 200 MG tablet Take 800 mg by mouth daily as needed (pain).  09/20/2015: Kept on hand and used as needed  . lisinopril (PRINIVIL,ZESTRIL) 2.5 MG tablet Take 1 tablet (2.5 mg total) by mouth daily.   . metFORMIN (GLUCOPHAGE) 500 MG tablet Take 2 tablets by mouth two times daily   . Multiple Vitamin (MULTIVITAMIN WITH MINERALS) TABS tablet Take 1 tablet by mouth every morning.   Glory Rosebush DELICA LANCETS 39J MISC As directed   . potassium chloride SA (K-DUR,KLOR-CON) 20 MEQ tablet Take 1 tablet by mouth  every evening   . sitaGLIPtin (JANUVIA) 100 MG tablet Take 1 tablet (100 mg total) by mouth daily.   . tamsulosin (FLOMAX) 0.4 MG CAPS capsule Take 1 capsule by mouth  every 12 hours   . tiZANidine (ZANAFLEX) 4 MG tablet TAKE 1 TABLET BY MOUTH  EVERY 6 HOURS AS NEEDED FOR MUSCLE SPASMS.   Marland Kitchen traMADol (ULTRAM) 50 MG tablet Take 1 tablet (50 mg total) by mouth every 8 (eight) hours as needed.   . warfarin (COUMADIN) 5 MG tablet TAKE 1 TABLET BY MOUTH DAILY OR AS DIRECTED BY COUMADIN CLINIC   . furosemide (LASIX) 40 MG tablet Take 1 tablet by mouth  daily (Patient not taking: Reported on 12/14/2015)   . triamcinolone cream (KENALOG) 0.1 % Apply 1 application topically 2 (two) times daily. (Patient not taking: Reported on 12/14/2015)    No facility-administered encounter medications on file as of 12/14/2015.     Functional Status:   In your present state of health, do you have any difficulty performing the following activities: 12/14/2015 06/15/2015  Hearing? N N  Vision? N N  Difficulty concentrating or making decisions? N N  Walking or climbing stairs? Y Y  Dressing or bathing? N N  Doing errands, shopping? N N  Preparing Food and eating ? N N  Using the Toilet? N N  In the past six months, have you accidently leaked urine? N N  Do you have problems with loss of bowel control? N N   Managing your Medications? N N  Managing your Finances? N N  Housekeeping or managing your Housekeeping? N N  Some recent data might be hidden    Fall/Depression Screening:    PHQ 2/9 Scores 12/14/2015 08/05/2015 06/15/2015 03/22/2015 12/03/2014 10/15/2014 09/16/2014  PHQ - 2 Score 0 0 1 0 0 0 0    Assessment:  [name] is a 70 year old gentleman living in [place] with his wife who is wheelchair bound. He had back surgery on 12/03/14 and has had significant improvement in gait, pain management, and mobility. He was hospitalized in the spring with right lower extremity cellulitis and continued treatment as an outpatient with oral antibiotics and Christoper Allegra. He has DM and has been managing fairly well with  improved HgA1C.   Chronic Health Condition (DM)- [name] has continued to work diligently on adherence to his prescribed plan of care for management of DM, including medication adherence, ongoing work on adherence to his prescribed carb modified diet, and recommended exercise plan; as a result,  his HgA1C has decreased to  7.0 (from 8.1 a year ago).    CBG Averages:  Fasting (today) = 123 7 day average (n=14) = 147 14 day average (n=29) = 150 30 day average (n=69) = 163   Chronic Health Condition (CHF)- [name] has not had CHF exacerbation symptoms; he has no edema but does have chronic/unchanged mild soft tissue swelling of the RLE; he is taking oral diuretics as prescribed; he is weighing daily and recording, calling his provider for weight gain of 3# overnight or 5# in a week; his weight has been between 210lb and 215lb for > 6 months   Plan:  I will update [name] providers via electronic delivery of this assessment.   [name] will report any new symptoms to his provider (s).   [name] will check cbg's and weigh daily and record, notifying provider for findings outside established parameters.   I will see [name] at home again next month for  follow up of chronic health conditions and if he has no new problems, he agrees to transition to telephonic case management thereafter.    [facility]  [phone]

## 2015-12-18 ENCOUNTER — Other Ambulatory Visit: Payer: Self-pay | Admitting: Internal Medicine

## 2016-01-03 ENCOUNTER — Ambulatory Visit (INDEPENDENT_AMBULATORY_CARE_PROVIDER_SITE_OTHER): Payer: Medicare Other | Admitting: Pharmacist Clinician (PhC)/ Clinical Pharmacy Specialist

## 2016-01-03 DIAGNOSIS — Z7901 Long term (current) use of anticoagulants: Secondary | ICD-10-CM

## 2016-01-03 DIAGNOSIS — I482 Chronic atrial fibrillation, unspecified: Secondary | ICD-10-CM

## 2016-01-03 DIAGNOSIS — I483 Typical atrial flutter: Secondary | ICD-10-CM | POA: Diagnosis not present

## 2016-01-03 LAB — POCT INR: INR: 3.1

## 2016-01-04 ENCOUNTER — Other Ambulatory Visit: Payer: Self-pay | Admitting: Internal Medicine

## 2016-01-12 ENCOUNTER — Other Ambulatory Visit: Payer: Self-pay | Admitting: *Deleted

## 2016-01-12 NOTE — Patient Outreach (Signed)
[facility]) Care Management   01/12/2016  COEN MIYASATO 07/23/1945 [id number]  OLYVER HAWES is an 70 y.o. male who is followed by [facility] for DM and HTN Disease Management and care coordinationservices..   Mr. Gondek had back surgery on 12/02/14 and has experienced drastic improvement in mobility and pain management. He was hospitalized withlower extremity cellulitis during the spring and was followed at home for several weeks by [facility].Since, Mr. Towell has presented to the ED once for a painful ingrown toenail which was excised and once for chest pain (no admit).   Mr. Umholtz was last seen by his cardiologist, Dr. Cristopher Peru, on 12/03/15 for follow up of h/o complete heart block s/p permanent pacemaker, HTN, dyslipidemia, CAD s/p multiple stents and CABG. Mr. Sheerin is followed in the [facility] [place] [facility].   Mr. Palmeri was last seen in the office by his primary care provider, Dr. Cathlean Cower on 09/08/15.   Subjective: "I think I'm doing real good right now. I don't have the money to go to the urologist but I'm saving it up and I'll have to go the first of the year."   Objective:  BP 128/80   Pulse 83   Wt 210 lb (95.3 kg)   SpO2 98%   BMI 29.29 kg/m   Review of Systems  Constitutional: Negative.   HENT: Negative.   Eyes: Negative.   Respiratory: Negative for cough, sputum production, shortness of breath and wheezing.   Cardiovascular: Positive for leg swelling. Negative for chest pain and palpitations.       Mild soft tissue swelling right lower extremity consistent with baseline  Gastrointestinal: Negative.   Genitourinary: Negative.   Musculoskeletal: Negative.  Negative for falls.  Skin: Negative.   Neurological: Negative.   Psychiatric/Behavioral: Negative.     Physical Exam  Constitutional: He is oriented to person, place, and time. Vital signs are normal. He appears well-developed and  well-nourished. He is active. He does not have a sickly appearance. He does not appear ill.  Cardiovascular: Normal rate and regular rhythm.   Respiratory: Effort normal and breath sounds normal. He has no wheezes. He has no rhonchi. He has no rales.  GI: Soft. Bowel sounds are normal.  Neurological: He is alert and oriented to person, place, and time.  Skin: Skin is warm, dry and intact.  Psychiatric: He has a normal mood and affect. His speech is normal and behavior is normal. Judgment and thought content normal. Cognition and memory are normal.    Encounter Medications:   Outpatient Encounter Prescriptions as of 01/12/2016  Medication Sig Note  . allopurinol (ZYLOPRIM) 300 MG tablet take 1 tablet by mouth once daily (Patient taking differently: Take 300 mg by mouth daily. )   . atorvastatin (LIPITOR) 80 MG tablet TAKE 1 TABLET BY MOUTH  DAILY   . Blood Glucose Monitoring Suppl (ONE TOUCH ULTRA 2) w/Device KIT Use as directed   . carvedilol (COREG) 25 MG tablet TAKE 1 TABLET BY MOUTH TWO  TIMES DAILY   . Cholecalciferol (VITAMIN D) 1000 UNITS capsule Take 1,000 Units by mouth 2 (two) times daily. Reported on 02/15/2015   . fluticasone (FLONASE) 50 MCG/ACT nasal spray Use 2 sprays in each  nostril every day 09/20/2015: Kept on hand and used as needed  . furosemide (LASIX) 40 MG tablet Take 1 tablet by mouth  daily (Patient not taking: Reported on 12/14/2015)   . furosemide (LASIX) 80 MG  tablet Take 1 tablet (80 mg total) by mouth 2 (two) times daily. 05/24/2015: .  . gabapentin (NEURONTIN) 300 MG capsule TAKE 2 CAPSULES BY MOUTH 3  TIMES DAILY   . glipiZIDE (GLUCOTROL XL) 10 MG 24 hr tablet TAKE 1 TABLET BY MOUTH  DAILY WITH BREAKFAST   . ibuprofen (ADVIL,MOTRIN) 200 MG tablet Take 800 mg by mouth daily as needed (pain).  09/20/2015: Kept on hand and used as needed  . lisinopril (PRINIVIL,ZESTRIL) 2.5 MG tablet TAKE 1 TABLET BY MOUTH  DAILY   . metFORMIN (GLUCOPHAGE) 500 MG tablet TAKE 2 TABLETS  BY MOUTH TWO TIMES DAILY   . Multiple Vitamin (MULTIVITAMIN WITH MINERALS) TABS tablet Take 1 tablet by mouth every morning.   . ONE TOUCH ULTRA TEST test strip CHECK BLOOD SUGAR TWO TIMES DAILY   . ONETOUCH DELICA LANCETS 16W MISC As directed   . ONETOUCH DELICA LANCETS 73X MISC USE AS DIRECTED THREE TIMES DAILY TO CHECK BLOOD SUGAR.   Marland Kitchen potassium chloride SA (K-DUR,KLOR-CON) 20 MEQ tablet TAKE 1 TABLET BY MOUTH  EVERY EVENING   . sitaGLIPtin (JANUVIA) 100 MG tablet Take 1 tablet (100 mg total) by mouth daily.   . tamsulosin (FLOMAX) 0.4 MG CAPS capsule TAKE 1 CAPSULE BY MOUTH  EVERY 12 HOURS   . tiZANidine (ZANAFLEX) 4 MG tablet TAKE 1 TABLET BY MOUTH  EVERY 6 HOURS AS NEEDED FOR MUSCLE SPASMS.   Marland Kitchen traMADol (ULTRAM) 50 MG tablet Take 1 tablet (50 mg total) by mouth every 8 (eight) hours as needed.   . triamcinolone cream (KENALOG) 0.1 % Apply 1 application topically 2 (two) times daily. (Patient not taking: Reported on 12/14/2015)   . warfarin (COUMADIN) 5 MG tablet TAKE 1 TABLET BY MOUTH DAILY OR AS DIRECTED BY COUMADIN CLINIC    No facility-administered encounter medications on file as of 01/12/2016.     Assessment:   Mr. Banko is a 70 year old gentleman living in [place] with his wife who is wheelchair bound. He had back surgery on 12/03/14 and has had significant improvement in gait, pain management, and mobility. He was hospitalized in the spring with right lower extremity cellulitis and continued treatment as an outpatient with oral antibiotics and Christoper Allegra. He has DM and has been managing fairly well with improved HgA1C.   Chronic Health Condition (DM)- Mr. Schaible has continued to work diligently on adherence to his prescribed plan of care for management of DM, including medication adherence, ongoing work on adherence to his prescribed carb modified diet, and recommended exercise plan; as a result,  his HgA1C has decreased to  7.0 (from 8.1 a year ago).   CBG Averages:   Fasting (today) = 123 7 day average (n=14) = 147 14 day average (n=29) = 150 30 day average (n=69) = 163   Chronic Health Condition (CHF)- Mr. Reesman has not had CHF exacerbation symptoms; he has no edema but does have chronic/unchanged mild soft tissue swelling of the RLE; he is taking oral diuretics as prescribed; he is weighing daily and recording, calling his provider for weight gain of 3# overnight or 5# in a week; his weight has been between 210lb and 215lb for > 6 months   Plan:  I will update Mr. Albaugh providers via electronic delivery of this assessment.   Mr. Niazi will report any new symptoms to his provider (s).   Mr. Lafitte will check cbg's and weigh daily and record, notifying provider for findings outside established parameters.  I will see Mr. Gumina at home again next month for follow up ofchronic health conditions and if he has no new problems, he agrees to transition to telephonic case management thereafter.    [facility]  [phone]

## 2016-01-19 ENCOUNTER — Ambulatory Visit (INDEPENDENT_AMBULATORY_CARE_PROVIDER_SITE_OTHER): Payer: Medicare Other | Admitting: Internal Medicine

## 2016-01-19 ENCOUNTER — Encounter: Payer: Self-pay | Admitting: Internal Medicine

## 2016-01-19 VITALS — BP 138/78 | HR 76 | Temp 98.4°F | Resp 20 | Wt 218.0 lb

## 2016-01-19 DIAGNOSIS — E538 Deficiency of other specified B group vitamins: Secondary | ICD-10-CM

## 2016-01-19 DIAGNOSIS — R413 Other amnesia: Secondary | ICD-10-CM | POA: Insufficient documentation

## 2016-01-19 DIAGNOSIS — IMO0002 Reserved for concepts with insufficient information to code with codable children: Secondary | ICD-10-CM

## 2016-01-19 DIAGNOSIS — I10 Essential (primary) hypertension: Secondary | ICD-10-CM

## 2016-01-19 DIAGNOSIS — R21 Rash and other nonspecific skin eruption: Secondary | ICD-10-CM

## 2016-01-19 DIAGNOSIS — E1140 Type 2 diabetes mellitus with diabetic neuropathy, unspecified: Secondary | ICD-10-CM

## 2016-01-19 DIAGNOSIS — E1165 Type 2 diabetes mellitus with hyperglycemia: Secondary | ICD-10-CM

## 2016-01-19 MED ORDER — DONEPEZIL HCL 5 MG PO TABS: 5.0000 mg | ORAL_TABLET | Freq: Every day | ORAL | 3 refills | Status: DC

## 2016-01-19 MED ORDER — FLUOCINONIDE 0.05 % EX CREA: 1.0000 "application " | TOPICAL_CREAM | Freq: Two times a day (BID) | CUTANEOUS | 2 refills | Status: DC

## 2016-01-19 NOTE — Patient Instructions (Signed)
Please take all new medication as prescribed - the cream for the leg, and the aricept for memory  Please call if the right leg is not improved, for dermatology referral  Please continue all other medications as before, and refills have been done if requested.  Please have the pharmacy call with any other refills you may need.  Please keep your appointments with your specialists as you may have planned

## 2016-01-19 NOTE — Progress Notes (Signed)
Pre visit review using our clinic review tool, if applicable. No additional management support is needed unless otherwise documented below in the visit note. 

## 2016-01-20 ENCOUNTER — Telehealth: Payer: Self-pay | Admitting: Internal Medicine

## 2016-01-20 NOTE — Telephone Encounter (Signed)
Called patient unable to reach left message to give us a call back.

## 2016-01-20 NOTE — Telephone Encounter (Signed)
Patient called to advise that fluocinonide cream (LIDEX) 0.05 % [id number]   costs too much. He is asking [place] to send in an alternative prescription. He states that they did not give him a cheaper alternative on his formulary

## 2016-01-20 NOTE — Telephone Encounter (Signed)
I dont really have a good alternative, though I can refer to dermatology if he wants

## 2016-01-22 NOTE — Assessment & Plan Note (Signed)
?   Early dementia, pt declines lab or MRI or neuro evaluation, for aricept 5 qd, cont to follow

## 2016-01-22 NOTE — Assessment & Plan Note (Signed)
stable overall by history and exam, recent data reviewed with pt, and pt to continue medical treatment as before,  to f/u any worsening symptoms or concerns Lab Results  Component Value Date   HGBA1C 7.0 (H) 10/07/2015

## 2016-01-22 NOTE — Assessment & Plan Note (Signed)
stable overall by history and exam, recent data reviewed with pt, and pt to continue medical treatment as before,  to f/u any worsening symptoms or concerns BP Readings from Last 3 Encounters:  01/19/16 138/78  01/12/16 128/80  12/14/15 118/70

## 2016-01-22 NOTE — Progress Notes (Signed)
Subjective:    Patient ID: [name], male    DOB: August 13, 1945, 70 y.o.   MRN: [id number]  HPI  Here to f/u, c/o distal RLE erythema with slight discomfort but more itchy than painful, with some excoriations from scratching, but no worsening swelling, red streaks or fever, chills.  Denies worsening depressive symptoms, suicidal ideation, or panic; but has had noticeable worsening memory dysfxn over the last 3 mo, most noted by having to backtrack on doing things b/c he cant remember what he intended to do, now most days of the week.  Pt denies new neurological symptoms such as new headache, or facial or extremity weakness or numbness.  Pt denies polydipsia, polyuria.   Past Medical History:  Diagnosis Date  . Aortic root dilatation ([place]) 02/02/2011  . Arthritis    "lower back; going back down both my sciatic nerves"  . Atrioventricular block, complete ([place]) 09/04/2008  . BENIGN PROSTATIC HYPERTROPHY 08/29/2006   takes Flomax daily  . 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 Cataracts, bilateral    immature  . CHF (congestive heart failure) (HCC)    takes Lasix daily  . Chronic back pain    HNP   . CORONARY ARTERY DISEASE 08/29/2006   takes Coumadin daily  . DEPRESSION 08/29/2006  . DIABETES MELLITUS, TYPE II 08/29/2006   takes Metformin,Januvia,and Glipizide  daily  . DIASTOLIC HEART FAILURE, CHRONIC 06/09/2008  . GOUT 04/22/2007   takes Allopurinol daily  . History of migraine    59yr ago  . HYPERLIPIDEMIA 08/29/2006   takes Atorvastatin daily  . HYPERTENSION 08/29/2006   takes Lisinopril daily  . INSOMNIA-SLEEP DISORDER-UNSPEC 10/23/2007  . Left lumbar radiculopathy 05/30/2010  . LUMBAR RADICULOPATHY, RIGHT 06/10/2007  . Muscle spasm    takes Zanaflex daily  . Myocardial infarction 12/27/10   "I've had several MIs"  . 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 PACEMAKER, PERMANENT 03/04/2008   pt denies this date  . Peripheral neuropathy (HCC)    takes Gabapentin daily  .  Presence of permanent cardiac pacemaker   . Shortness of breath dyspnea    "all my life" with exertion  . Sleep apnea    "if I lay flat I quit breathing; HOB up I'm fine"   Past Surgical History:  Procedure Laterality Date  . COLONOSCOPY    . CORONARY ANGIOPLASTY WITH STENT PLACEMENT  12/27/10   "I've had a total of 9 cardiac stents put in"  . CORONARY ARTERY BYPASS GRAFT  1992   CABG X 2  . ESOPHAGOGASTRODUODENOSCOPY N/A 12/01/2013   Procedure: ESOPHAGOGASTRODUODENOSCOPY (EGD);  Surgeon: DLafayette Dragon MD;  [facility];  Service: Endoscopy;  Laterality: N/A;  . INSERT / REPLACE / REMOVE PACEMAKER  ~ 2004   initial pacemaker placement  . INSERT / REPLACE / REMOVE PACEMAKER  10/2009   generator change  . LUMBAR LAMINECTOMY/DECOMPRESSION MICRODISCECTOMY Right 03/23/2014   Procedure: LAMINECTOMY AND FORAMINOTOMY RIGHT LUMBAR THREE-FOUR,LUMBAR FOUR-FIVE, LUMBAR FIVE-SACRAL ONE;  Surgeon: HCharlie Pitter MD;  [facility];  Service: Neurosurgery;  Laterality: Right;  right  . LUMBAR LAMINECTOMY/DECOMPRESSION MICRODISCECTOMY Left 12/01/2014   Procedure: Left Lumbar Three-Four, Lumbar Four-Five Laminectomy and Foraminotomy;  Surgeon: HEarnie Larsson MD;  [facility];  Service: Neurosurgery;  Laterality: Left;  . s/p left arm surgury after work accident  1991   "2000# steel fell on it"  . s/p right hand surgury for foreign object  1970's   "piece of wood went in  my hand; had to get that out"    reports that he quit smoking about 42 years ago. His smoking use included Cigarettes. He has a 27.00 pack-year smoking history. He has quit using smokeless tobacco. He reports that he does not drink alcohol or use drugs. family history includes Coronary artery disease (age of onset: 72) in his other; Diabetes in his mother, other, and sister; Heart disease in his sister and sister; Lung cancer in his sister. Allergies  Allergen Reactions  . Crestor [Rosuvastatin Calcium] Other  (See Comments)    Urination of blood  . Latex Rash and Other (See Comments)    Gloves cause welts on hands!!   Current Outpatient Prescriptions on File Prior to Visit  Medication Sig Dispense Refill  . allopurinol (ZYLOPRIM) 300 MG tablet take 1 tablet by mouth once daily (Patient taking differently: Take 300 mg by mouth daily. ) 90 tablet 3  . atorvastatin (LIPITOR) 80 MG tablet TAKE 1 TABLET BY MOUTH  DAILY 90 tablet 0  . Blood Glucose Monitoring Suppl (ONE TOUCH ULTRA 2) w/Device KIT Use as directed 1 each 3  . carvedilol (COREG) 25 MG tablet TAKE 1 TABLET BY MOUTH TWO  TIMES DAILY 180 tablet 0  . Cholecalciferol (VITAMIN D) 1000 UNITS capsule Take 1,000 Units by mouth 2 (two) times daily. Reported on 02/15/2015    . fluticasone (FLONASE) 50 MCG/ACT nasal spray Use 2 sprays in each  nostril every day 48 g 2  . furosemide (LASIX) 40 MG tablet Take 1 tablet by mouth  daily 90 tablet 3  . furosemide (LASIX) 80 MG tablet Take 1 tablet (80 mg total) by mouth 2 (two) times daily. 60 tablet 5  . gabapentin (NEURONTIN) 300 MG capsule TAKE 2 CAPSULES BY MOUTH 3  TIMES DAILY 540 capsule 0  . glipiZIDE (GLUCOTROL XL) 10 MG 24 hr tablet TAKE 1 TABLET BY MOUTH  DAILY WITH BREAKFAST 90 tablet 0  . ibuprofen (ADVIL,MOTRIN) 200 MG tablet Take 800 mg by mouth daily as needed (pain).     Marland Kitchen lisinopril (PRINIVIL,ZESTRIL) 2.5 MG tablet TAKE 1 TABLET BY MOUTH  DAILY 90 tablet 0  . metFORMIN (GLUCOPHAGE) 500 MG tablet TAKE 2 TABLETS BY MOUTH TWO TIMES DAILY 360 tablet 0  . Multiple Vitamin (MULTIVITAMIN WITH MINERALS) TABS tablet Take 1 tablet by mouth every morning.    . ONE TOUCH ULTRA TEST test strip CHECK BLOOD SUGAR TWO TIMES DAILY 200 each 0  . ONETOUCH DELICA LANCETS 88F MISC As directed    . ONETOUCH DELICA LANCETS 02D MISC USE AS DIRECTED THREE TIMES DAILY TO CHECK BLOOD SUGAR. 300 each 0  . potassium chloride SA (K-DUR,KLOR-CON) 20 MEQ tablet TAKE 1 TABLET BY MOUTH  EVERY EVENING 90 tablet 0  .  sitaGLIPtin (JANUVIA) 100 MG tablet Take 1 tablet (100 mg total) by mouth daily. 90 tablet 3  . tamsulosin (FLOMAX) 0.4 MG CAPS capsule TAKE 1 CAPSULE BY MOUTH  EVERY 12 HOURS 180 capsule 0  . tiZANidine (ZANAFLEX) 4 MG tablet TAKE 1 TABLET BY MOUTH  EVERY 6 HOURS AS NEEDED FOR MUSCLE SPASMS. 180 tablet 3  . traMADol (ULTRAM) 50 MG tablet Take 1 tablet (50 mg total) by mouth every 8 (eight) hours as needed. 90 tablet 1  . warfarin (COUMADIN) 5 MG tablet TAKE 1 TABLET BY MOUTH DAILY OR AS DIRECTED BY COUMADIN CLINIC 90 tablet 1   No current facility-administered medications on file prior to visit.    Review of Systems  Constitutional: Negative for unusual diaphoresis or night sweats HENT: Negative for ear swelling or discharge Eyes: Negative for worsening visual haziness  Respiratory: Negative for choking and stridor.   Gastrointestinal: Negative for distension or worsening eructation Genitourinary: Negative for retention or change in urine volume.  Musculoskeletal: Negative for other MSK pain or swelling Skin: Negative for color change and worsening wound Neurological: Negative for tremors and numbness other than noted  Psychiatric/Behavioral: Negative for decreased concentration or agitation other than above   All other system neg per pt    Objective:   Physical Exam BP 138/78   Pulse 76   Temp 98.4 F (36.9 C) (Oral)   Resp 20   Wt 218 lb (98.9 kg)   SpO2 92%   BMI 30.40 kg/m  VS noted,  Constitutional: Pt appears in no apparent distress HENT: Head: NCAT.  Right Ear: External ear normal.  Left Ear: External ear normal.  Eyes: . Pupils are equal, round, and reactive to light. Conjunctivae and EOM are normal Neck: Normal range of motion. Neck supple.  Cardiovascular: Normal rate and regular rhythm.   Pulmonary/Chest: Effort normal and breath sounds without rales or wheezing.  Neurological: Pt is alert. mild confused , motor 5/5 intact Skin: Skin is warm, trace bilat LE ankle  edema, with mild erythema non raised or tender to distal pretibial above the ankle with excoriations, but no ulcer, red streaks or drainage  Psychiatric: Pt behavior is normal. No agitation. No other new exam findings     Assessment & Plan:

## 2016-01-22 NOTE — Assessment & Plan Note (Signed)
Mild, ill defined diestal RLE above knee with ecoriations,  for topical steroid prn, for derm if not improved, to f/u any worsening symptoms or concerns

## 2016-01-25 ENCOUNTER — Ambulatory Visit: Payer: Medicare Other | Admitting: Urology

## 2016-01-27 ENCOUNTER — Ambulatory Visit (INDEPENDENT_AMBULATORY_CARE_PROVIDER_SITE_OTHER): Payer: Medicare Other | Admitting: Pharmacist

## 2016-01-27 DIAGNOSIS — I483 Typical atrial flutter: Secondary | ICD-10-CM | POA: Diagnosis not present

## 2016-01-27 DIAGNOSIS — Z7901 Long term (current) use of anticoagulants: Secondary | ICD-10-CM

## 2016-01-27 DIAGNOSIS — I482 Chronic atrial fibrillation, unspecified: Secondary | ICD-10-CM

## 2016-01-27 LAB — POCT INR: INR: 3.6

## 2016-02-08 ENCOUNTER — Ambulatory Visit: Payer: Self-pay | Admitting: *Deleted

## 2016-02-09 ENCOUNTER — Encounter: Payer: Self-pay | Admitting: Internal Medicine

## 2016-02-09 ENCOUNTER — Ambulatory Visit (INDEPENDENT_AMBULATORY_CARE_PROVIDER_SITE_OTHER): Payer: Medicare Other | Admitting: Internal Medicine

## 2016-02-09 VITALS — BP 140/80 | HR 84 | Resp 20 | Wt 223.0 lb

## 2016-02-09 DIAGNOSIS — M25512 Pain in left shoulder: Secondary | ICD-10-CM | POA: Diagnosis not present

## 2016-02-09 DIAGNOSIS — IMO0002 Reserved for concepts with insufficient information to code with codable children: Secondary | ICD-10-CM

## 2016-02-09 DIAGNOSIS — E1140 Type 2 diabetes mellitus with diabetic neuropathy, unspecified: Secondary | ICD-10-CM | POA: Diagnosis not present

## 2016-02-09 DIAGNOSIS — I10 Essential (primary) hypertension: Secondary | ICD-10-CM | POA: Diagnosis not present

## 2016-02-09 DIAGNOSIS — E1165 Type 2 diabetes mellitus with hyperglycemia: Secondary | ICD-10-CM | POA: Diagnosis not present

## 2016-02-09 MED ORDER — HYDROCODONE-ACETAMINOPHEN 7.5-325 MG PO TABS: 1.0000 | ORAL_TABLET | Freq: Four times a day (QID) | ORAL | 0 refills | Status: DC | PRN

## 2016-02-09 MED ORDER — PREDNISONE 10 MG PO TABS: ORAL_TABLET | ORAL | 0 refills | Status: DC

## 2016-02-09 NOTE — Progress Notes (Signed)
Pre visit review using our clinic review tool, if applicable. No additional management support is needed unless otherwise documented below in the visit note. 

## 2016-02-09 NOTE — Patient Instructions (Signed)
Please take all new medication as prescribed - the pain medication, and the prednisone  Please continue all other medications as before, and refills have been done if requested.  Please have the pharmacy call with any other refills you may need.  Please keep your appointments with your specialists as you may have planned

## 2016-02-09 NOTE — Progress Notes (Signed)
Subjective:    Patient ID: [name], male    DOB: 11-10-1945, 71 y.o.   MRN: [id number]  HPI  Here to c/o 2 days acute onset pain and swelling to the left shoulder, now severe, very tender to touch, constant, radiates to the left neck and deltoid area, and extremely painful to even attempt to abduct, cannot put on coat without assist, declines disrobing for exam today.  Nothing makes better. Just woke up with this in the AM, no trauma, fever, rash or other skin change.  No recent lifting or heavy use of the arm or shoulder.  Pt denies chest pain, increased sob or doe, wheezing, orthopnea, PND, increased LE swelling, palpitations, dizziness or syncope.  Pt denies new neurological symptoms such as new headache, or facial or extremity weakness or numbness   Pt denies polydipsia, polyuria,  Past Medical History:  Diagnosis Date  . Aortic root dilatation ([place]) 02/02/2011  . Arthritis    "lower back; going back down both my sciatic nerves"  . 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 BENIGN PROSTATIC HYPERTROPHY 08/29/2006   takes Flomax daily  . 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 Cataracts, bilateral    immature  . CHF (congestive heart failure) (HCC)    takes Lasix daily  . Chronic back pain    HNP   . CORONARY ARTERY DISEASE 08/29/2006   takes Coumadin daily  . DEPRESSION 08/29/2006  . DIABETES MELLITUS, TYPE II 08/29/2006   takes Metformin,Januvia,and Glipizide  daily  . DIASTOLIC HEART FAILURE, CHRONIC 06/09/2008  . GOUT 04/22/2007   takes Allopurinol daily  . History of migraine    6yr ago  . HYPERLIPIDEMIA 08/29/2006   takes Atorvastatin daily  . HYPERTENSION 08/29/2006   takes Lisinopril daily  . INSOMNIA-SLEEP DISORDER-UNSPEC 10/23/2007  . Left lumbar radiculopathy 05/30/2010  . LUMBAR RADICULOPATHY, RIGHT 06/10/2007  . Muscle spasm    takes Zanaflex daily  . Myocardial infarction 12/27/10   "I've had several MIs"  . NEOPLASM, MALIGNANT, PROSTATE 11/26/2006    . PACEMAKER, PERMANENT 03/04/2008   pt denies this date  . Peripheral neuropathy (HCC)    takes Gabapentin daily  . Presence of permanent cardiac pacemaker   . Shortness of breath dyspnea    "all my life" with exertion  . Sleep apnea    "if I lay flat I quit breathing; HOB up I'm fine"   Past Surgical History:  Procedure Laterality Date  . COLONOSCOPY    . CORONARY ANGIOPLASTY WITH STENT PLACEMENT  12/27/10   "I've had a total of 9 cardiac stents put in"  . CORONARY ARTERY BYPASS GRAFT  1992   CABG X 2  . ESOPHAGOGASTRODUODENOSCOPY N/A 12/01/2013   Procedure: ESOPHAGOGASTRODUODENOSCOPY (E[facility]);  Surgeon: DLafayette Dragon MD;  [facility];  Service: Endoscopy;  Laterality: N/A;  . INSERT / REPLACE / REMOVE PACEMAKER  ~ 2004   initial pacemaker placement  . INSERT / REPLACE / REMOVE PACEMAKER  10/2009   generator change  . LUMBAR LAMINECTOMY/DECOMPRESSION MICRODISCECTOMY Right 03/23/2014   Procedure: LAMINECTOMY AND FORAMINOTOMY RIGHT LUMBAR THREE-FOUR,LUMBAR FOUR-FIVE, LUMBAR FIVE-SACRAL ONE;  Surgeon: HCharlie Pitter MD;  [facility];  Service: Neurosurgery;  Laterality: Right;  right  . LUMBAR LAMINECTOMY/DECOMPRESSION MICRODISCECTOMY Left 12/01/2014   Procedure: Left Lumbar Three-Four, Lumbar Four-Five Laminectomy and Foraminotomy;  Surgeon: HEarnie Larsson MD;  [facility];  Service: Neurosurgery;  Laterality: Left;  . s/p left arm surgury after work accident  1991   "2000# steel fell on it"  . s/p right hand surgury for foreign object  1970's   "piece of wood went in my hand; had to get that out"    reports that he quit smoking about 42 years ago. His smoking use included Cigarettes. He has a 27.00 pack-year smoking history. He has quit using smokeless tobacco. He reports that he does not drink alcohol or use drugs. family history includes Coronary artery disease (age of onset: 69) in his other; Diabetes in his mother, other, and sister; Heart disease in his  sister and sister; Lung cancer in his sister. Allergies  Allergen Reactions  . Crestor [Rosuvastatin Calcium] Other (See Comments)    Urination of blood  . Latex Rash and Other (See Comments)    Gloves cause welts on hands!!   Current Outpatient Prescriptions on File Prior to Visit  Medication Sig Dispense Refill  . allopurinol (ZYLOPRIM) 300 MG tablet take 1 tablet by mouth once daily (Patient taking differently: Take 300 mg by mouth daily. ) 90 tablet 3  . atorvastatin (LIPITOR) 80 MG tablet TAKE 1 TABLET BY MOUTH  DAILY 90 tablet 0  . Blood Glucose Monitoring Suppl (ONE TOUCH ULTRA 2) w/Device KIT Use as directed 1 each 3  . carvedilol (COREG) 25 MG tablet TAKE 1 TABLET BY MOUTH TWO  TIMES DAILY 180 tablet 0  . Cholecalciferol (VITAMIN D) 1000 UNITS capsule Take 1,000 Units by mouth 2 (two) times daily. Reported on 02/15/2015    . donepezil (ARICEPT) 5 MG tablet Take 1 tablet (5 mg total) by mouth at bedtime. 90 tablet 3  . fluocinonide cream (LIDEX) 0.25 % Apply 1 application topically 2 (two) times daily. 30 g 2  . fluticasone (FLONASE) 50 MCG/ACT nasal spray Use 2 sprays in each  nostril every day 48 g 2  . furosemide (LASIX) 40 MG tablet Take 1 tablet by mouth  daily 90 tablet 3  . furosemide (LASIX) 80 MG tablet Take 1 tablet (80 mg total) by mouth 2 (two) times daily. 60 tablet 5  . gabapentin (NEURONTIN) 300 MG capsule TAKE 2 CAPSULES BY MOUTH 3  TIMES DAILY 540 capsule 0  . glipiZIDE (GLUCOTROL XL) 10 MG 24 hr tablet TAKE 1 TABLET BY MOUTH  DAILY WITH BREAKFAST 90 tablet 0  . ibuprofen (ADVIL,MOTRIN) 200 MG tablet Take 800 mg by mouth daily as needed (pain).     Marland Kitchen lisinopril (PRINIVIL,ZESTRIL) 2.5 MG tablet TAKE 1 TABLET BY MOUTH  DAILY 90 tablet 0  . metFORMIN (GLUCOPHAGE) 500 MG tablet TAKE 2 TABLETS BY MOUTH TWO TIMES DAILY 360 tablet 0  . Multiple Vitamin (MULTIVITAMIN WITH MINERALS) TABS tablet Take 1 tablet by mouth every morning.    . ONE TOUCH ULTRA TEST test strip CHECK  BLOOD SUGAR TWO TIMES DAILY 200 each 0  . ONETOUCH DELICA LANCETS 85I MISC As directed    . ONETOUCH DELICA LANCETS 77O MISC USE AS DIRECTED THREE TIMES DAILY TO CHECK BLOOD SUGAR. 300 each 0  . potassium chloride SA (K-DUR,KLOR-CON) 20 MEQ tablet TAKE 1 TABLET BY MOUTH  EVERY EVENING 90 tablet 0  . sitaGLIPtin (JANUVIA) 100 MG tablet Take 1 tablet (100 mg total) by mouth daily. 90 tablet 3  . tamsulosin (FLOMAX) 0.4 MG CAPS capsule TAKE 1 CAPSULE BY MOUTH  EVERY 12 HOURS 180 capsule 0  . tiZANidine (ZANAFLEX) 4 MG tablet TAKE 1 TABLET BY MOUTH  EVERY 6 HOURS AS NEEDED FOR MUSCLE SPASMS. 180 tablet  3  . traMADol (ULTRAM) 50 MG tablet Take 1 tablet (50 mg total) by mouth every 8 (eight) hours as needed. 90 tablet 1  . warfarin (COUMADIN) 5 MG tablet TAKE 1 TABLET BY MOUTH DAILY OR AS DIRECTED BY COUMADIN CLINIC 90 tablet 1   No current facility-administered medications on file prior to visit.    Review of Systems  Constitutional: Negative for unusual diaphoresis or night sweats HENT: Negative for ear swelling or discharge Eyes: Negative for worsening visual haziness  Respiratory: Negative for choking and stridor.   Gastrointestinal: Negative for distension or worsening eructation Genitourinary: Negative for retention or change in urine volume.  Musculoskeletal: Negative for other MSK pain or swelling Skin: Negative for color change and worsening wound Neurological: Negative for tremors and numbness other than noted  Psychiatric/Behavioral: Negative for decreased concentration or agitation other than above   All other system neg per pt    Objective:   Physical Exam BP 140/80   Pulse 84   Resp 20   Wt 223 lb (101.2 kg)   SpO2 96%   BMI 31.10 kg/m  VS noted, pt unable to move left arm enough/declines taking off his button down shirt and undershirt Constitutional: Pt appears in no apparent distress HENT: Head: NCAT.  Right Ear: External ear normal.  Left Ear: External ear normal.    Eyes: . Pupils are equal, round, and reactive to light. Conjunctivae and EOM are normal Neck: Normal range of motion. Neck supple.  Cardiovascular: Normal rate and regular rhythm.   Pulmonary/Chest: Effort normal and breath sounds without rales or wheezing.  Left shoulder with large nondiscrete swelling seeminglly at the [facility] joint but possibly left shoulder as well Neurological: Pt is alert. Not confused , motor grossly intact Skin: Skin is warm. No rash, no LE edema Psychiatric: Pt behavior is normal. No agitation.      Assessment & Plan:

## 2016-02-09 NOTE — Assessment & Plan Note (Signed)
Severe, acute onset assoc with swelling without trauma or fever; suspect left AC joint acute gout or pseudogout, for hydrocodone prn, predpac asd, consider fillm or MRI or orthopedic if not readily improved in 1-2 days

## 2016-02-09 NOTE — Assessment & Plan Note (Signed)
stable overall by history and exam, recent data reviewed with pt, and pt to continue medical treatment as before,  to f/u any worsening symptoms or concerns Lab Results  Component Value Date   HGBA1C 7.0 (H) 10/07/2015   Pt to call for polys or cbg > 200 with steroid tx

## 2016-02-09 NOTE — Assessment & Plan Note (Signed)
stable overall by history and exam, recent data reviewed with pt, and pt to continue medical treatment as before,  to f/u any worsening symptoms or concerns BP Readings from Last 3 Encounters:  02/09/16 140/80  01/19/16 138/78  01/12/16 128/80

## 2016-02-22 ENCOUNTER — Other Ambulatory Visit: Payer: Self-pay | Admitting: Internal Medicine

## 2016-02-28 ENCOUNTER — Telehealth: Payer: Self-pay | Admitting: Internal Medicine

## 2016-02-28 NOTE — Telephone Encounter (Signed)
error 

## 2016-02-29 ENCOUNTER — Ambulatory Visit (INDEPENDENT_AMBULATORY_CARE_PROVIDER_SITE_OTHER): Payer: Medicare Other | Admitting: Pharmacist Clinician (PhC)/ Clinical Pharmacy Specialist

## 2016-02-29 DIAGNOSIS — I482 Chronic atrial fibrillation, unspecified: Secondary | ICD-10-CM

## 2016-02-29 DIAGNOSIS — I483 Typical atrial flutter: Secondary | ICD-10-CM

## 2016-02-29 DIAGNOSIS — Z7901 Long term (current) use of anticoagulants: Secondary | ICD-10-CM

## 2016-02-29 LAB — POCT INR: INR: 3.4

## 2016-03-03 ENCOUNTER — Ambulatory Visit: Payer: Medicare Other | Admitting: Internal Medicine

## 2016-03-13 ENCOUNTER — Ambulatory Visit (INDEPENDENT_AMBULATORY_CARE_PROVIDER_SITE_OTHER): Payer: Medicare Other | Admitting: Pharmacist Clinician (PhC)/ Clinical Pharmacy Specialist

## 2016-03-13 DIAGNOSIS — Z7901 Long term (current) use of anticoagulants: Secondary | ICD-10-CM

## 2016-03-13 DIAGNOSIS — I483 Typical atrial flutter: Secondary | ICD-10-CM

## 2016-03-13 DIAGNOSIS — I482 Chronic atrial fibrillation, unspecified: Secondary | ICD-10-CM

## 2016-03-13 LAB — POCT INR: INR: 1.7

## 2016-03-20 ENCOUNTER — Other Ambulatory Visit: Payer: Self-pay | Admitting: *Deleted

## 2016-03-20 NOTE — Patient Outreach (Signed)
[place] [facility]) Care Management  03/20/2016  RUTHIE PALMESE 07-27-1945 [id number]  While visiting with  Mr. Kibbey and his wife today, he told me he'd had an episode of hypoglycemia (cbg 60) while at church yesterday. He felt "like I was going to fall on my face". He reports "blurry vision" and "burning up like crazy". His fasting cbg that morning was 200. He took his morning medications as usual but said he wasn't hungry and had only eaten grits (he typically has eggs, sausage, and a pice of toast). Mr. Weisman says that he is experiencing more choking over the last few weeks, similar to symptoms he was experiencing in 2015 when Dr. [name] treated Mr. 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   I reached out to the office of Dr. Cathlean Cower to inquire about Mr. Targett appointment schedule and to notify him of Mr. Rannow symptoms. We were able to secure an appointment for Mr. Scafidi with [name] on 2/27 @ 9am.   Plan: I will follow up with Mr. Labella by phone after his appointment with [name].    [facility]  [phone]

## 2016-03-22 ENCOUNTER — Encounter: Payer: Self-pay | Admitting: Cardiology

## 2016-03-27 ENCOUNTER — Other Ambulatory Visit: Payer: Self-pay | Admitting: *Deleted

## 2016-03-27 NOTE — Patient Outreach (Signed)
[place] [facility]) Care Management  03/27/2016  DRAYCEN WEISHAUPT [birth year] [id number]  Telephone outreach to Mr. Glancy today to follow up on his report of hypoglycemia last week. Mrs. Smolenski answered the phone and stated that Mr. Tonks has been being more careful to eat protein at each meal and although he doesn't have a good appetite and feels full quickly, he has been eating "a little more". She denies that he has had further episodes of hyoglycemia. He is scheduled to see his primary care provider tomorrow.   Plan: I will follow up with Mr. Kotarski in March as scheduled.    [facility]  [phone]

## 2016-03-28 ENCOUNTER — Other Ambulatory Visit (INDEPENDENT_AMBULATORY_CARE_PROVIDER_SITE_OTHER): Payer: Medicare Other

## 2016-03-28 ENCOUNTER — Ambulatory Visit (INDEPENDENT_AMBULATORY_CARE_PROVIDER_SITE_OTHER): Payer: Medicare Other | Admitting: Internal Medicine

## 2016-03-28 ENCOUNTER — Encounter: Payer: Self-pay | Admitting: Internal Medicine

## 2016-03-28 VITALS — BP 136/72 | HR 82 | Temp 97.6°F | Ht 71.0 in | Wt 226.0 lb

## 2016-03-28 DIAGNOSIS — Z0001 Encounter for general adult medical examination with abnormal findings: Secondary | ICD-10-CM

## 2016-03-28 DIAGNOSIS — E1140 Type 2 diabetes mellitus with diabetic neuropathy, unspecified: Secondary | ICD-10-CM | POA: Diagnosis not present

## 2016-03-28 DIAGNOSIS — R1310 Dysphagia, unspecified: Secondary | ICD-10-CM | POA: Diagnosis not present

## 2016-03-28 DIAGNOSIS — E1165 Type 2 diabetes mellitus with hyperglycemia: Secondary | ICD-10-CM | POA: Diagnosis not present

## 2016-03-28 DIAGNOSIS — I10 Essential (primary) hypertension: Secondary | ICD-10-CM

## 2016-03-28 DIAGNOSIS — IMO0002 Reserved for concepts with insufficient information to code with codable children: Secondary | ICD-10-CM

## 2016-03-28 DIAGNOSIS — E785 Hyperlipidemia, unspecified: Secondary | ICD-10-CM | POA: Diagnosis not present

## 2016-03-28 LAB — MICROALBUMIN / CREATININE URINE RATIO
CREATININE, U: 115.4 mg/dL
MICROALB UR: 1.3 mg/dL (ref 0.0–1.9)
MICROALB/CREAT RATIO: 1.1 mg/g (ref 0.0–30.0)

## 2016-03-28 LAB — URINALYSIS, ROUTINE W REFLEX MICROSCOPIC
Bilirubin Urine: NEGATIVE
Hgb urine dipstick: NEGATIVE
Ketones, ur: NEGATIVE
Leukocytes, UA: NEGATIVE
NITRITE: NEGATIVE
RBC / HPF: NONE SEEN (ref 0–?)
Specific Gravity, Urine: 1.025 (ref 1.000–1.030)
Total Protein, Urine: NEGATIVE
URINE GLUCOSE: NEGATIVE
Urobilinogen, UA: 0.2 (ref 0.0–1.0)
WBC UA: NONE SEEN (ref 0–?)
pH: 5.5 (ref 5.0–8.0)

## 2016-03-28 LAB — LIPID PANEL
CHOL/HDL RATIO: 2
Cholesterol: 99 mg/dL (ref 0–200)
HDL: 43.4 mg/dL (ref >39.00)
LDL CALC: 45 mg/dL (ref 0–99)
NONHDL: 55.31
Triglycerides: 52 mg/dL (ref 0.0–149.0)
VLDL: 10.4 mg/dL (ref 0.0–40.0)

## 2016-03-28 LAB — HEPATIC FUNCTION PANEL
ALT: 36 U/L (ref 0–53)
AST: 29 U/L (ref 0–37)
Albumin: 4 g/dL (ref 3.5–5.2)
Alkaline Phosphatase: 88 U/L (ref 39–117)
BILIRUBIN DIRECT: 0.1 mg/dL (ref 0.0–0.3)
BILIRUBIN TOTAL: 0.4 mg/dL (ref 0.2–1.2)
Total Protein: 6.7 g/dL (ref 6.0–8.3)

## 2016-03-28 LAB — CBC WITH DIFFERENTIAL/PLATELET
BASOS PCT: 0.4 % (ref 0.0–3.0)
Basophils Absolute: 0 10*3/uL (ref 0.0–0.1)
EOS PCT: 3.2 % (ref 0.0–5.0)
Eosinophils Absolute: 0.2 10*3/uL (ref 0.0–0.7)
HEMATOCRIT: 34.7 % — AB (ref 39.0–52.0)
HEMOGLOBIN: 11.3 g/dL — AB (ref 13.0–17.0)
LYMPHS PCT: 29.2 % (ref 12.0–46.0)
Lymphs Abs: 1.4 10*3/uL (ref 0.7–4.0)
MCHC: 32.6 g/dL (ref 30.0–36.0)
MCV: 82.5 fl (ref 78.0–100.0)
MONO ABS: 0.5 10*3/uL (ref 0.1–1.0)
Monocytes Relative: 10.3 % (ref 3.0–12.0)
NEUTROS ABS: 2.7 10*3/uL (ref 1.4–7.7)
Neutrophils Relative %: 56.9 % (ref 43.0–77.0)
PLATELETS: 141 10*3/uL — AB (ref 150.0–400.0)
RBC: 4.21 Mil/uL — ABNORMAL LOW (ref 4.22–5.81)
RDW: 18 % — AB (ref 11.5–15.5)
WBC: 4.8 10*3/uL (ref 4.0–10.5)

## 2016-03-28 LAB — BASIC METABOLIC PANEL
BUN: 12 mg/dL (ref 6–23)
CHLORIDE: 108 meq/L (ref 96–112)
CO2: 28 mEq/L (ref 19–32)
Calcium: 9.5 mg/dL (ref 8.4–10.5)
Creatinine, Ser: 0.89 mg/dL (ref 0.40–1.50)
GFR: 89.79 mL/min (ref >60.00)
Glucose, Bld: 113 mg/dL — ABNORMAL HIGH (ref 70–99)
POTASSIUM: 5.7 meq/L — AB (ref 3.5–5.1)
Sodium: 142 mEq/L (ref 135–145)

## 2016-03-28 LAB — HEMOGLOBIN A1C: Hgb A1c MFr Bld: 7.8 % — ABNORMAL HIGH (ref 4.6–6.5)

## 2016-03-28 LAB — TSH: TSH: 1.23 u[IU]/mL (ref 0.35–4.50)

## 2016-03-28 NOTE — Patient Instructions (Addendum)
Please continue all other medications as before, and refills have been done if requested.  Please have the pharmacy call with any other refills you may need.  Please continue your efforts at being more active, low cholesterol diet, and weight control.  You are otherwise up to date with prevention measures today.  Please keep your appointments with your specialists as you may have planned  You will be contacted regarding the referral for: podiatry, and eye doctor, and GI  Please go to the LAB in the Basement (turn left off the elevator) for the tests to be done today  You will be contacted by phone if any changes need to be made immediately.  Otherwise, you will receive a letter about your results with an explanation, but please check with MyChart first.  Please remember to sign up for MyChart if you have not done so, as this will be important to you in the future with finding out test results, communicating by private email, and scheduling acute appointments online when needed.  Please return in 6 months, or sooner if needed, with Lab testing done 3-5 days before

## 2016-03-28 NOTE — Progress Notes (Signed)
Subjective:    Patient ID: [name], male    DOB: [birth year], 71 y.o.   MRN: [id number]  HPI  Here for wellness and f/u;  Overall doing ok;  Pt denies Chest pain, worsening SOB, DOE, wheezing, orthopnea, PND, worsening LE edema, palpitations, dizziness or syncope.  Pt denies neurological change such as new headache, facial or extremity weakness.  Pt denies polydipsia, polyuria, or low sugar symptoms. Pt states overall good compliance with treatment and medications, good tolerability, and has been trying to follow appropriate diet.  Pt denies worsening depressive symptoms, suicidal ideation or panic. No fever, night sweats, wt loss, loss of appetite, or other constitutional symptoms.  Pt states good ability with ADL's, has low fall risk, home safety reviewed and adequate, no other significant changes in hearing or vision, and only occasionally active with exercise. Walks with cane, no falls, due to left hip weakness.Marland Kitchen  He is upset that wife restricts him from mowing the yard even with the riding lawn mower and other things.   Needs new podiatrist as last one stopped taking his insurance and another retired, last exam 2 yrs.   Due for optho exam as last one was 3 yrs and sounds like only refraction vision check.  Also with 2 wks onset persistent regurgitation/vomiting without nausea, but may have some vague sensation of epigastric fullness, pressure.   an Denies worsening reflux, abd pain,  bowel change or blood. Has reaction to both soids and liquids, seems to happen just about every time eating in past 2 wks.  Has not yet tried antacid.  Has also some trouble with upper chest swallowing as well s/p EGD with dilation approx 2 yrs ago, last seen per Dr Olevia Perches.  No fever, wt loss.   Wt Readings from Last 3 Encounters:  03/28/16 226 lb (102.5 kg)  02/09/16 223 lb (101.2 kg)  01/19/16 218 lb (98.9 kg)   Past Medical History:  Diagnosis Date  . Aortic root dilatation ([place]) 02/02/2011  . Arthritis      "lower back; going back down both my sciatic nerves"  . 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 BENIGN PROSTATIC HYPERTROPHY 08/29/2006   takes Flomax daily  . 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 Cataracts, bilateral    immature  . CHF (congestive heart failure) (HCC)    takes Lasix daily  . Chronic back pain    HNP   . CORONARY ARTERY DISEASE 08/29/2006   takes Coumadin daily  . DEPRESSION 08/29/2006  . DIABETES MELLITUS, TYPE II 08/29/2006   takes Metformin,Januvia,and Glipizide  daily  . DIASTOLIC HEART FAILURE, CHRONIC 06/09/2008  . GOUT 04/22/2007   takes Allopurinol daily  . History of migraine    23yr ago  . HYPERLIPIDEMIA 08/29/2006   takes Atorvastatin daily  . HYPERTENSION 08/29/2006   takes Lisinopril daily  . INSOMNIA-SLEEP DISORDER-UNSPEC 10/23/2007  . Left lumbar radiculopathy 05/30/2010  . LUMBAR RADICULOPATHY, RIGHT 06/10/2007  . Muscle spasm    takes Zanaflex daily  . Myocardial infarction 12/27/10   "I've had several MIs"  . 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 PACEMAKER, PERMANENT 03/04/2008   pt denies this date  . Peripheral neuropathy (HCC)    takes Gabapentin daily  . Presence of permanent cardiac pacemaker   . Shortness of breath dyspnea    "all my life" with exertion  . Sleep apnea    "if I lay flat I quit breathing; HOB up I'm fine"   Past Surgical History:  Procedure Laterality Date  . COLONOSCOPY    . CORONARY ANGIOPLASTY WITH STENT PLACEMENT  12/27/10   "I've had a total of 9 cardiac stents put in"  . CORONARY ARTERY BYPASS GRAFT  1992   CABG X 2  . ESOPHAGOGASTRODUODENOSCOPY N/A 12/01/2013   Procedure: ESOPHAGOGASTRODUODENOSCOPY (EGD);  Surgeon: Lafayette Dragon, MD;  Location: [facility];  Service: Endoscopy;  Laterality: N/A;  . INSERT / REPLACE / REMOVE PACEMAKER  ~ 2004   initial pacemaker placement  . INSERT / REPLACE / REMOVE PACEMAKER  10/2009   generator change  . LUMBAR LAMINECTOMY/DECOMPRESSION MICRODISCECTOMY  Right 03/23/2014   Procedure: LAMINECTOMY AND FORAMINOTOMY RIGHT LUMBAR THREE-FOUR,LUMBAR FOUR-FIVE, LUMBAR FIVE-SACRAL ONE;  Surgeon: Charlie Pitter, MD;  Location: [facility];  Service: Neurosurgery;  Laterality: Right;  right  . LUMBAR LAMINECTOMY/DECOMPRESSION MICRODISCECTOMY Left 12/01/2014   Procedure: Left Lumbar Three-Four, Lumbar Four-Five Laminectomy and Foraminotomy;  Surgeon: Earnie Larsson, MD;  Location: [facility];  Service: Neurosurgery;  Laterality: Left;  . s/p left arm surgury after work accident  1991   "2000# steel fell on it"  . s/p right hand surgury for foreign object  1970's   "piece of wood went in my hand; had to get that out"    reports that he quit smoking about 43 years ago. His smoking use included Cigarettes. He has a 27.00 pack-year smoking history. He has quit using smokeless tobacco. He reports that he does not drink alcohol or use drugs. family history includes Coronary artery disease (age of onset: 50) in his other; Diabetes in his mother, other, and sister; Heart disease in his sister and sister; Lung cancer in his sister. Allergies  Allergen Reactions  . Crestor [Rosuvastatin Calcium] Other (See Comments)    Urination of blood  . Latex Rash and Other (See Comments)    Gloves cause welts on hands!!   Current Outpatient Prescriptions on File Prior to Visit  Medication Sig Dispense Refill  . allopurinol (ZYLOPRIM) 300 MG tablet take 1 tablet by mouth once daily (Patient taking differently: Take 300 mg by mouth daily. ) 90 tablet 3  . atorvastatin (LIPITOR) 80 MG tablet TAKE 1 TABLET BY MOUTH  DAILY 90 tablet 0  . Blood Glucose Monitoring Suppl (ONE TOUCH ULTRA 2) w/Device KIT Use as directed 1 each 3  . carvedilol (COREG) 25 MG tablet TAKE 1 TABLET BY MOUTH TWO  TIMES DAILY 180 tablet 0  . Cholecalciferol (VITAMIN D) 1000 UNITS capsule Take 1,000 Units by mouth 2 (two) times daily. Reported on 02/15/2015    . donepezil (ARICEPT) 5 MG tablet Take 1 tablet (5 mg  total) by mouth at bedtime. 90 tablet 3  . fluocinonide cream (LIDEX) 1.63 % Apply 1 application topically 2 (two) times daily. 30 g 2  . fluticasone (FLONASE) 50 MCG/ACT nasal spray Use 2 sprays in each  nostril every day 48 g 2  . furosemide (LASIX) 40 MG tablet Take 1 tablet by mouth  daily 90 tablet 3  . furosemide (LASIX) 80 MG tablet Take 1 tablet (80 mg total) by mouth 2 (two) times daily. 60 tablet 5  . gabapentin (NEURONTIN) 300 MG capsule TAKE 2 CAPSULES BY MOUTH 3  TIMES DAILY 540 capsule 0  . glipiZIDE (GLUCOTROL XL) 10 MG 24 hr tablet TAKE 1 TABLET BY MOUTH  DAILY WITH BREAKFAST 90 tablet 0  . HYDROcodone-acetaminophen (NORCO) 7.5-325 MG tablet Take 1 tablet by mouth every 6 (six) hours as needed for moderate pain.  40 tablet 0  . ibuprofen (ADVIL,MOTRIN) 200 MG tablet Take 800 mg by mouth daily as needed (pain).     Marland Kitchen lisinopril (PRINIVIL,ZESTRIL) 2.5 MG tablet TAKE 1 TABLET BY MOUTH  DAILY 90 tablet 0  . metFORMIN (GLUCOPHAGE) 500 MG tablet TAKE 2 TABLETS BY MOUTH TWO TIMES DAILY 360 tablet 0  . Multiple Vitamin (MULTIVITAMIN WITH MINERALS) TABS tablet Take 1 tablet by mouth every morning.    . ONE TOUCH ULTRA TEST test strip CHECK BLOOD SUGAR TWO TIMES DAILY 200 each 0  . ONETOUCH DELICA LANCETS 25Z MISC As directed    . potassium chloride SA (K-DUR,KLOR-CON) 20 MEQ tablet TAKE 1 TABLET BY MOUTH  EVERY EVENING 90 tablet 0  . predniSONE (DELTASONE) 10 MG tablet 4tab by mouth x 3day,3tab x 3day,2tab x 3 day,1 tab x 3 day 30 tablet 0  . sitaGLIPtin (JANUVIA) 100 MG tablet Take 1 tablet (100 mg total) by mouth daily. 90 tablet 3  . tamsulosin (FLOMAX) 0.4 MG CAPS capsule TAKE 1 CAPSULE BY MOUTH  EVERY 12 HOURS 180 capsule 0  . tiZANidine (ZANAFLEX) 4 MG tablet TAKE 1 TABLET BY MOUTH  EVERY 6 HOURS AS NEEDED FOR MUSCLE SPASMS. 180 tablet 3  . traMADol (ULTRAM) 50 MG tablet Take 1 tablet (50 mg total) by mouth every 8 (eight) hours as needed. 90 tablet 1  . warfarin (COUMADIN) 5 MG  tablet TAKE 1 TABLET BY MOUTH DAILY OR AS DIRECTED BY COUMADIN CLINIC 90 tablet 1   No current facility-administered medications on file prior to visit.    Review of Systems Constitutional: Negative for increased diaphoresis, or other activity, appetite or siginficant weight change other than noted HENT: Negative for worsening hearing loss, ear pain, facial swelling, mouth sores and neck stiffness.   Eyes: Negative for other worsening pain, redness or visual disturbance.  Respiratory: Negative for choking or stridor Cardiovascular: Negative for other chest pain and palpitations.  Gastrointestinal: Negative for worsening diarrhea, blood in stool, or abdominal distention Genitourinary: Negative for hematuria, flank pain or change in urine volume.  Musculoskeletal: Negative for myalgias or other joint complaints.  Skin: Negative for other color change and wound or drainage.  Neurological: Negative for syncope and numbness. other than noted Hematological: Negative for adenopathy. or other swelling Psychiatric/Behavioral: Negative for hallucinations, SI, self-injury, decreased concentration or other worsening agitation.  All other system neg per pt    Objective:   Physical Exam BP 136/72   Pulse 82   Temp 97.6 F (36.4 C)   Ht _0  (1.803 m)   Wt 226 lb (102.5 kg)   SpO2 99%   BMI 31.52 kg/m  VS noted,  Constitutional: Pt is oriented to person, place, and time. Appears well-developed and well-nourished, in no significant distress Head: Normocephalic and atraumatic  Eyes: Conjunctivae and EOM are normal. Pupils are equal, round, and reactive to light Right Ear: External ear normal.  Left Ear: External ear normal Nose: Nose normal.  Mouth/Throat: Oropharynx is clear and moist  Neck: Normal range of motion. Neck supple. No JVD present. No tracheal deviation present or significant neck LA or mass Cardiovascular: Normal rate, regular rhythm, normal heart sounds and intact distal  pulses.   Pulmonary/Chest: Effort normal and breath sounds without rales or wheezing  Abdominal: Soft. Bowel sounds are normal. NT. No HSM  Musculoskeletal: Normal range of motion. Exhibits no edema Lymphadenopathy: Has no cervical adenopathy.  Neurological: Pt is alert and oriented to person, place, and time. Pt has  normal reflexes. No cranial nerve deficit. Motor grossly intact Skin: Skin is warm and dry. No rash noted or new ulcers Psychiatric:  Has normal mood and affect. Behavior is normal.  No other new exam findings    Assessment & Plan:

## 2016-03-29 ENCOUNTER — Encounter: Payer: Self-pay | Admitting: Internal Medicine

## 2016-03-29 ENCOUNTER — Other Ambulatory Visit: Payer: Self-pay | Admitting: Internal Medicine

## 2016-03-29 NOTE — Assessment & Plan Note (Signed)
stable overall by history and exam, recent data reviewed with pt, and pt to continue medical treatment as before,  to f/u any worsening symptoms or concerns Lab Results  Component Value Date   LDLCALC 45 03/28/2016    

## 2016-03-29 NOTE — Assessment & Plan Note (Signed)

## 2016-03-29 NOTE — Assessment & Plan Note (Signed)
Lab Results  Component Value Date   HGBA1C 7.8 (H) 03/28/2016   stable overall by history and exam, recent data reviewed with pt, and pt to continue medical treatment as before,  to f/u any worsening symptoms or concerns

## 2016-03-29 NOTE — Progress Notes (Signed)
HPI: FU coronary artery disease status post coronary artery bypass graft, atrial flutter and previous pacemaker placement. Last cardiac catheterization in May of 2010 showed a normal left main, a 50-70% circumflex, a totally occluded LAD, and a focal 70% right coronary artery. The saphenous vein graft to the circumflex was occluded. The LIMA to the LAD was patent. Ejection fraction was 60%. Carotid Dopplers in October of 2011 were normal. Repeat echocardiogram in August 2015 showed EF 55-60, mild LAE. At previous OV pt noted to be in atrial flutter; seen by GT and rate control and anticoagulation recommended. Nuclear study 2/16 showed EF 52, fixed apical defect; no ischemia. Abdominal ultrasound May 2016 showed no aneurysm. Since last seen, the patient has dyspnea with more extreme activities but not with routine activities. It is relieved with rest. It is not associated with chest pain. There is no orthopnea, PND or pedal edema. There is no syncope or palpitations. There is no exertional chest pain.   Current Outpatient Prescriptions  Medication Sig Dispense Refill  . allopurinol (ZYLOPRIM) 300 MG tablet take 1 tablet by mouth once daily (Patient taking differently: Take 300 mg by mouth daily. ) 90 tablet 3  . atorvastatin (LIPITOR) 80 MG tablet TAKE 1 TABLET BY MOUTH  DAILY 90 tablet 0  . Blood Glucose Monitoring Suppl (ONE TOUCH ULTRA 2) w/Device KIT Use as directed 1 each 3  . carvedilol (COREG) 25 MG tablet TAKE 1 TABLET BY MOUTH TWO  TIMES DAILY 180 tablet 0  . Cholecalciferol (VITAMIN D) 1000 UNITS capsule Take 1,000 Units by mouth 2 (two) times daily. Reported on 02/15/2015    . donepezil (ARICEPT) 5 MG tablet Take 1 tablet (5 mg total) by mouth at bedtime. 90 tablet 3  . fluocinonide cream (LIDEX) 7.98 % Apply 1 application topically 2 (two) times daily. 30 g 2  . fluticasone (FLONASE) 50 MCG/ACT nasal spray Use 2 sprays in each  nostril every day 48 g 2  . furosemide (LASIX) 40 MG  tablet Take 1 tablet by mouth  daily 90 tablet 3  . furosemide (LASIX) 80 MG tablet Take 1 tablet (80 mg total) by mouth 2 (two) times daily. 60 tablet 5  . gabapentin (NEURONTIN) 300 MG capsule TAKE 2 CAPSULES BY MOUTH 3  TIMES DAILY 540 capsule 0  . glipiZIDE (GLUCOTROL XL) 10 MG 24 hr tablet TAKE 1 TABLET BY MOUTH  DAILY WITH BREAKFAST 90 tablet 0  . HYDROcodone-acetaminophen (NORCO) 7.5-325 MG tablet Take 1 tablet by mouth every 6 (six) hours as needed for moderate pain. 40 tablet 0  . ibuprofen (ADVIL,MOTRIN) 200 MG tablet Take 800 mg by mouth daily as needed (pain).     Marland Kitchen lisinopril (PRINIVIL,ZESTRIL) 2.5 MG tablet TAKE 1 TABLET BY MOUTH  DAILY 90 tablet 0  . metFORMIN (GLUCOPHAGE) 500 MG tablet TAKE 2 TABLETS BY MOUTH TWO TIMES DAILY 360 tablet 0  . Multiple Vitamin (MULTIVITAMIN WITH MINERALS) TABS tablet Take 1 tablet by mouth every morning.    . ONE TOUCH ULTRA TEST test strip CHECK BLOOD SUGAR TWO TIMES DAILY 200 each 0  . ONETOUCH DELICA LANCETS 92J MISC As directed    . potassium chloride SA (K-DUR,KLOR-CON) 20 MEQ tablet TAKE 1 TABLET BY MOUTH  EVERY EVENING 90 tablet 0  . predniSONE (DELTASONE) 10 MG tablet 4tab by mouth x 3day,3tab x 3day,2tab x 3 day,1 tab x 3 day 30 tablet 0  . sitaGLIPtin (JANUVIA) 100 MG tablet Take 1 tablet (  100 mg total) by mouth daily. 90 tablet 3  . tamsulosin (FLOMAX) 0.4 MG CAPS capsule TAKE 1 CAPSULE BY MOUTH  EVERY 12 HOURS 180 capsule 0  . tiZANidine (ZANAFLEX) 4 MG tablet TAKE 1 TABLET BY MOUTH  EVERY 6 HOURS AS NEEDED FOR MUSCLE SPASMS. 180 tablet 3  . traMADol (ULTRAM) 50 MG tablet Take 1 tablet (50 mg total) by mouth every 8 (eight) hours as needed. 90 tablet 1  . warfarin (COUMADIN) 5 MG tablet TAKE 1 TABLET BY MOUTH DAILY OR AS DIRECTED BY COUMADIN CLINIC 90 tablet 1   No current facility-administered medications for this visit.      Past Medical History:  Diagnosis Date  . Aortic root dilatation ([place]) 02/02/2011  . Arthritis    "lower  back; going back down both my sciatic nerves"  . 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 BENIGN PROSTATIC HYPERTROPHY 08/29/2006   takes Flomax daily  . 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 Cataracts, bilateral    immature  . CHF (congestive heart failure) (HCC)    takes Lasix daily  . Chronic back pain    HNP   . CORONARY ARTERY DISEASE 08/29/2006   takes Coumadin daily  . DEPRESSION 08/29/2006  . DIABETES MELLITUS, TYPE II 08/29/2006   takes Metformin,Januvia,and Glipizide  daily  . DIASTOLIC HEART FAILURE, CHRONIC 06/09/2008  . GOUT 04/22/2007   takes Allopurinol daily  . History of migraine    70yr ago  . HYPERLIPIDEMIA 08/29/2006   takes Atorvastatin daily  . HYPERTENSION 08/29/2006   takes Lisinopril daily  . INSOMNIA-SLEEP DISORDER-UNSPEC 10/23/2007  . Left lumbar radiculopathy 05/30/2010  . LUMBAR RADICULOPATHY, RIGHT 06/10/2007  . Muscle spasm    takes Zanaflex daily  . Myocardial infarction 12/27/10   "I've had several MIs"  . 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 PACEMAKER, PERMANENT 03/04/2008   pt denies this date  . Peripheral neuropathy (HCC)    takes Gabapentin daily  . Presence of permanent cardiac pacemaker   . Shortness of breath dyspnea    "all my life" with exertion  . Sleep apnea    "if I lay flat I quit breathing; HOB up I'm fine"    Past Surgical History:  Procedure Laterality Date  . COLONOSCOPY    . CORONARY ANGIOPLASTY WITH STENT PLACEMENT  12/27/10   "I've had a total of 9 cardiac stents put in"  . CORONARY ARTERY BYPASS GRAFT  1992   CABG X 2  . ESOPHAGOGASTRODUODENOSCOPY N/A 12/01/2013   Procedure: ESOPHAGOGASTRODUODENOSCOPY (EGD);  Surgeon: DLafayette Dragon MD;  [facility];  Service: Endoscopy;  Laterality: N/A;  . INSERT / REPLACE / REMOVE PACEMAKER  ~ 2004   initial pacemaker placement  . INSERT / REPLACE / REMOVE PACEMAKER  10/2009   generator change  . LUMBAR LAMINECTOMY/DECOMPRESSION MICRODISCECTOMY Right  03/23/2014   Procedure: LAMINECTOMY AND FORAMINOTOMY RIGHT LUMBAR THREE-FOUR,LUMBAR FOUR-FIVE, LUMBAR FIVE-SACRAL ONE;  Surgeon: HCharlie Pitter MD;  [facility];  Service: Neurosurgery;  Laterality: Right;  right  . LUMBAR LAMINECTOMY/DECOMPRESSION MICRODISCECTOMY Left 12/01/2014   Procedure: Left Lumbar Three-Four, Lumbar Four-Five Laminectomy and Foraminotomy;  Surgeon: HEarnie Larsson MD;  [facility];  Service: Neurosurgery;  Laterality: Left;  . s/p left arm surgury after work accident  1991   "2000# steel fell on it"  . s/p right hand surgury for foreign object  1970's   "piece of wood went in my hand; had to get that out"  Social History   Social History  . Marital status: Married    Spouse name: N/A  . Number of children: 2  . Years of education: N/A   Occupational History  . prior work Nature conservation officer  . disabled since 2004    Social History Main Topics  . Smoking status: Former Smoker    Packs/day: 3.00    Years: 9.00    Types: Cigarettes    Quit date: 02/26/1973  . Smokeless tobacco: Former Systems developer     Comment: quit smoking 34yr ago  . Alcohol use No  . Drug use: No     Comment: "used pouches of tobacco for a long time; quit those 12/09/1970"  . Sexual activity: Yes   Other Topics Concern  . Not on file   Social History Narrative  . No narrative on file    Family History  Problem Relation Age of Onset  . Diabetes Mother   . Diabetes Sister   . Heart disease Sister     2 sister died with heart disease  . Coronary artery disease Other 532   male, first degree relative  . Diabetes Other     1st degree relative  . Heart disease Sister   . Lung cancer Sister     deceased    ROS: no fevers or chills, productive cough, hemoptysis, dysphasia, odynophagia, melena, hematochezia, dysuria, hematuria, rash, seizure activity, orthopnea, PND, pedal edema, claudication. Remaining systems are negative.  Physical Exam: Well-developed  well-nourished in no acute distress.  Skin is warm and dry.  HEENT is normal.  Neck is supple. No bruits Chest is clear to auscultation with normal expansion.  Cardiovascular exam is regular rate and rhythm.  Abdominal exam nontender or distended. No masses palpated. Extremities show no edema. neuro grossly intact  ECG-Ventricular paced rhythm with underlying atrial flutter. personally reviewed  A/P  1 Atrial flutter-continue carvedilol for rate control. Continue Coumadin. Dr. TLovena Lehas seen previously and did not recommend flutter ablation.  2 Coronary artery disease-continue statin. No aspirin given need for anticoagulation.  3 diastolic congestive heart failure, chronic-continue present dose of Lasix. Euvolemic on examination.  4 hypertension-blood pressure controlled. Continue present medications.  5 hyperlipidemia-continue statin.  6 prior pacemaker-management per electrophysiology.  BKirk Ruths MD

## 2016-03-29 NOTE — Assessment & Plan Note (Addendum)
New onset worsening, to cont same tx but refer GI as may need EGD for eval and tx  In addition to the time spent performing CPE, I spent an additional 15 minutes face to face,in which greater than 50% of this time was spent in counseling and coordination of care for patient's illness as documented.

## 2016-03-29 NOTE — Assessment & Plan Note (Signed)
stable overall by history and exam, recent data reviewed with pt, and pt to continue medical treatment as before,  to f/u any worsening symptoms or concerns' BP Readings from Last 3 Encounters:  03/28/16 136/72  02/09/16 140/80  01/19/16 138/78

## 2016-03-30 ENCOUNTER — Telehealth: Payer: Self-pay | Admitting: Internal Medicine

## 2016-03-30 NOTE — Telephone Encounter (Signed)
Inform pt of test result. Please make sure no med were send in for his A1C.

## 2016-03-31 ENCOUNTER — Encounter: Payer: Self-pay | Admitting: Cardiology

## 2016-03-31 ENCOUNTER — Ambulatory Visit (INDEPENDENT_AMBULATORY_CARE_PROVIDER_SITE_OTHER): Payer: Medicare Other | Admitting: Pharmacist Clinician (PhC)/ Clinical Pharmacy Specialist

## 2016-03-31 ENCOUNTER — Ambulatory Visit (INDEPENDENT_AMBULATORY_CARE_PROVIDER_SITE_OTHER): Payer: Medicare Other | Admitting: Cardiology

## 2016-03-31 VITALS — BP 132/76 | HR 65 | Ht 71.0 in | Wt 223.0 lb

## 2016-03-31 DIAGNOSIS — I483 Typical atrial flutter: Secondary | ICD-10-CM

## 2016-03-31 DIAGNOSIS — Z7901 Long term (current) use of anticoagulants: Secondary | ICD-10-CM

## 2016-03-31 DIAGNOSIS — Z950 Presence of cardiac pacemaker: Secondary | ICD-10-CM

## 2016-03-31 DIAGNOSIS — I482 Chronic atrial fibrillation, unspecified: Secondary | ICD-10-CM

## 2016-03-31 DIAGNOSIS — I4891 Unspecified atrial fibrillation: Secondary | ICD-10-CM | POA: Diagnosis not present

## 2016-03-31 DIAGNOSIS — I25710 Atherosclerosis of autologous vein coronary artery bypass graft(s) with unstable angina pectoris: Secondary | ICD-10-CM

## 2016-03-31 DIAGNOSIS — I10 Essential (primary) hypertension: Secondary | ICD-10-CM

## 2016-03-31 LAB — POCT INR: INR: 2.4

## 2016-03-31 MED ORDER — GLIPIZIDE ER 2.5 MG PO TB24: 2.5000 mg | ORAL_TABLET | Freq: Every day | ORAL | 1 refills | Status: DC

## 2016-03-31 NOTE — Telephone Encounter (Signed)
Md wanted pt to start Glipizide 2.5 ER sent rx to [facility].../lmb

## 2016-03-31 NOTE — Patient Instructions (Signed)
Your physician wants you to follow-up in: ONE YEAR WITH DR CRENSHAW You will receive a reminder letter in the mail two months in advance. If you don't receive a letter, please call our office to schedule the follow-up appointment.   If you need a refill on your cardiac medications before your next appointment, please call your pharmacy.  

## 2016-04-06 ENCOUNTER — Other Ambulatory Visit: Payer: Self-pay | Admitting: *Deleted

## 2016-04-06 NOTE — Patient Outreach (Signed)
[facility] [facility]) Care Management  04/06/2016  [name] [birth year] [id number]  Outreach to [name] to follow up on recent worsening dysphagia. His symptoms have persisted. He is eating very small meals and finding it difficult to swallow. He is scheduled for diagnostic evaluation on Thursday.   Despite [name] worsening p.o. Intake, his cbg's have been increasing. On his most recent visit with his PCP, he was started on Glipizide which he is now taking according to prescribing instructions.   Plan: I will see [name] at home for face to face assessment and ongoing intervention, education, and care coordination related to diabetes care and coordination around his most recent problems related to dysphagia.    [facility] CM Care Plan Problem One   Flowsheet Row Most Recent Value  Care Plan Problem One  Acute/Chronic Health Condition (dysphagia, early satiety)  Role Documenting the Problem One  Care Management Coordinator  Care Plan for Problem One  Active  THN Long Term Goal (31-90 days)  Over the next 60 days, patient will verbalize understanding of plan of care for treatment of dysphagia and early satiety  THN Long Term Goal Start Date  03/20/16  Interventions for Problem One Long Term Goal  In depth interview with patient re: symptoms,  discussed need for provider input  THN CM Short Term Goal #1 (0-30 days)  Over the next 10 days, patient will attend scheduled provider appointment  [facility] CM Short Term Goal #1 Start Date  03/20/16  Interventions for Short Term Goal #1  Provided assistance for patient contact provider office to request appointment    [facility] CM Care Plan Problem Two   Flowsheet Row Most Recent Value  Care Plan Problem Two  Acute/Chronic Health Condition (hypoglycemia)  Role Documenting the Problem Two  Care Management [place] for Problem Two  Active  Interventions for Problem Two Long Term Goal   Reviewed cbg record,  discussed dietary  intake,  reviewed and discussed medication management  THN Long Term Goal (31-90) days  Over the next 60 days, patient willl report understanding of plan of care for treatment of hypoglycemia  THN Long Term Goal Start Date  03/20/16  THN CM Short Term Goal #1 (0-30 days)  Over the next 30 days, patient will report intake of balanced meals three times daily (as per prescribed carb modiified dietary guidelines)  THN CM Short Term Goal #1 Start Date  03/20/16  Interventions for Short Term Goal #2   Reviewed need for protein with each meal,  discussed new symptom: dysphagia and early satiety,  Emmi educational materials prescribed   THN CM Short Term Goal #2 (0-30 days)  Over the next 10 days, patient will attend provider appointment as scheduled  [facility]N CM Short Term Goal #2 Start Date  03/20/16  Interventions for Short Term Goal #2  assisted patient with scheduling provider appointment  [facility] CM Short Term Goal #3 (0-30 days)  Over the next 30 days, patient will verbalize understanding of signs and symptoms of EARLY hypoglycemia  THN CM Short Term Goal #3 Start Date  03/20/16  Interventions for Short Term Goal #3  Emmi educational materials prescribed     [place] Care Management  [phone]

## 2016-04-07 ENCOUNTER — Ambulatory Visit (INDEPENDENT_AMBULATORY_CARE_PROVIDER_SITE_OTHER): Payer: Medicare Other | Admitting: Physician Assistant

## 2016-04-07 ENCOUNTER — Encounter: Payer: Self-pay | Admitting: Physician Assistant

## 2016-04-07 VITALS — BP 134/62 | HR 87 | Ht 71.0 in | Wt 219.4 lb

## 2016-04-07 DIAGNOSIS — R1310 Dysphagia, unspecified: Secondary | ICD-10-CM

## 2016-04-07 NOTE — Patient Instructions (Addendum)
If you are age 70 or older, your body mass index should be between 23-30. Your Body mass index is 30.6 kg/m. If this is out of the aforementioned range listed, please consider follow up with your Primary Care Provider.  If you are age 38 or younger, your body mass index should be between 19-25. Your Body mass index is 30.6 kg/m. If this is out of the aformentioned range listed, please consider follow up with your Primary Care Provider.   You have been scheduled for a Barium Esophogram at  [facility] (1st floor of the hospital) on Tuesday, March 27th at 9:30am. Please arrive 15 minutes prior to your appointment for registration. Make certain not to have anything to eat or drink 3 hours prior to your test. If you need to reschedule for any reason, please contact radiology at [phone] to do so. __________________________________________________________________ A barium swallow is an examination that concentrates on views of the esophagus. This tends to be a double contrast exam (barium and two liquids which, when combined, create a gas to distend the wall of the oesophagus) or single contrast (non-ionic iodine based). The study is usually tailored to your symptoms so a good history is essential. Attention is paid during the study to the form, structure and configuration of the esophagus, looking for functional disorders (such as aspiration, dysphagia, achalasia, motility and reflux) EXAMINATION You may be asked to change into a gown, depending on the type of swallow being performed. A radiologist and radiographer will perform the procedure. The radiologist will advise you of the type of contrast selected for your procedure and direct you during the exam. You will be asked to stand, sit or lie in several different positions and to hold a small amount of fluid in your mouth before being asked to swallow while the imaging is performed .In some instances you may be asked to swallow barium coated  marshmallows to assess the motility of a solid food bolus. The exam can be recorded as a digital or video fluoroscopy procedure. POST PROCEDURE It will take 1-2 days for the barium to pass through your system. To facilitate this, it is important, unless otherwise directed, to increase your fluids for the next 24-48hrs and to resume your normal diet.  This test typically takes about 30 minutes to perform. __________________________________________________________________________________  Please follow up with Dr. Carlean Purl on May 11, 2016 at 3:45pm.  Thank you.

## 2016-04-07 NOTE — Progress Notes (Addendum)
Chief Complaint: Dysphagia  HPI:  [name] is a 71 year old Caucasian male with a past medical history of herniated aortic root dilation, AV block, CAD status post bypass graft maintained on Coumadin, depression, diabetes, and multiple others listed below, who was referred to me by Biagio Borg, MD for a complaint of dysphagia .     Patient was previously seen in our clinic in 2015 for the same complaint. He had a barium esophagram which showed frank tracheal aspiration of thick barium. It was recommend patient have a modified barium swallow to evaluate swallowing dysfunction. Patient then had an EGD on 12/01/13 with Dr. Olevia Perches which showed no evidence of esophageal stricture, retained secretions in the esophagus consistent with esophageal dysmotility and irregular appearing squamocolumnar junction. Biopsies consistent with reflux. Patient had modified barium swallow with speech pathology on 12/10/13 which showed aspiration he was put on a regular/thin liquid diet with utilization of strategies to avoid aspiration. It was discussed that he may benefit from an ENT evaluation.    Today, the patient tells me that for the past 6 months off and on he has had trouble with swallowing solids and sometimes liquids. The patient tells me that about 20 days out of the month he will have trouble. He notes that on bad days he can either take a few bites of his food and it comes back up or he can eat a whole meal and it will come back up. He tells me that it feels as though food does not make it down to his stomach. He denies vomiting and describes more of a regurgitation. The patient does not seem to recall workup for this in the past very well, he tells me that what the speech pathologist told him was "bologna". He tells me it does not matter if he chews his food well, drinks sips of water in between or just drinks water, on bad days it will come back up. He does have some abdominal pain after regurgitating but none  proceeding or that lingers. He is edentulous and admits to ill-fitting dentures for the past 6 mos.   Patient denies fever, chills, blood in his stool, melena, weight loss, fatigue, anorexia, nausea, vomiting, change in bowel habits, heartburn, reflux or symptoms that awaken him at night.  Past Medical History:  Diagnosis Date  . Aortic root dilatation ([place]) 02/02/2011  . Arthritis    "lower back; going back down both my sciatic nerves"  . 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 BENIGN PROSTATIC HYPERTROPHY 08/29/2006   takes Flomax daily  . 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 Cataracts, bilateral    immature  . CHF (congestive heart failure) (HCC)    takes Lasix daily  . Chronic back pain    HNP   . CORONARY ARTERY DISEASE 08/29/2006   takes Coumadin daily  . DEPRESSION 08/29/2006  . DIABETES MELLITUS, TYPE II 08/29/2006   takes Metformin,Januvia,and Glipizide  daily  . DIASTOLIC HEART FAILURE, CHRONIC 06/09/2008  . GOUT 04/22/2007   takes Allopurinol daily  . History of migraine    76yr ago  . HYPERLIPIDEMIA 08/29/2006   takes Atorvastatin daily  . HYPERTENSION 08/29/2006   takes Lisinopril daily  . INSOMNIA-SLEEP DISORDER-UNSPEC 10/23/2007  . Left lumbar radiculopathy 05/30/2010  . LUMBAR RADICULOPATHY, RIGHT 06/10/2007  . Muscle spasm    takes Zanaflex daily  . Myocardial infarction 12/27/10   "I've had several MIs"  . 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 PACEMAKER, PERMANENT 03/04/2008  pt denies this date  . Peripheral neuropathy (HCC)    takes Gabapentin daily  . Presence of permanent cardiac pacemaker   . Shortness of breath dyspnea    "all my life" with exertion  . Sleep apnea    "if I lay flat I quit breathing; HOB up I'm fine"    Past Surgical History:  Procedure Laterality Date  . COLONOSCOPY    . CORONARY ANGIOPLASTY WITH STENT PLACEMENT  12/27/10   "I've had a total of 9 cardiac stents put in"  . CORONARY ARTERY BYPASS GRAFT  1992    CABG X 2  . ESOPHAGOGASTRODUODENOSCOPY N/A 12/01/2013   Procedure: ESOPHAGOGASTRODUODENOSCOPY (EGD);  Surgeon: Lafayette Dragon, MD;  Location: [facility];  Service: Endoscopy;  Laterality: N/A;  . INSERT / REPLACE / REMOVE PACEMAKER  ~ 2004   initial pacemaker placement  . INSERT / REPLACE / REMOVE PACEMAKER  10/2009   generator change  . LUMBAR LAMINECTOMY/DECOMPRESSION MICRODISCECTOMY Right 03/23/2014   Procedure: LAMINECTOMY AND FORAMINOTOMY RIGHT LUMBAR THREE-FOUR,LUMBAR FOUR-FIVE, LUMBAR FIVE-SACRAL ONE;  Surgeon: Charlie Pitter, MD;  Location: [facility];  Service: Neurosurgery;  Laterality: Right;  right  . LUMBAR LAMINECTOMY/DECOMPRESSION MICRODISCECTOMY Left 12/01/2014   Procedure: Left Lumbar Three-Four, Lumbar Four-Five Laminectomy and Foraminotomy;  Surgeon: Earnie Larsson, MD;  Location: [facility];  Service: Neurosurgery;  Laterality: Left;  . s/p left arm surgury after work accident  1991   "2000# steel fell on it"  . s/p right hand surgury for foreign object  1970's   "piece of wood went in my hand; had to get that out"    Current Outpatient Prescriptions  Medication Sig Dispense Refill  . allopurinol (ZYLOPRIM) 300 MG tablet take 1 tablet by mouth once daily (Patient taking differently: Take 300 mg by mouth daily. ) 90 tablet 3  . atorvastatin (LIPITOR) 80 MG tablet TAKE 1 TABLET BY MOUTH  DAILY 90 tablet 0  . Blood Glucose Monitoring Suppl (ONE TOUCH ULTRA 2) w/Device KIT Use as directed 1 each 3  . carvedilol (COREG) 25 MG tablet TAKE 1 TABLET BY MOUTH TWO  TIMES DAILY 180 tablet 0  . Cholecalciferol (VITAMIN D) 1000 UNITS capsule Take 1,000 Units by mouth 2 (two) times daily. Reported on 02/15/2015    . donepezil (ARICEPT) 5 MG tablet Take 1 tablet (5 mg total) by mouth at bedtime. 90 tablet 3  . fluocinonide cream (LIDEX) 2.40 % Apply 1 application topically 2 (two) times daily. 30 g 2  . fluticasone (FLONASE) 50 MCG/ACT nasal spray Use 2 sprays in each  nostril every day 48  g 2  . furosemide (LASIX) 40 MG tablet Take 1 tablet by mouth  daily 90 tablet 3  . furosemide (LASIX) 80 MG tablet Take 1 tablet (80 mg total) by mouth 2 (two) times daily. 60 tablet 5  . gabapentin (NEURONTIN) 300 MG capsule TAKE 2 CAPSULES BY MOUTH 3  TIMES DAILY 540 capsule 0  . glipiZIDE (GLUCOTROL XL) 10 MG 24 hr tablet Take 1 tablet by mouth daily.    Marland Kitchen HYDROcodone-acetaminophen (NORCO) 7.5-325 MG tablet Take 1 tablet by mouth every 6 (six) hours as needed for moderate pain. 40 tablet 0  . ibuprofen (ADVIL,MOTRIN) 200 MG tablet Take 800 mg by mouth daily as needed (pain).     Marland Kitchen lisinopril (PRINIVIL,ZESTRIL) 2.5 MG tablet TAKE 1 TABLET BY MOUTH  DAILY 90 tablet 0  . metFORMIN (GLUCOPHAGE) 500 MG tablet TAKE 2 TABLETS BY MOUTH TWO TIMES  DAILY 360 tablet 0  . Multiple Vitamin (MULTIVITAMIN WITH MINERALS) TABS tablet Take 1 tablet by mouth every morning.    . ONE TOUCH ULTRA TEST test strip CHECK BLOOD SUGAR TWO TIMES DAILY 200 each 0  . ONETOUCH DELICA LANCETS 56O MISC As directed    . potassium chloride SA (K-DUR,KLOR-CON) 20 MEQ tablet TAKE 1 TABLET BY MOUTH  EVERY EVENING 90 tablet 0  . predniSONE (DELTASONE) 10 MG tablet 4tab by mouth x 3day,3tab x 3day,2tab x 3 day,1 tab x 3 day 30 tablet 0  . sitaGLIPtin (JANUVIA) 100 MG tablet Take 1 tablet (100 mg total) by mouth daily. 90 tablet 3  . tamsulosin (FLOMAX) 0.4 MG CAPS capsule TAKE 1 CAPSULE BY MOUTH  EVERY 12 HOURS 180 capsule 0  . tiZANidine (ZANAFLEX) 4 MG tablet TAKE 1 TABLET BY MOUTH  EVERY 6 HOURS AS NEEDED FOR MUSCLE SPASMS. 180 tablet 3  . traMADol (ULTRAM) 50 MG tablet Take 1 tablet (50 mg total) by mouth every 8 (eight) hours as needed. 90 tablet 1  . warfarin (COUMADIN) 5 MG tablet TAKE 1 TABLET BY MOUTH DAILY OR AS DIRECTED BY COUMADIN CLINIC 90 tablet 1   No current facility-administered medications for this visit.     Allergies as of 04/07/2016 - Review Complete 04/07/2016  Allergen Reaction Noted  . Crestor  [rosuvastatin calcium] Other (See Comments) 12/27/2010  . Latex Rash and Other (See Comments) 09/20/2015    Family History  Problem Relation Age of Onset  . Diabetes Mother   . Diabetes Sister   . Heart disease Sister     2 sister died with heart disease  . Coronary artery disease Other 32    male, first degree relative  . Diabetes Other     1st degree relative  . Heart disease Sister   . Lung cancer Sister     deceased    Social History   Social History  . Marital status: Married    Spouse name: N/A  . Number of children: 2  . Years of education: N/A   Occupational History  . prior work Nature conservation officer  . disabled since 2004    Social History Main Topics  . Smoking status: Former Smoker    Packs/day: 3.00    Years: 9.00    Types: Cigarettes    Quit date: 02/26/1973  . Smokeless tobacco: Former Systems developer     Comment: quit smoking 89yr ago  . Alcohol use No  . Drug use: No     Comment: "used pouches of tobacco for a long time; quit those 12/09/1970"  . Sexual activity: Yes   Other Topics Concern  . Not on file   Social History Narrative  . No narrative on file    Review of Systems:    Constitutional: No weight loss, fever or chills Skin: No rash  Cardiovascular: No chest pain Respiratory: No SOB Gastrointestinal: See HPI and otherwise negative Genitourinary: No dysuria Neurological: No headache Musculoskeletal: No new muscle or joint pain Hematologic: No bleeding  Psychiatric: No history of depression or anxiety   Physical Exam:  Vital signs: BP 134/62   Pulse 87   Ht '5\' 11"'  (1.803 m)   Wt 219 lb 6.4 oz (99.5 kg)   SpO2 95%   BMI 30.60 kg/m   Constitutional:   Pleasant overweight Caucasian male appears to be in NAD, Well developed, Well nourished, alert and cooperative Head:  Normocephalic and atraumatic. Eyes:   PEERL, EOMI. No  icterus. Conjunctiva pink. Ears:  Normal auditory acuity. Neck:  Supple Throat: Oral cavity and pharynx  without inflammation, swelling or lesion. Edentulous Respiratory: Respirations even and unlabored. Lungs clear to auscultation bilaterally.   No wheezes, crackles, or rhonchi.  Cardiovascular: Normal S1, S2. No MRG. Regular rate and rhythm. No peripheral edema, cyanosis or pallor.  Gastrointestinal:  Soft, nondistended, nontender. No rebound or guarding. Normal bowel sounds. No appreciable masses or hepatomegaly. Rectal:  Not performed.  Msk:  Symmetrical without gross deformities. Without edema, no deformity or joint abnormality.  Neurologic:  Alert and  oriented x4;  grossly normal neurologically.  Skin:   Dry and intact without significant lesions or rashes. Psychiatric:  Demonstrates good judgement and reason without abnormal affect or behaviors.  MOST RECENT LABS: CBC    Component Value Date/Time   WBC 4.8 03/28/2016 0957   RBC 4.21 (L) 03/28/2016 0957   HGB 11.3 (L) 03/28/2016 0957   HCT 34.7 (L) 03/28/2016 0957   PLT 141.0 (L) 03/28/2016 0957   MCV 82.5 03/28/2016 0957   MCH 27.0 09/20/2015 1301   MCHC 32.6 03/28/2016 0957   RDW 18.0 (H) 03/28/2016 0957   LYMPHSABS 1.4 03/28/2016 0957   MONOABS 0.5 03/28/2016 0957   EOSABS 0.2 03/28/2016 0957   BASOSABS 0.0 03/28/2016 0957    CMP     Component Value Date/Time   NA 142 03/28/2016 0957   K 5.7 (H) 03/28/2016 0957   CL 108 03/28/2016 0957   CO2 28 03/28/2016 0957   GLUCOSE 113 (H) 03/28/2016 0957   BUN 12 03/28/2016 0957   CREATININE 0.89 03/28/2016 0957   CREATININE 0.83 11/27/2013 0933   CALCIUM 9.5 03/28/2016 0957   PROT 6.7 03/28/2016 0957   ALBUMIN 4.0 03/28/2016 0957   AST 29 03/28/2016 0957   ALT 36 03/28/2016 0957   ALKPHOS 88 03/28/2016 0957   BILITOT 0.4 03/28/2016 0957   GFRNONAA >60 09/20/2015 1301   GFRNONAA >89 11/27/2013 0933   GFRAA >60 09/20/2015 1301   GFRAA >89 11/27/2013 0933    Assessment: 1. Dysphagia: Previous workup in 2015 showed impaired motility, patient now with increasing symptoms  over the past 6 months, edentulous state likely contributing; consider esophageal dysmotility versus stricture versus ring versus web versus mass versus element of gastroparesis?  Plan: 1. Ordered a barium swallow study with tablet. 2. Patient was advised of anti-dysphagia measures including taking small bites, taking sips of water between bites, avoiding distraction while eating and the chin tuck technique. 3. Patient will follow up in clinic as directed after imaging above with either Dr. Carlean Purl or myself  Ellouise Newer, PA-C [facility] 04/07/2016, 9:10 AM  Cc: Biagio Borg, MD   Agree with Ms. Lemmon's evaluation and management. Gatha Mayer, MD, [facility]

## 2016-04-11 ENCOUNTER — Telehealth: Payer: Self-pay

## 2016-04-11 ENCOUNTER — Ambulatory Visit: Payer: Medicare Other | Admitting: Internal Medicine

## 2016-04-11 DIAGNOSIS — R1310 Dysphagia, unspecified: Secondary | ICD-10-CM

## 2016-04-11 NOTE — Telephone Encounter (Signed)
-----   Message from Levin Erp, [place] sent at 04/11/2016  1:40 PM EDT ----- Regarding: FW: ? do MBS again first? That sounds like a good plan. Kiley Solimine, can you set him up for MBS first before barium esophagram.  Thank you JL ----- Message ----- From: Gatha Mayer, MD Sent: 04/11/2016   1:25 PM To: Levin Erp, PA Subject: ? do MBS again first?                          Am wondering if we should have him do MBS again as want to make sure not aspirating again before ba swallow  Didn't quite sound like pharyngeal issue but think we would want to be clear that he can tolerate liquids ok before dumping Ba down there  I wasn't there so if you think he will be ok we can proceed with the scheduled Ba swallow - just something to think about  carl

## 2016-04-12 ENCOUNTER — Encounter: Payer: Self-pay | Admitting: Internal Medicine

## 2016-04-12 ENCOUNTER — Ambulatory Visit (INDEPENDENT_AMBULATORY_CARE_PROVIDER_SITE_OTHER): Payer: Medicare Other | Admitting: Internal Medicine

## 2016-04-12 VITALS — BP 132/86 | HR 83 | Temp 97.6°F | Ht 71.0 in | Wt 228.0 lb

## 2016-04-12 DIAGNOSIS — E1140 Type 2 diabetes mellitus with diabetic neuropathy, unspecified: Secondary | ICD-10-CM | POA: Diagnosis not present

## 2016-04-12 DIAGNOSIS — E1165 Type 2 diabetes mellitus with hyperglycemia: Secondary | ICD-10-CM | POA: Diagnosis not present

## 2016-04-12 DIAGNOSIS — IMO0002 Reserved for concepts with insufficient information to code with codable children: Secondary | ICD-10-CM

## 2016-04-12 DIAGNOSIS — M109 Gout, unspecified: Secondary | ICD-10-CM | POA: Insufficient documentation

## 2016-04-12 DIAGNOSIS — I10 Essential (primary) hypertension: Secondary | ICD-10-CM | POA: Diagnosis not present

## 2016-04-12 MED ORDER — PREDNISONE 10 MG PO TABS: ORAL_TABLET | ORAL | 0 refills | Status: DC

## 2016-04-12 MED ORDER — METHYLPREDNISOLONE ACETATE 80 MG/ML IJ SUSP
80.0000 mg | Freq: Once | INTRAMUSCULAR | Status: AC
  Administered 2016-04-12: 80 mg via INTRAMUSCULAR

## 2016-04-12 MED ORDER — HYDROCODONE-ACETAMINOPHEN 7.5-325 MG PO TABS: 1.0000 | ORAL_TABLET | Freq: Four times a day (QID) | ORAL | 0 refills | Status: DC | PRN

## 2016-04-12 NOTE — Progress Notes (Signed)
Subjective:    Patient ID: [name], male    DOB: 04-27-1945, 71 y.o.   MRN: [id number]  HPI    Here with 4 days onset gradually worsening now mod to severe right ankl and left first MTP redness, swelling and pain, without fever, trauma, but seems similar to episode in jan 2018 resolved with steroid tx. Pt denies chest pain, increased sob or doe, wheezing, orthopnea, PND, increased LE swelling, palpitations, dizziness or syncope.  Pt denies new neurological symptoms such as new headache, or facial or extremity weakness or numbness   Pt denies polydipsia, polyuria, or low sugar symptoms such as weakness or confusion improved with po intake.  Pt states overall good compliance with meds, trying to follow lower cholesterol, diabetic diet, wt overall stable but little exercise however.    CBG's 96-140 per pt.   Past Medical History:  Diagnosis Date  . Aortic root dilatation ([place]) 02/02/2011  . Arthritis    "lower back; going back down both my sciatic nerves"  . 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 BENIGN PROSTATIC HYPERTROPHY 08/29/2006   takes Flomax daily  . 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 Cataracts, bilateral    immature  . CHF (congestive heart failure) (HCC)    takes Lasix daily  . Chronic back pain    HNP   . CORONARY ARTERY DISEASE 08/29/2006   takes Coumadin daily  . DEPRESSION 08/29/2006  . DIABETES MELLITUS, TYPE II 08/29/2006   takes Metformin,Januvia,and Glipizide  daily  . DIASTOLIC HEART FAILURE, CHRONIC 06/09/2008  . GOUT 04/22/2007   takes Allopurinol daily  . History of migraine    82yr ago  . HYPERLIPIDEMIA 08/29/2006   takes Atorvastatin daily  . HYPERTENSION 08/29/2006   takes Lisinopril daily  . INSOMNIA-SLEEP DISORDER-UNSPEC 10/23/2007  . Left lumbar radiculopathy 05/30/2010  . LUMBAR RADICULOPATHY, RIGHT 06/10/2007  . Muscle spasm    takes Zanaflex daily  . Myocardial infarction 12/27/10   "I've had several MIs"  . NEOPLASM, MALIGNANT,  PROSTATE 11/26/2006  . PACEMAKER, PERMANENT 03/04/2008   pt denies this date  . Peripheral neuropathy (HCC)    takes Gabapentin daily  . Presence of permanent cardiac pacemaker   . Shortness of breath dyspnea    "all my life" with exertion  . Sleep apnea    "if I lay flat I quit breathing; HOB up I'm fine"   Past Surgical History:  Procedure Laterality Date  . COLONOSCOPY    . CORONARY ANGIOPLASTY WITH STENT PLACEMENT  12/27/10   "I've had a total of 9 cardiac stents put in"  . CORONARY ARTERY BYPASS GRAFT  1992   CABG X 2  . ESOPHAGOGASTRODUODENOSCOPY N/A 12/01/2013   Procedure: ESOPHAGOGASTRODUODENOSCOPY (EGD);  Surgeon: DLafayette Dragon MD;  [facility];  Service: Endoscopy;  Laterality: N/A;  . INSERT / REPLACE / REMOVE PACEMAKER  ~ 2004   initial pacemaker placement  . INSERT / REPLACE / REMOVE PACEMAKER  10/2009   generator change  . LUMBAR LAMINECTOMY/DECOMPRESSION MICRODISCECTOMY Right 03/23/2014   Procedure: LAMINECTOMY AND FORAMINOTOMY RIGHT LUMBAR THREE-FOUR,LUMBAR FOUR-FIVE, LUMBAR FIVE-SACRAL ONE;  Surgeon: HCharlie Pitter MD;  [facility];  Service: Neurosurgery;  Laterality: Right;  right  . LUMBAR LAMINECTOMY/DECOMPRESSION MICRODISCECTOMY Left 12/01/2014   Procedure: Left Lumbar Three-Four, Lumbar Four-Five Laminectomy and Foraminotomy;  Surgeon: HEarnie Larsson MD;  [facility];  Service: Neurosurgery;  Laterality: Left;  . s/p left arm surgury after work accident  1991   "  2000# steel fell on it"  . s/p right hand surgury for foreign object  1970's   "piece of wood went in my hand; had to get that out"    reports that he quit smoking about 43 years ago. His smoking use included Cigarettes. He has a 27.00 pack-year smoking history. He has quit using smokeless tobacco. He reports that he does not drink alcohol or use drugs. family history includes Coronary artery disease (age of onset: 33) in his other; Diabetes in his mother, other, and sister;  Heart disease in his sister and sister; Lung cancer in his sister. Allergies  Allergen Reactions  . Crestor [Rosuvastatin Calcium] Other (See Comments)    Urination of blood  . Latex Rash and Other (See Comments)    Gloves cause welts on hands!!   Current Outpatient Prescriptions on File Prior to Visit  Medication Sig Dispense Refill  . allopurinol (ZYLOPRIM) 300 MG tablet take 1 tablet by mouth once daily (Patient taking differently: Take 300 mg by mouth daily. ) 90 tablet 3  . atorvastatin (LIPITOR) 80 MG tablet TAKE 1 TABLET BY MOUTH  DAILY 90 tablet 0  . Blood Glucose Monitoring Suppl (ONE TOUCH ULTRA 2) w/Device KIT Use as directed 1 each 3  . carvedilol (COREG) 25 MG tablet TAKE 1 TABLET BY MOUTH TWO  TIMES DAILY 180 tablet 0  . Cholecalciferol (VITAMIN D) 1000 UNITS capsule Take 1,000 Units by mouth 2 (two) times daily. Reported on 02/15/2015    . donepezil (ARICEPT) 5 MG tablet Take 1 tablet (5 mg total) by mouth at bedtime. 90 tablet 3  . fluocinonide cream (LIDEX) 8.67 % Apply 1 application topically 2 (two) times daily. 30 g 2  . fluticasone (FLONASE) 50 MCG/ACT nasal spray Use 2 sprays in each  nostril every day 48 g 2  . furosemide (LASIX) 40 MG tablet Take 1 tablet by mouth  daily 90 tablet 3  . furosemide (LASIX) 80 MG tablet Take 1 tablet (80 mg total) by mouth 2 (two) times daily. 60 tablet 5  . gabapentin (NEURONTIN) 300 MG capsule TAKE 2 CAPSULES BY MOUTH 3  TIMES DAILY 540 capsule 0  . glipiZIDE (GLUCOTROL XL) 10 MG 24 hr tablet Take 1 tablet by mouth daily.    Marland Kitchen ibuprofen (ADVIL,MOTRIN) 200 MG tablet Take 800 mg by mouth daily as needed (pain).     Marland Kitchen lisinopril (PRINIVIL,ZESTRIL) 2.5 MG tablet TAKE 1 TABLET BY MOUTH  DAILY 90 tablet 0  . metFORMIN (GLUCOPHAGE) 500 MG tablet TAKE 2 TABLETS BY MOUTH TWO TIMES DAILY 360 tablet 0  . Multiple Vitamin (MULTIVITAMIN WITH MINERALS) TABS tablet Take 1 tablet by mouth every morning.    . ONE TOUCH ULTRA TEST test strip CHECK BLOOD  SUGAR TWO TIMES DAILY 200 each 0  . ONETOUCH DELICA LANCETS 61P MISC As directed    . potassium chloride SA (K-DUR,KLOR-CON) 20 MEQ tablet TAKE 1 TABLET BY MOUTH  EVERY EVENING 90 tablet 0  . sitaGLIPtin (JANUVIA) 100 MG tablet Take 1 tablet (100 mg total) by mouth daily. 90 tablet 3  . tamsulosin (FLOMAX) 0.4 MG CAPS capsule TAKE 1 CAPSULE BY MOUTH  EVERY 12 HOURS 180 capsule 0  . tiZANidine (ZANAFLEX) 4 MG tablet TAKE 1 TABLET BY MOUTH  EVERY 6 HOURS AS NEEDED FOR MUSCLE SPASMS. 180 tablet 3  . traMADol (ULTRAM) 50 MG tablet Take 1 tablet (50 mg total) by mouth every 8 (eight) hours as needed. 90 tablet 1  .  warfarin (COUMADIN) 5 MG tablet TAKE 1 TABLET BY MOUTH DAILY OR AS DIRECTED BY COUMADIN CLINIC 90 tablet 1   No current facility-administered medications on file prior to visit.    Review of Systems  Constitutional: Negative for unusual diaphoresis or night sweats HENT: Negative for ear swelling or discharge Eyes: Negative for worsening visual haziness  Respiratory: Negative for choking and stridor.   Gastrointestinal: Negative for distension or worsening eructation Genitourinary: Negative for retention or change in urine volume.  Musculoskeletal: Negative for other MSK pain or swelling Skin: Negative for color change and worsening wound Neurological: Negative for tremors and numbness other than noted  Psychiatric/Behavioral: Negative for decreased concentration or agitation other than above   All other system neg per pt    Objective:   Physical Exam BP 132/86   Pulse 83   Temp 97.6 F (36.4 C)   Ht 5' 11" (1.803 m)   Wt 228 lb (103.4 kg)   SpO2 99%   BMI 31.80 kg/m  VS noted,  Constitutional: Pt appears in no apparent distress HENT: Head: NCAT.  Right Ear: External ear normal.  Left Ear: External ear normal.  Eyes: . Pupils are equal, round, and reactive to light. Conjunctivae and EOM are normal Neck: Normal range of motion. Neck supple.  Cardiovascular: Normal rate  and regular rhythm.   Pulmonary/Chest: Effort normal and breath sounds without rales or wheezing.  Right ankle and left foot first MTP  with 2-3+ red/tender/selling   Neurological: Pt is alert. Not confused , motor grossly intact Skin: Skin is warm. No rash, no LE edema Psychiatric: Pt behavior is normal. No agitation.  No other exam findings    Assessment & Plan:

## 2016-04-12 NOTE — Patient Instructions (Addendum)
You had the steroid shot today  Please take all new medication as prescribed - the prednisone, and pain medication  Please continue all other medications as before, including the allopurinol  Please have the pharmacy call with any other refills you may need.  Please keep your appointments with your specialists as you may have planned

## 2016-04-13 ENCOUNTER — Other Ambulatory Visit (HOSPITAL_COMMUNITY): Payer: Self-pay | Admitting: Physician Assistant

## 2016-04-13 DIAGNOSIS — R1319 Other dysphagia: Secondary | ICD-10-CM

## 2016-04-13 NOTE — Telephone Encounter (Signed)
The pt has been notified and instructed.  He verbalized understanding of the instructions.    You have been scheduled for a modified barium swallow on 04/20/16 at 1 pm. Please arrive 15 minutes prior to your test for registration. You will go to [facility] (1st Floor) for your appointment.  Should you need to cancel or reschedule your appointment, please contact [phone] [facility] [place]) or [phone] [place]). ______________________________________________ A Modified Barium Swallow Study, or MBS, is a special x-ray that is taken to check swallowing skills. It is carried out by a Stage manager and a Psychologist, clinical (SLP). During this test, yourmouth, throat, and esophagus, a muscular tube which connects your mouth to your stomach, is checked. The test will help you, your doctor, and the SLP plan what types of foods and liquids are easier for you to swallow. The SLP will also identify positions and ways to help you swallow more easily and safely. What will happen during an MBS? You will be taken to an x-ray room and seated comfortably. You will be asked to swallow small amounts of food and liquid mixed with barium. Barium is a liquid or paste that allows images of your mouth, throat and esophagus to be seen on x-ray. The x-ray captures moving images of the food you are swallowing as it travels from your mouth through your throat and into your esophagus. This test helps identify whether food or liquid is entering your lungs (aspiration). The test also shows which part of your mouth or throat lacks strength or coordination to move the food or liquid in the right direction. This test typically takes 30 minutes to 1 hour to complete. ______________________________________________

## 2016-04-14 ENCOUNTER — Other Ambulatory Visit (HOSPITAL_COMMUNITY): Payer: Self-pay | Admitting: Physician Assistant

## 2016-04-14 DIAGNOSIS — R1310 Dysphagia, unspecified: Secondary | ICD-10-CM

## 2016-04-16 NOTE — Assessment & Plan Note (Signed)
Mod to severe, for depomedrol IM 80, predpac asd, pain control,  to f/u any worsening symptoms or concerns

## 2016-04-16 NOTE — Assessment & Plan Note (Signed)
stable overall by history and exam, recent data reviewed with pt, and pt to continue medical treatment as before,  to f/u any worsening symptoms or concerns BP Readings from Last 3 Encounters:  04/12/16 132/86  04/07/16 134/62  03/31/16 132/76

## 2016-04-16 NOTE — Assessment & Plan Note (Signed)
stable overall by history and exam, recent data reviewed with pt, and pt to continue medical treatment as before,  to f/u any worsening symptoms or concerns Lab Results  Component Value Date   HGBA1C 7.8 (H) 03/28/2016  pt to call for cbg > 200 or onset polys with steroid tx

## 2016-04-17 ENCOUNTER — Other Ambulatory Visit: Payer: Self-pay | Admitting: *Deleted

## 2016-04-17 NOTE — Patient Outreach (Addendum)
[facility]) Care Management   04/17/2016  [name] 06/10/1945 [id number]  [name] is an 71 y.o. male  With history of complete heart block s/p permanent pacemaker, HTN, dyslipidemia, CAD s/p multiple stents and CABG, and DM. [name] is followed in the [facility] and is seen by Dr. Cristopher Peru.   [facility] Care Management is seeing [name] for DM and HTN disease management and care coordination services.   [name] has been noted recently with dysphagia issues and on 04/12/16 with pain of bilateral feet which is being treated as gout by his primary care provider with steroids.   Subjective:  "I doing better but my feet were hurting", " I had gout" " I still have trouble swallowing but have tests coming up"   Objective: Pulse 65   SpO2 96%   cbg 195   Review of Systems  Constitutional: Negative for chills, diaphoresis, fever, malaise/fatigue and weight loss.  HENT: Positive for congestion . Negative for ear discharge, ear pain, hearing loss, nosebleeds, sinus pain, sore throat and tinnitus.        Noted with swallowing concerns, no cough, Lots of secretions, sound congested with speaking   Eyes: Negative for blurred vision, double vision, photophobia, pain, discharge and redness.  Respiratory: Positive for sputum production. Negative for cough, hemoptysis, shortness of breath and wheezing.        Secretions clear and at times regurgitation of soft food.   Cardiovascular: Negative for chest pain, palpitations, orthopnea, claudication and leg swelling.  Gastrointestinal: Negative for heartburn, nausea and vomiting.  Genitourinary: Negative for dysuria, flank pain, frequency, hematuria and urgency.  Musculoskeletal: Positive for joint pain and myalgias. Negative for back pain, falls and neck pain.  Skin: Negative for itching and rash.  Neurological: Negative for dizziness, tingling, tremors, sensory change, speech  change, focal weakness, seizures, loss of consciousness, weakness and headaches.  Endo/Heme/Allergies: Negative for environmental allergies and polydipsia. Does not bruise/bleed easily.  Psychiatric/Behavioral: Negative for depression, hallucinations, memory loss, substance abuse and suicidal ideas. The patient is not nervous/anxious and does not have insomnia.     Physical Exam  Constitutional: He appears well-developed and well-nourished.  HENT:  Head: Normocephalic.  Eyes: Conjunctivae are normal. Pupils are equal, round, and reactive to light.  Cardiovascular: Normal rate.   Respiratory: No respiratory distress. He has no wheezes. He has no rales. He exhibits no tenderness.  GI: Soft. Bowel sounds are normal. He exhibits no distension and no mass. There is no tenderness. There is no rebound and no guarding.    Encounter Medications:   Outpatient Encounter Prescriptions as of 04/17/2016  Medication Sig Note  . allopurinol (ZYLOPRIM) 300 MG tablet take 1 tablet by mouth once daily (Patient taking differently: Take 300 mg by mouth daily. )   . atorvastatin (LIPITOR) 80 MG tablet TAKE 1 TABLET BY MOUTH  DAILY   . Blood Glucose Monitoring Suppl (ONE TOUCH ULTRA 2) w/Device KIT Use as directed   . carvedilol (COREG) 25 MG tablet TAKE 1 TABLET BY MOUTH TWO  TIMES DAILY   . Cholecalciferol (VITAMIN D) 1000 UNITS capsule Take 1,000 Units by mouth 2 (two) times daily. Reported on 02/15/2015   . donepezil (ARICEPT) 5 MG tablet Take 1 tablet (5 mg total) by mouth at bedtime.   . fluocinonide cream (LIDEX) 2.95 % Apply 1 application topically 2 (two) times daily.   . fluticasone (FLONASE) 50 MCG/ACT nasal spray Use 2 sprays  in each  nostril every day 09/20/2015: Kept on hand and used as needed  . furosemide (LASIX) 40 MG tablet Take 1 tablet by mouth  daily   . furosemide (LASIX) 80 MG tablet Take 1 tablet (80 mg total) by mouth 2 (two) times daily. 05/24/2015: .  . gabapentin (NEURONTIN) 300 MG  capsule TAKE 2 CAPSULES BY MOUTH 3  TIMES DAILY   . glipiZIDE (GLUCOTROL XL) 10 MG 24 hr tablet Take 1 tablet by mouth daily.   Marland Kitchen HYDROcodone-acetaminophen (NORCO) 7.5-325 MG tablet Take 1 tablet by mouth every 6 (six) hours as needed for moderate pain.   Marland Kitchen ibuprofen (ADVIL,MOTRIN) 200 MG tablet Take 800 mg by mouth daily as needed (pain).  09/20/2015: Kept on hand and used as needed  . lisinopril (PRINIVIL,ZESTRIL) 2.5 MG tablet TAKE 1 TABLET BY MOUTH  DAILY   . metFORMIN (GLUCOPHAGE) 500 MG tablet TAKE 2 TABLETS BY MOUTH TWO TIMES DAILY   . Multiple Vitamin (MULTIVITAMIN WITH MINERALS) TABS tablet Take 1 tablet by mouth every morning.   . ONE TOUCH ULTRA TEST test strip CHECK BLOOD SUGAR TWO TIMES DAILY   . ONETOUCH DELICA LANCETS 90S MISC As directed   . predniSONE (DELTASONE) 10 MG tablet 4tab by mouth x 3day,3tab x 3day,2tab x 3 day,1 tab x 3 day   . sitaGLIPtin (JANUVIA) 100 MG tablet Take 1 tablet (100 mg total) by mouth daily.   . tamsulosin (FLOMAX) 0.4 MG CAPS capsule TAKE 1 CAPSULE BY MOUTH  EVERY 12 HOURS   . tiZANidine (ZANAFLEX) 4 MG tablet TAKE 1 TABLET BY MOUTH  EVERY 6 HOURS AS NEEDED FOR MUSCLE SPASMS.   Marland Kitchen traMADol (ULTRAM) 50 MG tablet Take 1 tablet (50 mg total) by mouth every 8 (eight) hours as needed.   . warfarin (COUMADIN) 5 MG tablet TAKE 1 TABLET BY MOUTH DAILY OR AS DIRECTED BY COUMADIN CLINIC   . potassium chloride SA (K-DUR,KLOR-CON) 20 MEQ tablet TAKE 1 TABLET BY MOUTH  EVERY EVENING    Assessment:    Diabetes- Continues to manage DM with diet and medication.  Recently placed on tapering dose of Prednisone for gout.  Pt is aware that cbgs may be elevated with use of Steroid.    Dysphagia - Reviewed upcoming tests, dates and described the procedures to [name]. He denies coughing but has lots of secretions and regurgitates soft food at intervals.  Pt noted with garbled speech on assessment.   He has a MBS on 04/20/16 at [facility] and a Esophagus DG on 04/25/16 at  0930 at [facility].  He is then scheduled to follow up with Dr Carlean Purl on 05/11/16 at 1545  Pain of Lower extremities - reviewed newly prescribed medications and signs/symptoms of worsening gout/when to contact provider     Plan:  Will complete a home visit to Randall Dunklee in 2 weeks after swallowing tests completed. He agreed to be seen during the first 2 weeks of April 2018. Pt will be called with appointment date   [facility] CM Care Plan Problem One     Most Recent Value  Care Plan Problem One  Acute/Chronic Health Condition (dysphagia, early satiety)  Role Documenting the Problem One  Care Management Coordinator  Care Plan for Problem One  Active  THN Long Term Goal (31-90 days)  Over the next 60 days, patient will verbalize understanding of plan of care for treatment of dysphagia and early satiety  THN Long Term Goal Start Date  03/20/16  Interventions for Problem One Long Term Goal  In depth interview with patient re: symptoms,  discussed need for provider input, review scheduled swallowing appointments   THN CM Short Term Goal #1 (0-30 days)  Over the next 10 days, patient will attend scheduled provider appointment  [facility] CM Short Term Goal #1 Start Date  03/20/16  [facility] CM Short Term Goal #1 Met Date  04/17/16  Interventions for Short Term Goal #1  Provided assistance for patient contact provider office to request appointment  THN CM Short Term Goal #2 (0-30 days)  Over the next 14 days, patient will attend scheduled diagnostic procedures  THN CM Short Term Goal #2 Start Date  04/17/16  Interventions for Short Term Goal #2  reviewed scheduled diagnostic procedures with patient/spouse,  discussed details and addressed questions regarding specific procedures  THN CM Short Term Goal #3 (0-30 days)  over the next 30 days, patient will call provider for new/worsened symptoms  THN CM Short Term Goal #3 Start Date  04/17/16  Interventions for Short Tern Goal #3  reviewed signs of worsening  condition warranting call to provider    [facility] CM Care Plan Problem Two     Most Recent Value  Care Plan Problem Two  Acute/Chronic Health Condition (hypoglycemia)  Role Documenting the Problem Two  Care Management [place] for Problem Two  Not Active  Interventions for Problem Two Long Term Goal   Reviewed cbg record,  discussed dietary intake,  reviewed and discussed medication management  THN Long Term Goal (31-90) days  Over the next 60 days, patient willl report understanding of plan of care for treatment of hypoglycemia  THN Long Term Goal Start Date  03/20/16  THN Long Term Goal Met Date  04/17/16  THN CM Short Term Goal #1 (0-30 days)  Over the next 30 days, patient will report intake of balanced meals three times daily (as per prescribed carb modiified dietary guidelines)  THN CM Short Term Goal #1 Start Date  03/20/16  [facility] CM Short Term Goal #1 Met Date   04/17/16  Interventions for Short Term Goal #2   Reviewed need for protein with each meal,  discussed new symptom: dysphagia and early satiety,  Emmi educational materials prescribed   THN CM Short Term Goal #2 (0-30 days)  Over the next 10 days, patient will attend provider appointment as scheduled  THN CM Short Term Goal #2 Start Date  03/20/16  [facility] CM Short Term Goal #2 Met Date  04/17/16  Interventions for Short Term Goal #2  assisted patient with scheduling provider appointment  [facility] CM Short Term Goal #3 (0-30 days)  Over the next 30 days, patient will verbalize understanding of signs and symptoms of EARLY hypoglycemia  THN CM Short Term Goal #3 Start Date  03/20/16  [facility] CM Short Term Goal #3 Met Date  04/17/16  Interventions for Short Term Goal #3  Emmi educational materials prescribed     Barbaraann Faster, RN, BSN, [place], [facility] [place] Management  [phone]

## 2016-04-20 ENCOUNTER — Ambulatory Visit (HOSPITAL_COMMUNITY)
Admission: RE | Admit: 2016-04-20 | Discharge: 2016-04-20 | Disposition: A | Discharge status: Home / Self Care | Payer: Medicare Other | Source: Ambulatory Visit | Attending: Physician Assistant | Admitting: Physician Assistant

## 2016-04-20 ENCOUNTER — Ambulatory Visit (HOSPITAL_COMMUNITY): Payer: Medicare Other

## 2016-04-20 DIAGNOSIS — R1310 Dysphagia, unspecified: Secondary | ICD-10-CM | POA: Insufficient documentation

## 2016-04-20 DIAGNOSIS — K224 Dyskinesia of esophagus: Secondary | ICD-10-CM | POA: Diagnosis not present

## 2016-04-20 DIAGNOSIS — R1319 Other dysphagia: Secondary | ICD-10-CM

## 2016-04-25 ENCOUNTER — Ambulatory Visit (HOSPITAL_COMMUNITY): Payer: Medicare Other

## 2016-04-26 ENCOUNTER — Ambulatory Visit: Payer: Medicare Other | Admitting: Physician Assistant

## 2016-04-28 ENCOUNTER — Ambulatory Visit (INDEPENDENT_AMBULATORY_CARE_PROVIDER_SITE_OTHER): Payer: Medicare Other | Admitting: Pharmacist Clinician (PhC)/ Clinical Pharmacy Specialist

## 2016-04-28 DIAGNOSIS — I483 Typical atrial flutter: Secondary | ICD-10-CM | POA: Diagnosis not present

## 2016-04-28 DIAGNOSIS — I482 Chronic atrial fibrillation, unspecified: Secondary | ICD-10-CM

## 2016-04-28 DIAGNOSIS — Z7901 Long term (current) use of anticoagulants: Secondary | ICD-10-CM | POA: Diagnosis not present

## 2016-04-28 LAB — POCT INR: INR: 2.5

## 2016-05-02 ENCOUNTER — Ambulatory Visit: Payer: Medicare Other | Admitting: *Deleted

## 2016-05-04 ENCOUNTER — Other Ambulatory Visit: Payer: Self-pay | Admitting: *Deleted

## 2016-05-04 NOTE — Patient Outreach (Signed)
[name]) Care Management   05/04/2016  [name] 1945-06-25 [id number]  [name] is an 71 y.o. male with history of complete heart block s/p permanent pacemaker, HTN, dyslipidemia, CAD s/p multiple stents and CABG, and DM. [name] is followed in the [facility] [place] [facility] and is seen by Dr. Cristopher Peru.   [facility] Care Management is seeing [name] for DM and HTN disease management and care coordination services.    Subjective: "I got sick suddenly" "My left knee hurts"  Objective:  BP 124/70   Pulse (!) 56   SpO2 94%   Noted use of cane for ambulation    Review of Systems  Constitutional: Positive for malaise/fatigue. Negative for chills, diaphoresis, fever and weight loss.  HENT: Negative for congestion, ear discharge, ear pain, hearing loss, nosebleeds, sinus pain, sore throat and tinnitus.   Eyes: Negative for blurred vision, double vision, photophobia, pain, discharge and redness.  Respiratory: Positive for cough and wheezing. Negative for hemoptysis, sputum production, shortness of breath and stridor.        Noted coughing at intervals with some wheezing heard on the right upper lung lobe with expiration. Noted garbled speech as if increased sputum in mouth or throat. States minimal clear sputum  Cardiovascular: Positive for leg swelling. Negative for chest pain, palpitations, orthopnea, claudication and PND.  Gastrointestinal: Negative.  Negative for abdominal pain, blood in stool, constipation, diarrhea, heartburn, melena, nausea and vomiting.  Genitourinary: Negative.  Negative for dysuria, frequency and urgency.  Musculoskeletal: Positive for joint pain and myalgias. Negative for back pain, falls and neck pain.  Skin: Negative for itching and rash.  Neurological: Positive for weakness. Negative for dizziness, tingling, tremors, sensory change, speech change, focal weakness, seizures, loss of consciousness and  headaches.  Endo/Heme/Allergies: Negative for environmental allergies and polydipsia. Bruises/bleeds easily.  Psychiatric/Behavioral: Negative.  Negative for depression, hallucinations, memory loss, substance abuse and suicidal ideas. The patient is not nervous/anxious and does not have insomnia.     Physical Exam  Constitutional: He is oriented to person, place, and time. He appears well-developed and well-nourished.  HENT:  Head: Normocephalic and atraumatic.  Right Ear: External ear normal.  Left Ear: External ear normal.  Eyes: Conjunctivae and EOM are normal. Pupils are equal, round, and reactive to light.  Neck: Normal range of motion. Neck supple.  Cardiovascular: Normal rate, regular rhythm, normal heart sounds and intact distal pulses.   Respiratory: Effort normal and breath sounds normal.  GI: Soft. Bowel sounds are normal.  Musculoskeletal:       Left knee: He exhibits decreased range of motion, swelling, erythema and bony tenderness. Tenderness found.  Neurological: He is alert and oriented to person, place, and time.  Skin: Skin is warm and dry. No rash noted. There is erythema. No pallor.  Left knee swollen, red, painful to touch- states symptoms came on suddenly   Psychiatric: His behavior is normal. Judgment and thought content normal.    Encounter Medications:   Outpatient Encounter Prescriptions as of 05/04/2016  Medication Sig Note  . allopurinol (ZYLOPRIM) 300 MG tablet take 1 tablet by mouth once daily (Patient taking differently: Take 300 mg by mouth daily. )   . atorvastatin (LIPITOR) 80 MG tablet TAKE 1 TABLET BY MOUTH  DAILY   . Blood Glucose Monitoring Suppl (ONE TOUCH ULTRA 2) w/Device KIT Use as directed   . carvedilol (COREG) 25 MG tablet TAKE 1 TABLET BY MOUTH TWO  TIMES DAILY   .  Cholecalciferol (VITAMIN D) 1000 UNITS capsule Take 1,000 Units by mouth 2 (two) times daily. Reported on 02/15/2015   . donepezil (ARICEPT) 5 MG tablet Take 1 tablet (5 mg total)  by mouth at bedtime.   . fluocinonide cream (LIDEX) 0.96 % Apply 1 application topically 2 (two) times daily.   . fluticasone (FLONASE) 50 MCG/ACT nasal spray Use 2 sprays in each  nostril every day 09/20/2015: Kept on hand and used as needed  . furosemide (LASIX) 40 MG tablet Take 1 tablet by mouth  daily   . furosemide (LASIX) 80 MG tablet Take 1 tablet (80 mg total) by mouth 2 (two) times daily. 05/24/2015: .  . gabapentin (NEURONTIN) 300 MG capsule TAKE 2 CAPSULES BY MOUTH 3  TIMES DAILY   . glipiZIDE (GLUCOTROL XL) 10 MG 24 hr tablet Take 1 tablet by mouth daily.   Marland Kitchen HYDROcodone-acetaminophen (NORCO) 7.5-325 MG tablet Take 1 tablet by mouth every 6 (six) hours as needed for moderate pain.   Marland Kitchen ibuprofen (ADVIL,MOTRIN) 200 MG tablet Take 800 mg by mouth daily as needed (pain).  09/20/2015: Kept on hand and used as needed  . lisinopril (PRINIVIL,ZESTRIL) 2.5 MG tablet TAKE 1 TABLET BY MOUTH  DAILY   . metFORMIN (GLUCOPHAGE) 500 MG tablet TAKE 2 TABLETS BY MOUTH TWO TIMES DAILY   . Multiple Vitamin (MULTIVITAMIN WITH MINERALS) TABS tablet Take 1 tablet by mouth every morning.   . ONE TOUCH ULTRA TEST test strip CHECK BLOOD SUGAR TWO TIMES DAILY   . ONETOUCH DELICA LANCETS 28Z MISC As directed   . potassium chloride SA (K-DUR,KLOR-CON) 20 MEQ tablet TAKE 1 TABLET BY MOUTH  EVERY EVENING   . predniSONE (DELTASONE) 10 MG tablet 4tab by mouth x 3day,3tab x 3day,2tab x 3 day,1 tab x 3 day   . sitaGLIPtin (JANUVIA) 100 MG tablet Take 1 tablet (100 mg total) by mouth daily.   . tamsulosin (FLOMAX) 0.4 MG CAPS capsule TAKE 1 CAPSULE BY MOUTH  EVERY 12 HOURS   . tiZANidine (ZANAFLEX) 4 MG tablet TAKE 1 TABLET BY MOUTH  EVERY 6 HOURS AS NEEDED FOR MUSCLE SPASMS.   Marland Kitchen traMADol (ULTRAM) 50 MG tablet Take 1 tablet (50 mg total) by mouth every 8 (eight) hours as needed.   . warfarin (COUMADIN) 5 MG tablet TAKE 1 TABLET BY MOUTH DAILY OR AS DIRECTED BY COUMADIN CLINIC    No facility-administered encounter  medications on file as of 05/04/2016.       Assessment:    Acute medical condition (? Gout flare-up) [name] complains of left knee swelling, red, painful to touch- states symptoms came on suddenly.  When question about dietary intake prior to symptoms he can not recall intake. When [facility] discussed dietary concerns related to possible gout symptoms, he states his gout issues occur "no matter what I eat"  [name] has made arrangements to visit his doctor this week for an evaluation He will be assisted to this appointment by a friend   Diabetes- Continues to manage DM with diet and medication. [facility] CM discuss caution with monitoring of blood sugars related to sick days and possible re initiation of medications for his acute issue most likely related to a gout flare up after the upcoming MD visit  Dysphagia - [name] was noted coughing but denies secretions and regurgitates soft food at intervals.  Pt noted with garbled speech on assessment.   He has a scheduled to follow up with Dr Carlean Purl on 05/11/16 at  1545 to review his results of his 3/22 and the Esophagus DG on 04/25/16 at 0930 at [facility].     Plan:  Will complete a home visit to [name] in 2 weeks    [facility] CM Care Plan Problem One     Most Recent Value  Care Plan Problem One  Acute/Chronic Health Condition (dysphagia, early satiety)  Role Documenting the Problem One  Care Management Coordinator  Care Plan for Problem One  Active  THN Long Term Goal (31-90 days)  Over the next 60 days, patient will verbalize understanding of plan of care for treatment of dysphagia and early satiety  THN Long Term Goal Start Date  03/20/16  Interventions for Problem One Long Term Goal  In depth interview with patient re: symptoms,  discussed need for provider input, review scheduled swallowing appointments   [facility]N CM Short Term Goal #1 (0-30 days)  Over the next 10 days, patient will attend scheduled provider appointment  [facility] CM  Short Term Goal #1 Start Date  03/20/16  [facility] CM Short Term Goal #1 Met Date  04/17/16  Interventions for Short Term Goal #1  Provided assistance for patient contact provider office to request appointment  THN CM Short Term Goal #2 (0-30 days)  Over the next 14 days, patient will attend scheduled diagnostic procedures  [facility]N CM Short Term Goal #2 Start Date  04/17/16  Interventions for Short Term Goal #2  reviewed scheduled diagnostic procedures with patient/spouse,  discussed details and addressed questions regarding specific procedures  THN CM Short Term Goal #3 (0-30 days)  over the next 30 days, patient will call provider for new/worsened symptoms  THN CM Short Term Goal #3 Start Date  04/17/16  [facility] CM Short Term Goal #3 Met Date  05/04/16  Interventions for Short Tern Goal #3  reviewed signs of worsening condition warranting call to provider    [facility] CM Care Plan Problem Two     Most Recent Value  Care Plan Problem Two  Acute/Chronic Health Condition (hypoglycemia)  Role Documenting the Problem Two  Care Management Coordinator  Care Plan for Problem Two  Not Active  Interventions for Problem Two Long Term Goal   Reviewed cbg record,  discussed dietary intake,  reviewed and discussed medication management  THN Long Term Goal (31-90) days  Over the next 60 days, patient willl report understanding of plan of care for treatment of hypoglycemia  THN Long Term Goal Start Date  03/20/16  THN Long Term Goal Met Date  04/17/16  [facility] CM Short Term Goal #1 (0-30 days)  Over the next 30 days, patient will report intake of balanced meals three times daily (as per prescribed carb modiified dietary guidelines)  THN CM Short Term Goal #1 Start Date  03/20/16  [facility] CM Short Term Goal #1 Met Date   04/17/16  Interventions for Short Term Goal #2   Reviewed need for protein with each meal,  discussed new symptom: dysphagia and early satiety,  Emmi educational materials prescribed   THN CM Short Term Goal #2 (0-30  days)  Over the next 10 days, patient will attend provider appointment as scheduled  THN CM Short Term Goal #2 Start Date  03/20/16  [facility] CM Short Term Goal #2 Met Date  04/17/16  Interventions for Short Term Goal #2  assisted patient with scheduling provider appointment  [facility] CM Short Term Goal #3 (0-30 days)  Over the next 30 days, patient will verbalize understanding of  signs and symptoms of EARLY hypoglycemia  THN CM Short Term Goal #3 Start Date  03/20/16  [facility] CM Short Term Goal #3 Met Date  04/17/16  Interventions for Short Term Goal #3  Emmi educational materials prescribed       Malasia Torain L. Lavina Hamman, RN, BSN, [facility] Care Management [phone]

## 2016-05-05 ENCOUNTER — Ambulatory Visit (INDEPENDENT_AMBULATORY_CARE_PROVIDER_SITE_OTHER): Payer: Medicare Other | Admitting: Internal Medicine

## 2016-05-05 ENCOUNTER — Encounter: Payer: Self-pay | Admitting: Internal Medicine

## 2016-05-05 VITALS — BP 118/64 | HR 72 | Temp 97.5°F | Ht 71.0 in | Wt 218.0 lb

## 2016-05-05 DIAGNOSIS — IMO0002 Reserved for concepts with insufficient information to code with codable children: Secondary | ICD-10-CM

## 2016-05-05 DIAGNOSIS — I10 Essential (primary) hypertension: Secondary | ICD-10-CM

## 2016-05-05 DIAGNOSIS — M109 Gout, unspecified: Secondary | ICD-10-CM

## 2016-05-05 DIAGNOSIS — E1140 Type 2 diabetes mellitus with diabetic neuropathy, unspecified: Secondary | ICD-10-CM

## 2016-05-05 DIAGNOSIS — E1165 Type 2 diabetes mellitus with hyperglycemia: Secondary | ICD-10-CM | POA: Diagnosis not present

## 2016-05-05 MED ORDER — METHYLPREDNISOLONE ACETATE 80 MG/ML IJ SUSP
80.0000 mg | Freq: Once | INTRAMUSCULAR | Status: AC
  Administered 2016-05-05: 80 mg via INTRAMUSCULAR

## 2016-05-05 MED ORDER — PREDNISONE 10 MG PO TABS: ORAL_TABLET | ORAL | 0 refills | Status: DC

## 2016-05-05 MED ORDER — INDOMETHACIN 50 MG PO CAPS: 50.0000 mg | ORAL_CAPSULE | Freq: Three times a day (TID) | ORAL | 3 refills | Status: DC | PRN

## 2016-05-05 MED ORDER — TRAMADOL HCL 50 MG PO TABS: 50.0000 mg | ORAL_TABLET | Freq: Three times a day (TID) | ORAL | 0 refills | Status: DC | PRN

## 2016-05-05 NOTE — Patient Instructions (Addendum)
You had the steroid shot today  Please take all new medication as prescribed - the prednisone (to [facility])  Please take all new medication as prescribed - the indomethacin for future attacks only if needed in the future (to [facility])  Please continue all other medications as before, and refills have been done if requested - the tramadol  Please have the pharmacy call with any other refills you may need.  Please keep your appointments with your specialists as you may have planned

## 2016-05-05 NOTE — Progress Notes (Signed)
Pre visit review using our clinic review tool, if applicable. No additional management support is needed unless otherwise documented below in the visit note. 

## 2016-05-05 NOTE — Progress Notes (Signed)
Subjective:    Patient ID: [name], male    DOB: 27-Feb-1945, 71 y.o.   MRN: [id number]  HPI  Here after 3 days sudden onset awakening with severe left knee heat, pain, swelling with limping to walk, nothing makes better.  No fever or fall, no trauma.  Pt denies chest pain, increased sob or doe, wheezing, orthopnea, PND, increased LE swelling, palpitations, dizziness or syncope.  Pt denies new neurological symptoms such as new headache, or facial or extremity weakness or numbness   Pt denies polydipsia, polyuria, Past Medical History:  Diagnosis Date  . Aortic root dilatation ([place]) 02/02/2011  . Arthritis    "lower back; going back down both my sciatic nerves"  . 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 BENIGN PROSTATIC HYPERTROPHY 08/29/2006   takes Flomax daily  . 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 Cataracts, bilateral    immature  . CHF (congestive heart failure) (HCC)    takes Lasix daily  . Chronic back pain    HNP   . CORONARY ARTERY DISEASE 08/29/2006   takes Coumadin daily  . DEPRESSION 08/29/2006  . DIABETES MELLITUS, TYPE II 08/29/2006   takes Metformin,Januvia,and Glipizide  daily  . DIASTOLIC HEART FAILURE, CHRONIC 06/09/2008  . GOUT 04/22/2007   takes Allopurinol daily  . History of migraine    1yr ago  . HYPERLIPIDEMIA 08/29/2006   takes Atorvastatin daily  . HYPERTENSION 08/29/2006   takes Lisinopril daily  . INSOMNIA-SLEEP DISORDER-UNSPEC 10/23/2007  . Left lumbar radiculopathy 05/30/2010  . LUMBAR RADICULOPATHY, RIGHT 06/10/2007  . Muscle spasm    takes Zanaflex daily  . Myocardial infarction 12/27/10   "I've had several MIs"  . 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 PACEMAKER, PERMANENT 03/04/2008   pt denies this date  . Peripheral neuropathy (HCC)    takes Gabapentin daily  . Presence of permanent cardiac pacemaker   . Shortness of breath dyspnea    "all my life" with exertion  . Sleep apnea    "if I lay flat I quit breathing;  HOB up I'm fine"   Past Surgical History:  Procedure Laterality Date  . COLONOSCOPY    . CORONARY ANGIOPLASTY WITH STENT PLACEMENT  12/27/10   "I've had a total of 9 cardiac stents put in"  . CORONARY ARTERY BYPASS GRAFT  1992   CABG X 2  . ESOPHAGOGASTRODUODENOSCOPY N/A 12/01/2013   Procedure: ESOPHAGOGASTRODUODENOSCOPY (EGD);  Surgeon: DLafayette Dragon MD;  [facility];  Service: Endoscopy;  Laterality: N/A;  . INSERT / REPLACE / REMOVE PACEMAKER  ~ 2004   initial pacemaker placement  . INSERT / REPLACE / REMOVE PACEMAKER  10/2009   generator change  . LUMBAR LAMINECTOMY/DECOMPRESSION MICRODISCECTOMY Right 03/23/2014   Procedure: LAMINECTOMY AND FORAMINOTOMY RIGHT LUMBAR THREE-FOUR,LUMBAR FOUR-FIVE, LUMBAR FIVE-SACRAL ONE;  Surgeon: HCharlie Pitter MD;  [facility];  Service: Neurosurgery;  Laterality: Right;  right  . LUMBAR LAMINECTOMY/DECOMPRESSION MICRODISCECTOMY Left 12/01/2014   Procedure: Left Lumbar Three-Four, Lumbar Four-Five Laminectomy and Foraminotomy;  Surgeon: HEarnie Larsson MD;  [facility];  Service: Neurosurgery;  Laterality: Left;  . s/p left arm surgury after work accident  1991   "2000# steel fell on it"  . s/p right hand surgury for foreign object  1970's   "piece of wood went in my hand; had to get that out"    reports that he quit smoking about 43 years ago. His smoking use included Cigarettes. He has a 27.00  pack-year smoking history. He has quit using smokeless tobacco. He reports that he does not drink alcohol or use drugs. family history includes Coronary artery disease (age of onset: 62) in his other; Diabetes in his mother, other, and sister; Heart disease in his sister and sister; Lung cancer in his sister. Allergies  Allergen Reactions  . Crestor [Rosuvastatin Calcium] Other (See Comments)    Urination of blood  . Latex Rash and Other (See Comments)    Gloves cause welts on hands!!   Current Outpatient Prescriptions on File  Prior to Visit  Medication Sig Dispense Refill  . allopurinol (ZYLOPRIM) 300 MG tablet take 1 tablet by mouth once daily (Patient taking differently: Take 300 mg by mouth daily. ) 90 tablet 3  . atorvastatin (LIPITOR) 80 MG tablet TAKE 1 TABLET BY MOUTH  DAILY 90 tablet 0  . Blood Glucose Monitoring Suppl (ONE TOUCH ULTRA 2) w/Device KIT Use as directed 1 each 3  . carvedilol (COREG) 25 MG tablet TAKE 1 TABLET BY MOUTH TWO  TIMES DAILY 180 tablet 0  . Cholecalciferol (VITAMIN D) 1000 UNITS capsule Take 1,000 Units by mouth 2 (two) times daily. Reported on 02/15/2015    . donepezil (ARICEPT) 5 MG tablet Take 1 tablet (5 mg total) by mouth at bedtime. 90 tablet 3  . fluocinonide cream (LIDEX) 6.38 % Apply 1 application topically 2 (two) times daily. 30 g 2  . fluticasone (FLONASE) 50 MCG/ACT nasal spray Use 2 sprays in each  nostril every day 48 g 2  . furosemide (LASIX) 80 MG tablet Take 1 tablet (80 mg total) by mouth 2 (two) times daily. 60 tablet 5  . gabapentin (NEURONTIN) 300 MG capsule TAKE 2 CAPSULES BY MOUTH 3  TIMES DAILY 540 capsule 0  . glipiZIDE (GLUCOTROL XL) 10 MG 24 hr tablet Take 1 tablet by mouth daily.    Marland Kitchen HYDROcodone-acetaminophen (NORCO) 7.5-325 MG tablet Take 1 tablet by mouth every 6 (six) hours as needed for moderate pain. 40 tablet 0  . lisinopril (PRINIVIL,ZESTRIL) 2.5 MG tablet TAKE 1 TABLET BY MOUTH  DAILY 90 tablet 0  . metFORMIN (GLUCOPHAGE) 500 MG tablet TAKE 2 TABLETS BY MOUTH TWO TIMES DAILY 360 tablet 0  . Multiple Vitamin (MULTIVITAMIN WITH MINERALS) TABS tablet Take 1 tablet by mouth every morning.    . ONE TOUCH ULTRA TEST test strip CHECK BLOOD SUGAR TWO TIMES DAILY 200 each 0  . ONETOUCH DELICA LANCETS 75I MISC As directed    . potassium chloride SA (K-DUR,KLOR-CON) 20 MEQ tablet TAKE 1 TABLET BY MOUTH  EVERY EVENING 90 tablet 0  . sitaGLIPtin (JANUVIA) 100 MG tablet Take 1 tablet (100 mg total) by mouth daily. 90 tablet 3  . tamsulosin (FLOMAX) 0.4 MG CAPS  capsule TAKE 1 CAPSULE BY MOUTH  EVERY 12 HOURS 180 capsule 0  . tiZANidine (ZANAFLEX) 4 MG tablet TAKE 1 TABLET BY MOUTH  EVERY 6 HOURS AS NEEDED FOR MUSCLE SPASMS. 180 tablet 3  . warfarin (COUMADIN) 5 MG tablet TAKE 1 TABLET BY MOUTH DAILY OR AS DIRECTED BY COUMADIN CLINIC 90 tablet 1   No current facility-administered medications on file prior to visit.     Review of Systems  Constitutional: Negative for other unusual diaphoresis or sweats HENT: Negative for ear discharge or swelling Eyes: Negative for other worsening visual disturbances Respiratory: Negative for stridor or other swelling  Gastrointestinal: Negative for worsening distension or other blood Genitourinary: Negative for retention or other urinary change Musculoskeletal: Negative  for other MSK pain or swelling Skin: Negative for color change or other new lesions Neurological: Negative for worsening tremors and other numbness  Psychiatric/Behavioral: Negative for worsening agitation or other fatigue All  Other system neg per pt    Objective:   Physical Exam BP 118/64   Pulse 72   Temp 97.5 F (36.4 C) (Oral)   Ht '5\' 11"'  (1.803 m)   Wt 218 lb (98.9 kg)   SpO2 97%   BMI 30.40 kg/m  VS noted,  Constitutional: Pt appears in NAD HENT: Head: NCAT.  Right Ear: External ear normal.  Left Ear: External ear normal.  Eyes: . Pupils are equal, round, and reactive to light. Conjunctivae and EOM are normal Nose: without d/c or deformity Neck: Neck supple. Gross normal ROM Cardiovascular: Normal rate and regular rhythm.   Pulmonary/Chest: Effort normal and breath sounds without rales or wheezing.  Left knee 3+ red,tender, swelling with reduced ROM  Neurological: Pt is alert. At baseline orientation, motor grossly intact Skin: Skin is warm. No rashes, other new lesions, no LE edema Psychiatric: Pt behavior is normal without agitation  No other exam findingx    Assessment & Plan:

## 2016-05-07 DIAGNOSIS — M109 Gout, unspecified: Secondary | ICD-10-CM | POA: Insufficient documentation

## 2016-05-07 NOTE — Assessment & Plan Note (Signed)
Mod to severe left knee, for depomedrol IM, predpac asd, and indocin prn future attacks, cont allopurinol,  to f/u any worsening symptoms or concerns

## 2016-05-07 NOTE — Assessment & Plan Note (Signed)
stable overall by history and exam, recent data reviewed with pt, and pt to continue medical treatment as before,  to f/u any worsening symptoms or concerns BP Readings from Last 3 Encounters:  05/05/16 118/64  05/04/16 124/70  04/12/16 132/86

## 2016-05-07 NOTE — Assessment & Plan Note (Signed)
stable overall by history and exam, recent data reviewed with pt, and pt to continue medical treatment as before,  to f/u any worsening symptoms or concerns Lab Results  Component Value Date   HGBA1C 7.8 (H) 03/28/2016   Pt to call for cbg > 200 with steroid tx

## 2016-05-11 ENCOUNTER — Ambulatory Visit: Payer: Medicare Other | Admitting: Internal Medicine

## 2016-05-15 ENCOUNTER — Other Ambulatory Visit: Payer: Self-pay | Admitting: *Deleted

## 2016-05-15 NOTE — Patient Outreach (Signed)
[facility]) Care Management  05/15/2016  WILLY PINKERTON [birth year] [id number]   [facility] CM received message from [place] of pt attempt to reach [facility] CM.  Reports he is out of home but did not have THN CM number.  THN CM called his provided cell number but there is no answer and no voice mailbox set up to receive messages on his provided cell number.  [facility] sent a text to this cell number  Plans [facility] CM will attempt to return a call to pt cell and home number at a later time if no response  Kimberly L. Lavina Hamman, RN, BSN, [facility] Care Management [phone]

## 2016-05-15 NOTE — Patient Outreach (Signed)
[facility]) Care Management  05/15/2016  [name] 08/03/1945 [id number]   [name] called to update [facility] CM on his progress.  He continues to be noted with garbled speech but reports that he has been out of the home completing errands and visiting [name] at [facility]  He reports resolved gout pain after follow up with pcp and receiving steriod. He continues to monitor his cbgs and diet.  No acute needs at this time  Plans [name] will be seen this week for home visit as scheduled Appointment day and time confirmed with [name] L. Lavina Hamman, RN, BSN, [place] Care Management [phone]

## 2016-05-16 ENCOUNTER — Other Ambulatory Visit: Payer: Self-pay | Admitting: Internal Medicine

## 2016-05-17 ENCOUNTER — Other Ambulatory Visit: Payer: Self-pay | Admitting: *Deleted

## 2016-05-17 NOTE — Patient Outreach (Signed)
[facility]) Care Management  05/17/2016  [name] 18-Mar-1945 [id number]   [facility] CM received a voice message from [name] asking for a return call to him. THN CM returned the call to [phone] but there was not an answer not availability to leave a voice message    Plans:  [facility] CM will attempt to contact [name] again this week but is scheduled to see him for a home visit this week  Joelene Millin L. Lavina Hamman, RN, BSN, [place] Care Management [phone]

## 2016-05-18 ENCOUNTER — Other Ambulatory Visit: Payer: Self-pay | Admitting: *Deleted

## 2016-05-18 NOTE — Patient Outreach (Signed)
[place] [facility]) Care Management   05/18/2016  [name] Jul 21, 1945 [id number]  [name] is an 70 y.o. male with history of complete heart block s/p permanent pacemaker, HTN, dyslipidemia, CAD s/p multiple stents and CABG, and DM. [name] is followed in the [facility] [place] [facility] and is seen by Dr. Cristopher Peru. PCP is Dr Cathlean Cower.  He has a follow appointment on 08/30/16 with pcp.  [facility] Care Management is seeing [name] for DM and HTN disease management and care coordination services  Subjective:  "My knee is better.  The only problem is my feet burn" (has neuropathy and is on Neurontin and other narcotic pain medicines Pain level "ten" taking medicine to resolve pain   Objective:   BP 118/60 (BP Location: Left Arm, Patient Position: Sitting, Cuff Size: Normal)   Pulse 61   Resp 20   SpO2 95%   Review of Systems  Constitutional: Negative.  Negative for chills, diaphoresis, fever, malaise/fatigue and weight loss.  HENT: Negative.  Negative for congestion, ear discharge, ear pain, hearing loss, nosebleeds, sinus pain, sore throat and tinnitus.   Eyes: Negative.  Negative for blurred vision, double vision, photophobia, pain, discharge and redness.  Respiratory: Positive for sputum production. Negative for cough, hemoptysis, shortness of breath, wheezing and stridor.        Clear to white secretion  Cardiovascular: Negative.  Negative for chest pain, palpitations, orthopnea, claudication and leg swelling.  Gastrointestinal: Negative.  Negative for abdominal pain, blood in stool, constipation, diarrhea, heartburn, melena, nausea and vomiting.  Genitourinary: Negative.  Negative for dysuria, flank pain, frequency, hematuria and urgency.  Musculoskeletal: Negative.  Negative for back pain, falls, joint pain, myalgias and neck pain.  Skin: Negative.  Negative for itching and rash.  Neurological: Positive for speech change. Negative  for dizziness, tingling, tremors, sensory change, focal weakness, seizures, loss of consciousness, weakness and headaches.       Still with garbled speech   Endo/Heme/Allergies: Negative.  Negative for environmental allergies and polydipsia. Does not bruise/bleed easily.  Psychiatric/Behavioral: Negative.  Negative for depression, hallucinations, memory loss, substance abuse and suicidal ideas. The patient is not nervous/anxious and does not have insomnia.     Physical Exam  Constitutional: He is oriented to person, place, and time. He appears well-developed and well-nourished.  HENT:  Head: Normocephalic and atraumatic.  Right Ear: External ear normal.  Left Ear: External ear normal.  Nose: Nose normal.  Eyes: Conjunctivae and EOM are normal. Pupils are equal, round, and reactive to light.  Neck: Normal range of motion. Neck supple.  Cardiovascular: Normal rate, regular rhythm, normal heart sounds and intact distal pulses.   Respiratory: Effort normal and breath sounds normal.  GI: Soft. Bowel sounds are normal.  Musculoskeletal: Normal range of motion.  Neurological: He is alert and oriented to person, place, and time.  Skin: Skin is warm and dry.  Psychiatric: He has a normal mood and affect. His behavior is normal. Judgment and thought content normal.    Encounter Medications:   Outpatient Encounter Prescriptions as of 05/18/2016  Medication Sig Note  . allopurinol (ZYLOPRIM) 300 MG tablet take 1 tablet by mouth once daily (Patient taking differently: Take 300 mg by mouth daily. )   . atorvastatin (LIPITOR) 80 MG tablet TAKE 1 TABLET BY MOUTH  DAILY   . Blood Glucose Monitoring Suppl (ONE TOUCH ULTRA 2) w/Device KIT Use as directed   . carvedilol (COREG) 25 MG tablet TAKE  1 TABLET BY MOUTH TWO  TIMES DAILY   . Cholecalciferol (VITAMIN D) 1000 UNITS capsule Take 1,000 Units by mouth 2 (two) times daily. Reported on 02/15/2015   . donepezil (ARICEPT) 5 MG tablet Take 1 tablet (5 mg  total) by mouth at bedtime.   . fluocinonide cream (LIDEX) 1.77 % Apply 1 application topically 2 (two) times daily.   . fluticasone (FLONASE) 50 MCG/ACT nasal spray Use 2 sprays in each  nostril every day 09/20/2015: Kept on hand and used as needed  . furosemide (LASIX) 80 MG tablet Take 1 tablet (80 mg total) by mouth 2 (two) times daily. 05/24/2015: .  . gabapentin (NEURONTIN) 300 MG capsule TAKE 2 CAPSULES BY MOUTH 3  TIMES DAILY   . glipiZIDE (GLUCOTROL XL) 10 MG 24 hr tablet TAKE 1 TABLET BY MOUTH  DAILY WITH BREAKFAST   . HYDROcodone-acetaminophen (NORCO) 7.5-325 MG tablet Take 1 tablet by mouth every 6 (six) hours as needed for moderate pain.   . indomethacin (INDOCIN) 50 MG capsule Take 1 capsule (50 mg total) by mouth 3 (three) times daily as needed.   Marland Kitchen lisinopril (PRINIVIL,ZESTRIL) 2.5 MG tablet TAKE 1 TABLET BY MOUTH  DAILY   . metFORMIN (GLUCOPHAGE) 500 MG tablet TAKE 2 TABLETS BY MOUTH TWO TIMES DAILY   . Multiple Vitamin (MULTIVITAMIN WITH MINERALS) TABS tablet Take 1 tablet by mouth every morning.   . ONE TOUCH ULTRA TEST test strip CHECK BLOOD SUGAR TWO TIMES DAILY   . ONETOUCH DELICA LANCETS 93J MISC USE AS DIRECTED THREE TIMES DAILY TO CHECK BLOOD SUGAR   . potassium chloride SA (K-DUR,KLOR-CON) 20 MEQ tablet TAKE 1 TABLET BY MOUTH  EVERY EVENING   . predniSONE (DELTASONE) 10 MG tablet 3 tabs by mouth per day for 3 days,2tabs per day for 3 days,1tab per day for 3 days   . sitaGLIPtin (JANUVIA) 100 MG tablet Take 1 tablet (100 mg total) by mouth daily.   . tamsulosin (FLOMAX) 0.4 MG CAPS capsule TAKE 1 CAPSULE BY MOUTH  EVERY 12 HOURS   . tiZANidine (ZANAFLEX) 4 MG tablet TAKE 1 TABLET BY MOUTH  EVERY 6 HOURS AS NEEDED FOR MUSCLE SPASMS.   Marland Kitchen traMADol (ULTRAM) 50 MG tablet Take 1 tablet (50 mg total) by mouth every 8 (eight) hours as needed.   . warfarin (COUMADIN) 5 MG tablet TAKE 1 TABLET BY MOUTH DAILY OR AS DIRECTED BY COUMADIN CLINIC    No facility-administered encounter  medications on file as of 05/18/2016.     Assessment:    Acute medical condition (pain of feet)  [name] is pleasant and joking with [facility] CM as he normally does. No distress noted except use of restroom during assessment with him stating his feet hurt.  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  Noted talking loud today.  When [facility] CM mentioned his voiding he denies burning, bleeding, frequency or urgency but reports his prostate enlarged and he did not want surgery.  [name]'s left knee swelling, redness, painful to touch symptoms have resolved.  Dr Cathlean Cower provided him with a steroid shot and prednisone po. [name] confirms he completed his dose of prednisone on "Sunday" 05/14/16 and is doing better. He now reports with frequent visits to see his wife who is hospitalized that he is having burning of his feet (PMH neuropathy)at a pain level of "ten  but has Neurontin and narcotic pain medications to resolve pain as needed.   Diabetes-Continues to manage DM with diet and medication but there has been elevations in cgs related to use of prednisone in there 200s.  He reports the lowest cbg was in the 80s since [facility] CM saw him last without any noted symptoms. He reports no recent hypoglycemia episodes but when he has them he feels light headed  Dysphagia- [name] was not noted coughing today but has garbled speech and gargling auscultated.  He denies excess secretions and regurgitates of soft foods. [name] notes secretions white to frothy but able to cough them up and spit them out.  He went to the scheduled to follow up with Dr Carlean Collier on 05/11/16 at 1545 to review his results of his 04/20/16 and the Esophagus DG on 04/25/16 and states "It is all okay"   Plan:   [facility] CM will follow up with [name] in 1-2 weeks   [facility] CM Care Plan Problem One     Most Recent Value  Care Plan Problem One   Acute/Chronic Health Condition (dysphagia, early satiety)  Role Documenting the Problem One  Care Management [place] for Problem One  Active  THN Long Term Goal (31-90 days)  Over the next 60 days, patient will verbalize understanding of plan of care for treatment of dysphagia and early satiety  THN Long Term Goal Start Date  03/20/16  [facility] Long Term Goal Met Date  05/18/16  Interventions for Problem One Long Term Goal  In depth interview with patient re: symptoms,  discussed need for provider input, review scheduled swallowing appointments   [facility]N CM Short Term Goal #1 (0-30 days)  Over the next 10 days, patient will attend scheduled provider appointment  [facility] CM Short Term Goal #1 Start Date  03/20/16  [facility] CM Short Term Goal #1 Met Date  04/17/16  Interventions for Short Term Goal #1  Provided assistance for patient contact provider office to request appointment  [facility]N CM Short Term Goal #2 (0-30 days)  Over the next 14 days, patient will attend scheduled diagnostic procedures  [facility]N CM Short Term Goal #2 Start Date  04/17/16  [facility] CM Short Term Goal #2 Met Date  04/20/16  Interventions for Short Term Goal #2  reviewed scheduled diagnostic procedures with patient/spouse,  discussed details and addressed questions regarding specific procedures  THN CM Short Term Goal #3 (0-30 days)  over the next 30 days, patient will call provider for new/worsened symptoms  THN CM Short Term Goal #3 Start Date  04/17/16  [facility] CM Short Term Goal #3 Met Date  05/04/16  Interventions for Short Tern Goal #3  reviewed signs of worsening condition warranting call to provider    [facility] CM Care Plan Problem Two     Most Recent Value  Care Plan Problem Two  Acute/Chronic Health Condition (hypoglycemia)  Role Documenting the Problem Two  Care Management Coordinator  Care Plan for Problem Two  Not Active  Interventions for Problem Two Long Term Goal   Reviewed cbg record,  discussed dietary intake,  reviewed and  discussed medication management  THN Long Term Goal (31-90) days  Over the next 60 days, patient willl report understanding of plan of care for treatment of hypoglycemia  THN Long Term Goal Start Date  03/20/16  [facility]N Long Term Goal Met Date  04/17/16  [facility] CM Short Term Goal #1 (0-30 days)  Over the next 30 days,  patient will report intake of balanced meals three times daily (as per prescribed carb modiified dietary guidelines)  THN CM Short Term Goal #1 Start Date  03/20/16  [facility] CM Short Term Goal #1 Met Date   04/17/16  Interventions for Short Term Goal #2   Reviewed need for protein with each meal,  discussed new symptom: dysphagia and early satiety,  [employer] educational materials prescribed   THN CM Short Term Goal #2 (0-30 days)  Over the next 10 days, patient will attend provider appointment as scheduled  THN CM Short Term Goal #2 Start Date  03/20/16  [facility] CM Short Term Goal #2 Met Date  04/17/16  Interventions for Short Term Goal #2  assisted patient with scheduling provider appointment  [facility] CM Short Term Goal #3 (0-30 days)  Over the next 30 days, patient will verbalize understanding of signs and symptoms of EARLY hypoglycemia  THN CM Short Term Goal #3 Start Date  03/20/16  [facility] CM Short Term Goal #3 Met Date  04/17/16  Interventions for Short Term Goal #3  [employer] educational materials prescribed        L. Lavina Hamman, RN, BSN, [place] Care Management [phone]

## 2016-05-23 ENCOUNTER — Encounter: Payer: Self-pay | Admitting: Internal Medicine

## 2016-05-31 ENCOUNTER — Other Ambulatory Visit: Payer: Self-pay | Admitting: Internal Medicine

## 2016-06-01 ENCOUNTER — Other Ambulatory Visit: Payer: Self-pay | Admitting: *Deleted

## 2016-06-02 ENCOUNTER — Ambulatory Visit (INDEPENDENT_AMBULATORY_CARE_PROVIDER_SITE_OTHER): Payer: Medicare Other | Admitting: Pharmacist

## 2016-06-02 DIAGNOSIS — I482 Chronic atrial fibrillation, unspecified: Secondary | ICD-10-CM

## 2016-06-02 DIAGNOSIS — I483 Typical atrial flutter: Secondary | ICD-10-CM

## 2016-06-02 DIAGNOSIS — Z7901 Long term (current) use of anticoagulants: Secondary | ICD-10-CM

## 2016-06-02 LAB — POCT INR: INR: 2.6

## 2016-06-13 ENCOUNTER — Encounter: Payer: Self-pay | Admitting: Podiatry

## 2016-06-13 ENCOUNTER — Ambulatory Visit (INDEPENDENT_AMBULATORY_CARE_PROVIDER_SITE_OTHER): Payer: Medicare Other | Admitting: Podiatry

## 2016-06-13 DIAGNOSIS — B351 Tinea unguium: Secondary | ICD-10-CM | POA: Diagnosis not present

## 2016-06-13 DIAGNOSIS — E1142 Type 2 diabetes mellitus with diabetic polyneuropathy: Secondary | ICD-10-CM

## 2016-06-13 DIAGNOSIS — M79676 Pain in unspecified toe(s): Secondary | ICD-10-CM

## 2016-06-13 NOTE — Progress Notes (Signed)
   Subjective:    Patient ID: [name], male    DOB: [birth year], 71 y.o.   MRN: [id number]  HPI this patient presents the office for preventative foot care services. He says his nails have grown thick and long and are painful walking and wearing his shoes. Patient gives a history of having the nail border removed of the right big toe previously.  This patient says he is diabetic and taking medicine by mouth as well as gabapentin for neuropathy. He gives a history of a broken right ankle, which was previously treated.  He presents to the office for preventive footcare services.    Review of Systems  All other systems reviewed and are negative.      Objective:   Physical Exam GENERAL APPEARANCE: Alert, conversant. Appropriately groomed. No acute distress.  VASCULAR: Pedal pulses are  palpable at  [facility] and PT bilateral.  Capillary refill time is immediate to all digits,  Normal temperature gradient.  Digital hair growth is present bilateral  NEUROLOGIC: sensation is normal to 5.07 monofilament at 5/5 sites bilateral.  Light touch is intact bilateral, Muscle strength normal.  MUSCULOSKELETAL: acceptable muscle strength, tone and stability bilateral.  Intrinsic muscluature intact bilateral.  Rectus appearance of foot and digits noted bilateral. Dorsal arthritis Midfoot  B/L  DERMATOLOGIC: skin color, texture, and turgor are within normal limits.  No preulcerative lesions or ulcers  are seen, no interdigital maceration noted.  No open lesions present.   No drainage noted.  NAILS  Thick disfigured discolored nails both feet.         Assessment & Plan:  Onychomycosis  B/L  Diabetes with no complications  IE  Debridement of nails.  Patient was asking if there was additional treatment for his neuropathy. I told him he would need to his talk to his medical doctor about increasing his medication.   Gardiner Barefoot DPM

## 2016-06-15 ENCOUNTER — Other Ambulatory Visit: Payer: Self-pay | Admitting: *Deleted

## 2016-06-15 ENCOUNTER — Telehealth: Payer: Self-pay | Admitting: Internal Medicine

## 2016-06-15 NOTE — Telephone Encounter (Signed)
Kim with [facility] home health case manger  [phone]  Just fyi  Pain on left side of head go to back of ear  bp today 120/60 Heart rate 67

## 2016-06-15 NOTE — Patient Outreach (Signed)
[place]) Care Management  06/15/2016  DELANDO SATTER 1945-04-13 [id number]   Care Coordination  While visiting Mrs Baumgardner, Mr Grabe reports concern with a recent episode of pain in his left temple that went to back of his ear without syncope,no chest pain, no swelling in legs while sitting at his dining room table.  VS taken BP 120/60 (BP Location: Left Arm, Patient Position: Sitting, Cuff Size: Normal)   Pulse 67   SpO2 98%  Mr Siems reports a headache without pain and denies pain today NO other symptoms PMH [facility] CM recommends Mr Irion report these symptoms to his pcp or CV provider as the in home CNA, [place] for Mrs Mariane Duval encouraged him to earlier [facility] CM was informed Mr Barrell forgot his coumadin check appt  [facility] CM encouraged MR Arduini to report his symptoms to pcp or CV  THN CM called DR Jenny Reichmann to report Mr Salek symptoms to Edwena Blow to report to pcp RN THN CM  [facility] CM made 2 attempts to reach CV [place] to leave a voice message then Dr Stanford Breed sent a message via EPIC in basket  [facility] CM encouraged Mr Gist to call to reschedule his coumadin appointment CBG 110 today at  1014 am and averages as 7 day 151, 14 153 and 30 day [place]. Lavina Hamman, RN, BSN, [place] Care Management [phone]

## 2016-06-28 ENCOUNTER — Ambulatory Visit (INDEPENDENT_AMBULATORY_CARE_PROVIDER_SITE_OTHER)
Admission: RE | Admit: 2016-06-28 | Discharge: 2016-06-28 | Disposition: A | Discharge status: Home / Self Care | Payer: Medicare Other | Source: Ambulatory Visit | Attending: Internal Medicine | Admitting: Internal Medicine

## 2016-06-28 ENCOUNTER — Encounter: Payer: Self-pay | Admitting: Internal Medicine

## 2016-06-28 ENCOUNTER — Ambulatory Visit (INDEPENDENT_AMBULATORY_CARE_PROVIDER_SITE_OTHER): Payer: Medicare Other | Admitting: Internal Medicine

## 2016-06-28 VITALS — BP 140/86 | HR 82 | Ht 71.0 in | Wt 214.0 lb

## 2016-06-28 DIAGNOSIS — L600 Ingrowing nail: Secondary | ICD-10-CM

## 2016-06-28 DIAGNOSIS — M25562 Pain in left knee: Secondary | ICD-10-CM

## 2016-06-28 DIAGNOSIS — I10 Essential (primary) hypertension: Secondary | ICD-10-CM | POA: Diagnosis not present

## 2016-06-28 DIAGNOSIS — S8002XA Contusion of left knee, initial encounter: Secondary | ICD-10-CM | POA: Diagnosis not present

## 2016-06-28 MED ORDER — DOXYCYCLINE HYCLATE 100 MG PO TABS: 100.0000 mg | ORAL_TABLET | Freq: Two times a day (BID) | ORAL | 0 refills | Status: DC

## 2016-06-28 MED ORDER — HYDROCODONE-ACETAMINOPHEN 7.5-325 MG PO TABS: 1.0000 | ORAL_TABLET | Freq: Four times a day (QID) | ORAL | 0 refills | Status: DC | PRN

## 2016-06-28 NOTE — Assessment & Plan Note (Signed)
Mild worsening acute on chronic - for podiatry referral back to [place]

## 2016-06-28 NOTE — Assessment & Plan Note (Signed)
S/p fall with what appears to be pretibial bursitis vs cellulitis, no evidence for sepsis and doubt septic knee; for film today, doxy course, pain med refill, to cont walk with cane, and pt now has appt for f/u with Dr Smith/sport med for 345 PM tomorrow

## 2016-06-28 NOTE — Progress Notes (Signed)
Subjective:    Patient ID: [name], male    DOB: 1945/09/03, 71 y.o.   MRN: [id number]  HPI  Here after [place] unfortunate [place] 8 days ago; has some abrasion, but id not really have pain and swelling unitl 5 days ago and had severe pain and swelling up until today when he states he is actually some improved swelling, but still quite painful.  Has been helped by his norco, but now running out due to acute on chronic pain.   Pt denies fever, wt loss, night sweats, loss of appetite, or other constitutional symptoms  Has some left calf sympathetic swelling it seems but no calf tender pain or knots.  Pt denies chest pain, increased sob or doe, wheezing, orthopnea, PND, increased LE swelling, palpitations, dizziness or syncope.  Pt denies new neurological symptoms such as new headache, or facial or extremity weakness or numbness   Pt denies polydipsia, polyuria, Also has ingrown nail right great toe which has been tx before, now worse again, asks for podiatry referral Past Medical History:  Diagnosis Date  . Aortic root dilatation ([place]) 02/02/2011  . Arthritis    "lower back; going back down both my sciatic nerves"  . 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 BENIGN PROSTATIC HYPERTROPHY 08/29/2006   takes Flomax daily  . 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 Cataracts, bilateral    immature  . CHF (congestive heart failure) (HCC)    takes Lasix daily  . Chronic back pain    HNP   . CORONARY ARTERY DISEASE 08/29/2006   takes Coumadin daily  . DEPRESSION 08/29/2006  . DIABETES MELLITUS, TYPE II 08/29/2006   takes Metformin,Januvia,and Glipizide  daily  . DIASTOLIC HEART FAILURE, CHRONIC 06/09/2008  . GOUT 04/22/2007   takes Allopurinol daily  . History of migraine    61yr ago  . HYPERLIPIDEMIA 08/29/2006   takes Atorvastatin daily  . HYPERTENSION 08/29/2006   takes Lisinopril daily  . INSOMNIA-SLEEP DISORDER-UNSPEC 10/23/2007  . Left lumbar  radiculopathy 05/30/2010  . LUMBAR RADICULOPATHY, RIGHT 06/10/2007  . Muscle spasm    takes Zanaflex daily  . [place]   "I've had several MIs"  . 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 PACEMAKER, PERMANENT 03/04/2008   pt denies this date  . Peripheral neuropathy    takes Gabapentin daily  . Presence of permanent cardiac pacemaker   . Shortness of breath dyspnea    "all my life" with exertion  . Sleep apnea    "if I lay flat I quit breathing; HOB up I'm fine"   Past Surgical History:  Procedure Laterality Date  . COLONOSCOPY    . CORONARY ANGIOPLASTY WITH STENT PLACEMENT  12/27/10   "I've had a total of 9 cardiac stents put in"  . CORONARY ARTERY BYPASS GRAFT  1992   CABG X 2  . ESOPHAGOGASTRODUODENOSCOPY N/A 12/01/2013   Procedure: ESOPHAGOGASTRODUODENOSCOPY (EGD);  Surgeon: DLafayette Dragon MD;  [facility];  Service: Endoscopy;  Laterality: N/A;  . INSERT / REPLACE / REMOVE PACEMAKER  ~ 2004   initial pacemaker placement  . INSERT / REPLACE / REMOVE PACEMAKER  10/2009   generator change  . LUMBAR LAMINECTOMY/DECOMPRESSION MICRODISCECTOMY Right 03/23/2014   Procedure: LAMINECTOMY AND FORAMINOTOMY RIGHT LUMBAR THREE-FOUR,LUMBAR FOUR-FIVE, LUMBAR FIVE-SACRAL ONE;  Surgeon: HCharlie Pitter MD;  [facility];  Service: Neurosurgery;  Laterality: Right;  right  . LUMBAR LAMINECTOMY/DECOMPRESSION MICRODISCECTOMY Left  12/01/2014   Procedure: Left Lumbar Three-Four, Lumbar Four-Five Laminectomy and Foraminotomy;  Surgeon: Earnie Larsson, MD;  Location: [facility];  Service: Neurosurgery;  Laterality: Left;  . s/p left arm surgury after work accident  1991   "2000# steel fell on it"  . s/p right hand surgury for foreign object  1970's   "piece of wood went in my hand; had to get that out"    reports that he quit smoking about 43 years ago. His smoking use included Cigarettes. He has a 27.00 pack-year smoking history. He has quit using  smokeless tobacco. He reports that he does not drink alcohol or use drugs. family history includes Coronary artery disease (age of onset: 56) in his other; Diabetes in his mother, other, and sister; Heart disease in his sister and sister; Lung cancer in his sister. Allergies  Allergen Reactions  . Crestor [Rosuvastatin Calcium] Other (See Comments)    Urination of blood  . Latex Rash and Other (See Comments)    Gloves cause welts on hands!!   Current Outpatient Prescriptions on File Prior to Visit  Medication Sig Dispense Refill  . allopurinol (ZYLOPRIM) 300 MG tablet take 1 tablet by mouth once daily (Patient taking differently: Take 300 mg by mouth daily. ) 90 tablet 3  . atorvastatin (LIPITOR) 80 MG tablet TAKE 1 TABLET BY MOUTH  DAILY 90 tablet 2  . Blood Glucose Monitoring Suppl (ONE TOUCH ULTRA 2) w/Device KIT Use as directed 1 each 3  . carvedilol (COREG) 25 MG tablet TAKE 1 TABLET BY MOUTH TWO  TIMES DAILY 180 tablet 2  . Cholecalciferol (VITAMIN D) 1000 UNITS capsule Take 1,000 Units by mouth 2 (two) times daily. Reported on 02/15/2015    . donepezil (ARICEPT) 5 MG tablet Take 1 tablet (5 mg total) by mouth at bedtime. 90 tablet 3  . fluocinonide cream (LIDEX) 1.10 % Apply 1 application topically 2 (two) times daily. 30 g 2  . fluticasone (FLONASE) 50 MCG/ACT nasal spray USE 2 SPRAYS IN EACH  NOSTRIL EVERY DAY 48 g 2  . furosemide (LASIX) 80 MG tablet Take 1 tablet (80 mg total) by mouth 2 (two) times daily. 60 tablet 5  . gabapentin (NEURONTIN) 300 MG capsule TAKE 2 CAPSULES BY MOUTH 3  TIMES DAILY 540 capsule 2  . glipiZIDE (GLUCOTROL XL) 10 MG 24 hr tablet TAKE 1 TABLET BY MOUTH  DAILY WITH BREAKFAST 90 tablet 2  . indomethacin (INDOCIN) 50 MG capsule Take 1 capsule (50 mg total) by mouth 3 (three) times daily as needed. 270 capsule 3  . lisinopril (PRINIVIL,ZESTRIL) 2.5 MG tablet TAKE 1 TABLET BY MOUTH  DAILY 90 tablet 2  . metFORMIN (GLUCOPHAGE) 500 MG tablet TAKE 2 TABLETS BY  MOUTH TWO TIMES DAILY 360 tablet 2  . Multiple Vitamin (MULTIVITAMIN WITH MINERALS) TABS tablet Take 1 tablet by mouth every morning.    . ONE TOUCH ULTRA TEST test strip CHECK BLOOD SUGAR TWO TIMES DAILY 100 each 3  . ONETOUCH DELICA LANCETS 31R MISC USE AS DIRECTED THREE TIMES DAILY TO CHECK BLOOD SUGAR 300 each 2  . potassium chloride SA (K-DUR,KLOR-CON) 20 MEQ tablet TAKE 1 TABLET BY MOUTH  EVERY EVENING 90 tablet 2  . predniSONE (DELTASONE) 10 MG tablet 3 tabs by mouth per day for 3 days,2tabs per day for 3 days,1tab per day for 3 days 18 tablet 0  . sitaGLIPtin (JANUVIA) 100 MG tablet Take 1 tablet (100 mg total) by mouth daily. 90 tablet  3  . tamsulosin (FLOMAX) 0.4 MG CAPS capsule TAKE 1 CAPSULE BY MOUTH  EVERY 12 HOURS 180 capsule 2  . tiZANidine (ZANAFLEX) 4 MG tablet TAKE 1 TABLET BY MOUTH  EVERY 6 HOURS AS NEEDED FOR MUSCLE SPASMS. 180 tablet 3  . traMADol (ULTRAM) 50 MG tablet Take 1 tablet (50 mg total) by mouth every 8 (eight) hours as needed. 60 tablet 0  . warfarin (COUMADIN) 5 MG tablet TAKE 1 TABLET BY MOUTH DAILY OR AS DIRECTED BY COUMADIN CLINIC 90 tablet 1   No current facility-administered medications on file prior to visit.    Review of Systems  Constitutional: Negative for other unusual diaphoresis or sweats HENT: Negative for ear discharge or swelling Eyes: Negative for other worsening visual disturbances Respiratory: Negative for stridor or other swelling  Gastrointestinal: Negative for worsening distension or other blood Genitourinary: Negative for retention or other urinary change Musculoskeletal: Negative for other MSK pain or swelling Skin: Negative for color change or other new lesions Neurological: Negative for worsening tremors and other numbness  Psychiatric/Behavioral: Negative for worsening agitation or other fatigue All other system neg per pt    Objective:   Physical Exam BP 140/86   Pulse 82   Ht '5\' 11"'  (1.803 m)   Wt 214 lb (97.1 kg)   SpO2  100%   BMI 29.85 kg/m  VS noted, walking stooped over with cane to right hand due to pain, limping Constitutional: Pt appears in NAD HENT: Head: NCAT.  Right Ear: External ear normal.  Left Ear: External ear normal.  Eyes: . Pupils are equal, round, and reactive to light. Conjunctivae and EOM are normal Nose: without d/c or deformity Neck: Neck supple. Gross normal ROM Cardiovascular: Normal rate and regular rhythm.   Pulmonary/Chest: Effort normal and breath sounds without rales or wheezing.  Left knee with anterior aspect only with healing abrasion, diffuse mild to mod heat/redness but with 2+ swelling Neurological: Pt is alert. At baseline orientation, motor grossly intact Skin: Skin is warm. No rashes, other new lesions, no LE edema Psychiatric: Pt behavior is normal without agitation  No other exam findings     Assessment & Plan:

## 2016-06-28 NOTE — Patient Instructions (Addendum)
Please take all new medication as prescribed - the antibiotic  Please continue all other medications as before, and refills have been done if requested - the pain medication  Please have the pharmacy call with any other refills you may need.  Please continue your efforts at being more active, low cholesterol diet, and weight control.  Please keep your appointments with your specialists as you may have planned  You will be contacted regarding the referral for: Dr Tamala Julian - 345 PM tomorrow, and podiatry   Please go to the XRAY Department in the Basement (go straight as you get off the elevator) for the x-ray testing  You will be contacted by phone if any changes need to be made immediately.  Otherwise, you will receive a letter about your results with an explanation, but please check with MyChart first.  Please remember to sign up for MyChart if you have not done so, as this will be important to you in the future with finding out test results, communicating by private email, and scheduling acute appointments online when needed.

## 2016-06-28 NOTE — Assessment & Plan Note (Signed)
stable overall by history and exam, recent data reviewed with pt, and pt to continue medical treatment as before,  to f/u any worsening symptoms or concerns BP Readings from Last 3 Encounters:  06/28/16 140/86  06/15/16 120/60  05/18/16 118/60

## 2016-06-29 ENCOUNTER — Ambulatory Visit: Payer: Self-pay

## 2016-06-29 ENCOUNTER — Ambulatory Visit (INDEPENDENT_AMBULATORY_CARE_PROVIDER_SITE_OTHER): Payer: Medicare Other | Admitting: Family Medicine

## 2016-06-29 ENCOUNTER — Encounter: Payer: Self-pay | Admitting: Family Medicine

## 2016-06-29 ENCOUNTER — Encounter: Payer: Self-pay | Admitting: Internal Medicine

## 2016-06-29 VITALS — BP 120/74 | HR 88 | Ht 71.0 in | Wt 214.0 lb

## 2016-06-29 DIAGNOSIS — M25562 Pain in left knee: Secondary | ICD-10-CM

## 2016-06-29 DIAGNOSIS — M7040 Prepatellar bursitis, unspecified knee: Secondary | ICD-10-CM | POA: Insufficient documentation

## 2016-06-29 DIAGNOSIS — M7042 Prepatellar bursitis, left knee: Secondary | ICD-10-CM

## 2016-06-29 NOTE — Patient Instructions (Addendum)
Good to see you  [name] is your friend.  Get a compression sleeve and wear it daily for next week. Do not wear it at night pennsaid pinkie amount topically 2 times daily as needed.   See me again in 3-4 weeks.

## 2016-06-29 NOTE — Progress Notes (Signed)
[name] [address] [place], [place] [address] Phone: [phone] Subjective:    I'm seeing this patient by the request  of:  Biagio Borg, MD   CC: Knee pain and swelling  [id number]  [name] is a 71 y.o. male coming in with complaint of left knee pain and swelling. Patient states that one week ago he fell directly on the anterior aspect of his left knee. Had significant pain immediately and then had swelling 2 days later. To be anteriorly. Severe pain. Patient states that difficulty even flexing the knee. Even 10 tender to touch. Patient saw another provider and was found have swelling. Patient states it is affecting his walking as well as even sleep.     Past Medical History:  Diagnosis Date  . Aortic root dilatation ([place]) 02/02/2011  . Arthritis    "lower back; going back down both my sciatic nerves"  . 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 BENIGN PROSTATIC HYPERTROPHY 08/29/2006   takes Flomax daily  . 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 Cataracts, bilateral    immature  . CHF (congestive heart failure) (HCC)    takes Lasix daily  . Chronic back pain    HNP   . CORONARY ARTERY DISEASE 08/29/2006   takes Coumadin daily  . DEPRESSION 08/29/2006  . DIABETES MELLITUS, TYPE II 08/29/2006   takes Metformin,Januvia,and Glipizide  daily  . DIASTOLIC HEART FAILURE, CHRONIC 06/09/2008  . GOUT 04/22/2007   takes Allopurinol daily  . History of migraine    50yrs ago  . HYPERLIPIDEMIA 08/29/2006   takes Atorvastatin daily  . HYPERTENSION 08/29/2006   takes Lisinopril daily  . INSOMNIA-SLEEP DISORDER-UNSPEC 10/23/2007  . Left lumbar radiculopathy 05/30/2010  . LUMBAR RADICULOPATHY, RIGHT 06/10/2007  . Muscle spasm    takes Zanaflex daily  . Myocardial infarction ([place]) 12/27/10   "I've had several MIs"  . 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 PACEMAKER, PERMANENT 03/04/2008   pt denies this date  . Peripheral  neuropathy    takes Gabapentin daily  . Presence of permanent cardiac pacemaker   . Shortness of breath dyspnea    "all my life" with exertion  . Sleep apnea    "if I lay flat I quit breathing; HOB up I'm fine"   Past Surgical History:  Procedure Laterality Date  . COLONOSCOPY    . CORONARY ANGIOPLASTY WITH STENT PLACEMENT  12/27/10   "I've had a total of 9 cardiac stents put in"  . CORONARY ARTERY BYPASS GRAFT  1992   CABG X 2  . ESOPHAGOGASTRODUODENOSCOPY N/A 12/01/2013   Procedure: ESOPHAGOGASTRODUODENOSCOPY (EGD);  Surgeon: Lafayette Dragon, MD;  [facility];  Service: Endoscopy;  Laterality: N/A;  . INSERT / REPLACE / REMOVE PACEMAKER  ~ 2004   initial pacemaker placement  . INSERT / REPLACE / REMOVE PACEMAKER  10/2009   generator change  . LUMBAR LAMINECTOMY/DECOMPRESSION MICRODISCECTOMY Right 03/23/2014   Procedure: LAMINECTOMY AND FORAMINOTOMY RIGHT LUMBAR THREE-FOUR,LUMBAR FOUR-FIVE, LUMBAR FIVE-SACRAL ONE;  Surgeon: Charlie Pitter, MD;  Location: [facility];  Service: Neurosurgery;  Laterality: Right;  right  . LUMBAR LAMINECTOMY/DECOMPRESSION MICRODISCECTOMY Left 12/01/2014   Procedure: Left Lumbar Three-Four, Lumbar Four-Five Laminectomy and Foraminotomy;  Surgeon: Earnie Larsson, MD;  Location: [facility];  Service: Neurosurgery;  Laterality: Left;  . s/p left arm surgury after work accident  1991   "2000# steel fell on it"  . s/p right hand surgury for foreign  object  1970's   "piece of wood went in my hand; had to get that out"   Social History   Social History  . Marital status: Married    Spouse name: N/A  . Number of children: 2  . Years of education: N/A   Occupational History  . prior work Nature conservation officer  . disabled since 2004    Social History Main Topics  . Smoking status: Former Smoker    Packs/day: 3.00    Years: 9.00    Types: Cigarettes    Quit date: 02/26/1973  . Smokeless tobacco: Former Systems developer     Comment: quit smoking 56yrs  ago  . Alcohol use No  . Drug use: No     Comment: "used pouches of tobacco for a long time; quit those 12/09/1970"  . Sexual activity: Yes   Other Topics Concern  . Not on file   Social History Narrative  . No narrative on file   Allergies  Allergen Reactions  . Crestor [Rosuvastatin Calcium] Other (See Comments)    Urination of blood  . Latex Rash and Other (See Comments)    Gloves cause welts on hands!!   Family History  Problem Relation Age of Onset  . Diabetes Mother   . Diabetes Sister   . Heart disease Sister        2 sister died with heart disease  . Coronary artery disease Other 33       male, first degree relative  . Diabetes Other        1st degree relative  . Heart disease Sister   . Lung cancer Sister        deceased    Past medical history, social, surgical and family history all reviewed in electronic medical record.  No pertanent information unless stated regarding to the chief complaint.   Review of Systems:Review of systems updated and as accurate as of 06/29/16  No headache, visual changes, nausea, vomiting, diarrhea, constipation, dizziness, abdominal pain, skin rash, fevers, chills, night sweats, weight loss, swollen lymph nodes,  chest pain, shortness of breath, mood changes.  Positive joint and muscle and body aches  Objective  There were no vitals taken for this visit. Systems examined below as of 06/29/16   General: No apparent distress alert and oriented x3 mood and affect normal, dressed appropriately.  HEENT: Pupils equal, extraocular movements intact  Respiratory: Patient's speak in full sentences and does not appear short of breath  Cardiovascular: No lower extremity edema, non tender, no erythema  Skin: Warm dry intact with no signs of infection or rash on extremities or on axial skeleton.  Abdomen: Soft nontender  Neuro: Cranial nerves II through XII are intact, neurovascularly intact in all extremities with 2+ DTRs and 2+ pulses.    Lymph: No lymphadenopathy of posterior or anterior cervical chain or axillae bilaterally.  Gait Antalgic gait MSK:  Non tender with full range of motion and good stability and symmetric strength and tone of shoulders, elbows, wrist, hip, and ankles bilaterally. Arthritic changes of multiple joints  Knee: Left Anterior effusion of the knee. Seems to be over the pre-patella. Severely tender to palpation even to light sensation in the prepatellar area ROM full in flexion and extension and lower leg rotation. Patient does have instability with valgus force Negative Mcmurray's, Apley's, and Thessalonian tests. painful patellar compression. Patellar glide with mild crepitus. Patellar and quadriceps tendons unremarkable. Hamstring and quadriceps strength is normal.    MSK US performed  of:  This study was ordered, performed, and interpreted by Charlann Boxer D.O.  Knee: Patient does have a large prepatellar bursa noted. No increasing in Doppler flow. No significant cellulitis.  IMPRESSION: Prepatellar bursitis versus hematoma  Procedure: Real-time Ultrasound Guided Injection of left prepatellar bursa Device: GE Logiq Q7 Ultrasound guided injection is preferred based studies that show increased duration, increased effect, greater accuracy, decreased procedural pain, increased response rate, and decreased cost with ultrasound guided versus blind injection.  Verbal informed consent obtained.  Time-out conducted.  Noted no overlying erythema, induration, or other signs of local infection.  Skin prepped in a sterile fashion.  Local anesthesia: Topical Ethyl chloride.  With sterile technique and under real time ultrasound guidance:  Injected with 1 mL of 0.5% Marcaine and aspirated 9 mL of strawlike color fluid. No sign of infection. Injected 1 mL of Kenalog 40 mg/dL Completed without difficulty  Pain immediately resolved suggesting accurate placement of the medication.  Advised to call if  fevers/chills, erythema, induration, drainage, or persistent bleeding.  Images permanently stored and available for review in the ultrasound unit.  Impression: Technically successful ultrasound guided injection.     Impression and Recommendations:     This case required medical decision making of moderate complexity.      Note: This dictation was prepared with Dragon dictation along with smaller phrase technology. Any transcriptional errors that result from this process are unintentional.

## 2016-06-29 NOTE — Assessment & Plan Note (Signed)
Patient did have aspiration again today. Tolerated the procedure well. No sign of any infectious etiology. No sign of a hemarthrosis. I do not think that any further treatment option is necessary except for potential compression. We discussed icing regimen. Patient will come back and see me again in 3 weeks. Worsening symptoms patient will seek medical attention sooner. If worsening symptoms and concern for potential infection I would cover for MRSA including doxycycline, Bactrim and/or clindamycin.

## 2016-06-30 ENCOUNTER — Telehealth: Payer: Self-pay | Admitting: Family Medicine

## 2016-06-30 ENCOUNTER — Telehealth: Payer: Self-pay | Admitting: Internal Medicine

## 2016-06-30 MED ORDER — CEPHALEXIN 500 MG PO CAPS: 500.0000 mg | ORAL_CAPSULE | Freq: Three times a day (TID) | ORAL | 0 refills | Status: AC

## 2016-06-30 NOTE — Telephone Encounter (Signed)
Patient has called in stating he is in worse pain today after the injection.  States he can hardly walk.  Also, states that food makes him sick to his stomach.  Please advise.

## 2016-06-30 NOTE — Telephone Encounter (Signed)
After speaking with Dr. Tamala Julian he has advised patient to give injection another day.  If he feels like he is getting worse over the weekend to go to the ED or to give [place] a call Monday to get scheduled with one of the providers for evaluation.  Dr. Tamala Julian does advise patient to continue to monitor blood sugars.  Did get in contact with patients wife to advise.

## 2016-06-30 NOTE — Telephone Encounter (Signed)
Ok to try change to cephalexin - done erx

## 2016-06-30 NOTE — Telephone Encounter (Signed)
Pt called and cannot afford, doxycycline (VIBRA-TABS) 100 MG tablet He would like somethin else sent in.   Same pharmacy

## 2016-07-19 ENCOUNTER — Other Ambulatory Visit: Payer: Self-pay | Admitting: Internal Medicine

## 2016-07-19 ENCOUNTER — Telehealth: Payer: Self-pay | Admitting: Internal Medicine

## 2016-07-19 NOTE — Telephone Encounter (Signed)
Pt cannot afford HYDROcodone-acetaminophen (NORCO) 7.5-325 MG tablet, he would like something else called in

## 2016-07-19 NOTE — Telephone Encounter (Signed)
Called pt, phone just rings, no VM.   He should ask the pharmacist what his insurance covers that is comparable that would be in his price range.

## 2016-07-22 ENCOUNTER — Other Ambulatory Visit: Payer: Self-pay | Admitting: Internal Medicine

## 2016-07-24 ENCOUNTER — Ambulatory Visit (INDEPENDENT_AMBULATORY_CARE_PROVIDER_SITE_OTHER): Payer: Medicare Other | Admitting: Pharmacist Clinician (PhC)/ Clinical Pharmacy Specialist

## 2016-07-24 ENCOUNTER — Telehealth: Payer: Self-pay | Admitting: Internal Medicine

## 2016-07-24 ENCOUNTER — Telehealth: Payer: Self-pay

## 2016-07-24 DIAGNOSIS — I483 Typical atrial flutter: Secondary | ICD-10-CM

## 2016-07-24 DIAGNOSIS — Z7901 Long term (current) use of anticoagulants: Secondary | ICD-10-CM

## 2016-07-24 DIAGNOSIS — I482 Chronic atrial fibrillation, unspecified: Secondary | ICD-10-CM

## 2016-07-24 LAB — POCT INR: INR: 1.9

## 2016-07-24 MED ORDER — SITAGLIPTIN PHOSPHATE 100 MG PO TABS: 100.0000 mg | ORAL_TABLET | Freq: Every day | ORAL | 0 refills | Status: DC

## 2016-07-24 NOTE — Telephone Encounter (Signed)
error 

## 2016-07-24 NOTE — Telephone Encounter (Signed)
Patient only has a weeks left of Januvia.  Can we send in to mail order pharmacy for patient today?

## 2016-07-24 NOTE — Telephone Encounter (Signed)
Patient will contact his pharmacy to get them to send a request for a different narcotic that would be cheaper under his insurance coverage.   Please keep an eye out for this to come.

## 2016-07-24 NOTE — Telephone Encounter (Signed)
90 day supply sent to [facility]

## 2016-07-25 ENCOUNTER — Other Ambulatory Visit: Payer: Self-pay | Admitting: Cardiology

## 2016-07-27 ENCOUNTER — Ambulatory Visit: Payer: Medicare Other | Admitting: Internal Medicine

## 2016-07-28 ENCOUNTER — Encounter: Payer: Self-pay | Admitting: Internal Medicine

## 2016-07-28 ENCOUNTER — Other Ambulatory Visit: Payer: Self-pay | Admitting: *Deleted

## 2016-07-28 ENCOUNTER — Ambulatory Visit (INDEPENDENT_AMBULATORY_CARE_PROVIDER_SITE_OTHER): Payer: Medicare Other | Admitting: Internal Medicine

## 2016-07-28 VITALS — BP 124/76 | HR 79 | Ht 71.0 in | Wt 218.0 lb

## 2016-07-28 DIAGNOSIS — E1165 Type 2 diabetes mellitus with hyperglycemia: Secondary | ICD-10-CM | POA: Diagnosis not present

## 2016-07-28 DIAGNOSIS — E1140 Type 2 diabetes mellitus with diabetic neuropathy, unspecified: Secondary | ICD-10-CM

## 2016-07-28 DIAGNOSIS — Z0000 Encounter for general adult medical examination without abnormal findings: Secondary | ICD-10-CM

## 2016-07-28 DIAGNOSIS — E785 Hyperlipidemia, unspecified: Secondary | ICD-10-CM

## 2016-07-28 DIAGNOSIS — IMO0002 Reserved for concepts with insufficient information to code with codable children: Secondary | ICD-10-CM

## 2016-07-28 DIAGNOSIS — I10 Essential (primary) hypertension: Secondary | ICD-10-CM | POA: Diagnosis not present

## 2016-07-28 LAB — POCT GLYCOSYLATED HEMOGLOBIN (HGB A1C): Hemoglobin A1C: 6.6

## 2016-07-28 MED ORDER — SITAGLIPTIN PHOSPHATE 100 MG PO TABS: 100.0000 mg | ORAL_TABLET | Freq: Every day | ORAL | 0 refills | Status: DC

## 2016-07-28 NOTE — Patient Outreach (Addendum)
[facility]) Care Management  07/28/2016  [name] 31-Aug-1945 [id number]   Care coordination [name] has been followed by this [facility] CM since April 2018. He has not had any exacerbations, has not had any admissions nor ED visits in the last 6 months and is agreeing to being contacted by [facility] health coach for continued follow up care as needed on 07/27/16  He continues to monitor his cbgs  He was seen on 07/27/16 while [facility] CM visited his wife, jean.  He is walking without any assisted devices and is joking and smiling with [facility] CM.  No distress noted and no voiced medical concerns when [facility] CM inquired. He states "I'm doing good.  I'm cooking for my grandson who will be visiting today"  Plans Order in [facility] for comm health coach to transfer of services  Letter to [name] and pcp sent  [place] L. Lavina Hamman, RN, BSN, [place] Care Management [phone]

## 2016-07-28 NOTE — Assessment & Plan Note (Signed)
stable overall by history and exam, recent data reviewed with pt, and pt to continue medical treatment as before,  to f/u any worsening symptoms or concerns Lab Results  Component Value Date   LDLCALC 45 03/28/2016

## 2016-07-28 NOTE — Assessment & Plan Note (Signed)
likley improved, o/w stable overall by history and exam, recent data reviewed with pt, and pt to continue medical treatment as before,  to f/u any worsening symptoms or concerns Lab Results  Component Value Date   HGBA1C 6.6 07/28/2016

## 2016-07-28 NOTE — Progress Notes (Signed)
Subjective:    Patient ID: [name], male    DOB: April 14, 1945, 71 y.o.   MRN: [id number]  HPI  Here to f/u; overall doing ok,  Pt denies chest pain, increasing sob or doe, wheezing, orthopnea, PND, increased LE swelling, palpitations, dizziness or syncope.  Pt denies new neurological symptoms such as new headache, or facial or extremity weakness or numbness.  Pt denies polydipsia, polyuria, or low sugar episode.  Pt states overall good compliance with meds, mostly trying to follow appropriate diet, with wt overall stable,  but little exercise however.  Left knee pain and swelling c/w prepatellar bursitis is resolved after about 4 days after seen per sports medicine.  Does c/o ongoing fatigue, but denies signficant daytime hypersomnolence. Has been eval for OSA in past and neg exam Past Medical History:  Diagnosis Date  . Aortic root dilatation ([place]) 02/02/2011  . Arthritis    "lower back; going back down both my sciatic nerves"  . 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 BENIGN PROSTATIC HYPERTROPHY 08/29/2006   takes Flomax daily  . 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 Cataracts, bilateral    immature  . CHF (congestive heart failure) (H[facility])    takes Lasix daily  . Chronic back pain    HNP   . CORONARY ARTERY DISEASE 08/29/2006   takes Coumadin daily  . DEPRESSION 08/29/2006  . DIABETES MELLITUS, TYPE II 08/29/2006   takes Metformin,Januvia,and Glipizide  daily  . DIASTOLIC HEART FAILURE, CHRONIC 06/09/2008  . GOUT 04/22/2007   takes Allopurinol daily  . History of migraine    32yr ago  . HYPERLIPIDEMIA 08/29/2006   takes Atorvastatin daily  . HYPERTENSION 08/29/2006   takes Lisinopril daily  . INSOMNIA-SLEEP DISORDER-UNSPEC 10/23/2007  . Left lumbar radiculopathy 05/30/2010  . LUMBAR RADICULOPATHY, RIGHT 06/10/2007  . Muscle spasm    takes Zanaflex daily  . [place] 12/27/10   "I've had several MIs"  . NEOPLASM, MALIGNANT, PROSTATE 11/26/2006    . PACEMAKER, PERMANENT 03/04/2008   pt denies this date  . Peripheral neuropathy    takes Gabapentin daily  . Presence of permanent cardiac pacemaker   . Shortness of breath dyspnea    "all my life" with exertion  . Sleep apnea    "if I lay flat I quit breathing; HOB up I'm fine"   Past Surgical History:  Procedure Laterality Date  . COLONOSCOPY    . CORONARY ANGIOPLASTY WITH STENT PLACEMENT  12/27/10   "I've had a total of 9 cardiac stents put in"  . CORONARY ARTERY BYPASS GRAFT  1992   CABG X 2  . ESOPHAGOGASTRODUODENOSCOPY N/A 12/01/2013   Procedure: ESOPHAGOGASTRODUODENOSCOPY (EGD);  Surgeon: DLafayette Dragon MD;  [facility];  Service: Endoscopy;  Laterality: N/A;  . INSERT / REPLACE / REMOVE PACEMAKER  ~ 2004   initial pacemaker placement  . INSERT / REPLACE / REMOVE PACEMAKER  10/2009   generator change  . LUMBAR LAMINECTOMY/DECOMPRESSION MICRODISCECTOMY Right 03/23/2014   Procedure: LAMINECTOMY AND FORAMINOTOMY RIGHT LUMBAR THREE-FOUR,LUMBAR FOUR-FIVE, LUMBAR FIVE-SACRAL ONE;  Surgeon: HCharlie Pitter MD;  [facility];  Service: Neurosurgery;  Laterality: Right;  right  . LUMBAR LAMINECTOMY/DECOMPRESSION MICRODISCECTOMY Left 12/01/2014   Procedure: Left Lumbar Three-Four, Lumbar Four-Five Laminectomy and Foraminotomy;  Surgeon: HEarnie Larsson MD;  [facility];  Service: Neurosurgery;  Laterality: Left;  . s/p left arm surgury after work accident  1991   "2000# steel fell on it"  .  s/p right hand surgury for foreign object  1970's   "piece of wood went in my hand; had to get that out"    reports that he quit smoking about 43 years ago. His smoking use included Cigarettes. He has a 27.00 pack-year smoking history. He has quit using smokeless tobacco. He reports that he does not drink alcohol or use drugs. family history includes Coronary artery disease (age of onset: 10) in his other; Diabetes in his mother, other, and sister; Heart disease in his sister  and sister; Lung cancer in his sister. Allergies  Allergen Reactions  . Crestor [Rosuvastatin Calcium] Other (See Comments)    Urination of blood  . Latex Rash and Other (See Comments)    Gloves cause welts on hands!!   Current Outpatient Prescriptions on File Prior to Visit  Medication Sig Dispense Refill  . allopurinol (ZYLOPRIM) 300 MG tablet take 1 tablet by mouth once daily (Patient taking differently: Take 300 mg by mouth daily. ) 90 tablet 3  . atorvastatin (LIPITOR) 80 MG tablet TAKE 1 TABLET BY MOUTH  DAILY 90 tablet 2  . Blood Glucose Monitoring Suppl (ONE TOUCH ULTRA 2) w/Device KIT Use as directed 1 each 3  . carvedilol (COREG) 25 MG tablet TAKE 1 TABLET BY MOUTH TWO  TIMES DAILY 180 tablet 2  . Cholecalciferol (VITAMIN D) 1000 UNITS capsule Take 1,000 Units by mouth 2 (two) times daily. Reported on 02/15/2015    . donepezil (ARICEPT) 5 MG tablet Take 1 tablet (5 mg total) by mouth at bedtime. 90 tablet 3  . fluocinonide cream (LIDEX) 5.36 % Apply 1 application topically 2 (two) times daily. 30 g 2  . fluticasone (FLONASE) 50 MCG/ACT nasal spray USE 2 SPRAYS IN EACH  NOSTRIL EVERY DAY 48 g 2  . furosemide (LASIX) 40 MG tablet TAKE 1 TABLET BY MOUTH  DAILY 90 tablet 2  . furosemide (LASIX) 80 MG tablet Take 1 tablet (80 mg total) by mouth 2 (two) times daily. 60 tablet 5  . gabapentin (NEURONTIN) 300 MG capsule TAKE 2 CAPSULES BY MOUTH 3  TIMES DAILY 540 capsule 2  . glipiZIDE (GLUCOTROL XL) 10 MG 24 hr tablet TAKE 1 TABLET BY MOUTH  DAILY WITH BREAKFAST 90 tablet 2  . indomethacin (INDOCIN) 50 MG capsule Take 1 capsule (50 mg total) by mouth 3 (three) times daily as needed. 270 capsule 3  . lisinopril (PRINIVIL,ZESTRIL) 2.5 MG tablet TAKE 1 TABLET BY MOUTH  DAILY 90 tablet 2  . metFORMIN (GLUCOPHAGE) 500 MG tablet TAKE 2 TABLETS BY MOUTH TWO TIMES DAILY 360 tablet 2  . Multiple Vitamin (MULTIVITAMIN WITH MINERALS) TABS tablet Take 1 tablet by mouth every morning.    . ONE TOUCH  ULTRA TEST test strip CHECK BLOOD SUGAR TWO TIMES DAILY 100 each 3  . ONETOUCH DELICA LANCETS 14E MISC USE AS DIRECTED THREE TIMES DAILY TO CHECK BLOOD SUGAR 300 each 2  . potassium chloride SA (K-DUR,KLOR-CON) 20 MEQ tablet TAKE 1 TABLET BY MOUTH  EVERY EVENING 90 tablet 2  . predniSONE (DELTASONE) 10 MG tablet 3 tabs by mouth per day for 3 days,2tabs per day for 3 days,1tab per day for 3 days 18 tablet 0  . tamsulosin (FLOMAX) 0.4 MG CAPS capsule TAKE 1 CAPSULE BY MOUTH  EVERY 12 HOURS 180 capsule 2  . tiZANidine (ZANAFLEX) 4 MG tablet TAKE 1 TABLET BY MOUTH  EVERY 6 HOURS AS NEEDED FOR MUSCLE SPASMS. 180 tablet 0  . traMADol (ULTRAM) 50  MG tablet Take 1 tablet (50 mg total) by mouth every 8 (eight) hours as needed. 60 tablet 0  . warfarin (COUMADIN) 5 MG tablet TAKE 1 TABLET BY MOUTH DAILY OR AS DIRECTED BY COUMADIN CLINIC 90 tablet 1   No current facility-administered medications on file prior to visit.    Review of Systems  Constitutional: Negative for other unusual diaphoresis or sweats HENT: Negative for ear discharge or swelling Eyes: Negative for other worsening visual disturbances Respiratory: Negative for stridor or other swelling  Gastrointestinal: Negative for worsening distension or other blood Genitourinary: Negative for retention or other urinary change Musculoskeletal: Negative for other MSK pain or swelling Skin: Negative for color change or other new lesions Neurological: Negative for worsening tremors and other numbness  Psychiatric/Behavioral: Negative for worsening agitation or other fatigue All other system neg per pt    Objective:   Physical Exam BP 124/76   Pulse 79   Ht 5' 11" (1.803 m)   Wt 218 lb (98.9 kg)   SpO2 98%   BMI 30.40 kg/m  VS noted,  Constitutional: Pt appears in NAD HENT: Head: NCAT.  Right Ear: External ear normal.  Left Ear: External ear normal.  Eyes: . Pupils are equal, round, and reactive to light. Conjunctivae and EOM are  normal Nose: without d/c or deformity Neck: Neck supple. Gross normal ROM Cardiovascular: Normal rate and regular rhythm.   Pulmonary/Chest: Effort normal and breath sounds without rales or wheezing.  Neurological: Pt is alert. At baseline orientation, motor grossly intact Skin: Skin is warm. No rashes, other new lesions, no LE edema Psychiatric: Pt behavior is normal without agitation  No other exam findings  POCT glycosylated hemoglobin (Hb A1C)   09:31  Hemoglobin A1C 6.6           Assessment & Plan:

## 2016-07-28 NOTE — Assessment & Plan Note (Signed)
stable overall by history and exam, recent data reviewed with pt, and pt to continue medical treatment as before,  to f/u any worsening symptoms or concerns BP Readings from Last 3 Encounters:  07/28/16 124/76  06/29/16 120/74  06/28/16 140/86

## 2016-07-28 NOTE — Patient Instructions (Addendum)
Your A1c was OK today  Please continue all other medications as before, and refills have been done if requested.  Please have the pharmacy call with any other refills you may need.  Please continue your efforts at being more active, low cholesterol diet, and weight control.  You are otherwise up to date with prevention measures today.  Please keep your appointments with your specialists as you may have planned  Please return in 6 months, or sooner if needed, with Lab testing done 3-5 days before

## 2016-08-08 ENCOUNTER — Ambulatory Visit (INDEPENDENT_AMBULATORY_CARE_PROVIDER_SITE_OTHER): Payer: Medicare Other | Admitting: Family

## 2016-08-08 ENCOUNTER — Encounter: Payer: Self-pay | Admitting: Family

## 2016-08-08 ENCOUNTER — Other Ambulatory Visit: Payer: Self-pay

## 2016-08-08 VITALS — BP 130/64 | HR 72 | Temp 98.1°F | Resp 18 | Ht 71.0 in | Wt 213.0 lb

## 2016-08-08 DIAGNOSIS — L600 Ingrowing nail: Secondary | ICD-10-CM | POA: Diagnosis not present

## 2016-08-08 MED ORDER — CEPHALEXIN 500 MG PO CAPS: 500.0000 mg | ORAL_CAPSULE | Freq: Four times a day (QID) | ORAL | 0 refills | Status: DC

## 2016-08-08 NOTE — Patient Outreach (Signed)
[facility]) Care Management  08/08/2016  [name] [birth year] [id number]   Telephone call to patient for introductory call.  No answer.  Unable to leave a message.  Plan: RN Health Coach will attempt to reach patient again in the month of July.  Jone Baseman, RN, MSN [place] [phone]

## 2016-08-08 NOTE — Patient Instructions (Signed)
Thank you for choosing [facility].  SUMMARY AND INSTRUCTIONS:  Soak in warm water and Epson salt.   Start the Keflex for infection.  Follow up with podiatry.  Medication:  Your prescription(s) have been submitted to your pharmacy or been printed and provided for you. Please take as directed and contact our office if you believe you are having problem(s) with the medication(s) or have any questions.  Follow up:  If your symptoms worsen or fail to improve, please contact our office for further instruction, or in case of emergency go directly to the emergency room at the closest medical facility.

## 2016-08-08 NOTE — Assessment & Plan Note (Signed)
New onset toe pain with symptoms being consistent with ingrown toe nail with redness and mild discharge with concern for infection. Start Keflex. Recommend podiatry follow up for toe nail removal given diabetes status. Follow up if symptoms worsen or do not improve.

## 2016-08-08 NOTE — Progress Notes (Signed)
Subjective:    Patient ID: [name], male    DOB: 1945/02/03, 71 y.o.   MRN: [id number]  Chief Complaint  Patient presents with  . Toe Pain    right foot big toe pain, swelling and redness and states pain feels like it is going up his leg, x1 week     HPI:  [name] is a 71 y.o. male who  has a past medical history of Aortic root dilatation ([place]) (02/02/2011); Arthritis; Atrioventricular block, complete ([place]) (09/04/2008); BENIGN PROSTATIC HYPERTROPHY (08/29/2006); CAD, AUTOLOGOUS BYPASS GRAFT (03/04/2008); Cataracts, bilateral; CHF (congestive heart failure) ([place]); Chronic back pain; CORONARY ARTERY DISEASE (08/29/2006); DEPRESSION (08/29/2006); DIABETES MELLITUS, TYPE II (0/35/0093); DIASTOLIC HEART FAILURE, CHRONIC (06/09/2008); GOUT (04/22/2007); History of migraine; HYPERLIPIDEMIA (08/29/2006); HYPERTENSION (08/29/2006); INSOMNIA-SLEEP DISORDER-UNSPEC (10/23/2007); Left lumbar radiculopathy (05/30/2010); LUMBAR RADICULOPATHY, RIGHT (06/10/2007); Muscle spasm; Myocardial infarction ([place]) (12/27/10); NEOPLASM, MALIGNANT, PROSTATE (11/26/2006); PACEMAKER, PERMANENT (03/04/2008); Peripheral neuropathy; Presence of permanent cardiac pacemaker; Shortness of breath dyspnea; and Sleep apnea. and presents today for an acute office visit.  This is a new problem. Associated symptom of pain located in his right great toe has been going on for about 1 week with no significant trauma or injury. Modifying factors include peroxide and band-aids which has not helped very much. Pain is described as sharp and a razor. No fevers. Course of the symptoms has worsened in the past 2 days.    Allergies  Allergen Reactions  . Crestor [Rosuvastatin Calcium] Other (See Comments)    Urination of blood  . Latex Rash and Other (See Comments)    Gloves cause welts on hands!!      Outpatient Medications Prior to Visit  Medication Sig Dispense Refill  . allopurinol (ZYLOPRIM) 300 MG tablet take 1 tablet by mouth once  daily (Patient taking differently: Take 300 mg by mouth daily. ) 90 tablet 3  . atorvastatin (LIPITOR) 80 MG tablet TAKE 1 TABLET BY MOUTH  DAILY 90 tablet 2  . Blood Glucose Monitoring Suppl (ONE TOUCH ULTRA 2) w/Device KIT Use as directed 1 each 3  . carvedilol (COREG) 25 MG tablet TAKE 1 TABLET BY MOUTH TWO  TIMES DAILY 180 tablet 2  . Cholecalciferol (VITAMIN D) 1000 UNITS capsule Take 1,000 Units by mouth 2 (two) times daily. Reported on 02/15/2015    . donepezil (ARICEPT) 5 MG tablet Take 1 tablet (5 mg total) by mouth at bedtime. 90 tablet 3  . fluocinonide cream (LIDEX) 8.18 % Apply 1 application topically 2 (two) times daily. 30 g 2  . fluticasone (FLONASE) 50 MCG/ACT nasal spray USE 2 SPRAYS IN EACH  NOSTRIL EVERY DAY 48 g 2  . furosemide (LASIX) 40 MG tablet TAKE 1 TABLET BY MOUTH  DAILY 90 tablet 2  . furosemide (LASIX) 80 MG tablet Take 1 tablet (80 mg total) by mouth 2 (two) times daily. 60 tablet 5  . gabapentin (NEURONTIN) 300 MG capsule TAKE 2 CAPSULES BY MOUTH 3  TIMES DAILY 540 capsule 2  . glipiZIDE (GLUCOTROL XL) 10 MG 24 hr tablet TAKE 1 TABLET BY MOUTH  DAILY WITH BREAKFAST 90 tablet 2  . indomethacin (INDOCIN) 50 MG capsule Take 1 capsule (50 mg total) by mouth 3 (three) times daily as needed. 270 capsule 3  . lisinopril (PRINIVIL,ZESTRIL) 2.5 MG tablet TAKE 1 TABLET BY MOUTH  DAILY 90 tablet 2  . metFORMIN (GLUCOPHAGE) 500 MG tablet TAKE 2 TABLETS BY MOUTH TWO TIMES DAILY 360 tablet 2  . Multiple Vitamin (  MULTIVITAMIN WITH MINERALS) TABS tablet Take 1 tablet by mouth every morning.    . ONE TOUCH ULTRA TEST test strip CHECK BLOOD SUGAR TWO TIMES DAILY 100 each 3  . ONETOUCH DELICA LANCETS 03U MISC USE AS DIRECTED THREE TIMES DAILY TO CHECK BLOOD SUGAR 300 each 2  . potassium chloride SA (K-DUR,KLOR-CON) 20 MEQ tablet TAKE 1 TABLET BY MOUTH  EVERY EVENING 90 tablet 2  . predniSONE (DELTASONE) 10 MG tablet 3 tabs by mouth per day for 3 days,2tabs per day for 3 days,1tab per  day for 3 days 18 tablet 0  . sitaGLIPtin (JANUVIA) 100 MG tablet Take 1 tablet (100 mg total) by mouth daily. 90 tablet 0  . tamsulosin (FLOMAX) 0.4 MG CAPS capsule TAKE 1 CAPSULE BY MOUTH  EVERY 12 HOURS 180 capsule 2  . tiZANidine (ZANAFLEX) 4 MG tablet TAKE 1 TABLET BY MOUTH  EVERY 6 HOURS AS NEEDED FOR MUSCLE SPASMS. 180 tablet 0  . traMADol (ULTRAM) 50 MG tablet Take 1 tablet (50 mg total) by mouth every 8 (eight) hours as needed. 60 tablet 0  . warfarin (COUMADIN) 5 MG tablet TAKE 1 TABLET BY MOUTH DAILY OR AS DIRECTED BY COUMADIN CLINIC 90 tablet 1   No facility-administered medications prior to visit.      Review of Systems  Constitutional: Negative for chills and fever.  Musculoskeletal:       Positive for right great toe pain.   Neurological: Negative for weakness and numbness.      Objective:    BP 130/64 (BP Location: Right Arm, Patient Position: Sitting, Cuff Size: Large)   Pulse 72   Temp 98.1 F (36.7 C) (Oral)   Resp 18   Ht _0  (1.803 m)   Wt 213 lb (96.6 kg)   SpO2 98%   BMI 29.71 kg/m  Nursing note and vital signs reviewed.  Physical Exam  Constitutional: He is oriented to person, place, and time. He appears well-developed and well-nourished. No distress.  Cardiovascular: Normal rate, regular rhythm, normal heart sounds and intact distal pulses.   Pulmonary/Chest: Effort normal and breath sounds normal.  Musculoskeletal:  Right great toe - Obvious redness and edema with ingrown lateral border noted. Tender to the touch. ROM is normal.   Neurological: He is alert and oriented to person, place, and time.  Skin: Skin is warm and dry.  Psychiatric: He has a normal mood and affect. His behavior is normal. Judgment and thought content normal.       Assessment & Plan:   Problem List Items Addressed This Visit      Musculoskeletal and Integument   Ingrown nail - Primary    New onset toe pain with symptoms being consistent with ingrown toe nail with  redness and mild discharge with concern for infection. Start Keflex. Recommend podiatry follow up for toe nail removal given diabetes status. Follow up if symptoms worsen or do not improve.          I am having Mr. Slauson start on cephALEXin. I am also having him maintain his multivitamin with minerals, Vitamin D, allopurinol, furosemide, ONE TOUCH ULTRA 2, fluocinonide cream, donepezil, predniSONE, indomethacin, traMADol, ONE TOUCH ULTRA TEST, atorvastatin, gabapentin, glipiZIDE, carvedilol, lisinopril, metFORMIN, tamsulosin, potassium chloride SA, ONETOUCH DELICA LANCETS 13H, fluticasone, furosemide, tiZANidine, warfarin, and sitaGLIPtin.   Meds ordered this encounter  Medications  . cephALEXin (KEFLEX) 500 MG capsule    Sig: Take 1 capsule (500 mg total) by mouth 4 (four) times daily.  Dispense:  28 capsule    Refill:  0    Order Specific Question:   Supervising Provider    Answer:   Pricilla Holm A [[id number]]     Follow-up: Return if symptoms worsen or fail to improve.  Mauricio Po, FNP

## 2016-08-09 ENCOUNTER — Ambulatory Visit (INDEPENDENT_AMBULATORY_CARE_PROVIDER_SITE_OTHER): Payer: Medicare Other | Admitting: Podiatry

## 2016-08-09 ENCOUNTER — Encounter: Payer: Self-pay | Admitting: Podiatry

## 2016-08-09 DIAGNOSIS — L600 Ingrowing nail: Secondary | ICD-10-CM | POA: Diagnosis not present

## 2016-08-09 DIAGNOSIS — L03031 Cellulitis of right toe: Secondary | ICD-10-CM | POA: Diagnosis not present

## 2016-08-09 NOTE — Progress Notes (Signed)
This patient presents the office with chief complaint of a infected ingrown toenail outside border the big toe, right foot. He says it has been ingrown and painful for the last 2 weeks, but the last week. He has become infected.  He says he has pain walking and wearing his shoes.  He has a history of previous ingrowing toenails which have been treated but not permanently corrected.  He presents the office today for definitive evaluation and treatment of this infected ingrown toenail.  This patient is a diabetic who is presently taking Coumadin.    GENERAL APPEARANCE: Alert, conversant. Appropriately groomed. No acute distress.  VASCULAR: Pedal pulses are  palpable at  [facility] and PT bilateral.  Capillary refill time is immediate to all digits,  Normal temperature gradient.  Digital hair growth is present bilateral  NEUROLOGIC: sensation is normal to 5.07 monofilament at 5/5 sites bilateral.  Light touch is intact bilateral, Muscle strength normal.  MUSCULOSKELETAL: acceptable muscle strength, tone and stability bilateral.  Intrinsic muscluature intact bilateral.  Rectus appearance of foot and digits noted bilateral. Dorsal arthritis midfoot  B/L NAIL  thick disfigured discolored nails both feet.  Patient has marked incurvation noted along the lateral border of the right great toe.  Redness, swelling and pain noted along the lateral border of the right great toe DERMATOLOGIC: skin color, texture, and turgor are within normal limits.  No preulcerative lesions or ulcers  are seen, no interdigital maceration noted.  No open lesions present.  Digital nails are asymptomatic. No drainage noted.  Ingrown  Toenail right hallux  Paronychia lateral border right great toe.  ROV  Nail surgery.  Treatment options and alternatives discussed.  Recommended permanent phenol matrixectomy and patient agreed.  Right hallux  was prepped with alcohol and a toe block of 3cc of 2% lidocaine plain was administered in a digital toe  block. .  The toe was then prepped with betadine solution .  The offending nail border was then excised and matrix tissue exposed.  Phenol was then applied to the matrix tissue followed by an alcohol wash.  Antibiotic ointment and a dry sterile dressing was applied.  The patient was dispensed instructions for aftercare.  Patient was told to continue on antibiotics from  previous doctor.  RTC 1 week.     Gardiner Barefoot DPM

## 2016-08-14 ENCOUNTER — Ambulatory Visit (INDEPENDENT_AMBULATORY_CARE_PROVIDER_SITE_OTHER): Payer: Medicare Other | Admitting: *Deleted

## 2016-08-14 DIAGNOSIS — Z7901 Long term (current) use of anticoagulants: Secondary | ICD-10-CM

## 2016-08-14 DIAGNOSIS — I483 Typical atrial flutter: Secondary | ICD-10-CM

## 2016-08-14 LAB — POCT INR: INR: 1.6

## 2016-08-15 ENCOUNTER — Telehealth: Payer: Self-pay | Admitting: Internal Medicine

## 2016-08-15 ENCOUNTER — Ambulatory Visit: Payer: Medicare Other | Admitting: Podiatry

## 2016-08-15 MED ORDER — SITAGLIPTIN PHOSPHATE 100 MG PO TABS: 100.0000 mg | ORAL_TABLET | Freq: Every day | ORAL | 0 refills | Status: DC

## 2016-08-15 NOTE — Telephone Encounter (Signed)
Pt called in and said that sitaGLIPtin (JANUVIA) 100 MG tablet [[id number]]  Has to be sent thought her mail order pharmacy.  They can not take it by rescript.  It has to be mailed or faxed over

## 2016-08-15 NOTE — Telephone Encounter (Signed)
Done

## 2016-08-16 ENCOUNTER — Other Ambulatory Visit: Payer: Self-pay

## 2016-08-16 ENCOUNTER — Ambulatory Visit (INDEPENDENT_AMBULATORY_CARE_PROVIDER_SITE_OTHER): Payer: Self-pay | Admitting: Podiatry

## 2016-08-16 ENCOUNTER — Encounter: Payer: Self-pay | Admitting: Podiatry

## 2016-08-16 DIAGNOSIS — Z09 Encounter for follow-up examination after completed treatment for conditions other than malignant neoplasm: Secondary | ICD-10-CM

## 2016-08-16 NOTE — Progress Notes (Signed)
This patient presents the office with chief complaint of a continued pain along the outside border big toe, right foot.  Patient states it is painful as he drives his car and he has yet to put on regular footgear.  He says he has been soaking his foot and bandaging his foot as prescribed.  He presents the office for continued evaluation of his nail surgery, but the pain along the outside border right great toe is out of proportion to what should be seen.   GENERAL APPEARANCE: Alert, conversant. Appropriately groomed. No acute distress.  VASCULAR: Pedal pulses are  palpable at  [facility] and PT bilateral.  Capillary refill time is immediate to all digits,  Normal temperature gradient.  Digital hair growth is present bilateral  NEUROLOGIC: sensation is normal to 5.07 monofilament at 5/5 sites bilateral.  Light touch is intact bilateral, Muscle strength normal.  MUSCULOSKELETAL: acceptable muscle strength, tone and stability bilateral.  Intrinsic muscluature intact bilateral.  Rectus appearance of foot and digits noted bilateral. Dorsal arthritis midfoot  B/L NAIL  thick disfigured discolored nails both feet.  Patient has healing noted to the skin on the lateral aspect of the right hallux.  Patient still has pain elicited upon palpation of the lateral nail groove.  No evidence of any infection or pus is noted DERMATOLOGIC: skin color, texture, and turgor are within normal limits.  No preulcerative lesions or ulcers  are seen, no interdigital maceration noted.  No open lesions present.   No drainage noted.  Ingrown  Toenail right hallux  Paronychia lateral border right great toe.  ROV  Nail surgery.  Treatment options and alternatives discussed.  After evaluation of the right hallux. I decided to anesthetize  his right great toe with 2% Xylocaine plain.  After washing the toe with Betadine. The nail border was examined and found to have 2 nail fragments.  These 2 fragments were then removed and the site was  bandaged with Neosporin and dry sterile dressing.  Patient was told to continue home soaks and bandages.  Return to the clinic in one week for further evaluation and treatment     Gardiner Barefoot DPM

## 2016-08-16 NOTE — Patient Outreach (Signed)
[facility]) Care Management  08/16/2016  [name] 06-28-1945 [id number]   Telephone call to patient for initial assessment call.  Male answers and states he is not in. Advised health coach would call another time.  Plan: RN Health Coach will attempt patient again in the month of July.   Jone Baseman, RN, MSN [place] [phone]

## 2016-08-17 ENCOUNTER — Ambulatory Visit: Payer: Self-pay

## 2016-08-18 ENCOUNTER — Encounter: Payer: Self-pay | Admitting: Internal Medicine

## 2016-08-22 ENCOUNTER — Telehealth: Payer: Self-pay | Admitting: Internal Medicine

## 2016-08-22 MED ORDER — SITAGLIPTIN PHOSPHATE 100 MG PO TABS: 100.0000 mg | ORAL_TABLET | Freq: Every day | ORAL | 0 refills | Status: DC

## 2016-08-22 NOTE — Telephone Encounter (Signed)
Pt needs a refill of sitaGLIPtin (JANUVIA) 100 MG tablet States he is out this med.   [facility]

## 2016-08-22 NOTE — Telephone Encounter (Signed)
Done

## 2016-08-23 ENCOUNTER — Other Ambulatory Visit: Payer: Self-pay

## 2016-08-23 ENCOUNTER — Telehealth: Payer: Self-pay | Admitting: Internal Medicine

## 2016-08-23 MED ORDER — SITAGLIPTIN PHOSPHATE 100 MG PO TABS: 100.0000 mg | ORAL_TABLET | Freq: Every day | ORAL | 0 refills | Status: DC

## 2016-08-23 NOTE — Telephone Encounter (Signed)
Per chart MD assistant faxed script yesterday will forward msg to [name] to call patient...[name]

## 2016-08-23 NOTE — Telephone Encounter (Signed)
Med was sent to incorrect pharmacy. It has now been sent to [facility].

## 2016-08-23 NOTE — Telephone Encounter (Signed)
Patient states script for Januvia was sent to incorrect pharmacy.  States it needs to go to patient outreach pharmacy.  Patient also states he is out of medication.  Would like to know if there are samples he can get.  Please follow up in regard. Thanks!

## 2016-08-23 NOTE — Patient Outreach (Signed)
[place] [facility]) Care Management  [place]  08/23/2016   [name] [birth year] [id number]  Subjective: Telephone call to patient for initial assessment after tranfer from community nurse.  Patient reports he is doing good.  He reports that he is independent with all daily activities.  Patient reports he has not checked his sugar this morning as he had a early appointment.  But states the highest blood sugar has been 132.  Encouraged patient to continue his regimen. He verbalized understanding and states that he controls his portions and that is how he has been able to control his sugar.  Patient with recent ingrown toenail but states it is doing good and sees the foot doctor again on Friday. Patient voices no concerns.    Objective:   Encounter Medications:  Outpatient Encounter Prescriptions as of 08/23/2016  Medication Sig Note  . allopurinol (ZYLOPRIM) 300 MG tablet take 1 tablet by mouth once daily (Patient taking differently: Take 300 mg by mouth daily. )   . atorvastatin (LIPITOR) 80 MG tablet TAKE 1 TABLET BY MOUTH  DAILY   . Blood Glucose Monitoring Suppl (ONE TOUCH ULTRA 2) w/Device KIT Use as directed   . carvedilol (COREG) 25 MG tablet TAKE 1 TABLET BY MOUTH TWO  TIMES DAILY   . Cholecalciferol (VITAMIN D) 1000 UNITS capsule Take 1,000 Units by mouth 2 (two) times daily. Reported on 02/15/2015   . donepezil (ARICEPT) 5 MG tablet Take 1 tablet (5 mg total) by mouth at bedtime.   . fluocinonide cream (LIDEX) 4.09 % Apply 1 application topically 2 (two) times daily. 05/18/2016: Use prn   . fluticasone (FLONASE) 50 MCG/ACT nasal spray USE 2 SPRAYS IN EACH  NOSTRIL EVERY DAY   . furosemide (LASIX) 40 MG tablet TAKE 1 TABLET BY MOUTH  DAILY   . gabapentin (NEURONTIN) 300 MG capsule TAKE 2 CAPSULES BY MOUTH 3  TIMES DAILY   . glipiZIDE (GLUCOTROL XL) 10 MG 24 hr tablet TAKE 1 TABLET BY MOUTH  DAILY WITH BREAKFAST   . indomethacin (INDOCIN) 50 MG capsule Take 1  capsule (50 mg total) by mouth 3 (three) times daily as needed.   Marland Kitchen lisinopril (PRINIVIL,ZESTRIL) 2.5 MG tablet TAKE 1 TABLET BY MOUTH  DAILY   . metFORMIN (GLUCOPHAGE) 500 MG tablet TAKE 2 TABLETS BY MOUTH TWO TIMES DAILY   . Multiple Vitamin (MULTIVITAMIN WITH MINERALS) TABS tablet Take 1 tablet by mouth every morning.   . ONE TOUCH ULTRA TEST test strip CHECK BLOOD SUGAR TWO TIMES DAILY   . ONETOUCH DELICA LANCETS 73Z MISC USE AS DIRECTED THREE TIMES DAILY TO CHECK BLOOD SUGAR   . potassium chloride SA (K-DUR,KLOR-CON) 20 MEQ tablet TAKE 1 TABLET BY MOUTH  EVERY EVENING   . sitaGLIPtin (JANUVIA) 100 MG tablet Take 1 tablet (100 mg total) by mouth daily.   . tamsulosin (FLOMAX) 0.4 MG CAPS capsule TAKE 1 CAPSULE BY MOUTH  EVERY 12 HOURS   . tiZANidine (ZANAFLEX) 4 MG tablet TAKE 1 TABLET BY MOUTH  EVERY 6 HOURS AS NEEDED FOR MUSCLE SPASMS.   Marland Kitchen traMADol (ULTRAM) 50 MG tablet Take 1 tablet (50 mg total) by mouth every 8 (eight) hours as needed.   . warfarin (COUMADIN) 5 MG tablet TAKE 1 TABLET BY MOUTH DAILY OR AS DIRECTED BY COUMADIN CLINIC   . cephALEXin (KEFLEX) 500 MG capsule Take 1 capsule (500 mg total) by mouth 4 (four) times daily. (Patient not taking: Reported on 08/23/2016)   .  furosemide (LASIX) 80 MG tablet Take 1 tablet (80 mg total) by mouth 2 (two) times daily. (Patient not taking: Reported on 08/23/2016) 05/24/2015: .  . predniSONE (DELTASONE) 10 MG tablet 3 tabs by mouth per day for 3 days,2tabs per day for 3 days,1tab per day for 3 days (Patient not taking: Reported on 08/23/2016) 05/18/2016: Reports finished prednisone on Sunday 05/14/16   No facility-administered encounter medications on file as of 08/23/2016.     Functional Status:  In your present state of health, do you have any difficulty performing the following activities: 08/23/2016 12/14/2015  Hearing? N N  Vision? Y N  Difficulty concentrating or making decisions? N N  Walking or climbing stairs? N Y  Dressing or  bathing? N N  Doing errands, shopping? N N  Preparing Food and eating ? N N  Using the Toilet? N N  In the past six months, have you accidently leaked urine? N N  Do you have problems with loss of bowel control? N N  Managing your Medications? N N  Managing your Finances? N N  Housekeeping or managing your Housekeeping? N N  Some recent data might be hidden    Fall/Depression Screening: Fall Risk  08/23/2016 06/15/2016 05/05/2016  Falls in the past year? No No No  Number falls in past yr: - - -  Injury with Fall? - - -  Risk Factor Category  - - -  Risk for fall due to : - - -  Follow up - - -   PHQ 2/9 Scores 08/23/2016 05/05/2016 03/28/2016 12/14/2015 08/05/2015 06/15/2015 03/22/2015  PHQ - 2 Score 0 0 0 0 0 1 0    Assessment: Patient continues to benefit from health coach outreach for disease management and support.    Plan:  [facility] CM Care Plan Problem One     Most Recent Value  Care Plan Problem One  Diabetes Knowledge deficit  Role Documenting the Problem One  Health [place] for Problem One  Active  [facility] Long Term Goal   Patient will continue to keep blood sugars less than 150 within the next 90 days.   THN Long Term Goal Start Date  08/23/16  Interventions for Problem One [place] encourgaed patient to continue daily routine and limit carbohydrates and sweets.     RN Health Coach will provide ongoing education for patient on diabetes through phone calls and sending printed information to patient for further discussion.   RN Health Coach will send welcome letter.  RN Health Coach will send initial barriers letter, assessment, and care plan to primary care physician.  RN Health Coach will contact patient in the month of August and patient agrees to next outreach.  Jone Baseman, RN, MSN [place] [phone]

## 2016-08-25 ENCOUNTER — Ambulatory Visit (INDEPENDENT_AMBULATORY_CARE_PROVIDER_SITE_OTHER): Payer: Self-pay | Admitting: Podiatry

## 2016-08-25 DIAGNOSIS — Z09 Encounter for follow-up examination after completed treatment for conditions other than malignant neoplasm: Secondary | ICD-10-CM

## 2016-08-25 NOTE — Progress Notes (Signed)
This patient returns to the office following nail surgery one week ago.  The patient says toe has been soaked and bandaged as directed.  There has been improvement of the toe since the surgery has been performed. The patient presents for continued evaluation and treatment.  GENERAL APPEARANCE: Alert, conversant. Appropriately groomed. No acute distress.  VASCULAR: Pedal pulses palpable at  DP and PT bilateral.  Capillary refill time is immediate to all digits,  Normal temperature gradient.    NEUROLOGIC: sensation is normal to 5.07 monofilament at 5/5 sites bilateral.  Light touch is intact bilateral, Muscle strength normal.  MUSCULOSKELETAL: acceptable muscle strength, tone and stability bilateral.  Intrinsic muscluature intact bilateral.  Rectus appearance of foot and digits noted bilateral.   DERMATOLOGIC: skin color, texture, and turgor are within normal limits.  No preulcerative lesions or ulcers  are seen, no interdigital maceration noted.   NAILS  There is necrotic tissue along the nail groove  In the absence of redness swelling and pain.  DX  S/p nail surgery  ROV  Home instructions were discussed.  Patient to call the office if there are any questions or concerns.   Niralya Ohanian DPM   

## 2016-08-28 ENCOUNTER — Ambulatory Visit (INDEPENDENT_AMBULATORY_CARE_PROVIDER_SITE_OTHER): Payer: Medicare Other | Admitting: *Deleted

## 2016-08-28 DIAGNOSIS — Z7901 Long term (current) use of anticoagulants: Secondary | ICD-10-CM | POA: Diagnosis not present

## 2016-08-28 DIAGNOSIS — I483 Typical atrial flutter: Secondary | ICD-10-CM

## 2016-08-28 LAB — POCT INR: INR: 4.2

## 2016-08-30 ENCOUNTER — Ambulatory Visit: Payer: Medicare Other | Admitting: Internal Medicine

## 2016-09-07 MED ORDER — SITAGLIPTIN PHOSPHATE 100 MG PO TABS: 100.0000 mg | ORAL_TABLET | Freq: Every day | ORAL | 0 refills | Status: DC

## 2016-09-07 NOTE — Telephone Encounter (Signed)
Pt called in and said that he has some question about this med and needs to know what is going on with it?  He said the he has called [facility] and they do not have it

## 2016-09-07 NOTE — Telephone Encounter (Addendum)
Pharmacy was incorrect. It should go to [facility]. It was changed and Januvia resent.

## 2016-09-07 NOTE — Addendum Note (Signed)
Addended by: Juliet Rude on: 09/07/2016 10:55 AM   Modules accepted: Orders

## 2016-09-07 NOTE — Telephone Encounter (Signed)
Forwarding msg to MD assistant to contact pt with his concerns since previous msg was handle by her. [name] pls contact pt concerning msg below...Johny Chess

## 2016-09-11 ENCOUNTER — Ambulatory Visit (INDEPENDENT_AMBULATORY_CARE_PROVIDER_SITE_OTHER): Payer: Medicare Other | Admitting: *Deleted

## 2016-09-11 DIAGNOSIS — I483 Typical atrial flutter: Secondary | ICD-10-CM | POA: Diagnosis not present

## 2016-09-11 DIAGNOSIS — Z7901 Long term (current) use of anticoagulants: Secondary | ICD-10-CM | POA: Diagnosis not present

## 2016-09-11 LAB — POCT INR: INR: 2.6

## 2016-09-13 ENCOUNTER — Ambulatory Visit: Payer: Medicare Other | Admitting: Podiatry

## 2016-09-15 ENCOUNTER — Other Ambulatory Visit: Payer: Self-pay

## 2016-09-15 NOTE — Patient Outreach (Signed)
[place] [facility]) Care Management  09/15/2016  EVART MCDONNELL Jan 03, 1946 [id number]   Telephone call to patient for monthly call.  Male answered stating patient not in.  Advised that health coach calling and would call another time.  She verbalized understanding.   Plan: RN Health Coach will attempt patient again in the month of August.    Nicolas Sisler J Itzabella Sorrels, RN, MSN [place] [phone]

## 2016-09-20 ENCOUNTER — Other Ambulatory Visit: Payer: Self-pay | Admitting: Internal Medicine

## 2016-09-22 ENCOUNTER — Other Ambulatory Visit: Payer: Self-pay

## 2016-09-22 NOTE — Patient Outreach (Signed)
[place] [facility]) Care Management  [place]  09/22/2016   [name] Jan 07, 1946 [id number]  Subjective: Telephone call to patient for monthly call. Patient states he is doing ok.  He reports that he is having some problems with getting his Januvia due to forms needing to be filled out.  He reports that he has been without his medication for about one month and waiting for forms to be sent to him for him to fill out and get to the physician to complete.  Advised patient if he has trouble to please let me know and he verbalized understanding and states if he does not get it straight he would call me next week.  He reports that his sugar today was 180, which is still below the range the doctor gave him.  He states the goal is less tham 180.  Encouraged patient to continue to watch his carbohydrates and sweets.  He verbalized understanding.    Objective:   Encounter Medications:  Outpatient Encounter Prescriptions as of 09/22/2016  Medication Sig Note  . allopurinol (ZYLOPRIM) 300 MG tablet take 1 tablet by mouth once daily (Patient taking differently: Take 300 mg by mouth daily. )   . atorvastatin (LIPITOR) 80 MG tablet TAKE 1 TABLET BY MOUTH  DAILY   . Blood Glucose Monitoring Suppl (ONE TOUCH ULTRA 2) w/Device KIT Use as directed   . carvedilol (COREG) 25 MG tablet TAKE 1 TABLET BY MOUTH TWO  TIMES DAILY   . Cholecalciferol (VITAMIN D) 1000 UNITS capsule Take 1,000 Units by mouth 2 (two) times daily. Reported on 02/15/2015   . donepezil (ARICEPT) 5 MG tablet TAKE 1 TABLET BY MOUTH AT  BEDTIME   . fluocinonide cream (LIDEX) 3.23 % Apply 1 application topically 2 (two) times daily. 05/18/2016: Use prn   . fluticasone (FLONASE) 50 MCG/ACT nasal spray USE 2 SPRAYS IN EACH  NOSTRIL EVERY DAY   . furosemide (LASIX) 40 MG tablet TAKE 1 TABLET BY MOUTH  DAILY   . gabapentin (NEURONTIN) 300 MG capsule TAKE 2 CAPSULES BY MOUTH 3  TIMES DAILY   . glipiZIDE (GLUCOTROL XL) 10 MG  24 hr tablet TAKE 1 TABLET BY MOUTH  DAILY WITH BREAKFAST   . indomethacin (INDOCIN) 50 MG capsule Take 1 capsule (50 mg total) by mouth 3 (three) times daily as needed.   Marland Kitchen lisinopril (PRINIVIL,ZESTRIL) 2.5 MG tablet TAKE 1 TABLET BY MOUTH  DAILY   . metFORMIN (GLUCOPHAGE) 500 MG tablet TAKE 2 TABLETS BY MOUTH TWO TIMES DAILY   . Multiple Vitamin (MULTIVITAMIN WITH MINERALS) TABS tablet Take 1 tablet by mouth every morning.   . ONE TOUCH ULTRA TEST test strip CHECK BLOOD SUGAR TWO TIMES DAILY   . ONETOUCH DELICA LANCETS 55D MISC USE AS DIRECTED THREE TIMES DAILY TO CHECK BLOOD SUGAR   . potassium chloride SA (K-DUR,KLOR-CON) 20 MEQ tablet TAKE 1 TABLET BY MOUTH  EVERY EVENING   . sitaGLIPtin (JANUVIA) 100 MG tablet Take 1 tablet (100 mg total) by mouth daily.   . tamsulosin (FLOMAX) 0.4 MG CAPS capsule TAKE 1 CAPSULE BY MOUTH  EVERY 12 HOURS   . tiZANidine (ZANAFLEX) 4 MG tablet TAKE 1 TABLET BY MOUTH  EVERY 6 HOURS AS NEEDED FOR MUSCLE SPASMS.   Marland Kitchen traMADol (ULTRAM) 50 MG tablet Take 1 tablet (50 mg total) by mouth every 8 (eight) hours as needed.   . warfarin (COUMADIN) 5 MG tablet TAKE 1 TABLET BY MOUTH DAILY OR AS DIRECTED BY  COUMADIN CLINIC   . cephALEXin (KEFLEX) 500 MG capsule Take 1 capsule (500 mg total) by mouth 4 (four) times daily. (Patient not taking: Reported on 08/23/2016)   . furosemide (LASIX) 80 MG tablet Take 1 tablet (80 mg total) by mouth 2 (two) times daily. (Patient not taking: Reported on 08/23/2016) 05/24/2015: .  . predniSONE (DELTASONE) 10 MG tablet 3 tabs by mouth per day for 3 days,2tabs per day for 3 days,1tab per day for 3 days (Patient not taking: Reported on 08/23/2016) 05/18/2016: Reports finished prednisone on Sunday 05/14/16   No facility-administered encounter medications on file as of 09/22/2016.     Functional Status:  In your present state of health, do you have any difficulty performing the following activities: 08/23/2016 12/14/2015  Hearing? 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 Y N  Difficulty concentrating or making decisions? N N  Walking or climbing stairs? N Y  Dressing or bathing? N N  Doing errands, shopping? N N  Preparing Food and eating ? N N  Using the Toilet? N N  In the past six months, have you accidently leaked urine? N N  Do you have problems with loss of bowel control? N N  Managing your Medications? N N  Managing your Finances? N N  Housekeeping or managing your Housekeeping? N N  Some recent data might be hidden    Fall/Depression Screening: Fall Risk  09/22/2016 08/23/2016 06/15/2016  Falls in the past year? No No No  Number falls in past yr: - - -  Comment - - -  Injury with Fall? - - -  Risk Factor Category  - - -  Comment - - -  Risk for fall due to : - - -  Follow up - - -   PHQ 2/9 Scores 09/22/2016 08/23/2016 05/05/2016 03/28/2016 12/14/2015 08/05/2015 06/15/2015  PHQ - 2 Score 0 0 0 0 0 0 1    Assessment: Patient continues to benefit from health coach outreach for disease management and support.    Plan:  [facility] CM Care Plan Problem One     Most Recent Value  Care Plan Problem One  Diabetes Knowledge deficit  Role Documenting the Problem One  Health Coach  Care Plan for Problem One  Active  THN Long Term Goal   Patient will continue to keep blood sugars less than 150 within the next 90 days.   THN Long Term Goal Start Date  09/22/16 [goal continued]  Interventions for Problem One Long Term Goal  RN Health Coach reviewed with  patient to continue daily routine and limit carbohydrates and sweets.     RN Health Coach will contact patient in the month of September and patient agrees to next outreach.   J , RN, MSN [facility] Care Management RN Telephonic Health Coach [phone]   

## 2016-09-26 ENCOUNTER — Ambulatory Visit: Payer: Medicare Other | Admitting: Internal Medicine

## 2016-09-29 ENCOUNTER — Encounter: Payer: Self-pay | Admitting: Internal Medicine

## 2016-09-29 ENCOUNTER — Ambulatory Visit (INDEPENDENT_AMBULATORY_CARE_PROVIDER_SITE_OTHER): Payer: Medicare Other | Admitting: Internal Medicine

## 2016-09-29 VITALS — BP 118/76 | HR 85 | Ht 71.0 in | Wt 213.0 lb

## 2016-09-29 DIAGNOSIS — I10 Essential (primary) hypertension: Secondary | ICD-10-CM | POA: Diagnosis not present

## 2016-09-29 DIAGNOSIS — M79671 Pain in right foot: Secondary | ICD-10-CM

## 2016-09-29 DIAGNOSIS — E1165 Type 2 diabetes mellitus with hyperglycemia: Secondary | ICD-10-CM | POA: Diagnosis not present

## 2016-09-29 DIAGNOSIS — N289 Disorder of kidney and ureter, unspecified: Secondary | ICD-10-CM

## 2016-09-29 DIAGNOSIS — Z23 Encounter for immunization: Secondary | ICD-10-CM | POA: Diagnosis not present

## 2016-09-29 DIAGNOSIS — Z0000 Encounter for general adult medical examination without abnormal findings: Secondary | ICD-10-CM | POA: Diagnosis not present

## 2016-09-29 DIAGNOSIS — IMO0002 Reserved for concepts with insufficient information to code with codable children: Secondary | ICD-10-CM

## 2016-09-29 DIAGNOSIS — E1140 Type 2 diabetes mellitus with diabetic neuropathy, unspecified: Secondary | ICD-10-CM | POA: Diagnosis not present

## 2016-09-29 MED ORDER — SITAGLIPTIN PHOSPHATE 100 MG PO TABS: 100.0000 mg | ORAL_TABLET | Freq: Every day | ORAL | 3 refills | Status: DC

## 2016-09-29 MED ORDER — TRAMADOL HCL 50 MG PO TABS: 50.0000 mg | ORAL_TABLET | Freq: Three times a day (TID) | ORAL | 0 refills | Status: DC | PRN

## 2016-09-29 MED ORDER — HYDROCODONE-ACETAMINOPHEN 10-325 MG PO TABS: 1.0000 | ORAL_TABLET | Freq: Every day | ORAL | 0 refills | Status: DC | PRN

## 2016-09-29 NOTE — Progress Notes (Signed)
Subjective:    Patient ID: [name], male    DOB: 1945/06/27, 71 y.o.   MRN: [id number]  HPI    Here to f/u, concerned about mod pain and soreness worst at a built up area of skin to the plantar aspect of the right 5th MTP. No ulceration, redness, fluctuation or abscess.  No red streaks or fever.   Asks for pain med for foot, as well as prn hydrocodone for severe other chronic pain flares, which he takes sparingly .   Pt denies polydipsia, polyuria, or low sugar symptoms such as weakness or confusion improved with po intake.  Pt states overall good compliance with meds, trying to follow lower cholesterol, diabetic diet, wt overall stable but little exercise however.   Needs Januvia patient assist form renewed and this is done.  Pt denies chest pain, increased sob or doe, wheezing, orthopnea, PND, increased LE swelling, palpitations, dizziness or syncope. Is pressed for time, wants to return next wk for labs  Due for flu shot Past Medical History:  Diagnosis Date  . Aortic root dilatation ([place]) 02/02/2011  . Arthritis    "lower back; going back down both my sciatic nerves"  . 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 BENIGN PROSTATIC HYPERTROPHY 08/29/2006   takes Flomax daily  . 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 Cataracts, bilateral    immature  . CHF (congestive heart failure) (HCC)    takes Lasix daily  . Chronic back pain    HNP   . CORONARY ARTERY DISEASE 08/29/2006   takes Coumadin daily  . DEPRESSION 08/29/2006  . DIABETES MELLITUS, TYPE II 08/29/2006   takes Metformin,Januvia,and Glipizide  daily  . DIASTOLIC HEART FAILURE, CHRONIC 06/09/2008  . GOUT 04/22/2007   takes Allopurinol daily  . History of migraine    27yr ago  . HYPERLIPIDEMIA 08/29/2006   takes Atorvastatin daily  . HYPERTENSION 08/29/2006   takes Lisinopril daily  . INSOMNIA-SLEEP DISORDER-UNSPEC 10/23/2007  . Left lumbar radiculopathy 05/30/2010  . LUMBAR RADICULOPATHY, RIGHT 06/10/2007  .  Muscle spasm    takes Zanaflex daily  . [place]10   "I've had several MIs"  . 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 PACEMAKER, PERMANENT 03/04/2008   pt denies this date  . Peripheral neuropathy    takes Gabapentin daily  . Presence of permanent cardiac pacemaker   . Shortness of breath dyspnea    "all my life" with exertion  . Sleep apnea    "if I lay flat I quit breathing; HOB up I'm fine"   Past Surgical History:  Procedure Laterality Date  . COLONOSCOPY    . CORONARY ANGIOPLASTY WITH STENT PLACEMENT  12/27/10   "I've had a total of 9 cardiac stents put in"  . CORONARY ARTERY BYPASS GRAFT  1992   CABG X 2  . ESOPHAGOGASTRODUODENOSCOPY N/A 12/01/2013   Procedure: ESOPHAGOGASTRODUODENOSCOPY (EGD);  Surgeon: DLafayette Dragon MD;  [facility];  Service: Endoscopy;  Laterality: N/A;  . INSERT / REPLACE / REMOVE PACEMAKER  ~ 2004   initial pacemaker placement  . INSERT / REPLACE / REMOVE PACEMAKER  10/2009   generator change  . LUMBAR LAMINECTOMY/DECOMPRESSION MICRODISCECTOMY Right 03/23/2014   Procedure: LAMINECTOMY AND FORAMINOTOMY RIGHT LUMBAR THREE-FOUR,LUMBAR FOUR-FIVE, LUMBAR FIVE-SACRAL ONE;  Surgeon: HCharlie Pitter MD;  [facility];  Service: Neurosurgery;  Laterality: Right;  right  . LUMBAR LAMINECTOMY/DECOMPRESSION MICRODISCECTOMY Left 12/01/2014   Procedure: Left Lumbar Three-Four, Lumbar Four-Five Laminectomy  and Foraminotomy;  Surgeon: Earnie Larsson, MD;  Location: [facility];  Service: Neurosurgery;  Laterality: Left;  . s/p left arm surgury after work accident  1991   "2000# steel fell on it"  . s/p right hand surgury for foreign object  1970's   "piece of wood went in my hand; had to get that out"    reports that he quit smoking about 43 years ago. His smoking use included Cigarettes. He has a 27.00 pack-year smoking history. He has quit using smokeless tobacco. He reports that he does not drink alcohol or use  drugs. family history includes Coronary artery disease (age of onset: 67) in his other; Diabetes in his mother, other, and sister; Heart disease in his sister and sister; Lung cancer in his sister. Allergies  Allergen Reactions  . Crestor [Rosuvastatin Calcium] Other (See Comments)    Urination of blood  . Latex Rash and Other (See Comments)    Gloves cause welts on hands!!   Current Outpatient Prescriptions on File Prior to Visit  Medication Sig Dispense Refill  . allopurinol (ZYLOPRIM) 300 MG tablet take 1 tablet by mouth once daily (Patient taking differently: Take 300 mg by mouth daily. ) 90 tablet 3  . atorvastatin (LIPITOR) 80 MG tablet TAKE 1 TABLET BY MOUTH  DAILY 90 tablet 2  . Blood Glucose Monitoring Suppl (ONE TOUCH ULTRA 2) w/Device KIT Use as directed 1 each 3  . carvedilol (COREG) 25 MG tablet TAKE 1 TABLET BY MOUTH TWO  TIMES DAILY 180 tablet 2  . Cholecalciferol (VITAMIN D) 1000 UNITS capsule Take 1,000 Units by mouth 2 (two) times daily. Reported on 02/15/2015    . donepezil (ARICEPT) 5 MG tablet TAKE 1 TABLET BY MOUTH AT  BEDTIME 90 tablet 1  . fluocinonide cream (LIDEX) 7.59 % Apply 1 application topically 2 (two) times daily. 30 g 2  . fluticasone (FLONASE) 50 MCG/ACT nasal spray USE 2 SPRAYS IN EACH  NOSTRIL EVERY DAY 48 g 2  . furosemide (LASIX) 40 MG tablet TAKE 1 TABLET BY MOUTH  DAILY 90 tablet 2  . furosemide (LASIX) 80 MG tablet Take 1 tablet (80 mg total) by mouth 2 (two) times daily. 60 tablet 5  . gabapentin (NEURONTIN) 300 MG capsule TAKE 2 CAPSULES BY MOUTH 3  TIMES DAILY 540 capsule 2  . glipiZIDE (GLUCOTROL XL) 10 MG 24 hr tablet TAKE 1 TABLET BY MOUTH  DAILY WITH BREAKFAST 90 tablet 2  . indomethacin (INDOCIN) 50 MG capsule Take 1 capsule (50 mg total) by mouth 3 (three) times daily as needed. 270 capsule 3  . lisinopril (PRINIVIL,ZESTRIL) 2.5 MG tablet TAKE 1 TABLET BY MOUTH  DAILY 90 tablet 2  . metFORMIN (GLUCOPHAGE) 500 MG tablet TAKE 2 TABLETS BY MOUTH  TWO TIMES DAILY 360 tablet 2  . Multiple Vitamin (MULTIVITAMIN WITH MINERALS) TABS tablet Take 1 tablet by mouth every morning.    . ONE TOUCH ULTRA TEST test strip CHECK BLOOD SUGAR TWO TIMES DAILY 100 each 2  . ONETOUCH DELICA LANCETS 16B MISC USE AS DIRECTED THREE TIMES DAILY TO CHECK BLOOD SUGAR 300 each 2  . potassium chloride SA (K-DUR,KLOR-CON) 20 MEQ tablet TAKE 1 TABLET BY MOUTH  EVERY EVENING 90 tablet 2  . tamsulosin (FLOMAX) 0.4 MG CAPS capsule TAKE 1 CAPSULE BY MOUTH  EVERY 12 HOURS 180 capsule 2  . tiZANidine (ZANAFLEX) 4 MG tablet TAKE 1 TABLET BY MOUTH  EVERY 6 HOURS AS NEEDED FOR MUSCLE SPASMS. [place]  tablet 0  . warfarin (COUMADIN) 5 MG tablet TAKE 1 TABLET BY MOUTH DAILY OR AS DIRECTED BY COUMADIN CLINIC 90 tablet 1   No current facility-administered medications on file prior to visit.    Review of Systems  Constitutional: Negative for other unusual diaphoresis or sweats HENT: Negative for ear discharge or swelling Eyes: Negative for other worsening visual disturbances Respiratory: Negative for stridor or other swelling  Gastrointestinal: Negative for worsening distension or other blood Genitourinary: Negative for retention or other urinary change Musculoskeletal: Negative for other MSK pain or swelling Skin: Negative for color change or other new lesions Neurological: Negative for worsening tremors and other numbness  Psychiatric/Behavioral: Negative for worsening agitation or other fatigue All other system neg per pt    Objective:   Physical Exam BP 118/76   Pulse 85   Ht 5' 11" (1.803 m)   Wt 213 lb (96.6 kg)   SpO2 97%   BMI 29.71 kg/m  VS noted,  Constitutional: Pt appears in NAD HENT: Head: NCAT.  Right Ear: External ear normal.  Left Ear: External ear normal.  Eyes: . Pupils are equal, round, and reactive to light. Conjunctivae and EOM are normal Nose: without d/c or deformity Neck: Neck supple. Gross normal ROM Cardiovascular: Normal rate and regular  rhythm.   Pulmonary/Chest: Effort normal and breath sounds without rales or wheezing.  Abd:  Soft, NT, ND, + BS, no organomegaly Neurological: Pt is alert. At baseline orientation, motor grossly intact Skin: Skin is warm. No rashes, other new lesions, no LE edema; right foot plantar aspect 5th MTP with mod callosity and tenderness without ulcer or other breakdown, no erythema or drainage Psychiatric: Pt behavior is normal without agitation  No other exam findings Lab Results  Component Value Date   WBC 4.8 03/28/2016   HGB 11.3 (L) 03/28/2016   HCT 34.7 (L) 03/28/2016   PLT 141.0 (L) 03/28/2016   GLUCOSE 113 (H) 03/28/2016   CHOL 99 03/28/2016   TRIG 52.0 03/28/2016   HDL 43.40 03/28/2016   LDLCALC 45 03/28/2016   ALT 36 03/28/2016   AST 29 03/28/2016   NA 142 03/28/2016   K 5.7 (H) 03/28/2016   CL 108 03/28/2016   CREATININE 0.89 03/28/2016   BUN 12 03/28/2016   CO2 28 03/28/2016   TSH 1.23 03/28/2016   PSA 3.06 06/15/2015   INR 2.6 09/11/2016   HGBA1C 6.6 07/28/2016   MICROALBUR 1.3 03/28/2016       Assessment & Plan:

## 2016-09-29 NOTE — Assessment & Plan Note (Signed)
Due to aggrevation of callous in DM pt; for pain control but needs f/u with podiatry as well (pt states will call)

## 2016-09-29 NOTE — Assessment & Plan Note (Signed)
stable overall by history and exam, recent data reviewed with pt, and pt to continue medical treatment as before,  to f/u any worsening symptoms or concerns Lab Results  Component Value Date   CREATININE 0.89 03/28/2016  for f/u lab

## 2016-09-29 NOTE — Assessment & Plan Note (Signed)
stable overall by history and exam, recent data reviewed with pt, and pt to continue medical treatment as before,  to f/u any worsening symptoms or concerns Lab Results  Component Value Date   HGBA1C 6.6 07/28/2016  for f/u lab

## 2016-09-29 NOTE — Patient Instructions (Addendum)
You had the flu shot today  Your form will be sent to the drug company for the [place]  Please take all new medication as prescribed - the tramadol for the foot pain  Please continue all other medications as before, and refills have been done if requested - the hdyrocodone ONLY for severe pain flares  Please have the pharmacy call with any other refills you may need.  Please continue your efforts at being more active, low cholesterol diet, and weight control.  Please keep your appointments with your specialists as you may have planned  Please go to the LAB in the Basement (turn left off the elevator) for the tests to be done today  You will be contacted by phone if any changes need to be made immediately.  Otherwise, you will receive a letter about your results with an explanation, but please check with MyChart first.  Please remember to sign up for MyChart if you have not done so, as this will be important to you in the future with finding out test results, communicating by private email, and scheduling acute appointments online when needed.  Please return in 6 months, or sooner if needed

## 2016-09-29 NOTE — Assessment & Plan Note (Signed)
stable overall by history and exam, recent data reviewed with pt, and pt to continue medical treatment as before,  to f/u any worsening symptoms or concerns BP Readings from Last 3 Encounters:  09/29/16 118/76  08/08/16 130/64  07/28/16 124/76

## 2016-10-09 ENCOUNTER — Ambulatory Visit (INDEPENDENT_AMBULATORY_CARE_PROVIDER_SITE_OTHER): Payer: Medicare Other | Admitting: *Deleted

## 2016-10-09 DIAGNOSIS — I483 Typical atrial flutter: Secondary | ICD-10-CM | POA: Diagnosis not present

## 2016-10-09 DIAGNOSIS — I4891 Unspecified atrial fibrillation: Secondary | ICD-10-CM | POA: Diagnosis not present

## 2016-10-09 DIAGNOSIS — Z7901 Long term (current) use of anticoagulants: Secondary | ICD-10-CM

## 2016-10-09 LAB — POCT INR: INR: 4.3

## 2016-10-12 ENCOUNTER — Telehealth: Payer: Self-pay | Admitting: Internal Medicine

## 2016-10-12 MED ORDER — SITAGLIPTIN PHOSPHATE 100 MG PO TABS
100.0000 mg | ORAL_TABLET | Freq: Every day | ORAL | 3 refills | Status: DC
Start: 2016-10-12 — End: 2017-05-25

## 2016-10-12 NOTE — Telephone Encounter (Signed)
Called patient and informed him to check with his patient assistance program and see if it is an issue there. He stated that "If it is going to be this much trouble to get a medicine then he will not take it". He then went on to say "this makes me upset and I'm not trying to have a heart attack".

## 2016-10-12 NOTE — Telephone Encounter (Signed)
Please advise 

## 2016-10-12 NOTE — Telephone Encounter (Signed)
Pt called back stating that he spoke with [facility] and they did not get the prescription. He said "If it does not go through this time I am not taking the medicine... I am tired of fighting trying to get this".

## 2016-10-12 NOTE — Telephone Encounter (Signed)
Please remind the patient that his difficulty with patient assistance program is unfortunate but out of our control  Any assistance we provide in this regard is Courtesy related, and is not required by [place] for his overall healthcare.  We will try to send again - Done hardcopy to [employer]

## 2016-10-12 NOTE — Telephone Encounter (Signed)
I'm not sure how else to explain to this patient that the medication has been sent to [facility]. We have this conversation every month, he brings in forms for a patient assistance program that we fill out and mail back in with a printed prescription for him to be able to get financial help.   --I have called the patient, he believes [facility] has an old script that was sent in that they are trying to fill. He states that he has received the meds from [facility]. He will call [facility] to confirm that they did receive the paperwork and the script from his last OV and give me a call back  [name]: [name].

## 2016-10-12 NOTE — Telephone Encounter (Signed)
Pt called regarding his Januvia prescription. He said that he got a call from [facility] stating that he owes them over $200 for his Januvia rx. He said that this was to go to [facility]. I see the correct pharmacy in the med refill. Do you know where it was faxed? He needs it sent to [employer] as soon as possible.

## 2016-10-12 NOTE — Addendum Note (Signed)
Addended by: Biagio Borg on: 10/12/2016 12:29 PM   Modules accepted: Orders

## 2016-10-13 ENCOUNTER — Other Ambulatory Visit: Payer: Self-pay

## 2016-10-13 NOTE — Patient Outreach (Signed)
[facility]) Care Management  [place]  10/13/2016   [name] 1945/08/20 [id number]  Subjective: Telephone call to patient for monthly call patient reports he is doing ok. He reports that his last blood sugar was around 180. Advised patient to continue his regimen and keeping his sugars within doctor range.  He verbalized understanding.   Objective:   Encounter Medications:  Outpatient Encounter Prescriptions as of 10/13/2016  Medication Sig Note  . allopurinol (ZYLOPRIM) 300 MG tablet take 1 tablet by mouth once daily (Patient taking differently: Take 300 mg by mouth daily. )   . atorvastatin (LIPITOR) 80 MG tablet TAKE 1 TABLET BY MOUTH  DAILY   . Blood Glucose Monitoring Suppl (ONE TOUCH ULTRA 2) w/Device KIT Use as directed   . carvedilol (COREG) 25 MG tablet TAKE 1 TABLET BY MOUTH TWO  TIMES DAILY   . Cholecalciferol (VITAMIN D) 1000 UNITS capsule Take 1,000 Units by mouth 2 (two) times daily. Reported on 02/15/2015   . donepezil (ARICEPT) 5 MG tablet TAKE 1 TABLET BY MOUTH AT  BEDTIME   . fluocinonide cream (LIDEX) 7.42 % Apply 1 application topically 2 (two) times daily. 05/18/2016: Use prn   . fluticasone (FLONASE) 50 MCG/ACT nasal spray USE 2 SPRAYS IN EACH  NOSTRIL EVERY DAY   . furosemide (LASIX) 40 MG tablet TAKE 1 TABLET BY MOUTH  DAILY 10/13/2016: Taking 1 tablet BID   . gabapentin (NEURONTIN) 300 MG capsule TAKE 2 CAPSULES BY MOUTH 3  TIMES DAILY   . glipiZIDE (GLUCOTROL XL) 10 MG 24 hr tablet TAKE 1 TABLET BY MOUTH  DAILY WITH BREAKFAST   . HYDROcodone-acetaminophen (NORCO) 10-325 MG tablet Take 1 tablet by mouth daily as needed.   . indomethacin (INDOCIN) 50 MG capsule Take 1 capsule (50 mg total) by mouth 3 (three) times daily as needed.   Marland Kitchen lisinopril (PRINIVIL,ZESTRIL) 2.5 MG tablet TAKE 1 TABLET BY MOUTH  DAILY   . metFORMIN (GLUCOPHAGE) 500 MG tablet TAKE 2 TABLETS BY MOUTH TWO TIMES DAILY   . Multiple Vitamin (MULTIVITAMIN WITH  MINERALS) TABS tablet Take 1 tablet by mouth every morning.   . ONE TOUCH ULTRA TEST test strip CHECK BLOOD SUGAR TWO TIMES DAILY   . ONETOUCH DELICA LANCETS 59D MISC USE AS DIRECTED THREE TIMES DAILY TO CHECK BLOOD SUGAR   . potassium chloride SA (K-DUR,KLOR-CON) 20 MEQ tablet TAKE 1 TABLET BY MOUTH  EVERY EVENING   . sitaGLIPtin (JANUVIA) 100 MG tablet Take 1 tablet (100 mg total) by mouth daily.   . tamsulosin (FLOMAX) 0.4 MG CAPS capsule TAKE 1 CAPSULE BY MOUTH  EVERY 12 HOURS   . tiZANidine (ZANAFLEX) 4 MG tablet TAKE 1 TABLET BY MOUTH  EVERY 6 HOURS AS NEEDED FOR MUSCLE SPASMS.   Marland Kitchen traMADol (ULTRAM) 50 MG tablet Take 1 tablet (50 mg total) by mouth every 8 (eight) hours as needed.   . warfarin (COUMADIN) 5 MG tablet TAKE 1 TABLET BY MOUTH DAILY OR AS DIRECTED BY COUMADIN CLINIC   . furosemide (LASIX) 80 MG tablet Take 1 tablet (80 mg total) by mouth 2 (two) times daily. (Patient not taking: Reported on 10/13/2016) 05/24/2015: .   No facility-administered encounter medications on file as of 10/13/2016.     Functional Status:  In your present state of health, do you have any difficulty performing the following activities: 08/23/2016 12/14/2015  Hearing? 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 Y N  Difficulty concentrating  or making decisions? N N  Walking or climbing stairs? N Y  Dressing or bathing? N N  Doing errands, shopping? N N  Preparing Food and eating ? N N  Using the Toilet? N N  In the past six months, have you accidently leaked urine? N N  Do you have problems with loss of bowel control? N N  Managing your Medications? N N  Managing your Finances? N N  Housekeeping or managing your Housekeeping? N N  Some recent data might be hidden    Fall/Depression Screening: Fall Risk  10/13/2016 09/22/2016 08/23/2016  Falls in the past year? No No No  Number falls in past yr: - - -  Comment - - -  Injury with Fall? - - -  Risk Factor Category  - - -  Comment - - -  Risk  for fall due to : - - -  Follow up - - -   PHQ 2/9 Scores 10/13/2016 09/22/2016 08/23/2016 05/05/2016 03/28/2016 12/14/2015 08/05/2015  PHQ - 2 Score 0 0 0 0 0 0 0    Assessment: Patient continues to benefit from health coach outreach for disease management and support.    Plan:  [facility] CM Care Plan Problem One     Most Recent Value  Care Plan Problem One  Diabetes Knowledge deficit  Role Documenting the Problem One  Health [place] for Problem One  Active  [facility] Long Term Goal   Patient will continue to keep blood sugars less than 150 within the next 90 days.   [facility] Long Term Goal Start Date  10/13/16 [goal continued]  Interventions for Problem One Long Term Goal  RN Health Coach discussed with  patient to continue daily routine and limit carbohydrates and sweets.     RN Health Coach will contact patient in the month of October and patient agrees to next outreach.  Jone Baseman, RN, MSN [place] [phone]

## 2016-10-17 ENCOUNTER — Other Ambulatory Visit (INDEPENDENT_AMBULATORY_CARE_PROVIDER_SITE_OTHER): Payer: Medicare Other

## 2016-10-17 ENCOUNTER — Ambulatory Visit (INDEPENDENT_AMBULATORY_CARE_PROVIDER_SITE_OTHER): Payer: Medicare Other | Admitting: Internal Medicine

## 2016-10-17 ENCOUNTER — Encounter: Payer: Self-pay | Admitting: Internal Medicine

## 2016-10-17 VITALS — BP 134/82 | HR 84 | Temp 97.8°F | Ht 71.0 in | Wt 216.0 lb

## 2016-10-17 DIAGNOSIS — I10 Essential (primary) hypertension: Secondary | ICD-10-CM | POA: Diagnosis not present

## 2016-10-17 DIAGNOSIS — R350 Frequency of micturition: Secondary | ICD-10-CM

## 2016-10-17 DIAGNOSIS — E1140 Type 2 diabetes mellitus with diabetic neuropathy, unspecified: Secondary | ICD-10-CM | POA: Diagnosis not present

## 2016-10-17 DIAGNOSIS — M79605 Pain in left leg: Secondary | ICD-10-CM | POA: Diagnosis not present

## 2016-10-17 DIAGNOSIS — IMO0002 Reserved for concepts with insufficient information to code with codable children: Secondary | ICD-10-CM

## 2016-10-17 DIAGNOSIS — E1165 Type 2 diabetes mellitus with hyperglycemia: Secondary | ICD-10-CM

## 2016-10-17 LAB — BASIC METABOLIC PANEL
BUN: 18 mg/dL (ref 6–23)
CO2: 29 mEq/L (ref 19–32)
Calcium: 9.6 mg/dL (ref 8.4–10.5)
Chloride: 103 mEq/L (ref 96–112)
Creatinine, Ser: 0.87 mg/dL (ref 0.40–1.50)
GFR: 92.03 mL/min (ref >60.00)
Glucose, Bld: 141 mg/dL — ABNORMAL HIGH (ref 70–99)
POTASSIUM: 4.6 meq/L (ref 3.5–5.1)
Sodium: 139 mEq/L (ref 135–145)

## 2016-10-17 LAB — LIPID PANEL
CHOL/HDL RATIO: 2
Cholesterol: 101 mg/dL (ref 0–200)
HDL: 46.8 mg/dL (ref >39.00)
LDL CALC: 42 mg/dL (ref 0–99)
NonHDL: 53.85
TRIGLYCERIDES: 60 mg/dL (ref 0.0–149.0)
VLDL: 12 mg/dL (ref 0.0–40.0)

## 2016-10-17 LAB — HEPATIC FUNCTION PANEL
ALT: 37 U/L (ref 0–53)
AST: 30 U/L (ref 0–37)
Albumin: 4.1 g/dL (ref 3.5–5.2)
Alkaline Phosphatase: 74 U/L (ref 39–117)
BILIRUBIN DIRECT: 0.2 mg/dL (ref 0.0–0.3)
BILIRUBIN TOTAL: 0.7 mg/dL (ref 0.2–1.2)
Total Protein: 7 g/dL (ref 6.0–8.3)

## 2016-10-17 LAB — HEMOGLOBIN A1C: Hgb A1c MFr Bld: 7.9 % — ABNORMAL HIGH (ref 4.6–6.5)

## 2016-10-17 MED ORDER — OXYBUTYNIN CHLORIDE ER 10 MG PO TB24: 10.0000 mg | ORAL_TABLET | Freq: Every day | ORAL | 3 refills | Status: DC

## 2016-10-17 NOTE — Progress Notes (Signed)
Subjective:    Patient ID: [name], male    DOB: 06-18-1945, 71 y.o.   MRN: [id number]  HPI  Here to f/u, Denies urinary symptoms such as dysuria, urgency, flank pain, hematuria or n/v, fever, chills, but having several months urinary frequency with wetting accidents. Flow is ok but cant seem to control at times.  Has bilat hearing loss without wax impactions today, is considering ENT referral but not certain he wants this, as hearing aids are so expensive.  Pt denies chest pain, increased sob or doe, wheezing, orthopnea, PND, increased LE swelling, palpitations, dizziness or syncope.   Pt denies polydipsia, polyuria  Out of januvia x 1 month due to logistics problem so he figures a1c may be mildly increased over baseline.  Also having left lateral leg intermittent mild shooting pains over the last few months but no LBP or worsening LE numbness or weakness.  Pain is worse to walk.   Pt denies fever, wt loss, night sweats, loss of appetite, or other constitutional symptoms Past Medical History:  Diagnosis Date  . Aortic root dilatation ([place]) 02/02/2011  . Arthritis    "lower back; going back down both my sciatic nerves"  . 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 BENIGN PROSTATIC HYPERTROPHY 08/29/2006   takes Flomax daily  . 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 Cataracts, bilateral    immature  . CHF (congestive heart failure) (HCC)    takes Lasix daily  . Chronic back pain    HNP   . CORONARY ARTERY DISEASE 08/29/2006   takes Coumadin daily  . DEPRESSION 08/29/2006  . DIABETES MELLITUS, TYPE II 08/29/2006   takes Metformin,Januvia,and Glipizide  daily  . DIASTOLIC HEART FAILURE, CHRONIC 06/09/2008  . GOUT 04/22/2007   takes Allopurinol daily  . History of migraine    34yr ago  . HYPERLIPIDEMIA 08/29/2006   takes Atorvastatin daily  . HYPERTENSION 08/29/2006   takes Lisinopril daily  . INSOMNIA-SLEEP DISORDER-UNSPEC 10/23/2007  . Left lumbar radiculopathy 05/30/2010    . LUMBAR RADICULOPATHY, RIGHT 06/10/2007  . Muscle spasm    takes Zanaflex daily  . Myocardial infarction ([place] 12/27/10   "I've had several MIs"  . 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 PACEMAKER, PERMANENT 03/04/2008   pt denies this date  . Peripheral neuropathy    takes Gabapentin daily  . Presence of permanent cardiac pacemaker   . Shortness of breath dyspnea    "all my life" with exertion  . Sleep apnea    "if I lay flat I quit breathing; HOB up I'm fine"   Past Surgical History:  Procedure Laterality Date  . COLONOSCOPY    . CORONARY ANGIOPLASTY WITH STENT PLACEMENT  12/27/10   "I've had a total of 9 cardiac stents put in"  . CORONARY ARTERY BYPASS GRAFT  1992   CABG X 2  . ESOPHAGOGASTRODUODENOSCOPY N/A 12/01/2013   Procedure: ESOPHAGOGASTRODUODENOSCOPY (EGD);  Surgeon: DLafayette Dragon MD;  [facility];  Service: Endoscopy;  Laterality: N/A;  . INSERT / REPLACE / REMOVE PACEMAKER  ~ 2004   initial pacemaker placement  . INSERT / REPLACE / REMOVE PACEMAKER  10/2009   generator change  . LUMBAR LAMINECTOMY/DECOMPRESSION MICRODISCECTOMY Right 03/23/2014   Procedure: LAMINECTOMY AND FORAMINOTOMY RIGHT LUMBAR THREE-FOUR,LUMBAR FOUR-FIVE, LUMBAR FIVE-SACRAL ONE;  Surgeon: HCharlie Pitter MD;  [facility];  Service: Neurosurgery;  Laterality: Right;  right  . LUMBAR LAMINECTOMY/DECOMPRESSION MICRODISCECTOMY Left 12/01/2014   Procedure: Left Lumbar Three-Four, Lumbar  Four-Five Laminectomy and Foraminotomy;  Surgeon: Earnie Larsson, MD;  Location: [place] [facility];  Service: Neurosurgery;  Laterality: Left;  . s/p left arm surgury after work accident  1991   "2000# steel fell on it"  . s/p right hand surgury for foreign object  1970's   "piece of wood went in my hand; had to get that out"    reports that he quit smoking about 43 years ago. His smoking use included Cigarettes. He has a 27.00 pack-year smoking history. He has quit using smokeless tobacco. He reports  that he does not drink alcohol or use drugs. family history includes Coronary artery disease (age of onset: 35) in his other; Diabetes in his mother, other, and sister; Heart disease in his sister and sister; Lung cancer in his sister. Allergies  Allergen Reactions  . Crestor [Rosuvastatin Calcium] Other (See Comments)    Urination of blood  . Latex Rash and Other (See Comments)    Gloves cause welts on hands!!   Current Outpatient Prescriptions on File Prior to Visit  Medication Sig Dispense Refill  . allopurinol (ZYLOPRIM) 300 MG tablet take 1 tablet by mouth once daily (Patient taking differently: Take 300 mg by mouth daily. ) 90 tablet 3  . atorvastatin (LIPITOR) 80 MG tablet TAKE 1 TABLET BY MOUTH  DAILY 90 tablet 2  . Blood Glucose Monitoring Suppl (ONE TOUCH ULTRA 2) w/Device KIT Use as directed 1 each 3  . carvedilol (COREG) 25 MG tablet TAKE 1 TABLET BY MOUTH TWO  TIMES DAILY 180 tablet 2  . Cholecalciferol (VITAMIN D) 1000 UNITS capsule Take 1,000 Units by mouth 2 (two) times daily. Reported on 02/15/2015    . donepezil (ARICEPT) 5 MG tablet TAKE 1 TABLET BY MOUTH AT  BEDTIME 90 tablet 1  . fluocinonide cream (LIDEX) 2.40 % Apply 1 application topically 2 (two) times daily. 30 g 2  . fluticasone (FLONASE) 50 MCG/ACT nasal spray USE 2 SPRAYS IN EACH  NOSTRIL EVERY DAY 48 g 2  . furosemide (LASIX) 40 MG tablet TAKE 1 TABLET BY MOUTH  DAILY 90 tablet 2  . furosemide (LASIX) 80 MG tablet Take 1 tablet (80 mg total) by mouth 2 (two) times daily. 60 tablet 5  . gabapentin (NEURONTIN) 300 MG capsule TAKE 2 CAPSULES BY MOUTH 3  TIMES DAILY 540 capsule 2  . glipiZIDE (GLUCOTROL XL) 10 MG 24 hr tablet TAKE 1 TABLET BY MOUTH  DAILY WITH BREAKFAST 90 tablet 2  . HYDROcodone-acetaminophen (NORCO) 10-325 MG tablet Take 1 tablet by mouth daily as needed. 30 tablet 0  . indomethacin (INDOCIN) 50 MG capsule Take 1 capsule (50 mg total) by mouth 3 (three) times daily as needed. 270 capsule 3  .  lisinopril (PRINIVIL,ZESTRIL) 2.5 MG tablet TAKE 1 TABLET BY MOUTH  DAILY 90 tablet 2  . metFORMIN (GLUCOPHAGE) 500 MG tablet TAKE 2 TABLETS BY MOUTH TWO TIMES DAILY 360 tablet 2  . Multiple Vitamin (MULTIVITAMIN WITH MINERALS) TABS tablet Take 1 tablet by mouth every morning.    . ONE TOUCH ULTRA TEST test strip CHECK BLOOD SUGAR TWO TIMES DAILY 100 each 2  . ONETOUCH DELICA LANCETS 97D MISC USE AS DIRECTED THREE TIMES DAILY TO CHECK BLOOD SUGAR 300 each 2  . potassium chloride SA (K-DUR,KLOR-CON) 20 MEQ tablet TAKE 1 TABLET BY MOUTH  EVERY EVENING 90 tablet 2  . sitaGLIPtin (JANUVIA) 100 MG tablet Take 1 tablet (100 mg total) by mouth daily. 90 tablet 3  . tamsulosin (  FLOMAX) 0.4 MG CAPS capsule TAKE 1 CAPSULE BY MOUTH  EVERY 12 HOURS 180 capsule 2  . tiZANidine (ZANAFLEX) 4 MG tablet TAKE 1 TABLET BY MOUTH  EVERY 6 HOURS AS NEEDED FOR MUSCLE SPASMS. 180 tablet 0  . traMADol (ULTRAM) 50 MG tablet Take 1 tablet (50 mg total) by mouth every 8 (eight) hours as needed. 60 tablet 0  . warfarin (COUMADIN) 5 MG tablet TAKE 1 TABLET BY MOUTH DAILY OR AS DIRECTED BY COUMADIN CLINIC 90 tablet 1   No current facility-administered medications on file prior to visit.    Review of Systems  Constitutional: Negative for other unusual diaphoresis or sweats HENT: Negative for ear discharge or swelling Eyes: Negative for other worsening visual disturbances Respiratory: Negative for stridor or other swelling  Gastrointestinal: Negative for worsening distension or other blood Genitourinary: Negative for retention or other urinary change Musculoskeletal: Negative for other MSK pain or swelling Skin: Negative for color change or other new lesions Neurological: Negative for worsening tremors and other numbness  Psychiatric/Behavioral: Negative for worsening agitation or other fatigue All other system neg per pt    Objective:   Physical Exam BP 134/82   Pulse 84   Temp 97.8 F (36.6 C) (Oral)   Ht _0   (1.803 m)   Wt 216 lb (98 kg)   SpO2 96%   BMI 30.13 kg/m  VS noted, not ill appaering Constitutional: Pt appears in NAD HENT: Head: NCAT.  Right Ear: External ear normal.  Left Ear: External ear normal.  Eyes: . Pupils are equal, round, and reactive to light. Conjunctivae and EOM are normal Nose: without d/c or deformity Neck: Neck supple. Gross normal ROM Cardiovascular: Normal rate and regular rhythm.   Pulmonary/Chest: Effort normal and breath sounds without rales or wheezing.  Left calf - NT, no swelling or rashj Neurological: Pt is alert. At baseline orientation, motor grossly intact Skin: Skin is warm. No rashes, other new lesions, no LE edema Psychiatric: Pt behavior is normal without agitation  No other exam findings Lab Results  Component Value Date   WBC 4.8 03/28/2016   HGB 11.3 (L) 03/28/2016   HCT 34.7 (L) 03/28/2016   PLT 141.0 (L) 03/28/2016   GLUCOSE 141 (H) 10/17/2016   CHOL 101 10/17/2016   TRIG 60.0 10/17/2016   HDL 46.80 10/17/2016   LDLCALC 42 10/17/2016   ALT 37 10/17/2016   AST 30 10/17/2016   NA 139 10/17/2016   K 4.6 10/17/2016   CL 103 10/17/2016   CREATININE 0.87 10/17/2016   BUN 18 10/17/2016   CO2 29 10/17/2016   TSH 1.23 03/28/2016   PSA 3.06 06/15/2015   INR 4.3 10/09/2016   HGBA1C 7.9 (H) 10/17/2016   MICROALBUR 1.3 03/28/2016      Assessment & Plan:

## 2016-10-17 NOTE — Patient Instructions (Addendum)
Please take all new medication as prescribed - the oxybutinin 5 mg twice per day  Please continue all other medications as before, and refills have been done if requested.  Please have the pharmacy call with any other refills you may need.  Please continue your efforts at being more active, low cholesterol diet, and weight control.  Please keep your appointments with your specialists as you may have planned  OK to have your lab work done that was missed recently.  You will be contacted by phone if any changes need to be made immediately.  Otherwise, you will receive a letter about your results with an explanation, but please check with MyChart first.  Please remember to sign up for MyChart if you have not done so, as this will be important to you in the future with finding out test results, communicating by private email, and scheduling acute appointments online when needed.  Please return in 6 months, or sooner if needed

## 2016-10-18 DIAGNOSIS — H6123 Impacted cerumen, bilateral: Secondary | ICD-10-CM | POA: Diagnosis not present

## 2016-10-20 DIAGNOSIS — R350 Frequency of micturition: Secondary | ICD-10-CM | POA: Insufficient documentation

## 2016-10-20 NOTE — Assessment & Plan Note (Signed)
.  stable overall by history and exam, recent data reviewed with pt, and pt to continue medical treatment as before,  to f/u any worsening symptoms or concerns BP Readings from Last 3 Encounters:  10/17/16 134/82  09/29/16 118/76  08/08/16 130/64

## 2016-10-20 NOTE — Assessment & Plan Note (Signed)
Exam c/w msk strain, is benign, ok for tylenol prn,  to f/u any worsening symptoms or concerns

## 2016-10-20 NOTE — Assessment & Plan Note (Signed)
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

## 2016-10-30 ENCOUNTER — Ambulatory Visit (INDEPENDENT_AMBULATORY_CARE_PROVIDER_SITE_OTHER): Payer: Medicare Other | Admitting: *Deleted

## 2016-10-30 DIAGNOSIS — Z7901 Long term (current) use of anticoagulants: Secondary | ICD-10-CM | POA: Diagnosis not present

## 2016-10-30 DIAGNOSIS — I4891 Unspecified atrial fibrillation: Secondary | ICD-10-CM

## 2016-10-30 DIAGNOSIS — I483 Typical atrial flutter: Secondary | ICD-10-CM

## 2016-10-30 LAB — POCT INR: INR: 3.3

## 2016-11-03 ENCOUNTER — Ambulatory Visit (INDEPENDENT_AMBULATORY_CARE_PROVIDER_SITE_OTHER): Payer: Medicare Other | Admitting: Internal Medicine

## 2016-11-03 ENCOUNTER — Encounter: Payer: Self-pay | Admitting: Internal Medicine

## 2016-11-03 VITALS — BP 116/72 | HR 86 | Temp 97.8°F | Ht 71.0 in | Wt 220.0 lb

## 2016-11-03 DIAGNOSIS — G8929 Other chronic pain: Secondary | ICD-10-CM

## 2016-11-03 DIAGNOSIS — M25361 Other instability, right knee: Secondary | ICD-10-CM | POA: Diagnosis not present

## 2016-11-03 DIAGNOSIS — M545 Low back pain: Secondary | ICD-10-CM

## 2016-11-03 DIAGNOSIS — I10 Essential (primary) hypertension: Secondary | ICD-10-CM

## 2016-11-03 MED ORDER — HYDROCODONE-ACETAMINOPHEN 10-325 MG PO TABS
1.0000 | ORAL_TABLET | Freq: Every day | ORAL | 0 refills | Status: DC | PRN
Start: 2016-11-03 — End: 2016-12-26

## 2016-11-03 NOTE — Assessment & Plan Note (Signed)
Stable, for hydrocodone limited refill,  to f/u any worsening symptoms or concerns

## 2016-11-03 NOTE — Progress Notes (Signed)
Subjective:    Patient ID: [name], male    DOB: 24-Nov-1945, 71 y.o.   MRN: [id number]  HPI  Here with 2 days onset right knee instability without pain, swelling or giveaways or falls.  Knee just tends to flare out laterally with ambulation, walks with cane.  Tends to happen more to walking about 100 ft. Nothing else makes worse, and he just "push it back in" when the knee starts to buckle laterally.  No fever.  Under quite a bit of stress at this time, as wife was hospd for serious illness last wk, and sister just entered hospice care.  Pt denies chest pain, increased sob or doe, wheezing, orthopnea, PND, increased LE swelling, palpitations, dizziness or syncope.  Pt denies new neurological symptoms such as new headache, or facial or extremity weakness or numbness  Pt denies polydipsia, polyuria.  Asks for refill hydrocodone of which uses sparingly for back and other pain, usually needs about #30 for 3 mo Past Medical History:  Diagnosis Date  . Aortic root dilatation ([place]) 02/02/2011  . Arthritis    "lower back; going back down both my sciatic nerves"  . 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 BENIGN PROSTATIC HYPERTROPHY 08/29/2006   takes Flomax daily  . 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 Cataracts, bilateral    immature  . CHF (congestive heart failure) (HCC)    takes Lasix daily  . Chronic back pain    HNP   . CORONARY ARTERY DISEASE 08/29/2006   takes Coumadin daily  . DEPRESSION 08/29/2006  . DIABETES MELLITUS, TYPE II 08/29/2006   takes Metformin,Januvia,and Glipizide  daily  . DIASTOLIC HEART FAILURE, CHRONIC 06/09/2008  . GOUT 04/22/2007   takes Allopurinol daily  . History of migraine    18yr ago  . HYPERLIPIDEMIA 08/29/2006   takes Atorvastatin daily  . HYPERTENSION 08/29/2006   takes Lisinopril daily  . INSOMNIA-SLEEP DISORDER-UNSPEC 10/23/2007  . Left lumbar radiculopathy 05/30/2010  . LUMBAR RADICULOPATHY, RIGHT 06/10/2007  . Muscle spasm    takes  Zanaflex daily  . [place] 12/[place]   "I've had several MIs"  . 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 PACEMAKER, PERMANENT 03/04/2008   pt denies this date  . Peripheral neuropathy    takes Gabapentin daily  . Presence of permanent cardiac pacemaker   . Shortness of breath dyspnea    "all my life" with exertion  . Sleep apnea    "if I lay flat I quit breathing; HOB up I'm fine"   Past Surgical History:  Procedure Laterality Date  . COLONOSCOPY    . CORONARY ANGIOPLASTY WITH STENT PLACEMENT  12/27/10   "I've had a total of 9 cardiac stents put in"  . CORONARY ARTERY BYPASS GRAFT  1992   CABG X 2  . ESOPHAGOGASTRODUODENOSCOPY N/A 12/01/2013   Procedure: ESOPHAGOGASTRODUODENOSCOPY (EGD);  Surgeon: DLafayette Dragon MD;  [facility];  Service: Endoscopy;  Laterality: N/A;  . INSERT / REPLACE / REMOVE PACEMAKER  ~ 2004   initial pacemaker placement  . INSERT / REPLACE / REMOVE PACEMAKER  10/2009   generator change  . LUMBAR LAMINECTOMY/DECOMPRESSION MICRODISCECTOMY Right 03/23/2014   Procedure: LAMINECTOMY AND FORAMINOTOMY RIGHT LUMBAR THREE-FOUR,LUMBAR FOUR-FIVE, LUMBAR FIVE-SACRAL ONE;  Surgeon: HCharlie Pitter MD;  [facility];  Service: Neurosurgery;  Laterality: Right;  right  . LUMBAR LAMINECTOMY/DECOMPRESSION MICRODISCECTOMY Left 12/01/2014   Procedure: Left Lumbar Three-Four, Lumbar Four-Five Laminectomy and Foraminotomy;  Surgeon: HEarnie Larsson  MD;  Location: [place] [facility];  Service: Neurosurgery;  Laterality: Left;  . s/p left arm surgury after work accident  1991   "2000# steel fell on it"  . s/p right hand surgury for foreign object  1970's   "piece of wood went in my hand; had to get that out"    reports that he quit smoking about 43 years ago. His smoking use included Cigarettes. He has a 27.00 pack-year smoking history. He has quit using smokeless tobacco. He reports that he does not drink alcohol or use drugs. family history includes  Coronary artery disease (age of onset: 11) in his other; Diabetes in his mother, other, and sister; Heart disease in his sister and sister; Lung cancer in his sister. Allergies  Allergen Reactions  . Crestor [Rosuvastatin Calcium] Other (See Comments)    Urination of blood  . Latex Rash and Other (See Comments)    Gloves cause welts on hands!!   Current Outpatient Prescriptions on File Prior to Visit  Medication Sig Dispense Refill  . allopurinol (ZYLOPRIM) 300 MG tablet take 1 tablet by mouth once daily (Patient taking differently: Take 300 mg by mouth daily. ) 90 tablet 3  . atorvastatin (LIPITOR) 80 MG tablet TAKE 1 TABLET BY MOUTH  DAILY 90 tablet 2  . Blood Glucose Monitoring Suppl (ONE TOUCH ULTRA 2) w/Device KIT Use as directed 1 each 3  . carvedilol (COREG) 25 MG tablet TAKE 1 TABLET BY MOUTH TWO  TIMES DAILY 180 tablet 2  . Cholecalciferol (VITAMIN D) 1000 UNITS capsule Take 1,000 Units by mouth 2 (two) times daily. Reported on 02/15/2015    . donepezil (ARICEPT) 5 MG tablet TAKE 1 TABLET BY MOUTH AT  BEDTIME 90 tablet 1  . fluocinonide cream (LIDEX) 8.78 % Apply 1 application topically 2 (two) times daily. 30 g 2  . fluticasone (FLONASE) 50 MCG/ACT nasal spray USE 2 SPRAYS IN EACH  NOSTRIL EVERY DAY 48 g 2  . furosemide (LASIX) 40 MG tablet TAKE 1 TABLET BY MOUTH  DAILY 90 tablet 2  . furosemide (LASIX) 80 MG tablet Take 1 tablet (80 mg total) by mouth 2 (two) times daily. 60 tablet 5  . gabapentin (NEURONTIN) 300 MG capsule TAKE 2 CAPSULES BY MOUTH 3  TIMES DAILY 540 capsule 2  . glipiZIDE (GLUCOTROL XL) 10 MG 24 hr tablet TAKE 1 TABLET BY MOUTH  DAILY WITH BREAKFAST 90 tablet 2  . indomethacin (INDOCIN) 50 MG capsule Take 1 capsule (50 mg total) by mouth 3 (three) times daily as needed. 270 capsule 3  . lisinopril (PRINIVIL,ZESTRIL) 2.5 MG tablet TAKE 1 TABLET BY MOUTH  DAILY 90 tablet 2  . metFORMIN (GLUCOPHAGE) 500 MG tablet TAKE 2 TABLETS BY MOUTH TWO TIMES DAILY 360 tablet 2    . Multiple Vitamin (MULTIVITAMIN WITH MINERALS) TABS tablet Take 1 tablet by mouth every morning.    . ONE TOUCH ULTRA TEST test strip CHECK BLOOD SUGAR TWO TIMES DAILY 100 each 2  . ONETOUCH DELICA LANCETS 67E MISC USE AS DIRECTED THREE TIMES DAILY TO CHECK BLOOD SUGAR 300 each 2  . oxybutynin (DITROPAN XL) 10 MG 24 hr tablet Take 1 tablet (10 mg total) by mouth at bedtime. 90 tablet 3  . potassium chloride SA (K-DUR,KLOR-CON) 20 MEQ tablet TAKE 1 TABLET BY MOUTH  EVERY EVENING 90 tablet 2  . sitaGLIPtin (JANUVIA) 100 MG tablet Take 1 tablet (100 mg total) by mouth daily. 90 tablet 3  . tamsulosin (FLOMAX) 0.4  MG CAPS capsule TAKE 1 CAPSULE BY MOUTH  EVERY 12 HOURS 180 capsule 2  . tiZANidine (ZANAFLEX) 4 MG tablet TAKE 1 TABLET BY MOUTH  EVERY 6 HOURS AS NEEDED FOR MUSCLE SPASMS. 180 tablet 0  . traMADol (ULTRAM) 50 MG tablet Take 1 tablet (50 mg total) by mouth every 8 (eight) hours as needed. 60 tablet 0  . warfarin (COUMADIN) 5 MG tablet TAKE 1 TABLET BY MOUTH DAILY OR AS DIRECTED BY COUMADIN CLINIC 90 tablet 1   No current facility-administered medications on file prior to visit.    Review of Systems  Constitutional: Negative for other unusual diaphoresis or sweats HENT: Negative for ear discharge or swelling Eyes: Negative for other worsening visual disturbances Respiratory: Negative for stridor or other swelling  Gastrointestinal: Negative for worsening distension or other blood Genitourinary: Negative for retention or other urinary change Musculoskeletal: Negative for other MSK pain or swelling Skin: Negative for color change or other new lesions Neurological: Negative for worsening tremors and other numbness  Psychiatric/Behavioral: Negative for worsening agitation or other fatigue All other system neg per pt    Objective:   Physical Exam BP 116/72   Pulse 86   Temp 97.8 F (36.6 C) (Oral)   Ht '5\' 11"'  (1.803 m)   Wt 220 lb (99.8 kg)   SpO2 97%   BMI 30.68 kg/m  \VS  noted, not ill appearing Constitutional: Pt appears in NAD HENT: Head: NCAT.  Right Ear: External ear normal.  Left Ear: External ear normal.  Eyes: . Pupils are equal, round, and reactive to light. Conjunctivae and EOM are normal Nose: without d/c or deformity Neck: Neck supple. Gross normal ROM Cardiovascular: Normal rate and regular rhythm.   Pulmonary/Chest: Effort normal and breath sounds without rales or wheezing.  Right knee NT, no effusion, has slight reduced ROM to full extension, but no overt instability noted, lockmans neg Neurological: Pt is alert. At baseline orientation, motor grossly intact Skin: Skin is warm. No rashes, other new lesions, no LE edema Psychiatric: Pt behavior is normal without agitation  No other exam findings Lab Results  Component Value Date   WBC 4.8 03/28/2016   HGB 11.3 (L) 03/28/2016   HCT 34.7 (L) 03/28/2016   PLT 141.0 (L) 03/28/2016   GLUCOSE 141 (H) 10/17/2016   CHOL 101 10/17/2016   TRIG 60.0 10/17/2016   HDL 46.80 10/17/2016   LDLCALC 42 10/17/2016   ALT 37 10/17/2016   AST 30 10/17/2016   NA 139 10/17/2016   K 4.6 10/17/2016   CL 103 10/17/2016   CREATININE 0.87 10/17/2016   BUN 18 10/17/2016   CO2 29 10/17/2016   TSH 1.23 03/28/2016   PSA 3.06 06/15/2015   INR 3.3 10/30/2016   HGBA1C 7.9 (H) 10/17/2016   MICROALBUR 1.3 03/28/2016          Assessment & Plan:

## 2016-11-03 NOTE — Assessment & Plan Note (Signed)
stable overall by history and exam, recent data reviewed with pt, and pt to continue medical treatment as before,  to f/u any worsening symptoms or concerns BP Readings from Last 3 Encounters:  11/03/16 116/72  10/17/16 134/82  09/29/16 118/76

## 2016-11-03 NOTE — Assessment & Plan Note (Signed)
Suspect ligamentous injury or insufficiency, is currently pain free and no effusion with adequate only slightly limited ROM and no overt instability found on exam, will hold on imaging as will not help for soft tissue evaluation, I asked pt to f/u with sports medicine and he agrees, also for right knee brace, cane use as he does

## 2016-11-03 NOTE — Patient Instructions (Signed)
Please be sure to walk with the cane, and knee brace for the right knee  You may want to avoid longer walking without furniture to catch  Please see Dr Tamala Julian as you suggested for sports medicine  Please continue all other medications as before, and refills have been done if requested - the hydrocodone  Please have the pharmacy call with any other refills you may need.  Please continue your efforts at being more active, low cholesterol diet, and weight control.  Please keep your appointments with your specialists as you may have planned

## 2016-11-07 ENCOUNTER — Other Ambulatory Visit: Payer: Self-pay

## 2016-11-07 ENCOUNTER — Ambulatory Visit: Payer: Self-pay

## 2016-11-07 NOTE — Patient Outreach (Signed)
[place] [facility]) Care Management  11/07/2016  LEWAYNE PAULEY Nov 05, 1945 [id number]   Outreach attempt # 1  to patient. No answer. RN HEALTH COACH left HIPAA compliant voicemail message along with contact info.   Plan: RN HEALTH COACH will make outreach attempt to patient within three business days.    [name] RN, BSN, [place] Direct Dial:  [phone] Fax: [phone]

## 2016-11-10 ENCOUNTER — Other Ambulatory Visit: Payer: Self-pay

## 2016-11-10 NOTE — Patient Outreach (Signed)
[facility] [facility]) Care Management  11/10/2016  [name] Jan 23, 1946 [id number]   Outreach attempt # 2 to patient. No answer. RN HEALTH COACH unable to leave voicemail message and contact info.    Plan: RN HEALTH COACH will make outreach attempt to patient within three business days.    [name] RN, BSN, [place] Direct Dial: [phone]

## 2016-11-14 ENCOUNTER — Other Ambulatory Visit: Payer: Self-pay

## 2016-11-14 NOTE — Patient Outreach (Signed)
[place] [facility]) Care Management  11/14/2016  [name] 1945-12-23 [id number]   Outreach attempt # 3to patient. No answer. RN HEALTH COACH unable to leave voicemail message and contact info.    Plan: RN HEALTH COACH will make outreach attempt to patient in the month of November.    [name] RN, BSN, [place] Direct Dial:[phone]

## 2016-11-20 ENCOUNTER — Ambulatory Visit (INDEPENDENT_AMBULATORY_CARE_PROVIDER_SITE_OTHER): Payer: Medicare Other | Admitting: *Deleted

## 2016-11-20 DIAGNOSIS — I483 Typical atrial flutter: Secondary | ICD-10-CM | POA: Diagnosis not present

## 2016-11-20 DIAGNOSIS — Z7901 Long term (current) use of anticoagulants: Secondary | ICD-10-CM | POA: Diagnosis not present

## 2016-11-20 DIAGNOSIS — I4891 Unspecified atrial fibrillation: Secondary | ICD-10-CM

## 2016-11-20 LAB — POCT INR: INR: 3.1

## 2016-11-23 ENCOUNTER — Other Ambulatory Visit: Payer: Self-pay | Admitting: Cardiology

## 2016-11-23 ENCOUNTER — Other Ambulatory Visit: Payer: Self-pay | Admitting: Internal Medicine

## 2016-11-24 ENCOUNTER — Ambulatory Visit (INDEPENDENT_AMBULATORY_CARE_PROVIDER_SITE_OTHER): Payer: Medicare Other | Admitting: Podiatry

## 2016-11-24 ENCOUNTER — Encounter: Payer: Self-pay | Admitting: Podiatry

## 2016-11-24 DIAGNOSIS — E1142 Type 2 diabetes mellitus with diabetic polyneuropathy: Secondary | ICD-10-CM

## 2016-11-24 DIAGNOSIS — M79676 Pain in unspecified toe(s): Secondary | ICD-10-CM

## 2016-11-24 DIAGNOSIS — B351 Tinea unguium: Secondary | ICD-10-CM

## 2016-11-24 DIAGNOSIS — D689 Coagulation defect, unspecified: Secondary | ICD-10-CM

## 2016-11-24 NOTE — Progress Notes (Signed)
Complaint:  Visit Type: Patient returns to my office for continued preventative foot care services. Complaint: Patient states" my nails have grown long and thick and become painful to walk and wear shoes" Patient has been diagnosed with DM with no foot complications. The patient presents for preventative foot care services. No changes to ROS.  Patient is on coumadin.  Podiatric Exam: Vascular: dorsalis pedis and posterior tibial pulses are palpable bilateral. Capillary return is immediate. Temperature gradient is WNL. Skin turgor WNL  Sensorium: Normal Semmes Weinstein monofilament test. Normal tactile sensation bilaterally. Nail Exam: Pt has thick disfigured discolored nails with subungual debris noted bilateral entire nail hallux through fifth toenails Ulcer Exam: There is no evidence of ulcer or pre-ulcerative changes or infection. Orthopedic Exam: Muscle tone and strength are WNL. No limitations in general ROM. No crepitus or effusions noted. Foot type and digits show no abnormalities. Bony prominences are unremarkable. Skin: No Porokeratosis. No infection or ulcers  Diagnosis:  Onychomycosis, , Pain in right toe, pain in left toes  Treatment & Plan Procedures and Treatment: Consent by patient was obtained for treatment procedures.   Debridement of mycotic and hypertrophic toenails, 1 through 5 bilateral and clearing of subungual debris. No ulceration, no infection noted.  Return Visit-Office Procedure: Patient instructed to return to the office for a follow up visit 3 months for continued evaluation and treatment.    Gardiner Barefoot DPM

## 2016-11-29 ENCOUNTER — Other Ambulatory Visit: Payer: Self-pay | Admitting: Internal Medicine

## 2016-12-11 ENCOUNTER — Ambulatory Visit (INDEPENDENT_AMBULATORY_CARE_PROVIDER_SITE_OTHER): Payer: Medicare Other | Admitting: *Deleted

## 2016-12-11 DIAGNOSIS — Z7901 Long term (current) use of anticoagulants: Secondary | ICD-10-CM

## 2016-12-11 DIAGNOSIS — I4891 Unspecified atrial fibrillation: Secondary | ICD-10-CM | POA: Diagnosis not present

## 2016-12-11 DIAGNOSIS — I483 Typical atrial flutter: Secondary | ICD-10-CM | POA: Diagnosis not present

## 2016-12-11 LAB — POCT INR: INR: 2.7

## 2016-12-15 ENCOUNTER — Ambulatory Visit: Payer: Self-pay

## 2016-12-18 ENCOUNTER — Encounter: Payer: Self-pay | Admitting: Internal Medicine

## 2016-12-18 ENCOUNTER — Encounter: Payer: Medicare Other | Admitting: Internal Medicine

## 2016-12-20 ENCOUNTER — Other Ambulatory Visit: Payer: Self-pay

## 2016-12-20 NOTE — Patient Outreach (Signed)
[place] [facility]) Care Management  12/20/2016  [name] February 17, 1945 [id number]    Telephone call to patient for monthly outreach. Wife answered the phone and stated that the patient was not at home.  Left a HIPAA compliant massage.  Plan: RN health Coach will contact the patient in the month of December.  [name] RN, BSN, [place] Direct Dial:  [phone] Fax: [phone]

## 2016-12-26 ENCOUNTER — Ambulatory Visit (INDEPENDENT_AMBULATORY_CARE_PROVIDER_SITE_OTHER): Payer: Medicare Other | Admitting: Internal Medicine

## 2016-12-26 ENCOUNTER — Encounter: Payer: Self-pay | Admitting: Internal Medicine

## 2016-12-26 DIAGNOSIS — M545 Low back pain: Secondary | ICD-10-CM

## 2016-12-26 DIAGNOSIS — M25551 Pain in right hip: Secondary | ICD-10-CM | POA: Insufficient documentation

## 2016-12-26 DIAGNOSIS — G8929 Other chronic pain: Secondary | ICD-10-CM

## 2016-12-26 DIAGNOSIS — I10 Essential (primary) hypertension: Secondary | ICD-10-CM

## 2016-12-26 MED ORDER — HYDROCODONE-ACETAMINOPHEN 10-325 MG PO TABS: 1.0000 | ORAL_TABLET | Freq: Every day | ORAL | 0 refills | Status: DC | PRN

## 2016-12-26 MED ORDER — HYDROCODONE-ACETAMINOPHEN 10-325 MG PO TABS
1.0000 | ORAL_TABLET | Freq: Every day | ORAL | 0 refills | Status: DC | PRN
Start: 2016-12-26 — End: 2016-12-26

## 2016-12-26 NOTE — Assessment & Plan Note (Signed)
stable overall by history and exam, recent data reviewed with pt, and pt to continue medical treatment as before,  to f/u any worsening symptoms or concerns BP Readings from Last 3 Encounters:  12/26/16 136/84  11/03/16 116/72  10/17/16 134/82

## 2016-12-26 NOTE — Assessment & Plan Note (Signed)
C/with bursitis of right hip, for pain control, an pt to f/u with sports medicine, likely needs cortisone

## 2016-12-26 NOTE — Assessment & Plan Note (Signed)
Stable, for pain control,  to f/u any worsening symptoms or concerns

## 2016-12-26 NOTE — Progress Notes (Signed)
Subjective:    Patient ID: [name], male    DOB: January 14, 1946, 71 y.o.   MRN: [id number]  HPI  Here to f/u; overall doing ok,  Pt denies chest pain, increasing sob or doe, wheezing, orthopnea, PND, increased LE swelling, palpitations, dizziness or syncope.  Pt denies new neurological symptoms such as new headache, or facial or extremity weakness or numbness.  Pt denies polydipsia, polyuria, or low sugar episode.  Pt states overall good compliance with meds, mostly trying to follow appropriate diet, with wt overall stable,  but little exercise however. Pt continues to have recurring LBP without change in severity, bowel or bladder change, fever, wt loss,  worsening LE pain/numbness/weakness, gait change or falls.  Does have right hip to knee pain laterally x 6 mo, now moderate, dull but sharp to move, particularly worse yesterday, could hardly walk, worse to lie on right side, and similar to prior left hip bursitis tx with sports medicine. Past Medical History:  Diagnosis Date  . Aortic root dilatation ([place]) 02/02/2011  . Arthritis    "lower back; going back down both my sciatic nerves"  . 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 BENIGN PROSTATIC HYPERTROPHY 08/29/2006   takes Flomax daily  . 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 Cataracts, bilateral    immature  . CHF (congestive heart failure) (HCC)    takes Lasix daily  . Chronic back pain    HNP   . CORONARY ARTERY DISEASE 08/29/2006   takes Coumadin daily  . DEPRESSION 08/29/2006  . DIABETES MELLITUS, TYPE II 08/29/2006   takes Metformin,Januvia,and Glipizide  daily  . DIASTOLIC HEART FAILURE, CHRONIC 06/09/2008  . GOUT 04/22/2007   takes Allopurinol daily  . History of migraine    86yr ago  . HYPERLIPIDEMIA 08/29/2006   takes Atorvastatin daily  . HYPERTENSION 08/29/2006   takes Lisinopril daily  . INSOMNIA-SLEEP DISORDER-UNSPEC 10/23/2007  . Left lumbar radiculopathy 05/30/2010  . LUMBAR RADICULOPATHY, RIGHT  06/10/2007  . Muscle spasm    takes Zanaflex daily  . [place]   "I've had several MIs"  . 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 PACEMAKER, PERMANENT 03/04/2008   pt denies this date  . Peripheral neuropathy    takes Gabapentin daily  . Presence of permanent cardiac pacemaker   . Shortness of breath dyspnea    "all my life" with exertion  . Sleep apnea    "if I lay flat I quit breathing; HOB up I'm fine"   Past Surgical History:  Procedure Laterality Date  . COLONOSCOPY    . CORONARY ANGIOPLASTY WITH STENT PLACEMENT  12/27/10   "I've had a total of 9 cardiac stents put in"  . CORONARY ARTERY BYPASS GRAFT  1992   CABG X 2  . ESOPHAGOGASTRODUODENOSCOPY N/A 12/01/2013   Procedure: ESOPHAGOGASTRODUODENOSCOPY (EGD);  Surgeon: DLafayette Dragon MD;  [facility];  Service: Endoscopy;  Laterality: N/A;  . INSERT / REPLACE / REMOVE PACEMAKER  ~ 2004   initial pacemaker placement  . INSERT / REPLACE / REMOVE PACEMAKER  10/2009   generator change  . LUMBAR LAMINECTOMY/DECOMPRESSION MICRODISCECTOMY Right 03/23/2014   Procedure: LAMINECTOMY AND FORAMINOTOMY RIGHT LUMBAR THREE-FOUR,LUMBAR FOUR-FIVE, LUMBAR FIVE-SACRAL ONE;  Surgeon: HCharlie Pitter MD;  [facility];  Service: Neurosurgery;  Laterality: Right;  right  . LUMBAR LAMINECTOMY/DECOMPRESSION MICRODISCECTOMY Left 12/01/2014   Procedure: Left Lumbar Three-Four, Lumbar Four-Five Laminectomy and Foraminotomy;  Surgeon: HEarnie Larsson MD;  [facility];  Service: Neurosurgery;  Laterality: Left;  . s/p left arm surgury after work accident  1991   "2000# steel fell on it"  . s/p right hand surgury for foreign object  1970's   "piece of wood went in my hand; had to get that out"    reports that he quit smoking about 43 years ago. His smoking use included cigarettes. He has a 27.00 pack-year smoking history. He has quit using smokeless tobacco. He reports that he does not drink alcohol or  use drugs. family history includes Coronary artery disease (age of onset: 57) in his other; Diabetes in his mother, other, and sister; Heart disease in his sister and sister; Lung cancer in his sister. Allergies  Allergen Reactions  . Crestor [Rosuvastatin Calcium] Other (See Comments)    Urination of blood  . Latex Rash and Other (See Comments)    Gloves cause welts on hands!!   Current Outpatient Medications on File Prior to Visit  Medication Sig Dispense Refill  . allopurinol (ZYLOPRIM) 300 MG tablet take 1 tablet by mouth once daily (Patient taking differently: Take 300 mg by mouth daily. ) 90 tablet 3  . atorvastatin (LIPITOR) 80 MG tablet TAKE 1 TABLET BY MOUTH  DAILY 90 tablet 0  . Blood Glucose Monitoring Suppl (ONE TOUCH ULTRA 2) w/Device KIT Use as directed 1 each 3  . carvedilol (COREG) 25 MG tablet TAKE 1 TABLET BY MOUTH TWO  TIMES DAILY 180 tablet 0  . Cholecalciferol (VITAMIN D) 1000 UNITS capsule Take 1,000 Units by mouth 2 (two) times daily. Reported on 02/15/2015    . donepezil (ARICEPT) 5 MG tablet TAKE 1 TABLET BY MOUTH AT  BEDTIME 90 tablet 1  . fluocinonide cream (LIDEX) 5.17 % Apply 1 application topically 2 (two) times daily. 30 g 2  . fluticasone (FLONASE) 50 MCG/ACT nasal spray USE 2 SPRAYS IN EACH  NOSTRIL EVERY DAY 48 g 0  . furosemide (LASIX) 40 MG tablet TAKE 1 TABLET BY MOUTH  DAILY 90 tablet 2  . furosemide (LASIX) 80 MG tablet Take 1 tablet (80 mg total) by mouth 2 (two) times daily. 60 tablet 5  . gabapentin (NEURONTIN) 300 MG capsule TAKE 2 CAPSULES BY MOUTH 3  TIMES DAILY 540 capsule 0  . glipiZIDE (GLUCOTROL XL) 10 MG 24 hr tablet TAKE 1 TABLET BY MOUTH  DAILY WITH BREAKFAST 90 tablet 0  . indomethacin (INDOCIN) 50 MG capsule Take 1 capsule (50 mg total) by mouth 3 (three) times daily as needed. 270 capsule 3  . lisinopril (PRINIVIL,ZESTRIL) 2.5 MG tablet TAKE 1 TABLET BY MOUTH  DAILY 90 tablet 0  . metFORMIN (GLUCOPHAGE) 500 MG tablet TAKE 2 TABLETS BY  MOUTH TWO TIMES DAILY 360 tablet 0  . Multiple Vitamin (MULTIVITAMIN WITH MINERALS) TABS tablet Take 1 tablet by mouth every morning.    . ONE TOUCH ULTRA TEST test strip CHECK BLOOD SUGAR TWO TIMES DAILY 200 each 1  . ONETOUCH DELICA LANCETS 61Y MISC USE AS DIRECTED THREE TIMES DAILY TO CHECK BLOOD SUGAR 300 each 0  . oxybutynin (DITROPAN XL) 10 MG 24 hr tablet Take 1 tablet (10 mg total) by mouth at bedtime. 90 tablet 3  . potassium chloride SA (K-DUR,KLOR-CON) 20 MEQ tablet TAKE 1 TABLET BY MOUTH  EVERY EVENING 90 tablet 0  . sitaGLIPtin (JANUVIA) 100 MG tablet Take 1 tablet (100 mg total) by mouth daily. 90 tablet 3  . tamsulosin (FLOMAX) 0.4 MG CAPS capsule TAKE 1 CAPSULE  BY MOUTH  EVERY 12 HOURS 180 capsule 0  . tiZANidine (ZANAFLEX) 4 MG tablet TAKE 1 TABLET BY MOUTH  EVERY 6 HOURS AS NEEDED FOR MUSCLE SPASMS. 180 tablet 0  . traMADol (ULTRAM) 50 MG tablet Take 1 tablet (50 mg total) by mouth every 8 (eight) hours as needed. 60 tablet 0  . warfarin (COUMADIN) 5 MG tablet TAKE 1 TABLET BY MOUTH DAILY OR AS DIRECTED BY COUMADIN CLINIC 90 tablet 1   No current facility-administered medications on file prior to visit.    Review of Systems  Constitutional: Negative for other unusual diaphoresis or sweats HENT: Negative for ear discharge or swelling Eyes: Negative for other worsening visual disturbances Respiratory: Negative for stridor or other swelling  Gastrointestinal: Negative for worsening distension or other blood Genitourinary: Negative for retention or other urinary change Musculoskeletal: Negative for other MSK pain or swelling Skin: Negative for color change or other new lesions Neurological: Negative for worsening tremors and other numbness  Psychiatric/Behavioral: Negative for worsening agitation or other fatigue All other system neg per pt    Objective:   Physical Exam BP 136/84   Pulse 83   Temp 97.6 F (36.4 C) (Oral)   Ht _0  (1.803 m)   Wt 219 lb (99.3 kg)    SpO2 98%   BMI 30.54 kg/m  VS noted,  Constitutional: Pt appears in NAD HENT: Head: NCAT.  Right Ear: External ear normal.  Left Ear: External ear normal.  Eyes: . Pupils are equal, round, and reactive to light. Conjunctivae and EOM are normal Nose: without d/c or deformity Neck: Neck supple. Gross normal ROM Cardiovascular: Normal rate and regular rhythm.   Pulmonary/Chest: Effort normal and breath sounds without rales or wheezing.  Abd:  Soft, NT, ND, + BS, no organomegaly + tender over the right greater trochanter Neurological: Pt is alert. At baseline orientation, motor grossly intact Skin: Skin is warm. No rashes, other new lesions, no LE edema Psychiatric: Pt behavior is normal without agitation  No other exam findings    Assessment & Plan:

## 2016-12-26 NOTE — Patient Instructions (Signed)
Please continue all other medications as before, and refills have been done if requested - the hydrocodone  Please have the pharmacy call with any other refills you may need.  Please continue your efforts at being more active, low cholesterol diet, and weight control.  Please keep your appointments with your specialists as you may have planned  Please make appt with Dr Tamala Julian for the right hip bursitis

## 2017-01-01 ENCOUNTER — Encounter: Payer: Self-pay | Admitting: Family Medicine

## 2017-01-01 ENCOUNTER — Ambulatory Visit (INDEPENDENT_AMBULATORY_CARE_PROVIDER_SITE_OTHER): Payer: Medicare Other | Admitting: Family Medicine

## 2017-01-01 DIAGNOSIS — M7061 Trochanteric bursitis, right hip: Secondary | ICD-10-CM | POA: Diagnosis not present

## 2017-01-01 NOTE — Progress Notes (Signed)
[name] [address], [place] [address] Phone: [phone] Subjective:     CC: Right hip pain  XLK:[id number]  [name] is a 71 y.o. male coming in with complaint of right hip. Looking to get an injection.  Patient states that the pain is on the lateral aspect of the hip.  Patient has had some tingling this previously he states.  Has been quite some time since we have seen patient.  He did have more of a greater trochanteric bursitis on the left side back in August 2016.  Patient states it seems to stay localized to the lateral aspect of the right hip.  Worse after sitting for long amount of time.  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      Past Medical History:  Diagnosis Date  . Aortic root dilatation ([place]) 02/02/2011  . Arthritis    "lower back; going back down both my sciatic nerves"  . 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 BENIGN PROSTATIC HYPERTROPHY 08/29/2006   takes Flomax daily  . 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 Cataracts, bilateral    immature  . CHF (congestive heart failure) (HCC)    takes Lasix daily  . Chronic back pain    HNP   . CORONARY ARTERY DISEASE 08/29/2006   takes Coumadin daily  . DEPRESSION 08/29/2006  . DIABETES MELLITUS, TYPE II 08/29/2006   takes Metformin,Januvia,and Glipizide  daily  . DIASTOLIC HEART FAILURE, CHRONIC 06/09/2008  . GOUT 04/22/2007   takes Allopurinol daily  . History of migraine    52yrs ago  . HYPERLIPIDEMIA 08/29/2006   takes Atorvastatin daily  . HYPERTENSION 08/29/2006   takes Lisinopril daily  . INSOMNIA-SLEEP DISORDER-UNSPEC 10/23/2007  . Left lumbar radiculopathy 05/30/2010  . LUMBAR RADICULOPATHY, RIGHT 06/10/2007  . Muscle spasm    takes Zanaflex daily  . Myocardial infarction ([place]) 12/27/10   "I've had several MIs"  . 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 PACEMAKER, PERMANENT 03/04/2008   pt denies this date  .  Peripheral neuropathy    takes Gabapentin daily  . Presence of permanent cardiac pacemaker   . Shortness of breath dyspnea    "all my life" with exertion  . Sleep apnea    "if I lay flat I quit breathing; HOB up I'm fine"   Past Surgical History:  Procedure Laterality Date  . COLONOSCOPY    . CORONARY ANGIOPLASTY WITH STENT PLACEMENT  12/27/10   "I've had a total of 9 cardiac stents put in"  . CORONARY ARTERY BYPASS GRAFT  1992   CABG X 2  . ESOPHAGOGASTRODUODENOSCOPY N/A 12/01/2013   Procedure: ESOPHAGOGASTRODUODENOSCOPY (EGD);  Surgeon: Lafayette Dragon, MD;  Location: [facility];  Service: Endoscopy;  Laterality: N/A;  . INSERT / REPLACE / REMOVE PACEMAKER  ~ 2004   initial pacemaker placement  . INSERT / REPLACE / REMOVE PACEMAKER  10/2009   generator change  . LUMBAR LAMINECTOMY/DECOMPRESSION MICRODISCECTOMY Right 03/23/2014   Procedure: LAMINECTOMY AND FORAMINOTOMY RIGHT LUMBAR THREE-FOUR,LUMBAR FOUR-FIVE, LUMBAR FIVE-SACRAL ONE;  Surgeon: Charlie Pitter, MD;  Location: [facility];  Service: Neurosurgery;  Laterality: Right;  right  . LUMBAR LAMINECTOMY/DECOMPRESSION MICRODISCECTOMY Left 12/01/2014   Procedure: Left Lumbar Three-Four, Lumbar Four-Five Laminectomy and Foraminotomy;  Surgeon: Earnie Larsson, MD;  Location: [facility];  Service: Neurosurgery;  Laterality: Left;  . s/p left arm surgury after work accident  1991   "2000# steel fell  on it"  . s/p right hand surgury for foreign object  1970's   "piece of wood went in my hand; had to get that out"   Social History   Socioeconomic History  . Marital status: Married    Spouse name: None  . Number of children: 2  . Years of education: None  . Highest education level: None  Social Needs  . Financial resource strain: None  . Food insecurity - worry: None  . Food insecurity - inability: None  . Transportation needs - medical: None  . Transportation needs - non-medical: None  Occupational History  . Occupation: prior  work Designer, industrial/product: UNEMPLOYED  . Occupation: disabled since 2004  Tobacco Use  . Smoking status: Former Smoker    Packs/day: 3.00    Years: 9.00    Pack years: 27.00    Types: Cigarettes    Last attempt to quit: 02/26/1973    Years since quitting: 43.8  . Smokeless tobacco: Former Systems developer  . Tobacco comment: quit smoking 9yrs ago  Substance and Sexual Activity  . Alcohol use: No    Alcohol/week: 0.0 oz  . Drug use: No    Comment: "used pouches of tobacco for a long time; quit those 12/09/1970"  . Sexual activity: Yes  Other Topics Concern  . None  Social History Narrative  . None   Allergies  Allergen Reactions  . Crestor [Rosuvastatin Calcium] Other (See Comments)    Urination of blood  . Latex Rash and Other (See Comments)    Gloves cause welts on hands!!   Family History  Problem Relation Age of Onset  . Diabetes Mother   . Diabetes Sister   . Heart disease Sister        2 sister died with heart disease  . Coronary artery disease Other 10       male, first degree relative  . Diabetes Other        1st degree relative  . Heart disease Sister   . Lung cancer Sister        deceased     Past medical history, social, surgical and family history all reviewed in electronic medical record.  No pertanent information unless stated regarding to the chief complaint.   Review of Systems:Review of systems updated and as accurate as of 01/01/17  No headache, visual changes, nausea, vomiting, diarrhea, constipation, dizziness, abdominal pain, skin rash, fevers, chills, night sweats, weight loss, swollen lymph nodes, , chest pain, shortness of breath, mood changes.  Body aches, joint swelling, muscle aches  Objective  Blood pressure 120/60, pulse 80, height 5\' 11"  (1.803 m), weight 218 lb (98.9 kg), SpO2 96 %. Systems examined below as of 01/01/17   General: No apparent distress alert and oriented x3 mood and affect normal, dressed appropriately.  HEENT: Pupils  equal, extraocular movements intact  Respiratory: Patient's speak in full sentences and does not appear short of breath  Cardiovascular: No lower extremity edema, non tender, no erythema  Skin: Warm dry intact with no signs of infection or rash on extremities or on axial skeleton.  Abdomen: Soft nontender  Neuro: Cranial nerves II through XII are intact, neurovascularly intact in all extremities with 2+ DTRs and 2+ pulses.  Lymph: No lymphadenopathy of posterior or anterior cervical chain or axillae bilaterally.  Gait antalgic gait MSK:  Non tender with full range of motion and good stability and symmetric strength and tone of shoulders, elbows, wrist, , knee and  ankles bilaterally.  Changes of multiple joints Right hip exam shows the patient does have severe tenderness over the greater trochanteric area.  Negative straight leg test.  Mild discomfort in the lower back.  Strength 4+ out of 5 but symmetric.  Mild positive Corky Sox.  Minimal loss of internal range of motion.   Procedure: Real-time Ultrasound Guided Injection of right greater trochanteric bursitis secondary to patient's body habitus Device: GE Logiq Q7 Ultrasound guided injection is preferred based studies that show increased duration, increased effect, greater accuracy, decreased procedural pain, increased response rate, and decreased cost with ultrasound guided versus blind injection.  Verbal informed consent obtained.  Time-out conducted.  Noted no overlying erythema, induration, or other signs of local infection.  Skin prepped in a sterile fashion.  Local anesthesia: Topical Ethyl chloride.  With sterile technique and under real time ultrasound guidance:  Greater trochanteric area was visualized and patient's bursa was noted. A 22-gauge 3 inch needle was inserted and 4 cc of 0.5% Marcaine and 1 cc of Kenalog 40 mg/dL was injected. Pictures taken Completed without difficulty  Pain immediately resolved suggesting accurate placement  of the medication.  Advised to call if fevers/chills, erythema, induration, drainage, or persistent bleeding.  Images permanently stored and available for review in the ultrasound unit.  Impression: Technically successful ultrasound guided injection.    Impression and Recommendations:     This case required medical decision making of moderate complexity.      Note: This dictation was prepared with Dragon dictation along with smaller phrase technology. Any transcriptional errors that result from this process are unintentional.

## 2017-01-01 NOTE — Patient Instructions (Signed)
Good to see you  [name] is your friend. Ice 20 minutes 2 times daily. Usually after activity and before bed. We injected the hip today Over the counter- vitamin D 2000 IU daily  See me again in 4 weeks if not better  Happy holidays!

## 2017-01-01 NOTE — Assessment & Plan Note (Signed)
Patient given injection today and tolerated the procedure well.  Patient is to monitor blood sugars more appropriately.  Patient also to monitor for any lumbar radiculopathy with the severe spinal stenosis the patient has.  We discussed with patient about icing regimen, home exercise, which activities are doing which wants to avoid.  Patient will follow up with me again in 4-6 weeks

## 2017-01-11 ENCOUNTER — Encounter: Payer: Medicare Other | Admitting: Internal Medicine

## 2017-01-12 ENCOUNTER — Ambulatory Visit (INDEPENDENT_AMBULATORY_CARE_PROVIDER_SITE_OTHER): Payer: Medicare Other | Admitting: *Deleted

## 2017-01-12 DIAGNOSIS — I4891 Unspecified atrial fibrillation: Secondary | ICD-10-CM

## 2017-01-12 DIAGNOSIS — Z7901 Long term (current) use of anticoagulants: Secondary | ICD-10-CM

## 2017-01-12 DIAGNOSIS — I483 Typical atrial flutter: Secondary | ICD-10-CM | POA: Diagnosis not present

## 2017-01-12 LAB — POCT INR: INR: 3.4

## 2017-01-15 ENCOUNTER — Encounter: Payer: Self-pay | Admitting: Internal Medicine

## 2017-01-15 ENCOUNTER — Other Ambulatory Visit: Payer: Self-pay

## 2017-01-15 ENCOUNTER — Ambulatory Visit (INDEPENDENT_AMBULATORY_CARE_PROVIDER_SITE_OTHER): Payer: Medicare Other | Admitting: Internal Medicine

## 2017-01-15 DIAGNOSIS — I442 Atrioventricular block, complete: Secondary | ICD-10-CM | POA: Diagnosis not present

## 2017-01-15 NOTE — Patient Outreach (Signed)
[facility]) Care Management  [place]  01/15/2017   [name] 04-25-1945 [id number]  Subjective:  Telephone call placed to the patient for monthly outreach. HIPAA verified.  The patient states that he is doing well.  He does have some pain in his back that he rates at a 6.  The patient states he has medication that he takes for the pain.  The patient is adherent with his medications. He states he gets some exercise by doing things in the home.  He states that he is not following any type of diet just trying to eat in moderation. His blood sugar this morning fasting was 140. RN [facility] congratulated the patient on his progress and discussed with the patient about hydration and eating in moderation. Patient verbalized understanding.  Objective:   Encounter Medications:  Outpatient Encounter Medications as of 01/15/2017  Medication Sig Note  . allopurinol (ZYLOPRIM) 300 MG tablet take 1 tablet by mouth once daily (Patient taking differently: Take 300 mg by mouth daily. )   . atorvastatin (LIPITOR) 80 MG tablet TAKE 1 TABLET BY MOUTH  DAILY   . Blood Glucose Monitoring Suppl (ONE TOUCH ULTRA 2) w/Device KIT Use as directed   . carvedilol (COREG) 25 MG tablet TAKE 1 TABLET BY MOUTH TWO  TIMES DAILY   . Cholecalciferol (VITAMIN D) 1000 UNITS capsule Take 1,000 Units by mouth 2 (two) times daily. Reported on 02/15/2015   . donepezil (ARICEPT) 5 MG tablet TAKE 1 TABLET BY MOUTH AT  BEDTIME   . fluocinonide cream (LIDEX) 9.83 % Apply 1 application topically 2 (two) times daily. 05/18/2016: Use prn   . fluticasone (FLONASE) 50 MCG/ACT nasal spray USE 2 SPRAYS IN EACH  NOSTRIL EVERY DAY   . furosemide (LASIX) 40 MG tablet TAKE 1 TABLET BY MOUTH  DAILY 10/13/2016: Taking 1 tablet BID   . gabapentin (NEURONTIN) 300 MG capsule TAKE 2 CAPSULES BY MOUTH 3  TIMES DAILY   . glipiZIDE (GLUCOTROL XL) 10 MG 24 hr tablet TAKE 1 TABLET BY MOUTH  DAILY WITH BREAKFAST   .  HYDROcodone-acetaminophen (NORCO) 10-325 MG tablet Take 1 tablet by mouth daily as needed.   . indomethacin (INDOCIN) 50 MG capsule Take 1 capsule (50 mg total) by mouth 3 (three) times daily as needed.   Marland Kitchen lisinopril (PRINIVIL,ZESTRIL) 2.5 MG tablet TAKE 1 TABLET BY MOUTH  DAILY   . metFORMIN (GLUCOPHAGE) 500 MG tablet TAKE 2 TABLETS BY MOUTH TWO TIMES DAILY   . Multiple Vitamin (MULTIVITAMIN WITH MINERALS) TABS tablet Take 1 tablet by mouth every morning.   . ONE TOUCH ULTRA TEST test strip CHECK BLOOD SUGAR TWO TIMES DAILY   . ONETOUCH DELICA LANCETS 38S MISC USE AS DIRECTED THREE TIMES DAILY TO CHECK BLOOD SUGAR   . oxybutynin (DITROPAN XL) 10 MG 24 hr tablet Take 1 tablet (10 mg total) by mouth at bedtime.   . potassium chloride SA (K-DUR,KLOR-CON) 20 MEQ tablet TAKE 1 TABLET BY MOUTH  EVERY EVENING   . sitaGLIPtin (JANUVIA) 100 MG tablet Take 1 tablet (100 mg total) by mouth daily.   . tamsulosin (FLOMAX) 0.4 MG CAPS capsule TAKE 1 CAPSULE BY MOUTH  EVERY 12 HOURS   . tiZANidine (ZANAFLEX) 4 MG tablet TAKE 1 TABLET BY MOUTH  EVERY 6 HOURS AS NEEDED FOR MUSCLE SPASMS.   Marland Kitchen traMADol (ULTRAM) 50 MG tablet Take 1 tablet (50 mg total) by mouth every 8 (eight) hours as needed.   . warfarin (  COUMADIN) 5 MG tablet TAKE 1 TABLET BY MOUTH DAILY OR AS DIRECTED BY COUMADIN CLINIC   . furosemide (LASIX) 80 MG tablet Take 1 tablet (80 mg total) by mouth 2 (two) times daily. (Patient not taking: Reported on 01/15/2017) 05/24/2015: .   No facility-administered encounter medications on file as of 01/15/2017.     Functional Status:  In your present state of health, do you have any difficulty performing the following activities: 08/23/2016  Hearing? 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 Y  Difficulty concentrating or making decisions? N  Walking or climbing stairs? N  Dressing or bathing? N  Doing errands, shopping? N  Preparing Food and eating ? N  Using the Toilet? N  In the past six months, have  you accidently leaked urine? N  Do you have problems with loss of bowel control? N  Managing your Medications? N  Managing your Finances? N  Housekeeping or managing your Housekeeping? N  Some recent data might be hidden    Fall/Depression Screening: Fall Risk  01/15/2017 10/13/2016 09/22/2016  Falls in the past year? No No No  Number falls in past yr: - - -  Comment - - -  Injury with Fall? - - -  Risk Factor Category  - - -  Comment - - -  Risk for fall due to : - - -  Follow up - - -   PHQ 2/9 Scores 10/13/2016 09/22/2016 08/23/2016 05/05/2016 03/28/2016 12/14/2015 08/05/2015  PHQ - 2 Score 0 0 0 0 0 0 0    Assessment: Patient continues to benefit from health coach outreach for disease management and support.   THN CM Care Plan Problem One     Most Recent Value  Care Plan Problem One  Diabetes Knowledge deficit  Role Documenting the Problem One  Health [place] for Problem One  Active  THN Long Term Goal   Patient will continue to keep blood sugars less than 150 within the next 90 days.   THN Long Term Goal Start Date  10/13/16 [goal continued]  Interventions for Problem One Long Term Goal  RN Health Coach reinterated with  patient to continue daily routine and limit carbohydrates and sweets.  Also encouraged the patient to continue with his efforts.     Plan: RN Health Coach will contact patient in the month of January and patient agrees to next outreach.  [name] RN, BSN, [place] Direct Dial:  [phone] Fax: [phone]

## 2017-01-15 NOTE — Progress Notes (Signed)
HPI [name] returns today for ongoing evaluation and management of complete heart block status post pacemaker insertion and coronary artery disease status post stenting and bypass surgery.  In the interim he has been stable.  He denies anginal symptoms.  He denies shortness of breath or peripheral edema.  He remains active taking care of his wife who is been ill. Allergies  Allergen Reactions  . Crestor [Rosuvastatin Calcium] Other (See Comments)    Urination of blood  . Latex Rash and Other (See Comments)    Gloves cause welts on hands!!     Current Outpatient Medications  Medication Sig Dispense Refill  . allopurinol (ZYLOPRIM) 300 MG tablet take 1 tablet by mouth once daily (Patient taking differently: Take 300 mg by mouth daily. ) 90 tablet 3  . atorvastatin (LIPITOR) 80 MG tablet TAKE 1 TABLET BY MOUTH  DAILY 90 tablet 0  . Blood Glucose Monitoring Suppl (ONE TOUCH ULTRA 2) w/Device KIT Use as directed 1 each 3  . carvedilol (COREG) 25 MG tablet TAKE 1 TABLET BY MOUTH TWO  TIMES DAILY 180 tablet 0  . Cholecalciferol (VITAMIN D) 1000 UNITS capsule Take 1,000 Units by mouth 2 (two) times daily. Reported on 02/15/2015    . donepezil (ARICEPT) 5 MG tablet TAKE 1 TABLET BY MOUTH AT  BEDTIME 90 tablet 1  . fluocinonide cream (LIDEX) 7.94 % Apply 1 application topically 2 (two) times daily. 30 g 2  . fluticasone (FLONASE) 50 MCG/ACT nasal spray USE 2 SPRAYS IN EACH  NOSTRIL EVERY DAY 48 g 0  . furosemide (LASIX) 40 MG tablet TAKE 1 TABLET BY MOUTH  DAILY 90 tablet 2  . gabapentin (NEURONTIN) 300 MG capsule TAKE 2 CAPSULES BY MOUTH 3  TIMES DAILY 540 capsule 0  . glipiZIDE (GLUCOTROL XL) 10 MG 24 hr tablet TAKE 1 TABLET BY MOUTH  DAILY WITH BREAKFAST 90 tablet 0  . HYDROcodone-acetaminophen (NORCO) 10-325 MG tablet Take 1 tablet by mouth daily as needed. 30 tablet 0  . indomethacin (INDOCIN) 50 MG capsule Take 1 capsule (50 mg total) by mouth 3 (three) times daily as needed. 270  capsule 3  . lisinopril (PRINIVIL,ZESTRIL) 2.5 MG tablet TAKE 1 TABLET BY MOUTH  DAILY 90 tablet 0  . metFORMIN (GLUCOPHAGE) 500 MG tablet TAKE 2 TABLETS BY MOUTH TWO TIMES DAILY 360 tablet 0  . Multiple Vitamin (MULTIVITAMIN WITH MINERALS) TABS tablet Take 1 tablet by mouth every morning.    . ONE TOUCH ULTRA TEST test strip CHECK BLOOD SUGAR TWO TIMES DAILY 200 each 1  . ONETOUCH DELICA LANCETS 32X MISC USE AS DIRECTED THREE TIMES DAILY TO CHECK BLOOD SUGAR 300 each 0  . oxybutynin (DITROPAN XL) 10 MG 24 hr tablet Take 1 tablet (10 mg total) by mouth at bedtime. 90 tablet 3  . potassium chloride SA (K-DUR,KLOR-CON) 20 MEQ tablet TAKE 1 TABLET BY MOUTH  EVERY EVENING 90 tablet 0  . sitaGLIPtin (JANUVIA) 100 MG tablet Take 1 tablet (100 mg total) by mouth daily. 90 tablet 3  . tamsulosin (FLOMAX) 0.4 MG CAPS capsule TAKE 1 CAPSULE BY MOUTH  EVERY 12 HOURS 180 capsule 0  . tiZANidine (ZANAFLEX) 4 MG tablet TAKE 1 TABLET BY MOUTH  EVERY 6 HOURS AS NEEDED FOR MUSCLE SPASMS. 180 tablet 0  . traMADol (ULTRAM) 50 MG tablet Take 1 tablet (50 mg total) by mouth every 8 (eight) hours as needed. 60 tablet 0  . warfarin (COUMADIN) 5 MG tablet  TAKE 1 TABLET BY MOUTH DAILY OR AS DIRECTED BY COUMADIN CLINIC 90 tablet 1   No current facility-administered medications for this visit.      Past Medical History:  Diagnosis Date  . Aortic root dilatation ([place]) 02/02/2011  . Arthritis    "lower back; going back down both my sciatic nerves"  . 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 BENIGN PROSTATIC HYPERTROPHY 08/29/2006   takes Flomax daily  . 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 Cataracts, bilateral    immature  . CHF (congestive heart failure) (HCC)    takes Lasix daily  . Chronic back pain    HNP   . CORONARY ARTERY DISEASE 08/29/2006   takes Coumadin daily  . DEPRESSION 08/29/2006  . DIABETES MELLITUS, TYPE II 08/29/2006   takes Metformin,Januvia,and Glipizide  daily  . DIASTOLIC HEART  FAILURE, CHRONIC 06/09/2008  . GOUT 04/22/2007   takes Allopurinol daily  . History of migraine    21yr ago  . HYPERLIPIDEMIA 08/29/2006   takes Atorvastatin daily  . HYPERTENSION 08/29/2006   takes Lisinopril daily  . INSOMNIA-SLEEP DISORDER-UNSPEC 10/23/2007  . Left lumbar radiculopathy 05/30/2010  . LUMBAR RADICULOPATHY, RIGHT 06/10/2007  . Muscle spasm    takes Zanaflex daily  . Myocardial infarction ([place] 12/27/10   "I've had several MIs"  . 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 PACEMAKER, PERMANENT 03/04/2008   pt denies this date  . Peripheral neuropathy    takes Gabapentin daily  . Presence of permanent cardiac pacemaker   . Shortness of breath dyspnea    "all my life" with exertion  . Sleep apnea    "if I lay flat I quit breathing; HOB up I'm fine"    ROS:   All systems reviewed and negative except as noted in the HPI.   Past Surgical History:  Procedure Laterality Date  . COLONOSCOPY    . CORONARY ANGIOPLASTY WITH STENT PLACEMENT  12/27/10   "I've had a total of 9 cardiac stents put in"  . CORONARY ARTERY BYPASS GRAFT  1992   CABG X 2  . ESOPHAGOGASTRODUODENOSCOPY N/A 12/01/2013   Procedure: ESOPHAGOGASTRODUODENOSCOPY (EGD);  Surgeon: DLafayette Dragon MD;  [facility];  Service: Endoscopy;  Laterality: N/A;  . INSERT / REPLACE / REMOVE PACEMAKER  ~ 2004   initial pacemaker placement  . INSERT / REPLACE / REMOVE PACEMAKER  10/2009   generator change  . LUMBAR LAMINECTOMY/DECOMPRESSION MICRODISCECTOMY Right 03/23/2014   Procedure: LAMINECTOMY AND FORAMINOTOMY RIGHT LUMBAR THREE-FOUR,LUMBAR FOUR-FIVE, LUMBAR FIVE-SACRAL ONE;  Surgeon: HCharlie Pitter MD;  [facility];  Service: Neurosurgery;  Laterality: Right;  right  . LUMBAR LAMINECTOMY/DECOMPRESSION MICRODISCECTOMY Left 12/01/2014   Procedure: Left Lumbar Three-Four, Lumbar Four-Five Laminectomy and Foraminotomy;  Surgeon: HEarnie Larsson MD;  [facility];  Service: Neurosurgery;   Laterality: Left;  . s/p left arm surgury after work accident  1991   "2000# steel fell on it"  . s/p right hand surgury for foreign object  1970's   "piece of wood went in my hand; had to get that out"     Family History  Problem Relation Age of Onset  . Diabetes Mother   . Diabetes Sister   . Heart disease Sister        2 sister died with heart disease  . Coronary artery disease Other 571      male, first degree relative  . Diabetes Other        1st  degree relative  . Heart disease Sister   . Lung cancer Sister        deceased     Social History   Socioeconomic History  . Marital status: Married    Spouse name: Not on file  . Number of children: 2  . Years of education: Not on file  . Highest education level: Not on file  Social Needs  . Financial resource strain: Not on file  . Food insecurity - worry: Not on file  . Food insecurity - inability: Not on file  . Transportation needs - medical: Not on file  . Transportation needs - non-medical: Not on file  Occupational History  . Occupation: prior work Designer, industrial/product: UNEMPLOYED  . Occupation: disabled since 2004  Tobacco Use  . Smoking status: Former Smoker    Packs/day: 3.00    Years: 9.00    Pack years: 27.00    Types: Cigarettes    Last attempt to quit: 02/26/1973    Years since quitting: 43.9  . Smokeless tobacco: Former Systems developer  . Tobacco comment: quit smoking 60yr ago  Substance and Sexual Activity  . Alcohol use: No    Alcohol/week: 0.0 oz  . Drug use: No    Comment: "used pouches of tobacco for a long time; quit those 12/09/1970"  . Sexual activity: Yes  Other Topics Concern  . Not on file  Social History Narrative  . Not on file     BP 134/80   Pulse 86   Ht '5\' 11"'  (1.803 m)   Wt 219 lb 3.2 oz (99.4 kg)   SpO2 93% Comment: on room air  BMI 30.57 kg/m   Physical Exam:  Well appearing 71year old man, NAD HEENT: Unremarkable Neck: 6 cm JVD, no thyromegally Lymphatics:  No  adenopathy Back:  No CVA tenderness Lungs:  Clear, with no wheezes, rales, or rhonchi HEART:  Regular rate rhythm, no murmurs, no rubs, no clicks Abd:  soft, positive bowel sounds, no organomegally, no rebound, no guarding Ext:  2 plus pulses, no edema, no cyanosis, no clubbing Skin:  No rashes no nodules Neuro:  CN II through XII intact, motor grossly intact   DEVICE  Normal device function.  See PaceArt for details.  He is chronically in atrial fibrillation  Assess/Plan: 1.  Chronic atrial fibrillation -his ventricular rate is well controlled as he is pacing almost 100% of the time. 2.  Complete heart block -he is asymptomatic status post permanent pacemaker insertion. 3.  Coronary artery disease -he is status post bypass surgery and is currently asymptomatic.  No change in medical therapy. 4.  Pacemaker -interrogation of his Medtronic pacemaker programmed VVIR demonstrates normal device function.  He has approximately 5 years of battery longevity.  GCrissie Sickles MD

## 2017-01-15 NOTE — Patient Instructions (Signed)
Medication Instructions:  Your physician recommends that you continue on your current medications as directed. Please refer to the Current Medication list given to you today.   Labwork: NONE   Testing/Procedures: NONE   Follow-Up: Your physician wants you to follow-up in: 1 Year with Dr. Lovena Le. You will receive a reminder letter in the mail two months in advance. If you don't receive a letter, please call our office to schedule the follow-up appointment.  Your physician wants you to follow-up in: 6 Months in the [place] will receive a reminder letter in the mail two months in advance. If you don't receive a letter, please call our office to schedule the follow-up appointment.   Any Other Special Instructions Will Be Listed Below (If Applicable).     If you need a refill on your cardiac medications before your next appointment, please call your pharmacy.  Thank you for choosing [facility]!

## 2017-01-16 LAB — CUP PACEART INCLINIC DEVICE CHECK
Battery Impedance: 1042 Ohm
Brady Statistic RV Percent Paced: 100 %
Implantable Lead Location: 753859
Implantable Lead Location: 753860
Implantable Lead Model: 5076
Lead Channel Impedance Value: 455 Ohm
Lead Channel Impedance Value: 67 Ohm
Lead Channel Pacing Threshold Amplitude: 0.75 V
Lead Channel Pacing Threshold Pulse Width: 0.4 ms
Lead Channel Setting Pacing Amplitude: 2.5 V
MDC IDC LEAD IMPLANT DT: 20030730
MDC IDC LEAD IMPLANT DT: 20030730
MDC IDC MSMT BATTERY REMAINING LONGEVITY: 61 mo
MDC IDC MSMT BATTERY VOLTAGE: 2.76 V
MDC IDC MSMT LEADCHNL RV PACING THRESHOLD AMPLITUDE: 0.625 V
MDC IDC MSMT LEADCHNL RV PACING THRESHOLD PULSEWIDTH: 0.4 ms
MDC IDC PG IMPLANT DT: 20111004
MDC IDC SESS DTM: 20181217185703
MDC IDC SET LEADCHNL RV PACING PULSEWIDTH: 0.4 ms
MDC IDC SET LEADCHNL RV SENSING SENSITIVITY: 2.8 mV

## 2017-01-26 ENCOUNTER — Encounter (HOSPITAL_COMMUNITY): Payer: Self-pay

## 2017-01-26 ENCOUNTER — Emergency Department (HOSPITAL_COMMUNITY): Payer: Medicare Other

## 2017-01-26 ENCOUNTER — Emergency Department (HOSPITAL_COMMUNITY)
Admission: EM | Admit: 2017-01-26 | Discharge: 2017-01-26 | Disposition: A | Discharge status: Home / Self Care | Payer: Medicare Other | Attending: Emergency Medicine | Admitting: Emergency Medicine

## 2017-01-26 DIAGNOSIS — Z951 Presence of aortocoronary bypass graft: Secondary | ICD-10-CM | POA: Insufficient documentation

## 2017-01-26 DIAGNOSIS — Z955 Presence of coronary angioplasty implant and graft: Secondary | ICD-10-CM | POA: Diagnosis not present

## 2017-01-26 DIAGNOSIS — Z7984 Long term (current) use of oral hypoglycemic drugs: Secondary | ICD-10-CM | POA: Diagnosis not present

## 2017-01-26 DIAGNOSIS — M546 Pain in thoracic spine: Secondary | ICD-10-CM | POA: Diagnosis not present

## 2017-01-26 DIAGNOSIS — S39012A Strain of muscle, fascia and tendon of lower back, initial encounter: Secondary | ICD-10-CM | POA: Diagnosis not present

## 2017-01-26 DIAGNOSIS — Y999 Unspecified external cause status: Secondary | ICD-10-CM | POA: Insufficient documentation

## 2017-01-26 DIAGNOSIS — T148XXA Other injury of unspecified body region, initial encounter: Secondary | ICD-10-CM | POA: Diagnosis not present

## 2017-01-26 DIAGNOSIS — I5032 Chronic diastolic (congestive) heart failure: Secondary | ICD-10-CM | POA: Diagnosis not present

## 2017-01-26 DIAGNOSIS — R51 Headache: Secondary | ICD-10-CM | POA: Insufficient documentation

## 2017-01-26 DIAGNOSIS — Z87891 Personal history of nicotine dependence: Secondary | ICD-10-CM | POA: Diagnosis not present

## 2017-01-26 DIAGNOSIS — S0990XA Unspecified injury of head, initial encounter: Secondary | ICD-10-CM | POA: Diagnosis not present

## 2017-01-26 DIAGNOSIS — Z950 Presence of cardiac pacemaker: Secondary | ICD-10-CM | POA: Insufficient documentation

## 2017-01-26 DIAGNOSIS — I2510 Atherosclerotic heart disease of native coronary artery without angina pectoris: Secondary | ICD-10-CM | POA: Diagnosis not present

## 2017-01-26 DIAGNOSIS — S3991XA Unspecified injury of abdomen, initial encounter: Secondary | ICD-10-CM | POA: Diagnosis not present

## 2017-01-26 DIAGNOSIS — Z7901 Long term (current) use of anticoagulants: Secondary | ICD-10-CM | POA: Diagnosis not present

## 2017-01-26 DIAGNOSIS — I517 Cardiomegaly: Secondary | ICD-10-CM | POA: Diagnosis not present

## 2017-01-26 DIAGNOSIS — Y939 Activity, unspecified: Secondary | ICD-10-CM | POA: Insufficient documentation

## 2017-01-26 DIAGNOSIS — Z79899 Other long term (current) drug therapy: Secondary | ICD-10-CM | POA: Insufficient documentation

## 2017-01-26 DIAGNOSIS — S199XXA Unspecified injury of neck, initial encounter: Secondary | ICD-10-CM | POA: Diagnosis not present

## 2017-01-26 DIAGNOSIS — G4489 Other headache syndrome: Secondary | ICD-10-CM | POA: Diagnosis not present

## 2017-01-26 DIAGNOSIS — Y929 Unspecified place or not applicable: Secondary | ICD-10-CM | POA: Insufficient documentation

## 2017-01-26 DIAGNOSIS — I110 Hypertensive heart disease with heart failure: Secondary | ICD-10-CM | POA: Diagnosis not present

## 2017-01-26 DIAGNOSIS — S299XXA Unspecified injury of thorax, initial encounter: Secondary | ICD-10-CM | POA: Diagnosis not present

## 2017-01-26 DIAGNOSIS — S161XXA Strain of muscle, fascia and tendon at neck level, initial encounter: Secondary | ICD-10-CM | POA: Diagnosis not present

## 2017-01-26 DIAGNOSIS — M542 Cervicalgia: Secondary | ICD-10-CM | POA: Diagnosis not present

## 2017-01-26 LAB — PROTIME-INR
INR: 3.8
PROTHROMBIN TIME: 37.2 s — AB (ref 11.4–15.2)

## 2017-01-26 LAB — CBC
HCT: 35 % — ABNORMAL LOW (ref 39.0–52.0)
Hemoglobin: 11.2 g/dL — ABNORMAL LOW (ref 13.0–17.0)
MCH: 27.7 pg (ref 26.0–34.0)
MCHC: 32 g/dL (ref 30.0–36.0)
MCV: 86.6 fL (ref 78.0–100.0)
PLATELETS: 151 10*3/uL (ref 150–400)
RBC: 4.04 MIL/uL — ABNORMAL LOW (ref 4.22–5.81)
RDW: 14.3 % (ref 11.5–15.5)
WBC: 6.2 10*3/uL (ref 4.0–10.5)

## 2017-01-26 LAB — COMPREHENSIVE METABOLIC PANEL
ALBUMIN: 3.6 g/dL (ref 3.5–5.0)
ALK PHOS: 78 U/L (ref 38–126)
ALT: 42 U/L (ref 17–63)
AST: 39 U/L (ref 15–41)
Anion gap: 9 (ref 5–15)
BUN: 32 mg/dL — AB (ref 6–20)
CALCIUM: 8.8 mg/dL — AB (ref 8.9–10.3)
CHLORIDE: 103 mmol/L (ref 101–111)
CO2: 24 mmol/L (ref 22–32)
CREATININE: 0.99 mg/dL (ref 0.61–1.24)
GFR calc Af Amer: >60 mL/min (ref >60)
GFR calc non Af Amer: >60 mL/min (ref >60)
GLUCOSE: 129 mg/dL — AB (ref 65–99)
Potassium: 4.6 mmol/L (ref 3.5–5.1)
SODIUM: 136 mmol/L (ref 135–145)
Total Bilirubin: 0.5 mg/dL (ref 0.3–1.2)
Total Protein: 6.5 g/dL (ref 6.5–8.1)

## 2017-01-26 LAB — SAMPLE TO BLOOD BANK

## 2017-01-26 MED ORDER — METHOCARBAMOL 500 MG PO TABS: 500.0000 mg | ORAL_TABLET | Freq: Three times a day (TID) | ORAL | 0 refills | Status: DC | PRN

## 2017-01-26 MED ORDER — IOPAMIDOL (ISOVUE-300) INJECTION 61%: 100.0000 mL | Freq: Once | INTRAVENOUS | Status: DC | PRN

## 2017-01-26 MED ORDER — HYDROCODONE-ACETAMINOPHEN 5-325 MG PO TABS
1.0000 | ORAL_TABLET | Freq: Once | ORAL | Status: AC
  Administered 2017-01-26: 1 via ORAL
  Filled 2017-01-26: qty 1

## 2017-01-26 MED ORDER — FENTANYL CITRATE (PF) 100 MCG/2ML IJ SOLN
50.0000 ug | Freq: Once | INTRAMUSCULAR | Status: AC
  Administered 2017-01-26: 50 ug via INTRAVENOUS
  Filled 2017-01-26: qty 2

## 2017-01-26 MED ORDER — HYDROCODONE-ACETAMINOPHEN 5-325 MG PO TABS: 1.0000 | ORAL_TABLET | Freq: Four times a day (QID) | ORAL | 0 refills | Status: DC | PRN
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
## 2017-01-26 NOTE — ED Notes (Signed)
Pt back from CT
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
## 2017-01-26 NOTE — ED Notes (Signed)
Patient given discharge instruction, verbalized understand. IV removed, band aid applied. Patient ambulatory out of the department.  
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
## 2017-01-29 ENCOUNTER — Ambulatory Visit: Payer: Medicare Other | Admitting: Family Medicine

## 2017-01-31 ENCOUNTER — Ambulatory Visit: Payer: Medicare Other | Admitting: Internal Medicine

## 2017-02-01 ENCOUNTER — Encounter: Payer: Self-pay | Admitting: Internal Medicine

## 2017-02-01 ENCOUNTER — Ambulatory Visit (INDEPENDENT_AMBULATORY_CARE_PROVIDER_SITE_OTHER): Payer: Medicare Other | Admitting: Internal Medicine

## 2017-02-01 VITALS — BP 128/78 | HR 73 | Temp 97.8°F | Ht 71.0 in | Wt 223.0 lb

## 2017-02-01 DIAGNOSIS — Z23 Encounter for immunization: Secondary | ICD-10-CM

## 2017-02-01 DIAGNOSIS — Z0000 Encounter for general adult medical examination without abnormal findings: Secondary | ICD-10-CM | POA: Diagnosis not present

## 2017-02-01 DIAGNOSIS — E1140 Type 2 diabetes mellitus with diabetic neuropathy, unspecified: Secondary | ICD-10-CM | POA: Diagnosis not present

## 2017-02-01 DIAGNOSIS — E1165 Type 2 diabetes mellitus with hyperglycemia: Secondary | ICD-10-CM

## 2017-02-01 DIAGNOSIS — IMO0002 Reserved for concepts with insufficient information to code with codable children: Secondary | ICD-10-CM

## 2017-02-01 LAB — POCT GLYCOSYLATED HEMOGLOBIN (HGB A1C): Hemoglobin A1C: 6.8

## 2017-02-01 MED ORDER — HYDROCODONE-ACETAMINOPHEN 10-325 MG PO TABS: 1.0000 | ORAL_TABLET | Freq: Every day | ORAL | 0 refills | Status: DC | PRN

## 2017-02-01 NOTE — Patient Instructions (Addendum)
You hadthe pneumovax pneumonia shot today  Your A1c was OK today  Please continue all other medications as before, and refills have been done if requested - the hydrocodone  Please have the pharmacy call with any other refills you may need.  Please continue your efforts at being more active, low cholesterol diet, and weight control.  You are otherwise up to date with prevention measures today.  Please keep your appointments with your specialists as you may have planned  Please return in 6 months, or sooner if needed, with Lab testing done 3-5 days before
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
## 2017-02-03 ENCOUNTER — Encounter: Payer: Self-pay | Admitting: Internal Medicine

## 2017-02-03 NOTE — Assessment & Plan Note (Signed)

## 2017-02-03 NOTE — Assessment & Plan Note (Signed)
stable overall by history and exam, recent data reviewed with pt, and pt to continue medical treatment as before,  to f/u any worsening symptoms or concerns  

## 2017-02-07 ENCOUNTER — Ambulatory Visit (INDEPENDENT_AMBULATORY_CARE_PROVIDER_SITE_OTHER): Payer: Medicare Other | Admitting: Internal Medicine

## 2017-02-07 ENCOUNTER — Encounter: Payer: Self-pay | Admitting: Internal Medicine

## 2017-02-07 VITALS — BP 142/82 | HR 83 | Temp 98.3°F | Ht 71.0 in | Wt 223.0 lb

## 2017-02-07 DIAGNOSIS — M5412 Radiculopathy, cervical region: Secondary | ICD-10-CM | POA: Diagnosis not present

## 2017-02-07 DIAGNOSIS — M542 Cervicalgia: Secondary | ICD-10-CM | POA: Diagnosis not present

## 2017-02-07 DIAGNOSIS — IMO0002 Reserved for concepts with insufficient information to code with codable children: Secondary | ICD-10-CM

## 2017-02-07 DIAGNOSIS — I10 Essential (primary) hypertension: Secondary | ICD-10-CM

## 2017-02-07 DIAGNOSIS — E1140 Type 2 diabetes mellitus with diabetic neuropathy, unspecified: Secondary | ICD-10-CM | POA: Diagnosis not present

## 2017-02-07 DIAGNOSIS — E1165 Type 2 diabetes mellitus with hyperglycemia: Secondary | ICD-10-CM | POA: Diagnosis not present

## 2017-02-07 MED ORDER — KETOROLAC TROMETHAMINE 30 MG/ML IJ SOLN
30.0000 mg | Freq: Once | INTRAMUSCULAR | Status: AC
  Administered 2017-02-07: 30 mg via INTRAMUSCULAR

## 2017-02-07 MED ORDER — PREDNISONE 10 MG PO TABS
ORAL_TABLET | ORAL | 0 refills | Status: DC
Start: 2017-02-07 — End: 2017-02-28
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
## 2017-02-07 NOTE — Patient Instructions (Signed)
You had the pain shot today (toradol 30 mg)  Please take all new medication as prescribed - the prednisone  Please continue all other medications as before, including the tramadol and muscle relaxer  Please have the pharmacy call with any other refills you may need.  Please keep your appointments with your specialists as you may have planned  You will be contacted regarding the referral for: MRI for the neck

## 2017-02-09 ENCOUNTER — Other Ambulatory Visit: Payer: Self-pay | Admitting: Internal Medicine

## 2017-02-09 ENCOUNTER — Encounter: Payer: Self-pay | Admitting: Internal Medicine

## 2017-02-09 NOTE — Assessment & Plan Note (Signed)
Mild to mod, to continue tramadol prn, zanaflex prn, add predpac asd, toradol 30 mg IM today, and MRI c spine, consider surgical referral

## 2017-02-09 NOTE — Assessment & Plan Note (Signed)
Mild elevated likely situaitonal, o/w stable overall by history and exam, recent data reviewed with pt, and pt to continue medical treatment as before,  to f/u any worsening symptoms or concerns BP Readings from Last 3 Encounters:  02/07/17 (!) 142/82  02/01/17 128/78  01/26/17 (!) 145/89

## 2017-02-09 NOTE — Assessment & Plan Note (Signed)
Lab Results  Component Value Date   HGBA1C 6.8 02/01/2017  stable overall by history and exam, recent data reviewed with pt, and pt to continue medical treatment as before,  to f/u any worsening symptoms or concerns

## 2017-02-12 ENCOUNTER — Ambulatory Visit (INDEPENDENT_AMBULATORY_CARE_PROVIDER_SITE_OTHER): Payer: Medicare Other | Admitting: *Deleted

## 2017-02-12 DIAGNOSIS — I483 Typical atrial flutter: Secondary | ICD-10-CM

## 2017-02-12 DIAGNOSIS — I4891 Unspecified atrial fibrillation: Secondary | ICD-10-CM

## 2017-02-12 DIAGNOSIS — Z5181 Encounter for therapeutic drug level monitoring: Secondary | ICD-10-CM

## 2017-02-12 DIAGNOSIS — Z7901 Long term (current) use of anticoagulants: Secondary | ICD-10-CM

## 2017-02-12 LAB — POCT INR: INR: 6

## 2017-02-12 NOTE — Patient Instructions (Signed)
Hold coumadin until repeat INR check on 02/14/17

## 2017-02-14 ENCOUNTER — Ambulatory Visit (INDEPENDENT_AMBULATORY_CARE_PROVIDER_SITE_OTHER): Payer: Medicare Other | Admitting: *Deleted

## 2017-02-14 DIAGNOSIS — Z7901 Long term (current) use of anticoagulants: Secondary | ICD-10-CM

## 2017-02-14 DIAGNOSIS — I483 Typical atrial flutter: Secondary | ICD-10-CM | POA: Diagnosis not present

## 2017-02-14 DIAGNOSIS — I4891 Unspecified atrial fibrillation: Secondary | ICD-10-CM

## 2017-02-14 LAB — POCT INR: INR: 2.7

## 2017-02-14 NOTE — Patient Instructions (Signed)
Restart coumadin 1/2 tablet daily except 1 tablet on Mondays, Wednesdays and Fridays Has been on prednisone for neck pain Has 3 days left of 10mg  Recheck in 3 wks

## 2017-02-15 ENCOUNTER — Ambulatory Visit: Payer: Self-pay

## 2017-02-16 ENCOUNTER — Other Ambulatory Visit: Payer: Self-pay

## 2017-02-16 NOTE — Patient Outreach (Signed)
[place] [facility]) Care Management  02/16/2017  BENYAMIN JEFF 03/11/1945 [id number]   Telephone call to patient for monthly outreach.  No answer. Unable to leave a message. Message on the phone stated that it is temporarily disconnected.  Plan:  RN Health coach will make an outreach call to the patient in the month of January.  [name] RN, BSN, [place] Direct Dial:  [phone] Fax: [phone]

## 2017-02-17 ENCOUNTER — Other Ambulatory Visit: Payer: Self-pay | Admitting: Internal Medicine

## 2017-02-21 ENCOUNTER — Other Ambulatory Visit: Payer: Self-pay

## 2017-02-21 NOTE — Patient Outreach (Signed)
[place] [facility]) Care Management  02/21/2017  ZACH TIETJE [birth year] [id number]   2nd telephone call to the patient for monthly assessment.  Unable to leave a message. Message on the phone stated that it is temporarily disconnected  Plan: Plan:  RN Health coach will make an outreach call to the patient in the month of January.  [name] RN, BSN, [place] Direct Dial:  [phone] Fax: [phone]

## 2017-02-23 ENCOUNTER — Ambulatory Visit: Payer: Medicare Other | Admitting: Podiatry

## 2017-02-23 ENCOUNTER — Encounter: Payer: Self-pay | Admitting: Podiatry

## 2017-02-23 DIAGNOSIS — B351 Tinea unguium: Secondary | ICD-10-CM | POA: Diagnosis not present

## 2017-02-23 DIAGNOSIS — E1142 Type 2 diabetes mellitus with diabetic polyneuropathy: Secondary | ICD-10-CM

## 2017-02-23 DIAGNOSIS — D689 Coagulation defect, unspecified: Secondary | ICD-10-CM

## 2017-02-23 DIAGNOSIS — M79676 Pain in unspecified toe(s): Secondary | ICD-10-CM | POA: Diagnosis not present

## 2017-02-23 NOTE — Progress Notes (Signed)
Complaint:  Visit Type: Patient returns to my office for continued preventative foot care services. Complaint: Patient states" my nails have grown long and thick and become painful to walk and wear shoes" Patient has been diagnosed with DM with no foot complications. The patient presents for preventative foot care services. No changes to ROS.  Patient is on coumadin.  Podiatric Exam: Vascular: dorsalis pedis and posterior tibial pulses are palpable bilateral. Capillary return is immediate. Temperature gradient is WNL. Skin turgor WNL  Sensorium: Normal Semmes Weinstein monofilament test. Normal tactile sensation bilaterally. Nail Exam: Pt has thick disfigured discolored nails with subungual debris noted bilateral entire nail hallux through fifth toenails Ulcer Exam: There is no evidence of ulcer or pre-ulcerative changes or infection. Orthopedic Exam: Muscle tone and strength are WNL. No limitations in general ROM. No crepitus or effusions noted. Foot type and digits show no abnormalities. Bony prominences are unremarkable. Skin: No Porokeratosis. No infection or ulcers  Diagnosis:  Onychomycosis, , Pain in right toe, pain in left toes  Treatment & Plan Procedures and Treatment: Consent by patient was obtained for treatment procedures.   Debridement of mycotic and hypertrophic toenails, 1 through 5 bilateral and clearing of subungual debris. No ulceration, no infection noted. Asymptomatic ecchymosis 3,4 toes  B/l. Return Visit-Office Procedure: Patient instructed to return to the office for a follow up visit 3 months for continued evaluation and treatment.    Gardiner Barefoot DPM

## 2017-02-27 ENCOUNTER — Other Ambulatory Visit: Payer: Self-pay

## 2017-02-27 ENCOUNTER — Telehealth: Payer: Self-pay | Admitting: Internal Medicine

## 2017-02-27 NOTE — Telephone Encounter (Signed)
Copied from [place]. Topic: Inquiry >> Feb 27, 2017  1:25 PM Corie Chiquito, [place] wrote: Reason for CRM:  Olivia Mackie called because she would like to let Dr.John's nurse know that he is having problems breathing and is using his wife's inhaler. Stated that he does have an appointment on 03-02-2017. She would like it if someone could give him a call just to check in on him until he makes it to the office on Friday. He can be reached at [phone]. If anyone has any further questions Olivia Mackie can be reached at [phone]

## 2017-02-27 NOTE — Patient Outreach (Addendum)
[place] [facility]) Care Management  02/27/2017  LIVINGSTON DENNER 06-Sep-1945 [id number]   Telephone call placed to the patient for monthly assessment. Wife answered the phone and stated that the patient was not at home.  HIPAA compliant message left with contact information.  Plan:  RN Health Coach will make an outreach attempt to the patient in the month of February.  [name] RN, BSN, [place] Direct Dial:  [phone] Fax: [phone]

## 2017-02-27 NOTE — Telephone Encounter (Signed)
Patient scheduled 1/30 at 8:20am

## 2017-02-27 NOTE — Patient Outreach (Signed)
[place] [facility]) Care Management  02/27/2017  TRISTAIN DAILY [birth year] [id number]   Telephone call to the patient for a monthly assessment. HIPAA verified. The patient states that he is not doing well today.  He states that he is having shortness of breath.  He stated that he is having to use his wife's inhaler.  The patient sounded winded on the phone. He denies any chest pain but states that he is very tired.  I informed the patient it is not good practice to use other people's medication. I also told the patient to sit and real.and deep breath and not to overexert himself.  I told the patient to be aware of his breathing and if it became increasingly hard to breath any chest pain or feeling dizzy to call 911. He stated that he checked his blood sugar this morning and it was 130.  He states that he is watching his diet.  RN Health Coach called and left a message for his primary care's nurse to give her information about the situation.. Called the patient back and informed him that I called and left a message.  Plan:  RN Health Coach will make an outreach attempt to the patient in one business day.  [name] RN, BSN, [place] Direct Dial:  [phone] Fax: [phone]

## 2017-02-28 ENCOUNTER — Telehealth: Payer: Self-pay

## 2017-02-28 ENCOUNTER — Encounter: Payer: Self-pay | Admitting: Internal Medicine

## 2017-02-28 ENCOUNTER — Ambulatory Visit (INDEPENDENT_AMBULATORY_CARE_PROVIDER_SITE_OTHER): Payer: Medicare Other | Admitting: Internal Medicine

## 2017-02-28 ENCOUNTER — Other Ambulatory Visit: Payer: Self-pay

## 2017-02-28 ENCOUNTER — Ambulatory Visit (INDEPENDENT_AMBULATORY_CARE_PROVIDER_SITE_OTHER)
Admission: RE | Admit: 2017-02-28 | Discharge: 2017-02-28 | Disposition: A | Discharge status: Home / Self Care | Payer: Medicare Other | Source: Ambulatory Visit | Attending: Internal Medicine | Admitting: Internal Medicine

## 2017-02-28 ENCOUNTER — Other Ambulatory Visit (INDEPENDENT_AMBULATORY_CARE_PROVIDER_SITE_OTHER): Payer: Medicare Other

## 2017-02-28 VITALS — BP 146/90 | HR 67 | Temp 97.8°F | Ht 71.0 in | Wt 220.0 lb

## 2017-02-28 DIAGNOSIS — E1165 Type 2 diabetes mellitus with hyperglycemia: Secondary | ICD-10-CM

## 2017-02-28 DIAGNOSIS — R0602 Shortness of breath: Secondary | ICD-10-CM | POA: Diagnosis not present

## 2017-02-28 DIAGNOSIS — R062 Wheezing: Secondary | ICD-10-CM

## 2017-02-28 DIAGNOSIS — E1140 Type 2 diabetes mellitus with diabetic neuropathy, unspecified: Secondary | ICD-10-CM

## 2017-02-28 DIAGNOSIS — I10 Essential (primary) hypertension: Secondary | ICD-10-CM | POA: Diagnosis not present

## 2017-02-28 DIAGNOSIS — IMO0002 Reserved for concepts with insufficient information to code with codable children: Secondary | ICD-10-CM

## 2017-02-28 LAB — BASIC METABOLIC PANEL
BUN: 16 mg/dL (ref 6–23)
CALCIUM: 9.1 mg/dL (ref 8.4–10.5)
CO2: 28 meq/L (ref 19–32)
Chloride: 106 mEq/L (ref 96–112)
Creatinine, Ser: 0.93 mg/dL (ref 0.40–1.50)
GFR: 85.12 mL/min (ref >60.00)
GLUCOSE: 163 mg/dL — AB (ref 70–99)
POTASSIUM: 4.8 meq/L (ref 3.5–5.1)
Sodium: 142 mEq/L (ref 135–145)

## 2017-02-28 LAB — CBC WITH DIFFERENTIAL/PLATELET
BASOS PCT: 0.3 % (ref 0.0–3.0)
Basophils Absolute: 0 10*3/uL (ref 0.0–0.1)
EOS PCT: 2 % (ref 0.0–5.0)
Eosinophils Absolute: 0.1 10*3/uL (ref 0.0–0.7)
HCT: 32.2 % — ABNORMAL LOW (ref 39.0–52.0)
HEMOGLOBIN: 10.5 g/dL — AB (ref 13.0–17.0)
Lymphocytes Relative: 17 % (ref 12.0–46.0)
Lymphs Abs: 1 10*3/uL (ref 0.7–4.0)
MCHC: 32.6 g/dL (ref 30.0–36.0)
MCV: 83.2 fl (ref 78.0–100.0)
Monocytes Absolute: 0.5 10*3/uL (ref 0.1–1.0)
Monocytes Relative: 8.2 % (ref 3.0–12.0)
Neutro Abs: 4.3 10*3/uL (ref 1.4–7.7)
Neutrophils Relative %: 72.5 % (ref 43.0–77.0)
Platelets: 153 10*3/uL (ref 150.0–400.0)
RBC: 3.87 Mil/uL — ABNORMAL LOW (ref 4.22–5.81)
RDW: 15.4 % (ref 11.5–15.5)
WBC: 5.9 10*3/uL (ref 4.0–10.5)

## 2017-02-28 LAB — BRAIN NATRIURETIC PEPTIDE: PRO B NATRI PEPTIDE: 283 pg/mL — AB (ref 0.0–100.0)

## 2017-02-28 MED ORDER — LEVOFLOXACIN 500 MG PO TABS: 500.0000 mg | ORAL_TABLET | Freq: Every day | ORAL | 0 refills | Status: AC

## 2017-02-28 MED ORDER — PREDNISONE 10 MG PO TABS: ORAL_TABLET | ORAL | 0 refills | Status: DC

## 2017-02-28 MED ORDER — ALBUTEROL SULFATE HFA 108 (90 BASE) MCG/ACT IN AERS: 2.0000 | INHALATION_SPRAY | Freq: Four times a day (QID) | RESPIRATORY_TRACT | 2 refills | Status: DC | PRN

## 2017-02-28 MED ORDER — BUDESONIDE-FORMOTEROL FUMARATE 160-4.5 MCG/ACT IN AERO: 2.0000 | INHALATION_SPRAY | Freq: Two times a day (BID) | RESPIRATORY_TRACT | 12 refills | Status: DC

## 2017-02-28 MED ORDER — METHYLPREDNISOLONE ACETATE 80 MG/ML IJ SUSP
80.0000 mg | Freq: Once | INTRAMUSCULAR | Status: AC
  Administered 2017-02-28: 80 mg via INTRAMUSCULAR

## 2017-02-28 MED ORDER — HYDROCODONE-HOMATROPINE 5-1.5 MG/5ML PO SYRP: 5.0000 mL | ORAL_SOLUTION | Freq: Four times a day (QID) | ORAL | 0 refills | Status: AC | PRN

## 2017-02-28 NOTE — Telephone Encounter (Signed)
-----   Message from Biagio Borg, MD sent at 02/28/2017 12:31 PM EST ----- Left message on MyChart, pt to cont same tx except  The test results show that your current treatment is OK, except the BNP is mildly elevated, indicating the possibility of some fluid in the lungs contributing to the wheezing.  Please take the furosemide at 40 mg twice per day (instead of once per day) for 3 days, then go back to Once per day.  Redmond Baseman to please inform pt,

## 2017-02-28 NOTE — Progress Notes (Signed)
Subjective:    Patient ID: [name], male    DOB: April 12, 1945, 72 y.o.   MRN: [id number]  HPI  ;Here with acute onset mild to mod 7 days ? Feverish but has had ST, HA, general weakness and malaise, with prod cough greenish yellow sputum with gruadually worsening sob and wheezing and doe, some improved by wife's inhaler but he does not know the name, last used this am, but Pt denies chest pain, wt gain, orthopnea, PND, increased LE swelling, palpitations, or syncope, though has felt some dizzy at times with postural change.  Has lost 3 lbs apparently from last visit  Pt has no known documented COPD or asthma, last episode like this 2013.  Pt denies polydipsia, polyuria, or low sugar symptoms such as weakness or confusion improved with po intake.  Pt states overall good compliance with meds, trying to follow lower cholesterol, diabetic diet, wt overall stable but little exercise however.   Pt denies new neurological symptoms such as new headache, or facial or extremity weakness or numbness  Wt Readings from Last 3 Encounters:  02/28/17 220 lb (99.8 kg)  02/07/17 223 lb (101.2 kg)  02/01/17 223 lb (101.2 kg)   Past Medical History:  Diagnosis Date  . Aortic root dilatation ([place]) 02/02/2011  . Arthritis    "lower back; going back down both my sciatic nerves"  . 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 BENIGN PROSTATIC HYPERTROPHY 08/29/2006   takes Flomax daily  . 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 Cataracts, bilateral    immature  . CHF (congestive heart failure) (HCC)    takes Lasix daily  . Chronic back pain    HNP   . CORONARY ARTERY DISEASE 08/29/2006   takes Coumadin daily  . DEPRESSION 08/29/2006  . DIABETES MELLITUS, TYPE II 08/29/2006   takes Metformin,Januvia,and Glipizide  daily  . DIASTOLIC HEART FAILURE, CHRONIC 06/09/2008  . GOUT 04/22/2007   takes Allopurinol daily  . History of migraine    48yr ago  . HYPERLIPIDEMIA 08/29/2006   takes Atorvastatin  daily  . HYPERTENSION 08/29/2006   takes Lisinopril daily  . INSOMNIA-SLEEP DISORDER-UNSPEC 10/23/2007  . Left lumbar radiculopathy 05/30/2010  . LUMBAR RADICULOPATHY, RIGHT 06/10/2007  . Muscle spasm    takes Zanaflex daily  . Myocardial infarction ([facility] 12/27/10   "I've had several MIs"  . 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 PACEMAKER, PERMANENT 03/04/2008   pt denies this date  . Peripheral neuropathy    takes Gabapentin daily  . Presence of permanent cardiac pacemaker   . Shortness of breath dyspnea    "all my life" with exertion  . Sleep apnea    "if I lay flat I quit breathing; HOB up I'm fine"   Past Surgical History:  Procedure Laterality Date  . COLONOSCOPY    . CORONARY ANGIOPLASTY WITH STENT PLACEMENT  12/27/10   "I've had a total of 9 cardiac stents put in"  . CORONARY ARTERY BYPASS GRAFT  1992   CABG X 2  . ESOPHAGOGASTRODUODENOSCOPY N/A 12/01/2013   Procedure: ESOPHAGOGASTRODUODENOSCOPY (EGD);  Surgeon: DLafayette Dragon MD;  [facility];  Service: Endoscopy;  Laterality: N/A;  . INSERT / REPLACE / REMOVE PACEMAKER  ~ 2004   initial pacemaker placement  . INSERT / REPLACE / REMOVE PACEMAKER  10/2009   generator change  . LUMBAR LAMINECTOMY/DECOMPRESSION MICRODISCECTOMY Right 03/23/2014   Procedure: LAMINECTOMY AND FORAMINOTOMY RIGHT LUMBAR THREE-FOUR,LUMBAR FOUR-FIVE, LUMBAR FIVE-SACRAL ONE;  Surgeon: HCooper Render  Pool, MD;  Location: [place] [facility];  Service: Neurosurgery;  Laterality: Right;  right  . LUMBAR LAMINECTOMY/DECOMPRESSION MICRODISCECTOMY Left 12/01/2014   Procedure: Left Lumbar Three-Four, Lumbar Four-Five Laminectomy and Foraminotomy;  Surgeon: Earnie Larsson, MD;  Location: [place] [facility];  Service: Neurosurgery;  Laterality: Left;  . s/p left arm surgury after work accident  1991   "2000# steel fell on it"  . s/p right hand surgury for foreign object  1970's   "piece of wood went in my hand; had to get that out"    reports that he quit smoking about  44 years ago. His smoking use included cigarettes. He has a 27.00 pack-year smoking history. He has quit using smokeless tobacco. He reports that he does not drink alcohol or use drugs. family history includes Coronary artery disease (age of onset: 66) in his other; Diabetes in his mother, other, and sister; Heart disease in his sister and sister; Lung cancer in his sister. Allergies  Allergen Reactions  . Crestor [Rosuvastatin Calcium] Other (See Comments)    Urination of blood  . Latex Rash and Other (See Comments)    Gloves cause welts on hands!!   Current Outpatient Medications on File Prior to Visit  Medication Sig Dispense Refill  . allopurinol (ZYLOPRIM) 300 MG tablet take 1 tablet by mouth once daily (Patient taking differently: Take 300 mg by mouth daily. ) 90 tablet 3  . atorvastatin (LIPITOR) 80 MG tablet TAKE 1 TABLET BY MOUTH  DAILY 90 tablet 3  . Blood Glucose Monitoring Suppl (ONE TOUCH ULTRA 2) w/Device KIT Use as directed 1 each 3  . carvedilol (COREG) 25 MG tablet TAKE 1 TABLET BY MOUTH TWO  TIMES DAILY 180 tablet 3  . Cholecalciferol (VITAMIN D) 1000 UNITS capsule Take 1,000 Units by mouth 2 (two) times daily. Reported on 02/15/2015    . donepezil (ARICEPT) 5 MG tablet TAKE 1 TABLET BY MOUTH AT  BEDTIME 90 tablet 1  . fluocinonide cream (LIDEX) 8.03 % Apply 1 application topically 2 (two) times daily. 30 g 2  . fluticasone (FLONASE) 50 MCG/ACT nasal spray USE 2 SPRAYS IN EACH  NOSTRIL EVERY DAY 48 g 0  . furosemide (LASIX) 40 MG tablet TAKE 1 TABLET BY MOUTH  DAILY 90 tablet 2  . gabapentin (NEURONTIN) 300 MG capsule TAKE 2 CAPSULES BY MOUTH 3  TIMES DAILY (Patient taking differently: TAKE 1 CAPSULE BY MOUTH 3  TIMES DAILY) 540 capsule 3  . glipiZIDE (GLUCOTROL XL) 10 MG 24 hr tablet TAKE 1 TABLET BY MOUTH  DAILY WITH BREAKFAST 90 tablet 3  . HYDROcodone-acetaminophen (NORCO) 10-325 MG tablet Take 1 tablet by mouth daily as needed. 30 tablet 0  . lisinopril (PRINIVIL,ZESTRIL)  2.5 MG tablet TAKE 1 TABLET BY MOUTH  DAILY 90 tablet 0  . metFORMIN (GLUCOPHAGE) 500 MG tablet TAKE 2 TABLETS BY MOUTH TWO TIMES DAILY 360 tablet 3  . methocarbamol (ROBAXIN) 500 MG tablet Take 1 tablet (500 mg total) by mouth every 8 (eight) hours as needed for muscle spasms. 30 tablet 0  . Multiple Vitamin (MULTIVITAMIN WITH MINERALS) TABS tablet Take 1 tablet by mouth every morning.    . ONE TOUCH ULTRA TEST test strip CHECK BLOOD SUGAR TWO TIMES DAILY 200 each 1  . ONETOUCH DELICA LANCETS 21Y MISC USE AS DIRECTED THREE TIMES DAILY TO CHECK BLOOD SUGAR 300 each 0  . oxybutynin (DITROPAN XL) 10 MG 24 hr tablet Take 1 tablet (10 mg total) by mouth at bedtime.  90 tablet 3  . potassium chloride SA (K-DUR,KLOR-CON) 20 MEQ tablet TAKE 1 TABLET BY MOUTH  EVERY EVENING 90 tablet 3  . predniSONE (DELTASONE) 10 MG tablet 3 tabs by mouth per day for 3 days,2tabs per day for 3 days,1tab per day for 3 days 18 tablet 0  . sitaGLIPtin (JANUVIA) 100 MG tablet Take 1 tablet (100 mg total) by mouth daily. 90 tablet 3  . tamsulosin (FLOMAX) 0.4 MG CAPS capsule TAKE 1 CAPSULE BY MOUTH  EVERY 12 HOURS 180 capsule 3  . tiZANidine (ZANAFLEX) 4 MG tablet TAKE 1 TABLET BY MOUTH  EVERY 6 HOURS AS NEEDED FOR MUSCLE SPASMS. 180 tablet 0  . traMADol (ULTRAM) 50 MG tablet Take 1 tablet (50 mg total) by mouth every 8 (eight) hours as needed. 60 tablet 0  . warfarin (COUMADIN) 5 MG tablet TAKE 1 TABLET BY MOUTH DAILY OR AS DIRECTED BY COUMADIN CLINIC 90 tablet 1   No current facility-administered medications on file prior to visit.    Review of Systems  Constitutional: Negative for other unusual diaphoresis or sweats HENT: Negative for ear discharge or swelling Eyes: Negative for other worsening visual disturbances Respiratory: Negative for stridor or other swelling  Gastrointestinal: Negative for worsening distension or other blood Genitourinary: Negative for retention or other urinary change Musculoskeletal: Negative  for other MSK pain or swelling Skin: Negative for color change or other new lesions Neurological: Negative for worsening tremors and other numbness  Psychiatric/Behavioral: Negative for worsening agitation or other fatigue All other system neg per pt    Objective:   Physical Exam BP (!) 146/90   Pulse 67   Temp 97.8 F (36.6 C) (Oral)   Ht _0  (1.803 m)   Wt 220 lb (99.8 kg)   SpO2 95%   BMI 30.68 kg/m   VS noted, mild ill, mild tachypneic and coughing without accessory muscle use, somewhat breathless to speak, feels warm but no fever Constitutional: Pt appears in NAD HENT: Head: NCAT.  Right Ear: External ear normal.  Left Ear: External ear normal.  Eyes: . Pupils are equal, round, and reactive to light. Conjunctivae and EOM are normal Nose: without d/c or deformity Neck: Neck supple. Gross normal ROM Cardiovascular: Normal rate and regular rhythm.   Pulmonary/Chest: Effort normal and breath sounds decreased without rales but with few bilat wheezing.  Neurological: Pt is alert. At baseline orientation, motor grossly intact Skin: Skin is warm. No rashes, other new lesions, no LE edema Psychiatric: Pt behavior is normal without agitation , mild nervous No other exam findings Lab Results  Component Value Date   WBC 6.2 01/26/2017   HGB 11.2 (L) 01/26/2017   HCT 35.0 (L) 01/26/2017   PLT 151 01/26/2017   GLUCOSE 129 (H) 01/26/2017   CHOL 101 10/17/2016   TRIG 60.0 10/17/2016   HDL 46.80 10/17/2016   LDLCALC 42 10/17/2016   ALT 42 01/26/2017   AST 39 01/26/2017   NA 136 01/26/2017   K 4.6 01/26/2017   CL 103 01/26/2017   CREATININE 0.99 01/26/2017   BUN 32 (H) 01/26/2017   CO2 24 01/26/2017   TSH 1.23 03/28/2016   PSA 3.06 06/15/2015   INR 2.7 02/14/2017   HGBA1C 6.8 02/01/2017   MICROALBUR 1.3 03/28/2016       Assessment & Plan:

## 2017-02-28 NOTE — Patient Outreach (Signed)
[facility]) Care Management  02/28/2017  [name] 1945-07-08 [id number]   Telephone call to the patient for follow up. Wife answered the phone and stated that the patient was sleep. HIPAA compliant message left with contact information.  Per chart review  the patient was seen at his primary care office this morning for shortness of breath.  Plan RN Health Coach will make an outreach attempt in the month of February.  [name] RN, BSN, [place] Direct Dial:  [phone] Fax: [phone]

## 2017-02-28 NOTE — Assessment & Plan Note (Signed)
Suspect most likely c/w asthmatic bronchitis episode; will tx as c/w copd like exacerbation but cant r/o underlying chf given his hx; for depomedrol 80 IM, predpac asd,, cough med prn, antibiotic, albuterol inhaler prn, and one month only use of symbicort for this acute issue; also for cxr, also for labs as ordered including the BNP

## 2017-02-28 NOTE — Telephone Encounter (Signed)
Pt has been informed and expressed understanding.  

## 2017-02-28 NOTE — Patient Instructions (Addendum)
You had the steroid shot today  Please take all new medication as prescribed - the antibiotic, cough medicine as needed, prednisone, Albuterol Inhaler for as needed use, and the Symbicort twice per day for 1 month only  Please continue all other medications as before, and refills have been done if requested.  Please have the pharmacy call with any other refills you may need.  Please continue your efforts at being more active, low cholesterol diet, and weight control  Please keep your appointments with your specialists as you may have planned  Please go to the XRAY Department in the Basement (go straight as you get off the elevator) for the x-ray testing  Please go to the LAB in the Basement (turn left off the elevator) for the tests to be done today  You will be contacted by phone if any changes need to be made immediately.  Otherwise, you will receive a letter about your results with an explanation, but please check with MyChart first.  Please remember to sign up for MyChart if you have not done so, as this will be important to you in the future with finding out test results, communicating by private email, and scheduling acute appointments online when needed.

## 2017-02-28 NOTE — Assessment & Plan Note (Signed)
Mild elevated, likely situational, o/w stable overall by history and exam, recent data reviewed with pt, and pt to continue medical treatment as before,  to f/u any worsening symptoms or concerns BP Readings from Last 3 Encounters:  02/28/17 (!) 146/90  02/07/17 (!) 142/82  02/01/17 128/78

## 2017-02-28 NOTE — Assessment & Plan Note (Signed)
stable overall by history and exam, recent data reviewed with pt, and pt to continue medical treatment as before,  to f/u any worsening symptoms or concerns Lab Results  Component Value Date   HGBA1C 6.8 02/01/2017

## 2017-03-01 ENCOUNTER — Telehealth: Payer: Self-pay | Admitting: Internal Medicine

## 2017-03-01 DIAGNOSIS — M5412 Radiculopathy, cervical region: Secondary | ICD-10-CM

## 2017-03-01 NOTE — Telephone Encounter (Signed)
Copied from [place]. Topic: Quick Communication - See Telephone Encounter >> Mar 01, 2017  9:42 AM Collier, Randall Emperor wrote: CRM for notification. See Telephone encounter for: Pt would like a call back from [name] nurse, states he has some questions about his MRI.  03/01/17.

## 2017-03-01 NOTE — Telephone Encounter (Signed)
Ok, I will refer to orthopedic if the MRI cannot be done

## 2017-03-01 NOTE — Addendum Note (Signed)
Addended by: Biagio Borg on: 03/01/2017 12:33 PM   Modules accepted: Orders

## 2017-03-01 NOTE — Telephone Encounter (Signed)
Pt stated that MRI could not be done due to his pacemaker and would need patient to have done at the hospital. Informed [place]. She will speak with pt.

## 2017-03-02 ENCOUNTER — Ambulatory Visit: Payer: Medicare Other | Admitting: Family Medicine

## 2017-03-02 ENCOUNTER — Ambulatory Visit: Payer: Medicare Other | Admitting: Internal Medicine

## 2017-03-02 ENCOUNTER — Other Ambulatory Visit: Payer: Self-pay | Admitting: Internal Medicine

## 2017-03-02 DIAGNOSIS — M5412 Radiculopathy, cervical region: Secondary | ICD-10-CM

## 2017-03-07 ENCOUNTER — Ambulatory Visit (INDEPENDENT_AMBULATORY_CARE_PROVIDER_SITE_OTHER): Payer: Medicare Other | Admitting: *Deleted

## 2017-03-07 ENCOUNTER — Other Ambulatory Visit (HOSPITAL_COMMUNITY)
Admission: RE | Admit: 2017-03-07 | Discharge: 2017-03-07 | Disposition: A | Discharge status: Home / Self Care | Payer: Medicare Other | Source: Ambulatory Visit | Attending: Cardiology | Admitting: Cardiology

## 2017-03-07 ENCOUNTER — Ambulatory Visit: Payer: Medicare Other | Admitting: Urology

## 2017-03-07 DIAGNOSIS — I483 Typical atrial flutter: Secondary | ICD-10-CM | POA: Insufficient documentation

## 2017-03-07 DIAGNOSIS — I4891 Unspecified atrial fibrillation: Secondary | ICD-10-CM | POA: Diagnosis not present

## 2017-03-07 DIAGNOSIS — N401 Enlarged prostate with lower urinary tract symptoms: Secondary | ICD-10-CM | POA: Diagnosis not present

## 2017-03-07 DIAGNOSIS — N5201 Erectile dysfunction due to arterial insufficiency: Secondary | ICD-10-CM | POA: Diagnosis not present

## 2017-03-07 DIAGNOSIS — R3915 Urgency of urination: Secondary | ICD-10-CM | POA: Diagnosis not present

## 2017-03-07 DIAGNOSIS — Z7901 Long term (current) use of anticoagulants: Secondary | ICD-10-CM | POA: Diagnosis not present

## 2017-03-07 LAB — PROTIME-INR
INR: 6.35
Prothrombin Time: 55.5 seconds — ABNORMAL HIGH (ref 11.4–15.2)

## 2017-03-07 LAB — POCT INR: INR: >8

## 2017-03-07 NOTE — Patient Instructions (Signed)
POC INR >8.0   Sent to [facility] lab for venipuncture  INR 6.35 Hold coumadin x 3 days (Wed,Thurs,Fri)  Take 2.5mg  on Saturday and Sunday and recheck INR on Monday 03/12/17 Is on prednisone for neck pain.  Had medrol shot Bleeding and fall precautions discussed with pt and he verbalized understanding.

## 2017-03-09 ENCOUNTER — Ambulatory Visit (INDEPENDENT_AMBULATORY_CARE_PROVIDER_SITE_OTHER): Payer: Medicare Other | Admitting: Internal Medicine

## 2017-03-09 ENCOUNTER — Encounter: Payer: Self-pay | Admitting: Internal Medicine

## 2017-03-09 VITALS — BP 134/86 | HR 83 | Temp 97.9°F | Ht 71.0 in | Wt 221.0 lb

## 2017-03-09 DIAGNOSIS — M5412 Radiculopathy, cervical region: Secondary | ICD-10-CM | POA: Diagnosis not present

## 2017-03-09 DIAGNOSIS — I10 Essential (primary) hypertension: Secondary | ICD-10-CM | POA: Diagnosis not present

## 2017-03-09 DIAGNOSIS — R062 Wheezing: Secondary | ICD-10-CM

## 2017-03-09 MED ORDER — HYDROCODONE-ACETAMINOPHEN 10-325 MG PO TABS: 1.0000 | ORAL_TABLET | Freq: Every day | ORAL | 0 refills | Status: DC | PRN

## 2017-03-09 NOTE — Patient Instructions (Addendum)
Please continue all other medications as before, and refills have been done if requested.  Please have the pharmacy call with any other refills you may need.  Please continue your efforts at being more active, low cholesterol diet, and weight control.  You are otherwise up to date with prevention measures today.  Please keep your appointments with your specialists as you may have planned  Please return in 6 months, or sooner if needed 

## 2017-03-09 NOTE — Progress Notes (Signed)
Subjective:    Patient ID: [name], male    DOB: July 24, 1945, 72 y.o.   MRN: [id number]  HPI  Here to f/u; overall doing ok,  Pt denies chest pain, increasing sob or doe, wheezing, orthopnea, PND, increased LE swelling, palpitations, dizziness or syncope.  Pt denies new neurological symptoms such as new headache, or facial or extremity weakness or numbness.  Pt denies polydipsia, polyuria, or low sugar episode.  Pt states overall good compliance with meds, but did not take the symbicort due to cost.  Does not want change to dulera or advair at this time as "we'll just see how it goes."  Also, still with recurring left neck pain/arm pain and LUE weakness, already has gabapentin and tramadol, but occas severe pain, to start PT soon and has appt f/u with ortho per pt.   Past Medical History:  Diagnosis Date  . Aortic root dilatation ([place]) 02/02/2011  . Arthritis    "lower back; going back down both my sciatic nerves"  . 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 BENIGN PROSTATIC HYPERTROPHY 08/29/2006   takes Flomax daily  . 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 Cataracts, bilateral    immature  . CHF (congestive heart failure) (HCC)    takes Lasix daily  . Chronic back pain    HNP   . CORONARY ARTERY DISEASE 08/29/2006   takes Coumadin daily  . DEPRESSION 08/29/2006  . DIABETES MELLITUS, TYPE II 08/29/2006   takes Metformin,Januvia,and Glipizide  daily  . DIASTOLIC HEART FAILURE, CHRONIC 06/09/2008  . GOUT 04/22/2007   takes Allopurinol daily  . History of migraine    59yr ago  . HYPERLIPIDEMIA 08/29/2006   takes Atorvastatin daily  . HYPERTENSION 08/29/2006   takes Lisinopril daily  . INSOMNIA-SLEEP DISORDER-UNSPEC 10/23/2007  . Left lumbar radiculopathy 05/30/2010  . LUMBAR RADICULOPATHY, RIGHT 06/10/2007  . Muscle spasm    takes Zanaflex daily  . Myocardial infarction ([facility] 12/27/10   "I've had several MIs"  . 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 PACEMAKER,  PERMANENT 03/04/2008   pt denies this date  . Peripheral neuropathy    takes Gabapentin daily  . Presence of permanent cardiac pacemaker   . Shortness of breath dyspnea    "all my life" with exertion  . Sleep apnea    "if I lay flat I quit breathing; HOB up I'm fine"   Past Surgical History:  Procedure Laterality Date  . COLONOSCOPY    . CORONARY ANGIOPLASTY WITH STENT PLACEMENT  12/27/10   "I've had a total of 9 cardiac stents put in"  . CORONARY ARTERY BYPASS GRAFT  1992   CABG X 2  . ESOPHAGOGASTRODUODENOSCOPY N/A 12/01/2013   Procedure: ESOPHAGOGASTRODUODENOSCOPY (EGD);  Surgeon: DLafayette Dragon MD;  [facility];  Service: Endoscopy;  Laterality: N/A;  . INSERT / REPLACE / REMOVE PACEMAKER  ~ 2004   initial pacemaker placement  . INSERT / REPLACE / REMOVE PACEMAKER  10/2009   generator change  . LUMBAR LAMINECTOMY/DECOMPRESSION MICRODISCECTOMY Right 03/23/2014   Procedure: LAMINECTOMY AND FORAMINOTOMY RIGHT LUMBAR THREE-FOUR,LUMBAR FOUR-FIVE, LUMBAR FIVE-SACRAL ONE;  Surgeon: HCharlie Pitter MD;  [facility];  Service: Neurosurgery;  Laterality: Right;  right  . LUMBAR LAMINECTOMY/DECOMPRESSION MICRODISCECTOMY Left 12/01/2014   Procedure: Left Lumbar Three-Four, Lumbar Four-Five Laminectomy and Foraminotomy;  Surgeon: HEarnie Larsson MD;  [facility];  Service: Neurosurgery;  Laterality: Left;  . s/p left arm surgury after work accident  1991   "  2000# steel fell on it"  . s/p right hand surgury for foreign object  1970's   "piece of wood went in my hand; had to get that out"    reports that he quit smoking about 44 years ago. His smoking use included cigarettes. He has a 27.00 pack-year smoking history. He has quit using smokeless tobacco. He reports that he does not drink alcohol or use drugs. family history includes Coronary artery disease (age of onset: 22) in his other; Diabetes in his mother, other, and sister; Heart disease in his sister and sister;  Lung cancer in his sister. Allergies  Allergen Reactions  . Crestor [Rosuvastatin Calcium] Other (See Comments)    Urination of blood  . Latex Rash and Other (See Comments)    Gloves cause welts on hands!!   Current Outpatient Medications on File Prior to Visit  Medication Sig Dispense Refill  . albuterol (PROVENTIL HFA;VENTOLIN HFA) 108 (90 Base) MCG/ACT inhaler Inhale 2 puffs into the lungs every 6 (six) hours as needed for wheezing or shortness of breath. 1 Inhaler 2  . allopurinol (ZYLOPRIM) 300 MG tablet take 1 tablet by mouth once daily (Patient taking differently: Take 300 mg by mouth daily. ) 90 tablet 3  . atorvastatin (LIPITOR) 80 MG tablet TAKE 1 TABLET BY MOUTH  DAILY 90 tablet 3  . Blood Glucose Monitoring Suppl (ONE TOUCH ULTRA 2) w/Device KIT Use as directed 1 each 3  . carvedilol (COREG) 25 MG tablet TAKE 1 TABLET BY MOUTH TWO  TIMES DAILY 180 tablet 3  . Cholecalciferol (VITAMIN D) 1000 UNITS capsule Take 1,000 Units by mouth 2 (two) times daily. Reported on 02/15/2015    . donepezil (ARICEPT) 5 MG tablet TAKE 1 TABLET BY MOUTH AT  BEDTIME 90 tablet 1  . fluocinonide cream (LIDEX) 9.48 % Apply 1 application topically 2 (two) times daily. 30 g 2  . fluticasone (FLONASE) 50 MCG/ACT nasal spray USE 2 SPRAYS IN EACH  NOSTRIL EVERY DAY 48 g 0  . furosemide (LASIX) 40 MG tablet TAKE 1 TABLET BY MOUTH  DAILY 90 tablet 2  . gabapentin (NEURONTIN) 300 MG capsule TAKE 2 CAPSULES BY MOUTH 3  TIMES DAILY (Patient taking differently: TAKE 1 CAPSULE BY MOUTH 3  TIMES DAILY) 540 capsule 3  . glipiZIDE (GLUCOTROL XL) 10 MG 24 hr tablet TAKE 1 TABLET BY MOUTH  DAILY WITH BREAKFAST 90 tablet 3  . HYDROcodone-homatropine (HYCODAN) 5-1.5 MG/5ML syrup Take 5 mLs by mouth every 6 (six) hours as needed for up to 10 days for cough. 180 mL 0  . levofloxacin (LEVAQUIN) 500 MG tablet Take 1 tablet (500 mg total) by mouth daily for 10 days. 10 tablet 0  . lisinopril (PRINIVIL,ZESTRIL) 2.5 MG tablet TAKE  1 TABLET BY MOUTH  DAILY 90 tablet 0  . metFORMIN (GLUCOPHAGE) 500 MG tablet TAKE 2 TABLETS BY MOUTH TWO TIMES DAILY 360 tablet 3  . methocarbamol (ROBAXIN) 500 MG tablet Take 1 tablet (500 mg total) by mouth every 8 (eight) hours as needed for muscle spasms. 30 tablet 0  . Multiple Vitamin (MULTIVITAMIN WITH MINERALS) TABS tablet Take 1 tablet by mouth every morning.    . ONE TOUCH ULTRA TEST test strip CHECK BLOOD SUGAR TWO TIMES DAILY 200 each 1  . ONETOUCH DELICA LANCETS 54O MISC USE AS DIRECTED THREE TIMES DAILY TO CHECK BLOOD SUGAR 300 each 0  . oxybutynin (DITROPAN XL) 10 MG 24 hr tablet Take 1 tablet (10 mg total) by mouth  at bedtime. 90 tablet 3  . potassium chloride SA (K-DUR,KLOR-CON) 20 MEQ tablet TAKE 1 TABLET BY MOUTH  EVERY EVENING 90 tablet 3  . predniSONE (DELTASONE) 10 MG tablet 3 tabs by mouth per day for 3 days,2tabs per day for 3 days,1tab per day for 3 days 18 tablet 0  . sitaGLIPtin (JANUVIA) 100 MG tablet Take 1 tablet (100 mg total) by mouth daily. 90 tablet 3  . tamsulosin (FLOMAX) 0.4 MG CAPS capsule TAKE 1 CAPSULE BY MOUTH  EVERY 12 HOURS 180 capsule 3  . tiZANidine (ZANAFLEX) 4 MG tablet TAKE 1 TABLET BY MOUTH  EVERY 6 HOURS AS NEEDED FOR MUSCLE SPASMS. 180 tablet 0  . traMADol (ULTRAM) 50 MG tablet Take 1 tablet (50 mg total) by mouth every 8 (eight) hours as needed. 60 tablet 0  . warfarin (COUMADIN) 5 MG tablet TAKE 1 TABLET BY MOUTH DAILY OR AS DIRECTED BY COUMADIN CLINIC 90 tablet 1   No current facility-administered medications on file prior to visit.    Review of Systems  Constitutional: Negative for other unusual diaphoresis or sweats HENT: Negative for ear discharge or swelling Eyes: Negative for other worsening visual disturbances Respiratory: Negative for stridor or other swelling  Gastrointestinal: Negative for worsening distension or other blood Genitourinary: Negative for retention or other urinary change Musculoskeletal: Negative for other MSK pain  or swelling Skin: Negative for color change or other new lesions Neurological: Negative for worsening tremors and other numbness  Psychiatric/Behavioral: Negative for worsening agitation or other fatigue All other system neg per pt    Objective:   Physical Exam BP 134/86   Pulse 83   Temp 97.9 F (36.6 C) (Oral)   Ht _0  (1.803 m)   Wt 221 lb (100.2 kg)   SpO2 94%   BMI 30.82 kg/m  VS noted,  Constitutional: Pt appears in NAD HENT: Head: NCAT.  Right Ear: External ear normal.  Left Ear: External ear normal.  Eyes: . Pupils are equal, round, and reactive to light. Conjunctivae and EOM are normal Nose: without d/c or deformity Neck: Neck supple. Gross normal ROM Cardiovascular: Normal rate and regular rhythm.   Pulmonary/Chest: Effort normal and breath sounds without rales or wheezing.  Neurological: Pt is alert. At baseline orientation, motor grossly intact except for LUE 4+/5 weakness Skin: Skin is warm. No rashes, other new lesions, no LE edema Psychiatric: Pt behavior is normal without agitation  No other exam findings    Assessment & Plan:

## 2017-03-09 NOTE — Assessment & Plan Note (Signed)
Mod to severe persistent, for pain control, cont PT and f/u ortho

## 2017-03-09 NOTE — Assessment & Plan Note (Signed)
Resolved, pt wants to cont same tx, declines steroid inhaler,  to f/u any worsening symptoms or concerns

## 2017-03-09 NOTE — Assessment & Plan Note (Signed)
stable overall by history and exam, recent data reviewed with pt, and pt to continue medical treatment as before,  to f/u any worsening symptoms or concerns BP Readings from Last 3 Encounters:  03/09/17 134/86  02/28/17 (!) 146/90  02/07/17 (!) 142/82

## 2017-03-12 ENCOUNTER — Ambulatory Visit (INDEPENDENT_AMBULATORY_CARE_PROVIDER_SITE_OTHER): Payer: Medicare Other | Admitting: *Deleted

## 2017-03-12 DIAGNOSIS — Z5181 Encounter for therapeutic drug level monitoring: Secondary | ICD-10-CM | POA: Diagnosis not present

## 2017-03-12 DIAGNOSIS — I483 Typical atrial flutter: Secondary | ICD-10-CM | POA: Diagnosis not present

## 2017-03-12 DIAGNOSIS — I4891 Unspecified atrial fibrillation: Secondary | ICD-10-CM

## 2017-03-12 DIAGNOSIS — Z7901 Long term (current) use of anticoagulants: Secondary | ICD-10-CM

## 2017-03-12 LAB — POCT INR: INR: 1.2

## 2017-03-12 NOTE — Patient Instructions (Signed)
Pt was off coumadin x 3 days due to elevated INR. Take 1 tablet x 3 days then resume 1/2 tablet daily except 1 tablet Mondays, Wednesdays and Fridays

## 2017-03-13 ENCOUNTER — Other Ambulatory Visit: Payer: Self-pay

## 2017-03-13 ENCOUNTER — Ambulatory Visit (HOSPITAL_COMMUNITY): Payer: Medicare Other | Attending: Internal Medicine

## 2017-03-13 ENCOUNTER — Encounter (HOSPITAL_COMMUNITY): Payer: Self-pay

## 2017-03-13 DIAGNOSIS — M436 Torticollis: Secondary | ICD-10-CM | POA: Diagnosis not present

## 2017-03-13 DIAGNOSIS — M6281 Muscle weakness (generalized): Secondary | ICD-10-CM | POA: Insufficient documentation

## 2017-03-13 DIAGNOSIS — R200 Anesthesia of skin: Secondary | ICD-10-CM

## 2017-03-13 DIAGNOSIS — R202 Paresthesia of skin: Secondary | ICD-10-CM | POA: Insufficient documentation

## 2017-03-13 DIAGNOSIS — M542 Cervicalgia: Secondary | ICD-10-CM | POA: Insufficient documentation
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
## 2017-03-13 NOTE — Patient Instructions (Signed)
    Shoulder Retractions: 10 repetitions 1-2 times per day  Draw your shoulder blades back and down.

## 2017-03-15 ENCOUNTER — Ambulatory Visit: Payer: Self-pay

## 2017-03-15 NOTE — Telephone Encounter (Signed)
Patient called with c/o "leg swelling." He says "they are so swollen if you stick a pin in them they will pop. One of my legs is always swollen because of surgery I had, but now both are swollen. I noticed it last night when I took my socks off that they were swollen more than they were a few days ago. I can't wear my shoes, just bedroom shoes, since about day before yesterday." I asked are they red, warm to touch, he says "no, normal colored." I asked are they painful to touch or is he having any pain, he says "not painful to touch and I always have some pain, but no more than usual." I asked about his breathing and chest pain, he says "no chest pain. I always have difficulty breathing, but it is no worse than what it normally is." According to protocol, see PCP within 24 hours, appointment made for tomorrow with Jodi Mourning, NP, care advice given, patient verbalized understanding.   Reason for Disposition . [1] MODERATE leg swelling (e.g., swelling extends up to knees) AND [2] new onset or worsening  Answer Assessment - Initial Assessment Questions 1. ONSET: "When did the swelling start?" (e.g., minutes, hours, days)     Just noticed it last night 2. LOCATION: "What part of the leg is swollen?"  "Are both legs swollen or just one leg?"     Both legs from knee down and both feet; unable to wear shoes since day before yesterday 3. SEVERITY: "How bad is the swelling?" (e.g., localized; mild, moderate, severe)  - Localized - small area of swelling localized to one leg  - MILD pedal edema - swelling limited to foot and ankle, pitting edema < 1/4 inch (6 mm) deep, rest and elevation eliminate most or all swelling  - MODERATE edema - swelling of lower leg to knee, pitting edema > 1/4 inch (6 mm) deep, rest and elevation only partially reduce swelling  - SEVERE edema - swelling extends above knee, facial or hand swelling present      Moderate 4. REDNESS: "Does the swelling look red or infected?"      No 5. PAIN: "Is the swelling painful to touch?" If so, ask: "How painful is it?"   (Scale 1-10; mild, moderate or severe)     Denies pain 6. FEVER: "Do you have a fever?" If so, ask: "What is it, how was it measured, and when did it start?"      No 7. CAUSE: "What do you think is causing the leg swelling?"     Unknown 8. MEDICAL HISTORY: "Do you have a history of heart failure, kidney disease, liver failure, or cancer?"     Yes 9. RECURRENT SYMPTOM: "Have you had leg swelling before?" If so, ask: "When was the last time?" "What happened that time?"     Yes-had to be put in hospital 5-6 years or longer 10. OTHER SYMPTOMS: "Do you have any other symptoms?" (e.g., chest pain, difficulty breathing)       No more than usual for difficulty breathing, no chest pain 11. PREGNANCY: "Is there any chance you are pregnant?" "When was your last menstrual period?"       N/A  Protocols used: LEG SWELLING AND EDEMA-A-AH

## 2017-03-16 ENCOUNTER — Ambulatory Visit (INDEPENDENT_AMBULATORY_CARE_PROVIDER_SITE_OTHER): Payer: Medicare Other | Admitting: Family

## 2017-03-16 ENCOUNTER — Other Ambulatory Visit (INDEPENDENT_AMBULATORY_CARE_PROVIDER_SITE_OTHER): Payer: Medicare Other

## 2017-03-16 ENCOUNTER — Inpatient Hospital Stay (HOSPITAL_COMMUNITY): Admission: RE | Admit: 2017-03-16 | Payer: Medicare Other | Source: Ambulatory Visit

## 2017-03-16 ENCOUNTER — Ambulatory Visit (HOSPITAL_COMMUNITY)
Admission: RE | Admit: 2017-03-16 | Discharge: 2017-03-16 | Disposition: A | Discharge status: Home / Self Care | Payer: Medicare Other | Source: Ambulatory Visit | Attending: Family | Admitting: Family

## 2017-03-16 ENCOUNTER — Ambulatory Visit (INDEPENDENT_AMBULATORY_CARE_PROVIDER_SITE_OTHER)
Admission: RE | Admit: 2017-03-16 | Discharge: 2017-03-16 | Disposition: A | Discharge status: Home / Self Care | Payer: Medicare Other | Source: Ambulatory Visit | Attending: Family | Admitting: Family

## 2017-03-16 VITALS — BP 130/78 | HR 81 | Temp 97.9°F | Ht 71.0 in | Wt 222.1 lb

## 2017-03-16 DIAGNOSIS — R600 Localized edema: Secondary | ICD-10-CM

## 2017-03-16 DIAGNOSIS — J811 Chronic pulmonary edema: Secondary | ICD-10-CM | POA: Diagnosis not present

## 2017-03-16 LAB — COMPREHENSIVE METABOLIC PANEL
ALT: 30 U/L (ref 0–53)
AST: 25 U/L (ref 0–37)
Albumin: 3.9 g/dL (ref 3.5–5.2)
Alkaline Phosphatase: 82 U/L (ref 39–117)
BUN: 18 mg/dL (ref 6–23)
CALCIUM: 8.7 mg/dL (ref 8.4–10.5)
CHLORIDE: 106 meq/L (ref 96–112)
CO2: 30 meq/L (ref 19–32)
CREATININE: 0.96 mg/dL (ref 0.40–1.50)
GFR: 82.05 mL/min (ref >60.00)
Glucose, Bld: 186 mg/dL — ABNORMAL HIGH (ref 70–99)
Potassium: 4.5 mEq/L (ref 3.5–5.1)
Sodium: 143 mEq/L (ref 135–145)
Total Bilirubin: 0.4 mg/dL (ref 0.2–1.2)
Total Protein: 6.1 g/dL (ref 6.0–8.3)

## 2017-03-16 LAB — BRAIN NATRIURETIC PEPTIDE: Pro B Natriuretic peptide (BNP): 164 pg/mL — ABNORMAL HIGH (ref 0.0–100.0)

## 2017-03-16 NOTE — Progress Notes (Signed)
No acute changes seen; he is being sent for STAT doppler to rule out DVT; follow-up to be determined. No CHF seen on CXR today;

## 2017-03-16 NOTE — Progress Notes (Signed)
[name] is a 72 y.o. male with the following history as recorded in EpicCare:  Patient Active Problem List   Diagnosis Date Noted  . Left cervical radiculopathy 02/07/2017  . Trochanteric bursitis, right hip 01/01/2017  . Right hip pain 12/26/2016  . Knee instability, right 11/03/2016  . Urinary frequency 10/20/2016  . Right foot pain 09/29/2016  . Prepatellar bursitis 06/29/2016  . Left knee pain 06/28/2016  . Ingrown nail 06/28/2016  . Acute gouty arthritis 05/07/2016  . Left shoulder pain 02/09/2016  . Memory loss 01/19/2016  . Ingrown toenail 09/13/2015  . Renal insufficiency 09/08/2015  . Right leg swelling 07/13/2015  . Leg wound, right 06/15/2015  . Rash 06/06/2015  . Anemia 05/24/2015  . Cellulitis of leg, right 05/13/2015  . Greater trochanteric bursitis of left hip 08/12/2014  . Left leg pain 07/15/2014  . 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 Lumbar stenosis with neurogenic claudication 03/23/2014  . Long-term (current) use of anticoagulants 02/27/2014  . Dysphagia, pharyngoesophageal phase 12/01/2013  . 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 Dysphagia 11/11/2013  . Lumbago 11/03/2013  . Low back pain radiating to right leg 11/03/2013  . Abnormality of gait 11/03/2013  . Difficulty walking 11/03/2013  . Loss of weight 10/29/2013  . Atrial fibrillation ([place]) 08/26/2013  . Chest pain 04/05/2013  . Plantar fasciitis of right foot 02/18/2013  . Bruit 08/21/2012  . Sciatica of left side 08/15/2012  . Left hip pain 08/15/2012  . Headache(784.0) 05/03/2012  . Increased prostate specific antigen (PSA) velocity 11/11/2011  . Right sided sciatica 11/10/2011  . Chronic low back pain 05/03/2011  . Aortic root dilatation ([place]) 02/02/2011  . Wheezing without diagnosis of asthma 02/02/2011  . Syncope 12/27/2010  . Left lumbar radiculopathy 05/30/2010  . Preventative health care 05/30/2010  . Atrioventricular  block, complete ([place]) 09/04/2008  . DIASTOLIC HEART FAILURE, CHRONIC 06/09/2008  . KNEE PAIN, BILATERAL 04/21/2008  . LEG PAIN, BILATERAL 04/21/2008  . 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 PACEMAKER, PERMANENT 03/04/2008  . INSOMNIA-SLEEP DISORDER-UNSPEC 10/23/2007  . Lumbar radiculopathy, chronic 06/10/2007  . GOUT 04/22/2007  . 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 ERECTILE DYSFUNCTION 11/26/2006  . ALLERGIC RHINITIS 11/26/2006  . Type 2 diabetes, uncontrolled, with neuropathy ([place]) 08/29/2006  . Hyperlipidemia 08/29/2006  . Overweight(278.02) 08/29/2006  . DEPRESSION 08/29/2006  . Essential hypertension 08/29/2006  . CORONARY ARTERY DISEASE 08/29/2006  . BENIGN PROSTATIC HYPERTROPHY 08/29/2006    Current Outpatient Medications  Medication Sig Dispense Refill  . albuterol (PROVENTIL HFA;VENTOLIN HFA) 108 (90 Base) MCG/ACT inhaler Inhale 2 puffs into the lungs every 6 (six) hours as needed for wheezing or shortness of breath. 1 Inhaler 2  . allopurinol (ZYLOPRIM) 300 MG tablet take 1 tablet by mouth once daily (Patient taking differently: Take 300 mg by mouth daily. ) 90 tablet 3  . atorvastatin (LIPITOR) 80 MG tablet TAKE 1 TABLET BY MOUTH  DAILY 90 tablet 3  . Blood Glucose Monitoring Suppl (ONE TOUCH ULTRA 2) w/Device KIT Use as directed 1 each 3  . carvedilol (COREG) 25 MG tablet TAKE 1 TABLET BY MOUTH TWO  TIMES DAILY 180 tablet 3  . Cholecalciferol (VITAMIN D) 1000 UNITS capsule Take 1,000 Units by mouth 2 (two) times daily. Reported on 02/15/2015    . donepezil (ARICEPT) 5 MG tablet TAKE 1 TABLET BY MOUTH AT  BEDTIME 90 tablet 1  . fluocinonide cream (LIDEX) 7.41 % Apply 1 application topically 2 (two)  times daily. 30 g 2  . fluticasone (FLONASE) 50 MCG/ACT nasal spray USE 2 SPRAYS IN EACH  NOSTRIL EVERY DAY 48 g 0  . furosemide (LASIX) 40 MG tablet TAKE 1 TABLET BY MOUTH  DAILY 90 tablet 2  . gabapentin (NEURONTIN) 300 MG capsule TAKE 2 CAPSULES BY MOUTH 3  TIMES  DAILY (Patient taking differently: TAKE 1 CAPSULE BY MOUTH 3  TIMES DAILY) 540 capsule 3  . glipiZIDE (GLUCOTROL XL) 10 MG 24 hr tablet TAKE 1 TABLET BY MOUTH  DAILY WITH BREAKFAST 90 tablet 3  . HYDROcodone-acetaminophen (NORCO) 10-325 MG tablet Take 1 tablet by mouth daily as needed. 30 tablet 0  . lisinopril (PRINIVIL,ZESTRIL) 2.5 MG tablet TAKE 1 TABLET BY MOUTH  DAILY 90 tablet 0  . metFORMIN (GLUCOPHAGE) 500 MG tablet TAKE 2 TABLETS BY MOUTH TWO TIMES DAILY 360 tablet 3  . methocarbamol (ROBAXIN) 500 MG tablet Take 1 tablet (500 mg total) by mouth every 8 (eight) hours as needed for muscle spasms. 30 tablet 0  . Multiple Vitamin (MULTIVITAMIN WITH MINERALS) TABS tablet Take 1 tablet by mouth every morning.    . ONE TOUCH ULTRA TEST test strip CHECK BLOOD SUGAR TWO TIMES DAILY 200 each 1  . ONETOUCH DELICA LANCETS 73U MISC USE AS DIRECTED THREE TIMES DAILY TO CHECK BLOOD SUGAR 300 each 0  . oxybutynin (DITROPAN XL) 10 MG 24 hr tablet Take 1 tablet (10 mg total) by mouth at bedtime. 90 tablet 3  . potassium chloride SA (K-DUR,KLOR-CON) 20 MEQ tablet TAKE 1 TABLET BY MOUTH  EVERY EVENING 90 tablet 3  . predniSONE (DELTASONE) 10 MG tablet 3 tabs by mouth per day for 3 days,2tabs per day for 3 days,1tab per day for 3 days 18 tablet 0  . sitaGLIPtin (JANUVIA) 100 MG tablet Take 1 tablet (100 mg total) by mouth daily. 90 tablet 3  . tamsulosin (FLOMAX) 0.4 MG CAPS capsule TAKE 1 CAPSULE BY MOUTH  EVERY 12 HOURS 180 capsule 3  . tiZANidine (ZANAFLEX) 4 MG tablet TAKE 1 TABLET BY MOUTH  EVERY 6 HOURS AS NEEDED FOR MUSCLE SPASMS. 180 tablet 0  . traMADol (ULTRAM) 50 MG tablet Take 1 tablet (50 mg total) by mouth every 8 (eight) hours as needed. 60 tablet 0  . warfarin (COUMADIN) 5 MG tablet TAKE 1 TABLET BY MOUTH DAILY OR AS DIRECTED BY COUMADIN CLINIC 90 tablet 1   No current facility-administered medications for this visit.     Allergies: Crestor [rosuvastatin calcium] and Latex  Past Medical  History:  Diagnosis Date  . Aortic root dilatation ([place]) 02/02/2011  . Arthritis    "lower back; going back down both my sciatic nerves"  . Atrioventricular block, complete ([place]) 09/04/2008  . BENIGN PROSTATIC HYPERTROPHY 08/29/2006   takes Flomax daily  . 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 Cataracts, bilateral    immature  . CHF (congestive heart failure) ([facility])    takes Lasix daily  . Chronic back pain    HNP   . CORONARY ARTERY DISEASE 08/29/2006   takes Coumadin daily  . DEPRESSION 08/29/2006  . DIABETES MELLITUS, TYPE II 08/29/2006   takes Metformin,Januvia,and Glipizide  daily  . DIASTOLIC HEART FAILURE, CHRONIC 06/09/2008  . GOUT 04/22/2007   takes Allopurinol daily  . History of migraine    34yr ago  . HYPERLIPIDEMIA 08/29/2006   takes Atorvastatin daily  . HYPERTENSION 08/29/2006   takes Lisinopril daily  . INSOMNIA-SLEEP DISORDER-UNSPEC 10/23/2007  . Left lumbar radiculopathy  05/30/2010  . LUMBAR RADICULOPATHY, RIGHT 06/10/2007  . Muscle spasm    takes Zanaflex daily  . Myocardial infarction ([place]) 12/27/10   "I've had several MIs"  . 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 PACEMAKER, PERMANENT 03/04/2008   pt denies this date  . Peripheral neuropathy    takes Gabapentin daily  . Presence of permanent cardiac pacemaker   . Shortness of breath dyspnea    "all my life" with exertion  . Sleep apnea    "if I lay flat I quit breathing; HOB up I'm fine"    Past Surgical History:  Procedure Laterality Date  . COLONOSCOPY    . CORONARY ANGIOPLASTY WITH STENT PLACEMENT  12/27/10   "I've had a total of 9 cardiac stents put in"  . CORONARY ARTERY BYPASS GRAFT  1992   CABG X 2  . ESOPHAGOGASTRODUODENOSCOPY N/A 12/01/2013   Procedure: ESOPHAGOGASTRODUODENOSCOPY (EGD);  Surgeon: Lafayette Dragon, MD;  Location: [facility];  Service: Endoscopy;  Laterality: N/A;  . INSERT / REPLACE / REMOVE PACEMAKER  ~ 2004   initial pacemaker placement  . INSERT / REPLACE / REMOVE  PACEMAKER  10/2009   generator change  . LUMBAR LAMINECTOMY/DECOMPRESSION MICRODISCECTOMY Right 03/23/2014   Procedure: LAMINECTOMY AND FORAMINOTOMY RIGHT LUMBAR THREE-FOUR,LUMBAR FOUR-FIVE, LUMBAR FIVE-SACRAL ONE;  Surgeon: Charlie Pitter, MD;  Location: [facility];  Service: Neurosurgery;  Laterality: Right;  right  . LUMBAR LAMINECTOMY/DECOMPRESSION MICRODISCECTOMY Left 12/01/2014   Procedure: Left Lumbar Three-Four, Lumbar Four-Five Laminectomy and Foraminotomy;  Surgeon: Earnie Larsson, MD;  Location: [facility];  Service: Neurosurgery;  Laterality: Left;  . s/p left arm surgury after work accident  1991   "2000# steel fell on it"  . s/p right hand surgury for foreign object  1970's   "piece of wood went in my hand; had to get that out"    Family History  Problem Relation Age of Onset  . Diabetes Mother   . Diabetes Sister   . Heart disease Sister        2 sister died with heart disease  . Coronary artery disease Other 70       male, first degree relative  . Diabetes Other        1st degree relative  . Heart disease Sister   . Lung cancer Sister        deceased    Social History   Tobacco Use  . Smoking status: Former Smoker    Packs/day: 3.00    Years: 9.00    Pack years: 27.00    Types: Cigarettes    Last attempt to quit: 02/26/1973    Years since quitting: 44.0  . Smokeless tobacco: Former Systems developer  . Tobacco comment: quit smoking 17yr ago  Substance Use Topics  . Alcohol use: No    Alcohol/week: 0.0 oz    Subjective:  Patient presents with concerns for worsening swelling in his lower legs; notes that his right leg is always swollen due to history of vascular surgery; very concerned about the swelling in his left leg- notes this is unusual for him; states that swelling was so bad earlier this week, he could not get his shoes on; denies any redness or pain localized in the left calf; notes that he could not see his toes in the past 2 days due to the amount of swelling. Does  have documented history of heart failure- takes Lasix 40 mg everyday; has been told he can take an extra 40 mg  every other day when swelling becomes problematic; he notes he has been doing this and gotten good relief; however, he can't tolerate the increased urinary symptoms on this regimen; of note, he does feel the swelling "looks much better today."   Objective:  Vitals:   03/16/17 0852  BP: 130/78  Pulse: 81  Temp: 97.9 F (36.6 C)  TempSrc: Oral  SpO2: 97%  Weight: 222 lb 1.9 oz (100.8 kg)  Height: '5\' 11"'  (1.803 m)    General: Well developed, well nourished, in no acute distress  Skin : Warm and dry.  Head: Normocephalic and atraumatic  Eyes: Sclera and conjunctiva clear; pupils round and reactive to light; extraocular movements intact  Ears: External normal; canals clear; tympanic membranes normal  Oropharynx: Pink, supple. No suspicious lesions  Neck: Supple without thyromegaly, adenopathy  Lungs: Respirations unlabored; clear to auscultation bilaterally without wheeze, rales, rhonchi  CVS exam: normal rate and regular rhythm.  Musculoskeletal: No deformities; no active joint inflammation  Extremities: bilateral pitting edema 2+ , no cyanosis, clubbing  Vessels: Symmetric bilaterally  Neurologic: Alert and oriented; speech intact; face symmetrical; moves all extremities well; CNII-XII intact without focal deficit  Assessment:  1. Pedal edema     Plan:  Check STAT X-ray- no changes c/w CHF seen; update CBC, CMP, BNP today; check venous doppler of left leg- negative for DVT; recommended to try taking 20 mg of Lasix extra for the swelling ( as opposed to 40 mg every other day) and will refer back to cardiology; patient expresses understanding.   No Follow-up on file.  Orders Placed This Encounter  Procedures  . DG Chest 2 View    Standing Status:   Future    Number of Occurrences:   1    Standing Expiration Date:   05/15/2018    Order Specific Question:   Reason for Exam  (SYMPTOM  OR DIAGNOSIS REQUIRED)    Answer:   pedal edema    Order Specific Question:   Preferred imaging location?    Answer:   [facility]    Order Specific Question:   Radiology Contrast Protocol - do NOT remove file path    Answer:   [email or web address]  . US Venous Img Lower Unilateral Left    Standing Status:   Future    Number of Occurrences:   1    Standing Expiration Date:   05/15/2018    Order Specific Question:   Reason for Exam (SYMPTOM  OR DIAGNOSIS REQUIRED)    Answer:   left lower extremity swelling    Order Specific Question:   Preferred imaging location?    Answer:   [facility]    Order Specific Question:   Call Results- Best Contact Number?    Answer:   [phone] not necessary to hold   . B Nat Peptide    Standing Status:   Future    Number of Occurrences:   1    Standing Expiration Date:   03/16/2018  . Comprehensive metabolic panel    Standing Status:   Future    Number of Occurrences:   1    Standing Expiration Date:   03/16/2018    Requested Prescriptions    No prescriptions requested or ordered in this encounter

## 2017-03-16 NOTE — Progress Notes (Signed)
Reviewed with patient in office; being sent for STAT Doppler of left lower extremity;

## 2017-03-19 ENCOUNTER — Telehealth (HOSPITAL_COMMUNITY): Payer: Self-pay

## 2017-03-19 ENCOUNTER — Encounter: Payer: Self-pay | Admitting: Family

## 2017-03-19 NOTE — Telephone Encounter (Signed)
He has a MD appointment on this date and can not be here

## 2017-03-19 NOTE — Telephone Encounter (Signed)
Pt states since his last PT his hands are worst and he dropped his cup of coffee, He states he can not stand that

## 2017-03-20 ENCOUNTER — Ambulatory Visit: Payer: Medicare Other | Admitting: Cardiology

## 2017-03-20 ENCOUNTER — Encounter (HOSPITAL_COMMUNITY): Payer: Medicare Other

## 2017-03-20 ENCOUNTER — Encounter: Payer: Self-pay | Admitting: Cardiology

## 2017-03-20 VITALS — BP 146/78 | HR 64 | Ht 71.0 in | Wt 255.0 lb

## 2017-03-20 DIAGNOSIS — I10 Essential (primary) hypertension: Secondary | ICD-10-CM

## 2017-03-20 DIAGNOSIS — I5032 Chronic diastolic (congestive) heart failure: Secondary | ICD-10-CM

## 2017-03-20 DIAGNOSIS — I38 Endocarditis, valve unspecified: Secondary | ICD-10-CM | POA: Diagnosis not present

## 2017-03-20 DIAGNOSIS — E7800 Pure hypercholesterolemia, unspecified: Secondary | ICD-10-CM

## 2017-03-20 MED ORDER — FUROSEMIDE 40 MG PO TABS: 120.0000 mg | ORAL_TABLET | Freq: Two times a day (BID) | ORAL | 3 refills | Status: DC

## 2017-03-20 NOTE — Patient Instructions (Signed)
Medication Instructions:   INCREASE FUROSEMIDE TO 120 MG TWICE DAILY= 3 OF THE 40 MG TABLETS TWICE DAILY  Labwork:  Your physician recommends that you return for lab work in: [place]  Testing/Procedures:  Your physician has requested that you have an echocardiogram. Echocardiography is a painless test that uses sound waves to create images of your heart. It provides your doctor with information about the size and shape of your heart and how well your heart's chambers and valves are working. This procedure takes approximately one hour. There are no restrictions for this procedure.    Follow-Up:  Your physician recommends that you schedule a follow-up appointment in: [place]   If you need a refill on your cardiac medications before your next appointment, please call your pharmacy.

## 2017-03-20 NOTE — Progress Notes (Signed)
HPI: FU coronary artery disease status post coronary artery bypass graft, atrial flutter and previous pacemaker placement. Last cardiac catheterization in May of 2010 showed a normal left main, a 50-70% circumflex, a totally occluded LAD, and a focal 70% right coronary artery. The saphenous vein graft to the circumflex was occluded. The LIMA to the LAD was patent. Ejection fraction was 60%. Carotid Dopplers in October of 2011 were normal. Repeat echocardiogram in August 2015 showed EF 55-60, mild LAE. At previous OV pt noted to be in atrial flutter; seen by GT and rate control and anticoagulation recommended. Nuclear study 2/16 showed EF 52, fixed apical defect; no ischemia.  Abdominal CT 12/18 showed no aneurysm; 4 mm nodule and fu could be considered 12 months if pt high risk. Since last seen,  patient notes increased dyspnea on exertion and lower extremity edema.  He has chronic orthopnea.  He denies chest pain, palpitations or syncope.  Current Outpatient Medications  Medication Sig Dispense Refill  . albuterol (PROVENTIL HFA;VENTOLIN HFA) 108 (90 Base) MCG/ACT inhaler Inhale 2 puffs into the lungs every 6 (six) hours as needed for wheezing or shortness of breath. 1 Inhaler 2  . allopurinol (ZYLOPRIM) 300 MG tablet take 1 tablet by mouth once daily (Patient taking differently: Take 300 mg by mouth daily. ) 90 tablet 3  . atorvastatin (LIPITOR) 80 MG tablet TAKE 1 TABLET BY MOUTH  DAILY 90 tablet 3  . Blood Glucose Monitoring Suppl (ONE TOUCH ULTRA 2) w/Device KIT Use as directed 1 each 3  . carvedilol (COREG) 25 MG tablet TAKE 1 TABLET BY MOUTH TWO  TIMES DAILY 180 tablet 3  . Cholecalciferol (VITAMIN D) 1000 UNITS capsule Take 1,000 Units by mouth 2 (two) times daily. Reported on 02/15/2015    . donepezil (ARICEPT) 5 MG tablet TAKE 1 TABLET BY MOUTH AT  BEDTIME 90 tablet 1  . fluocinonide cream (LIDEX) 1.03 % Apply 1 application topically 2 (two) times daily. 30 g 2  . fluticasone (FLONASE)  50 MCG/ACT nasal spray USE 2 SPRAYS IN EACH  NOSTRIL EVERY DAY 48 g 0  . furosemide (LASIX) 40 MG tablet TAKE 1 TABLET BY MOUTH  DAILY 90 tablet 2  . gabapentin (NEURONTIN) 300 MG capsule TAKE 2 CAPSULES BY MOUTH 3  TIMES DAILY (Patient taking differently: TAKE 1 CAPSULE BY MOUTH 3  TIMES DAILY) 540 capsule 3  . glipiZIDE (GLUCOTROL XL) 10 MG 24 hr tablet TAKE 1 TABLET BY MOUTH  DAILY WITH BREAKFAST 90 tablet 3  . HYDROcodone-acetaminophen (NORCO) 10-325 MG tablet Take 1 tablet by mouth daily as needed. 30 tablet 0  . lisinopril (PRINIVIL,ZESTRIL) 2.5 MG tablet TAKE 1 TABLET BY MOUTH  DAILY 90 tablet 0  . metFORMIN (GLUCOPHAGE) 500 MG tablet TAKE 2 TABLETS BY MOUTH TWO TIMES DAILY 360 tablet 3  . methocarbamol (ROBAXIN) 500 MG tablet Take 1 tablet (500 mg total) by mouth every 8 (eight) hours as needed for muscle spasms. 30 tablet 0  . Multiple Vitamin (MULTIVITAMIN WITH MINERALS) TABS tablet Take 1 tablet by mouth every morning.    . ONE TOUCH ULTRA TEST test strip CHECK BLOOD SUGAR TWO TIMES DAILY 200 each 1  . ONETOUCH DELICA LANCETS 01T MISC USE AS DIRECTED THREE TIMES DAILY TO CHECK BLOOD SUGAR 300 each 0  . oxybutynin (DITROPAN XL) 10 MG 24 hr tablet Take 1 tablet (10 mg total) by mouth at bedtime. 90 tablet 3  . potassium chloride SA (K-DUR,KLOR-CON) 20 MEQ  tablet TAKE 1 TABLET BY MOUTH  EVERY EVENING 90 tablet 3  . predniSONE (DELTASONE) 10 MG tablet 3 tabs by mouth per day for 3 days,2tabs per day for 3 days,1tab per day for 3 days 18 tablet 0  . sitaGLIPtin (JANUVIA) 100 MG tablet Take 1 tablet (100 mg total) by mouth daily. 90 tablet 3  . tamsulosin (FLOMAX) 0.4 MG CAPS capsule TAKE 1 CAPSULE BY MOUTH  EVERY 12 HOURS 180 capsule 3  . tiZANidine (ZANAFLEX) 4 MG tablet TAKE 1 TABLET BY MOUTH  EVERY 6 HOURS AS NEEDED FOR MUSCLE SPASMS. 180 tablet 0  . traMADol (ULTRAM) 50 MG tablet Take 1 tablet (50 mg total) by mouth every 8 (eight) hours as needed. 60 tablet 0  . warfarin (COUMADIN) 5 MG  tablet TAKE 1 TABLET BY MOUTH DAILY OR AS DIRECTED BY COUMADIN CLINIC 90 tablet 1   No current facility-administered medications for this visit.      Past Medical History:  Diagnosis Date  . Aortic root dilatation ([place]) 02/02/2011  . Arthritis    "lower back; going back down both my sciatic nerves"  . Atrioventricular block, complete ([place]) 09/04/2008  . BENIGN PROSTATIC HYPERTROPHY 08/29/2006   takes Flomax daily  . 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 Cataracts, bilateral    immature  . CHF (congestive heart failure) ([facility])    takes Lasix daily  . Chronic back pain    HNP   . CORONARY ARTERY DISEASE 08/29/2006   takes Coumadin daily  . DEPRESSION 08/29/2006  . DIABETES MELLITUS, TYPE II 08/29/2006   takes Metformin,Januvia,and Glipizide  daily  . DIASTOLIC HEART FAILURE, CHRONIC 06/09/2008  . GOUT 04/22/2007   takes Allopurinol daily  . History of migraine    63yr ago  . HYPERLIPIDEMIA 08/29/2006   takes Atorvastatin daily  . HYPERTENSION 08/29/2006   takes Lisinopril daily  . INSOMNIA-SLEEP DISORDER-UNSPEC 10/23/2007  . Left lumbar radiculopathy 05/30/2010  . LUMBAR RADICULOPATHY, RIGHT 06/10/2007  . Muscle spasm    takes Zanaflex daily  . Myocardial infarction ([place] 12/27/10   "I've had several MIs"  . 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 PACEMAKER, PERMANENT 03/04/2008   pt denies this date  . Peripheral neuropathy    takes Gabapentin daily  . Presence of permanent cardiac pacemaker   . Shortness of breath dyspnea    "all my life" with exertion  . Sleep apnea    "if I lay flat I quit breathing; HOB up I'm fine"    Past Surgical History:  Procedure Laterality Date  . COLONOSCOPY    . CORONARY ANGIOPLASTY WITH STENT PLACEMENT  12/27/10   "I've had a total of 9 cardiac stents put in"  . CORONARY ARTERY BYPASS GRAFT  1992   CABG X 2  . ESOPHAGOGASTRODUODENOSCOPY N/A 12/01/2013   Procedure: ESOPHAGOGASTRODUODENOSCOPY (EGD);  Surgeon: DLafayette Dragon MD;  [facility];  Service: Endoscopy;  Laterality: N/A;  . INSERT / REPLACE / REMOVE PACEMAKER  ~ 2004   initial pacemaker placement  . INSERT / REPLACE / REMOVE PACEMAKER  10/2009   generator change  . LUMBAR LAMINECTOMY/DECOMPRESSION MICRODISCECTOMY Right 03/23/2014   Procedure: LAMINECTOMY AND FORAMINOTOMY RIGHT LUMBAR THREE-FOUR,LUMBAR FOUR-FIVE, LUMBAR FIVE-SACRAL ONE;  Surgeon: HCharlie Pitter MD;  [facility];  Service: Neurosurgery;  Laterality: Right;  right  . LUMBAR LAMINECTOMY/DECOMPRESSION MICRODISCECTOMY Left 12/01/2014   Procedure: Left Lumbar Three-Four, Lumbar Four-Five Laminectomy and Foraminotomy;  Surgeon: HEarnie Larsson MD;  [facility];  Service: Neurosurgery;  Laterality: Left;  . s/p left arm surgury after work accident  1991   "2000# steel fell on it"  . s/p right hand surgury for foreign object  1970's   "piece of wood went in my hand; had to get that out"    Social History   Socioeconomic History  . Marital status: Married    Spouse name: Not on file  . Number of children: 2  . Years of education: Not on file  . Highest education level: Not on file  Social Needs  . Financial resource strain: Not on file  . Food insecurity - worry: Not on file  . Food insecurity - inability: Not on file  . Transportation needs - medical: Not on file  . Transportation needs - non-medical: Not on file  Occupational History  . Occupation: prior work Designer, industrial/product: UNEMPLOYED  . Occupation: disabled since 2004  Tobacco Use  . Smoking status: Former Smoker    Packs/day: 3.00    Years: 9.00    Pack years: 27.00    Types: Cigarettes    Last attempt to quit: 02/26/1973    Years since quitting: 44.0  . Smokeless tobacco: Former Systems developer  . Tobacco comment: quit smoking 51yr ago  Substance and Sexual Activity  . Alcohol use: No    Alcohol/week: 0.0 oz  . Drug use: No    Comment: "used pouches of tobacco for a long time; quit those 12/09/1970"  .  Sexual activity: Yes  Other Topics Concern  . Not on file  Social History Narrative  . Not on file    Family History  Problem Relation Age of Onset  . Diabetes Mother   . Diabetes Sister   . Heart disease Sister        2 sister died with heart disease  . Coronary artery disease Other 562      male, first degree relative  . Diabetes Other        1st degree relative  . Heart disease Sister   . Lung cancer Sister        deceased    ROS: no fevers or chills, productive cough, hemoptysis, dysphasia, odynophagia, melena, hematochezia, dysuria, hematuria, rash, seizure activity, orthopnea, PND,  claudication. Remaining systems are negative.  Physical Exam: Well-developed well-nourished in no acute distress.  Skin is warm and dry.  HEENT is normal.  Neck is supple.  Chest is clear to auscultation with normal expansion.  Cardiovascular exam is regular rate and rhythm.  Abdominal exam nontender or distended. No masses palpated. Extremities show 1+ due to expense.  Edema. neuro grossly intact  ECG-ventricular pacing with underlying atrial flutter.  Personally reviewed  A/P  1 atrial flutter-continue carvedilol for rate control.  Continue Coumadin.  Atrial flutter ablation not recommended previously. note patient not taking apixaban due to cost.   2 coronary artery disease-patient doing well with no chest pain.  Continue statin.  No aspirin given need for anticoagulation.  3 chronic diastolic congestive heart failure-patient is volume overloaded on examination.  Change Lasix from 80 mg twice daily to 120 mg twice daily.  In 1 week check potassium and renal function.  Repeat echocardiogram to reassess LV function.  4 hypertension-blood pressure is controlled.  Continue present medications.  5 hyperlipidemia-continue statin.  6 prior pacemaker-managed by electrophysiology.  7 lung nodule-managed by primary care.  BKirk Ruths MD

## 2017-03-22 ENCOUNTER — Other Ambulatory Visit: Payer: Self-pay

## 2017-03-22 ENCOUNTER — Encounter: Payer: Self-pay | Admitting: *Deleted

## 2017-03-22 ENCOUNTER — Ambulatory Visit (HOSPITAL_COMMUNITY): Payer: Medicare Other | Attending: Cardiology

## 2017-03-22 DIAGNOSIS — I110 Hypertensive heart disease with heart failure: Secondary | ICD-10-CM | POA: Insufficient documentation

## 2017-03-22 DIAGNOSIS — I5032 Chronic diastolic (congestive) heart failure: Secondary | ICD-10-CM | POA: Insufficient documentation

## 2017-03-22 DIAGNOSIS — I4892 Unspecified atrial flutter: Secondary | ICD-10-CM | POA: Insufficient documentation

## 2017-03-22 DIAGNOSIS — I38 Endocarditis, valve unspecified: Secondary | ICD-10-CM | POA: Insufficient documentation

## 2017-03-22 DIAGNOSIS — E785 Hyperlipidemia, unspecified: Secondary | ICD-10-CM | POA: Diagnosis not present

## 2017-03-22 DIAGNOSIS — G4730 Sleep apnea, unspecified: Secondary | ICD-10-CM | POA: Diagnosis not present

## 2017-03-22 DIAGNOSIS — Z951 Presence of aortocoronary bypass graft: Secondary | ICD-10-CM | POA: Diagnosis not present

## 2017-03-22 DIAGNOSIS — Z950 Presence of cardiac pacemaker: Secondary | ICD-10-CM | POA: Insufficient documentation

## 2017-03-22 DIAGNOSIS — I2510 Atherosclerotic heart disease of native coronary artery without angina pectoris: Secondary | ICD-10-CM | POA: Diagnosis not present

## 2017-03-22 DIAGNOSIS — M549 Dorsalgia, unspecified: Secondary | ICD-10-CM | POA: Diagnosis not present

## 2017-03-22 DIAGNOSIS — I252 Old myocardial infarction: Secondary | ICD-10-CM | POA: Insufficient documentation

## 2017-03-22 DIAGNOSIS — E119 Type 2 diabetes mellitus without complications: Secondary | ICD-10-CM | POA: Insufficient documentation

## 2017-03-22 DIAGNOSIS — I509 Heart failure, unspecified: Secondary | ICD-10-CM | POA: Insufficient documentation

## 2017-03-22 DIAGNOSIS — G8929 Other chronic pain: Secondary | ICD-10-CM | POA: Diagnosis not present

## 2017-03-22 DIAGNOSIS — I77810 Thoracic aortic ectasia: Secondary | ICD-10-CM | POA: Diagnosis not present

## 2017-03-23 ENCOUNTER — Other Ambulatory Visit: Payer: Self-pay | Admitting: *Deleted

## 2017-03-23 ENCOUNTER — Telehealth: Payer: Self-pay | Admitting: Internal Medicine

## 2017-03-23 MED ORDER — ATORVASTATIN CALCIUM 80 MG PO TABS: 80.0000 mg | ORAL_TABLET | Freq: Every day | ORAL | 3 refills | Status: DC

## 2017-03-23 NOTE — Telephone Encounter (Signed)
Copied from [place] [phone]. Topic: Quick Communication - See Telephone Encounter >> Mar 23, 2017 10:57 AM Cleaster Corin, NT wrote: CRM for notification. See Telephone encounter for:   03/23/17. Pt. Calling to see if he can get a letter stating that he can have his mail box moved closer to his home instead of walking to the street due to his health conditions. Pt. Is having to walk about a tenth of a mile to go to his mailbox.

## 2017-03-26 ENCOUNTER — Telehealth: Payer: Self-pay | Admitting: Cardiology

## 2017-03-26 ENCOUNTER — Telehealth: Payer: Self-pay | Admitting: *Deleted

## 2017-03-26 ENCOUNTER — Encounter: Payer: Self-pay | Admitting: Internal Medicine

## 2017-03-26 NOTE — Telephone Encounter (Signed)
Patient aware of echo results. He is asking if we will write a letter asking for his mailbox to be moved closer. He reports his mailbox is about 400 yards from the house and when he gets there he has to stand and wait to catch his breath to walk back. Will forward for dr Stanford Breed review

## 2017-03-26 NOTE — Telephone Encounter (Signed)
New Message   [id number] reference  Pt c/o medication issue:  1. Name of Medication:  lasik 40 mg  2. How are you currently taking this medication (dosage and times per day)? 3 tablets 2x a day   3. Are you having a reaction (difficulty breathing--STAT)? no  4. What is your medication issue? Is this dosage correct, pt went from 1 tablet to 6 tablets

## 2017-03-26 NOTE — Telephone Encounter (Signed)
Spoke with pharmacy, aware 120 mg of furosemide twice daily is correct.

## 2017-03-26 NOTE — Telephone Encounter (Signed)
Done erx 

## 2017-03-26 NOTE — Progress Notes (Signed)
Letter sent.

## 2017-03-27 ENCOUNTER — Other Ambulatory Visit: Payer: Self-pay

## 2017-03-27 ENCOUNTER — Ambulatory Visit (HOSPITAL_COMMUNITY): Payer: Medicare Other

## 2017-03-27 ENCOUNTER — Encounter: Payer: Self-pay | Admitting: *Deleted

## 2017-03-27 ENCOUNTER — Encounter (HOSPITAL_COMMUNITY): Payer: Self-pay

## 2017-03-27 ENCOUNTER — Other Ambulatory Visit: Payer: Self-pay | Admitting: *Deleted

## 2017-03-27 DIAGNOSIS — R200 Anesthesia of skin: Secondary | ICD-10-CM

## 2017-03-27 DIAGNOSIS — R202 Paresthesia of skin: Secondary | ICD-10-CM

## 2017-03-27 DIAGNOSIS — M436 Torticollis: Secondary | ICD-10-CM

## 2017-03-27 DIAGNOSIS — M542 Cervicalgia: Secondary | ICD-10-CM

## 2017-03-27 DIAGNOSIS — M6281 Muscle weakness (generalized): Secondary | ICD-10-CM

## 2017-03-27 NOTE — Telephone Encounter (Signed)
Spoke with pt, letter will be generated and placed at the front desk for patient pick up.

## 2017-03-27 NOTE — Telephone Encounter (Signed)
Left message for pt to call.

## 2017-03-27 NOTE — Telephone Encounter (Signed)
[name] is returning your call. States  That he will be at home for the rest of the day . Thanks

## 2017-03-27 NOTE — Telephone Encounter (Signed)
Ok for letter [name]  

## 2017-03-27 NOTE — Patient Instructions (Signed)
   Supine Cervical Retraction into Towel Roll: 1-2 times for 10 repetitions  Patient tucks their chin and pulls their neck back into the towel.

## 2017-03-27 NOTE — Telephone Encounter (Signed)
Routed to Debra, RN. 
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
## 2017-03-27 NOTE — Telephone Encounter (Signed)
Follow up ° ° °Patient returning call to nurse °

## 2017-03-28 DIAGNOSIS — I5032 Chronic diastolic (congestive) heart failure: Secondary | ICD-10-CM | POA: Diagnosis not present

## 2017-03-28 DIAGNOSIS — I38 Endocarditis, valve unspecified: Secondary | ICD-10-CM | POA: Diagnosis not present

## 2017-03-28 LAB — BASIC METABOLIC PANEL
BUN/Creatinine Ratio: 20 (ref 10–24)
BUN: 22 mg/dL (ref 8–27)
CALCIUM: 8.7 mg/dL (ref 8.6–10.2)
CHLORIDE: 99 mmol/L (ref 96–106)
CO2: 28 mmol/L (ref 20–29)
Creatinine, Ser: 1.08 mg/dL (ref 0.76–1.27)
GFR, EST AFRICAN AMERICAN: 79 mL/min/{1.73_m2} (ref >59)
GFR, EST NON AFRICAN AMERICAN: 69 mL/min/{1.73_m2} (ref >59)
Glucose: 138 mg/dL — ABNORMAL HIGH (ref 65–99)
Potassium: 4.8 mmol/L (ref 3.5–5.2)
Sodium: 142 mmol/L (ref 134–144)

## 2017-03-29 ENCOUNTER — Encounter: Payer: Self-pay | Admitting: *Deleted

## 2017-03-29 ENCOUNTER — Other Ambulatory Visit: Payer: Self-pay

## 2017-03-29 NOTE — Patient Outreach (Signed)
[name]) Care Management  03/29/2017  MOHSEN ODENTHAL 02-Aug-1945 [id number]   1st outreach attempt to the patient for monthly assessment. Wife answered the phone and stated that he was not at home. HIPAA compliant message left with contact information  Plan:  [facility] will contact the patient in one business day.  [name] RN, BSN, [place] Direct Dial:  [phone] Fax: [phone]

## 2017-03-30 ENCOUNTER — Ambulatory Visit (INDEPENDENT_AMBULATORY_CARE_PROVIDER_SITE_OTHER): Payer: Medicare Other | Admitting: Orthopaedic Surgery

## 2017-03-30 ENCOUNTER — Ambulatory Visit (INDEPENDENT_AMBULATORY_CARE_PROVIDER_SITE_OTHER): Payer: Self-pay

## 2017-03-30 ENCOUNTER — Encounter (INDEPENDENT_AMBULATORY_CARE_PROVIDER_SITE_OTHER): Payer: Self-pay | Admitting: Orthopaedic Surgery

## 2017-03-30 ENCOUNTER — Other Ambulatory Visit: Payer: Self-pay | Admitting: *Deleted

## 2017-03-30 ENCOUNTER — Encounter: Payer: Self-pay | Admitting: *Deleted

## 2017-03-30 VITALS — BP 97/61 | HR 67 | Ht 71.0 in | Wt 255.0 lb

## 2017-03-30 DIAGNOSIS — M542 Cervicalgia: Secondary | ICD-10-CM | POA: Diagnosis not present

## 2017-03-30 NOTE — Patient Outreach (Signed)
[facility]) Care Management  03/30/2017  [name] 22-Jan-1946 [id number]   Outreach Attempt:  Successful telephone outreach to patient for monthly follow up.  HIPAA verified with patient.  Patient reporting he has a "pinched nerve in neck and arm."  States he has been going to outpatient therapy and feels his pain in getting worse since therapy and he is has been dropping stuff more often.  Encouraged patient to speak with orthopedic physician about his concerns at the appointment today.  Patient denies issues with transportation to Outpatient Therapy appointments, states he cannot afford the co pay for therapy appointments except once a week.  Reports his fasting blood sugar this morning is 153 and has been ranging 140-150's over the last few weeks.  States his physician told him his goal fasting sugar should be between 80-160.  Patient unsure of his latest A1C.  Last A1C 6.8 in January 2019.  Denies any chest pain and states shortness of breath is the same and describes it as chronic. Verbalizes his swelling is reduced in his lower extremities since the increase in his dosage of lasix at his last Cardiologist appointment.  Verified with patient his lasix dose is 40 mg tablets, take 3 tablets twice a day.  Patient verbalized his correct dose of lasix.  Patient stating he does not weigh himself daily.  Reviewed with patient the importance of weighing himself daily with the management of his heart failure.  Patient stated his understanding and stated he would start to weigh daily.  Appointments:  Patient reports he has an orthopedic appointment today at 1430 with Dr. Lorin Mercy.  States he saw [name] on 03/09/2017 and has scheduled follow up on 08/01/2017.  Reports seeing Dr. Stanford Breed with Cardiology on 03/20/2017 and has scheduled follow up on 06/28/2017.  Plan:  [name] will make next monthly telephone outreach to patient in the month of April.  [place] [phone] [name]
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
## 2017-03-30 NOTE — Addendum Note (Signed)
Addended by: Meyer Cory on: 03/30/2017 03:24 PM   Modules accepted: Orders

## 2017-04-02 ENCOUNTER — Ambulatory Visit (INDEPENDENT_AMBULATORY_CARE_PROVIDER_SITE_OTHER): Payer: Medicare Other | Admitting: *Deleted

## 2017-04-02 DIAGNOSIS — I4891 Unspecified atrial fibrillation: Secondary | ICD-10-CM | POA: Diagnosis not present

## 2017-04-02 DIAGNOSIS — I483 Typical atrial flutter: Secondary | ICD-10-CM

## 2017-04-02 DIAGNOSIS — Z7901 Long term (current) use of anticoagulants: Secondary | ICD-10-CM | POA: Diagnosis not present

## 2017-04-02 LAB — POCT INR: INR: 3

## 2017-04-02 NOTE — Patient Instructions (Signed)
  Take 1/2 tablet tonight then resume 1/2 tablet daily except 1 tablet Mondays, Wednesdays and Fridays Recheck in 4 weeks

## 2017-04-03 ENCOUNTER — Encounter (HOSPITAL_COMMUNITY): Payer: Self-pay

## 2017-04-03 ENCOUNTER — Other Ambulatory Visit: Payer: Self-pay

## 2017-04-03 ENCOUNTER — Ambulatory Visit (HOSPITAL_COMMUNITY): Payer: Medicare Other | Attending: Internal Medicine

## 2017-04-03 DIAGNOSIS — M542 Cervicalgia: Secondary | ICD-10-CM | POA: Insufficient documentation

## 2017-04-03 DIAGNOSIS — R202 Paresthesia of skin: Secondary | ICD-10-CM | POA: Insufficient documentation

## 2017-04-03 DIAGNOSIS — M436 Torticollis: Secondary | ICD-10-CM | POA: Insufficient documentation

## 2017-04-03 DIAGNOSIS — M6281 Muscle weakness (generalized): Secondary | ICD-10-CM | POA: Insufficient documentation

## 2017-04-03 DIAGNOSIS — R200 Anesthesia of skin: Secondary | ICD-10-CM | POA: Insufficient documentation

## 2017-04-03 NOTE — Therapy (Signed)
[facility] [address] [place], [place], [address] Phone: [phone]   Fax:  [phone]  Physical Therapy Treatment/Discharge Summary  Patient Details  Name: [name] MRN: [id number] Date of Birth: 1945/08/03 Referring Provider: Biagio Borg, MD   Encounter Date: 04/03/2017  PT End of Session - 04/03/17 1027    Visit Number  3    Number of Visits  7    Date for PT Re-Evaluation  04/03/17    Authorization Type  [employer] Medicare    Authorization Time Period  03/13/17 - 04/24/17    PT Start Time  0818    PT Stop Time  0902    PT Time Calculation (min)  44 min    Activity Tolerance  Patient tolerated treatment well;Patient limited by pain    Behavior During Therapy  [facility] for tasks assessed/performed       PHYSICAL THERAPY DISCHARGE SUMMARY  Visits from Start of Care: 3  Current functional level related to goals / functional outcomes: Re-assessment was performed today and patient has not met any goals for therapy. He has had poor compliance with attendance and has made little to no progress towards goals. He has not made any improvements in ROM for cervical spine, or MMT for Lt UE. He continues to have ongoing numbness and tingling in his Lt UE and has reported increased frequency of dropping things with his left hand like cups.  He reported he saw an Orthopedic doctor to evaluate his cervical spine and they have ordered a NCV test for his Lt UE. The patient was confused about the pathophysiology and process of a cervical radiculopathy and extensive time was spent educating him on his symptoms and the possible source of his pain/symptoms. I discussed his lack of progress and need for further testing to determine the most appropriate course of treatment for him and explained the decision to discharge the patient from therapy. He was agreeable to this and will continue to follow-up with his physician.     Remaining deficits: See  below details. No progress made towards goals.   Education / Equipment: Extensive time spent today on educating patient regarding process and possible source of cervical radiculopathy as well as testing to determine nerves affected. Patient required visual diagrams and examples to understand the pathophysiology of his possible nerve impingement. He required repeated simple descriptions of anatomy and physiology and was educated on benefits of further follow-up with MD and encouraged to ask questions to have them explain his condition in a way he can understand it. He was educated that he can return to therapy if his physicians feel it is appropriate after further testing has been performed.   Plan: Patient agrees to discharge.  Patient goals were not met. Patient is being discharged due to lack of progress.  ?????       Past Medical History:  Diagnosis Date  . Aortic root dilatation ([place]) 02/02/2011  . Arthritis    "lower back; going back down both my sciatic nerves"  . 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 BENIGN PROSTATIC HYPERTROPHY 08/29/2006   takes Flomax daily  . 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 Cataracts, bilateral    immature  . CHF (congestive heart failure) (HCC)    takes Lasix daily  . Chronic back pain    HNP   . CORONARY ARTERY DISEASE 08/29/2006   takes Coumadin daily  . DEPRESSION 08/29/2006  . DIABETES MELLITUS, TYPE II  08/29/2006   takes Metformin,Januvia,and Glipizide  daily  . DIASTOLIC HEART FAILURE, CHRONIC 06/09/2008  . GOUT 04/22/2007   takes Allopurinol daily  . History of migraine    65yr ago  . HYPERLIPIDEMIA 08/29/2006   takes Atorvastatin daily  . HYPERTENSION 08/29/2006   takes Lisinopril daily  . INSOMNIA-SLEEP DISORDER-UNSPEC 10/23/2007  . Left lumbar radiculopathy 05/30/2010  . LUMBAR RADICULOPATHY, RIGHT 06/10/2007  . Muscle spasm    takes Zanaflex daily  . Myocardial infarction ([place] 12/27/10   "I've had several MIs"  .  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 PACEMAKER, PERMANENT 03/04/2008   pt denies this date  . Peripheral neuropathy    takes Gabapentin daily  . Presence of permanent cardiac pacemaker   . Shortness of breath dyspnea    "all my life" with exertion  . Sleep apnea    "if I lay flat I quit breathing; HOB up I'm fine"    Past Surgical History:  Procedure Laterality Date  . COLONOSCOPY    . CORONARY ANGIOPLASTY WITH STENT PLACEMENT  12/27/10   "I've had a total of 9 cardiac stents put in"  . CORONARY ARTERY BYPASS GRAFT  1992   CABG X 2  . ESOPHAGOGASTRODUODENOSCOPY N/A 12/01/2013   Procedure: ESOPHAGOGASTRODUODENOSCOPY (EGD);  Surgeon: DLafayette Dragon MD;  [facility];  Service: Endoscopy;  Laterality: N/A;  . INSERT / REPLACE / REMOVE PACEMAKER  ~ 2004   initial pacemaker placement  . INSERT / REPLACE / REMOVE PACEMAKER  10/2009   generator change  . LUMBAR LAMINECTOMY/DECOMPRESSION MICRODISCECTOMY Right 03/23/2014   Procedure: LAMINECTOMY AND FORAMINOTOMY RIGHT LUMBAR THREE-FOUR,LUMBAR FOUR-FIVE, LUMBAR FIVE-SACRAL ONE;  Surgeon: HCharlie Pitter MD;  [facility];  Service: Neurosurgery;  Laterality: Right;  right  . LUMBAR LAMINECTOMY/DECOMPRESSION MICRODISCECTOMY Left 12/01/2014   Procedure: Left Lumbar Three-Four, Lumbar Four-Five Laminectomy and Foraminotomy;  Surgeon: HEarnie Larsson MD;  [facility];  Service: Neurosurgery;  Laterality: Left;  . s/p left arm surgury after work accident  1991   "2000# steel fell on it"  . s/p right hand surgury for foreign object  1970's   "piece of wood went in my hand; had to get that out"    There were no vitals filed for this visit.      [facility] 04[facility]17 0001      Assessment   Medical Diagnosis  Left Cervical Radiculopathy    Referring Provider  JBiagio Borg MD    Onset Date/Surgical Date  01/26/18      Observation/Other Assessments   Neck Disability Index   -- 37/50 on 03/13/17       Sensation   Additional Comments  patient reports numbness and tingling throughout entire Lt UE rather than dermatome or peripheral nerve pattern      Posture/Postural Control   Posture/Postural Control  Postural limitations    Postural Limitations  Rounded Shoulders;Forward head;Decreased lumbar lordosis;Increased thoracic kyphosis      AROM   Cervical Flexion  10    Cervical Extension  5    Cervical - Right Side Bend  15    Cervical - Left Side Bend  10    Cervical - Right Rotation  18    Cervical - Left Rotation  22      Strength   Right Shoulder Flexion  4-/5    Right Shoulder ABduction  4/5    Left Shoulder Flexion  2+/5    Left Shoulder ABduction  2+/5    Right Elbow Flexion  4+/5    Right Elbow Extension  4+/5    Left Elbow Flexion  3+/5    Left Elbow Extension  3/5    Right Wrist Flexion  4+/5    Right Wrist Extension  4+/5    Left Wrist Flexion  3+/5    Left Wrist Extension  3/5    Right Hand Grip (lbs)  50    Left Hand Grip (lbs)  18           PT Education - 04/03/17 1213    Education provided  Yes    Education Details  Extensive time spent today on educating patient regarding process and possible source of cervical radiculopathy as well as testing to determine nerves affected. Patient required visual diagrams and examples to understand the pathophysiology of his possible nerve impingement. He required repeated simple descriptions of anatomy and physiology and was educated on benefits of further follow-up with MD and encouraged to ask questions to have them explain his condition in a way he can understand it. He was educated that he can return to therapy if his physicians feel it is appropriate after further testing has been performed.    Person(s) Educated  Patient    Methods  Explanation visual teaching tools; teach-back    Comprehension  Verbalized understanding       PT Short Term Goals - 04/03/17 1749      PT SHORT TERM GOAL #1   Title  Patient will be  independent with HEP to improve pain level and cervical mobility for improved function with daily activities    Time  3    Period  Weeks    Status  Not Met      PT SHORT TERM GOAL #2   Title  Patient will improve Cervical AROM and Left UE AROM by 10 degrees without increased pain or discomfort to improve ability to complete daily tasks.    Time  3    Period  Weeks    Status  Not Met      PT SHORT TERM GOAL #3   Title  Patient will demonstrate 10 point improvement in NDI to show clinically meanigful improvement in function.    Time  3    Period  Weeks    Status  Not Met        PT Long Term Goals - 04/03/17 1749      PT LONG TERM GOAL #1   Title  Patient will improve Cervical AROM and Left UE AROM by 15 or more degrees without increased pain or discomfort to improve ability to complete daily tasks.    Time  6    Status  Not Met      PT LONG TERM GOAL #2   Title  Patient will demonstrate improved MMT for lmited muscle groups by 1/2 grade to have improved functional strength for improved function with daily activities.    Time  6    Period  Weeks    Status  Not Met      PT LONG TERM GOAL #3   Title  Patient will have 10 lb increase in left grip strength to demonstrate improved function and report a decrease in freqency of dropping items.    Time  6    Period  Weeks    Status  Not Met      PT LONG TERM GOAL #4   Title  Patient will report no  numbness/tingling or radicular symptoms below the elbow to show centralization of symptoms and improve function of Lt UE.    Time  6    Period  Weeks    Status  Not Met         Plan - 04/03/17 1728    Clinical Impression Statement  Re-assessment was performed today and patient has not met any goals for therapy. He has had poor compliance with attendance and has made little to no progress towards goals. He has not made any improvements in ROM for cervical spine, or MMT for Lt UE. He continues to have ongoing numbness and tingling in  his Lt UE and has reported increased frequency of dropping things with his left hand like cups.  He reported he saw an Orthopedic doctor to evaluate his cervical spine and they have ordered a NCV test for his Lt UE. The patient was confused about the pathophysiology and process of a cervical radiculopathy and extensive time was spent educating him on his symptoms and the possible source of his pain/symptoms. I discussed his lack of progress and need for further testing to determine the most appropriate course of treatment for him and explained the decision to discharge the patient from therapy. He was agreeable to this and will continue to follow-up with his physician.     Rehab Potential  Fair    Clinical Impairments Affecting Rehab Potential  (-) poor compliance with PT in past, (+) motivated to avoid surgery    PT Frequency  1x / week    PT Duration  6 weeks    PT Treatment/Interventions  ADLs/Self Care Home Management;Electrical Stimulation;Therapeutic activities;Therapeutic exercise;Neuromuscular re-education;Patient/family education;Manual techniques;Passive range of motion;Cryotherapy;Traction;Functional mobility training;Taping    PT Next Visit Plan  dishcargin patient to MD care for further testing and evaluation.    PT Home Exercise Plan  Eval: scapular retraction; 03/27/17 - supine chin tuck;     Consulted and Agree with Plan of Care  Patient       Patient will benefit from skilled therapeutic intervention in order to improve the following deficits and impairments:  Decreased mobility, Hypomobility, Increased muscle spasms, Impaired sensation, Decreased range of motion, Improper body mechanics, Decreased activity tolerance, Decreased strength, Postural dysfunction, Pain, Impaired UE functional use, Increased fascial restricitons, Impaired flexibility  Visit Diagnosis: Cervicalgia  Stiffness of neck  Muscle weakness of left arm  Numbness and tingling in left arm  Muscle weakness  (generalized)     Problem List Patient Active Problem List   Diagnosis Date Noted  . Left cervical radiculopathy 02/07/2017  . Trochanteric bursitis, right hip 01/01/2017  . Right hip pain 12/26/2016  . Knee instability, right 11/03/2016  . Urinary frequency 10/20/2016  . Right foot pain 09/29/2016  . Prepatellar bursitis 06/29/2016  . Left knee pain 06/28/2016  . Ingrown nail 06/28/2016  . Acute gouty arthritis 05/07/2016  . Left shoulder pain 02/09/2016  . Memory loss 01/19/2016  . Ingrown toenail 09/13/2015  . Renal insufficiency 09/08/2015  . Right leg swelling 07/13/2015  . Leg wound, right 06/15/2015  . Rash 06/06/2015  . Anemia 05/24/2015  . Cellulitis of leg, right 05/13/2015  . Greater trochanteric bursitis of left hip 08/12/2014  . Left leg pain 07/15/2014  . 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 Lumbar stenosis with neurogenic claudication 03/23/2014  . Long-term (current) use of anticoagulants 02/27/2014  . Dysphagia, pharyngoesophageal phase 12/01/2013  . 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 Dysphagia 11/11/2013  .  Lumbago 11/03/2013  . Low back pain radiating to right leg 11/03/2013  . Abnormality of gait 11/03/2013  . Difficulty walking 11/03/2013  . Loss of weight 10/29/2013  . Atrial fibrillation ([place]) 08/26/2013  . Chest pain 04/05/2013  . Plantar fasciitis of right foot 02/18/2013  . Bruit 08/21/2012  . Sciatica of left side 08/15/2012  . Left hip pain 08/15/2012  . Headache(784.0) 05/03/2012  . Increased prostate specific antigen (PSA) velocity 11/11/2011  . Right sided sciatica 11/10/2011  . Chronic low back pain 05/03/2011  . Aortic root dilatation ([place]) 02/02/2011  . Wheezing without diagnosis of asthma 02/02/2011  . Syncope 12/27/2010  . Left lumbar radiculopathy 05/30/2010  . Preventative health care 05/30/2010  . 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 DIASTOLIC HEART  FAILURE, CHRONIC 06/09/2008  . KNEE PAIN, BILATERAL 04/21/2008  . LEG PAIN, BILATERAL 04/21/2008  . 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 PACEMAKER, PERMANENT 03/04/2008  . INSOMNIA-SLEEP DISORDER-UNSPEC 10/23/2007  . Lumbar radiculopathy, chronic 06/10/2007  . GOUT 04/22/2007  . 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 ERECTILE DYSFUNCTION 11/26/2006  . ALLERGIC RHINITIS 11/26/2006  . Type 2 diabetes, uncontrolled, with neuropathy ([place]) 08/29/2006  . Hyperlipidemia 08/29/2006  . Overweight(278.02) 08/29/2006  . DEPRESSION 08/29/2006  . Essential hypertension 08/29/2006  . CORONARY ARTERY DISEASE 08/29/2006  . BENIGN PROSTATIC HYPERTROPHY 08/29/2006    Kipp Brood, PT, DPT Physical Therapist with [facility]  04/03/2017, 12:12 PM  [facility] [address] [place], [place], [address] Phone: [phone]   Fax:  [phone]  Name: [name] MRN: [id number] Date of Birth: 03/09/1945

## 2017-04-07 ENCOUNTER — Other Ambulatory Visit: Payer: Self-pay | Admitting: Internal Medicine

## 2017-04-10 ENCOUNTER — Encounter (HOSPITAL_COMMUNITY): Payer: Medicare Other

## 2017-04-17 ENCOUNTER — Encounter: Payer: Self-pay | Admitting: Podiatry

## 2017-04-17 ENCOUNTER — Ambulatory Visit (INDEPENDENT_AMBULATORY_CARE_PROVIDER_SITE_OTHER): Payer: Medicare Other | Admitting: Podiatry

## 2017-04-17 ENCOUNTER — Encounter (HOSPITAL_COMMUNITY): Payer: Medicare Other

## 2017-04-17 DIAGNOSIS — E1142 Type 2 diabetes mellitus with diabetic polyneuropathy: Secondary | ICD-10-CM

## 2017-04-17 DIAGNOSIS — L603 Nail dystrophy: Secondary | ICD-10-CM

## 2017-04-17 NOTE — Progress Notes (Signed)
This patient presents the office with a complaint that his right big toe nail frequently catches his socks.  He was seen in this office in July of last year where I performed an incision and drainage due to an infection caused by fragmenting of his nairight great toe.  He says there is no pain, drainage or swelling noted.  He returns to the office today for an evaluation of this nail problem right big toe.     General Appearance  Alert, conversant and in no acute stress.  Vascular  Dorsalis pedis and posterior tibial  pulses are palpable  bilaterally.  Capillary return is within normal limits  bilaterally. Temperature is within normal limits  bilaterally.  Neurologic  Senn-Weinstein monofilament wire test within normal limits  bilaterally. Muscle power within normal limits bilaterally.  Nails Thick disfigured discolored nails with subungual debris  from hallux to fifth toes bilaterally. No evidence of bacterial infection or drainage bilaterally. There is a break in the skin along the lateral border of the right great toenail.  Orthopedic  No limitations of motion of motion feet .  No crepitus or effusions noted.  No bony pathology or digital deformities noted.  Skin  normotropic skin with no porokeratosis noted bilaterally.  No signs of infections or ulcers noted.    Nail Dystrophy  ROV  Used dremel  tool and corrected the nail problem.  The nail is smoothed  and the fragmenting has been eliminated.  RTC 6 weeks for preventative foot care services.   Gardiner Barefoot DPM

## 2017-04-18 ENCOUNTER — Ambulatory Visit: Payer: Medicare Other | Admitting: Podiatry

## 2017-04-18 ENCOUNTER — Ambulatory Visit: Payer: Medicare Other | Admitting: Urology

## 2017-04-18 DIAGNOSIS — R3915 Urgency of urination: Secondary | ICD-10-CM | POA: Diagnosis not present

## 2017-04-18 DIAGNOSIS — N401 Enlarged prostate with lower urinary tract symptoms: Secondary | ICD-10-CM | POA: Diagnosis not present

## 2017-04-18 DIAGNOSIS — N5201 Erectile dysfunction due to arterial insufficiency: Secondary | ICD-10-CM | POA: Diagnosis not present

## 2017-04-26 ENCOUNTER — Telehealth: Payer: Self-pay | Admitting: Internal Medicine

## 2017-04-26 ENCOUNTER — Other Ambulatory Visit: Payer: Self-pay | Admitting: Internal Medicine

## 2017-04-26 MED ORDER — WARFARIN SODIUM 5 MG PO TABS
ORAL_TABLET | ORAL | 3 refills | Status: DC
Start: 2017-04-26 — End: 2018-06-20

## 2017-04-26 NOTE — Telephone Encounter (Signed)
Copied from [place] [id number]. Topic: Quick Communication - Rx Refill/Question >> Apr 26, 2017  8:34 AM Synthia Innocent wrote: Medication: warfarin (COUMADIN) 5 MG tablet  Has the patient contacted their pharmacy? Yes.   (Agent: If no, request that the patient contact the pharmacy for the refill.) Preferred Pharmacy (with phone number or street name): [facility] on [address]: Please be advised that RX refills may take up to 3 business days. We ask that you follow-up with your pharmacy.

## 2017-04-26 NOTE — Telephone Encounter (Signed)
For review by coumadin clinic nurse- not sure if PCP fills this

## 2017-04-26 NOTE — Telephone Encounter (Signed)
Please refill.

## 2017-04-26 NOTE — Telephone Encounter (Signed)
Coumadin Rx sent to [facility]

## 2017-04-27 ENCOUNTER — Ambulatory Visit: Payer: Medicare Other | Admitting: Cardiology

## 2017-05-03 ENCOUNTER — Other Ambulatory Visit: Payer: Self-pay

## 2017-05-03 ENCOUNTER — Telehealth: Payer: Self-pay | Admitting: Internal Medicine

## 2017-05-03 DIAGNOSIS — I5032 Chronic diastolic (congestive) heart failure: Principal | ICD-10-CM

## 2017-05-03 DIAGNOSIS — I38 Endocarditis, valve unspecified: Secondary | ICD-10-CM

## 2017-05-03 NOTE — Patient Outreach (Signed)
[facility] Care Management  05/03/2017  [name] Dec 22, 1945 [id number]   1st unsuccessful outreach to the patient for monthly assessment. No answer.  HIPAA compliant voicemail left with contact information.  Plan:  RN Health Coach will make an outreach attempt to the patient within three to four business days.  [name] RN, BSN, [place] Direct Dial:  [phone]  Fax: [phone]

## 2017-05-03 NOTE — Telephone Encounter (Signed)
This encounter was created in error - please disregard.

## 2017-05-03 NOTE — Telephone Encounter (Signed)
Copied from [place] [id number]. Topic: General - Other >> May 03, 2017  1:33 PM Cecelia Byars, NT wrote: Reason for CRM: Patient called and said he needs all of his medications faxed to [facility], [place] [phone] (Phone) [phone] (Fax) he refused  to specify which medication and  only stated all needed to be refilled ,

## 2017-05-04 ENCOUNTER — Ambulatory Visit (INDEPENDENT_AMBULATORY_CARE_PROVIDER_SITE_OTHER): Payer: Medicare Other | Admitting: Physical Medicine and Rehabilitation

## 2017-05-04 ENCOUNTER — Encounter (INDEPENDENT_AMBULATORY_CARE_PROVIDER_SITE_OTHER): Payer: Self-pay | Admitting: Physical Medicine and Rehabilitation

## 2017-05-04 DIAGNOSIS — R202 Paresthesia of skin: Secondary | ICD-10-CM | POA: Diagnosis not present

## 2017-05-04 DIAGNOSIS — R531 Weakness: Secondary | ICD-10-CM

## 2017-05-04 MED ORDER — METHOCARBAMOL 500 MG PO TABS: 500.0000 mg | ORAL_TABLET | Freq: Three times a day (TID) | ORAL | 2 refills | Status: DC | PRN

## 2017-05-04 MED ORDER — TAMSULOSIN HCL 0.4 MG PO CAPS: ORAL_CAPSULE | ORAL | 2 refills | Status: DC

## 2017-05-04 MED ORDER — DONEPEZIL HCL 5 MG PO TABS: 5.0000 mg | ORAL_TABLET | Freq: Every day | ORAL | 2 refills | Status: DC

## 2017-05-04 MED ORDER — TIZANIDINE HCL 4 MG PO TABS: ORAL_TABLET | ORAL | 2 refills | Status: DC

## 2017-05-04 MED ORDER — ALBUTEROL SULFATE HFA 108 (90 BASE) MCG/ACT IN AERS: 2.0000 | INHALATION_SPRAY | Freq: Four times a day (QID) | RESPIRATORY_TRACT | 2 refills | Status: DC | PRN

## 2017-05-04 MED ORDER — CARVEDILOL 25 MG PO TABS: 25.0000 mg | ORAL_TABLET | Freq: Two times a day (BID) | ORAL | 2 refills | Status: DC

## 2017-05-04 MED ORDER — METFORMIN HCL 500 MG PO TABS: ORAL_TABLET | ORAL | 2 refills | Status: DC

## 2017-05-04 MED ORDER — ATORVASTATIN CALCIUM 80 MG PO TABS: 80.0000 mg | ORAL_TABLET | Freq: Every day | ORAL | 3 refills | Status: DC

## 2017-05-04 MED ORDER — POTASSIUM CHLORIDE CRYS ER 20 MEQ PO TBCR: 20.0000 meq | EXTENDED_RELEASE_TABLET | Freq: Every evening | ORAL | 2 refills | Status: DC

## 2017-05-04 MED ORDER — LISINOPRIL 2.5 MG PO TABS: 2.5000 mg | ORAL_TABLET | Freq: Every day | ORAL | 2 refills | Status: DC

## 2017-05-04 MED ORDER — GLIPIZIDE ER 10 MG PO TB24: 10.0000 mg | ORAL_TABLET | Freq: Every day | ORAL | 2 refills | Status: DC

## 2017-05-04 MED ORDER — ALLOPURINOL 300 MG PO TABS: ORAL_TABLET | ORAL | 2 refills | Status: DC

## 2017-05-04 MED ORDER — FUROSEMIDE 40 MG PO TABS: 120.0000 mg | ORAL_TABLET | Freq: Two times a day (BID) | ORAL | 2 refills | Status: DC
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
## 2017-05-08 NOTE — Procedures (Signed)
EMG & NCV Findings: Evaluation of the left median (across palm) sensory nerve showed borderline prolonged distal peak latency (Wrist, 3.7 ms).  All remaining nerves (as indicated in the following tables) were within normal limits.    Needle evaluation of the left biceps muscle showed slightly increased spontaneous activity, and diminished recruitment left triceps muscle showed decreased insertional activity, slightly increased spontaneous activity, increased motor unit amplitude, and diminished recruitment.  All remaining muscles (as indicated in the following table) showed no evidence of electrical instability.    Impression: The above electrodiagnostic study is ABNORMAL and reveals evidence of a very severe chronic C6 radiculopathy on the left.  Diagnostically he represents a significant challenge with history of diabetes as well as prior left arm injury status post surgery in 1991 when 2000 pounds of steel landed on his arm.  He clearly has atrophy of the left triceps muscle with confirmation on electrodiagnostic study.  It appears to be isolated predominantly to C6 without really any findings at C5 or C7.  There would be some question of nerve root avulsion as he does have intact sensory nerve conductions.  There is no significant electrodiagnostic evidence of any other focal nerve entrapment or generalized peripheral neuropathy.   Recommendations: 1.  Follow-up with referring physician. 2.  Continue current management of symptoms. 3.  Suggest surgical evaluation.  Cervical myelogram may help further in the diagnosis and management.  The patient cannot have MRI with pacemaker.  He is also anticoagulated.     Nerve Conduction Studies Anti Sensory Summary Table   Stim Site NR Peak (ms) Norm Peak (ms) P-T Amp (V) Norm P-T Amp Site1 Site2 Delta-P (ms) Dist (cm) Vel (m/s) Norm Vel (m/s)  Left Median Acr Palm Anti Sensory (2nd Digit)  31.8C  Wrist    *3.7 <3.6 15.3 >10 Wrist Palm 1.7 0.0      Palm    2.0 <2.0 27.6         Left Radial Anti Sensory (Base 1st Digit)  33.8C  Wrist    2.2 <3.1 15.4  Wrist Base 1st Digit 2.2 0.0    Left Ulnar Anti Sensory (5th Digit)  32.5C  Wrist    3.5 <3.7 17.9 >15.0 Wrist 5th Digit 3.5 14.0 40 >38   Motor Summary Table   Stim Site NR Onset (ms) Norm Onset (ms) O-P Amp (mV) Norm O-P Amp Site1 Site2 Delta-0 (ms) Dist (cm) Vel (m/s) Norm Vel (m/s)  Left Median Motor (Abd Poll Brev)  32.6C  Wrist    3.5 <4.2 6.5 >5 Elbow Wrist 4.4 23.5 53 >50  Elbow    7.9  6.0         Left Ulnar Motor (Abd Dig Min)  32.2C  Wrist    3.2 <4.2 7.9 >3 B Elbow Wrist 3.6 22.0 61 >53  B Elbow    6.8  7.2  A Elbow B Elbow 1.5 9.0 60 >53  A Elbow    8.3  6.6            EMG   Side Muscle Nerve Root Ins Act Fibs Psw Amp Dur Poly Recrt Int Fraser Din Comment  Left 1stDorInt Ulnar C8-T1 Nml Nml Nml Nml Nml 0 Nml Nml   Left Abd Poll Brev Median C8-T1 Nml Nml Nml Nml Nml 0 Nml Nml   Left ExtDigCom Post Int C7-8 Nml Nml Nml Nml Nml 0 Nml Nml   Left Triceps Radial C6-7-8 *Decr *1+ *1+ *Incr Nml 0 *Reduced Nml  Left Biceps MscCut C5-6 Nml *1+ *1+ Nml Nml 0 *Reduced Nml   Left Deltoid Axillary C5-6 Nml Nml Nml Nml Nml 0 Nml Nml     Nerve Conduction Studies Anti Sensory Left/Right Comparison   Stim Site L Lat (ms) R Lat (ms) L-R Lat (ms) L Amp (V) R Amp (V) L-R Amp (%) Site1 Site2 L Vel (m/s) R Vel (m/s) L-R Vel (m/s)  Median Acr Palm Anti Sensory (2nd Digit)  31.8C  Wrist *3.7   15.3   Wrist Palm     Palm 2.0   27.6         Radial Anti Sensory (Base 1st Digit)  33.8C  Wrist 2.2   15.4   Wrist Base 1st Digit     Ulnar Anti Sensory (5th Digit)  32.5C  Wrist 3.5   17.9   Wrist 5th Digit 40     Motor Left/Right Comparison   Stim Site L Lat (ms) R Lat (ms) L-R Lat (ms) L Amp (mV) R Amp (mV) L-R Amp (%) Site1 Site2 L Vel (m/s) R Vel (m/s) L-R Vel (m/s)  Median Motor (Abd Poll Brev)  32.6C  Wrist 3.5   6.5   Elbow Wrist 53    Elbow 7.9   6.0         Ulnar Motor  (Abd Dig Min)  32.2C  Wrist 3.2   7.9   B Elbow Wrist 61    B Elbow 6.8   7.2   A Elbow B Elbow 60    A Elbow 8.3   6.6            Waveforms:

## 2017-05-09 ENCOUNTER — Other Ambulatory Visit: Payer: Self-pay

## 2017-05-09 ENCOUNTER — Ambulatory Visit (INDEPENDENT_AMBULATORY_CARE_PROVIDER_SITE_OTHER): Payer: Medicare Other | Admitting: *Deleted

## 2017-05-09 DIAGNOSIS — I4891 Unspecified atrial fibrillation: Secondary | ICD-10-CM

## 2017-05-09 DIAGNOSIS — I483 Typical atrial flutter: Secondary | ICD-10-CM | POA: Diagnosis not present

## 2017-05-09 DIAGNOSIS — Z7901 Long term (current) use of anticoagulants: Secondary | ICD-10-CM

## 2017-05-09 LAB — POCT INR: INR: 2.2

## 2017-05-09 NOTE — Patient Outreach (Signed)
[facility]) Care Management  05/09/2017  [name] 07-13-1945 [id number]   Telephone call to the patient for monthly assessment.  Wife answered the phone and the patient is not at home.  HIPAA compliant message left with the contact information.  RN Health Coach will  attempt outreach to the patient in three to four business days.   [name] RN, BSN, [place] Direct Dial:  [phone]  Fax: [phone]

## 2017-05-09 NOTE — Telephone Encounter (Signed)
This encounter was created in error - please disregard.

## 2017-05-09 NOTE — Patient Instructions (Signed)
Continue coumadin 1/2 tablet daily except 1 tablet Mondays, Wednesdays and Fridays Recheck in 4 weeks

## 2017-05-14 ENCOUNTER — Encounter (INDEPENDENT_AMBULATORY_CARE_PROVIDER_SITE_OTHER): Payer: Self-pay | Admitting: Orthopaedic Surgery

## 2017-05-14 ENCOUNTER — Other Ambulatory Visit (INDEPENDENT_AMBULATORY_CARE_PROVIDER_SITE_OTHER): Payer: Self-pay | Admitting: *Deleted

## 2017-05-14 ENCOUNTER — Ambulatory Visit (INDEPENDENT_AMBULATORY_CARE_PROVIDER_SITE_OTHER): Payer: Medicare Other | Admitting: Orthopaedic Surgery

## 2017-05-14 VITALS — BP 111/64 | HR 79 | Temp 97.8°F | Ht 71.0 in | Wt 255.0 lb

## 2017-05-14 DIAGNOSIS — M5412 Radiculopathy, cervical region: Secondary | ICD-10-CM

## 2017-05-14 DIAGNOSIS — M542 Cervicalgia: Secondary | ICD-10-CM
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
## 2017-05-17 ENCOUNTER — Telehealth: Payer: Self-pay | Admitting: Cardiology

## 2017-05-17 ENCOUNTER — Other Ambulatory Visit: Payer: Self-pay

## 2017-05-17 NOTE — Telephone Encounter (Signed)
   [facility] Pre-operative Risk Assessment    Request for surgical clearance:  1. What type of surgery is being performed? Cervical myelogram    2. When is this surgery scheduled? TBD   3. What type of clearance is required (medical clearance vs. Pharmacy clearance to hold med vs. Both)? Pharmacy   4. Are there any medications that need to be held prior to surgery and how long? Warfarin - 4 days prior   5. Practice name and name of physician performing surgery? [facility]   6. What is your office phone number [phone])   7.   What is your office fax number [phone])  8.   Anesthesia type (None, local, MAC, general) ? None specified    [name] 05/17/2017, 2:44 PM  _________________________________________________________________   (provider comments below)

## 2017-05-17 NOTE — Telephone Encounter (Signed)
Patient with diagnosis of atrial fibrillation on warfarin for anticoagulation.    Procedure: cervical myelogram Date of procedure: TBD  CHADS2-VASc score of  5 (CHF, HTN, AGE, DM2, CAD, )  CrCl 102.7 Platelet count 153  Per office protocol, patient can hold warfarin for 4 days prior to procedure.    Patient will not need bridging with Lovenox (enoxaparin) around procedure.  Patient should restart warfarin 24-72 hours after procedure, at discretion of procedure MD

## 2017-05-17 NOTE — Patient Outreach (Signed)
[facility]) Care Management  [place]  05/17/2017   [name] 03/24/1945 [id number]  Subjective: Telephone call to the patient for monthly assessment.  HIPAA verified.  The patient states that he is doing ok.  He denies any falls and medication adherence.  The patient states that he having pain in his arm and hand .  He has seen his physician and is scheduled to have a test to check for a pinched nerve or bone spurs. He states that his physician gave him instructions on using his arm and hand .  RN Health Coach discussed importance of following instructions.  The patient verbalized understanding The patient checked his blood sugar today it was 200.  He states that he ate some candy the night before.   He states his blood sugar usually range between 120-150.  RN Myrle Sheng Coach reviewed his food intake.  The patient verbalized understanding. The patient states that he is active in the home.  The patient states that he only checks his weight once weekly.  RN Health Coach discussed with patient the importance of daily weight monitoring and when to notify the physician of weight gain, encouraged patient to weigh daily.  The patient verbalized understanding.  The patient states that he has a follow up appointment with his physician for recheck for his diabetes in June.   Medications:  Outpatient Encounter Medications as of 05/17/2017  Medication Sig Note  . albuterol (PROVENTIL HFA;VENTOLIN HFA) 108 (90 Base) MCG/ACT inhaler Inhale 2 puffs into the lungs every 6 (six) hours as needed for wheezing or shortness of breath.   . allopurinol (ZYLOPRIM) 300 MG tablet take 1 tablet by mouth once daily   . atorvastatin (LIPITOR) 80 MG tablet Take 1 tablet (80 mg total) by mouth daily.   . Blood Glucose Monitoring Suppl (ONE TOUCH ULTRA 2) w/Device KIT Use as directed   . carvedilol (COREG) 25 MG tablet Take 1 tablet (25 mg total) by mouth 2 (two) times daily.   . Cholecalciferol  (VITAMIN D) 1000 UNITS capsule Take 1,000 Units by mouth 2 (two) times daily. Reported on 02/15/2015   . donepezil (ARICEPT) 5 MG tablet Take 1 tablet (5 mg total) by mouth at bedtime.   . fluocinonide cream (LIDEX) 3.33 % Apply 1 application topically 2 (two) times daily. 05/18/2016: Use prn   . fluticasone (FLONASE) 50 MCG/ACT nasal spray USE 2 SPRAYS IN EACH  NOSTRIL EVERY DAY   . furosemide (LASIX) 40 MG tablet Take 3 tablets (120 mg total) by mouth 2 (two) times daily.   Marland Kitchen gabapentin (NEURONTIN) 300 MG capsule TAKE 2 CAPSULES BY MOUTH 3  TIMES DAILY (Patient taking differently: TAKE 1 CAPSULE BY MOUTH 3  TIMES DAILY)   . glipiZIDE (GLUCOTROL XL) 10 MG 24 hr tablet Take 1 tablet (10 mg total) by mouth daily with breakfast.   . HYDROcodone-acetaminophen (NORCO) 10-325 MG tablet Take 1 tablet by mouth daily as needed.   Marland Kitchen lisinopril (PRINIVIL,ZESTRIL) 2.5 MG tablet Take 1 tablet (2.5 mg total) by mouth daily.   . metFORMIN (GLUCOPHAGE) 500 MG tablet TAKE 2 TABLETS BY MOUTH TWO TIMES DAILY   . methocarbamol (ROBAXIN) 500 MG tablet Take 1 tablet (500 mg total) by mouth every 8 (eight) hours as needed for muscle spasms.   . Multiple Vitamin (MULTIVITAMIN WITH MINERALS) TABS tablet Take 1 tablet by mouth every morning.   . ONE TOUCH ULTRA TEST test strip CHECK BLOOD SUGAR TWO TIMES DAILY   .  ONETOUCH DELICA LANCETS 00F MISC USE AS DIRECTED THREE TIMES DAILY TO CHECK BLOOD SUGAR   . oxybutynin (DITROPAN XL) 10 MG 24 hr tablet Take 1 tablet (10 mg total) by mouth at bedtime.   . potassium chloride SA (K-DUR,KLOR-CON) 20 MEQ tablet Take 1 tablet (20 mEq total) by mouth every evening.   . sitaGLIPtin (JANUVIA) 100 MG tablet Take 1 tablet (100 mg total) by mouth daily.   . tamsulosin (FLOMAX) 0.4 MG CAPS capsule TAKE 1 CAPSULE BY MOUTH  EVERY 12 HOURS   . tiZANidine (ZANAFLEX) 4 MG tablet TAKE 1 TABLET BY MOUTH  EVERY 6 HOURS AS NEEDED FOR MUSCLE SPASMS.   Marland Kitchen traMADol (ULTRAM) 50 MG tablet Take 1 tablet (50  mg total) by mouth every 8 (eight) hours as needed.   . warfarin (COUMADIN) 5 MG tablet Take 1/2 tablet daily except 1 tablet on Mondays, Wednesdays and Fridays   . predniSONE (DELTASONE) 10 MG tablet 3 tabs by mouth per day for 3 days,2tabs per day for 3 days,1tab per day for 3 days (Patient not taking: Reported on 05/17/2017)    No facility-administered encounter medications on file as of 05/17/2017.     Functional Status:  In your present state of health, do you have any difficulty performing the following activities: 08/23/2016  Hearing? 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 Y  Difficulty concentrating or making decisions? N  Walking or climbing stairs? N  Dressing or bathing? N  Doing errands, shopping? N  Preparing Food and eating ? N  Using the Toilet? N  In the past six months, have you accidently leaked urine? N  Do you have problems with loss of bowel control? N  Managing your Medications? N  Managing your Finances? N  Housekeeping or managing your Housekeeping? N  Some recent data might be hidden    Fall/Depression Screening: Fall Risk  05/03/2017 02/27/2017 02/01/2017  Falls in the past year? No No No  Number falls in past yr: - - -  Comment - - -  Injury with Fall? - - -  Risk Factor Category  - - -  Comment - - -  Risk for fall due to : - - -  Follow up - - -   PHQ 2/9 Scores 02/01/2017 10/13/2016 09/22/2016 08/23/2016 05/05/2016 03/28/2016 12/14/2015  PHQ - 2 Score 1 0 0 0 0 0 0   Assessment: Patient continues to benefit from health coach outreach for disease management and support.    THN CM Care Plan Problem One     Most Recent Value  THN Long Term Goal   Patient will continue to keep blood sugars less than 150 within the next 90 days.   THN Long Term Goal Start Date  05/17/17  Interventions for Problem One Long Term Goal  Reviewed goals for fasting blood sugars and discussed food intake  THN CM Short Term Goal #1   Patient will weigh daily in the next 30 days   THN CM Short Term Goal #1 Start Date  05/17/17  Interventions for Short Term Goal #1  Discussed with the patient the imprtance of weighing daily for his condition  THN CM Short Term Goal #2   In next 30 days the patient will verbalize following the instructions of his physician regarding the use of his arm and hand  THN CM Short Term Goal #2 Start Date  05/17/17  Interventions for Short Term Goal #2  Discussed compliance with instructions given  by his physician     Plan:  [place] will contact patient in the month of May and patient agrees to next outreach.   [name] RN, BSN, [place] Direct Dial:  [phone]  Fax: [phone]

## 2017-05-21 ENCOUNTER — Telehealth: Payer: Self-pay | Admitting: Internal Medicine

## 2017-05-21 MED ORDER — ALBUTEROL SULFATE HFA 108 (90 BASE) MCG/ACT IN AERS: 2.0000 | INHALATION_SPRAY | Freq: Four times a day (QID) | RESPIRATORY_TRACT | 2 refills | Status: DC | PRN

## 2017-05-21 NOTE — Telephone Encounter (Signed)
Copied from [place] [id number]. Topic: Quick Communication - Rx Refill/Question >> May 21, 2017  9:24 AM Lennox Solders wrote: Medication: proair inhaler Has the patient contacted their pharmacy? Yes (Agent: If no, request that the patient contact the pharmacy for the refill.) Preferred Pharmacy (with phone number or street name):[facility] [phone] . Pt received a letter that md must call [facility] concerning proair inhaler

## 2017-05-23 ENCOUNTER — Ambulatory Visit (INDEPENDENT_AMBULATORY_CARE_PROVIDER_SITE_OTHER): Payer: Medicare Other | Admitting: Orthopaedic Surgery

## 2017-05-24 ENCOUNTER — Telehealth: Payer: Self-pay | Admitting: Internal Medicine

## 2017-05-24 ENCOUNTER — Telehealth: Payer: Self-pay | Admitting: *Deleted

## 2017-05-24 MED ORDER — ONETOUCH DELICA LANCETS 33G MISC: 0 refills | Status: AC

## 2017-05-24 MED ORDER — GLUCOSE BLOOD VI STRP: ORAL_STRIP | 3 refills | Status: AC

## 2017-05-24 NOTE — Telephone Encounter (Signed)
Pt is scheduled for a cervical myelogram by Dr Lorin Mercy on 05/31/17.  He was told to take last dose of coumadin on 05/25/17 and restart coumadin after procedure when cleared by Dr Lorin Mercy.  INR appt moved to 06/11/17.

## 2017-05-24 NOTE — Telephone Encounter (Signed)
Pt is needing to speak w/ you about his upcoming apt. He's having a procedure done on the 5/2 and he's going to have to hold his coumadin

## 2017-05-24 NOTE — Telephone Encounter (Signed)
Copied from [place] [phone]. Topic: Quick Communication - Rx Refill/Question >> May 24, 2017 10:20 AM Lennox Solders wrote: Medication: one touch ultra blue testing strips and delica lancets 33 g.  Has the patient contacted their pharmacy? Yes [facility] needs directions and qty for 90 day supply of each  Preferred Pharmacy (with phone number or street name): [facility] [phone] reference # [id number] Agent: Please be advised that RX refills may take up to 3 business days. We ask that you follow-up with your pharmacy.

## 2017-05-25 ENCOUNTER — Telehealth: Payer: Self-pay | Admitting: Internal Medicine

## 2017-05-25 ENCOUNTER — Ambulatory Visit: Payer: Medicare Other | Admitting: Podiatry

## 2017-05-25 ENCOUNTER — Ambulatory Visit (INDEPENDENT_AMBULATORY_CARE_PROVIDER_SITE_OTHER): Payer: Medicare Other | Admitting: Internal Medicine

## 2017-05-25 ENCOUNTER — Encounter: Payer: Self-pay | Admitting: Internal Medicine

## 2017-05-25 VITALS — BP 122/76 | HR 76 | Temp 97.8°F | Ht 71.0 in | Wt 215.0 lb

## 2017-05-25 DIAGNOSIS — E1140 Type 2 diabetes mellitus with diabetic neuropathy, unspecified: Secondary | ICD-10-CM

## 2017-05-25 DIAGNOSIS — M109 Gout, unspecified: Secondary | ICD-10-CM

## 2017-05-25 DIAGNOSIS — E1165 Type 2 diabetes mellitus with hyperglycemia: Secondary | ICD-10-CM | POA: Diagnosis not present

## 2017-05-25 DIAGNOSIS — E7800 Pure hypercholesterolemia, unspecified: Secondary | ICD-10-CM

## 2017-05-25 DIAGNOSIS — IMO0002 Reserved for concepts with insufficient information to code with codable children: Secondary | ICD-10-CM

## 2017-05-25 DIAGNOSIS — M5412 Radiculopathy, cervical region: Secondary | ICD-10-CM

## 2017-05-25 LAB — POCT GLYCOSYLATED HEMOGLOBIN (HGB A1C): Hemoglobin A1C: 7

## 2017-05-25 MED ORDER — METHYLPREDNISOLONE ACETATE 80 MG/ML IJ SUSP
80.0000 mg | Freq: Once | INTRAMUSCULAR | Status: AC
Start: 2017-05-25 — End: 2017-05-25
  Administered 2017-05-25: 80 mg via INTRAMUSCULAR

## 2017-05-25 MED ORDER — SITAGLIPTIN PHOSPHATE 100 MG PO TABS: 100.0000 mg | ORAL_TABLET | Freq: Every day | ORAL | 3 refills | Status: DC

## 2017-05-25 MED ORDER — PREDNISONE 10 MG PO TABS: ORAL_TABLET | ORAL | 0 refills | Status: DC

## 2017-05-25 MED ORDER — HYDROCODONE-ACETAMINOPHEN 10-325 MG PO TABS: 1.0000 | ORAL_TABLET | Freq: Every day | ORAL | 0 refills | Status: DC | PRN

## 2017-05-25 NOTE — Patient Instructions (Addendum)
Your A1c was [employer] had the steroid shot today  Please take all new medication as prescribed - the prednisone  Please continue all other medications as before, and refills have been done if requested. - the hydrocodone  Please have the pharmacy call with any other refills you may need.  Please continue your efforts at being more active, low cholesterol diabetic diet, and weight control.  Please keep your appointments with your specialists as you may have planned  Please return in July 3 as scheduled, or sooner if needed

## 2017-05-25 NOTE — Addendum Note (Signed)
Addended by: Biagio Borg on: 05/25/2017 12:52 PM   Modules accepted: Orders

## 2017-05-25 NOTE — Progress Notes (Addendum)
Subjective:    Patient ID: [name], male    DOB: 26-Jan-1946, 72 y.o.   MRN: [id number]  HPI  Here to f/u; overall doing ok,  Pt denies chest pain, increasing sob or doe, wheezing, orthopnea, PND, increased LE swelling, palpitations, dizziness or syncope.  Pt denies new neurological symptoms such as new headache, or facial or extremity weakness or numbness.  Pt denies polydipsia, polyuria, or low sugar episode.  Pt states overall good compliance with meds, mostly trying to follow appropriate diet, with wt overall stable.  Does have marked right 5th finger PIP red, hot, swelling, tender without fever, trauma or dog bite (has a biting dog at home), mod to severe for 1 wk, cannot use the finger but fortunately can work around it, walks with cane in other hand.  No falls.  Does have chronic persistent neuritic pain and numbness to the left c6 cervical distribution - now for c-spine myelogram soon per surgury.   Past Medical History:  Diagnosis Date  . Aortic root dilatation ([place]) 02/02/2011  . Arthritis    "lower back; going back down both my sciatic nerves"  . 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 BENIGN PROSTATIC HYPERTROPHY 08/29/2006   takes Flomax daily  . 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 Cataracts, bilateral    immature  . CHF (congestive heart failure) ([facility])    takes Lasix daily  . Chronic back pain    HNP   . CORONARY ARTERY DISEASE 08/29/2006   takes Coumadin daily  . DEPRESSION 08/29/2006  . DIABETES MELLITUS, TYPE II 08/29/2006   takes Metformin,Januvia,and Glipizide  daily  . DIASTOLIC HEART FAILURE, CHRONIC 06/09/2008  . GOUT 04/22/2007   takes Allopurinol daily  . History of migraine    42yr ago  . HYPERLIPIDEMIA 08/29/2006   takes Atorvastatin daily  . HYPERTENSION 08/29/2006   takes Lisinopril daily  . INSOMNIA-SLEEP DISORDER-UNSPEC 10/23/2007  . Left lumbar radiculopathy 05/30/2010  . LUMBAR RADICULOPATHY, RIGHT 06/10/2007  . Muscle spasm    takes  Zanaflex daily  . [place]   "I've had several MIs"  . 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 PACEMAKER, PERMANENT 03/04/2008   pt denies this date  . Peripheral neuropathy    takes Gabapentin daily  . Presence of permanent cardiac pacemaker   . Shortness of breath dyspnea    "all my life" with exertion  . Sleep apnea    "if I lay flat I quit breathing; HOB up I'm fine"   Past Surgical History:  Procedure Laterality Date  . COLONOSCOPY    . CORONARY ANGIOPLASTY WITH STENT PLACEMENT  12/27/10   "I've had a total of 9 cardiac stents put in"  . CORONARY ARTERY BYPASS GRAFT  1992   CABG X 2  . ESOPHAGOGASTRODUODENOSCOPY N/A 12/01/2013   Procedure: ESOPHAGOGASTRODUODENOSCOPY (EGD);  Surgeon: DLafayette Dragon MD;  [facility];  Service: Endoscopy;  Laterality: N/A;  . INSERT / REPLACE / REMOVE PACEMAKER  ~ 2004   initial pacemaker placement  . INSERT / REPLACE / REMOVE PACEMAKER  10/2009   generator change  . LUMBAR LAMINECTOMY/DECOMPRESSION MICRODISCECTOMY Right 03/23/2014   Procedure: LAMINECTOMY AND FORAMINOTOMY RIGHT LUMBAR THREE-FOUR,LUMBAR FOUR-FIVE, LUMBAR FIVE-SACRAL ONE;  Surgeon: HCharlie Pitter MD;  [facility];  Service: Neurosurgery;  Laterality: Right;  right  . LUMBAR LAMINECTOMY/DECOMPRESSION MICRODISCECTOMY Left 12/01/2014   Procedure: Left Lumbar Three-Four, Lumbar Four-Five Laminectomy and Foraminotomy;  Surgeon: HEarnie Larsson MD;  Location:  [facility];  Service: Neurosurgery;  Laterality: Left;  . s/p left arm surgury after work accident  1991   "2000# steel fell on it"  . s/p right hand surgury for foreign object  1970's   "piece of wood went in my hand; had to get that out"    reports that he quit smoking about 44 years ago. His smoking use included cigarettes. He has a 27.00 pack-year smoking history. He has quit using smokeless tobacco. He reports that he does not drink alcohol or use drugs. family history includes  Coronary artery disease (age of onset: 53) in his other; Diabetes in his mother, other, and sister; Heart disease in his sister and sister; Lung cancer in his sister. Allergies  Allergen Reactions  . Crestor [Rosuvastatin Calcium] Other (See Comments)    Urination of blood  . Latex Rash and Other (See Comments)    Gloves cause welts on hands!!   Current Outpatient Medications on File Prior to Visit  Medication Sig Dispense Refill  . albuterol (PROVENTIL HFA;VENTOLIN HFA) 108 (90 Base) MCG/ACT inhaler Inhale 2 puffs into the lungs every 6 (six) hours as needed for wheezing or shortness of breath. 1 Inhaler 2  . allopurinol (ZYLOPRIM) 300 MG tablet take 1 tablet by mouth once daily 90 tablet 2  . atorvastatin (LIPITOR) 80 MG tablet Take 1 tablet (80 mg total) by mouth daily. 540 tablet 3  . Blood Glucose Monitoring Suppl (ONE TOUCH ULTRA 2) w/Device KIT Use as directed 1 each 3  . carvedilol (COREG) 25 MG tablet Take 1 tablet (25 mg total) by mouth 2 (two) times daily. 180 tablet 2  . Cholecalciferol (VITAMIN D) 1000 UNITS capsule Take 1,000 Units by mouth 2 (two) times daily. Reported on 02/15/2015    . donepezil (ARICEPT) 5 MG tablet Take 1 tablet (5 mg total) by mouth at bedtime. 90 tablet 2  . fluocinonide cream (LIDEX) 3.82 % Apply 1 application topically 2 (two) times daily. 30 g 2  . fluticasone (FLONASE) 50 MCG/ACT nasal spray USE 2 SPRAYS IN EACH  NOSTRIL EVERY DAY 48 g 0  . furosemide (LASIX) 40 MG tablet Take 3 tablets (120 mg total) by mouth 2 (two) times daily. 540 tablet 2  . gabapentin (NEURONTIN) 300 MG capsule TAKE 2 CAPSULES BY MOUTH 3  TIMES DAILY (Patient taking differently: TAKE 1 CAPSULE BY MOUTH 3  TIMES DAILY) 540 capsule 3  . glipiZIDE (GLUCOTROL XL) 10 MG 24 hr tablet Take 1 tablet (10 mg total) by mouth daily with breakfast. 90 tablet 2  . glucose blood (ONE TOUCH ULTRA TEST) test strip CHECK BLOOD SUGAR TWO TIMES DAILY 100 each 3  . lisinopril (PRINIVIL,ZESTRIL) 2.5 MG  tablet Take 1 tablet (2.5 mg total) by mouth daily. 90 tablet 2  . metFORMIN (GLUCOPHAGE) 500 MG tablet TAKE 2 TABLETS BY MOUTH TWO TIMES DAILY 360 tablet 2  . methocarbamol (ROBAXIN) 500 MG tablet Take 1 tablet (500 mg total) by mouth every 8 (eight) hours as needed for muscle spasms. 90 tablet 2  . Multiple Vitamin (MULTIVITAMIN WITH MINERALS) TABS tablet Take 1 tablet by mouth every morning.    Glory Rosebush DELICA LANCETS 50N MISC USE AS DIRECTED THREE TIMES DAILY TO CHECK BLOOD SUGAR 300 each 0  . oxybutynin (DITROPAN XL) 10 MG 24 hr tablet Take 1 tablet (10 mg total) by mouth at bedtime. 90 tablet 3  . potassium chloride SA (K-DUR,KLOR-CON) 20 MEQ tablet Take 1 tablet (20 mEq total)  by mouth every evening. 90 tablet 2  . tamsulosin (FLOMAX) 0.4 MG CAPS capsule TAKE 1 CAPSULE BY MOUTH  EVERY 12 HOURS 180 capsule 2  . tiZANidine (ZANAFLEX) 4 MG tablet TAKE 1 TABLET BY MOUTH  EVERY 6 HOURS AS NEEDED FOR MUSCLE SPASMS. 180 tablet 2  . traMADol (ULTRAM) 50 MG tablet Take 1 tablet (50 mg total) by mouth every 8 (eight) hours as needed. 60 tablet 0  . warfarin (COUMADIN) 5 MG tablet Take 1/2 tablet daily except 1 tablet on Mondays, Wednesdays and Fridays 90 tablet 3   No current facility-administered medications on file prior to visit.    Review of Systems  Constitutional: Negative for other unusual diaphoresis or sweats HENT: Negative for ear discharge or swelling Eyes: Negative for other worsening visual disturbances Respiratory: Negative for stridor or other swelling  Gastrointestinal: Negative for worsening distension or other blood Genitourinary: Negative for retention or other urinary change Musculoskeletal: Negative for other MSK pain or swelling Skin: Negative for color change or other new lesions Neurological: Negative for worsening tremors and other numbness  Psychiatric/Behavioral: Negative for worsening agitation or other fatigue All other system neg per pt    Objective:    Physical Exam BP 122/76   Pulse 76   Temp 97.8 F (36.6 C) (Oral)   Ht '5\' 11"'  (1.803 m)   Wt 215 lb (97.5 kg)   SpO2 93%   BMI 29.99 kg/m  VS noted,  Constitutional: Pt appears in NAD HENT: Head: NCAT.  Right Ear: External ear normal.  Left Ear: External ear normal.  Eyes: . Pupils are equal, round, and reactive to light. Conjunctivae and EOM are normal Nose: without d/c or deformity Neck: Neck supple. Gross normal ROM Cardiovascular: Normal rate and regular rhythm.   Pulmonary/Chest: Effort normal and breath sounds without rales or wheezing.  Abd:  Soft, NT, ND, + BS, no organomegaly Neurological: Pt is alert. At baseline orientation, motor grossly intact Skin: Skin is warm. No rashes, other new lesions, no LE edema Psychiatric: Pt behavior is normal without agitation  No other exam findings  Lab Results  Component Value Date   HGBA1C 6.8 02/01/2017   Today - POCT A1c   - 7.0 POCT glycosylated hemoglobin (Hb A1C)  Component 09:00  Hemoglobin A1C 7.0            Assessment & Plan:

## 2017-05-25 NOTE — Assessment & Plan Note (Signed)
Lab Results  Component Value Date   LDLCALC 42 10/17/2016  stable overall by history and exam, recent data reviewed with pt, and pt to continue medical treatment as before,  to f/u any worsening symptoms or concerns

## 2017-05-25 NOTE — Assessment & Plan Note (Signed)
Point for hydrocodone prn refill, f/u ct myelogram as planned

## 2017-05-25 NOTE — Assessment & Plan Note (Signed)
stable overall by history and exam, recent data reviewed with pt, and pt to continue medical treatment as before,  to f/u any worsening symptoms or concerns, a1c ok today, f/u next visit as planned

## 2017-05-25 NOTE — Assessment & Plan Note (Signed)
With flare to right hand - for depomedrol IM 80, predpac asd

## 2017-05-25 NOTE — Telephone Encounter (Signed)
Pt states her PREDNISONE was not called in,  Please send to [facility]

## 2017-05-25 NOTE — Telephone Encounter (Signed)
Done erx  apaprently I tried to send a cancelled previous rx but did not work

## 2017-05-28 ENCOUNTER — Telehealth: Payer: Self-pay | Admitting: *Deleted

## 2017-05-28 ENCOUNTER — Other Ambulatory Visit: Payer: Self-pay | Admitting: Internal Medicine

## 2017-05-28 NOTE — Telephone Encounter (Signed)
Called and spoke with pt.  States he needs to have INR checked before procedure on 5/2.  Michela Pitcher he came by here and was told I wasn't here on Wednesdays.  Told him I would be here on 5/1 and would be glad to check his INR.  States he already arranged to get it checked in [place] and will just keep appt there.  I appologized for the misunderstanding.

## 2017-05-28 NOTE — Telephone Encounter (Signed)
Needing to have coumadin checked prior to his procedure. Please give him a call

## 2017-05-30 ENCOUNTER — Ambulatory Visit: Payer: Medicare Other | Admitting: Urology

## 2017-05-30 ENCOUNTER — Ambulatory Visit (INDEPENDENT_AMBULATORY_CARE_PROVIDER_SITE_OTHER): Payer: Medicare Other | Admitting: Pharmacist

## 2017-05-30 DIAGNOSIS — Z7901 Long term (current) use of anticoagulants: Secondary | ICD-10-CM

## 2017-05-30 DIAGNOSIS — I483 Typical atrial flutter: Secondary | ICD-10-CM | POA: Diagnosis not present

## 2017-05-30 DIAGNOSIS — I4891 Unspecified atrial fibrillation: Secondary | ICD-10-CM

## 2017-05-30 LAB — POCT INR: INR: 1.3

## 2017-05-31 ENCOUNTER — Ambulatory Visit
Admission: RE | Admit: 2017-05-31 | Discharge: 2017-05-31 | Disposition: A | Discharge status: Home / Self Care | Payer: Medicare Other | Source: Ambulatory Visit | Attending: Orthopaedic Surgery | Admitting: Orthopaedic Surgery

## 2017-05-31 VITALS — BP 146/84 | HR 65

## 2017-05-31 DIAGNOSIS — M542 Cervicalgia: Secondary | ICD-10-CM

## 2017-05-31 DIAGNOSIS — M5412 Radiculopathy, cervical region: Secondary | ICD-10-CM

## 2017-05-31 DIAGNOSIS — M4802 Spinal stenosis, cervical region: Secondary | ICD-10-CM | POA: Diagnosis not present

## 2017-05-31 MED ORDER — DIAZEPAM 5 MG PO TABS
5.0000 mg | ORAL_TABLET | Freq: Once | ORAL | Status: AC
  Administered 2017-05-31: 5 mg via ORAL

## 2017-05-31 MED ORDER — ONDANSETRON HCL 4 MG/2ML IJ SOLN
4.0000 mg | Freq: Once | INTRAMUSCULAR | Status: AC
  Administered 2017-05-31: 4 mg via INTRAMUSCULAR

## 2017-05-31 MED ORDER — MEPERIDINE HCL 100 MG/ML IJ SOLN
75.0000 mg | Freq: Once | INTRAMUSCULAR | Status: AC
  Administered 2017-05-31: 75 mg via INTRAMUSCULAR

## 2017-05-31 MED ORDER — IOPAMIDOL (ISOVUE-M 300) INJECTION 61%
10.0000 mL | Freq: Once | INTRAMUSCULAR | Status: AC | PRN
  Administered 2017-05-31: 10 mL via INTRATHECAL

## 2017-05-31 NOTE — Discharge Instructions (Signed)
Myelogram Discharge Instructions  1. Go home and rest quietly for the next 24 hours.  It is important to lie flat for the next 24 hours.  Get up only to go to the restroom.  You may lie in the bed or on a couch on your back, your stomach, your left side or your right side.  You may have one pillow under your head.  You may have pillows between your knees while you are on your side or under your knees while you are on your back.  2. DO NOT drive today.  Recline the seat as far back as it will go, while still wearing your seat belt, on the way home.  3. You may get up to go to the bathroom as needed.  You may sit up for 10 minutes to eat.  You may resume your normal diet and medications unless otherwise indicated.  Drink lots of extra fluids today and tomorrow.  4. The incidence of headache, nausea, or vomiting is about 5% (one in 20 patients).  If you develop a headache, lie flat and drink plenty of fluids until the headache goes away.  Caffeinated beverages may be helpful.  If you develop severe nausea and vomiting or a headache that does not go away with flat bed rest, call [phone].  5. You may resume normal activities after your 24 hours of bed rest is over; however, do not exert yourself strongly or do any heavy lifting tomorrow. If when you get up you have a headache when standing, go back to bed and force fluids for another 24 hours.  6. Call your physician for a follow-up appointment.  The results of your myelogram will be sent directly to your physician by the following day.  7. If you have any questions or if complications develop after you arrive home, please call [phone].  Discharge instructions have been explained to the patient.  The patient, or the person responsible for the patient, fully understands these instructions.  YOU MAY RESTART YOUR WARFARIN TODAY. YOU MAY RESTART YOUR TRAMADOL TOMORROW 06/01/2017 AT 09:30AM.

## 2017-06-06 ENCOUNTER — Ambulatory Visit (INDEPENDENT_AMBULATORY_CARE_PROVIDER_SITE_OTHER): Payer: Medicare Other | Admitting: Orthopaedic Surgery

## 2017-06-06 ENCOUNTER — Encounter (INDEPENDENT_AMBULATORY_CARE_PROVIDER_SITE_OTHER): Payer: Self-pay | Admitting: Orthopaedic Surgery

## 2017-06-06 VITALS — BP 127/75 | HR 85 | Ht 71.0 in | Wt 210.0 lb

## 2017-06-06 DIAGNOSIS — M5412 Radiculopathy, cervical region: Secondary | ICD-10-CM

## 2017-06-06 DIAGNOSIS — M47812 Spondylosis without myelopathy or radiculopathy, cervical region: Secondary | ICD-10-CM
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
## 2017-06-14 ENCOUNTER — Encounter: Payer: Self-pay | Admitting: Internal Medicine

## 2017-06-14 ENCOUNTER — Ambulatory Visit (INDEPENDENT_AMBULATORY_CARE_PROVIDER_SITE_OTHER): Payer: Medicare Other | Admitting: Internal Medicine

## 2017-06-14 VITALS — BP 118/68 | HR 95 | Temp 98.3°F | Ht 71.0 in | Wt 209.0 lb

## 2017-06-14 DIAGNOSIS — Z7901 Long term (current) use of anticoagulants: Secondary | ICD-10-CM | POA: Diagnosis not present

## 2017-06-14 DIAGNOSIS — E1165 Type 2 diabetes mellitus with hyperglycemia: Secondary | ICD-10-CM | POA: Diagnosis not present

## 2017-06-14 DIAGNOSIS — R0609 Other forms of dyspnea: Secondary | ICD-10-CM

## 2017-06-14 DIAGNOSIS — I10 Essential (primary) hypertension: Secondary | ICD-10-CM

## 2017-06-14 DIAGNOSIS — E1140 Type 2 diabetes mellitus with diabetic neuropathy, unspecified: Secondary | ICD-10-CM | POA: Diagnosis not present

## 2017-06-14 DIAGNOSIS — Z0001 Encounter for general adult medical examination with abnormal findings: Secondary | ICD-10-CM | POA: Diagnosis not present

## 2017-06-14 DIAGNOSIS — IMO0002 Reserved for concepts with insufficient information to code with codable children: Secondary | ICD-10-CM

## 2017-06-14 DIAGNOSIS — N289 Disorder of kidney and ureter, unspecified: Secondary | ICD-10-CM | POA: Diagnosis not present

## 2017-06-14 NOTE — Progress Notes (Signed)
Subjective:    Patient ID: [name], male    DOB: September 01, 1945, 72 y.o.   MRN: [id number]  HPI   Here for wellness and f/u;  Overall doing ok;  Pt denies Chest pain, worsening SOB, DOE, wheezing, orthopnea, PND, worsening LE edema, palpitations, or syncope.  Pt denies neurological change such as new headache, facial or extremity weakness.  Pt denies polydipsia, polyuria, or low sugar symptoms. Pt states overall good compliance with treatment and medications, good tolerability, and has been trying to follow appropriate diet.  Pt denies worsening depressive symptoms, suicidal ideation or panic. No fever, night sweats, wt loss, loss of appetite, or other constitutional symptoms.  Pt states good ability with ADL's, has low fall risk, home safety reviewed and adequate, no other significant changes in hearing or vision, and only occasionally active with exercise. Does also state he felt too bad for the past week to have INR f/u last wk.  C/o "not feeling good" and lack of energy starting last Sun, did not go to church, and has spent this past wk in bed.  Still feels poorly today, although cold and off colored fingers and blurred vision/doubte vision has seemed to have improved.  No fever, no ST, cough, CP, abd pains, dysuria or skin changes though has been more DOE with room to room at home. Still 2 pillow orthopnea  Last stress test 2010 neg per pt.  Sugars running about 137 - 150, not worse this past wk.  He attributes his problem to ? Viral illness vs heart. Current coumadin dosing is 5 mg M-W-F and 2.5 on Sun, Tues, Thur and Sat Past Medical History:  Diagnosis Date  . Aortic root dilatation ([place]) 02/02/2011  . Arthritis    "lower back; going back down both my sciatic nerves"  . 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 BENIGN PROSTATIC HYPERTROPHY 08/29/2006   takes Flomax daily  . 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 Cataracts, bilateral    immature  . CHF (congestive heart  failure) ([facility])    takes Lasix daily  . Chronic back pain    HNP   . CORONARY ARTERY DISEASE 08/29/2006   takes Coumadin daily  . DEPRESSION 08/29/2006  . DIABETES MELLITUS, TYPE II 08/29/2006   takes Metformin,Januvia,and Glipizide  daily  . DIASTOLIC HEART FAILURE, CHRONIC 06/09/2008  . GOUT 04/22/2007   takes Allopurinol daily  . History of migraine    62yr ago  . HYPERLIPIDEMIA 08/29/2006   takes Atorvastatin daily  . HYPERTENSION 08/29/2006   takes Lisinopril daily  . INSOMNIA-SLEEP DISORDER-UNSPEC 10/23/2007  . Left lumbar radiculopathy 05/30/2010  . LUMBAR RADICULOPATHY, RIGHT 06/10/2007  . Muscle spasm    takes Zanaflex daily  . Myocardial infarction ([facility] 12/27/10   "I've had several MIs"  . 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 PACEMAKER, PERMANENT 03/04/2008   pt denies this date  . Peripheral neuropathy    takes Gabapentin daily  . Presence of permanent cardiac pacemaker   . Shortness of breath dyspnea    "all my life" with exertion  . Sleep apnea    "if I lay flat I quit breathing; HOB up I'm fine"   Past Surgical History:  Procedure Laterality Date  . COLONOSCOPY    . CORONARY ANGIOPLASTY WITH STENT PLACEMENT  12/27/10   "I've had a total of 9 cardiac stents put in"  . CORONARY ARTERY BYPASS GRAFT  1992   CABG X 2  . ESOPHAGOGASTRODUODENOSCOPY N/A 12/01/2013  Procedure: ESOPHAGOGASTRODUODENOSCOPY (EGD);  Surgeon: Lafayette Dragon, MD;  Location: [facility];  Service: Endoscopy;  Laterality: N/A;  . INSERT / REPLACE / REMOVE PACEMAKER  ~ 2004   initial pacemaker placement  . INSERT / REPLACE / REMOVE PACEMAKER  10/2009   generator change  . LUMBAR LAMINECTOMY/DECOMPRESSION MICRODISCECTOMY Right 03/23/2014   Procedure: LAMINECTOMY AND FORAMINOTOMY RIGHT LUMBAR THREE-FOUR,LUMBAR FOUR-FIVE, LUMBAR FIVE-SACRAL ONE;  Surgeon: Charlie Pitter, MD;  Location: [facility];  Service: Neurosurgery;  Laterality: Right;  right  . LUMBAR LAMINECTOMY/DECOMPRESSION  MICRODISCECTOMY Left 12/01/2014   Procedure: Left Lumbar Three-Four, Lumbar Four-Five Laminectomy and Foraminotomy;  Surgeon: Earnie Larsson, MD;  Location: [facility];  Service: Neurosurgery;  Laterality: Left;  . s/p left arm surgury after work accident  1991   "2000# steel fell on it"  . s/p right hand surgury for foreign object  1970's   "piece of wood went in my hand; had to get that out"    reports that he quit smoking about 44 years ago. His smoking use included cigarettes. He has a 27.00 pack-year smoking history. He has quit using smokeless tobacco. He reports that he does not drink alcohol or use drugs. family history includes Coronary artery disease (age of onset: 42) in his other; Diabetes in his mother, other, and sister; Heart disease in his sister and sister; Lung cancer in his sister. Allergies  Allergen Reactions  . Crestor [Rosuvastatin Calcium] Other (See Comments)    Urination of blood  . Latex Rash and Other (See Comments)    Gloves cause welts on hands!!   Current Outpatient Medications on File Prior to Visit  Medication Sig Dispense Refill  . albuterol (PROVENTIL HFA;VENTOLIN HFA) 108 (90 Base) MCG/ACT inhaler Inhale 2 puffs into the lungs every 6 (six) hours as needed for wheezing or shortness of breath. 1 Inhaler 2  . allopurinol (ZYLOPRIM) 300 MG tablet take 1 tablet by mouth once daily 90 tablet 2  . atorvastatin (LIPITOR) 80 MG tablet Take 1 tablet (80 mg total) by mouth daily. 540 tablet 3  . Blood Glucose Monitoring Suppl (ONE TOUCH ULTRA 2) w/Device KIT Use as directed 1 each 3  . carvedilol (COREG) 25 MG tablet Take 1 tablet (25 mg total) by mouth 2 (two) times daily. 180 tablet 2  . Cholecalciferol (VITAMIN D) 1000 UNITS capsule Take 1,000 Units by mouth 2 (two) times daily. Reported on 02/15/2015    . donepezil (ARICEPT) 5 MG tablet Take 1 tablet (5 mg total) by mouth at bedtime. 90 tablet 2  . fluocinonide cream (LIDEX) 4.16 % Apply 1 application topically 2  (two) times daily. 30 g 2  . fluticasone (FLONASE) 50 MCG/ACT nasal spray USE 2 SPRAYS IN EACH  NOSTRIL EVERY DAY 48 g 0  . fluticasone (FLONASE) 50 MCG/ACT nasal spray USE 2 SPRAYS IN EACH  NOSTRIL EVERY DAY 48 g 2  . furosemide (LASIX) 40 MG tablet Take 3 tablets (120 mg total) by mouth 2 (two) times daily. 540 tablet 2  . gabapentin (NEURONTIN) 300 MG capsule TAKE 2 CAPSULES BY MOUTH 3  TIMES DAILY (Patient taking differently: TAKE 1 CAPSULE BY MOUTH 3  TIMES DAILY) 540 capsule 3  . glipiZIDE (GLUCOTROL XL) 10 MG 24 hr tablet Take 1 tablet (10 mg total) by mouth daily with breakfast. 90 tablet 2  . glucose blood (ONE TOUCH ULTRA TEST) test strip CHECK BLOOD SUGAR TWO TIMES DAILY 100 each 3  . HYDROcodone-acetaminophen (NORCO) 10-325 MG tablet  Take 1 tablet by mouth daily as needed. 30 tablet 0  . lisinopril (PRINIVIL,ZESTRIL) 2.5 MG tablet Take 1 tablet (2.5 mg total) by mouth daily. 90 tablet 2  . metFORMIN (GLUCOPHAGE) 500 MG tablet TAKE 2 TABLETS BY MOUTH TWO TIMES DAILY 360 tablet 2  . methocarbamol (ROBAXIN) 500 MG tablet Take 1 tablet (500 mg total) by mouth every 8 (eight) hours as needed for muscle spasms. 90 tablet 2  . Multiple Vitamin (MULTIVITAMIN WITH MINERALS) TABS tablet Take 1 tablet by mouth every morning.    Glory Rosebush DELICA LANCETS 77A MISC USE AS DIRECTED THREE TIMES DAILY TO CHECK BLOOD SUGAR 300 each 0  . oxybutynin (DITROPAN XL) 10 MG 24 hr tablet Take 1 tablet (10 mg total) by mouth at bedtime. 90 tablet 3  . potassium chloride SA (K-DUR,KLOR-CON) 20 MEQ tablet Take 1 tablet (20 mEq total) by mouth every evening. 90 tablet 2  . sitaGLIPtin (JANUVIA) 100 MG tablet Take 1 tablet (100 mg total) by mouth daily. 90 tablet 3  . tamsulosin (FLOMAX) 0.4 MG CAPS capsule TAKE 1 CAPSULE BY MOUTH  EVERY 12 HOURS 180 capsule 2  . tiZANidine (ZANAFLEX) 4 MG tablet TAKE 1 TABLET BY MOUTH  EVERY 6 HOURS AS NEEDED FOR MUSCLE SPASMS. 180 tablet 2  . traMADol (ULTRAM) 50 MG tablet Take 1  tablet (50 mg total) by mouth every 8 (eight) hours as needed. 60 tablet 0  . warfarin (COUMADIN) 5 MG tablet Take 1/2 tablet daily except 1 tablet on Mondays, Wednesdays and Fridays 90 tablet 3   No current facility-administered medications on file prior to visit.    Review of Systems Constitutional: Negative for other unusual diaphoresis, sweats, appetite or weight changes HENT: Negative for other worsening hearing loss, ear pain, facial swelling, mouth sores or neck stiffness.   Eyes: Negative for other worsening pain, redness.  Respiratory: Negative for other stridor or swelling Cardiovascular: Negative for other palpitations or other chest pain  Gastrointestinal: Negative for worsening diarrhea or loose stools, blood in stool, distention or other pain Genitourinary: Negative for hematuria, flank pain or other change in urine volume.  Musculoskeletal: Negative for myalgias or other joint swelling. though does have going left neck pain with known chronic radculopathy Skin: Negative for other color change, or other wound or worsening drainage.  Neurological: Negative for other syncope or numbness. Hematological: Negative for other adenopathy or swelling Psychiatric/Behavioral: Negative for hallucinations, other worsening agitation, SI, self-injury, or new decreased concentration All other system neg per pt    Objective:   Physical Exam BP 118/68   Pulse 95   Temp 98.3 F (36.8 C) (Oral)   Ht 5' 11" (1.803 m)   Wt 209 lb (94.8 kg)   SpO2 98%   BMI 29.15 kg/m  VS noted,  Constitutional: Pt is oriented to person, place, and time. Appears well-developed and well-nourished, in no significant distress and comfortable Head: Normocephalic and atraumatic  Eyes: Conjunctivae and EOM are normal. Pupils are equal, round, and reactive to light Right Ear: External ear normal without discharge Left Ear: External ear normal without discharge Nose: Nose without discharge or  deformity Mouth/Throat: Oropharynx is without other ulcerations and moist  Neck: Normal range of motion. Neck supple. No JVD present. No tracheal deviation present or significant neck LA or mass Cardiovascular: Normal rate, regular rhythm, normal heart sounds and intact distal pulses.   Pulmonary/Chest: WOB normal and breath sounds without rales or wheezing  Abdominal: Soft. Bowel sounds are normal.  NT. No HSM  Musculoskeletal: Normal range of motion. Exhibits no edema Lymphadenopathy: Has no other cervical adenopathy.  Neurological: Pt is alert and oriented to person, place, and time. Pt has normal reflexes. No cranial nerve deficit. Motor grossly intact, Gait intact Skin: Skin is warm and dry. No rash noted or new ulcerations Psychiatric:  Has normal mood and affect. Behavior is normal without agitation No other exam findings Lab Results  Component Value Date   WBC 5.9 02/28/2017   HGB 10.5 (L) 02/28/2017   HCT 32.2 (L) 02/28/2017   PLT 153.0 02/28/2017   GLUCOSE 138 (H) 03/28/2017   CHOL 101 10/17/2016   TRIG 60.0 10/17/2016   HDL 46.80 10/17/2016   LDLCALC 42 10/17/2016   ALT 30 03/16/2017   AST 25 03/16/2017   NA 142 03/28/2017   K 4.8 03/28/2017   CL 99 03/28/2017   CREATININE 1.08 03/28/2017   BUN 22 03/28/2017   CO2 28 03/28/2017   TSH 1.23 03/28/2016   PSA 3.06 06/15/2015   INR 1.3 05/30/2017   HGBA1C 7.0 05/25/2017   MICROALBUR 1.3 03/28/2016   Declines ECG today as he is on his way to [place] with family so another person can see his cardiologist there later today; states will return tomorrow for cxr, labs    Assessment & Plan:

## 2017-06-14 NOTE — Patient Instructions (Signed)
Please continue all other medications as before, and refills have been done if requested.  Please have the pharmacy call with any other refills you may need.  Please continue your efforts at being more active, low cholesterol diet, and weight control.  You are otherwise up to date with prevention measures today.  Please keep your appointments with your specialists as you may have planned  You will be contacted regarding the referral for: cardiology  Please go to the XRAY Department in the Basement (go straight as you get off the elevator) for the x-ray testing  Please go to the LAB in the Basement (turn left off the elevator) for the tests to be done today  You will be contacted by phone if any changes need to be made immediately.  Otherwise, you will receive a letter about your results with an explanation, but please check with MyChart first.  Please remember to sign up for MyChart if you have not done so, as this will be important to you in the future with finding out test results, communicating by private email, and scheduling acute appointments online when needed.  Please return in 3 months, or sooner if needed, with Lab testing done 3-5 days before

## 2017-06-15 ENCOUNTER — Inpatient Hospital Stay (HOSPITAL_COMMUNITY)
Admission: EM | Admit: 2017-06-15 | Discharge: 2017-06-19 | DRG: 683 | Disposition: A | Discharge status: Home / Self Care | Payer: Medicare Other | Attending: Family Medicine | Admitting: Family Medicine

## 2017-06-15 ENCOUNTER — Telehealth: Payer: Self-pay

## 2017-06-15 ENCOUNTER — Encounter (HOSPITAL_COMMUNITY): Payer: Self-pay

## 2017-06-15 ENCOUNTER — Other Ambulatory Visit (INDEPENDENT_AMBULATORY_CARE_PROVIDER_SITE_OTHER): Payer: Medicare Other

## 2017-06-15 ENCOUNTER — Other Ambulatory Visit: Payer: Self-pay

## 2017-06-15 ENCOUNTER — Encounter: Payer: Self-pay | Admitting: Internal Medicine

## 2017-06-15 DIAGNOSIS — I959 Hypotension, unspecified: Secondary | ICD-10-CM | POA: Diagnosis not present

## 2017-06-15 DIAGNOSIS — Z951 Presence of aortocoronary bypass graft: Secondary | ICD-10-CM | POA: Diagnosis not present

## 2017-06-15 DIAGNOSIS — Z888 Allergy status to other drugs, medicaments and biological substances status: Secondary | ICD-10-CM | POA: Diagnosis not present

## 2017-06-15 DIAGNOSIS — I110 Hypertensive heart disease with heart failure: Secondary | ICD-10-CM | POA: Diagnosis not present

## 2017-06-15 DIAGNOSIS — I10 Essential (primary) hypertension: Secondary | ICD-10-CM | POA: Diagnosis not present

## 2017-06-15 DIAGNOSIS — Z801 Family history of malignant neoplasm of trachea, bronchus and lung: Secondary | ICD-10-CM

## 2017-06-15 DIAGNOSIS — M5416 Radiculopathy, lumbar region: Secondary | ICD-10-CM | POA: Diagnosis present

## 2017-06-15 DIAGNOSIS — Z833 Family history of diabetes mellitus: Secondary | ICD-10-CM | POA: Diagnosis not present

## 2017-06-15 DIAGNOSIS — Z7984 Long term (current) use of oral hypoglycemic drugs: Secondary | ICD-10-CM

## 2017-06-15 DIAGNOSIS — I4891 Unspecified atrial fibrillation: Secondary | ICD-10-CM | POA: Diagnosis not present

## 2017-06-15 DIAGNOSIS — E1165 Type 2 diabetes mellitus with hyperglycemia: Secondary | ICD-10-CM

## 2017-06-15 DIAGNOSIS — I5032 Chronic diastolic (congestive) heart failure: Secondary | ICD-10-CM | POA: Diagnosis not present

## 2017-06-15 DIAGNOSIS — Z87891 Personal history of nicotine dependence: Secondary | ICD-10-CM | POA: Diagnosis not present

## 2017-06-15 DIAGNOSIS — E785 Hyperlipidemia, unspecified: Secondary | ICD-10-CM | POA: Diagnosis present

## 2017-06-15 DIAGNOSIS — M479 Spondylosis, unspecified: Secondary | ICD-10-CM | POA: Diagnosis not present

## 2017-06-15 DIAGNOSIS — Z0001 Encounter for general adult medical examination with abnormal findings: Secondary | ICD-10-CM | POA: Diagnosis not present

## 2017-06-15 DIAGNOSIS — I252 Old myocardial infarction: Secondary | ICD-10-CM | POA: Diagnosis not present

## 2017-06-15 DIAGNOSIS — I2510 Atherosclerotic heart disease of native coronary artery without angina pectoris: Secondary | ICD-10-CM | POA: Diagnosis present

## 2017-06-15 DIAGNOSIS — I5033 Acute on chronic diastolic (congestive) heart failure: Secondary | ICD-10-CM | POA: Diagnosis not present

## 2017-06-15 DIAGNOSIS — E1140 Type 2 diabetes mellitus with diabetic neuropathy, unspecified: Secondary | ICD-10-CM | POA: Diagnosis present

## 2017-06-15 DIAGNOSIS — Z7901 Long term (current) use of anticoagulants: Secondary | ICD-10-CM

## 2017-06-15 DIAGNOSIS — M109 Gout, unspecified: Secondary | ICD-10-CM | POA: Diagnosis present

## 2017-06-15 DIAGNOSIS — IMO0002 Reserved for concepts with insufficient information to code with codable children: Secondary | ICD-10-CM | POA: Diagnosis present

## 2017-06-15 DIAGNOSIS — E875 Hyperkalemia: Secondary | ICD-10-CM | POA: Diagnosis not present

## 2017-06-15 DIAGNOSIS — N179 Acute kidney failure, unspecified: Principal | ICD-10-CM | POA: Diagnosis present

## 2017-06-15 DIAGNOSIS — Z66 Do not resuscitate: Secondary | ICD-10-CM | POA: Diagnosis present

## 2017-06-15 DIAGNOSIS — N400 Enlarged prostate without lower urinary tract symptoms: Secondary | ICD-10-CM | POA: Diagnosis present

## 2017-06-15 DIAGNOSIS — Z955 Presence of coronary angioplasty implant and graft: Secondary | ICD-10-CM

## 2017-06-15 DIAGNOSIS — I38 Endocarditis, valve unspecified: Secondary | ICD-10-CM

## 2017-06-15 DIAGNOSIS — G4730 Sleep apnea, unspecified: Secondary | ICD-10-CM | POA: Diagnosis not present

## 2017-06-15 DIAGNOSIS — I77810 Thoracic aortic ectasia: Secondary | ICD-10-CM | POA: Diagnosis not present

## 2017-06-15 DIAGNOSIS — Z7951 Long term (current) use of inhaled steroids: Secondary | ICD-10-CM

## 2017-06-15 DIAGNOSIS — Z8249 Family history of ischemic heart disease and other diseases of the circulatory system: Secondary | ICD-10-CM

## 2017-06-15 DIAGNOSIS — Z950 Presence of cardiac pacemaker: Secondary | ICD-10-CM

## 2017-06-15 DIAGNOSIS — Z91040 Latex allergy status: Secondary | ICD-10-CM | POA: Diagnosis not present

## 2017-06-15 LAB — BASIC METABOLIC PANEL
Anion gap: 10 (ref 5–15)
BUN: 40 mg/dL — AB (ref 6–23)
BUN: 43 mg/dL — AB (ref 6–20)
CALCIUM: 8.2 mg/dL — AB (ref 8.4–10.5)
CHLORIDE: 98 mmol/L — AB (ref 101–111)
CO2: 26 meq/L (ref 19–32)
CO2: 26 mmol/L (ref 22–32)
CREATININE: 4.33 mg/dL — AB (ref 0.61–1.24)
Calcium: 8.3 mg/dL — ABNORMAL LOW (ref 8.9–10.3)
Chloride: 97 mEq/L (ref 96–112)
Creatinine, Ser: 3.24 mg/dL — ABNORMAL HIGH (ref 0.40–1.50)
GFR calc Af Amer: 15 mL/min — ABNORMAL LOW (ref >60)
GFR calc non Af Amer: 13 mL/min — ABNORMAL LOW (ref >60)
GFR: 20.14 mL/min — ABNORMAL LOW (ref >60.00)
GLUCOSE: 116 mg/dL — AB (ref 70–99)
GLUCOSE: 118 mg/dL — AB (ref 65–99)
Potassium: 5 mEq/L (ref 3.5–5.1)
Potassium: 5.1 mmol/L (ref 3.5–5.1)
Sodium: 133 mEq/L — ABNORMAL LOW (ref 135–145)
Sodium: 134 mmol/L — ABNORMAL LOW (ref 135–145)

## 2017-06-15 LAB — HEPATIC FUNCTION PANEL
ALT: 51 U/L (ref 0–53)
AST: 50 U/L — AB (ref 0–37)
Albumin: 3.6 g/dL (ref 3.5–5.2)
Alkaline Phosphatase: 51 U/L (ref 39–117)
BILIRUBIN TOTAL: 0.4 mg/dL (ref 0.2–1.2)
Bilirubin, Direct: 0.1 mg/dL (ref 0.0–0.3)
Total Protein: 6.2 g/dL (ref 6.0–8.3)

## 2017-06-15 LAB — CBC WITH DIFFERENTIAL/PLATELET
BASOS PCT: 0.2 % (ref 0.0–3.0)
Basophils Absolute: 0 10*3/uL (ref 0.0–0.1)
Eosinophils Absolute: 0 10*3/uL (ref 0.0–0.7)
Eosinophils Relative: 0.1 % (ref 0.0–5.0)
HEMATOCRIT: 31.1 % — AB (ref 39.0–52.0)
Hemoglobin: 10.1 g/dL — ABNORMAL LOW (ref 13.0–17.0)
LYMPHS PCT: 19.9 % (ref 12.0–46.0)
Lymphs Abs: 1.3 10*3/uL (ref 0.7–4.0)
MCHC: 32.4 g/dL (ref 30.0–36.0)
MCV: 79.1 fl (ref 78.0–100.0)
MONOS PCT: 11.3 % (ref 3.0–12.0)
Monocytes Absolute: 0.7 10*3/uL (ref 0.1–1.0)
NEUTROS ABS: 4.3 10*3/uL (ref 1.4–7.7)
NEUTROS PCT: 68.5 % (ref 43.0–77.0)
Platelets: 145 10*3/uL — ABNORMAL LOW (ref 150.0–400.0)
RBC: 3.93 Mil/uL — ABNORMAL LOW (ref 4.22–5.81)
RDW: 17.1 % — AB (ref 11.5–15.5)
WBC: 6.3 10*3/uL (ref 4.0–10.5)

## 2017-06-15 LAB — LIPID PANEL
Cholesterol: 94 mg/dL (ref 0–200)
HDL: 29.1 mg/dL — AB (ref >39.00)
LDL CALC: 49 mg/dL (ref 0–99)
NONHDL: 65.23
Total CHOL/HDL Ratio: 3
Triglycerides: 80 mg/dL (ref 0.0–149.0)
VLDL: 16 mg/dL (ref 0.0–40.0)

## 2017-06-15 LAB — URINALYSIS, ROUTINE W REFLEX MICROSCOPIC
Bilirubin Urine: NEGATIVE
Bilirubin Urine: NEGATIVE
GLUCOSE, UA: NEGATIVE mg/dL
Hgb urine dipstick: NEGATIVE
KETONES UR: NEGATIVE
Ketones, ur: NEGATIVE mg/dL
LEUKOCYTES UA: NEGATIVE
LEUKOCYTES UA: NEGATIVE
Nitrite: NEGATIVE
Nitrite: NEGATIVE
PROTEIN: NEGATIVE mg/dL
Specific Gravity, Urine: 1.025 (ref 1.000–1.030)
Specific Gravity, Urine: 1.014 (ref 1.005–1.030)
URINE GLUCOSE: NEGATIVE
Urobilinogen, UA: 0.2 (ref 0.0–1.0)
pH: 5 (ref 5.0–8.0)
pH: 5 (ref 5.0–8.0)

## 2017-06-15 LAB — CBC
HCT: 31.2 % — ABNORMAL LOW (ref 39.0–52.0)
Hemoglobin: 9.5 g/dL — ABNORMAL LOW (ref 13.0–17.0)
MCH: 25.2 pg — ABNORMAL LOW (ref 26.0–34.0)
MCHC: 30.4 g/dL (ref 30.0–36.0)
MCV: 82.8 fL (ref 78.0–100.0)
PLATELETS: 136 10*3/uL — AB (ref 150–400)
RBC: 3.77 MIL/uL — AB (ref 4.22–5.81)
RDW: 16.4 % — ABNORMAL HIGH (ref 11.5–15.5)
WBC: 6.4 10*3/uL (ref 4.0–10.5)

## 2017-06-15 LAB — MICROALBUMIN / CREATININE URINE RATIO
CREATININE, U: 132.6 mg/dL
MICROALB UR: 2.8 mg/dL — AB (ref 0.0–1.9)
Microalb Creat Ratio: 2.1 mg/g (ref 0.0–30.0)

## 2017-06-15 LAB — PSA: PSA: 4.81 ng/mL — ABNORMAL HIGH (ref 0.10–4.00)

## 2017-06-15 LAB — PROTIME-INR
INR: 3.8 ratio — ABNORMAL HIGH (ref 0.8–1.0)
PROTHROMBIN TIME: 43.4 s — AB (ref 9.6–13.1)

## 2017-06-15 LAB — HEMOGLOBIN A1C: HEMOGLOBIN A1C: 8.3 % — AB (ref 4.6–6.5)

## 2017-06-15 LAB — TSH: TSH: 1.11 u[IU]/mL (ref 0.35–4.50)

## 2017-06-15 NOTE — Telephone Encounter (Signed)
-----   Message from Biagio Borg, MD sent at 06/15/2017  1:12 PM EDT ----- See below

## 2017-06-15 NOTE — ED Triage Notes (Signed)
Pt reports he was sent by his PCP for his kidney function. Pt reports he had blood work done for a regular check up.

## 2017-06-15 NOTE — Telephone Encounter (Signed)
Pt has been informed and expressed understanding.  

## 2017-06-16 ENCOUNTER — Observation Stay (HOSPITAL_COMMUNITY): Payer: Medicare Other

## 2017-06-16 DIAGNOSIS — Z801 Family history of malignant neoplasm of trachea, bronchus and lung: Secondary | ICD-10-CM | POA: Diagnosis not present

## 2017-06-16 DIAGNOSIS — Z7951 Long term (current) use of inhaled steroids: Secondary | ICD-10-CM | POA: Diagnosis not present

## 2017-06-16 DIAGNOSIS — Z87891 Personal history of nicotine dependence: Secondary | ICD-10-CM | POA: Diagnosis not present

## 2017-06-16 DIAGNOSIS — G4730 Sleep apnea, unspecified: Secondary | ICD-10-CM | POA: Diagnosis present

## 2017-06-16 DIAGNOSIS — Z7984 Long term (current) use of oral hypoglycemic drugs: Secondary | ICD-10-CM | POA: Diagnosis not present

## 2017-06-16 DIAGNOSIS — E1165 Type 2 diabetes mellitus with hyperglycemia: Secondary | ICD-10-CM | POA: Diagnosis not present

## 2017-06-16 DIAGNOSIS — Z888 Allergy status to other drugs, medicaments and biological substances status: Secondary | ICD-10-CM | POA: Diagnosis not present

## 2017-06-16 DIAGNOSIS — N179 Acute kidney failure, unspecified: Secondary | ICD-10-CM | POA: Diagnosis present

## 2017-06-16 DIAGNOSIS — M479 Spondylosis, unspecified: Secondary | ICD-10-CM | POA: Diagnosis present

## 2017-06-16 DIAGNOSIS — I252 Old myocardial infarction: Secondary | ICD-10-CM | POA: Diagnosis not present

## 2017-06-16 DIAGNOSIS — Z91040 Latex allergy status: Secondary | ICD-10-CM | POA: Diagnosis not present

## 2017-06-16 DIAGNOSIS — E1140 Type 2 diabetes mellitus with diabetic neuropathy, unspecified: Secondary | ICD-10-CM | POA: Diagnosis not present

## 2017-06-16 DIAGNOSIS — I4891 Unspecified atrial fibrillation: Secondary | ICD-10-CM | POA: Diagnosis not present

## 2017-06-16 DIAGNOSIS — E875 Hyperkalemia: Secondary | ICD-10-CM | POA: Diagnosis not present

## 2017-06-16 DIAGNOSIS — N400 Enlarged prostate without lower urinary tract symptoms: Secondary | ICD-10-CM | POA: Diagnosis present

## 2017-06-16 DIAGNOSIS — I5032 Chronic diastolic (congestive) heart failure: Secondary | ICD-10-CM | POA: Diagnosis not present

## 2017-06-16 DIAGNOSIS — I2510 Atherosclerotic heart disease of native coronary artery without angina pectoris: Secondary | ICD-10-CM | POA: Diagnosis present

## 2017-06-16 DIAGNOSIS — Z833 Family history of diabetes mellitus: Secondary | ICD-10-CM | POA: Diagnosis not present

## 2017-06-16 DIAGNOSIS — I5033 Acute on chronic diastolic (congestive) heart failure: Secondary | ICD-10-CM | POA: Diagnosis not present

## 2017-06-16 DIAGNOSIS — Z7901 Long term (current) use of anticoagulants: Secondary | ICD-10-CM | POA: Diagnosis not present

## 2017-06-16 DIAGNOSIS — N182 Chronic kidney disease, stage 2 (mild): Secondary | ICD-10-CM

## 2017-06-16 DIAGNOSIS — I110 Hypertensive heart disease with heart failure: Secondary | ICD-10-CM | POA: Diagnosis present

## 2017-06-16 DIAGNOSIS — I77810 Thoracic aortic ectasia: Secondary | ICD-10-CM | POA: Diagnosis present

## 2017-06-16 DIAGNOSIS — Z8249 Family history of ischemic heart disease and other diseases of the circulatory system: Secondary | ICD-10-CM | POA: Diagnosis not present

## 2017-06-16 DIAGNOSIS — M5416 Radiculopathy, lumbar region: Secondary | ICD-10-CM | POA: Diagnosis present

## 2017-06-16 DIAGNOSIS — M109 Gout, unspecified: Secondary | ICD-10-CM | POA: Diagnosis present

## 2017-06-16 DIAGNOSIS — Z951 Presence of aortocoronary bypass graft: Secondary | ICD-10-CM | POA: Diagnosis not present

## 2017-06-16 DIAGNOSIS — I959 Hypotension, unspecified: Secondary | ICD-10-CM | POA: Diagnosis present

## 2017-06-16 DIAGNOSIS — E785 Hyperlipidemia, unspecified: Secondary | ICD-10-CM | POA: Diagnosis present

## 2017-06-16 LAB — HEPATIC FUNCTION PANEL
ALBUMIN: 3.5 g/dL (ref 3.5–5.0)
ALT: 56 U/L (ref 17–63)
AST: 57 U/L — AB (ref 15–41)
Alkaline Phosphatase: 51 U/L (ref 38–126)
TOTAL PROTEIN: 6.2 g/dL — AB (ref 6.5–8.1)
Total Bilirubin: 0.6 mg/dL (ref 0.3–1.2)

## 2017-06-16 LAB — HEMOGLOBIN A1C
Hgb A1c MFr Bld: 8 % — ABNORMAL HIGH (ref 4.8–5.6)
Mean Plasma Glucose: 182.9 mg/dL

## 2017-06-16 LAB — PROTIME-INR
INR: 2.53
PROTHROMBIN TIME: 27.1 s — AB (ref 11.4–15.2)

## 2017-06-16 LAB — CBG MONITORING, ED
GLUCOSE-CAPILLARY: 80 mg/dL (ref 65–99)
GLUCOSE-CAPILLARY: 92 mg/dL (ref 65–99)

## 2017-06-16 LAB — GLUCOSE, CAPILLARY
Glucose-Capillary: 87 mg/dL (ref 65–99)
Glucose-Capillary: 84 mg/dL (ref 65–99)

## 2017-06-16 MED ORDER — ALLOPURINOL 300 MG PO TABS: 300.0000 mg | ORAL_TABLET | Freq: Every day | ORAL | Status: DC

## 2017-06-16 MED ORDER — SENNOSIDES-DOCUSATE SODIUM 8.6-50 MG PO TABS: 1.0000 | ORAL_TABLET | Freq: Every evening | ORAL | Status: DC | PRN

## 2017-06-16 MED ORDER — ONDANSETRON HCL 4 MG/2ML IJ SOLN: 4.0000 mg | Freq: Four times a day (QID) | INTRAMUSCULAR | Status: DC | PRN

## 2017-06-16 MED ORDER — INSULIN ASPART 100 UNIT/ML ~~LOC~~ SOLN
0.0000 [IU] | Freq: Every day | SUBCUTANEOUS | Status: DC
  Administered 2017-06-17 – 2017-06-18 (×2): 2 [IU] via SUBCUTANEOUS

## 2017-06-16 MED ORDER — WARFARIN - PHARMACIST DOSING INPATIENT
Freq: Every day | Status: DC
  Administered 2017-06-18: 17:00:00

## 2017-06-16 MED ORDER — ACETAMINOPHEN 650 MG RE SUPP: 650.0000 mg | Freq: Four times a day (QID) | RECTAL | Status: DC | PRN

## 2017-06-16 MED ORDER — HYDROCODONE-ACETAMINOPHEN 10-325 MG PO TABS: 1.0000 | ORAL_TABLET | Freq: Every day | ORAL | Status: DC | PRN

## 2017-06-16 MED ORDER — SODIUM CHLORIDE 0.9 % IV SOLN
INTRAVENOUS | Status: DC
  Administered 2017-06-16 – 2017-06-19 (×4): via INTRAVENOUS

## 2017-06-16 MED ORDER — ACETAMINOPHEN 325 MG PO TABS
650.0000 mg | ORAL_TABLET | Freq: Four times a day (QID) | ORAL | Status: DC | PRN
  Administered 2017-06-17 (×2): 650 mg via ORAL
  Filled 2017-06-16 (×3): qty 2

## 2017-06-16 MED ORDER — ALBUTEROL SULFATE (2.5 MG/3ML) 0.083% IN NEBU: 3.0000 mL | INHALATION_SOLUTION | Freq: Four times a day (QID) | RESPIRATORY_TRACT | Status: DC | PRN

## 2017-06-16 MED ORDER — HEPARIN SODIUM (PORCINE) 5000 UNIT/ML IJ SOLN: 5000.0000 [IU] | Freq: Three times a day (TID) | INTRAMUSCULAR | Status: DC

## 2017-06-16 MED ORDER — CARVEDILOL 25 MG PO TABS
25.0000 mg | ORAL_TABLET | Freq: Two times a day (BID) | ORAL | Status: DC
  Administered 2017-06-16 – 2017-06-19 (×6): 25 mg via ORAL
  Filled 2017-06-16 (×6): qty 1

## 2017-06-16 MED ORDER — WARFARIN SODIUM 2 MG PO TABS
4.0000 mg | ORAL_TABLET | Freq: Once | ORAL | Status: AC
  Administered 2017-06-16: 4 mg via ORAL
  Filled 2017-06-16: qty 1
  Filled 2017-06-16: qty 2

## 2017-06-16 MED ORDER — GABAPENTIN 300 MG PO CAPS
300.0000 mg | ORAL_CAPSULE | Freq: Three times a day (TID) | ORAL | Status: DC
  Administered 2017-06-16 – 2017-06-19 (×9): 300 mg via ORAL
  Filled 2017-06-16 (×5): qty 1
  Filled 2017-06-16: qty 3
  Filled 2017-06-16 (×3): qty 1

## 2017-06-16 MED ORDER — DONEPEZIL HCL 5 MG PO TABS
5.0000 mg | ORAL_TABLET | Freq: Every day | ORAL | Status: DC
  Administered 2017-06-16 – 2017-06-18 (×3): 5 mg via ORAL
  Filled 2017-06-16 (×4): qty 1

## 2017-06-16 MED ORDER — ONDANSETRON 4 MG PO TBDP
4.0000 mg | ORAL_TABLET | Freq: Once | ORAL | Status: AC
  Administered 2017-06-16: 4 mg via ORAL
  Filled 2017-06-16: qty 1

## 2017-06-16 MED ORDER — TAMSULOSIN HCL 0.4 MG PO CAPS
0.4000 mg | ORAL_CAPSULE | Freq: Every day | ORAL | Status: DC
  Administered 2017-06-16 – 2017-06-19 (×4): 0.4 mg via ORAL
  Filled 2017-06-16 (×4): qty 1

## 2017-06-16 MED ORDER — TRAZODONE HCL 50 MG PO TABS
25.0000 mg | ORAL_TABLET | Freq: Every evening | ORAL | Status: DC | PRN
  Administered 2017-06-18: 25 mg via ORAL
  Filled 2017-06-16: qty 1

## 2017-06-16 MED ORDER — OXYBUTYNIN CHLORIDE ER 10 MG PO TB24
10.0000 mg | ORAL_TABLET | Freq: Every day | ORAL | Status: DC
  Administered 2017-06-16 – 2017-06-18 (×3): 10 mg via ORAL
  Filled 2017-06-16 (×4): qty 1

## 2017-06-16 MED ORDER — FLUTICASONE PROPIONATE 50 MCG/ACT NA SUSP
2.0000 | Freq: Every day | NASAL | Status: DC
  Administered 2017-06-16 – 2017-06-19 (×4): 2 via NASAL
  Filled 2017-06-16: qty 16

## 2017-06-16 MED ORDER — INSULIN ASPART 100 UNIT/ML ~~LOC~~ SOLN
0.0000 [IU] | Freq: Three times a day (TID) | SUBCUTANEOUS | Status: DC
  Administered 2017-06-17: 2 [IU] via SUBCUTANEOUS
  Administered 2017-06-17: 1 [IU] via SUBCUTANEOUS
  Administered 2017-06-18: 3 [IU] via SUBCUTANEOUS
  Administered 2017-06-18: 2 [IU] via SUBCUTANEOUS
  Administered 2017-06-18: 5 [IU] via SUBCUTANEOUS
  Administered 2017-06-19 (×2): 2 [IU] via SUBCUTANEOUS

## 2017-06-16 MED ORDER — ONDANSETRON HCL 4 MG PO TABS: 4.0000 mg | ORAL_TABLET | Freq: Four times a day (QID) | ORAL | Status: DC | PRN

## 2017-06-16 MED ORDER — ATORVASTATIN CALCIUM 80 MG PO TABS
80.0000 mg | ORAL_TABLET | Freq: Every day | ORAL | Status: DC
  Administered 2017-06-16 – 2017-06-19 (×4): 80 mg via ORAL
  Filled 2017-06-16 (×5): qty 1

## 2017-06-16 NOTE — Assessment & Plan Note (Signed)

## 2017-06-16 NOTE — Assessment & Plan Note (Signed)
stable overall by history and exam, recent data reviewed with pt, and pt to continue medical treatment as before,  to f/u any worsening symptoms or concerns  

## 2017-06-16 NOTE — ED Notes (Signed)
Message sent to pharmacy to verify medications 

## 2017-06-16 NOTE — ED Notes (Signed)
Pt pressed call light, was unable to use urinal, urinated in adult diaper.  Removed diaper and pad and cleaned pt.

## 2017-06-16 NOTE — Progress Notes (Signed)
ANTICOAGULATION CONSULT NOTE - Initial Consult  Pharmacy Consult:  Coumadin Indication: atrial fibrillation  Allergies  Allergen Reactions  . Crestor [Rosuvastatin Calcium] Other (See Comments)    Urination of blood  . Latex Rash and Other (See Comments)    Gloves cause welts on hands!!    Patient Measurements: Height: 5\' 11"  (180.3 cm) Weight: 209 lb (94.8 kg) IBW/kg (Calculated) : 75.3  Vital Signs: Temp: 98.8 F (37.1 C) (05/18 0019) Temp Source: Oral (05/18 0019) BP: 120/52 (05/18 0749) Pulse Rate: 60 (05/18 0749)  Labs: Recent Labs    06/15/17 0808 06/15/17 1648 06/16/17 0552  HGB 10.1* 9.5*  --   HCT 31.1* 31.2*  --   PLT 145.0* 136*  --   LABPROT 43.4*  --  27.1*  INR 3.8*  --  2.53  CREATININE 3.24* 4.33*  --     Estimated Creatinine Clearance: 18.4 mL/min (A) (by C-G formula based on SCr of 4.33 mg/dL (H)).   Medical History: Past Medical History:  Diagnosis Date  . Aortic root dilatation ([place]) 02/02/2011  . Arthritis    "lower back; going back down both my sciatic nerves"  . 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 BENIGN PROSTATIC HYPERTROPHY 08/29/2006   takes Flomax daily  . 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 Cataracts, bilateral    immature  . CHF (congestive heart failure) ([facility])    takes Lasix daily  . Chronic back pain    HNP   . CORONARY ARTERY DISEASE 08/29/2006   takes Coumadin daily  . DEPRESSION 08/29/2006  . DIABETES MELLITUS, TYPE II 08/29/2006   takes Metformin,Januvia,and Glipizide  daily  . DIASTOLIC HEART FAILURE, CHRONIC 06/09/2008  . GOUT 04/22/2007   takes Allopurinol daily  . History of migraine    33yrs ago  . HYPERLIPIDEMIA 08/29/2006   takes Atorvastatin daily  . HYPERTENSION 08/29/2006   takes Lisinopril daily  . INSOMNIA-SLEEP DISORDER-UNSPEC 10/23/2007  . Left lumbar radiculopathy 05/30/2010  . LUMBAR RADICULOPATHY, RIGHT 06/10/2007  . Muscle spasm    takes Zanaflex daily  . Myocardial infarction  ([place]) 12/27/10   "I've had several MIs"  . 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 PACEMAKER, PERMANENT 03/04/2008   pt denies this date  . Peripheral neuropathy    takes Gabapentin daily  . Presence of permanent cardiac pacemaker   . Shortness of breath dyspnea    "all my life" with exertion  . Sleep apnea    "if I lay flat I quit breathing; HOB up I'm fine"      Assessment: 26 YOM sent to the ED from PCP office due to worsening renal function.  Patient has a history of Afib and Pharmacy consulted to continue Coumadin from PTA.  He missed his Coumadin dose yesterday and INR trended down to therapeutic level this AM.  No bleeding reported.  Home Coumadin regimen:  2.5mg  daily except 5mg  on MWF   Goal of Therapy:  INR 2-3 Monitor platelets by anticoagulation protocol: Yes    Plan:  D/C heparin SQ Coumadin 4mg  PO today Daily PT / INR   Florance Paolillo D. Mina Marble, PharmD, BCPS, BCCCP Pager:  [phone] 06/16/2017, 8:31 AM

## 2017-06-16 NOTE — ED Provider Notes (Signed)
TIME SEEN: 4:54 AM  CHIEF COMPLAINT: Abnormal lab  HPI: Patient is a 72 year old male with history of CAD, CHF, hypertension, hyperlipidemia who presents to the emergency department with complaints of abnormal labs.  Was seen he reports for routine checkup on May 16 and had labs and a chest x-ray the next day.  Was found to have acute renal failure.  States he does take ibuprofen 800 mg once a day every day for the past 12 years.  No other NSAID use.  Did have one episode of vomiting yesterday after drinking too quickly.  No diarrhea.  Denies chest pain or shortness of breath.  States he is otherwise been feeling well.  No previous history of kidney failure.  ROS: See HPI Constitutional: no fever  Eyes: no drainage  ENT: no runny nose   Cardiovascular:  no chest pain  Resp: no SOB  GI: no vomiting GU: no dysuria Integumentary: no rash  Allergy: no hives  Musculoskeletal: no leg swelling  Neurological: no slurred speech ROS otherwise negative  PAST MEDICAL HISTORY/PAST SURGICAL HISTORY:  Past Medical History:  Diagnosis Date  . Aortic root dilatation ([place]) 02/02/2011  . Arthritis    "lower back; going back down both my sciatic nerves"  . 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 BENIGN PROSTATIC HYPERTROPHY 08/29/2006   takes Flomax daily  . 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 Cataracts, bilateral    immature  . CHF (congestive heart failure) ([facility])    takes Lasix daily  . Chronic back pain    HNP   . CORONARY ARTERY DISEASE 08/29/2006   takes Coumadin daily  . DEPRESSION 08/29/2006  . DIABETES MELLITUS, TYPE II 08/29/2006   takes Metformin,Januvia,and Glipizide  daily  . DIASTOLIC HEART FAILURE, CHRONIC 06/09/2008  . GOUT 04/22/2007   takes Allopurinol daily  . History of migraine    60yr ago  . HYPERLIPIDEMIA 08/29/2006   takes Atorvastatin daily  . HYPERTENSION 08/29/2006   takes Lisinopril daily  . INSOMNIA-SLEEP DISORDER-UNSPEC 10/23/2007  . Left lumbar  radiculopathy 05/30/2010  . LUMBAR RADICULOPATHY, RIGHT 06/10/2007  . Muscle spasm    takes Zanaflex daily  . [place] 12/27/10   "I've had several MIs"  . 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 PACEMAKER, PERMANENT 03/04/2008   pt denies this date  . Peripheral neuropathy    takes Gabapentin daily  . Presence of permanent cardiac pacemaker   . Shortness of breath dyspnea    "all my life" with exertion  . Sleep apnea    "if I lay flat I quit breathing; HOB up I'm fine"    MEDICATIONS:  Prior to Admission medications   Medication Sig Start Date End Date Taking? Authorizing Provider  albuterol (PROVENTIL HFA;VENTOLIN HFA) 108 (90 Base) MCG/ACT inhaler Inhale 2 puffs into the lungs every 6 (six) hours as needed for wheezing or shortness of breath. 05/21/17   JBiagio Borg MD  allopurinol (ZYLOPRIM) 300 MG tablet take 1 tablet by mouth once daily 05/04/17   JBiagio Borg MD  atorvastatin (LIPITOR) 80 MG tablet Take 1 tablet (80 mg total) by mouth daily. 05/04/17   JBiagio Borg MD  Blood Glucose Monitoring Suppl (ONE TOUCH ULTRA 2) w/Device KIT Use as directed 10/12/15   JBiagio Borg MD  carvedilol (COREG) 25 MG tablet Take 1 tablet (25 mg total) by mouth 2 (two) times daily. 05/04/17   JBiagio Borg MD  Cholecalciferol (VITAMIN D) 1000 UNITS capsule Take  1,000 Units by mouth 2 (two) times daily. Reported on 02/15/2015 07/31/13   [provider]  donepezil (ARICEPT) 5 MG tablet Take 1 tablet (5 mg total) by mouth at bedtime. 05/04/17   Biagio Borg, MD  fluocinonide cream (LIDEX) 9.50 % Apply 1 application topically 2 (two) times daily. 01/19/16   Biagio Borg, MD  fluticasone [facility]) 50 MCG/ACT nasal spray USE 2 SPRAYS IN [facility]  NOSTRIL EVERY DAY 11/23/16   Biagio Borg, MD  fluticasone [facility]) 50 MCG/ACT nasal spray USE 2 SPRAYS IN [facility]  NOSTRIL EVERY DAY 05/28/17   Biagio Borg, MD  furosemide (LASIX) 40 MG tablet Take 3 tablets (120 mg total) by mouth 2  (two) times daily. 05/04/17   Biagio Borg, MD  gabapentin (NEURONTIN) 300 MG capsule TAKE 2 CAPSULES BY MOUTH 3  TIMES DAILY Patient taking differently: TAKE 1 CAPSULE BY MOUTH 3  TIMES DAILY 02/09/17   Biagio Borg, MD  glipiZIDE (GLUCOTROL XL) 10 MG 24 hr tablet Take 1 tablet (10 mg total) by mouth daily with breakfast. 05/04/17   Biagio Borg, MD  glucose blood (ONE TOUCH ULTRA TEST) test strip CHECK BLOOD SUGAR TWO TIMES DAILY 05/24/17   Biagio Borg, MD  HYDROcodone-acetaminophen (NORCO) 10-325 MG tablet Take 1 tablet by mouth daily as needed. 05/25/17   Biagio Borg, MD  lisinopril (PRINIVIL,ZESTRIL) 2.5 MG tablet Take 1 tablet (2.5 mg total) by mouth daily. 05/04/17   Biagio Borg, MD  metFORMIN (GLUCOPHAGE) 500 MG tablet TAKE 2 TABLETS BY MOUTH TWO TIMES DAILY 05/04/17   Biagio Borg, MD  methocarbamol (ROBAXIN) 500 MG tablet Take 1 tablet (500 mg total) by mouth every 8 (eight) hours as needed for muscle spasms. 05/04/17   Biagio Borg, MD  Multiple Vitamin (MULTIVITAMIN WITH MINERALS) TABS tablet Take 1 tablet by mouth every morning.    [provider]  [facility] DELICA LANCETS 93O MISC USE AS DIRECTED THREE TIMES DAILY TO CHECK BLOOD SUGAR 05/24/17   Biagio Borg, MD  oxybutynin (DITROPAN XL) 10 MG 24 hr tablet Take 1 tablet (10 mg total) by mouth at bedtime. 10/17/16   Biagio Borg, MD  potassium chloride SA (K-DUR,KLOR-CON) 20 MEQ tablet Take 1 tablet (20 mEq total) by mouth every evening. 05/04/17   Biagio Borg, MD  sitaGLIPtin (JANUVIA) 100 MG tablet Take 1 tablet (100 mg total) by mouth daily. 05/25/17   Biagio Borg, MD  tamsulosin (FLOMAX) 0.4 MG CAPS capsule TAKE 1 CAPSULE BY MOUTH  EVERY 12 HOURS 05/04/17   Biagio Borg, MD  tiZANidine (ZANAFLEX) 4 MG tablet TAKE 1 TABLET BY MOUTH  EVERY 6 HOURS AS NEEDED FOR MUSCLE SPASMS. 05/04/17   Biagio Borg, MD  traMADol (ULTRAM) 50 MG tablet Take 1 tablet (50 mg total) by mouth every 8 (eight) hours as needed. 09/29/16   Biagio Borg, MD   warfarin (COUMADIN) 5 MG tablet Take 1/2 tablet daily except 1 tablet on Mondays, Wednesdays and Fridays 04/26/17   [name], MD    ALLERGIES:  Allergies  Allergen Reactions  . Crestor [Rosuvastatin Calcium] Other (See Comments)    Urination of blood  . Latex Rash and Other (See Comments)    Gloves cause welts on hands!!    SOCIAL HISTORY:  Social History   Tobacco Use  . Smoking status: Former Smoker    Packs/day: 3.00    Years: 9.00  Pack years: 27.00    Types: Cigarettes    Last attempt to quit: 02/26/1973    Years since quitting: 44.3  . Smokeless tobacco: Former Systems developer  . Tobacco comment: quit smoking 47yr ago  Substance Use Topics  . Alcohol use: No    Alcohol/week: 0.0 oz    FAMILY HISTORY: Family History  Problem Relation Age of Onset  . Diabetes Mother   . Diabetes Sister   . Heart disease Sister        2 sister died with heart disease  . Coronary artery disease Other 552      male, first degree relative  . Diabetes Other        1st degree relative  . Heart disease Sister   . Lung cancer Sister        deceased    EXAM: BP (!) 97/52   Pulse 64   Temp 98.8 F (37.1 C) (Oral)   Resp (!) 22   Ht '5\' 11"'  (1.803 m)   Wt 94.8 kg (209 lb)   SpO2 98%   BMI 29.15 kg/m  CONSTITUTIONAL: Alert and oriented and responds appropriately to questions.  Elderly, no distress HEAD: Normocephalic EYES: Conjunctivae clear, pupils appear equal, EOMI ENT: normal nose; moist mucous membranes NECK: Supple, no meningismus, no nuchal rigidity, no LAD  CARD: RRR; S1 and S2 appreciated; no murmurs, no clicks, no rubs, no gallops RESP: Normal chest excursion without splinting or tachypnea; breath sounds clear and equal bilaterally; no wheezes, no rhonchi, no rales, no hypoxia or respiratory distress, speaking full sentences ABD/GI: Normal bowel sounds; non-distended; soft, non-tender, no rebound, no guarding, no peritoneal signs, no hepatosplenomegaly BACK:  The  back appears normal and is non-tender to palpation, there is no CVA tenderness EXT: Normal ROM in all joints; non-tender to palpation; no edema; normal capillary refill; no cyanosis, no calf tenderness or swelling    SKIN: Normal color for age and race; warm; no rash NEURO: Moves all extremities equally PSYCH: The patient's mood and manner are appropriate. Grooming and personal hygiene are appropriate.  MEDICAL DECISION MAKING: Patient here with acute renal failure.  Unclear etiology at this time but could be related to hypovolemia, dehydration, overdiuresis.  He is slightly hypotensive and appears as if he could be dry on exam.  Will gently hydrate patient.  Does have a history of CHF.  Otherwise labs unremarkable.  Urine shows no sign of infection.  He denies abdominal pain or flank pain.  Doubt that this is postobstructive in nature.  Will discuss with medicine for admission.  ED PROGRESS: 5:26 AM Discussed patient's case with hospitalist, Dr. NBlaine Hamper  I have recommended admission and patient (and family if present) agree with this plan. Admitting physician will place admission orders.   I reviewed all nursing notes, vitals, pertinent previous records, EKGs, lab and urine results, imaging (as available).       Ariadna Setter, KDelice Bison DO 06/16/17 [phone]

## 2017-06-16 NOTE — Assessment & Plan Note (Addendum)
Etiology unclear, for cxr with labs, pt reqeusts cardiology referral  In addition to the time spent performing CPE, I spent an additional 25 minutes face to face,in which greater than 50% of this time was spent in counseling and coordination of care for patient's acute illness as documented, including the differential dx, treatment, further evaluation and other management of dyspnea, CKD, HTN, DM and anticoag long term use

## 2017-06-16 NOTE — Assessment & Plan Note (Signed)
stable overall by history and exam, recent data reviewed with pt, and pt to continue medical treatment as before,  to f/u any worsening symptoms or concern, for a1c with labs

## 2017-06-16 NOTE — ED Notes (Signed)
Paged MD, NP returned page.  Advised on urine status, oxygen saturation in 80's on room air, and bladder scan amount.

## 2017-06-16 NOTE — ED Notes (Signed)
Pt given urinal and attempted to urinate.  Unable to urinate at this time.

## 2017-06-16 NOTE — Assessment & Plan Note (Signed)
Also for f/u with labs, pt states he will come back in the AM

## 2017-06-16 NOTE — ED Notes (Signed)
Pt urinated on self, underwear and pad underneath patient saturated.

## 2017-06-16 NOTE — ED Notes (Signed)
Merlene Laughter NP at bedside.

## 2017-06-16 NOTE — ED Notes (Signed)
Patient has been cleaned, changed linen.

## 2017-06-16 NOTE — Assessment & Plan Note (Signed)
For INR with labs

## 2017-06-16 NOTE — ED Notes (Signed)
Patient transported to Ultrasound 

## 2017-06-16 NOTE — Care Management (Signed)
This is a no charge note  Pending admission per Dr. Leonides Schanz  72 year old male with past medical history of hyperlipidemia, diabetes mellitus, gout, CAD, dCHF, pacemaker placement, OSA, CKD-2, who presents with worsening renal function.  Patient was sent here from PCP due to abnormal lab work.  Creatinine is up from 1.0-->4.33, BUN 43.  Urinalysis negative.  Unclear etiology.  Patient is on high-dose Lasix 120 mg twice daily, likely over diuresed.  I ordered renal ultrasound. NS 125 cc/h on going. Patient is placed on telemetry bed for observation.  Ivor Costa, MD  [facility] Pager [phone]  If 7PM-7AM, please contact night-coverage [email or web address] Password [facility] 06/16/2017, 5:27 AM

## 2017-06-16 NOTE — ED Notes (Signed)
Pt refused to eat any lunch.

## 2017-06-16 NOTE — H&P (Signed)
History and Physical    BRAND SIEVER GNO:[id number] DOB: 08/15/1945 DOA: 06/15/2017  PCP: Biagio Borg, MD  Patient coming from: Home   Chief Complaint: Abnormal labs   HPI: [name] is a 72 y.o. male with medical history significant of chronic diastolic heart failure, CAD, HTN, HLD, type II DM, who presented to the emergency department with complaints of abnormal labs. [name] states he saw his PCP May 16th and was called and informed to go to the emergency department because  "my kidenys we trying to fail". Labs revealed acute renal failure. He reports compliance with home medications, including oral lasix for treatment of his congestive heart failure. He reports taking ibuprofen once daily with no other NSAID use at home. Denies any shortness of breath or chest pain. No change in urination pattern at home, report clear yellow urine with normal frequency, dysuria, flank pain, hematuria, or urgency.. No fevers, chill, or fatigue. No cough or shortness of breath. No chest pain or palpations.No abdominal pain, nausea or vomiting. Code status conversation held upon admission and patient states he is a Do Not Resuscitate   ED Course: In the emergency department he is afebrile, hemodynamically stable with slightly soft blood pressures. He is provided with gentle IV hydration. Renal ultrasound obtained and revealed a normal ultrasound. Urinalysis was also obtained and negative. At time of admission he is resting on stretcher.    Review of Systems:  As per HPI otherwise all other systems reviewed and are negative    Past Medical History:  Diagnosis Date  . Aortic root dilatation ([place]) 02/02/2011  . Arthritis    "lower back; going back down both my sciatic nerves"  . 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 BENIGN PROSTATIC HYPERTROPHY 08/29/2006   takes Flomax daily  . 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 Cataracts, bilateral    immature  . CHF (congestive heart  failure) (HCC)    takes Lasix daily  . Chronic back pain    HNP   . CORONARY ARTERY DISEASE 08/29/2006   takes Coumadin daily  . DEPRESSION 08/29/2006  . DIABETES MELLITUS, TYPE II 08/29/2006   takes Metformin,Januvia,and Glipizide  daily  . DIASTOLIC HEART FAILURE, CHRONIC 06/09/2008  . GOUT 04/22/2007   takes Allopurinol daily  . History of migraine    51yr ago  . HYPERLIPIDEMIA 08/29/2006   takes Atorvastatin daily  . HYPERTENSION 08/29/2006   takes Lisinopril daily  . INSOMNIA-SLEEP DISORDER-UNSPEC 10/23/2007  . Left lumbar radiculopathy 05/30/2010  . LUMBAR RADICULOPATHY, RIGHT 06/10/2007  . Muscle spasm    takes Zanaflex daily  . [place] 12/27[place]10   "I've had several MIs"  . 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 PACEMAKER, PERMANENT 03/04/2008   pt denies this date  . Peripheral neuropathy    takes Gabapentin daily  . Presence of permanent cardiac pacemaker   . Shortness of breath dyspnea    "all my life" with exertion  . Sleep apnea    "if I lay flat I quit breathing; HOB up I'm fine"    Past Surgical History:  Procedure Laterality Date  . COLONOSCOPY    . CORONARY ANGIOPLASTY WITH STENT PLACEMENT  12/27/10   "I've had a total of 9 cardiac stents put in"  . CORONARY ARTERY BYPASS GRAFT  1992   CABG X 2  . ESOPHAGOGASTRODUODENOSCOPY N/A 12/01/2013   Procedure: ESOPHAGOGASTRODUODENOSCOPY (EGD);  Surgeon: DLafayette Dragon MD;  [facility];  Service: Endoscopy;  Laterality: N/A;  . INSERT / REPLACE / REMOVE PACEMAKER  ~ 2004   initial pacemaker placement  . INSERT / REPLACE / REMOVE PACEMAKER  10/2009   generator change  . LUMBAR LAMINECTOMY/DECOMPRESSION MICRODISCECTOMY Right 03/23/2014   Procedure: LAMINECTOMY AND FORAMINOTOMY RIGHT LUMBAR THREE-FOUR,LUMBAR FOUR-FIVE, LUMBAR FIVE-SACRAL ONE;  Surgeon: Charlie Pitter, MD;  Location: [place] [facility];  Service: Neurosurgery;  Laterality: Right;  right  . LUMBAR LAMINECTOMY/DECOMPRESSION  MICRODISCECTOMY Left 12/01/2014   Procedure: Left Lumbar Three-Four, Lumbar Four-Five Laminectomy and Foraminotomy;  Surgeon: Earnie Larsson, MD;  Location: [place] [facility];  Service: Neurosurgery;  Laterality: Left;  . s/p left arm surgury after work accident  1991   "2000# steel fell on it"  . s/p right hand surgury for foreign object  1970's   "piece of wood went in my hand; had to get that out"     reports that he quit smoking about 44 years ago. His smoking use included cigarettes. He has a 27.00 pack-year smoking history. He has quit using smokeless tobacco. He reports that he does not drink alcohol or use drugs.  Allergies  Allergen Reactions  . Crestor [Rosuvastatin Calcium] Other (See Comments)    Urination of blood  . Latex Rash and Other (See Comments)    Gloves cause welts on hands!!    Family History  Problem Relation Age of Onset  . Diabetes Mother   . Diabetes Sister   . Heart disease Sister        2 sister died with heart disease  . Coronary artery disease Other 77       male, first degree relative  . Diabetes Other        1st degree relative  . Heart disease Sister   . Lung cancer Sister        deceased    Prior to Admission medications   Medication Sig Start Date End Date Taking? Authorizing Provider  albuterol (PROVENTIL HFA;VENTOLIN HFA) 108 (90 Base) MCG/ACT inhaler Inhale 2 puffs into the lungs every 6 (six) hours as needed for wheezing or shortness of breath. 05/21/17  Yes Biagio Borg, MD  allopurinol (ZYLOPRIM) 300 MG tablet take 1 tablet by mouth once daily 05/04/17  Yes Biagio Borg, MD  atorvastatin (LIPITOR) 80 MG tablet Take 1 tablet (80 mg total) by mouth daily. 05/04/17  Yes Biagio Borg, MD  carvedilol (COREG) 25 MG tablet Take 1 tablet (25 mg total) by mouth 2 (two) times daily. 05/04/17  Yes Biagio Borg, MD  Cholecalciferol (VITAMIN D) 1000 UNITS capsule Take 1,000 Units by mouth 2 (two) times daily. Reported on 02/15/2015 07/31/13  Yes [provider]  donepezil (ARICEPT) 5 MG tablet Take 1 tablet (5 mg total) by mouth at bedtime. 05/04/17  Yes Biagio Borg, MD  fluticasone [facility]) 50 MCG/ACT nasal spray USE 2 SPRAYS IN [facility]  NOSTRIL EVERY DAY 11/23/16  Yes Biagio Borg, MD  furosemide (LASIX) 40 MG tablet Take 3 tablets (120 mg total) by mouth 2 (two) times daily. 05/04/17  Yes Biagio Borg, MD  gabapentin (NEURONTIN) 300 MG capsule TAKE 2 CAPSULES BY MOUTH 3  TIMES DAILY Patient taking differently: TAKE 1 CAPSULE BY MOUTH 3  TIMES DAILY 02/09/17  Yes Biagio Borg, MD  glipiZIDE (GLUCOTROL XL) 10 MG 24 hr tablet Take 1 tablet (10 mg total) by mouth daily with breakfast. 05/04/17  Yes Biagio Borg, MD  HYDROcodone-acetaminophen [facility]) 10-325 MG tablet Take  1 tablet by mouth daily as needed. Patient taking differently: Take 1 tablet by mouth daily as needed for moderate pain.  05/25/17  Yes Biagio Borg, MD  lisinopril (PRINIVIL,ZESTRIL) 2.5 MG tablet Take 1 tablet (2.5 mg total) by mouth daily. 05/04/17  Yes Biagio Borg, MD  metFORMIN (GLUCOPHAGE) 500 MG tablet TAKE 2 TABLETS BY MOUTH TWO TIMES DAILY 05/04/17  Yes Biagio Borg, MD  Multiple Vitamin (MULTIVITAMIN WITH MINERALS) TABS tablet Take 1 tablet by mouth every morning.   Yes [provider]  oxybutynin (DITROPAN XL) 10 MG 24 hr tablet Take 1 tablet (10 mg total) by mouth at bedtime. 10/17/16  Yes Biagio Borg, MD  potassium chloride SA (K-DUR,KLOR-CON) 20 MEQ tablet Take 1 tablet (20 mEq total) by mouth every evening. 05/04/17  Yes Biagio Borg, MD  sitaGLIPtin (JANUVIA) 100 MG tablet Take 1 tablet (100 mg total) by mouth daily. 05/25/17  Yes Biagio Borg, MD  tamsulosin (FLOMAX) 0.4 MG CAPS capsule TAKE 1 CAPSULE BY MOUTH  EVERY 12 HOURS 05/04/17  Yes Biagio Borg, MD  warfarin (COUMADIN) 5 MG tablet Take 1/2 tablet daily except 1 tablet on Mondays, Wednesdays and Fridays Patient taking differently: Take 2.5-5 mg by mouth See admin instructions. Take 1/2 tablet daily  except 1 tablet on Mondays, Wednesdays and Fridays 04/26/17  Yes Crenshaw, Denice Bors, MD  Blood Glucose Monitoring Suppl (ONE TOUCH ULTRA 2) w/Device KIT Use as directed 10/12/15   Biagio Borg, MD  glucose blood (ONE TOUCH ULTRA TEST) test strip CHECK BLOOD SUGAR TWO TIMES DAILY 05/24/17   Biagio Borg, MD  [facility] DELICA LANCETS 28M MISC USE AS DIRECTED THREE TIMES DAILY TO CHECK BLOOD SUGAR 05/24/17   Biagio Borg, MD    Physical Exam: Vitals:   06/16/17 0019 06/16/17 0204 06/16/17 0453 06/16/17 0749  BP: 104/66 (!) 105/57 (!) 97/52 (!) 120/52  Pulse: 60 60 64 60  Resp: 16 16 (!) 22 20  Temp: 98.8 F (37.1 C)     TempSrc: Oral     SpO2: 96% 93% 98% 93%  Weight:      Height:          Constitutional: NAD, calm, comfortable Vitals:   06/16/17 0019 06/16/17 0204 06/16/17 0453 06/16/17 0749  BP: 104/66 (!) 105/57 (!) 97/52 (!) 120/52  Pulse: 60 60 64 60  Resp: 16 16 (!) 22 20  Temp: 98.8 F (37.1 C)     TempSrc: Oral     SpO2: 96% 93% 98% 93%  Weight:      Height:       Eyes: PERRL, lids and conjunctivae normal ENMT: Mucous membranes are pink and slightly dry. Posterior pharynx clear of any exudate or lesions.Normal dentition.  Neck: normal, supple, no masses, no thyromegaly Respiratory: clear to auscultation bilaterally, no wheezing, no crackles. Normal respiratory effort. No accessory muscle use.  Cardiovascular: irregular with history if atrial fibrillation, no murmurs / rubs / gallops. No extremity edema. No carotid bruits.  Abdomen: no tenderness, no masses palpated. No hepatosplenomegaly. Bowel sounds positive.  Musculoskeletal: no clubbing / cyanosis. No joint deformity upper and lower extremities. Good ROM, no contractures. Normal muscle tone.  Skin: no rashes, lesions, ulcers. No induration Neurologic: CN 2-12 grossly intact. Sensation intact Strength 5/5 in all 4.  Psychiatric: Normal judgment and insight. Alert and oriented x 3. Normal mood.   Labs on Admission: I  have personally reviewed following labs and imaging studies  CBC: Recent Labs  Lab 06/15/17 0808 06/15/17 1648  WBC 6.3 6.4  NEUTROABS 4.3  --   HGB 10.1* 9.5*  HCT 31.1* 31.2*  MCV 79.1 82.8  PLT 145.0* 161*   Basic Metabolic Panel: Recent Labs  Lab 06/15/17 0808 06/15/17 1648  NA 133* 134*  K 5.0 5.1  CL 97 98*  CO2 26 26  GLUCOSE 116* 118*  BUN 40* 43*  CREATININE 3.24* 4.33*  CALCIUM 8.2* 8.3*   GFR: Estimated Creatinine Clearance: 18.4 mL/min (A) (by C-G formula based on SCr of 4.33 mg/dL (H)). Liver Function Tests: Recent Labs  Lab 06/15/17 0808 06/16/17 0552  AST 50* 57*  ALT 51 56  ALKPHOS 51 51  BILITOT 0.4 0.6  PROT 6.2 6.2*  ALBUMIN 3.6 3.5   No results for input(s): LIPASE, AMYLASE in the last 168 hours. No results for input(s): AMMONIA in the last 168 hours. Coagulation Profile: Recent Labs  Lab 06/15/17 0808 06/16/17 0552  INR 3.8* 2.53   Cardiac Enzymes: No results for input(s): CKTOTAL, CKMB, CKMBINDEX, TROPONINI in the last 168 hours. BNP (last 3 results) Recent Labs    02/28/17 0848 03/16/17 0923  PROBNP 283.0* 164.0*   HbA1C: Recent Labs    06/15/17 0808  HGBA1C 8.3*   CBG: Recent Labs  Lab 06/16/17 0753  GLUCAP 80   Lipid Profile: Recent Labs    06/15/17 0808  CHOL 94  HDL 29.10*  LDLCALC 49  TRIG 80.0  CHOLHDL 3   Thyroid Function Tests: Recent Labs    06/15/17 0808  TSH 1.11   Anemia Panel: No results for input(s): VITAMINB12, FOLATE, FERRITIN, TIBC, IRON, RETICCTPCT in the last 72 hours. Urine analysis:    Component Value Date/Time   COLORURINE YELLOW 06/15/2017 2228   APPEARANCEUR CLEAR 06/15/2017 2228   LABSPEC 1.014 06/15/2017 2228   PHURINE 5.0 06/15/2017 2228   GLUCOSEU NEGATIVE 06/15/2017 2228   GLUCOSEU NEGATIVE 06/15/2017 0857   HGBUR NEGATIVE 06/15/2017 2228   BILIRUBINUR NEGATIVE 06/15/2017 2228   KETONESUR NEGATIVE 06/15/2017 2228   PROTEINUR NEGATIVE 06/15/2017 2228   UROBILINOGEN  0.2 06/15/2017 0857   NITRITE NEGATIVE 06/15/2017 2228   LEUKOCYTESUR NEGATIVE 06/15/2017 2228   Sepsis Labs: )No results found for this or any previous visit (from the past 240 hour(s)).   Radiological Exams on Admission: US Renal  Result Date: 06/16/2017 CLINICAL DATA:  Initial evaluation for acute renal injury. EXAM: RENAL / URINARY TRACT ULTRASOUND COMPLETE COMPARISON:  Prior CT from 07/11/2004 FINDINGS: Right Kidney: Length: 11.9 cm. Echogenicity within normal limits. No mass or hydronephrosis visualized. Left Kidney: Length: 11.7 cm. Echogenicity within normal limits. No mass or hydronephrosis visualized. Bladder: Appears normal for degree of bladder distention. IMPRESSION: Normal renal ultrasound.  No hydronephrosis. Electronically Signed   By: Jeannine Boga M.D.   On: 06/16/2017 06:57    EKG: Not preformed   Assessment/Plan Principal Problem:   Acute renal failure (HCC) Active Problems:   Type 2 diabetes, uncontrolled, with neuropathy (HCC)   Hyperlipidemia   DIASTOLIC HEART FAILURE, CHRONIC   Atrial fibrillation ([place])  #1-Acute renal failure:  Likely due to over-diurising in the setting of ACE inhibitor. Creatine is 4.33 on admission with a baseline of 1.2 renal ultrasound and urinalysis negative -gentle IV hydration and monitor closely for volume overload -avoid nephrotoxic agents -hold home lasix and ACE I for now  -If labs do not improve consider Nephrology consult  -Monitor labs daily  -monitor urine output  #2 Chronic diastolic  heart failure: Patient appears well compensated, last ECHO obtained 03/22/2017 with EF 55-60%, mild LVH and indeterminate diastolic function (pt in A-fib), mildly disalted aortic root, PA peak pressure 35 mm Hg. Home meds include; Coreg, lasix, lisinopril -daily weight -heart healthy diet -monitor intake and output  -hold lasix and lisinopril due to #1  -continue coreg  #3 Atrial fibrillation Heart rate 64 heart sounds irregular, home  meds include coreg and coumadin -coumadin per pharmacy  -obtain EKG for toughness   #4 HTN: blood pressure have been on the low end of normal, home meds as noted above -continue Coreg with parameter -hold ACE I  #5 Type II DM: serum glucose 116 home regiment oral agents only  -obtain hemoglobin A1C -SSI for optimal control -Hold oral agent     DVT prophylaxis: SQ heparin   Code Status: Do Not Resuscitate  Family Communication: No family present at bedside Disposition Plan: Home Consults called: None Admission status: Tele   Johnsie Cancel MD [facility]  If 7PM-7AM, please contact night-coverage [email or web address] Password [facility]  06/16/2017, 8:32 AM

## 2017-06-17 DIAGNOSIS — N179 Acute kidney failure, unspecified: Principal | ICD-10-CM

## 2017-06-17 LAB — BASIC METABOLIC PANEL
Anion gap: 9 (ref 5–15)
Anion gap: 10 (ref 5–15)
BUN: 48 mg/dL — AB (ref 6–20)
BUN: 37 mg/dL — ABNORMAL HIGH (ref 6–20)
CHLORIDE: 102 mmol/L (ref 101–111)
CO2: 22 mmol/L (ref 22–32)
CO2: 23 mmol/L (ref 22–32)
CREATININE: 3.38 mg/dL — AB (ref 0.61–1.24)
Calcium: 8.1 mg/dL — ABNORMAL LOW (ref 8.9–10.3)
Calcium: 8.7 mg/dL — ABNORMAL LOW (ref 8.9–10.3)
Chloride: 105 mmol/L (ref 101–111)
Creatinine, Ser: 2.24 mg/dL — ABNORMAL HIGH (ref 0.61–1.24)
GFR calc Af Amer: 32 mL/min — ABNORMAL LOW (ref >60)
GFR calc non Af Amer: 17 mL/min — ABNORMAL LOW (ref >60)
GFR calc non Af Amer: 28 mL/min — ABNORMAL LOW (ref >60)
GFR, EST AFRICAN AMERICAN: 20 mL/min — AB (ref >60)
Glucose, Bld: 113 mg/dL — ABNORMAL HIGH (ref 65–99)
Glucose, Bld: 114 mg/dL — ABNORMAL HIGH (ref 65–99)
Potassium: 5.5 mmol/L — ABNORMAL HIGH (ref 3.5–5.1)
Potassium: 5.2 mmol/L — ABNORMAL HIGH (ref 3.5–5.1)
SODIUM: 133 mmol/L — AB (ref 135–145)
Sodium: 138 mmol/L (ref 135–145)

## 2017-06-17 LAB — CBC
HCT: 30.9 % — ABNORMAL LOW (ref 39.0–52.0)
Hemoglobin: 9.3 g/dL — ABNORMAL LOW (ref 13.0–17.0)
MCH: 24.7 pg — AB (ref 26.0–34.0)
MCHC: 30.1 g/dL (ref 30.0–36.0)
MCV: 82 fL (ref 78.0–100.0)
PLATELETS: 124 10*3/uL — AB (ref 150–400)
RBC: 3.77 MIL/uL — AB (ref 4.22–5.81)
RDW: 16.2 % — AB (ref 11.5–15.5)
WBC: 5.2 10*3/uL (ref 4.0–10.5)

## 2017-06-17 LAB — GLUCOSE, CAPILLARY
GLUCOSE-CAPILLARY: 174 mg/dL — AB (ref 65–99)
GLUCOSE-CAPILLARY: 219 mg/dL — AB (ref 65–99)
Glucose-Capillary: 122 mg/dL — ABNORMAL HIGH (ref 65–99)
Glucose-Capillary: 109 mg/dL — ABNORMAL HIGH (ref 65–99)

## 2017-06-17 LAB — PROTIME-INR
INR: 2.38
PROTHROMBIN TIME: 25.8 s — AB (ref 11.4–15.2)

## 2017-06-17 MED ORDER — SODIUM POLYSTYRENE SULFONATE 15 GM/60ML PO SUSP
30.0000 g | Freq: Three times a day (TID) | ORAL | Status: AC
  Administered 2017-06-17 (×2): 30 g via ORAL
  Filled 2017-06-17 (×2): qty 120

## 2017-06-17 MED ORDER — SODIUM CHLORIDE 0.9 % IV BOLUS
500.0000 mL | Freq: Once | INTRAVENOUS | Status: AC
  Administered 2017-06-17: 500 mL via INTRAVENOUS

## 2017-06-17 MED ORDER — WARFARIN SODIUM 2 MG PO TABS
4.0000 mg | ORAL_TABLET | Freq: Once | ORAL | Status: AC
  Administered 2017-06-17: 4 mg via ORAL
  Filled 2017-06-17: qty 2

## 2017-06-17 NOTE — Progress Notes (Signed)
Patient had temp of 102.2 last night . Orders received for 500cc ns bolus and blood cultures. Tylenol given. Temp 99.1.

## 2017-06-17 NOTE — Progress Notes (Signed)
ANTICOAGULATION CONSULT NOTE - Follow Up Consult  Pharmacy Consult:  Coumadin Indication: atrial fibrillation  Allergies  Allergen Reactions  . Crestor [Rosuvastatin Calcium] Other (See Comments)    Urination of blood  . Latex Rash and Other (See Comments)    Gloves cause welts on hands!!   Patient Measurements: Height: 5\' 11"  (180.3 cm) Weight: 215 lb 6.2 oz (97.7 kg) IBW/kg (Calculated) : 75.3  Vital Signs: Temp: 101.4 F (38.6 C) (05/19 0850) Temp Source: Oral (05/19 0850) BP: 128/60 (05/19 0837) Pulse Rate: 62 (05/19 0837)  Labs: Recent Labs    06/15/17 0808 06/15/17 1648 06/16/17 0552 06/17/17 0050  HGB 10.1* 9.5*  --  9.3*  HCT 31.1* 31.2*  --  30.9*  PLT 145.0* 136*  --  124*  LABPROT 43.4*  --  27.1* 25.8*  INR 3.8*  --  2.53 2.38  CREATININE 3.24* 4.33*  --  3.38*   Estimated Creatinine Clearance: 23.9 mL/min (A) (by C-G formula based on SCr of 3.38 mg/dL (H)).  Medical History: Past Medical History:  Diagnosis Date  . Aortic root dilatation ([place]) 02/02/2011  . Arthritis    "lower back; going back down both my sciatic nerves"  . 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 BENIGN PROSTATIC HYPERTROPHY 08/29/2006   takes Flomax daily  . 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 Cataracts, bilateral    immature  . CHF (congestive heart failure) (HCC)    takes Lasix daily  . Chronic back pain    HNP   . CORONARY ARTERY DISEASE 08/29/2006   takes Coumadin daily  . DEPRESSION 08/29/2006  . DIABETES MELLITUS, TYPE II 08/29/2006   takes Metformin,Januvia,and Glipizide  daily  . DIASTOLIC HEART FAILURE, CHRONIC 06/09/2008  . GOUT 04/22/2007   takes Allopurinol daily  . History of migraine    35yrs ago  . HYPERLIPIDEMIA 08/29/2006   takes Atorvastatin daily  . HYPERTENSION 08/29/2006   takes Lisinopril daily  . INSOMNIA-SLEEP DISORDER-UNSPEC 10/23/2007  . Left lumbar radiculopathy 05/30/2010  . LUMBAR RADICULOPATHY, RIGHT 06/10/2007  . Muscle spasm    takes Zanaflex daily  . Myocardial infarction ([place]) 12/27/10   "I've had several MIs"  . 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 PACEMAKER, PERMANENT 03/04/2008   pt denies this date  . Peripheral neuropathy    takes Gabapentin daily  . Presence of permanent cardiac pacemaker   . Shortness of breath dyspnea    "all my life" with exertion  . Sleep apnea    "if I lay flat I quit breathing; HOB up I'm fine"   Assessment: [name] sent to the ED from PCP office due to worsening renal function.  Patient has a history of Afib and pharmacy consulted to continue Coumadin from PTA.  He missed his Coumadin dose yesterday and INR trended down to therapeutic level this AM.  No bleeding reported.  Home Coumadin regimen:  2.5mg  daily except 5mg  on MWF  Goal of Therapy:  INR 2-3 Monitor platelets by anticoagulation protocol: Yes   Plan:  Coumadin 4 mg PO today - may be able to put him back on home regiment tomorrow Daily PT / INR  Rober Minion, PharmD., MS Clinical Pharmacist Pager:  [phone] Thank you for allowing pharmacy to be part of this patients care team. 06/17/2017, 10:04 AM

## 2017-06-17 NOTE — Progress Notes (Signed)
PROGRESS NOTE    [name]  GHW:[id number] DOB: 30-Apr-1945 DOA: 06/15/2017 PCP: Biagio Borg, MD  Outpatient Specialists:     Brief Narrative: [name] is a 72 y.o. male with medical history significant of chronic diastolic heart failure, CAD, HTN, HLD, and type II DM.  Patient was admitted with acute kidney injury, hyperkalemia, hyponatremia that is mild, and anemia.  Patient is currently being cautiously hydrated.  Acute kidney injury is slowly resolving.  The patient is a poor historian.  No new complaints today.  No chest pain, no shortness of breath, no fever or chills.  Assessment & Plan:   Principal Problem:   Acute renal failure (ARF) (HCC) Active Problems:   Type 2 diabetes, uncontrolled, with neuropathy (HCC)   Hyperlipidemia   DIASTOLIC HEART FAILURE, CHRONIC   Atrial fibrillation (HCC)    #1-Acute renal failure:   -Likely multifactorial-overt diuresis, use of ACE inhibitor etc. - Creatine was 4.33 on admission with a baseline of 1.2.  Serum creatinine today is 3.38. -Renal ultrasound and urinalysis negative -Continue cautious hydration. -Monitor closely for volume overload -avoid nephrotoxic agents -hold home lasix and ACE I for now  -Serum creatinine is slowly improving.  #2 Chronic diastolic heart failure:  -Last ECHO obtained 03/22/2017 with EF 55-60%, mild LVH and indeterminate diastolic function (pt in A-fib), mildly disalted aortic root, PA peak pressure 35 mm Hg. -daily weight -heart healthy diet -monitor intake and output  -hold lasix and lisinopril due to #1  -continue coreg -Patient is currently compensated.  #3 Atrial fibrillation Heart rate 64 heart sounds irregular, home meds include coreg and coumadin -coumadin per pharmacy  -obtain EKG for toughness   #4 HTN: blood pressure have been on the low end of normal, home meds as noted above -continue Coreg with parameter -hold ACE I  #5 Type II DM:  - Optimize   DVT  prophylaxis: SQ heparin   Code Status: Do Not Resuscitate  Family Communication: No family present at bedside Disposition Plan: Home Consults called: None Admission status: Tele  Subjective: No new complaints.  No shortness of breath, no fever chills.  Objective: Vitals:   06/17/17 0350 06/17/17 0837 06/17/17 0850 06/17/17 1200  BP: 114/61 128/60  110/67  Pulse: 61 62  60  Resp: 20   18  Temp: 99.1 F (37.3 C)  (!) 101.4 F (38.6 C) 98.6 F (37 C)  TempSrc: Oral  Oral Oral  SpO2: 94%   95%  Weight: 97.7 kg (215 lb 6.2 oz)     Height:        Intake/Output Summary (Last 24 hours) at 06/17/2017 1207 Last data filed at 06/17/2017 0900 Gross per 24 hour  Intake 2885.83 ml  Output 2300 ml  Net 585.83 ml   Filed Weights   06/15/17 1637 06/16/17 1545 06/17/17 0350  Weight: 94.8 kg (209 lb) 97.5 kg (214 lb 15.2 oz) 97.7 kg (215 lb 6.2 oz)    Examination:  General exam: Appears calm and comfortable  Respiratory system: Clear to auscultation. Respiratory effort normal. Cardiovascular system: S1 & S2 heard, irregular Gastrointestinal system: Abdomen is nondistended, soft and nontender. No organomegaly or masses felt. Normal bowel sounds heard. Central nervous system: Alert and oriented. No focal neurological deficits. Extremities: Symmetric 5 x 5 power. Skin: No rashes, lesions or ulcers   Data Reviewed: I have personally reviewed following labs and imaging studies  CBC: Recent Labs  Lab 06/15/17 0808 06/15/17 1648 06/17/17 0050  WBC 6.3  6.4 5.2  NEUTROABS 4.3  --   --   HGB 10.1* 9.5* 9.3*  HCT 31.1* 31.2* 30.9*  MCV 79.1 82.8 82.0  PLT 145.0* 136* 812*   Basic Metabolic Panel: Recent Labs  Lab 06/15/17 0808 06/15/17 1648 06/17/17 0050  NA 133* 134* 133*  K 5.0 5.1 5.5*  CL 97 98* 102  CO2 26 26 22   GLUCOSE 116* 118* 113*  BUN 40* 43* 48*  CREATININE 3.24* 4.33* 3.38*  CALCIUM 8.2* 8.3* 8.1*   GFR: Estimated Creatinine Clearance: 23.9 mL/min (A) (by  C-G formula based on SCr of 3.38 mg/dL (H)). Liver Function Tests: Recent Labs  Lab 06/15/17 0808 06/16/17 0552  AST 50* 57*  ALT 51 56  ALKPHOS 51 51  BILITOT 0.4 0.6  PROT 6.2 6.2*  ALBUMIN 3.6 3.5   No results for input(s): LIPASE, AMYLASE in the last 168 hours. No results for input(s): AMMONIA in the last 168 hours. Coagulation Profile: Recent Labs  Lab 06/15/17 0808 06/16/17 0552 06/17/17 0050  INR 3.8* 2.53 2.38   Cardiac Enzymes: No results for input(s): CKTOTAL, CKMB, CKMBINDEX, TROPONINI in the last 168 hours. BNP (last 3 results) Recent Labs    02/28/17 0848 03/16/17 0923  PROBNP 283.0* 164.0*   HbA1C: Recent Labs    06/15/17 0808 06/15/17 1648  HGBA1C 8.3* 8.0*   CBG: Recent Labs  Lab 06/16/17 1203 06/16/17 1636 06/16/17 2134 06/17/17 0730 06/17/17 1152  GLUCAP 92 87 84 174* 122*   Lipid Profile: Recent Labs    06/15/17 0808  CHOL 94  HDL 29.10*  LDLCALC 49  TRIG 80.0  CHOLHDL 3   Thyroid Function Tests: Recent Labs    06/15/17 0808  TSH 1.11   Anemia Panel: No results for input(s): VITAMINB12, FOLATE, FERRITIN, TIBC, IRON, RETICCTPCT in the last 72 hours. Urine analysis:    Component Value Date/Time   COLORURINE YELLOW 06/15/2017 2228   APPEARANCEUR CLEAR 06/15/2017 2228   LABSPEC 1.014 06/15/2017 2228   PHURINE 5.0 06/15/2017 2228   GLUCOSEU NEGATIVE 06/15/2017 2228   GLUCOSEU NEGATIVE 06/15/2017 0857   HGBUR NEGATIVE 06/15/2017 2228   BILIRUBINUR NEGATIVE 06/15/2017 2228   KETONESUR NEGATIVE 06/15/2017 2228   PROTEINUR NEGATIVE 06/15/2017 2228   UROBILINOGEN 0.2 06/15/2017 0857   NITRITE NEGATIVE 06/15/2017 2228   LEUKOCYTESUR NEGATIVE 06/15/2017 2228   Sepsis Labs: @LABRCNTIP (procalcitonin:4,lacticidven:4)  )No results found for this or any previous visit (from the past 240 hour(s)).       Radiology Studies: US Renal  Result Date: 06/16/2017 CLINICAL DATA:  Initial evaluation for acute renal injury. EXAM:  RENAL / URINARY TRACT ULTRASOUND COMPLETE COMPARISON:  Prior CT from 07/11/2004 FINDINGS: Right Kidney: Length: 11.9 cm. Echogenicity within normal limits. No mass or hydronephrosis visualized. Left Kidney: Length: 11.7 cm. Echogenicity within normal limits. No mass or hydronephrosis visualized. Bladder: Appears normal for degree of bladder distention. IMPRESSION: Normal renal ultrasound.  No hydronephrosis. Electronically Signed   By: Jeannine Boga M.D.   On: 06/16/2017 06:57        Scheduled Meds: . atorvastatin  80 mg Oral Daily  . carvedilol  25 mg Oral BID WC  . donepezil  5 mg Oral QHS  . fluticasone  2 spray Each Nare Daily  . gabapentin  300 mg Oral TID  . insulin aspart  0-5 Units Subcutaneous QHS  . insulin aspart  0-9 Units Subcutaneous TID WC  . oxybutynin  10 mg Oral QHS  . tamsulosin  0.4 mg Oral  Daily  . warfarin  4 mg Oral ONCE-1800  . Warfarin - Pharmacist Dosing Inpatient   Does not apply q1800   Continuous Infusions: . sodium chloride 50 mL/hr at 06/16/17 1455     LOS: 1 day    Time spent: 35 minutes    Dana Allan, MD  [facility] Pager #: [phone] 7PM-7AM contact night coverage as above

## 2017-06-18 DIAGNOSIS — E875 Hyperkalemia: Secondary | ICD-10-CM

## 2017-06-18 DIAGNOSIS — I4891 Unspecified atrial fibrillation: Secondary | ICD-10-CM

## 2017-06-18 DIAGNOSIS — I5033 Acute on chronic diastolic (congestive) heart failure: Secondary | ICD-10-CM

## 2017-06-18 LAB — GLUCOSE, CAPILLARY
GLUCOSE-CAPILLARY: 287 mg/dL — AB (ref 65–99)
GLUCOSE-CAPILLARY: 249 mg/dL — AB (ref 65–99)
Glucose-Capillary: 206 mg/dL — ABNORMAL HIGH (ref 65–99)
Glucose-Capillary: 184 mg/dL — ABNORMAL HIGH (ref 65–99)

## 2017-06-18 LAB — PROTIME-INR
INR: 2.93
Prothrombin Time: 30.4 seconds — ABNORMAL HIGH (ref 11.4–15.2)

## 2017-06-18 MED ORDER — WARFARIN SODIUM 2 MG PO TABS
2.0000 mg | ORAL_TABLET | Freq: Once | ORAL | Status: AC
  Administered 2017-06-18: 2 mg via ORAL
  Filled 2017-06-18: qty 1

## 2017-06-18 MED ORDER — WARFARIN SODIUM 2.5 MG PO TABS: 2.5000 mg | ORAL_TABLET | Freq: Once | ORAL | Status: DC

## 2017-06-18 NOTE — Consult Note (Signed)
   [facility] Inpatient Consult   06/18/2017  GEOFF DACANAY 02-05-1945 [id number]  Patient is currently active with [facility] for chronic disease management services.  Patient has been engaged by a [place] community based plan of care has focused on disease management  For Diabetes Management and community resource support.  Went by to speak with patient however he was sound asleep. Staff reports patient is non adherent to diet plans. Patient's primary care provider is listed as Dr. Cathlean Cower and this office is listed to provide the post hospital transition of care follow up. Will continue to follow patient for progress and  disposition needs.  Of note, [facility] Care Management services does not replace or interfere with any services that are needed or arranged by inpatient case management or social work.  For additional questions or referrals please contact:  Natividad Brood, RN BSN [facility] Liaison  [phone] business mobile phone Toll free office [phone]

## 2017-06-18 NOTE — Progress Notes (Signed)
Patient refuses to have bed alarm on. Explained and educated

## 2017-06-18 NOTE — Progress Notes (Signed)
Inpatient Diabetes Program   AACE/ADA: New Consensus Statement on Inpatient Glycemic Control (2015)  Target Ranges:  Prepandial:   less than 140 mg/dL      Peak postprandial:   less than 180 mg/dL (1-2 hours)      Critically ill patients:  140 - 180 mg/dL   Spoke with patient about diabetes and home regimen for diabetes control. Patient did not make eye contact through most of the conversation and had a regular sprite at bedside. Per RN patient is noncompliant with a lot. Patient reports that he is followed by his PCP Dr. Marshall Cork for diabetes management and currently takes several oral meds. Patient reports that he takes his medication consistently and that he checks his glucose once a day at the same time each day and his glucose trends are 140-150.   Patient's next appointment with his PCP is in June. Encouraged patient to keep appointment to recheck A1c level. Discussed A1c result 8% this admission. Discussed glucose and A1C goals. Discussed importance of checking CBGs and maintaining good CBG control to prevent long-term and short-term complications. Explained how hyperglycemia leads to damage within blood vessels which lead to the common complications seen with uncontrolled diabetes.   Discussed impact of nutrition, exercise, stress, sickness, and medications on diabetes control. Discussed carbohydrates, carbohydrate goals per day and meal, along with portion sizes. Patient verbalized understanding of information discussed and he states that he has no further questions at this time related to diabetes.  After speaking with RN and looking at A1c level, patient either did not know his glucose was running higher in other parts of the day that he was not checking his trends, or was not revealing all information related to his diabetes.   Thanks,  Tama Headings RN, MSN, [facility] Inpatient Diabetes Coordinator Team Pager [phone] (8a-5p)

## 2017-06-18 NOTE — Progress Notes (Signed)
PROGRESS NOTE    [name]  CZY:[id number] DOB: 1945/05/11 DOA: 06/15/2017 PCP: Biagio Borg, MD  Outpatient Specialists:     Brief Narrative: [name] is a 72 y.o. male with medical history significant of chronic diastolic heart failure, CAD, HTN, HLD, and type II DM.  Patient was admitted with acute kidney injury, hyperkalemia, hyponatremia that is mild, and anemia.  Patient is currently being cautiously hydrated.  Acute kidney injury is slowly resolving.  The patient is a poor historian.  No new complaints today.  No chest pain, no shortness of breath, no fever or chills.  Assessment & Plan:   Principal Problem:   Acute renal failure (ARF) (HCC) Active Problems:   Type 2 diabetes, uncontrolled, with neuropathy (HCC)   Hyperlipidemia   DIASTOLIC HEART FAILURE, CHRONIC   Atrial fibrillation (HCC)    #1-Acute renal failure:   -Likely multifactorial-overt diuresis, use of ACE inhibitor etc. - Creatine was 4.33 on admission with a baseline of 1.2.  Serum creatinine today is 2.24 -Renal ultrasound and urinalysis negative -Continue cautious hydration. -Monitor closely for volume overload -avoid nephrotoxic agents -hold home lasix and ACE I for now  -Serum creatinine is improving.  #2 Chronic diastolic heart failure:  -Last ECHO obtained 03/22/2017 with EF 55-60%, mild LVH and indeterminate diastolic function (pt in A-fib), mildly disalted aortic root, PA peak pressure 35 mm Hg. -daily weight -heart healthy diet -monitor intake and output  -hold lasix and lisinopril due to #1  -continue coreg -Patient is currently compensated.  #3 Atrial fibrillation Heart rate 64 heart sounds irregular, home meds include coreg and coumadin -coumadin per pharmacy  -obtain EKG for toughness   #4 HTN: blood pressure have been on the low end of normal, home meds as noted above -continue Coreg with parameter -hold ACE I  #5 Type II DM:  - Optimize  #6  Hyperkalemia: Resolving Potassium is 5.2 today Continue to monitor   DVT prophylaxis: SQ heparin   Code Status: Do Not Resuscitate  Family Communication: No family present at bedside Disposition Plan: Home Consults called: None Admission status: Tele  Subjective: No new complaints.  No shortness of breath, no fever chills.  Objective: Vitals:   06/17/17 2019 06/18/17 0014 06/18/17 0513 06/18/17 1140  BP: (!) 144/71 (!) 158/68 (!) 148/80 131/79  Pulse: 61 60 62 (!) 58  Resp: 19 18 18 18   Temp: 99.7 F (37.6 C) 99.5 F (37.5 C) 98.8 F (37.1 C) 98.5 F (36.9 C)  TempSrc: Oral Oral Oral Oral  SpO2: 93% 90% 96% 98%  Weight:   93.7 kg (206 lb 8 oz)   Height:        Intake/Output Summary (Last 24 hours) at 06/18/2017 1841 Last data filed at 06/18/2017 1700 Gross per 24 hour  Intake 960 ml  Output 1250 ml  Net -290 ml   Filed Weights   06/16/17 1545 06/17/17 0350 06/18/17 0513  Weight: 97.5 kg (214 lb 15.2 oz) 97.7 kg (215 lb 6.2 oz) 93.7 kg (206 lb 8 oz)    Examination:  General exam: Appears calm and comfortable  Respiratory system: Clear to auscultation. Respiratory effort normal. Cardiovascular system: S1 & S2 heard, irregular Gastrointestinal system: Abdomen is nondistended, soft and nontender. No organomegaly or masses felt. Normal bowel sounds heard. Central nervous system: Alert and oriented. No focal neurological deficits. Extremities: Symmetric 5 x 5 power. Skin: No rashes, lesions or ulcers   Data Reviewed: I have personally reviewed following labs  and imaging studies  CBC: Recent Labs  Lab 06/15/17 0808 06/15/17 1648 06/17/17 0050  WBC 6.3 6.4 5.2  NEUTROABS 4.3  --   --   HGB 10.1* 9.5* 9.3*  HCT 31.1* 31.2* 30.9*  MCV 79.1 82.8 82.0  PLT 145.0* 136* 732*   Basic Metabolic Panel: Recent Labs  Lab 06/15/17 0808 06/15/17 1648 06/17/17 0050 06/17/17 1631  NA 133* 134* 133* 138  K 5.0 5.1 5.5* 5.2*  CL 97 98* 102 105  CO2 26 26 22 23    GLUCOSE 116* 118* 113* 114*  BUN 40* 43* 48* 37*  CREATININE 3.24* 4.33* 3.38* 2.24*  CALCIUM 8.2* 8.3* 8.1* 8.7*   GFR: Estimated Creatinine Clearance: 35.4 mL/min (A) (by C-G formula based on SCr of 2.24 mg/dL (H)). Liver Function Tests: Recent Labs  Lab 06/15/17 0808 06/16/17 0552  AST 50* 57*  ALT 51 56  ALKPHOS 51 51  BILITOT 0.4 0.6  PROT 6.2 6.2*  ALBUMIN 3.6 3.5   No results for input(s): LIPASE, AMYLASE in the last 168 hours. No results for input(s): AMMONIA in the last 168 hours. Coagulation Profile: Recent Labs  Lab 06/15/17 0808 06/16/17 0552 06/17/17 0050 06/18/17 0625  INR 3.8* 2.53 2.38 2.93   Cardiac Enzymes: No results for input(s): CKTOTAL, CKMB, CKMBINDEX, TROPONINI in the last 168 hours. BNP (last 3 results) Recent Labs    02/28/17 0848 03/16/17 0923  PROBNP 283.0* 164.0*   HbA1C: No results for input(s): HGBA1C in the last 72 hours. CBG: Recent Labs  Lab 06/17/17 1614 06/17/17 2254 06/18/17 0750 06/18/17 1123 06/18/17 1618  GLUCAP 109* 219* 287* 206* 184*   Lipid Profile: No results for input(s): CHOL, HDL, LDLCALC, TRIG, CHOLHDL, LDLDIRECT in the last 72 hours. Thyroid Function Tests: No results for input(s): TSH, T4TOTAL, FREET4, T3FREE, THYROIDAB in the last 72 hours. Anemia Panel: No results for input(s): VITAMINB12, FOLATE, FERRITIN, TIBC, IRON, RETICCTPCT in the last 72 hours. Urine analysis:    Component Value Date/Time   COLORURINE YELLOW 06/15/2017 2228   APPEARANCEUR CLEAR 06/15/2017 2228   LABSPEC 1.014 06/15/2017 2228   PHURINE 5.0 06/15/2017 2228   GLUCOSEU NEGATIVE 06/15/2017 2228   GLUCOSEU NEGATIVE 06/15/2017 0857   HGBUR NEGATIVE 06/15/2017 2228   BILIRUBINUR NEGATIVE 06/15/2017 2228   KETONESUR NEGATIVE 06/15/2017 2228   PROTEINUR NEGATIVE 06/15/2017 2228   UROBILINOGEN 0.2 06/15/2017 0857   NITRITE NEGATIVE 06/15/2017 2228   LEUKOCYTESUR NEGATIVE 06/15/2017 2228   Sepsis  Labs: @LABRCNTIP (procalcitonin:4,lacticidven:4)  ) Recent Results (from the past 240 hour(s))  Culture, blood (routine x 2)     Status: None (Preliminary result)   Collection Time: 06/17/17 12:56 AM  Result Value Ref Range Status   Specimen Description BLOOD RIGHT ARM  Final   Special Requests   Final    BOTTLES DRAWN AEROBIC AND ANAEROBIC Blood Culture results may not be optimal due to an inadequate volume of blood received in culture bottles   Culture   Final    NO GROWTH 1 DAY Performed at [facility], [place] [address] [place], [place] [address]    Report Status PENDING  Incomplete  Culture, blood (routine x 2)     Status: None (Preliminary result)   Collection Time: 06/17/17  1:07 AM  Result Value Ref Range Status   Specimen Description BLOOD RIGHT HAND  Final   Special Requests   Final    BOTTLES DRAWN AEROBIC AND ANAEROBIC Blood Culture results may not be optimal due to an  inadequate volume of blood received in culture bottles   Culture   Final    NO GROWTH 1 DAY Performed at [facility], [place] [address] [place], [place] [address]    Report Status PENDING  Incomplete         Radiology Studies: No results found.      Scheduled Meds: . atorvastatin  80 mg Oral Daily  . carvedilol  25 mg Oral BID WC  . donepezil  5 mg Oral QHS  . fluticasone  2 spray Each Nare Daily  . gabapentin  300 mg Oral TID  . insulin aspart  0-5 Units Subcutaneous QHS  . insulin aspart  0-9 Units Subcutaneous TID WC  . oxybutynin  10 mg Oral QHS  . tamsulosin  0.4 mg Oral Daily  . Warfarin - Pharmacist Dosing Inpatient   Does not apply q1800   Continuous Infusions: . sodium chloride 50 mL/hr at 06/16/17 1455     LOS: 2 days    Time spent: 25 minutes    Dana Allan, MD  [facility] Pager #: [phone] 7PM-7AM contact night coverage as above

## 2017-06-18 NOTE — Progress Notes (Signed)
ANTICOAGULATION CONSULT NOTE - Follow Up Consult  Pharmacy Consult:  Coumadin Indication: atrial fibrillation  Allergies  Allergen Reactions  . Crestor [Rosuvastatin Calcium] Other (See Comments)    Urination of blood  . Latex Rash and Other (See Comments)    Gloves cause welts on hands!!   Patient Measurements: Height: 5\' 11"  (180.3 cm) Weight: 206 lb 8 oz (93.7 kg) IBW/kg (Calculated) : 75.3  Vital Signs: Temp: 98.5 F (36.9 C) (05/20 1140) Temp Source: Oral (05/20 1140) BP: 131/79 (05/20 1140) Pulse Rate: 58 (05/20 1140)  Labs: Recent Labs    06/15/17 1648 06/16/17 0552 06/17/17 0050 06/17/17 1631 06/18/17 0625  HGB 9.5*  --  9.3*  --   --   HCT 31.2*  --  30.9*  --   --   PLT 136*  --  124*  --   --   LABPROT  --  27.1* 25.8*  --  30.4*  INR  --  2.53 2.38  --  2.93  CREATININE 4.33*  --  3.38* 2.24*  --    Estimated Creatinine Clearance: 35.4 mL/min (A) (by C-G formula based on SCr of 2.24 mg/dL (H)).  Medical History: Past Medical History:  Diagnosis Date  . Aortic root dilatation ([place]) 02/02/2011  . Arthritis    "lower back; going back down both my sciatic nerves"  . 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 BENIGN PROSTATIC HYPERTROPHY 08/29/2006   takes Flomax daily  . 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 Cataracts, bilateral    immature  . CHF (congestive heart failure) (HCC)    takes Lasix daily  . Chronic back pain    HNP   . CORONARY ARTERY DISEASE 08/29/2006   takes Coumadin daily  . DEPRESSION 08/29/2006  . DIABETES MELLITUS, TYPE II 08/29/2006   takes Metformin,Januvia,and Glipizide  daily  . DIASTOLIC HEART FAILURE, CHRONIC 06/09/2008  . GOUT 04/22/2007   takes Allopurinol daily  . History of migraine    41yrs ago  . HYPERLIPIDEMIA 08/29/2006   takes Atorvastatin daily  . HYPERTENSION 08/29/2006   takes Lisinopril daily  . INSOMNIA-SLEEP DISORDER-UNSPEC 10/23/2007  . Left lumbar radiculopathy 05/30/2010  . LUMBAR  RADICULOPATHY, RIGHT 06/10/2007  . Muscle spasm    takes Zanaflex daily  . Myocardial infarction ([place]) 12/27/10   "I've had several MIs"  . 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 PACEMAKER, PERMANENT 03/04/2008   pt denies this date  . Peripheral neuropathy    takes Gabapentin daily  . Presence of permanent cardiac pacemaker   . Shortness of breath dyspnea    "all my life" with exertion  . Sleep apnea    "if I lay flat I quit breathing; HOB up I'm fine"   Assessment: 40 YOM sent to the ED from PCP office due to worsening renal function.  Patient has a history of Afib and pharmacy consulted to continue Coumadin from PTA.  He missed his Coumadin dose on 5/18 and INR trended down to therapeutic level . Today INR is 2.93, therapeutic .  No bleeding reported. No drug interactions noted.   Home Coumadin regimen:  2.5mg  daily except 5mg  on MWF  Goal of Therapy:  INR 2-3 Monitor platelets by anticoagulation protocol: Yes   Plan:  Coumadin 2 mg PO today  Daily PT / INR Thank you for allowing pharmacy to be part of this patients care team. 06/18/2017, 1:37 PM

## 2017-06-18 NOTE — Progress Notes (Signed)
Inpatient Diabetes Program Recommendations  AACE/ADA: New Consensus Statement on Inpatient Glycemic Control (2015)  Target Ranges:  Prepandial:   less than 140 mg/dL      Peak postprandial:   less than 180 mg/dL (1-2 hours)      Critically ill patients:  140 - 180 mg/dL   Lab Results  Component Value Date   GLUCAP 206 (H) 06/18/2017   HGBA1C 8.0 (H) 06/15/2017    Review of Glycemic Control Results for [name], [name] (MRN [id number]) as of 06/18/2017 13:10  Ref. Range 06/17/2017 22:54 06/18/2017 07:50 06/18/2017 11:23  Glucose-Capillary Latest Ref Range: 65 - 99 mg/dL 219 (H) 287 (H) 206 (H)   Diabetes history: Type 2 DM Outpatient Diabetes medications: Januvia 100 mg QD, Metformin 1000 mg BID, Glipizide 10 mg in AM Current orders for Inpatient glycemic control: Novolog 0-9 units TID, Novolog 0-5 units   Inpatient Diabetes Program Recommendations:    Last creatinine on 5/19 at 1631 was 2.24 which is an improvement from 4.33, thus wondering if this is impacting current BS trends? AMFS was 287 mg/dL. Consider adding Lantus 10 units QD (93.6 kg x 0.1).   Thanks, Bronson Curb, MSN, [facility] Diabetes Coordinator [phone] (8a-5p)

## 2017-06-19 DIAGNOSIS — E1165 Type 2 diabetes mellitus with hyperglycemia: Secondary | ICD-10-CM

## 2017-06-19 DIAGNOSIS — E1140 Type 2 diabetes mellitus with diabetic neuropathy, unspecified: Secondary | ICD-10-CM

## 2017-06-19 DIAGNOSIS — I5032 Chronic diastolic (congestive) heart failure: Secondary | ICD-10-CM

## 2017-06-19 LAB — BASIC METABOLIC PANEL
Anion gap: 8 (ref 5–15)
BUN: 11 mg/dL (ref 6–20)
CALCIUM: 8.3 mg/dL — AB (ref 8.9–10.3)
CO2: 24 mmol/L (ref 22–32)
CREATININE: 0.84 mg/dL (ref 0.61–1.24)
Chloride: 108 mmol/L (ref 101–111)
GFR calc Af Amer: >60 mL/min (ref >60)
GLUCOSE: 165 mg/dL — AB (ref 65–99)
Potassium: 4.4 mmol/L (ref 3.5–5.1)
Sodium: 140 mmol/L (ref 135–145)

## 2017-06-19 LAB — GLUCOSE, CAPILLARY
GLUCOSE-CAPILLARY: 164 mg/dL — AB (ref 65–99)
Glucose-Capillary: 187 mg/dL — ABNORMAL HIGH (ref 65–99)

## 2017-06-19 LAB — PROTIME-INR
INR: 3.32
PROTHROMBIN TIME: 33.4 s — AB (ref 11.4–15.2)

## 2017-06-19 MED ORDER — FUROSEMIDE 40 MG PO TABS: 80.0000 mg | ORAL_TABLET | Freq: Two times a day (BID) | ORAL | 2 refills | Status: DC

## 2017-06-19 NOTE — Progress Notes (Signed)
CRITICAL VALUE ALERT  Critical Value: creatinine 0.84  Date & Time Notied: 06/19/2017 at 1130am  Provider Notified:   MD made aware

## 2017-06-19 NOTE — Discharge Summary (Signed)
Physician Discharge Summary  [name] UTM:[id number] DOB: 11/09/1945 DOA: 06/15/2017  PCP: Randall Borg, MD  Admit date: 06/15/2017 Discharge date: 06/19/2017  Time spent: 35* minutes  Recommendations for Outpatient Follow-up:  1. Follow-up cardiology in 2 weeks 2.    Discharge Diagnoses:  Principal Problem:   Acute renal failure (ARF) (HCC) Active Problems:   Type 2 diabetes, uncontrolled, with neuropathy ([place])   Hyperlipidemia   DIASTOLIC HEART FAILURE, CHRONIC   Atrial fibrillation [facility])   Discharge Condition: Stable  Diet recommendation: Heart healthy diet  Filed Weights   06/17/17 0350 06/18/17 0513 06/19/17 0626  Weight: 97.7 kg (215 lb 6.2 oz) 93.7 kg (206 lb 8 oz) 93.7 kg (206 lb 8 oz)    History of present illness:  72 y.o.malewith medical history significant ofchronic diastolic heart failure, CAD, HTN, HLD, and type II DM.  Patient was admitted with acute kidney injury, hyperkalemia, hyponatremia that is mild, and anemia.  Patient is currently being cautiously hydrated.  Acute kidney injury is slowly resolving.  The patient is a poor historian.   Hospital Course:   AcuteKidney injury-patient came with creatinine of 4.33 on admission,, his dose of Lasix was changed in February to 120 mg twice a day.  After holding Lasix creatinine is back to 0.83.  Today will discharge patient home on lower dose of Lasix 80 mg p.o. twice daily which he was taking before February.  Chronic diastolic CHF-patient is currently euvolemic, Lasix dose has been reduced to 80 mg p.o. twice daily as above.  Continue lisinopril.    Atrial fibrillation-heart rate is controlled, continue Coreg,  Hypertension-blood pressure stable, continue Coreg  Diabetes mellitus-continue home medications.   Procedures:  None   Consultations:  None   Discharge Exam: Vitals:   06/19/17 0416 06/19/17 0824  BP: 127/72 (!) 145/93  Pulse: 60 60  Resp:  16  Temp: 98.5 F (36.9 C) 97.8  F (36.6 C)  SpO2: 95% 95%    General:  appears in no acute distress  Cardiovascular: S1-S2, regular Respiratory: Clear bilaterally  Discharge Instructions   Discharge Instructions    Diet - low sodium heart healthy   Complete by:  As directed    Increase activity slowly   Complete by:  As directed      Allergies as of 06/19/2017      Reactions   Crestor [rosuvastatin Calcium] Other (See Comments)   Urination of blood   Latex Rash, Other (See Comments)   Gloves cause welts on hands!!      Medication List    TAKE these medications   albuterol 108 (90 Base) MCG/ACT inhaler Commonly known as:  PROVENTIL HFA;VENTOLIN HFA Inhale 2 puffs into the lungs every 6 (six) hours as needed for wheezing or shortness of breath.   allopurinol 300 MG tablet Commonly known as:  ZYLOPRIM take 1 tablet by mouth once daily   atorvastatin 80 MG tablet Commonly known as:  LIPITOR Take 1 tablet (80 mg total) by mouth daily.   carvedilol 25 MG tablet Commonly known as:  COREG Take 1 tablet (25 mg total) by mouth 2 (two) times daily.   donepezil 5 MG tablet Commonly known as:  ARICEPT Take 1 tablet (5 mg total) by mouth at bedtime.   fluticasone 50 MCG/ACT nasal spray Commonly known as:  FLONASE USE 2 SPRAYS IN EACH  NOSTRIL EVERY DAY   furosemide 40 MG tablet Commonly known as:  LASIX Take 2 tablets (80 mg total) by  mouth 2 (two) times daily. What changed:  how much to take   gabapentin 300 MG capsule Commonly known as:  NEURONTIN TAKE 2 CAPSULES BY MOUTH 3  TIMES DAILY What changed:  See the new instructions.   glipiZIDE 10 MG 24 hr tablet Commonly known as:  GLUCOTROL XL Take 1 tablet (10 mg total) by mouth daily with breakfast.   glucose blood test strip Commonly known as:  ONE TOUCH ULTRA TEST CHECK BLOOD SUGAR TWO TIMES DAILY   HYDROcodone-acetaminophen 10-325 MG tablet Commonly known as:  NORCO Take 1 tablet by mouth daily as needed. What changed:  reasons to  take this   lisinopril 2.5 MG tablet Commonly known as:  PRINIVIL,ZESTRIL Take 1 tablet (2.5 mg total) by mouth daily.   metFORMIN 500 MG tablet Commonly known as:  GLUCOPHAGE TAKE 2 TABLETS BY MOUTH TWO TIMES DAILY   multivitamin with minerals Tabs tablet Take 1 tablet by mouth every morning.   ONE TOUCH ULTRA 2 w/Device Kit Use as directed   ONETOUCH DELICA LANCETS 13Y Misc USE AS DIRECTED THREE TIMES DAILY TO CHECK BLOOD SUGAR   oxybutynin 10 MG 24 hr tablet Commonly known as:  DITROPAN XL Take 1 tablet (10 mg total) by mouth at bedtime.   potassium chloride SA 20 MEQ tablet Commonly known as:  K-DUR,KLOR-CON Take 1 tablet (20 mEq total) by mouth every evening.   sitaGLIPtin 100 MG tablet Commonly known as:  JANUVIA Take 1 tablet (100 mg total) by mouth daily.   tamsulosin 0.4 MG Caps capsule Commonly known as:  FLOMAX TAKE 1 CAPSULE BY MOUTH  EVERY 12 HOURS   Vitamin D 1000 units capsule Take 1,000 Units by mouth 2 (two) times daily. Reported on 02/15/2015   warfarin 5 MG tablet Commonly known as:  COUMADIN Take as directed. If you are unsure how to take this medication, talk to your nurse or doctor. Original instructions:  Take 1/2 tablet daily except 1 tablet on Mondays, Wednesdays and Fridays What changed:    how much to take  how to take this  when to take this  additional instructions      Allergies  Allergen Reactions  . Crestor [Rosuvastatin Calcium] Other (See Comments)    Urination of blood  . Latex Rash and Other (See Comments)    Gloves cause welts on hands!!   Follow-up Information    Randall Perla, MD. Schedule an appointment as soon as possible for a visit in 2 week(s).   Specialty:  Cardiology Contact information: [address] [place] [address] [phone]            The results of significant diagnostics from this hospitalization (including imaging, microbiology, ancillary and laboratory) are listed  below for reference.    Significant Diagnostic Studies: Ct Cervical Spine W Contrast  Result Date: 05/31/2017 CLINICAL DATA:  Neck pain.  LEFT arm pain. FLUOROSCOPY TIME:  51 seconds corresponding to a Dose Area Product of 260.9 Gy*m2 PROCEDURE: LUMBAR PUNCTURE FOR CERVICAL MYELOGRAM After thorough discussion of risks and benefits of the procedure including bleeding, infection, injury to nerves, blood vessels, adjacent structures as well as headache and CSF leak, written and oral informed consent was obtained. Consent was obtained by Dr. Rolla Flatten. We discussed the high likelihood of obtaining a diagnostic study. Patient was positioned prone on the fluoroscopy table. Local anesthesia was provided with 1% lidocaine without epinephrine after prepped and draped in the usual sterile fashion. Puncture was performed at  L3-L4 using a 3 1/2 inch 20 gauge spinal needle via RIGHT paramedian approach. Using a single pass through the dura, the needle was placed within the thecal sac, with return of clear CSF. 10 mL of Isovue M-300 was injected into the thecal sac, with normal opacification of the nerve roots and cauda equina consistent with free flow within the subarachnoid space. The patient was then moved to the trendelenburg position and contrast flowed into the Cervical spine region. I personally performed the lumbar puncture and administered the intrathecal contrast. I also personally supervised acquisition of the myelogram images. TECHNIQUE: Contiguous axial images were obtained through the Cervical spine after the intrathecal infusion of infusion. Coronal and sagittal reconstructions were obtained of the axial image sets. FINDINGS: CERVICAL MYELOGRAM FINDINGS: Good opacification of the cervical subarachnoid space. No significant nerve root cut off. Shallow ventral defects at C3-4 and C4-5. Anatomic alignment. No dynamic instability through standing flexion extension. CT CERVICAL MYELOGRAM FINDINGS: Alignment:  Straightening of the normal cervical lordosis. Trace anterolisthesis C2-3. Vertebrae: No worrisome osseous lesion. Cord: Mild cord flattening at C3-4 and C4-5. Posterior Fossa: No tonsillar herniation.  Mega cisterna magna. Vertebral Arteries: Not assessed. Paraspinal tissues: Unremarkable. Pacemaker. No lung apex lesion. Carotid atherosclerosis. Disc levels: C2-3: Mild facet mediated anterolisthesis 1-2 mm. Severe LEFT-sided facet arthropathy. LEFT-sided uncinate spurring with borderline LEFT C3 foraminal narrowing. C3-4: Ossification of posterior longitudinal ligament. LEFT greater than RIGHT facet arthropathy. Mild stenosis with slight ventral cord deformity. No impingement. C4-5: Ossification of posterior longitudinal ligament. Mild stenosis with slight ventral cord deformity. Mild facet arthropathy. No impingement. C5-6: Mild stenosis with slight effacement of the ventral subarachnoid space. BILATERAL facet arthropathy. No impingement. C6-7:  Unremarkable. C7-T1:  Unremarkable. IMPRESSION: Minor cervical spondylosis, without significant stenosis, or foraminal narrowing. Multilevel facet arthropathy, worse on the LEFT. Essentially anatomic alignment without dynamic instability. Atherosclerosis. Electronically Signed   By: Staci Righter M.D.   On: 05/31/2017 11:04   US Renal  Result Date: 06/16/2017 CLINICAL DATA:  Initial evaluation for acute renal injury. EXAM: RENAL / URINARY TRACT ULTRASOUND COMPLETE COMPARISON:  Prior CT from 07/11/2004 FINDINGS: Right Kidney: Length: 11.9 cm. Echogenicity within normal limits. No mass or hydronephrosis visualized. Left Kidney: Length: 11.7 cm. Echogenicity within normal limits. No mass or hydronephrosis visualized. Bladder: Appears normal for degree of bladder distention. IMPRESSION: Normal renal ultrasound.  No hydronephrosis. Electronically Signed   By: Jeannine Boga M.D.   On: 06/16/2017 06:57   Dg Myelography Lumbar Inj Cervical  Result Date:  05/31/2017 CLINICAL DATA:  Neck pain.  LEFT arm pain. FLUOROSCOPY TIME:  51 seconds corresponding to a Dose Area Product of 260.9 Gy*m2 PROCEDURE: LUMBAR PUNCTURE FOR CERVICAL MYELOGRAM After thorough discussion of risks and benefits of the procedure including bleeding, infection, injury to nerves, blood vessels, adjacent structures as well as headache and CSF leak, written and oral informed consent was obtained. Consent was obtained by Dr. Rolla Flatten. We discussed the high likelihood of obtaining a diagnostic study. Patient was positioned prone on the fluoroscopy table. Local anesthesia was provided with 1% lidocaine without epinephrine after prepped and draped in the usual sterile fashion. Puncture was performed at L3-L4 using a 3 1/2 inch 20 gauge spinal needle via RIGHT paramedian approach. Using a single pass through the dura, the needle was placed within the thecal sac, with return of clear CSF. 10 mL of Isovue M-300 was injected into the thecal sac, with normal opacification of the nerve roots and cauda equina consistent with free  flow within the subarachnoid space. The patient was then moved to the trendelenburg position and contrast flowed into the Cervical spine region. I personally performed the lumbar puncture and administered the intrathecal contrast. I also personally supervised acquisition of the myelogram images. TECHNIQUE: Contiguous axial images were obtained through the Cervical spine after the intrathecal infusion of infusion. Coronal and sagittal reconstructions were obtained of the axial image sets. FINDINGS: CERVICAL MYELOGRAM FINDINGS: Good opacification of the cervical subarachnoid space. No significant nerve root cut off. Shallow ventral defects at C3-4 and C4-5. Anatomic alignment. No dynamic instability through standing flexion extension. CT CERVICAL MYELOGRAM FINDINGS: Alignment: Straightening of the normal cervical lordosis. Trace anterolisthesis C2-3. Vertebrae: No worrisome osseous  lesion. Cord: Mild cord flattening at C3-4 and C4-5. Posterior Fossa: No tonsillar herniation.  Mega cisterna magna. Vertebral Arteries: Not assessed. Paraspinal tissues: Unremarkable. Pacemaker. No lung apex lesion. Carotid atherosclerosis. Disc levels: C2-3: Mild facet mediated anterolisthesis 1-2 mm. Severe LEFT-sided facet arthropathy. LEFT-sided uncinate spurring with borderline LEFT C3 foraminal narrowing. C3-4: Ossification of posterior longitudinal ligament. LEFT greater than RIGHT facet arthropathy. Mild stenosis with slight ventral cord deformity. No impingement. C4-5: Ossification of posterior longitudinal ligament. Mild stenosis with slight ventral cord deformity. Mild facet arthropathy. No impingement. C5-6: Mild stenosis with slight effacement of the ventral subarachnoid space. BILATERAL facet arthropathy. No impingement. C6-7:  Unremarkable. C7-T1:  Unremarkable. IMPRESSION: Minor cervical spondylosis, without significant stenosis, or foraminal narrowing. Multilevel facet arthropathy, worse on the LEFT. Essentially anatomic alignment without dynamic instability. Atherosclerosis. Electronically Signed   By: Staci Righter M.D.   On: 05/31/2017 11:04    Microbiology: Recent Results (from the past 240 hour(s))  Culture, blood (routine x 2)     Status: None (Preliminary result)   Collection Time: 06/17/17 12:56 AM  Result Value Ref Range Status   Specimen Description BLOOD RIGHT ARM  Final   Special Requests   Final    BOTTLES DRAWN AEROBIC AND ANAEROBIC Blood Culture results may not be optimal due to an inadequate volume of blood received in culture bottles   Culture   Final    NO GROWTH 2 DAYS Performed at [facility], [place] [address], [place], [place] [address]    Report Status PENDING  Incomplete  Culture, blood (routine x 2)     Status: None (Preliminary result)   Collection Time: 06/17/17  1:07 AM  Result Value Ref Range Status   Specimen Description BLOOD RIGHT HAND  Final    Special Requests   Final    BOTTLES DRAWN AEROBIC AND ANAEROBIC Blood Culture results may not be optimal due to an inadequate volume of blood received in culture bottles   Culture   Final    NO GROWTH 2 DAYS Performed at [facility], [place] [address] [place], [place] [address]    Report Status PENDING  Incomplete     Labs: Basic Metabolic Panel: Recent Labs  Lab 06/15/17 0808 06/15/17 1648 06/17/17 0050 06/17/17 1631 06/19/17 0533  NA 133* 134* 133* 138 140  K 5.0 5.1 5.5* 5.2* 4.4  CL 97 98* 102 105 108  CO2 '26 26 22 23 24  ' GLUCOSE 116* 118* 113* 114* 165*  BUN 40* 43* 48* 37* 11  CREATININE 3.24* 4.33* 3.38* 2.24* 0.84  CALCIUM 8.2* 8.3* 8.1* 8.7* 8.3*   Liver Function Tests: Recent Labs  Lab 06/15/17 0808 06/16/17 0552  AST 50* 57*  ALT 51 56  ALKPHOS 51 51  BILITOT 0.4 0.6  PROT 6.2 6.2*  ALBUMIN 3.6 3.5   No results for input(s): LIPASE, AMYLASE in the last 168 hours. No results for input(s): AMMONIA in the last 168 hours. CBC: Recent Labs  Lab 06/15/17 0808 06/15/17 1648 06/17/17 0050  WBC 6.3 6.4 5.2  NEUTROABS 4.3  --   --   HGB 10.1* 9.5* 9.3*  HCT 31.1* 31.2* 30.9*  MCV 79.1 82.8 82.0  PLT 145.0* 136* 124*   Cardiac Enzymes: No results for input(s): CKTOTAL, CKMB, CKMBINDEX, TROPONINI in the last 168 hours. BNP: BNP (last 3 results) No results for input(s): BNP in the last 8760 hours.  ProBNP (last 3 results) Recent Labs    02/28/17 0848 03/16/17 0923  PROBNP 283.0* 164.0*    CBG: Recent Labs  Lab 06/18/17 1123 06/18/17 1618 06/18/17 2200 06/19/17 0743 06/19/17 1157  GLUCAP 206* 184* 249* 164* 187*       Signed:  Oswald Hillock MD.  [facility] 06/19/2017, 3:16 PM

## 2017-06-19 NOTE — Plan of Care (Signed)
  Problem: Clinical Measurements: Goal: Diagnostic test results will improve Outcome: Progressing   Problem: Clinical Measurements: Goal: Cardiovascular complication will be avoided Outcome: Progressing   Problem: Activity: Goal: Risk for activity intolerance will decrease Outcome: Progressing   

## 2017-06-19 NOTE — Progress Notes (Signed)
ANTICOAGULATION CONSULT NOTE - Follow Up Consult  Pharmacy Consult:  Coumadin Indication: atrial fibrillation  Allergies  Allergen Reactions  . Crestor [Rosuvastatin Calcium] Other (See Comments)    Urination of blood  . Latex Rash and Other (See Comments)    Gloves cause welts on hands!!   Patient Measurements: Height: 5\' 11"  (180.3 cm) Weight: 206 lb 8 oz (93.7 kg) IBW/kg (Calculated) : 75.3  Vital Signs: Temp: 97.8 F (36.6 C) (05/21 0824) Temp Source: Oral (05/21 0416) BP: 145/93 (05/21 0824) Pulse Rate: 60 (05/21 0824)  Labs: Recent Labs    06/17/17 0050 06/17/17 1631 06/18/17 0625 06/19/17 0533  HGB 9.3*  --   --   --   HCT 30.9*  --   --   --   PLT 124*  --   --   --   LABPROT 25.8*  --  30.4* 33.4*  INR 2.38  --  2.93 3.32  CREATININE 3.38* 2.24*  --   --    Estimated Creatinine Clearance: 35.4 mL/min (A) (by C-G formula based on SCr of 2.24 mg/dL (H)).  Medical History: Past Medical History:  Diagnosis Date  . Aortic root dilatation ([place]) 02/02/2011  . Arthritis    "lower back; going back down both my sciatic nerves"  . 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 BENIGN PROSTATIC HYPERTROPHY 08/29/2006   takes Flomax daily  . 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 Cataracts, bilateral    immature  . CHF (congestive heart failure) (HCC)    takes Lasix daily  . Chronic back pain    HNP   . CORONARY ARTERY DISEASE 08/29/2006   takes Coumadin daily  . DEPRESSION 08/29/2006  . DIABETES MELLITUS, TYPE II 08/29/2006   takes Metformin,Januvia,and Glipizide  daily  . DIASTOLIC HEART FAILURE, CHRONIC 06/09/2008  . GOUT 04/22/2007   takes Allopurinol daily  . History of migraine    46yrs ago  . HYPERLIPIDEMIA 08/29/2006   takes Atorvastatin daily  . HYPERTENSION 08/29/2006   takes Lisinopril daily  . INSOMNIA-SLEEP DISORDER-UNSPEC 10/23/2007  . Left lumbar radiculopathy 05/30/2010  . LUMBAR RADICULOPATHY, RIGHT 06/10/2007  . Muscle spasm    takes  Zanaflex daily  . Myocardial infarction ([place]) 12/27/10   "I've had several MIs"  . 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 PACEMAKER, PERMANENT 03/04/2008   pt denies this date  . Peripheral neuropathy    takes Gabapentin daily  . Presence of permanent cardiac pacemaker   . Shortness of breath dyspnea    "all my life" with exertion  . Sleep apnea    "if I lay flat I quit breathing; HOB up I'm fine"   Assessment: 67 YOM sent to the ED from PCP office due to worsening renal function.  Patient has a history of Afib and pharmacy consulted to continue Coumadin from PTA. INR was supra-therapeutic on admit date 5/17, thus no dose given 5/17 and INR trended down to therapeutic level . Today INR increased to 3.32, supratherapeutic .  No bleeding reported. No drug interactions noted.  INR trend:  3.8>2.53>2.38>2.93>3.32  Home Coumadin regimen:  2.5mg  daily except 5mg  on MWF  Goal of Therapy:  INR 2-3 Monitor platelets by anticoagulation protocol: Yes   Plan:  Will hold coumadin today  Daily PT / INR  Thank you for allowing pharmacy to be part of this patients care team.  Nicole Cella, [place] Clinical Pharmacist Pager: [phone] 08:00-15:29: [phone] 15:30-22:15: [phone] After 22:15: [place] [phone] 06/19/2017, 10:49 AM

## 2017-06-19 NOTE — Progress Notes (Signed)
Inpatient Diabetes Program Recommendations  AACE/ADA: New Consensus Statement on Inpatient Glycemic Control (2015)  Target Ranges:  Prepandial:   less than 140 mg/dL      Peak postprandial:   less than 180 mg/dL (1-2 hours)      Critically ill patients:  140 - 180 mg/dL   Results for WOODARD, PERRELL (MRN [id number]) as of 06/19/2017 10:39  Ref. Range 06/18/2017 07:50 06/18/2017 11:23 06/18/2017 16:18 06/18/2017 22:00 06/19/2017 07:43  Glucose-Capillary Latest Ref Range: 65 - 99 mg/dL 287 (H) 206 (H) 184 (H) 249 (H) 164 (H)   Review of Glycemic Control  Current orders for Inpatient glycemic control: Novolog 0-9 units TID with meals, Novolog 0-5 units QHS  Inpatient Diabetes Program Recommendations:  Insulin - Meal Coverage: Please consider ordering Novolog 3 units TID with meals for meal coverage if patient eats at least 50% of meals.  Thanks, Barnie Alderman, RN, MSN, CDE Diabetes Coordinator Inpatient Diabetes Program [phone] (Team Pager from 8am to 5pm)

## 2017-06-20 ENCOUNTER — Telehealth: Payer: Self-pay | Admitting: *Deleted

## 2017-06-20 ENCOUNTER — Telehealth: Payer: Self-pay | Admitting: Internal Medicine

## 2017-06-20 ENCOUNTER — Other Ambulatory Visit: Payer: Self-pay

## 2017-06-20 NOTE — Telephone Encounter (Signed)
Please schedule OV for pt. Thanks! 

## 2017-06-20 NOTE — Telephone Encounter (Signed)
From CRM: CRM for notification. See Telephone encounter for: 06/20/17.  Pt. Wife calling to see what med. Should pt. Take for his gout pt. Right foot is swollen this morning Can be reached at  [phone]

## 2017-06-20 NOTE — Telephone Encounter (Signed)
Hard to say if this is gout, since there is no mention of pain involved, and pt was just seen in ED may 17 with AKI and CHF  Please consider ROV for persistent or worsening symptoms

## 2017-06-20 NOTE — Telephone Encounter (Signed)
Pt was on TCM report admitted 06/15/17 for with acute kidney injury, hyperkalemia, hyponatremia that is mild, and anemia.The patient was hydrated, and acute kidney injury slowly resolved. Pt D/C 06/19/17  home on lower dose of Lasix 80 mg p.o. twice daily which he was taking before February. Will be seeing cardiology in 2 weeks./lmb

## 2017-06-20 NOTE — Telephone Encounter (Signed)
Copied from [place] [id number]. Topic: Quick Communication - See Telephone Encounter >> Jun 20, 2017  9:23 AM Cleaster Corin, NT wrote: CRM for notification. See Telephone encounter for: 06/20/17.

## 2017-06-21 ENCOUNTER — Other Ambulatory Visit: Payer: Self-pay | Admitting: *Deleted

## 2017-06-21 NOTE — Telephone Encounter (Signed)
appt made

## 2017-06-21 NOTE — Patient Outreach (Signed)
[name]) Care Management  06/21/2017  [name] 16-Mar-1945 [id number]  [name] is a 72 y.o.malewith medical history which includes chronic diastolic heart failure, CAD, HTN, HLD, and type II DM. [name] was admiited to the hospital on 06/15/17 for treatment of acute kidney injury, hyperkalemia, and hyponatremia. [name] was discharged from the hospital on 06/19/17 and referred to [facility] for transition of care services. He was active with our health coach but will now be followed weekly for transition of care services.   I called [name] today and he was resting but his wife spoke with me to let me know that [name] is feeling somewhat better. He has a scheduled post hospital follow up appointment with [name] on Tuesday @ 1pm. Home health services were not ordered at hospital discharge. We reviewed medications and [name] has and is taking all prescribed medications.   Plan: [name] will be contacted weekly for ongoing transition of care services.   [facility] CM Care Plan Problem One     Most Recent Value  Care Plan Problem One  knowledge deficit related to self management of diabetes and heart failure  Role Documenting the Problem One  [facility] [place] for Problem One  Active  [facility] Long Term Goal   Patient will continue to keep blood sugars less than 150 within the next 90 days.   [facility] Long Term Goal Start Date  05/17/17  Inter[facility] for Problem One Long Term Goal  reviewed hospital discharge plan   [facility] CM Short Term Goal #1   Patient will weigh daily in the next 30 days  [facility] CM Short Term Goal #1 Start Date  06/21/17  [facility] CM Short Term Goal #2   In next 30 days the patient will verbalize following the instructions of his physician regarding the use of his arm and hand  [facility] CM Short Term Goal #2 Start Date  06/21/17    [facility] CM Care Plan Problem Two     Most Recent Value  Care Plan Problem Two  Knowledge  Deficits related to understanding of plan of care for long term management of AKI/CKD  Role Documenting the Problem [place] for Problem Two  Active  Inter[facility] for Problem Two Long Term Goal   reviewed discharge plan and upcoming provider appointment  [place] Term Goal  Over the next 31 days, patient will verbalize understanding of plan of care for long term management of CKD  [facility] Long Term Goal Start Date  06/21/17  [facility] CM Short Term Goal #1   Over the next 14 days, patient will attend scheudled provider appointment and will voice questions about management of kidney disease  [facility] CM Short Term Goal #1 Start Date  06/21/17  Inter[facility] for Short Term Goal #2   reviewed upcoming provider schedule appointment      Janalyn Shy [place] [facility]  [phone]

## 2017-06-22 LAB — CULTURE, BLOOD (ROUTINE X 2)
Culture: NO GROWTH
Culture: NO GROWTH

## 2017-06-26 ENCOUNTER — Encounter: Payer: Self-pay | Admitting: Internal Medicine

## 2017-06-26 ENCOUNTER — Ambulatory Visit (INDEPENDENT_AMBULATORY_CARE_PROVIDER_SITE_OTHER): Payer: Medicare Other | Admitting: Internal Medicine

## 2017-06-26 VITALS — BP 102/68 | HR 91 | Temp 97.8°F | Ht 71.0 in | Wt 205.0 lb

## 2017-06-26 DIAGNOSIS — E1165 Type 2 diabetes mellitus with hyperglycemia: Secondary | ICD-10-CM

## 2017-06-26 DIAGNOSIS — IMO0002 Reserved for concepts with insufficient information to code with codable children: Secondary | ICD-10-CM

## 2017-06-26 DIAGNOSIS — D649 Anemia, unspecified: Secondary | ICD-10-CM | POA: Diagnosis not present

## 2017-06-26 DIAGNOSIS — N182 Chronic kidney disease, stage 2 (mild): Secondary | ICD-10-CM

## 2017-06-26 DIAGNOSIS — E1140 Type 2 diabetes mellitus with diabetic neuropathy, unspecified: Secondary | ICD-10-CM | POA: Diagnosis not present

## 2017-06-26 DIAGNOSIS — I10 Essential (primary) hypertension: Secondary | ICD-10-CM | POA: Diagnosis not present

## 2017-06-26 DIAGNOSIS — R413 Other amnesia: Secondary | ICD-10-CM

## 2017-06-26 DIAGNOSIS — N179 Acute kidney failure, unspecified: Secondary | ICD-10-CM

## 2017-06-26 MED ORDER — DONEPEZIL HCL 5 MG PO TABS: 5.0000 mg | ORAL_TABLET | Freq: Every day | ORAL | 3 refills | Status: DC

## 2017-06-26 NOTE — Patient Instructions (Signed)
Please continue all other medications as before, and refills have been done if requested.  Please have the pharmacy call with any other refills you may need.  Please keep your appointments with your specialists as you may have planned  Please go to the LAB in the Basement (turn left off the elevator) for the tests to be done at your convenience  You will be contacted by phone if any changes need to be made immediately.  Otherwise, you will receive a letter about your results with an explanation, but please check with MyChart first.  Please remember to sign up for MyChart if you have not done so, as this will be important to you in the future with finding out test results, communicating by private email, and scheduling acute appointments online when needed.  Please return in 3 months, or sooner if needed

## 2017-06-26 NOTE — Progress Notes (Signed)
Subjective:    Patient ID: [name], male    DOB: 07-01-1945, 72 y.o.   MRN: [id number]  HPI    Here to f/u recent low volume and AKI requiring inpatient IVFs; overall doing ok,  Pt denies chest pain, increasing sob or doe, wheezing, orthopnea, PND, increased LE swelling, palpitations, dizziness or syncope.  Pt denies new neurological symptoms such as new headache, or facial or extremity weakness or numbness.  Pt denies polydipsia, polyuria, or low sugar episode.  Pt states overall good compliance with meds, mostly trying to follow appropriate diet, with wt overall stable,  but little exercise however.  No overt bleeding.   Wt overall stable. Wt Readings from Last 3 Encounters:  06/26/17 205 lb (93 kg)  06/19/17 206 lb 8 oz (93.7 kg)  06/14/17 209 lb (94.8 kg)  only walking with cane in case needed, and no falls, though c/o general weakness mild.  Had a friend drive him here due to fatigue.  No other new complaints or interval hx except becoming aware and more concerned about his worsening recent memory dysfxn Past Medical History:  Diagnosis Date  . Aortic root dilatation ([place]) 02/02/2011  . Arthritis    "lower back; going back down both my sciatic nerves"  . 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 BENIGN PROSTATIC HYPERTROPHY 08/29/2006   takes Flomax daily  . 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 Cataracts, bilateral    immature  . CHF (congestive heart failure) (HCC)    takes Lasix daily  . Chronic back pain    HNP   . CORONARY ARTERY DISEASE 08/29/2006   takes Coumadin daily  . DEPRESSION 08/29/2006  . DIABETES MELLITUS, TYPE II 08/29/2006   takes Metformin,Januvia,and Glipizide  daily  . DIASTOLIC HEART FAILURE, CHRONIC 06/09/2008  . GOUT 04/22/2007   takes Allopurinol daily  . History of migraine    23yr ago  . HYPERLIPIDEMIA 08/29/2006   takes Atorvastatin daily  . HYPERTENSION 08/29/2006   takes Lisinopril daily  . INSOMNIA-SLEEP DISORDER-UNSPEC  10/23/2007  . Left lumbar radiculopathy 05/30/2010  . LUMBAR RADICULOPATHY, RIGHT 06/10/2007  . Muscle spasm    takes Zanaflex daily  . [place] infarction [place] 12/[place]   "I've had several MIs"  . 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 PACEMAKER, PERMANENT 03/04/2008   pt denies this date  . Peripheral neuropathy    takes Gabapentin daily  . Presence of permanent cardiac pacemaker   . Shortness of breath dyspnea    "all my life" with exertion  . Sleep apnea    "if I lay flat I quit breathing; HOB up I'm fine"   Past Surgical History:  Procedure Laterality Date  . COLONOSCOPY    . CORONARY ANGIOPLASTY WITH STENT PLACEMENT  12/27/10   "I've had a total of 9 cardiac stents put in"  . CORONARY ARTERY BYPASS GRAFT  1992   CABG X 2  . ESOPHAGOGASTRODUODENOSCOPY N/A 12/01/2013   Procedure: ESOPHAGOGASTRODUODENOSCOPY (EGD);  Surgeon: DLafayette Dragon MD;  [facility];  Service: Endoscopy;  Laterality: N/A;  . INSERT / REPLACE / REMOVE PACEMAKER  ~ 2004   initial pacemaker placement  . INSERT / REPLACE / REMOVE PACEMAKER  10/2009   generator change  . LUMBAR LAMINECTOMY/DECOMPRESSION MICRODISCECTOMY Right 03/23/2014   Procedure: LAMINECTOMY AND FORAMINOTOMY RIGHT LUMBAR THREE-FOUR,LUMBAR FOUR-FIVE, LUMBAR FIVE-SACRAL ONE;  Surgeon: HCharlie Pitter MD;  [facility];  Service: Neurosurgery;  Laterality: Right;  right  . LUMBAR  LAMINECTOMY/DECOMPRESSION MICRODISCECTOMY Left 12/01/2014   Procedure: Left Lumbar Three-Four, Lumbar Four-Five Laminectomy and Foraminotomy;  Surgeon: Earnie Larsson, MD;  Location: [facility];  Service: Neurosurgery;  Laterality: Left;  . s/p left arm surgury after work accident  1991   "2000# steel fell on it"  . s/p right hand surgury for foreign object  1970's   "piece of wood went in my hand; had to get that out"    reports that he quit smoking about 44 years ago. His smoking use included cigarettes. He has a 27.00 pack-year smoking history.  He has quit using smokeless tobacco. He reports that he does not drink alcohol or use drugs. family history includes Coronary artery disease (age of onset: 13) in his other; Diabetes in his mother, other, and sister; Heart disease in his sister and sister; Lung cancer in his sister. Allergies  Allergen Reactions  . Crestor [Rosuvastatin Calcium] Other (See Comments)    Urination of blood  . Latex Rash and Other (See Comments)    Gloves cause welts on hands!!   Current Outpatient Medications on File Prior to Visit  Medication Sig Dispense Refill  . albuterol (PROVENTIL HFA;VENTOLIN HFA) 108 (90 Base) MCG/ACT inhaler Inhale 2 puffs into the lungs every 6 (six) hours as needed for wheezing or shortness of breath. 1 Inhaler 2  . allopurinol (ZYLOPRIM) 300 MG tablet take 1 tablet by mouth once daily 90 tablet 2  . atorvastatin (LIPITOR) 80 MG tablet Take 1 tablet (80 mg total) by mouth daily. 540 tablet 3  . Blood Glucose Monitoring Suppl (ONE TOUCH ULTRA 2) w/Device KIT Use as directed 1 each 3  . carvedilol (COREG) 25 MG tablet Take 1 tablet (25 mg total) by mouth 2 (two) times daily. 180 tablet 2  . Cholecalciferol (VITAMIN D) 1000 UNITS capsule Take 1,000 Units by mouth 2 (two) times daily. Reported on 02/15/2015    . fluticasone (FLONASE) 50 MCG/ACT nasal spray USE 2 SPRAYS IN EACH  NOSTRIL EVERY DAY 48 g 0  . furosemide (LASIX) 40 MG tablet Take 2 tablets (80 mg total) by mouth 2 (two) times daily. 540 tablet 2  . gabapentin (NEURONTIN) 300 MG capsule TAKE 2 CAPSULES BY MOUTH 3  TIMES DAILY (Patient taking differently: TAKE 1 CAPSULE BY MOUTH 3  TIMES DAILY) 540 capsule 3  . glipiZIDE (GLUCOTROL XL) 10 MG 24 hr tablet Take 1 tablet (10 mg total) by mouth daily with breakfast. 90 tablet 2  . glucose blood (ONE TOUCH ULTRA TEST) test strip CHECK BLOOD SUGAR TWO TIMES DAILY 100 each 3  . HYDROcodone-acetaminophen (NORCO) 10-325 MG tablet Take 1 tablet by mouth daily as needed. (Patient taking  differently: Take 1 tablet by mouth daily as needed for moderate pain. ) 30 tablet 0  . lisinopril (PRINIVIL,ZESTRIL) 2.5 MG tablet Take 1 tablet (2.5 mg total) by mouth daily. 90 tablet 2  . metFORMIN (GLUCOPHAGE) 500 MG tablet TAKE 2 TABLETS BY MOUTH TWO TIMES DAILY 360 tablet 2  . Multiple Vitamin (MULTIVITAMIN WITH MINERALS) TABS tablet Take 1 tablet by mouth every morning.    Glory Rosebush DELICA LANCETS 32X MISC USE AS DIRECTED THREE TIMES DAILY TO CHECK BLOOD SUGAR 300 each 0  . oxybutynin (DITROPAN XL) 10 MG 24 hr tablet Take 1 tablet (10 mg total) by mouth at bedtime. 90 tablet 3  . potassium chloride SA (K-DUR,KLOR-CON) 20 MEQ tablet Take 1 tablet (20 mEq total) by mouth every evening. 90 tablet 2  . sitaGLIPtin (  JANUVIA) 100 MG tablet Take 1 tablet (100 mg total) by mouth daily. 90 tablet 3  . tamsulosin (FLOMAX) 0.4 MG CAPS capsule TAKE 1 CAPSULE BY MOUTH  EVERY 12 HOURS 180 capsule 2  . warfarin (COUMADIN) 5 MG tablet Take 1/2 tablet daily except 1 tablet on Mondays, Wednesdays and Fridays (Patient taking differently: Take 2.5-5 mg by mouth See admin instructions. Take 1/2 tablet daily except 1 tablet on Mondays, Wednesdays and Fridays) 90 tablet 3   No current facility-administered medications on file prior to visit.    Review of Systems  Constitutional: Negative for other unusual diaphoresis or sweats HENT: Negative for ear discharge or swelling Eyes: Negative for other worsening visual disturbances Respiratory: Negative for stridor or other swelling  Gastrointestinal: Negative for worsening distension or other blood Genitourinary: Negative for retention or other urinary change Musculoskeletal: Negative for other MSK pain or swelling Skin: Negative for color change or other new lesions Neurological: Negative for worsening tremors and other numbness  Psychiatric/Behavioral: Negative for worsening agitation or other fatigue All other system neg per pt    Objective:   Physical  Exam BP 102/68   Pulse 91   Temp 97.8 F (36.6 C) (Oral)   Ht '5\' 11"'  (1.803 m)   Wt 205 lb (93 kg)   SpO2 92%   BMI 28.59 kg/m  VS noted,  Constitutional: Pt appears in NAD HENT: Head: NCAT.  Right Ear: External ear normal.  Left Ear: External ear normal.  Eyes: . Pupils are equal, round, and reactive to light. Conjunctivae and EOM are normal Nose: without d/c or deformity Neck: Neck supple. Gross normal ROM Cardiovascular: Normal rate and regular rhythm.   Pulmonary/Chest: Effort normal and breath sounds without rales or wheezing.  Abd:  Soft, NT, ND, + BS, no organomegaly Neurological: Pt is alert. At baseline orientation, motor grossly intact Skin: Skin is warm. No rashes, other new lesions, no LE edema Psychiatric: Pt behavior is normal without agitation  No other exam findings Lab Results  Component Value Date   WBC 5.2 06/17/2017   HGB 9.3 (L) 06/17/2017   HCT 30.9 (L) 06/17/2017   PLT 124 (L) 06/17/2017   GLUCOSE 183 (H) 06/28/2017   CHOL 94 06/15/2017   TRIG 80.0 06/15/2017   HDL 29.10 (L) 06/15/2017   LDLCALC 49 06/15/2017   ALT 56 06/16/2017   AST 57 (H) 06/16/2017   NA 139 06/28/2017   K 4.5 06/28/2017   CL 96 06/28/2017   CREATININE 1.20 06/28/2017   BUN 14 06/28/2017   CO2 27 06/28/2017   TSH 1.11 06/15/2017   PSA 4.81 (H) 06/15/2017   INR 3.4 (A) 06/28/2017   HGBA1C 8.0 (H) 06/15/2017   MICROALBUR 2.8 (H) 06/15/2017        Assessment & Plan:

## 2017-06-26 NOTE — Progress Notes (Signed)
HPI: FU coronary artery disease status post coronary artery bypass graft, atrial flutter and previous pacemaker placement. Last cardiac catheterization in May of 2010 showed a normal left main, a 50-70% circumflex, a totally occluded LAD, and a focal 70% right coronary artery. The saphenous vein graft to the circumflex was occluded. The LIMA to the LAD was patent. Ejection fraction was 60%. Carotid Dopplers in October of 2011 were normal. At previous OV pt noted to be in atrial flutter; seen by GT and rate control and anticoagulation recommended. Nuclear study 2/16 showed EF 52, fixed apical defect; no ischemia.Abdominal CT 12/18 showed no aneurysm; 4 mm nodule and fu could be considered 12 months if pt high risk.   Last echocardiogram every 2019 showed normal LV systolic function, mildly dilated aortic root, moderate left atrial enlargement, mild right ventricular enlargement right atrial enlargement.  Patient admitted May 2019 with acute renal failure with creatinine 4.33.  His Lasix was held with improvement and he was discharged on lower dose.  Since last seen,he has some dyspnea on exertion but no orthopnea, PND or pedal edema.  He states after breakfast he develops chest heaviness that goes away with resting.  He has no chest pain the rest of the day and denies exertional chest pain.  No syncope.  Current Outpatient Medications  Medication Sig Dispense Refill  . albuterol (PROVENTIL HFA;VENTOLIN HFA) 108 (90 Base) MCG/ACT inhaler Inhale 2 puffs into the lungs every 6 (six) hours as needed for wheezing or shortness of breath. 1 Inhaler 2  . allopurinol (ZYLOPRIM) 300 MG tablet take 1 tablet by mouth once daily 90 tablet 2  . atorvastatin (LIPITOR) 80 MG tablet Take 1 tablet (80 mg total) by mouth daily. 540 tablet 3  . Blood Glucose Monitoring Suppl (ONE TOUCH ULTRA 2) w/Device KIT Use as directed 1 each 3  . carvedilol (COREG) 25 MG tablet Take 1 tablet (25 mg total) by mouth 2 (two) times  daily. 180 tablet 2  . Cholecalciferol (VITAMIN D) 1000 UNITS capsule Take 1,000 Units by mouth 2 (two) times daily. Reported on 02/15/2015    . donepezil (ARICEPT) 5 MG tablet Take 1 tablet (5 mg total) by mouth daily. 90 tablet 3  . fluticasone (FLONASE) 50 MCG/ACT nasal spray USE 2 SPRAYS IN EACH  NOSTRIL EVERY DAY 48 g 0  . furosemide (LASIX) 40 MG tablet Take 2 tablets (80 mg total) by mouth 2 (two) times daily. 540 tablet 2  . gabapentin (NEURONTIN) 300 MG capsule TAKE 2 CAPSULES BY MOUTH 3  TIMES DAILY (Patient taking differently: TAKE 1 CAPSULE BY MOUTH 3  TIMES DAILY) 540 capsule 3  . glipiZIDE (GLUCOTROL XL) 10 MG 24 hr tablet Take 1 tablet (10 mg total) by mouth daily with breakfast. 90 tablet 2  . glucose blood (ONE TOUCH ULTRA TEST) test strip CHECK BLOOD SUGAR TWO TIMES DAILY 100 each 3  . HYDROcodone-acetaminophen (NORCO) 10-325 MG tablet Take 1 tablet by mouth daily as needed. (Patient taking differently: Take 1 tablet by mouth daily as needed for moderate pain. ) 30 tablet 0  . lisinopril (PRINIVIL,ZESTRIL) 2.5 MG tablet Take 1 tablet (2.5 mg total) by mouth daily. 90 tablet 2  . metFORMIN (GLUCOPHAGE) 500 MG tablet TAKE 2 TABLETS BY MOUTH TWO TIMES DAILY 360 tablet 2  . Multiple Vitamin (MULTIVITAMIN WITH MINERALS) TABS tablet Take 1 tablet by mouth every morning.    Glory Rosebush DELICA LANCETS 33I MISC USE AS DIRECTED THREE TIMES  DAILY TO CHECK BLOOD SUGAR 300 each 0  . oxybutynin (DITROPAN XL) 10 MG 24 hr tablet Take 1 tablet (10 mg total) by mouth at bedtime. 90 tablet 3  . potassium chloride SA (K-DUR,KLOR-CON) 20 MEQ tablet Take 1 tablet (20 mEq total) by mouth every evening. 90 tablet 2  . sitaGLIPtin (JANUVIA) 100 MG tablet Take 1 tablet (100 mg total) by mouth daily. 90 tablet 3  . tamsulosin (FLOMAX) 0.4 MG CAPS capsule TAKE 1 CAPSULE BY MOUTH  EVERY 12 HOURS 180 capsule 2  . warfarin (COUMADIN) 5 MG tablet Take 1/2 tablet daily except 1 tablet on Mondays, Wednesdays and  Fridays (Patient taking differently: Take 2.5-5 mg by mouth See admin instructions. Take 1/2 tablet daily except 1 tablet on Mondays, Wednesdays and Fridays) 90 tablet 3   No current facility-administered medications for this visit.      Past Medical History:  Diagnosis Date  . Aortic root dilatation ([place]) 02/02/2011  . Arthritis    "lower back; going back down both my sciatic nerves"  . 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 BENIGN PROSTATIC HYPERTROPHY 08/29/2006   takes Flomax daily  . 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 Cataracts, bilateral    immature  . CHF (congestive heart failure) ([facility])    takes Lasix daily  . Chronic back pain    HNP   . CORONARY ARTERY DISEASE 08/29/2006   takes Coumadin daily  . DEPRESSION 08/29/2006  . DIABETES MELLITUS, TYPE II 08/29/2006   takes Metformin,Januvia,and Glipizide  daily  . DIASTOLIC HEART FAILURE, CHRONIC 06/09/2008  . GOUT 04/22/2007   takes Allopurinol daily  . History of migraine    28yr ago  . HYPERLIPIDEMIA 08/29/2006   takes Atorvastatin daily  . HYPERTENSION 08/29/2006   takes Lisinopril daily  . INSOMNIA-SLEEP DISORDER-UNSPEC 10/23/2007  . Left lumbar radiculopathy 05/30/2010  . LUMBAR RADICULOPATHY, RIGHT 06/10/2007  . Muscle spasm    takes Zanaflex daily  . [place] ([place] 12/27/10   "I've had several MIs"  . 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 PACEMAKER, PERMANENT 03/04/2008   pt denies this date  . Peripheral neuropathy    takes Gabapentin daily  . Presence of permanent cardiac pacemaker   . Shortness of breath dyspnea    "all my life" with exertion  . Sleep apnea    "if I lay flat I quit breathing; HOB up I'm fine"    Past Surgical History:  Procedure Laterality Date  . COLONOSCOPY    . CORONARY ANGIOPLASTY WITH STENT PLACEMENT  12/27/10   "I've had a total of 9 cardiac stents put in"  . CORONARY ARTERY BYPASS GRAFT  1992   CABG X 2  . ESOPHAGOGASTRODUODENOSCOPY N/A 12/01/2013    Procedure: ESOPHAGOGASTRODUODENOSCOPY (EGD);  Surgeon: DLafayette Dragon MD;  [facility];  Service: Endoscopy;  Laterality: N/A;  . INSERT / REPLACE / REMOVE PACEMAKER  ~ 2004   initial pacemaker placement  . INSERT / REPLACE / REMOVE PACEMAKER  10/2009   generator change  . LUMBAR LAMINECTOMY/DECOMPRESSION MICRODISCECTOMY Right 03/23/2014   Procedure: LAMINECTOMY AND FORAMINOTOMY RIGHT LUMBAR THREE-FOUR,LUMBAR FOUR-FIVE, LUMBAR FIVE-SACRAL ONE;  Surgeon: HCharlie Pitter MD;  [facility];  Service: Neurosurgery;  Laterality: Right;  right  . LUMBAR LAMINECTOMY/DECOMPRESSION MICRODISCECTOMY Left 12/01/2014   Procedure: Left Lumbar Three-Four, Lumbar Four-Five Laminectomy and Foraminotomy;  Surgeon: HEarnie Larsson MD;  [facility];  Service: Neurosurgery;  Laterality: Left;  . s/p left arm surgury  after work accident  1991   "2000# steel fell on it"  . s/p right hand surgury for foreign object  1970's   "piece of wood went in my hand; had to get that out"    Social History   Socioeconomic History  . Marital status: Married    Spouse name: Not on file  . Number of children: 2  . Years of education: Not on file  . Highest education level: Not on file  Occupational History  . Occupation: prior work Designer, industrial/product: UNEMPLOYED  . Occupation: disabled since 2004  Social Needs  . Financial resource strain: Not on file  . Food insecurity:    Worry: Not on file    Inability: Not on file  . Transportation needs:    Medical: Not on file    Non-medical: Not on file  Tobacco Use  . Smoking status: Former Smoker    Packs/day: 3.00    Years: 9.00    Pack years: 27.00    Types: Cigarettes    Last attempt to quit: 02/26/1973    Years since quitting: 44.3  . Smokeless tobacco: Former Systems developer  . Tobacco comment: quit smoking 62yr ago  Substance and Sexual Activity  . Alcohol use: No    Alcohol/week: 0.0 oz  . Drug use: No    Comment: "used pouches of  tobacco for a long time; quit those 12/09/1970"  . Sexual activity: Yes  Lifestyle  . Physical activity:    Days per week: Not on file    Minutes per session: Not on file  . Stress: Not on file  Relationships  . Social connections:    Talks on phone: Not on file    Gets together: Not on file    Attends religious service: Not on file    Active member of club or organization: Not on file    Attends meetings of clubs or organizations: Not on file    Relationship status: Not on file  . Intimate partner violence:    Fear of current or ex partner: Not on file    Emotionally abused: Not on file    Physically abused: Not on file    Forced sexual activity: Not on file  Other Topics Concern  . Not on file  Social History Narrative  . Not on file    Family History  Problem Relation Age of Onset  . Diabetes Mother   . Diabetes Sister   . Heart disease Sister        2 sister died with heart disease  . Coronary artery disease Other 548      male, first degree relative  . Diabetes Other        1st degree relative  . Heart disease Sister   . Lung cancer Sister        deceased    ROS: Fatigue but no fevers or chills, productive cough, hemoptysis, dysphasia, odynophagia, melena, hematochezia, dysuria, hematuria, rash, seizure activity, orthopnea, PND, pedal edema, claudication. Remaining systems are negative.  Physical Exam: Well-developed well-nourished in no acute distress.  Skin is warm and dry.  HEENT is normal.  Neck is supple.  Chest is clear to auscultation with normal expansion.  Cardiovascular exam is regular rate and rhythm.  Abdominal exam nontender or distended. No masses palpated. Extremities show no edema. neuro grossly intact   A/P  1 atrial flutter-plan to continue beta-blockade for rate control.  Continue Coumadin.  Ablation was not  recommended previously.  He is not on a DOAC due to expense.  2 coronary artery disease-Plan to continue medical therapy.   Continue statin.  He is not on aspirin given need for Coumadin.  He is describing vague chest discomfort after breakfast but no exertional chest pain.  Plan Lexiscan nuclear study for risk stratification.  3 chronic diastolic congestive heart failure-patient appears to be euvolemic on examination today.  We will continue present dose of Lasix (reduced recently due to acute renal insuff).  I discussed the importance of fluid restriction and low-sodium diet.  Check potassium and renal function.  4 hypertension-blood pressure is controlled.  Continue present medications.  5 hyperlipidemia-continue statin.  6 pacemaker-followed by electrophysiology.  7 lung nodule-followed by primary care.  8 recent acute renal failure-diuretics were adjusted.  Recheck potassium and renal function.  [name], MD

## 2017-06-28 ENCOUNTER — Ambulatory Visit (INDEPENDENT_AMBULATORY_CARE_PROVIDER_SITE_OTHER): Payer: Medicare Other | Admitting: Pharmacist Clinician (PhC)/ Clinical Pharmacy Specialist

## 2017-06-28 ENCOUNTER — Encounter: Payer: Self-pay | Admitting: *Deleted

## 2017-06-28 ENCOUNTER — Encounter: Payer: Self-pay | Admitting: Cardiology

## 2017-06-28 ENCOUNTER — Ambulatory Visit: Payer: Medicare Other | Admitting: Cardiology

## 2017-06-28 VITALS — BP 110/50 | HR 60 | Ht 71.0 in | Wt 207.4 lb

## 2017-06-28 DIAGNOSIS — D649 Anemia, unspecified: Secondary | ICD-10-CM

## 2017-06-28 DIAGNOSIS — I10 Essential (primary) hypertension: Secondary | ICD-10-CM | POA: Diagnosis not present

## 2017-06-28 DIAGNOSIS — Z7901 Long term (current) use of anticoagulants: Secondary | ICD-10-CM | POA: Diagnosis not present

## 2017-06-28 DIAGNOSIS — R413 Other amnesia: Secondary | ICD-10-CM | POA: Diagnosis not present

## 2017-06-28 DIAGNOSIS — N179 Acute kidney failure, unspecified: Secondary | ICD-10-CM | POA: Diagnosis not present

## 2017-06-28 DIAGNOSIS — I4891 Unspecified atrial fibrillation: Secondary | ICD-10-CM

## 2017-06-28 DIAGNOSIS — E7800 Pure hypercholesterolemia, unspecified: Secondary | ICD-10-CM | POA: Diagnosis not present

## 2017-06-28 DIAGNOSIS — I483 Typical atrial flutter: Secondary | ICD-10-CM | POA: Diagnosis not present

## 2017-06-28 DIAGNOSIS — I5032 Chronic diastolic (congestive) heart failure: Secondary | ICD-10-CM | POA: Diagnosis not present

## 2017-06-28 DIAGNOSIS — R072 Precordial pain: Secondary | ICD-10-CM | POA: Diagnosis not present

## 2017-06-28 LAB — BASIC METABOLIC PANEL
BUN/Creatinine Ratio: 12 (ref 10–24)
BUN: 14 mg/dL (ref 8–27)
CALCIUM: 9 mg/dL (ref 8.6–10.2)
CHLORIDE: 96 mmol/L (ref 96–106)
CO2: 27 mmol/L (ref 20–29)
Creatinine, Ser: 1.2 mg/dL (ref 0.76–1.27)
GFR, EST AFRICAN AMERICAN: 70 mL/min/{1.73_m2} (ref >59)
GFR, EST NON AFRICAN AMERICAN: 60 mL/min/{1.73_m2} (ref >59)
Glucose: 183 mg/dL — ABNORMAL HIGH (ref 65–99)
Potassium: 4.5 mmol/L (ref 3.5–5.2)
Sodium: 139 mmol/L (ref 134–144)

## 2017-06-28 LAB — VITAMIN B12: Vitamin B-12: 1360 pg/mL — ABNORMAL HIGH (ref 232–1245)

## 2017-06-28 LAB — POCT INR: INR: 3.4 — AB (ref 2.0–3.0)

## 2017-06-28 NOTE — Progress Notes (Signed)
This encounter was created in error - please disregard.

## 2017-06-28 NOTE — Patient Instructions (Signed)
Medication Instructions:   NO CHANGE  Labwork:  Your physician recommends that you HAVE LAB WORK TODAY  Testing/Procedures:  Your physician has requested that you have a lexiscan myoview. For further information please visit [email or web address]. Please follow instruction sheet, as given.    Follow-Up:  Your physician wants you to follow-up in: 6 MONTHS WITH DR CRENSHAW You will receive a reminder letter in the mail two months in advance. If you don't receive a letter, please call our office to schedule the follow-up appointment.   If you need a refill on your cardiac medications before your next appointment, please call your pharmacy.    

## 2017-06-29 ENCOUNTER — Other Ambulatory Visit: Payer: Self-pay

## 2017-06-29 ENCOUNTER — Telehealth: Payer: Self-pay | Admitting: Cardiology

## 2017-06-29 ENCOUNTER — Other Ambulatory Visit: Payer: Self-pay | Admitting: *Deleted

## 2017-06-29 NOTE — Telephone Encounter (Signed)
RESULTS GIVEN TO PATIENT ,VERBALIZED UNDERSTANDING

## 2017-06-29 NOTE — Telephone Encounter (Signed)
Spoke with pt, aware we were not able to complete the IBC panel. Patient aware to go to dr Gwynn Burly office to have labs.

## 2017-06-29 NOTE — Patient Outreach (Signed)
[facility] ([facility]) Care Management  06/29/2017  [name] 05/01/1945 [id number]  [name] is a 72 y.o.malewith medical history which includes chronic diastolic heart failure, CAD, HTN, HLD, and type II DM. [name] was admiited to the hospital on 06/15/17 for treatment of acute kidney injury, hyperkalemia, and hyponatremia. [name] was discharged from the hospital on 06/19/17 and referred to [facility] Care Management for transition of care services. He was active with our health coach but will now be followed weekly for transition of care services.   Transition of Care Outreach #2 today.   Provider Follow up - [name] saw his primary care provider, Dr. James John on 06/26/17 and his cardiologist, Dr. Brian Crenshaw on 06/28/17.   Cardiovascular Disease - [name] expresses that he has post prandial chest discomfort which he shared with his cardiologist. He denies other cardiovascular symptom. When he saw Dr. Crenshaw, no change was made in his plan of care but plans for Lexiscan were arranged and labs were ordered to follow up on his renal function. Blood pressure remains stable. [name] has a blood pressure monitor at home and monitors regularly. [name] has digital scales and weighs himself daily and records. I advised that he call his provider for weight gain of 3lb overnight or 5 pounds in a week or if he experiences swelling, shortness of breath, or other new or worsened CV symptom.   Diabetes Management - [name]'s latest HgA1C = 8.0. He admittedly has struggled with adherence to his prescribed carb modified diet and has seen great variation in his blood glucose stability as a result. He has excellent knowledge of his prescribed carb modified diet and says he is always working on adherence.     Acute Kidney Injury - as noted, labs were ordered by Dr. Crenshaw to follow up on [name]'s renal function  06/28/17: BUN/Creat 14/1.2 K+  4.4 06/17/17: BUN/Creat 37/2.24 K+ 5.2  [facility] CM Care Plan Problem One     Most Recent Value  Care Plan Problem One  knowledge deficit related to self management of diabetes and heart failure  Role Documenting the Problem One  Care Management Telephonic Coordinator  Care Plan for Problem One  Active  [facility] Long Term Goal   Patient will continue to keep blood sugars less than 150 within the next 90 days.   [facility] Long Term Goal Start Date  05/17/17  Interventions for Problem One Long Term Goal  reviewed medications and importance of ongoing cbg monitoring  [facility] CM Short Term Goal #1   Patient will weigh daily in the next 30 days  [facility] CM Short Term Goal #1 Start Date  06/21/17  Interventions for Short Term Goal #1  reviewed importance of daily weights and recording  [facility] CM Short Term Goal #2   In next 30 days the patient will verbalize following the instructions of his physician regarding the use of his arm and hand  [facility] CM Short Term Goal #2 Start Date  06/21/17  Interventions for Short Term Goal #2  interviewed patient re: management of arm/hand injury    [facility] CM Care Plan Problem Two     Most Recent Value  Care Plan Problem Two  Knowledge Deficits related to understanding of plan of care for long term management of AKI/CKD  Role Documenting the Problem Two  Health Coach  Care Plan for Problem Two  Active  Interventions for Problem Two Long Term Goal     reviewed plan of care as established by PCP and cardiology  [facility] Long Term Goal  Over the next 31 days, patient will verbalize understanding of plan of care for long term management of CKD  [facility] Long Term Goal Start Date  06/21/17  [facility] CM Short Term Goal #1   Over the next 14 days, patient will attend scheudled provider appointment and will voice questions about management of kidney disease  [facility] CM Short Term Goal #1 Start Date  06/21/17  [facility] CM Short Term Goal #1 Met Date   06/29/17        MHA,BSN,RN,CCM [facility] Care Management  [phone]  [phone]    

## 2017-06-29 NOTE — Telephone Encounter (Signed)
New message     Patient is calling to get test results from [facility]

## 2017-06-29 NOTE — Patient Outreach (Signed)
[facility]) Care Management  06/29/2017  [name] 1945-07-26 [id number]   Per chart review the patient was discharged from the hospital on 06/19/2017 from Acute Renal Failure. The patient will be followed by Janalyn Shy RN.  Plan: RN Health Coach will close the case for [place] management.  [name] RN, BSN, [place] Direct Dial:  [phone]  Fax: [phone]

## 2017-07-01 NOTE — Assessment & Plan Note (Signed)
stable overall by history and exam, recent data reviewed with pt, and pt to continue medical treatment as before,  to f/u any worsening symptoms or concerns BP Readings from Last 3 Encounters:  06/28/17 (!) 110/50  06/26/17 102/68  06/19/17 (!) 145/93

## 2017-07-01 NOTE — Assessment & Plan Note (Signed)
stable overall by history and exam, recent data reviewed with pt, and pt to continue medical treatment as before,  to f/u any worsening symptoms or concerns  

## 2017-07-01 NOTE — Assessment & Plan Note (Signed)
Improved, cont same tx 

## 2017-07-01 NOTE — Assessment & Plan Note (Signed)
C/w mild cognitive impairment, for aricept 5 qd

## 2017-07-06 ENCOUNTER — Other Ambulatory Visit: Payer: Self-pay | Admitting: *Deleted

## 2017-07-06 NOTE — Patient Outreach (Signed)
[facility] [facility]) Care Management  07/06/2017  [name] 1945/02/10 [id number]  [name] is a89 y.o.malewith medical historywhich includeschronic diastolic heart failure, CAD, HTN, HLD, and type II DM.[name] was admiited to the hospital on 06/15/17 for treatment ofacute kidney injury, hyperkalemia, andhyponatremia. [name] was discharged from the hospital on 06/19/17 and referred to [place] Management for transition of care services. He was active with our health coach but will now be followed weekly for transition of care services.  Unsuccessful transition of care outreach attempt #3 today.   Plan: I will transition [name] to my colleague for ongoing transition of care assessments and assistance.    [place] Management  [phone]

## 2017-07-10 ENCOUNTER — Telehealth (HOSPITAL_COMMUNITY): Payer: Self-pay

## 2017-07-10 NOTE — Telephone Encounter (Signed)
Encounter complete. 

## 2017-07-11 ENCOUNTER — Ambulatory Visit (INDEPENDENT_AMBULATORY_CARE_PROVIDER_SITE_OTHER): Payer: Medicare Other | Admitting: Pharmacist

## 2017-07-11 ENCOUNTER — Telehealth (HOSPITAL_COMMUNITY): Payer: Self-pay

## 2017-07-11 DIAGNOSIS — I483 Typical atrial flutter: Secondary | ICD-10-CM

## 2017-07-11 DIAGNOSIS — Z7901 Long term (current) use of anticoagulants: Secondary | ICD-10-CM | POA: Diagnosis not present

## 2017-07-11 DIAGNOSIS — I4891 Unspecified atrial fibrillation: Secondary | ICD-10-CM

## 2017-07-11 LAB — POCT INR: INR: 2.6 (ref 2.0–3.0)

## 2017-07-11 NOTE — Telephone Encounter (Signed)
Encounter complete. 

## 2017-07-12 ENCOUNTER — Ambulatory Visit (HOSPITAL_COMMUNITY)
Admission: RE | Admit: 2017-07-12 | Discharge: 2017-07-12 | Disposition: A | Discharge status: Home / Self Care | Payer: Medicare Other | Source: Ambulatory Visit | Attending: Cardiology | Admitting: Cardiology

## 2017-07-12 DIAGNOSIS — R072 Precordial pain: Secondary | ICD-10-CM | POA: Insufficient documentation

## 2017-07-12 LAB — MYOCARDIAL PERFUSION IMAGING
CHL CUP NUCLEAR SSS: 10
CHL CUP RESTING HR STRESS: 61 {beats}/min
LVDIAVOL: 153 mL (ref 62–150)
LVSYSVOL: 79 mL
NUC STRESS TID: 1.03
Peak HR: 63 {beats}/min
SDS: 2
SRS: 8

## 2017-07-12 MED ORDER — TECHNETIUM TC 99M TETROFOSMIN IV KIT
10.4000 | PACK | Freq: Once | INTRAVENOUS | Status: AC | PRN
  Administered 2017-07-12: 10.4 via INTRAVENOUS
  Filled 2017-07-12: qty 11

## 2017-07-12 MED ORDER — REGADENOSON 0.4 MG/5ML IV SOLN
0.4000 mg | Freq: Once | INTRAVENOUS | Status: AC
  Administered 2017-07-12: 0.4 mg via INTRAVENOUS

## 2017-07-12 MED ORDER — TECHNETIUM TC 99M TETROFOSMIN IV KIT
31.8000 | PACK | Freq: Once | INTRAVENOUS | Status: AC | PRN
  Administered 2017-07-12: 31.8 via INTRAVENOUS
  Filled 2017-07-12: qty 32

## 2017-07-25 ENCOUNTER — Encounter: Payer: Self-pay | Admitting: *Deleted

## 2017-07-25 ENCOUNTER — Other Ambulatory Visit: Payer: Self-pay | Admitting: *Deleted

## 2017-07-25 NOTE — Patient Outreach (Signed)
[name]) Care Management  07/25/2017  [name] February 10, 1945 [id number]  Transition of Care call #4  Telephone call attempt x2; spouse answered call and advised that patient was not available to take call; advised she would take number & give patient message to return call.   Plan: Geophysicist/field seismologist. Will follow up 2-4 business days.  Sherrin Daisy, RN BSN [place] Management Coordinator [facility] Care Management  [phone]

## 2017-07-31 ENCOUNTER — Ambulatory Visit (INDEPENDENT_AMBULATORY_CARE_PROVIDER_SITE_OTHER): Payer: Medicare Other | Admitting: Orthopaedic Surgery

## 2017-07-31 ENCOUNTER — Encounter (INDEPENDENT_AMBULATORY_CARE_PROVIDER_SITE_OTHER): Payer: Self-pay | Admitting: Orthopaedic Surgery

## 2017-07-31 ENCOUNTER — Other Ambulatory Visit: Payer: Self-pay | Admitting: *Deleted

## 2017-07-31 VITALS — BP 128/75 | HR 69 | Ht 71.0 in | Wt 210.0 lb

## 2017-07-31 DIAGNOSIS — M542 Cervicalgia: Secondary | ICD-10-CM | POA: Diagnosis not present

## 2017-07-31 NOTE — Patient Outreach (Signed)
[place] [facility]) Care Management  07/31/2017  [name] [birth year] [id number]  Telephone call to patient/last Transition of care call attempt.  Telephone call to patient who advised that he was feeling well. Voices that he has not been readmitted to hospital or been to emergency room since previous hospital admission.   States he has appointment with primary care provider tomorrow and will be driving himself. Voices that he is taking medications as ordered by his doctors.   Voices he is weighing daily and knows what he should do if there is overage of weight.  Patient & spouse voice that they are comfortable managing patient's medical conditions.  Patient voices that he is getting monthly calls from [facility] for diabetes & heart failure management.  Care plan goals have been met. Patient states he is ready for case closure at this time.  Plan.: Update care plan-goals met. Send MD closure letter. Case closure.

## 2017-08-01 ENCOUNTER — Ambulatory Visit: Payer: Medicare Other | Admitting: Internal Medicine

## 2017-08-07 ENCOUNTER — Encounter (INDEPENDENT_AMBULATORY_CARE_PROVIDER_SITE_OTHER): Payer: Self-pay | Admitting: Orthopaedic Surgery
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
## 2017-08-08 ENCOUNTER — Telehealth (INDEPENDENT_AMBULATORY_CARE_PROVIDER_SITE_OTHER): Payer: Self-pay | Admitting: Orthopaedic Surgery

## 2017-08-08 ENCOUNTER — Ambulatory Visit: Payer: Medicare Other | Admitting: Urology

## 2017-08-08 ENCOUNTER — Encounter: Payer: Self-pay | Admitting: *Deleted

## 2017-08-08 NOTE — Telephone Encounter (Signed)
07/31/2017 OV note & billing faxed to [facility] w/ Lilyan Punt [phone]

## 2017-08-14 ENCOUNTER — Ambulatory Visit (INDEPENDENT_AMBULATORY_CARE_PROVIDER_SITE_OTHER): Payer: Medicare Other | Admitting: Orthopaedic Surgery

## 2017-08-17 ENCOUNTER — Ambulatory Visit (INDEPENDENT_AMBULATORY_CARE_PROVIDER_SITE_OTHER): Payer: Medicare Other | Admitting: *Deleted

## 2017-08-17 DIAGNOSIS — I442 Atrioventricular block, complete: Secondary | ICD-10-CM

## 2017-08-17 LAB — CUP PACEART INCLINIC DEVICE CHECK
Battery Remaining Longevity: 54 mo
Battery Voltage: 2.77 V
Brady Statistic RV Percent Paced: 99 %
Date Time Interrogation Session: 20190719142936
Implantable Lead Location: 753860
Implantable Lead Model: 5076
Implantable Pulse Generator Implant Date: 20111004
Lead Channel Impedance Value: 484 Ohm
Lead Channel Pacing Threshold Amplitude: 0.5 V
Lead Channel Pacing Threshold Pulse Width: 0.4 ms
Lead Channel Setting Pacing Amplitude: 2.5 V
Lead Channel Setting Pacing Pulse Width: 0.4 ms
Lead Channel Setting Sensing Sensitivity: 2.8 mV
MDC IDC LEAD IMPLANT DT: 20030730
MDC IDC LEAD IMPLANT DT: 20030730
MDC IDC LEAD LOCATION: 753859
MDC IDC MSMT BATTERY IMPEDANCE: 1280 Ohm
MDC IDC MSMT LEADCHNL RA IMPEDANCE VALUE: 67 Ohm
MDC IDC MSMT LEADCHNL RV PACING THRESHOLD AMPLITUDE: 0.5 V
MDC IDC MSMT LEADCHNL RV PACING THRESHOLD PULSEWIDTH: 0.4 ms
MDC IDC MSMT LEADCHNL RV SENSING INTR AMPL: 11.2 mV

## 2017-08-17 NOTE — Progress Notes (Signed)
Pacemaker check in clinic. Normal device function. Threshold, sensing, and impedance consistent with previous measurements. Device programmed to maximize longevity. (4) high ventricular rates noted, max dur. 9sec. Device programmed at appropriate safety margins. Histogram distribution appropriate for patient activity level. Device programmed to optimize intrinsic conduction. Estimated longevity 4.5 years. Patient will follow up with [facility] in 12-2017.

## 2017-08-20 ENCOUNTER — Ambulatory Visit (INDEPENDENT_AMBULATORY_CARE_PROVIDER_SITE_OTHER): Payer: Medicare Other | Admitting: Pharmacist Clinician (PhC)/ Clinical Pharmacy Specialist

## 2017-08-20 DIAGNOSIS — I483 Typical atrial flutter: Secondary | ICD-10-CM | POA: Diagnosis not present

## 2017-08-20 DIAGNOSIS — Z7901 Long term (current) use of anticoagulants: Secondary | ICD-10-CM | POA: Diagnosis not present

## 2017-08-20 DIAGNOSIS — I4891 Unspecified atrial fibrillation: Secondary | ICD-10-CM

## 2017-08-20 LAB — POCT INR: INR: 3 (ref 2.0–3.0)

## 2017-09-06 ENCOUNTER — Encounter: Payer: Self-pay | Admitting: Internal Medicine

## 2017-09-06 ENCOUNTER — Other Ambulatory Visit (INDEPENDENT_AMBULATORY_CARE_PROVIDER_SITE_OTHER): Payer: Medicare Other

## 2017-09-06 ENCOUNTER — Ambulatory Visit (INDEPENDENT_AMBULATORY_CARE_PROVIDER_SITE_OTHER): Payer: Medicare Other | Admitting: Internal Medicine

## 2017-09-06 ENCOUNTER — Other Ambulatory Visit: Payer: Self-pay | Admitting: Internal Medicine

## 2017-09-06 VITALS — BP 114/68 | HR 81 | Temp 97.7°F | Ht 71.0 in | Wt 217.0 lb

## 2017-09-06 DIAGNOSIS — Z0000 Encounter for general adult medical examination without abnormal findings: Secondary | ICD-10-CM | POA: Diagnosis not present

## 2017-09-06 DIAGNOSIS — E1165 Type 2 diabetes mellitus with hyperglycemia: Secondary | ICD-10-CM

## 2017-09-06 DIAGNOSIS — IMO0002 Reserved for concepts with insufficient information to code with codable children: Secondary | ICD-10-CM

## 2017-09-06 DIAGNOSIS — N179 Acute kidney failure, unspecified: Secondary | ICD-10-CM

## 2017-09-06 DIAGNOSIS — R413 Other amnesia: Secondary | ICD-10-CM

## 2017-09-06 DIAGNOSIS — N182 Chronic kidney disease, stage 2 (mild): Secondary | ICD-10-CM

## 2017-09-06 DIAGNOSIS — E1140 Type 2 diabetes mellitus with diabetic neuropathy, unspecified: Secondary | ICD-10-CM

## 2017-09-06 DIAGNOSIS — D649 Anemia, unspecified: Secondary | ICD-10-CM | POA: Diagnosis not present

## 2017-09-06 DIAGNOSIS — I10 Essential (primary) hypertension: Secondary | ICD-10-CM | POA: Diagnosis not present

## 2017-09-06 DIAGNOSIS — R972 Elevated prostate specific antigen [PSA]: Secondary | ICD-10-CM

## 2017-09-06 LAB — HEPATIC FUNCTION PANEL
ALBUMIN: 4 g/dL (ref 3.5–5.2)
ALK PHOS: 95 U/L (ref 39–117)
ALT: 24 U/L (ref 0–53)
AST: 23 U/L (ref 0–37)
BILIRUBIN TOTAL: 0.5 mg/dL (ref 0.2–1.2)
Bilirubin, Direct: 0.1 mg/dL (ref 0.0–0.3)
Total Protein: 6.6 g/dL (ref 6.0–8.3)

## 2017-09-06 LAB — LIPID PANEL
CHOL/HDL RATIO: 2
Cholesterol: 85 mg/dL (ref 0–200)
HDL: 40.7 mg/dL (ref >39.00)
LDL Cholesterol: 37 mg/dL (ref 0–99)
NONHDL: 44.44
Triglycerides: 37 mg/dL (ref 0.0–149.0)
VLDL: 7.4 mg/dL (ref 0.0–40.0)

## 2017-09-06 LAB — CBC WITH DIFFERENTIAL/PLATELET
BASOS ABS: 0 10*3/uL (ref 0.0–0.1)
Basophils Relative: 0.4 % (ref 0.0–3.0)
Eosinophils Absolute: 0.1 10*3/uL (ref 0.0–0.7)
Eosinophils Relative: 2.1 % (ref 0.0–5.0)
HCT: 30.6 % — ABNORMAL LOW (ref 39.0–52.0)
HEMOGLOBIN: 9.8 g/dL — AB (ref 13.0–17.0)
LYMPHS ABS: 1.5 10*3/uL (ref 0.7–4.0)
Lymphocytes Relative: 22.4 % (ref 12.0–46.0)
MCHC: 32.1 g/dL (ref 30.0–36.0)
MCV: 83.8 fl (ref 78.0–100.0)
MONOS PCT: 8.7 % (ref 3.0–12.0)
Monocytes Absolute: 0.6 10*3/uL (ref 0.1–1.0)
NEUTROS PCT: 66.4 % (ref 43.0–77.0)
Neutro Abs: 4.3 10*3/uL (ref 1.4–7.7)
Platelets: 172 10*3/uL (ref 150.0–400.0)
RBC: 3.66 Mil/uL — AB (ref 4.22–5.81)
RDW: 18.3 % — ABNORMAL HIGH (ref 11.5–15.5)
WBC: 6.5 10*3/uL (ref 4.0–10.5)

## 2017-09-06 LAB — BASIC METABOLIC PANEL
BUN: 15 mg/dL (ref 6–23)
CALCIUM: 9.2 mg/dL (ref 8.4–10.5)
CO2: 31 meq/L (ref 19–32)
CREATININE: 1.21 mg/dL (ref 0.40–1.50)
Chloride: 102 mEq/L (ref 96–112)
GFR: 62.73 mL/min (ref >60.00)
GLUCOSE: 137 mg/dL — AB (ref 70–99)
Potassium: 5.1 mEq/L (ref 3.5–5.1)
Sodium: 139 mEq/L (ref 135–145)

## 2017-09-06 LAB — PSA: PSA: 5.07 ng/mL — ABNORMAL HIGH (ref 0.10–4.00)

## 2017-09-06 LAB — VITAMIN B12: VITAMIN B 12: 574 pg/mL (ref 211–911)

## 2017-09-06 LAB — IBC PANEL
IRON: 45 ug/dL (ref 42–165)
Saturation Ratios: 9.6 % — ABNORMAL LOW (ref 20.0–50.0)
Transferrin: 335 mg/dL (ref 212.0–360.0)

## 2017-09-06 LAB — HEMOGLOBIN A1C: HEMOGLOBIN A1C: 7.6 % — AB (ref 4.6–6.5)

## 2017-09-06 LAB — TSH: TSH: 1.85 u[IU]/mL (ref 0.35–4.50)

## 2017-09-06 MED ORDER — HYDROCODONE-ACETAMINOPHEN 10-325 MG PO TABS: 1.0000 | ORAL_TABLET | Freq: Every day | ORAL | 0 refills | Status: DC | PRN

## 2017-09-06 NOTE — Assessment & Plan Note (Signed)
stable overall by history and exam, recent data reviewed with pt, and pt to continue medical treatment as before,  to f/u any worsening symptoms or concerns  

## 2017-09-06 NOTE — Assessment & Plan Note (Signed)

## 2017-09-06 NOTE — Progress Notes (Signed)
Subjective:    Patient ID: [name], male    DOB: 1945/05/01, 72 y.o.   MRN: [id number]  HPI  Here for wellness and f/u;  Overall doing ok;  Pt denies Chest pain, worsening SOB, DOE, wheezing, orthopnea, PND, worsening LE edema, palpitations, dizziness or syncope.  Pt denies neurological change such as new headache, facial or extremity weakness.  Pt denies polydipsia, polyuria, or low sugar symptoms. Pt states overall good compliance with treatment and medications, good tolerability, and has been trying to follow appropriate diet.  Pt denies worsening depressive symptoms, suicidal ideation or panic. No fever, night sweats, wt loss, loss of appetite, or other constitutional symptoms.  Pt states good ability with ADL's, has low fall risk, home safety reviewed and adequate, no other significant changes in hearing or vision, and only occasionally active with exercise.  No new complaints.  Asks for refill hydrocodone he takes sparingly for chronic neck and lower back pain, has seen ortho and no other specific tx needed Past Medical History:  Diagnosis Date  . Aortic root dilatation ([place]) 02/02/2011  . Arthritis    "lower back; going back down both my sciatic nerves"  . 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 BENIGN PROSTATIC HYPERTROPHY 08/29/2006   takes Flomax daily  . 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 Cataracts, bilateral    immature  . CHF (congestive heart failure) (HCC)    takes Lasix daily  . Chronic back pain    HNP   . CORONARY ARTERY DISEASE 08/29/2006   takes Coumadin daily  . DEPRESSION 08/29/2006  . DIABETES MELLITUS, TYPE II 08/29/2006   takes Metformin,Januvia,and Glipizide  daily  . DIASTOLIC HEART FAILURE, CHRONIC 06/09/2008  . GOUT 04/22/2007   takes Allopurinol daily  . History of migraine    61yr ago  . HYPERLIPIDEMIA 08/29/2006   takes Atorvastatin daily  . HYPERTENSION 08/29/2006   takes Lisinopril daily  . INSOMNIA-SLEEP DISORDER-UNSPEC 10/23/2007   . Left lumbar radiculopathy 05/30/2010  . LUMBAR RADICULOPATHY, RIGHT 06/10/2007  . Muscle spasm    takes Zanaflex daily  . [place]   "I've had several MIs"  . 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 PACEMAKER, PERMANENT 03/04/2008   pt denies this date  . Peripheral neuropathy    takes Gabapentin daily  . Presence of permanent cardiac pacemaker   . Shortness of breath dyspnea    "all my life" with exertion  . Sleep apnea    "if I lay flat I quit breathing; HOB up I'm fine"   Past Surgical History:  Procedure Laterality Date  . COLONOSCOPY    . CORONARY ANGIOPLASTY WITH STENT PLACEMENT  12/27/10   "I've had a total of 9 cardiac stents put in"  . CORONARY ARTERY BYPASS GRAFT  1992   CABG X 2  . ESOPHAGOGASTRODUODENOSCOPY N/A 12/01/2013   Procedure: ESOPHAGOGASTRODUODENOSCOPY (EGD);  Surgeon: DLafayette Dragon MD;  [facility];  Service: Endoscopy;  Laterality: N/A;  . INSERT / REPLACE / REMOVE PACEMAKER  ~ 2004   initial pacemaker placement  . INSERT / REPLACE / REMOVE PACEMAKER  10/2009   generator change  . LUMBAR LAMINECTOMY/DECOMPRESSION MICRODISCECTOMY Right 03/23/2014   Procedure: LAMINECTOMY AND FORAMINOTOMY RIGHT LUMBAR THREE-FOUR,LUMBAR FOUR-FIVE, LUMBAR FIVE-SACRAL ONE;  Surgeon: HCharlie Pitter MD;  [facility];  Service: Neurosurgery;  Laterality: Right;  right  . LUMBAR LAMINECTOMY/DECOMPRESSION MICRODISCECTOMY Left 12/01/2014   Procedure: Left Lumbar Three-Four, Lumbar Four-Five Laminectomy and Foraminotomy;  Surgeon: Earnie Larsson, MD;  Location: [facility];  Service: Neurosurgery;  Laterality: Left;  . s/p left arm surgury after work accident  1991   "2000# steel fell on it"  . s/p right hand surgury for foreign object  1970's   "piece of wood went in my hand; had to get that out"    reports that he quit smoking about 44 years ago. His smoking use included cigarettes. He has a 27.00 pack-year smoking history. He has  quit using smokeless tobacco. He reports that he does not drink alcohol or use drugs. family history includes Coronary artery disease (age of onset: 62) in his other; Diabetes in his mother, other, and sister; Heart disease in his sister and sister; Lung cancer in his sister. Allergies  Allergen Reactions  . Crestor [Rosuvastatin Calcium] Other (See Comments)    Urination of blood  . Latex Rash and Other (See Comments)    Gloves cause welts on hands!!   Current Outpatient Medications on File Prior to Visit  Medication Sig Dispense Refill  . albuterol (PROVENTIL HFA;VENTOLIN HFA) 108 (90 Base) MCG/ACT inhaler Inhale 2 puffs into the lungs every 6 (six) hours as needed for wheezing or shortness of breath. 1 Inhaler 2  . allopurinol (ZYLOPRIM) 300 MG tablet take 1 tablet by mouth once daily 90 tablet 2  . atorvastatin (LIPITOR) 80 MG tablet Take 1 tablet (80 mg total) by mouth daily. 540 tablet 3  . Blood Glucose Monitoring Suppl (ONE TOUCH ULTRA 2) w/Device KIT Use as directed 1 each 3  . carvedilol (COREG) 25 MG tablet Take 1 tablet (25 mg total) by mouth 2 (two) times daily. 180 tablet 2  . Cholecalciferol (VITAMIN D) 1000 UNITS capsule Take 1,000 Units by mouth 2 (two) times daily. Reported on 02/15/2015    . donepezil (ARICEPT) 5 MG tablet Take 1 tablet (5 mg total) by mouth daily. 90 tablet 3  . fluticasone (FLONASE) 50 MCG/ACT nasal spray USE 2 SPRAYS IN EACH  NOSTRIL EVERY DAY 48 g 0  . furosemide (LASIX) 40 MG tablet Take 2 tablets (80 mg total) by mouth 2 (two) times daily. 540 tablet 2  . gabapentin (NEURONTIN) 300 MG capsule TAKE 2 CAPSULES BY MOUTH 3  TIMES DAILY (Patient taking differently: TAKE 1 CAPSULE BY MOUTH 3  TIMES DAILY) 540 capsule 3  . glipiZIDE (GLUCOTROL XL) 10 MG 24 hr tablet Take 1 tablet (10 mg total) by mouth daily with breakfast. 90 tablet 2  . glucose blood (ONE TOUCH ULTRA TEST) test strip CHECK BLOOD SUGAR TWO TIMES DAILY 100 each 3  . lisinopril  (PRINIVIL,ZESTRIL) 2.5 MG tablet Take 1 tablet (2.5 mg total) by mouth daily. 90 tablet 2  . metFORMIN (GLUCOPHAGE) 500 MG tablet TAKE 2 TABLETS BY MOUTH TWO TIMES DAILY 360 tablet 2  . Multiple Vitamin (MULTIVITAMIN WITH MINERALS) TABS tablet Take 1 tablet by mouth every morning.    Glory Rosebush DELICA LANCETS 00Q MISC USE AS DIRECTED THREE TIMES DAILY TO CHECK BLOOD SUGAR 300 each 0  . oxybutynin (DITROPAN XL) 10 MG 24 hr tablet Take 1 tablet (10 mg total) by mouth at bedtime. 90 tablet 3  . potassium chloride SA (K-DUR,KLOR-CON) 20 MEQ tablet Take 1 tablet (20 mEq total) by mouth every evening. 90 tablet 2  . sitaGLIPtin (JANUVIA) 100 MG tablet Take 1 tablet (100 mg total) by mouth daily. 90 tablet 3  . tamsulosin (FLOMAX) 0.4 MG CAPS capsule TAKE 1 CAPSULE BY MOUTH  EVERY 12 HOURS 180 capsule 2  . tiZANidine (ZANAFLEX) 4 MG tablet     . warfarin (COUMADIN) 5 MG tablet Take 1/2 tablet daily except 1 tablet on Mondays, Wednesdays and Fridays (Patient taking differently: Take 2.5-5 mg by mouth See admin instructions. Take 1/2 tablet daily except 1 tablet on Mondays, Wednesdays and Fridays) 90 tablet 3   No current facility-administered medications on file prior to visit.    Review of Systems Constitutional: Negative for other unusual diaphoresis, sweats, appetite or weight changes HENT: Negative for other worsening hearing loss, ear pain, facial swelling, mouth sores or neck stiffness.   Eyes: Negative for other worsening pain, redness or other visual disturbance.  Respiratory: Negative for other stridor or swelling Cardiovascular: Negative for other palpitations or other chest pain  Gastrointestinal: Negative for worsening diarrhea or loose stools, blood in stool, distention or other pain Genitourinary: Negative for hematuria, flank pain or other change in urine volume.  Musculoskeletal: Negative for myalgias or other joint swelling.  Skin: Negative for other color change, or other wound or  worsening drainage.  Neurological: Negative for other syncope or numbness. Hematological: Negative for other adenopathy or swelling Psychiatric/Behavioral: Negative for hallucinations, other worsening agitation, SI, self-injury, or new decreased concentration All other system neg per pt    Objective:   Physical Exam BP 114/68   Pulse 81   Temp 97.7 F (36.5 C) (Oral)   Ht '5\' 11"'$  (1.803 m)   Wt 217 lb (98.4 kg)   SpO2 94%   BMI 30.27 kg/m  VS noted, overwt, walks with cane Constitutional: Pt is oriented to person, place, and time. Appears well-developed and well-nourished, in no significant distress and comfortable Head: Normocephalic and atraumatic  Eyes: Conjunctivae and EOM are normal. Pupils are equal, round, and reactive to light Right Ear: External ear normal without discharge Left Ear: External ear normal without discharge Nose: Nose without discharge or deformity Mouth/Throat: Oropharynx is without other ulcerations and moist  Neck: Normal range of motion. Neck supple. No JVD present. No tracheal deviation present or significant neck LA or mass Cardiovascular: Normal rate, regular rhythm, normal heart sounds and intact distal pulses.   Pulmonary/Chest: WOB normal and breath sounds without rales or wheezing  Abdominal: Soft. Bowel sounds are normal. NT. No HSM  Musculoskeletal: Normal range of motion. Exhibits trace chronic bilat leg edema Lymphadenopathy: Has no other cervical adenopathy.  Neurological: Pt is alert and oriented to person, place, and time. Pt has normal reflexes. No cranial nerve deficit. Motor grossly intact, Gait intact Skin: Skin is warm and dry. No rash noted or new ulcerations Psychiatric:  Has nervous mood and affect. Behavior is normal without agitation No other exam findings  Lab Results  Component Value Date   WBC 5.2 06/17/2017   HGB 9.3 (L) 06/17/2017   HCT 30.9 (L) 06/17/2017   PLT 124 (L) 06/17/2017   GLUCOSE 183 (H) 06/28/2017   CHOL 94  06/15/2017   TRIG 80.0 06/15/2017   HDL 29.10 (L) 06/15/2017   LDLCALC 49 06/15/2017   ALT 56 06/16/2017   AST 57 (H) 06/16/2017   NA 139 06/28/2017   K 4.5 06/28/2017   CL 96 06/28/2017   CREATININE 1.20 06/28/2017   BUN 14 06/28/2017   CO2 27 06/28/2017   TSH 1.11 06/15/2017   PSA 4.81 (H) 06/15/2017   INR 3.0 08/20/2017   HGBA1C 8.0 (H) 06/15/2017   MICROALBUR 2.8 (H) 06/15/2017        Assessment &  Plan:

## 2017-09-06 NOTE — Patient Instructions (Addendum)
Your pain medication was refilled today  Please continue all other medications as before, and refills have been done if requested.  Please have the pharmacy call with any other refills you may need.  Please continue your efforts at being more active, low cholesterol diet, and weight control.  You are otherwise up to date with prevention measures today.  Please keep your appointments with your specialists as you may have planned  Please go to the LAB in the Basement (turn left off the elevator) for the tests to be done today  You will be contacted by phone if any changes need to be made immediately.  Otherwise, you will receive a letter about your results with an explanation, but please check with MyChart first.  Please remember to sign up for MyChart if you have not done so, as this will be important to you in the future with finding out test results, communicating by private email, and scheduling acute appointments online when needed.  Please return in 6 months, or sooner if needed

## 2017-09-07 ENCOUNTER — Telehealth: Payer: Self-pay | Admitting: Internal Medicine

## 2017-09-07 MED ORDER — HYDROCODONE-ACETAMINOPHEN 10-325 MG PO TABS: 1.0000 | ORAL_TABLET | Freq: Every day | ORAL | 0 refills | Status: DC | PRN

## 2017-09-07 NOTE — Telephone Encounter (Signed)
Done erx 

## 2017-09-14 ENCOUNTER — Telehealth: Payer: Self-pay | Admitting: Internal Medicine

## 2017-09-14 NOTE — Telephone Encounter (Signed)
Copied from [facility] [id number]. Topic: Quick Communication - See Telephone Encounter >> Sep 14, 2017  9:20 AM Mylinda Latina, NT wrote: CRM for notification. See Telephone encounter for: 09/14/17. Shanna calling from [facility] needs clarification on the patient medication HYDROcodone-acetaminophen (NORCO) 10-325 MG tablet .  She states that the pharmacist noticed that the RX was sent to them and also filled locally. She needs clarification if this medication needs to be filled. CB# [phone] reference [id number]

## 2017-09-14 NOTE — Telephone Encounter (Signed)
Call placed to [facility] in response to question/ clarification about Hydrocodone-Acetaminophen.  Spoke with Pharmacist, Apolonio Schneiders.  Advised that the Rx for this medication that had been sent to [facility], was discontinued and reordered at the pt's local pharmacy on 09/07/17.  Advised to disregard the Rx for this med, ordered on 09/06/17.  Verb. Understanding.

## 2017-09-14 NOTE — Telephone Encounter (Signed)
Called pt. And made aware that [facility] was notified of the order for Hydrocodone Acetaminophen being cancelled, since it was ordered at his local pharmacy.  Pt. Confirmed that he got the Rx from his local pharmacy.  Denied any other needs at this time.

## 2017-09-18 ENCOUNTER — Telehealth: Payer: Self-pay | Admitting: Internal Medicine

## 2017-09-18 NOTE — Telephone Encounter (Signed)
error 

## 2017-09-18 NOTE — Telephone Encounter (Signed)
Pt states that he plans to fu with PCP and call us back if needed.

## 2017-09-18 NOTE — Telephone Encounter (Signed)
New Message:    Pt states he believe his pacemaker is causing him to sleep.

## 2017-09-20 ENCOUNTER — Encounter: Payer: Self-pay | Admitting: Internal Medicine

## 2017-09-20 ENCOUNTER — Ambulatory Visit (INDEPENDENT_AMBULATORY_CARE_PROVIDER_SITE_OTHER): Payer: Medicare Other | Admitting: Internal Medicine

## 2017-09-20 ENCOUNTER — Other Ambulatory Visit: Payer: Self-pay | Admitting: Internal Medicine

## 2017-09-20 VITALS — BP 116/72 | HR 90 | Temp 97.4°F | Ht 71.0 in | Wt 214.0 lb

## 2017-09-20 DIAGNOSIS — R072 Precordial pain: Secondary | ICD-10-CM

## 2017-09-20 DIAGNOSIS — N289 Disorder of kidney and ureter, unspecified: Secondary | ICD-10-CM

## 2017-09-20 DIAGNOSIS — G4710 Hypersomnia, unspecified: Secondary | ICD-10-CM | POA: Diagnosis not present

## 2017-09-20 NOTE — Assessment & Plan Note (Signed)
No obvious cause, suspect related to dementia and comorbids,  to f/u any worsening symptoms or concerns

## 2017-09-20 NOTE — Patient Instructions (Signed)
Please continue all other medications as before, and refills have been done if requested.  Please have the pharmacy call with any other refills you may need.  Please continue your efforts at being more active, low cholesterol diet, and weight control.  You are otherwise up to date with prevention measures today.  Please keep your appointments with your specialists as you may have planned  Please go to the XRAY Department in the Basement (go straight as you get off the elevator) for the x-ray testing  Please go to the LAB in the Basement (turn left off the elevator) for the tests to be done today  You will be contacted by phone if any changes need to be made immediately.  Otherwise, you will receive a letter about your results with an explanation, but please check with MyChart first.  Please remember to sign up for MyChart if you have not done so, as this will be important to you in the future with finding out test results, communicating by private email, and scheduling acute appointments online when needed.  

## 2017-09-20 NOTE — Assessment & Plan Note (Signed)
Also for UA today, but he has no dysuria and may not present to lab, as he is eager for home as his ride is here

## 2017-09-20 NOTE — Telephone Encounter (Signed)
Last OV 09/06/17  Do not show last refill. Okay to refill?

## 2017-09-20 NOTE — Progress Notes (Signed)
Subjective:    Patient ID: [name], male    DOB: 1945/07/16, 72 y.o.   MRN: [id number]  HPI  Here with sleeping much more than usual since mon am aug 19, told wife "Somethings I dont feel right."  Maybe some better after meds, but felt tired, slept all morning to lunch, had lunch then slept the rest of the afternoon to dinner.  Sleepy with movement or sitting.  Has been checked for sleep apnea about 7 yrs ago.  Has some orthopne mild "all life".,  No fever or cough, Denies worsening depressive symptoms, suicidal ideation, or panic,.  Pt denies chest pain, increased sob or doe, wheezing, orthopnea, PND, increased LE swelling, palpitations, dizziness or syncope except for recurring fleeting CP's anterior for a few wks, last stress test neg June 2019.  No recent med changes though intermittent compliance with meds has been a question.  Pt denies new neurological symptoms such as new headache, or facial or extremity weakness or numbness   Pt denies polydipsia, polyuria.  Does have ongoing nocturia no change and Denies urinary symptoms such as dysuria, frequency, urgency, flank pain, hematuria or n/v, fever, chills. Past Medical History:  Diagnosis Date  . Aortic root dilatation ([place]) 02/02/2011  . Arthritis    "lower back; going back down both my sciatic nerves"  . 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 BENIGN PROSTATIC HYPERTROPHY 08/29/2006   takes Flomax daily  . 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 Cataracts, bilateral    immature  . CHF (congestive heart failure) (HCC)    takes Lasix daily  . Chronic back pain    HNP   . CORONARY ARTERY DISEASE 08/29/2006   takes Coumadin daily  . DEPRESSION 08/29/2006  . DIABETES MELLITUS, TYPE II 08/29/2006   takes Metformin,Januvia,and Glipizide  daily  . DIASTOLIC HEART FAILURE, CHRONIC 06/09/2008  . GOUT 04/22/2007   takes Allopurinol daily  . History of migraine    21yr ago  . HYPERLIPIDEMIA 08/29/2006   takes Atorvastatin  daily  . HYPERTENSION 08/29/2006   takes Lisinopril daily  . INSOMNIA-SLEEP DISORDER-UNSPEC 10/23/2007  . Left lumbar radiculopathy 05/30/2010  . LUMBAR RADICULOPATHY, RIGHT 06/10/2007  . Muscle spasm    takes Zanaflex daily  . [place]   "I've had several MIs"  . 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 PACEMAKER, PERMANENT 03/04/2008   pt denies this date  . Peripheral neuropathy    takes Gabapentin daily  . Presence of permanent cardiac pacemaker   . Shortness of breath dyspnea    "all my life" with exertion  . Sleep apnea    "if I lay flat I quit breathing; HOB up I'm fine"   Past Surgical History:  Procedure Laterality Date  . COLONOSCOPY    . CORONARY ANGIOPLASTY WITH STENT PLACEMENT  12/27/10   "I've had a total of 9 cardiac stents put in"  . CORONARY ARTERY BYPASS GRAFT  1992   CABG X 2  . ESOPHAGOGASTRODUODENOSCOPY N/A 12/01/2013   Procedure: ESOPHAGOGASTRODUODENOSCOPY (EGD);  Surgeon: DLafayette Dragon MD;  [facility];  Service: Endoscopy;  Laterality: N/A;  . INSERT / REPLACE / REMOVE PACEMAKER  ~ 2004   initial pacemaker placement  . INSERT / REPLACE / REMOVE PACEMAKER  10/2009   generator change  . LUMBAR LAMINECTOMY/DECOMPRESSION MICRODISCECTOMY Right 03/23/2014   Procedure: LAMINECTOMY AND FORAMINOTOMY RIGHT LUMBAR THREE-FOUR,LUMBAR FOUR-FIVE, LUMBAR FIVE-SACRAL ONE;  Surgeon: HCharlie Pitter MD;  Location: [facility];  Service: Neurosurgery;  Laterality: Right;  right  . LUMBAR LAMINECTOMY/DECOMPRESSION MICRODISCECTOMY Left 12/01/2014   Procedure: Left Lumbar Three-Four, Lumbar Four-Five Laminectomy and Foraminotomy;  Surgeon: Earnie Larsson, MD;  Location: [place] [facility];  Service: Neurosurgery;  Laterality: Left;  . s/p left arm surgury after work accident  1991   "2000# steel fell on it"  . s/p right hand surgury for foreign object  1970's   "piece of wood went in my hand; had to get that out"    reports that he quit smoking about  44 years ago. His smoking use included cigarettes. He has a 27.00 pack-year smoking history. He has quit using smokeless tobacco. He reports that he does not drink alcohol or use drugs. family history includes Coronary artery disease (age of onset: 49) in his other; Diabetes in his mother, other, and sister; Heart disease in his sister and sister; Lung cancer in his sister. Allergies  Allergen Reactions  . Crestor [Rosuvastatin Calcium] Other (See Comments)    Urination of blood  . Latex Rash and Other (See Comments)    Gloves cause welts on hands!!   Current Outpatient Medications on File Prior to Visit  Medication Sig Dispense Refill  . albuterol (PROVENTIL HFA;VENTOLIN HFA) 108 (90 Base) MCG/ACT inhaler Inhale 2 puffs into the lungs every 6 (six) hours as needed for wheezing or shortness of breath. 1 Inhaler 2  . allopurinol (ZYLOPRIM) 300 MG tablet take 1 tablet by mouth once daily 90 tablet 2  . atorvastatin (LIPITOR) 80 MG tablet Take 1 tablet (80 mg total) by mouth daily. 540 tablet 3  . Blood Glucose Monitoring Suppl (ONE TOUCH ULTRA 2) w/Device KIT Use as directed 1 each 3  . carvedilol (COREG) 25 MG tablet Take 1 tablet (25 mg total) by mouth 2 (two) times daily. 180 tablet 2  . Cholecalciferol (VITAMIN D) 1000 UNITS capsule Take 1,000 Units by mouth 2 (two) times daily. Reported on 02/15/2015    . donepezil (ARICEPT) 5 MG tablet Take 1 tablet (5 mg total) by mouth daily. 90 tablet 3  . fluticasone (FLONASE) 50 MCG/ACT nasal spray USE 2 SPRAYS IN EACH  NOSTRIL EVERY DAY 48 g 0  . furosemide (LASIX) 40 MG tablet Take 2 tablets (80 mg total) by mouth 2 (two) times daily. 540 tablet 2  . gabapentin (NEURONTIN) 300 MG capsule TAKE 2 CAPSULES BY MOUTH 3  TIMES DAILY (Patient taking differently: TAKE 1 CAPSULE BY MOUTH 3  TIMES DAILY) 540 capsule 3  . glipiZIDE (GLUCOTROL XL) 10 MG 24 hr tablet Take 1 tablet (10 mg total) by mouth daily with breakfast. 90 tablet 2  . glucose blood (ONE TOUCH  ULTRA TEST) test strip CHECK BLOOD SUGAR TWO TIMES DAILY 100 each 3  . HYDROcodone-acetaminophen (NORCO) 10-325 MG tablet Take 1 tablet by mouth daily as needed. 30 tablet 0  . lisinopril (PRINIVIL,ZESTRIL) 2.5 MG tablet Take 1 tablet (2.5 mg total) by mouth daily. 90 tablet 2  . metFORMIN (GLUCOPHAGE) 500 MG tablet TAKE 2 TABLETS BY MOUTH TWO TIMES DAILY 360 tablet 2  . Multiple Vitamin (MULTIVITAMIN WITH MINERALS) TABS tablet Take 1 tablet by mouth every morning.    Glory Rosebush DELICA LANCETS 12W MISC USE AS DIRECTED THREE TIMES DAILY TO CHECK BLOOD SUGAR 300 each 0  . oxybutynin (DITROPAN XL) 10 MG 24 hr tablet Take 1 tablet (10 mg total) by mouth at bedtime. 90 tablet 3  . potassium chloride SA (K-DUR,KLOR-CON) 20 MEQ tablet Take 1  tablet (20 mEq total) by mouth every evening. 90 tablet 2  . sitaGLIPtin (JANUVIA) 100 MG tablet Take 1 tablet (100 mg total) by mouth daily. 90 tablet 3  . tamsulosin (FLOMAX) 0.4 MG CAPS capsule TAKE 1 CAPSULE BY MOUTH  EVERY 12 HOURS 180 capsule 2  . tiZANidine (ZANAFLEX) 4 MG tablet     . warfarin (COUMADIN) 5 MG tablet Take 1/2 tablet daily except 1 tablet on Mondays, Wednesdays and Fridays (Patient taking differently: Take 2.5-5 mg by mouth See admin instructions. Take 1/2 tablet daily except 1 tablet on Mondays, Wednesdays and Fridays) 90 tablet 3   No current facility-administered medications on file prior to visit.    Review of Systems  Constitutional: Negative for other unusual diaphoresis or sweats HENT: Negative for ear discharge or swelling Eyes: Negative for other worsening visual disturbances Respiratory: Negative for stridor or other swelling  Gastrointestinal: Negative for worsening distension or other blood Genitourinary: Negative for retention or other urinary change Musculoskeletal: Negative for other MSK pain or swelling Skin: Negative for color change or other new lesions Neurological: Negative for worsening tremors and other numbness    Psychiatric/Behavioral: Negative for worsening agitation or other fatigue All other system neg per pt    Objective:   Physical Exam BP 116/72 (BP Location: Left Arm, Patient Position: Sitting, Cuff Size: Normal)   Pulse 90   Temp (!) 97.4 F (36.3 C) (Oral)   Ht _0  (1.803 m)   Wt 214 lb (97.1 kg)   SpO2 94%   BMI 29.85 kg/m  VS noted,  Constitutional: Pt appears in NAD HENT: Head: NCAT.  Right Ear: External ear normal.  Left Ear: External ear normal.  Eyes: . Pupils are equal, round, and reactive to light. Conjunctivae and EOM are normal Nose: without d/c or deformity Neck: Neck supple. Gross normal ROM Cardiovascular: Normal rate and regular rhythm.   Pulmonary/Chest: Effort normal and breath sounds without rales or wheezing.  Abd:  Soft, NT, ND, + BS, no organomegaly Neurological: Pt is alert. At baseline orientation, motor grossly intact Skin: Skin is warm. No rashes, other new lesions, no LE edema Psychiatric: Pt behavior is normal without agitation  No other exam findings Lab Results  Component Value Date   WBC 6.5 09/06/2017   HGB 9.8 (L) 09/06/2017   HCT 30.6 (L) 09/06/2017   PLT 172.0 09/06/2017   GLUCOSE 137 (H) 09/06/2017   CHOL 85 09/06/2017   TRIG 37.0 09/06/2017   HDL 40.70 09/06/2017   LDLCALC 37 09/06/2017   ALT 24 09/06/2017   AST 23 09/06/2017   NA 139 09/06/2017   K 5.1 09/06/2017   CL 102 09/06/2017   CREATININE 1.21 09/06/2017   BUN 15 09/06/2017   CO2 31 09/06/2017   TSH 1.85 09/06/2017   PSA 5.07 (H) 09/06/2017   INR 3.0 08/20/2017   HGBA1C 7.6 (H) 09/06/2017   MICROALBUR 2.8 (H) 06/15/2017       Assessment & Plan:

## 2017-09-20 NOTE — Assessment & Plan Note (Signed)
Atypical , recent stress test low risk, doubt cardiac, declines ecg, I will order cxr but he says he is not sure if he will have this done today

## 2017-09-24 ENCOUNTER — Ambulatory Visit (INDEPENDENT_AMBULATORY_CARE_PROVIDER_SITE_OTHER)
Admission: RE | Admit: 2017-09-24 | Discharge: 2017-09-24 | Disposition: A | Discharge status: Home / Self Care | Payer: Medicare Other | Source: Ambulatory Visit | Attending: Internal Medicine | Admitting: Internal Medicine

## 2017-09-24 ENCOUNTER — Other Ambulatory Visit (INDEPENDENT_AMBULATORY_CARE_PROVIDER_SITE_OTHER): Payer: Medicare Other

## 2017-09-24 ENCOUNTER — Encounter: Payer: Self-pay | Admitting: Internal Medicine

## 2017-09-24 DIAGNOSIS — IMO0002 Reserved for concepts with insufficient information to code with codable children: Secondary | ICD-10-CM

## 2017-09-24 DIAGNOSIS — R072 Precordial pain: Secondary | ICD-10-CM | POA: Diagnosis not present

## 2017-09-24 DIAGNOSIS — E1140 Type 2 diabetes mellitus with diabetic neuropathy, unspecified: Secondary | ICD-10-CM | POA: Diagnosis not present

## 2017-09-24 DIAGNOSIS — E1165 Type 2 diabetes mellitus with hyperglycemia: Secondary | ICD-10-CM | POA: Diagnosis not present

## 2017-09-24 DIAGNOSIS — N289 Disorder of kidney and ureter, unspecified: Secondary | ICD-10-CM

## 2017-09-24 DIAGNOSIS — R079 Chest pain, unspecified: Secondary | ICD-10-CM | POA: Diagnosis not present

## 2017-09-24 LAB — MICROALBUMIN / CREATININE URINE RATIO
Creatinine,U: 107.6 mg/dL
MICROALB/CREAT RATIO: 2.9 mg/g (ref 0.0–30.0)
Microalb, Ur: 3.1 mg/dL — ABNORMAL HIGH (ref 0.0–1.9)

## 2017-09-24 LAB — URINALYSIS, ROUTINE W REFLEX MICROSCOPIC
BILIRUBIN URINE: NEGATIVE
Hgb urine dipstick: NEGATIVE
Ketones, ur: NEGATIVE
Leukocytes, UA: NEGATIVE
Nitrite: NEGATIVE
PH: 5.5 (ref 5.0–8.0)
Specific Gravity, Urine: 1.025 (ref 1.000–1.030)
TOTAL PROTEIN, URINE-UPE24: NEGATIVE
UROBILINOGEN UA: 0.2 (ref 0.0–1.0)
Urine Glucose: NEGATIVE

## 2017-09-26 ENCOUNTER — Ambulatory Visit (INDEPENDENT_AMBULATORY_CARE_PROVIDER_SITE_OTHER): Payer: Medicare Other | Admitting: Pharmacist Clinician (PhC)/ Clinical Pharmacy Specialist

## 2017-09-26 DIAGNOSIS — I483 Typical atrial flutter: Secondary | ICD-10-CM | POA: Diagnosis not present

## 2017-09-26 DIAGNOSIS — I4891 Unspecified atrial fibrillation: Secondary | ICD-10-CM | POA: Diagnosis not present

## 2017-09-26 DIAGNOSIS — Z7901 Long term (current) use of anticoagulants: Secondary | ICD-10-CM | POA: Diagnosis not present

## 2017-09-26 LAB — POCT INR: INR: 2.5 (ref 2.0–3.0)

## 2017-09-26 NOTE — Patient Instructions (Signed)
Description   Continue taking 1 tablet each Monday, Wednesday and Friday, 1/2 tablet all other days.  Repeat INR in 4 weeks.      Call [facility] at [phone] for questions/scheduling

## 2017-10-24 ENCOUNTER — Ambulatory Visit (INDEPENDENT_AMBULATORY_CARE_PROVIDER_SITE_OTHER): Payer: Medicare Other | Admitting: Pharmacist

## 2017-10-24 DIAGNOSIS — Z7901 Long term (current) use of anticoagulants: Secondary | ICD-10-CM

## 2017-10-24 DIAGNOSIS — I483 Typical atrial flutter: Secondary | ICD-10-CM

## 2017-10-24 DIAGNOSIS — I4891 Unspecified atrial fibrillation: Secondary | ICD-10-CM | POA: Diagnosis not present

## 2017-10-24 LAB — POCT INR: INR: 2.9 (ref 2.0–3.0)

## 2017-11-08 ENCOUNTER — Other Ambulatory Visit: Payer: Self-pay | Admitting: Internal Medicine

## 2017-11-28 ENCOUNTER — Ambulatory Visit (INDEPENDENT_AMBULATORY_CARE_PROVIDER_SITE_OTHER): Payer: Medicare Other | Admitting: Pharmacist Clinician (PhC)/ Clinical Pharmacy Specialist

## 2017-11-28 DIAGNOSIS — I4891 Unspecified atrial fibrillation: Secondary | ICD-10-CM | POA: Diagnosis not present

## 2017-11-28 DIAGNOSIS — I483 Typical atrial flutter: Secondary | ICD-10-CM | POA: Diagnosis not present

## 2017-11-28 DIAGNOSIS — Z7901 Long term (current) use of anticoagulants: Secondary | ICD-10-CM | POA: Diagnosis not present

## 2017-11-28 LAB — POCT INR: INR: 2.7 (ref 2.0–3.0)

## 2017-11-28 NOTE — Patient Instructions (Signed)
Description   Continue taking 1 tablet each Monday, Wednesday and Friday, 1/2 tablet all other days.  Repeat INR in 4 weeks.

## 2017-12-11 ENCOUNTER — Other Ambulatory Visit: Payer: Self-pay | Admitting: Internal Medicine

## 2017-12-11 DIAGNOSIS — I5032 Chronic diastolic (congestive) heart failure: Principal | ICD-10-CM

## 2017-12-11 DIAGNOSIS — I38 Endocarditis, valve unspecified: Secondary | ICD-10-CM

## 2017-12-12 ENCOUNTER — Ambulatory Visit (INDEPENDENT_AMBULATORY_CARE_PROVIDER_SITE_OTHER): Payer: Medicare Other | Admitting: Orthopaedic Surgery

## 2017-12-18 ENCOUNTER — Other Ambulatory Visit: Payer: Self-pay | Admitting: Cardiology

## 2017-12-21 ENCOUNTER — Ambulatory Visit (INDEPENDENT_AMBULATORY_CARE_PROVIDER_SITE_OTHER): Payer: Medicare Other | Admitting: Family Medicine

## 2017-12-21 ENCOUNTER — Ambulatory Visit: Payer: Medicare Other | Admitting: Nurse Practitioner

## 2017-12-21 ENCOUNTER — Ambulatory Visit: Payer: Self-pay

## 2017-12-21 ENCOUNTER — Encounter: Payer: Self-pay | Admitting: Family Medicine

## 2017-12-21 VITALS — BP 126/68 | HR 83 | Ht 71.0 in | Wt 212.0 lb

## 2017-12-21 DIAGNOSIS — R531 Weakness: Secondary | ICD-10-CM | POA: Diagnosis not present

## 2017-12-21 NOTE — Telephone Encounter (Signed)
Pt. called to report having balance difficulty and weakness this morning, that started when he got up.  Reported when he is sitting or laying, he feels fine.  When he stands, he feels like the floor is coming at him.  Stated he feels overall weakness.  Able to use upper extremities without difficulty.  Stated his legs are weak.  Denied feeling dizzy or faint.  Stated has had blurry vision lately in both eyes.  Denied slurred speech.  Denied unilateral weakness of extremities.  Denied any chest pain.  Stated "I stay short of breath all the time."  Denied increase in shortness of breath.  Also c/o loss of appetite today.  Denied nausea/ vomiting.  Denied fever.  Unable to get understanding of pt's. hydration status and fluid intake.  Stated he thinks his urine output is okay.  Reported dry mouth, and that several of his medications dry out his mouth. Stated he drinks [facility] and water.  Advised will need appt. For further eval.  PCP not available; able to schedule appt. With Dr. Raeford Razor at 11:10 AM.  Care advice given per protocol.  Advised that someone else should drive him to the appt.  Advised to go to ER if symptoms progress/ worsen prior to the appt.  Verb. Understanding.       Reason for Disposition . [1] MODERATE weakness (i.e., interferes with work, school, normal activities) AND [2] cause unknown  (Exceptions: weakness with acute minor illness, or weakness from poor fluid intake)  Answer Assessment - Initial Assessment Questions 1. SYMPTOM: "What is the main symptom you are concerned about?" (e.g., weakness, numbness)     When I get up, I feel like the floor is coming at me; feels more weak 2. ONSET: "When did this start?" (minutes, hours, days; while sleeping)     this morning  3. LAST NORMAL: "When was the last time you were normal (no symptoms)?"    Last night when he went to bed   4. PATTERN "Does this come and go, or has it been constant since it started?"  "Is it present now?"  Still feels weak, like he can't control balance 5. CARDIAC SYMPTOMS: "Have you had any of the following symptoms: chest pain, difficulty breathing, palpitations?"    Denied chest pain. Stated he is short winded  6. NEUROLOGIC SYMPTOMS: "Have you had any of the following symptoms: headache, dizziness, vision loss, double vision, changes in speech, unsteady on your feet?"     Vision has been more blurry recently, denied any speech slurring, his balance has become very unsteady   7. OTHER SYMPTOMS: "Do you have any other symptoms?"     Report loss of appetite since this AM; denied nausea; denied fever.  Answer Assessment - Initial Assessment Questions 1. DESCRIPTION: "Describe how you are feeling."    " I feel like when I stand, the floor is coming at me."  Denied feeling like he is going to faint 2. SEVERITY: "How bad is it?"  "Can you stand and walk?"   - MILD - Feels weak or tired, but does not interfere with work, school or normal activities   - [place] to stand and walk; weakness interferes with work, school, or normal activities   - SEVERE - Unable to stand or walk     Mild-moderate 3. ONSET:  "When did the weakness begin?"     This morning 4. CAUSE: "What do you think is causing the weakness?"  unknown 5. MEDICINES: "Have you recently started a new medicine or had a change in the amount of a medicine?"     no 6. OTHER SYMPTOMS: "Do you have any other symptoms?" (e.g., chest pain, fever, cough, SOB, vomiting, diarrhea, bleeding, other areas of pain)     Denied chest pain, weakness of upper extremities, or slurred speech; c/o blurring of vision recently, weakness of lower extremities, shortness of breath: "I stay short of breath";  Protocols used: WEAKNESS (GENERALIZED) AND FATIGUE-A-AH, NEUROLOGIC DEFICIT-A-AH

## 2017-12-21 NOTE — Assessment & Plan Note (Signed)
Unclear as to the source. Has felt better in the office. No vertigo or LOC.  - monitor for now.  - provided indications to follow up. - provided indications to seek immediate care.

## 2017-12-21 NOTE — Progress Notes (Signed)
[name] - 72 y.o. male MRN [id number]  Date of birth: [birth year]  SUBJECTIVE:  Including CC & ROS.  No chief complaint on file.   [name] is a 72 y.o. male that is presenting with weakness. He woke up this morning and felt weak upon walking to the bathroom. He was able to walk to the toilet without falling. Was able to ambulate from the commode back to the bed. He denies any SOB, chest pain, dizziness or fevers. Has felt like his normal self until this morning. No changes in his medications. Was using a cane to make sure he felt more stable.    Review of Systems  Constitutional: Negative for fever.  HENT: Negative for congestion.   Respiratory: Negative for cough.   Cardiovascular: Negative for chest pain.  Gastrointestinal: Negative for abdominal pain.  Musculoskeletal: Negative for back pain.  Skin: Negative for color change.  Neurological: Positive for weakness.  Hematological: Negative for adenopathy.  Psychiatric/Behavioral: Negative for agitation.    HISTORY: Past Medical, Surgical, Social, and Family History Reviewed & Updated per EMR.   Pertinent Historical Findings include:  Past Medical History:  Diagnosis Date  . Aortic root dilatation ([place]) 02/02/2011  . Arthritis    "lower back; going back down both my sciatic nerves"  . 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 BENIGN PROSTATIC HYPERTROPHY 08/29/2006   takes Flomax daily  . 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 Cataracts, bilateral    immature  . CHF (congestive heart failure) ([facility])    takes Lasix daily  . Chronic back pain    HNP   . CORONARY ARTERY DISEASE 08/29/2006   takes Coumadin daily  . DEPRESSION 08/29/2006  . DIABETES MELLITUS, TYPE II 08/29/2006   takes Metformin,Januvia,and Glipizide  daily  . DIASTOLIC HEART FAILURE, CHRONIC 06/09/2008  . GOUT 04/22/2007   takes Allopurinol daily  . History of migraine    78yrs ago  . HYPERLIPIDEMIA 08/29/2006   takes Atorvastatin  daily  . HYPERTENSION 08/29/2006   takes Lisinopril daily  . INSOMNIA-SLEEP DISORDER-UNSPEC 10/23/2007  . Left lumbar radiculopathy 05/30/2010  . LUMBAR RADICULOPATHY, RIGHT 06/10/2007  . Muscle spasm    takes Zanaflex daily  . Myocardial infarction ([place]) 12/27/10   "I've had several MIs"  . 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 PACEMAKER, PERMANENT 03/04/2008   pt denies this date  . Peripheral neuropathy    takes Gabapentin daily  . Presence of permanent cardiac pacemaker   . Shortness of breath dyspnea    "all my life" with exertion  . Sleep apnea    "if I lay flat I quit breathing; HOB up I'm fine"    Past Surgical History:  Procedure Laterality Date  . COLONOSCOPY    . CORONARY ANGIOPLASTY WITH STENT PLACEMENT  12/27/10   "I've had a total of 9 cardiac stents put in"  . CORONARY ARTERY BYPASS GRAFT  1992   CABG X 2  . ESOPHAGOGASTRODUODENOSCOPY N/A 12/01/2013   Procedure: ESOPHAGOGASTRODUODENOSCOPY (EGD);  Surgeon: Lafayette Dragon, MD;  Location: [facility];  Service: Endoscopy;  Laterality: N/A;  . INSERT / REPLACE / REMOVE PACEMAKER  ~ 2004   initial pacemaker placement  . INSERT / REPLACE / REMOVE PACEMAKER  10/2009   generator change  . LUMBAR LAMINECTOMY/DECOMPRESSION MICRODISCECTOMY Right 03/23/2014   Procedure: LAMINECTOMY AND FORAMINOTOMY RIGHT LUMBAR THREE-FOUR,LUMBAR FOUR-FIVE, LUMBAR FIVE-SACRAL ONE;  Surgeon: Charlie Pitter, MD;  Location: [place] [facility];  Service: Neurosurgery;  Laterality:  Right;  right  . LUMBAR LAMINECTOMY/DECOMPRESSION MICRODISCECTOMY Left 12/01/2014   Procedure: Left Lumbar Three-Four, Lumbar Four-Five Laminectomy and Foraminotomy;  Surgeon: Earnie Larsson, MD;  Location: [place] [facility];  Service: Neurosurgery;  Laterality: Left;  . s/p left arm surgury after work accident  1991   "2000# steel fell on it"  . s/p right hand surgury for foreign object  1970's   "piece of wood went in my hand; had to get that out"    Allergies  Allergen Reactions    . Crestor [Rosuvastatin Calcium] Other (See Comments)    Urination of blood  . Latex Rash and Other (See Comments)    Gloves cause welts on hands!!    Family History  Problem Relation Age of Onset  . Diabetes Mother   . Diabetes Sister   . Heart disease Sister        2 sister died with heart disease  . Coronary artery disease Other 31       male, first degree relative  . Diabetes Other        1st degree relative  . Heart disease Sister   . Lung cancer Sister        deceased     Social History   Socioeconomic History  . Marital status: Married    Spouse name: Not on file  . Number of children: 2  . Years of education: Not on file  . Highest education level: Not on file  Occupational History  . Occupation: prior work Designer, industrial/product: UNEMPLOYED  . Occupation: disabled since 2004  Social Needs  . Financial resource strain: Not on file  . Food insecurity:    Worry: Not on file    Inability: Not on file  . Transportation needs:    Medical: Not on file    Non-medical: Not on file  Tobacco Use  . Smoking status: Former Smoker    Packs/day: 3.00    Years: 9.00    Pack years: 27.00    Types: Cigarettes    Last attempt to quit: 02/26/1973    Years since quitting: 44.8  . Smokeless tobacco: Former Systems developer  . Tobacco comment: quit smoking 49yrs ago  Substance and Sexual Activity  . Alcohol use: No    Alcohol/week: 0.0 standard drinks  . Drug use: No    Comment: "used pouches of tobacco for a long time; quit those 12/09/1970"  . Sexual activity: Yes  Lifestyle  . Physical activity:    Days per week: Not on file    Minutes per session: Not on file  . Stress: Not on file  Relationships  . Social connections:    Talks on phone: Not on file    Gets together: Not on file    Attends religious service: Not on file    Active member of club or organization: Not on file    Attends meetings of clubs or organizations: Not on file    Relationship status: Not on  file  . Intimate partner violence:    Fear of current or ex partner: Not on file    Emotionally abused: Not on file    Physically abused: Not on file    Forced sexual activity: Not on file  Other Topics Concern  . Not on file  Social History Narrative  . Not on file     PHYSICAL EXAM:  VS: BP 126/68 (BP Location: Right Arm, Patient Position: Sitting)   Pulse 83  Ht 5\' 11"  (1.803 m)   Wt 212 lb (96.2 kg)   SpO2 94%   BMI 29.57 kg/m  Physical Exam Gen: NAD, alert, cooperative with exam,  ENT: normal lips, normal nasal mucosa,  Eye: normal EOM, normal conjunctiva and lids CV:  no edema, +2 pedal pulses, regular rate and rhythm, S1-S2   Resp: no accessory muscle use, non-labored, clear to auscultation bilaterally, no crackles or wheezes  Skin: no rashes, no areas of induration  Neuro: normal tone, normal sensation to touch Psych:  normal insight, alert and oriented MSK: Normal gait, normal strength       ASSESSMENT & PLAN:   Weakness Unclear as to the source. Has felt better in the office. No vertigo or LOC.  - monitor for now.  - provided indications to follow up. - provided indications to seek immediate care.

## 2017-12-24 ENCOUNTER — Ambulatory Visit (INDEPENDENT_AMBULATORY_CARE_PROVIDER_SITE_OTHER): Payer: Medicare Other | Admitting: Pharmacist Clinician (PhC)/ Clinical Pharmacy Specialist

## 2017-12-24 DIAGNOSIS — I483 Typical atrial flutter: Secondary | ICD-10-CM

## 2017-12-24 DIAGNOSIS — I4891 Unspecified atrial fibrillation: Secondary | ICD-10-CM | POA: Diagnosis not present

## 2017-12-24 DIAGNOSIS — Z7901 Long term (current) use of anticoagulants: Secondary | ICD-10-CM | POA: Diagnosis not present

## 2017-12-24 LAB — POCT INR: INR: 2.8 (ref 2.0–3.0)

## 2018-01-16 ENCOUNTER — Observation Stay (HOSPITAL_COMMUNITY)
Admission: EM | Admit: 2018-01-16 | Discharge: 2018-01-17 | Disposition: A | Discharge status: Home / Self Care | Payer: Medicare Other | Attending: Internal Medicine | Admitting: Internal Medicine

## 2018-01-16 ENCOUNTER — Emergency Department (HOSPITAL_COMMUNITY): Payer: Medicare Other

## 2018-01-16 ENCOUNTER — Other Ambulatory Visit: Payer: Self-pay

## 2018-01-16 ENCOUNTER — Encounter (HOSPITAL_COMMUNITY): Payer: Self-pay | Admitting: Emergency Medicine

## 2018-01-16 DIAGNOSIS — R778 Other specified abnormalities of plasma proteins: Secondary | ICD-10-CM | POA: Diagnosis present

## 2018-01-16 DIAGNOSIS — I252 Old myocardial infarction: Secondary | ICD-10-CM | POA: Diagnosis not present

## 2018-01-16 DIAGNOSIS — IMO0002 Reserved for concepts with insufficient information to code with codable children: Secondary | ICD-10-CM | POA: Diagnosis present

## 2018-01-16 DIAGNOSIS — R05 Cough: Secondary | ICD-10-CM | POA: Diagnosis not present

## 2018-01-16 DIAGNOSIS — R7989 Other specified abnormal findings of blood chemistry: Secondary | ICD-10-CM | POA: Diagnosis not present

## 2018-01-16 DIAGNOSIS — Z7901 Long term (current) use of anticoagulants: Secondary | ICD-10-CM | POA: Diagnosis not present

## 2018-01-16 DIAGNOSIS — I2510 Atherosclerotic heart disease of native coronary artery without angina pectoris: Secondary | ICD-10-CM | POA: Insufficient documentation

## 2018-01-16 DIAGNOSIS — E119 Type 2 diabetes mellitus without complications: Secondary | ICD-10-CM | POA: Diagnosis not present

## 2018-01-16 DIAGNOSIS — M25562 Pain in left knee: Secondary | ICD-10-CM | POA: Diagnosis present

## 2018-01-16 DIAGNOSIS — I5032 Chronic diastolic (congestive) heart failure: Secondary | ICD-10-CM

## 2018-01-16 DIAGNOSIS — M79605 Pain in left leg: Secondary | ICD-10-CM

## 2018-01-16 DIAGNOSIS — J189 Pneumonia, unspecified organism: Secondary | ICD-10-CM

## 2018-01-16 DIAGNOSIS — E785 Hyperlipidemia, unspecified: Secondary | ICD-10-CM | POA: Diagnosis present

## 2018-01-16 DIAGNOSIS — J9601 Acute respiratory failure with hypoxia: Principal | ICD-10-CM

## 2018-01-16 DIAGNOSIS — M79662 Pain in left lower leg: Secondary | ICD-10-CM | POA: Diagnosis not present

## 2018-01-16 DIAGNOSIS — I4891 Unspecified atrial fibrillation: Secondary | ICD-10-CM

## 2018-01-16 DIAGNOSIS — J209 Acute bronchitis, unspecified: Secondary | ICD-10-CM

## 2018-01-16 DIAGNOSIS — I110 Hypertensive heart disease with heart failure: Secondary | ICD-10-CM | POA: Diagnosis not present

## 2018-01-16 DIAGNOSIS — E7800 Pure hypercholesterolemia, unspecified: Secondary | ICD-10-CM

## 2018-01-16 DIAGNOSIS — Z950 Presence of cardiac pacemaker: Secondary | ICD-10-CM | POA: Diagnosis not present

## 2018-01-16 DIAGNOSIS — Z951 Presence of aortocoronary bypass graft: Secondary | ICD-10-CM | POA: Diagnosis not present

## 2018-01-16 DIAGNOSIS — Z79899 Other long term (current) drug therapy: Secondary | ICD-10-CM | POA: Diagnosis not present

## 2018-01-16 DIAGNOSIS — E1140 Type 2 diabetes mellitus with diabetic neuropathy, unspecified: Secondary | ICD-10-CM

## 2018-01-16 DIAGNOSIS — E1165 Type 2 diabetes mellitus with hyperglycemia: Secondary | ICD-10-CM

## 2018-01-16 DIAGNOSIS — R0602 Shortness of breath: Secondary | ICD-10-CM | POA: Diagnosis not present

## 2018-01-16 DIAGNOSIS — I10 Essential (primary) hypertension: Secondary | ICD-10-CM

## 2018-01-16 LAB — GLUCOSE, CAPILLARY
Glucose-Capillary: 107 mg/dL — ABNORMAL HIGH (ref 70–99)
Glucose-Capillary: 180 mg/dL — ABNORMAL HIGH (ref 70–99)

## 2018-01-16 LAB — COMPREHENSIVE METABOLIC PANEL
ALT: 28 U/L (ref 0–44)
AST: 25 U/L (ref 15–41)
Albumin: 3.9 g/dL (ref 3.5–5.0)
Alkaline Phosphatase: 63 U/L (ref 38–126)
Anion gap: 9 (ref 5–15)
BUN: 29 mg/dL — ABNORMAL HIGH (ref 8–23)
CO2: 27 mmol/L (ref 22–32)
Calcium: 8.7 mg/dL — ABNORMAL LOW (ref 8.9–10.3)
Chloride: 102 mmol/L (ref 98–111)
Creatinine, Ser: 1.4 mg/dL — ABNORMAL HIGH (ref 0.61–1.24)
GFR calc Af Amer: 58 mL/min — ABNORMAL LOW (ref >60)
GFR calc non Af Amer: 50 mL/min — ABNORMAL LOW (ref >60)
Glucose, Bld: 138 mg/dL — ABNORMAL HIGH (ref 70–99)
Potassium: 4 mmol/L (ref 3.5–5.1)
Sodium: 138 mmol/L (ref 135–145)
TOTAL PROTEIN: 7 g/dL (ref 6.5–8.1)
Total Bilirubin: 0.6 mg/dL (ref 0.3–1.2)

## 2018-01-16 LAB — CBC WITH DIFFERENTIAL/PLATELET
Abs Immature Granulocytes: 0.03 10*3/uL (ref 0.00–0.07)
Basophils Absolute: 0 10*3/uL (ref 0.0–0.1)
Basophils Relative: 0 %
EOS PCT: 1 %
Eosinophils Absolute: 0.1 10*3/uL (ref 0.0–0.5)
HEMATOCRIT: 31.5 % — AB (ref 39.0–52.0)
Hemoglobin: 9.2 g/dL — ABNORMAL LOW (ref 13.0–17.0)
Immature Granulocytes: 0 %
Lymphocytes Relative: 21 %
Lymphs Abs: 1.7 10*3/uL (ref 0.7–4.0)
MCH: 25.7 pg — ABNORMAL LOW (ref 26.0–34.0)
MCHC: 29.2 g/dL — ABNORMAL LOW (ref 30.0–36.0)
MCV: 88 fL (ref 80.0–100.0)
MONO ABS: 0.9 10*3/uL (ref 0.1–1.0)
Monocytes Relative: 12 %
Neutro Abs: 5.3 10*3/uL (ref 1.7–7.7)
Neutrophils Relative %: 66 %
Platelets: 151 10*3/uL (ref 150–400)
RBC: 3.58 MIL/uL — ABNORMAL LOW (ref 4.22–5.81)
RDW: 16.8 % — ABNORMAL HIGH (ref 11.5–15.5)
WBC: 8.1 10*3/uL (ref 4.0–10.5)
nRBC: 0 % (ref 0.0–0.2)

## 2018-01-16 LAB — URINALYSIS, ROUTINE W REFLEX MICROSCOPIC
Bilirubin Urine: NEGATIVE
Glucose, UA: NEGATIVE mg/dL
Hgb urine dipstick: NEGATIVE
Ketones, ur: NEGATIVE mg/dL
Leukocytes, UA: NEGATIVE
Nitrite: NEGATIVE
Protein, ur: NEGATIVE mg/dL
Specific Gravity, Urine: 1.014 (ref 1.005–1.030)
pH: 5 (ref 5.0–8.0)

## 2018-01-16 LAB — PROTIME-INR
INR: 2.35
PROTHROMBIN TIME: 25.4 s — AB (ref 11.4–15.2)

## 2018-01-16 LAB — INFLUENZA PANEL BY PCR (TYPE A & B)
Influenza A By PCR: NEGATIVE
Influenza B By PCR: NEGATIVE

## 2018-01-16 LAB — BRAIN NATRIURETIC PEPTIDE: B Natriuretic Peptide: 332 pg/mL — ABNORMAL HIGH (ref 0.0–100.0)

## 2018-01-16 LAB — TROPONIN I
TROPONIN I: 0.09 ng/mL — AB (ref <0.03)
Troponin I: 0.09 ng/mL (ref <0.03)

## 2018-01-16 MED ORDER — SODIUM CHLORIDE 0.9 % IV SOLN
1.0000 g | Freq: Once | INTRAVENOUS | Status: AC
  Administered 2018-01-16: 1 g via INTRAVENOUS
  Filled 2018-01-16: qty 10

## 2018-01-16 MED ORDER — HYDROCODONE-ACETAMINOPHEN 10-325 MG PO TABS
1.0000 | ORAL_TABLET | Freq: Four times a day (QID) | ORAL | Status: DC | PRN
  Administered 2018-01-16: 1 via ORAL
  Filled 2018-01-16: qty 1

## 2018-01-16 MED ORDER — ACETAMINOPHEN 650 MG RE SUPP: 650.0000 mg | Freq: Four times a day (QID) | RECTAL | Status: DC | PRN

## 2018-01-16 MED ORDER — ALBUTEROL SULFATE (2.5 MG/3ML) 0.083% IN NEBU: 2.5000 mg | INHALATION_SOLUTION | RESPIRATORY_TRACT | Status: DC | PRN

## 2018-01-16 MED ORDER — LINAGLIPTIN 5 MG PO TABS
5.0000 mg | ORAL_TABLET | Freq: Every day | ORAL | Status: DC
  Administered 2018-01-17: 5 mg via ORAL
  Filled 2018-01-16: qty 1

## 2018-01-16 MED ORDER — WARFARIN SODIUM 5 MG PO TABS
5.0000 mg | ORAL_TABLET | Freq: Once | ORAL | Status: AC
  Administered 2018-01-16: 5 mg via ORAL
  Filled 2018-01-16: qty 1

## 2018-01-16 MED ORDER — ALLOPURINOL 300 MG PO TABS
300.0000 mg | ORAL_TABLET | Freq: Every day | ORAL | Status: DC
  Administered 2018-01-17: 300 mg via ORAL
  Filled 2018-01-16: qty 1

## 2018-01-16 MED ORDER — SODIUM CHLORIDE 0.9 % IV SOLN
500.0000 mg | INTRAVENOUS | Status: DC
  Filled 2018-01-16: qty 500

## 2018-01-16 MED ORDER — DM-GUAIFENESIN ER 30-600 MG PO TB12
1.0000 | ORAL_TABLET | Freq: Two times a day (BID) | ORAL | Status: DC
  Administered 2018-01-16 – 2018-01-17 (×2): 1 via ORAL
  Filled 2018-01-16 (×2): qty 1

## 2018-01-16 MED ORDER — TAMSULOSIN HCL 0.4 MG PO CAPS
0.4000 mg | ORAL_CAPSULE | Freq: Two times a day (BID) | ORAL | Status: DC
  Administered 2018-01-16 – 2018-01-17 (×2): 0.4 mg via ORAL
  Filled 2018-01-16 (×2): qty 1

## 2018-01-16 MED ORDER — ONDANSETRON HCL 4 MG PO TABS: 4.0000 mg | ORAL_TABLET | Freq: Four times a day (QID) | ORAL | Status: DC | PRN

## 2018-01-16 MED ORDER — ALBUTEROL SULFATE (2.5 MG/3ML) 0.083% IN NEBU
5.0000 mg | INHALATION_SOLUTION | Freq: Once | RESPIRATORY_TRACT | Status: AC
  Administered 2018-01-16: 5 mg via RESPIRATORY_TRACT
  Filled 2018-01-16: qty 6

## 2018-01-16 MED ORDER — GLIPIZIDE ER 5 MG PO TB24
10.0000 mg | ORAL_TABLET | Freq: Every day | ORAL | Status: DC
  Administered 2018-01-17: 10 mg via ORAL
  Filled 2018-01-16: qty 2

## 2018-01-16 MED ORDER — ACETAMINOPHEN 325 MG PO TABS: 650.0000 mg | ORAL_TABLET | Freq: Four times a day (QID) | ORAL | Status: DC | PRN

## 2018-01-16 MED ORDER — WARFARIN - PHARMACIST DOSING INPATIENT
Freq: Every day | Status: DC
  Administered 2018-01-16: 19:00:00

## 2018-01-16 MED ORDER — SODIUM CHLORIDE 0.9 % IV SOLN
INTRAVENOUS | Status: DC
  Administered 2018-01-16 – 2018-01-17 (×2): via INTRAVENOUS

## 2018-01-16 MED ORDER — METHYLPREDNISOLONE SODIUM SUCC 125 MG IJ SOLR
60.0000 mg | Freq: Two times a day (BID) | INTRAMUSCULAR | Status: DC
  Administered 2018-01-16 – 2018-01-17 (×2): 60 mg via INTRAVENOUS
  Filled 2018-01-16 (×2): qty 2

## 2018-01-16 MED ORDER — IPRATROPIUM-ALBUTEROL 0.5-2.5 (3) MG/3ML IN SOLN
3.0000 mL | Freq: Four times a day (QID) | RESPIRATORY_TRACT | Status: DC
  Administered 2018-01-16 – 2018-01-17 (×2): 3 mL via RESPIRATORY_TRACT
  Filled 2018-01-16 (×2): qty 3

## 2018-01-16 MED ORDER — NICOTINE 21 MG/24HR TD PT24
21.0000 mg | MEDICATED_PATCH | Freq: Every day | TRANSDERMAL | Status: DC
  Administered 2018-01-16: 21 mg via TRANSDERMAL
  Filled 2018-01-16 (×2): qty 1

## 2018-01-16 MED ORDER — INSULIN ASPART 100 UNIT/ML ~~LOC~~ SOLN: 0.0000 [IU] | Freq: Every day | SUBCUTANEOUS | Status: DC

## 2018-01-16 MED ORDER — OXYBUTYNIN CHLORIDE ER 5 MG PO TB24
10.0000 mg | ORAL_TABLET | Freq: Every day | ORAL | Status: DC
  Administered 2018-01-16: 10 mg via ORAL
  Filled 2018-01-16: qty 2

## 2018-01-16 MED ORDER — CARVEDILOL 12.5 MG PO TABS
25.0000 mg | ORAL_TABLET | Freq: Two times a day (BID) | ORAL | Status: DC
  Administered 2018-01-16 – 2018-01-17 (×2): 25 mg via ORAL
  Filled 2018-01-16 (×2): qty 2

## 2018-01-16 MED ORDER — TIZANIDINE HCL 2 MG PO TABS: 2.0000 mg | ORAL_TABLET | Freq: Four times a day (QID) | ORAL | Status: DC | PRN

## 2018-01-16 MED ORDER — SODIUM CHLORIDE 0.9 % IV SOLN
500.0000 mg | Freq: Once | INTRAVENOUS | Status: AC
  Administered 2018-01-16: 500 mg via INTRAVENOUS
  Filled 2018-01-16: qty 500

## 2018-01-16 MED ORDER — GABAPENTIN 300 MG PO CAPS
600.0000 mg | ORAL_CAPSULE | Freq: Three times a day (TID) | ORAL | Status: DC
  Administered 2018-01-16 – 2018-01-17 (×2): 600 mg via ORAL
  Filled 2018-01-16 (×2): qty 2

## 2018-01-16 MED ORDER — DONEPEZIL HCL 5 MG PO TABS
5.0000 mg | ORAL_TABLET | Freq: Every day | ORAL | Status: DC
  Administered 2018-01-16: 5 mg via ORAL
  Filled 2018-01-16: qty 1

## 2018-01-16 MED ORDER — INSULIN ASPART 100 UNIT/ML ~~LOC~~ SOLN
0.0000 [IU] | Freq: Three times a day (TID) | SUBCUTANEOUS | Status: DC
  Administered 2018-01-17: 8 [IU] via SUBCUTANEOUS
  Administered 2018-01-17: 3 [IU] via SUBCUTANEOUS

## 2018-01-16 MED ORDER — SODIUM CHLORIDE 0.9 % IV SOLN
1.0000 g | INTRAVENOUS | Status: DC
  Filled 2018-01-16 (×2): qty 10

## 2018-01-16 MED ORDER — ONDANSETRON HCL 4 MG/2ML IJ SOLN: 4.0000 mg | Freq: Four times a day (QID) | INTRAMUSCULAR | Status: DC | PRN

## 2018-01-16 MED ORDER — ATORVASTATIN CALCIUM 40 MG PO TABS
80.0000 mg | ORAL_TABLET | Freq: Every day | ORAL | Status: DC
  Administered 2018-01-16 – 2018-01-17 (×2): 80 mg via ORAL
  Filled 2018-01-16 (×2): qty 2

## 2018-01-16 NOTE — ED Triage Notes (Signed)
Pt c/o cough, nasal drainage, chest congestion x 1 week, denies n/v/d/fever

## 2018-01-16 NOTE — ED Notes (Signed)
Pt not in room at this time

## 2018-01-16 NOTE — H&P (Signed)
History and Physical    [name] WUJ:[id number] DOB: 07-10-1945 DOA: 01/16/2018  PCP: Biagio Borg, MD  Patient coming from: Home  I have personally briefly reviewed patient's old medical records in [place]  Chief Complaint: Left knee pain and shortness of breath  HPI: [name] is a 72 y.o. male with medical history significant of atrial fibrillation on anticoagulation, status post pacemaker, BPH, coronary artery disease, diabetes, hyperlipidemia, hypertension, presents to the hospital with complaints of left knee pain and shortness of breath.  Describes knee pain as starting approximately 1 week ago.  Denies any specific injury or trauma to his knee.  He says that his pain has progressively gotten worse over the last week.  He is unable to walk on it at this point.  For the past 3 to 4 days, he has had worsening shortness of breath, productive cough of clear sputum, wheezing.  He denies any chest pain.  No vomiting or diarrhea.  ED Course: On arrival to the emergency room, he was noted to have an episode of hypoxia on room air with oxygen saturations in the 80s.  Supplemental oxygen was applied.  Chest x-ray indicates bibasilar atelectasis versus infiltrates.  Troponin noted to be mildly elevated at 0.09, EKG without acute changes.  Creatinine mildly elevated at 1.4.  He is anticoagulated on Coumadin.  X-ray of his left knee indicated possible cortical irregularity indicating possible avulsion injury.  He has been referred for admission.  Review of Systems: As per HPI otherwise 10 point review of systems negative.    Past Medical History:  Diagnosis Date  . Aortic root dilatation ([place]) 02/02/2011  . Arthritis    "lower back; going back down both my sciatic nerves"  . 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 BENIGN PROSTATIC HYPERTROPHY 08/29/2006   takes Flomax daily  . 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 Cataracts, bilateral    immature  . CHF  (congestive heart failure) (HCC)    takes Lasix daily  . Chronic back pain    HNP   . CORONARY ARTERY DISEASE 08/29/2006   takes Coumadin daily  . DEPRESSION 08/29/2006  . DIABETES MELLITUS, TYPE II 08/29/2006   takes Metformin,Januvia,and Glipizide  daily  . DIASTOLIC HEART FAILURE, CHRONIC 06/09/2008  . GOUT 04/22/2007   takes Allopurinol daily  . History of migraine    38yr ago  . HYPERLIPIDEMIA 08/29/2006   takes Atorvastatin daily  . HYPERTENSION 08/29/2006   takes Lisinopril daily  . INSOMNIA-SLEEP DISORDER-UNSPEC 10/23/2007  . Left lumbar radiculopathy 05/30/2010  . LUMBAR RADICULOPATHY, RIGHT 06/10/2007  . Muscle spasm    takes Zanaflex daily  . [place]   "I've had several MIs"  . 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 PACEMAKER, PERMANENT 03/04/2008   pt denies this date  . Peripheral neuropathy    takes Gabapentin daily  . Presence of permanent cardiac pacemaker   . Shortness of breath dyspnea    "all my life" with exertion  . Sleep apnea    "if I lay flat I quit breathing; HOB up I'm fine"    Past Surgical History:  Procedure Laterality Date  . COLONOSCOPY    . CORONARY ANGIOPLASTY WITH STENT PLACEMENT  12/27/10   "I've had a total of 9 cardiac stents put in"  . CORONARY ARTERY BYPASS GRAFT  1992   CABG X 2  . ESOPHAGOGASTRODUODENOSCOPY N/A 12/01/2013   Procedure: ESOPHAGOGASTRODUODENOSCOPY (EGD);  Surgeon: DLafayette Dragon  MD;  Location: [facility];  Service: Endoscopy;  Laterality: N/A;  . INSERT / REPLACE / REMOVE PACEMAKER  ~ 2004   initial pacemaker placement  . INSERT / REPLACE / REMOVE PACEMAKER  10/2009   generator change  . LUMBAR LAMINECTOMY/DECOMPRESSION MICRODISCECTOMY Right 03/23/2014   Procedure: LAMINECTOMY AND FORAMINOTOMY RIGHT LUMBAR THREE-FOUR,LUMBAR FOUR-FIVE, LUMBAR FIVE-SACRAL ONE;  Surgeon: Charlie Pitter, MD;  Location: [place] [facility];  Service: Neurosurgery;  Laterality: Right;  right  . LUMBAR  LAMINECTOMY/DECOMPRESSION MICRODISCECTOMY Left 12/01/2014   Procedure: Left Lumbar Three-Four, Lumbar Four-Five Laminectomy and Foraminotomy;  Surgeon: Earnie Larsson, MD;  Location: [facility] [facility];  Service: Neurosurgery;  Laterality: Left;  . s/p left arm surgury after work accident  1991   "2000# steel fell on it"  . s/p right hand surgury for foreign object  1970's   "piece of wood went in my hand; had to get that out"    Social History:  reports that he quit smoking about 44 years ago. His smoking use included cigarettes. He has a 27.00 pack-year smoking history. He has quit using smokeless tobacco. He reports that he does not drink alcohol or use drugs.  Allergies  Allergen Reactions  . Crestor [Rosuvastatin Calcium] Other (See Comments)    Urination of blood  . Latex Rash and Other (See Comments)    Gloves cause welts on hands!!    Family History  Problem Relation Age of Onset  . Diabetes Mother   . Diabetes Sister   . Heart disease Sister        2 sister died with heart disease  . Coronary artery disease Other 80       male, first degree relative  . Diabetes Other        1st degree relative  . Heart disease Sister   . Lung cancer Sister        deceased    Prior to Admission medications   Medication Sig Start Date End Date Taking? Authorizing Provider  allopurinol (ZYLOPRIM) 300 MG tablet TAKE 1 TABLET BY MOUTH ONCE DAILY 11/08/17  Yes Biagio Borg, MD  atorvastatin (LIPITOR) 80 MG tablet Take 1 tablet (80 mg total) by mouth daily. 05/04/17  Yes Biagio Borg, MD  Blood Glucose Monitoring Suppl (ONE TOUCH ULTRA 2) w/Device KIT Use as directed 10/12/15  Yes Biagio Borg, MD  carvedilol (COREG) 25 MG tablet TAKE 1 TABLET BY MOUTH TWO  TIMES DAILY 12/11/17  Yes Biagio Borg, MD  Cholecalciferol (VITAMIN D) 1000 UNITS capsule Take 1,000 Units by mouth 2 (two) times daily. Reported on 02/15/2015 07/31/13  Yes [provider]  donepezil (ARICEPT) 5 MG tablet Take 1 tablet (5  mg total) by mouth daily. 06/26/17  Yes Biagio Borg, MD  fluticasone [facility]) 50 MCG/ACT nasal spray USE 2 SPRAYS IN [facility]  NOSTRIL EVERY DAY 11/23/16  Yes Biagio Borg, MD  furosemide (LASIX) 40 MG tablet Take 2 tablets (80 mg total) by mouth 2 (two) times daily. 06/19/17  Yes Oswald Hillock, MD  gabapentin (NEURONTIN) 300 MG capsule TAKE 2 CAPSULES BY MOUTH 3  TIMES DAILY 12/11/17  Yes Biagio Borg, MD  glipiZIDE (GLUCOTROL XL) 10 MG 24 hr tablet TAKE 1 TABLET BY MOUTH  DAILY WITH BREAKFAST 12/11/17  Yes Biagio Borg, MD  glucose blood (ONE TOUCH ULTRA TEST) test strip CHECK BLOOD SUGAR TWO TIMES DAILY 05/24/17  Yes Biagio Borg, MD  lisinopril (PRINIVIL,ZESTRIL) 2.5 MG tablet  TAKE 1 TABLET BY MOUTH  DAILY 11/08/17  Yes Biagio Borg, MD  metFORMIN (GLUCOPHAGE) 500 MG tablet TAKE 2 TABLETS BY MOUTH TWO TIMES DAILY 12/11/17  Yes Biagio Borg, MD  Multiple Vitamin (MULTIVITAMIN WITH MINERALS) TABS tablet Take 1 tablet by mouth every morning.   Yes [provider]  [facility] DELICA LANCETS 53M MISC USE AS DIRECTED THREE TIMES DAILY TO CHECK BLOOD SUGAR 05/24/17  Yes Biagio Borg, MD  oxybutynin (DITROPAN XL) 10 MG 24 hr tablet Take 1 tablet (10 mg total) by mouth at bedtime. 10/17/16  Yes Biagio Borg, MD  potassium chloride SA (K-DUR,KLOR-CON) 20 MEQ tablet TAKE 1 TABLET BY MOUTH  EVERY EVENING 12/11/17  Yes Biagio Borg, MD  sitaGLIPtin (JANUVIA) 100 MG tablet Take 1 tablet (100 mg total) by mouth daily. 05/25/17  Yes Biagio Borg, MD  tamsulosin (FLOMAX) 0.4 MG CAPS capsule TAKE 1 CAPSULE BY MOUTH  EVERY 12 HOURS 12/11/17  Yes Biagio Borg, MD  tiZANidine (ZANAFLEX) 4 MG tablet TAKE 1 TABLET BY MOUTH   EVERY 6 HOURS AS NEEDED FOR MUSCLE SPASMS. 09/20/17  Yes Biagio Borg, MD  warfarin (COUMADIN) 5 MG tablet Take 1/2 tablet daily except 1 tablet on Mondays, Wednesdays and Fridays Patient taking differently: Take 2.5-5 mg by mouth See admin instructions. Take 1/2 tablet daily except 1  tablet on Mondays, Wednesdays and Fridays 04/26/17  Yes Crenshaw, Denice Bors, MD  warfarin (COUMADIN) 5 MG tablet Take 1/2 to 1 tablet by mouth daily as directed by coumadin clinic 12/18/17  Yes Skeet Latch, MD  albuterol (PROVENTIL HFA;VENTOLIN HFA) 108 (90 Base) MCG/ACT inhaler Inhale 2 puffs into the lungs every 6 (six) hours as needed for wheezing or shortness of breath. 05/21/17   Biagio Borg, MD  HYDROcodone-acetaminophen (NORCO) 10-325 MG tablet Take 1 tablet by mouth daily as needed. 09/07/17   Biagio Borg, MD    Physical Exam: Vitals:   01/16/18 1330 01/16/18 1400 01/16/18 1430 01/16/18 1504  BP: 135/71 135/75 124/67 125/72  Pulse: 60 (!) 59 62 91  Resp: 17 (!) 21 (!) 22 17  Temp:      TempSrc:      SpO2: 91% 92% 97% 99%  Weight:      Height:        Constitutional: NAD, calm, comfortable Eyes: PERRL, lids and conjunctivae normal ENMT: Mucous membranes are moist. Posterior pharynx clear of any exudate or lesions.Normal dentition.  Neck: normal, supple, no masses, no thyromegaly Respiratory: Bilateral wheezing. Normal respiratory effort. No accessory muscle use.  Cardiovascular: Regular rate and rhythm, no murmurs / rubs / gallops.  1+ extremity edema. 2+ pedal pulses. No carotid bruits.  Abdomen: no tenderness, no masses palpated. No hepatosplenomegaly. Bowel sounds positive.  Musculoskeletal: no clubbing / cyanosis.  Tenderness to palpation over left knee with decreased range of motion. Normal muscle tone.  Skin: no rashes, lesions, ulcers. No induration Neurologic: CN 2-12 grossly intact. Sensation intact, DTR normal. Strength 5/5 in all 4.  Psychiatric: Normal judgment and insight. Alert and oriented x 3. Normal mood.    Labs on Admission: I have personally reviewed following labs and imaging studies  CBC: Recent Labs  Lab 01/16/18 1120  WBC 8.1  NEUTROABS 5.3  HGB 9.2*  HCT 31.5*  MCV 88.0  PLT 468   Basic Metabolic Panel: Recent Labs  Lab 01/16/18 1120    NA 138  K 4.0  CL 102  CO2 27  GLUCOSE 138*  BUN 29*  CREATININE 1.40*  CALCIUM 8.7*   GFR: Estimated Creatinine Clearance: 51.1 mL/min (A) (by C-G formula based on SCr of 1.4 mg/dL (H)). Liver Function Tests: Recent Labs  Lab 01/16/18 1120  AST 25  ALT 28  ALKPHOS 63  BILITOT 0.6  PROT 7.0  ALBUMIN 3.9   No results for input(s): LIPASE, AMYLASE in the last 168 hours. No results for input(s): AMMONIA in the last 168 hours. Coagulation Profile: Recent Labs  Lab 01/16/18 1120  INR 2.35   Cardiac Enzymes: Recent Labs  Lab 01/16/18 1120  TROPONINI 0.09*   BNP (last 3 results) Recent Labs    02/28/17 0848 03/16/17 0923  PROBNP 283.0* 164.0*   HbA1C: No results for input(s): HGBA1C in the last 72 hours. CBG: No results for input(s): GLUCAP in the last 168 hours. Lipid Profile: No results for input(s): CHOL, HDL, LDLCALC, TRIG, CHOLHDL, LDLDIRECT in the last 72 hours. Thyroid Function Tests: No results for input(s): TSH, T4TOTAL, FREET4, T3FREE, THYROIDAB in the last 72 hours. Anemia Panel: No results for input(s): VITAMINB12, FOLATE, FERRITIN, TIBC, IRON, RETICCTPCT in the last 72 hours. Urine analysis:    Component Value Date/Time   COLORURINE YELLOW 01/16/2018 1400   APPEARANCEUR CLEAR 01/16/2018 1400   LABSPEC 1.014 01/16/2018 1400   PHURINE 5.0 01/16/2018 1400   GLUCOSEU NEGATIVE 01/16/2018 1400   GLUCOSEU NEGATIVE 09/24/2017 1112   HGBUR NEGATIVE 01/16/2018 1400   BILIRUBINUR NEGATIVE 01/16/2018 1400   KETONESUR NEGATIVE 01/16/2018 1400   PROTEINUR NEGATIVE 01/16/2018 1400   UROBILINOGEN 0.2 09/24/2017 1112   NITRITE NEGATIVE 01/16/2018 1400   LEUKOCYTESUR NEGATIVE 01/16/2018 1400    Radiological Exams on Admission: Dg Chest 2 View  Result Date: 01/16/2018 CLINICAL DATA:  Cough and sob/left knee pain at patella/no known injury/htn/diabetic/hx pacemaker insertion/hx cabg x 2/ex smoker EXAM: CHEST - 2 VIEW COMPARISON:  09/24/2017 FINDINGS: Low  lung volumes with atelectasis or infiltrates in the lung bases. Can not exclude central pulmonary vascular congestion. Heart size upper limits normal. Stable left subclavian transvenous pacemaker. No effusion. Previous median sternotomy. Anterior vertebral endplate spurring at multiple levels in the mid and lower thoracic spine. IMPRESSION: Low volumes with bibasilar atelectasis or infiltrates. Electronically Signed   By: Lucrezia Europe M.D.   On: 01/16/2018 13:28   US Venous Img Lower Unilateral Left  Result Date: 01/16/2018 CLINICAL DATA:  Pain x2 weeks EXAM: LEFT LOWER EXTREMITY VENOUS DOPPLER ULTRASOUND TECHNIQUE: Gray-scale sonography with compression, as well as color and duplex ultrasound, were performed to evaluate the deep venous system from the level of the common femoral vein through the popliteal and proximal calf veins. COMPARISON:  03/16/2017 FINDINGS: Normal compressibility of the common femoral, superficial femoral, and popliteal veins, as well as the proximal calf veins. No filling defects to suggest DVT on grayscale or color Doppler imaging. Doppler waveforms show normal direction of venous flow, normal respiratory phasicity and response to augmentation. Visualized segments of the saphenous venous system normal in caliber and compressibility. Survey views of the contralateral common femoral vein are unremarkable. IMPRESSION: No femoropopliteal and no calf DVT in the visualized calf veins. If clinical symptoms are inconsistent or if there are persistent or worsening symptoms, further imaging (possibly involving the iliac veins) may be warranted. Electronically Signed   By: Lucrezia Europe M.D.   On: 01/16/2018 12:48   Dg Knee Complete 4 Views Left  Result Date: 01/16/2018 CLINICAL DATA:  Cough and sob/left knee pain at patella/no known injury/htn/diabetic/hx  pacemaker insertion/hx cabg x 2/ex smoker EXAM: LEFT KNEE - COMPLETE 4+ VIEW COMPARISON:  06/28/2016 FINDINGS: Chondrocalcinosis most  conspicuous in the lateral compartment. Cortical irregularity along the lateral femoral condyle suggesting pes anserinus avulsion. No other fracture or dislocation. No effusion. Small marginal spur from the patellar articular surface. Patchy arterial vascular calcifications. IMPRESSION: Cortical irregularity along the lateral femoral condyle suggesting avulsion injury. If symptoms persist, MR may be useful for further characterization. Electronically Signed   By: Lucrezia Europe M.D.   On: 01/16/2018 13:59    EKG: Independently reviewed.  Atrial flutter, ventricular placed  Assessment/Plan Active Problems:   Type 2 diabetes, uncontrolled, with neuropathy (HCC)   Hyperlipidemia   Essential hypertension   DIASTOLIC HEART FAILURE, CHRONIC   PACEMAKER, PERMANENT   Acute bronchitis   Atrial fibrillation ([place])   Long term current use of anticoagulant therapy   Left knee pain   Acute respiratory failure with hypoxia (HCC)   Elevated troponin     1. Acute respiratory failure with hypoxia.  Suspect is related to bronchitis.  We will try and wean off oxygen as tolerated. 2. Acute bronchitis versus early pneumonia.  Continue on IV antibiotics for now.  Since he is wheezing, will continue on bronchodilator therapy and add intravenous steroids.  Continue mucolytics. 3. Atrial fibrillation.  Currently heart rate is stable.  He is anticoagulated with Coumadin. 4. Left knee pain.  Venous Dopplers are negative for DVT.  There is questionable avulsion injury.  Patient has seen Dr. Lorin Mercy with [facility] orthopedics in the past.  Case was reviewed with Dr. Louanne Skye on-call for [facility] orthopedics.  Recommendations were to place patient in knee immobilizer and have him follow-up as an outpatient.  Will request physical therapy evaluation. 5. Elevated troponin.  Suspect this is demand ischemia in the setting of acute respiratory illness.  No acute EKG changes or complaints of chest pain.  Continue to monitor.   6. Acute  kidney injury.  Mild elevation of creatinine possibly related to dehydration.  Was provide gentle hydration overnight and recheck in a.m. 7. Diabetes.  Continue on glipizide and Januvia.  Start on sliding scale insulin.  Will hold metformin for now. 8. Hyperlipidemia.  Continue statin 9. Chronic diastolic CHF.  Appears compensated at this time.  Hold Lasix and lisinopril in the setting of elevated creatinine.  Can likely resume on discharge.  DVT prophylaxis: Coumadin Code Status: DNR Family Communication: Try to call wife at home, no answer Disposition Plan: Pending hospital course anticipate discharge home once respiratory status has improved Consults called:   Admission status: Observation, telemetry  Kathie Dike MD [facility] Pager [phone]  If 7PM-7AM, please contact night-coverage [email or web address] Password [email or web address]  01/16/2018, 4:04 PM

## 2018-01-16 NOTE — Progress Notes (Signed)
ANTICOAGULATION CONSULT NOTE - Initial Consult  Pharmacy Consult for warfarin Indication: atrial fibrillation  Allergies  Allergen Reactions  . Crestor [Rosuvastatin Calcium] Other (See Comments)    Urination of blood  . Latex Rash and Other (See Comments)    Gloves cause welts on hands!!    Patient Measurements: Height: '5\' 11"'  (180.3 cm) Weight: 203 lb 14.8 oz (92.5 kg) IBW/kg (Calculated) : 75.3   Vital Signs: Temp: 98.8 F (37.1 C) (12/18 1643) Temp Source: Oral (12/18 1643) BP: 150/90 (12/18 1643) Pulse Rate: 60 (12/18 1643)  Labs: Recent Labs    01/16/18 1120  HGB 9.2*  HCT 31.5*  PLT 151  LABPROT 25.4*  INR 2.35  CREATININE 1.40*  TROPONINI 0.09*    Estimated Creatinine Clearance: 56.3 mL/min (A) (by C-G formula based on SCr of 1.4 mg/dL (H)).   Medical History: Past Medical History:  Diagnosis Date  . Aortic root dilatation ([place]) 02/02/2011  . Arthritis    "lower back; going back down both my sciatic nerves"  . 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 BENIGN PROSTATIC HYPERTROPHY 08/29/2006   takes Flomax daily  . 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 Cataracts, bilateral    immature  . CHF (congestive heart failure) (HCC)    takes Lasix daily  . Chronic back pain    HNP   . CORONARY ARTERY DISEASE 08/29/2006   takes Coumadin daily  . DEPRESSION 08/29/2006  . DIABETES MELLITUS, TYPE II 08/29/2006   takes Metformin,Januvia,and Glipizide  daily  . DIASTOLIC HEART FAILURE, CHRONIC 06/09/2008  . GOUT 04/22/2007   takes Allopurinol daily  . History of migraine    75yr ago  . HYPERLIPIDEMIA 08/29/2006   takes Atorvastatin daily  . HYPERTENSION 08/29/2006   takes Lisinopril daily  . INSOMNIA-SLEEP DISORDER-UNSPEC 10/23/2007  . Left lumbar radiculopathy 05/30/2010  . LUMBAR RADICULOPATHY, RIGHT 06/10/2007  . Muscle spasm    takes Zanaflex daily  . [place]   "I've had several MIs"  . NEOPLASM, MALIGNANT,  PROSTATE 11/26/2006  . PACEMAKER, PERMANENT 03/04/2008   pt denies this date  . Peripheral neuropathy    takes Gabapentin daily  . Presence of permanent cardiac pacemaker   . Shortness of breath dyspnea    "all my life" with exertion  . Sleep apnea    "if I lay flat I quit breathing; HOB up I'm fine"    Medications:  Medications Prior to Admission  Medication Sig Dispense Refill Last Dose  . allopurinol (ZYLOPRIM) 300 MG tablet TAKE 1 TABLET BY MOUTH ONCE DAILY 90 tablet 1 Taking  . atorvastatin (LIPITOR) 80 MG tablet Take 1 tablet (80 mg total) by mouth daily. 540 tablet 3 01/15/2018 at Unknown time  . Blood Glucose Monitoring Suppl (ONE TOUCH ULTRA 2) w/Device KIT Use as directed 1 each 3 01/15/2018 at Unknown time  . carvedilol (COREG) 25 MG tablet TAKE 1 TABLET BY MOUTH TWO  TIMES DAILY 180 tablet 2 01/15/2018 at 1900  . Cholecalciferol (VITAMIN D) 1000 UNITS capsule Take 1,000 Units by mouth 2 (two) times daily. Reported on 02/15/2015   Past Month at Unknown time  . donepezil (ARICEPT) 5 MG tablet Take 1 tablet (5 mg total) by mouth daily. 90 tablet 3 01/15/2018 at Unknown time  . fluticasone (FLONASE) 50 MCG/ACT nasal spray USE 2 SPRAYS IN EACH  NOSTRIL EVERY DAY 48 g 0 01/15/2018 at Unknown time  . furosemide (LASIX) 40 MG tablet Take 2 tablets (80 mg total)  by mouth 2 (two) times daily. 540 tablet 2 01/15/2018 at Unknown time  . gabapentin (NEURONTIN) 300 MG capsule TAKE 2 CAPSULES BY MOUTH 3  TIMES DAILY 540 capsule 3 01/15/2018 at Unknown time  . glipiZIDE (GLUCOTROL XL) 10 MG 24 hr tablet TAKE 1 TABLET BY MOUTH  DAILY WITH BREAKFAST 90 tablet 2 01/15/2018 at Unknown time  . glucose blood (ONE TOUCH ULTRA TEST) test strip CHECK BLOOD SUGAR TWO TIMES DAILY 100 each 3 01/15/2018 at Unknown time  . lisinopril (PRINIVIL,ZESTRIL) 2.5 MG tablet TAKE 1 TABLET BY MOUTH  DAILY 90 tablet 1 01/15/2018 at Unknown time  . metFORMIN (GLUCOPHAGE) 500 MG tablet TAKE 2 TABLETS BY MOUTH TWO TIMES  DAILY 360 tablet 2 01/15/2018 at Unknown time  . Multiple Vitamin (MULTIVITAMIN WITH MINERALS) TABS tablet Take 1 tablet by mouth every morning.   01/15/2018 at Unknown time  . ONETOUCH DELICA LANCETS 79K MISC USE AS DIRECTED THREE TIMES DAILY TO CHECK BLOOD SUGAR 300 each 0 01/15/2018 at Unknown time  . oxybutynin (DITROPAN XL) 10 MG 24 hr tablet Take 1 tablet (10 mg total) by mouth at bedtime. 90 tablet 3 01/15/2018 at Unknown time  . potassium chloride SA (K-DUR,KLOR-CON) 20 MEQ tablet TAKE 1 TABLET BY MOUTH  EVERY EVENING 90 tablet 2 01/15/2018 at Unknown time  . sitaGLIPtin (JANUVIA) 100 MG tablet Take 1 tablet (100 mg total) by mouth daily. 90 tablet 3 01/15/2018 at Unknown time  . tamsulosin (FLOMAX) 0.4 MG CAPS capsule TAKE 1 CAPSULE BY MOUTH  EVERY 12 HOURS 180 capsule 2 01/15/2018 at Unknown time  . tiZANidine (ZANAFLEX) 4 MG tablet TAKE 1 TABLET BY MOUTH   EVERY 6 HOURS AS NEEDED FOR MUSCLE SPASMS. 180 tablet 2 01/15/2018 at Unknown time  . warfarin (COUMADIN) 5 MG tablet Take 1/2 tablet daily except 1 tablet on Mondays, Wednesdays and Fridays (Patient taking differently: Take 2.5-5 mg by mouth See admin instructions. Take 1/2 tablet daily except 1 tablet on Mondays, Wednesdays and Fridays) 90 tablet 3 Past Week at 1800  . warfarin (COUMADIN) 5 MG tablet Take 1/2 to 1 tablet by mouth daily as directed by coumadin clinic 90 tablet 1 01/15/2018 at 1800  . albuterol (PROVENTIL HFA;VENTOLIN HFA) 108 (90 Base) MCG/ACT inhaler Inhale 2 puffs into the lungs every 6 (six) hours as needed for wheezing or shortness of breath. 1 Inhaler 2 unknown  . HYDROcodone-acetaminophen (NORCO) 10-325 MG tablet Take 1 tablet by mouth daily as needed. 30 tablet 0 unknown    Assessment: Pharmacy consulted to dose warfarin in patient with atrial fibrillation.   Patient's INR on admission is therapeutic at 2.35.  Home dose of warfarin is 5 mg MWF and 2.5 mg ROW.  Last dose given 12/17.  Goal of Therapy:  INR  2-3 Monitor platelets by anticoagulation protocol: Yes   Plan:  Warfarin 5 mg x 1 dose. Monitor daily INR and s/s of bleeding  Ramond Craver 01/16/2018,4:48 PM

## 2018-01-16 NOTE — ED Notes (Signed)
MD Lita Mains aware pt's RA sats had dropped to 83% and temp has risen to 100.2 orally. No new orders at this time.

## 2018-01-16 NOTE — ED Notes (Signed)
Pt returned at this time from x-ray, respiratory notified.

## 2018-01-16 NOTE — ED Notes (Signed)
Respiratory paged at this time for tx.  

## 2018-01-16 NOTE — ED Provider Notes (Signed)
[facility] EMERGENCY DEPARTMENT Provider Note   CSN: [id number] Arrival date & time: 01/16/18  1000     History   Chief Complaint Chief Complaint  Patient presents with  . URI    HPI [name] is a 72 y.o. male.  HPI Patient presents with 1 week of nonproductive cough and shortness of breath.  He is also had some nasal congestion.  He also complains of right leg pain for roughly the same period of time.  No known injuries.  He is complaining of pain to the knee and calf area.  No fever or chills.  No chest pain. Past Medical History:  Diagnosis Date  . Aortic root dilatation ([place]) 02/02/2011  . Arthritis    "lower back; going back down both my sciatic nerves"  . 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 BENIGN PROSTATIC HYPERTROPHY 08/29/2006   takes Flomax daily  . 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 Cataracts, bilateral    immature  . CHF (congestive heart failure) (HCC)    takes Lasix daily  . Chronic back pain    HNP   . CORONARY ARTERY DISEASE 08/29/2006   takes Coumadin daily  . DEPRESSION 08/29/2006  . DIABETES MELLITUS, TYPE II 08/29/2006   takes Metformin,Januvia,and Glipizide  daily  . DIASTOLIC HEART FAILURE, CHRONIC 06/09/2008  . GOUT 04/22/2007   takes Allopurinol daily  . History of migraine    63yr ago  . HYPERLIPIDEMIA 08/29/2006   takes Atorvastatin daily  . HYPERTENSION 08/29/2006   takes Lisinopril daily  . INSOMNIA-SLEEP DISORDER-UNSPEC 10/23/2007  . Left lumbar radiculopathy 05/30/2010  . LUMBAR RADICULOPATHY, RIGHT 06/10/2007  . Muscle spasm    takes Zanaflex daily  . [place]/27/10   "I've had several MIs"  . 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 PACEMAKER, PERMANENT 03/04/2008   pt denies this date  . Peripheral neuropathy    takes Gabapentin daily  . Presence of permanent cardiac pacemaker   . Shortness of breath dyspnea    "all my life" with exertion  . Sleep apnea    "if I lay flat I quit  breathing; HOB up I'm fine"    Patient Active Problem List   Diagnosis Date Noted  . Weakness 12/21/2017  . Hypersomnolence 09/20/2017  . Acute renal failure superimposed on stage 2 chronic kidney disease ([place] 06/16/2017  . Left cervical radiculopathy 02/07/2017  . Trochanteric bursitis, right hip 01/01/2017  . Right hip pain 12/26/2016  . Knee instability, right 11/03/2016  . Urinary frequency 10/20/2016  . Right foot pain 09/29/2016  . Prepatellar bursitis 06/29/2016  . Left knee pain 06/28/2016  . Ingrown nail 06/28/2016  . Acute gouty arthritis 05/07/2016  . Left shoulder pain 02/09/2016  . Memory loss 01/19/2016  . Ingrown toenail 09/13/2015  . Renal insufficiency 09/08/2015  . Right leg swelling 07/13/2015  . Leg wound, right 06/15/2015  . Rash 06/06/2015  . Anemia 05/24/2015  . Cellulitis of leg, right 05/13/2015  . Greater trochanteric bursitis of left hip 08/12/2014  . Left leg pain 07/15/2014  . 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 Lumbar stenosis with neurogenic claudication 03/23/2014  . Long term current use of anticoagulant therapy 02/27/2014  . Dysphagia, pharyngoesophageal phase 12/01/2013  . 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 Dysphagia 11/11/2013  . Lumbago 11/03/2013  . Low back pain radiating to right leg 11/03/2013  . Abnormality of gait 11/03/2013  . Difficulty walking 11/03/2013  .  Loss of weight 10/29/2013  . Atrial fibrillation ([place]) 08/26/2013  . Chest pain 04/05/2013  . Plantar fasciitis of right foot 02/18/2013  . Bruit 08/21/2012  . Sciatica of left side 08/15/2012  . Left hip pain 08/15/2012  . Headache(784.0) 05/03/2012  . Increased prostate specific antigen (PSA) velocity 11/11/2011  . Right sided sciatica 11/10/2011  . Chronic low back pain 05/03/2011  . Aortic root dilatation ([place]) 02/02/2011  . Wheezing without diagnosis of asthma 02/02/2011  . Syncope 12/27/2010  . Left  lumbar radiculopathy 05/30/2010  . Wellness examination 05/30/2010  . 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 DOE (dyspnea on exertion) 06/10/2008  . DIASTOLIC HEART FAILURE, CHRONIC 06/09/2008  . KNEE PAIN, BILATERAL 04/21/2008  . LEG PAIN, BILATERAL 04/21/2008  . 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 PACEMAKER, PERMANENT 03/04/2008  . INSOMNIA-SLEEP DISORDER-UNSPEC 10/23/2007  . Lumbar radiculopathy, chronic 06/10/2007  . GOUT 04/22/2007  . 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 ERECTILE DYSFUNCTION 11/26/2006  . ALLERGIC RHINITIS 11/26/2006  . Type 2 diabetes, uncontrolled, with neuropathy ([place]) 08/29/2006  . Hyperlipidemia 08/29/2006  . Overweight(278.02) 08/29/2006  . DEPRESSION 08/29/2006  . Essential hypertension 08/29/2006  . CORONARY ARTERY DISEASE 08/29/2006  . BENIGN PROSTATIC HYPERTROPHY 08/29/2006    Past Surgical History:  Procedure Laterality Date  . COLONOSCOPY    . CORONARY ANGIOPLASTY WITH STENT PLACEMENT  12/27/10   "I've had a total of 9 cardiac stents put in"  . CORONARY ARTERY BYPASS GRAFT  1992   CABG X 2  . ESOPHAGOGASTRODUODENOSCOPY N/A 12/01/2013   Procedure: ESOPHAGOGASTRODUODENOSCOPY (EGD);  Surgeon: Lafayette Dragon, MD;  Location: [facility];  Service: Endoscopy;  Laterality: N/A;  . INSERT / REPLACE / REMOVE PACEMAKER  ~ 2004   initial pacemaker placement  . INSERT / REPLACE / REMOVE PACEMAKER  10/2009   generator change  . LUMBAR LAMINECTOMY/DECOMPRESSION MICRODISCECTOMY Right 03/23/2014   Procedure: LAMINECTOMY AND FORAMINOTOMY RIGHT LUMBAR THREE-FOUR,LUMBAR FOUR-FIVE, LUMBAR FIVE-SACRAL ONE;  Surgeon: Charlie Pitter, MD;  Location: [place];  Service: Neurosurgery;  Laterality: Right;  right  . LUMBAR LAMINECTOMY/DECOMPRESSION MICRODISCECTOMY Left 12/01/2014   Procedure: Left Lumbar Three-Four, Lumbar Four-Five Laminectomy and Foraminotomy;  Surgeon: Earnie Larsson, MD;  Location: [facility];  Service: Neurosurgery;   Laterality: Left;  . s/p left arm surgury after work accident  1991   "2000# steel fell on it"  . s/p right hand surgury for foreign object  1970's   "piece of wood went in my hand; had to get that out"        Home Medications    Prior to Admission medications   Medication Sig Start Date End Date Taking? Authorizing Provider  allopurinol (ZYLOPRIM) 300 MG tablet TAKE 1 TABLET BY MOUTH ONCE DAILY 11/08/17  Yes Biagio Borg, MD  atorvastatin (LIPITOR) 80 MG tablet Take 1 tablet (80 mg total) by mouth daily. 05/04/17  Yes Biagio Borg, MD  Blood Glucose Monitoring Suppl (ONE TOUCH ULTRA 2) w/Device KIT Use as directed 10/12/15  Yes Biagio Borg, MD  carvedilol (COREG) 25 MG tablet TAKE 1 TABLET BY MOUTH TWO  TIMES DAILY 12/11/17  Yes Biagio Borg, MD  Cholecalciferol (VITAMIN D) 1000 UNITS capsule Take 1,000 Units by mouth 2 (two) times daily. Reported on 02/15/2015 07/31/13  Yes [provider]  donepezil (ARICEPT) 5 MG tablet Take 1 tablet (5 mg total) by mouth daily. 06/26/17  Yes Biagio Borg, MD  fluticasone [facility]) 50 MCG/ACT nasal spray  USE 2 SPRAYS IN EACH  NOSTRIL EVERY DAY 11/23/16  Yes Biagio Borg, MD  furosemide (LASIX) 40 MG tablet Take 2 tablets (80 mg total) by mouth 2 (two) times daily. 06/19/17  Yes Oswald Hillock, MD  gabapentin (NEURONTIN) 300 MG capsule TAKE 2 CAPSULES BY MOUTH 3  TIMES DAILY 12/11/17  Yes Biagio Borg, MD  glipiZIDE (GLUCOTROL XL) 10 MG 24 hr tablet TAKE 1 TABLET BY MOUTH  DAILY WITH BREAKFAST 12/11/17  Yes Biagio Borg, MD  glucose blood (ONE TOUCH ULTRA TEST) test strip CHECK BLOOD SUGAR TWO TIMES DAILY 05/24/17  Yes Biagio Borg, MD  lisinopril (PRINIVIL,ZESTRIL) 2.5 MG tablet TAKE 1 TABLET BY MOUTH  DAILY 11/08/17  Yes Biagio Borg, MD  metFORMIN (GLUCOPHAGE) 500 MG tablet TAKE 2 TABLETS BY MOUTH TWO TIMES DAILY 12/11/17  Yes Biagio Borg, MD  Multiple Vitamin (MULTIVITAMIN WITH MINERALS) TABS tablet Take 1 tablet by mouth every morning.    Yes [provider]  [facility] DELICA LANCETS 67H MISC USE AS DIRECTED THREE TIMES DAILY TO CHECK BLOOD SUGAR 05/24/17  Yes Biagio Borg, MD  oxybutynin (DITROPAN XL) 10 MG 24 hr tablet Take 1 tablet (10 mg total) by mouth at bedtime. 10/17/16  Yes Biagio Borg, MD  potassium chloride SA (K-DUR,KLOR-CON) 20 MEQ tablet TAKE 1 TABLET BY MOUTH  EVERY EVENING 12/11/17  Yes Biagio Borg, MD  sitaGLIPtin (JANUVIA) 100 MG tablet Take 1 tablet (100 mg total) by mouth daily. 05/25/17  Yes Biagio Borg, MD  tamsulosin (FLOMAX) 0.4 MG CAPS capsule TAKE 1 CAPSULE BY MOUTH  EVERY 12 HOURS 12/11/17  Yes Biagio Borg, MD  tiZANidine (ZANAFLEX) 4 MG tablet TAKE 1 TABLET BY MOUTH   EVERY 6 HOURS AS NEEDED FOR MUSCLE SPASMS. 09/20/17  Yes Biagio Borg, MD  warfarin (COUMADIN) 5 MG tablet Take 1/2 tablet daily except 1 tablet on Mondays, Wednesdays and Fridays Patient taking differently: Take 2.5-5 mg by mouth See admin instructions. Take 1/2 tablet daily except 1 tablet on Mondays, Wednesdays and Fridays 04/26/17  Yes Crenshaw, Denice Bors, MD  warfarin (COUMADIN) 5 MG tablet Take 1/2 to 1 tablet by mouth daily as directed by coumadin clinic 12/18/17  Yes Skeet Latch, MD  albuterol (PROVENTIL HFA;VENTOLIN HFA) 108 (90 Base) MCG/ACT inhaler Inhale 2 puffs into the lungs every 6 (six) hours as needed for wheezing or shortness of breath. 05/21/17   Biagio Borg, MD  HYDROcodone-acetaminophen (NORCO) 10-325 MG tablet Take 1 tablet by mouth daily as needed. 09/07/17   Biagio Borg, MD    Family History Family History  Problem Relation Age of Onset  . Diabetes Mother   . Diabetes Sister   . Heart disease Sister        2 sister died with heart disease  . Coronary artery disease Other 53       male, first degree relative  . Diabetes Other        1st degree relative  . Heart disease Sister   . Lung cancer Sister        deceased    Social History Social History   Tobacco Use  . Smoking status:  Former Smoker    Packs/day: 3.00    Years: 9.00    Pack years: 27.00    Types: Cigarettes    Last attempt to quit: 02/26/1973    Years since quitting: 44.9  . Smokeless tobacco: Former Systems developer  .  Tobacco comment: quit smoking 35yr ago  Substance Use Topics  . Alcohol use: No    Alcohol/week: 0.0 standard drinks  . Drug use: No    Comment: "used pouches of tobacco for a long time; quit those 12/09/1970"     Allergies   Crestor [rosuvastatin calcium] and Latex   Review of Systems Review of Systems  Constitutional: Positive for fatigue. Negative for chills and fever.  HENT: Positive for congestion. Negative for sore throat and trouble swallowing.   Eyes: Negative for visual disturbance.  Respiratory: Positive for cough, shortness of breath and wheezing.   Cardiovascular: Negative for chest pain, palpitations and leg swelling.  Gastrointestinal: Negative for abdominal pain, constipation, diarrhea, nausea and vomiting.  Genitourinary: Negative for dysuria, flank pain and frequency.  Musculoskeletal: Positive for arthralgias and myalgias. Negative for back pain, neck pain and neck stiffness.  Skin: Negative for rash and wound.  Neurological: Negative for dizziness, weakness, light-headedness, numbness and headaches.  All other systems reviewed and are negative.    Physical Exam Updated Vital Signs BP 135/71   Pulse 60   Temp 100.2 F (37.9 C) (Oral)   Resp 17   Ht _0  (1.676 m)   Wt 90.7 kg   SpO2 91%   BMI 32.28 kg/m   Physical Exam Vitals signs and nursing note reviewed.  Constitutional:      General: He is not in acute distress.    Appearance: Normal appearance. He is well-developed. He is not ill-appearing.  HENT:     Head: Normocephalic and atraumatic.     Nose: Nose normal.     Mouth/Throat:     Mouth: Mucous membranes are moist.  Eyes:     Pupils: Pupils are equal, round, and reactive to light.  Neck:     Musculoskeletal: Normal range of motion and neck  supple. No neck rigidity or muscular tenderness.     Vascular: No carotid bruit.  Cardiovascular:     Rate and Rhythm: Normal rate and regular rhythm.  Pulmonary:     Effort: Pulmonary effort is normal.     Breath sounds: Wheezing present.     Comments: Expiratory wheezing throughout with diminished breath sounds bilateral bases. Abdominal:     General: Bowel sounds are normal.     Palpations: Abdomen is soft.     Tenderness: There is no abdominal tenderness. There is no guarding or rebound.  Musculoskeletal: Normal range of motion.        General: No tenderness.     Comments: Tenderness to palpation over the left knee and left calf.  Pain with range of motion of the left knee.  No obvious effusions or deformity.  Distal pulses are 2+.  No midline thoracic or lumbar tenderness.  Lymphadenopathy:     Cervical: No cervical adenopathy.  Skin:    General: Skin is warm and dry.     Findings: No erythema or rash.  Neurological:     General: No focal deficit present.     Mental Status: He is alert and oriented to person, place, and time.     Comments: Appears to move all extremities without focal deficit though some limitation of movement of the left leg due to pain.  Sensation is grossly intact.  Psychiatric:        Mood and Affect: Mood normal.        Behavior: Behavior normal.      ED Treatments / Results  Labs (all labs ordered are listed, but  only abnormal results are displayed) Labs Reviewed  CBC WITH DIFFERENTIAL/PLATELET - Abnormal; Notable for the following components:      Result Value   RBC 3.58 (*)    Hemoglobin 9.2 (*)    HCT 31.5 (*)    MCH 25.7 (*)    MCHC 29.2 (*)    RDW 16.8 (*)    All other components within normal limits  COMPREHENSIVE METABOLIC PANEL - Abnormal; Notable for the following components:   Glucose, Bld 138 (*)    BUN 29 (*)    Creatinine, Ser 1.40 (*)    Calcium 8.7 (*)    GFR calc non Af Amer 50 (*)    GFR calc Af Amer 58 (*)    All other  components within normal limits  BRAIN NATRIURETIC PEPTIDE - Abnormal; Notable for the following components:   B Natriuretic Peptide 332.0 (*)    All other components within normal limits  PROTIME-INR - Abnormal; Notable for the following components:   Prothrombin Time 25.4 (*)    All other components within normal limits  TROPONIN I - Abnormal; Notable for the following components:   Troponin I 0.09 (*)    All other components within normal limits  URINALYSIS, ROUTINE W REFLEX MICROSCOPIC  INFLUENZA PANEL BY PCR (TYPE A & B)    EKG None  Radiology Dg Chest 2 View  Result Date: 01/16/2018 CLINICAL DATA:  Cough and sob/left knee pain at patella/no known injury/htn/diabetic/hx pacemaker insertion/hx cabg x 2/ex smoker EXAM: CHEST - 2 VIEW COMPARISON:  09/24/2017 FINDINGS: Low lung volumes with atelectasis or infiltrates in the lung bases. Can not exclude central pulmonary vascular congestion. Heart size upper limits normal. Stable left subclavian transvenous pacemaker. No effusion. Previous median sternotomy. Anterior vertebral endplate spurring at multiple levels in the mid and lower thoracic spine. IMPRESSION: Low volumes with bibasilar atelectasis or infiltrates. Electronically Signed   By: Lucrezia Europe M.D.   On: 01/16/2018 13:28   US Venous Img Lower Unilateral Left  Result Date: 01/16/2018 CLINICAL DATA:  Pain x2 weeks EXAM: LEFT LOWER EXTREMITY VENOUS DOPPLER ULTRASOUND TECHNIQUE: Gray-scale sonography with compression, as well as color and duplex ultrasound, were performed to evaluate the deep venous system from the level of the common femoral vein through the popliteal and proximal calf veins. COMPARISON:  03/16/2017 FINDINGS: Normal compressibility of the common femoral, superficial femoral, and popliteal veins, as well as the proximal calf veins. No filling defects to suggest DVT on grayscale or color Doppler imaging. Doppler waveforms show normal direction of venous flow, normal  respiratory phasicity and response to augmentation. Visualized segments of the saphenous venous system normal in caliber and compressibility. Survey views of the contralateral common femoral vein are unremarkable. IMPRESSION: No femoropopliteal and no calf DVT in the visualized calf veins. If clinical symptoms are inconsistent or if there are persistent or worsening symptoms, further imaging (possibly involving the iliac veins) may be warranted. Electronically Signed   By: Lucrezia Europe M.D.   On: 01/16/2018 12:48   Dg Knee Complete 4 Views Left  Result Date: 01/16/2018 CLINICAL DATA:  Cough and sob/left knee pain at patella/no known injury/htn/diabetic/hx pacemaker insertion/hx cabg x 2/ex smoker EXAM: LEFT KNEE - COMPLETE 4+ VIEW COMPARISON:  06/28/2016 FINDINGS: Chondrocalcinosis most conspicuous in the lateral compartment. Cortical irregularity along the lateral femoral condyle suggesting pes anserinus avulsion. No other fracture or dislocation. No effusion. Small marginal spur from the patellar articular surface. Patchy arterial vascular calcifications. IMPRESSION: Cortical irregularity along the  lateral femoral condyle suggesting avulsion injury. If symptoms persist, MR may be useful for further characterization. Electronically Signed   By: Lucrezia Europe M.D.   On: 01/16/2018 13:59    Procedures Procedures (including critical care time)  Medications Ordered in ED Medications  cefTRIAXone (ROCEPHIN) 1 g in sodium chloride 0.9 % 100 mL IVPB (1 g Intravenous New Bag/Given 01/16/18 1345)  azithromycin (ZITHROMAX) 500 mg in sodium chloride 0.9 % 250 mL IVPB (has no administration in time range)  albuterol (PROVENTIL) (2.5 MG/3ML) 0.083% nebulizer solution 5 mg (5 mg Nebulization Given 01/16/18 1245)     Initial Impression / Assessment and Plan / ED Course  I have reviewed the triage vital signs and the nursing notes.  Pertinent labs & imaging results that were available during my care of the patient  were reviewed by me and considered in my medical decision making (see chart for details).    Patient requiring supplemental oxygen to maintain saturations in the 90s.  Atelectasis versus infiltrates bilateral bases on chest x-ray.  Patient does have a mild elevation in creatinine and troponin.  EKG demonstrates a paced rhythm.  Initiated antibiotics and breathing treatment.  Discussed with hospitalist who will see patient in the emergency department and admit.   Final Clinical Impressions(s) / ED Diagnoses   Final diagnoses:  Community acquired pneumonia, unspecified laterality  Left leg pain    ED Discharge Orders    None       Julianne Rice, MD 01/16/18 1408

## 2018-01-16 NOTE — ED Notes (Signed)
Date and time results received: 01/16/18 1:36 PM  Test: troponin Critical Value: 0.09  Name of Provider Notified: Lita Mains  Orders Received? Or Actions Taken?:no new orders at this time.

## 2018-01-17 DIAGNOSIS — I5032 Chronic diastolic (congestive) heart failure: Secondary | ICD-10-CM | POA: Diagnosis not present

## 2018-01-17 DIAGNOSIS — J189 Pneumonia, unspecified organism: Secondary | ICD-10-CM | POA: Diagnosis not present

## 2018-01-17 DIAGNOSIS — J9601 Acute respiratory failure with hypoxia: Secondary | ICD-10-CM | POA: Diagnosis not present

## 2018-01-17 DIAGNOSIS — M25562 Pain in left knee: Secondary | ICD-10-CM

## 2018-01-17 DIAGNOSIS — J209 Acute bronchitis, unspecified: Secondary | ICD-10-CM | POA: Diagnosis not present

## 2018-01-17 DIAGNOSIS — I4891 Unspecified atrial fibrillation: Secondary | ICD-10-CM | POA: Diagnosis not present

## 2018-01-17 LAB — CBC
HCT: 29.2 % — ABNORMAL LOW (ref 39.0–52.0)
Hemoglobin: 8.4 g/dL — ABNORMAL LOW (ref 13.0–17.0)
MCH: 25.1 pg — AB (ref 26.0–34.0)
MCHC: 28.8 g/dL — ABNORMAL LOW (ref 30.0–36.0)
MCV: 87.2 fL (ref 80.0–100.0)
Platelets: 132 10*3/uL — ABNORMAL LOW (ref 150–400)
RBC: 3.35 MIL/uL — AB (ref 4.22–5.81)
RDW: 16.5 % — ABNORMAL HIGH (ref 11.5–15.5)
WBC: 4.6 10*3/uL (ref 4.0–10.5)
nRBC: 0 % (ref 0.0–0.2)

## 2018-01-17 LAB — COMPREHENSIVE METABOLIC PANEL
ALBUMIN: 3.4 g/dL — AB (ref 3.5–5.0)
ALT: 24 U/L (ref 0–44)
AST: 20 U/L (ref 15–41)
Alkaline Phosphatase: 56 U/L (ref 38–126)
Anion gap: 7 (ref 5–15)
BUN: 26 mg/dL — ABNORMAL HIGH (ref 8–23)
CO2: 27 mmol/L (ref 22–32)
Calcium: 8.6 mg/dL — ABNORMAL LOW (ref 8.9–10.3)
Chloride: 104 mmol/L (ref 98–111)
Creatinine, Ser: 0.84 mg/dL (ref 0.61–1.24)
GFR calc Af Amer: >60 mL/min (ref >60)
GFR calc non Af Amer: >60 mL/min (ref >60)
Glucose, Bld: 243 mg/dL — ABNORMAL HIGH (ref 70–99)
Potassium: 4 mmol/L (ref 3.5–5.1)
Sodium: 138 mmol/L (ref 135–145)
Total Bilirubin: 0.6 mg/dL (ref 0.3–1.2)
Total Protein: 6.4 g/dL — ABNORMAL LOW (ref 6.5–8.1)

## 2018-01-17 LAB — GLUCOSE, CAPILLARY
GLUCOSE-CAPILLARY: 285 mg/dL — AB (ref 70–99)
Glucose-Capillary: 194 mg/dL — ABNORMAL HIGH (ref 70–99)

## 2018-01-17 LAB — TROPONIN I
Troponin I: 0.06 ng/mL (ref <0.03)
Troponin I: 0.04 ng/mL (ref <0.03)

## 2018-01-17 LAB — PROTIME-INR
INR: 2.53
Prothrombin Time: 26.9 seconds — ABNORMAL HIGH (ref 11.4–15.2)

## 2018-01-17 MED ORDER — AZITHROMYCIN 500 MG PO TABS: 500.0000 mg | ORAL_TABLET | Freq: Every day | ORAL | 0 refills | Status: AC

## 2018-01-17 MED ORDER — WARFARIN SODIUM 2.5 MG PO TABS: 2.5000 mg | ORAL_TABLET | Freq: Once | ORAL | Status: DC

## 2018-01-17 MED ORDER — AMOXICILLIN-POT CLAVULANATE 875-125 MG PO TABS: 1.0000 | ORAL_TABLET | Freq: Two times a day (BID) | ORAL | 0 refills | Status: AC

## 2018-01-17 MED ORDER — PREDNISONE 20 MG PO TABS: 40.0000 mg | ORAL_TABLET | Freq: Every day | ORAL | 0 refills | Status: DC

## 2018-01-17 MED ORDER — DM-GUAIFENESIN ER 30-600 MG PO TB12: 1.0000 | ORAL_TABLET | Freq: Two times a day (BID) | ORAL | 0 refills | Status: AC

## 2018-01-17 NOTE — Care Management Note (Signed)
Case Management Note  Patient Details  Name: [name] MRN: [id number] Date of Birth: [birth year]  Subjective/Objective:     Acute Bronchitis. From home with wife. Very pleasant, but wants to DC home today.   Walks with cane. On room air during assessment, reports he walked with PT without oxygen. Recommended for home health PT. Patient agreeable, calls wife for agency, wife elects [facility].  Has PCP, no issues with transportation or affording medications.                Action/Plan: DC home with [facility] notified and will obtain orders via Epic.   Expected Discharge Date:   01/18/18               Expected Discharge Plan:  [place]  In-House Referral:     Discharge planning Services  CM Consult  Post Acute Care Choice:  Home Health Choice offered to:  Patient, Spouse  DME Arranged:    DME Agency:     HH Arranged:  PT [place]:  [place]  Status of Service:  Completed, signed off  If discussed at [place] of Stay Meetings, dates discussed:    Additional Comments:  [name], Randall Reading, RN 01/17/2018, 11:26 AM

## 2018-01-17 NOTE — Progress Notes (Signed)
ANTICOAGULATION CONSULT NOTE - Initial Consult  Pharmacy Consult for warfarin Indication: atrial fibrillation  Allergies  Allergen Reactions  . Crestor [Rosuvastatin Calcium] Other (See Comments)    Urination of blood  . Latex Rash and Other (See Comments)    Gloves cause welts on hands!!    Patient Measurements: Height: 5' 11" (180.3 cm) Weight: 203 lb 14.8 oz (92.5 kg) IBW/kg (Calculated) : 75.3   Vital Signs: Temp: 98 F (36.7 C) (12/19 0601) Temp Source: Oral (12/19 0601) BP: 127/76 (12/19 0601) Pulse Rate: 60 (12/19 0601)  Labs: Recent Labs    01/16/18 1120 01/16/18 1709 01/16/18 2309 01/17/18 0449  HGB 9.2*  --   --  8.4*  HCT 31.5*  --   --  29.2*  PLT 151  --   --  132*  LABPROT 25.4*  --   --  26.9*  INR 2.35  --   --  2.53  CREATININE 1.40*  --   --  0.84  TROPONINI 0.09* 0.09* 0.06* 0.04*    Estimated Creatinine Clearance: 93.8 mL/min (by C-G formula based on SCr of 0.84 mg/dL).   Medical History: Past Medical History:  Diagnosis Date  . Aortic root dilatation ([place]) 02/02/2011  . Arthritis    "lower back; going back down both my sciatic nerves"  . 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 BENIGN PROSTATIC HYPERTROPHY 08/29/2006   takes Flomax daily  . 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 Cataracts, bilateral    immature  . CHF (congestive heart failure) ([facility])    takes Lasix daily  . Chronic back pain    HNP   . CORONARY ARTERY DISEASE 08/29/2006   takes Coumadin daily  . DEPRESSION 08/29/2006  . DIABETES MELLITUS, TYPE II 08/29/2006   takes Metformin,Januvia,and Glipizide  daily  . DIASTOLIC HEART FAILURE, CHRONIC 06/09/2008  . GOUT 04/22/2007   takes Allopurinol daily  . History of migraine    60yr ago  . HYPERLIPIDEMIA 08/29/2006   takes Atorvastatin daily  . HYPERTENSION 08/29/2006   takes Lisinopril daily  . INSOMNIA-SLEEP DISORDER-UNSPEC 10/23/2007  . Left lumbar radiculopathy 05/30/2010  . LUMBAR RADICULOPATHY, RIGHT  06/10/2007  . Muscle spasm    takes Zanaflex daily  . [place] 12/27/10   "I've had several MIs"  . 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 PACEMAKER, PERMANENT 03/04/2008   pt denies this date  . Peripheral neuropathy    takes Gabapentin daily  . Presence of permanent cardiac pacemaker   . Shortness of breath dyspnea    "all my life" with exertion  . Sleep apnea    "if I lay flat I quit breathing; HOB up I'm fine"    Medications:  Medications Prior to Admission  Medication Sig Dispense Refill Last Dose  . allopurinol (ZYLOPRIM) 300 MG tablet TAKE 1 TABLET BY MOUTH ONCE DAILY 90 tablet 1 Taking  . atorvastatin (LIPITOR) 80 MG tablet Take 1 tablet (80 mg total) by mouth daily. 540 tablet 3 01/15/2018 at Unknown time  . Blood Glucose Monitoring Suppl (ONE TOUCH ULTRA 2) w/Device KIT Use as directed 1 each 3 01/15/2018 at Unknown time  . carvedilol (COREG) 25 MG tablet TAKE 1 TABLET BY MOUTH TWO  TIMES DAILY 180 tablet 2 01/15/2018 at 1900  . Cholecalciferol (VITAMIN D) 1000 UNITS capsule Take 1,000 Units by mouth 2 (two) times daily. Reported on 02/15/2015   Past Month at Unknown time  . donepezil (ARICEPT) 5 MG tablet Take 1 tablet (5  mg total) by mouth daily. 90 tablet 3 01/15/2018 at Unknown time  . fluticasone (FLONASE) 50 MCG/ACT nasal spray USE 2 SPRAYS IN EACH  NOSTRIL EVERY DAY 48 g 0 01/15/2018 at Unknown time  . furosemide (LASIX) 40 MG tablet Take 2 tablets (80 mg total) by mouth 2 (two) times daily. 540 tablet 2 01/15/2018 at Unknown time  . gabapentin (NEURONTIN) 300 MG capsule TAKE 2 CAPSULES BY MOUTH 3  TIMES DAILY 540 capsule 3 01/15/2018 at Unknown time  . glipiZIDE (GLUCOTROL XL) 10 MG 24 hr tablet TAKE 1 TABLET BY MOUTH  DAILY WITH BREAKFAST 90 tablet 2 01/15/2018 at Unknown time  . glucose blood (ONE TOUCH ULTRA TEST) test strip CHECK BLOOD SUGAR TWO TIMES DAILY 100 each 3 01/15/2018 at Unknown time  . lisinopril (PRINIVIL,ZESTRIL) 2.5 MG tablet  TAKE 1 TABLET BY MOUTH  DAILY 90 tablet 1 01/15/2018 at Unknown time  . metFORMIN (GLUCOPHAGE) 500 MG tablet TAKE 2 TABLETS BY MOUTH TWO TIMES DAILY 360 tablet 2 01/15/2018 at Unknown time  . Multiple Vitamin (MULTIVITAMIN WITH MINERALS) TABS tablet Take 1 tablet by mouth every morning.   01/15/2018 at Unknown time  . ONETOUCH DELICA LANCETS 38G MISC USE AS DIRECTED THREE TIMES DAILY TO CHECK BLOOD SUGAR 300 each 0 01/15/2018 at Unknown time  . oxybutynin (DITROPAN XL) 10 MG 24 hr tablet Take 1 tablet (10 mg total) by mouth at bedtime. 90 tablet 3 01/15/2018 at Unknown time  . potassium chloride SA (K-DUR,KLOR-CON) 20 MEQ tablet TAKE 1 TABLET BY MOUTH  EVERY EVENING 90 tablet 2 01/15/2018 at Unknown time  . sitaGLIPtin (JANUVIA) 100 MG tablet Take 1 tablet (100 mg total) by mouth daily. 90 tablet 3 01/15/2018 at Unknown time  . tamsulosin (FLOMAX) 0.4 MG CAPS capsule TAKE 1 CAPSULE BY MOUTH  EVERY 12 HOURS 180 capsule 2 01/15/2018 at Unknown time  . tiZANidine (ZANAFLEX) 4 MG tablet TAKE 1 TABLET BY MOUTH   EVERY 6 HOURS AS NEEDED FOR MUSCLE SPASMS. 180 tablet 2 01/15/2018 at Unknown time  . warfarin (COUMADIN) 5 MG tablet Take 1/2 tablet daily except 1 tablet on Mondays, Wednesdays and Fridays (Patient taking differently: Take 2.5-5 mg by mouth See admin instructions. Take 1/2 tablet daily except 1 tablet on Mondays, Wednesdays and Fridays) 90 tablet 3 Past Week at 1800  . warfarin (COUMADIN) 5 MG tablet Take 1/2 to 1 tablet by mouth daily as directed by coumadin clinic 90 tablet 1 01/15/2018 at 1800  . albuterol (PROVENTIL HFA;VENTOLIN HFA) 108 (90 Base) MCG/ACT inhaler Inhale 2 puffs into the lungs every 6 (six) hours as needed for wheezing or shortness of breath. 1 Inhaler 2 unknown  . HYDROcodone-acetaminophen (NORCO) 10-325 MG tablet Take 1 tablet by mouth daily as needed. 30 tablet 0 unknown    Assessment: Pharmacy consulted to dose warfarin in patient with atrial fibrillation.   Patient's INR  is therapeutic at 2.53.  Home dose of warfarin is 5 mg MWF and 2.5 mg ROW.  Last dose given 12/17.  Goal of Therapy:  INR 2-3 Monitor platelets by anticoagulation protocol: Yes   Plan:  Warfarin 2.5 mg x 1 dose. Monitor daily INR and s/s of bleeding  Revonda Standard Ahaana Rochette 01/17/2018,10:23 AM

## 2018-01-17 NOTE — Care Management Obs Status (Signed)
[facility] NOTIFICATION   Patient Details  Name: AZIZ SLAPE MRN: [id number] Date of Birth: 08-May-1945   Medicare Observation Status Notification Given:  Yes    Sherrilynn Gudgel, Chauncey Reading, RN 01/17/2018, 11:24 AM

## 2018-01-17 NOTE — Progress Notes (Signed)
Walked patient at this time, he maintained 96% on RA during before and immediately after the walk. Notified Dr. Roderic Palau

## 2018-01-17 NOTE — Evaluation (Signed)
Physical Therapy Evaluation Patient Details Name: [name] MRN: [id number] DOB: 12/12/1945 Today's Date: 01/17/2018   History of Present Illness  [name] is a 72 y.o. male with medical history significant of atrial fibrillation on anticoagulation, status post pacemaker, BPH, coronary artery disease, diabetes, hyperlipidemia, hypertension, presents to the hospital with complaints of left knee pain and shortness of breath.  Describes knee pain as starting approximately 1 week ago.  Denies any specific injury or trauma to his knee.  He says that his pain has progressively gotten worse over the last week.  He is unable to walk on it at this point.  For the past 3 to 4 days, he has had worsening shortness of breath, productive cough of clear sputum, wheezing.  He denies any chest pain.  No vomiting or diarrhea.    Clinical Impression  Patient functioning near baseline for functional mobility and gait, since start of knee pain 1 week ago, having to use SPC due to this, able to ambulate in room safely but limited secondary to c/o fatigue.  Patient encouraged to use his RW at home in order to avoid aggravating left knee pain and use immobilizer as instructed by his MD.  Patient tolerated sitting up in chair after therapy.  Patient will benefit from continued physical therapy in hospital and recommended venue below to increase strength, balance, endurance for safe ADLs and gait.     Follow Up Recommendations Home health PT;Supervision - Intermittent    Equipment Recommendations  None recommended by PT    Recommendations for Other Services       Precautions / Restrictions Precautions Precautions: Fall Required Braces or Orthoses: Knee Immobilizer - Left Knee Immobilizer - Left: On when out of bed or walking Restrictions Weight Bearing Restrictions: No      Mobility  Bed Mobility Overal bed mobility: Modified Independent             General bed mobility comments:  increased time  Transfers Overall transfer level: Needs assistance Equipment used: Rolling walker (2 wheeled) Transfers: Sit to/from [facility] Sit to Stand: Supervision Stand pivot transfers: Supervision          Ambulation/Gait Ambulation/Gait assistance: Supervision Gait Distance (Feet): 22 Feet Assistive device: Rolling walker (2 wheeled) Gait Pattern/deviations: Decreased step length - right;Decreased step length - left;Decreased stride length Gait velocity: decreased   General Gait Details: slow slightly labored cadence without loss of balance, wearing left knee immobilizer, limited secondary to fatigue  Stairs            Wheelchair Mobility    Modified Rankin (Stroke Patients Only)       Balance Overall balance assessment: Needs assistance Sitting-balance support: Feet supported;No upper extremity supported Sitting balance-Leahy Scale: Good     Standing balance support: During functional activity;Bilateral upper extremity supported Standing balance-Leahy Scale: Fair Standing balance comment: using RW                             Pertinent Vitals/Pain Pain Assessment: 0-10 Pain Score: 8  Pain Location: left knee Pain Descriptors / Indicators: Sore Pain Intervention(s): Limited activity within patient's tolerance;Monitored during session    Home Living Family/patient expects to be discharged to:: Private residence Living Arrangements: Spouse/significant other;Children Available Help at Discharge: Family Type of Home: House Home Access: Stairs to enter;Ramped entrance Entrance Stairs-Rails: Left Entrance Stairs-Number of Steps: 2 Home Layout: One level Home Equipment: Cane - single point;Walker -  2 wheels;Shower seat;Bedside commode;Wheelchair - manual      Prior Function Level of Independence: Independent with assistive device(s)         Comments: Hydrographic surveyor, drives, has been using SPC since left knee  pain     Hand Dominance   Dominant Hand: Right    Extremity/Trunk Assessment   Upper Extremity Assessment Upper Extremity Assessment: Overall WFL for tasks assessed    Lower Extremity Assessment Lower Extremity Assessment: Overall WFL for tasks assessed;LLE deficits/detail LLE Deficits / Details: grossly -4/5 except left knee not tested due to in immobilizer brace LLE: Unable to fully assess due to immobilization    Cervical / Trunk Assessment Cervical / Trunk Assessment: Normal  Communication   Communication: No difficulties  Cognition Arousal/Alertness: Awake/alert Behavior During Therapy: WFL for tasks assessed/performed Overall Cognitive Status: Within Functional Limits for tasks assessed                                        General Comments      Exercises     Assessment/Plan    PT Assessment Patient needs continued PT services  PT Problem List Decreased strength;Decreased activity tolerance;Decreased balance;Decreased mobility;Decreased range of motion(left knee in immobilizer)       PT Treatment Interventions Gait training;Stair training;Therapeutic activities;Functional mobility training;Patient/family education;Therapeutic exercise    PT Goals (Current goals can be found in the Care Plan section)  Acute Rehab PT Goals Patient Stated Goal: return home PT Goal Formulation: With patient Time For Goal Achievement: 01/24/18 Potential to Achieve Goals: Good    Frequency Min 3X/week   Barriers to discharge        Co-evaluation               AM-PAC PT "6 Clicks" Mobility  Outcome Measure Help needed turning from your back to your side while in a flat bed without using bedrails?: None Help needed moving from lying on your back to sitting on the side of a flat bed without using bedrails?: None Help needed moving to and from a bed to a chair (including a wheelchair)?: None Help needed standing up from a chair using your arms (e.g.,  wheelchair or bedside chair)?: A Little Help needed to walk in hospital room?: A Little Help needed climbing 3-5 steps with a railing? : A Lot 6 Click Score: 20    End of Session Equipment Utilized During Treatment: Left knee immobilizer Activity Tolerance: Patient tolerated treatment well;Patient limited by fatigue;Patient limited by pain Patient left: in chair;with call bell/phone within reach;with family/visitor present Nurse Communication: Mobility status PT Visit Diagnosis: Unsteadiness on feet (R26.81);Other abnormalities of gait and mobility (R26.89);Muscle weakness (generalized) (M62.81)    Time: 8366-2947 PT Time Calculation (min) (ACUTE ONLY): 22 min   Charges:   PT Evaluation $PT Eval Moderate Complexity: 1 Mod PT Treatments $Therapeutic Activity: 23-37 mins        2:45 PM, 01/17/18 Lonell Grandchild, MPT Physical Therapist with [facility] [phone] office [phone] mobile phone

## 2018-01-17 NOTE — Plan of Care (Signed)
  Problem: Acute Rehab PT Goals(only PT should resolve) Goal: Patient Will Transfer Sit To/From Stand Outcome: Progressing Flowsheets (Taken 01/17/2018 1447) Patient will transfer sit to/from stand: with modified independence Goal: Pt Will Transfer Bed To Chair/Chair To Bed Outcome: Progressing Flowsheets (Taken 01/17/2018 1447) Pt will Transfer Bed to Chair/Chair to Bed: with modified independence Goal: Pt Will Ambulate Outcome: Progressing Flowsheets (Taken 01/17/2018 1447) Pt will Ambulate: 50 feet; with modified independence; with rolling walker   2:48 PM, 01/17/18 Lonell Grandchild, MPT Physical Therapist with [facility] 336 [phone] office [phone] mobile phone

## 2018-01-17 NOTE — Discharge Summary (Signed)
Physician Discharge Summary  [name] MRN:[id number] DOB: 02/27/1945 DOA: 01/16/2018  PCP: John, James W, MD  Admit date: 01/16/2018 Discharge date: 01/17/2018  Admitted From: Home Disposition: Home  Recommendations for Outpatient Follow-up:  1. Follow up with PCP in 1-2 weeks 2. Please obtain BMP/CBC in one week 3. Outpatient follow-up scheduled with Dr. Yates for left knee pain  Home Health: Home health PT Equipment/Devices:  Discharge Condition: Stable CODE STATUS: DNR Diet recommendation: Heart healthy, carb modified  Brief/Interim Summary: 72-year-old male with a history of atrial fibrillation on anticoagulation, BPH, coronary artery disease, hypertension hyperlipidemia, diabetes, presents to the hospital with complaints of left knee pain and shortness of breath.  Describes his knee pain occurring approximately 1 week ago.  He denied any specific injury or trauma to his knee.  He said his pain has progressively gotten worse over the past week.  He was unable to walk at the time of admission.  He also described 3 to 4 days of worsening shortness of breath, productive cough, wheezing.  He was noted to be mildly febrile in the emergency room.  Oxygen saturations on room air were noted to be in the 80s in the emergency room.  Chest x-ray showed possible early pneumonia.  Patient was admitted to the hospital and started on intravenous steroids, bronchodilators and antibiotics.  He was noted to be clinically dehydrated and started on IV fluids.  By the following day, renal function had improved.  He had mild elevation of troponin without any chest pain or EKG changes.  This was felt to be demand ischemia.  His renal function had normalized by the following day.  Lungs were clear and he was breathing comfortably on room air.  He was able to ambulate without difficulty.  His cough had resolved.  He is been transitioned to prednisone taper as well as a course of antibiotics.  Regarding  his left knee pain, plain films indicated cortical irregularity indicating possible avulsion injury.  Case was discussed with Dr. Nate, on-call for [facility] who recommended a left knee immobilizer and follow-up with Dr. Yates as an outpatient.  Follow-up appointment has been scheduled prior to discharge.  He was seen by physical therapy who recommended home health physical therapy.  Patient feels ready for discharge home.  Discharge Diagnoses:  Principal Problem:   Acute respiratory failure with hypoxia (HCC) Active Problems:   Type 2 diabetes, uncontrolled, with neuropathy (HCC)   Hyperlipidemia   Essential hypertension   DIASTOLIC HEART FAILURE, CHRONIC   PACEMAKER, PERMANENT   Acute bronchitis   Atrial fibrillation (HCC)   Long term current use of anticoagulant therapy   Left knee pain   Elevated troponin    Discharge Instructions  Discharge Instructions    Diet - low sodium heart healthy   Complete by:  As directed    Increase activity slowly   Complete by:  As directed      Allergies as of 01/17/2018      Reactions   Crestor [rosuvastatin Calcium] Other (See Comments)   Urination of blood   Latex Rash, Other (See Comments)   Gloves cause welts on hands!!      Medication List    TAKE these medications   albuterol 108 (90 Base) MCG/ACT inhaler Commonly known as:  PROVENTIL HFA;VENTOLIN HFA Inhale 2 puffs into the lungs every 6 (six) hours as needed for wheezing or shortness of breath.   allopurinol 300 MG tablet Commonly known as:  ZYLOPRIM TAKE 1   TABLET BY MOUTH ONCE DAILY   amoxicillin-clavulanate 875-125 MG tablet Commonly known as:  AUGMENTIN Take 1 tablet by mouth 2 (two) times daily for 5 days.   atorvastatin 80 MG tablet Commonly known as:  LIPITOR Take 1 tablet (80 mg total) by mouth daily.   azithromycin 500 MG tablet Commonly known as:  ZITHROMAX Take 1 tablet (500 mg total) by mouth daily for 3 days. Take 1 tablet daily for 3 days.    carvedilol 25 MG tablet Commonly known as:  COREG TAKE 1 TABLET BY MOUTH TWO  TIMES DAILY   dextromethorphan-guaiFENesin 30-600 MG 12hr tablet Commonly known as:  MUCINEX DM Take 1 tablet by mouth 2 (two) times daily.   donepezil 5 MG tablet Commonly known as:  ARICEPT Take 1 tablet (5 mg total) by mouth daily.   fluticasone 50 MCG/ACT nasal spray Commonly known as:  FLONASE USE 2 SPRAYS IN EACH  NOSTRIL EVERY DAY   furosemide 40 MG tablet Commonly known as:  LASIX Take 2 tablets (80 mg total) by mouth 2 (two) times daily.   gabapentin 300 MG capsule Commonly known as:  NEURONTIN TAKE 2 CAPSULES BY MOUTH 3  TIMES DAILY   glipiZIDE 10 MG 24 hr tablet Commonly known as:  GLUCOTROL XL TAKE 1 TABLET BY MOUTH  DAILY WITH BREAKFAST   glucose blood test strip Commonly known as:  ONE TOUCH ULTRA TEST CHECK BLOOD SUGAR TWO TIMES DAILY   HYDROcodone-acetaminophen 10-325 MG tablet Commonly known as:  NORCO Take 1 tablet by mouth daily as needed.   lisinopril 2.5 MG tablet Commonly known as:  PRINIVIL,ZESTRIL TAKE 1 TABLET BY MOUTH  DAILY   metFORMIN 500 MG tablet Commonly known as:  GLUCOPHAGE TAKE 2 TABLETS BY MOUTH TWO TIMES DAILY   multivitamin with minerals Tabs tablet Take 1 tablet by mouth every morning.   ONE TOUCH ULTRA 2 w/Device Kit Use as directed   ONETOUCH DELICA LANCETS 33G Misc USE AS DIRECTED THREE TIMES DAILY TO CHECK BLOOD SUGAR   oxybutynin 10 MG 24 hr tablet Commonly known as:  DITROPAN XL Take 1 tablet (10 mg total) by mouth at bedtime.   potassium chloride SA 20 MEQ tablet Commonly known as:  K-DUR,KLOR-CON TAKE 1 TABLET BY MOUTH  EVERY EVENING   predniSONE 20 MG tablet Commonly known as:  DELTASONE Take 2 tablets (40 mg total) by mouth daily.   sitaGLIPtin 100 MG tablet Commonly known as:  JANUVIA Take 1 tablet (100 mg total) by mouth daily.   tamsulosin 0.4 MG Caps capsule Commonly known as:  FLOMAX TAKE 1 CAPSULE BY MOUTH  EVERY  12 HOURS   tiZANidine 4 MG tablet Commonly known as:  ZANAFLEX TAKE 1 TABLET BY MOUTH   EVERY 6 HOURS AS NEEDED FOR MUSCLE SPASMS.   Vitamin D 1000 units capsule Take 1,000 Units by mouth 2 (two) times daily. Reported on 02/15/2015   warfarin 5 MG tablet Commonly known as:  COUMADIN Take as directed. If you are unsure how to take this medication, talk to your nurse or doctor. Original instructions:  Take 1/2 tablet daily except 1 tablet on Mondays, Wednesdays and Fridays What changed:    how much to take  how to take this  when to take this   warfarin 5 MG tablet Commonly known as:  COUMADIN Take as directed. If you are unsure how to take this medication, talk to your nurse or doctor. Original instructions:  Take 1/2 to 1 tablet by mouth   daily as directed by coumadin clinic What changed:  Another medication with the same name was changed. Make sure you understand how and when to take each.      Follow-up Information    Marybelle Killings, MD On 02/05/2018.   Specialty:  Orthopedic Surgery Why:  at 8:30 am  [facility] office) Contact information: [address] [place] [address] [phone]          Allergies  Allergen Reactions  . Crestor [Rosuvastatin Calcium] Other (See Comments)    Urination of blood  . Latex Rash and Other (See Comments)    Gloves cause welts on hands!!    Consultations:     Procedures/Studies: Dg Chest 2 View  Result Date: 01/16/2018 CLINICAL DATA:  Cough and sob/left knee pain at patella/no known injury/htn/diabetic/hx pacemaker insertion/hx cabg x 2/ex smoker EXAM: CHEST - 2 VIEW COMPARISON:  09/24/2017 FINDINGS: Low lung volumes with atelectasis or infiltrates in the lung bases. Can not exclude central pulmonary vascular congestion. Heart size upper limits normal. Stable left subclavian transvenous pacemaker. No effusion. Previous median sternotomy. Anterior vertebral endplate spurring at multiple levels in the mid and lower  thoracic spine. IMPRESSION: Low volumes with bibasilar atelectasis or infiltrates. Electronically Signed   By: Lucrezia Europe M.D.   On: 01/16/2018 13:28   US Venous Img Lower Unilateral Left  Result Date: 01/16/2018 CLINICAL DATA:  Pain x2 weeks EXAM: LEFT LOWER EXTREMITY VENOUS DOPPLER ULTRASOUND TECHNIQUE: Gray-scale sonography with compression, as well as color and duplex ultrasound, were performed to evaluate the deep venous system from the level of the common femoral vein through the popliteal and proximal calf veins. COMPARISON:  03/16/2017 FINDINGS: Normal compressibility of the common femoral, superficial femoral, and popliteal veins, as well as the proximal calf veins. No filling defects to suggest DVT on grayscale or color Doppler imaging. Doppler waveforms show normal direction of venous flow, normal respiratory phasicity and response to augmentation. Visualized segments of the saphenous venous system normal in caliber and compressibility. Survey views of the contralateral common femoral vein are unremarkable. IMPRESSION: No femoropopliteal and no calf DVT in the visualized calf veins. If clinical symptoms are inconsistent or if there are persistent or worsening symptoms, further imaging (possibly involving the iliac veins) may be warranted. Electronically Signed   By: Lucrezia Europe M.D.   On: 01/16/2018 12:48   Dg Knee Complete 4 Views Left  Result Date: 01/16/2018 CLINICAL DATA:  Cough and sob/left knee pain at patella/no known injury/htn/diabetic/hx pacemaker insertion/hx cabg x 2/ex smoker EXAM: LEFT KNEE - COMPLETE 4+ VIEW COMPARISON:  06/28/2016 FINDINGS: Chondrocalcinosis most conspicuous in the lateral compartment. Cortical irregularity along the lateral femoral condyle suggesting pes anserinus avulsion. No other fracture or dislocation. No effusion. Small marginal spur from the patellar articular surface. Patchy arterial vascular calcifications. IMPRESSION: Cortical irregularity along the  lateral femoral condyle suggesting avulsion injury. If symptoms persist, MR may be useful for further characterization. Electronically Signed   By: Lucrezia Europe M.D.   On: 01/16/2018 13:59      Subjective: Breathing is better.  Overall he is feeling better.  Cough is better.  Difficulty walking with knee immobilizer.  Discharge Exam: Vitals:   01/16/18 2150 01/17/18 0601 01/17/18 0745 01/17/18 1408  BP: 124/73 127/76  128/89  Pulse: 64 60  62  Resp: _0 Temp: 98.4 F (36.9 C) 98 F (36.7 C)  98.5 F (36.9 C)  TempSrc: Oral Oral  Oral  SpO2: 94% 94% Marland Kitchen)  88% 95%  Weight:      Height:        General: Pt is alert, awake, not in acute distress Cardiovascular: RRR, S1/S2 +, no rubs, no gallops Respiratory: CTA bilaterally, no wheezing, no rhonchi Abdominal: Soft, NT, ND, bowel sounds + Extremities: no edema, no cyanosis, left leg in knee immobilizer    The results of significant diagnostics from this hospitalization (including imaging, microbiology, ancillary and laboratory) are listed below for reference.     Microbiology: No results found for this or any previous visit (from the past 240 hour(s)).   Labs: BNP (last 3 results) Recent Labs    01/16/18 1120  BNP 196.2*   Basic Metabolic Panel: Recent Labs  Lab 01/16/18 1120 01/17/18 0449  NA 138 138  K 4.0 4.0  CL 102 104  CO2 27 27  GLUCOSE 138* 243*  BUN 29* 26*  CREATININE 1.40* 0.84  CALCIUM 8.7* 8.6*   Liver Function Tests: Recent Labs  Lab 01/16/18 1120 01/17/18 0449  AST 25 20  ALT 28 24  ALKPHOS 63 56  BILITOT 0.6 0.6  PROT 7.0 6.4*  ALBUMIN 3.9 3.4*   No results for input(s): LIPASE, AMYLASE in the last 168 hours. No results for input(s): AMMONIA in the last 168 hours. CBC: Recent Labs  Lab 01/16/18 1120 01/17/18 0449  WBC 8.1 4.6  NEUTROABS 5.3  --   HGB 9.2* 8.4*  HCT 31.5* 29.2*  MCV 88.0 87.2  PLT 151 132*   Cardiac Enzymes: Recent Labs  Lab 01/16/18 1120  01/16/18 1709 01/16/18 2309 01/17/18 0449  TROPONINI 0.09* 0.09* 0.06* 0.04*   BNP: Invalid input(s): POCBNP CBG: Recent Labs  Lab 01/16/18 1646 01/16/18 2154 01/17/18 0732 01/17/18 1144  GLUCAP 107* 180* 194* 285*   D-Dimer No results for input(s): DDIMER in the last 72 hours. Hgb A1c No results for input(s): HGBA1C in the last 72 hours. Lipid Profile No results for input(s): CHOL, HDL, LDLCALC, TRIG, CHOLHDL, LDLDIRECT in the last 72 hours. Thyroid function studies No results for input(s): TSH, T4TOTAL, T3FREE, THYROIDAB in the last 72 hours.  Invalid input(s): FREET3 Anemia work up No results for input(s): VITAMINB12, FOLATE, FERRITIN, TIBC, IRON, RETICCTPCT in the last 72 hours. Urinalysis    Component Value Date/Time   COLORURINE YELLOW 01/16/2018 1400   APPEARANCEUR CLEAR 01/16/2018 1400   LABSPEC 1.014 01/16/2018 1400   PHURINE 5.0 01/16/2018 1400   GLUCOSEU NEGATIVE 01/16/2018 1400   GLUCOSEU NEGATIVE 09/24/2017 1112   HGBUR NEGATIVE 01/16/2018 1400   BILIRUBINUR NEGATIVE 01/16/2018 1400   KETONESUR NEGATIVE 01/16/2018 1400   PROTEINUR NEGATIVE 01/16/2018 1400   UROBILINOGEN 0.2 09/24/2017 1112   NITRITE NEGATIVE 01/16/2018 1400   LEUKOCYTESUR NEGATIVE 01/16/2018 1400   Sepsis Labs Invalid input(s): PROCALCITONIN,  WBC,  LACTICIDVEN Microbiology No results found for this or any previous visit (from the past 240 hour(s)).   Time coordinating discharge: 67mns  SIGNED:   JKathie Dike MD  [facility] 01/17/2018, 7:20 PM Pager   If 7PM-7AM, please contact night-coverage [email or web address] Password [email or web address]

## 2018-01-18 ENCOUNTER — Telehealth: Payer: Self-pay | Admitting: *Deleted

## 2018-01-18 NOTE — Telephone Encounter (Signed)
Transition Care Management Follow-up Telephone Call   Date discharged? 01/17/18   How have you been since you were released from the hospital? Spoke w/wife, but husband was there with her asking the questions. He states that he is feeling ok   Do you understand why you were in the hospital? YES   Do you understand the discharge instructions? YES   Where were you discharged to? Home   Items Reviewed:  Medications reviewed: YES  Allergies reviewed: YES  Dietary changes reviewed: YES, heart healthy & carb modified  Referrals reviewed: YES, verified appt w/ortho that's been schedule for 02/05/18   Functional Questionnaire:   Activities of Daily Living (ADLs):   He states he are independent in the following: bathing and hygiene, feeding, continence, grooming, toileting and dressing States he require assistance with the following: ambulation   Any transportation issues/concerns?: NO   Any patient concerns? NO   Confirmed importance and date/time of follow-up visits scheduled YES, appt 01/31/18  Provider Appointment booked with [name]   Confirmed with patient if condition begins to worsen call PCP or go to the ER.  Patient was given the office number and encouraged to call back with question or concerns.  : YES

## 2018-01-20 DIAGNOSIS — J189 Pneumonia, unspecified organism: Secondary | ICD-10-CM | POA: Diagnosis not present

## 2018-01-20 DIAGNOSIS — M5431 Sciatica, right side: Secondary | ICD-10-CM | POA: Diagnosis not present

## 2018-01-20 DIAGNOSIS — J209 Acute bronchitis, unspecified: Secondary | ICD-10-CM | POA: Diagnosis not present

## 2018-01-20 DIAGNOSIS — M48062 Spinal stenosis, lumbar region with neurogenic claudication: Secondary | ICD-10-CM | POA: Diagnosis not present

## 2018-01-20 DIAGNOSIS — Z792 Long term (current) use of antibiotics: Secondary | ICD-10-CM | POA: Diagnosis not present

## 2018-01-20 DIAGNOSIS — I5032 Chronic diastolic (congestive) heart failure: Secondary | ICD-10-CM | POA: Diagnosis not present

## 2018-01-20 DIAGNOSIS — G4730 Sleep apnea, unspecified: Secondary | ICD-10-CM | POA: Diagnosis not present

## 2018-01-20 DIAGNOSIS — Z7901 Long term (current) use of anticoagulants: Secondary | ICD-10-CM | POA: Diagnosis not present

## 2018-01-20 DIAGNOSIS — I4891 Unspecified atrial fibrillation: Secondary | ICD-10-CM | POA: Diagnosis not present

## 2018-01-20 DIAGNOSIS — M7061 Trochanteric bursitis, right hip: Secondary | ICD-10-CM | POA: Diagnosis not present

## 2018-01-20 DIAGNOSIS — M109 Gout, unspecified: Secondary | ICD-10-CM | POA: Diagnosis not present

## 2018-01-20 DIAGNOSIS — N182 Chronic kidney disease, stage 2 (mild): Secondary | ICD-10-CM | POA: Diagnosis not present

## 2018-01-20 DIAGNOSIS — Z7984 Long term (current) use of oral hypoglycemic drugs: Secondary | ICD-10-CM | POA: Diagnosis not present

## 2018-01-20 DIAGNOSIS — Z7952 Long term (current) use of systemic steroids: Secondary | ICD-10-CM | POA: Diagnosis not present

## 2018-01-20 DIAGNOSIS — I130 Hypertensive heart and chronic kidney disease with heart failure and stage 1 through stage 4 chronic kidney disease, or unspecified chronic kidney disease: Secondary | ICD-10-CM | POA: Diagnosis not present

## 2018-01-20 DIAGNOSIS — Z87891 Personal history of nicotine dependence: Secondary | ICD-10-CM | POA: Diagnosis not present

## 2018-01-20 DIAGNOSIS — M5416 Radiculopathy, lumbar region: Secondary | ICD-10-CM | POA: Diagnosis not present

## 2018-01-20 DIAGNOSIS — M5412 Radiculopathy, cervical region: Secondary | ICD-10-CM | POA: Diagnosis not present

## 2018-01-20 DIAGNOSIS — G43909 Migraine, unspecified, not intractable, without status migrainosus: Secondary | ICD-10-CM | POA: Diagnosis not present

## 2018-01-20 DIAGNOSIS — I2510 Atherosclerotic heart disease of native coronary artery without angina pectoris: Secondary | ICD-10-CM | POA: Diagnosis not present

## 2018-01-20 DIAGNOSIS — M7062 Trochanteric bursitis, left hip: Secondary | ICD-10-CM | POA: Diagnosis not present

## 2018-01-20 DIAGNOSIS — E1142 Type 2 diabetes mellitus with diabetic polyneuropathy: Secondary | ICD-10-CM | POA: Diagnosis not present

## 2018-01-20 DIAGNOSIS — G8929 Other chronic pain: Secondary | ICD-10-CM | POA: Diagnosis not present

## 2018-01-20 DIAGNOSIS — E1122 Type 2 diabetes mellitus with diabetic chronic kidney disease: Secondary | ICD-10-CM | POA: Diagnosis not present

## 2018-01-21 ENCOUNTER — Telehealth: Payer: Self-pay | Admitting: Internal Medicine

## 2018-01-21 NOTE — Telephone Encounter (Signed)
Copied from [place] [id number]. Topic: Quick Communication - Home Health Verbal Orders >> Jan 21, 2018  4:17 PM Sheran Luz wrote: Caller/Agency: [facility] Number: [phone] Requesting OT/PT/Skilled Nursing/Social Work: PT  Frequency: 2w4

## 2018-01-22 NOTE — Telephone Encounter (Signed)
Verbal orders given  

## 2018-01-25 DIAGNOSIS — M7061 Trochanteric bursitis, right hip: Secondary | ICD-10-CM | POA: Diagnosis not present

## 2018-01-25 DIAGNOSIS — M5431 Sciatica, right side: Secondary | ICD-10-CM | POA: Diagnosis not present

## 2018-01-25 DIAGNOSIS — E1142 Type 2 diabetes mellitus with diabetic polyneuropathy: Secondary | ICD-10-CM | POA: Diagnosis not present

## 2018-01-25 DIAGNOSIS — M5412 Radiculopathy, cervical region: Secondary | ICD-10-CM | POA: Diagnosis not present

## 2018-01-25 DIAGNOSIS — M7062 Trochanteric bursitis, left hip: Secondary | ICD-10-CM | POA: Diagnosis not present

## 2018-01-25 DIAGNOSIS — J209 Acute bronchitis, unspecified: Secondary | ICD-10-CM | POA: Diagnosis not present

## 2018-01-25 DIAGNOSIS — M5416 Radiculopathy, lumbar region: Secondary | ICD-10-CM | POA: Diagnosis not present

## 2018-01-25 DIAGNOSIS — G43909 Migraine, unspecified, not intractable, without status migrainosus: Secondary | ICD-10-CM | POA: Diagnosis not present

## 2018-01-25 DIAGNOSIS — N182 Chronic kidney disease, stage 2 (mild): Secondary | ICD-10-CM | POA: Diagnosis not present

## 2018-01-25 DIAGNOSIS — Z7984 Long term (current) use of oral hypoglycemic drugs: Secondary | ICD-10-CM | POA: Diagnosis not present

## 2018-01-25 DIAGNOSIS — I4891 Unspecified atrial fibrillation: Secondary | ICD-10-CM | POA: Diagnosis not present

## 2018-01-25 DIAGNOSIS — Z792 Long term (current) use of antibiotics: Secondary | ICD-10-CM | POA: Diagnosis not present

## 2018-01-25 DIAGNOSIS — I130 Hypertensive heart and chronic kidney disease with heart failure and stage 1 through stage 4 chronic kidney disease, or unspecified chronic kidney disease: Secondary | ICD-10-CM | POA: Diagnosis not present

## 2018-01-25 DIAGNOSIS — Z87891 Personal history of nicotine dependence: Secondary | ICD-10-CM | POA: Diagnosis not present

## 2018-01-25 DIAGNOSIS — M109 Gout, unspecified: Secondary | ICD-10-CM | POA: Diagnosis not present

## 2018-01-25 DIAGNOSIS — Z7952 Long term (current) use of systemic steroids: Secondary | ICD-10-CM | POA: Diagnosis not present

## 2018-01-25 DIAGNOSIS — J189 Pneumonia, unspecified organism: Secondary | ICD-10-CM | POA: Diagnosis not present

## 2018-01-25 DIAGNOSIS — E1122 Type 2 diabetes mellitus with diabetic chronic kidney disease: Secondary | ICD-10-CM | POA: Diagnosis not present

## 2018-01-25 DIAGNOSIS — G8929 Other chronic pain: Secondary | ICD-10-CM | POA: Diagnosis not present

## 2018-01-25 DIAGNOSIS — I2510 Atherosclerotic heart disease of native coronary artery without angina pectoris: Secondary | ICD-10-CM | POA: Diagnosis not present

## 2018-01-25 DIAGNOSIS — M48062 Spinal stenosis, lumbar region with neurogenic claudication: Secondary | ICD-10-CM | POA: Diagnosis not present

## 2018-01-25 DIAGNOSIS — Z7901 Long term (current) use of anticoagulants: Secondary | ICD-10-CM | POA: Diagnosis not present

## 2018-01-25 DIAGNOSIS — G4730 Sleep apnea, unspecified: Secondary | ICD-10-CM | POA: Diagnosis not present

## 2018-01-25 DIAGNOSIS — I5032 Chronic diastolic (congestive) heart failure: Secondary | ICD-10-CM | POA: Diagnosis not present

## 2018-01-28 ENCOUNTER — Telehealth: Payer: Self-pay | Admitting: Internal Medicine

## 2018-01-28 DIAGNOSIS — Z792 Long term (current) use of antibiotics: Secondary | ICD-10-CM | POA: Diagnosis not present

## 2018-01-28 DIAGNOSIS — Z87891 Personal history of nicotine dependence: Secondary | ICD-10-CM | POA: Diagnosis not present

## 2018-01-28 DIAGNOSIS — M109 Gout, unspecified: Secondary | ICD-10-CM | POA: Diagnosis not present

## 2018-01-28 DIAGNOSIS — G8929 Other chronic pain: Secondary | ICD-10-CM | POA: Diagnosis not present

## 2018-01-28 DIAGNOSIS — G43909 Migraine, unspecified, not intractable, without status migrainosus: Secondary | ICD-10-CM | POA: Diagnosis not present

## 2018-01-28 DIAGNOSIS — M5416 Radiculopathy, lumbar region: Secondary | ICD-10-CM | POA: Diagnosis not present

## 2018-01-28 DIAGNOSIS — M48062 Spinal stenosis, lumbar region with neurogenic claudication: Secondary | ICD-10-CM | POA: Diagnosis not present

## 2018-01-28 DIAGNOSIS — I130 Hypertensive heart and chronic kidney disease with heart failure and stage 1 through stage 4 chronic kidney disease, or unspecified chronic kidney disease: Secondary | ICD-10-CM | POA: Diagnosis not present

## 2018-01-28 DIAGNOSIS — M5412 Radiculopathy, cervical region: Secondary | ICD-10-CM | POA: Diagnosis not present

## 2018-01-28 DIAGNOSIS — M5431 Sciatica, right side: Secondary | ICD-10-CM | POA: Diagnosis not present

## 2018-01-28 DIAGNOSIS — E1142 Type 2 diabetes mellitus with diabetic polyneuropathy: Secondary | ICD-10-CM | POA: Diagnosis not present

## 2018-01-28 DIAGNOSIS — I2510 Atherosclerotic heart disease of native coronary artery without angina pectoris: Secondary | ICD-10-CM | POA: Diagnosis not present

## 2018-01-28 DIAGNOSIS — J209 Acute bronchitis, unspecified: Secondary | ICD-10-CM | POA: Diagnosis not present

## 2018-01-28 DIAGNOSIS — I4891 Unspecified atrial fibrillation: Secondary | ICD-10-CM | POA: Diagnosis not present

## 2018-01-28 DIAGNOSIS — Z7952 Long term (current) use of systemic steroids: Secondary | ICD-10-CM | POA: Diagnosis not present

## 2018-01-28 DIAGNOSIS — N182 Chronic kidney disease, stage 2 (mild): Secondary | ICD-10-CM | POA: Diagnosis not present

## 2018-01-28 DIAGNOSIS — Z7984 Long term (current) use of oral hypoglycemic drugs: Secondary | ICD-10-CM | POA: Diagnosis not present

## 2018-01-28 DIAGNOSIS — G4730 Sleep apnea, unspecified: Secondary | ICD-10-CM | POA: Diagnosis not present

## 2018-01-28 DIAGNOSIS — M7061 Trochanteric bursitis, right hip: Secondary | ICD-10-CM | POA: Diagnosis not present

## 2018-01-28 DIAGNOSIS — M7062 Trochanteric bursitis, left hip: Secondary | ICD-10-CM | POA: Diagnosis not present

## 2018-01-28 DIAGNOSIS — J189 Pneumonia, unspecified organism: Secondary | ICD-10-CM | POA: Diagnosis not present

## 2018-01-28 DIAGNOSIS — Z7901 Long term (current) use of anticoagulants: Secondary | ICD-10-CM | POA: Diagnosis not present

## 2018-01-28 DIAGNOSIS — E1122 Type 2 diabetes mellitus with diabetic chronic kidney disease: Secondary | ICD-10-CM | POA: Diagnosis not present

## 2018-01-28 DIAGNOSIS — I5032 Chronic diastolic (congestive) heart failure: Secondary | ICD-10-CM | POA: Diagnosis not present

## 2018-01-28 NOTE — Telephone Encounter (Signed)
Notified [name] w/MD response.Marland KitchenJohny Collier

## 2018-01-28 NOTE — Telephone Encounter (Signed)
MD is out of the office pls advise../lmb 

## 2018-01-28 NOTE — Telephone Encounter (Signed)
Copied from [place] [id number]. Topic: Quick Communication - Home Health Verbal Orders >> Jan 28, 2018 10:37 AM Alanda Slim E wrote: Caller/Agency: [place] Number: [phone]([facility] Vm) Requesting Social Work - Pt needs assistance at home and with community resources Frequency

## 2018-01-28 NOTE — Telephone Encounter (Signed)
yes

## 2018-01-31 ENCOUNTER — Other Ambulatory Visit (INDEPENDENT_AMBULATORY_CARE_PROVIDER_SITE_OTHER): Payer: Medicare Other

## 2018-01-31 ENCOUNTER — Encounter: Payer: Self-pay | Admitting: Internal Medicine

## 2018-01-31 ENCOUNTER — Ambulatory Visit (INDEPENDENT_AMBULATORY_CARE_PROVIDER_SITE_OTHER): Payer: Medicare Other | Admitting: Internal Medicine

## 2018-01-31 VITALS — BP 120/64 | HR 90 | Temp 97.7°F | Ht 71.0 in | Wt 202.0 lb

## 2018-01-31 DIAGNOSIS — E1140 Type 2 diabetes mellitus with diabetic neuropathy, unspecified: Secondary | ICD-10-CM

## 2018-01-31 DIAGNOSIS — E1165 Type 2 diabetes mellitus with hyperglycemia: Secondary | ICD-10-CM | POA: Diagnosis not present

## 2018-01-31 DIAGNOSIS — Z792 Long term (current) use of antibiotics: Secondary | ICD-10-CM | POA: Diagnosis not present

## 2018-01-31 DIAGNOSIS — I2510 Atherosclerotic heart disease of native coronary artery without angina pectoris: Secondary | ICD-10-CM | POA: Diagnosis not present

## 2018-01-31 DIAGNOSIS — Z7984 Long term (current) use of oral hypoglycemic drugs: Secondary | ICD-10-CM | POA: Diagnosis not present

## 2018-01-31 DIAGNOSIS — M109 Gout, unspecified: Secondary | ICD-10-CM | POA: Diagnosis not present

## 2018-01-31 DIAGNOSIS — J189 Pneumonia, unspecified organism: Secondary | ICD-10-CM | POA: Diagnosis not present

## 2018-01-31 DIAGNOSIS — M5412 Radiculopathy, cervical region: Secondary | ICD-10-CM | POA: Diagnosis not present

## 2018-01-31 DIAGNOSIS — J9601 Acute respiratory failure with hypoxia: Secondary | ICD-10-CM

## 2018-01-31 DIAGNOSIS — Z87891 Personal history of nicotine dependence: Secondary | ICD-10-CM | POA: Diagnosis not present

## 2018-01-31 DIAGNOSIS — J209 Acute bronchitis, unspecified: Secondary | ICD-10-CM | POA: Diagnosis not present

## 2018-01-31 DIAGNOSIS — I130 Hypertensive heart and chronic kidney disease with heart failure and stage 1 through stage 4 chronic kidney disease, or unspecified chronic kidney disease: Secondary | ICD-10-CM | POA: Diagnosis not present

## 2018-01-31 DIAGNOSIS — I38 Endocarditis, valve unspecified: Secondary | ICD-10-CM

## 2018-01-31 DIAGNOSIS — E1142 Type 2 diabetes mellitus with diabetic polyneuropathy: Secondary | ICD-10-CM | POA: Diagnosis not present

## 2018-01-31 DIAGNOSIS — M7061 Trochanteric bursitis, right hip: Secondary | ICD-10-CM | POA: Diagnosis not present

## 2018-01-31 DIAGNOSIS — IMO0002 Reserved for concepts with insufficient information to code with codable children: Secondary | ICD-10-CM

## 2018-01-31 DIAGNOSIS — I5032 Chronic diastolic (congestive) heart failure: Secondary | ICD-10-CM

## 2018-01-31 DIAGNOSIS — I4891 Unspecified atrial fibrillation: Secondary | ICD-10-CM | POA: Diagnosis not present

## 2018-01-31 DIAGNOSIS — N182 Chronic kidney disease, stage 2 (mild): Secondary | ICD-10-CM | POA: Diagnosis not present

## 2018-01-31 DIAGNOSIS — Z8709 Personal history of other diseases of the respiratory system: Secondary | ICD-10-CM

## 2018-01-31 DIAGNOSIS — E1122 Type 2 diabetes mellitus with diabetic chronic kidney disease: Secondary | ICD-10-CM | POA: Diagnosis not present

## 2018-01-31 DIAGNOSIS — M48062 Spinal stenosis, lumbar region with neurogenic claudication: Secondary | ICD-10-CM | POA: Diagnosis not present

## 2018-01-31 DIAGNOSIS — M5431 Sciatica, right side: Secondary | ICD-10-CM | POA: Diagnosis not present

## 2018-01-31 DIAGNOSIS — G43909 Migraine, unspecified, not intractable, without status migrainosus: Secondary | ICD-10-CM | POA: Diagnosis not present

## 2018-01-31 DIAGNOSIS — Z7952 Long term (current) use of systemic steroids: Secondary | ICD-10-CM | POA: Diagnosis not present

## 2018-01-31 DIAGNOSIS — I10 Essential (primary) hypertension: Secondary | ICD-10-CM

## 2018-01-31 DIAGNOSIS — Z7901 Long term (current) use of anticoagulants: Secondary | ICD-10-CM | POA: Diagnosis not present

## 2018-01-31 DIAGNOSIS — M7062 Trochanteric bursitis, left hip: Secondary | ICD-10-CM | POA: Diagnosis not present

## 2018-01-31 DIAGNOSIS — M5416 Radiculopathy, lumbar region: Secondary | ICD-10-CM | POA: Diagnosis not present

## 2018-01-31 DIAGNOSIS — G8929 Other chronic pain: Secondary | ICD-10-CM | POA: Diagnosis not present

## 2018-01-31 DIAGNOSIS — G4730 Sleep apnea, unspecified: Secondary | ICD-10-CM | POA: Diagnosis not present

## 2018-01-31 LAB — HEPATIC FUNCTION PANEL
ALT: 25 U/L (ref 0–53)
AST: 30 U/L (ref 0–37)
Albumin: 3.9 g/dL (ref 3.5–5.2)
Alkaline Phosphatase: 85 U/L (ref 39–117)
BILIRUBIN TOTAL: 0.4 mg/dL (ref 0.2–1.2)
Bilirubin, Direct: 0.1 mg/dL (ref 0.0–0.3)
Total Protein: 6.7 g/dL (ref 6.0–8.3)

## 2018-01-31 LAB — CBC WITH DIFFERENTIAL/PLATELET
Basophils Absolute: 0.1 10*3/uL (ref 0.0–0.1)
Basophils Relative: 0.7 % (ref 0.0–3.0)
EOS ABS: 0.3 10*3/uL (ref 0.0–0.7)
Eosinophils Relative: 4.2 % (ref 0.0–5.0)
HCT: 31.1 % — ABNORMAL LOW (ref 39.0–52.0)
Hemoglobin: 9.9 g/dL — ABNORMAL LOW (ref 13.0–17.0)
LYMPHS PCT: 20.4 % (ref 12.0–46.0)
Lymphs Abs: 1.7 10*3/uL (ref 0.7–4.0)
MCHC: 31.9 g/dL (ref 30.0–36.0)
MCV: 81.1 fl (ref 78.0–100.0)
Monocytes Absolute: 0.5 10*3/uL (ref 0.1–1.0)
Monocytes Relative: 6.6 % (ref 3.0–12.0)
Neutro Abs: 5.6 10*3/uL (ref 1.4–7.7)
Neutrophils Relative %: 68.1 % (ref 43.0–77.0)
Platelets: 195 10*3/uL (ref 150.0–400.0)
RBC: 3.84 Mil/uL — ABNORMAL LOW (ref 4.22–5.81)
RDW: 18.6 % — ABNORMAL HIGH (ref 11.5–15.5)
WBC: 8.2 10*3/uL (ref 4.0–10.5)

## 2018-01-31 LAB — BASIC METABOLIC PANEL
BUN: 23 mg/dL (ref 6–23)
CO2: 33 mEq/L — ABNORMAL HIGH (ref 19–32)
Calcium: 8.8 mg/dL (ref 8.4–10.5)
Chloride: 97 mEq/L (ref 96–112)
Creatinine, Ser: 1.29 mg/dL (ref 0.40–1.50)
GFR: 58.2 mL/min — ABNORMAL LOW (ref >60.00)
Glucose, Bld: 87 mg/dL (ref 70–99)
Potassium: 4.3 mEq/L (ref 3.5–5.1)
Sodium: 137 mEq/L (ref 135–145)

## 2018-01-31 LAB — LIPID PANEL
Cholesterol: 81 mg/dL (ref 0–200)
HDL: 32.6 mg/dL — ABNORMAL LOW (ref >39.00)
LDL CALC: 38 mg/dL (ref 0–99)
NonHDL: 48.82
Total CHOL/HDL Ratio: 2
Triglycerides: 54 mg/dL (ref 0.0–149.0)
VLDL: 10.8 mg/dL (ref 0.0–40.0)

## 2018-01-31 LAB — HEMOGLOBIN A1C: Hgb A1c MFr Bld: 7.3 % — ABNORMAL HIGH (ref 4.6–6.5)

## 2018-01-31 MED ORDER — ATORVASTATIN CALCIUM 80 MG PO TABS: 80.0000 mg | ORAL_TABLET | Freq: Every day | ORAL | 3 refills | Status: DC

## 2018-01-31 MED ORDER — CARVEDILOL 25 MG PO TABS: 25.0000 mg | ORAL_TABLET | Freq: Two times a day (BID) | ORAL | 3 refills | Status: DC

## 2018-01-31 MED ORDER — SITAGLIPTIN PHOSPHATE 100 MG PO TABS: 100.0000 mg | ORAL_TABLET | Freq: Every day | ORAL | 3 refills | Status: DC

## 2018-01-31 MED ORDER — ALLOPURINOL 300 MG PO TABS: 300.0000 mg | ORAL_TABLET | Freq: Every day | ORAL | 3 refills | Status: DC

## 2018-01-31 MED ORDER — HYDROCODONE-ACETAMINOPHEN 10-325 MG PO TABS: 1.0000 | ORAL_TABLET | Freq: Every day | ORAL | 0 refills | Status: DC | PRN

## 2018-01-31 MED ORDER — LISINOPRIL 2.5 MG PO TABS: 2.5000 mg | ORAL_TABLET | Freq: Every day | ORAL | 3 refills | Status: DC

## 2018-01-31 MED ORDER — ALBUTEROL SULFATE HFA 108 (90 BASE) MCG/ACT IN AERS: 2.0000 | INHALATION_SPRAY | Freq: Four times a day (QID) | RESPIRATORY_TRACT | 3 refills | Status: DC | PRN

## 2018-01-31 MED ORDER — METFORMIN HCL 500 MG PO TABS: ORAL_TABLET | ORAL | 3 refills | Status: DC

## 2018-01-31 MED ORDER — GABAPENTIN 300 MG PO CAPS: ORAL_CAPSULE | ORAL | 3 refills | Status: DC

## 2018-01-31 MED ORDER — POTASSIUM CHLORIDE CRYS ER 20 MEQ PO TBCR: 20.0000 meq | EXTENDED_RELEASE_TABLET | Freq: Every evening | ORAL | 3 refills | Status: DC

## 2018-01-31 MED ORDER — DONEPEZIL HCL 5 MG PO TABS: 5.0000 mg | ORAL_TABLET | Freq: Every day | ORAL | 3 refills | Status: DC

## 2018-01-31 MED ORDER — TAMSULOSIN HCL 0.4 MG PO CAPS: ORAL_CAPSULE | ORAL | 3 refills | Status: DC

## 2018-01-31 MED ORDER — GLIPIZIDE ER 10 MG PO TB24: 10.0000 mg | ORAL_TABLET | Freq: Every day | ORAL | 3 refills | Status: DC

## 2018-01-31 MED ORDER — FUROSEMIDE 40 MG PO TABS: 80.0000 mg | ORAL_TABLET | Freq: Two times a day (BID) | ORAL | 3 refills | Status: AC

## 2018-01-31 MED ORDER — BENZONATATE 100 MG PO CAPS: ORAL_CAPSULE | ORAL | 1 refills | Status: AC

## 2018-01-31 MED ORDER — OXYBUTYNIN CHLORIDE ER 10 MG PO TB24: 10.0000 mg | ORAL_TABLET | Freq: Every day | ORAL | 3 refills | Status: DC

## 2018-01-31 MED ORDER — SITAGLIPTIN PHOSPHATE 100 MG PO TABS: 100.0000 mg | ORAL_TABLET | Freq: Every day | ORAL | 3 refills | Status: AC

## 2018-01-31 NOTE — Progress Notes (Signed)
Subjective:    Patient ID: [name], male    DOB: 1946/01/06, 73 y.o.   MRN: [id number]  HPI 73 year old male with a history of atrial fibrillation on anticoagulation, BPH, coronary artery disease, hypertension hyperlipidemia, diabetes, presented to the hospital with complaints of left knee pain and shortness of breath, found to have COPD exacerbation possible early pna, with acute resp failure sats in the 80s, and AKI on lab eval.  Patient was admitted to the hospital and started on intravenous steroids, bronchodilators and antibiotics.  He was noted to be clinically dehydrated and started on IV fluids.  By the following day, renal function had improved.  He had mild elevation of troponin without any chest pain or EKG changes.  This was felt to be demand ischemia.  He overall improved, and transitioned to prednisone taper as well as a course of antibiotics.  Regarding his left knee pain, plain films indicated cortical irregularity indicating possible avulsion injury.  Case was discussed with Dr. Zenaida Deed, on-call for [facility] orthopedics who recommended a left knee immobilizer and follow-up with Dr. Lorin Mercy as an outpatient.  Follow-up appointment has been scheduled prior to discharge.  He was seen by physical therapy who recommended home health physical therapy, has not yet started.  Recommended for f/u labs today.  Asks for cough med prn, and pain med for knee.   Past Medical History:  Diagnosis Date  . Aortic root dilatation ([place]) 02/02/2011  . Arthritis    "lower back; going back down both my sciatic nerves"  . 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 BENIGN PROSTATIC HYPERTROPHY 08/29/2006   takes Flomax daily  . 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 Cataracts, bilateral    immature  . CHF (congestive heart failure) (HCC)    takes Lasix daily  . Chronic back pain    HNP   . CORONARY ARTERY DISEASE 08/29/2006   takes Coumadin daily  . DEPRESSION 08/29/2006  . DIABETES  MELLITUS, TYPE II 08/29/2006   takes Metformin,Januvia,and Glipizide  daily  . DIASTOLIC HEART FAILURE, CHRONIC 06/09/2008  . GOUT 04/22/2007   takes Allopurinol daily  . History of migraine    59yr ago  . HYPERLIPIDEMIA 08/29/2006   takes Atorvastatin daily  . HYPERTENSION 08/29/2006   takes Lisinopril daily  . INSOMNIA-SLEEP DISORDER-UNSPEC 10/23/2007  . Left lumbar radiculopathy 05/30/2010  . LUMBAR RADICULOPATHY, RIGHT 06/10/2007  . Muscle spasm    takes Zanaflex daily  . Myocardial infarction ([place] 12/27/10   "I've had several MIs"  . 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 PACEMAKER, PERMANENT 03/04/2008   pt denies this date  . Peripheral neuropathy    takes Gabapentin daily  . Presence of permanent cardiac pacemaker   . Shortness of breath dyspnea    "all my life" with exertion  . Sleep apnea    "if I lay flat I quit breathing; HOB up I'm fine"   Past Surgical History:  Procedure Laterality Date  . COLONOSCOPY    . CORONARY ANGIOPLASTY WITH STENT PLACEMENT  12/27/10   "I've had a total of 9 cardiac stents put in"  . CORONARY ARTERY BYPASS GRAFT  1992   CABG X 2  . ESOPHAGOGASTRODUODENOSCOPY N/A 12/01/2013   Procedure: ESOPHAGOGASTRODUODENOSCOPY (EGD);  Surgeon: DLafayette Dragon MD;  [facility];  Service: Endoscopy;  Laterality: N/A;  . INSERT / REPLACE / REMOVE PACEMAKER  ~ 2004   initial pacemaker placement  . INSERT / REPLACE / REMOVE PACEMAKER  10/2009  generator change  . LUMBAR LAMINECTOMY/DECOMPRESSION MICRODISCECTOMY Right 03/23/2014   Procedure: LAMINECTOMY AND FORAMINOTOMY RIGHT LUMBAR THREE-FOUR,LUMBAR FOUR-FIVE, LUMBAR FIVE-SACRAL ONE;  Surgeon: Charlie Pitter, MD;  Location: [place] [facility];  Service: Neurosurgery;  Laterality: Right;  right  . LUMBAR LAMINECTOMY/DECOMPRESSION MICRODISCECTOMY Left 12/01/2014   Procedure: Left Lumbar Three-Four, Lumbar Four-Five Laminectomy and Foraminotomy;  Surgeon: Earnie Larsson, MD;  Location: [place] [facility];  Service:  Neurosurgery;  Laterality: Left;  . s/p left arm surgury after work accident  1991   "2000# steel fell on it"  . s/p right hand surgury for foreign object  1970's   "piece of wood went in my hand; had to get that out"    reports that he quit smoking about 44 years ago. His smoking use included cigarettes. He has a 27.00 pack-year smoking history. He has quit using smokeless tobacco. He reports that he does not drink alcohol or use drugs. family history includes Coronary artery disease (age of onset: 18) in an other family member; Diabetes in his mother, sister, and another family member; Heart disease in his sister and sister; Lung cancer in his sister. Allergies  Allergen Reactions  . Crestor [Rosuvastatin Calcium] Other (See Comments)    Urination of blood  . Latex Rash and Other (See Comments)    Gloves cause welts on hands!!   Current Outpatient Medications on File Prior to Visit  Medication Sig Dispense Refill  . albuterol (PROVENTIL HFA;VENTOLIN HFA) 108 (90 Base) MCG/ACT inhaler Inhale 2 puffs into the lungs every 6 (six) hours as needed for wheezing or shortness of breath. 1 Inhaler 2  . allopurinol (ZYLOPRIM) 300 MG tablet TAKE 1 TABLET BY MOUTH ONCE DAILY 90 tablet 1  . atorvastatin (LIPITOR) 80 MG tablet Take 1 tablet (80 mg total) by mouth daily. 540 tablet 3  . Blood Glucose Monitoring Suppl (ONE TOUCH ULTRA 2) w/Device KIT Use as directed 1 each 3  . carvedilol (COREG) 25 MG tablet TAKE 1 TABLET BY MOUTH TWO  TIMES DAILY 180 tablet 2  . Cholecalciferol (VITAMIN D) 1000 UNITS capsule Take 1,000 Units by mouth 2 (two) times daily. Reported on 02/15/2015    . dextromethorphan-guaiFENesin (MUCINEX DM) 30-600 MG 12hr tablet Take 1 tablet by mouth 2 (two) times daily. 30 tablet 0  . donepezil (ARICEPT) 5 MG tablet Take 1 tablet (5 mg total) by mouth daily. 90 tablet 3  . fluticasone (FLONASE) 50 MCG/ACT nasal spray USE 2 SPRAYS IN EACH  NOSTRIL EVERY DAY 48 g 0  . furosemide (LASIX)  40 MG tablet Take 2 tablets (80 mg total) by mouth 2 (two) times daily. 540 tablet 2  . gabapentin (NEURONTIN) 300 MG capsule TAKE 2 CAPSULES BY MOUTH 3  TIMES DAILY 540 capsule 3  . glipiZIDE (GLUCOTROL XL) 10 MG 24 hr tablet TAKE 1 TABLET BY MOUTH  DAILY WITH BREAKFAST 90 tablet 2  . glucose blood (ONE TOUCH ULTRA TEST) test strip CHECK BLOOD SUGAR TWO TIMES DAILY 100 each 3  . HYDROcodone-acetaminophen (NORCO) 10-325 MG tablet Take 1 tablet by mouth daily as needed. 30 tablet 0  . lisinopril (PRINIVIL,ZESTRIL) 2.5 MG tablet TAKE 1 TABLET BY MOUTH  DAILY 90 tablet 1  . metFORMIN (GLUCOPHAGE) 500 MG tablet TAKE 2 TABLETS BY MOUTH TWO TIMES DAILY 360 tablet 2  . Multiple Vitamin (MULTIVITAMIN WITH MINERALS) TABS tablet Take 1 tablet by mouth every morning.    Glory Rosebush DELICA LANCETS 41O MISC USE AS DIRECTED THREE TIMES DAILY TO  CHECK BLOOD SUGAR 300 each 0  . oxybutynin (DITROPAN XL) 10 MG 24 hr tablet Take 1 tablet (10 mg total) by mouth at bedtime. 90 tablet 3  . potassium chloride SA (K-DUR,KLOR-CON) 20 MEQ tablet TAKE 1 TABLET BY MOUTH  EVERY EVENING 90 tablet 2  . predniSONE (DELTASONE) 20 MG tablet Take 2 tablets (40 mg total) by mouth daily. 10 tablet 0  . sitaGLIPtin (JANUVIA) 100 MG tablet Take 1 tablet (100 mg total) by mouth daily. 90 tablet 3  . tamsulosin (FLOMAX) 0.4 MG CAPS capsule TAKE 1 CAPSULE BY MOUTH  EVERY 12 HOURS 180 capsule 2  . tiZANidine (ZANAFLEX) 4 MG tablet TAKE 1 TABLET BY MOUTH   EVERY 6 HOURS AS NEEDED FOR MUSCLE SPASMS. 180 tablet 2  . warfarin (COUMADIN) 5 MG tablet Take 1/2 tablet daily except 1 tablet on Mondays, Wednesdays and Fridays (Patient taking differently: Take 2.5-5 mg by mouth See admin instructions. Take 1/2 tablet daily except 1 tablet on Mondays, Wednesdays and Fridays) 90 tablet 3  . warfarin (COUMADIN) 5 MG tablet Take 1/2 to 1 tablet by mouth daily as directed by coumadin clinic 90 tablet 1   No current facility-administered medications on file  prior to visit.     Review of Systems  Constitutional: Negative for other unusual diaphoresis or sweats HENT: Negative for ear discharge or swelling Eyes: Negative for other worsening visual disturbances Respiratory: Negative for stridor or other swelling  Gastrointestinal: Negative for worsening distension or other blood Genitourinary: Negative for retention or other urinary change Musculoskeletal: Negative for other MSK pain or swelling Skin: Negative for color change or other new lesions Neurological: Negative for worsening tremors and other numbness  Psychiatric/Behavioral: Negative for worsening agitation or other fatigue All other system neg per pt   Objective:   Physical Exam BP 120/64   Pulse 90   Temp 97.7 F (36.5 C) (Oral)   Ht 5' 11" (1.803 m)   Wt 202 lb (91.6 kg)   SpO2 92%   BMI 28.17 kg/m  VS noted,  Constitutional: Pt appears in NAD HENT: Head: NCAT.  Right Ear: External ear normal.  Left Ear: External ear normal.  Eyes: . Pupils are equal, round, and reactive to light. Conjunctivae and EOM are normal Nose: without d/c or deformity Neck: Neck supple. Gross normal ROM Cardiovascular: Normal rate and regular rhythm.   Pulmonary/Chest: Effort normal and breath sounds without rales or wheezing.  Abd:  Soft, NT, ND, + BS, no organomegaly Left knee without effusion. Neurological: Pt is alert. At baseline orientation, motor grossly intact Skin: Skin is warm. No rashes, other new lesions, no LE edema Psychiatric: Pt behavior is normal without agitation  No other exam findings Lab Results  Component Value Date   WBC 4.6 01/17/2018   HGB 8.4 (L) 01/17/2018   HCT 29.2 (L) 01/17/2018   PLT 132 (L) 01/17/2018   GLUCOSE 243 (H) 01/17/2018   CHOL 85 09/06/2017   TRIG 37.0 09/06/2017   HDL 40.70 09/06/2017   LDLCALC 37 09/06/2017   ALT 24 01/17/2018   AST 20 01/17/2018   NA 138 01/17/2018   K 4.0 01/17/2018   CL 104 01/17/2018   CREATININE 0.84 01/17/2018     BUN 26 (H) 01/17/2018   CO2 27 01/17/2018   TSH 1.85 09/06/2017   PSA 5.07 (H) 09/06/2017   INR 2.53 01/17/2018   HGBA1C 7.6 (H) 09/06/2017   MICROALBUR 3.1 (H) 09/24/2017       Assessment &  Plan:

## 2018-01-31 NOTE — Addendum Note (Signed)
Addended by: Juliet Rude on: 01/31/2018 11:05 AM   Modules accepted: Orders

## 2018-01-31 NOTE — Assessment & Plan Note (Signed)
stable overall by history and exam, recent data reviewed with pt, and pt to continue medical treatment as before,  to f/u any worsening symptoms or concerns  

## 2018-01-31 NOTE — Assessment & Plan Note (Signed)
Resolved,  to f/u any worsening symptoms or concerns  

## 2018-01-31 NOTE — Patient Instructions (Addendum)
Please take all new medication as prescribed - the pills for coughing  Please continue all other medications as before, and refills have been done if requested - the hydrocodone  Please have the pharmacy call with any other refills you may need.  Please continue your efforts at being more active, low cholesterol diet, and weight control  Please keep your appointments with your specialists as you may have planned - Dr Lorin Mercy as you have planned  Please go to the LAB in the Basement (turn left off the elevator) for the tests to be done today  You will be contacted by phone if any changes need to be made immediately.  Otherwise, you will receive a letter about your results with an explanation, but please check with MyChart first.  Please remember to sign up for MyChart if you have not done so, as this will be important to you in the future with finding out test results, communicating by private email, and scheduling acute appointments online when needed.  Please return in 6 months, or sooner if needed, with Lab testing done 3-5 days before

## 2018-01-31 NOTE — Assessment & Plan Note (Signed)
Resolved, cont albut inhaler prn

## 2018-02-01 ENCOUNTER — Other Ambulatory Visit: Payer: Self-pay | Admitting: Internal Medicine

## 2018-02-01 DIAGNOSIS — M7061 Trochanteric bursitis, right hip: Secondary | ICD-10-CM | POA: Diagnosis not present

## 2018-02-01 DIAGNOSIS — M5431 Sciatica, right side: Secondary | ICD-10-CM | POA: Diagnosis not present

## 2018-02-01 DIAGNOSIS — G4730 Sleep apnea, unspecified: Secondary | ICD-10-CM | POA: Diagnosis not present

## 2018-02-01 DIAGNOSIS — E1142 Type 2 diabetes mellitus with diabetic polyneuropathy: Secondary | ICD-10-CM | POA: Diagnosis not present

## 2018-02-01 DIAGNOSIS — E1122 Type 2 diabetes mellitus with diabetic chronic kidney disease: Secondary | ICD-10-CM | POA: Diagnosis not present

## 2018-02-01 DIAGNOSIS — Z792 Long term (current) use of antibiotics: Secondary | ICD-10-CM | POA: Diagnosis not present

## 2018-02-01 DIAGNOSIS — M5412 Radiculopathy, cervical region: Secondary | ICD-10-CM | POA: Diagnosis not present

## 2018-02-01 DIAGNOSIS — I130 Hypertensive heart and chronic kidney disease with heart failure and stage 1 through stage 4 chronic kidney disease, or unspecified chronic kidney disease: Secondary | ICD-10-CM | POA: Diagnosis not present

## 2018-02-01 DIAGNOSIS — I5032 Chronic diastolic (congestive) heart failure: Secondary | ICD-10-CM | POA: Diagnosis not present

## 2018-02-01 DIAGNOSIS — Z7984 Long term (current) use of oral hypoglycemic drugs: Secondary | ICD-10-CM | POA: Diagnosis not present

## 2018-02-01 DIAGNOSIS — M48062 Spinal stenosis, lumbar region with neurogenic claudication: Secondary | ICD-10-CM | POA: Diagnosis not present

## 2018-02-01 DIAGNOSIS — G8929 Other chronic pain: Secondary | ICD-10-CM | POA: Diagnosis not present

## 2018-02-01 DIAGNOSIS — M7062 Trochanteric bursitis, left hip: Secondary | ICD-10-CM | POA: Diagnosis not present

## 2018-02-01 DIAGNOSIS — I2510 Atherosclerotic heart disease of native coronary artery without angina pectoris: Secondary | ICD-10-CM | POA: Diagnosis not present

## 2018-02-01 DIAGNOSIS — J209 Acute bronchitis, unspecified: Secondary | ICD-10-CM | POA: Diagnosis not present

## 2018-02-01 DIAGNOSIS — J189 Pneumonia, unspecified organism: Secondary | ICD-10-CM | POA: Diagnosis not present

## 2018-02-01 DIAGNOSIS — M109 Gout, unspecified: Secondary | ICD-10-CM | POA: Diagnosis not present

## 2018-02-01 DIAGNOSIS — G43909 Migraine, unspecified, not intractable, without status migrainosus: Secondary | ICD-10-CM | POA: Diagnosis not present

## 2018-02-01 DIAGNOSIS — I4891 Unspecified atrial fibrillation: Secondary | ICD-10-CM | POA: Diagnosis not present

## 2018-02-01 DIAGNOSIS — M5416 Radiculopathy, lumbar region: Secondary | ICD-10-CM | POA: Diagnosis not present

## 2018-02-01 DIAGNOSIS — N182 Chronic kidney disease, stage 2 (mild): Secondary | ICD-10-CM | POA: Diagnosis not present

## 2018-02-01 DIAGNOSIS — Z7952 Long term (current) use of systemic steroids: Secondary | ICD-10-CM | POA: Diagnosis not present

## 2018-02-01 DIAGNOSIS — Z87891 Personal history of nicotine dependence: Secondary | ICD-10-CM | POA: Diagnosis not present

## 2018-02-01 DIAGNOSIS — Z7901 Long term (current) use of anticoagulants: Secondary | ICD-10-CM | POA: Diagnosis not present

## 2018-02-01 MED ORDER — ALBUTEROL SULFATE HFA 108 (90 BASE) MCG/ACT IN AERS: 2.0000 | INHALATION_SPRAY | Freq: Four times a day (QID) | RESPIRATORY_TRACT | 3 refills | Status: DC | PRN

## 2018-02-04 DIAGNOSIS — M109 Gout, unspecified: Secondary | ICD-10-CM | POA: Diagnosis not present

## 2018-02-04 DIAGNOSIS — I2510 Atherosclerotic heart disease of native coronary artery without angina pectoris: Secondary | ICD-10-CM | POA: Diagnosis not present

## 2018-02-04 DIAGNOSIS — M7061 Trochanteric bursitis, right hip: Secondary | ICD-10-CM | POA: Diagnosis not present

## 2018-02-04 DIAGNOSIS — Z7901 Long term (current) use of anticoagulants: Secondary | ICD-10-CM | POA: Diagnosis not present

## 2018-02-04 DIAGNOSIS — Z792 Long term (current) use of antibiotics: Secondary | ICD-10-CM | POA: Diagnosis not present

## 2018-02-04 DIAGNOSIS — M7062 Trochanteric bursitis, left hip: Secondary | ICD-10-CM | POA: Diagnosis not present

## 2018-02-04 DIAGNOSIS — Z7984 Long term (current) use of oral hypoglycemic drugs: Secondary | ICD-10-CM | POA: Diagnosis not present

## 2018-02-04 DIAGNOSIS — E1142 Type 2 diabetes mellitus with diabetic polyneuropathy: Secondary | ICD-10-CM | POA: Diagnosis not present

## 2018-02-04 DIAGNOSIS — G43909 Migraine, unspecified, not intractable, without status migrainosus: Secondary | ICD-10-CM | POA: Diagnosis not present

## 2018-02-04 DIAGNOSIS — G8929 Other chronic pain: Secondary | ICD-10-CM | POA: Diagnosis not present

## 2018-02-04 DIAGNOSIS — M5412 Radiculopathy, cervical region: Secondary | ICD-10-CM | POA: Diagnosis not present

## 2018-02-04 DIAGNOSIS — Z87891 Personal history of nicotine dependence: Secondary | ICD-10-CM | POA: Diagnosis not present

## 2018-02-04 DIAGNOSIS — M5431 Sciatica, right side: Secondary | ICD-10-CM | POA: Diagnosis not present

## 2018-02-04 DIAGNOSIS — J209 Acute bronchitis, unspecified: Secondary | ICD-10-CM | POA: Diagnosis not present

## 2018-02-04 DIAGNOSIS — Z7952 Long term (current) use of systemic steroids: Secondary | ICD-10-CM | POA: Diagnosis not present

## 2018-02-04 DIAGNOSIS — E1122 Type 2 diabetes mellitus with diabetic chronic kidney disease: Secondary | ICD-10-CM | POA: Diagnosis not present

## 2018-02-04 DIAGNOSIS — I4891 Unspecified atrial fibrillation: Secondary | ICD-10-CM | POA: Diagnosis not present

## 2018-02-04 DIAGNOSIS — G4730 Sleep apnea, unspecified: Secondary | ICD-10-CM | POA: Diagnosis not present

## 2018-02-04 DIAGNOSIS — I130 Hypertensive heart and chronic kidney disease with heart failure and stage 1 through stage 4 chronic kidney disease, or unspecified chronic kidney disease: Secondary | ICD-10-CM | POA: Diagnosis not present

## 2018-02-04 DIAGNOSIS — J189 Pneumonia, unspecified organism: Secondary | ICD-10-CM | POA: Diagnosis not present

## 2018-02-04 DIAGNOSIS — M5416 Radiculopathy, lumbar region: Secondary | ICD-10-CM | POA: Diagnosis not present

## 2018-02-04 DIAGNOSIS — I5032 Chronic diastolic (congestive) heart failure: Secondary | ICD-10-CM | POA: Diagnosis not present

## 2018-02-04 DIAGNOSIS — M48062 Spinal stenosis, lumbar region with neurogenic claudication: Secondary | ICD-10-CM | POA: Diagnosis not present

## 2018-02-04 DIAGNOSIS — N182 Chronic kidney disease, stage 2 (mild): Secondary | ICD-10-CM | POA: Diagnosis not present

## 2018-02-05 ENCOUNTER — Ambulatory Visit (INDEPENDENT_AMBULATORY_CARE_PROVIDER_SITE_OTHER): Payer: Medicare Other | Admitting: Orthopaedic Surgery

## 2018-02-05 ENCOUNTER — Other Ambulatory Visit: Payer: Self-pay | Admitting: Internal Medicine

## 2018-02-05 ENCOUNTER — Encounter (INDEPENDENT_AMBULATORY_CARE_PROVIDER_SITE_OTHER): Payer: Self-pay | Admitting: Orthopaedic Surgery

## 2018-02-05 VITALS — BP 99/61 | HR 68 | Ht 71.0 in | Wt 203.0 lb

## 2018-02-05 DIAGNOSIS — M659 Synovitis and tenosynovitis, unspecified: Secondary | ICD-10-CM

## 2018-02-05 DIAGNOSIS — M65862 Other synovitis and tenosynovitis, left lower leg: Secondary | ICD-10-CM | POA: Diagnosis not present

## 2018-02-05 DIAGNOSIS — M11262 Other chondrocalcinosis, left knee: Secondary | ICD-10-CM

## 2018-02-05 DIAGNOSIS — M1120 Other chondrocalcinosis, unspecified site: Secondary | ICD-10-CM

## 2018-02-05 MED ORDER — ALBUTEROL SULFATE HFA 108 (90 BASE) MCG/ACT IN AERS: 2.0000 | INHALATION_SPRAY | Freq: Four times a day (QID) | RESPIRATORY_TRACT | 3 refills | Status: AC | PRN

## 2018-02-05 MED ORDER — BUPIVACAINE HCL 0.5 % IJ SOLN
3.0000 mL | INTRAMUSCULAR | Status: AC | PRN
  Administered 2018-02-05: 3 mL via INTRA_ARTICULAR

## 2018-02-05 MED ORDER — METHYLPREDNISOLONE ACETATE 40 MG/ML IJ SUSP
40.0000 mg | INTRAMUSCULAR | Status: AC | PRN
  Administered 2018-02-05: 40 mg via INTRA_ARTICULAR

## 2018-02-05 MED ORDER — LIDOCAINE HCL 1 % IJ SOLN
0.5000 mL | INTRAMUSCULAR | Status: AC | PRN
  Administered 2018-02-05: .5 mL

## 2018-02-05 NOTE — Progress Notes (Signed)
Office Visit Note   Patient: [name]           Date of Birth: 21-Jan-1946           MRN: [id number] Visit Date: 02/05/2018              Requested by: Biagio Borg, MD [address] [place], [place] [address] PCP: Biagio Borg, MD   Assessment & Plan: Visit Diagnoses:  1. Chondrocalcinosis   2. Synovitis of left knee     Plan: Intra-articular injection performed left knee and watch her sugars carefully.  He has persistent problems she will call and we will proceed with MRI scan to rule out lateral meniscal tear with these chondrocalcinosis. Follow-Up Instructions: No follow-ups on file.   Orders:  No orders of the defined types were placed in this encounter.  No orders of the defined types were placed in this encounter.     Procedures: Large Joint Inj: L knee on 02/05/2018 8:51 AM Indications: joint swelling and pain Details: 22 G 1.5 in needle, anterolateral approach  Arthrogram: No  Medications: 0.5 mL lidocaine 1 %; 3 mL bupivacaine 0.5 %; 40 mg methylPREDNISolone acetate 40 MG/ML Outcome: tolerated well, no immediate complications Procedure, treatment alternatives, risks and benefits explained, specific risks discussed. Consent was given by the patient. Immediately prior to procedure a time out was called to verify the correct patient, procedure, equipment, support staff and site/side marked as required. Patient was prepped and draped in the usual sterile fashion.       Clinical Data: No additional findings.   Subjective: Chief Complaint  Patient presents with  . Left Knee - Pain    HPI 73 year old male recently in the hospital mid December for painful swollen left knee with synovitis unable to ambulate.  Doppler test was negative he had pulmonary problems desaturation and likely acute bronchitis treated with antibiotics steroids bronchodilators and improvement.  Prednisone orally helped with his knee he states he is having to use the cane he is  having lateral joint line pain and plain radiographs demonstrated some chondrocalcinosis of the meniscus.  He does have history of gout but is been taking allopurinol 300 mg.  He is continued to have lateral joint line pain but is not had any locking.  Radiographs demonstrated minimal joint space narrowing no significant degenerative arthritis problems.  There was some calcium laterally on the edge of the femoral condyle but this is more likely chondrocalcinosis than evulsion injury in the absence of any trauma.  Review of Systems team point systems updated unchanged from his admission December 2019.  Of note is prostate cancer type 2 diabetes on oral medications coronary artery disease atrial fib pulmonary problems with recent acute respiratory failure with hypoxia, atrial fib and cervical radiculopathy.   Objective: Vital Signs: BP 99/61   Pulse 68   Ht 5\' 11"  (1.803 m)   Wt 203 lb (92.1 kg)   BMI 28.31 kg/m   Physical Exam Constitutional:      Appearance: He is well-developed.  HENT:     Head: Normocephalic and atraumatic.  Eyes:     Pupils: Pupils are equal, round, and reactive to light.  Neck:     Thyroid: No thyromegaly.     Trachea: No tracheal deviation.  Cardiovascular:     Rate and Rhythm: Normal rate.  Pulmonary:     Effort: Pulmonary effort is normal.     Breath sounds: No wheezing.  Abdominal:  General: Bowel sounds are normal.     Palpations: Abdomen is soft.  Skin:    General: Skin is warm and dry.     Capillary Refill: Capillary refill takes less than 2 seconds.  Neurological:     Mental Status: He is alert and oriented to person, place, and time.  Psychiatric:        Behavior: Behavior normal.        Thought Content: Thought content normal.        Judgment: Judgment normal.     Ortho Exam patient has lateral joint line tenderness some pain with hyperextension lacks 5 degrees reaching full extension he can ambulates with a cane with 2+ synovitis left  knee.  Mild patellofemoral crepitus with flexion-extension and extension of his knee collateral ligaments are stable no pain with lateral collateral stressing.  ACL PCL exam is normal.  No distal edema is noted. Specialty Comments:  No specialty comments available.  Imaging: No results found.   PMFS History: Patient Active Problem List   Diagnosis Date Noted  . Chondrocalcinosis 02/05/2018  . Acute respiratory failure with hypoxia ([place]) 01/16/2018  . Elevated troponin 01/16/2018  . Weakness 12/21/2017  . Hypersomnolence 09/20/2017  . Acute renal failure superimposed on stage 2 chronic kidney disease ([place]) 06/16/2017  . Left cervical radiculopathy 02/07/2017  . Trochanteric bursitis, right hip 01/01/2017  . Right hip pain 12/26/2016  . Knee instability, right 11/03/2016  . Urinary frequency 10/20/2016  . Right foot pain 09/29/2016  . Prepatellar bursitis 06/29/2016  . Left knee pain 06/28/2016  . Ingrown nail 06/28/2016  . Acute gouty arthritis 05/07/2016  . Left shoulder pain 02/09/2016  . Memory loss 01/19/2016  . Ingrown toenail 09/13/2015  . Renal insufficiency 09/08/2015  . Right leg swelling 07/13/2015  . Leg wound, right 06/15/2015  . Rash 06/06/2015  . Anemia 05/24/2015  . Cellulitis of leg, right 05/13/2015  . Greater trochanteric bursitis of left hip 08/12/2014  . Left leg pain 07/15/2014  . 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 Lumbar stenosis with neurogenic claudication 03/23/2014  . Long term current use of anticoagulant therapy 02/27/2014  . Dysphagia, pharyngoesophageal phase 12/01/2013  . 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 Dysphagia 11/11/2013  . Lumbago 11/03/2013  . Low back pain radiating to right leg 11/03/2013  . Abnormality of gait 11/03/2013  . Difficulty walking 11/03/2013  . Loss of weight 10/29/2013  . Atrial fibrillation ([place]) 08/26/2013  . Chest pain 04/05/2013  . Plantar fasciitis  of right foot 02/18/2013  . Bruit 08/21/2012  . Sciatica of left side 08/15/2012  . Left hip pain 08/15/2012  . Headache(784.0) 05/03/2012  . Acute bronchitis 12/14/2011  . Increased prostate specific antigen (PSA) velocity 11/11/2011  . Right sided sciatica 11/10/2011  . Chronic low back pain 05/03/2011  . Aortic root dilatation ([place]) 02/02/2011  . Wheezing without diagnosis of asthma 02/02/2011  . Syncope 12/27/2010  . Left lumbar radiculopathy 05/30/2010  . Wellness examination 05/30/2010  . 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 DOE (dyspnea on exertion) 06/10/2008  . DIASTOLIC HEART FAILURE, CHRONIC 06/09/2008  . KNEE PAIN, BILATERAL 04/21/2008  . LEG PAIN, BILATERAL 04/21/2008  . 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 PACEMAKER, PERMANENT 03/04/2008  . INSOMNIA-SLEEP DISORDER-UNSPEC 10/23/2007  . Lumbar radiculopathy, chronic 06/10/2007  . GOUT 04/22/2007  . 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 ERECTILE DYSFUNCTION 11/26/2006  . ALLERGIC RHINITIS 11/26/2006  . Type 2 diabetes, uncontrolled, with neuropathy (  [place]) 08/29/2006  . Hyperlipidemia 08/29/2006  . Overweight(278.02) 08/29/2006  . DEPRESSION 08/29/2006  . Essential hypertension 08/29/2006  . CORONARY ARTERY DISEASE 08/29/2006  . BENIGN PROSTATIC HYPERTROPHY 08/29/2006   Past Medical History:  Diagnosis Date  . Aortic root dilatation ([place]) 02/02/2011  . Arthritis    "lower back; going back down both my sciatic nerves"  . 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 BENIGN PROSTATIC HYPERTROPHY 08/29/2006   takes Flomax daily  . 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 Cataracts, bilateral    immature  . CHF (congestive heart failure) ([facility])    takes Lasix daily  . Chronic back pain    HNP   . CORONARY ARTERY DISEASE 08/29/2006   takes Coumadin daily  . DEPRESSION 08/29/2006  . DIABETES MELLITUS, TYPE II 08/29/2006   takes Metformin,Januvia,and Glipizide  daily  . DIASTOLIC  HEART FAILURE, CHRONIC 06/09/2008  . GOUT 04/22/2007   takes Allopurinol daily  . History of migraine    79yrs ago  . HYPERLIPIDEMIA 08/29/2006   takes Atorvastatin daily  . HYPERTENSION 08/29/2006   takes Lisinopril daily  . INSOMNIA-SLEEP DISORDER-UNSPEC 10/23/2007  . Left lumbar radiculopathy 05/30/2010  . LUMBAR RADICULOPATHY, RIGHT 06/10/2007  . Muscle spasm    takes Zanaflex daily  . Myocardial infarction ([place]) 12/27/10   "I've had several MIs"  . 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 PACEMAKER, PERMANENT 03/04/2008   pt denies this date  . Peripheral neuropathy    takes Gabapentin daily  . Presence of permanent cardiac pacemaker   . Shortness of breath dyspnea    "all my life" with exertion  . Sleep apnea    "if I lay flat I quit breathing; HOB up I'm fine"    Family History  Problem Relation Age of Onset  . Diabetes Mother   . Diabetes Sister   . Heart disease Sister        2 sister died with heart disease  . Coronary artery disease Other 90       male, first degree relative  . Diabetes Other        1st degree relative  . Heart disease Sister   . Lung cancer Sister        deceased    Past Surgical History:  Procedure Laterality Date  . COLONOSCOPY    . CORONARY ANGIOPLASTY WITH STENT PLACEMENT  12/27/10   "I've had a total of 9 cardiac stents put in"  . CORONARY ARTERY BYPASS GRAFT  1992   CABG X 2  . ESOPHAGOGASTRODUODENOSCOPY N/A 12/01/2013   Procedure: ESOPHAGOGASTRODUODENOSCOPY (EGD);  Surgeon: Lafayette Dragon, MD;  [facility];  Service: Endoscopy;  Laterality: N/A;  . INSERT / REPLACE / REMOVE PACEMAKER  ~ 2004   initial pacemaker placement  . INSERT / REPLACE / REMOVE PACEMAKER  10/2009   generator change  . LUMBAR LAMINECTOMY/DECOMPRESSION MICRODISCECTOMY Right 03/23/2014   Procedure: LAMINECTOMY AND FORAMINOTOMY RIGHT LUMBAR THREE-FOUR,LUMBAR FOUR-FIVE, LUMBAR FIVE-SACRAL ONE;  Surgeon: Charlie Pitter, MD;  Location: [facility];  Service:  Neurosurgery;  Laterality: Right;  right  . LUMBAR LAMINECTOMY/DECOMPRESSION MICRODISCECTOMY Left 12/01/2014   Procedure: Left Lumbar Three-Four, Lumbar Four-Five Laminectomy and Foraminotomy;  Surgeon: Earnie Larsson, MD;  Location: [facility];  Service: Neurosurgery;  Laterality: Left;  . s/p left arm surgury after work accident  1991   "2000# steel fell on it"  . s/p right hand surgury for foreign object  1970's   "piece of wood went  in my hand; had to get that out"   Social History   Occupational History  . Occupation: prior work Designer, industrial/product: UNEMPLOYED  . Occupation: disabled since 2004  Tobacco Use  . Smoking status: Former Smoker    Packs/day: 3.00    Years: 9.00    Pack years: 27.00    Types: Cigarettes    Last attempt to quit: 02/26/1973    Years since quitting: 44.9  . Smokeless tobacco: Former Systems developer  . Tobacco comment: quit smoking 60yrs ago  Substance and Sexual Activity  . Alcohol use: No    Alcohol/week: 0.0 standard drinks  . Drug use: No    Comment: "used pouches of tobacco for a long time; quit those 12/09/1970"  . Sexual activity: Yes

## 2018-02-06 DIAGNOSIS — I5032 Chronic diastolic (congestive) heart failure: Secondary | ICD-10-CM | POA: Diagnosis not present

## 2018-02-06 DIAGNOSIS — N182 Chronic kidney disease, stage 2 (mild): Secondary | ICD-10-CM | POA: Diagnosis not present

## 2018-02-06 DIAGNOSIS — M109 Gout, unspecified: Secondary | ICD-10-CM | POA: Diagnosis not present

## 2018-02-06 DIAGNOSIS — M5431 Sciatica, right side: Secondary | ICD-10-CM | POA: Diagnosis not present

## 2018-02-06 DIAGNOSIS — I2510 Atherosclerotic heart disease of native coronary artery without angina pectoris: Secondary | ICD-10-CM | POA: Diagnosis not present

## 2018-02-06 DIAGNOSIS — Z7901 Long term (current) use of anticoagulants: Secondary | ICD-10-CM | POA: Diagnosis not present

## 2018-02-06 DIAGNOSIS — J209 Acute bronchitis, unspecified: Secondary | ICD-10-CM | POA: Diagnosis not present

## 2018-02-06 DIAGNOSIS — J189 Pneumonia, unspecified organism: Secondary | ICD-10-CM | POA: Diagnosis not present

## 2018-02-06 DIAGNOSIS — I130 Hypertensive heart and chronic kidney disease with heart failure and stage 1 through stage 4 chronic kidney disease, or unspecified chronic kidney disease: Secondary | ICD-10-CM | POA: Diagnosis not present

## 2018-02-06 DIAGNOSIS — G4730 Sleep apnea, unspecified: Secondary | ICD-10-CM | POA: Diagnosis not present

## 2018-02-06 DIAGNOSIS — I4891 Unspecified atrial fibrillation: Secondary | ICD-10-CM | POA: Diagnosis not present

## 2018-02-06 DIAGNOSIS — Z87891 Personal history of nicotine dependence: Secondary | ICD-10-CM | POA: Diagnosis not present

## 2018-02-06 DIAGNOSIS — E1142 Type 2 diabetes mellitus with diabetic polyneuropathy: Secondary | ICD-10-CM | POA: Diagnosis not present

## 2018-02-06 DIAGNOSIS — M48062 Spinal stenosis, lumbar region with neurogenic claudication: Secondary | ICD-10-CM | POA: Diagnosis not present

## 2018-02-06 DIAGNOSIS — Z7952 Long term (current) use of systemic steroids: Secondary | ICD-10-CM | POA: Diagnosis not present

## 2018-02-06 DIAGNOSIS — M7062 Trochanteric bursitis, left hip: Secondary | ICD-10-CM | POA: Diagnosis not present

## 2018-02-06 DIAGNOSIS — Z792 Long term (current) use of antibiotics: Secondary | ICD-10-CM | POA: Diagnosis not present

## 2018-02-06 DIAGNOSIS — M5412 Radiculopathy, cervical region: Secondary | ICD-10-CM | POA: Diagnosis not present

## 2018-02-06 DIAGNOSIS — E1122 Type 2 diabetes mellitus with diabetic chronic kidney disease: Secondary | ICD-10-CM | POA: Diagnosis not present

## 2018-02-06 DIAGNOSIS — Z7984 Long term (current) use of oral hypoglycemic drugs: Secondary | ICD-10-CM | POA: Diagnosis not present

## 2018-02-06 DIAGNOSIS — G43909 Migraine, unspecified, not intractable, without status migrainosus: Secondary | ICD-10-CM | POA: Diagnosis not present

## 2018-02-06 DIAGNOSIS — M7061 Trochanteric bursitis, right hip: Secondary | ICD-10-CM | POA: Diagnosis not present

## 2018-02-06 DIAGNOSIS — M5416 Radiculopathy, lumbar region: Secondary | ICD-10-CM | POA: Diagnosis not present

## 2018-02-06 DIAGNOSIS — G8929 Other chronic pain: Secondary | ICD-10-CM | POA: Diagnosis not present

## 2018-02-11 DIAGNOSIS — E1142 Type 2 diabetes mellitus with diabetic polyneuropathy: Secondary | ICD-10-CM | POA: Diagnosis not present

## 2018-02-11 DIAGNOSIS — G43909 Migraine, unspecified, not intractable, without status migrainosus: Secondary | ICD-10-CM | POA: Diagnosis not present

## 2018-02-11 DIAGNOSIS — M5412 Radiculopathy, cervical region: Secondary | ICD-10-CM | POA: Diagnosis not present

## 2018-02-11 DIAGNOSIS — M48062 Spinal stenosis, lumbar region with neurogenic claudication: Secondary | ICD-10-CM | POA: Diagnosis not present

## 2018-02-11 DIAGNOSIS — N182 Chronic kidney disease, stage 2 (mild): Secondary | ICD-10-CM | POA: Diagnosis not present

## 2018-02-11 DIAGNOSIS — J189 Pneumonia, unspecified organism: Secondary | ICD-10-CM | POA: Diagnosis not present

## 2018-02-11 DIAGNOSIS — M5416 Radiculopathy, lumbar region: Secondary | ICD-10-CM | POA: Diagnosis not present

## 2018-02-11 DIAGNOSIS — Z7984 Long term (current) use of oral hypoglycemic drugs: Secondary | ICD-10-CM | POA: Diagnosis not present

## 2018-02-11 DIAGNOSIS — M5431 Sciatica, right side: Secondary | ICD-10-CM | POA: Diagnosis not present

## 2018-02-11 DIAGNOSIS — M7061 Trochanteric bursitis, right hip: Secondary | ICD-10-CM | POA: Diagnosis not present

## 2018-02-11 DIAGNOSIS — M109 Gout, unspecified: Secondary | ICD-10-CM | POA: Diagnosis not present

## 2018-02-11 DIAGNOSIS — Z7952 Long term (current) use of systemic steroids: Secondary | ICD-10-CM | POA: Diagnosis not present

## 2018-02-11 DIAGNOSIS — I5032 Chronic diastolic (congestive) heart failure: Secondary | ICD-10-CM | POA: Diagnosis not present

## 2018-02-11 DIAGNOSIS — E1122 Type 2 diabetes mellitus with diabetic chronic kidney disease: Secondary | ICD-10-CM | POA: Diagnosis not present

## 2018-02-11 DIAGNOSIS — I4891 Unspecified atrial fibrillation: Secondary | ICD-10-CM | POA: Diagnosis not present

## 2018-02-11 DIAGNOSIS — I130 Hypertensive heart and chronic kidney disease with heart failure and stage 1 through stage 4 chronic kidney disease, or unspecified chronic kidney disease: Secondary | ICD-10-CM | POA: Diagnosis not present

## 2018-02-11 DIAGNOSIS — I2510 Atherosclerotic heart disease of native coronary artery without angina pectoris: Secondary | ICD-10-CM | POA: Diagnosis not present

## 2018-02-11 DIAGNOSIS — G4730 Sleep apnea, unspecified: Secondary | ICD-10-CM | POA: Diagnosis not present

## 2018-02-11 DIAGNOSIS — J209 Acute bronchitis, unspecified: Secondary | ICD-10-CM | POA: Diagnosis not present

## 2018-02-11 DIAGNOSIS — G8929 Other chronic pain: Secondary | ICD-10-CM | POA: Diagnosis not present

## 2018-02-11 DIAGNOSIS — Z87891 Personal history of nicotine dependence: Secondary | ICD-10-CM | POA: Diagnosis not present

## 2018-02-11 DIAGNOSIS — Z792 Long term (current) use of antibiotics: Secondary | ICD-10-CM | POA: Diagnosis not present

## 2018-02-11 DIAGNOSIS — M7062 Trochanteric bursitis, left hip: Secondary | ICD-10-CM | POA: Diagnosis not present

## 2018-02-11 DIAGNOSIS — Z7901 Long term (current) use of anticoagulants: Secondary | ICD-10-CM | POA: Diagnosis not present

## 2018-02-15 DIAGNOSIS — M109 Gout, unspecified: Secondary | ICD-10-CM | POA: Diagnosis not present

## 2018-02-15 DIAGNOSIS — I5032 Chronic diastolic (congestive) heart failure: Secondary | ICD-10-CM | POA: Diagnosis not present

## 2018-02-15 DIAGNOSIS — M7061 Trochanteric bursitis, right hip: Secondary | ICD-10-CM | POA: Diagnosis not present

## 2018-02-15 DIAGNOSIS — I2510 Atherosclerotic heart disease of native coronary artery without angina pectoris: Secondary | ICD-10-CM | POA: Diagnosis not present

## 2018-02-15 DIAGNOSIS — M5431 Sciatica, right side: Secondary | ICD-10-CM | POA: Diagnosis not present

## 2018-02-15 DIAGNOSIS — Z7901 Long term (current) use of anticoagulants: Secondary | ICD-10-CM | POA: Diagnosis not present

## 2018-02-15 DIAGNOSIS — Z87891 Personal history of nicotine dependence: Secondary | ICD-10-CM | POA: Diagnosis not present

## 2018-02-15 DIAGNOSIS — I130 Hypertensive heart and chronic kidney disease with heart failure and stage 1 through stage 4 chronic kidney disease, or unspecified chronic kidney disease: Secondary | ICD-10-CM | POA: Diagnosis not present

## 2018-02-15 DIAGNOSIS — M5416 Radiculopathy, lumbar region: Secondary | ICD-10-CM | POA: Diagnosis not present

## 2018-02-15 DIAGNOSIS — M5412 Radiculopathy, cervical region: Secondary | ICD-10-CM | POA: Diagnosis not present

## 2018-02-15 DIAGNOSIS — G43909 Migraine, unspecified, not intractable, without status migrainosus: Secondary | ICD-10-CM | POA: Diagnosis not present

## 2018-02-15 DIAGNOSIS — I4891 Unspecified atrial fibrillation: Secondary | ICD-10-CM | POA: Diagnosis not present

## 2018-02-15 DIAGNOSIS — Z7952 Long term (current) use of systemic steroids: Secondary | ICD-10-CM | POA: Diagnosis not present

## 2018-02-15 DIAGNOSIS — E1142 Type 2 diabetes mellitus with diabetic polyneuropathy: Secondary | ICD-10-CM | POA: Diagnosis not present

## 2018-02-15 DIAGNOSIS — N182 Chronic kidney disease, stage 2 (mild): Secondary | ICD-10-CM | POA: Diagnosis not present

## 2018-02-15 DIAGNOSIS — M48062 Spinal stenosis, lumbar region with neurogenic claudication: Secondary | ICD-10-CM | POA: Diagnosis not present

## 2018-02-15 DIAGNOSIS — G8929 Other chronic pain: Secondary | ICD-10-CM | POA: Diagnosis not present

## 2018-02-15 DIAGNOSIS — G4730 Sleep apnea, unspecified: Secondary | ICD-10-CM | POA: Diagnosis not present

## 2018-02-15 DIAGNOSIS — Z792 Long term (current) use of antibiotics: Secondary | ICD-10-CM | POA: Diagnosis not present

## 2018-02-15 DIAGNOSIS — J189 Pneumonia, unspecified organism: Secondary | ICD-10-CM | POA: Diagnosis not present

## 2018-02-15 DIAGNOSIS — M7062 Trochanteric bursitis, left hip: Secondary | ICD-10-CM | POA: Diagnosis not present

## 2018-02-15 DIAGNOSIS — Z7984 Long term (current) use of oral hypoglycemic drugs: Secondary | ICD-10-CM | POA: Diagnosis not present

## 2018-02-15 DIAGNOSIS — E1122 Type 2 diabetes mellitus with diabetic chronic kidney disease: Secondary | ICD-10-CM | POA: Diagnosis not present

## 2018-02-15 DIAGNOSIS — J209 Acute bronchitis, unspecified: Secondary | ICD-10-CM | POA: Diagnosis not present

## 2018-02-18 ENCOUNTER — Ambulatory Visit (INDEPENDENT_AMBULATORY_CARE_PROVIDER_SITE_OTHER): Payer: Medicare Other | Admitting: Pharmacist

## 2018-02-18 DIAGNOSIS — I483 Typical atrial flutter: Secondary | ICD-10-CM

## 2018-02-18 DIAGNOSIS — Z7901 Long term (current) use of anticoagulants: Secondary | ICD-10-CM

## 2018-02-18 DIAGNOSIS — I4891 Unspecified atrial fibrillation: Secondary | ICD-10-CM | POA: Diagnosis not present

## 2018-02-18 LAB — POCT INR: INR: 3 (ref 2.0–3.0)

## 2018-03-06 DIAGNOSIS — F329 Major depressive disorder, single episode, unspecified: Secondary | ICD-10-CM

## 2018-03-06 DIAGNOSIS — E1142 Type 2 diabetes mellitus with diabetic polyneuropathy: Secondary | ICD-10-CM | POA: Diagnosis not present

## 2018-03-06 DIAGNOSIS — G8929 Other chronic pain: Secondary | ICD-10-CM

## 2018-03-06 DIAGNOSIS — N182 Chronic kidney disease, stage 2 (mild): Secondary | ICD-10-CM

## 2018-03-06 DIAGNOSIS — Z9181 History of falling: Secondary | ICD-10-CM

## 2018-03-06 DIAGNOSIS — J209 Acute bronchitis, unspecified: Secondary | ICD-10-CM | POA: Diagnosis not present

## 2018-03-06 DIAGNOSIS — M5416 Radiculopathy, lumbar region: Secondary | ICD-10-CM

## 2018-03-06 DIAGNOSIS — Z7984 Long term (current) use of oral hypoglycemic drugs: Secondary | ICD-10-CM

## 2018-03-06 DIAGNOSIS — M109 Gout, unspecified: Secondary | ICD-10-CM

## 2018-03-06 DIAGNOSIS — E1122 Type 2 diabetes mellitus with diabetic chronic kidney disease: Secondary | ICD-10-CM

## 2018-03-06 DIAGNOSIS — I2510 Atherosclerotic heart disease of native coronary artery without angina pectoris: Secondary | ICD-10-CM

## 2018-03-06 DIAGNOSIS — Z87891 Personal history of nicotine dependence: Secondary | ICD-10-CM

## 2018-03-06 DIAGNOSIS — M5412 Radiculopathy, cervical region: Secondary | ICD-10-CM

## 2018-03-06 DIAGNOSIS — Z7901 Long term (current) use of anticoagulants: Secondary | ICD-10-CM

## 2018-03-06 DIAGNOSIS — I5032 Chronic diastolic (congestive) heart failure: Secondary | ICD-10-CM

## 2018-03-06 DIAGNOSIS — J189 Pneumonia, unspecified organism: Secondary | ICD-10-CM | POA: Diagnosis not present

## 2018-03-06 DIAGNOSIS — Z7952 Long term (current) use of systemic steroids: Secondary | ICD-10-CM

## 2018-03-06 DIAGNOSIS — G4730 Sleep apnea, unspecified: Secondary | ICD-10-CM

## 2018-03-06 DIAGNOSIS — Z792 Long term (current) use of antibiotics: Secondary | ICD-10-CM

## 2018-03-06 DIAGNOSIS — M7062 Trochanteric bursitis, left hip: Secondary | ICD-10-CM

## 2018-03-06 DIAGNOSIS — M48062 Spinal stenosis, lumbar region with neurogenic claudication: Secondary | ICD-10-CM | POA: Diagnosis not present

## 2018-03-06 DIAGNOSIS — M7061 Trochanteric bursitis, right hip: Secondary | ICD-10-CM

## 2018-03-06 DIAGNOSIS — M5431 Sciatica, right side: Secondary | ICD-10-CM

## 2018-03-06 DIAGNOSIS — I130 Hypertensive heart and chronic kidney disease with heart failure and stage 1 through stage 4 chronic kidney disease, or unspecified chronic kidney disease: Secondary | ICD-10-CM | POA: Diagnosis not present

## 2018-03-06 DIAGNOSIS — G43909 Migraine, unspecified, not intractable, without status migrainosus: Secondary | ICD-10-CM

## 2018-03-06 DIAGNOSIS — I4891 Unspecified atrial fibrillation: Secondary | ICD-10-CM

## 2018-03-07 ENCOUNTER — Encounter: Payer: Self-pay | Admitting: Internal Medicine

## 2018-03-07 ENCOUNTER — Ambulatory Visit: Payer: Medicare Other | Admitting: Internal Medicine

## 2018-03-07 VITALS — BP 120/60 | HR 84 | Ht 71.0 in | Wt 213.2 lb

## 2018-03-07 DIAGNOSIS — I5032 Chronic diastolic (congestive) heart failure: Secondary | ICD-10-CM | POA: Diagnosis not present

## 2018-03-07 DIAGNOSIS — I4891 Unspecified atrial fibrillation: Secondary | ICD-10-CM | POA: Diagnosis not present

## 2018-03-07 DIAGNOSIS — I442 Atrioventricular block, complete: Secondary | ICD-10-CM | POA: Diagnosis not present

## 2018-03-07 NOTE — Patient Instructions (Signed)
Medication Instructions:  Your physician recommends that you continue on your current medications as directed. Please refer to the Current Medication list given to you today.  If you need a refill on your cardiac medications before your next appointment, please call your pharmacy.   Lab work: NONE   If you have labs (blood work) drawn today and your tests are completely normal, you will receive your results only by: . MyChart Message (if you have MyChart) OR . A paper copy in the mail If you have any lab test that is abnormal or we need to change your treatment, we will call you to review the results.  Testing/Procedures: NONE   Follow-Up: At [facility], you and your health needs are our priority.  As part of our continuing mission to provide you with exceptional heart care, we have created designated Provider Care Teams.  These Care Teams include your primary Cardiologist (physician) and Advanced Practice Providers (APPs -  Physician Assistants and Nurse Practitioners) who all work together to provide you with the care you need, when you need it. You will need a follow up appointment in 1 years.  Please call our office 2 months in advance to schedule this appointment.  You may see Gregg Taylor, MD or one of the following Advanced Practice Providers on your designated Care Team:   Amber Seiler, NP . Renee Ursuy, PA-C  Any Other Special Instructions Will Be Listed Below (If Applicable). Thank you for choosing [facility]!     

## 2018-03-07 NOTE — Progress Notes (Signed)
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  In the interim he has been stable.  He denies anginal symptoms.  He admits to some dyspnea with exertion. He has some peripheral edema.  He remains active taking care of his wife who is been ill. Allergies  Allergen Reactions  . Crestor [Rosuvastatin Calcium] Other (See Comments)    Urination of blood  . Latex Rash and Other (See Comments)    Gloves cause welts on hands!!     Current Outpatient Medications  Medication Sig Dispense Refill  . albuterol (PROAIR HFA) 108 (90 Base) MCG/ACT inhaler Inhale 2 puffs into the lungs every 6 (six) hours as needed for wheezing or shortness of breath. 3 Inhaler 3  . allopurinol (ZYLOPRIM) 300 MG tablet Take 1 tablet (300 mg total) by mouth daily. 90 tablet 3  . atorvastatin (LIPITOR) 80 MG tablet Take 1 tablet (80 mg total) by mouth daily. 90 tablet 3  . benzonatate (TESSALON PERLES) 100 MG capsule 1-2 tab by mouth every 6 hrs as needed for cough 60 capsule 1  . Blood Glucose Monitoring Suppl (ONE TOUCH ULTRA 2) w/Device KIT Use as directed 1 each 3  . carvedilol (COREG) 25 MG tablet Take 1 tablet (25 mg total) by mouth 2 (two) times daily. 180 tablet 3  . Cholecalciferol (VITAMIN D) 1000 UNITS capsule Take 1,000 Units by mouth 2 (two) times daily. Reported on 02/15/2015    . dextromethorphan-guaiFENesin (MUCINEX DM) 30-600 MG 12hr tablet Take 1 tablet by mouth 2 (two) times daily. 30 tablet 0  . donepezil (ARICEPT) 5 MG tablet Take 1 tablet (5 mg total) by mouth daily. 90 tablet 3  . fluticasone (FLONASE) 50 MCG/ACT nasal spray USE 2 SPRAYS IN EACH  NOSTRIL EVERY DAY 48 g 0  . furosemide (LASIX) 40 MG tablet Take 2 tablets (80 mg total) by mouth 2 (two) times daily. 720 tablet 3  . gabapentin (NEURONTIN) 300 MG capsule TAKE 2 CAPSULES BY MOUTH 3  TIMES  DAILY 540 capsule 3  . glipiZIDE (GLUCOTROL XL) 10 MG 24 hr tablet Take 1 tablet (10 mg total) by mouth daily with breakfast. 90 tablet 3  . glucose blood (ONE TOUCH ULTRA TEST) test strip CHECK BLOOD SUGAR TWO TIMES DAILY 100 each 3  . HYDROcodone-acetaminophen (NORCO) 10-325 MG tablet Take 1 tablet by mouth daily as needed. 30 tablet 0  . lisinopril (PRINIVIL,ZESTRIL) 2.5 MG tablet Take 1 tablet (2.5 mg total) by mouth daily. 90 tablet 3  . metFORMIN (GLUCOPHAGE) 500 MG tablet 4 tab by mouth daily 360 tablet 3  . Multiple Vitamin (MULTIVITAMIN WITH MINERALS) TABS tablet Take 1 tablet by mouth every morning.    Glory Rosebush DELICA LANCETS 30Q MISC USE AS DIRECTED THREE TIMES DAILY TO CHECK BLOOD SUGAR 300 each 0  . oxybutynin (DITROPAN XL) 10 MG 24 hr tablet Take 1 tablet (10 mg total) by mouth at bedtime. 90 tablet 3  . potassium chloride SA (K-DUR,KLOR-CON) 20 MEQ tablet Take 1 tablet (20 mEq total) by mouth every evening. 90 tablet 3  . sitaGLIPtin (JANUVIA) 100 MG tablet Take 1 tablet (100 mg total) by mouth daily. 90 tablet 3  . tamsulosin (FLOMAX) 0.4 MG CAPS capsule 1 tab by mouth twice per day 180 capsule 3  . tiZANidine (ZANAFLEX) 4 MG tablet TAKE 1 TABLET BY MOUTH  EVERY 6 HOURS AS NEEDED FOR MUSCLE SPASMS. 180 tablet 2  . warfarin (COUMADIN) 5 MG tablet Take 1/2 tablet daily except 1 tablet on Mondays, Wednesdays and Fridays (Patient taking differently: Take 2.5-5 mg by mouth See admin instructions. Take 1/2 tablet daily except 1 tablet on Mondays, Wednesdays and Fridays) 90 tablet 3   No current facility-administered medications for this visit.      Past Medical History:  Diagnosis Date  . Aortic root dilatation ([place]) 02/02/2011  . Arthritis    "lower back; going back down both my sciatic nerves"  . Atrioventricular block, complete ([place]) 09/04/2008  . BENIGN PROSTATIC HYPERTROPHY 08/29/2006   takes Flomax daily  . 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 Cataracts, bilateral     immature  . CHF (congestive heart failure) ([facility])    takes Lasix daily  . Chronic back pain    HNP   . CORONARY ARTERY DISEASE 08/29/2006   takes Coumadin daily  . DEPRESSION 08/29/2006  . DIABETES MELLITUS, TYPE II 08/29/2006   takes Metformin,Januvia,and Glipizide  daily  . DIASTOLIC HEART FAILURE, CHRONIC 06/09/2008  . GOUT 04/22/2007   takes Allopurinol daily  . History of migraine    4yr ago  . HYPERLIPIDEMIA 08/29/2006   takes Atorvastatin daily  . HYPERTENSION 08/29/2006   takes Lisinopril daily  . INSOMNIA-SLEEP DISORDER-UNSPEC 10/23/2007  . Left lumbar radiculopathy 05/30/2010  . LUMBAR RADICULOPATHY, RIGHT 06/10/2007  . Muscle spasm    takes Zanaflex daily  . Myocardial infarction ([place] 12/27/10   "I've had several MIs"  . 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 PACEMAKER, PERMANENT 03/04/2008   pt denies this date  . Peripheral neuropathy    takes Gabapentin daily  . Presence of permanent cardiac pacemaker   . Shortness of breath dyspnea    "all my life" with exertion  . Sleep apnea    "if I lay flat I quit breathing; HOB up I'm fine"    ROS:   All systems reviewed and negative except as noted in the HPI.   Past Surgical History:  Procedure Laterality Date  . COLONOSCOPY    . CORONARY ANGIOPLASTY WITH STENT PLACEMENT  12/27/10   "I've had a total of 9 cardiac stents put in"  . CORONARY ARTERY BYPASS GRAFT  1992   CABG X 2  . ESOPHAGOGASTRODUODENOSCOPY N/A 12/01/2013   Procedure: ESOPHAGOGASTRODUODENOSCOPY (EGD);  Surgeon: DLafayette Dragon MD;  [facility];  Service: Endoscopy;  Laterality: N/A;  . INSERT / REPLACE / REMOVE PACEMAKER  ~ 2004   initial pacemaker placement  . INSERT / REPLACE / REMOVE PACEMAKER  10/2009   generator change  . LUMBAR LAMINECTOMY/DECOMPRESSION MICRODISCECTOMY Right 03/23/2014   Procedure: LAMINECTOMY AND FORAMINOTOMY RIGHT LUMBAR THREE-FOUR,LUMBAR FOUR-FIVE, LUMBAR FIVE-SACRAL ONE;  Surgeon: HCharlie Pitter MD;  [facility];  Service: Neurosurgery;  Laterality: Right;  right  . LUMBAR LAMINECTOMY/DECOMPRESSION MICRODISCECTOMY Left 12/01/2014   Procedure: Left Lumbar Three-Four, Lumbar Four-Five Laminectomy and Foraminotomy;  Surgeon: HEarnie Larsson MD;  [facility];  Service: Neurosurgery;  Laterality: Left;  . s/p left arm surgury after work accident  1991   "2000# steel fell on it"  . s/p right hand surgury for foreign object  1970's   "piece of wood went in my hand; had to get that out"     Family History  Problem Relation Age of Onset  . Diabetes Mother   . Diabetes Sister   . Heart disease Sister  2 sister died with heart disease  . Coronary artery disease Other 32       male, first degree relative  . Diabetes Other        1st degree relative  . Heart disease Sister   . Lung cancer Sister        deceased     Social History   Socioeconomic History  . Marital status: Married    Spouse name: Not on file  . Number of children: 2  . Years of education: Not on file  . Highest education level: Not on file  Occupational History  . Occupation: prior work Designer, industrial/product: UNEMPLOYED  . Occupation: disabled since 2004  Social Needs  . Financial resource strain: Not on file  . Food insecurity:    Worry: Not on file    Inability: Not on file  . Transportation needs:    Medical: Not on file    Non-medical: Not on file  Tobacco Use  . Smoking status: Former Smoker    Packs/day: 3.00    Years: 9.00    Pack years: 27.00    Types: Cigarettes    Last attempt to quit: 02/26/1973    Years since quitting: 45.0  . Smokeless tobacco: Former Systems developer  . Tobacco comment: quit smoking 13yr ago  Substance and Sexual Activity  . Alcohol use: No    Alcohol/week: 0.0 standard drinks  . Drug use: No    Comment: "used pouches of tobacco for a long time; quit those 12/09/1970"  . Sexual activity: Yes  Lifestyle  . Physical activity:    Days per week: Not on file     Minutes per session: Not on file  . Stress: Not on file  Relationships  . Social connections:    Talks on phone: Not on file    Gets together: Not on file    Attends religious service: Not on file    Active member of club or organization: Not on file    Attends meetings of clubs or organizations: Not on file    Relationship status: Not on file  . Intimate partner violence:    Fear of current or ex partner: Not on file    Emotionally abused: Not on file    Physically abused: Not on file    Forced sexual activity: Not on file  Other Topics Concern  . Not on file  Social History Narrative  . Not on file     BP 120/60   Pulse 84   Ht 5' 11" (1.803 m)   Wt 213 lb 3.2 oz (96.7 kg)   SpO2 (!) 89%   BMI 29.74 kg/m   Physical Exam:  Well appearing 73yo man, NAD HEENT: Unremarkable Neck:  No JVD, no thyromegally Lymphatics:  No adenopathy Back:  No CVA tenderness Lungs:  Clear with no wheezes HEART:  Regular rate rhythm, no murmurs, no rubs, no clicks Abd:  soft, positive bowel sounds, no organomegally, no rebound, no guarding Ext:  2 plus pulses, no edema, no cyanosis, no clubbing Skin:  No rashes no nodules Neuro:  CN II through XII intact, motor grossly intact   DEVICE  Normal device function.  See PaceArt for details.   Assess/Plan: 1. CHB - he is s/p PPM insertion. 2. PPM - his medtronic DDD PM is working normally.  3. Atrial fib - he is chronically in atrial fib and his rate is well controlled. 4. CAD - he is  s/p CABG and denies anginal symptoms.  5. Dyspnea - this is multifactorial. His oxygen sats are in the high 80's.   Mikle Bosworth.D.

## 2018-03-11 ENCOUNTER — Emergency Department (HOSPITAL_COMMUNITY): Payer: Medicare Other

## 2018-03-11 ENCOUNTER — Encounter (HOSPITAL_COMMUNITY): Payer: Self-pay

## 2018-03-11 ENCOUNTER — Emergency Department (HOSPITAL_COMMUNITY)
Admission: EM | Admit: 2018-03-11 | Discharge: 2018-03-11 | Disposition: A | Discharge status: Home / Self Care | Payer: Medicare Other | Attending: Emergency Medicine | Admitting: Emergency Medicine

## 2018-03-11 DIAGNOSIS — M25561 Pain in right knee: Secondary | ICD-10-CM | POA: Diagnosis not present

## 2018-03-11 DIAGNOSIS — Z7901 Long term (current) use of anticoagulants: Secondary | ICD-10-CM | POA: Diagnosis not present

## 2018-03-11 DIAGNOSIS — N182 Chronic kidney disease, stage 2 (mild): Secondary | ICD-10-CM | POA: Diagnosis not present

## 2018-03-11 DIAGNOSIS — Z7984 Long term (current) use of oral hypoglycemic drugs: Secondary | ICD-10-CM | POA: Diagnosis not present

## 2018-03-11 DIAGNOSIS — Z87891 Personal history of nicotine dependence: Secondary | ICD-10-CM | POA: Diagnosis not present

## 2018-03-11 DIAGNOSIS — Z79899 Other long term (current) drug therapy: Secondary | ICD-10-CM | POA: Diagnosis not present

## 2018-03-11 DIAGNOSIS — I130 Hypertensive heart and chronic kidney disease with heart failure and stage 1 through stage 4 chronic kidney disease, or unspecified chronic kidney disease: Secondary | ICD-10-CM | POA: Diagnosis not present

## 2018-03-11 DIAGNOSIS — I2510 Atherosclerotic heart disease of native coronary artery without angina pectoris: Secondary | ICD-10-CM | POA: Diagnosis not present

## 2018-03-11 DIAGNOSIS — E1122 Type 2 diabetes mellitus with diabetic chronic kidney disease: Secondary | ICD-10-CM | POA: Insufficient documentation

## 2018-03-11 DIAGNOSIS — S8991XA Unspecified injury of right lower leg, initial encounter: Secondary | ICD-10-CM | POA: Diagnosis not present

## 2018-03-11 DIAGNOSIS — I5032 Chronic diastolic (congestive) heart failure: Secondary | ICD-10-CM | POA: Diagnosis not present

## 2018-03-11 MED ORDER — HYDROCODONE-ACETAMINOPHEN 5-325 MG PO TABS
1.0000 | ORAL_TABLET | Freq: Once | ORAL | Status: AC
  Administered 2018-03-11: 1 via ORAL
  Filled 2018-03-11: qty 1

## 2018-03-11 NOTE — ED Notes (Signed)
Patient sitting on side of bed, O2 saturation 94% on room air.

## 2018-03-11 NOTE — ED Triage Notes (Signed)
Pt reports he fell Friday evening carrying groceries.  Reports significant pain in r knee since then.  Pt says he "saw stars" when he fell but remember the fall.

## 2018-03-11 NOTE — ED Provider Notes (Signed)
[facility] EMERGENCY DEPARTMENT Provider Note   CSN: [id number] Arrival date & time: 03/11/18  4287     History   Chief Complaint Chief Complaint  Patient presents with  . Knee Pain    HPI [name] is a 73 y.o. male.   Knee Pain    Pt was seen at 1135. Per pt, c/o gradual onset and persistence of constant right knee "pain" that began after he tripped and fell while walking up steps carrying groceries 3 days ago. Pt states he hit his knee when he fell. Pt was ambulatory immediately after and since the fall. Denies any other injuries. Denies hitting head, no LOC, no AMS, no neck or back pain, no CP/palpitations, no SOB/cougy, no abd pain, no N/V/D, no focal motor weakness, no tingling/numbness in extremities, no fevers, no rash, no open wounds.    [facility] Past Medical History:  Diagnosis Date  . Aortic root dilatation ([place]) 02/02/2011  . Arthritis    "lower back; going back down both my sciatic nerves"  . 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 BENIGN PROSTATIC HYPERTROPHY 08/29/2006   takes Flomax daily  . 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 Cataracts, bilateral    immature  . CHF (congestive heart failure) (HCC)    takes Lasix daily  . Chronic back pain    HNP   . CORONARY ARTERY DISEASE 08/29/2006   takes Coumadin daily  . DEPRESSION 08/29/2006  . DIABETES MELLITUS, TYPE II 08/29/2006   takes Metformin,Januvia,and Glipizide  daily  . DIASTOLIC HEART FAILURE, CHRONIC 06/09/2008  . GOUT 04/22/2007   takes Allopurinol daily  . History of migraine    29yr ago  . HYPERLIPIDEMIA 08/29/2006   takes Atorvastatin daily  . HYPERTENSION 08/29/2006   takes Lisinopril daily  . INSOMNIA-SLEEP DISORDER-UNSPEC 10/23/2007  . Left lumbar radiculopathy 05/30/2010  . LUMBAR RADICULOPATHY, RIGHT 06/10/2007  . Muscle spasm    takes Zanaflex daily  . Myocardial infarction ([place] 12/27/10   "I've had several MIs"  . 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  PACEMAKER, PERMANENT 03/04/2008   pt denies this date  . Peripheral neuropathy    takes Gabapentin daily  . Presence of permanent cardiac pacemaker   . Shortness of breath dyspnea    "all my life" with exertion  . Sleep apnea    "if I lay flat I quit breathing; HOB up I'm fine"    Patient Active Problem List   Diagnosis Date Noted  . Chondrocalcinosis 02/05/2018  . Acute respiratory failure with hypoxia ([place] 01/16/2018  . Elevated troponin 01/16/2018  . Weakness 12/21/2017  . Hypersomnolence 09/20/2017  . Acute renal failure superimposed on stage 2 chronic kidney disease ([place] 06/16/2017  . Left cervical radiculopathy 02/07/2017  . Trochanteric bursitis, right hip 01/01/2017  . Right hip pain 12/26/2016  . Knee instability, right 11/03/2016  . Urinary frequency 10/20/2016  . Right foot pain 09/29/2016  . Prepatellar bursitis 06/29/2016  . Left knee pain 06/28/2016  . Ingrown nail 06/28/2016  . Acute gouty arthritis 05/07/2016  . Left shoulder pain 02/09/2016  . Memory loss 01/19/2016  . Ingrown toenail 09/13/2015  . Renal insufficiency 09/08/2015  . Right leg swelling 07/13/2015  . Leg wound, right 06/15/2015  . Rash 06/06/2015  . Anemia 05/24/2015  . Cellulitis of leg, right 05/13/2015  . Greater trochanteric bursitis of left hip 08/12/2014  . Left leg pain 07/15/2014  . 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  Lumbar stenosis with neurogenic claudication 03/23/2014  . Long term current use of anticoagulant therapy 02/27/2014  . Dysphagia, pharyngoesophageal phase 12/01/2013  . 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 Dysphagia 11/11/2013  . Lumbago 11/03/2013  . Low back pain radiating to right leg 11/03/2013  . Abnormality of gait 11/03/2013  . Difficulty walking 11/03/2013  . Loss of weight 10/29/2013  . Atrial fibrillation ([place]) 08/26/2013  . Chest pain 04/05/2013  . Plantar fasciitis of right foot 02/18/2013  .  Bruit 08/21/2012  . Sciatica of left side 08/15/2012  . Left hip pain 08/15/2012  . Headache(784.0) 05/03/2012  . Acute bronchitis 12/14/2011  . Increased prostate specific antigen (PSA) velocity 11/11/2011  . Right sided sciatica 11/10/2011  . Chronic low back pain 05/03/2011  . Aortic root dilatation ([place]) 02/02/2011  . Wheezing without diagnosis of asthma 02/02/2011  . Syncope 12/27/2010  . Left lumbar radiculopathy 05/30/2010  . Wellness examination 05/30/2010  . 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 DOE (dyspnea on exertion) 06/10/2008  . DIASTOLIC HEART FAILURE, CHRONIC 06/09/2008  . KNEE PAIN, BILATERAL 04/21/2008  . LEG PAIN, BILATERAL 04/21/2008  . 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 PACEMAKER, PERMANENT 03/04/2008  . INSOMNIA-SLEEP DISORDER-UNSPEC 10/23/2007  . Lumbar radiculopathy, chronic 06/10/2007  . GOUT 04/22/2007  . 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 ERECTILE DYSFUNCTION 11/26/2006  . ALLERGIC RHINITIS 11/26/2006  . Type 2 diabetes, uncontrolled, with neuropathy ([place]) 08/29/2006  . Hyperlipidemia 08/29/2006  . Overweight(278.02) 08/29/2006  . DEPRESSION 08/29/2006  . Essential hypertension 08/29/2006  . CORONARY ARTERY DISEASE 08/29/2006  . BENIGN PROSTATIC HYPERTROPHY 08/29/2006    Past Surgical History:  Procedure Laterality Date  . COLONOSCOPY    . CORONARY ANGIOPLASTY WITH STENT PLACEMENT  12/27/10   "I've had a total of 9 cardiac stents put in"  . CORONARY ARTERY BYPASS GRAFT  1992   CABG X 2  . ESOPHAGOGASTRODUODENOSCOPY N/A 12/01/2013   Procedure: ESOPHAGOGASTRODUODENOSCOPY (EGD);  Surgeon: Lafayette Dragon, MD;  Location: [facility];  Service: Endoscopy;  Laterality: N/A;  . INSERT / REPLACE / REMOVE PACEMAKER  ~ 2004   initial pacemaker placement  . INSERT / REPLACE / REMOVE PACEMAKER  10/2009   generator change  . LUMBAR LAMINECTOMY/DECOMPRESSION MICRODISCECTOMY Right 03/23/2014   Procedure: LAMINECTOMY AND  FORAMINOTOMY RIGHT LUMBAR THREE-FOUR,LUMBAR FOUR-FIVE, LUMBAR FIVE-SACRAL ONE;  Surgeon: Charlie Pitter, MD;  Location: [facility];  Service: Neurosurgery;  Laterality: Right;  right  . LUMBAR LAMINECTOMY/DECOMPRESSION MICRODISCECTOMY Left 12/01/2014   Procedure: Left Lumbar Three-Four, Lumbar Four-Five Laminectomy and Foraminotomy;  Surgeon: Earnie Larsson, MD;  Location: [facility];  Service: Neurosurgery;  Laterality: Left;  . s/p left arm surgury after work accident  1991   "2000# steel fell on it"  . s/p right hand surgury for foreign object  1970's   "piece of wood went in my hand; had to get that out"        Home Medications    Prior to Admission medications   Medication Sig Start Date End Date Taking? Authorizing Provider  albuterol (PROAIR HFA) 108 (90 Base) MCG/ACT inhaler Inhale 2 puffs into the lungs every 6 (six) hours as needed for wheezing or shortness of breath. 02/05/18   Biagio Borg, MD  allopurinol (ZYLOPRIM) 300 MG tablet Take 1 tablet (300 mg total) by mouth daily. 01/31/18   Biagio Borg, MD  atorvastatin (LIPITOR) 80 MG tablet Take 1 tablet (80 mg total) by mouth daily. 01/31/18  Biagio Borg, MD  benzonatate (TESSALON PERLES) 100 MG capsule 1-2 tab by mouth every 6 hrs as needed for cough 01/31/18   Biagio Borg, MD  Blood Glucose Monitoring Suppl (ONE TOUCH ULTRA 2) w/Device KIT Use as directed 10/12/15   Biagio Borg, MD  carvedilol (COREG) 25 MG tablet Take 1 tablet (25 mg total) by mouth 2 (two) times daily. 01/31/18   Biagio Borg, MD  Cholecalciferol (VITAMIN D) 1000 UNITS capsule Take 1,000 Units by mouth 2 (two) times daily. Reported on 02/15/2015 07/31/13   [provider]  dextromethorphan-guaiFENesin (MUCINEX DM) 30-600 MG 12hr tablet Take 1 tablet by mouth 2 (two) times daily. 01/17/18   Kathie Dike, MD  donepezil (ARICEPT) 5 MG tablet Take 1 tablet (5 mg total) by mouth daily. 01/31/18   Biagio Borg, MD  fluticasone [facility]) 50 MCG/ACT nasal spray  USE 2 SPRAYS IN [facility]  NOSTRIL EVERY DAY 11/23/16   Biagio Borg, MD  furosemide (LASIX) 40 MG tablet Take 2 tablets (80 mg total) by mouth 2 (two) times daily. 01/31/18   Biagio Borg, MD  gabapentin (NEURONTIN) 300 MG capsule TAKE 2 CAPSULES BY MOUTH 3  TIMES DAILY 01/31/18   Biagio Borg, MD  glipiZIDE (GLUCOTROL XL) 10 MG 24 hr tablet Take 1 tablet (10 mg total) by mouth daily with breakfast. 01/31/18   Biagio Borg, MD  glucose blood (ONE TOUCH ULTRA TEST) test strip CHECK BLOOD SUGAR TWO TIMES DAILY 05/24/17   Biagio Borg, MD  HYDROcodone-acetaminophen (NORCO) 10-325 MG tablet Take 1 tablet by mouth daily as needed. 01/31/18   Biagio Borg, MD  lisinopril (PRINIVIL,ZESTRIL) 2.5 MG tablet Take 1 tablet (2.5 mg total) by mouth daily. 01/31/18   Biagio Borg, MD  metFORMIN (GLUCOPHAGE) 500 MG tablet 4 tab by mouth daily 01/31/18   Biagio Borg, MD  Multiple Vitamin (MULTIVITAMIN WITH MINERALS) TABS tablet Take 1 tablet by mouth every morning.    [provider]  [facility] DELICA LANCETS 35D MISC USE AS DIRECTED THREE TIMES DAILY TO CHECK BLOOD SUGAR 05/24/17   Biagio Borg, MD  oxybutynin (DITROPAN XL) 10 MG 24 hr tablet Take 1 tablet (10 mg total) by mouth at bedtime. 01/31/18   Biagio Borg, MD  potassium chloride SA (K-DUR,KLOR-CON) 20 MEQ tablet Take 1 tablet (20 mEq total) by mouth every evening. 01/31/18   Biagio Borg, MD  sitaGLIPtin (JANUVIA) 100 MG tablet Take 1 tablet (100 mg total) by mouth daily. 01/31/18   Biagio Borg, MD  tamsulosin [facility]) 0.4 MG CAPS capsule 1 tab by mouth twice per day 01/31/18   Biagio Borg, MD  tiZANidine (ZANAFLEX) 4 MG tablet TAKE 1 TABLET BY MOUTH   EVERY 6 HOURS AS NEEDED FOR MUSCLE SPASMS. 09/20/17   Biagio Borg, MD  warfarin (COUMADIN) 5 MG tablet Take 1/2 tablet daily except 1 tablet on Mondays, Wednesdays and Fridays Patient taking differently: Take 2.5-5 mg by mouth See admin instructions. Take 1/2 tablet daily except 1 tablet on Mondays, Wednesdays  and Fridays 04/26/17   [name], MD    Family History Family History  Problem Relation Age of Onset  . Diabetes Mother   . Diabetes Sister   . Heart disease Sister        2 sister died with heart disease  . Coronary artery disease Other 16       male, first degree relative  .  Diabetes Other        1st degree relative  . Heart disease Sister   . Lung cancer Sister        deceased    Social History Social History   Tobacco Use  . Smoking status: Former Smoker    Packs/day: 3.00    Years: 9.00    Pack years: 27.00    Types: Cigarettes    Last attempt to quit: 02/26/1973    Years since quitting: 45.0  . Smokeless tobacco: Former Systems developer  . Tobacco comment: quit smoking 40yr ago  Substance Use Topics  . Alcohol use: No    Alcohol/week: 0.0 standard drinks  . Drug use: No    Comment: "used pouches of tobacco for a long time; quit those 12/09/1970"     Allergies   Crestor [rosuvastatin calcium] and Latex   Review of Systems Review of Systems ROS: Statement: All systems negative except as marked or noted in the HPI; Constitutional: Negative for fever and chills. ; ; Eyes: Negative for eye pain, redness and discharge. ; ; ENMT: Negative for ear pain, hoarseness, nasal congestion, sinus pressure and sore throat. ; ; Cardiovascular: Negative for chest pain, palpitations, diaphoresis, dyspnea and peripheral edema. ; ; Respiratory: Negative for cough, wheezing and stridor. ; ; Gastrointestinal: Negative for nausea, vomiting, diarrhea, abdominal pain, blood in stool, hematemesis, jaundice and rectal bleeding. . ; ; Genitourinary: Negative for dysuria, flank pain and hematuria. ; ; Musculoskeletal: +right knee pain. Negative for back pain and neck pain. Negative for deformity.; ; Skin: Negative for pruritus, rash, abrasions, blisters, bruising and skin lesion.; ; Neuro: Negative for headache, lightheadedness and neck stiffness. Negative for weakness, altered level of consciousness,  altered mental status, extremity weakness, paresthesias, involuntary movement, seizure and syncope.       Physical Exam Updated Vital Signs BP 123/80 (BP Location: Right Arm)   Pulse 64   Temp 98.5 F (36.9 C) (Oral)   Resp 20   Ht '5\' 11"'  (1.803 m)   Wt 96.6 kg   SpO2 90%   BMI 29.71 kg/m   Physical Exam 1140: Physical examination:  Nursing notes reviewed; Vital signs and O2 SAT reviewed;  Constitutional: Well developed, Well nourished, Well hydrated, In no acute distress; Head:  Normocephalic, atraumatic. No abrasions or contusions to head/face.; Eyes: EOMI, PERRL, No scleral icterus; ENMT: Mouth and pharynx normal, Mucous membranes moist; Neck: Supple, Full range of motion, No lymphadenopathy; Cardiovascular: Regular rate and rhythm, No gallop; Respiratory: Breath sounds clear & equal bilaterally, No wheezes.  Speaking full sentences with ease, Normal respiratory effort/excursion; Chest: Nontender, Movement normal; Abdomen: Soft, Nontender, Nondistended, Normal bowel sounds; Genitourinary: No CVA tenderness; Spine:  No midline CS, TS, LS tenderness.;; Extremities: Peripheral pulses normal, Pelvis stable. NT left hip/ankle/foot. +FROM right knee, including able to lift extended RLE off stretcher, and extend right lower leg against resistance.  No ligamentous laxity.  No patellar or quad tendon step-offs.  NMS intact right foot, strong pedal pp. +plantarflexion of right foot w/calf squeeze.  No palpable gap right Achilles's tendon.  No proximal fibular head tenderness.  No open wounds, edema, erythema, warmth, ecchymosis or deformity.  +patellar area with generalized TTP, otherwise no specific area of point tenderness.  Otherwise full range of motion major/large joints of bilat UE's and LE's without pain or tenderness to palp. +2 pedal edema bilat. No calf tenderness asymmetry.; Neuro: AA&Ox3, Major CN grossly intact.  Speech clear. No gross focal motor deficits in extremities.; Skin:  Color  normal, Warm, Dry.    ED Treatments / Results  Labs (all labs ordered are listed, but only abnormal results are displayed)   EKG None  Radiology   Procedures Procedures (including critical care time)  Medications Ordered in ED Medications  HYDROcodone-acetaminophen (NORCO/VICODIN) 5-325 MG per tablet 1 tablet (has no administration in time range)     Initial Impression / Assessment and Plan / ED Course  I have reviewed the triage vital signs and the nursing notes.  Pertinent labs & imaging results that were available during my care of the patient were reviewed by me and considered in my medical decision making (see chart for details).  MDM Reviewed: previous chart, nursing note and vitals Interpretation: x-ray    Dg Knee Complete 4 Views Right Result Date: 03/11/2018 CLINICAL DATA:  73 year old with lower extremity pain and fall 3 days ago. EXAM: RIGHT KNEE - COMPLETE 4+ VIEW COMPARISON:  None. FINDINGS: Negative for fracture or dislocation involving the right knee. Mild spurring along the lateral knee compartment. Spurring at the patellofemoral compartment. Question a small joint effusion. Normal alignment. 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 No acute bone abnormality. Electronically Signed   By: Markus Daft M.D.   On: 03/11/2018 10:07    1200:  XR reassuring. No open wounds. Last INR check was 3 weeks ago (3.0). Pt questioned several times: Pt denies hitting head, no LOC, no AMS, and denies neck/back pain. Head in atraumatic on exam.  Pt also denies new/worsening SOB or cough, Sats 94% R/A on my re-exam. States he "feels fine" other than his right knee pain. Tx symptomatically at this time, f/u Ortho MD. Pt will walk with his walker until seen in f/u. Dx and testing d/w pt and family.  Questions answered.  Verb understanding, agreeable to d/c home with outpt f/u.       Final Clinical Impressions(s) / ED  Diagnoses   Final diagnoses:  None    ED Discharge Orders    None       Francine Graven, DO 03/13/18 1859

## 2018-03-11 NOTE — Discharge Instructions (Addendum)
Take your usual prescriptions as previously directed.  Take over the counter tylenol, as directed on packaging, as needed for discomfort. Apply moist heat or ice to the area(s) of discomfort, for 15 minutes at a time, several times per day for the next few days.  Do not fall asleep on a heating or ice pack.  Wear the knee support and walk with your walker until you are seen in follow up. Call your regular medical doctor today to schedule a follow up appointment in the next 2 days. Call your Orthopedic doctor today to schedule a follow up appointment within the next week.  Return to the Emergency Department immediately if worsening.

## 2018-03-13 ENCOUNTER — Encounter: Payer: Self-pay | Admitting: Internal Medicine

## 2018-03-13 ENCOUNTER — Ambulatory Visit: Payer: Self-pay

## 2018-03-13 ENCOUNTER — Ambulatory Visit (INDEPENDENT_AMBULATORY_CARE_PROVIDER_SITE_OTHER): Payer: Medicare Other | Admitting: Internal Medicine

## 2018-03-13 ENCOUNTER — Ambulatory Visit (INDEPENDENT_AMBULATORY_CARE_PROVIDER_SITE_OTHER): Payer: Medicare Other | Admitting: Family Medicine

## 2018-03-13 VITALS — BP 118/68 | HR 84 | Temp 98.1°F | Ht 71.0 in | Wt 213.0 lb

## 2018-03-13 DIAGNOSIS — M25561 Pain in right knee: Secondary | ICD-10-CM | POA: Diagnosis not present

## 2018-03-13 DIAGNOSIS — M1711 Unilateral primary osteoarthritis, right knee: Secondary | ICD-10-CM | POA: Diagnosis not present

## 2018-03-13 DIAGNOSIS — S82024A Nondisplaced longitudinal fracture of right patella, initial encounter for closed fracture: Secondary | ICD-10-CM | POA: Diagnosis not present

## 2018-03-13 DIAGNOSIS — E1140 Type 2 diabetes mellitus with diabetic neuropathy, unspecified: Secondary | ICD-10-CM | POA: Diagnosis not present

## 2018-03-13 DIAGNOSIS — IMO0002 Reserved for concepts with insufficient information to code with codable children: Secondary | ICD-10-CM

## 2018-03-13 DIAGNOSIS — M1712 Unilateral primary osteoarthritis, left knee: Secondary | ICD-10-CM | POA: Insufficient documentation

## 2018-03-13 DIAGNOSIS — I10 Essential (primary) hypertension: Secondary | ICD-10-CM

## 2018-03-13 DIAGNOSIS — E1165 Type 2 diabetes mellitus with hyperglycemia: Secondary | ICD-10-CM | POA: Diagnosis not present

## 2018-03-13 MED ORDER — HYDROCODONE-ACETAMINOPHEN 10-325 MG PO TABS: 1.0000 | ORAL_TABLET | Freq: Every day | ORAL | 0 refills | Status: DC | PRN

## 2018-03-13 NOTE — Assessment & Plan Note (Signed)
stable overall by history and exam, recent data reviewed with pt, and pt to continue medical treatment as before,  to f/u any worsening symptoms or concerns  

## 2018-03-13 NOTE — Progress Notes (Signed)
Subjective:    Patient ID: [name], male    DOB: 1945/10/26, 73 y.o.   MRN: [id number]  HPI  Here after unfortunate fall x 2 days ago up the 3 stairs from driveway to back door, bringing in the groceries, and left knee juse "went out" like it has in the past, has been seen per ortho and told he could just "watch it" and denies pain or swelling, and not overly concerned about this.  He mainly c/o right knee pain, swelling that just does not seem to be getting better, even now 2 days after hitting the right knee on the concrete, and recent plain films feb 10 neg for fracture.  Has severe stiffness, and is hoping to see Sport Med this am due to high risk of fall, though is using his cane. Pt denies chest pain, increased sob or doe, wheezing, orthopnea, PND, increased LE swelling, palpitations, dizziness or syncope.  Pt denies new neurological symptoms such as new headache, or facial or extremity weakness or numbness.   Pt denies polydipsia, polyuria Past Medical History:  Diagnosis Date  . Aortic root dilatation ([place]) 02/02/2011  . Arthritis    "lower back; going back down both my sciatic nerves"  . 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 BENIGN PROSTATIC HYPERTROPHY 08/29/2006   takes Flomax daily  . 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 Cataracts, bilateral    immature  . CHF (congestive heart failure) (HCC)    takes Lasix daily  . Chronic back pain    HNP   . CORONARY ARTERY DISEASE 08/29/2006   takes Coumadin daily  . DEPRESSION 08/29/2006  . DIABETES MELLITUS, TYPE II 08/29/2006   takes Metformin,Januvia,and Glipizide  daily  . DIASTOLIC HEART FAILURE, CHRONIC 06/09/2008  . GOUT 04/22/2007   takes Allopurinol daily  . History of migraine    11yr ago  . HYPERLIPIDEMIA 08/29/2006   takes Atorvastatin daily  . HYPERTENSION 08/29/2006   takes Lisinopril daily  . INSOMNIA-SLEEP DISORDER-UNSPEC 10/23/2007  . Left lumbar radiculopathy 05/30/2010  . LUMBAR RADICULOPATHY,  RIGHT 06/10/2007  . Muscle spasm    takes Zanaflex daily  . [place] ([place] 12/27/[place]   "I've had several MIs"  . 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 PACEMAKER, PERMANENT 03/04/2008   pt denies this date  . Peripheral neuropathy    takes Gabapentin daily  . Presence of permanent cardiac pacemaker   . Shortness of breath dyspnea    "all my life" with exertion  . Sleep apnea    "if I lay flat I quit breathing; HOB up I'm fine"   Past Surgical History:  Procedure Laterality Date  . COLONOSCOPY    . CORONARY ANGIOPLASTY WITH STENT PLACEMENT  12/27/10   "I've had a total of 9 cardiac stents put in"  . CORONARY ARTERY BYPASS GRAFT  1992   CABG X 2  . ESOPHAGOGASTRODUODENOSCOPY N/A 12/01/2013   Procedure: ESOPHAGOGASTRODUODENOSCOPY (EGD);  Surgeon: DLafayette Dragon MD;  [facility];  Service: Endoscopy;  Laterality: N/A;  . INSERT / REPLACE / REMOVE PACEMAKER  ~ 2004   initial pacemaker placement  . INSERT / REPLACE / REMOVE PACEMAKER  10/2009   generator change  . LUMBAR LAMINECTOMY/DECOMPRESSION MICRODISCECTOMY Right 03/23/2014   Procedure: LAMINECTOMY AND FORAMINOTOMY RIGHT LUMBAR THREE-FOUR,LUMBAR FOUR-FIVE, LUMBAR FIVE-SACRAL ONE;  Surgeon: HCharlie Pitter MD;  [facility];  Service: Neurosurgery;  Laterality: Right;  right  . LUMBAR LAMINECTOMY/DECOMPRESSION MICRODISCECTOMY Left 12/01/2014  Procedure: Left Lumbar Three-Four, Lumbar Four-Five Laminectomy and Foraminotomy;  Surgeon: Earnie Larsson, MD;  Location: [facility];  Service: Neurosurgery;  Laterality: Left;  . s/p left arm surgury after work accident  1991   "2000# steel fell on it"  . s/p right hand surgury for foreign object  1970's   "piece of wood went in my hand; had to get that out"    reports that he quit smoking about 45 years ago. His smoking use included cigarettes. He has a 27.00 pack-year smoking history. He has quit using smokeless tobacco. He reports that he does not drink  alcohol or use drugs. family history includes Coronary artery disease (age of onset: 24) in an other family member; Diabetes in his mother, sister, and another family member; Heart disease in his sister and sister; Lung cancer in his sister. Allergies  Allergen Reactions  . Crestor [Rosuvastatin Calcium] Other (See Comments)    Urination of blood  . Latex Rash and Other (See Comments)    Gloves cause welts on hands!!   Current Outpatient Medications on File Prior to Visit  Medication Sig Dispense Refill  . albuterol (PROAIR HFA) 108 (90 Base) MCG/ACT inhaler Inhale 2 puffs into the lungs every 6 (six) hours as needed for wheezing or shortness of breath. 3 Inhaler 3  . allopurinol (ZYLOPRIM) 300 MG tablet Take 1 tablet (300 mg total) by mouth daily. 90 tablet 3  . atorvastatin (LIPITOR) 80 MG tablet Take 1 tablet (80 mg total) by mouth daily. 90 tablet 3  . benzonatate (TESSALON PERLES) 100 MG capsule 1-2 tab by mouth every 6 hrs as needed for cough 60 capsule 1  . Blood Glucose Monitoring Suppl (ONE TOUCH ULTRA 2) w/Device KIT Use as directed 1 each 3  . carvedilol (COREG) 25 MG tablet Take 1 tablet (25 mg total) by mouth 2 (two) times daily. 180 tablet 3  . Cholecalciferol (VITAMIN D) 1000 UNITS capsule Take 1,000 Units by mouth 2 (two) times daily. Reported on 02/15/2015    . dextromethorphan-guaiFENesin (MUCINEX DM) 30-600 MG 12hr tablet Take 1 tablet by mouth 2 (two) times daily. 30 tablet 0  . donepezil (ARICEPT) 5 MG tablet Take 1 tablet (5 mg total) by mouth daily. 90 tablet 3  . fluticasone (FLONASE) 50 MCG/ACT nasal spray USE 2 SPRAYS IN EACH  NOSTRIL EVERY DAY 48 g 0  . furosemide (LASIX) 40 MG tablet Take 2 tablets (80 mg total) by mouth 2 (two) times daily. 720 tablet 3  . gabapentin (NEURONTIN) 300 MG capsule TAKE 2 CAPSULES BY MOUTH 3  TIMES DAILY 540 capsule 3  . glipiZIDE (GLUCOTROL XL) 10 MG 24 hr tablet Take 1 tablet (10 mg total) by mouth daily with breakfast. 90 tablet 3  .  glucose blood (ONE TOUCH ULTRA TEST) test strip CHECK BLOOD SUGAR TWO TIMES DAILY 100 each 3  . lisinopril (PRINIVIL,ZESTRIL) 2.5 MG tablet Take 1 tablet (2.5 mg total) by mouth daily. 90 tablet 3  . metFORMIN (GLUCOPHAGE) 500 MG tablet 4 tab by mouth daily 360 tablet 3  . Multiple Vitamin (MULTIVITAMIN WITH MINERALS) TABS tablet Take 1 tablet by mouth every morning.    Glory Rosebush DELICA LANCETS 92K MISC USE AS DIRECTED THREE TIMES DAILY TO CHECK BLOOD SUGAR 300 each 0  . oxybutynin (DITROPAN XL) 10 MG 24 hr tablet Take 1 tablet (10 mg total) by mouth at bedtime. 90 tablet 3  . potassium chloride SA (K-DUR,KLOR-CON) 20 MEQ tablet Take 1 tablet (  20 mEq total) by mouth every evening. 90 tablet 3  . sitaGLIPtin (JANUVIA) 100 MG tablet Take 1 tablet (100 mg total) by mouth daily. 90 tablet 3  . tamsulosin (FLOMAX) 0.4 MG CAPS capsule 1 tab by mouth twice per day 180 capsule 3  . tiZANidine (ZANAFLEX) 4 MG tablet TAKE 1 TABLET BY MOUTH   EVERY 6 HOURS AS NEEDED FOR MUSCLE SPASMS. 180 tablet 2  . warfarin (COUMADIN) 5 MG tablet Take 1/2 tablet daily except 1 tablet on Mondays, Wednesdays and Fridays (Patient taking differently: Take 2.5-5 mg by mouth See admin instructions. Take 1/2 tablet daily except 1 tablet on Mondays, Wednesdays and Fridays) 90 tablet 3   No current facility-administered medications on file prior to visit.    Review of Systems  Constitutional: Negative for other unusual diaphoresis or sweats HENT: Negative for ear discharge or swelling Eyes: Negative for other worsening visual disturbances Respiratory: Negative for stridor or other swelling  Gastrointestinal: Negative for worsening distension or other blood Genitourinary: Negative for retention or other urinary change Musculoskeletal: Negative for other MSK pain or swelling Skin: Negative for color change or other new lesions Neurological: Negative for worsening tremors and other numbness  Psychiatric/Behavioral: Negative for  worsening agitation or other fatigue All other system neg per pt    Objective:   Physical Exam BP 118/68   Pulse 84   Temp 98.1 F (36.7 C) (Oral)   Ht '5\' 11"'  (1.803 m)   Wt 213 lb (96.6 kg)   SpO2 98%   BMI 29.71 kg/m  VS noted,  Constitutional: Pt appears in NAD HENT: Head: NCAT.  Right Ear: External ear normal.  Left Ear: External ear normal.  Eyes: . Pupils are equal, round, and reactive to light. Conjunctivae and EOM are normal Nose: without d/c or deformity Neck: Neck supple. Gross normal ROM Cardiovascular: Normal rate and regular rhythm.   Pulmonary/Chest: Effort normal and breath sounds without rales or wheezing.  Abd:  Soft, NT, ND, + BS, no organomegaly Right knee with sleeve, but still markedly warm, swelling, tender anteriorly Neurological: Pt is alert. At baseline orientation, motor grossly intact Skin: Skin is warm. No rashes, other new lesions, no LE edema Psychiatric: Pt behavior is normal without agitation  No other exam findings Lab Results  Component Value Date   WBC 8.2 01/31/2018   HGB 9.9 (L) 01/31/2018   HCT 31.1 (L) 01/31/2018   PLT 195.0 01/31/2018   GLUCOSE 87 01/31/2018   CHOL 81 01/31/2018   TRIG 54.0 01/31/2018   HDL 32.60 (L) 01/31/2018   LDLCALC 38 01/31/2018   ALT 25 01/31/2018   AST 30 01/31/2018   NA 137 01/31/2018   K 4.3 01/31/2018   CL 97 01/31/2018   CREATININE 1.29 01/31/2018   BUN 23 01/31/2018   CO2 33 (H) 01/31/2018   TSH 1.85 09/06/2017   PSA 5.07 (H) 09/06/2017   INR 3.0 02/18/2018   HGBA1C 7.3 (H) 01/31/2018   MICROALBUR 3.1 (H) 09/24/2017       Assessment & Plan:

## 2018-03-13 NOTE — Assessment & Plan Note (Signed)
By hx, most c/w post traumatic arthritis but cant r/o other, for pain control, also refer sport med today if possible, hold further imaging for now or ortho eval

## 2018-03-13 NOTE — Assessment & Plan Note (Signed)
Injection given today.  Discussed icing regimen and home exercise.  Discussed which activities to do which wants to avoid.  Increase activity slowly.  Patient has chronic pain meds from his other provider.  Did have a small linear vertical fracture of the patella but should heal fine.  We discussed bracing which patient will consider but do not want to take it today

## 2018-03-13 NOTE — Patient Instructions (Signed)
Good to see you  Try to wear the brace daily for at least 2 weeks Injected the knee  Knee cap small fracture but will heal  Get arnica lotion over the counter put on daily  See me again in 4 weeks

## 2018-03-13 NOTE — Progress Notes (Signed)
[name] [address] [place], [place] [address] Phone: [phone] Subjective:    I [name] am serving as a Education administrator for Dr. Hulan Saas.   CC: Right knee pain  BHA:[id number]  [name] is a 73 y.o. male coming in with complaint of right knee pain. [place] this past weekend. Hands were full going up some steps. Left knee gave out on him. Fell directly on his knee. Swollen. States that Sunday he couldn't walk. Using a compression sleeve.   Onset- Saturday Location Duration-  Character-aching sensation. Aggravating factors- Walking, stairs Reliving factors- heat  Therapies tried-  Severity-9 out of 10   Patient did present to the emergency room.  X-rays were independently visualized by me showing mild osteoarthritic changes but no significant acute fracture  Past Medical History:  Diagnosis Date  . Aortic root dilatation ([place]) 02/02/2011  . Arthritis    "lower back; going back down both my sciatic nerves"  . 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 BENIGN PROSTATIC HYPERTROPHY 08/29/2006   takes Flomax daily  . 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 Cataracts, bilateral    immature  . CHF (congestive heart failure) (HCC)    takes Lasix daily  . Chronic back pain    HNP   . CORONARY ARTERY DISEASE 08/29/2006   takes Coumadin daily  . DEPRESSION 08/29/2006  . DIABETES MELLITUS, TYPE II 08/29/2006   takes Metformin,Januvia,and Glipizide  daily  . DIASTOLIC HEART FAILURE, CHRONIC 06/09/2008  . GOUT 04/22/2007   takes Allopurinol daily  . History of migraine    24yr ago  . HYPERLIPIDEMIA 08/29/2006   takes Atorvastatin daily  . HYPERTENSION 08/29/2006   takes Lisinopril daily  . INSOMNIA-SLEEP DISORDER-UNSPEC 10/23/2007  . Left lumbar radiculopathy 05/30/2010  . LUMBAR RADICULOPATHY, RIGHT 06/10/2007  . Muscle spasm    takes Zanaflex daily  . [place]ocardial infarction ([place] 12/27/10   "I've had several MIs"  . NEOPLASM,  MALIGNANT, PROSTATE 11/26/2006  . PACEMAKER, PERMANENT 03/04/2008   pt denies this date  . Peripheral neuropathy    takes Gabapentin daily  . Presence of permanent cardiac pacemaker   . Shortness of breath dyspnea    "all my life" with exertion  . Sleep apnea    "if I lay flat I quit breathing; HOB up I'm fine"   Past Surgical History:  Procedure Laterality Date  . COLONOSCOPY    . CORONARY ANGIOPLASTY WITH STENT PLACEMENT  12/27/10   "I've had a total of 9 cardiac stents put in"  . CORONARY ARTERY BYPASS GRAFT  1992   CABG X 2  . ESOPHAGOGASTRODUODENOSCOPY N/A 12/01/2013   Procedure: ESOPHAGOGASTRODUODENOSCOPY (EGD);  Surgeon: DLafayette Dragon MD;  [facility];  Service: Endoscopy;  Laterality: N/A;  . INSERT / REPLACE / REMOVE PACEMAKER  ~ 2004   initial pacemaker placement  . INSERT / REPLACE / REMOVE PACEMAKER  10/2009   generator change  . LUMBAR LAMINECTOMY/DECOMPRESSION MICRODISCECTOMY Right 03/23/2014   Procedure: LAMINECTOMY AND FORAMINOTOMY RIGHT LUMBAR THREE-FOUR,LUMBAR FOUR-FIVE, LUMBAR FIVE-SACRAL ONE;  Surgeon: HCharlie Pitter MD;  [facility];  Service: Neurosurgery;  Laterality: Right;  right  . LUMBAR LAMINECTOMY/DECOMPRESSION MICRODISCECTOMY Left 12/01/2014   Procedure: Left Lumbar Three-Four, Lumbar Four-Five Laminectomy and Foraminotomy;  Surgeon: HEarnie Larsson MD;  [facility];  Service: Neurosurgery;  Laterality: Left;  . s/p left arm surgury after work accident  1991   "2000# steel fell on it"  .  s/p right hand surgury for foreign object  1970's   "piece of wood went in my hand; had to get that out"   Social History   Socioeconomic History  . Marital status: Married    Spouse name: Not on file  . Number of children: 2  . Years of education: Not on file  . Highest education level: Not on file  Occupational History  . Occupation: prior work Designer, industrial/product: UNEMPLOYED  . Occupation: disabled since 2004  Social Needs    . Financial resource strain: Not on file  . Food insecurity:    Worry: Not on file    Inability: Not on file  . Transportation needs:    Medical: Not on file    Non-medical: Not on file  Tobacco Use  . Smoking status: Former Smoker    Packs/day: 3.00    Years: 9.00    Pack years: 27.00    Types: Cigarettes    Last attempt to quit: 02/26/1973    Years since quitting: 45.0  . Smokeless tobacco: Former Systems developer  . Tobacco comment: quit smoking 41yr ago  Substance and Sexual Activity  . Alcohol use: No    Alcohol/week: 0.0 standard drinks  . Drug use: No    Comment: "used pouches of tobacco for a long time; quit those 12/09/1970"  . Sexual activity: Yes  Lifestyle  . Physical activity:    Days per week: Not on file    Minutes per session: Not on file  . Stress: Not on file  Relationships  . Social connections:    Talks on phone: Not on file    Gets together: Not on file    Attends religious service: Not on file    Active member of club or organization: Not on file    Attends meetings of clubs or organizations: Not on file    Relationship status: Not on file  Other Topics Concern  . Not on file  Social History Narrative  . Not on file   Allergies  Allergen Reactions  . Crestor [Rosuvastatin Calcium] Other (See Comments)    Urination of blood  . Latex Rash and Other (See Comments)    Gloves cause welts on hands!!   Family History  Problem Relation Age of Onset  . Diabetes Mother   . Diabetes Sister   . Heart disease Sister        2 sister died with heart disease  . Coronary artery disease Other 58      male, first degree relative  . Diabetes Other        1st degree relative  . Heart disease Sister   . Lung cancer Sister        deceased    Current Outpatient Medications (Endocrine & Metabolic):  .  glipiZIDE (GLUCOTROL XL) 10 MG 24 hr tablet, Take 1 tablet (10 mg total) by mouth daily with breakfast. .  metFORMIN (GLUCOPHAGE) 500 MG tablet, 4 tab by mouth  daily .  sitaGLIPtin (JANUVIA) 100 MG tablet, Take 1 tablet (100 mg total) by mouth daily.  Current Outpatient Medications (Cardiovascular):  .  atorvastatin (LIPITOR) 80 MG tablet, Take 1 tablet (80 mg total) by mouth daily. .  carvedilol (COREG) 25 MG tablet, Take 1 tablet (25 mg total) by mouth 2 (two) times daily. .  furosemide (LASIX) 40 MG tablet, Take 2 tablets (80 mg total) by mouth 2 (two) times daily. .Marland Kitchen lisinopril (PRINIVIL,ZESTRIL) 2.5 MG tablet,  Take 1 tablet (2.5 mg total) by mouth daily.  Current Outpatient Medications (Respiratory):  .  albuterol (PROAIR HFA) 108 (90 Base) MCG/ACT inhaler, Inhale 2 puffs into the lungs every 6 (six) hours as needed for wheezing or shortness of breath. .  benzonatate (TESSALON PERLES) 100 MG capsule, 1-2 tab by mouth every 6 hrs as needed for cough .  dextromethorphan-guaiFENesin (MUCINEX DM) 30-600 MG 12hr tablet, Take 1 tablet by mouth 2 (two) times daily. .  fluticasone (FLONASE) 50 MCG/ACT nasal spray, USE 2 SPRAYS IN EACH  NOSTRIL EVERY DAY  Current Outpatient Medications (Analgesics):  .  allopurinol (ZYLOPRIM) 300 MG tablet, Take 1 tablet (300 mg total) by mouth daily. Marland Kitchen  HYDROcodone-acetaminophen (NORCO) 10-325 MG tablet, Take 1 tablet by mouth daily as needed.  Current Outpatient Medications (Hematological):  .  warfarin (COUMADIN) 5 MG tablet, Take 1/2 tablet daily except 1 tablet on Mondays, Wednesdays and Fridays (Patient taking differently: Take 2.5-5 mg by mouth See admin instructions. Take 1/2 tablet daily except 1 tablet on Mondays, Wednesdays and Fridays)  Current Outpatient Medications (Other):  .  Blood Glucose Monitoring Suppl (ONE TOUCH ULTRA 2) w/Device KIT, Use as directed .  Cholecalciferol (VITAMIN D) 1000 UNITS capsule, Take 1,000 Units by mouth 2 (two) times daily. Reported on 02/15/2015 .  donepezil (ARICEPT) 5 MG tablet, Take 1 tablet (5 mg total) by mouth daily. Marland Kitchen  gabapentin (NEURONTIN) 300 MG capsule, TAKE 2  CAPSULES BY MOUTH 3  TIMES DAILY .  glucose blood (ONE TOUCH ULTRA TEST) test strip, CHECK BLOOD SUGAR TWO TIMES DAILY .  Multiple Vitamin (MULTIVITAMIN WITH MINERALS) TABS tablet, Take 1 tablet by mouth every morning. Glory Rosebush DELICA LANCETS 48N MISC, USE AS DIRECTED THREE TIMES DAILY TO CHECK BLOOD SUGAR .  oxybutynin (DITROPAN XL) 10 MG 24 hr tablet, Take 1 tablet (10 mg total) by mouth at bedtime. .  potassium chloride SA (K-DUR,KLOR-CON) 20 MEQ tablet, Take 1 tablet (20 mEq total) by mouth every evening. .  tamsulosin (FLOMAX) 0.4 MG CAPS capsule, 1 tab by mouth twice per day .  tiZANidine (ZANAFLEX) 4 MG tablet, TAKE 1 TABLET BY MOUTH   EVERY 6 HOURS AS NEEDED FOR MUSCLE SPASMS.    Past medical history, social, surgical and family history all reviewed in electronic medical record.  No pertanent information unless stated regarding to the chief complaint.   Review of Systems:  No headache, visual changes, nausea, vomiting, diarrhea, constipation, dizziness, abdominal pain, skin rash, fevers, chills, night sweats, weight loss, swollen lymph nodes,  chest pain, shortness of breath, mood changes.  Muscle aches and joint swelling  Objective    General: No apparent distress alert and oriented x3 mood and affect normal, dressed appropriately.  HEENT: Pupils equal, extraocular movements intact  Respiratory: Patient's speak in full sentences and does not appear short of breath  Cardiovascular: 1+ lower extremity edema, non tender, no erythema  Skin: Warm dry intact with no signs of infection or rash on extremities or on axial skeleton.  Abdomen: Soft nontender  Neuro: Cranial nerves II through XII are intact, neurovascularly intact in all extremities with 2+ DTRs and 2+ pulses.  Lymph: No lymphadenopathy of posterior or anterior cervical chain or axillae bilaterally.  Gait antalgic gait walks with the aid of a cane MSK: Severe arthritic changes of multiple joints  Knee exam shows a trace  effusion noted.  Does have near full range of motion.  Tender to palpation over the patella itself as  well as the medial joint line.  Patient does have involuntary guarding with palpation over the patella.  Neurovascularly intact distally.   Musculoskeletal ultrasound of the right knee was performed to interpreted by Lyndal Pulley  Limited ultrasound of patient's right knee shows a linear fracture of the patella noted but no displacement.  This is more in a vertical pattern.  Does not go along the length of the patella.  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 Right knee was prepped with alcohol swab and utilizing anterolateral approach, patient's right knee space was injected with 4:1  marcaine 0.5%: Kenalog 8m/dL. Patient tolerated the procedure well without immediate complications.   Impression and Recommendations:     This case required medical decision making of moderate complexity. The above documentation has been reviewed and is accurate and complete ZLyndal Pulley DO       Note: This dictation was prepared with Dragon dictation along with smaller phrase technology. Any transcriptional errors that result from this process are unintentional.

## 2018-03-13 NOTE — Patient Instructions (Signed)
Please take all new medication as prescribed - the pain medication  Please continue all other medications as before, and refills have been done if requested.  Please have the pharmacy call with any other refills you may need.  Please continue your efforts at being more active, low cholesterol diet, and weight control.  Please keep your appointments with your specialists as you may have planned

## 2018-04-01 ENCOUNTER — Ambulatory Visit (INDEPENDENT_AMBULATORY_CARE_PROVIDER_SITE_OTHER): Payer: Medicare Other | Admitting: *Deleted

## 2018-04-01 DIAGNOSIS — I4891 Unspecified atrial fibrillation: Secondary | ICD-10-CM

## 2018-04-01 DIAGNOSIS — Z7901 Long term (current) use of anticoagulants: Secondary | ICD-10-CM

## 2018-04-01 DIAGNOSIS — I483 Typical atrial flutter: Secondary | ICD-10-CM

## 2018-04-01 LAB — POCT INR: INR: 3.5 — AB (ref 2.0–3.0)

## 2018-04-01 NOTE — Patient Instructions (Signed)
Description   Skip today's dose, then Continue taking 1 tablet each Monday, Wednesday and Friday, 1/2 tablet all other days.  Repeat INR in 3 weeks.

## 2018-04-10 ENCOUNTER — Ambulatory Visit (INDEPENDENT_AMBULATORY_CARE_PROVIDER_SITE_OTHER): Payer: Medicare Other | Admitting: Family Medicine

## 2018-04-10 ENCOUNTER — Encounter: Payer: Self-pay | Admitting: Family Medicine

## 2018-04-10 ENCOUNTER — Other Ambulatory Visit: Payer: Self-pay

## 2018-04-10 DIAGNOSIS — M1711 Unilateral primary osteoarthritis, right knee: Secondary | ICD-10-CM

## 2018-04-10 NOTE — Progress Notes (Signed)
[name] [place], [place] [address] Phone: [phone] Subjective:    I'm seeing this patient by the request  of:    CC: Right knee pain follow-up  IDP:[id number]    Update 04/10/2018: [name] is a 73 y.o. male coming in with complaint of right knee pain. Feels improvement since last visit post injection. Also feels that the brace is helping to alleviate his pain.  Patient states that he is feeling 80 to 90% better.  Feeling more confident on the knee as well.      Past Medical History:  Diagnosis Date  . Aortic root dilatation ([place]) 02/02/2011  . Arthritis    "lower back; going back down both my sciatic nerves"  . 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 BENIGN PROSTATIC HYPERTROPHY 08/29/2006   takes Flomax daily  . 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 Cataracts, bilateral    immature  . CHF (congestive heart failure) (HCC)    takes Lasix daily  . Chronic back pain    HNP   . CORONARY ARTERY DISEASE 08/29/2006   takes Coumadin daily  . DEPRESSION 08/29/2006  . DIABETES MELLITUS, TYPE II 08/29/2006   takes Metformin,Januvia,and Glipizide  daily  . DIASTOLIC HEART FAILURE, CHRONIC 06/09/2008  . GOUT 04/22/2007   takes Allopurinol daily  . History of migraine    3yr ago  . HYPERLIPIDEMIA 08/29/2006   takes Atorvastatin daily  . HYPERTENSION 08/29/2006   takes Lisinopril daily  . INSOMNIA-SLEEP DISORDER-UNSPEC 10/23/2007  . Left lumbar radiculopathy 05/30/2010  . LUMBAR RADICULOPATHY, RIGHT 06/10/2007  . Muscle spasm    takes Zanaflex daily  . [place] ([place] 12/27/10   "I've had several MIs"  . 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 PACEMAKER, PERMANENT 03/04/2008   pt denies this date  . Peripheral neuropathy    takes Gabapentin daily  . Presence of permanent cardiac pacemaker   . Shortness of breath dyspnea    "all my life" with exertion  . Sleep apnea    "if I lay flat I quit  breathing; HOB up I'm fine"   Past Surgical History:  Procedure Laterality Date  . COLONOSCOPY    . CORONARY ANGIOPLASTY WITH STENT PLACEMENT  12/27/10   "I've had a total of 9 cardiac stents put in"  . CORONARY ARTERY BYPASS GRAFT  1992   CABG X 2  . ESOPHAGOGASTRODUODENOSCOPY N/A 12/01/2013   Procedure: ESOPHAGOGASTRODUODENOSCOPY (EGD);  Surgeon: DLafayette Dragon MD;  [facility];  Service: Endoscopy;  Laterality: N/A;  . INSERT / REPLACE / REMOVE PACEMAKER  ~ 2004   initial pacemaker placement  . INSERT / REPLACE / REMOVE PACEMAKER  10/2009   generator change  . LUMBAR LAMINECTOMY/DECOMPRESSION MICRODISCECTOMY Right 03/23/2014   Procedure: LAMINECTOMY AND FORAMINOTOMY RIGHT LUMBAR THREE-FOUR,LUMBAR FOUR-FIVE, LUMBAR FIVE-SACRAL ONE;  Surgeon: HCharlie Pitter MD;  [facility];  Service: Neurosurgery;  Laterality: Right;  right  . LUMBAR LAMINECTOMY/DECOMPRESSION MICRODISCECTOMY Left 12/01/2014   Procedure: Left Lumbar Three-Four, Lumbar Four-Five Laminectomy and Foraminotomy;  Surgeon: HEarnie Larsson MD;  [facility];  Service: Neurosurgery;  Laterality: Left;  . s/p left arm surgury after work accident  1991   "2000# steel fell on it"  . s/p right hand surgury for foreign object  1970's   "piece of wood went in my hand; had to get that out"   Social History   Socioeconomic History  . Marital status: Married  Spouse name: Not on file  . Number of children: 2  . Years of education: Not on file  . Highest education level: Not on file  Occupational History  . Occupation: prior work Designer, industrial/product: UNEMPLOYED  . Occupation: disabled since 2004  Social Needs  . Financial resource strain: Not on file  . Food insecurity:    Worry: Not on file    Inability: Not on file  . Transportation needs:    Medical: Not on file    Non-medical: Not on file  Tobacco Use  . Smoking status: Former Smoker    Packs/day: 3.00    Years: 9.00    Pack years:  27.00    Types: Cigarettes    Last attempt to quit: 02/26/1973    Years since quitting: 45.1  . Smokeless tobacco: Former Systems developer  . Tobacco comment: quit smoking 61yr ago  Substance and Sexual Activity  . Alcohol use: No    Alcohol/week: 0.0 standard drinks  . Drug use: No    Comment: "used pouches of tobacco for a long time; quit those 12/09/1970"  . Sexual activity: Yes  Lifestyle  . Physical activity:    Days per week: Not on file    Minutes per session: Not on file  . Stress: Not on file  Relationships  . Social connections:    Talks on phone: Not on file    Gets together: Not on file    Attends religious service: Not on file    Active member of club or organization: Not on file    Attends meetings of clubs or organizations: Not on file    Relationship status: Not on file  Other Topics Concern  . Not on file  Social History Narrative  . Not on file   Allergies  Allergen Reactions  . Crestor [Rosuvastatin Calcium] Other (See Comments)    Urination of blood  . Latex Rash and Other (See Comments)    Gloves cause welts on hands!!   Family History  Problem Relation Age of Onset  . Diabetes Mother   . Diabetes Sister   . Heart disease Sister        2 sister died with heart disease  . Coronary artery disease Other 53      male, first degree relative  . Diabetes Other        1st degree relative  . Heart disease Sister   . Lung cancer Sister        deceased    Current Outpatient Medications (Endocrine & Metabolic):  .  glipiZIDE (GLUCOTROL XL) 10 MG 24 hr tablet, Take 1 tablet (10 mg total) by mouth daily with breakfast. .  metFORMIN (GLUCOPHAGE) 500 MG tablet, 4 tab by mouth daily .  sitaGLIPtin (JANUVIA) 100 MG tablet, Take 1 tablet (100 mg total) by mouth daily.  Current Outpatient Medications (Cardiovascular):  .  atorvastatin (LIPITOR) 80 MG tablet, Take 1 tablet (80 mg total) by mouth daily. .  carvedilol (COREG) 25 MG tablet, Take 1 tablet (25 mg total) by  mouth 2 (two) times daily. .  furosemide (LASIX) 40 MG tablet, Take 2 tablets (80 mg total) by mouth 2 (two) times daily. .Marland Kitchen lisinopril (PRINIVIL,ZESTRIL) 2.5 MG tablet, Take 1 tablet (2.5 mg total) by mouth daily.  Current Outpatient Medications (Respiratory):  .  albuterol (PROAIR HFA) 108 (90 Base) MCG/ACT inhaler, Inhale 2 puffs into the lungs every 6 (six) hours as needed for wheezing or  shortness of breath. .  benzonatate (TESSALON PERLES) 100 MG capsule, 1-2 tab by mouth every 6 hrs as needed for cough .  dextromethorphan-guaiFENesin (MUCINEX DM) 30-600 MG 12hr tablet, Take 1 tablet by mouth 2 (two) times daily. .  fluticasone (FLONASE) 50 MCG/ACT nasal spray, USE 2 SPRAYS IN EACH  NOSTRIL EVERY DAY  Current Outpatient Medications (Analgesics):  .  allopurinol (ZYLOPRIM) 300 MG tablet, Take 1 tablet (300 mg total) by mouth daily. Marland Kitchen  HYDROcodone-acetaminophen (NORCO) 10-325 MG tablet, Take 1 tablet by mouth daily as needed.  Current Outpatient Medications (Hematological):  .  warfarin (COUMADIN) 5 MG tablet, Take 1/2 tablet daily except 1 tablet on Mondays, Wednesdays and Fridays (Patient taking differently: Take 2.5-5 mg by mouth See admin instructions. Take 1/2 tablet daily except 1 tablet on Mondays, Wednesdays and Fridays)  Current Outpatient Medications (Other):  .  Blood Glucose Monitoring Suppl (ONE TOUCH ULTRA 2) w/Device KIT, Use as directed .  Cholecalciferol (VITAMIN D) 1000 UNITS capsule, Take 1,000 Units by mouth 2 (two) times daily. Reported on 02/15/2015 .  donepezil (ARICEPT) 5 MG tablet, Take 1 tablet (5 mg total) by mouth daily. Marland Kitchen  gabapentin (NEURONTIN) 300 MG capsule, TAKE 2 CAPSULES BY MOUTH 3  TIMES DAILY .  glucose blood (ONE TOUCH ULTRA TEST) test strip, CHECK BLOOD SUGAR TWO TIMES DAILY .  Multiple Vitamin (MULTIVITAMIN WITH MINERALS) TABS tablet, Take 1 tablet by mouth every morning. Glory Rosebush DELICA LANCETS 83A MISC, USE AS DIRECTED THREE TIMES DAILY TO CHECK  BLOOD SUGAR .  oxybutynin (DITROPAN XL) 10 MG 24 hr tablet, Take 1 tablet (10 mg total) by mouth at bedtime. .  potassium chloride SA (K-DUR,KLOR-CON) 20 MEQ tablet, Take 1 tablet (20 mEq total) by mouth every evening. .  tamsulosin (FLOMAX) 0.4 MG CAPS capsule, 1 tab by mouth twice per day .  tiZANidine (ZANAFLEX) 4 MG tablet, TAKE 1 TABLET BY MOUTH   EVERY 6 HOURS AS NEEDED FOR MUSCLE SPASMS.    Past medical history, social, surgical and family history all reviewed in electronic medical record.  No pertanent information unless stated regarding to the chief complaint.   Review of Systems:  No headache, visual changes, nausea, vomiting, diarrhea, constipation, dizziness, abdominal pain, skin rash, fevers, chills, night sweats, weight loss, swollen lymph nodes, body aches, joint swelling, chest pain, shortness of breath, mood changes.  Positive muscle aches  Objective  Blood pressure 130/70, height 5' 11" (1.803 m), weight 213 lb (96.6 kg). \   General: No apparent distress alert and oriented x3 mood and affect normal, dressed appropriately.   Gait antalgic gait uses the aid of a cane MSK: Arthritic changes of multiple joints Knee: Right valgus deformity noted.  Abnormal thigh to calf ratio.  Tender to palpation over medial and PF joint line.  ROM full in flexion and extension and lower leg rotation. instability with valgus force.  painful patellar compression. Patellar glide with moderate crepitus. Patellar and quadriceps tendons unremarkable. Hamstring and quadriceps strength is normal. Contralateral knee shows arthritic changes as well    Impression and Recommendations:     This case required medical decision making of moderate complexity. The above documentation has been reviewed and is accurate and complete Lyndal Pulley, DO       Note: This dictation was prepared with Dragon dictation along with smaller phrase technology. Any transcriptional errors that result from  this process are unintentional.

## 2018-04-10 NOTE — Patient Instructions (Signed)
Good to see you  Ice is your friend The kne looks great  B12 1063mcg daily with 200mg  of B6- look at the back of the pill bottle See me again in 2 months!

## 2018-04-10 NOTE — Assessment & Plan Note (Signed)
Patient has done significantly better though.  Still has some instability of the knee but full range of motion and no swelling noted as well.  Patient is doing much better.  Discussed icing regimen and home exercises topical anti-inflammatories, could be a candidate for Visco supplementation but as long as patient is well follow-up in 2 months

## 2018-04-19 ENCOUNTER — Telehealth: Payer: Self-pay | Admitting: *Deleted

## 2018-04-19 NOTE — Telephone Encounter (Signed)
1. Have you recently travelled abroad or to [place], [place], or [place]? No 2. Do you currently have a fever? No 3. Have you been in contact with someone that is currently pending confirmation of COVID19 testing or has been confirmed to have the COVID19 virus? No 4. Are you currently experiencing fatigue or cough? No 5. Are you currently experiencing new or worsening shortness of breath at rest or with minimal activity? No 6. Have you been in contact with someone that was recently sick with fever/cough/fatigue? No   **A score of 4 or more should result in cancellation of the pts cardiology appt  **A score of 2 should be provided a mask prior to admission into the lobby  **TRAVEL to a high risk area or contact with a confirmed case should stay at home, away from confirmed patient, monitor symptoms, and reach out to PCP for evisit, additional testing.   **ALL PTS WITH FEVER SHOULD BE REFERRED TO PCP FOR EVISIT  Pt. Advised that we are restricting visitors at this time and request that only patients present for check-in prior to their appointment. All other visitors should remain in their car. If necessary, only one visitor may come with the patient into the building. For everyone's safety, all patients and visitors entering our practice area should expect to be screened again prior to entering our waiting area.   

## 2018-04-22 ENCOUNTER — Ambulatory Visit (INDEPENDENT_AMBULATORY_CARE_PROVIDER_SITE_OTHER): Payer: Medicare Other | Admitting: Pharmacist

## 2018-04-22 ENCOUNTER — Other Ambulatory Visit: Payer: Self-pay

## 2018-04-22 DIAGNOSIS — Z7901 Long term (current) use of anticoagulants: Secondary | ICD-10-CM | POA: Diagnosis not present

## 2018-04-22 DIAGNOSIS — I4891 Unspecified atrial fibrillation: Secondary | ICD-10-CM | POA: Diagnosis not present

## 2018-04-22 DIAGNOSIS — I483 Typical atrial flutter: Secondary | ICD-10-CM | POA: Diagnosis not present

## 2018-04-22 LAB — POCT INR: INR: 2.6 (ref 2.0–3.0)

## 2018-04-22 NOTE — Patient Instructions (Signed)
Description   Continue taking 1 tablet each Monday, Wednesday and Friday, 1/2 tablet all other days.  Repeat INR in 6 weeks.

## 2018-05-27 ENCOUNTER — Other Ambulatory Visit: Payer: Self-pay

## 2018-05-27 NOTE — Patient Outreach (Signed)
[facility]) Care Management  05/27/2018  ARTHOR GORTER 1945-05-04 [id number]   Medication Adherence call to Mr. Kahlel Peake [facility] Verify spoke with patient he is due on Glipizide er 10 mg,Lisinopril 2.5 mg,Metformin 500 mg and Atorvastatin 80 mg he explain [facility] does not want to mail his medication because he owns money patient also said they send him a Proair inhaler that he never order and ask if he can send it back [facility] said they did not take medications back.[facility] said he owns $192 dollars and not until he pays this amount they will mail them out. Mr. Burleson is showing past due under [employer].  [facility] Management Direct Dial [phone]  Fax [phone] [email or web address]

## 2018-05-31 ENCOUNTER — Telehealth: Payer: Self-pay

## 2018-05-31 NOTE — Telephone Encounter (Signed)

## 2018-06-03 ENCOUNTER — Other Ambulatory Visit: Payer: Self-pay

## 2018-06-03 ENCOUNTER — Ambulatory Visit (INDEPENDENT_AMBULATORY_CARE_PROVIDER_SITE_OTHER): Payer: Medicare Other | Admitting: *Deleted

## 2018-06-03 DIAGNOSIS — I4891 Unspecified atrial fibrillation: Secondary | ICD-10-CM | POA: Diagnosis not present

## 2018-06-03 DIAGNOSIS — Z7901 Long term (current) use of anticoagulants: Secondary | ICD-10-CM

## 2018-06-03 DIAGNOSIS — I483 Typical atrial flutter: Secondary | ICD-10-CM

## 2018-06-03 LAB — POCT INR: INR: 3.3 — AB (ref 2.0–3.0)

## 2018-06-10 NOTE — Progress Notes (Signed)
[name] [place], [place] [address] Phone: [phone] Subjective:   I [name] am serving as a Education administrator for Dr. Hulan Saas.   CC: Knee pain follow-up  GPQ:[id number]   04/10/18 Patient has done significantly better though.  Still has some instability of the knee but full range of motion and no swelling noted as well.  Patient is doing much better.  Discussed icing regimen and home exercises topical anti-inflammatories, could be a candidate for Visco supplementation but as long as patient is well follow-up in 2 months  06/11/2018 [name] is a 73 y.o. male coming in with complaint of right knee pain. States that he has good and bad days.  Patient is actually complaining more of the left knee at the moment.  Severe overall.  Describes the pain as a dull, throbbing aching sensation.  Nothing severe. No activities.  Sometimes does affect going up or down stairs.  Walks with the aid of a cane     Past Medical History:  Diagnosis Date  . Aortic root dilatation ([place]) 02/02/2011  . Arthritis    "lower back; going back down both my sciatic nerves"  . 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 BENIGN PROSTATIC HYPERTROPHY 08/29/2006   takes Flomax daily  . 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 Cataracts, bilateral    immature  . CHF (congestive heart failure) (HCC)    takes Lasix daily  . Chronic back pain    HNP   . CORONARY ARTERY DISEASE 08/29/2006   takes Coumadin daily  . DEPRESSION 08/29/2006  . DIABETES MELLITUS, TYPE II 08/29/2006   takes Metformin,Januvia,and Glipizide  daily  . DIASTOLIC HEART FAILURE, CHRONIC 06/09/2008  . GOUT 04/22/2007   takes Allopurinol daily  . History of migraine    72yr ago  . HYPERLIPIDEMIA 08/29/2006   takes Atorvastatin daily  . HYPERTENSION 08/29/2006   takes Lisinopril daily  . INSOMNIA-SLEEP DISORDER-UNSPEC 10/23/2007  . Left lumbar radiculopathy 05/30/2010  . LUMBAR  RADICULOPATHY, RIGHT 06/10/2007  . Muscle spasm    takes Zanaflex daily  . Myocardial infarction ([place] 12/27/10   "I've had several MIs"  . 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 PACEMAKER, PERMANENT 03/04/2008   pt denies this date  . Peripheral neuropathy    takes Gabapentin daily  . Presence of permanent cardiac pacemaker   . Shortness of breath dyspnea    "all my life" with exertion  . Sleep apnea    "if I lay flat I quit breathing; HOB up I'm fine"   Past Surgical History:  Procedure Laterality Date  . COLONOSCOPY    . CORONARY ANGIOPLASTY WITH STENT PLACEMENT  12/27/10   "I've had a total of 9 cardiac stents put in"  . CORONARY ARTERY BYPASS GRAFT  1992   CABG X 2  . ESOPHAGOGASTRODUODENOSCOPY N/A 12/01/2013   Procedure: ESOPHAGOGASTRODUODENOSCOPY (EGD);  Surgeon: DLafayette Dragon MD;  [facility];  Service: Endoscopy;  Laterality: N/A;  . INSERT / REPLACE / REMOVE PACEMAKER  ~ 2004   initial pacemaker placement  . INSERT / REPLACE / REMOVE PACEMAKER  10/2009   generator change  . LUMBAR LAMINECTOMY/DECOMPRESSION MICRODISCECTOMY Right 03/23/2014   Procedure: LAMINECTOMY AND FORAMINOTOMY RIGHT LUMBAR THREE-FOUR,LUMBAR FOUR-FIVE, LUMBAR FIVE-SACRAL ONE;  Surgeon: HCharlie Pitter MD;  [facility];  Service: Neurosurgery;  Laterality: Right;  right  . LUMBAR LAMINECTOMY/DECOMPRESSION MICRODISCECTOMY Left 12/01/2014   Procedure: Left Lumbar Three-Four, Lumbar Four-Five  Laminectomy and Foraminotomy;  Surgeon: Earnie Larsson, MD;  Location: [facility];  Service: Neurosurgery;  Laterality: Left;  . s/p left arm surgury after work accident  1991   "2000# steel fell on it"  . s/p right hand surgury for foreign object  1970's   "piece of wood went in my hand; had to get that out"   Social History   Socioeconomic History  . Marital status: Married    Spouse name: Not on file  . Number of children: 2  . Years of education: Not on file  . Highest education level:  Not on file  Occupational History  . Occupation: prior work Designer, industrial/product: UNEMPLOYED  . Occupation: disabled since 2004  Social Needs  . Financial resource strain: Not on file  . Food insecurity:    Worry: Not on file    Inability: Not on file  . Transportation needs:    Medical: Not on file    Non-medical: Not on file  Tobacco Use  . Smoking status: Former Smoker    Packs/day: 3.00    Years: 9.00    Pack years: 27.00    Types: Cigarettes    Last attempt to quit: 02/26/1973    Years since quitting: 45.3  . Smokeless tobacco: Former Systems developer  . Tobacco comment: quit smoking 93yr ago  Substance and Sexual Activity  . Alcohol use: No    Alcohol/week: 0.0 standard drinks  . Drug use: No    Comment: "used pouches of tobacco for a long time; quit those 12/09/1970"  . Sexual activity: Yes  Lifestyle  . Physical activity:    Days per week: Not on file    Minutes per session: Not on file  . Stress: Not on file  Relationships  . Social connections:    Talks on phone: Not on file    Gets together: Not on file    Attends religious service: Not on file    Active member of club or organization: Not on file    Attends meetings of clubs or organizations: Not on file    Relationship status: Not on file  Other Topics Concern  . Not on file  Social History Narrative  . Not on file   Allergies  Allergen Reactions  . Crestor [Rosuvastatin Calcium] Other (See Comments)    Urination of blood  . Latex Rash and Other (See Comments)    Gloves cause welts on hands!!   Family History  Problem Relation Age of Onset  . Diabetes Mother   . Diabetes Sister   . Heart disease Sister        2 sister died with heart disease  . Coronary artery disease Other 531      male, first degree relative  . Diabetes Other        1st degree relative  . Heart disease Sister   . Lung cancer Sister        deceased    Current Outpatient Medications (Endocrine & Metabolic):  .  glipiZIDE  (GLUCOTROL XL) 10 MG 24 hr tablet, Take 1 tablet (10 mg total) by mouth daily with breakfast. .  metFORMIN (GLUCOPHAGE) 500 MG tablet, 4 tab by mouth daily .  sitaGLIPtin (JANUVIA) 100 MG tablet, Take 1 tablet (100 mg total) by mouth daily.  Current Outpatient Medications (Cardiovascular):  .  atorvastatin (LIPITOR) 80 MG tablet, Take 1 tablet (80 mg total) by mouth daily. .  carvedilol (COREG) 25 MG tablet,  Take 1 tablet (25 mg total) by mouth 2 (two) times daily. .  furosemide (LASIX) 40 MG tablet, Take 2 tablets (80 mg total) by mouth 2 (two) times daily. Marland Kitchen  lisinopril (PRINIVIL,ZESTRIL) 2.5 MG tablet, Take 1 tablet (2.5 mg total) by mouth daily.  Current Outpatient Medications (Respiratory):  .  albuterol (PROAIR HFA) 108 (90 Base) MCG/ACT inhaler, Inhale 2 puffs into the lungs every 6 (six) hours as needed for wheezing or shortness of breath. .  benzonatate (TESSALON PERLES) 100 MG capsule, 1-2 tab by mouth every 6 hrs as needed for cough .  dextromethorphan-guaiFENesin (MUCINEX DM) 30-600 MG 12hr tablet, Take 1 tablet by mouth 2 (two) times daily. .  fluticasone (FLONASE) 50 MCG/ACT nasal spray, USE 2 SPRAYS IN EACH  NOSTRIL EVERY DAY  Current Outpatient Medications (Analgesics):  .  allopurinol (ZYLOPRIM) 300 MG tablet, Take 1 tablet (300 mg total) by mouth daily. Marland Kitchen  HYDROcodone-acetaminophen (NORCO) 10-325 MG tablet, Take 1 tablet by mouth daily as needed.  Current Outpatient Medications (Hematological):  .  warfarin (COUMADIN) 5 MG tablet, Take 1/2 tablet daily except 1 tablet on Mondays, Wednesdays and Fridays (Patient taking differently: Take 2.5-5 mg by mouth See admin instructions. Take 1/2 tablet daily except 1 tablet on Mondays, Wednesdays and Fridays)  Current Outpatient Medications (Other):  .  Blood Glucose Monitoring Suppl (ONE TOUCH ULTRA 2) w/Device KIT, Use as directed .  Cholecalciferol (VITAMIN D) 1000 UNITS capsule, Take 1,000 Units by mouth 2 (two) times daily.  Reported on 02/15/2015 .  donepezil (ARICEPT) 5 MG tablet, Take 1 tablet (5 mg total) by mouth daily. Marland Kitchen  gabapentin (NEURONTIN) 300 MG capsule, TAKE 2 CAPSULES BY MOUTH 3  TIMES DAILY .  glucose blood (ONE TOUCH ULTRA TEST) test strip, CHECK BLOOD SUGAR TWO TIMES DAILY .  Multiple Vitamin (MULTIVITAMIN WITH MINERALS) TABS tablet, Take 1 tablet by mouth every morning. Glory Rosebush DELICA LANCETS 95M MISC, USE AS DIRECTED THREE TIMES DAILY TO CHECK BLOOD SUGAR .  oxybutynin (DITROPAN XL) 10 MG 24 hr tablet, Take 1 tablet (10 mg total) by mouth at bedtime. .  potassium chloride SA (K-DUR,KLOR-CON) 20 MEQ tablet, Take 1 tablet (20 mEq total) by mouth every evening. .  tamsulosin (FLOMAX) 0.4 MG CAPS capsule, 1 tab by mouth twice per day .  tiZANidine (ZANAFLEX) 4 MG tablet, TAKE 1 TABLET BY MOUTH   EVERY 6 HOURS AS NEEDED FOR MUSCLE SPASMS.    Past medical history, social, surgical and family history all reviewed in electronic medical record.  No pertanent information unless stated regarding to the chief complaint.   Review of Systems:  No headache, visual changes, nausea, vomiting, diarrhea, constipation, dizziness, abdominal pain, skin rash, fevers, chills, night sweats, weight loss, swollen lymph nodes, body aches, joint swelling, chest pain, shortness of breath, mood changes. Positive muscle aches   Objective  Blood pressure 122/70, pulse 86, height '5\' 11"'  (1.803 m), weight 209 lb (94.8 kg), SpO2 95 %.    General: No apparent distress alert and oriented x3 mood and affect normal, dressed appropriately.  HEENT: Pupils equal, extraocular movements intact  Respiratory: Patient's speak in full sentences and does not appear short of breath  Cardiovascular: tracelower extremity edema, non tender, no erythema  Skin: Warm dry intact with no signs of infection or rash on extremities or on axial skeleton.  Abdomen: Soft nontender  Neuro: Cranial nerves II through XII are intact, neurovascularly  intact in all extremities with 2+ DTRs and 2+  pulses.  Lymph: No lymphadenopathy of posterior or anterior cervical chain or axillae bilaterally.  Gait antalgic gait walks with aid of cane.  MSK:  tender with full range of motion and good stability and symmetric strength and tone of shoulders, elbows, wrist, hip and ankles bilaterally.  Arthritic changes of multiple joints  Knee:left  valgus deformity noted. Large thigh to calf ratio.  Tender to palpation over medial and PF joint line.  ROM full in flexion and extension and lower leg rotation. instability with valgus force.  painful patellar compression. Patellar glide with moderate crepitus. Patellar and quadriceps tendons unremarkable. Hamstring and quadriceps strength is normal. Contralateral knee shows moderate arthritic changes as well   After informed written and verbal consent, patient was seated on exam table. Left knee was prepped with alcohol swab and utilizing anterolateral approach, patient's left knee space was injected with 4:1  marcaine 0.5%: Kenalog 23m/dL. Patient tolerated the procedure well without immediate complications.    Impression and Recommendations:     This case required medical decision making of moderate complexity. The above documentation has been reviewed and is accurate and complete ZLyndal Pulley DO       Note: This dictation was prepared with Dragon dictation along with smaller phrase technology. Any transcriptional errors that result from this process are unintentional.

## 2018-06-11 ENCOUNTER — Ambulatory Visit (INDEPENDENT_AMBULATORY_CARE_PROVIDER_SITE_OTHER): Payer: Medicare Other | Admitting: Family Medicine

## 2018-06-11 ENCOUNTER — Encounter: Payer: Self-pay | Admitting: Family Medicine

## 2018-06-11 ENCOUNTER — Other Ambulatory Visit: Payer: Self-pay

## 2018-06-11 DIAGNOSIS — M25562 Pain in left knee: Secondary | ICD-10-CM

## 2018-06-11 NOTE — Assessment & Plan Note (Signed)
Worsening arthritis in the right knee but does have some of the left knee.  Injection given today.  I do believe the left side of the leg has some weakness for more of a lumbar radiculopathy that have been longstanding.  We discussed the right way to use his cane.  Encouraged continued use the gabapentin.  Hopefully patient does well with the injection.  Follow-up again in 4 weeks

## 2018-06-11 NOTE — Patient Instructions (Signed)
Good to see you  Injected the knee again today  Stay active and be safe See me again in 3 months

## 2018-06-18 ENCOUNTER — Telehealth: Payer: Self-pay | Admitting: Internal Medicine

## 2018-06-18 NOTE — Telephone Encounter (Signed)
Patient called stating that he received a recall for a device check in August. Patient is requesting a telephone call.

## 2018-06-18 NOTE — Telephone Encounter (Signed)
Spoke w/ pt and informed him that I would send scheduler would call him to schedule an appt w/ DC. Pt verbalized understanding.

## 2018-06-20 ENCOUNTER — Other Ambulatory Visit: Payer: Self-pay | Admitting: Cardiovascular Disease

## 2018-06-28 ENCOUNTER — Telehealth: Payer: Self-pay

## 2018-06-28 NOTE — Telephone Encounter (Signed)

## 2018-07-01 ENCOUNTER — Other Ambulatory Visit: Payer: Self-pay

## 2018-07-01 ENCOUNTER — Ambulatory Visit (INDEPENDENT_AMBULATORY_CARE_PROVIDER_SITE_OTHER): Payer: Medicare Other | Admitting: *Deleted

## 2018-07-01 DIAGNOSIS — Z7901 Long term (current) use of anticoagulants: Secondary | ICD-10-CM

## 2018-07-01 DIAGNOSIS — I4891 Unspecified atrial fibrillation: Secondary | ICD-10-CM | POA: Diagnosis not present

## 2018-07-01 DIAGNOSIS — I483 Typical atrial flutter: Secondary | ICD-10-CM

## 2018-07-01 LAB — POCT INR: INR: 2.4 (ref 2.0–3.0)

## 2018-07-01 NOTE — Patient Instructions (Signed)
Description   Continue taking 1 tablet each Monday, Wednesday and Friday, 1/2 tablet all other days.  Repeat INR in 5 weeks.

## 2018-07-09 ENCOUNTER — Encounter: Payer: Self-pay | Admitting: Internal Medicine

## 2018-07-09 ENCOUNTER — Ambulatory Visit (INDEPENDENT_AMBULATORY_CARE_PROVIDER_SITE_OTHER): Payer: Medicare Other | Admitting: Internal Medicine

## 2018-07-09 DIAGNOSIS — L609 Nail disorder, unspecified: Secondary | ICD-10-CM

## 2018-07-09 DIAGNOSIS — R1310 Dysphagia, unspecified: Secondary | ICD-10-CM

## 2018-07-09 DIAGNOSIS — F329 Major depressive disorder, single episode, unspecified: Secondary | ICD-10-CM | POA: Diagnosis not present

## 2018-07-09 DIAGNOSIS — G4700 Insomnia, unspecified: Secondary | ICD-10-CM | POA: Diagnosis not present

## 2018-07-09 DIAGNOSIS — F32A Depression, unspecified: Secondary | ICD-10-CM

## 2018-07-09 MED ORDER — TRAZODONE HCL 50 MG PO TABS: 25.0000 mg | ORAL_TABLET | Freq: Every evening | ORAL | 1 refills | Status: DC | PRN

## 2018-07-09 NOTE — Patient Instructions (Signed)
Please take all new medication as prescribed - the trazodone as needed for sleep  Please continue all other medications as before, and refills have been done if requested.  Please have the pharmacy call with any other refills you may need.  Please continue your efforts at being more active, low cholesterol diet, and weight control.  Please keep your appointments with your specialists as you may have planned  You will be contacted regarding the referral for: Gastroenterology, and Podiatry

## 2018-07-09 NOTE — Progress Notes (Signed)
Patient ID: [name], male   DOB: [birth year], 73 y.o.   MRN: [id number]  Virtual Visit via Video Note  I connected with [name] on 07/09/18 at  7:20 PM EDT by a video enabled telemedicine application and verified that I am speaking with the correct person using two identifiers.  Location: Patient: at home Provider: at home   I discussed the limitations of evaluation and management by telemedicine and the availability of in person appointments. The patient expressed understanding and agreed to proceed.  History of Present Illness: Here with c/o worsening mod to severe dysphagia for several weeks, and includes vomiting up foodstuff at least "9 times out of 10".  Denies worsening reflux, abd pain, n/v, bowel change or blood.  No recent EGD or hx of esophageal disease.  Pt denies chest pain, increased sob or doe, wheezing, orthopnea, PND, increased LE swelling, palpitations, dizziness or syncope.   Pt denies fever, wt loss, night sweats, loss of appetite, or other constitutional symptoms, does not think he has aspirated.  Does also have worsening nail deformations, and asks for change in podiatrist.  Also c/o persistent difficulty with getting to sleep almost every night, is on multiple meds and trying not to use OTCs.   Denies worsening depressive symptoms, suicidal ideation, or panic; Past Medical History:  Diagnosis Date  . Aortic root dilatation ([place]) 02/02/2011  . Arthritis    "lower back; going back down both my sciatic nerves"  . 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 BENIGN PROSTATIC HYPERTROPHY 08/29/2006   takes Flomax daily  . 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 Cataracts, bilateral    immature  . CHF (congestive heart failure) ([facility])    takes Lasix daily  . Chronic back pain    HNP   . CORONARY ARTERY DISEASE 08/29/2006   takes Coumadin daily  . DEPRESSION 08/29/2006  . DIABETES MELLITUS, TYPE II 08/29/2006   takes Metformin,Januvia,and Glipizide   daily  . DIASTOLIC HEART FAILURE, CHRONIC 06/09/2008  . GOUT 04/22/2007   takes Allopurinol daily  . History of migraine    10yr ago  . HYPERLIPIDEMIA 08/29/2006   takes Atorvastatin daily  . HYPERTENSION 08/29/2006   takes Lisinopril daily  . INSOMNIA-SLEEP DISORDER-UNSPEC 10/23/2007  . Left lumbar radiculopathy 05/30/2010  . LUMBAR RADICULOPATHY, RIGHT 06/10/2007  . Muscle spasm    takes Zanaflex daily  . [place] infarction [place] 12/27/10   "I've had several MIs"  . 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 PACEMAKER, PERMANENT 03/04/2008   pt denies this date  . Peripheral neuropathy    takes Gabapentin daily  . Presence of permanent cardiac pacemaker   . Shortness of breath dyspnea    "all my life" with exertion  . Sleep apnea    "if I lay flat I quit breathing; HOB up I'm fine"   Past Surgical History:  Procedure Laterality Date  . COLONOSCOPY    . CORONARY ANGIOPLASTY WITH STENT PLACEMENT  12/27/10   "I've had a total of 9 cardiac stents put in"  . CORONARY ARTERY BYPASS GRAFT  1992   CABG X 2  . ESOPHAGOGASTRODUODENOSCOPY N/A 12/01/2013   Procedure: ESOPHAGOGASTRODUODENOSCOPY (EGD);  Surgeon: DLafayette Dragon MD;  [facility];  Service: Endoscopy;  Laterality: N/A;  . INSERT / REPLACE / REMOVE PACEMAKER  ~ 2004   initial pacemaker placement  . INSERT / REPLACE / REMOVE PACEMAKER  10/2009   generator change  . LUMBAR LAMINECTOMY/DECOMPRESSION MICRODISCECTOMY Right 03/23/2014  Procedure: LAMINECTOMY AND FORAMINOTOMY RIGHT LUMBAR THREE-FOUR,LUMBAR FOUR-FIVE, LUMBAR FIVE-SACRAL ONE;  Surgeon: Charlie Pitter, MD;  Location: [facility];  Service: Neurosurgery;  Laterality: Right;  right  . LUMBAR LAMINECTOMY/DECOMPRESSION MICRODISCECTOMY Left 12/01/2014   Procedure: Left Lumbar Three-Four, Lumbar Four-Five Laminectomy and Foraminotomy;  Surgeon: Earnie Larsson, MD;  Location: [facility];  Service: Neurosurgery;  Laterality: Left;  . s/p left arm surgury after work  accident  1991   "2000# steel fell on it"  . s/p right hand surgury for foreign object  1970's   "piece of wood went in my hand; had to get that out"    reports that he quit smoking about 45 years ago. His smoking use included cigarettes. He has a 27.00 pack-year smoking history. He has quit using smokeless tobacco. He reports that he does not drink alcohol or use drugs. family history includes Coronary artery disease (age of onset: 39) in an other family member; Diabetes in his mother, sister, and another family member; Heart disease in his sister and sister; Lung cancer in his sister. Allergies  Allergen Reactions  . Crestor [Rosuvastatin Calcium] Other (See Comments)    Urination of blood  . Latex Rash and Other (See Comments)    Gloves cause welts on hands!!   Current Outpatient Medications on File Prior to Visit  Medication Sig Dispense Refill  . albuterol (PROAIR HFA) 108 (90 Base) MCG/ACT inhaler Inhale 2 puffs into the lungs every 6 (six) hours as needed for wheezing or shortness of breath. 3 Inhaler 3  . allopurinol (ZYLOPRIM) 300 MG tablet Take 1 tablet (300 mg total) by mouth daily. 90 tablet 3  . atorvastatin (LIPITOR) 80 MG tablet Take 1 tablet (80 mg total) by mouth daily. 90 tablet 3  . benzonatate (TESSALON PERLES) 100 MG capsule 1-2 tab by mouth every 6 hrs as needed for cough 60 capsule 1  . Blood Glucose Monitoring Suppl (ONE TOUCH ULTRA 2) w/Device KIT Use as directed 1 each 3  . carvedilol (COREG) 25 MG tablet Take 1 tablet (25 mg total) by mouth 2 (two) times daily. 180 tablet 3  . Cholecalciferol (VITAMIN D) 1000 UNITS capsule Take 1,000 Units by mouth 2 (two) times daily. Reported on 02/15/2015    . dextromethorphan-guaiFENesin (MUCINEX DM) 30-600 MG 12hr tablet Take 1 tablet by mouth 2 (two) times daily. 30 tablet 0  . donepezil (ARICEPT) 5 MG tablet Take 1 tablet (5 mg total) by mouth daily. 90 tablet 3  . fluticasone (FLONASE) 50 MCG/ACT nasal spray USE 2 SPRAYS IN  EACH  NOSTRIL EVERY DAY 48 g 0  . furosemide (LASIX) 40 MG tablet Take 2 tablets (80 mg total) by mouth 2 (two) times daily. 720 tablet 3  . gabapentin (NEURONTIN) 300 MG capsule TAKE 2 CAPSULES BY MOUTH 3  TIMES DAILY 540 capsule 3  . glipiZIDE (GLUCOTROL XL) 10 MG 24 hr tablet Take 1 tablet (10 mg total) by mouth daily with breakfast. 90 tablet 3  . glucose blood (ONE TOUCH ULTRA TEST) test strip CHECK BLOOD SUGAR TWO TIMES DAILY 100 each 3  . HYDROcodone-acetaminophen (NORCO) 10-325 MG tablet Take 1 tablet by mouth daily as needed. 30 tablet 0  . lisinopril (PRINIVIL,ZESTRIL) 2.5 MG tablet Take 1 tablet (2.5 mg total) by mouth daily. 90 tablet 3  . metFORMIN (GLUCOPHAGE) 500 MG tablet 4 tab by mouth daily 360 tablet 3  . Multiple Vitamin (MULTIVITAMIN WITH MINERALS) TABS tablet Take 1 tablet by mouth every morning.    Marland Kitchen  ONETOUCH DELICA LANCETS 96E MISC USE AS DIRECTED THREE TIMES DAILY TO CHECK BLOOD SUGAR 300 each 0  . oxybutynin (DITROPAN XL) 10 MG 24 hr tablet Take 1 tablet (10 mg total) by mouth at bedtime. 90 tablet 3  . potassium chloride SA (K-DUR,KLOR-CON) 20 MEQ tablet Take 1 tablet (20 mEq total) by mouth every evening. 90 tablet 3  . sitaGLIPtin (JANUVIA) 100 MG tablet Take 1 tablet (100 mg total) by mouth daily. 90 tablet 3  . tamsulosin (FLOMAX) 0.4 MG CAPS capsule 1 tab by mouth twice per day 180 capsule 3  . tiZANidine (ZANAFLEX) 4 MG tablet TAKE 1 TABLET BY MOUTH   EVERY 6 HOURS AS NEEDED FOR MUSCLE SPASMS. 180 tablet 2  . warfarin (COUMADIN) 5 MG tablet TAKE 1/2 TO 1 TABLET BY MOUTH DAILY AS DIRECTED BY COUMADIN CLINIC 90 tablet 1   No current facility-administered medications on file prior to visit.     Observations/Objective: Alert, NAD, appropriate mood and affect, resps normal, cn 2-12 intact, moves all 4s, no visible rash or swelling Lab Results  Component Value Date   WBC 8.2 01/31/2018   HGB 9.9 (L) 01/31/2018   HCT 31.1 (L) 01/31/2018   PLT 195.0 01/31/2018    GLUCOSE 87 01/31/2018   CHOL 81 01/31/2018   TRIG 54.0 01/31/2018   HDL 32.60 (L) 01/31/2018   LDLCALC 38 01/31/2018   ALT 25 01/31/2018   AST 30 01/31/2018   NA 137 01/31/2018   K 4.3 01/31/2018   CL 97 01/31/2018   CREATININE 1.29 01/31/2018   BUN 23 01/31/2018   CO2 33 (H) 01/31/2018   TSH 1.85 09/06/2017   PSA 5.07 (H) 09/06/2017   INR 2.4 07/01/2018   HGBA1C 7.3 (H) 01/31/2018   MICROALBUR 3.1 (H) 09/24/2017   Assessment and Plan: See notes  Follow Up Instructions: See notes   I discussed the assessment and treatment plan with the patient. The patient was provided an opportunity to ask questions and all were answered. The patient agreed with the plan and demonstrated an understanding of the instructions.   The patient was advised to call back or seek an in-person evaluation if the symptoms worsen or if the condition fails to improve as anticipated.  Cathlean Cower, MD

## 2018-07-10 ENCOUNTER — Encounter: Payer: Self-pay | Admitting: Internal Medicine

## 2018-07-10 DIAGNOSIS — L609 Nail disorder, unspecified: Secondary | ICD-10-CM | POA: Insufficient documentation

## 2018-07-10 NOTE — Assessment & Plan Note (Signed)
Mild to mod, for trazodone qhs prn,,  to f/u any worsening symptoms or concerns

## 2018-07-10 NOTE — Assessment & Plan Note (Signed)
With worsening freq and severity - for GI referral, may need EGD

## 2018-07-10 NOTE — Assessment & Plan Note (Signed)
stable overall by history and exam, recent data reviewed with pt, and pt to continue medical treatment as before,  to f/u any worsening symptoms or concerns  

## 2018-07-10 NOTE — Assessment & Plan Note (Signed)
For podiatry referral 

## 2018-07-19 ENCOUNTER — Telehealth: Payer: Self-pay | Admitting: Internal Medicine

## 2018-07-19 MED ORDER — FLUTICASONE PROPIONATE 50 MCG/ACT NA SUSP: 2.0000 | Freq: Every day | NASAL | 5 refills | Status: AC

## 2018-07-19 NOTE — Telephone Encounter (Signed)
RX REFILL fluticasone (FLONASE) 50 MCG/ACT nasal spray   Pharmacy: [place], [place] [phone] (Phone) [phone] (Fax)

## 2018-07-23 ENCOUNTER — Telehealth: Payer: Self-pay | Admitting: Internal Medicine

## 2018-07-23 MED ORDER — HYDROCODONE-ACETAMINOPHEN 10-325 MG PO TABS: 1.0000 | ORAL_TABLET | Freq: Every day | ORAL | 0 refills | Status: DC | PRN

## 2018-07-23 NOTE — Telephone Encounter (Signed)
Done erx 

## 2018-07-23 NOTE — Telephone Encounter (Signed)
Medication Refill - Medication: HYDROcodone-acetaminophen (NORCO) 10-325 MG tablet    Has the patient contacted their pharmacy? Yes.   (Agent: If no, request that the patient contact the pharmacy for the refill.) (Agent: If yes, when and what did the pharmacy advise?)  Preferred Pharmacy (with phone number or street name):  [id number] - [place], [place]  [address] [place] [address]  Phone: [phone] Fax: [phone]  Not a 24 hour pharmacy; exact hours not known.     Agent: Please be advised that RX refills may take up to 3 business days. We ask that you follow-up with your pharmacy.

## 2018-07-25 ENCOUNTER — Telehealth: Payer: Self-pay | Admitting: Internal Medicine

## 2018-07-25 NOTE — Telephone Encounter (Signed)
Patient is request a letter stating he does not have to wear a mask due to his underlying conditions,  Patient states since [place] is require to wear a mask, governor stated people with breathing issues do not have to wear a mask but need a letter from PCP.  Patient would like to come pick it up. Patient call back # [phone]

## 2018-07-25 NOTE — Telephone Encounter (Signed)
I decline, pt does not have lung disease, and if he takes all meds as prescribed for his other conditions, he would not need the mask letter  Pt should instead wear the mask due to his high risk of underlying co-morbidities if he should become infected

## 2018-07-29 ENCOUNTER — Telehealth: Payer: Self-pay

## 2018-07-29 ENCOUNTER — Encounter: Payer: Self-pay | Admitting: Internal Medicine

## 2018-07-29 NOTE — Telephone Encounter (Signed)
Called pt, LVM.   

## 2018-07-29 NOTE — Telephone Encounter (Signed)

## 2018-08-05 ENCOUNTER — Other Ambulatory Visit: Payer: Self-pay

## 2018-08-05 ENCOUNTER — Encounter: Payer: Self-pay | Admitting: Cardiology

## 2018-08-05 ENCOUNTER — Ambulatory Visit (INDEPENDENT_AMBULATORY_CARE_PROVIDER_SITE_OTHER): Payer: Medicare Other | Admitting: Pharmacist

## 2018-08-05 DIAGNOSIS — Z7901 Long term (current) use of anticoagulants: Secondary | ICD-10-CM

## 2018-08-05 DIAGNOSIS — I483 Typical atrial flutter: Secondary | ICD-10-CM | POA: Diagnosis not present

## 2018-08-05 DIAGNOSIS — I4891 Unspecified atrial fibrillation: Secondary | ICD-10-CM | POA: Diagnosis not present

## 2018-08-05 LAB — PROTIME-INR
INR: 6.1 (ref 0.8–1.2)
Prothrombin Time: 64.8 s — ABNORMAL HIGH (ref 9.1–12.0)

## 2018-08-05 LAB — POCT INR: INR: 6.1 — AB (ref 2.0–3.0)

## 2018-08-07 ENCOUNTER — Telehealth: Payer: Self-pay | Admitting: Internal Medicine

## 2018-08-07 NOTE — Telephone Encounter (Signed)
Pt called stating he's having problems with his device, stated it was hard to explain what was going on. Pt did stat he was having some chest pain and SOB off and on.   Please give pt a call [phone]

## 2018-08-07 NOTE — Telephone Encounter (Signed)
Spoke with patient. He is concerned about symptoms of a sharp pain in his chest lasting up to a couple of minutes, followed by [facility] for 1-2 min after ("feels like the wind was knocked out of me"). Pain extends across chest from left chest (near PPM) to his right shoulder. Occurs mostly at rest, off and on for about a month.  Pt reports ShOB is near baseline other than these episodes, compliant with cardiac medications. Doesn't feel he is fluid overloaded. Pt is overdue for f/u with Dr. Stanford Breed, but is also requesting a PPM check. No availability this week in [facility]. Advised I will route this message to Dr. Stanford Breed for recommendations. Should be able to coordinate industry rep/[facility] for pacer check with a provider visit. Pt continues to decline a remote monitor. ED precautions given for worsening cardiac symptoms. Pt verbalizes understanding and agreement with plan.

## 2018-08-08 ENCOUNTER — Other Ambulatory Visit: Payer: Self-pay

## 2018-08-08 ENCOUNTER — Encounter: Payer: Self-pay | Admitting: Physician Assistant

## 2018-08-08 ENCOUNTER — Telehealth: Payer: Self-pay

## 2018-08-08 ENCOUNTER — Ambulatory Visit (INDEPENDENT_AMBULATORY_CARE_PROVIDER_SITE_OTHER): Payer: Medicare Other | Admitting: Physician Assistant

## 2018-08-08 VITALS — BP 122/62 | HR 66 | Ht 71.0 in | Wt 210.0 lb

## 2018-08-08 DIAGNOSIS — N179 Acute kidney failure, unspecified: Secondary | ICD-10-CM | POA: Diagnosis not present

## 2018-08-08 DIAGNOSIS — E1140 Type 2 diabetes mellitus with diabetic neuropathy, unspecified: Secondary | ICD-10-CM | POA: Diagnosis not present

## 2018-08-08 DIAGNOSIS — I483 Typical atrial flutter: Secondary | ICD-10-CM

## 2018-08-08 DIAGNOSIS — I5033 Acute on chronic diastolic (congestive) heart failure: Secondary | ICD-10-CM | POA: Diagnosis not present

## 2018-08-08 DIAGNOSIS — E875 Hyperkalemia: Secondary | ICD-10-CM

## 2018-08-08 DIAGNOSIS — I5032 Chronic diastolic (congestive) heart failure: Secondary | ICD-10-CM | POA: Diagnosis not present

## 2018-08-08 DIAGNOSIS — IMO0002 Reserved for concepts with insufficient information to code with codable children: Secondary | ICD-10-CM

## 2018-08-08 DIAGNOSIS — N182 Chronic kidney disease, stage 2 (mild): Secondary | ICD-10-CM

## 2018-08-08 DIAGNOSIS — E1165 Type 2 diabetes mellitus with hyperglycemia: Secondary | ICD-10-CM

## 2018-08-08 DIAGNOSIS — I10 Essential (primary) hypertension: Secondary | ICD-10-CM | POA: Diagnosis not present

## 2018-08-08 DIAGNOSIS — I4891 Unspecified atrial fibrillation: Secondary | ICD-10-CM | POA: Diagnosis not present

## 2018-08-08 LAB — BASIC METABOLIC PANEL
BUN/Creatinine Ratio: 13 (ref 10–24)
BUN: 11 mg/dL (ref 8–27)
CO2: 21 mmol/L (ref 20–29)
Calcium: 8.6 mg/dL (ref 8.6–10.2)
Chloride: 109 mmol/L — ABNORMAL HIGH (ref 96–106)
Creatinine, Ser: 0.86 mg/dL (ref 0.76–1.27)
GFR calc Af Amer: 100 mL/min/{1.73_m2} (ref >59)
GFR calc non Af Amer: 87 mL/min/{1.73_m2} (ref >59)
Glucose: 132 mg/dL — ABNORMAL HIGH (ref 65–99)
Potassium: 5.8 mmol/L (ref 3.5–5.2)
Sodium: 142 mmol/L (ref 134–144)

## 2018-08-08 MED ORDER — METOLAZONE 2.5 MG PO TABS: 2.5000 mg | ORAL_TABLET | Freq: Every day | ORAL | 0 refills | Status: DC

## 2018-08-08 NOTE — Patient Instructions (Signed)
Medication Instructions:  START Metolazone 2.5 mg daily for two days. Take 1 extra potassium tablet with each dose of Metolazone.  If you need a refill on your cardiac medications before your next appointment, please call your pharmacy.   Lab work: Your physician recommends that you return for lab work today: BMET  Your physician recommends that you return for repeat lab work when you come back on 7/17 for your Coumadin appointment: BMET  If you have labs (blood work) drawn today and your tests are completely normal, you will receive your results only by: Marland Kitchen MyChart Message (if you have MyChart) OR . A paper copy in the mail If you have any lab test that is abnormal or we need to change your treatment, we will call you to review the results.  Follow-Up: At [facility], you and your health needs are our priority.  As part of our continuing mission to provide you with exceptional heart care, we have created designated Provider Care Teams.  These Care Teams include your primary Cardiologist (physician) and Advanced Practice Providers (APPs -  Physician Assistants and Nurse Practitioners) who all work together to provide you with the care you need, when you need it. . You have been scheduled for a follow-up appointment with Dr. Stanford Breed on Tuesday, 09/24/18 at 2:00 PM.  Any Other Special Instructions Will Be Listed Below (If Applicable). Your physician recommends that you weigh, daily, at the same time every day, and in the same amount of clothing. Please record your daily weights and bring to your next appointment.  Please either send a message through MyChart or call our office to let Suanne Marker know how you are doing in about 1 week.  Suanne Marker says it is ok to use a stationary peddler as long as you are not short of breath when you are using it.  Please limit fluid intake to 1 & 1/2 quarts or liters daily.

## 2018-08-08 NOTE — Telephone Encounter (Signed)
Returned call to wife scheduled appt today 10am with Suanne Marker they will CB if unable to make it at 10am will wear mask.

## 2018-08-08 NOTE — Progress Notes (Signed)
Cardiology Office Note   Date:  08/08/2018   ID:  CAEDMON LOUQUE, DOB November 07, 1945, MRN [id number]  PCP:  Biagio Borg, MD Cardiologist:  [name], MD 06/28/2017 Electrophysiologist: Cristopher Peru, MD 03/07/2018 Rosaria Ferries, PA-C   No chief complaint on file.   History of Present Illness: [name] is a 73 y.o. male with a history of CHB s/p MDT PPM, CABG w/ LIMA-LAD, SVG-CFX (occluded 2010), EF 60% 2019 echo, Aflutter w/ rate control (no ablation) on coumadin (not on DOAC due to $), MV 03/2014 w/ scar, no isch & EF 52%, D-CHF, 4 mm lung nodule on CT 2018 w/ PCP, f/u 1 yr, ARF 05/2017 w/ Cr 4.33>1.29 in 01/2018  07/08 phone notes about chest pain>>appt made  [name] presents for cardiology follow up.  He does not eat salt. Tries to watch the salt in what he eats. Does not add salt to food. He does the cooking. They do not eat out much.  He has significant DOE. He can walk only about 100 feet, without a mask. Got very SOB walking from the waiting room to the exam room. Took several minutes to get back to normal.   He wakes in the night due to SOB. Sleeps on 3 pillows, but has been like this for a long time. Wakes w/ LE edema, it gets worse during the day. Cannot remember how long he has been swelling. No acute changes.   He has episodes where he gets an electrical jolt across his chest, gets this 1-2 x day. No association w/ exertion. He will feel SOB for about 15 seconds and then go back to normal. Does not get palpitations w/ this.  No association with particular positions.  The jolt lasts only a second or less.  No hx exertional chest pain. Exertion is limited by SOB.  He does not do remote PPM checks because he cannot afford them.   He was using a pedal while watching TV, could do 45", but wife doesn't want him to ride that long.    Past Medical History:  Diagnosis Date  . Aortic root dilatation ([place]) 02/02/2011  . Arthritis    "lower back; going  back down both my sciatic nerves"  . Atrioventricular block, complete ([place]) 09/04/2008  . BENIGN PROSTATIC HYPERTROPHY 08/29/2006   takes Flomax daily  . 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 Cataracts, bilateral    immature  . CHF (congestive heart failure) (H[facility])    takes Lasix daily  . Chronic back pain    HNP   . CORONARY ARTERY DISEASE 08/29/2006   takes Coumadin daily  . DEPRESSION 08/29/2006  . DIABETES MELLITUS, TYPE II 08/29/2006   takes Metformin,Januvia,and Glipizide  daily  . DIASTOLIC HEART FAILURE, CHRONIC 06/09/2008  . GOUT 04/22/2007   takes Allopurinol daily  . History of migraine    33yr ago  . HYPERLIPIDEMIA 08/29/2006   takes Atorvastatin daily  . HYPERTENSION 08/29/2006   takes Lisinopril daily  . INSOMNIA-SLEEP DISORDER-UNSPEC 10/23/2007  . Left lumbar radiculopathy 05/30/2010  . LUMBAR RADICULOPATHY, RIGHT 06/10/2007  . Muscle spasm    takes Zanaflex daily  . [place] 12[place]27[place]10   "I've had several MIs"  . 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 PACEMAKER, PERMANENT 03/04/2008   pt denies this date  . Peripheral neuropathy    takes Gabapentin daily  . Presence of permanent cardiac pacemaker   . Shortness of breath dyspnea    "all  my life" with exertion  . Sleep apnea    "if I lay flat I quit breathing; HOB up I'm fine"    Past Surgical History:  Procedure Laterality Date  . COLONOSCOPY    . CORONARY ANGIOPLASTY WITH STENT PLACEMENT  12/27/10   "I've had a total of 9 cardiac stents put in"  . CORONARY ARTERY BYPASS GRAFT  1992   CABG X 2  . ESOPHAGOGASTRODUODENOSCOPY N/A 12/01/2013   Procedure: ESOPHAGOGASTRODUODENOSCOPY (EGD);  Surgeon: Lafayette Dragon, MD;  Location: [facility];  Service: Endoscopy;  Laterality: N/A;  . INSERT / REPLACE / REMOVE PACEMAKER  ~ 2004   initial pacemaker placement  . INSERT / REPLACE / REMOVE PACEMAKER  10/2009   generator change  . LUMBAR LAMINECTOMY/DECOMPRESSION MICRODISCECTOMY Right 03/23/2014    Procedure: LAMINECTOMY AND FORAMINOTOMY RIGHT LUMBAR THREE-FOUR,LUMBAR FOUR-FIVE, LUMBAR FIVE-SACRAL ONE;  Surgeon: Charlie Pitter, MD;  Location: [facility];  Service: Neurosurgery;  Laterality: Right;  right  . LUMBAR LAMINECTOMY/DECOMPRESSION MICRODISCECTOMY Left 12/01/2014   Procedure: Left Lumbar Three-Four, Lumbar Four-Five Laminectomy and Foraminotomy;  Surgeon: Earnie Larsson, MD;  Location: [facility];  Service: Neurosurgery;  Laterality: Left;  . s/p left arm surgury after work accident  1991   "2000# steel fell on it"  . s/p right hand surgury for foreign object  1970's   "piece of wood went in my hand; had to get that out"    Current Outpatient Medications  Medication Sig Dispense Refill  . albuterol (PROAIR HFA) 108 (90 Base) MCG/ACT inhaler Inhale 2 puffs into the lungs every 6 (six) hours as needed for wheezing or shortness of breath. 3 Inhaler 3  . allopurinol (ZYLOPRIM) 300 MG tablet Take 1 tablet (300 mg total) by mouth daily. 90 tablet 3  . atorvastatin (LIPITOR) 80 MG tablet Take 1 tablet (80 mg total) by mouth daily. 90 tablet 3  . benzonatate (TESSALON PERLES) 100 MG capsule 1-2 tab by mouth every 6 hrs as needed for cough 60 capsule 1  . Blood Glucose Monitoring Suppl (ONE TOUCH ULTRA 2) w/Device KIT Use as directed 1 each 3  . carvedilol (COREG) 25 MG tablet Take 1 tablet (25 mg total) by mouth 2 (two) times daily. 180 tablet 3  . Cholecalciferol (VITAMIN D) 1000 UNITS capsule Take 1,000 Units by mouth 2 (two) times daily. Reported on 02/15/2015    . dextromethorphan-guaiFENesin (MUCINEX DM) 30-600 MG 12hr tablet Take 1 tablet by mouth 2 (two) times daily. 30 tablet 0  . donepezil (ARICEPT) 5 MG tablet Take 1 tablet (5 mg total) by mouth daily. 90 tablet 3  . fluticasone (FLONASE) 50 MCG/ACT nasal spray Place 2 sprays into both nostrils daily. 48 g 5  . furosemide (LASIX) 40 MG tablet Take 2 tablets (80 mg total) by mouth 2 (two) times daily. 720 tablet 3  . gabapentin  (NEURONTIN) 300 MG capsule TAKE 2 CAPSULES BY MOUTH 3  TIMES DAILY 540 capsule 3  . glipiZIDE (GLUCOTROL XL) 10 MG 24 hr tablet Take 1 tablet (10 mg total) by mouth daily with breakfast. 90 tablet 3  . glucose blood (ONE TOUCH ULTRA TEST) test strip CHECK BLOOD SUGAR TWO TIMES DAILY 100 each 3  . HYDROcodone-acetaminophen (NORCO) 10-325 MG tablet Take 1 tablet by mouth daily as needed. 30 tablet 0  . lisinopril (PRINIVIL,ZESTRIL) 2.5 MG tablet Take 1 tablet (2.5 mg total) by mouth daily. 90 tablet 3  . metFORMIN (GLUCOPHAGE) 500 MG tablet 4 tab by mouth daily  360 tablet 3  . Multiple Vitamin (MULTIVITAMIN WITH MINERALS) TABS tablet Take 1 tablet by mouth every morning.    Glory Rosebush DELICA LANCETS 81K MISC USE AS DIRECTED THREE TIMES DAILY TO CHECK BLOOD SUGAR 300 each 0  . oxybutynin (DITROPAN XL) 10 MG 24 hr tablet Take 1 tablet (10 mg total) by mouth at bedtime. 90 tablet 3  . potassium chloride SA (K-DUR,KLOR-CON) 20 MEQ tablet Take 1 tablet (20 mEq total) by mouth every evening. 90 tablet 3  . sitaGLIPtin (JANUVIA) 100 MG tablet Take 1 tablet (100 mg total) by mouth daily. 90 tablet 3  . tamsulosin (FLOMAX) 0.4 MG CAPS capsule 1 tab by mouth twice per day 180 capsule 3  . tiZANidine (ZANAFLEX) 4 MG tablet TAKE 1 TABLET BY MOUTH   EVERY 6 HOURS AS NEEDED FOR MUSCLE SPASMS. 180 tablet 2  . traZODone (DESYREL) 50 MG tablet Take 0.5-1 tablets (25-50 mg total) by mouth at bedtime as needed for sleep. 90 tablet 1  . warfarin (COUMADIN) 5 MG tablet TAKE 1/2 TO 1 TABLET BY MOUTH DAILY AS DIRECTED BY COUMADIN CLINIC 90 tablet 1   No current facility-administered medications for this visit.     Allergies:   Crestor [rosuvastatin calcium] and Latex    Social History:  The patient  reports that he quit smoking about 45 years ago. His smoking use included cigarettes. He has a 27.00 pack-year smoking history. He has quit using smokeless tobacco. He reports that he does not drink alcohol or use drugs.    Family History:  The patient's family history includes Coronary artery disease (age of onset: 20) in an other family member; Diabetes in his mother, sister, and another family member; Heart disease in his sister and sister; Lung cancer in his sister.  He indicated that his mother is deceased. He indicated that his father is deceased. He indicated that two of his three sisters are deceased. He indicated that his maternal grandmother is deceased. He indicated that his maternal grandfather is deceased. He indicated that his paternal grandmother is deceased. He indicated that his paternal grandfather is deceased. He indicated that the status of his other is unknown.    ROS:  Please see the history of present illness. All other systems are reviewed and negative.    PHYSICAL EXAM: VS:  BP 122/62   Pulse 66   Ht '5\' 11"'  (1.803 m)   Wt 210 lb (95.3 kg)   BMI 29.29 kg/m  , BMI Body mass index is 29.29 kg/m. GEN: Well nourished, well developed, male in no acute distress HEENT: normal for age  Neck: no JVD, no carotid bruit, no masses Cardiac: RRR; no murmur, no rubs, or gallops Respiratory:  clear to auscultation bilaterally, normal work of breathing GI: soft, nontender, nondistended, + BS MS: no deformity or atrophy; no edema; distal pulses are 2+ in all 4 extremities  Skin: warm and dry, no rash Neuro:  Strength and sensation are intact Psych: euthymic mood, full affect   EKG:  EKG is ordered today. The ekg ordered today demonstrates atrial flutter, ventricular pacing, heart rate 66  ECHO: 03/22/2017 - Left ventricle: The cavity size was normal. Wall thickness was   increased in a pattern of mild LVH. Indeterminant diastolic   function (atrial flutter). Systolic function was normal. The   estimated ejection fraction was in the range of 55% to 60%.   Although no diagnostic regional wall motion abnormality was   identified, this possibility cannot be  completely excluded on the   basis of  this study. - Aortic valve: There was no stenosis. - Aorta: Mildly dilated aortic root. Aortic root dimension: 40 mm   (ED). - Mitral valve: There was trivial regurgitation. - Left atrium: The atrium was moderately dilated. - Right ventricle: The cavity size was mildly dilated. Pacer wire   or catheter noted in right ventricle. Systolic function was   mildly reduced. - Right atrium: The atrium was moderately dilated. - Tricuspid valve: Peak RV-RA gradient (S): 32 mm Hg. - Pulmonary arteries: PA peak pressure: 35 mm Hg (S). - Inferior vena cava: The vessel was normal in size. The   respirophasic diameter changes were in the normal range (>= 50%),   consistent with normal central venous pressure.  Impressions:  - The patient was in atrial flutter. Normal LV size with mild LV   hypertrophy. EF 55-60%. Mildly dilated RV with mildly decreased   systolic function. Biatrial enlargement.   CATH: 06/15/2008 RESULTS:  The left main coronary artery:  The left main coronary artery  is free of significant disease.   Left anterior descending artery:  Left anterior descending artery is  completely occluded at its origin.   The circumflex artery:  The circumflex  artery gave rise to a marginal  branch and posterolateral branch.  There was 50-70% narrowing in the mid  circumflex just after the marginal branch.  The lesion was ostial in  relation to the marginal branch.   The right coronary artery:  The right coronary artery had a 70% stenosis  which was fairly focal within the midportion of the overlapping stents  in the mid right coronary artery.  The rest of the artery was irregular,  but had no major obstruction.   The saphenous vein graft to the circumflex artery was completely  occluded at its origin.  This was an old occlusion.   The LIMA graft to LAD was patent and functioned well.  The 2 stents in  the mid and distal LAD were patent.  The LAD was small in caliber and   irregular, but there was no significant obstruction.   The left ventriculogram:  The left ventriculogram performed in the RAO  projection showed good wall motion with no areas of hypokinesis.  The  estimated ejection fraction was 60%.   HEMODYNAMIC DATA:  The right atrial pressure was 4 mean.  The pulmonary  artery pressure was 27/4 with a mean of 17.  Pulmonary wedge pressure  was 5 mean.  Left ventricular pressure was 114/8.  The aortic pressure  was 114/81 with a mean of 98.  Cardiac output/cardiac index was 5.0/2.2  L/min/m2.   CONCLUSION:  1. Coronary artery disease, status post remote bypass surgery.  2. Severe native vessel disease with total occlusion of the left      anterior descending artery, 50-70% stenosis in the mid circumflex      artery, and 70% stenosis within the stent in the mid right      coronary.  3. Occluded vein graft to the circumflex artery (old) and patent LIMA      graft to the LAD with patent stents in the mid and distal LAD.  4. Normal LV function with an estimated ejection fraction of 60%.  5. Normal left ventricular filling pressures.   RECOMMENDATIONS:  There is no clear source of ischemia.  The lesion  within the stent in the right coronary artery may be slightly worse than  before and possibly  could be flow limiting, although I think it probably  is not.  His troponins were negative, so I think that is unlikely that  any of these lesions account for his admission symptoms.  We will try to  get a D-dimer to screen for pulmonary embolism and will plan on  outpatient Myoview scan.  MYOVIEW: 07/12/2017  The left ventricular ejection fraction is mildly decreased (45-54%).  Nuclear stress EF: 49%.  Defect 1: There is a medium defect of moderate severity present in the apical septal and apex location.  This is a low risk study.   Low risk stress nuclear study with pacing induced apicoseptal fixed artifact, otherwise normal perfusion. Mildly  reduced left ventricular regional due to pacing induced dyssynchrony.  Recent Labs: 09/06/2017: TSH 1.85 01/16/2018: B Natriuretic Peptide 332.0 01/31/2018: ALT 25; BUN 23; Creatinine, Ser 1.29; Hemoglobin 9.9; Platelets 195.0; Potassium 4.3; Sodium 137  CBC    Component Value Date/Time   WBC 8.2 01/31/2018 1102   RBC 3.84 (L) 01/31/2018 1102   HGB 9.9 (L) 01/31/2018 1102   HCT 31.1 (L) 01/31/2018 1102   PLT 195.0 01/31/2018 1102   MCV 81.1 01/31/2018 1102   MCH 25.1 (L) 01/17/2018 0449   MCHC 31.9 01/31/2018 1102   RDW 18.6 (H) 01/31/2018 1102   LYMPHSABS 1.7 01/31/2018 1102   MONOABS 0.5 01/31/2018 1102   EOSABS 0.3 01/31/2018 1102   BASOSABS 0.1 01/31/2018 1102   CMP Latest Ref Rng & Units 01/31/2018 01/17/2018 01/16/2018  Glucose 70 - 99 mg/dL 87 243(H) 138(H)  BUN 6 - 23 mg/dL 23 26(H) 29(H)  Creatinine 0.40 - 1.50 mg/dL 1.29 0.84 1.40(H)  Sodium 135 - 145 mEq/L 137 138 138  Potassium 3.5 - 5.1 mEq/L 4.3 4.0 4.0  Chloride 96 - 112 mEq/L 97 104 102  CO2 19 - 32 mEq/L 33(H) 27 27  Calcium 8.4 - 10.5 mg/dL 8.8 8.6(L) 8.7(L)  Total Protein 6.0 - 8.3 g/dL 6.7 6.4(L) 7.0  Total Bilirubin 0.2 - 1.2 mg/dL 0.4 0.6 0.6  Alkaline Phos 39 - 117 U/L 85 56 63  AST 0 - 37 U/L '30 20 25  ' ALT 0 - 53 U/L '25 24 28     ' Lipid Panel Lab Results  Component Value Date   CHOL 81 01/31/2018   HDL 32.60 (L) 01/31/2018   LDLCALC 38 01/31/2018   TRIG 54.0 01/31/2018   CHOLHDL 2 01/31/2018      Wt Readings from Last 3 Encounters:  08/08/18 210 lb (95.3 kg)  06/11/18 209 lb (94.8 kg)  04/10/18 213 lb (96.6 kg)     Other studies Reviewed: Additional studies/ records that were reviewed today include: office notes, hospital records and testing.  ASSESSMENT AND PLAN:  1.  Acute on chronic diastolic CHF - Although his weight has not significantly changed, he has volume overload by exam - He has significant dyspnea on exertion, that limits his activity level. - Add metolazone 2.5 mg, take  this for 2 days with an extra potassium. -Track daily weights, make sure he is fluid compliant, he is sodium compliant by his description - He may need to take the metolazone scheduled once a week to keep the extra fluid off, but see how he does with these 2 doses first. -Check a BMET today and when he comes back for his Coumadin check next week  2.  Chest discomfort - I explained that his ECG is unchanged and his heart rate is normal - His device was interrogated  and showed no high ventricular rate episodes recently - I explained that I had no reason to think his chest pain was cardiac, it is possibly related to some sort of cramping or nerve impingement - Do not feel further cardiac testing is indicated at this time.  3.  CAD - He is not having any exertional chest pain. - If his volume status improves, but his dyspnea does not, consider stress testing but Myoview last year was without ischemia so will not pursue that at this time.  4.  Hypertension: -Blood pressure is under good control  5.  Complete heart block, s/p [employer] pacemaker - Pacemaker was interrogated today, normal function and battery life is 3.5 years -He is ventricular pacing greater than 80% of the time - He has had 2 high ventricular rate episodes, 1 of 4 seconds and 1 of 5 seconds, none since March  6.  Persistent atrial flutter - Plan is for rate control - He is not having any symptoms related to this -Continue anticoagulation   Current medicines are reviewed at length with the patient today.  The patient does not have concerns regarding medicines.  The following changes have been made: Add metolazone, take for 2 days and then as needed, take an extra potassium tablet with the metolazone  Labs/ tests ordered today include:   Orders Placed This Encounter  Procedures  . Basic metabolic panel  . Basic metabolic panel  . EKG 12-Lead     Disposition:   FU with [name], MD  Signed, Rosaria Ferries, PA-C  08/08/2018 1:30 PM    [place] Phone: [phone]; Fax: [phone]

## 2018-08-08 NOTE — Telephone Encounter (Signed)
Called, spoke with wife- okay per DPR, advised to stop potassium, and to have blood work Architectural technologist, lab work ordered. patient wife verbalized understanding. Med list updated.

## 2018-08-08 NOTE — Telephone Encounter (Signed)
DC KCL; bmet in AM [employer]

## 2018-08-08 NOTE — Telephone Encounter (Signed)
Arrange PA ov [name]

## 2018-08-08 NOTE — Telephone Encounter (Signed)
Received call from [facility]- critical lab.  Potassium 5.8. Thanks!

## 2018-08-09 DIAGNOSIS — E875 Hyperkalemia: Secondary | ICD-10-CM | POA: Diagnosis not present

## 2018-08-10 ENCOUNTER — Other Ambulatory Visit: Payer: Self-pay

## 2018-08-10 ENCOUNTER — Encounter (HOSPITAL_COMMUNITY): Payer: Self-pay | Admitting: Emergency Medicine

## 2018-08-10 ENCOUNTER — Emergency Department (HOSPITAL_COMMUNITY)
Admission: EM | Admit: 2018-08-10 | Discharge: 2018-08-10 | Disposition: A | Discharge status: Home / Self Care | Payer: Medicare Other | Attending: Emergency Medicine | Admitting: Emergency Medicine

## 2018-08-10 ENCOUNTER — Telehealth: Payer: Self-pay | Admitting: Internal Medicine

## 2018-08-10 DIAGNOSIS — E875 Hyperkalemia: Secondary | ICD-10-CM

## 2018-08-10 DIAGNOSIS — R799 Abnormal finding of blood chemistry, unspecified: Secondary | ICD-10-CM | POA: Insufficient documentation

## 2018-08-10 DIAGNOSIS — Z8639 Personal history of other endocrine, nutritional and metabolic disease: Secondary | ICD-10-CM | POA: Diagnosis not present

## 2018-08-10 DIAGNOSIS — Z950 Presence of cardiac pacemaker: Secondary | ICD-10-CM | POA: Insufficient documentation

## 2018-08-10 DIAGNOSIS — Z87891 Personal history of nicotine dependence: Secondary | ICD-10-CM | POA: Diagnosis not present

## 2018-08-10 DIAGNOSIS — I130 Hypertensive heart and chronic kidney disease with heart failure and stage 1 through stage 4 chronic kidney disease, or unspecified chronic kidney disease: Secondary | ICD-10-CM | POA: Diagnosis not present

## 2018-08-10 DIAGNOSIS — Z91040 Latex allergy status: Secondary | ICD-10-CM | POA: Diagnosis not present

## 2018-08-10 DIAGNOSIS — I5032 Chronic diastolic (congestive) heart failure: Secondary | ICD-10-CM | POA: Insufficient documentation

## 2018-08-10 DIAGNOSIS — I2510 Atherosclerotic heart disease of native coronary artery without angina pectoris: Secondary | ICD-10-CM | POA: Insufficient documentation

## 2018-08-10 DIAGNOSIS — N189 Chronic kidney disease, unspecified: Secondary | ICD-10-CM | POA: Diagnosis not present

## 2018-08-10 DIAGNOSIS — E1122 Type 2 diabetes mellitus with diabetic chronic kidney disease: Secondary | ICD-10-CM | POA: Diagnosis not present

## 2018-08-10 DIAGNOSIS — R600 Localized edema: Secondary | ICD-10-CM | POA: Insufficient documentation

## 2018-08-10 DIAGNOSIS — Z7901 Long term (current) use of anticoagulants: Secondary | ICD-10-CM | POA: Insufficient documentation

## 2018-08-10 DIAGNOSIS — Z951 Presence of aortocoronary bypass graft: Secondary | ICD-10-CM | POA: Insufficient documentation

## 2018-08-10 DIAGNOSIS — Z7984 Long term (current) use of oral hypoglycemic drugs: Secondary | ICD-10-CM | POA: Insufficient documentation

## 2018-08-10 LAB — CBC WITH DIFFERENTIAL/PLATELET
Abs Immature Granulocytes: 0.03 10*3/uL (ref 0.00–0.07)
Basophils Absolute: 0 10*3/uL (ref 0.0–0.1)
Basophils Relative: 0 %
Eosinophils Absolute: 0.2 10*3/uL (ref 0.0–0.5)
Eosinophils Relative: 4 %
HCT: 30.2 % — ABNORMAL LOW (ref 39.0–52.0)
Hemoglobin: 8.8 g/dL — ABNORMAL LOW (ref 13.0–17.0)
Immature Granulocytes: 1 %
Lymphocytes Relative: 21 %
Lymphs Abs: 1.1 10*3/uL (ref 0.7–4.0)
MCH: 26.1 pg (ref 26.0–34.0)
MCHC: 29.1 g/dL — ABNORMAL LOW (ref 30.0–36.0)
MCV: 89.6 fL (ref 80.0–100.0)
Monocytes Absolute: 0.4 10*3/uL (ref 0.1–1.0)
Monocytes Relative: 8 %
Neutro Abs: 3.5 10*3/uL (ref 1.7–7.7)
Neutrophils Relative %: 66 %
Platelets: 187 10*3/uL (ref 150–400)
RBC: 3.37 MIL/uL — ABNORMAL LOW (ref 4.22–5.81)
RDW: 17.2 % — ABNORMAL HIGH (ref 11.5–15.5)
WBC: 5.3 10*3/uL (ref 4.0–10.5)
nRBC: 0.4 % — ABNORMAL HIGH (ref 0.0–0.2)

## 2018-08-10 LAB — COMPREHENSIVE METABOLIC PANEL
ALT: 32 U/L (ref 0–44)
AST: 26 U/L (ref 15–41)
Albumin: 3.4 g/dL — ABNORMAL LOW (ref 3.5–5.0)
Alkaline Phosphatase: 80 U/L (ref 38–126)
Anion gap: 10 (ref 5–15)
BUN: 12 mg/dL (ref 8–23)
CO2: 22 mmol/L (ref 22–32)
Calcium: 8.4 mg/dL — ABNORMAL LOW (ref 8.9–10.3)
Chloride: 107 mmol/L (ref 98–111)
Creatinine, Ser: 0.95 mg/dL (ref 0.61–1.24)
GFR calc Af Amer: >60 mL/min (ref >60)
GFR calc non Af Amer: >60 mL/min (ref >60)
Glucose, Bld: 171 mg/dL — ABNORMAL HIGH (ref 70–99)
Potassium: 5 mmol/L (ref 3.5–5.1)
Sodium: 139 mmol/L (ref 135–145)
Total Bilirubin: 0.6 mg/dL (ref 0.3–1.2)
Total Protein: 5.9 g/dL — ABNORMAL LOW (ref 6.5–8.1)

## 2018-08-10 LAB — BASIC METABOLIC PANEL
BUN/Creatinine Ratio: 12 (ref 10–24)
BUN: 11 mg/dL (ref 8–27)
CO2: 21 mmol/L (ref 20–29)
Calcium: 8.6 mg/dL (ref 8.6–10.2)
Chloride: 108 mmol/L — ABNORMAL HIGH (ref 96–106)
Creatinine, Ser: 0.89 mg/dL (ref 0.76–1.27)
GFR calc Af Amer: 99 mL/min/{1.73_m2} (ref >59)
GFR calc non Af Amer: 85 mL/min/{1.73_m2} (ref >59)
Glucose: 136 mg/dL — ABNORMAL HIGH (ref 65–99)
Potassium: 6.3 mmol/L (ref 3.5–5.2)
Sodium: 143 mmol/L (ref 134–144)

## 2018-08-10 MED ORDER — SODIUM POLYSTYRENE SULFONATE PO POWD: Freq: Once | ORAL | 0 refills | Status: DC

## 2018-08-10 NOTE — ED Notes (Signed)
Patient verbalizes understanding of discharge instructions. Opportunity for questioning and answers were provided. Armband removed by staff, pt discharged from ED.  

## 2018-08-10 NOTE — Discharge Instructions (Addendum)
You have been seen today for an abnormal lab value. Your potassium was within normal levels today in the ER.  Please read and follow all provided instructions.   1. Medications: usual home medications 2. Treatment: rest, drink plenty of fluids 3. Follow Up: Please follow up with your primary doctor in 2 days for discussion of your diagnoses and further evaluation after today's visit; if you do not have a primary care doctor use the resource guide provided to find one; Please return to the ER for any new or worsening symptoms. Please obtain all of your results from medical records or have your doctors office obtain the results - share them with your doctor - you should be seen at your doctors office. Call today to arrange your follow up.   Take medications as prescribed. Please review all of the medicines and only take them if you do not have an allergy to them. Return to the emergency room for worsening condition or new concerning symptoms. Follow up with your regular doctor. If you don't have a regular doctor use one of the numbers below to establish a primary care doctor. ?  You should return to the ER if you develop severe or worsening symptoms.   Emergency Department Resource Guide 1) Find a Doctor and Pay Out of Pocket Although you won't have to find out who is covered by your insurance plan, it is a good idea to ask around and get recommendations. You will then need to call the office and see if the doctor you have chosen will accept you as a new patient and what types of options they offer for patients who are self-pay. Some doctors offer discounts or will set up payment plans for their patients who do not have insurance, but you will need to ask so you aren't surprised when you get to your appointment.  2) Contact Your Local Health Department Not all health departments have doctors that can see patients for sick visits, but many do, so it is worth a call to see if yours does. If you don't know  where your local health department is, you can check in your phone book. The CDC also has a tool to help you locate your state's health department, and many state websites also have listings of all of their local health departments.  3) Find a [facility] If your illness is not likely to be very severe or complicated, you may want to try a walk in clinic. These are popping up all over the country in pharmacies, drugstores, and shopping centers. They're usually staffed by nurse practitioners or physician assistants that have been trained to treat common illnesses and complaints. They're usually fairly quick and inexpensive. However, if you have serious medical issues or chronic medical problems, these are probably not your best option.  No Primary Care Doctor: Call Health Connect at  [phone] - they can help you locate a primary care doctor that  accepts your insurance, provides certain services, etc. Physician Referral Service- [phone]  Chronic Pain Problems: Organization         Address  Phone   Notes  [facility]  [phone] Patients need to be referred by their primary care doctor.   Medication Assistance: Organization         Address  Phone   Notes  [facility] Medication [facility] [address] [place], [place] [address] [phone] --Must be a resident of [facility] -- Must  have NO insurance coverage whatsoever (no Medicaid/ Medicare, etc.) -- The pt. MUST have a primary care doctor that directs their care regularly and follows them in the community   [facility]  [phone]   [employer]  [phone]    Agencies that provide inexpensive medical care: Organization         Address  Phone   Notes  [place]  [phone]   [facility]    [phone]   [facility] [place], [place] [address] [phone]   [facility] [address], [place] [phone]   [facility]    [phone]   [facility]    [phone]   [place] and [place] [address], [place] Phone:  [phone], Fax:  [phone] Hours of Operation:  9 am - 6 pm, M-F.  Also accepts Medicaid/Medicare and self-pay.  [facility] for [place] [address], [address], [place] Phone: [phone], Fax: [phone]. Hours of Operation:  8:30 am - 5:30 pm, M-F.  Also accepts Medicaid and self-pay.  [facility] [address], [place] Phone: [phone]   [place], [place], [place] [phone], Ext. [phone] Mondays & Thursdays: 7-9 AM.  First 15 patients are seen on a first come, first serve basis.    [place] Providers:  Organization         Address  Phone   Notes  [facility] [address], [place] [phone] Also accepts self-pay patients.  [facility] [address], [place]  [phone]   [place], [address], [place] [phone]   [facility] [address], [place] [phone]   Lucianne Lei [address], [place]   [phone] Only accepts [place] patients after they have their name applied to their card.   Self-Pay (no insurance) in [facility]:  Organization         Address  Phone   Notes  Sickle Cell Patients, [facility] [place] [phone]   [facility] [place] [phone]   [facility] [place]  [address]egin[address] Ramirez, [place], [place] [phone]   [facility]/Dr. Osei-Bonsu  [address], [place] or [address], [place] [phone] Phone number for both  [place] and [place] locations is the same.  Urgent [facility] and [facility] [address] [place] [phone]   [facility] [address], [place] or [address] [phone]   [facility] [address] [place] [phone], phone; [phone], fax Sees patients 1st and 3rd Saturday of every month.  Must not qualify for public or private insurance (i.e. Medicaid, Medicare, [place] Health Choice, Veterans' Benefits)  Household income should be no more than 200% of the poverty level The clinic cannot treat you if you are pregnant or think you are pregnant  Sexually transmitted diseases are not treated at the clinic.

## 2018-08-10 NOTE — ED Triage Notes (Signed)
Pt here due to PCP stating his potassium was "dangerously" elevated.  Denies any CP currently.  A/O x4.

## 2018-08-10 NOTE — Telephone Encounter (Signed)
Received a call from the lab regarding persistent hyperkalemia of 6.3. I spoke with [name] wife. They have stopped his potassium as instructed and he is feeling well. She is unsure if the lab is open today in [place]. I recommended that he go to an ER to have his potassium rechecked and get medication to help bring it down if needed. He would prefer to come to [facility] than stay locally. He will come down first thing this morning.

## 2018-08-10 NOTE — ED Provider Notes (Signed)
[facility] EMERGENCY DEPARTMENT Provider Note   CSN: [id number] Arrival date & time: 08/10/18  1914    History   Chief Complaint Chief Complaint  Patient presents with  . Abnormal Lab    HPI [name] is a 73 y.o. male with a PMH of CAD s/p CABG, complete heart block s/p pacemaker, atrial flutter with rate control on coumadin, CHF, T2DM, HTN, and MI presenting with an abnormal lab value. Patient reports his PCP called today and advised him to come to the ER due to hyperkalemia noted at 6.3. Patient states he stopped taking his potassium yesterday. Last dose of potassium was 2 days ago. Patient denies chest pain. Patient reports chronic shortness of breath, but states it is at baseline. Patient reports chronic right leg edema and states it is unchanged. Patient denies leg pain. Patient denies cramps, numbness, dizziness, lightheadedness, syncope, or weakness. Patient's cardiologist is Dr. Stanford Breed. Patient reports he has been compliant with his medications. Patient denies any symptoms currently.     HPI  Past Medical History:  Diagnosis Date  . Aortic root dilatation ([place]) 02/02/2011  . Arthritis    "lower back; going back down both my sciatic nerves"  . Atrioventricular block, complete ([place]) 09/04/2008  . BENIGN PROSTATIC HYPERTROPHY 08/29/2006   takes Flomax daily  . 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 Cataracts, bilateral    immature  . CHF (congestive heart failure) (HCC)    takes Lasix daily  . Chronic back pain    HNP   . CORONARY ARTERY DISEASE 08/29/2006   takes Coumadin daily  . DEPRESSION 08/29/2006  . DIABETES MELLITUS, TYPE II 08/29/2006   takes Metformin,Januvia,and Glipizide  daily  . DIASTOLIC HEART FAILURE, CHRONIC 06/09/2008  . GOUT 04/22/2007   takes Allopurinol daily  . History of migraine    63yr ago  . HYPERLIPIDEMIA 08/29/2006   takes Atorvastatin daily  . HYPERTENSION 08/29/2006   takes Lisinopril daily  . INSOMNIA-SLEEP  DISORDER-UNSPEC 10/23/2007  . Left lumbar radiculopathy 05/30/2010  . LUMBAR RADICULOPATHY, RIGHT 06/10/2007  . Muscle spasm    takes Zanaflex daily  . Myocardial infarction ([place] 12/27/10   "I've had several MIs"  . 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 PACEMAKER, PERMANENT 03/04/2008   pt denies this date  . Peripheral neuropathy    takes Gabapentin daily  . Presence of permanent cardiac pacemaker   . Shortness of breath dyspnea    "all my life" with exertion  . Sleep apnea    "if I lay flat I quit breathing; HOB up I'm fine"    Patient Active Problem List   Diagnosis Date Noted  . Nail problem 07/10/2018  . Right knee pain 03/13/2018  . Degenerative arthritis of right knee 03/13/2018  . Nondisplaced longitudinal fracture of right patella, initial encounter for closed fracture 03/13/2018  . Chondrocalcinosis 02/05/2018  . Acute respiratory failure with hypoxia ([place] 01/16/2018  . Elevated troponin 01/16/2018  . Weakness 12/21/2017  . Hypersomnolence 09/20/2017  . Acute renal failure superimposed on stage 2 chronic kidney disease ([place] 06/16/2017  . Left cervical radiculopathy 02/07/2017  . Trochanteric bursitis, right hip 01/01/2017  . Right hip pain 12/26/2016  . Knee instability, right 11/03/2016  . Urinary frequency 10/20/2016  . Right foot pain 09/29/2016  . Prepatellar bursitis 06/29/2016  . Left knee pain 06/28/2016  . Ingrown nail 06/28/2016  . Acute gouty arthritis 05/07/2016  . Left shoulder pain 02/09/2016  . Memory loss 01/19/2016  .  Ingrown toenail 09/13/2015  . Renal insufficiency 09/08/2015  . Right leg swelling 07/13/2015  . Leg wound, right 06/15/2015  . Rash 06/06/2015  . Anemia 05/24/2015  . Cellulitis of leg, right 05/13/2015  . Greater trochanteric bursitis of left hip 08/12/2014  . Left leg pain 07/15/2014  . 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 Lumbar stenosis with neurogenic claudication 03/23/2014   . Long term current use of anticoagulant therapy 02/27/2014  . Dysphagia, pharyngoesophageal phase 12/01/2013  . 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 Dysphagia 11/11/2013  . Lumbago 11/03/2013  . Low back pain radiating to right leg 11/03/2013  . Abnormality of gait 11/03/2013  . Difficulty walking 11/03/2013  . Loss of weight 10/29/2013  . Atrial fibrillation ([place]) 08/26/2013  . Chest pain 04/05/2013  . Plantar fasciitis of right foot 02/18/2013  . Bruit 08/21/2012  . Sciatica of left side 08/15/2012  . Left hip pain 08/15/2012  . Headache(784.0) 05/03/2012  . Increased prostate specific antigen (PSA) velocity 11/11/2011  . Right sided sciatica 11/10/2011  . Chronic low back pain 05/03/2011  . Aortic root dilatation ([place]) 02/02/2011  . Wheezing without diagnosis of asthma 02/02/2011  . Syncope 12/27/2010  . Left lumbar radiculopathy 05/30/2010  . Wellness examination 05/30/2010  . Atrioventricular block, complete ([place]) 09/04/2008  . DOE (dyspnea on exertion) 06/10/2008  . DIASTOLIC HEART FAILURE, CHRONIC 06/09/2008  . KNEE PAIN, BILATERAL 04/21/2008  . LEG PAIN, BILATERAL 04/21/2008  . 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 PACEMAKER, PERMANENT 03/04/2008  . INSOMNIA-SLEEP DISORDER-UNSPEC 10/23/2007  . Lumbar radiculopathy, chronic 06/10/2007  . GOUT 04/22/2007  . 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 ERECTILE DYSFUNCTION 11/26/2006  . ALLERGIC RHINITIS 11/26/2006  . Type 2 diabetes, uncontrolled, with neuropathy ([place]) 08/29/2006  . Hyperlipidemia 08/29/2006  . Overweight(278.02) 08/29/2006  . Depression 08/29/2006  . Essential hypertension 08/29/2006  . CORONARY ARTERY DISEASE 08/29/2006  . BENIGN PROSTATIC HYPERTROPHY 08/29/2006    Past Surgical History:  Procedure Laterality Date  . COLONOSCOPY    . CORONARY ANGIOPLASTY WITH STENT PLACEMENT  12/27/10   "I've had a total of 9 cardiac stents put in"  . CORONARY ARTERY BYPASS GRAFT  1992    CABG X 2  . ESOPHAGOGASTRODUODENOSCOPY N/A 12/01/2013   Procedure: ESOPHAGOGASTRODUODENOSCOPY (EGD);  Surgeon: Lafayette Dragon, MD;  [facility];  Service: Endoscopy;  Laterality: N/A;  . INSERT / REPLACE / REMOVE PACEMAKER  ~ 2004   initial pacemaker placement  . INSERT / REPLACE / REMOVE PACEMAKER  10/2009   generator change  . LUMBAR LAMINECTOMY/DECOMPRESSION MICRODISCECTOMY Right 03/23/2014   Procedure: LAMINECTOMY AND FORAMINOTOMY RIGHT LUMBAR THREE-FOUR,LUMBAR FOUR-FIVE, LUMBAR FIVE-SACRAL ONE;  Surgeon: Charlie Pitter, MD;  Location: [facility];  Service: Neurosurgery;  Laterality: Right;  right  . LUMBAR LAMINECTOMY/DECOMPRESSION MICRODISCECTOMY Left 12/01/2014   Procedure: Left Lumbar Three-Four, Lumbar Four-Five Laminectomy and Foraminotomy;  Surgeon: Earnie Larsson, MD;  Location: [facility];  Service: Neurosurgery;  Laterality: Left;  . s/p left arm surgury after work accident  1991   "2000# steel fell on it"  . s/p right hand surgury for foreign object  1970's   "piece of wood went in my hand; had to get that out"        Home Medications    Prior to Admission medications   Medication Sig Start Date End Date Taking? Authorizing Provider  albuterol (PROAIR HFA) 108 (90 Base) MCG/ACT inhaler Inhale 2 puffs into the lungs every 6 (  six) hours as needed for wheezing or shortness of breath. 02/05/18   Biagio Borg, MD  allopurinol (ZYLOPRIM) 300 MG tablet Take 1 tablet (300 mg total) by mouth daily. 01/31/18   Biagio Borg, MD  atorvastatin (LIPITOR) 80 MG tablet Take 1 tablet (80 mg total) by mouth daily. 01/31/18   Biagio Borg, MD  benzonatate (TESSALON PERLES) 100 MG capsule 1-2 tab by mouth every 6 hrs as needed for cough 01/31/18   Biagio Borg, MD  Blood Glucose Monitoring Suppl (ONE TOUCH ULTRA 2) w/Device KIT Use as directed 10/12/15   Biagio Borg, MD  carvedilol (COREG) 25 MG tablet Take 1 tablet (25 mg total) by mouth 2 (two) times daily. 01/31/18   Biagio Borg, MD   Cholecalciferol (VITAMIN D) 1000 UNITS capsule Take 1,000 Units by mouth 2 (two) times daily. Reported on 02/15/2015 07/31/13   [provider]  dextromethorphan-guaiFENesin (MUCINEX DM) 30-600 MG 12hr tablet Take 1 tablet by mouth 2 (two) times daily. 01/17/18   Kathie Dike, MD  donepezil (ARICEPT) 5 MG tablet Take 1 tablet (5 mg total) by mouth daily. 01/31/18   Biagio Borg, MD  fluticasone (FLONASE) 50 MCG/ACT nasal spray Place 2 sprays into both nostrils daily. 07/19/18   Biagio Borg, MD  furosemide (LASIX) 40 MG tablet Take 2 tablets (80 mg total) by mouth 2 (two) times daily. 01/31/18   Biagio Borg, MD  gabapentin (NEURONTIN) 300 MG capsule TAKE 2 CAPSULES BY MOUTH 3  TIMES DAILY 01/31/18   Biagio Borg, MD  glipiZIDE (GLUCOTROL XL) 10 MG 24 hr tablet Take 1 tablet (10 mg total) by mouth daily with breakfast. 01/31/18   Biagio Borg, MD  glucose blood (ONE TOUCH ULTRA TEST) test strip CHECK BLOOD SUGAR TWO TIMES DAILY 05/24/17   Biagio Borg, MD  HYDROcodone-acetaminophen (NORCO) 10-325 MG tablet Take 1 tablet by mouth daily as needed. 07/23/18   Biagio Borg, MD  lisinopril (PRINIVIL,ZESTRIL) 2.5 MG tablet Take 1 tablet (2.5 mg total) by mouth daily. 01/31/18   Biagio Borg, MD  metFORMIN (GLUCOPHAGE) 500 MG tablet 4 tab by mouth daily 01/31/18   Biagio Borg, MD  metolazone (ZAROXOLYN) 2.5 MG tablet Take 1 tablet (2.5 mg total) by mouth daily. For 2 days with one extra potassium tablet both days. 08/08/18 11/06/18  Barrett, Evelene Croon, PA-C  Multiple Vitamin (MULTIVITAMIN WITH MINERALS) TABS tablet Take 1 tablet by mouth every morning.    [provider]  [name] DELICA LANCETS 00X MISC USE AS DIRECTED THREE TIMES DAILY TO CHECK BLOOD SUGAR 05/24/17   Biagio Borg, MD  oxybutynin (DITROPAN XL) 10 MG 24 hr tablet Take 1 tablet (10 mg total) by mouth at bedtime. 01/31/18   Biagio Borg, MD  sitaGLIPtin (JANUVIA) 100 MG tablet Take 1 tablet (100 mg total) by mouth daily. 01/31/18    Biagio Borg, MD  tamsulosin [facility]) 0.4 MG CAPS capsule 1 tab by mouth twice per day 01/31/18   Biagio Borg, MD  tiZANidine (ZANAFLEX) 4 MG tablet TAKE 1 TABLET BY MOUTH   EVERY 6 HOURS AS NEEDED FOR MUSCLE SPASMS. 09/20/17   Biagio Borg, MD  traZODone (DESYREL) 50 MG tablet Take 0.5-1 tablets (25-50 mg total) by mouth at bedtime as needed for sleep. 07/09/18   Biagio Borg, MD  warfarin (COUMADIN) 5 MG tablet TAKE 1/2 TO 1 TABLET BY MOUTH DAILY AS DIRECTED BY COUMADIN  CLINIC 06/20/18   Evans Lance, MD    Family History Family History  Problem Relation Age of Onset  . Diabetes Mother   . Diabetes Sister   . Heart disease Sister        2 sister died with heart disease  . Coronary artery disease Other 25       male, first degree relative  . Diabetes Other        1st degree relative  . Heart disease Sister   . Lung cancer Sister        deceased    Social History Social History   Tobacco Use  . Smoking status: Former Smoker    Packs/day: 3.00    Years: 9.00    Pack years: 27.00    Types: Cigarettes    Quit date: 02/26/1973    Years since quitting: 45.4  . Smokeless tobacco: Former Systems developer  . Tobacco comment: quit smoking 20yr ago  Substance Use Topics  . Alcohol use: No    Alcohol/week: 0.0 standard drinks  . Drug use: No    Comment: "used pouches of tobacco for a long time; quit those 12/09/1970"     Allergies   Crestor [rosuvastatin calcium] and Latex   Review of Systems Review of Systems  Constitutional: Negative for chills, diaphoresis and fever.  Eyes: Negative for visual disturbance.  Respiratory: Negative for cough and shortness of breath.   Cardiovascular: Negative for chest pain, palpitations and leg swelling.  Gastrointestinal: Negative for abdominal pain, nausea and vomiting.  Endocrine: Negative for cold intolerance and heat intolerance.  Genitourinary: Negative for dysuria.  Musculoskeletal: Negative for back pain.  Skin: Negative for rash.   Allergic/Immunologic: Negative for immunocompromised state.  Neurological: Negative for dizziness, syncope, weakness, light-headedness and numbness.  Hematological: Negative for adenopathy. Bruises/bleeds easily.     Physical Exam Updated Vital Signs BP (!) 146/78   Pulse 60   Temp 98.3 F (36.8 C) (Oral)   Resp 19   Ht _0  (1.803 m)   Wt 95.3 kg   SpO2 96%   BMI 29.29 kg/m   Physical Exam Vitals signs and nursing note reviewed.  Constitutional:      General: He is not in acute distress.    Appearance: He is well-developed. He is not diaphoretic.     Comments: Patient is sitting up in bed in no acute distress.  HENT:     Head: Normocephalic and atraumatic.     Mouth/Throat:     Mouth: Mucous membranes are moist.     Pharynx: No posterior oropharyngeal erythema.  Eyes:     Conjunctiva/sclera: Conjunctivae normal.  Neck:     Musculoskeletal: Normal range of motion and neck supple.     Vascular: No JVD.  Cardiovascular:     Rate and Rhythm: Normal rate and regular rhythm.     Pulses: Normal pulses.          Radial pulses are 2+ on the right side and 2+ on the left side.       Dorsalis pedis pulses are 2+ on the right side and 2+ on the left side.     Heart sounds: Normal heart sounds. No murmur. No friction rub. No gallop.   Pulmonary:     Effort: Pulmonary effort is normal. No respiratory distress.     Breath sounds: Normal breath sounds. No wheezing, rhonchi or rales.     Comments: Patient is speaking in full sentences without difficulty on room air.  Chest:     Chest wall: No tenderness.  Abdominal:     Palpations: Abdomen is soft.     Tenderness: There is no abdominal tenderness.  Musculoskeletal: Normal range of motion.        General: No tenderness, deformity or signs of injury.     Right lower leg: Edema present.     Left lower leg: Edema present.  Skin:    General: Skin is warm.     Capillary Refill: Capillary refill takes less than 2 seconds.      Coloration: Skin is not pale.     Findings: No rash.  Neurological:     Mental Status: He is alert.     ED Treatments / Results  Labs (all labs ordered are listed, but only abnormal results are displayed) Labs Reviewed  COMPREHENSIVE METABOLIC PANEL - Abnormal; Notable for the following components:      Result Value   Glucose, Bld 171 (*)    Calcium 8.4 (*)    Total Protein 5.9 (*)    Albumin 3.4 (*)    All other components within normal limits  CBC WITH DIFFERENTIAL/PLATELET - Abnormal; Notable for the following components:   RBC 3.37 (*)    Hemoglobin 8.8 (*)    HCT 30.2 (*)    MCHC 29.1 (*)    RDW 17.2 (*)    nRBC 0.4 (*)    All other components within normal limits   Hemoglobin  Date Value Ref Range Status  08/10/2018 8.8 (L) 13.0 - 17.0 g/dL Final  01/31/2018 9.9 (L) 13.0 - 17.0 g/dL Final  01/17/2018 8.4 (L) 13.0 - 17.0 g/dL Final  01/16/2018 9.2 (L) 13.0 - 17.0 g/dL Final    EKG EKG Interpretation  Date/Time:  Saturday August 10 2018 08:16:02 EDT Ventricular Rate:  62 PR Interval:    QRS Duration: 176 QT Interval:  458 QTC Calculation: 466 R Axis:   -86 Text Interpretation:  Atrial flutter Left bundle branch block Confirmed by Virgel Manifold [phone]) on 08/10/2018 8:34:56 AM   Radiology No results found.  Procedures Procedures (including critical care time)  Medications Ordered in ED Medications - No data to display   Initial Impression / Assessment and Plan / ED Course  I have reviewed the triage vital signs and the nursing notes.  Pertinent labs & imaging results that were available during my care of the patient were reviewed by me and considered in my medical decision making (see chart for details).  Clinical Course as of Aug 09 998  Sat Aug 10, 2018  0929 Potassium is within normal limits.   Potassium: 5.0 [AH]  0929 Low hemoglobin noted at 8.8. This appears similar to previous values.   Hemoglobin(!): 8.8 [AH]    Clinical Course User  Index [AH] Arville Lime, PA-C      Patient presents due to an abnormal lab value. Patient was informed his potassium was elevated and advised to come to the ER. Patient has been asymptomatic and in no acute distress. CMP reveals potassium is noted at 5. Potassium is within normal limits and does not require intervention at this time. Discussed return precautions with patient and advised patient to follow up with PCP. Patient states he understands and agrees with plan.   Findings and plan of care discussed with supervising physician Dr. Wilson Singer.  Final Clinical Impressions(s) / ED Diagnoses   Final diagnoses:  History of hyperkalemia    ED Discharge Orders    None  Darlin Drop Isabella, [place] 08/10/18 1000    Virgel Manifold, MD 08/11/18 [phone]

## 2018-08-10 NOTE — ED Notes (Signed)
Got patient undress on the monitor did ekg shown to Dr Kohut patient is resting with call bell in reach 

## 2018-08-12 ENCOUNTER — Telehealth: Payer: Self-pay | Admitting: Cardiology

## 2018-08-12 NOTE — Telephone Encounter (Signed)
-----   Message from [name], MD sent at 08/12/2018  7:08 AM EDT ----- FU BMET Thursday 7/16 (K decreased to 5 yesterday); stay off [name]

## 2018-08-12 NOTE — Telephone Encounter (Signed)
Spoke with wife [name] ([place]). Notified her of results. Patient will have BMET done in our office on 7/17 when here for INR check (this is already noted on INR check appt notes)

## 2018-08-13 ENCOUNTER — Telehealth: Payer: Self-pay | Admitting: General Surgery

## 2018-08-13 ENCOUNTER — Other Ambulatory Visit: Payer: Self-pay | Admitting: Cardiology

## 2018-08-13 ENCOUNTER — Telehealth: Payer: Self-pay

## 2018-08-13 NOTE — Telephone Encounter (Signed)

## 2018-08-13 NOTE — Telephone Encounter (Signed)
Covid-19 screening questions   Do you now or have you had a fever in the last 14 days? NO  Do you have any respiratory symptoms of shortness of breath or cough now or in the last 14 days? NO  Do you have any family members or close contacts with diagnosed or suspected Covid-19 in the past 14 days?NO  Have you been tested for Covid-19 and found to be positive? NO  Patient and healthcare partner were told to mask. Patient verbalized understanding.

## 2018-08-14 ENCOUNTER — Other Ambulatory Visit: Payer: Self-pay

## 2018-08-14 ENCOUNTER — Ambulatory Visit (INDEPENDENT_AMBULATORY_CARE_PROVIDER_SITE_OTHER): Payer: Medicare Other | Admitting: Gastroenterology

## 2018-08-14 ENCOUNTER — Encounter: Payer: Self-pay | Admitting: Gastroenterology

## 2018-08-14 VITALS — BP 100/54 | HR 68 | Temp 98.0°F | Ht 66.75 in | Wt 202.1 lb

## 2018-08-14 DIAGNOSIS — K224 Dyskinesia of esophagus: Secondary | ICD-10-CM

## 2018-08-14 DIAGNOSIS — K219 Gastro-esophageal reflux disease without esophagitis: Secondary | ICD-10-CM

## 2018-08-14 MED ORDER — PANTOPRAZOLE SODIUM 40 MG PO TBEC: 40.0000 mg | DELAYED_RELEASE_TABLET | Freq: Every day | ORAL | 2 refills | Status: DC

## 2018-08-14 NOTE — Progress Notes (Signed)
08/14/2018 [name] [id number] 01-05-1946   HISTORY OF PRESENT ILLNESS:  This is a pleasant 73 year old male who is a patient of Dr. Celesta Aver.  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  Symptoms date back to at least 2015 and he has had this evaluated on a few occasions.  Last work-up was in March 2018 and he continued to follow with speech pathology for some time, but they released him and said that there was nothing more they could do that he would have to live with it.  Returns today with the same complaints.  Says that when he drinks and eats that everything is wanting to come back up.  Has severe regurgitation, everything comes back up spontaneously.  Weight is down 17 pounds since March 2018.   Past Medical History:  Diagnosis Date  . Aortic root dilatation ([place]) 02/02/2011  . Arthritis    "lower back; going back down both my sciatic nerves"  . 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 BENIGN PROSTATIC HYPERTROPHY 08/29/2006   takes Flomax daily  . 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 Cataracts, bilateral    immature  . CHF (congestive heart failure) ([facility])    takes Lasix daily  . Chronic back pain    HNP   . CORONARY ARTERY DISEASE 08/29/2006   takes Coumadin daily  . DEPRESSION 08/29/2006  . DIABETES MELLITUS, TYPE II 08/29/2006   takes Metformin,Januvia,and Glipizide  daily  . DIASTOLIC HEART FAILURE, CHRONIC 06/09/2008  . GOUT 04/22/2007   takes Allopurinol daily  . History of migraine    47yr ago  . HYPERLIPIDEMIA 08/29/2006   takes Atorvastatin daily  . HYPERTENSION 08/29/2006   takes Lisinopril daily  . INSOMNIA-SLEEP DISORDER-UNSPEC 10/23/2007  . Left lumbar radiculopathy 05/30/2010  . LUMBAR RADICULOPATHY, RIGHT 06/10/2007  . Muscle spasm    takes Zanaflex daily  . Myocardial infarction ([place] 12/27/10   "I've had several MIs"  . 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 PACEMAKER, PERMANENT 03/04/2008    pt denies this date  . Peripheral neuropathy    takes Gabapentin daily  . Presence of permanent cardiac pacemaker   . Shortness of breath dyspnea    "all my life" with exertion  . Sleep apnea    "if I lay flat I quit breathing; HOB up I'm fine"   Past Surgical History:  Procedure Laterality Date  . COLONOSCOPY    . CORONARY ANGIOPLASTY WITH STENT PLACEMENT  12/27/10   "I've had a total of 9 cardiac stents put in"  . CORONARY ARTERY BYPASS GRAFT  1992   CABG X 2  . ESOPHAGOGASTRODUODENOSCOPY N/A 12/01/2013   Procedure: ESOPHAGOGASTRODUODENOSCOPY (EGD);  Surgeon: DLafayette Dragon MD;  [facility];  Service: Endoscopy;  Laterality: N/A;  . INSERT / REPLACE / REMOVE PACEMAKER  ~ 2004   initial pacemaker placement  . INSERT / REPLACE / REMOVE PACEMAKER  10/2009   generator change  . LUMBAR LAMINECTOMY/DECOMPRESSION MICRODISCECTOMY Right 03/23/2014   Procedure: LAMINECTOMY AND FORAMINOTOMY RIGHT LUMBAR THREE-FOUR,LUMBAR FOUR-FIVE, LUMBAR FIVE-SACRAL ONE;  Surgeon: HCharlie Pitter MD;  [facility];  Service: Neurosurgery;  Laterality: Right;  right  . LUMBAR LAMINECTOMY/DECOMPRESSION MICRODISCECTOMY Left 12/01/2014   Procedure: Left Lumbar Three-Four, Lumbar Four-Five Laminectomy and Foraminotomy;  Surgeon: HEarnie Larsson MD;  [facility];  Service: Neurosurgery;  Laterality: Left;  . s/p left arm surgury after work accident  1991   "2000#  steel fell on it"  . s/p right hand surgury for foreign object  1970's   "piece of wood went in my hand; had to get that out"    reports that he quit smoking about 45 years ago. His smoking use included cigarettes. He has a 27.00 pack-year smoking history. He has quit using smokeless tobacco. He reports that he does not drink alcohol or use drugs. family history includes Coronary artery disease (age of onset: 55) in an other family member; Diabetes in his mother, sister, and another family member; Heart disease in his sister and  sister; Lung cancer in his sister. Allergies  Allergen Reactions  . Crestor [Rosuvastatin Calcium] Other (See Comments)    Urination of blood  . Latex Rash and Other (See Comments)    Gloves cause welts on hands!!      Outpatient Encounter Medications as of 08/14/2018  Medication Sig  . albuterol (PROAIR HFA) 108 (90 Base) MCG/ACT inhaler Inhale 2 puffs into the lungs every 6 (six) hours as needed for wheezing or shortness of breath.  . allopurinol (ZYLOPRIM) 300 MG tablet Take 1 tablet (300 mg total) by mouth daily.  Marland Kitchen atorvastatin (LIPITOR) 80 MG tablet Take 1 tablet (80 mg total) by mouth daily.  . benzonatate (TESSALON PERLES) 100 MG capsule 1-2 tab by mouth every 6 hrs as needed for cough  . Blood Glucose Monitoring Suppl (ONE TOUCH ULTRA 2) w/Device KIT Use as directed  . carvedilol (COREG) 25 MG tablet Take 1 tablet (25 mg total) by mouth 2 (two) times daily.  . Cholecalciferol (VITAMIN D) 1000 UNITS capsule Take 1,000 Units by mouth 2 (two) times daily. Reported on 02/15/2015  . dextromethorphan-guaiFENesin (MUCINEX DM) 30-600 MG 12hr tablet Take 1 tablet by mouth 2 (two) times daily.  Marland Kitchen donepezil (ARICEPT) 5 MG tablet Take 1 tablet (5 mg total) by mouth daily.  . fluticasone (FLONASE) 50 MCG/ACT nasal spray Place 2 sprays into both nostrils daily.  . furosemide (LASIX) 40 MG tablet Take 2 tablets (80 mg total) by mouth 2 (two) times daily.  Marland Kitchen gabapentin (NEURONTIN) 300 MG capsule TAKE 2 CAPSULES BY MOUTH 3  TIMES DAILY  . glipiZIDE (GLUCOTROL XL) 10 MG 24 hr tablet Take 1 tablet (10 mg total) by mouth daily with breakfast.  . glucose blood (ONE TOUCH ULTRA TEST) test strip CHECK BLOOD SUGAR TWO TIMES DAILY  . HYDROcodone-acetaminophen (NORCO) 10-325 MG tablet Take 1 tablet by mouth daily as needed.  Marland Kitchen lisinopril (PRINIVIL,ZESTRIL) 2.5 MG tablet Take 1 tablet (2.5 mg total) by mouth daily.  . metFORMIN (GLUCOPHAGE) 500 MG tablet 4 tab by mouth daily  . metolazone (ZAROXOLYN) 2.5 MG  tablet Take 1 tablet (2.5 mg total) by mouth daily. For 2 days with one extra potassium tablet both days.  . Multiple Vitamin (MULTIVITAMIN WITH MINERALS) TABS tablet Take 1 tablet by mouth every morning.  Glory Rosebush DELICA LANCETS 27P MISC USE AS DIRECTED THREE TIMES DAILY TO CHECK BLOOD SUGAR  . oxybutynin (DITROPAN XL) 10 MG 24 hr tablet Take 1 tablet (10 mg total) by mouth at bedtime.  . sitaGLIPtin (JANUVIA) 100 MG tablet Take 1 tablet (100 mg total) by mouth daily.  . tamsulosin (FLOMAX) 0.4 MG CAPS capsule 1 tab by mouth twice per day  . tiZANidine (ZANAFLEX) 4 MG tablet TAKE 1 TABLET BY MOUTH   EVERY 6 HOURS AS NEEDED FOR MUSCLE SPASMS.  Marland Kitchen traZODone (DESYREL) 50 MG tablet Take 0.5-1 tablets (25-50 mg total) by mouth  at bedtime as needed for sleep.  Marland Kitchen warfarin (COUMADIN) 5 MG tablet TAKE 1/2 TO 1 TABLET BY MOUTH DAILY AS DIRECTED BY COUMADIN CLINIC   No facility-administered encounter medications on file as of 08/14/2018.      REVIEW OF SYSTEMS  : All other systems reviewed and negative except where noted in the History of Present Illness.   PHYSICAL EXAM: BP (!) 100/54 (BP Location: Left Arm, Patient Position: Sitting, Cuff Size: Normal)   Pulse 68   Temp 98 F (36.7 C)   Ht 5' 6.75" (1.695 m) Comment: height measured without shoes  Wt 202 lb 2 oz (91.7 kg)   BMI 31.89 kg/m  General: Well developed white male in no acute distress Head: Normocephalic and atraumatic Eyes:  Sclerae anicteric, conjunctiva pink. Ears: Normal auditory acuity Lungs: Clear throughout to auscultation; no increased WOB. Heart: Regular rate and rhythm; no M/R/G. Abdomen: Soft, non-distended.  BS present.  Non-tender. Musculoskeletal: Symmetrical with no gross deformities  Skin: No lesions on visible extremities Extremities: No edema  Neurological: Alert oriented x 4, grossly non-focal Psychological:  Alert and cooperative. Normal mood and affect  ASSESSMENT AND PLAN: *Severe dysphasia and GERD due  to significant impaired esophageal motility.  Symptoms date back to at least 2015 and he has had this evaluated on a few occasions.  Last work-up was in March 2018 and he continued to follow with speech pathology for some time, but they released him and said that there was nothing more they could do that he would have to live with it.  Returns today with the same complaints.  Unfortunately I do not think that there is much else for [place] to offer from a GI standpoint.  I am going to place him on once daily PPI therapy to see if that offers any relief, but I told him I am not promising miracles.  He will follow-up in 4 to 6 weeks.   CC:  Biagio Borg, MD

## 2018-08-14 NOTE — Patient Outreach (Signed)
[facility]) Care Management  08/14/2018  [name] March 19, 1945 [id number]   Referral Date: 08/14/2018 Referral Source: [facility] referral Referral Reason: Patient needs transportation and in home care assistance, Patient also has trouble affording medications at times.   Outreach Attempt: telephone call to patient. Male answered the phone stating patient not in.  Advised CM would call another time.  She verbalized understanding.     Plan: RN CM will attempt again within 4 business days and send letter.  Jone Baseman, RN, MSN [facility] Care Management Care Management Coordinator Direct Line [phone] Toll Free: [phone]  Fax: [phone]

## 2018-08-14 NOTE — Patient Instructions (Addendum)
If you are age 72 or older, your body mass index should be between 23-30. Your Body mass index is 31.89 kg/m. If this is out of the aforementioned range listed, please consider follow up with your Primary Care Provider.  If you are age 32 or younger, your body mass index should be between 19-25. Your Body mass index is 31.89 kg/m. If this is out of the aformentioned range listed, please consider follow up with your Primary Care Provider.   We have sent the following medications to your pharmacy for you to pick up at your convenience: Pantoprazole  Follow up with me in 4-6 weeks.  Please call the office for an appointment as the schedule is not available at this time.  Thank you for choosing me and [facility].   Alonza Bogus, PA-C

## 2018-08-15 ENCOUNTER — Encounter: Payer: Self-pay | Admitting: Gastroenterology

## 2018-08-15 ENCOUNTER — Other Ambulatory Visit: Payer: Self-pay

## 2018-08-15 DIAGNOSIS — K224 Dyskinesia of esophagus: Secondary | ICD-10-CM | POA: Insufficient documentation

## 2018-08-15 DIAGNOSIS — K219 Gastro-esophageal reflux disease without esophagitis: Secondary | ICD-10-CM | POA: Insufficient documentation

## 2018-08-15 NOTE — Patient Outreach (Signed)
[facility]) Care Management  08/15/2018  SAMRAT HAYWARD 1945-12-03 [id number]  Referral Date: 08/14/2018 Referral Source: [facility] referral Referral Reason: Patient needs transportation and in home care assistance, Patient also has trouble affording medications at times.   Outreach Attempt:  Spoke with patient.  He is able to verify HIPAA.  Discussed with patient reason for referral.  He states that he has problems with transportation to [facility] because he gets tired but her does have a friend that goes with him as he states he absolutely cannot afford transportation services and his [facility] benefit does not cover him coming that far.  Offered patient to have social work to explore transportation options with him but he declined. Discussed with patient medications.  Patient states that he is able to afford his medications but if the doctor orders him something right away he cannot get it right away if it is not the first of the month.  Offered patient to have pharmacist at least look at and help with his medications but he declined.    Patient admits to DM, CHF, A. Fib., HTN, and GERD. Patient states that his doctor wants him to get his A1c down.  His last A1c noted to be 7.3 and he states that his sugars run anywhere from 140-180.  Discussed support from [facility] for disease management.  Patient willing to give it a shot but states that he knows about all his health conditions as he has dealt with them so long.    Patient has completed advanced directives.    Plan: RN CM will refer patient to health coach for disease management.    Jone Baseman, RN, MSN [place] Management Care Management Coordinator Direct Line [phone] Cell [phone] Toll Free: [phone]  Fax: [phone]

## 2018-08-16 ENCOUNTER — Ambulatory Visit (INDEPENDENT_AMBULATORY_CARE_PROVIDER_SITE_OTHER): Payer: Medicare Other | Admitting: *Deleted

## 2018-08-16 ENCOUNTER — Other Ambulatory Visit: Payer: Self-pay

## 2018-08-16 DIAGNOSIS — I5032 Chronic diastolic (congestive) heart failure: Secondary | ICD-10-CM | POA: Diagnosis not present

## 2018-08-16 DIAGNOSIS — I4891 Unspecified atrial fibrillation: Secondary | ICD-10-CM | POA: Diagnosis not present

## 2018-08-16 DIAGNOSIS — E1165 Type 2 diabetes mellitus with hyperglycemia: Secondary | ICD-10-CM | POA: Diagnosis not present

## 2018-08-16 DIAGNOSIS — I483 Typical atrial flutter: Secondary | ICD-10-CM | POA: Diagnosis not present

## 2018-08-16 DIAGNOSIS — E1140 Type 2 diabetes mellitus with diabetic neuropathy, unspecified: Secondary | ICD-10-CM | POA: Diagnosis not present

## 2018-08-16 DIAGNOSIS — Z7901 Long term (current) use of anticoagulants: Secondary | ICD-10-CM | POA: Diagnosis not present

## 2018-08-16 DIAGNOSIS — I10 Essential (primary) hypertension: Secondary | ICD-10-CM | POA: Diagnosis not present

## 2018-08-16 LAB — POCT INR: INR: 2.3 (ref 2.0–3.0)

## 2018-08-16 NOTE — Patient Instructions (Signed)
Description   Continue taking 1/2 pill everyday except 1 pill on Mondays, Wednesdays and Fridays. Recheck in 3 weeks.

## 2018-08-17 LAB — BASIC METABOLIC PANEL
BUN/Creatinine Ratio: 27 — ABNORMAL HIGH (ref 10–24)
BUN: 34 mg/dL — ABNORMAL HIGH (ref 8–27)
CO2: 28 mmol/L (ref 20–29)
Calcium: 9.1 mg/dL (ref 8.6–10.2)
Chloride: 95 mmol/L — ABNORMAL LOW (ref 96–106)
Creatinine, Ser: 1.26 mg/dL (ref 0.76–1.27)
GFR calc Af Amer: 65 mL/min/{1.73_m2} (ref >59)
GFR calc non Af Amer: 57 mL/min/{1.73_m2} — ABNORMAL LOW (ref >59)
Glucose: 128 mg/dL — ABNORMAL HIGH (ref 65–99)
Potassium: 5.2 mmol/L (ref 3.5–5.2)
Sodium: 139 mmol/L (ref 134–144)

## 2018-08-21 ENCOUNTER — Other Ambulatory Visit: Payer: Self-pay

## 2018-08-28 ENCOUNTER — Telehealth: Payer: Self-pay | Admitting: Internal Medicine

## 2018-08-28 NOTE — Telephone Encounter (Signed)
Disregard

## 2018-09-02 ENCOUNTER — Telehealth: Payer: Self-pay

## 2018-09-02 NOTE — Telephone Encounter (Signed)
    COVID-19 Pre-Screening Questions:  . In the past 7 to 10 days have you had a cough,  shortness of breath, headache, congestion, fever (100 or greater) body aches, chills, sore throat, or sudden loss of taste or sense of smell? No . Have you been around anyone with known Covid 19. No . Have you been around anyone who is awaiting Covid 19 test results in the past 7 to 10 days? No . Have you been around anyone who has been exposed to Covid 19, or has mentioned symptoms of Covid 19 within the past 7 to 10 days? No  If you have any concerns/questions about symptoms patients report during screening (either on the phone or at threshold). Contact the provider seeing the patient or DOD for further guidance.  If neither are available contact a member of the leadership team.       Pt answered no to all the covid-19 prescreening questions. I asked the pt to wear a mask if he has one. I let the pt know that we are reducing the amount of people coming into the office and if he can physically come to his appointment alone to please do so. I told him if anything changes between now and his appointment time to please call and let us know. The pt verbalized understanding.

## 2018-09-03 ENCOUNTER — Ambulatory Visit (INDEPENDENT_AMBULATORY_CARE_PROVIDER_SITE_OTHER): Payer: Medicare Other | Admitting: *Deleted

## 2018-09-03 ENCOUNTER — Other Ambulatory Visit: Payer: Self-pay

## 2018-09-03 DIAGNOSIS — I442 Atrioventricular block, complete: Secondary | ICD-10-CM | POA: Diagnosis not present

## 2018-09-03 DIAGNOSIS — Z950 Presence of cardiac pacemaker: Secondary | ICD-10-CM

## 2018-09-03 LAB — CUP PACEART INCLINIC DEVICE CHECK
Battery Impedance: 1749 Ohm
Battery Remaining Longevity: 42 mo
Battery Voltage: 2.74 V
Brady Statistic RV Percent Paced: 99 %
Date Time Interrogation Session: 20200804092556
Implantable Lead Implant Date: 20030730
Implantable Lead Implant Date: 20030730
Implantable Lead Location: 753859
Implantable Lead Location: 753860
Implantable Lead Model: 5076
Implantable Lead Model: 5076
Implantable Pulse Generator Implant Date: 20111004
Lead Channel Impedance Value: 450 Ohm
Lead Channel Impedance Value: 67 Ohm
Lead Channel Pacing Threshold Amplitude: 0.5 V
Lead Channel Pacing Threshold Pulse Width: 0.4 ms
Lead Channel Setting Pacing Amplitude: 2.5 V
Lead Channel Setting Pacing Pulse Width: 0.4 ms
Lead Channel Setting Sensing Sensitivity: 2 mV

## 2018-09-03 NOTE — Patient Instructions (Addendum)
Follow-up with Dr. Lovena Le in [place] in February 2021. You will receive a reminder letter in the mail to schedule this appointment.

## 2018-09-03 NOTE — Progress Notes (Signed)
Pacemaker check in clinic. Normal device function. Threshold, sensing, impedance consistent with previous measurements. Device programmed to maximize longevity. Permanent AF, +warfarin. 2 high ventricular rates noted--longest 12 beats, likely NSVT per markers/EGM, patient asymptomatic. Device programmed at appropriate safety margins. Histogram distribution appropriate for patient activity level. Estimated longevity 3.5 years. Patient declines Carelink monitoring due to cost. Patient education completed. ROV with Dr. Lovena Le in [place] in 03/2019.

## 2018-09-06 ENCOUNTER — Other Ambulatory Visit: Payer: Self-pay

## 2018-09-06 ENCOUNTER — Ambulatory Visit (INDEPENDENT_AMBULATORY_CARE_PROVIDER_SITE_OTHER): Payer: Medicare Other | Admitting: Pharmacist Clinician (PhC)/ Clinical Pharmacy Specialist

## 2018-09-06 DIAGNOSIS — I483 Typical atrial flutter: Secondary | ICD-10-CM | POA: Diagnosis not present

## 2018-09-06 DIAGNOSIS — Z7901 Long term (current) use of anticoagulants: Secondary | ICD-10-CM

## 2018-09-06 DIAGNOSIS — I4891 Unspecified atrial fibrillation: Secondary | ICD-10-CM

## 2018-09-06 LAB — POCT INR: INR: 2.5 (ref 2.0–3.0)

## 2018-09-12 ENCOUNTER — Other Ambulatory Visit: Payer: Self-pay

## 2018-09-12 MED ORDER — TIZANIDINE HCL 4 MG PO TABS: ORAL_TABLET | ORAL | 2 refills | Status: AC

## 2018-09-13 ENCOUNTER — Other Ambulatory Visit: Payer: Self-pay

## 2018-09-13 NOTE — Progress Notes (Signed)
[name] [name], [place] [address] Phone: [phone] Subjective:   Fontaine No, am serving as a scribe for Dr. Hulan Saas.  I'm seeing this patient by the request  of:    CC: Left hip, knee, ankle pain  QIW:[id number]   06/11/2018 Worsening arthritis in the right knee but does have some of the left knee.  Injection given today.  I do believe the left side of the leg has some weakness for more of a lumbar radiculopathy that have been longstanding.  We discussed the right way to use his cane.  Encouraged continued use the gabapentin.  Hopefully patient does well with the injection.  Follow-up again in 4 weeksWorsening arthritis in the right knee but does have some of the left knee.  Injection given today.  I do believe the left side of the leg has some weakness for more of a lumbar radiculopathy that have been longstanding.  We discussed the right way to use his cane.  Encouraged continued use the gabapentin.  Hopefully patient does well with the injection.  Follow-up again in 4 weeks  Update 09/16/2018 [name] is a 73 y.o. male coming in with complaint of left knee pain. Did have some relief from the injection in May in his knee.  Patient states that the left knee starting to get more discomfort again.  States that is also getting more of a left hip pain.  Worse with sleeping at night.  Patient states going from a seated to standing position sometimes will feel like has increasing discomfort and pain.  Sometimes radicular symptoms all the way down to the ankle.  Feels like he can roll his ankle on a regular basis as well.    Past Medical History:  Diagnosis Date  . Aortic root dilatation ([facility]) 02/02/2011  . Arthritis    "lower back; going back down both my sciatic nerves"  . 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 BENIGN PROSTATIC HYPERTROPHY 08/29/2006   takes Flomax daily  . 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  Cataracts, bilateral    immature  . CHF (congestive heart failure) (HCC)    takes Lasix daily  . Chronic back pain    HNP   . CORONARY ARTERY DISEASE 08/29/2006   takes Coumadin daily  . DEPRESSION 08/29/2006  . DIABETES MELLITUS, TYPE II 08/29/2006   takes Metformin,Januvia,and Glipizide  daily  . DIASTOLIC HEART FAILURE, CHRONIC 06/09/2008  . GOUT 04/22/2007   takes Allopurinol daily  . History of migraine    51yr ago  . HYPERLIPIDEMIA 08/29/2006   takes Atorvastatin daily  . HYPERTENSION 08/29/2006   takes Lisinopril daily  . INSOMNIA-SLEEP DISORDER-UNSPEC 10/23/2007  . Left lumbar radiculopathy 05/30/2010  . LUMBAR RADICULOPATHY, RIGHT 06/10/2007  . Muscle spasm    takes Zanaflex daily  . Myocardial infarction ([facility] 12/27/10   "I've had several MIs"  . 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 PACEMAKER, PERMANENT 03/04/2008   pt denies this date  . Peripheral neuropathy    takes Gabapentin daily  . Presence of permanent cardiac pacemaker   . Shortness of breath dyspnea    "all my life" with exertion  . Sleep apnea    "if I lay flat I quit breathing; HOB up I'm fine"   Past Surgical History:  Procedure Laterality Date  . COLONOSCOPY    . CORONARY ANGIOPLASTY WITH STENT PLACEMENT  12/27/10   "I've had a total of 9  cardiac stents put in"  . CORONARY ARTERY BYPASS GRAFT  1992   CABG X 2  . ESOPHAGOGASTRODUODENOSCOPY N/A 12/01/2013   Procedure: ESOPHAGOGASTRODUODENOSCOPY (EGD);  Surgeon: Lafayette Dragon, MD;  Location: [facility];  Service: Endoscopy;  Laterality: N/A;  . INSERT / REPLACE / REMOVE PACEMAKER  ~ 2004   initial pacemaker placement  . INSERT / REPLACE / REMOVE PACEMAKER  10/2009   generator change  . LUMBAR LAMINECTOMY/DECOMPRESSION MICRODISCECTOMY Right 03/23/2014   Procedure: LAMINECTOMY AND FORAMINOTOMY RIGHT LUMBAR THREE-FOUR,LUMBAR FOUR-FIVE, LUMBAR FIVE-SACRAL ONE;  Surgeon: Charlie Pitter, MD;  Location: [facility];  Service: Neurosurgery;  Laterality:  Right;  right  . LUMBAR LAMINECTOMY/DECOMPRESSION MICRODISCECTOMY Left 12/01/2014   Procedure: Left Lumbar Three-Four, Lumbar Four-Five Laminectomy and Foraminotomy;  Surgeon: Earnie Larsson, MD;  Location: [facility];  Service: Neurosurgery;  Laterality: Left;  . s/p left arm surgury after work accident  1991   "2000# steel fell on it"  . s/p right hand surgury for foreign object  1970's   "piece of wood went in my hand; had to get that out"   Social History   Socioeconomic History  . Marital status: Married    Spouse name: Not on file  . Number of children: 2  . Years of education: Not on file  . Highest education level: Not on file  Occupational History  . Occupation: prior work Designer, industrial/product: UNEMPLOYED  . Occupation: disabled since 2004  Social Needs  . Financial resource strain: Not on file  . Food insecurity    Worry: Not on file    Inability: Not on file  . Transportation needs    Medical: Not on file    Non-medical: Not on file  Tobacco Use  . Smoking status: Former Smoker    Packs/day: 3.00    Years: 9.00    Pack years: 27.00    Types: Cigarettes    Quit date: 02/26/1973    Years since quitting: 45.5  . Smokeless tobacco: Former Systems developer  . Tobacco comment: quit smoking 39yr ago  Substance and Sexual Activity  . Alcohol use: No    Alcohol/week: 0.0 standard drinks  . Drug use: No    Comment: "used pouches of tobacco for a long time; quit those 12/09/1970"  . Sexual activity: Yes  Lifestyle  . Physical activity    Days per week: Not on file    Minutes per session: Not on file  . Stress: Not on file  Relationships  . Social [place] phone: Not on file    Gets together: Not on file    Attends religious service: Not on file    Active member of club or organization: Not on file    Attends meetings of clubs or organizations: Not on file    Relationship status: Not on file  Other Topics Concern  . Not on file  Social History Narrative   . Not on file   Allergies  Allergen Reactions  . Crestor [Rosuvastatin Calcium] Other (See Comments)    Urination of blood  . Latex Rash and Other (See Comments)    Gloves cause welts on hands!!   Family History  Problem Relation Age of Onset  . Diabetes Mother   . Diabetes Sister   . Heart disease Sister        2 sister died with heart disease  . Coronary artery disease Other 50  male, first degree relative  . Diabetes Other        1st degree relative  . Diabetes Sister   . Lung cancer Sister        deceased  . Diabetes Sister     Current Outpatient Medications (Endocrine & Metabolic):  .  glipiZIDE (GLUCOTROL XL) 10 MG 24 hr tablet, Take 1 tablet (10 mg total) by mouth daily with breakfast. .  metFORMIN (GLUCOPHAGE) 500 MG tablet, 4 tab by mouth daily .  sitaGLIPtin (JANUVIA) 100 MG tablet, Take 1 tablet (100 mg total) by mouth daily.  Current Outpatient Medications (Cardiovascular):  .  atorvastatin (LIPITOR) 80 MG tablet, Take 1 tablet (80 mg total) by mouth daily. .  carvedilol (COREG) 25 MG tablet, Take 1 tablet (25 mg total) by mouth 2 (two) times daily. .  furosemide (LASIX) 40 MG tablet, Take 2 tablets (80 mg total) by mouth 2 (two) times daily. Marland Kitchen  lisinopril (PRINIVIL,ZESTRIL) 2.5 MG tablet, Take 1 tablet (2.5 mg total) by mouth daily. .  metolazone (ZAROXOLYN) 2.5 MG tablet, Take 1 tablet (2.5 mg total) by mouth daily. For 2 days with one extra potassium tablet both days.  Current Outpatient Medications (Respiratory):  .  albuterol (PROAIR HFA) 108 (90 Base) MCG/ACT inhaler, Inhale 2 puffs into the lungs every 6 (six) hours as needed for wheezing or shortness of breath. .  benzonatate (TESSALON PERLES) 100 MG capsule, 1-2 tab by mouth every 6 hrs as needed for cough .  dextromethorphan-guaiFENesin (MUCINEX DM) 30-600 MG 12hr tablet, Take 1 tablet by mouth 2 (two) times daily. .  fluticasone (FLONASE) 50 MCG/ACT nasal spray, Place 2 sprays into both nostrils  daily.  Current Outpatient Medications (Analgesics):  .  allopurinol (ZYLOPRIM) 300 MG tablet, Take 1 tablet (300 mg total) by mouth daily. Marland Kitchen  HYDROcodone-acetaminophen (NORCO) 10-325 MG tablet, Take 1 tablet by mouth daily as needed.  Current Outpatient Medications (Hematological):  .  warfarin (COUMADIN) 5 MG tablet, TAKE 1/2 TO 1 TABLET BY MOUTH DAILY AS DIRECTED BY COUMADIN CLINIC  Current Outpatient Medications (Other):  .  Blood Glucose Monitoring Suppl (ONE TOUCH ULTRA 2) w/Device KIT, Use as directed .  Cholecalciferol (VITAMIN D) 1000 UNITS capsule, Take 1,000 Units by mouth 2 (two) times daily. Reported on 02/15/2015 .  donepezil (ARICEPT) 5 MG tablet, Take 1 tablet (5 mg total) by mouth daily. Marland Kitchen  gabapentin (NEURONTIN) 300 MG capsule, TAKE 2 CAPSULES BY MOUTH 3  TIMES DAILY .  glucose blood (ONE TOUCH ULTRA TEST) test strip, CHECK BLOOD SUGAR TWO TIMES DAILY .  Multiple Vitamin (MULTIVITAMIN WITH MINERALS) TABS tablet, Take 1 tablet by mouth every morning. Glory Rosebush DELICA LANCETS 32T MISC, USE AS DIRECTED THREE TIMES DAILY TO CHECK BLOOD SUGAR .  oxybutynin (DITROPAN XL) 10 MG 24 hr tablet, Take 1 tablet (10 mg total) by mouth at bedtime. .  pantoprazole (PROTONIX) 40 MG tablet, Take 1 tablet (40 mg total) by mouth daily. Take 30 minutes before a meal. .  tamsulosin (FLOMAX) 0.4 MG CAPS capsule, 1 tab by mouth twice per day .  tiZANidine (ZANAFLEX) 4 MG tablet, TAKE 1 TABLET BY MOUTH   EVERY 6 HOURS AS NEEDED FOR MUSCLE SPASMS. Marland Kitchen  traZODone (DESYREL) 50 MG tablet, Take 0.5-1 tablets (25-50 mg total) by mouth at bedtime as needed for sleep.    Past medical history, social, surgical and family history all reviewed in electronic medical record.  No pertanent information unless stated regarding to the  chief complaint.   Review of Systems:  No headache, visual changes, nausea, vomiting, diarrhea, constipation, dizziness, abdominal pain, skin rash, fevers, chills, night sweats,  weight loss, swollen lymph nodes,  chest pain, shortness of breath, mood changes.  Positive muscle aches, joint swelling, body aches  Objective  Blood pressure 124/60, pulse 60, height 5' 6.75" (1.695 m), SpO2 (!) 63 %.   General: No apparent distress alert and oriented x3 mood and affect normal, dressed appropriately.  HEENT: Pupils equal, extraocular movements intact  Respiratory: Patient's speak in full sentences and does not appear short of breath  Cardiovascular: Trace lower extremity edema, non tender, no erythema  Skin: Warm dry intact with no signs of infection or rash on extremities or on axial skeleton.  Abdomen: Soft nontender  Neuro: Cranial nerves II through XII are intact, neurovascularly intact in all extremities with 2+ DTRs and 2+ pulses.  Lymph: No lymphadenopathy of posterior or anterior cervical chain or axillae bilaterally.  Gait severely antalgic favoring the left leg MSK:  tender with full range of motion and good stability and symmetric strength and tone of shoulders, elbows, wrist bilaterally.  Left hip exam shows the patient has severe tenderness to palpation over the greater trochanteric area on the left side.  Patient has a positive straight leg test with radicular symptoms and 20 degrees of forward flexion.  Unable to do [place] secondary to the tightness.  Left knee exam does have instability with valgus force.  Tender to palpation over the medial joint line space. Left ankle has some mild overpronation noted.  Patient is tender to palpation of the medial and lateral planes.  Patient does not have any swelling of the talar dome.  Patient does have limited range of motion but symmetric to the contralateral side.  After informed written and verbal consent, patient was seated on exam table. Left knee was prepped with alcohol swab and utilizing anterolateral approach, patient's left knee space was injected with 4:1  marcaine 0.5%: Kenalog 70m/dL. Patient tolerated the  procedure well without immediate complications.  After verbal consent patient was prepped in sterile fashion with alcohol swabs. Ethyl chloride used patient was injected with a 22-gauge 3 inch needle into the left lateral hip in the greater trochanteric area under ultrasound guidance. Picture was taken. Patient had 4 cc of 0.5% Marcaine and 1 cc of Kenalog 40 mg/dL injected. Patient tolerated the procedure well and no blood loss. Pain completely resolved after injection stating proper placement. Post injection instructions given.   Impression and Recommendations:     This case required medical decision making of moderate complexity. The above documentation has been reviewed and is accurate and complete ZLyndal Pulley DO       Note: This dictation was prepared with Dragon dictation along with smaller phrase technology. Any transcriptional errors that result from this process are unintentional.

## 2018-09-13 NOTE — Patient Outreach (Addendum)
[facility]) Care Management  09/13/2018   [name] 14-Oct-1945 [id number]      Outreach attempt # 1 to the patient.  HIPAA provided by the patient.  He and his wife completed the assessment.  Social: The patient lives in the home with his wife [name].  She is active in his care.  He states that he is independent with his daily activities.  He states he has pain all over his body daily and today he rates the pain at 2/10.  He has medications to help with the pain.  He denies any falls.  He states that he has transportation.  He states the equipment he has in the home include: eye glasses, dentures (both plates), blood pressure cuff, CBG meter ( one touch), wheel chair, caned, and walker.  Conditions: Per chart review and communication with the patient his medical conditions include:  HTN, CAD, CHF, A-Fib, GERD, GOUT, Type II diabetes, Chronic left lumbar radiculopathy, BPH, CKD stage II, Hyperlipidemia, Obesity, and Anemia.  The patient states that he checks his blood sugars daily. He uses a one touch glucometer. Today his FBS was 135.  He does not write down his values.  Encouraged him to record his values.  He verbalized understanding.  His a1c was 7.3 01/31/18.  He states that he does not follow any special diet.  He likes to eat good "country food".  The only exercise that he does is chores around the home.  Medications: The patient is on twenty two medications.  He states that he is receiving help with his medications at this time.  He is able to manage his medications.  Appointments: 09/16/18 Dr. Tamala Julian, 09/24/18 Coumadin clinic and Dr.Creenshaw.  Advanced Directives:The patient states that he and his wife have living wills.     Current Medications:  Current Outpatient Medications  Medication Sig Dispense Refill  . albuterol (PROAIR HFA) 108 (90 Base) MCG/ACT inhaler Inhale 2 puffs into the lungs every 6 (six) hours as needed for wheezing or shortness of breath. 3  Inhaler 3  . allopurinol (ZYLOPRIM) 300 MG tablet Take 1 tablet (300 mg total) by mouth daily. 90 tablet 3  . atorvastatin (LIPITOR) 80 MG tablet Take 1 tablet (80 mg total) by mouth daily. 90 tablet 3  . benzonatate (TESSALON PERLES) 100 MG capsule 1-2 tab by mouth every 6 hrs as needed for cough 60 capsule 1  . Blood Glucose Monitoring Suppl (ONE TOUCH ULTRA 2) w/Device KIT Use as directed 1 each 3  . carvedilol (COREG) 25 MG tablet Take 1 tablet (25 mg total) by mouth 2 (two) times daily. 180 tablet 3  . Cholecalciferol (VITAMIN D) 1000 UNITS capsule Take 1,000 Units by mouth 2 (two) times daily. Reported on 02/15/2015    . dextromethorphan-guaiFENesin (MUCINEX DM) 30-600 MG 12hr tablet Take 1 tablet by mouth 2 (two) times daily. 30 tablet 0  . donepezil (ARICEPT) 5 MG tablet Take 1 tablet (5 mg total) by mouth daily. 90 tablet 3  . fluticasone (FLONASE) 50 MCG/ACT nasal spray Place 2 sprays into both nostrils daily. 48 g 5  . furosemide (LASIX) 40 MG tablet Take 2 tablets (80 mg total) by mouth 2 (two) times daily. 720 tablet 3  . gabapentin (NEURONTIN) 300 MG capsule TAKE 2 CAPSULES BY MOUTH 3  TIMES DAILY 540 capsule 3  . glipiZIDE (GLUCOTROL XL) 10 MG 24 hr tablet Take 1 tablet (10 mg total) by mouth daily with breakfast. 90 tablet 3  .  glucose blood (ONE TOUCH ULTRA TEST) test strip CHECK BLOOD SUGAR TWO TIMES DAILY 100 each 3  . HYDROcodone-acetaminophen (NORCO) 10-325 MG tablet Take 1 tablet by mouth daily as needed. 30 tablet 0  . lisinopril (PRINIVIL,ZESTRIL) 2.5 MG tablet Take 1 tablet (2.5 mg total) by mouth daily. 90 tablet 3  . metFORMIN (GLUCOPHAGE) 500 MG tablet 4 tab by mouth daily 360 tablet 3  . metolazone (ZAROXOLYN) 2.5 MG tablet Take 1 tablet (2.5 mg total) by mouth daily. For 2 days with one extra potassium tablet both days. 2 tablet 0  . Multiple Vitamin (MULTIVITAMIN WITH MINERALS) TABS tablet Take 1 tablet by mouth every morning.    Glory Rosebush DELICA LANCETS 02I MISC USE  AS DIRECTED THREE TIMES DAILY TO CHECK BLOOD SUGAR 300 each 0  . oxybutynin (DITROPAN XL) 10 MG 24 hr tablet Take 1 tablet (10 mg total) by mouth at bedtime. 90 tablet 3  . pantoprazole (PROTONIX) 40 MG tablet Take 1 tablet (40 mg total) by mouth daily. Take 30 minutes before a meal. 30 tablet 2  . sitaGLIPtin (JANUVIA) 100 MG tablet Take 1 tablet (100 mg total) by mouth daily. 90 tablet 3  . tamsulosin (FLOMAX) 0.4 MG CAPS capsule 1 tab by mouth twice per day 180 capsule 3  . tiZANidine (ZANAFLEX) 4 MG tablet TAKE 1 TABLET BY MOUTH   EVERY 6 HOURS AS NEEDED FOR MUSCLE SPASMS. 180 tablet 2  . traZODone (DESYREL) 50 MG tablet Take 0.5-1 tablets (25-50 mg total) by mouth at bedtime as needed for sleep. 90 tablet 1  . warfarin (COUMADIN) 5 MG tablet TAKE 1/2 TO 1 TABLET BY MOUTH DAILY AS DIRECTED BY COUMADIN CLINIC 90 tablet 1   No current facility-administered medications for this visit.     Functional Status:  In your present state of health, do you have any difficulty performing the following activities: 09/13/2018 01/16/2018  Hearing? Y Y  Comment patient needs hearing aids but can't afford them -  Vision? N N  Difficulty concentrating or making decisions? N N  Walking or climbing stairs? Y Y  Comment When the paitent has pain in his feet from gout -  Dressing or bathing? N Y  Doing errands, shopping? N N  Using the Toilet? N -  In the past six months, have you accidently leaked urine? N -  Do you have problems with loss of bowel control? N -  Managing your Medications? N -  Managing your Finances? N -  Housekeeping or managing your Housekeeping? N -  Some recent data might be hidden    Fall/Depression Screening: Fall Risk  09/13/2018 09/06/2017 05/17/2017  Falls in the past year? 0 No No  Number falls in past yr: - - -  Comment - - -  Injury with Fall? - - -  Risk Factor Category  - - -  Comment - - -  Risk for fall due to : - - -  Follow up - - -   PHQ 2/9 Scores 09/13/2018  08/15/2018 09/06/2017 02/01/2017 10/13/2016 09/22/2016 08/23/2016  PHQ - 2 Score 0 0 0 1 0 0 0    Assessment: Patient will benefit from health coach outreach for disease management and support.  THN CM Care Plan Problem One     Most Recent Value  Care Plan for Problem One  Active  THN Long Term Goal   In 90 days the patient will lower his a1c of 7.3 by 1-2 points  [facility]  Long Term Goal Start Date  09/13/18  Interventions for Problem One Long Term Goal  Reviewed cbg record,  discussed food and fluid  intake, and discussed medication management encouraged the patient to continue to check his blood sugars daily a record his values.     Plan: RN Health Coach will provide ongoing education for patient on diabetes through phone calls and sending printed information to patient for further discussion.   RN Health Coach will send Booklet on diabetes.   RN Health Coach will send initial barriers letter, assessment, and care plan to primary care physician.   [place] will send Consent form. Referral sent to Social Work for information on resources that could help him with his hearing aids. Will follow up next month to make sure the patient received the educational material. [place] will contact patient in the month of September and patient agrees to next outreach.    [name] RN, BSN, [place] Direct Dial:  [phone]  Fax: [phone]

## 2018-09-16 ENCOUNTER — Encounter: Payer: Self-pay | Admitting: Family Medicine

## 2018-09-16 ENCOUNTER — Ambulatory Visit (INDEPENDENT_AMBULATORY_CARE_PROVIDER_SITE_OTHER): Payer: Medicare Other | Admitting: Family Medicine

## 2018-09-16 ENCOUNTER — Other Ambulatory Visit: Payer: Self-pay

## 2018-09-16 DIAGNOSIS — M7062 Trochanteric bursitis, left hip: Secondary | ICD-10-CM

## 2018-09-16 DIAGNOSIS — M25562 Pain in left knee: Secondary | ICD-10-CM | POA: Diagnosis not present

## 2018-09-16 DIAGNOSIS — R269 Unspecified abnormalities of gait and mobility: Secondary | ICD-10-CM | POA: Diagnosis not present

## 2018-09-16 NOTE — Assessment & Plan Note (Signed)
Patient feels like it is more secondary to the ankle.  Aircast given today.  Discussed which activities to do which wants to avoid.  Posture and ergonomics.  Discussed proper shoes.  Follow-up again in 4 to 6 weeks

## 2018-09-16 NOTE — Assessment & Plan Note (Signed)
Injected left GT today.  Patient tolerated procedure well.  Discussed icing regimen and home exercises.  Differential includes lumbar radiculopathy.  No change in medications.  Follow-up again in 3 months

## 2018-09-16 NOTE — Patient Instructions (Signed)
Good to see you! Injected hip and knee today Wear Aircast as much as possible See me again in 3 weeks

## 2018-09-16 NOTE — Patient Outreach (Signed)
[facility]) Care Management  09/16/2018  [name] [birth year] [id number]   Social work referral received from [facility], [name].  "Patient is a 73 y/o male was has problems with his hearing and needs hearing aids. He states that he can't afford them. He would like to know of any resources that could help him to purchase them" Outreach to patient today.  Patient was asleep at time of call so BSW spoke with his spouse.  Informed her about program through [place] [facility] that may be able to assist with one hearing aid if patient qualifies.  Informed her of process for referral/assessment/assistance.  Spouse stated that she will discuss with patient.  Will call again before the end of the week and further assist with process if patient would like to pursue this service.  Ronn Melena, BSW Social Worker [phone]

## 2018-09-16 NOTE — Assessment & Plan Note (Signed)
Left knee given an injection today.  Degenerative arthritic changes noted.  Patient also has left lumbar radiculopathy that could be contributing to some of the discomfort and pain.  Discussed icing regimen, home exercises, continue to use the cane for stability.  Follow-up again in 12 weeks

## 2018-09-18 ENCOUNTER — Other Ambulatory Visit: Payer: Self-pay

## 2018-09-18 NOTE — Patient Outreach (Signed)
[facility]) Care Management  09/18/2018  [name] Mar 27, 1945 [id number]   Follow up call to determine if patient would like to pursue applying for hearing aide through [place] [facility] and [facility] of Hearing.  Asked to speak with patient but spouse reported that he was not available.  Per spouse, they received an offer in the mail for a free hearing aide with the purchase of one so they are going to use this option.  BSW closing case at this time.  Ronn Melena, BSW Social Worker [phone]

## 2018-09-23 NOTE — Progress Notes (Signed)
HPI: FU coronary artery disease status post coronary artery bypass graft, atrial flutter and previous pacemaker placement. Last cardiac catheterization in May of 2010 showed a normal left main, a 50-70% circumflex, a totally occluded LAD, and a focal 70% right coronary artery. The saphenous vein graft to the circumflex was occluded. The LIMA to the LAD was patent. Ejection fraction was 60%. Carotid Dopplers in October of 2011 were normal. At previous OV pt noted to be in atrial flutter; seen by GT and rate control and anticoagulation recommended. Abdominal CT 12/18 showed no aneurysm; 4 mm nodule and fu could be considered 12 months if pt high risk.  Last echo 2019 showed normal LV systolic function, mildly dilated aortic root, moderate left atrial enlargement, mild right ventricular enlargement right atrial enlargement.  Patient admitted May 2019 with acute renal failure with creatinine 4.33.  His Lasix was held with improvement and he was discharged on lower dose. Nuclear study June 2019 showed pacing induced septal defect but otherwise normal perfusion and ejection fraction 49%.  Patient seen July 2020 with dyspnea and felt to be volume overloaded.  Metolazone was added for 2 days.  Also noted to have hyperkalemia in July 2020. Kdur DCed. Since last seen,patient has dyspnea with more vigorous activities.  No orthopnea, PND, pedal edema, chest pain or syncope.  Current Outpatient Medications  Medication Sig Dispense Refill  . albuterol (PROAIR HFA) 108 (90 Base) MCG/ACT inhaler Inhale 2 puffs into the lungs every 6 (six) hours as needed for wheezing or shortness of breath. 3 Inhaler 3  . allopurinol (ZYLOPRIM) 300 MG tablet Take 1 tablet (300 mg total) by mouth daily. 90 tablet 3  . atorvastatin (LIPITOR) 80 MG tablet Take 1 tablet (80 mg total) by mouth daily. 90 tablet 3  . benzonatate (TESSALON PERLES) 100 MG capsule 1-2 tab by mouth every 6 hrs as needed for cough 60 capsule 1  . Blood  Glucose Monitoring Suppl (ONE TOUCH ULTRA 2) w/Device KIT Use as directed 1 each 3  . carvedilol (COREG) 25 MG tablet Take 1 tablet (25 mg total) by mouth 2 (two) times daily. 180 tablet 3  . Cholecalciferol (VITAMIN D) 1000 UNITS capsule Take 1,000 Units by mouth 2 (two) times daily. Reported on 02/15/2015    . dextromethorphan-guaiFENesin (MUCINEX DM) 30-600 MG 12hr tablet Take 1 tablet by mouth 2 (two) times daily. 30 tablet 0  . donepezil (ARICEPT) 5 MG tablet Take 1 tablet (5 mg total) by mouth daily. 90 tablet 3  . fluticasone (FLONASE) 50 MCG/ACT nasal spray Place 2 sprays into both nostrils daily. 48 g 5  . furosemide (LASIX) 40 MG tablet Take 2 tablets (80 mg total) by mouth 2 (two) times daily. 720 tablet 3  . gabapentin (NEURONTIN) 300 MG capsule TAKE 2 CAPSULES BY MOUTH 3  TIMES DAILY 540 capsule 3  . glipiZIDE (GLUCOTROL XL) 10 MG 24 hr tablet Take 1 tablet (10 mg total) by mouth daily with breakfast. 90 tablet 3  . glucose blood (ONE TOUCH ULTRA TEST) test strip CHECK BLOOD SUGAR TWO TIMES DAILY 100 each 3  . HYDROcodone-acetaminophen (NORCO) 10-325 MG tablet Take 1 tablet by mouth daily as needed. 30 tablet 0  . lisinopril (PRINIVIL,ZESTRIL) 2.5 MG tablet Take 1 tablet (2.5 mg total) by mouth daily. 90 tablet 3  . metFORMIN (GLUCOPHAGE) 500 MG tablet 4 tab by mouth daily 360 tablet 3  . metolazone (ZAROXOLYN) 2.5 MG tablet Take 1 tablet (2.5 mg  total) by mouth daily. For 2 days with one extra potassium tablet both days. 2 tablet 0  . Multiple Vitamin (MULTIVITAMIN WITH MINERALS) TABS tablet Take 1 tablet by mouth every morning.    Glory Rosebush DELICA LANCETS 81K MISC USE AS DIRECTED THREE TIMES DAILY TO CHECK BLOOD SUGAR 300 each 0  . oxybutynin (DITROPAN XL) 10 MG 24 hr tablet Take 1 tablet (10 mg total) by mouth at bedtime. 90 tablet 3  . pantoprazole (PROTONIX) 40 MG tablet Take 1 tablet (40 mg total) by mouth daily. Take 30 minutes before a meal. 30 tablet 2  . sitaGLIPtin (JANUVIA)  100 MG tablet Take 1 tablet (100 mg total) by mouth daily. 90 tablet 3  . tamsulosin (FLOMAX) 0.4 MG CAPS capsule 1 tab by mouth twice per day 180 capsule 3  . tiZANidine (ZANAFLEX) 4 MG tablet TAKE 1 TABLET BY MOUTH   EVERY 6 HOURS AS NEEDED FOR MUSCLE SPASMS. 180 tablet 2  . traZODone (DESYREL) 50 MG tablet Take 0.5-1 tablets (25-50 mg total) by mouth at bedtime as needed for sleep. 90 tablet 1  . warfarin (COUMADIN) 5 MG tablet TAKE 1/2 TO 1 TABLET BY MOUTH DAILY AS DIRECTED BY COUMADIN CLINIC 90 tablet 1   No current facility-administered medications for this visit.      Past Medical History:  Diagnosis Date  . Aortic root dilatation ([place]) 02/02/2011  . Arthritis    "lower back; going back down both my sciatic nerves"  . 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 BENIGN PROSTATIC HYPERTROPHY 08/29/2006   takes Flomax daily  . 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 Cataracts, bilateral    immature  . CHF (congestive heart failure) ([facility])    takes Lasix daily  . Chronic back pain    HNP   . CORONARY ARTERY DISEASE 08/29/2006   takes Coumadin daily  . DEPRESSION 08/29/2006  . DIABETES MELLITUS, TYPE II 08/29/2006   takes Metformin,Januvia,and Glipizide  daily  . DIASTOLIC HEART FAILURE, CHRONIC 06/09/2008  . GOUT 04/22/2007   takes Allopurinol daily  . History of migraine    72yr ago  . HYPERLIPIDEMIA 08/29/2006   takes Atorvastatin daily  . HYPERTENSION 08/29/2006   takes Lisinopril daily  . INSOMNIA-SLEEP DISORDER-UNSPEC 10/23/2007  . Left lumbar radiculopathy 05/30/2010  . LUMBAR RADICULOPATHY, RIGHT 06/10/2007  . Muscle spasm    takes Zanaflex daily  . [place] 12/27/10   "I've had several MIs"  . 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 PACEMAKER, PERMANENT 03/04/2008   pt denies this date  . Peripheral neuropathy    takes Gabapentin daily  . Presence of permanent cardiac pacemaker   . Shortness of breath dyspnea    "all my life" with  exertion  . Sleep apnea    "if I lay flat I quit breathing; HOB up I'm fine"    Past Surgical History:  Procedure Laterality Date  . COLONOSCOPY    . CORONARY ANGIOPLASTY WITH STENT PLACEMENT  12/27/10   "I've had a total of 9 cardiac stents put in"  . CORONARY ARTERY BYPASS GRAFT  1992   CABG X 2  . ESOPHAGOGASTRODUODENOSCOPY N/A 12/01/2013   Procedure: ESOPHAGOGASTRODUODENOSCOPY (EGD);  Surgeon: DLafayette Dragon MD;  [facility];  Service: Endoscopy;  Laterality: N/A;  . INSERT / REPLACE / REMOVE PACEMAKER  ~ 2004   initial pacemaker placement  . INSERT / REPLACE / REMOVE PACEMAKER  10/2009   generator change  . LUMBAR LAMINECTOMY/DECOMPRESSION  MICRODISCECTOMY Right 03/23/2014   Procedure: LAMINECTOMY AND FORAMINOTOMY RIGHT LUMBAR THREE-FOUR,LUMBAR FOUR-FIVE, LUMBAR FIVE-SACRAL ONE;  Surgeon: Charlie Pitter, MD;  Location: [facility];  Service: Neurosurgery;  Laterality: Right;  right  . LUMBAR LAMINECTOMY/DECOMPRESSION MICRODISCECTOMY Left 12/01/2014   Procedure: Left Lumbar Three-Four, Lumbar Four-Five Laminectomy and Foraminotomy;  Surgeon: Earnie Larsson, MD;  Location: [place] [facility];  Service: Neurosurgery;  Laterality: Left;  . s/p left arm surgury after work accident  1991   "2000# steel fell on it"  . s/p right hand surgury for foreign object  1970's   "piece of wood went in my hand; had to get that out"    Social History   Socioeconomic History  . Marital status: Married    Spouse name: Not on file  . Number of children: 2  . Years of education: Not on file  . Highest education level: Not on file  Occupational History  . Occupation: prior work Designer, industrial/product: UNEMPLOYED  . Occupation: disabled since 2004  Social Needs  . Financial resource strain: Not on file  . Food insecurity    Worry: Never true    Inability: Never true  . Transportation needs    Medical: No    Non-medical: No  Tobacco Use  . Smoking status: Former Smoker    Packs/day:  3.00    Years: 9.00    Pack years: 27.00    Types: Cigarettes    Quit date: 02/26/1973    Years since quitting: 45.6  . Smokeless tobacco: Former Systems developer  . Tobacco comment: quit smoking 47yr ago  Substance and Sexual Activity  . Alcohol use: No    Alcohol/week: 0.0 standard drinks  . Drug use: No    Comment: "used pouches of tobacco for a long time; quit those 12/09/1970"  . Sexual activity: Yes  Lifestyle  . Physical activity    Days per week: Not on file    Minutes per session: Not on file  . Stress: Not on file  Relationships  . Social [place] phone: Not on file    Gets together: Not on file    Attends religious service: Not on file    Active member of club or organization: Not on file    Attends meetings of clubs or organizations: Not on file    Relationship status: Not on file  . Intimate partner violence    Fear of current or ex partner: Not on file    Emotionally abused: Not on file    Physically abused: Not on file    Forced sexual activity: Not on file  Other Topics Concern  . Not on file  Social History Narrative  . Not on file    Family History  Problem Relation Age of Onset  . Diabetes Mother   . Diabetes Sister   . Heart disease Sister        2 sister died with heart disease  . Coronary artery disease Other 566      male, first degree relative  . Diabetes Other        1st degree relative  . Diabetes Sister   . Lung cancer Sister        deceased  . Diabetes Sister     ROS: no fevers or chills, productive cough, hemoptysis, dysphasia, odynophagia, melena, hematochezia, dysuria, hematuria, rash, seizure activity, orthopnea, PND, pedal edema, claudication. Remaining systems are negative.  Physical Exam: Well-developed well-nourished in  no acute distress.  Skin is warm and dry.  HEENT is normal.  Neck is supple.  Chest is clear to auscultation with normal expansion.  Cardiovascular exam is regular rate and rhythm.  Abdominal exam  nontender or distended. No masses palpated. Extremities show no edema. neuro grossly intact  A/P  1 coronary artery disease-no recurrent chest pain.  Last nuclear study showed no ischemia.  Continue medical therapy with statin.  No aspirin given need for anticoagulation.  2 atrial flutter-previous evaluation by Dr. Lovena Le and ablation not recommended.  Continue beta-blocker for rate control.  Continue Coumadin. Not on DOAC due to expense.  3 chronic diastolic congestive heart failure-continue present dose of Lasix.  Check potassium and renal function.  4 hypertension-patient's blood pressure is controlled.  Continue present medications and follow.  5 hyperlipidemia-continue statin.  6 prior pacemaker-followed by electrophysiology.    [name], MD

## 2018-09-24 ENCOUNTER — Ambulatory Visit (INDEPENDENT_AMBULATORY_CARE_PROVIDER_SITE_OTHER): Payer: Medicare Other | Admitting: Pharmacist

## 2018-09-24 ENCOUNTER — Ambulatory Visit (INDEPENDENT_AMBULATORY_CARE_PROVIDER_SITE_OTHER): Payer: Medicare Other | Admitting: Cardiology

## 2018-09-24 ENCOUNTER — Other Ambulatory Visit: Payer: Self-pay

## 2018-09-24 ENCOUNTER — Encounter: Payer: Self-pay | Admitting: Cardiology

## 2018-09-24 VITALS — BP 122/64 | HR 82 | Ht 71.0 in | Wt 205.0 lb

## 2018-09-24 DIAGNOSIS — I4891 Unspecified atrial fibrillation: Secondary | ICD-10-CM

## 2018-09-24 DIAGNOSIS — I5032 Chronic diastolic (congestive) heart failure: Secondary | ICD-10-CM | POA: Diagnosis not present

## 2018-09-24 DIAGNOSIS — I483 Typical atrial flutter: Secondary | ICD-10-CM

## 2018-09-24 DIAGNOSIS — Z7901 Long term (current) use of anticoagulants: Secondary | ICD-10-CM

## 2018-09-24 DIAGNOSIS — I10 Essential (primary) hypertension: Secondary | ICD-10-CM

## 2018-09-24 LAB — POCT INR: INR: 2.4 (ref 2.0–3.0)

## 2018-09-24 NOTE — Patient Instructions (Signed)
Medication Instructions:  NO CHANGE If you need a refill on your cardiac medications before your next appointment, please call your pharmacy.   Lab work: Your physician recommends that you HAVE LAB WORK TODAY If you have labs (blood work) drawn today and your tests are completely normal, you will receive your results only by: . MyChart Message (if you have MyChart) OR . A paper copy in the mail If you have any lab test that is abnormal or we need to change your treatment, we will call you to review the results.  Follow-Up: At [facility], you and your health needs are our priority.  As part of our continuing mission to provide you with exceptional heart care, we have created designated Provider Care Teams.  These Care Teams include your primary Cardiologist (physician) and Advanced Practice Providers (APPs -  Physician Assistants and Nurse Practitioners) who all work together to provide you with the care you need, when you need it. You will need a follow up appointment in 6 months.  Please call our office 2 months in advance to schedule this appointment.  You may see Brian Crenshaw, MD or one of the following Advanced Practice Providers on your designated Care Team:   Luke Kilroy, PA-C Krista Kroeger, PA-C . Callie Goodrich, PA-C     

## 2018-09-25 ENCOUNTER — Telehealth: Payer: Self-pay | Admitting: *Deleted

## 2018-09-25 DIAGNOSIS — I5032 Chronic diastolic (congestive) heart failure: Secondary | ICD-10-CM

## 2018-09-25 LAB — BASIC METABOLIC PANEL
BUN/Creatinine Ratio: 21 (ref 10–24)
BUN: 33 mg/dL — ABNORMAL HIGH (ref 8–27)
CO2: 24 mmol/L (ref 20–29)
Calcium: 8.8 mg/dL (ref 8.6–10.2)
Chloride: 97 mmol/L (ref 96–106)
Creatinine, Ser: 1.56 mg/dL — ABNORMAL HIGH (ref 0.76–1.27)
GFR calc Af Amer: 51 mL/min/{1.73_m2} — ABNORMAL LOW (ref >59)
GFR calc non Af Amer: 44 mL/min/{1.73_m2} — ABNORMAL LOW (ref >59)
Glucose: 263 mg/dL — ABNORMAL HIGH (ref 65–99)
Potassium: 5.4 mmol/L — ABNORMAL HIGH (ref 3.5–5.2)
Sodium: 136 mmol/L (ref 134–144)

## 2018-09-25 NOTE — Telephone Encounter (Addendum)
Spoke with [name] and caregiver, Aware of dr Jacalyn Lefevre recommendations. Lab orders mailed to the [name]   ----- Message from [name], MD sent at 09/25/2018  5:35 AM EDT ----- DC metolazone and make sure not taking KCL; bmet one week [name]

## 2018-10-08 DIAGNOSIS — I5032 Chronic diastolic (congestive) heart failure: Secondary | ICD-10-CM | POA: Diagnosis not present

## 2018-10-08 LAB — BASIC METABOLIC PANEL
BUN/Creatinine Ratio: 13 (ref 10–24)
BUN: 15 mg/dL (ref 8–27)
CO2: 23 mmol/L (ref 20–29)
Calcium: 9.1 mg/dL (ref 8.6–10.2)
Chloride: 99 mmol/L (ref 96–106)
Creatinine, Ser: 1.16 mg/dL (ref 0.76–1.27)
GFR calc Af Amer: 72 mL/min/{1.73_m2} (ref >59)
GFR calc non Af Amer: 63 mL/min/{1.73_m2} (ref >59)
Glucose: 152 mg/dL — ABNORMAL HIGH (ref 65–99)
Potassium: 5.2 mmol/L (ref 3.5–5.2)
Sodium: 138 mmol/L (ref 134–144)

## 2018-10-09 ENCOUNTER — Other Ambulatory Visit: Payer: Self-pay | Admitting: Cardiology

## 2018-10-14 ENCOUNTER — Other Ambulatory Visit: Payer: Self-pay

## 2018-10-14 NOTE — Patient Outreach (Signed)
[facility]) Care Management  10/14/2018   [name] [birth year] [id number]  Subjective: Successful outreach to the patient.  Two patient identifiers given.  The patient states that he is doing well.  He denies any pain or falls.  He states that his blood sugar this morning was 160.  He states that he is trying to monitor his diet and eat healthy.  He is sleeping well and taking his meds as he should.  He will see Dr Tamala Julian on the 17th of this month and when he goes he will check to see If they have flu shot available.   Current Medications:  Current Outpatient Medications  Medication Sig Dispense Refill  . albuterol (PROAIR HFA) 108 (90 Base) MCG/ACT inhaler Inhale 2 puffs into the lungs every 6 (six) hours as needed for wheezing or shortness of breath. 3 Inhaler 3  . allopurinol (ZYLOPRIM) 300 MG tablet Take 1 tablet (300 mg total) by mouth daily. 90 tablet 3  . atorvastatin (LIPITOR) 80 MG tablet Take 1 tablet (80 mg total) by mouth daily. 90 tablet 3  . benzonatate (TESSALON PERLES) 100 MG capsule 1-2 tab by mouth every 6 hrs as needed for cough 60 capsule 1  . Blood Glucose Monitoring Suppl (ONE TOUCH ULTRA 2) w/Device KIT Use as directed 1 each 3  . carvedilol (COREG) 25 MG tablet Take 1 tablet (25 mg total) by mouth 2 (two) times daily. 180 tablet 3  . Cholecalciferol (VITAMIN D) 1000 UNITS capsule Take 1,000 Units by mouth 2 (two) times daily. Reported on 02/15/2015    . dextromethorphan-guaiFENesin (MUCINEX DM) 30-600 MG 12hr tablet Take 1 tablet by mouth 2 (two) times daily. 30 tablet 0  . donepezil (ARICEPT) 5 MG tablet Take 1 tablet (5 mg total) by mouth daily. 90 tablet 3  . fluticasone (FLONASE) 50 MCG/ACT nasal spray Place 2 sprays into both nostrils daily. 48 g 5  . furosemide (LASIX) 40 MG tablet Take 2 tablets (80 mg total) by mouth 2 (two) times daily. 720 tablet 3  . gabapentin (NEURONTIN) 300 MG capsule TAKE 2 CAPSULES BY MOUTH 3  TIMES DAILY 540  capsule 3  . glipiZIDE (GLUCOTROL XL) 10 MG 24 hr tablet Take 1 tablet (10 mg total) by mouth daily with breakfast. 90 tablet 3  . glucose blood (ONE TOUCH ULTRA TEST) test strip CHECK BLOOD SUGAR TWO TIMES DAILY 100 each 3  . HYDROcodone-acetaminophen (NORCO) 10-325 MG tablet Take 1 tablet by mouth daily as needed. 30 tablet 0  . lisinopril (PRINIVIL,ZESTRIL) 2.5 MG tablet Take 1 tablet (2.5 mg total) by mouth daily. 90 tablet 3  . metFORMIN (GLUCOPHAGE) 500 MG tablet 4 tab by mouth daily 360 tablet 3  . Multiple Vitamin (MULTIVITAMIN WITH MINERALS) TABS tablet Take 1 tablet by mouth every morning.    Glory Rosebush DELICA LANCETS 00X MISC USE AS DIRECTED THREE TIMES DAILY TO CHECK BLOOD SUGAR 300 each 0  . oxybutynin (DITROPAN XL) 10 MG 24 hr tablet Take 1 tablet (10 mg total) by mouth at bedtime. 90 tablet 3  . pantoprazole (PROTONIX) 40 MG tablet Take 1 tablet (40 mg total) by mouth daily. Take 30 minutes before a meal. 30 tablet 2  . sitaGLIPtin (JANUVIA) 100 MG tablet Take 1 tablet (100 mg total) by mouth daily. 90 tablet 3  . tamsulosin (FLOMAX) 0.4 MG CAPS capsule 1 tab by mouth twice per day 180 capsule 3  . tiZANidine (ZANAFLEX) 4 MG tablet TAKE 1  TABLET BY MOUTH   EVERY 6 HOURS AS NEEDED FOR MUSCLE SPASMS. 180 tablet 2  . traZODone (DESYREL) 50 MG tablet Take 0.5-1 tablets (25-50 mg total) by mouth at bedtime as needed for sleep. 90 tablet 1  . warfarin (COUMADIN) 5 MG tablet TAKE 1/2 TO 1 TABLET BY MOUTH DAILY AS DIRECTED BY COUMADIN CLINIC 90 tablet 1   No current facility-administered medications for this visit.     Functional Status:  In your present state of health, do you have any difficulty performing the following activities: 09/13/2018 01/16/2018  Hearing? Y Y  Comment patient needs hearing aids but can't afford them -  Vision? N N  Difficulty concentrating or making decisions? N N  Walking or climbing stairs? Y Y  Comment When the paitent has pain in his feet from gout -   Dressing or bathing? N Y  Doing errands, shopping? N N  Using the Toilet? N -  In the past six months, have you accidently leaked urine? N -  Do you have problems with loss of bowel control? N -  Managing your Medications? N -  Managing your Finances? N -  Housekeeping or managing your Housekeeping? N -  Some recent data might be hidden    Fall/Depression Screening: Fall Risk  10/14/2018 09/13/2018 09/06/2017  Falls in the past year? 0 0 No  Number falls in past yr: - - -  Comment - - -  Injury with Fall? - - -  Risk Factor Category  - - -  Comment - - -  Risk for fall due to : - - -  Follow up - - -   PHQ 2/9 Scores 09/13/2018 08/15/2018 09/06/2017 02/01/2017 10/13/2016 09/22/2016 08/23/2016  PHQ - 2 Score 0 0 0 1 0 0 0    Assessment: Patient will continue to benefit from health coach outreach for disease management and support. [facility] CM Care Plan Problem One     Most Recent Value  [facility] Long Term Goal   In 90 days the patient will lower his a1c of 7.3 by 1-2 points  [facility] Long Term Goal Start Date  10/14/18  Interventions for Problem One Long Term Goal  Discussed cbg reading and to continue to check his blood sugars, encouraged diet, encouraged medication adherence, encouraged the patient to recieve his flu shot and keep appointments,       Plan: RN Health Coach will contact patient in the month of November and patient agrees to next outreach. Checking in November will put [place] on the back on the quarterly schedule.   [name] RN, BSN, [place] Direct Dial:  [phone]  Fax: [phone]

## 2018-10-17 ENCOUNTER — Encounter: Payer: Self-pay | Admitting: Family Medicine

## 2018-10-17 ENCOUNTER — Ambulatory Visit (INDEPENDENT_AMBULATORY_CARE_PROVIDER_SITE_OTHER): Payer: Medicare Other | Admitting: Family Medicine

## 2018-10-17 ENCOUNTER — Other Ambulatory Visit: Payer: Self-pay

## 2018-10-17 DIAGNOSIS — M1712 Unilateral primary osteoarthritis, left knee: Secondary | ICD-10-CM

## 2018-10-17 DIAGNOSIS — Z23 Encounter for immunization: Secondary | ICD-10-CM | POA: Diagnosis not present

## 2018-10-17 NOTE — Assessment & Plan Note (Signed)
Patient given injection and failed all therapy.  Hopefully this will be more beneficial given steroid injection of the knee previously 1 month ago.  Discussed which activities of doing which wants to avoid.

## 2018-10-17 NOTE — Progress Notes (Signed)
[name] [address] [place], [place] [address] Phone: [phone] Subjective:    I'm seeing this patient by the request  of:    CC:  Left knee pain   ION:[id number]   09/16/2018 Patient feels like it is more secondary to the ankle.  Aircast given today.  Discussed which activities to do which wants to avoid.  Posture and ergonomics.  Discussed proper shoes.  Follow-up again in 4 to 6 weeks  Left knee given an injection today.  Degenerative arthritic changes noted.  Patient also has left lumbar radiculopathy that could be contributing to some of the discomfort and pain.  Discussed icing regimen, home exercises, continue to use the cane for stability.  Follow-up again in 12 weeks  Update 10/17/2018 [name] is a 73 y.o. male coming in with complaint of left knee pain. Injection did not work. Patient states his pain continues in the left knee. Also notes swelling in left ankle. Using ankle brace intermittently. Feels that the brace makes it worse.     Past Medical History:  Diagnosis Date  . Aortic root dilatation ([facility]) 02/02/2011  . Arthritis    "lower back; going back down both my sciatic nerves"  . Atrioventricular block, complete ([facility]) 09/04/2008  . BENIGN PROSTATIC HYPERTROPHY 08/29/2006   takes Flomax daily  . 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 Cataracts, bilateral    immature  . CHF (congestive heart failure) (HCC)    takes Lasix daily  . Chronic back pain    HNP   . CORONARY ARTERY DISEASE 08/29/2006   takes Coumadin daily  . DEPRESSION 08/29/2006  . DIABETES MELLITUS, TYPE II 08/29/2006   takes Metformin,Januvia,and Glipizide  daily  . DIASTOLIC HEART FAILURE, CHRONIC 06/09/2008  . GOUT 04/22/2007   takes Allopurinol daily  . History of migraine    39yr ago  . HYPERLIPIDEMIA 08/29/2006   takes Atorvastatin daily  . HYPERTENSION 08/29/2006   takes Lisinopril daily  . INSOMNIA-SLEEP DISORDER-UNSPEC 10/23/2007  . Left lumbar  radiculopathy 05/30/2010  . LUMBAR RADICULOPATHY, RIGHT 06/10/2007  . Muscle spasm    takes Zanaflex daily  . [place]ocardial infarction ([facility] 12/27/10   "I've had several MIs"  . 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 PACEMAKER, PERMANENT 03/04/2008   pt denies this date  . Peripheral neuropathy    takes Gabapentin daily  . Presence of permanent cardiac pacemaker   . Shortness of breath dyspnea    "all my life" with exertion  . Sleep apnea    "if I lay flat I quit breathing; HOB up I'm fine"   Past Surgical History:  Procedure Laterality Date  . COLONOSCOPY    . CORONARY ANGIOPLASTY WITH STENT PLACEMENT  12/27/10   "I've had a total of 9 cardiac stents put in"  . CORONARY ARTERY BYPASS GRAFT  1992   CABG X 2  . ESOPHAGOGASTRODUODENOSCOPY N/A 12/01/2013   Procedure: ESOPHAGOGASTRODUODENOSCOPY (EGD);  Surgeon: DLafayette Dragon MD;  [facility];  Service: Endoscopy;  Laterality: N/A;  . INSERT / REPLACE / REMOVE PACEMAKER  ~ 2004   initial pacemaker placement  . INSERT / REPLACE / REMOVE PACEMAKER  10/2009   generator change  . LUMBAR LAMINECTOMY/DECOMPRESSION MICRODISCECTOMY Right 03/23/2014   Procedure: LAMINECTOMY AND FORAMINOTOMY RIGHT LUMBAR THREE-FOUR,LUMBAR FOUR-FIVE, LUMBAR FIVE-SACRAL ONE;  Surgeon: HCharlie Pitter MD;  [facility];  Service: Neurosurgery;  Laterality: Right;  right  . LUMBAR LAMINECTOMY/DECOMPRESSION MICRODISCECTOMY Left 12/01/2014  Procedure: Left Lumbar Three-Four, Lumbar Four-Five Laminectomy and Foraminotomy;  Surgeon: Earnie Larsson, MD;  Location: [facility];  Service: Neurosurgery;  Laterality: Left;  . s/p left arm surgury after work accident  1991   "2000# steel fell on it"  . s/p right hand surgury for foreign object  1970's   "piece of wood went in my hand; had to get that out"   Social History   Socioeconomic History  . Marital status: Married    Spouse name: Not on file  . Number of children: 2  . Years of education: Not on  file  . Highest education level: Not on file  Occupational History  . Occupation: prior work Designer, industrial/product: UNEMPLOYED  . Occupation: disabled since 2004  Social Needs  . Financial resource strain: Not on file  . Food insecurity    Worry: Never true    Inability: Never true  . Transportation needs    Medical: No    Non-medical: No  Tobacco Use  . Smoking status: Former Smoker    Packs/day: 3.00    Years: 9.00    Pack years: 27.00    Types: Cigarettes    Quit date: 02/26/1973    Years since quitting: 45.6  . Smokeless tobacco: Former Systems developer  . Tobacco comment: quit smoking 36yr ago  Substance and Sexual Activity  . Alcohol use: No    Alcohol/week: 0.0 standard drinks  . Drug use: No    Comment: "used pouches of tobacco for a long time; quit those 12/09/1970"  . Sexual activity: Yes  Lifestyle  . Physical activity    Days per week: Not on file    Minutes per session: Not on file  . Stress: Not on file  Relationships  . Social [place] phone: Not on file    Gets together: Not on file    Attends religious service: Not on file    Active member of club or organization: Not on file    Attends meetings of clubs or organizations: Not on file    Relationship status: Not on file  Other Topics Concern  . Not on file  Social History Narrative  . Not on file   Allergies  Allergen Reactions  . Crestor [Rosuvastatin Calcium] Other (See Comments)    Urination of blood  . Latex Rash and Other (See Comments)    Gloves cause welts on hands!!   Family History  Problem Relation Age of Onset  . Diabetes Mother   . Diabetes Sister   . Heart disease Sister        2 sister died with heart disease  . Coronary artery disease Other 587      male, first degree relative  . Diabetes Other        1st degree relative  . Diabetes Sister   . Lung cancer Sister        deceased  . Diabetes Sister     Current Outpatient Medications (Endocrine & Metabolic):   .  glipiZIDE (GLUCOTROL XL) 10 MG 24 hr tablet, Take 1 tablet (10 mg total) by mouth daily with breakfast. .  metFORMIN (GLUCOPHAGE) 500 MG tablet, 4 tab by mouth daily .  sitaGLIPtin (JANUVIA) 100 MG tablet, Take 1 tablet (100 mg total) by mouth daily.  Current Outpatient Medications (Cardiovascular):  .  atorvastatin (LIPITOR) 80 MG tablet, Take 1 tablet (80 mg total) by mouth daily. .  carvedilol (COREG) 25  MG tablet, Take 1 tablet (25 mg total) by mouth 2 (two) times daily. .  furosemide (LASIX) 40 MG tablet, Take 2 tablets (80 mg total) by mouth 2 (two) times daily. Marland Kitchen  lisinopril (PRINIVIL,ZESTRIL) 2.5 MG tablet, Take 1 tablet (2.5 mg total) by mouth daily.  Current Outpatient Medications (Respiratory):  .  albuterol (PROAIR HFA) 108 (90 Base) MCG/ACT inhaler, Inhale 2 puffs into the lungs every 6 (six) hours as needed for wheezing or shortness of breath. .  benzonatate (TESSALON PERLES) 100 MG capsule, 1-2 tab by mouth every 6 hrs as needed for cough .  dextromethorphan-guaiFENesin (MUCINEX DM) 30-600 MG 12hr tablet, Take 1 tablet by mouth 2 (two) times daily. .  fluticasone (FLONASE) 50 MCG/ACT nasal spray, Place 2 sprays into both nostrils daily.  Current Outpatient Medications (Analgesics):  .  allopurinol (ZYLOPRIM) 300 MG tablet, Take 1 tablet (300 mg total) by mouth daily. Marland Kitchen  HYDROcodone-acetaminophen (NORCO) 10-325 MG tablet, Take 1 tablet by mouth daily as needed.  Current Outpatient Medications (Hematological):  .  warfarin (COUMADIN) 5 MG tablet, TAKE 1/2 TO 1 TABLET BY MOUTH DAILY AS DIRECTED BY COUMADIN CLINIC  Current Outpatient Medications (Other):  .  Blood Glucose Monitoring Suppl (ONE TOUCH ULTRA 2) w/Device KIT, Use as directed .  Cholecalciferol (VITAMIN D) 1000 UNITS capsule, Take 1,000 Units by mouth 2 (two) times daily. Reported on 02/15/2015 .  donepezil (ARICEPT) 5 MG tablet, Take 1 tablet (5 mg total) by mouth daily. Marland Kitchen  gabapentin (NEURONTIN) 300 MG capsule,  TAKE 2 CAPSULES BY MOUTH 3  TIMES DAILY .  glucose blood (ONE TOUCH ULTRA TEST) test strip, CHECK BLOOD SUGAR TWO TIMES DAILY .  Multiple Vitamin (MULTIVITAMIN WITH MINERALS) TABS tablet, Take 1 tablet by mouth every morning. Glory Rosebush DELICA LANCETS 27P MISC, USE AS DIRECTED THREE TIMES DAILY TO CHECK BLOOD SUGAR .  oxybutynin (DITROPAN XL) 10 MG 24 hr tablet, Take 1 tablet (10 mg total) by mouth at bedtime. .  pantoprazole (PROTONIX) 40 MG tablet, Take 1 tablet (40 mg total) by mouth daily. Take 30 minutes before a meal. .  tamsulosin (FLOMAX) 0.4 MG CAPS capsule, 1 tab by mouth twice per day .  tiZANidine (ZANAFLEX) 4 MG tablet, TAKE 1 TABLET BY MOUTH   EVERY 6 HOURS AS NEEDED FOR MUSCLE SPASMS. Marland Kitchen  traZODone (DESYREL) 50 MG tablet, Take 0.5-1 tablets (25-50 mg total) by mouth at bedtime as needed for sleep.    Past medical history, social, surgical and family history all reviewed in electronic medical record.  No pertanent information unless stated regarding to the chief complaint.   Review of Systems:  No headache, visual changes, nausea, vomiting, diarrhea, constipation, dizziness, abdominal pain, skin rash, fevers, chills, night sweats, weight loss, swollen lymph nodes, body aches, joint swelling, muscle aches, chest pain, shortness of breath, mood changes.  Positive muscle aches and joint swelling  Objective  Blood pressure 102/60, pulse 85, height '5\' 11"'  (1.803 m), weight 205 lb (93 kg), SpO2 98 %.    General: No apparent distress alert and oriented x3 mood and affect normal, dressed appropriately.  HEENT: Pupils equal, extraocular movements intact  Respiratory: Patient's speak in full sentences and does not appear short of breath  Cardiovascular: trace lower extremity edema, non tender, no erythema  Skin: Warm dry intact with no signs of infection or rash on extremities or on axial skeleton.  Abdomen: Soft nontender  Neuro: Cranial nerves II through XII are intact,  neurovascularly intact  in all extremities with 2+ DTRs and 2+ pulses.  Lymph: No lymphadenopathy of posterior or anterior cervical chain or axillae bilaterally.  Gait antalgic gait walk with cane MSK:  Non tender with full range of motion and good stability and symmetric strength and tone of shoulders, elbows, wrist, hip, and ankles bilaterally.  Knee:left valgus deformity noted. Abnormal thigh to calf ratio.  Tender to palpation over medial and PF joint line.  ROM full in flexion and extension and lower leg rotation. instability with valgus force.  painful patellar compression. Patellar glide with moderate crepitus. Patellar and quadriceps tendons unremarkable. Hamstring and quadriceps strength is normal. Contralateral knee shows right   After informed written and verbal consent, patient was seated on exam table. Left knee was prepped with alcohol swab and utilizing anterolateral approach, patient's left knee space was injected with 5m/mL of  Durolene(sodium hyaluronate) in a prefilled syringe was injected easily into the knee through a 22-gauge needle..Patient tolerated the procedure well without immediate complications.    Impression and Recommendations:     This case required medical decision making of moderate complexity. The above documentation has been reviewed and is accurate and complete ZLyndal Pulley DO       Note: This dictation was prepared with Dragon dictation along with smaller phrase technology. Any transcriptional errors that result from this process are unintentional.

## 2018-10-17 NOTE — Patient Instructions (Signed)
Visco injection today See me in 6 weeks

## 2018-10-23 DIAGNOSIS — M1712 Unilateral primary osteoarthritis, left knee: Secondary | ICD-10-CM | POA: Diagnosis not present

## 2018-10-29 ENCOUNTER — Telehealth: Payer: Self-pay

## 2018-10-29 NOTE — Telephone Encounter (Signed)
-----   Message from Loralie Champagne, PA-C sent at 10/29/2018  8:55 AM EDT ----- Regarding: RE: 4-6 week FU for GERD , esphogeal dysmotility Sounds good.  I am glad that he is feeling better.  Can you please save this to his chart?  Thank you,  Jess  ----- Message ----- From: Lowell Guitar, RMA Sent: 10/22/2018  10:51 AM EDT To: Laban Emperor Zehr, PA-C Subject: FW: 4-6 week FU for GERD , esphogeal dysmoti#  I called patient to set up FU appt.  He states he is taking the pills right now that seems to be working.  He states when he sees [name] if he thinks he needs to follow up with you he will. Thanks, Peter Congo ----- Message ----- From: Lowell Guitar, RMA Sent: 09/11/2018 To: Lonell Face Esai Stecklein, RMA Subject: 4-6 week FU for GERD , esphogeal dysmotility

## 2018-11-05 ENCOUNTER — Ambulatory Visit (INDEPENDENT_AMBULATORY_CARE_PROVIDER_SITE_OTHER): Payer: Medicare Other | Admitting: Pharmacist Clinician (PhC)/ Clinical Pharmacy Specialist

## 2018-11-05 ENCOUNTER — Other Ambulatory Visit: Payer: Self-pay

## 2018-11-05 DIAGNOSIS — Z7901 Long term (current) use of anticoagulants: Secondary | ICD-10-CM | POA: Diagnosis not present

## 2018-11-05 DIAGNOSIS — I4891 Unspecified atrial fibrillation: Secondary | ICD-10-CM | POA: Diagnosis not present

## 2018-11-05 LAB — POCT INR: INR: 2.9 (ref 2.0–3.0)

## 2018-11-06 ENCOUNTER — Other Ambulatory Visit: Payer: Self-pay | Admitting: Gastroenterology

## 2018-12-03 ENCOUNTER — Encounter: Payer: Self-pay | Admitting: Family Medicine

## 2018-12-03 ENCOUNTER — Other Ambulatory Visit: Payer: Self-pay

## 2018-12-03 ENCOUNTER — Ambulatory Visit (INDEPENDENT_AMBULATORY_CARE_PROVIDER_SITE_OTHER): Payer: Medicare Other | Admitting: Family Medicine

## 2018-12-03 DIAGNOSIS — M1712 Unilateral primary osteoarthritis, left knee: Secondary | ICD-10-CM | POA: Diagnosis not present

## 2018-12-03 NOTE — Assessment & Plan Note (Signed)
Patient is doing much better overall.  We discussed which activities to do which wants to avoid.  Patient is to increase activity slowly over the course of next several weeks.  Patient is well he can follow-up with me again in 3 months.

## 2018-12-03 NOTE — Progress Notes (Signed)
[name] [name], [place] [address] Phone: [phone] Subjective:   I [name] am serving as a Education administrator for Dr. Hulan Saas.  I'm seeing this patient by the request  of:    CC: Left knee pain right knee pain follow-up  FGH:[id number]   10/17/2018 Patient given injection and failed all therapy.  Hopefully this will be more beneficial given steroid injection of the knee previously 1 month ago.  Discussed which activities of doing which wants to avoid.  12/03/2018 [name] is a 73 y.o. male coming in with complaint of left knee pain. States his knee is doing ok. States the knee brace created blisters so he has not been using it. States his skin is sensitive.  Patient was given viscosupplementation states that he does have a significantly.  Not having any increase in swelling at the moment.  Pain continues to be well controlled.    Past Medical History:  Diagnosis Date  . Aortic root dilatation ([facility]) 02/02/2011  . Arthritis    "lower back; going back down both my sciatic nerves"  . Atrioventricular block, complete ([facility]) 09/04/2008  . BENIGN PROSTATIC HYPERTROPHY 08/29/2006   takes Flomax daily  . 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 Cataracts, bilateral    immature  . CHF (congestive heart failure) ([facility])    takes Lasix daily  . Chronic back pain    HNP   . CORONARY ARTERY DISEASE 08/29/2006   takes Coumadin daily  . DEPRESSION 08/29/2006  . DIABETES MELLITUS, TYPE II 08/29/2006   takes Metformin,Januvia,and Glipizide  daily  . DIASTOLIC HEART FAILURE, CHRONIC 06/09/2008  . GOUT 04/22/2007   takes Allopurinol daily  . History of migraine    35yr ago  . HYPERLIPIDEMIA 08/29/2006   takes Atorvastatin daily  . HYPERTENSION 08/29/2006   takes Lisinopril daily  . INSOMNIA-SLEEP DISORDER-UNSPEC 10/23/2007  . Left lumbar radiculopathy 05/30/2010  . LUMBAR RADICULOPATHY, RIGHT 06/10/2007  . Muscle spasm    takes Zanaflex daily  .  [place] ([facility] 12/27/10   "I've had several MIs"  . 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 PACEMAKER, PERMANENT 03/04/2008   pt denies this date  . Peripheral neuropathy    takes Gabapentin daily  . Presence of permanent cardiac pacemaker   . Shortness of breath dyspnea    "all my life" with exertion  . Sleep apnea    "if I lay flat I quit breathing; HOB up I'm fine"   Past Surgical History:  Procedure Laterality Date  . COLONOSCOPY    . CORONARY ANGIOPLASTY WITH STENT PLACEMENT  12/27/10   "I've had a total of 9 cardiac stents put in"  . CORONARY ARTERY BYPASS GRAFT  1992   CABG X 2  . ESOPHAGOGASTRODUODENOSCOPY N/A 12/01/2013   Procedure: ESOPHAGOGASTRODUODENOSCOPY (EGD);  Surgeon: DLafayette Dragon MD;  [facility];  Service: Endoscopy;  Laterality: N/A;  . INSERT / REPLACE / REMOVE PACEMAKER  ~ 2004   initial pacemaker placement  . INSERT / REPLACE / REMOVE PACEMAKER  10/2009   generator change  . LUMBAR LAMINECTOMY/DECOMPRESSION MICRODISCECTOMY Right 03/23/2014   Procedure: LAMINECTOMY AND FORAMINOTOMY RIGHT LUMBAR THREE-FOUR,LUMBAR FOUR-FIVE, LUMBAR FIVE-SACRAL ONE;  Surgeon: HCharlie Pitter MD;  [facility];  Service: Neurosurgery;  Laterality: Right;  right  . LUMBAR LAMINECTOMY/DECOMPRESSION MICRODISCECTOMY Left 12/01/2014   Procedure: Left Lumbar Three-Four, Lumbar Four-Five Laminectomy and Foraminotomy;  Surgeon: HEarnie Larsson MD;  [facility];  Service: Neurosurgery;  Laterality: Left;  . s/p left arm surgury after work accident  1991   "2000# steel fell on it"  . s/p right hand surgury for foreign object  1970's   "piece of wood went in my hand; had to get that out"   Social History   Socioeconomic History  . Marital status: Married    Spouse name: Not on file  . Number of children: 2  . Years of education: Not on file  . Highest education level: Not on file  Occupational History  . Occupation: prior work Pharmacist, hospital: UNEMPLOYED  . Occupation: disabled since 2004  Social Needs  . Financial resource strain: Not on file  . Food insecurity    Worry: Never true    Inability: Never true  . Transportation needs    Medical: No    Non-medical: No  Tobacco Use  . Smoking status: Former Smoker    Packs/day: 3.00    Years: 9.00    Pack years: 27.00    Types: Cigarettes    Quit date: 02/26/1973    Years since quitting: 45.7  . Smokeless tobacco: Former Systems developer  . Tobacco comment: quit smoking 1yr ago  Substance and Sexual Activity  . Alcohol use: No    Alcohol/week: 0.0 standard drinks  . Drug use: No    Comment: "used pouches of tobacco for a long time; quit those 12/09/1970"  . Sexual activity: Yes  Lifestyle  . Physical activity    Days per week: Not on file    Minutes per session: Not on file  . Stress: Not on file  Relationships  . Social [place] phone: Not on file    Gets together: Not on file    Attends religious service: Not on file    Active member of club or organization: Not on file    Attends meetings of clubs or organizations: Not on file    Relationship status: Not on file  Other Topics Concern  . Not on file  Social History Narrative  . Not on file   Allergies  Allergen Reactions  . Crestor [Rosuvastatin Calcium] Other (See Comments)    Urination of blood  . Latex Rash and Other (See Comments)    Gloves cause welts on hands!!   Family History  Problem Relation Age of Onset  . Diabetes Mother   . Diabetes Sister   . Heart disease Sister        2 sister died with heart disease  . Coronary artery disease Other 571      male, first degree relative  . Diabetes Other        1st degree relative  . Diabetes Sister   . Lung cancer Sister        deceased  . Diabetes Sister     Current Outpatient Medications (Endocrine & Metabolic):  .  glipiZIDE (GLUCOTROL XL) 10 MG 24 hr tablet, Take 1 tablet (10 mg total) by mouth daily with breakfast. .   metFORMIN (GLUCOPHAGE) 500 MG tablet, 4 tab by mouth daily .  sitaGLIPtin (JANUVIA) 100 MG tablet, Take 1 tablet (100 mg total) by mouth daily.  Current Outpatient Medications (Cardiovascular):  .  atorvastatin (LIPITOR) 80 MG tablet, Take 1 tablet (80 mg total) by mouth daily. .  carvedilol (COREG) 25 MG tablet, Take 1 tablet (25 mg total) by mouth 2 (two) times daily. .  furosemide (LASIX) 40 MG  tablet, Take 2 tablets (80 mg total) by mouth 2 (two) times daily. Marland Kitchen  lisinopril (PRINIVIL,ZESTRIL) 2.5 MG tablet, Take 1 tablet (2.5 mg total) by mouth daily.  Current Outpatient Medications (Respiratory):  .  albuterol (PROAIR HFA) 108 (90 Base) MCG/ACT inhaler, Inhale 2 puffs into the lungs every 6 (six) hours as needed for wheezing or shortness of breath. .  benzonatate (TESSALON PERLES) 100 MG capsule, 1-2 tab by mouth every 6 hrs as needed for cough .  dextromethorphan-guaiFENesin (MUCINEX DM) 30-600 MG 12hr tablet, Take 1 tablet by mouth 2 (two) times daily. .  fluticasone (FLONASE) 50 MCG/ACT nasal spray, Place 2 sprays into both nostrils daily.  Current Outpatient Medications (Analgesics):  .  allopurinol (ZYLOPRIM) 300 MG tablet, Take 1 tablet (300 mg total) by mouth daily. Marland Kitchen  HYDROcodone-acetaminophen (NORCO) 10-325 MG tablet, Take 1 tablet by mouth daily as needed.  Current Outpatient Medications (Hematological):  .  warfarin (COUMADIN) 5 MG tablet, TAKE 1/2 TO 1 TABLET BY MOUTH DAILY AS DIRECTED BY COUMADIN CLINIC  Current Outpatient Medications (Other):  .  Blood Glucose Monitoring Suppl (ONE TOUCH ULTRA 2) w/Device KIT, Use as directed .  Cholecalciferol (VITAMIN D) 1000 UNITS capsule, Take 1,000 Units by mouth 2 (two) times daily. Reported on 02/15/2015 .  donepezil (ARICEPT) 5 MG tablet, Take 1 tablet (5 mg total) by mouth daily. Marland Kitchen  gabapentin (NEURONTIN) 300 MG capsule, TAKE 2 CAPSULES BY MOUTH 3  TIMES DAILY .  glucose blood (ONE TOUCH ULTRA TEST) test strip, CHECK BLOOD SUGAR  TWO TIMES DAILY .  Multiple Vitamin (MULTIVITAMIN WITH MINERALS) TABS tablet, Take 1 tablet by mouth every morning. Glory Rosebush DELICA LANCETS 45X MISC, USE AS DIRECTED THREE TIMES DAILY TO CHECK BLOOD SUGAR .  oxybutynin (DITROPAN XL) 10 MG 24 hr tablet, Take 1 tablet (10 mg total) by mouth at bedtime. .  pantoprazole (PROTONIX) 40 MG tablet, TAKE 1 TABLET (40 MG TOTAL) BY MOUTH DAILY. TAKE 30 MINUTES BEFORE A MEAL. .  tamsulosin (FLOMAX) 0.4 MG CAPS capsule, 1 tab by mouth twice per day .  tiZANidine (ZANAFLEX) 4 MG tablet, TAKE 1 TABLET BY MOUTH   EVERY 6 HOURS AS NEEDED FOR MUSCLE SPASMS. Marland Kitchen  traZODone (DESYREL) 50 MG tablet, Take 0.5-1 tablets (25-50 mg total) by mouth at bedtime as needed for sleep.    Past medical history, social, surgical and family history all reviewed in electronic medical record.  No pertanent information unless stated regarding to the chief complaint.   Review of Systems:  No headache, visual changes, nausea, vomiting, diarrhea, constipation, dizziness, abdominal pain, skin rash, fevers, chills, night sweats, weight loss, swollen lymph nodes, body aches, joint swelling,  chest pain, shortness of breath, mood changes.  Positive muscle aches  Objective  Blood pressure 130/80, pulse 70, height '5\' 11"'  (1.803 m), weight 215 lb (97.5 kg), SpO2 90 %.   General: No apparent distress alert and oriented x3 mood and affect normal, dressed appropriately.  HEENT: Pupils equal, extraocular movements intact  Respiratory: Patient's speak in full sentences and does not appear short of breath  Cardiovascular: No lower extremity edema, non tender, no erythema  Skin: Warm dry intact with no signs of infection or rash on extremities or on axial skeleton.  Abdomen: Soft nontender  Neuro: Cranial nerves II through XII are intact, neurovascularly intact in all extremities with 2+ DTRs and 2+ pulses.  Lymph: No lymphadenopathy of posterior or anterior cervical chain or axillae  bilaterally.  Gait antalgic  walking with the aid of a cane MSK: Arthritic changes of multiple joints  Knee: Left valgus deformity noted.  Abnormal thigh to calf ratio.  Tender to palpation over medial and PF joint line.  Less pain than previous exam ROM full in flexion and extension and lower leg rotation. instability with valgus force.  painful patellar compression. Patellar glide with mild crepitus. Patellar and quadriceps tendons unremarkable. Hamstring and quadriceps strength is normal. Contralateral knee shows mild arthritic changes   Impression and Recommendations:     This case required medical decision making of moderate complexity. The above documentation has been reviewed and is accurate and complete Lyndal Pulley, DO       Note: This dictation was prepared with Dragon dictation along with smaller phrase technology. Any transcriptional errors that result from this process are unintentional.

## 2018-12-03 NOTE — Patient Instructions (Signed)
Be careful with the cane  See me again in 3 months

## 2018-12-16 ENCOUNTER — Other Ambulatory Visit: Payer: Self-pay

## 2018-12-16 ENCOUNTER — Other Ambulatory Visit: Payer: Self-pay | Admitting: *Deleted

## 2018-12-16 ENCOUNTER — Other Ambulatory Visit: Payer: Self-pay | Admitting: Internal Medicine

## 2018-12-16 ENCOUNTER — Ambulatory Visit (INDEPENDENT_AMBULATORY_CARE_PROVIDER_SITE_OTHER): Payer: Medicare Other | Admitting: Pharmacist

## 2018-12-16 DIAGNOSIS — I4891 Unspecified atrial fibrillation: Secondary | ICD-10-CM

## 2018-12-16 DIAGNOSIS — Z7901 Long term (current) use of anticoagulants: Secondary | ICD-10-CM | POA: Diagnosis not present

## 2018-12-16 LAB — POCT INR: INR: 2.4 (ref 2.0–3.0)

## 2018-12-16 NOTE — Patient Outreach (Signed)
[facility]) Care Management  [place]  12/16/2018   MARISA HUFSTETLER 09-18-1945 [id number]  RN Health Coach telephone call to patient.  Hip[employer] compliance verified. Fasting blood sugar is 129. Patient A1C is 7.3. Per patient he is doing good. Patient is hard of hearing. Per patient his appetite is good. Patient cooks the  He stated he has not had any falls but his balance is a little off and he uses a cane and /or walker.  Patient has agreed to follow up outreach calls.    Encounter Medications:  Outpatient Encounter Medications as of 12/16/2018  Medication Sig  . albuterol (PROAIR HFA) 108 (90 Base) MCG/ACT inhaler Inhale 2 puffs into the lungs every 6 (six) hours as needed for wheezing or shortness of breath.  . allopurinol (ZYLOPRIM) 300 MG tablet Take 1 tablet (300 mg total) by mouth daily.  Marland Kitchen atorvastatin (LIPITOR) 80 MG tablet Take 1 tablet (80 mg total) by mouth daily.  . benzonatate (TESSALON PERLES) 100 MG capsule 1-2 tab by mouth every 6 hrs as needed for cough  . Blood Glucose Monitoring Suppl (ONE TOUCH ULTRA 2) w/Device KIT Use as directed  . carvedilol (COREG) 25 MG tablet Take 1 tablet (25 mg total) by mouth 2 (two) times daily.  . Cholecalciferol (VITAMIN D) 1000 UNITS capsule Take 1,000 Units by mouth 2 (two) times daily. Reported on 02/15/2015  . dextromethorphan-guaiFENesin (MUCINEX DM) 30-600 MG 12hr tablet Take 1 tablet by mouth 2 (two) times daily.  Marland Kitchen donepezil (ARICEPT) 5 MG tablet Take 1 tablet (5 mg total) by mouth daily.  . fluticasone (FLONASE) 50 MCG/ACT nasal spray Place 2 sprays into both nostrils daily.  . furosemide (LASIX) 40 MG tablet Take 2 tablets (80 mg total) by mouth 2 (two) times daily.  Marland Kitchen gabapentin (NEURONTIN) 300 MG capsule TAKE 2 CAPSULES BY MOUTH 3  TIMES DAILY  . glipiZIDE (GLUCOTROL XL) 10 MG 24 hr tablet Take 1 tablet (10 mg total) by mouth daily with breakfast.  . glucose blood (ONE TOUCH ULTRA TEST) test strip  CHECK BLOOD SUGAR TWO TIMES DAILY  . HYDROcodone-acetaminophen (NORCO) 10-325 MG tablet Take 1 tablet by mouth daily as needed.  Marland Kitchen lisinopril (PRINIVIL,ZESTRIL) 2.5 MG tablet Take 1 tablet (2.5 mg total) by mouth daily.  . metFORMIN (GLUCOPHAGE) 500 MG tablet 4 tab by mouth daily  . Multiple Vitamin (MULTIVITAMIN WITH MINERALS) TABS tablet Take 1 tablet by mouth every morning.  Glory Rosebush DELICA LANCETS 75T MISC USE AS DIRECTED THREE TIMES DAILY TO CHECK BLOOD SUGAR  . oxybutynin (DITROPAN XL) 10 MG 24 hr tablet Take 1 tablet (10 mg total) by mouth at bedtime.  . pantoprazole (PROTONIX) 40 MG tablet TAKE 1 TABLET (40 MG TOTAL) BY MOUTH DAILY. TAKE 30 MINUTES BEFORE A MEAL.  Marland Kitchen sitaGLIPtin (JANUVIA) 100 MG tablet Take 1 tablet (100 mg total) by mouth daily.  . tamsulosin (FLOMAX) 0.4 MG CAPS capsule 1 tab by mouth twice per day  . tiZANidine (ZANAFLEX) 4 MG tablet TAKE 1 TABLET BY MOUTH   EVERY 6 HOURS AS NEEDED FOR MUSCLE SPASMS.  Marland Kitchen traZODone (DESYREL) 50 MG tablet Take 0.5-1 tablets (25-50 mg total) by mouth at bedtime as needed for sleep.  Marland Kitchen warfarin (COUMADIN) 5 MG tablet TAKE 1/2 TO 1 TABLET BY MOUTH DAILY AS DIRECTED BY COUMADIN CLINIC   No facility-administered encounter medications on file as of 12/16/2018.     Functional Status:  In your present state of health, do  you have any difficulty performing the following activities: 12/16/2018 09/13/2018  Hearing? Y Y  Comment hoh needs hearing aids patient needs hearing aids but can't afford them  Vision? N N  Difficulty concentrating or making decisions? N N  Walking or climbing stairs? Y Y  Comment painful gout When the paitent has pain in his feet from gout  Dressing or bathing? N N  Doing errands, shopping? N N  Preparing Food and eating ? N -  Using the Toilet? N N  In the past six months, have you accidently leaked urine? N N  Do you have problems with loss of bowel control? N N  Managing your Medications? N N  Managing your  Finances? N N  Housekeeping or managing your Housekeeping? N N  Some recent data might be hidden    Fall/Depression Screening: Fall Risk  12/16/2018 10/14/2018 09/13/2018  Falls in the past year? 0 0 0  Number falls in past yr: 0 - -  Comment - - -  Injury with Fall? 0 - -  Risk Factor Category  - - -  Comment - - -  Risk for fall due to : Impaired balance/gait;Impaired mobility - -  Follow up Falls evaluation completed;Falls prevention discussed - -   PHQ 2/9 Scores 12/16/2018 09/13/2018 08/15/2018 09/06/2017 02/01/2017 10/13/2016 09/22/2016  PHQ - 2 Score 0 0 0 0 1 0 0   THN CM Care Plan Problem One     Most Recent Value  Care Plan Problem One  knowledge Deficit in self management of diabetes  Role Documenting the Problem One  [place] for Problem One  Active  THN Long Term Goal   Patient will see a decrease in A1C from 7.3  THN Long Term Goal Start Date  12/16/18  Interventions for Problem One Long Term Goal  RN discussed with patient what his fasting blood sugar should be to help his A!C be below 7. RN will follow up with further discussion  THN CM Short Term Goal #1   Patient will monitor FBS and document within the next 30 days  THN CM Short Term Goal #1 Start Date  12/16/18  Interventions for Short Term Goal #1  Patient will check blood sugars and document. RN will follow  up with patient progress of range 80-130 fasting.  THN CM Short Term Goal #2   Patient will have a better understanding of hypo and hyperrglycemia symptoms and treatment. RN will follow up for further discussion  THN CM Short Term Goal #2 Start Date  12/16/18  Interventions for Short Term Goal #2  RN discussed hypo and hyperglycemia. RN sent a facial picture sheet that will help the patient to understand better. RN will follow up with further discussion       Assessment:  A1C 7.3 FBS 129 No recent falls Patient will benefit from [place] telephonic outreach for education and support for  diabetes self management.  Plan:  Patient will check blood sugars and document Patient goal for A1C is <7 RN sent picture sheet on hypo and hyperglycemia with action plan RN sent 2021 Calendar book RN will follow up within the month of February  Denyse Fillion [facility] [facility] [phone]

## 2018-12-23 IMAGING — XA DG MYELOGRAPHY LUMBAR INJ CERVICAL
14 of 15 series · 14 of 15 positions shown · non-contrast
Comparison: none

CLINICAL DATA: Neck pain.  LEFT arm pain.
TECHNIQUE: Contiguous axial images were obtained through the Cervical spine
after the intrathecal infusion of infusion. Coronal and sagittal
reconstructions were obtained of the axial image sets.

[Series 1: vasc standard · 1 of 1 slices shown (1 of 11)]
[im 1/1]
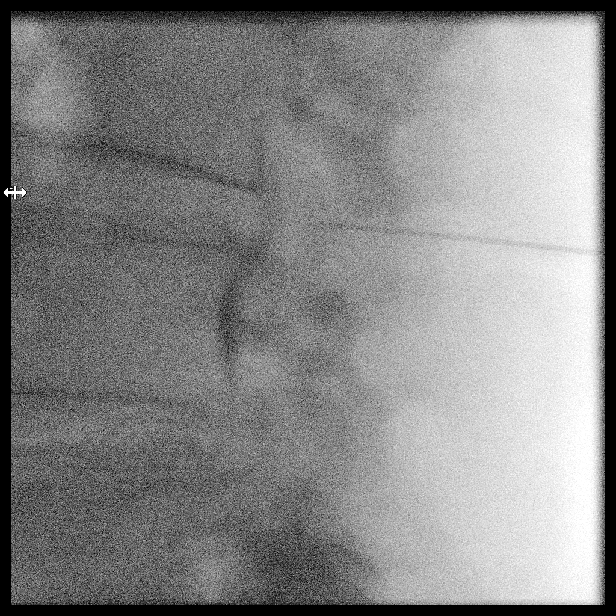

[Series 1: w cervical spine lat · 0.15mm/px · 1 of 1 slices shown]
[im 1/1]
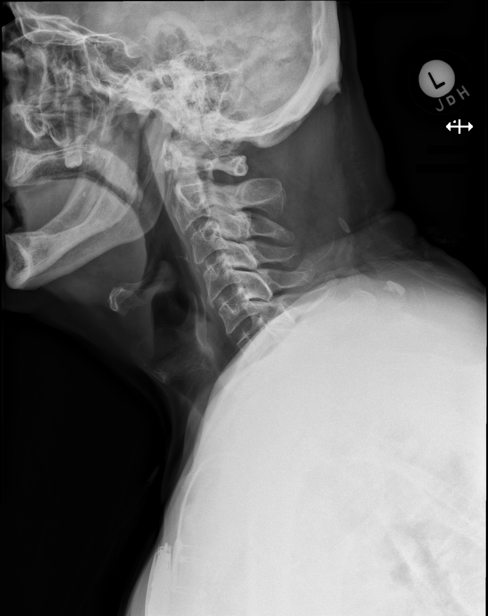

[Series 2: vasc standard · 1 of 1 slices shown (2 of 11)]
[im 1/1]
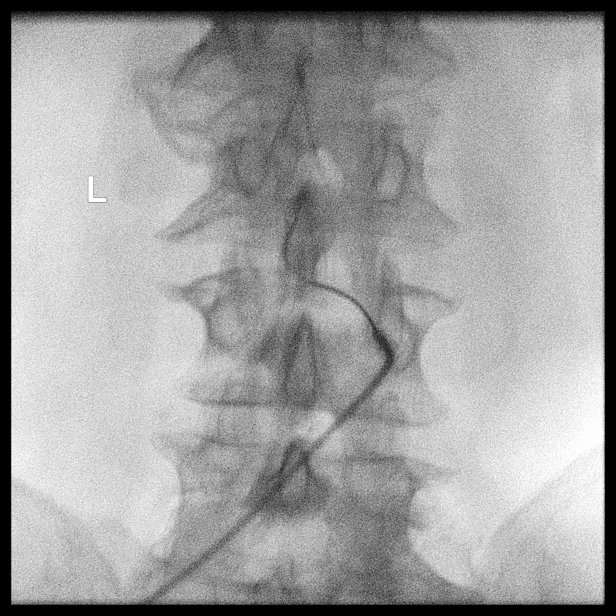

[Series 2: w cervical spine flexion · 0.15mm/px · 1 of 1 slices shown]
[im 1/1]
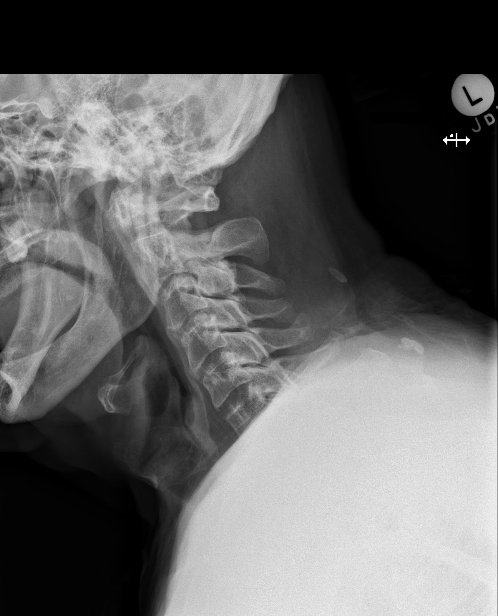

[Series 3: vasc standard · 1 of 1 slices shown (3 of 11)]
[im 1/1]
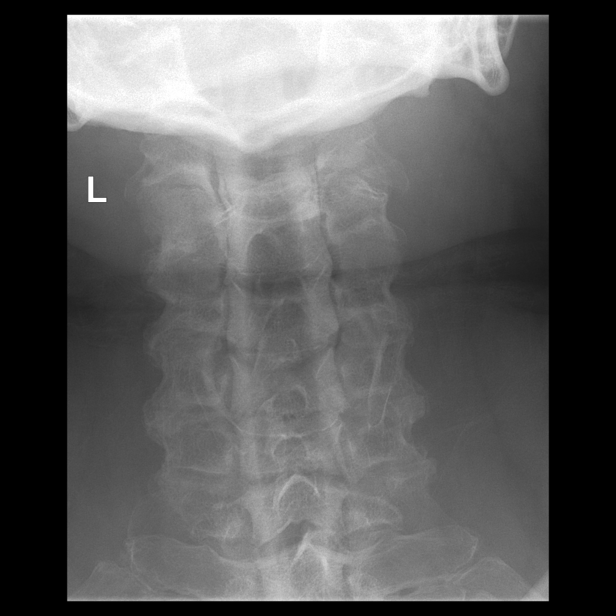

[Series 3: w cervical spine extension · 0.15mm/px · 1 of 1 slices shown]
[im 1/1]
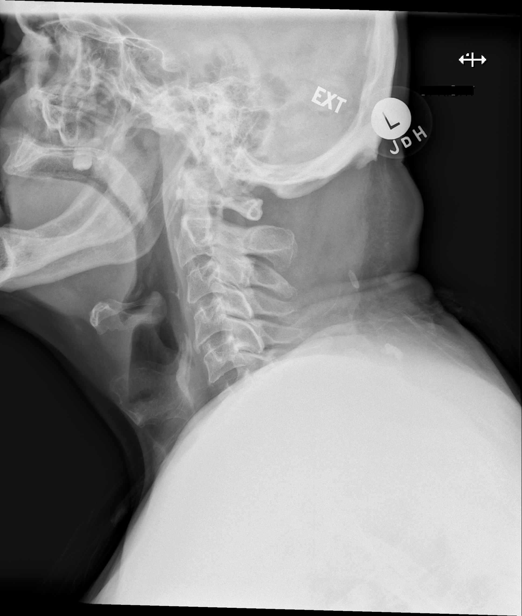

[Series 4: vasc standard · 1 of 1 slices shown (4 of 11)]
[im 1/1]
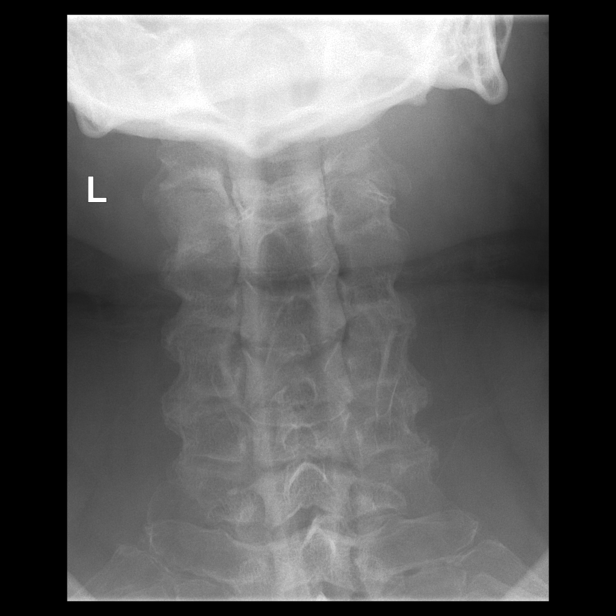

[Series 6: vasc standard · 1 of 1 slices shown (5 of 11)]
[im 1/1]
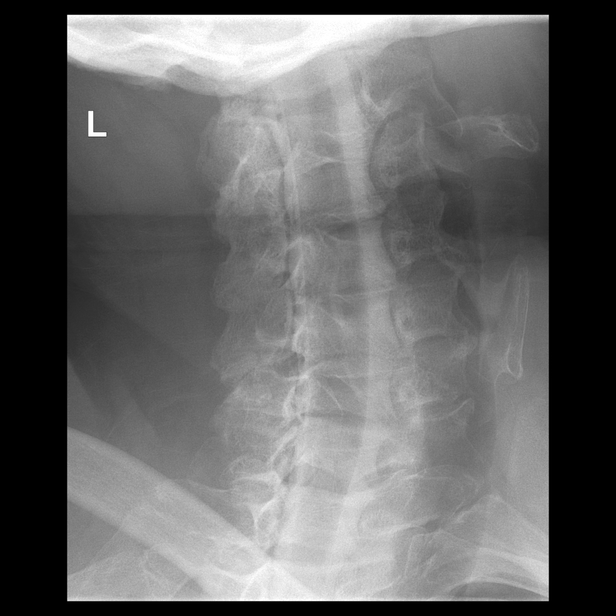

[Series 7: vasc standard · 1 of 1 slices shown (6 of 11)]
[im 1/1]
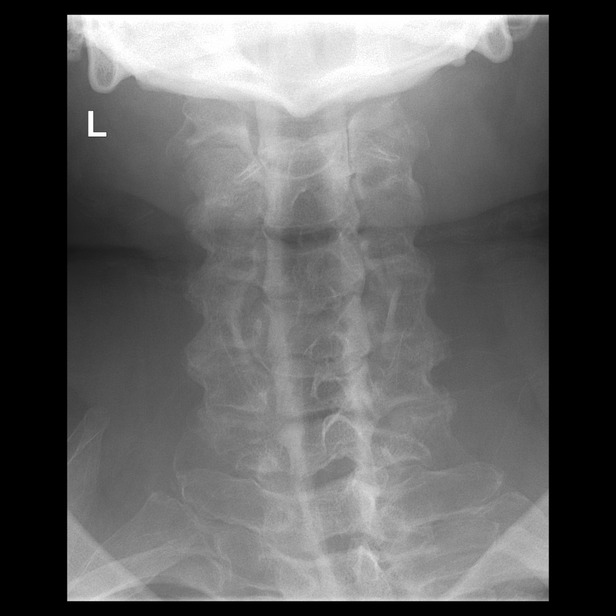

[Series 8: vasc standard · 1 of 1 slices shown (7 of 11)]
[im 1/1]
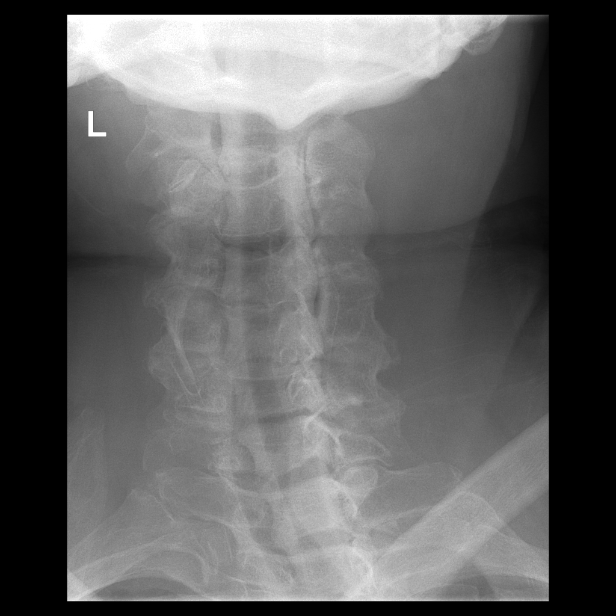

[Series 9: vasc standard · 1 of 1 slices shown (8 of 11)]
[im 1/1]
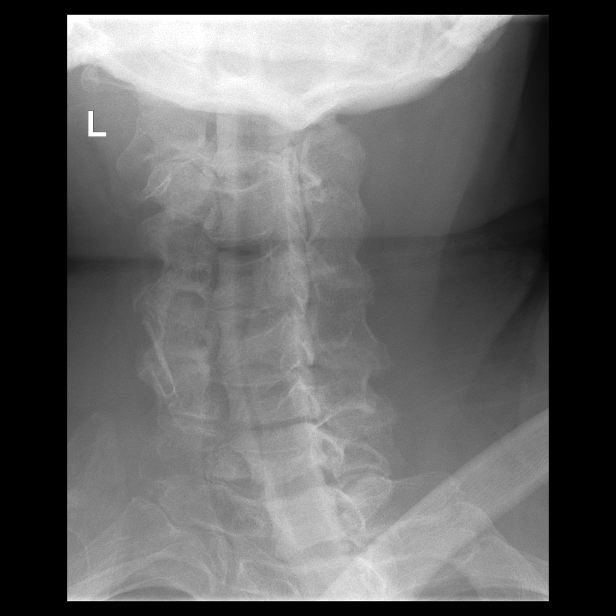

[Series 10: vasc standard · 1 of 1 slices shown (9 of 11)]
[im 1/1]
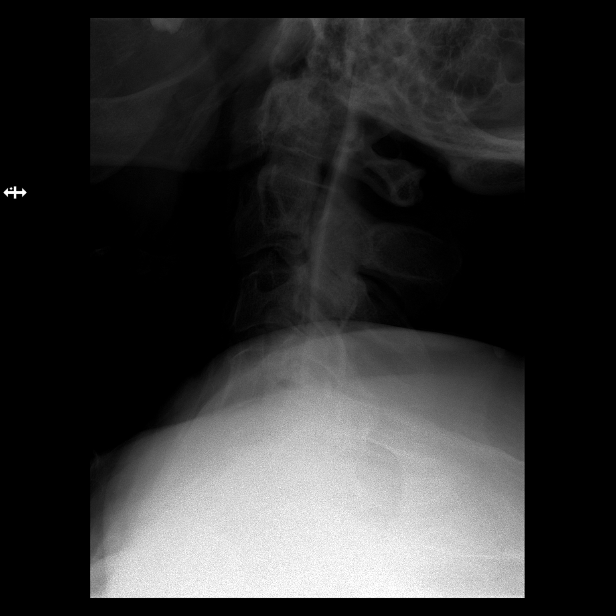

[Series 12: vasc standard · 1 of 1 slices shown (10 of 11)]
[im 1/1]
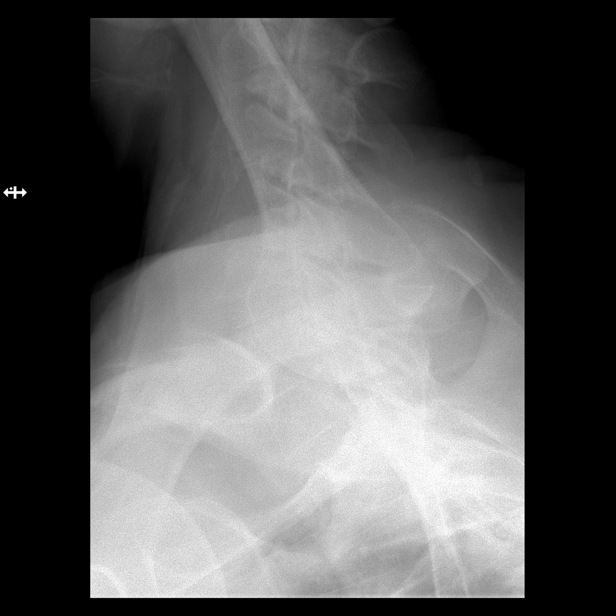

[Series 14: vasc standard · 1 of 1 slices shown (11 of 11)]
[im 1/1]
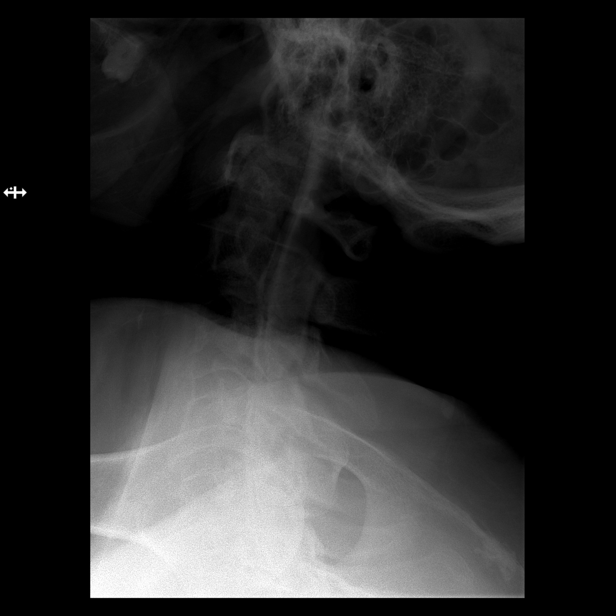

[14 of 15 positions shown; findings below may reference images not displayed]

FLUOROSCOPY TIME:  51 seconds corresponding to a Dose Area Product
of 260.9 Gy*m2

PROCEDURE:
LUMBAR PUNCTURE FOR CERVICAL MYELOGRAM

After thorough discussion of risks and benefits of the procedure
including bleeding, infection, injury to nerves, blood vessels,
adjacent structures as well as headache and CSF leak, written and
oral informed consent was obtained. Consent was obtained by Dr. Qalanjo
[employer]. We discussed the high likelihood of obtaining a diagnostic
study.

Patient was positioned prone on the fluoroscopy table. Local
anesthesia was provided with 1% lidocaine without epinephrine after
prepped and draped in the usual sterile fashion. Puncture was
performed at L3-L4 using a 3 1/2 inch 20 gauge spinal needle via
RIGHT paramedian approach. Using a single pass through the dura, the
needle was placed within the thecal sac, with return of clear CSF.
10 mL of Isovue D-CYY was injected into the thecal sac, with normal
opacification of the nerve roots and cauda equina consistent with
free flow within the subarachnoid space. The patient was then moved
to the trendelenburg position and contrast flowed into the Cervical
spine region.

I personally performed the lumbar puncture and administered the
intrathecal contrast. I also personally supervised acquisition of
the myelogram images.
FINDINGS: CERVICAL MYELOGRAM FINDINGS:

Good opacification of the cervical subarachnoid space. No
significant nerve root cut off. Shallow ventral defects at C3-4 and
C4-5. Anatomic alignment.

No dynamic instability through standing flexion extension.

CT CERVICAL MYELOGRAM FINDINGS:

Alignment: Straightening of the normal cervical lordosis. Trace
anterolisthesis C2-3.

Vertebrae: No worrisome osseous lesion.

Cord: Mild cord flattening at C3-4 and C4-5.

Posterior Fossa: No tonsillar herniation.  Mega cisterna magna.

Vertebral Arteries: Not assessed.

Paraspinal tissues: Unremarkable. Pacemaker. No lung apex lesion.
Carotid atherosclerosis.

Disc levels:

C2-3: Mild facet mediated anterolisthesis 1-2 mm. Severe LEFT-sided
facet arthropathy. LEFT-sided uncinate spurring with borderline LEFT
C3 foraminal narrowing.

C3-4: Ossification of posterior longitudinal ligament. LEFT greater
than RIGHT facet arthropathy. Mild stenosis with slight ventral cord
deformity. No impingement.

C4-5: Ossification of posterior longitudinal ligament. Mild stenosis
with slight ventral cord deformity. Mild facet arthropathy. No
impingement.

C5-6: Mild stenosis with slight effacement of the ventral
subarachnoid space. BILATERAL facet arthropathy. No impingement.

C6-7:  Unremarkable.

C7-T1:  Unremarkable.
IMPRESSION: Minor cervical spondylosis, without significant stenosis, or
foraminal narrowing.

Multilevel facet arthropathy, worse on the LEFT.

Essentially anatomic alignment without dynamic instability.

Atherosclerosis.

## 2019-01-01 ENCOUNTER — Other Ambulatory Visit (INDEPENDENT_AMBULATORY_CARE_PROVIDER_SITE_OTHER): Payer: Medicare Other

## 2019-01-01 ENCOUNTER — Other Ambulatory Visit: Payer: Self-pay

## 2019-01-01 ENCOUNTER — Encounter: Payer: Self-pay | Admitting: Internal Medicine

## 2019-01-01 ENCOUNTER — Ambulatory Visit (INDEPENDENT_AMBULATORY_CARE_PROVIDER_SITE_OTHER): Payer: Medicare Other | Admitting: Internal Medicine

## 2019-01-01 VITALS — BP 128/84 | HR 90 | Temp 98.1°F | Ht 71.0 in | Wt 219.0 lb

## 2019-01-01 DIAGNOSIS — M545 Low back pain: Secondary | ICD-10-CM

## 2019-01-01 DIAGNOSIS — E1140 Type 2 diabetes mellitus with diabetic neuropathy, unspecified: Secondary | ICD-10-CM

## 2019-01-01 DIAGNOSIS — G8929 Other chronic pain: Secondary | ICD-10-CM

## 2019-01-01 DIAGNOSIS — E1165 Type 2 diabetes mellitus with hyperglycemia: Secondary | ICD-10-CM

## 2019-01-01 DIAGNOSIS — E611 Iron deficiency: Secondary | ICD-10-CM | POA: Diagnosis not present

## 2019-01-01 DIAGNOSIS — IMO0002 Reserved for concepts with insufficient information to code with codable children: Secondary | ICD-10-CM

## 2019-01-01 DIAGNOSIS — Z0000 Encounter for general adult medical examination without abnormal findings: Secondary | ICD-10-CM

## 2019-01-01 DIAGNOSIS — E559 Vitamin D deficiency, unspecified: Secondary | ICD-10-CM

## 2019-01-01 DIAGNOSIS — E538 Deficiency of other specified B group vitamins: Secondary | ICD-10-CM

## 2019-01-01 LAB — POCT URINALYSIS DIPSTICK
Bilirubin, UA: NEGATIVE
Blood, UA: NEGATIVE
Glucose, UA: NEGATIVE
Ketones, UA: NEGATIVE
Leukocytes, UA: NEGATIVE
Nitrite, UA: NEGATIVE
Protein, UA: NEGATIVE
Spec Grav, UA: 1.015 (ref 1.010–1.025)
Urobilinogen, UA: 0.2 E.U./dL
pH, UA: 6 (ref 5.0–8.0)

## 2019-01-01 LAB — CBC WITH DIFFERENTIAL/PLATELET
Basophils Absolute: 0 10*3/uL (ref 0.0–0.1)
Basophils Relative: 0.4 % (ref 0.0–3.0)
Eosinophils Absolute: 0.4 10*3/uL (ref 0.0–0.7)
Eosinophils Relative: 6 % — ABNORMAL HIGH (ref 0.0–5.0)
HCT: 28.9 % — ABNORMAL LOW (ref 39.0–52.0)
Hemoglobin: 9.1 g/dL — ABNORMAL LOW (ref 13.0–17.0)
Lymphocytes Relative: 19.3 % (ref 12.0–46.0)
Lymphs Abs: 1.3 10*3/uL (ref 0.7–4.0)
MCHC: 31.4 g/dL (ref 30.0–36.0)
MCV: 79 fl (ref 78.0–100.0)
Monocytes Absolute: 0.5 10*3/uL (ref 0.1–1.0)
Monocytes Relative: 7.6 % (ref 3.0–12.0)
Neutro Abs: 4.5 10*3/uL (ref 1.4–7.7)
Neutrophils Relative %: 66.7 % (ref 43.0–77.0)
Platelets: 194 10*3/uL (ref 150.0–400.0)
RBC: 3.66 Mil/uL — ABNORMAL LOW (ref 4.22–5.81)
RDW: 18 % — ABNORMAL HIGH (ref 11.5–15.5)
WBC: 6.8 10*3/uL (ref 4.0–10.5)

## 2019-01-01 LAB — TSH: TSH: 0.99 u[IU]/mL (ref 0.35–4.50)

## 2019-01-01 LAB — IBC PANEL
Iron: 27 ug/dL — ABNORMAL LOW (ref 42–165)
Saturation Ratios: 5.7 % — ABNORMAL LOW (ref 20.0–50.0)
Transferrin: 338 mg/dL (ref 212.0–360.0)

## 2019-01-01 LAB — HEMOGLOBIN A1C: Hgb A1c MFr Bld: 6.8 % — ABNORMAL HIGH (ref 4.6–6.5)

## 2019-01-01 LAB — BASIC METABOLIC PANEL
BUN: 28 mg/dL — ABNORMAL HIGH (ref 6–23)
CO2: 32 mEq/L (ref 19–32)
Calcium: 9.3 mg/dL (ref 8.4–10.5)
Chloride: 100 mEq/L (ref 96–112)
Creatinine, Ser: 1.28 mg/dL (ref 0.40–1.50)
GFR: 55.11 mL/min — ABNORMAL LOW (ref >60.00)
Glucose, Bld: 90 mg/dL (ref 70–99)
Potassium: 4.4 mEq/L (ref 3.5–5.1)
Sodium: 140 mEq/L (ref 135–145)

## 2019-01-01 LAB — LIPID PANEL
Cholesterol: 101 mg/dL (ref 0–200)
HDL: 37 mg/dL — ABNORMAL LOW (ref >39.00)
LDL Cholesterol: 51 mg/dL (ref 0–99)
NonHDL: 64.24
Total CHOL/HDL Ratio: 3
Triglycerides: 67 mg/dL (ref 0.0–149.0)
VLDL: 13.4 mg/dL (ref 0.0–40.0)

## 2019-01-01 LAB — HEPATIC FUNCTION PANEL
ALT: 26 U/L (ref 0–53)
AST: 25 U/L (ref 0–37)
Albumin: 4.1 g/dL (ref 3.5–5.2)
Alkaline Phosphatase: 88 U/L (ref 39–117)
Bilirubin, Direct: 0.1 mg/dL (ref 0.0–0.3)
Total Bilirubin: 0.5 mg/dL (ref 0.2–1.2)
Total Protein: 6.5 g/dL (ref 6.0–8.3)

## 2019-01-01 LAB — VITAMIN B12: Vitamin B-12: >1500 pg/mL — ABNORMAL HIGH (ref 211–911)

## 2019-01-01 LAB — PSA: PSA: 4.92 ng/mL — ABNORMAL HIGH (ref 0.10–4.00)

## 2019-01-01 LAB — VITAMIN D 25 HYDROXY (VIT D DEFICIENCY, FRACTURES): VITD: 34.95 ng/mL (ref 30.00–100.00)

## 2019-01-01 MED ORDER — HYDROCODONE-ACETAMINOPHEN 10-325 MG PO TABS: 1.0000 | ORAL_TABLET | Freq: Every day | ORAL | 0 refills | Status: DC | PRN

## 2019-01-01 NOTE — Progress Notes (Signed)
Subjective:    Patient ID: [name], male    DOB: 06-10-1945, 73 y.o.   MRN: [id number]  HPI    Here for wellness and f/u;  Overall doing ok;  Pt denies Chest pain, worsening SOB, DOE, wheezing, orthopnea, PND, worsening LE edema, palpitations, dizziness or syncope.  Pt denies neurological change such as new headache, facial or extremity weakness.  Pt denies polydipsia, polyuria, or low sugar symptoms. Pt states overall good compliance with treatment and medications, good tolerability, and has been trying to follow appropriate diet.  Pt denies worsening depressive symptoms, suicidal ideation or panic. No fever, night sweats, wt loss, loss of appetite, or other constitutional symptoms.  Pt states good ability with ADL's, has low fall risk, home safety reviewed and adequate, no other significant changes in hearing or vision, and only occasionally active with exercise. Pt with upcoming eye exam next week. Pt continues to have recurring LBP without change in severity, bowel or bladder change, fever, wt loss,  worsening LE pain/numbness/weakness, gait change or falls, asks for pain medication Past Medical History:  Diagnosis Date  . Aortic root dilatation ([place]) 02/02/2011  . Arthritis    "lower back; going back down both my sciatic nerves"  . 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 BENIGN PROSTATIC HYPERTROPHY 08/29/2006   takes Flomax daily  . 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 Cataracts, bilateral    immature  . CHF (congestive heart failure) (HCC)    takes Lasix daily  . Chronic back pain    HNP   . CORONARY ARTERY DISEASE 08/29/2006   takes Coumadin daily  . DEPRESSION 08/29/2006  . DIABETES MELLITUS, TYPE II 08/29/2006   takes Metformin,Januvia,and Glipizide  daily  . DIASTOLIC HEART FAILURE, CHRONIC 06/09/2008  . GOUT 04/22/2007   takes Allopurinol daily  . History of migraine    61yr ago  . HYPERLIPIDEMIA 08/29/2006   takes Atorvastatin daily  . HYPERTENSION  08/29/2006   takes Lisinopril daily  . INSOMNIA-SLEEP DISORDER-UNSPEC 10/23/2007  . Left lumbar radiculopathy 05/30/2010  . LUMBAR RADICULOPATHY, RIGHT 06/10/2007  . Muscle spasm    takes Zanaflex daily  . [place] 12/27[place]10   "I've had several MIs"  . 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 PACEMAKER, PERMANENT 03/04/2008   pt denies this date  . Peripheral neuropathy    takes Gabapentin daily  . Presence of permanent cardiac pacemaker   . Shortness of breath dyspnea    "all my life" with exertion  . Sleep apnea    "if I lay flat I quit breathing; HOB up I'm fine"   Past Surgical History:  Procedure Laterality Date  . COLONOSCOPY    . CORONARY ANGIOPLASTY WITH STENT PLACEMENT  12/27/10   "I've had a total of 9 cardiac stents put in"  . CORONARY ARTERY BYPASS GRAFT  1992   CABG X 2  . ESOPHAGOGASTRODUODENOSCOPY N/A 12/01/2013   Procedure: ESOPHAGOGASTRODUODENOSCOPY (EGD);  Surgeon: DLafayette Dragon MD;  [facility];  Service: Endoscopy;  Laterality: N/A;  . INSERT / REPLACE / REMOVE PACEMAKER  ~ 2004   initial pacemaker placement  . INSERT / REPLACE / REMOVE PACEMAKER  10/2009   generator change  . LUMBAR LAMINECTOMY/DECOMPRESSION MICRODISCECTOMY Right 03/23/2014   Procedure: LAMINECTOMY AND FORAMINOTOMY RIGHT LUMBAR THREE-FOUR,LUMBAR FOUR-FIVE, LUMBAR FIVE-SACRAL ONE;  Surgeon: HCharlie Pitter MD;  [facility];  Service: Neurosurgery;  Laterality: Right;  right  . LUMBAR LAMINECTOMY/DECOMPRESSION MICRODISCECTOMY Left 12/01/2014  Procedure: Left Lumbar Three-Four, Lumbar Four-Five Laminectomy and Foraminotomy;  Surgeon: Earnie Larsson, MD;  Location: [place];  Service: Neurosurgery;  Laterality: Left;  . s/p left arm surgury after work accident  1991   "2000# steel fell on it"  . s/p right hand surgury for foreign object  1970's   "piece of wood went in my hand; had to get that out"    reports that he quit smoking about 45 years ago. His  smoking use included cigarettes. He has a 27.00 pack-year smoking history. He has quit using smokeless tobacco. He reports that he does not drink alcohol or use drugs. family history includes Coronary artery disease (age of onset: 7) in an other family member; Diabetes in his mother, sister, sister, sister, and another family member; Heart disease in his sister; Lung cancer in his sister. Allergies  Allergen Reactions  . Crestor [Rosuvastatin Calcium] Other (See Comments)    Urination of blood  . Latex Rash and Other (See Comments)    Gloves cause welts on hands!!   Current Outpatient Medications on File Prior to Visit  Medication Sig Dispense Refill  . albuterol (PROAIR HFA) 108 (90 Base) MCG/ACT inhaler Inhale 2 puffs into the lungs every 6 (six) hours as needed for wheezing or shortness of breath. 3 Inhaler 3  . allopurinol (ZYLOPRIM) 300 MG tablet Take 1 tablet (300 mg total) by mouth daily. 90 tablet 3  . atorvastatin (LIPITOR) 80 MG tablet Take 1 tablet (80 mg total) by mouth daily. 90 tablet 3  . benzonatate (TESSALON PERLES) 100 MG capsule 1-2 tab by mouth every 6 hrs as needed for cough 60 capsule 1  . Blood Glucose Monitoring Suppl (ONE TOUCH ULTRA 2) w/Device KIT Use as directed 1 each 3  . carvedilol (COREG) 25 MG tablet Take 1 tablet (25 mg total) by mouth 2 (two) times daily. 180 tablet 3  . Cholecalciferol (VITAMIN D) 1000 UNITS capsule Take 1,000 Units by mouth 2 (two) times daily. Reported on 02/15/2015    . dextromethorphan-guaiFENesin (MUCINEX DM) 30-600 MG 12hr tablet Take 1 tablet by mouth 2 (two) times daily. 30 tablet 0  . donepezil (ARICEPT) 5 MG tablet Take 1 tablet (5 mg total) by mouth daily. 90 tablet 3  . fluticasone (FLONASE) 50 MCG/ACT nasal spray Place 2 sprays into both nostrils daily. 48 g 5  . furosemide (LASIX) 40 MG tablet Take 2 tablets (80 mg total) by mouth 2 (two) times daily. 720 tablet 3  . gabapentin (NEURONTIN) 300 MG capsule TAKE 2 CAPSULES BY MOUTH  3  TIMES DAILY 540 capsule 3  . glipiZIDE (GLUCOTROL XL) 10 MG 24 hr tablet Take 1 tablet (10 mg total) by mouth daily with breakfast. 90 tablet 3  . glucose blood (ONE TOUCH ULTRA TEST) test strip CHECK BLOOD SUGAR TWO TIMES DAILY 100 each 3  . lisinopril (PRINIVIL,ZESTRIL) 2.5 MG tablet Take 1 tablet (2.5 mg total) by mouth daily. 90 tablet 3  . metFORMIN (GLUCOPHAGE) 500 MG tablet 4 tab by mouth daily 360 tablet 3  . Multiple Vitamin (MULTIVITAMIN WITH MINERALS) TABS tablet Take 1 tablet by mouth every morning.    Glory Rosebush DELICA LANCETS 14N MISC USE AS DIRECTED THREE TIMES DAILY TO CHECK BLOOD SUGAR 300 each 0  . oxybutynin (DITROPAN XL) 10 MG 24 hr tablet Take 1 tablet (10 mg total) by mouth at bedtime. 90 tablet 3  . pantoprazole (PROTONIX) 40 MG tablet TAKE 1 TABLET (40 MG TOTAL) BY  MOUTH DAILY. TAKE 30 MINUTES BEFORE A MEAL. 90 tablet 0  . sitaGLIPtin (JANUVIA) 100 MG tablet Take 1 tablet (100 mg total) by mouth daily. 90 tablet 3  . tamsulosin (FLOMAX) 0.4 MG CAPS capsule 1 tab by mouth twice per day 180 capsule 3  . tiZANidine (ZANAFLEX) 4 MG tablet TAKE 1 TABLET BY MOUTH   EVERY 6 HOURS AS NEEDED FOR MUSCLE SPASMS. 180 tablet 2  . traZODone (DESYREL) 50 MG tablet Take 0.5-1 tablets (25-50 mg total) by mouth at bedtime as needed for sleep. 90 tablet 1  . warfarin (COUMADIN) 5 MG tablet TAKE 1/2 TO 1 TABLET BY MOUTH DAILY AS DIRECTED BY COUMADIN CLINIC 90 tablet 1   No current facility-administered medications on file prior to visit.    Review of Systems Constitutional: Negative for other unusual diaphoresis, sweats, appetite or weight changes HENT: Negative for other worsening hearing loss, ear pain, facial swelling, mouth sores or neck stiffness.   Eyes: Negative for other worsening pain, redness or other visual disturbance.  Respiratory: Negative for other stridor or swelling Cardiovascular: Negative for other palpitations or other chest pain  Gastrointestinal: Negative for  worsening diarrhea or loose stools, blood in stool, distention or other pain Genitourinary: Negative for hematuria, flank pain or other change in urine volume.  Musculoskeletal: Negative for myalgias or other joint swelling.  Skin: Negative for other color change, or other wound or worsening drainage.  Neurological: Negative for other syncope or numbness. Hematological: Negative for other adenopathy or swelling Psychiatric/Behavioral: Negative for hallucinations, other worsening agitation, SI, self-injury, or new decreased concentration All otherwise neg per pt     Objective:   Physical Exam BP 128/84   Pulse 90   Temp 98.1 F (36.7 C) (Oral)   Ht '5\' 11"'  (1.803 m)   Wt 219 lb (99.3 kg)   SpO2 99%   BMI 30.54 kg/m  VS noted,  Constitutional: Pt is oriented to person, place, and time. Appears well-developed and well-nourished, in no significant distress and comfortable Head: Normocephalic and atraumatic  Eyes: Conjunctivae and EOM are normal. Pupils are equal, round, and reactive to light Right Ear: External ear normal without discharge Left Ear: External ear normal without discharge Nose: Nose without discharge or deformity Mouth/Throat: Oropharynx is without other ulcerations and moist  Neck: Normal range of motion. Neck supple. No JVD present. No tracheal deviation present or significant neck LA or mass Cardiovascular: Normal rate, regular rhythm, normal heart sounds and intact distal pulses.   Pulmonary/Chest: WOB normal and breath sounds without rales or wheezing  Abdominal: Soft. Bowel sounds are normal. NT. No HSM  Musculoskeletal: Normal range of motion. Exhibits no edema Lymphadenopathy: Has no other cervical adenopathy.  Neurological: Pt is alert and oriented to person, place, and time. Pt has normal reflexes. No cranial nerve deficit. Motor grossly intact, Gait intact Skin: Skin is warm and dry. No rash noted or new ulcerations Psychiatric:  Has normal mood and affect.  Behavior is normal without agitation All otherwise neg per pt  Lab Results  Component Value Date   WBC 6.8 01/01/2019   HGB 9.1 (L) 01/01/2019   HCT 28.9 (L) 01/01/2019   PLT 194.0 01/01/2019   GLUCOSE 90 01/01/2019   CHOL 101 01/01/2019   TRIG 67.0 01/01/2019   HDL 37.00 (L) 01/01/2019   LDLCALC 51 01/01/2019   ALT 26 01/01/2019   AST 25 01/01/2019   NA 140 01/01/2019   K 4.4 01/01/2019   CL 100 01/01/2019  CREATININE 1.28 01/01/2019   BUN 28 (H) 01/01/2019   CO2 32 01/01/2019   TSH 0.99 01/01/2019   PSA 4.92 (H) 01/01/2019   INR 2.4 12/16/2018   HGBA1C 6.8 (H) 01/01/2019   MICROALBUR 3.1 (H) 09/24/2017      Assessment & Plan:

## 2019-01-01 NOTE — Patient Instructions (Signed)
Please continue all other medications as before, and refills have been done if requested - the pain medication  Please have the pharmacy call with any other refills you may need.  Please continue your efforts at being more active, low cholesterol diet, and weight control.  You are otherwise up to date with prevention measures today.  Please keep your appointments with your specialists as you may have planned  Please go to the LAB in the Basement (turn left off the elevator) for the tests to be done today  You will be contacted by phone if any changes need to be made immediately.  Otherwise, you will receive a letter about your results with an explanation, but please check with MyChart first.  Please remember to sign up for MyChart if you have not done so, as this will be important to you in the future with finding out test results, communicating by private email, and scheduling acute appointments online when needed.  Please return in 6 months, or sooner if needed

## 2019-01-02 ENCOUNTER — Encounter: Payer: Self-pay | Admitting: Internal Medicine

## 2019-01-02 ENCOUNTER — Other Ambulatory Visit: Payer: Self-pay | Admitting: Internal Medicine

## 2019-01-02 DIAGNOSIS — D509 Iron deficiency anemia, unspecified: Secondary | ICD-10-CM

## 2019-01-02 MED ORDER — POLYSACCHARIDE IRON COMPLEX 150 MG PO CAPS: 150.0000 mg | ORAL_CAPSULE | Freq: Every day | ORAL | 1 refills | Status: AC

## 2019-01-04 ENCOUNTER — Encounter: Payer: Self-pay | Admitting: Internal Medicine

## 2019-01-04 NOTE — Assessment & Plan Note (Signed)
stable overall by history and exam, recent data reviewed with pt, and pt to continue medical treatment as before,  to f/u any worsening symptoms or concerns  

## 2019-01-04 NOTE — Assessment & Plan Note (Signed)

## 2019-01-09 ENCOUNTER — Other Ambulatory Visit: Payer: Self-pay | Admitting: Gastroenterology

## 2019-01-20 ENCOUNTER — Other Ambulatory Visit: Payer: Self-pay | Admitting: Internal Medicine

## 2019-01-22 ENCOUNTER — Other Ambulatory Visit: Payer: Self-pay | Admitting: Internal Medicine

## 2019-01-28 ENCOUNTER — Ambulatory Visit (INDEPENDENT_AMBULATORY_CARE_PROVIDER_SITE_OTHER): Payer: Medicare Other | Admitting: Pharmacist Clinician (PhC)/ Clinical Pharmacy Specialist

## 2019-01-28 ENCOUNTER — Other Ambulatory Visit: Payer: Self-pay

## 2019-01-28 DIAGNOSIS — Z7901 Long term (current) use of anticoagulants: Secondary | ICD-10-CM | POA: Diagnosis not present

## 2019-01-28 DIAGNOSIS — I4891 Unspecified atrial fibrillation: Secondary | ICD-10-CM

## 2019-01-28 LAB — POCT INR: INR: 2.3 (ref 2.0–3.0)

## 2019-02-01 ENCOUNTER — Encounter (HOSPITAL_COMMUNITY): Payer: Self-pay | Admitting: Emergency Medicine

## 2019-02-01 ENCOUNTER — Other Ambulatory Visit: Payer: Self-pay

## 2019-02-01 ENCOUNTER — Inpatient Hospital Stay (HOSPITAL_COMMUNITY)
Admission: EM | Admit: 2019-02-01 | Discharge: 2019-02-05 | DRG: 177 | Disposition: A | Discharge status: Home / Self Care | Payer: Medicare Other | Attending: Internal Medicine | Admitting: Internal Medicine

## 2019-02-01 ENCOUNTER — Emergency Department (HOSPITAL_COMMUNITY): Payer: Medicare Other

## 2019-02-01 DIAGNOSIS — M1A9XX0 Chronic gout, unspecified, without tophus (tophi): Secondary | ICD-10-CM | POA: Diagnosis present

## 2019-02-01 DIAGNOSIS — N1831 Chronic kidney disease, stage 3a: Secondary | ICD-10-CM | POA: Diagnosis not present

## 2019-02-01 DIAGNOSIS — I442 Atrioventricular block, complete: Secondary | ICD-10-CM

## 2019-02-01 DIAGNOSIS — J9601 Acute respiratory failure with hypoxia: Secondary | ICD-10-CM | POA: Diagnosis not present

## 2019-02-01 DIAGNOSIS — I10 Essential (primary) hypertension: Secondary | ICD-10-CM | POA: Diagnosis not present

## 2019-02-01 DIAGNOSIS — I252 Old myocardial infarction: Secondary | ICD-10-CM

## 2019-02-01 DIAGNOSIS — I4892 Unspecified atrial flutter: Secondary | ICD-10-CM | POA: Diagnosis not present

## 2019-02-01 DIAGNOSIS — R509 Fever, unspecified: Secondary | ICD-10-CM | POA: Diagnosis not present

## 2019-02-01 DIAGNOSIS — Z79899 Other long term (current) drug therapy: Secondary | ICD-10-CM

## 2019-02-01 DIAGNOSIS — E1122 Type 2 diabetes mellitus with diabetic chronic kidney disease: Secondary | ICD-10-CM | POA: Diagnosis present

## 2019-02-01 DIAGNOSIS — R531 Weakness: Secondary | ICD-10-CM | POA: Diagnosis not present

## 2019-02-01 DIAGNOSIS — U071 COVID-19: Principal | ICD-10-CM

## 2019-02-01 DIAGNOSIS — E1142 Type 2 diabetes mellitus with diabetic polyneuropathy: Secondary | ICD-10-CM | POA: Diagnosis not present

## 2019-02-01 DIAGNOSIS — K219 Gastro-esophageal reflux disease without esophagitis: Secondary | ICD-10-CM | POA: Diagnosis present

## 2019-02-01 DIAGNOSIS — Z951 Presence of aortocoronary bypass graft: Secondary | ICD-10-CM | POA: Diagnosis not present

## 2019-02-01 DIAGNOSIS — Z955 Presence of coronary angioplasty implant and graft: Secondary | ICD-10-CM

## 2019-02-01 DIAGNOSIS — Z833 Family history of diabetes mellitus: Secondary | ICD-10-CM

## 2019-02-01 DIAGNOSIS — R5381 Other malaise: Secondary | ICD-10-CM | POA: Diagnosis not present

## 2019-02-01 DIAGNOSIS — IMO0002 Reserved for concepts with insufficient information to code with codable children: Secondary | ICD-10-CM | POA: Diagnosis present

## 2019-02-01 DIAGNOSIS — E785 Hyperlipidemia, unspecified: Secondary | ICD-10-CM | POA: Diagnosis present

## 2019-02-01 DIAGNOSIS — R05 Cough: Secondary | ICD-10-CM | POA: Diagnosis not present

## 2019-02-01 DIAGNOSIS — R0602 Shortness of breath: Secondary | ICD-10-CM | POA: Diagnosis not present

## 2019-02-01 DIAGNOSIS — Z950 Presence of cardiac pacemaker: Secondary | ICD-10-CM | POA: Diagnosis not present

## 2019-02-01 DIAGNOSIS — G309 Alzheimer's disease, unspecified: Secondary | ICD-10-CM | POA: Diagnosis present

## 2019-02-01 DIAGNOSIS — I130 Hypertensive heart and chronic kidney disease with heart failure and stage 1 through stage 4 chronic kidney disease, or unspecified chronic kidney disease: Secondary | ICD-10-CM | POA: Diagnosis present

## 2019-02-01 DIAGNOSIS — I482 Chronic atrial fibrillation, unspecified: Secondary | ICD-10-CM | POA: Diagnosis not present

## 2019-02-01 DIAGNOSIS — E1165 Type 2 diabetes mellitus with hyperglycemia: Secondary | ICD-10-CM | POA: Diagnosis present

## 2019-02-01 DIAGNOSIS — N179 Acute kidney failure, unspecified: Secondary | ICD-10-CM

## 2019-02-01 DIAGNOSIS — Z87891 Personal history of nicotine dependence: Secondary | ICD-10-CM

## 2019-02-01 DIAGNOSIS — R0902 Hypoxemia: Secondary | ICD-10-CM

## 2019-02-01 DIAGNOSIS — J1282 Pneumonia due to coronavirus disease 2019: Secondary | ICD-10-CM | POA: Diagnosis present

## 2019-02-01 DIAGNOSIS — F0280 Dementia in other diseases classified elsewhere without behavioral disturbance: Secondary | ICD-10-CM | POA: Diagnosis present

## 2019-02-01 DIAGNOSIS — Z7984 Long term (current) use of oral hypoglycemic drugs: Secondary | ICD-10-CM

## 2019-02-01 DIAGNOSIS — Z7901 Long term (current) use of anticoagulants: Secondary | ICD-10-CM

## 2019-02-01 DIAGNOSIS — I2510 Atherosclerotic heart disease of native coronary artery without angina pectoris: Secondary | ICD-10-CM | POA: Diagnosis not present

## 2019-02-01 DIAGNOSIS — E1140 Type 2 diabetes mellitus with diabetic neuropathy, unspecified: Secondary | ICD-10-CM | POA: Diagnosis not present

## 2019-02-01 DIAGNOSIS — R001 Bradycardia, unspecified: Secondary | ICD-10-CM | POA: Diagnosis not present

## 2019-02-01 DIAGNOSIS — R0689 Other abnormalities of breathing: Secondary | ICD-10-CM | POA: Diagnosis not present

## 2019-02-01 DIAGNOSIS — R791 Abnormal coagulation profile: Secondary | ICD-10-CM | POA: Diagnosis present

## 2019-02-01 DIAGNOSIS — I5032 Chronic diastolic (congestive) heart failure: Secondary | ICD-10-CM | POA: Diagnosis not present

## 2019-02-01 DIAGNOSIS — D649 Anemia, unspecified: Secondary | ICD-10-CM | POA: Diagnosis present

## 2019-02-01 DIAGNOSIS — Z66 Do not resuscitate: Secondary | ICD-10-CM | POA: Diagnosis present

## 2019-02-01 DIAGNOSIS — N400 Enlarged prostate without lower urinary tract symptoms: Secondary | ICD-10-CM | POA: Diagnosis present

## 2019-02-01 LAB — COMPREHENSIVE METABOLIC PANEL
ALT: 38 U/L (ref 0–44)
AST: 49 U/L — ABNORMAL HIGH (ref 15–41)
Albumin: 4.5 g/dL (ref 3.5–5.0)
Alkaline Phosphatase: 72 U/L (ref 38–126)
Anion gap: 14 (ref 5–15)
BUN: 52 mg/dL — ABNORMAL HIGH (ref 8–23)
CO2: 29 mmol/L (ref 22–32)
Calcium: 8.9 mg/dL (ref 8.9–10.3)
Chloride: 95 mmol/L — ABNORMAL LOW (ref 98–111)
Creatinine, Ser: 2.43 mg/dL — ABNORMAL HIGH (ref 0.61–1.24)
GFR calc Af Amer: 30 mL/min — ABNORMAL LOW (ref >60)
GFR calc non Af Amer: 26 mL/min — ABNORMAL LOW (ref >60)
Glucose, Bld: 109 mg/dL — ABNORMAL HIGH (ref 70–99)
Potassium: 4.6 mmol/L (ref 3.5–5.1)
Sodium: 138 mmol/L (ref 135–145)
Total Bilirubin: 0.5 mg/dL (ref 0.3–1.2)
Total Protein: 7.7 g/dL (ref 6.5–8.1)

## 2019-02-01 LAB — CBC WITH DIFFERENTIAL/PLATELET
Abs Immature Granulocytes: 0.02 10*3/uL (ref 0.00–0.07)
Basophils Absolute: 0 10*3/uL (ref 0.0–0.1)
Basophils Relative: 0 %
Eosinophils Absolute: 0 10*3/uL (ref 0.0–0.5)
Eosinophils Relative: 0 %
HCT: 32.2 % — ABNORMAL LOW (ref 39.0–52.0)
Hemoglobin: 9.4 g/dL — ABNORMAL LOW (ref 13.0–17.0)
Immature Granulocytes: 0 %
Lymphocytes Relative: 12 %
Lymphs Abs: 0.6 10*3/uL — ABNORMAL LOW (ref 0.7–4.0)
MCH: 24.2 pg — ABNORMAL LOW (ref 26.0–34.0)
MCHC: 29.2 g/dL — ABNORMAL LOW (ref 30.0–36.0)
MCV: 83 fL (ref 80.0–100.0)
Monocytes Absolute: 0.8 10*3/uL (ref 0.1–1.0)
Monocytes Relative: 14 %
Neutro Abs: 3.9 10*3/uL (ref 1.7–7.7)
Neutrophils Relative %: 74 %
Platelets: 161 10*3/uL (ref 150–400)
RBC: 3.88 MIL/uL — ABNORMAL LOW (ref 4.22–5.81)
RDW: 18.3 % — ABNORMAL HIGH (ref 11.5–15.5)
WBC: 5.3 10*3/uL (ref 4.0–10.5)
nRBC: 0 % (ref 0.0–0.2)

## 2019-02-01 LAB — FERRITIN: Ferritin: 31 ng/mL (ref 24–336)

## 2019-02-01 LAB — GLUCOSE, CAPILLARY: Glucose-Capillary: 144 mg/dL — ABNORMAL HIGH (ref 70–99)

## 2019-02-01 LAB — PROTIME-INR
INR: 1.7 — ABNORMAL HIGH (ref 0.8–1.2)
Prothrombin Time: 19.5 seconds — ABNORMAL HIGH (ref 11.4–15.2)

## 2019-02-01 LAB — LACTIC ACID, PLASMA
Lactic Acid, Venous: 1.1 mmol/L (ref 0.5–1.9)
Lactic Acid, Venous: 1.3 mmol/L (ref 0.5–1.9)

## 2019-02-01 LAB — PROCALCITONIN: Procalcitonin: <0.1 ng/mL

## 2019-02-01 LAB — LACTATE DEHYDROGENASE: LDH: 213 U/L — ABNORMAL HIGH (ref 98–192)

## 2019-02-01 LAB — POC SARS CORONAVIRUS 2 AG -  ED: SARS Coronavirus 2 Ag: POSITIVE — AB

## 2019-02-01 LAB — CBG MONITORING, ED: Glucose-Capillary: 103 mg/dL — ABNORMAL HIGH (ref 70–99)

## 2019-02-01 LAB — C-REACTIVE PROTEIN: CRP: 1.3 mg/dL — ABNORMAL HIGH (ref <1.0)

## 2019-02-01 LAB — ABO/RH: ABO/RH(D): B POS

## 2019-02-01 LAB — TRIGLYCERIDES: Triglycerides: 95 mg/dL (ref <150)

## 2019-02-01 LAB — D-DIMER, QUANTITATIVE: D-Dimer, Quant: 0.36 ug/mL-FEU (ref 0.00–0.50)

## 2019-02-01 LAB — FIBRINOGEN: Fibrinogen: 316 mg/dL (ref 210–475)

## 2019-02-01 MED ORDER — SODIUM CHLORIDE 0.9 % IV BOLUS: 500.0000 mL | Freq: Once | INTRAVENOUS | Status: DC

## 2019-02-01 MED ORDER — PANTOPRAZOLE SODIUM 40 MG PO TBEC
40.0000 mg | DELAYED_RELEASE_TABLET | Freq: Every day | ORAL | Status: DC
  Administered 2019-02-01 – 2019-02-05 (×5): 40 mg via ORAL
  Filled 2019-02-01 (×5): qty 1

## 2019-02-01 MED ORDER — TAMSULOSIN HCL 0.4 MG PO CAPS
0.4000 mg | ORAL_CAPSULE | Freq: Every day | ORAL | Status: DC
  Administered 2019-02-01 – 2019-02-05 (×5): 0.4 mg via ORAL
  Filled 2019-02-01 (×5): qty 1

## 2019-02-01 MED ORDER — PANTOPRAZOLE SODIUM 40 MG PO TBEC: 40.0000 mg | DELAYED_RELEASE_TABLET | Freq: Every day | ORAL | Status: DC

## 2019-02-01 MED ORDER — DM-GUAIFENESIN ER 30-600 MG PO TB12: 1.0000 | ORAL_TABLET | Freq: Two times a day (BID) | ORAL | Status: DC

## 2019-02-01 MED ORDER — ACETAMINOPHEN 325 MG PO TABS
650.0000 mg | ORAL_TABLET | Freq: Four times a day (QID) | ORAL | Status: DC | PRN
  Administered 2019-02-01 – 2019-02-03 (×2): 650 mg via ORAL
  Filled 2019-02-01 (×2): qty 2

## 2019-02-01 MED ORDER — BENZONATATE 100 MG PO CAPS
100.0000 mg | ORAL_CAPSULE | Freq: Three times a day (TID) | ORAL | Status: DC
  Administered 2019-02-01 – 2019-02-05 (×12): 100 mg via ORAL
  Filled 2019-02-01 (×12): qty 1

## 2019-02-01 MED ORDER — IPRATROPIUM-ALBUTEROL 20-100 MCG/ACT IN AERS
1.0000 | INHALATION_SPRAY | Freq: Four times a day (QID) | RESPIRATORY_TRACT | Status: DC
  Administered 2019-02-01 – 2019-02-05 (×16): 1 via RESPIRATORY_TRACT
  Filled 2019-02-01 (×2): qty 4

## 2019-02-01 MED ORDER — OXYBUTYNIN CHLORIDE ER 5 MG PO TB24
10.0000 mg | ORAL_TABLET | Freq: Every day | ORAL | Status: DC
  Administered 2019-02-01 – 2019-02-04 (×4): 10 mg via ORAL
  Filled 2019-02-01 (×6): qty 2

## 2019-02-01 MED ORDER — ZINC SULFATE 220 (50 ZN) MG PO CAPS: 220.0000 mg | ORAL_CAPSULE | Freq: Every day | ORAL | Status: DC

## 2019-02-01 MED ORDER — SODIUM CHLORIDE 0.9 % IV SOLN
Freq: Once | INTRAVENOUS | Status: AC
  Administered 2019-02-01: 500 mL via INTRAVENOUS

## 2019-02-01 MED ORDER — ALLOPURINOL 100 MG PO TABS
300.0000 mg | ORAL_TABLET | Freq: Every day | ORAL | Status: DC
  Administered 2019-02-01 – 2019-02-05 (×5): 300 mg via ORAL
  Filled 2019-02-01 (×7): qty 3

## 2019-02-01 MED ORDER — POLYSACCHARIDE IRON COMPLEX 150 MG PO CAPS
150.0000 mg | ORAL_CAPSULE | Freq: Every day | ORAL | Status: DC
  Administered 2019-02-01 – 2019-02-05 (×5): 150 mg via ORAL
  Filled 2019-02-01 (×7): qty 1

## 2019-02-01 MED ORDER — ONDANSETRON HCL 4 MG PO TABS: 4.0000 mg | ORAL_TABLET | Freq: Four times a day (QID) | ORAL | Status: DC | PRN

## 2019-02-01 MED ORDER — ASCORBIC ACID 500 MG PO TABS: 500.0000 mg | ORAL_TABLET | Freq: Every day | ORAL | Status: DC

## 2019-02-01 MED ORDER — SODIUM CHLORIDE 0.9 % IV SOLN
100.0000 mg | Freq: Every day | INTRAVENOUS | Status: AC
  Administered 2019-02-02 – 2019-02-05 (×4): 100 mg via INTRAVENOUS
  Filled 2019-02-01: qty 20
  Filled 2019-02-01 (×2): qty 100
  Filled 2019-02-01: qty 20

## 2019-02-01 MED ORDER — SODIUM CHLORIDE 0.9 % IV SOLN
200.0000 mg | Freq: Once | INTRAVENOUS | Status: AC
  Administered 2019-02-01: 200 mg via INTRAVENOUS
  Filled 2019-02-01: qty 40

## 2019-02-01 MED ORDER — ZINC SULFATE 220 (50 ZN) MG PO CAPS
220.0000 mg | ORAL_CAPSULE | Freq: Every day | ORAL | Status: DC
  Administered 2019-02-01 – 2019-02-05 (×5): 220 mg via ORAL
  Filled 2019-02-01 (×5): qty 1

## 2019-02-01 MED ORDER — ONDANSETRON HCL 4 MG/2ML IJ SOLN: 4.0000 mg | Freq: Four times a day (QID) | INTRAMUSCULAR | Status: DC | PRN

## 2019-02-01 MED ORDER — ADULT MULTIVITAMIN W/MINERALS CH
1.0000 | ORAL_TABLET | Freq: Every morning | ORAL | Status: DC
  Administered 2019-02-01 – 2019-02-05 (×5): 1 via ORAL
  Filled 2019-02-01 (×5): qty 1

## 2019-02-01 MED ORDER — CARVEDILOL 12.5 MG PO TABS: 25.0000 mg | ORAL_TABLET | Freq: Two times a day (BID) | ORAL | Status: DC

## 2019-02-01 MED ORDER — DEXAMETHASONE SODIUM PHOSPHATE 10 MG/ML IJ SOLN
6.0000 mg | INTRAMUSCULAR | Status: DC
  Administered 2019-02-01 – 2019-02-05 (×5): 6 mg via INTRAVENOUS
  Filled 2019-02-01 (×5): qty 1

## 2019-02-01 MED ORDER — VITAMIN D 25 MCG (1000 UNIT) PO TABS
1000.0000 [IU] | ORAL_TABLET | Freq: Two times a day (BID) | ORAL | Status: DC
  Administered 2019-02-01 – 2019-02-05 (×9): 1000 [IU] via ORAL
  Filled 2019-02-01 (×17): qty 1

## 2019-02-01 MED ORDER — TAMSULOSIN HCL 0.4 MG PO CAPS: 0.4000 mg | ORAL_CAPSULE | Freq: Every day | ORAL | Status: DC

## 2019-02-01 MED ORDER — HYDROCODONE-ACETAMINOPHEN 10-325 MG PO TABS
1.0000 | ORAL_TABLET | Freq: Four times a day (QID) | ORAL | Status: DC | PRN
  Administered 2019-02-03 – 2019-02-04 (×3): 1 via ORAL
  Filled 2019-02-01 (×3): qty 1

## 2019-02-01 MED ORDER — CARVEDILOL 12.5 MG PO TABS
25.0000 mg | ORAL_TABLET | Freq: Two times a day (BID) | ORAL | Status: DC
  Administered 2019-02-01 – 2019-02-05 (×9): 25 mg via ORAL
  Filled 2019-02-01 (×9): qty 2

## 2019-02-01 MED ORDER — DM-GUAIFENESIN ER 30-600 MG PO TB12
1.0000 | ORAL_TABLET | Freq: Two times a day (BID) | ORAL | Status: DC
  Administered 2019-02-01 – 2019-02-05 (×9): 1 via ORAL
  Filled 2019-02-01 (×9): qty 1

## 2019-02-01 MED ORDER — ASCORBIC ACID 500 MG PO TABS
500.0000 mg | ORAL_TABLET | Freq: Every day | ORAL | Status: DC
  Administered 2019-02-02 – 2019-02-05 (×4): 500 mg via ORAL
  Filled 2019-02-01 (×4): qty 1

## 2019-02-01 MED ORDER — WARFARIN SODIUM 5 MG PO TABS
5.0000 mg | ORAL_TABLET | Freq: Every day | ORAL | Status: DC
  Administered 2019-02-02: 5 mg via ORAL
  Filled 2019-02-01 (×2): qty 1

## 2019-02-01 MED ORDER — DONEPEZIL HCL 5 MG PO TABS
5.0000 mg | ORAL_TABLET | Freq: Every day | ORAL | Status: DC
  Administered 2019-02-01 – 2019-02-05 (×5): 5 mg via ORAL
  Filled 2019-02-01 (×7): qty 1

## 2019-02-01 MED ORDER — INSULIN ASPART 100 UNIT/ML ~~LOC~~ SOLN
0.0000 [IU] | Freq: Three times a day (TID) | SUBCUTANEOUS | Status: DC
  Administered 2019-02-01: 0 [IU] via SUBCUTANEOUS
  Administered 2019-02-02: 3 [IU] via SUBCUTANEOUS
  Administered 2019-02-02 – 2019-02-03 (×4): 2 [IU] via SUBCUTANEOUS
  Administered 2019-02-04 – 2019-02-05 (×5): 3 [IU] via SUBCUTANEOUS

## 2019-02-01 MED ORDER — ATORVASTATIN CALCIUM 40 MG PO TABS: 80.0000 mg | ORAL_TABLET | Freq: Every day | ORAL | Status: DC

## 2019-02-01 MED ORDER — INSULIN ASPART 100 UNIT/ML ~~LOC~~ SOLN
0.0000 [IU] | Freq: Every day | SUBCUTANEOUS | Status: DC
  Administered 2019-02-03 – 2019-02-04 (×2): 3 [IU] via SUBCUTANEOUS

## 2019-02-01 MED ORDER — WARFARIN - PHARMACIST DOSING INPATIENT: Freq: Every day | Status: DC

## 2019-02-01 MED ORDER — SODIUM CHLORIDE 0.9 % IV SOLN
INTRAVENOUS | Status: AC
  Administered 2019-02-01: 1000 mL via INTRAVENOUS

## 2019-02-01 MED ORDER — TRAZODONE HCL 50 MG PO TABS
25.0000 mg | ORAL_TABLET | Freq: Every evening | ORAL | Status: DC | PRN
  Administered 2019-02-03 – 2019-02-04 (×2): 25 mg via ORAL
  Filled 2019-02-01 (×3): qty 1

## 2019-02-01 MED ORDER — FLUTICASONE PROPIONATE 50 MCG/ACT NA SUSP
2.0000 | Freq: Every day | NASAL | Status: DC
  Administered 2019-02-01 – 2019-02-05 (×5): 2 via NASAL
  Filled 2019-02-01 (×2): qty 16

## 2019-02-01 MED ORDER — ADULT MULTIVITAMIN W/MINERALS CH: 1.0000 | ORAL_TABLET | Freq: Every morning | ORAL | Status: DC

## 2019-02-01 MED ORDER — ATORVASTATIN CALCIUM 40 MG PO TABS
80.0000 mg | ORAL_TABLET | Freq: Every day | ORAL | Status: DC
  Administered 2019-02-01 – 2019-02-05 (×5): 80 mg via ORAL
  Filled 2019-02-01 (×2): qty 2
  Filled 2019-02-01: qty 4
  Filled 2019-02-01 (×2): qty 2

## 2019-02-01 MED ORDER — GABAPENTIN 300 MG PO CAPS
300.0000 mg | ORAL_CAPSULE | Freq: Three times a day (TID) | ORAL | Status: DC
  Administered 2019-02-01 – 2019-02-05 (×12): 300 mg via ORAL
  Filled 2019-02-01 (×12): qty 1

## 2019-02-01 MED ORDER — WARFARIN SODIUM 5 MG PO TABS: 5.0000 mg | ORAL_TABLET | Freq: Every day | ORAL | Status: DC

## 2019-02-01 NOTE — ED Triage Notes (Signed)
Pt brought in by ems for fever, cough, and shortness of breath. Pt says he has just been generally weak for a week.

## 2019-02-01 NOTE — ED Provider Notes (Signed)
[facility] Provider Note   CSN: [id number] Arrival date & time: 02/01/19  0920     History Chief Complaint  Patient presents with  . Fever    [name] is a 74 y.o. male.  HPI Patient has been feeling bad around a week.  Has fever cough and shortness of breath.  Has had some white sputum production.  Has had contact with a niece who has had a cold.  No known Covid contacts.  Fevers up to 102.3 upon arrival.  Room air saturation was 84%.  No swelling in his legs.  No chest pain.  States he feels fatigued.  Appears to be in atrial flutter on the monitor.  States did not know a history of irregular heartbeat but reviewing records does have a history of atrial flutter.    Past Medical History:  Diagnosis Date  . Aortic root dilatation ([place]) 02/02/2011  . Arthritis    "lower back; going back down both my sciatic nerves"  . 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 BENIGN PROSTATIC HYPERTROPHY 08/29/2006   takes Flomax daily  . 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 Cataracts, bilateral    immature  . CHF (congestive heart failure) (HCC)    takes Lasix daily  . Chronic back pain    HNP   . CORONARY ARTERY DISEASE 08/29/2006   takes Coumadin daily  . DEPRESSION 08/29/2006  . DIABETES MELLITUS, TYPE II 08/29/2006   takes Metformin,Januvia,and Glipizide  daily  . DIASTOLIC HEART FAILURE, CHRONIC 06/09/2008  . GOUT 04/22/2007   takes Allopurinol daily  . History of migraine    72yr ago  . HYPERLIPIDEMIA 08/29/2006   takes Atorvastatin daily  . HYPERTENSION 08/29/2006   takes Lisinopril daily  . INSOMNIA-SLEEP DISORDER-UNSPEC 10/23/2007  . Left lumbar radiculopathy 05/30/2010  . LUMBAR RADICULOPATHY, RIGHT 06/10/2007  . Muscle spasm    takes Zanaflex daily  . Myocardial infarction ([facility] 12/27/10   "I've had several MIs"  . 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 PACEMAKER, PERMANENT 03/04/2008   pt denies this date  . Peripheral neuropathy     takes Gabapentin daily  . Presence of permanent cardiac pacemaker   . Shortness of breath dyspnea    "all my life" with exertion  . Sleep apnea    "if I lay flat I quit breathing; HOB up I'm fine"    Patient Active Problem List   Diagnosis Date Noted  . Esophageal dysmotility 08/15/2018  . Gastroesophageal reflux disease 08/15/2018  . Nail problem 07/10/2018  . Right knee pain 03/13/2018  . Degenerative joint disease of knee, left 03/13/2018  . Nondisplaced longitudinal fracture of right patella, initial encounter for closed fracture 03/13/2018  . Chondrocalcinosis 02/05/2018  . Acute respiratory failure with hypoxia ([facility] 01/16/2018  . Elevated troponin 01/16/2018  . Weakness 12/21/2017  . Hypersomnolence 09/20/2017  . Acute renal failure superimposed on stage 2 chronic kidney disease ([facility] 06/16/2017  . Left cervical radiculopathy 02/07/2017  . Trochanteric bursitis, right hip 01/01/2017  . Right hip pain 12/26/2016  . Knee instability, right 11/03/2016  . Urinary frequency 10/20/2016  . Right foot pain 09/29/2016  . Prepatellar bursitis 06/29/2016  . Left knee pain 06/28/2016  . Ingrown nail 06/28/2016  . Acute gouty arthritis 05/07/2016  . Left shoulder pain 02/09/2016  . Memory loss 01/19/2016  . Ingrown toenail 09/13/2015  . Renal insufficiency 09/08/2015  . Right leg swelling 07/13/2015  . Leg wound, right 06/15/2015  . Rash  06/06/2015  . Anemia 05/24/2015  . Cellulitis of leg, right 05/13/2015  . Greater trochanteric bursitis of left hip 08/12/2014  . Left leg pain 07/15/2014  . 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 Lumbar stenosis with neurogenic claudication 03/23/2014  . Long term current use of anticoagulant therapy 02/27/2014  . Dysphagia, pharyngoesophageal phase 12/01/2013  . 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 Dysphagia 11/11/2013  . Lumbago 11/03/2013  . Low back pain radiating to right leg  11/03/2013  . Abnormality of gait 11/03/2013  . Difficulty walking 11/03/2013  . Loss of weight 10/29/2013  . Atrial fibrillation ([place]) 08/26/2013  . Chest pain 04/05/2013  . Plantar fasciitis of right foot 02/18/2013  . Bruit 08/21/2012  . Sciatica of left side 08/15/2012  . Left hip pain 08/15/2012  . Headache(784.0) 05/03/2012  . Increased prostate specific antigen (PSA) velocity 11/11/2011  . Right sided sciatica 11/10/2011  . Chronic low back pain 05/03/2011  . Aortic root dilatation ([place]) 02/02/2011  . Wheezing without diagnosis of asthma 02/02/2011  . Syncope 12/27/2010  . Left lumbar radiculopathy 05/30/2010  . Wellness examination 05/30/2010  . 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 DOE (dyspnea on exertion) 06/10/2008  . DIASTOLIC HEART FAILURE, CHRONIC 06/09/2008  . KNEE PAIN, BILATERAL 04/21/2008  . LEG PAIN, BILATERAL 04/21/2008  . 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 PACEMAKER, PERMANENT 03/04/2008  . INSOMNIA-SLEEP DISORDER-UNSPEC 10/23/2007  . Lumbar radiculopathy, chronic 06/10/2007  . GOUT 04/22/2007  . 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 ERECTILE DYSFUNCTION 11/26/2006  . ALLERGIC RHINITIS 11/26/2006  . Type 2 diabetes, uncontrolled, with neuropathy ([place]) 08/29/2006  . Hyperlipidemia 08/29/2006  . Overweight(278.02) 08/29/2006  . Depression 08/29/2006  . Essential hypertension 08/29/2006  . CORONARY ARTERY DISEASE 08/29/2006  . BENIGN PROSTATIC HYPERTROPHY 08/29/2006    Past Surgical History:  Procedure Laterality Date  . COLONOSCOPY    . CORONARY ANGIOPLASTY WITH STENT PLACEMENT  12/27/10   "I've had a total of 9 cardiac stents put in"  . CORONARY ARTERY BYPASS GRAFT  1992   CABG X 2  . ESOPHAGOGASTRODUODENOSCOPY N/A 12/01/2013   Procedure: ESOPHAGOGASTRODUODENOSCOPY (EGD);  Surgeon: Lafayette Dragon, MD;  Location: [facility];  Service: Endoscopy;  Laterality: N/A;  . INSERT / REPLACE / REMOVE PACEMAKER  ~ 2004    initial pacemaker placement  . INSERT / REPLACE / REMOVE PACEMAKER  10/2009   generator change  . LUMBAR LAMINECTOMY/DECOMPRESSION MICRODISCECTOMY Right 03/23/2014   Procedure: LAMINECTOMY AND FORAMINOTOMY RIGHT LUMBAR THREE-FOUR,LUMBAR FOUR-FIVE, LUMBAR FIVE-SACRAL ONE;  Surgeon: Charlie Pitter, MD;  Location: [facility];  Service: Neurosurgery;  Laterality: Right;  right  . LUMBAR LAMINECTOMY/DECOMPRESSION MICRODISCECTOMY Left 12/01/2014   Procedure: Left Lumbar Three-Four, Lumbar Four-Five Laminectomy and Foraminotomy;  Surgeon: Earnie Larsson, MD;  Location: [facility];  Service: Neurosurgery;  Laterality: Left;  . s/p left arm surgury after work accident  1991   "2000# steel fell on it"  . s/p right hand surgury for foreign object  1970's   "piece of wood went in my hand; had to get that out"       Family History  Problem Relation Age of Onset  . Diabetes Mother   . Diabetes Sister   . Heart disease Sister        2 sister died with heart disease  . Coronary artery disease Other 54       male, first degree relative  . Diabetes Other  1st degree relative  . Diabetes Sister   . Lung cancer Sister        deceased  . Diabetes Sister     Social History   Tobacco Use  . Smoking status: Former Smoker    Packs/day: 3.00    Years: 9.00    Pack years: 27.00    Types: Cigarettes    Quit date: 02/26/1973    Years since quitting: 45.9  . Smokeless tobacco: Former Systems developer  . Tobacco comment: quit smoking 77yr ago  Substance Use Topics  . Alcohol use: No    Alcohol/week: 0.0 standard drinks  . Drug use: No    Comment: "used pouches of tobacco for a long time; quit those 12/09/1970"    Home Medications Prior to Admission medications   Medication Sig Start Date End Date Taking? Authorizing Provider  albuterol (PROAIR HFA) 108 (90 Base) MCG/ACT inhaler Inhale 2 puffs into the lungs every 6 (six) hours as needed for wheezing or shortness of breath. 02/05/18   JBiagio Borg MD    allopurinol (ZYLOPRIM) 300 MG tablet TAKE 1 TABLET BY MOUTH  DAILY 01/20/19   JBiagio Borg MD  atorvastatin (LIPITOR) 80 MG tablet TAKE 1 TABLET BY MOUTH  DAILY 01/23/19   JBiagio Borg MD  benzonatate (TESSALON PERLES) 100 MG capsule 1-2 tab by mouth every 6 hrs as needed for cough 01/31/18   JBiagio Borg MD  Blood Glucose Monitoring Suppl (ONE TOUCH ULTRA 2) w/Device KIT Use as directed 10/12/15   JBiagio Borg MD  carvedilol (COREG) 25 MG tablet TAKE 1 TABLET BY MOUTH TWO  TIMES DAILY 01/20/19   JBiagio Borg MD  Cholecalciferol (VITAMIN D) 1000 UNITS capsule Take 1,000 Units by mouth 2 (two) times daily. Reported on 02/15/2015 07/31/13   [provider]  dextromethorphan-guaiFENesin (MUCINEX DM) 30-600 MG 12hr tablet Take 1 tablet by mouth 2 (two) times daily. 01/17/18   MKathie Dike MD  donepezil (ARICEPT) 5 MG tablet TAKE 1 TABLET BY MOUTH  DAILY 01/23/19   JBiagio Borg MD  fluticasone ([facility] 50 MCG/ACT nasal spray Place 2 sprays into both nostrils daily. 07/19/18   JBiagio Borg MD  furosemide (LASIX) 40 MG tablet Take 2 tablets (80 mg total) by mouth 2 (two) times daily. 01/31/18   JBiagio Borg MD  gabapentin (NEURONTIN) 300 MG capsule TAKE 2 CAPSULES BY MOUTH 3  TIMES DAILY 01/20/19   JBiagio Borg MD  glipiZIDE (GLUCOTROL XL) 10 MG 24 hr tablet TAKE 1 TABLET BY MOUTH  DAILY WITH BREAKFAST 01/20/19   JBiagio Borg MD  glucose blood (ONE TOUCH ULTRA TEST) test strip CHECK BLOOD SUGAR TWO TIMES DAILY 05/24/17   JBiagio Borg MD  HYDROcodone-acetaminophen ([facility] 10-325 MG tablet Take 1 tablet by mouth daily as needed. 01/01/19   JBiagio Borg MD  iron polysaccharides (NU-IRON) 150 MG capsule Take 1 capsule (150 mg total) by mouth daily. 01/02/19   JBiagio Borg MD  lisinopril (ZESTRIL) 2.5 MG tablet TAKE 1 TABLET BY MOUTH  DAILY 01/20/19   JBiagio Borg MD  metFORMIN (GLUCOPHAGE) 500 MG tablet TAKE 4 TABLETS BY MOUTH  DAILY 01/20/19   JBiagio Borg MD  Multiple  Vitamin (MULTIVITAMIN WITH MINERALS) TABS tablet Take 1 tablet by mouth every morning.    [provider]  [facility] LANC[facility] 325WMISC USE AS DIRECTED THREE TIMES DAILY TO CHECK BLOOD SUGAR 05/24/17   JBiagio Borg  MD  oxybutynin (DITROPAN-XL) 10 MG 24 hr tablet TAKE 1 TABLET BY MOUTH AT  BEDTIME 01/23/19   Biagio Borg, MD  pantoprazole (PROTONIX) 40 MG tablet TAKE 1 TABLET (40 MG TOTAL) BY MOUTH DAILY. TAKE 30 MINUTES BEFORE A MEAL. 01/09/19   Doran Stabler, MD  potassium chloride SA (KLOR-CON) 20 MEQ tablet TAKE 1 TABLET BY MOUTH  EVERY EVENING 01/20/19   Biagio Borg, MD  sitaGLIPtin (JANUVIA) 100 MG tablet Take 1 tablet (100 mg total) by mouth daily. 01/31/18   Biagio Borg, MD  tamsulosin (FLOMAX) 0.4 MG CAPS capsule TAKE 1 CAPSULE BY MOUTH  TWICE DAILY 01/20/19   Biagio Borg, MD  tiZANidine (ZANAFLEX) 4 MG tablet TAKE 1 TABLET BY MOUTH   EVERY 6 HOURS AS NEEDED FOR MUSCLE SPASMS. 09/12/18   Biagio Borg, MD  traZODone (DESYREL) 50 MG tablet Take 0.5-1 tablets (25-50 mg total) by mouth at bedtime as needed for sleep. 07/09/18   Biagio Borg, MD  warfarin (COUMADIN) 5 MG tablet TAKE 1/2 TO 1 TABLET BY MOUTH DAILY AS DIRECTED BY COUMADIN CLINIC 12/16/18   Evans Lance, MD    Allergies    Crestor [rosuvastatin calcium] and Latex  Review of Systems   Review of Systems  Constitutional: Positive for fatigue and fever. Negative for appetite change.  HENT: Negative for congestion.   Respiratory: Positive for shortness of breath.   Cardiovascular: Negative for chest pain.  Gastrointestinal: Negative for abdominal pain.  Genitourinary: Negative for flank pain.  Musculoskeletal: Negative for back pain.  Skin: Negative for rash.  Neurological: Negative for weakness.    Physical Exam Updated Vital Signs BP (!) 123/59   Pulse (!) 59   Temp (!) 102.3 F (39.1 C) (Oral)   Resp 17   Ht '6\' 2"'  (1.88 m)   Wt 99.3 kg   SpO2 100%   BMI 28.11 kg/m   Physical  Exam Vitals and nursing note reviewed.  HENT:     Head: Normocephalic.  Eyes:     Extraocular Movements: Extraocular movements intact.     Pupils: Pupils are equal, round, and reactive to light.  Cardiovascular:     Rate and Rhythm: Regular rhythm.  Pulmonary:     Breath sounds: No wheezing, rhonchi or rales.  Abdominal:     Tenderness: There is no abdominal tenderness.  Musculoskeletal:     Right lower leg: No edema.     Left lower leg: No edema.  Skin:    General: Skin is warm.     Capillary Refill: Capillary refill takes less than 2 seconds.  Neurological:     Mental Status: He is alert and oriented to person, place, and time.     ED Results / Procedures / Treatments   Labs (all labs ordered are listed, but only abnormal results are displayed) Labs Reviewed  CBC WITH DIFFERENTIAL/PLATELET - Abnormal; Notable for the following components:      Result Value   RBC 3.88 (*)    Hemoglobin 9.4 (*)    HCT 32.2 (*)    MCH 24.2 (*)    MCHC 29.2 (*)    RDW 18.3 (*)    Lymphs Abs 0.6 (*)    All other components within normal limits  COMPREHENSIVE METABOLIC PANEL - Abnormal; Notable for the following components:   Chloride 95 (*)    Glucose, Bld 109 (*)    BUN 52 (*)    Creatinine, Ser 2.43 (*)  AST 49 (*)    GFR calc non Af Amer 26 (*)    GFR calc Af Amer 30 (*)    All other components within normal limits  LACTATE DEHYDROGENASE - Abnormal; Notable for the following components:   LDH 213 (*)    All other components within normal limits  C-REACTIVE PROTEIN - Abnormal; Notable for the following components:   CRP 1.3 (*)    All other components within normal limits  PROTIME-INR - Abnormal; Notable for the following components:   Prothrombin Time 19.5 (*)    INR 1.7 (*)    All other components within normal limits  POC SARS CORONAVIRUS 2 AG -  ED - Abnormal; Notable for the following components:   SARS Coronavirus 2 Ag POSITIVE (*)    All other components within  normal limits  CULTURE, BLOOD (ROUTINE X 2)  CULTURE, BLOOD (ROUTINE X 2)  LACTIC ACID, PLASMA  D-DIMER, QUANTITATIVE (NOT AT [facility])  PROCALCITONIN  FERRITIN  TRIGLYCERIDES  FIBRINOGEN  LACTIC ACID, PLASMA    EKG EKG Interpretation  Date/Time:  Saturday February 01 2019 09:29:10 EST Ventricular Rate:  64 PR Interval:    QRS Duration: 175 QT Interval:  441 QTC Calculation: 455 R Axis:   136 Text Interpretation: Atrial flutter Nonspecific intraventricular conduction delay Lateral infarct, age indeterminate Confirmed by Davonna Belling [phone]) on 02/01/2019 10:23:05 AM   Radiology DG Chest Port 1 View  Result Date: 02/01/2019 CLINICAL DATA:  Shortness of breath. Additional history provided: Fever, cough and shortness of breath as well as generalized weakness. COVID test pending. EXAM: PORTABLE CHEST 1 VIEW COMPARISON:  Chest radiograph 01/16/2018 FINDINGS: Prior median sternotomy. Redemonstrated left chest dual lead pacer device. Overlying cardiac monitoring leads. Cardiomegaly.  Aortic atherosclerosis. Shallow inspiration radiograph. Mild bibasilar opacities may reflect incomplete atelectasis or developing pneumonia. No sizable pleural effusion or evidence of pneumothorax. No acute bony abnormality. IMPRESSION: Shallow inspiration radiograph. Bibasilar opacities may reflect incomplete atelectasis and/or pneumonia. Cardiomegaly.  Aortic atherosclerosis. Electronically Signed   By: Kellie Simmering DO   On: 02/01/2019 10:03    Procedures Procedures (including critical care time)  Medications Ordered in ED Medications  0.9 %  sodium chloride infusion (500 mLs Intravenous New Bag/Given 02/01/19 1017)    ED Course  I have reviewed the triage vital signs and the nursing notes.  Pertinent labs & imaging results that were available during my care of the patient were reviewed by me and considered in my medical decision making (see chart for details).    MDM Rules/Calculators/A&P                       Patient presents with fever cough shortness of breath.  Has been feeling bad for around a week.  Room air sats of 84% but improved on 2 L of oxygen.  Covid test positive.  Shows new acute kidney injury on lab work.  Will admit to hospitalist. Final Clinical Impression(s) / ED Diagnoses Final diagnoses:  COVID-19  AKI (acute kidney injury) ([place])  Hypoxia    Rx / DC Orders ED Discharge Orders    None       Davonna Belling, MD 02/01/19 1213

## 2019-02-01 NOTE — H&P (Addendum)
History and Physical    [name] ZOX:[id number] DOB: [birth year] DOA: 02/01/2019  PCP: Biagio Borg, MD   Patient coming from: Home  Chief Complaint: Fever, productive cough, and dyspnea  HPI: [name] is a 74 y.o. male with medical history significant for atrial fibrillation/flutter status post permanent pacemaker placement on anticoagulation with Coumadin, BPH, CAD with prior CABG, hypertension, dyslipidemia, diabetes, chronic gout, peripheral neuropathy who presented to the ED with complaints of fever, productive cough, and shortness of breath.  He has had whitish sputum production and was in contact with his niece who had a upper respiratory infection.  No other known Covid contacts.  He denies any chest pain or swelling in his legs and states that he overall feels quite fatigued over the last week.  He has otherwise been taking his medications as usual.  He denies any nausea, vomiting, or diarrhea.   ED Course: Temperature of 102.3 Fahrenheit noted on arrival with saturation approximately 84% on room air.  His Covid antigen testing is noted to be positive.  His hemoglobin is 9.4 which appears to be stable per his chronic anemia.  His 1 view chest x-ray demonstrates some findings of pneumonia and atelectasis at the bases.  His creatinine is currently 2.43 with baseline around 1.1-1.3.  His procalcitonin is less than 0.10, CRP 1.3, and ferritin 31.  His INR is currently 1.7 and uses Coumadin for anticoagulation.  He has been given a 500 mL fluid bolus and has been started on 2 L nasal cannula oxygen without any significant respiratory distress.  Review of Systems: All others reviewed as noted above and otherwise negative.  Past Medical History:  Diagnosis Date  . Aortic root dilatation ([place]) 02/02/2011  . Arthritis    "lower back; going back down both my sciatic nerves"  . 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 BENIGN PROSTATIC HYPERTROPHY 08/29/2006   takes Flomax  daily  . 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 Cataracts, bilateral    immature  . CHF (congestive heart failure) ([facility])    takes Lasix daily  . Chronic back pain    HNP   . CORONARY ARTERY DISEASE 08/29/2006   takes Coumadin daily  . DEPRESSION 08/29/2006  . DIABETES MELLITUS, TYPE II 08/29/2006   takes Metformin,Januvia,and Glipizide  daily  . DIASTOLIC HEART FAILURE, CHRONIC 06/09/2008  . GOUT 04/22/2007   takes Allopurinol daily  . History of migraine    50yr ago  . HYPERLIPIDEMIA 08/29/2006   takes Atorvastatin daily  . HYPERTENSION 08/29/2006   takes Lisinopril daily  . INSOMNIA-SLEEP DISORDER-UNSPEC 10/23/2007  . Left lumbar radiculopathy 05/30/2010  . LUMBAR RADICULOPATHY, RIGHT 06/10/2007  . Muscle spasm    takes Zanaflex daily  . Myocardial infarction ([facility] 12/27/10   "I've had several MIs"  . 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 PACEMAKER, PERMANENT 03/04/2008   pt denies this date  . Peripheral neuropathy    takes Gabapentin daily  . Presence of permanent cardiac pacemaker   . Shortness of breath dyspnea    "all my life" with exertion  . Sleep apnea    "if I lay flat I quit breathing; HOB up I'm fine"    Past Surgical History:  Procedure Laterality Date  . COLONOSCOPY    . CORONARY ANGIOPLASTY WITH STENT PLACEMENT  12/27/10   "I've had a total of 9 cardiac stents put in"  . CORONARY ARTERY BYPASS GRAFT  1992   CABG X 2  . ESOPHAGOGASTRODUODENOSCOPY  N/A 12/01/2013   Procedure: ESOPHAGOGASTRODUODENOSCOPY (EGD);  Surgeon: Lafayette Dragon, MD;  Location: [facility];  Service: Endoscopy;  Laterality: N/A;  . INSERT / REPLACE / REMOVE PACEMAKER  ~ 2004   initial pacemaker placement  . INSERT / REPLACE / REMOVE PACEMAKER  10/2009   generator change  . LUMBAR LAMINECTOMY/DECOMPRESSION MICRODISCECTOMY Right 03/23/2014   Procedure: LAMINECTOMY AND FORAMINOTOMY RIGHT LUMBAR THREE-FOUR,LUMBAR FOUR-FIVE, LUMBAR FIVE-SACRAL ONE;  Surgeon: Charlie Pitter, MD;  Location:  [facility];  Service: Neurosurgery;  Laterality: Right;  right  . LUMBAR LAMINECTOMY/DECOMPRESSION MICRODISCECTOMY Left 12/01/2014   Procedure: Left Lumbar Three-Four, Lumbar Four-Five Laminectomy and Foraminotomy;  Surgeon: Earnie Larsson, MD;  Location: [place] [facility];  Service: Neurosurgery;  Laterality: Left;  . s/p left arm surgury after work accident  1991   "2000# steel fell on it"  . s/p right hand surgury for foreign object  1970's   "piece of wood went in my hand; had to get that out"     reports that he quit smoking about 45 years ago. His smoking use included cigarettes. He has a 27.00 pack-year smoking history. He has quit using smokeless tobacco. He reports that he does not drink alcohol or use drugs.  Allergies  Allergen Reactions  . Crestor [Rosuvastatin Calcium] Other (See Comments)    Urination of blood  . Latex Rash and Other (See Comments)    Gloves cause welts on hands!!    Family History  Problem Relation Age of Onset  . Diabetes Mother   . Diabetes Sister   . Heart disease Sister        2 sister died with heart disease  . Coronary artery disease Other 52       male, first degree relative  . Diabetes Other        1st degree relative  . Diabetes Sister   . Lung cancer Sister        deceased  . Diabetes Sister     Prior to Admission medications   Medication Sig Start Date End Date Taking? Authorizing Provider  albuterol (PROAIR HFA) 108 (90 Base) MCG/ACT inhaler Inhale 2 puffs into the lungs every 6 (six) hours as needed for wheezing or shortness of breath. 02/05/18   Biagio Borg, MD  allopurinol (ZYLOPRIM) 300 MG tablet TAKE 1 TABLET BY MOUTH  DAILY 01/20/19   Biagio Borg, MD  atorvastatin (LIPITOR) 80 MG tablet TAKE 1 TABLET BY MOUTH  DAILY 01/23/19   Biagio Borg, MD  benzonatate (TESSALON PERLES) 100 MG capsule 1-2 tab by mouth every 6 hrs as needed for cough 01/31/18   Biagio Borg, MD  Blood Glucose Monitoring Suppl (ONE TOUCH ULTRA 2) w/Device KIT Use  as directed 10/12/15   Biagio Borg, MD  carvedilol (COREG) 25 MG tablet TAKE 1 TABLET BY MOUTH TWO  TIMES DAILY 01/20/19   Biagio Borg, MD  Cholecalciferol (VITAMIN D) 1000 UNITS capsule Take 1,000 Units by mouth 2 (two) times daily. Reported on 02/15/2015 07/31/13   [provider]  dextromethorphan-guaiFENesin (MUCINEX DM) 30-600 MG 12hr tablet Take 1 tablet by mouth 2 (two) times daily. 01/17/18   Kathie Dike, MD  donepezil (ARICEPT) 5 MG tablet TAKE 1 TABLET BY MOUTH  DAILY 01/23/19   Biagio Borg, MD  fluticasone [facility]) 50 MCG/ACT nasal spray Place 2 sprays into both nostrils daily. 07/19/18   Biagio Borg, MD  furosemide (LASIX) 40 MG tablet Take 2 tablets (80  mg total) by mouth 2 (two) times daily. 01/31/18   Biagio Borg, MD  gabapentin (NEURONTIN) 300 MG capsule TAKE 2 CAPSULES BY MOUTH 3  TIMES DAILY 01/20/19   Biagio Borg, MD  glipiZIDE (GLUCOTROL XL) 10 MG 24 hr tablet TAKE 1 TABLET BY MOUTH  DAILY WITH BREAKFAST 01/20/19   Biagio Borg, MD  glucose blood (ONE TOUCH ULTRA TEST) test strip CHECK BLOOD SUGAR TWO TIMES DAILY 05/24/17   Biagio Borg, MD  HYDROcodone-acetaminophen [facility]) 10-325 MG tablet Take 1 tablet by mouth daily as needed. 01/01/19   Biagio Borg, MD  iron polysaccharides (NU-IRON) 150 MG capsule Take 1 capsule (150 mg total) by mouth daily. 01/02/19   Biagio Borg, MD  lisinopril (ZESTRIL) 2.5 MG tablet TAKE 1 TABLET BY MOUTH  DAILY 01/20/19   Biagio Borg, MD  metFORMIN (GLUCOPHAGE) 500 MG tablet TAKE 4 TABLETS BY MOUTH  DAILY 01/20/19   Biagio Borg, MD  Multiple Vitamin (MULTIVITAMIN WITH MINERALS) TABS tablet Take 1 tablet by mouth every morning.    [provider]  [facility] DELICA LANCETS 71H MISC USE AS DIRECTED THREE TIMES DAILY TO CHECK BLOOD SUGAR 05/24/17   Biagio Borg, MD  oxybutynin (DITROPAN-XL) 10 MG 24 hr tablet TAKE 1 TABLET BY MOUTH AT  BEDTIME 01/23/19   Biagio Borg, MD  pantoprazole (PROTONIX) 40 MG tablet TAKE 1  TABLET (40 MG TOTAL) BY MOUTH DAILY. TAKE 30 MINUTES BEFORE A MEAL. 01/09/19   Doran Stabler, MD  potassium chloride SA (KLOR-CON) 20 MEQ tablet TAKE 1 TABLET BY MOUTH  EVERY EVENING 01/20/19   Biagio Borg, MD  sitaGLIPtin (JANUVIA) 100 MG tablet Take 1 tablet (100 mg total) by mouth daily. 01/31/18   Biagio Borg, MD  tamsulosin (FLOMAX) 0.4 MG CAPS capsule TAKE 1 CAPSULE BY MOUTH  TWICE DAILY 01/20/19   Biagio Borg, MD  tiZANidine (ZANAFLEX) 4 MG tablet TAKE 1 TABLET BY MOUTH   EVERY 6 HOURS AS NEEDED FOR MUSCLE SPASMS. 09/12/18   Biagio Borg, MD  traZODone (DESYREL) 50 MG tablet Take 0.5-1 tablets (25-50 mg total) by mouth at bedtime as needed for sleep. 07/09/18   Biagio Borg, MD  warfarin (COUMADIN) 5 MG tablet TAKE 1/2 TO 1 TABLET BY MOUTH DAILY AS DIRECTED BY COUMADIN CLINIC 12/16/18   Evans Lance, MD    Physical Exam: Vitals:   02/01/19 1000 02/01/19 1030 02/01/19 1100 02/01/19 1130  BP: 107/64 (!) 111/56 (!) 112/57 (!) 123/59  Pulse: (!) 58 (!) 59 60 (!) 59  Resp: '19 19 19 17  ' Temp:      TempSrc:      SpO2: 100% 96% 95% 100%  Weight:      Height:        Constitutional: NAD, calm, comfortable Vitals:   02/01/19 1000 02/01/19 1030 02/01/19 1100 02/01/19 1130  BP: 107/64 (!) 111/56 (!) 112/57 (!) 123/59  Pulse: (!) 58 (!) 59 60 (!) 59  Resp: '19 19 19 17  ' Temp:      TempSrc:      SpO2: 100% 96% 95% 100%  Weight:      Height:       Eyes: lids and conjunctivae normal ENMT: Mucous membranes are moist.  Neck: normal, supple Respiratory: clear to auscultation bilaterally. Normal respiratory effort. No accessory muscle use.  Currently on 2 L nasal cannula oxygen. Cardiovascular: Regular rate and rhythm, no murmurs. No extremity  edema. Abdomen: no tenderness, no distention. Bowel sounds positive.  Musculoskeletal:  No joint deformity upper and lower extremities.   Skin: no rashes, lesions, ulcers.  Psychiatric: Normal judgment and insight. Alert and oriented x 3.  Normal mood.   Labs on Admission: I have personally reviewed following labs and imaging studies  CBC: Recent Labs  Lab 02/01/19 0941  WBC 5.3  NEUTROABS 3.9  HGB 9.4*  HCT 32.2*  MCV 83.0  PLT 503   Basic Metabolic Panel: Recent Labs  Lab 02/01/19 0941  NA 138  K 4.6  CL 95*  CO2 29  GLUCOSE 109*  BUN 52*  CREATININE 2.43*  CALCIUM 8.9   GFR: Estimated Creatinine Clearance: 34.6 mL/min (A) (by C-G formula based on SCr of 2.43 mg/dL (H)). Liver Function Tests: Recent Labs  Lab 02/01/19 0941  AST 49*  ALT 38  ALKPHOS 72  BILITOT 0.5  PROT 7.7  ALBUMIN 4.5   No results for input(s): LIPASE, AMYLASE in the last 168 hours. No results for input(s): AMMONIA in the last 168 hours. Coagulation Profile: Recent Labs  Lab 01/28/19 0854 02/01/19 0951  INR 2.3 1.7*   Cardiac Enzymes: No results for input(s): CKTOTAL, CKMB, CKMBINDEX, TROPONINI in the last 168 hours. BNP (last 3 results) No results for input(s): PROBNP in the last 8760 hours. HbA1C: No results for input(s): HGBA1C in the last 72 hours. CBG: No results for input(s): GLUCAP in the last 168 hours. Lipid Profile: Recent Labs    02/01/19 0941  TRIG 95   Thyroid Function Tests: No results for input(s): TSH, T4TOTAL, FREET4, T3FREE, THYROIDAB in the last 72 hours. Anemia Panel: Recent Labs    02/01/19 0941  FERRITIN 31   Urine analysis:    Component Value Date/Time   COLORURINE YELLOW 01/16/2018 1400   APPEARANCEUR CLEAR 01/16/2018 1400   LABSPEC 1.014 01/16/2018 1400   PHURINE 5.0 01/16/2018 1400   GLUCOSEU NEGATIVE 01/16/2018 1400   GLUCOSEU NEGATIVE 09/24/2017 1112   HGBUR NEGATIVE 01/16/2018 1400   BILIRUBINUR neg 01/01/2019 1436   KETONESUR NEGATIVE 01/16/2018 1400   PROTEINUR Negative 01/01/2019 1436   PROTEINUR NEGATIVE 01/16/2018 1400   UROBILINOGEN 0.2 01/01/2019 1436   UROBILINOGEN 0.2 09/24/2017 1112   NITRITE neg 01/01/2019 1436   NITRITE NEGATIVE 01/16/2018 1400    LEUKOCYTESUR Negative 01/01/2019 1436    Radiological Exams on Admission: DG Chest Port 1 View  Result Date: 02/01/2019 CLINICAL DATA:  Shortness of breath. Additional history provided: Fever, cough and shortness of breath as well as generalized weakness. COVID test pending. EXAM: PORTABLE CHEST 1 VIEW COMPARISON:  Chest radiograph 01/16/2018 FINDINGS: Prior median sternotomy. Redemonstrated left chest dual lead pacer device. Overlying cardiac monitoring leads. Cardiomegaly.  Aortic atherosclerosis. Shallow inspiration radiograph. Mild bibasilar opacities may reflect incomplete atelectasis or developing pneumonia. No sizable pleural effusion or evidence of pneumothorax. No acute bony abnormality. IMPRESSION: Shallow inspiration radiograph. Bibasilar opacities may reflect incomplete atelectasis and/or pneumonia. Cardiomegaly.  Aortic atherosclerosis. Electronically Signed   By: Kellie Simmering DO   On: 02/01/2019 10:03    EKG: Independently reviewed. Aflutter 64bpm.  Assessment/Plan Active Problems:   Acute hypoxemic respiratory failure due to COVID-19 [facility])    Acute hypoxemic respiratory failure and fever secondary to COVID-19 pneumonia -Start on dexamethasone and remdesivir -Wean oxygen as tolerated -Antitussives and breathing treatments as needed -Follow inflammatory markers -Isolation precautions  AKI with baseline creatinine 1.1-1.3 -Appears to be related to some dehydration -Start IV fluid with normal saline -Monitor repeat  BMP -Hold home Lasix/lisinopril and any other nephrotoxic agents  Atrial fibrillation/flutter on anticoagulation with Coumadin status post PPM -Currently with subtherapeutic INR, pharmacy consult for Coumadin management -Rate controlled on telemetry -Continue Coreg  CAD with prior CABG -Continue Coreg and statin  Type 2 diabetes-currently without hyperglycemia -Hold home agents -SSI and carb modified diet  Hypertension-controlled -Hold home lisinopril  and Lasix -Continue Coreg for now and monitor  Dyslipidemia -Continue atorvastatin  BPH -Continue tamsulosin  Chronic gout -No active flare noted currently, continue allopurinol  Peripheral neuropathy -Continue gabapentin  GERD -Continue PPI  DVT prophylaxis: Coumadin Code Status: DNR Family Communication: Discussed with son Dontaye Hur on phone Disposition Plan: Admit for treatment of Covid pneumonia with hypoxemia Consults called: None Admission status: Inpatient, telemetry   Blayke Pinera D Saranda Legrande DO [facility]  If 7PM-7AM, please contact night-coverage [email or web address]  02/01/2019, 12:29 PM

## 2019-02-01 NOTE — ED Notes (Signed)
Pharmacy called about ordered medications. Medications to be sent to ED.

## 2019-02-01 NOTE — Progress Notes (Signed)
ANTICOAGULATION CONSULT NOTE - Initial Up Consult   Pharmacy Consult for warfarin dosing  Indication: atrial fibrillation   Allergies  Allergen Reactions  . Crestor [Rosuvastatin Calcium] Other (See Comments)    Urination of blood  . Latex Rash and Other (See Comments)    Gloves cause welts on hands!!      Patient Measurements: Last Weight  Most recent update: 02/01/2019  9:30 AM   Weight  99.3 kg (218 lb 14.7 oz)           Body mass index is 28.11 kg/m. [name]               Temp: 102.3 F (39.1 C) (01/02 0929) Temp Source: Oral (01/02 0929) BP: 117/74 (01/02 1230) Pulse Rate: 63 (01/02 1230)  Labs: Recent Labs    02/01/19 0941 02/01/19 0951  HGB 9.4*  --   HCT 32.2*  --   PLT 161  --   LABPROT  --  19.5*  INR  --  1.7*  CREATININE 2.43*  --     Estimated Creatinine Clearance: 34.6 mL/min (A) (by C-G formula based on SCr of 2.43 mg/dL (H)).     Medications:  (Not in a hospital admission)  Scheduled:  . allopurinol  300 mg Oral Daily  . vitamin C  500 mg Oral Daily  . atorvastatin  80 mg Oral Daily  . benzonatate  100 mg Oral TID  . carvedilol  25 mg Oral BID  . cholecalciferol  1,000 Units Oral BID  . dexamethasone (DECADRON) injection  6 mg Intravenous Q24H  . dextromethorphan-guaiFENesin  1 tablet Oral BID  . donepezil  5 mg Oral Daily  . fluticasone  2 spray Each Nare Daily  . gabapentin  300 mg Oral TID  . insulin aspart  0-5 Units Subcutaneous QHS  . insulin aspart  0-9 Units Subcutaneous TID WC  . Ipratropium-Albuterol  1 puff Inhalation Q6H  . iron polysaccharides  150 mg Oral Daily  . multivitamin with minerals  1 tablet Oral q morning - 10a  . oxybutynin  10 mg Oral QHS  . pantoprazole  40 mg Oral Daily  . tamsulosin  0.4 mg Oral Daily  . warfarin  5 mg Oral q1800  . Warfarin - Pharmacist Dosing Inpatient   Does not apply q1800  . zinc sulfate  220 mg Oral Daily   Infusions:  . sodium chloride    . remdesivir 200 mg in  sodium chloride 0.9% 250 mL IVPB     Followed by  . [START ON 02/02/2019] remdesivir 100 mg in NS 100 mL     PRN: acetaminophen, HYDROcodone-acetaminophen, ondansetron **OR** ondansetron (ZOFRAN) IV, traZODone Anti-infectives (From admission, onward)   Start     Dose/Rate Route Frequency Ordered Stop   02/02/19 1000  remdesivir 100 mg in sodium chloride 0.9 % 100 mL IVPB     100 mg 200 mL/hr over 30 Minutes Intravenous Daily 02/01/19 1257 02/06/19 0959   02/01/19 1300  remdesivir 200 mg in sodium chloride 0.9% 250 mL IVPB     200 mg 580 mL/hr over 30 Minutes Intravenous Once 02/01/19 1257        Goal of Therapy:  INR 2-3 Monitor platelets by anticoagulation protocol: Yes    Prior to Admission Warfarin Dosing:  [name] takes 5mg  of warfarin daily alternating with 2.5mg  of warfarin daily   Admit INR was 1.7 Lab Results  Component Value Date   INR 1.7 (H)  02/01/2019   INR 2.3 01/28/2019   INR 2.4 12/16/2018    Assessment: [name] a 74 y.o. male requires anticoagulation with warfarin for the indication of  atrial fibrillation. Warfarin will be initiated inpatient following pharmacy protocol per pharmacy consult. Patient most recent blood work is as follows: CBC Latest Ref Rng & Units 02/01/2019 01/01/2019 08/10/2018  WBC 4.0 - 10.5 K/uL 5.3 6.8 5.3  Hemoglobin 13.0 - 17.0 g/dL 9.4(L) 9.1(L) 8.8(L)  Hematocrit 39.0 - 52.0 % 32.2(L) 28.9(L) 30.2(L)  Platelets 150 - 400 K/uL 161 194.0 187     Plan: Warfarin 5mg  po daily Monitor CBC MWF with am labs   Monitor INR daily Monitor for signs and symptoms of bleeding   Donna Christen Darrie Macmillan, PharmD, MBA, BCGP Clinical Pharmacist

## 2019-02-02 DIAGNOSIS — N179 Acute kidney failure, unspecified: Secondary | ICD-10-CM

## 2019-02-02 DIAGNOSIS — I442 Atrioventricular block, complete: Secondary | ICD-10-CM

## 2019-02-02 DIAGNOSIS — I482 Chronic atrial fibrillation, unspecified: Secondary | ICD-10-CM

## 2019-02-02 DIAGNOSIS — N1831 Chronic kidney disease, stage 3a: Secondary | ICD-10-CM

## 2019-02-02 LAB — GLUCOSE, CAPILLARY
Glucose-Capillary: 148 mg/dL — ABNORMAL HIGH (ref 70–99)
Glucose-Capillary: 186 mg/dL — ABNORMAL HIGH (ref 70–99)

## 2019-02-02 LAB — PROTIME-INR
INR: 1.9 — ABNORMAL HIGH (ref 0.8–1.2)
INR: 2 — ABNORMAL HIGH (ref 0.8–1.2)
Prothrombin Time: 22 seconds — ABNORMAL HIGH (ref 11.4–15.2)
Prothrombin Time: 22.5 seconds — ABNORMAL HIGH (ref 11.4–15.2)

## 2019-02-02 LAB — CBC WITH DIFFERENTIAL/PLATELET
Abs Immature Granulocytes: 0.02 10*3/uL (ref 0.00–0.07)
Basophils Absolute: 0 10*3/uL (ref 0.0–0.1)
Basophils Relative: 0 %
Eosinophils Absolute: 0 10*3/uL (ref 0.0–0.5)
Eosinophils Relative: 0 %
HCT: 32.1 % — ABNORMAL LOW (ref 39.0–52.0)
Hemoglobin: 9.3 g/dL — ABNORMAL LOW (ref 13.0–17.0)
Immature Granulocytes: 0 %
Lymphocytes Relative: 13 %
Lymphs Abs: 0.6 10*3/uL — ABNORMAL LOW (ref 0.7–4.0)
MCH: 24.2 pg — ABNORMAL LOW (ref 26.0–34.0)
MCHC: 29 g/dL — ABNORMAL LOW (ref 30.0–36.0)
MCV: 83.4 fL (ref 80.0–100.0)
Monocytes Absolute: 0.4 10*3/uL (ref 0.1–1.0)
Monocytes Relative: 9 %
Neutro Abs: 3.7 10*3/uL (ref 1.7–7.7)
Neutrophils Relative %: 78 %
Platelets: 139 10*3/uL — ABNORMAL LOW (ref 150–400)
RBC: 3.85 MIL/uL — ABNORMAL LOW (ref 4.22–5.81)
RDW: 18.6 % — ABNORMAL HIGH (ref 11.5–15.5)
WBC: 4.8 10*3/uL (ref 4.0–10.5)
nRBC: 0 % (ref 0.0–0.2)

## 2019-02-02 LAB — COMPREHENSIVE METABOLIC PANEL
ALT: 32 U/L (ref 0–44)
AST: 47 U/L — ABNORMAL HIGH (ref 15–41)
Albumin: 3.6 g/dL (ref 3.5–5.0)
Alkaline Phosphatase: 58 U/L (ref 38–126)
Anion gap: 13 (ref 5–15)
BUN: 45 mg/dL — ABNORMAL HIGH (ref 8–23)
CO2: 24 mmol/L (ref 22–32)
Calcium: 8.6 mg/dL — ABNORMAL LOW (ref 8.9–10.3)
Chloride: 103 mmol/L (ref 98–111)
Creatinine, Ser: 1.24 mg/dL (ref 0.61–1.24)
GFR calc Af Amer: >60 mL/min (ref >60)
GFR calc non Af Amer: 58 mL/min — ABNORMAL LOW (ref >60)
Glucose, Bld: 165 mg/dL — ABNORMAL HIGH (ref 70–99)
Potassium: 4.6 mmol/L (ref 3.5–5.1)
Sodium: 140 mmol/L (ref 135–145)
Total Bilirubin: 0.7 mg/dL (ref 0.3–1.2)
Total Protein: 6.6 g/dL (ref 6.5–8.1)

## 2019-02-02 LAB — D-DIMER, QUANTITATIVE: D-Dimer, Quant: 0.52 ug/mL-FEU — ABNORMAL HIGH (ref 0.00–0.50)

## 2019-02-02 LAB — FERRITIN: Ferritin: 50 ng/mL (ref 24–336)

## 2019-02-02 LAB — C-REACTIVE PROTEIN: CRP: 6.7 mg/dL — ABNORMAL HIGH (ref <1.0)

## 2019-02-02 LAB — MAGNESIUM: Magnesium: 2.3 mg/dL (ref 1.7–2.4)

## 2019-02-02 NOTE — Progress Notes (Signed)
PROGRESS NOTE  [name] [id number] DOB: Aug 14, 1945 DOA: 02/01/2019 PCP: Biagio Borg, MD  Brief History:  74 year old male with a history of atrial fibrillation, complete heart block status post permanent pacemaker, coronary artery disease status post stenting and CABG, hypertension, hyperlipidemia, diabetes mellitus type 2, diastolic CHF presenting with approximately 1 week history of fever, cough, shortness of breath, and fatigue.  The patient is a difficult historian as he seems quite frustrated that he has to be hospitalized for his current medical condition.  Nevertheless, the patient ultimately endorses that he has had some worsening shortness of breath, coughing, and fatigue over the past several days.  He denies any nausea, vomiting, abdominal pain, headache, neck pain.  He has had some loose stools without hematochezia or melena. In the emergency department, the patient was noted to have oxygen saturation 84% on room air.  WBC 5.3, hemoglobin 9.4, platelets 161,000.  The patient has elevated inflammatory markers.  Chest x-ray showed bibasilar opacities.  Covid 19 test was positive.  Patient was started on remdesivir and IV steroids.  Assessment/Plan: Acute respiratory failure with hypoxia -Secondary to COVID-19 pneumonia -CRP 1.3 -Ferritin 31 -D-dimer 0.36 -PCT<0.10 -Lactic acid 1.1>>>1.3 -LDH 213 -Fibrinogen 316  Chronic atrial fibrillation -Rate controlled -Continue warfarin -Continue carvedilol  Acute on chronic renal failure--CKD stage 3a -baseline creatinine 0.9-1.2 -presented with serum creatinine 2.43 -judicious IVF  Complete heart block -Status post PPM -Patient follows Dr. Cristopher Peru  Coronary artery disease -Status post CABG -Denies any chest pain -Personally reviewed EKG--atrial flutter with LBBB -Continue carvedilol and Lipitor  Diabetes mellitus type 2 -NovoLog sliding scale -Holding Januvia, glipizide, metformin -Check  hemoglobin A1c  Hyperlipidemia -Continue statin  Cognitive impairment -Continue Aricept  BPH -continue tamulosin      Disposition Plan:   Home in 2-3 days  Family Communication:   Spouse updated 02/02/19  Consultants:  none  Code Status:   DNR  DVT Prophylaxis:  coumadin   Procedures: As Listed in Progress Note Above  Antibiotics: None       Subjective: Pt still has some dyspnea on exertion, but it is improving.  He denies cp, n/v/ abd pain.  Had one loose stool last night without blood or black.  Objective: Vitals:   02/01/19 1818 02/01/19 1900 02/01/19 2100 02/02/19 0500  BP: 104/71 125/74 (!) 111/59 (!) 130/94  Pulse: 62 68 60 62  Resp: 17 18 17 20   Temp: 100.2 F (37.9 C) 99.2 F (37.3 C) 98.6 F (37 C) (!) 97.4 F (36.3 C)  TempSrc: Oral Oral Oral Oral  SpO2: 94% 97% 100% 100%  Weight:      Height:        Intake/Output Summary (Last 24 hours) at 02/02/2019 0919 Last data filed at 02/02/2019 0500 Gross per 24 hour  Intake 2484.98 ml  Output 501 ml  Net 1983.98 ml   Weight change:  Exam:   General:  Pt is alert, follows commands appropriately, not in acute distress  HEENT: No icterus, No thrush, No neck mass, [place]/AT  Cardiovascular: RRR, S1/S2, no rubs, no gallops  Respiratory: bibasilar rales. No wheeze  Abdomen: Soft/+BS, non tender, non distended, no guarding  Extremities: No edema, No lymphangitis, No petechiae, No rashes, no synovitis   Data Reviewed: I have personally reviewed following labs and imaging studies Basic Metabolic Panel: Recent Labs  Lab 02/01/19 0941  NA 138  K 4.6  CL 95*  CO2 29  GLUCOSE 109*  BUN 52*  CREATININE 2.43*  CALCIUM 8.9   Liver Function Tests: Recent Labs  Lab 02/01/19 0941  AST 49*  ALT 38  ALKPHOS 72  BILITOT 0.5  PROT 7.7  ALBUMIN 4.5   No results for input(s): LIPASE, AMYLASE in the last 168 hours. No results for input(s): AMMONIA in the last 168 hours. Coagulation  Profile: Recent Labs  Lab 01/28/19 0854 02/01/19 0951  INR 2.3 1.7*   CBC: Recent Labs  Lab 02/01/19 0941  WBC 5.3  NEUTROABS 3.9  HGB 9.4*  HCT 32.2*  MCV 83.0  PLT 161   Cardiac Enzymes: No results for input(s): CKTOTAL, CKMB, CKMBINDEX, TROPONINI in the last 168 hours. BNP: Invalid input(s): POCBNP CBG: Recent Labs  Lab 02/01/19 1714 02/01/19 2056 02/02/19 0744  GLUCAP 103* 144* 148*   HbA1C: No results for input(s): HGBA1C in the last 72 hours. Urine analysis:    Component Value Date/Time   COLORURINE YELLOW 01/16/2018 1400   APPEARANCEUR CLEAR 01/16/2018 1400   LABSPEC 1.014 01/16/2018 1400   PHURINE 5.0 01/16/2018 1400   GLUCOSEU NEGATIVE 01/16/2018 1400   GLUCOSEU NEGATIVE 09/24/2017 1112   HGBUR NEGATIVE 01/16/2018 1400   BILIRUBINUR neg 01/01/2019 1436   KETONESUR NEGATIVE 01/16/2018 1400   PROTEINUR Negative 01/01/2019 1436   PROTEINUR NEGATIVE 01/16/2018 1400   UROBILINOGEN 0.2 01/01/2019 1436   UROBILINOGEN 0.2 09/24/2017 1112   NITRITE neg 01/01/2019 1436   NITRITE NEGATIVE 01/16/2018 1400   LEUKOCYTESUR Negative 01/01/2019 1436   Sepsis Labs: @LABRCNTIP (procalcitonin:4,lacticidven:4) ) Recent Results (from the past 240 hour(s))  Blood Culture (routine x 2)     Status: None (Preliminary result)   Collection Time: 02/01/19  9:41 AM   Specimen: Right Antecubital; Blood  Result Value Ref Range Status   Specimen Description   Final    RIGHT ANTECUBITAL BOTTLES DRAWN AEROBIC AND ANAEROBIC   Special Requests   Final    Blood Culture adequate volume Performed at [facility], [address] [place], [place] [address]    Culture PENDING  Incomplete   Report Status PENDING  Incomplete  Blood Culture (routine x 2)     Status: None (Preliminary result)   Collection Time: 02/01/19  9:46 AM   Specimen: BLOOD RIGHT HAND  Result Value Ref Range Status   Specimen Description   Final    BLOOD RIGHT HAND BOTTLES DRAWN AEROBIC AND ANAEROBIC    Special Requests   Final    Blood Culture adequate volume Performed at [facility], [address] [place], [place] [address]    Culture PENDING  Incomplete   Report Status PENDING  Incomplete     Scheduled Meds: . allopurinol  300 mg Oral Daily  . vitamin C  500 mg Oral Daily  . atorvastatin  80 mg Oral Daily  . benzonatate  100 mg Oral TID  . carvedilol  25 mg Oral BID  . cholecalciferol  1,000 Units Oral BID  . dexamethasone (DECADRON) injection  6 mg Intravenous Q24H  . dextromethorphan-guaiFENesin  1 tablet Oral BID  . donepezil  5 mg Oral Daily  . fluticasone  2 spray Each Nare Daily  . gabapentin  300 mg Oral TID  . insulin aspart  0-5 Units Subcutaneous QHS  . insulin aspart  0-9 Units Subcutaneous TID WC  . Ipratropium-Albuterol  1 puff Inhalation Q6H  . iron polysaccharides  150 mg Oral Daily  . multivitamin with minerals  1 tablet Oral q morning - 10a  .  oxybutynin  10 mg Oral QHS  . pantoprazole  40 mg Oral Daily  . tamsulosin  0.4 mg Oral Daily  . warfarin  5 mg Oral q1800  . Warfarin - Pharmacist Dosing Inpatient   Does not apply q1800  . zinc sulfate  220 mg Oral Daily   Continuous Infusions: . remdesivir 100 mg in NS 100 mL      Procedures/Studies: DG Chest Port 1 View  Result Date: 02/01/2019 CLINICAL DATA:  Shortness of breath. Additional history provided: Fever, cough and shortness of breath as well as generalized weakness. COVID test pending. EXAM: PORTABLE CHEST 1 VIEW COMPARISON:  Chest radiograph 01/16/2018 FINDINGS: Prior median sternotomy. Redemonstrated left chest dual lead pacer device. Overlying cardiac monitoring leads. Cardiomegaly.  Aortic atherosclerosis. Shallow inspiration radiograph. Mild bibasilar opacities may reflect incomplete atelectasis or developing pneumonia. No sizable pleural effusion or evidence of pneumothorax. No acute bony abnormality. IMPRESSION: Shallow inspiration radiograph. Bibasilar opacities may reflect incomplete  atelectasis and/or pneumonia. Cardiomegaly.  Aortic atherosclerosis. Electronically Signed   By: Kellie Simmering DO   On: 02/01/2019 10:03    Orson Eva, DO  [facility] Pager [phone]  If 7PM-7AM, please contact night-coverage [email or web address] Password [email or web address] 02/02/2019, 9:19 AM   LOS: 1 day

## 2019-02-02 NOTE — Progress Notes (Signed)
ANTICOAGULATION CONSULT NOTE - Initial Up Consult   Pharmacy Consult for warfarin dosing  Indication: atrial fibrillation   Allergies  Allergen Reactions  . Crestor [Rosuvastatin Calcium] Other (See Comments)    Urination of blood  . Latex Rash and Other (See Comments)    Gloves cause welts on hands!!      Patient Measurements: Last Weight  Most recent update: 02/01/2019  9:30 AM   Weight  99.3 kg (218 lb 14.7 oz)           Body mass index is 28.11 kg/m. REID REGAS               Temp: 97.4 F (36.3 C) (01/03 0500) Temp Source: Oral (01/03 0500) BP: 130/94 (01/03 0500) Pulse Rate: 62 (01/03 0500)  Labs: Recent Labs    02/01/19 0941 02/01/19 0951 02/02/19 0812 02/02/19 0927  HGB 9.4*  --  9.3*  --   HCT 32.2*  --  32.1*  --   PLT 161  --  139*  --   LABPROT  --  19.5*  --  22.5*  INR  --  1.7*  --  2.0*  CREATININE 2.43*  --  1.24  --     Estimated Creatinine Clearance: 67.8 mL/min (by C-G formula based on SCr of 1.24 mg/dL).     Medications:  Medications Prior to Admission  Medication Sig Dispense Refill Last Dose  . albuterol (PROAIR HFA) 108 (90 Base) MCG/ACT inhaler Inhale 2 puffs into the lungs every 6 (six) hours as needed for wheezing or shortness of breath. 3 Inhaler 3 Past Week at Unknown time  . allopurinol (ZYLOPRIM) 300 MG tablet TAKE 1 TABLET BY MOUTH  DAILY 90 tablet 2 01/31/2019 at Unknown time  . atorvastatin (LIPITOR) 80 MG tablet TAKE 1 TABLET BY MOUTH  DAILY 90 tablet 3 01/31/2019 at Unknown time  . Blood Glucose Monitoring Suppl (ONE TOUCH ULTRA 2) w/Device KIT Use as directed 1 each 3   . carvedilol (COREG) 25 MG tablet TAKE 1 TABLET BY MOUTH TWO  TIMES DAILY 180 tablet 2 01/31/2019 at 2000  . Cholecalciferol (VITAMIN D) 1000 UNITS capsule Take 1,000 Units by mouth 2 (two) times daily. Reported on 02/15/2015   01/31/2019 at Unknown time  . dextromethorphan-guaiFENesin (MUCINEX DM) 30-600 MG 12hr tablet Take 1 tablet by mouth 2 (two) times  daily. 30 tablet 0 01/31/2019 at Unknown time  . donepezil (ARICEPT) 5 MG tablet TAKE 1 TABLET BY MOUTH  DAILY 90 tablet 3 01/31/2019 at Unknown time  . fluticasone (FLONASE) 50 MCG/ACT nasal spray Place 2 sprays into both nostrils daily. 48 g 5 01/31/2019 at Unknown time  . furosemide (LASIX) 40 MG tablet Take 2 tablets (80 mg total) by mouth 2 (two) times daily. 720 tablet 3 01/31/2019 at Unknown time  . gabapentin (NEURONTIN) 300 MG capsule TAKE 2 CAPSULES BY MOUTH 3  TIMES DAILY 540 capsule 2 01/31/2019 at Unknown time  . glipiZIDE (GLUCOTROL XL) 10 MG 24 hr tablet TAKE 1 TABLET BY MOUTH  DAILY WITH BREAKFAST 90 tablet 2 01/31/2019 at Unknown time  . glucose blood (ONE TOUCH ULTRA TEST) test strip CHECK BLOOD SUGAR TWO TIMES DAILY 100 each 3   . HYDROcodone-acetaminophen (NORCO) 10-325 MG tablet Take 1 tablet by mouth daily as needed. 30 tablet 0 01/31/2019 at Unknown time  . iron polysaccharides (NU-IRON) 150 MG capsule Take 1 capsule (150 mg total) by mouth daily. 90 capsule 1 Past Week at Unknown time  .  lisinopril (ZESTRIL) 2.5 MG tablet TAKE 1 TABLET BY MOUTH  DAILY 90 tablet 2 01/31/2019 at Unknown time  . metFORMIN (GLUCOPHAGE) 500 MG tablet TAKE 4 TABLETS BY MOUTH  DAILY 360 tablet 2 01/31/2019 at Unknown time  . Multiple Vitamin (MULTIVITAMIN WITH MINERALS) TABS tablet Take 1 tablet by mouth every morning.   01/31/2019 at Unknown time  . ONETOUCH DELICA LANCETS 91Y MISC USE AS DIRECTED THREE TIMES DAILY TO CHECK BLOOD SUGAR 300 each 0   . oxybutynin (DITROPAN-XL) 10 MG 24 hr tablet TAKE 1 TABLET BY MOUTH AT  BEDTIME 90 tablet 3 01/31/2019 at Unknown time  . pantoprazole (PROTONIX) 40 MG tablet TAKE 1 TABLET (40 MG TOTAL) BY MOUTH DAILY. TAKE 30 MINUTES BEFORE A MEAL. 90 tablet 0 01/31/2019 at Unknown time  . potassium chloride SA (KLOR-CON) 20 MEQ tablet TAKE 1 TABLET BY MOUTH  EVERY EVENING 90 tablet 2 01/31/2019 at Unknown time  . sitaGLIPtin (JANUVIA) 100 MG tablet Take 1 tablet (100 mg total) by mouth daily.  90 tablet 3 01/31/2019 at Unknown time  . tamsulosin (FLOMAX) 0.4 MG CAPS capsule TAKE 1 CAPSULE BY MOUTH  TWICE DAILY 180 capsule 2 01/31/2019 at Unknown time  . tiZANidine (ZANAFLEX) 4 MG tablet TAKE 1 TABLET BY MOUTH   EVERY 6 HOURS AS NEEDED FOR MUSCLE SPASMS. 180 tablet 2 01/31/2019 at Unknown time  . warfarin (COUMADIN) 5 MG tablet TAKE 1/2 TO 1 TABLET BY MOUTH DAILY AS DIRECTED BY COUMADIN CLINIC (Patient taking differently: 5 mg daily. ) 90 tablet 1 01/31/2019 at 0900  . benzonatate (TESSALON PERLES) 100 MG capsule 1-2 tab by mouth every 6 hrs as needed for cough (Patient not taking: Reported on 02/01/2019) 60 capsule 1 Not Taking at Unknown time  . traZODone (DESYREL) 50 MG tablet Take 0.5-1 tablets (25-50 mg total) by mouth at bedtime as needed for sleep. (Patient not taking: Reported on 02/01/2019) 90 tablet 1 Not Taking at Unknown time   Scheduled:  . allopurinol  300 mg Oral Daily  . vitamin C  500 mg Oral Daily  . atorvastatin  80 mg Oral Daily  . benzonatate  100 mg Oral TID  . carvedilol  25 mg Oral BID  . cholecalciferol  1,000 Units Oral BID  . dexamethasone (DECADRON) injection  6 mg Intravenous Q24H  . dextromethorphan-guaiFENesin  1 tablet Oral BID  . donepezil  5 mg Oral Daily  . fluticasone  2 spray Each Nare Daily  . gabapentin  300 mg Oral TID  . insulin aspart  0-5 Units Subcutaneous QHS  . insulin aspart  0-9 Units Subcutaneous TID WC  . Ipratropium-Albuterol  1 puff Inhalation Q6H  . iron polysaccharides  150 mg Oral Daily  . multivitamin with minerals  1 tablet Oral q morning - 10a  . oxybutynin  10 mg Oral QHS  . pantoprazole  40 mg Oral Daily  . tamsulosin  0.4 mg Oral Daily  . warfarin  5 mg Oral q1800  . Warfarin - Pharmacist Dosing Inpatient   Does not apply q1800  . zinc sulfate  220 mg Oral Daily   Infusions:  . remdesivir 100 mg in NS 100 mL     PRN: acetaminophen, HYDROcodone-acetaminophen, ondansetron **OR** ondansetron (ZOFRAN) IV,  traZODone Anti-infectives (From admission, onward)   Start     Dose/Rate Route Frequency Ordered Stop   02/02/19 1000  remdesivir 100 mg in sodium chloride 0.9 % 100 mL IVPB     100 mg 200  mL/hr over 30 Minutes Intravenous Daily 02/01/19 1257 02/06/19 0959   02/01/19 1300  remdesivir 200 mg in sodium chloride 0.9% 250 mL IVPB     200 mg 580 mL/hr over 30 Minutes Intravenous Once 02/01/19 1257 02/01/19 1556      Goal of Therapy:  INR 2-3 Monitor platelets by anticoagulation protocol: Yes    Prior to Admission Warfarin Dosing:  Elsworth Soho takes 39m of warfarin daily alternating with 2.576mof warfarin daily   Admit INR was 1.7 Lab Results  Component Value Date   INR 2.0 (H) 02/02/2019   INR 1.7 (H) 02/01/2019   INR 2.3 01/28/2019    Assessment: [name] [age].o. male requires anticoagulation with warfarin for the indication of  atrial fibrillation. Warfarin will be initiated inpatient following pharmacy protocol per pharmacy consult. Patient most recent blood work is as follows: CBC Latest Ref Rng & Units 02/02/2019 02/01/2019 01/01/2019  WBC 4.0 - 10.5 K/uL 4.8 5.3 6.8  Hemoglobin 13.0 - 17.0 g/dL 9.3(L) 9.4(L) 9.1(L)  Hematocrit 39.0 - 52.0 % 32.1(L) 32.2(L) 28.9(L)  Platelets 150 - 400 K/uL 139(L) 161 194.0    INR 2.0  Plan: Warfarin 86m286mo daily Monitor CBC MWF with am labs   Monitor INR daily Monitor for signs and symptoms of bleeding   GarDonna Christenffee, PharmD, MBA, BCGP Clinical Pharmacist

## 2019-02-03 DIAGNOSIS — E1140 Type 2 diabetes mellitus with diabetic neuropathy, unspecified: Secondary | ICD-10-CM

## 2019-02-03 DIAGNOSIS — E1165 Type 2 diabetes mellitus with hyperglycemia: Secondary | ICD-10-CM

## 2019-02-03 LAB — COMPREHENSIVE METABOLIC PANEL
ALT: 31 U/L (ref 0–44)
AST: 43 U/L — ABNORMAL HIGH (ref 15–41)
Albumin: 3.6 g/dL (ref 3.5–5.0)
Alkaline Phosphatase: 56 U/L (ref 38–126)
Anion gap: 10 (ref 5–15)
BUN: 37 mg/dL — ABNORMAL HIGH (ref 8–23)
CO2: 28 mmol/L (ref 22–32)
Calcium: 9 mg/dL (ref 8.9–10.3)
Chloride: 103 mmol/L (ref 98–111)
Creatinine, Ser: 0.94 mg/dL (ref 0.61–1.24)
GFR calc Af Amer: >60 mL/min (ref >60)
GFR calc non Af Amer: >60 mL/min (ref >60)
Glucose, Bld: 205 mg/dL — ABNORMAL HIGH (ref 70–99)
Potassium: 5.1 mmol/L (ref 3.5–5.1)
Sodium: 141 mmol/L (ref 135–145)
Total Bilirubin: 0.7 mg/dL (ref 0.3–1.2)
Total Protein: 6.8 g/dL (ref 6.5–8.1)

## 2019-02-03 LAB — FERRITIN: Ferritin: 58 ng/mL (ref 24–336)

## 2019-02-03 LAB — PROTIME-INR
INR: 2.2 — ABNORMAL HIGH (ref 0.8–1.2)
Prothrombin Time: 24.1 seconds — ABNORMAL HIGH (ref 11.4–15.2)

## 2019-02-03 LAB — CBC WITH DIFFERENTIAL/PLATELET
Abs Immature Granulocytes: 0.02 10*3/uL (ref 0.00–0.07)
Basophils Absolute: 0 10*3/uL (ref 0.0–0.1)
Basophils Relative: 0 %
Eosinophils Absolute: 0 10*3/uL (ref 0.0–0.5)
Eosinophils Relative: 0 %
HCT: 33.5 % — ABNORMAL LOW (ref 39.0–52.0)
Hemoglobin: 9.6 g/dL — ABNORMAL LOW (ref 13.0–17.0)
Immature Granulocytes: 0 %
Lymphocytes Relative: 15 %
Lymphs Abs: 0.8 10*3/uL (ref 0.7–4.0)
MCH: 23.9 pg — ABNORMAL LOW (ref 26.0–34.0)
MCHC: 28.7 g/dL — ABNORMAL LOW (ref 30.0–36.0)
MCV: 83.5 fL (ref 80.0–100.0)
Monocytes Absolute: 0.5 10*3/uL (ref 0.1–1.0)
Monocytes Relative: 9 %
Neutro Abs: 4.2 10*3/uL (ref 1.7–7.7)
Neutrophils Relative %: 76 %
Platelets: 137 10*3/uL — ABNORMAL LOW (ref 150–400)
RBC: 4.01 MIL/uL — ABNORMAL LOW (ref 4.22–5.81)
RDW: 18.4 % — ABNORMAL HIGH (ref 11.5–15.5)
WBC: 5.5 10*3/uL (ref 4.0–10.5)
nRBC: 0 % (ref 0.0–0.2)

## 2019-02-03 LAB — C-REACTIVE PROTEIN: CRP: 4.8 mg/dL — ABNORMAL HIGH (ref <1.0)

## 2019-02-03 LAB — GLUCOSE, CAPILLARY
Glucose-Capillary: 156 mg/dL — ABNORMAL HIGH (ref 70–99)
Glucose-Capillary: 191 mg/dL — ABNORMAL HIGH (ref 70–99)
Glucose-Capillary: 162 mg/dL — ABNORMAL HIGH (ref 70–99)
Glucose-Capillary: 270 mg/dL — ABNORMAL HIGH (ref 70–99)

## 2019-02-03 LAB — D-DIMER, QUANTITATIVE: D-Dimer, Quant: 0.43 ug/mL-FEU (ref 0.00–0.50)

## 2019-02-03 LAB — MAGNESIUM: Magnesium: 2.2 mg/dL (ref 1.7–2.4)

## 2019-02-03 MED ORDER — WARFARIN SODIUM 5 MG PO TABS
5.0000 mg | ORAL_TABLET | Freq: Once | ORAL | Status: AC
  Administered 2019-02-03: 5 mg via ORAL
  Filled 2019-02-03: qty 1

## 2019-02-03 NOTE — Progress Notes (Signed)
PROGRESS NOTE  ELAY TRANI [id number] DOB: 03/11/1945 DOA: 02/01/2019 PCP: Biagio Borg, MD  Brief History:  74 year old male with a history of atrial fibrillation, complete heart block status post permanent pacemaker, coronary artery disease status post stenting and CABG, hypertension, hyperlipidemia, diabetes mellitus type 2, diastolic CHF presenting with approximately 1 week history of fever, cough, shortness of breath, and fatigue.  The patient is a difficult historian as he seems quite frustrated that he has to be hospitalized for his current medical condition.  Nevertheless, the patient ultimately endorses that he has had some worsening shortness of breath, coughing, and fatigue over the past several days.  He denies any nausea, vomiting, abdominal pain, headache, neck pain.  He has had some loose stools without hematochezia or melena. In the emergency department, the patient was noted to have oxygen saturation 84% on room air.  WBC 5.3, hemoglobin 9.4, platelets 161,000.  The patient has elevated inflammatory markers.  Chest x-ray showed bibasilar opacities.  Covid 19 test was positive.  Patient was started on remdesivir and IV steroids.  Assessment/Plan: Acute respiratory failure with hypoxia -Secondary to COVID-19 pneumonia -CRP 1.3>>1.8 -Ferritin 31>>>58 -D-dimer 0.36>>>0.43 -PCT<0.10 -Lactic acid 1.1>>>1.3 -LDH 213 -Fibrinogen 316 -stable on 3L.>>>weaned to RA -pt states he does not want to have to drive to [facility] for outpt infusions  Chronic atrial fibrillation -Rate controlled -Continue warfarin -Continue carvedilol  Acute on chronic renal failure--CKD stage 3a -baseline creatinine 0.9-1.2 -presented with serum creatinine 2.43 -judicious IVF--serum creatinine improved to 0.94  Complete heart block -Status post PPM -Patient follows Dr. Cristopher Peru  Coronary artery disease -Status post CABG -Denies any chest pain -Personally reviewed  EKG--atrial flutter with LBBB -Continue carvedilol and Lipitor  Diabetes mellitus type 2 -NovoLog sliding scale -Holding Januvia, glipizide, metformin -01/01/19--hemoglobin A1c--6.8  Hyperlipidemia -Continue statin  Cognitive impairment -Continue Aricept  BPH -continue tamulosin      Disposition Plan:   Home on 02/04/18  Family Communication:   Spouse updated 02/03/19  Consultants:  none  Code Status:   DNR  DVT Prophylaxis:  coumadin   Procedures: As Listed in Progress Note Above  Antibiotics: None   Subjective: He is better spirits today.  Patient denies fevers, chills, headache, chest pain, dyspnea, nausea, vomiting, diarrhea, abdominal pain, dysuria, hematuria, hematochezia, and melena.   Objective: Vitals:   02/02/19 2055 02/02/19 2138 02/03/19 0527 02/03/19 1558  BP:  120/89 (!) 142/87 127/85  Pulse: 68 62 64 62  Resp: 18 16 18 18   Temp:  98.4 F (36.9 C) 98 F (36.7 C)   TempSrc:  Oral Oral   SpO2: 96% 97% 98% 97%  Weight:      Height:        Intake/Output Summary (Last 24 hours) at 02/03/2019 1614 Last data filed at 02/02/2019 1836 Gross per 24 hour  Intake 240 ml  Output 400 ml  Net -160 ml   Weight change:  Exam:   General:  Pt is alert, follows commands appropriately, not in acute distress  HEENT: No icterus, No thrush, No neck mass, [place]/AT  Cardiovascular: RRR, S1/S2, no rubs, no gallops  Respiratory:diminshed breath sounds.  Bibasilar rales.  Abdomen: Soft/+BS, non tender, non distended, no guarding  Extremities: trace nonpitting edema, No lymphangitis, No petechiae, No rashes, no synovitis   Data Reviewed: I have personally reviewed following labs and imaging studies Basic Metabolic Panel: Recent Labs  Lab 02/01/19 0941 02/02/19 0812 02/03/19 [id number]  NA 138 140 141  K 4.6 4.6 5.1  CL 95* 103 103  CO2 29 24 28   GLUCOSE 109* 165* 205*  BUN 52* 45* 37*  CREATININE 2.43* 1.24 0.94  CALCIUM 8.9 8.6* 9.0  MG   --  2.3 2.2   Liver Function Tests: Recent Labs  Lab 02/01/19 0941 02/02/19 0812 02/03/19 0704  AST 49* 47* 43*  ALT 38 32 31  ALKPHOS 72 58 56  BILITOT 0.5 0.7 0.7  PROT 7.7 6.6 6.8  ALBUMIN 4.5 3.6 3.6   No results for input(s): LIPASE, AMYLASE in the last 168 hours. No results for input(s): AMMONIA in the last 168 hours. Coagulation Profile: Recent Labs  Lab 01/28/19 0854 02/01/19 0951 02/02/19 0812 02/02/19 0927 02/03/19 0704  INR 2.3 1.7* 1.9* 2.0* 2.2*   CBC: Recent Labs  Lab 02/01/19 0941 02/02/19 0812 02/03/19 0704  WBC 5.3 4.8 5.5  NEUTROABS 3.9 3.7 4.2  HGB 9.4* 9.3* 9.6*  HCT 32.2* 32.1* 33.5*  MCV 83.0 83.4 83.5  PLT 161 139* 137*   Cardiac Enzymes: No results for input(s): CKTOTAL, CKMB, CKMBINDEX, TROPONINI in the last 168 hours. BNP: Invalid input(s): POCBNP CBG: Recent Labs  Lab 02/01/19 2056 02/02/19 0744 02/02/19 2138 02/03/19 0838 02/03/19 1206  GLUCAP 144* 148* 186* 156* 191*   HbA1C: No results for input(s): HGBA1C in the last 72 hours. Urine analysis:    Component Value Date/Time   COLORURINE YELLOW 01/16/2018 1400   APPEARANCEUR CLEAR 01/16/2018 1400   LABSPEC 1.014 01/16/2018 1400   PHURINE 5.0 01/16/2018 1400   GLUCOSEU NEGATIVE 01/16/2018 1400   GLUCOSEU NEGATIVE 09/24/2017 1112   HGBUR NEGATIVE 01/16/2018 1400   BILIRUBINUR neg 01/01/2019 1436   KETONESUR NEGATIVE 01/16/2018 1400   PROTEINUR Negative 01/01/2019 1436   PROTEINUR NEGATIVE 01/16/2018 1400   UROBILINOGEN 0.2 01/01/2019 1436   UROBILINOGEN 0.2 09/24/2017 1112   NITRITE neg 01/01/2019 1436   NITRITE NEGATIVE 01/16/2018 1400   LEUKOCYTESUR Negative 01/01/2019 1436   Sepsis Labs: @LABRCNTIP (procalcitonin:4,lacticidven:4) ) Recent Results (from the past 240 hour(s))  Blood Culture (routine x 2)     Status: None (Preliminary result)   Collection Time: 02/01/19  9:41 AM   Specimen: Right Antecubital; Blood  Result Value Ref Range Status   Specimen  Description   Final    RIGHT ANTECUBITAL BOTTLES DRAWN AEROBIC AND ANAEROBIC   Special Requests Blood Culture adequate volume  Final   Culture   Final    NO GROWTH 2 DAYS Performed at [facility], [address] [place], [place] [address]    Report Status PENDING  Incomplete  Blood Culture (routine x 2)     Status: None (Preliminary result)   Collection Time: 02/01/19  9:46 AM   Specimen: BLOOD RIGHT HAND  Result Value Ref Range Status   Specimen Description   Final    BLOOD RIGHT HAND BOTTLES DRAWN AEROBIC AND ANAEROBIC   Special Requests Blood Culture adequate volume  Final   Culture   Final    NO GROWTH 2 DAYS Performed at [facility], [address], [place],  [address]    Report Status PENDING  Incomplete     Scheduled Meds: . allopurinol  300 mg Oral Daily  . vitamin C  500 mg Oral Daily  . atorvastatin  80 mg Oral Daily  . benzonatate  100 mg Oral TID  . carvedilol  25 mg Oral BID  . cholecalciferol  1,000 Units Oral BID  . dexamethasone (DECADRON)  injection  6 mg Intravenous Q24H  . dextromethorphan-guaiFENesin  1 tablet Oral BID  . donepezil  5 mg Oral Daily  . fluticasone  2 spray Each Nare Daily  . gabapentin  300 mg Oral TID  . insulin aspart  0-5 Units Subcutaneous QHS  . insulin aspart  0-9 Units Subcutaneous TID WC  . Ipratropium-Albuterol  1 puff Inhalation Q6H  . iron polysaccharides  150 mg Oral Daily  . multivitamin with minerals  1 tablet Oral q morning - 10a  . oxybutynin  10 mg Oral QHS  . pantoprazole  40 mg Oral Daily  . tamsulosin  0.4 mg Oral Daily  . Warfarin - Pharmacist Dosing Inpatient   Does not apply q1800  . zinc sulfate  220 mg Oral Daily   Continuous Infusions: . remdesivir 100 mg in NS 100 mL 100 mg (02/03/19 1020)    Procedures/Studies: DG Chest Port 1 View  Result Date: 02/01/2019 CLINICAL DATA:  Shortness of breath. Additional history provided: Fever, cough and shortness of breath as well as generalized weakness. COVID  test pending. EXAM: PORTABLE CHEST 1 VIEW COMPARISON:  Chest radiograph 01/16/2018 FINDINGS: Prior median sternotomy. Redemonstrated left chest dual lead pacer device. Overlying cardiac monitoring leads. Cardiomegaly.  Aortic atherosclerosis. Shallow inspiration radiograph. Mild bibasilar opacities may reflect incomplete atelectasis or developing pneumonia. No sizable pleural effusion or evidence of pneumothorax. No acute bony abnormality. IMPRESSION: Shallow inspiration radiograph. Bibasilar opacities may reflect incomplete atelectasis and/or pneumonia. Cardiomegaly.  Aortic atherosclerosis. Electronically Signed   By: Kellie Simmering DO   On: 02/01/2019 10:03    Orson Eva, DO  [facility] Pager [phone]  If 7PM-7AM, please contact night-coverage [email or web address] Password [email or web address] 02/03/2019, 4:14 PM   LOS: 2 days

## 2019-02-03 NOTE — Progress Notes (Addendum)
ANTICOAGULATION CONSULT NOTE -   Pharmacy Consult for warfarin dosing  Indication: atrial fibrillation   Allergies  Allergen Reactions  . Crestor [Rosuvastatin Calcium] Other (See Comments)    Urination of blood  . Latex Rash and Other (See Comments)    Gloves cause welts on hands!!      Patient Measurements: Last Weight  Most recent update: 02/01/2019  9:30 AM   Weight  99.3 kg (218 lb 14.7 oz)           Body mass index is 28.11 kg/m. [name]               Temp: 98 F (36.7 C) (01/04 0527) Temp Source: Oral (01/04 0527) BP: 142/87 (01/04 0527) Pulse Rate: 64 (01/04 0527)  Labs: Recent Labs    02/01/19 0941 02/02/19 0812 02/02/19 0927 02/03/19 0704  HGB 9.4* 9.3*  --  9.6*  HCT 32.2* 32.1*  --  33.5*  PLT 161 139*  --  137*  LABPROT  --  22.0* 22.5* 24.1*  INR  --  1.9* 2.0* 2.2*  CREATININE 2.43* 1.24  --  0.94    Estimated Creatinine Clearance: 89.4 mL/min (by C-G formula based on SCr of 0.94 mg/dL).     Medications:  Medications Prior to Admission  Medication Sig Dispense Refill Last Dose  . albuterol (PROAIR HFA) 108 (90 Base) MCG/ACT inhaler Inhale 2 puffs into the lungs every 6 (six) hours as needed for wheezing or shortness of breath. 3 Inhaler 3 Past Week at Unknown time  . allopurinol (ZYLOPRIM) 300 MG tablet TAKE 1 TABLET BY MOUTH  DAILY 90 tablet 2 01/31/2019 at Unknown time  . atorvastatin (LIPITOR) 80 MG tablet TAKE 1 TABLET BY MOUTH  DAILY 90 tablet 3 01/31/2019 at Unknown time  . Blood Glucose Monitoring Suppl (ONE TOUCH ULTRA 2) w/Device KIT Use as directed 1 each 3   . carvedilol (COREG) 25 MG tablet TAKE 1 TABLET BY MOUTH TWO  TIMES DAILY 180 tablet 2 01/31/2019 at 2000  . Cholecalciferol (VITAMIN D) 1000 UNITS capsule Take 1,000 Units by mouth 2 (two) times daily. Reported on 02/15/2015   01/31/2019 at Unknown time  . dextromethorphan-guaiFENesin (MUCINEX DM) 30-600 MG 12hr tablet Take 1 tablet by mouth 2 (two) times daily. 30 tablet 0  01/31/2019 at Unknown time  . donepezil (ARICEPT) 5 MG tablet TAKE 1 TABLET BY MOUTH  DAILY 90 tablet 3 01/31/2019 at Unknown time  . fluticasone (FLONASE) 50 MCG/ACT nasal spray Place 2 sprays into both nostrils daily. 48 g 5 01/31/2019 at Unknown time  . furosemide (LASIX) 40 MG tablet Take 2 tablets (80 mg total) by mouth 2 (two) times daily. 720 tablet 3 01/31/2019 at Unknown time  . gabapentin (NEURONTIN) 300 MG capsule TAKE 2 CAPSULES BY MOUTH 3  TIMES DAILY 540 capsule 2 01/31/2019 at Unknown time  . glipiZIDE (GLUCOTROL XL) 10 MG 24 hr tablet TAKE 1 TABLET BY MOUTH  DAILY WITH BREAKFAST 90 tablet 2 01/31/2019 at Unknown time  . glucose blood (ONE TOUCH ULTRA TEST) test strip CHECK BLOOD SUGAR TWO TIMES DAILY 100 each 3   . HYDROcodone-acetaminophen (NORCO) 10-325 MG tablet Take 1 tablet by mouth daily as needed. 30 tablet 0 01/31/2019 at Unknown time  . iron polysaccharides (NU-IRON) 150 MG capsule Take 1 capsule (150 mg total) by mouth daily. 90 capsule 1 Past Week at Unknown time  . lisinopril (ZESTRIL) 2.5 MG tablet TAKE 1 TABLET BY MOUTH  DAILY 90  tablet 2 01/31/2019 at Unknown time  . metFORMIN (GLUCOPHAGE) 500 MG tablet TAKE 4 TABLETS BY MOUTH  DAILY 360 tablet 2 01/31/2019 at Unknown time  . Multiple Vitamin (MULTIVITAMIN WITH MINERALS) TABS tablet Take 1 tablet by mouth every morning.   01/31/2019 at Unknown time  . ONETOUCH DELICA LANCETS 55D MISC USE AS DIRECTED THREE TIMES DAILY TO CHECK BLOOD SUGAR 300 each 0   . oxybutynin (DITROPAN-XL) 10 MG 24 hr tablet TAKE 1 TABLET BY MOUTH AT  BEDTIME 90 tablet 3 01/31/2019 at Unknown time  . pantoprazole (PROTONIX) 40 MG tablet TAKE 1 TABLET (40 MG TOTAL) BY MOUTH DAILY. TAKE 30 MINUTES BEFORE A MEAL. 90 tablet 0 01/31/2019 at Unknown time  . potassium chloride SA (KLOR-CON) 20 MEQ tablet TAKE 1 TABLET BY MOUTH  EVERY EVENING 90 tablet 2 01/31/2019 at Unknown time  . sitaGLIPtin (JANUVIA) 100 MG tablet Take 1 tablet (100 mg total) by mouth daily. 90 tablet 3  01/31/2019 at Unknown time  . tamsulosin (FLOMAX) 0.4 MG CAPS capsule TAKE 1 CAPSULE BY MOUTH  TWICE DAILY 180 capsule 2 01/31/2019 at Unknown time  . tiZANidine (ZANAFLEX) 4 MG tablet TAKE 1 TABLET BY MOUTH   EVERY 6 HOURS AS NEEDED FOR MUSCLE SPASMS. 180 tablet 2 01/31/2019 at Unknown time  . warfarin (COUMADIN) 5 MG tablet TAKE 1/2 TO 1 TABLET BY MOUTH DAILY AS DIRECTED BY COUMADIN CLINIC (Patient taking differently: 5 mg daily. ) 90 tablet 1 01/31/2019 at 0900  . benzonatate (TESSALON PERLES) 100 MG capsule 1-2 tab by mouth every 6 hrs as needed for cough (Patient not taking: Reported on 02/01/2019) 60 capsule 1 Not Taking at Unknown time  . traZODone (DESYREL) 50 MG tablet Take 0.5-1 tablets (25-50 mg total) by mouth at bedtime as needed for sleep. (Patient not taking: Reported on 02/01/2019) 90 tablet 1 Not Taking at Unknown time   Scheduled:  . allopurinol  300 mg Oral Daily  . vitamin C  500 mg Oral Daily  . atorvastatin  80 mg Oral Daily  . benzonatate  100 mg Oral TID  . carvedilol  25 mg Oral BID  . cholecalciferol  1,000 Units Oral BID  . dexamethasone (DECADRON) injection  6 mg Intravenous Q24H  . dextromethorphan-guaiFENesin  1 tablet Oral BID  . donepezil  5 mg Oral Daily  . fluticasone  2 spray Each Nare Daily  . gabapentin  300 mg Oral TID  . insulin aspart  0-5 Units Subcutaneous QHS  . insulin aspart  0-9 Units Subcutaneous TID WC  . Ipratropium-Albuterol  1 puff Inhalation Q6H  . iron polysaccharides  150 mg Oral Daily  . multivitamin with minerals  1 tablet Oral q morning - 10a  . oxybutynin  10 mg Oral QHS  . pantoprazole  40 mg Oral Daily  . tamsulosin  0.4 mg Oral Daily  . warfarin  5 mg Oral q1800  . Warfarin - Pharmacist Dosing Inpatient   Does not apply q1800  . zinc sulfate  220 mg Oral Daily   Infusions:  . remdesivir 100 mg in NS 100 mL 100 mg (02/03/19 1020)   PRN: acetaminophen, HYDROcodone-acetaminophen, ondansetron **OR** ondansetron (ZOFRAN) IV,  traZODone Anti-infectives (From admission, onward)   Start     Dose/Rate Route Frequency Ordered Stop   02/02/19 1000  remdesivir 100 mg in sodium chloride 0.9 % 100 mL IVPB     100 mg 200 mL/hr over 30 Minutes Intravenous Daily 02/01/19 1257 02/06/19 0959  02/01/19 1300  remdesivir 200 mg in sodium chloride 0.9% 250 mL IVPB     200 mg 580 mL/hr over 30 Minutes Intravenous Once 02/01/19 1257 02/01/19 1556      Goal of Therapy:  INR 2-3 Monitor platelets by anticoagulation protocol: Yes    Admit INR was 1.7 Lab Results  Component Value Date   INR 2.2 (H) 02/03/2019   INR 2.0 (H) 02/02/2019   INR 1.9 (H) 02/02/2019    Assessment: [name] a 74 y.o. Collier requires anticoagulation with warfarin for the indication of  atrial fibrillation. Warfarin will be initiated inpatient following pharmacy protocol per pharmacy consult.  12/29 in anticoag clinic: Dose is 886m on Mon, Wed, Friday 2.549mall other days. INR is therapeutic today at 2.2  Plan: Warfarin 86m2mo today x 1 Monitor CBC MWF with am labs   Monitor INR daily Monitor for signs and symptoms of bleeding   LorIsac SarnaS PhaVena AustriaCPS Clinical Pharmacist Pager #[phone]/2021 at [phone]

## 2019-02-04 LAB — CBC WITH DIFFERENTIAL/PLATELET
Abs Immature Granulocytes: 0.02 10*3/uL (ref 0.00–0.07)
Basophils Absolute: 0 10*3/uL (ref 0.0–0.1)
Basophils Relative: 0 %
Eosinophils Absolute: 0 10*3/uL (ref 0.0–0.5)
Eosinophils Relative: 0 %
HCT: 31.7 % — ABNORMAL LOW (ref 39.0–52.0)
Hemoglobin: 9.3 g/dL — ABNORMAL LOW (ref 13.0–17.0)
Immature Granulocytes: 0 %
Lymphocytes Relative: 13 %
Lymphs Abs: 0.8 10*3/uL (ref 0.7–4.0)
MCH: 24.2 pg — ABNORMAL LOW (ref 26.0–34.0)
MCHC: 29.3 g/dL — ABNORMAL LOW (ref 30.0–36.0)
MCV: 82.3 fL (ref 80.0–100.0)
Monocytes Absolute: 0.4 10*3/uL (ref 0.1–1.0)
Monocytes Relative: 7 %
Neutro Abs: 4.7 10*3/uL (ref 1.7–7.7)
Neutrophils Relative %: 80 %
Platelets: 122 10*3/uL — ABNORMAL LOW (ref 150–400)
RBC: 3.85 MIL/uL — ABNORMAL LOW (ref 4.22–5.81)
RDW: 18.4 % — ABNORMAL HIGH (ref 11.5–15.5)
WBC: 5.9 10*3/uL (ref 4.0–10.5)
nRBC: 0 % (ref 0.0–0.2)

## 2019-02-04 LAB — COMPREHENSIVE METABOLIC PANEL
ALT: 30 U/L (ref 0–44)
AST: 36 U/L (ref 15–41)
Albumin: 3.5 g/dL (ref 3.5–5.0)
Alkaline Phosphatase: 52 U/L (ref 38–126)
Anion gap: 8 (ref 5–15)
BUN: 31 mg/dL — ABNORMAL HIGH (ref 8–23)
CO2: 25 mmol/L (ref 22–32)
Calcium: 8.6 mg/dL — ABNORMAL LOW (ref 8.9–10.3)
Chloride: 102 mmol/L (ref 98–111)
Creatinine, Ser: 0.83 mg/dL (ref 0.61–1.24)
GFR calc Af Amer: >60 mL/min (ref >60)
GFR calc non Af Amer: >60 mL/min (ref >60)
Glucose, Bld: 212 mg/dL — ABNORMAL HIGH (ref 70–99)
Potassium: 4.7 mmol/L (ref 3.5–5.1)
Sodium: 135 mmol/L (ref 135–145)
Total Bilirubin: 0.8 mg/dL (ref 0.3–1.2)
Total Protein: 6.5 g/dL (ref 6.5–8.1)

## 2019-02-04 LAB — HEMOGLOBIN A1C
Hgb A1c MFr Bld: 6.4 % — ABNORMAL HIGH (ref 4.8–5.6)
Mean Plasma Glucose: 137 mg/dL

## 2019-02-04 LAB — GLUCOSE, CAPILLARY
Glucose-Capillary: 200 mg/dL — ABNORMAL HIGH (ref 70–99)
Glucose-Capillary: 238 mg/dL — ABNORMAL HIGH (ref 70–99)
Glucose-Capillary: 221 mg/dL — ABNORMAL HIGH (ref 70–99)
Glucose-Capillary: 291 mg/dL — ABNORMAL HIGH (ref 70–99)

## 2019-02-04 LAB — D-DIMER, QUANTITATIVE: D-Dimer, Quant: <0.27 ug/mL-FEU (ref 0.00–0.50)

## 2019-02-04 LAB — MAGNESIUM: Magnesium: 2.1 mg/dL (ref 1.7–2.4)

## 2019-02-04 LAB — PROTIME-INR
INR: 2.7 — ABNORMAL HIGH (ref 0.8–1.2)
Prothrombin Time: 29 seconds — ABNORMAL HIGH (ref 11.4–15.2)

## 2019-02-04 LAB — FERRITIN: Ferritin: 51 ng/mL (ref 24–336)

## 2019-02-04 LAB — C-REACTIVE PROTEIN: CRP: 2.4 mg/dL — ABNORMAL HIGH (ref <1.0)

## 2019-02-04 MED ORDER — WARFARIN SODIUM 2.5 MG PO TABS
2.5000 mg | ORAL_TABLET | Freq: Once | ORAL | Status: DC
  Filled 2019-02-04: qty 1

## 2019-02-04 NOTE — Progress Notes (Signed)
PROGRESS NOTE  [name] [id number] DOB: December 07, 1945 DOA: 02/01/2019 PCP: Biagio Borg, MD  Brief History: 74 year old male with a history of atrial fibrillation, complete heart block status post permanent pacemaker, coronary artery disease status post stenting and CABG, hypertension, hyperlipidemia, diabetes mellitus type 2, diastolic CHF presenting with approximately 1 week history of fever, cough, shortness of breath, and fatigue. The patient is a difficult historian as he seems quite frustrated that he has to be hospitalized for his current medical condition. Nevertheless, the patient ultimately endorses that he has had some worsening shortness of breath, coughing, and fatigue over the past several days. He denies any nausea, vomiting, abdominal pain, headache, neck pain. He has had some loose stools without hematochezia or melena. In the emergency department, the patient was noted to have oxygen saturation 84% on room air. WBC 5.3, hemoglobin 9.4, platelets 161,000. The patient has elevated inflammatory markers. Chest x-ray showed bibasilar opacities. Covid 19 test was positive. Patient was started on remdesivir and IV steroids.  He has improved clinically and has been weaned to RA.  Assessment/Plan: Acute respiratory failure with hypoxia -Secondary to COVID-19 pneumonia -CRP 1.3>>1.8>>2.4 -Ferritin 31>>>58>>51 -D-dimer 0.36>>>0.43>>> <0.27 -PCT<0.10 -Lactic acid 1.1>>>1.3 -LDH 213 -Fibrinogen 316 -stable on 3L>>>weaned to RA -pt states he does not want to have to drive to [facility] for outpt infusions -finish remdesivir on 02/05/19 am  Chronic atrial fibrillation -Rate controlled -Continue warfarin -Continue carvedilol  Acute on chronic renal failure--CKD stage 3a -baseline creatinine 0.9-1.2 -presented with serum creatinine 2.43 -judicious IVF--serum creatinine improved to 0.83  Complete heart block -Status post PPM -Patient follows Dr. Cristopher Peru  Coronary artery disease -Status post CABG -Denies any chest pain -Personally reviewed EKG--atrial flutter with LBBB -Continue carvedilol and Lipitor  Diabetes mellitus type 2 -NovoLog sliding scale -Holding Januvia, glipizide, metformin -01/01/19--hemoglobin A1c--6.8  Hyperlipidemia -Continue statin  Cognitive impairment -Continue Aricept  BPH -continue tamulosin      Disposition Plan: Home on 02/04/18  Family Communication:Spouse updated 02/04/19  Consultants:none  Code Status: DNR  DVT Prophylaxis:coumadin   Procedures: As Listed in Progress Note Above  Antibiotics: None     Subjective: Patient denies fevers, chills, headache, chest pain, dyspnea, nausea, vomiting, diarrhea, abdominal pain, dysuria, hematuria, hematochezia, and melena.   Objective: Vitals:   02/03/19 1558 02/03/19 2137 02/04/19 0531 02/04/19 1412  BP: 127/85 128/86 (!) 147/91 124/80  Pulse: 62 62 66 61  Resp: 18 18 18 18   Temp:  98.5 F (36.9 C) 98.4 F (36.9 C) 98.4 F (36.9 C)  TempSrc:  Oral Oral Oral  SpO2: 97% 96% 95% 95%  Weight:      Height:        Intake/Output Summary (Last 24 hours) at 02/04/2019 1607 Last data filed at 02/04/2019 1300 Gross per 24 hour  Intake 600 ml  Output --  Net 600 ml   Weight change:  Exam:   General:  Pt is alert, follows commands appropriately, not in acute distress  HEENT: No icterus, No thrush, No neck mass, [place]/AT  Cardiovascular: RRR, S1/S2, no rubs, no gallops  Respiratory: bibasilar rales. No wheeze  Abdomen: Soft/+BS, non tender, non distended, no guarding  Extremities: No edema, No lymphangitis, No petechiae, No rashes, no synovitis   Data Reviewed: I have personally reviewed following labs and imaging studies Basic Metabolic Panel: Recent Labs  Lab 02/01/19 0941 02/02/19 0812 02/03/19 0704 02/04/19 0624  NA 138 140 141 135  K 4.6 4.6 5.1 4.7  CL 95* 103 103 102  CO2 29 24 28 25    GLUCOSE 109* 165* 205* 212*  BUN 52* 45* 37* 31*  CREATININE 2.43* 1.24 0.94 0.83  CALCIUM 8.9 8.6* 9.0 8.6*  MG  --  2.3 2.2 2.1   Liver Function Tests: Recent Labs  Lab 02/01/19 0941 02/02/19 0812 02/03/19 0704 02/04/19 0624  AST 49* 47* 43* 36  ALT 38 32 31 30  ALKPHOS 72 58 56 52  BILITOT 0.5 0.7 0.7 0.8  PROT 7.7 6.6 6.8 6.5  ALBUMIN 4.5 3.6 3.6 3.5   No results for input(s): LIPASE, AMYLASE in the last 168 hours. No results for input(s): AMMONIA in the last 168 hours. Coagulation Profile: Recent Labs  Lab 02/01/19 0951 02/02/19 0812 02/02/19 0927 02/03/19 0704 02/04/19 0624  INR 1.7* 1.9* 2.0* 2.2* 2.7*   CBC: Recent Labs  Lab 02/01/19 0941 02/02/19 0812 02/03/19 0704 02/04/19 0624  WBC 5.3 4.8 5.5 5.9  NEUTROABS 3.9 3.7 4.2 4.7  HGB 9.4* 9.3* 9.6* 9.3*  HCT 32.2* 32.1* 33.5* 31.7*  MCV 83.0 83.4 83.5 82.3  PLT 161 139* 137* 122*   Cardiac Enzymes: No results for input(s): CKTOTAL, CKMB, CKMBINDEX, TROPONINI in the last 168 hours. BNP: Invalid input(s): POCBNP CBG: Recent Labs  Lab 02/03/19 1206 02/03/19 1651 02/03/19 2138 02/04/19 0752 02/04/19 1104  GLUCAP 191* 162* 270* 200* 238*   HbA1C: Recent Labs    02/03/19 0704  HGBA1C 6.4*   Urine analysis:    Component Value Date/Time   COLORURINE YELLOW 01/16/2018 1400   APPEARANCEUR CLEAR 01/16/2018 1400   LABSPEC 1.014 01/16/2018 1400   PHURINE 5.0 01/16/2018 1400   GLUCOSEU NEGATIVE 01/16/2018 1400   GLUCOSEU NEGATIVE 09/24/2017 1112   HGBUR NEGATIVE 01/16/2018 1400   BILIRUBINUR neg 01/01/2019 1436   KETONESUR NEGATIVE 01/16/2018 1400   PROTEINUR Negative 01/01/2019 1436   PROTEINUR NEGATIVE 01/16/2018 1400   UROBILINOGEN 0.2 01/01/2019 1436   UROBILINOGEN 0.2 09/24/2017 1112   NITRITE neg 01/01/2019 1436   NITRITE NEGATIVE 01/16/2018 1400   LEUKOCYTESUR Negative 01/01/2019 1436   Sepsis Labs: @LABRCNTIP (procalcitonin:4,lacticidven:4) ) Recent Results (from the past 240  hour(s))  Blood Culture (routine x 2)     Status: None (Preliminary result)   Collection Time: 02/01/19  9:41 AM   Specimen: Right Antecubital; Blood  Result Value Ref Range Status   Specimen Description   Final    RIGHT ANTECUBITAL BOTTLES DRAWN AEROBIC AND ANAEROBIC   Special Requests Blood Culture adequate volume  Final   Culture   Final    NO GROWTH 3 DAYS Performed at [facility], [address], [place], [place] [address]    Report Status PENDING  Incomplete  Blood Culture (routine x 2)     Status: None (Preliminary result)   Collection Time: 02/01/19  9:46 AM   Specimen: BLOOD RIGHT HAND  Result Value Ref Range Status   Specimen Description   Final    BLOOD RIGHT HAND BOTTLES DRAWN AEROBIC AND ANAEROBIC   Special Requests Blood Culture adequate volume  Final   Culture   Final    NO GROWTH 3 DAYS Performed at [facility], [address] [place], [place] [address]    Report Status PENDING  Incomplete     Scheduled Meds: . allopurinol  300 mg Oral Daily  . vitamin C  500 mg Oral Daily  . atorvastatin  80 mg Oral Daily  . benzonatate  100 mg Oral  TID  . carvedilol  25 mg Oral BID  . cholecalciferol  1,000 Units Oral BID  . dexamethasone (DECADRON) injection  6 mg Intravenous Q24H  . dextromethorphan-guaiFENesin  1 tablet Oral BID  . donepezil  5 mg Oral Daily  . fluticasone  2 spray Each Nare Daily  . gabapentin  300 mg Oral TID  . insulin aspart  0-5 Units Subcutaneous QHS  . insulin aspart  0-9 Units Subcutaneous TID WC  . Ipratropium-Albuterol  1 puff Inhalation Q6H  . iron polysaccharides  150 mg Oral Daily  . multivitamin with minerals  1 tablet Oral q morning - 10a  . oxybutynin  10 mg Oral QHS  . pantoprazole  40 mg Oral Daily  . tamsulosin  0.4 mg Oral Daily  . warfarin  2.5 mg Oral Once  . Warfarin - Pharmacist Dosing Inpatient   Does not apply q1800  . zinc sulfate  220 mg Oral Daily   Continuous Infusions: . remdesivir 100 mg in NS 100 mL 100 mg  (02/04/19 1146)    Procedures/Studies: DG Chest Port 1 View  Result Date: 02/01/2019 CLINICAL DATA:  Shortness of breath. Additional history provided: Fever, cough and shortness of breath as well as generalized weakness. COVID test pending. EXAM: PORTABLE CHEST 1 VIEW COMPARISON:  Chest radiograph 01/16/2018 FINDINGS: Prior median sternotomy. Redemonstrated left chest dual lead pacer device. Overlying cardiac monitoring leads. Cardiomegaly.  Aortic atherosclerosis. Shallow inspiration radiograph. Mild bibasilar opacities may reflect incomplete atelectasis or developing pneumonia. No sizable pleural effusion or evidence of pneumothorax. No acute bony abnormality. IMPRESSION: Shallow inspiration radiograph. Bibasilar opacities may reflect incomplete atelectasis and/or pneumonia. Cardiomegaly.  Aortic atherosclerosis. Electronically Signed   By: Kellie Simmering DO   On: 02/01/2019 10:03    Orson Eva, DO  [facility] Pager [phone]  If 7PM-7AM, please contact night-coverage [email or web address] Password [email or web address] 02/04/2019, 4:07 PM   LOS: 3 days

## 2019-02-04 NOTE — Progress Notes (Signed)
Patient has been complaining of leg cramps and not being able to sleep. Patient was given PRN ordered 25mg  of Trazadone and hydrocodone 10/325mg  with no relief and still restless. Patient asked for more pain medication but I informed him he could have it again at 0331 as it is scheduled as needed every 6 hours. Patient verbalizes understanding. Will continue to monitor.

## 2019-02-04 NOTE — Progress Notes (Signed)
Inpatient Diabetes Program Recommendations  AACE/ADA: New Consensus Statement on Inpatient Glycemic Control  Target Ranges:  Prepandial:   less than 140 mg/dL      Peak postprandial:   less than 180 mg/dL (1-2 hours)      Critically ill patients:  140 - 180 mg/dL   Results for MIAN, KOLM (MRN [id number]) as of 02/04/2019 08:58  Ref. Range 02/03/2019 08:38 02/03/2019 12:06 02/03/2019 16:51 02/03/2019 21:38 02/04/2019 07:52  Glucose-Capillary Latest Ref Range: 70 - 99 mg/dL 156 (H) 191 (H) 162 (H) 270 (H) 200 (H)   Review of Glycemic Control  Diabetes history: DM2 Outpatient Diabetes medications: Glipizide XL 10 mg QAM, Metformin 2000 mg daily, Januvia 100 mg daily Current orders for Inpatient glycemic control: Novolog 0-9 units TID with meals, Novolog 0-5 units QHS; Decadron 6 mg Q24H  Inpatient Diabetes Program Recommendations:   Insulin - Meal Coverage: If steroids are continued, please consider ordering Novolog 3 units TID with meals for meal coverage if patient eats at least 50% of meals.  Thanks, Barnie Alderman, RN, MSN, CDE Diabetes Coordinator Inpatient Diabetes Program [phone] (Team Pager from 8am to 5pm)

## 2019-02-04 NOTE — Progress Notes (Signed)
ANTICOAGULATION CONSULT NOTE -   Pharmacy Consult for warfarin dosing  Indication: atrial fibrillation   Allergies  Allergen Reactions  . Crestor [Rosuvastatin Calcium] Other (See Comments)    Urination of blood  . Latex Rash and Other (See Comments)    Gloves cause welts on hands!!      Patient Measurements: Last Weight  Most recent update: 02/01/2019  9:30 AM   Weight  99.3 kg (218 lb 14.7 oz)           Body mass index is 28.11 kg/m. [name]               Temp: 98.4 F (36.9 C) (01/05 0531) Temp Source: Oral (01/05 0531) BP: 147/91 (01/05 0531) Pulse Rate: 66 (01/05 0531)  Labs: Recent Labs    02/02/19 0812 02/02/19 0927 02/03/19 0704 02/04/19 0624  HGB 9.3*  --  9.6* 9.3*  HCT 32.1*  --  33.5* 31.7*  PLT 139*  --  137* 122*  LABPROT 22.0* 22.5* 24.1* 29.0*  INR 1.9* 2.0* 2.2* 2.7*  CREATININE 1.24  --  0.94 0.83    Estimated Creatinine Clearance: 101.3 mL/min (by C-G formula based on SCr of 0.83 mg/dL).     Medications:  Medications Prior to Admission  Medication Sig Dispense Refill Last Dose  . albuterol (PROAIR HFA) 108 (90 Base) MCG/ACT inhaler Inhale 2 puffs into the lungs every 6 (six) hours as needed for wheezing or shortness of breath. 3 Inhaler 3 Past Week at Unknown time  . allopurinol (ZYLOPRIM) 300 MG tablet TAKE 1 TABLET BY MOUTH  DAILY 90 tablet 2 01/31/2019 at Unknown time  . atorvastatin (LIPITOR) 80 MG tablet TAKE 1 TABLET BY MOUTH  DAILY 90 tablet 3 01/31/2019 at Unknown time  . Blood Glucose Monitoring Suppl (ONE TOUCH ULTRA 2) w/Device KIT Use as directed 1 each 3   . carvedilol (COREG) 25 MG tablet TAKE 1 TABLET BY MOUTH TWO  TIMES DAILY 180 tablet 2 01/31/2019 at 2000  . Cholecalciferol (VITAMIN D) 1000 UNITS capsule Take 1,000 Units by mouth 2 (two) times daily. Reported on 02/15/2015   01/31/2019 at Unknown time  . dextromethorphan-guaiFENesin (MUCINEX DM) 30-600 MG 12hr tablet Take 1 tablet by mouth 2 (two) times daily. 30 tablet 0  01/31/2019 at Unknown time  . donepezil (ARICEPT) 5 MG tablet TAKE 1 TABLET BY MOUTH  DAILY 90 tablet 3 01/31/2019 at Unknown time  . fluticasone (FLONASE) 50 MCG/ACT nasal spray Place 2 sprays into both nostrils daily. 48 g 5 01/31/2019 at Unknown time  . furosemide (LASIX) 40 MG tablet Take 2 tablets (80 mg total) by mouth 2 (two) times daily. 720 tablet 3 01/31/2019 at Unknown time  . gabapentin (NEURONTIN) 300 MG capsule TAKE 2 CAPSULES BY MOUTH 3  TIMES DAILY 540 capsule 2 01/31/2019 at Unknown time  . glipiZIDE (GLUCOTROL XL) 10 MG 24 hr tablet TAKE 1 TABLET BY MOUTH  DAILY WITH BREAKFAST 90 tablet 2 01/31/2019 at Unknown time  . glucose blood (ONE TOUCH ULTRA TEST) test strip CHECK BLOOD SUGAR TWO TIMES DAILY 100 each 3   . HYDROcodone-acetaminophen (NORCO) 10-325 MG tablet Take 1 tablet by mouth daily as needed. 30 tablet 0 01/31/2019 at Unknown time  . iron polysaccharides (NU-IRON) 150 MG capsule Take 1 capsule (150 mg total) by mouth daily. 90 capsule 1 Past Week at Unknown time  . lisinopril (ZESTRIL) 2.5 MG tablet TAKE 1 TABLET BY MOUTH  DAILY 90 tablet 2 01/31/2019 at  Unknown time  . metFORMIN (GLUCOPHAGE) 500 MG tablet TAKE 4 TABLETS BY MOUTH  DAILY 360 tablet 2 01/31/2019 at Unknown time  . Multiple Vitamin (MULTIVITAMIN WITH MINERALS) TABS tablet Take 1 tablet by mouth every morning.   01/31/2019 at Unknown time  . ONETOUCH DELICA LANCETS 64Q MISC USE AS DIRECTED THREE TIMES DAILY TO CHECK BLOOD SUGAR 300 each 0   . oxybutynin (DITROPAN-XL) 10 MG 24 hr tablet TAKE 1 TABLET BY MOUTH AT  BEDTIME 90 tablet 3 01/31/2019 at Unknown time  . pantoprazole (PROTONIX) 40 MG tablet TAKE 1 TABLET (40 MG TOTAL) BY MOUTH DAILY. TAKE 30 MINUTES BEFORE A MEAL. 90 tablet 0 01/31/2019 at Unknown time  . potassium chloride SA (KLOR-CON) 20 MEQ tablet TAKE 1 TABLET BY MOUTH  EVERY EVENING 90 tablet 2 01/31/2019 at Unknown time  . sitaGLIPtin (JANUVIA) 100 MG tablet Take 1 tablet (100 mg total) by mouth daily. 90 tablet 3  01/31/2019 at Unknown time  . tamsulosin (FLOMAX) 0.4 MG CAPS capsule TAKE 1 CAPSULE BY MOUTH  TWICE DAILY 180 capsule 2 01/31/2019 at Unknown time  . tiZANidine (ZANAFLEX) 4 MG tablet TAKE 1 TABLET BY MOUTH   EVERY 6 HOURS AS NEEDED FOR MUSCLE SPASMS. 180 tablet 2 01/31/2019 at Unknown time  . warfarin (COUMADIN) 5 MG tablet TAKE 1/2 TO 1 TABLET BY MOUTH DAILY AS DIRECTED BY COUMADIN CLINIC (Patient taking differently: 5 mg daily. ) 90 tablet 1 01/31/2019 at 0900  . benzonatate (TESSALON PERLES) 100 MG capsule 1-2 tab by mouth every 6 hrs as needed for cough (Patient not taking: Reported on 02/01/2019) 60 capsule 1 Not Taking at Unknown time  . traZODone (DESYREL) 50 MG tablet Take 0.5-1 tablets (25-50 mg total) by mouth at bedtime as needed for sleep. (Patient not taking: Reported on 02/01/2019) 90 tablet 1 Not Taking at Unknown time   Scheduled:  . allopurinol  300 mg Oral Daily  . vitamin C  500 mg Oral Daily  . atorvastatin  80 mg Oral Daily  . benzonatate  100 mg Oral TID  . carvedilol  25 mg Oral BID  . cholecalciferol  1,000 Units Oral BID  . dexamethasone (DECADRON) injection  6 mg Intravenous Q24H  . dextromethorphan-guaiFENesin  1 tablet Oral BID  . donepezil  5 mg Oral Daily  . fluticasone  2 spray Each Nare Daily  . gabapentin  300 mg Oral TID  . insulin aspart  0-5 Units Subcutaneous QHS  . insulin aspart  0-9 Units Subcutaneous TID WC  . Ipratropium-Albuterol  1 puff Inhalation Q6H  . iron polysaccharides  150 mg Oral Daily  . multivitamin with minerals  1 tablet Oral q morning - 10a  . oxybutynin  10 mg Oral QHS  . pantoprazole  40 mg Oral Daily  . tamsulosin  0.4 mg Oral Daily  . Warfarin - Pharmacist Dosing Inpatient   Does not apply q1800  . zinc sulfate  220 mg Oral Daily   Infusions:  . remdesivir 100 mg in NS 100 mL 100 mg (02/03/19 1020)   PRN: acetaminophen, HYDROcodone-acetaminophen, ondansetron **OR** ondansetron (ZOFRAN) IV, traZODone Anti-infectives (From admission,  onward)   Start     Dose/Rate Route Frequency Ordered Stop   02/02/19 1000  remdesivir 100 mg in sodium chloride 0.9 % 100 mL IVPB     100 mg 200 mL/hr over 30 Minutes Intravenous Daily 02/01/19 1257 02/06/19 0959   02/01/19 1300  remdesivir 200 mg in sodium chloride 0.9% 250  mL IVPB     200 mg 580 mL/hr over 30 Minutes Intravenous Once 02/01/19 1257 02/01/19 1556      Goal of Therapy:  INR 2-3 Monitor platelets by anticoagulation protocol: Yes    Admit INR was 1.7 Lab Results  Component Value Date   INR 2.7 (H) 02/04/2019   INR 2.2 (H) 02/03/2019   INR 2.0 (H) 02/02/2019    Assessment: [name] a 74 y.o. male requires anticoagulation with warfarin for the indication of  atrial fibrillation. Warfarin will be initiated inpatient following pharmacy protocol per pharmacy consult.  12/29 in anticoag clinic: Dose is 6m on Mon, Wed, Friday 2.513mall other days. INR is therapeutic today at 2.2>> 2.7  Plan: Warfarin 2.52m74mo today x 1 Monitor CBC MWF with am labs   Monitor INR daily Monitor for signs and symptoms of bleeding   LorIsac SarnaS PhaVena AustriaCPS Clinical Pharmacist Pager #[phone]/2021 at [phone]

## 2019-02-04 NOTE — TOC Initial Note (Signed)
Transition of Care [facility]) - Initial/Assessment Note    Patient Details  Name: [name] MRN: [id number] Date of Birth: [birth year]  Transition of Care [facility]) CM/SW Contact:    Ihor Gully, LCSW Phone Number: 02/04/2019, 3:55 PM  Clinical Narrative:                 Patient from home with spouse. Admitted for weakness/cough. Covid+. At baseline patient ambulates with a cane. He has a walker in the home. He is independent with ADLs and drives himself to appointments.  Patient has no current needs. TOC will address any needs that arise.   Expected Discharge Plan: Home/Self Care Barriers to Discharge: Continued Medical Work up   Patient Goals and CMS Choice Patient states their goals for this hospitalization and ongoing recovery are:: To return home with wife and dog.      Expected Discharge Plan and Services Expected Discharge Plan: Home/Self Care       Living arrangements for the past 2 months: Single Family Home                                      Prior Living Arrangements/Services Living arrangements for the past 2 months: Single Family Home Lives with:: Spouse Patient language and need for interpreter reviewed:: Yes Do you feel safe going back to the place where you live?: Yes      Need for Family Participation in Patient Care: Yes (Comment) Care giver support system in place?: Yes (comment)   Criminal Activity/Legal Involvement Pertinent to Current Situation/Hospitalization: No - Comment as needed  Activities of Daily Living Home Assistive Devices/Equipment: Cane (specify quad or straight) ADL Screening (condition at time of admission) Patient's cognitive ability adequate to safely complete daily activities?: Yes Is the patient deaf or have difficulty hearing?: No Does the patient have difficulty seeing, even when wearing glasses/contacts?: No Does the patient have difficulty concentrating, remembering, or making decisions?: No Patient able to  express need for assistance with ADLs?: Yes Does the patient have difficulty dressing or bathing?: No Independently performs ADLs?: Yes (appropriate for developmental age) Does the patient have difficulty walking or climbing stairs?: Yes Weakness of Legs: Both Weakness of Arms/Hands: None  Permission Sought/Granted                  Emotional Assessment Appearance:: Appears younger than stated age   Affect (typically observed): Appropriate Orientation: : Oriented to Self, Oriented to Place, Oriented to  Time, Oriented to Situation Alcohol / Substance Use: Not Applicable Psych Involvement: No (comment)  Admission diagnosis:  Hypoxia [R09.02] AKI (acute kidney injury) ([place]) [N17.9] Acute hypoxemic respiratory failure due to COVID-19 ([place]) [U07.1, J96.01] COVID-19 [U07.1] Patient Active Problem List   Diagnosis Date Noted  . Acute renal failure superimposed on stage 3a chronic kidney disease ([place]) 02/02/2019  . Complete heart block ([place]) 02/02/2019  . Atrial fibrillation, chronic ([place]) 02/02/2019  . Acute hypoxemic respiratory failure due to COVID-19 ([place]) 02/01/2019  . Esophageal dysmotility 08/15/2018  . Gastroesophageal reflux disease 08/15/2018  . Nail problem 07/10/2018  . Right knee pain 03/13/2018  . Degenerative joint disease of knee, left 03/13/2018  . Nondisplaced longitudinal fracture of right patella, initial encounter for closed fracture 03/13/2018  . Chondrocalcinosis 02/05/2018  . Acute respiratory failure with hypoxia ([place]) 01/16/2018  . Elevated troponin 01/16/2018  . Weakness 12/21/2017  . Hypersomnolence 09/20/2017  . Acute renal  failure superimposed on stage 2 chronic kidney disease ([place]) 06/16/2017  . Left cervical radiculopathy 02/07/2017  . Trochanteric bursitis, right hip 01/01/2017  . Right hip pain 12/26/2016  . Knee instability, right 11/03/2016  . Urinary frequency 10/20/2016  . Right foot pain 09/29/2016  . Prepatellar bursitis 06/29/2016   . Left knee pain 06/28/2016  . Ingrown nail 06/28/2016  . Acute gouty arthritis 05/07/2016  . Left shoulder pain 02/09/2016  . Memory loss 01/19/2016  . Ingrown toenail 09/13/2015  . Renal insufficiency 09/08/2015  . Right leg swelling 07/13/2015  . Leg wound, right 06/15/2015  . Rash 06/06/2015  . Anemia 05/24/2015  . Cellulitis of leg, right 05/13/2015  . Greater trochanteric bursitis of left hip 08/12/2014  . Left leg pain 07/15/2014  . 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 Lumbar stenosis with neurogenic claudication 03/23/2014  . Long term current use of anticoagulant therapy 02/27/2014  . Dysphagia, pharyngoesophageal phase 12/01/2013  . 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 Dysphagia 11/11/2013  . Lumbago 11/03/2013  . Low back pain radiating to right leg 11/03/2013  . Abnormality of gait 11/03/2013  . Difficulty walking 11/03/2013  . Loss of weight 10/29/2013  . Atrial fibrillation ([place]) 08/26/2013  . Chest pain 04/05/2013  . Plantar fasciitis of right foot 02/18/2013  . Bruit 08/21/2012  . Sciatica of left side 08/15/2012  . Left hip pain 08/15/2012  . Headache(784.0) 05/03/2012  . Increased prostate specific antigen (PSA) velocity 11/11/2011  . Right sided sciatica 11/10/2011  . Chronic low back pain 05/03/2011  . Aortic root dilatation ([place]) 02/02/2011  . Wheezing without diagnosis of asthma 02/02/2011  . Syncope 12/27/2010  . Left lumbar radiculopathy 05/30/2010  . Wellness examination 05/30/2010  . Atrioventricular block, complete ([place]) 09/04/2008  . DOE (dyspnea on exertion) 06/10/2008  . DIASTOLIC HEART FAILURE, CHRONIC 06/09/2008  . KNEE PAIN, BILATERAL 04/21/2008  . LEG PAIN, BILATERAL 04/21/2008  . 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 PACEMAKER, PERMANENT 03/04/2008  . INSOMNIA-SLEEP DISORDER-UNSPEC 10/23/2007  . Lumbar radiculopathy, chronic 06/10/2007  . GOUT 04/22/2007  . NEOPLASM,  MALIGNANT, PROSTATE 11/26/2006  . ERECTILE DYSFUNCTION 11/26/2006  . ALLERGIC RHINITIS 11/26/2006  . Type 2 diabetes, uncontrolled, with neuropathy ([place]) 08/29/2006  . Hyperlipidemia 08/29/2006  . Overweight(278.02) 08/29/2006  . Depression 08/29/2006  . Essential hypertension 08/29/2006  . CORONARY ARTERY DISEASE 08/29/2006  . BENIGN PROSTATIC HYPERTROPHY 08/29/2006   PCP:  Biagio Borg, MD Pharmacy:   [facility] [address] [place] [address] Phone: [phone] Fax: [phone]  [id number] [place], [address] [place] [address] Phone: [phone] Fax: [phone]     Social Determinants of Health (SDOH) Interventions    Readmission Risk Interventions No flowsheet data found.

## 2019-02-04 NOTE — Care Management Important Message (Signed)
Important Message  Patient Details  Name: [name] MRN: [id number] Date of Birth: 1945/12/11   Medicare Important Message Given:  Yes(Brandi, RN will deliver letter to patient due to contact precautions)     Tommy Medal 02/04/2019, 4:28 PM

## 2019-02-05 LAB — CBC WITH DIFFERENTIAL/PLATELET
Abs Immature Granulocytes: 0.02 10*3/uL (ref 0.00–0.07)
Basophils Absolute: 0 10*3/uL (ref 0.0–0.1)
Basophils Relative: 0 %
Eosinophils Absolute: 0 10*3/uL (ref 0.0–0.5)
Eosinophils Relative: 0 %
HCT: 31.7 % — ABNORMAL LOW (ref 39.0–52.0)
Hemoglobin: 9.3 g/dL — ABNORMAL LOW (ref 13.0–17.0)
Immature Granulocytes: 0 %
Lymphocytes Relative: 15 %
Lymphs Abs: 0.7 10*3/uL (ref 0.7–4.0)
MCH: 24.3 pg — ABNORMAL LOW (ref 26.0–34.0)
MCHC: 29.3 g/dL — ABNORMAL LOW (ref 30.0–36.0)
MCV: 82.8 fL (ref 80.0–100.0)
Monocytes Absolute: 0.4 10*3/uL (ref 0.1–1.0)
Monocytes Relative: 8 %
Neutro Abs: 3.6 10*3/uL (ref 1.7–7.7)
Neutrophils Relative %: 77 %
Platelets: 134 10*3/uL — ABNORMAL LOW (ref 150–400)
RBC: 3.83 MIL/uL — ABNORMAL LOW (ref 4.22–5.81)
RDW: 18.6 % — ABNORMAL HIGH (ref 11.5–15.5)
WBC: 4.6 10*3/uL (ref 4.0–10.5)
nRBC: 0 % (ref 0.0–0.2)

## 2019-02-05 LAB — GLUCOSE, CAPILLARY
Glucose-Capillary: 185 mg/dL — ABNORMAL HIGH (ref 70–99)
Glucose-Capillary: 202 mg/dL — ABNORMAL HIGH (ref 70–99)
Glucose-Capillary: 201 mg/dL — ABNORMAL HIGH (ref 70–99)
Glucose-Capillary: 231 mg/dL — ABNORMAL HIGH (ref 70–99)

## 2019-02-05 LAB — COMPREHENSIVE METABOLIC PANEL
ALT: 30 U/L (ref 0–44)
AST: 29 U/L (ref 15–41)
Albumin: 3.4 g/dL — ABNORMAL LOW (ref 3.5–5.0)
Alkaline Phosphatase: 54 U/L (ref 38–126)
Anion gap: 10 (ref 5–15)
BUN: 26 mg/dL — ABNORMAL HIGH (ref 8–23)
CO2: 25 mmol/L (ref 22–32)
Calcium: 8.7 mg/dL — ABNORMAL LOW (ref 8.9–10.3)
Chloride: 101 mmol/L (ref 98–111)
Creatinine, Ser: 0.78 mg/dL (ref 0.61–1.24)
GFR calc Af Amer: >60 mL/min (ref >60)
GFR calc non Af Amer: >60 mL/min (ref >60)
Glucose, Bld: 247 mg/dL — ABNORMAL HIGH (ref 70–99)
Potassium: 4.7 mmol/L (ref 3.5–5.1)
Sodium: 136 mmol/L (ref 135–145)
Total Bilirubin: 0.9 mg/dL (ref 0.3–1.2)
Total Protein: 6.3 g/dL — ABNORMAL LOW (ref 6.5–8.1)

## 2019-02-05 LAB — D-DIMER, QUANTITATIVE: D-Dimer, Quant: 0.3 ug/mL-FEU (ref 0.00–0.50)

## 2019-02-05 LAB — FERRITIN: Ferritin: 39 ng/mL (ref 24–336)

## 2019-02-05 LAB — C-REACTIVE PROTEIN: CRP: 1.4 mg/dL — ABNORMAL HIGH (ref <1.0)

## 2019-02-05 LAB — PROTIME-INR
INR: 2.7 — ABNORMAL HIGH (ref 0.8–1.2)
Prothrombin Time: 28.5 seconds — ABNORMAL HIGH (ref 11.4–15.2)

## 2019-02-05 LAB — MAGNESIUM: Magnesium: 2 mg/dL (ref 1.7–2.4)

## 2019-02-05 MED ORDER — WARFARIN SODIUM 5 MG PO TABS: 5.0000 mg | ORAL_TABLET | Freq: Once | ORAL | Status: DC

## 2019-02-05 MED ORDER — PREDNISONE 20 MG PO TABS: ORAL_TABLET | ORAL | 0 refills | Status: DC

## 2019-02-05 MED ORDER — LISINOPRIL 2.5 MG PO TABS: 2.5000 mg | ORAL_TABLET | Freq: Every day | ORAL | 2 refills | Status: AC

## 2019-02-05 NOTE — Progress Notes (Addendum)
ANTICOAGULATION CONSULT NOTE -   Pharmacy Consult for warfarin dosing  Indication: atrial fibrillation   Allergies  Allergen Reactions  . Crestor [Rosuvastatin Calcium] Other (See Comments)    Urination of blood  . Latex Rash and Other (See Comments)    Gloves cause welts on hands!!      Patient Measurements: Last Weight  Most recent update: 02/01/2019  9:30 AM   Weight  99.3 kg (218 lb 14.7 oz)           Body mass index is 28.11 kg/m. [name]               Temp: 98.1 F (36.7 C) (01/06 0557) Temp Source: Oral (01/06 0557) BP: 112/83 (01/06 0557) Pulse Rate: 59 (01/06 0557)  Labs: Recent Labs    02/03/19 0704 02/04/19 0624 02/05/19 0413  HGB 9.6* 9.3* 9.3*  HCT 33.5* 31.7* 31.7*  PLT 137* 122* 134*  LABPROT 24.1* 29.0* 28.5*  INR 2.2* 2.7* 2.7*  CREATININE 0.94 0.83 0.78    Estimated Creatinine Clearance: 105.1 mL/min (by C-G formula based on SCr of 0.78 mg/dL).     Medications:  Medications Prior to Admission  Medication Sig Dispense Refill Last Dose  . albuterol (PROAIR HFA) 108 (90 Base) MCG/ACT inhaler Inhale 2 puffs into the lungs every 6 (six) hours as needed for wheezing or shortness of breath. 3 Inhaler 3 Past Week at Unknown time  . allopurinol (ZYLOPRIM) 300 MG tablet TAKE 1 TABLET BY MOUTH  DAILY 90 tablet 2 01/31/2019 at Unknown time  . atorvastatin (LIPITOR) 80 MG tablet TAKE 1 TABLET BY MOUTH  DAILY 90 tablet 3 01/31/2019 at Unknown time  . Blood Glucose Monitoring Suppl (ONE TOUCH ULTRA 2) w/Device KIT Use as directed 1 each 3   . carvedilol (COREG) 25 MG tablet TAKE 1 TABLET BY MOUTH TWO  TIMES DAILY 180 tablet 2 01/31/2019 at 2000  . Cholecalciferol (VITAMIN D) 1000 UNITS capsule Take 1,000 Units by mouth 2 (two) times daily. Reported on 02/15/2015   01/31/2019 at Unknown time  . dextromethorphan-guaiFENesin (MUCINEX DM) 30-600 MG 12hr tablet Take 1 tablet by mouth 2 (two) times daily. 30 tablet 0 01/31/2019 at Unknown time  . donepezil  (ARICEPT) 5 MG tablet TAKE 1 TABLET BY MOUTH  DAILY 90 tablet 3 01/31/2019 at Unknown time  . fluticasone (FLONASE) 50 MCG/ACT nasal spray Place 2 sprays into both nostrils daily. 48 g 5 01/31/2019 at Unknown time  . furosemide (LASIX) 40 MG tablet Take 2 tablets (80 mg total) by mouth 2 (two) times daily. 720 tablet 3 01/31/2019 at Unknown time  . gabapentin (NEURONTIN) 300 MG capsule TAKE 2 CAPSULES BY MOUTH 3  TIMES DAILY 540 capsule 2 01/31/2019 at Unknown time  . glipiZIDE (GLUCOTROL XL) 10 MG 24 hr tablet TAKE 1 TABLET BY MOUTH  DAILY WITH BREAKFAST 90 tablet 2 01/31/2019 at Unknown time  . glucose blood (ONE TOUCH ULTRA TEST) test strip CHECK BLOOD SUGAR TWO TIMES DAILY 100 each 3   . HYDROcodone-acetaminophen (NORCO) 10-325 MG tablet Take 1 tablet by mouth daily as needed. 30 tablet 0 01/31/2019 at Unknown time  . iron polysaccharides (NU-IRON) 150 MG capsule Take 1 capsule (150 mg total) by mouth daily. 90 capsule 1 Past Week at Unknown time  . lisinopril (ZESTRIL) 2.5 MG tablet TAKE 1 TABLET BY MOUTH  DAILY 90 tablet 2 01/31/2019 at Unknown time  . metFORMIN (GLUCOPHAGE) 500 MG tablet TAKE 4 TABLETS BY MOUTH  DAILY  360 tablet 2 01/31/2019 at Unknown time  . Multiple Vitamin (MULTIVITAMIN WITH MINERALS) TABS tablet Take 1 tablet by mouth every morning.   01/31/2019 at Unknown time  . ONETOUCH DELICA LANCETS 73A MISC USE AS DIRECTED THREE TIMES DAILY TO CHECK BLOOD SUGAR 300 each 0   . oxybutynin (DITROPAN-XL) 10 MG 24 hr tablet TAKE 1 TABLET BY MOUTH AT  BEDTIME 90 tablet 3 01/31/2019 at Unknown time  . pantoprazole (PROTONIX) 40 MG tablet TAKE 1 TABLET (40 MG TOTAL) BY MOUTH DAILY. TAKE 30 MINUTES BEFORE A MEAL. 90 tablet 0 01/31/2019 at Unknown time  . potassium chloride SA (KLOR-CON) 20 MEQ tablet TAKE 1 TABLET BY MOUTH  EVERY EVENING 90 tablet 2 01/31/2019 at Unknown time  . sitaGLIPtin (JANUVIA) 100 MG tablet Take 1 tablet (100 mg total) by mouth daily. 90 tablet 3 01/31/2019 at Unknown time  . tamsulosin  (FLOMAX) 0.4 MG CAPS capsule TAKE 1 CAPSULE BY MOUTH  TWICE DAILY 180 capsule 2 01/31/2019 at Unknown time  . tiZANidine (ZANAFLEX) 4 MG tablet TAKE 1 TABLET BY MOUTH   EVERY 6 HOURS AS NEEDED FOR MUSCLE SPASMS. 180 tablet 2 01/31/2019 at Unknown time  . warfarin (COUMADIN) 5 MG tablet TAKE 1/2 TO 1 TABLET BY MOUTH DAILY AS DIRECTED BY COUMADIN CLINIC (Patient taking differently: 5 mg daily. ) 90 tablet 1 01/31/2019 at 0900  . benzonatate (TESSALON PERLES) 100 MG capsule 1-2 tab by mouth every 6 hrs as needed for cough (Patient not taking: Reported on 02/01/2019) 60 capsule 1 Not Taking at Unknown time  . traZODone (DESYREL) 50 MG tablet Take 0.5-1 tablets (25-50 mg total) by mouth at bedtime as needed for sleep. (Patient not taking: Reported on 02/01/2019) 90 tablet 1 Not Taking at Unknown time   Scheduled:  . allopurinol  300 mg Oral Daily  . vitamin C  500 mg Oral Daily  . atorvastatin  80 mg Oral Daily  . benzonatate  100 mg Oral TID  . carvedilol  25 mg Oral BID  . cholecalciferol  1,000 Units Oral BID  . dexamethasone (DECADRON) injection  6 mg Intravenous Q24H  . dextromethorphan-guaiFENesin  1 tablet Oral BID  . donepezil  5 mg Oral Daily  . fluticasone  2 spray Each Nare Daily  . gabapentin  300 mg Oral TID  . insulin aspart  0-5 Units Subcutaneous QHS  . insulin aspart  0-9 Units Subcutaneous TID WC  . Ipratropium-Albuterol  1 puff Inhalation Q6H  . iron polysaccharides  150 mg Oral Daily  . multivitamin with minerals  1 tablet Oral q morning - 10a  . oxybutynin  10 mg Oral QHS  . pantoprazole  40 mg Oral Daily  . tamsulosin  0.4 mg Oral Daily  . warfarin  2.5 mg Oral Once  . Warfarin - Pharmacist Dosing Inpatient   Does not apply q1800  . zinc sulfate  220 mg Oral Daily   Infusions:  . remdesivir 100 mg in NS 100 mL 100 mg (02/04/19 1146)   PRN: acetaminophen, HYDROcodone-acetaminophen, ondansetron **OR** ondansetron (ZOFRAN) IV, traZODone Anti-infectives (From admission, onward)    Start     Dose/Rate Route Frequency Ordered Stop   02/02/19 1000  remdesivir 100 mg in sodium chloride 0.9 % 100 mL IVPB     100 mg 200 mL/hr over 30 Minutes Intravenous Daily 02/01/19 1257 02/06/19 0959   02/01/19 1300  remdesivir 200 mg in sodium chloride 0.9% 250 mL IVPB     200 mg  580 mL/hr over 30 Minutes Intravenous Once 02/01/19 1257 02/01/19 1556      Goal of Therapy:  INR 2-3 Monitor platelets by anticoagulation protocol: Yes    Admit INR was 1.7 Lab Results  Component Value Date   INR 2.7 (H) 02/05/2019   INR 2.7 (H) 02/04/2019   INR 2.2 (H) 02/03/2019    Assessment: [name] a 74 y.o. male requires anticoagulation with warfarin for the indication of  atrial fibrillation. Warfarin will be initiated inpatient following pharmacy protocol per pharmacy consult.  12/29 in anticoag clinic: Dose is 54m on Mon, Wed, Friday 2.553mall other days. INR is therapeutic today at 2.7- 1/5 dose not given by RN for unknown reason  Plan: Warfarin 55m355mo today x 1   Monitor CBC MWF with am labs   Monitor INR daily Monitor for signs and symptoms of bleeding   SteMargot AblesharmD Clinical Pharmacist 02/05/2019 8:43 AM

## 2019-02-05 NOTE — Discharge Summary (Signed)
Physician Discharge Summary  [name] [id number] DOB: 09/21/1945 DOA: 02/01/2019  PCP: Biagio Borg, MD  Admit date: 02/01/2019 Discharge date: 02/05/2019  Time spent: 35 minutes  Recommendations for Outpatient Follow-up:  1. Repeat complete metabolic panel to follow electrolytes, renal function and LFTs. 2. -Close monitoring to patient CBGs with further adjustment to hypoglycemic regimen as required. 3. -Repeat chest x-ray in 8 weeks to assure complete resolution of infiltrates.   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  Discharged home with instruction to follow-up with PCP in 10 days.  Diet recommendation: Heart healthy and modify carbohydrates.  Filed Weights   02/01/19 0930  Weight: 99.3 kg    History of present illness:  As per H&P written by Dr. Manuella Ghazi on 02/01/2019 74 y.o. male with medical history significant for atrial fibrillation/flutter status post permanent pacemaker placement on anticoagulation with Coumadin, BPH, CAD with prior CABG, hypertension, dyslipidemia, diabetes, chronic gout, peripheral neuropathy who presented to the ED with complaints of fever, productive cough, and shortness of breath.  He has had whitish sputum production and was in contact with his niece who had a upper respiratory infection.  No other known Covid contacts.  He denies any chest pain or swelling in his legs and states that he overall feels quite fatigued over the last week.  He has otherwise been taking his medications as usual.  He denies any nausea, vomiting, or diarrhea.   ED Course:  Temperature of 102.3 Fahrenheit noted on arrival with saturation approximately 84% on room air.  His Covid antigen testing is noted to be positive.  His hemoglobin is 9.4 which appears to be stable per his chronic anemia.  His 1 view chest x-ray demonstrates some findings of pneumonia and atelectasis at the bases.  His creatinine is currently 2.43 with baseline around 1.1-1.3.  His procalcitonin is less than 0.10, CRP 1.3, and ferritin 31.  His INR is currently 1.7 and uses Coumadin for anticoagulation.  He has been given a 500 mL fluid bolus and has been started on 2 L nasal cannula oxygen without any significant respiratory distress.   Hospital Course:  Acute respiratory failure with hypoxia in the setting of COVID-19 pneumonia -Afebrile with good oxygen saturation at time of discharge -Patient completed 5 days of remdesivir -Discharge on a steroid tapering -Repeat chest x-ray in 8 weeks to assure complete resolution of infiltrates -Repeat complete metabolic panel at follow-up visit to reassess electrolytes, LFTs and renal function. -Patient reminded to continue monitoring his temperature, to use Tylenol as needed and to maintain adequate hydration.  Chronic atrial fibrillation -Rate controlled and stable -Continue the use of carvedilol and warfarin.  Acute on chronic renal failure/chronic kidney disease stage IIIa. -Baseline creatinine 0.91.2 -On admission patient with serum creatinine up to 2.43 -After fluid resuscitation and holding nephrotoxic agents creatinine improved to 0.83 -Safe to restart prior to admission home medication regimen -Patient advised to maintain adequate hydration -Repeat complete metabolic panel follow-up visit to reassess renal function trend.  Complete heart block -Status post pacemaker implantation -Continue outpatient follow-up with Dr. Lovena Le  History of coronary artery disease -Status post CABG -No chest pain or shortness of breath at  discharge. -  Continue beta-blocker and statins. -Heart healthy diet has been encouraged.  Hyperlipidemia -Continue statins  Uncontrolled type 2 diabetes mellitus with hyperglycemia and nephropathy -Patient acute hyperglycemia process most likely driven by the use of his steroids -Hemoglobin A1c in December 2020 was 6.8 -While in the hospital he was kept on a sliding scale insulin -Resume Januvia, glipizide and Metformin at time of discharge. -Continue to follow CBGs and A1c as an outpatient with further adjustment to hypoglycemic regimen as needed.  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  BPH -Continue Flomax and Ditropan -No complaints of urinary retention  Procedures:  See below for x-ray reports  Consultations:  None  Discharge Exam: Vitals:   02/04/19 2138 02/05/19 0557  BP: 128/83 112/83  Pulse: 60 (!) 59  Resp: 20 18  Temp: 98.4 F (36.9 C) 98.1 F (36.7 C)  SpO2: 91% 95%    General: Afebrile, no chest pain, no nausea, no vomiting; good oxygen saturation on room air.  Feeling ready to go home.  Still with some intermittent nonproductive coughing spells and feeling weak; but no acute distress. Cardiovascular: Rate controlled, no rubs, no gallops, no JVD on exam. Respiratory: Scattered rhonchi; no using accessory muscles.  Good oxygen saturation on room air. Abdomen: Soft, nontender, nondistended, positive bowel sounds Extremities: No cyanosis, no clubbing.  Discharge Instructions   Discharge Instructions    Diet - low sodium heart healthy   Complete by: As directed    Discharge instructions   Complete by: As directed    Take medications as prescribed Maintain adequate hydration Arrange follow-up with PCP in 10 days Follow a modified carbohydrate and heart healthy diet.    MyChart COVID-19 home monitoring program   Complete by: Feb 05, 2019    Is the patient willing to use the [place] for home monitoring?: Yes   Temperature monitoring   Complete by: Feb 05, 2019    After how many days would you like to receive a notification of this patient's flowsheet entries?: 1     Allergies as of 02/05/2019      Reactions   Crestor [rosuvastatin Calcium] Other (See Comments)   Urination of blood   Latex Rash, Other (See Comments)   Gloves cause welts on hands!!      Medication List    STOP taking these medications   traZODone 50 MG tablet Commonly known as: DESYREL     TAKE these medications   albuterol 108 (90 Base) MCG/ACT inhaler Commonly known as: ProAir HFA Inhale 2 puffs into the lungs every 6 (six) hours as needed for wheezing or shortness of breath.   allopurinol 300 MG tablet Commonly known as: ZYLOPRIM TAKE 1 TABLET BY MOUTH  DAILY   atorvastatin 80 MG tablet Commonly known as: LIPITOR TAKE 1 TABLET BY MOUTH  DAILY   benzonatate 100 MG capsule Commonly known as: Tessalon Perles 1-2 tab by mouth every 6 hrs as needed for cough   carvedilol 25 MG tablet Commonly known as: COREG TAKE 1 TABLET BY MOUTH TWO  TIMES DAILY   dextromethorphan-guaiFENesin 30-600 MG 12hr tablet Commonly known as: MUCINEX DM Take 1 tablet by mouth 2 (two) times daily.   donepezil 5 MG tablet Commonly known as: ARICEPT TAKE 1 TABLET BY MOUTH  DAILY   fluticasone 50 MCG/ACT nasal spray Commonly known as: [place]  2 sprays into both nostrils daily.   furosemide 40 MG tablet Commonly known as: LASIX Take 2 tablets (80 mg total) by mouth 2 (two) times daily.   gabapentin 300 MG capsule Commonly known as: NEURONTIN TAKE 2 CAPSULES BY MOUTH 3  TIMES DAILY   glipiZIDE 10 MG 24 hr tablet Commonly known as: GLUCOTROL XL TAKE 1 TABLET BY MOUTH  DAILY WITH BREAKFAST   glucose blood test strip Commonly known as: ONE TOUCH ULTRA TEST CHECK BLOOD  SUGAR TWO TIMES DAILY   HYDROcodone-acetaminophen 10-325 MG tablet Commonly known as: NORCO Take 1 tablet by mouth daily as needed.   iron polysaccharides 150 MG capsule Commonly known as: Nu-Iron Take 1 capsule (150 mg total) by mouth daily.   lisinopril 2.5 MG tablet Commonly known as: ZESTRIL Take 1 tablet (2.5 mg total) by mouth daily. Start taking on: February 07, 2019 What changed: These instructions start on February 07, 2019. If you are unsure what to do until then, ask your doctor or other care provider.   metFORMIN 500 MG tablet Commonly known as: GLUCOPHAGE TAKE 4 TABLETS BY MOUTH  DAILY   multivitamin with minerals Tabs tablet Take 1 tablet by mouth every morning.   ONE TOUCH ULTRA 2 w/Device Kit Use as directed   OneTouch Delica Lancets 71I Misc USE AS DIRECTED THREE TIMES DAILY TO CHECK BLOOD SUGAR   oxybutynin 10 MG 24 hr tablet Commonly known as: DITROPAN-XL TAKE 1 TABLET BY MOUTH AT  BEDTIME   pantoprazole 40 MG tablet Commonly known as: PROTONIX TAKE 1 TABLET (40 MG TOTAL) BY MOUTH DAILY. TAKE 30 MINUTES BEFORE A MEAL.   potassium chloride SA 20 MEQ tablet Commonly known as: KLOR-CON TAKE 1 TABLET BY MOUTH  EVERY EVENING   predniSONE 20 MG tablet Commonly known as: Deltasone Take 2 tablets by mouth daily x2 days; then 1 tablet by mouth daily x3 days; then half tablet by mouth daily x3 days and stop prednisone.   sitaGLIPtin 100 MG tablet Commonly known as: JANUVIA Take 1 tablet (100 mg total) by mouth daily.   tamsulosin 0.4 MG Caps capsule Commonly known as: FLOMAX TAKE 1 CAPSULE BY MOUTH  TWICE DAILY   tiZANidine 4 MG tablet Commonly known as: ZANAFLEX TAKE 1 TABLET BY MOUTH   EVERY 6 HOURS AS NEEDED FOR MUSCLE SPASMS.   Vitamin D 1000 units capsule Take 1,000 Units by mouth 2 (two) times daily. Reported on 02/15/2015   warfarin 5 MG tablet Commonly known as: COUMADIN Take as directed. If you are unsure how to take this medication, talk to  your nurse or doctor. Original instructions: TAKE 1/2 TO 1 TABLET BY MOUTH DAILY AS DIRECTED BY COUMADIN CLINIC What changed: See the new instructions.      Allergies  Allergen Reactions  . Crestor [Rosuvastatin Calcium] Other (See Comments)    Urination of blood  . Latex Rash and Other (See Comments)    Gloves cause welts on hands!!   Follow-up Information    Biagio Borg, MD. Schedule an appointment as soon as possible for a visit in 10 day(s).   Specialties: Internal Medicine, Radiology Contact information: [place] [address] [phone]        Evans Lance, MD .   Specialty: Cardiology Contact information: [place] [address] [phone]        [name], MD .   Specialty: Cardiology Contact information: [place]  [address] [phone]           The results of significant diagnostics from this hospitalization (including imaging, microbiology, ancillary and laboratory) are listed below for reference.    Significant Diagnostic Studies: DG Chest Port 1 View  Result Date: 02/01/2019 CLINICAL DATA:  Shortness of breath. Additional history provided: Fever, cough and shortness of breath as well as generalized weakness. COVID test pending. EXAM: PORTABLE CHEST 1 VIEW COMPARISON:  Chest radiograph 01/16/2018 FINDINGS: Prior median sternotomy. Redemonstrated left chest dual lead pacer device. Overlying cardiac monitoring leads. Cardiomegaly.  Aortic atherosclerosis. Shallow inspiration radiograph. Mild bibasilar opacities may reflect incomplete atelectasis or developing pneumonia. No sizable pleural effusion or evidence of pneumothorax. No acute bony abnormality. IMPRESSION: Shallow inspiration radiograph. Bibasilar opacities may reflect incomplete atelectasis and/or pneumonia. Cardiomegaly.  Aortic atherosclerosis. Electronically Signed   By: Kellie Simmering DO   On: 02/01/2019 10:03     Microbiology: Recent Results (from the past 240 hour(s))  Blood Culture (routine x 2)     Status: None (Preliminary result)   Collection Time: 02/01/19  9:41 AM   Specimen: Right Antecubital; Blood  Result Value Ref Range Status   Specimen Description   Final    RIGHT ANTECUBITAL BOTTLES DRAWN AEROBIC AND ANAEROBIC   Special Requests Blood Culture adequate volume  Final   Culture   Final    NO GROWTH 4 DAYS Performed at [facility], [address] [place], [place] [address]    Report Status PENDING  Incomplete  Blood Culture (routine x 2)     Status: None (Preliminary result)   Collection Time: 02/01/19  9:46 AM   Specimen: BLOOD RIGHT HAND  Result Value Ref Range Status   Specimen Description   Final    BLOOD RIGHT HAND BOTTLES DRAWN AEROBIC AND ANAEROBIC   Special Requests Blood Culture adequate volume  Final   Culture   Final    NO GROWTH 4 DAYS Performed at [facility], [address], [place], [place] [address]    Report Status PENDING  Incomplete     Labs: Basic Metabolic Panel: Recent Labs  Lab 02/01/19 0941 02/02/19 0812 02/03/19 0704 02/04/19 0624 02/05/19 0413  NA 138 140 141 135 136  K 4.6 4.6 5.1 4.7 4.7  CL 95* 103 103 102 101  CO2 _0 GLUCOSE 109* 165* 205* 212* 247*  BUN 52* 45* 37* 31* 26*  CREATININE 2.43* 1.24 0.94 0.83 0.78  CALCIUM 8.9 8.6* 9.0 8.6* 8.7*  MG  --  2.3 2.2 2.1 2.0   Liver Function Tests: Recent Labs  Lab 02/01/19 0941 02/02/19 0812 02/03/19 0704 02/04/19 0624 02/05/19 0413  AST 49* 47* 43* 36 29  ALT 38 32 _1 ALKPHOS 72 58 56 52 54  BILITOT 0.5 0.7 0.7 0.8 0.9  PROT 7.7 6.6 6.8 6.5 6.3*  ALBUMIN 4.5 3.6 3.6 3.5 3.4*   CBC: Recent Labs  Lab 02/01/19 0941 02/02/19 0812 02/03/19 0704 02/04/19 0624 02/05/19 0413  WBC 5.3 4.8 5.5 5.9 4.6  NEUTROABS 3.9 3.7 4.2 4.7 3.6  HGB 9.4* 9.3* 9.6* 9.3* 9.3*  HCT 32.2* 32.1* 33.5* 31.7* 31.7*  MCV 83.0 83.4 83.5 82.3 82.8  PLT 161 139* 137* 122*  134*   CBG: Recent Labs  Lab 02/04/19 1104 02/04/19 1632 02/04/19 2227 02/05/19 0744 02/05/19 1110  GLUCAP 238* 221* 291* 201* 231*   Signed:  Barton Dubois MD.  [facility] 02/05/2019, 12:53 PM

## 2019-02-05 NOTE — Progress Notes (Signed)
Inpatient Diabetes Program Recommendations  AACE/ADA: New Consensus Statement on Inpatient Glycemic Control   Target Ranges:  Prepandial:   less than 140 mg/dL      Peak postprandial:   less than 180 mg/dL (1-2 hours)      Critically ill patients:  140 - 180 mg/dL   Results for [name], [name] (MRN [id number]) as of 02/05/2019 11:38  Ref. Range 02/04/2019 07:52 02/04/2019 11:04 02/04/2019 16:32 02/04/2019 22:27 02/05/2019 07:44 02/05/2019 11:10  Glucose-Capillary Latest Ref Range: 70 - 99 mg/dL 200 (H) 238 (H) 221 (H) 291 (H) 201 (H) 231 (H)   Review of Glycemic Control  Diabetes history: DM2 Outpatient Diabetes medications: Glipizide XL 10 mg QAM, Metformin 2000 mg daily, Januvia 100 mg daily Current orders for Inpatient glycemic control: Novolog 0-9 units TID with meals, Novolog 0-5 units QHS; Decadron 6 mg Q24H  Inpatient Diabetes Program Recommendations:   Insulin-Basal: If steroids are continued, please consider ordering Lantus 10 units Q24H.  Insulin - Meal Coverage: If steroids are continued, please consider ordering Novolog 3 units TID with meals for meal coverage if patient eats at least 50% of meals.  Thanks, Barnie Alderman, RN, MSN, CDE Diabetes Coordinator Inpatient Diabetes Program [phone] (Team Pager from 8am to 5pm)

## 2019-02-06 LAB — CULTURE, BLOOD (ROUTINE X 2)
Culture: NO GROWTH
Culture: NO GROWTH
Special Requests: ADEQUATE
Special Requests: ADEQUATE

## 2019-02-07 ENCOUNTER — Telehealth: Payer: Self-pay

## 2019-02-07 NOTE — Telephone Encounter (Signed)
The pt states his home monitor do not work. He wants to come into the office to have his pacemaker check. He is also due to see Dr. Lovena Le in February. I told him I will send it to the scheduler to get him scheduled. I said she will give you a call. He was in the hospital for Covid-19 symptoms but did not have Covid he had pneumonia. He states can he get something mailed out with his appointment dates? I told him I will have [place] give him a call to figure out when it is good for him to come to the office and she will mail him a letter with his appointment dates as well.

## 2019-02-10 ENCOUNTER — Other Ambulatory Visit: Payer: Self-pay | Admitting: *Deleted

## 2019-02-10 ENCOUNTER — Encounter: Payer: Self-pay | Admitting: *Deleted

## 2019-02-10 NOTE — Patient Outreach (Signed)
[facility]) Care Management  02/10/2019  VUONG MCNULTY 01-Apr-1945 [id number]   RED ON EMMI ALERT - Transition Day # 1 Date: 02/06/2018 Red Alert Reason: No transportation to appointment and unfilled prescriptions   Outreach attempt #1, successful.  Referral received from care management assistant as member triggered red on EMMI transition dashboard for above noted reason.  He was admitted to the hospital 1/2-1/6 for Covid 19 infection, primary MD office will complete transition of care assessment.  Per chart, he also has history of HTN, CAD, CHF, A-fib, GERD, diabetes (A1C 6.4), BPH, and hyperlipidemia.  Member was recently active with disease management for diabetes management.  Call placed to member, identity verified.  This care manager introduced self and stated purpose of call.  He report improving very well, denies any shortness of breath but does have coughing spells at night, nonproductive cough.  He does not have follow up appointment scheduled yet with PCP, state he has tried to call but the line was busy.  He will try again today.  Denies need for transportation assistance stating he has a family friend that comes in often to help him out and he will provide transportation.    Reviewed medications with member, state he has prednisone prescription but his new lisonopril dose was sent to his mail order pharmacy.  Report having another friend that comes in to fill pill box weekly.  State he has "plenty of help" between his wife and other friends/family.    Attempted to review Covid precautions and symptoms, however member denies that he was positive for infection.  Advised that according to test results he was positive.  He denies again that he was positive, stating "my doctor told me I only had the symptoms, not the virus.  If I had the virus I would've been in the hospital more than 4 days."  Although member denies the positive diagnosis, he is advised to take  precautions when around others.  Denies any urgent concerns, will follow up with member within the next 2 weeks.  Fall Risk  02/10/2019 01/01/2019 12/16/2018 10/14/2018 09/13/2018  Falls in the past year? 0 0 0 0 0  Number falls in past yr: 0 - 0 - -  Comment - - - - -  Injury with Fall? 0 - 0 - -  Risk Factor Category  - - - - -  Comment - - - - -  Risk for fall due to : - - Impaired balance/gait;Impaired mobility - -  Follow up - - Falls evaluation completed;Falls prevention discussed - -   Depression screen [facility] 2/9 02/10/2019 01/01/2019 12/16/2018 09/13/2018 08/15/2018  Decreased Interest 0 0 0 0 0  Down, Depressed, Hopeless 0 0 0 0 0  PHQ - 2 Score 0 0 0 0 0  Some recent data might be hidden   [facility] CM Care Plan Problem One     Most Recent Value  Care Plan Problem One  Risk for readmission due to complications of Covid infection as evidenced by recent hospitalization  Role Documenting the Problem One  Care Management [place] for Problem One  Active  [facility] Long Term Goal   Member will not be readmitted to hospital within the next 31 days  [facility] Long Term Goal Start Date  02/10/19  Interventions for Problem One Long Term Goal  Reviewed discharge instructions with member.  Advised of Covid precautions   [facility] CM Short Term Goal #1   Member will  report attending follow up appointment with PCP within the next 2 weeks  [facility] CM Short Term Goal #1 Start Date  02/10/19  Interventions for Short Term Goal #1  Educated on importance of follow up appointments in effort to manage conditions and decrease risk of readmission.  Advised to contact office to schedule appointment as soon as possible  [facility] CM Short Term Goal #2   Member will report taking all medications within the next 3 weeks   [facility] CM Short Term Goal #2 Start Date  02/10/19  Interventions for Short Term Goal #2  Reviewed medication list with member.  Advised of importance of taking medications as prescribed and picking up all  medications from pharmacy.       Valente David, [place], MSN [place] [phone]

## 2019-02-12 ENCOUNTER — Other Ambulatory Visit: Payer: Self-pay | Admitting: *Deleted

## 2019-02-12 NOTE — Patient Outreach (Signed)
[facility]) Care Management  02/12/2019  KEAGHAN KOVALENKO 1945-06-29 [id number]   RN Health Discipline closure. Patient has been admitted into hospital.   Plan: Patient will be followed by complex Care Coordinator.  [place] Care Management [phone]

## 2019-02-13 ENCOUNTER — Ambulatory Visit: Payer: Medicare Other | Admitting: Internal Medicine

## 2019-02-15 ENCOUNTER — Encounter (HOSPITAL_COMMUNITY): Payer: Self-pay | Admitting: Emergency Medicine

## 2019-02-15 ENCOUNTER — Emergency Department (HOSPITAL_COMMUNITY): Payer: Medicare Other

## 2019-02-15 ENCOUNTER — Other Ambulatory Visit: Payer: Self-pay

## 2019-02-15 ENCOUNTER — Inpatient Hospital Stay (HOSPITAL_COMMUNITY)
Admission: EM | Admit: 2019-02-15 | Discharge: 2019-02-19 | DRG: 177 | Disposition: A | Discharge status: Skilled Nursing Facility | Payer: Medicare Other | Attending: Family Medicine | Admitting: Family Medicine

## 2019-02-15 DIAGNOSIS — J189 Pneumonia, unspecified organism: Secondary | ICD-10-CM | POA: Diagnosis not present

## 2019-02-15 DIAGNOSIS — I10 Essential (primary) hypertension: Secondary | ICD-10-CM | POA: Diagnosis not present

## 2019-02-15 DIAGNOSIS — R262 Difficulty in walking, not elsewhere classified: Secondary | ICD-10-CM | POA: Diagnosis not present

## 2019-02-15 DIAGNOSIS — Z801 Family history of malignant neoplasm of trachea, bronchus and lung: Secondary | ICD-10-CM

## 2019-02-15 DIAGNOSIS — I2510 Atherosclerotic heart disease of native coronary artery without angina pectoris: Secondary | ICD-10-CM | POA: Diagnosis present

## 2019-02-15 DIAGNOSIS — Z7984 Long term (current) use of oral hypoglycemic drugs: Secondary | ICD-10-CM

## 2019-02-15 DIAGNOSIS — Z8546 Personal history of malignant neoplasm of prostate: Secondary | ICD-10-CM

## 2019-02-15 DIAGNOSIS — I482 Chronic atrial fibrillation, unspecified: Secondary | ICD-10-CM | POA: Diagnosis not present

## 2019-02-15 DIAGNOSIS — Z7901 Long term (current) use of anticoagulants: Secondary | ICD-10-CM | POA: Diagnosis not present

## 2019-02-15 DIAGNOSIS — R0902 Hypoxemia: Secondary | ICD-10-CM

## 2019-02-15 DIAGNOSIS — M5416 Radiculopathy, lumbar region: Secondary | ICD-10-CM | POA: Diagnosis present

## 2019-02-15 DIAGNOSIS — E1142 Type 2 diabetes mellitus with diabetic polyneuropathy: Secondary | ICD-10-CM | POA: Diagnosis not present

## 2019-02-15 DIAGNOSIS — R791 Abnormal coagulation profile: Secondary | ICD-10-CM

## 2019-02-15 DIAGNOSIS — R1314 Dysphagia, pharyngoesophageal phase: Secondary | ICD-10-CM | POA: Diagnosis not present

## 2019-02-15 DIAGNOSIS — Z79899 Other long term (current) drug therapy: Secondary | ICD-10-CM

## 2019-02-15 DIAGNOSIS — N1831 Chronic kidney disease, stage 3a: Secondary | ICD-10-CM | POA: Diagnosis not present

## 2019-02-15 DIAGNOSIS — M109 Gout, unspecified: Secondary | ICD-10-CM | POA: Diagnosis present

## 2019-02-15 DIAGNOSIS — M48062 Spinal stenosis, lumbar region with neurogenic claudication: Secondary | ICD-10-CM | POA: Diagnosis present

## 2019-02-15 DIAGNOSIS — Z87891 Personal history of nicotine dependence: Secondary | ICD-10-CM | POA: Diagnosis not present

## 2019-02-15 DIAGNOSIS — Z955 Presence of coronary angioplasty implant and graft: Secondary | ICD-10-CM

## 2019-02-15 DIAGNOSIS — E1165 Type 2 diabetes mellitus with hyperglycemia: Secondary | ICD-10-CM | POA: Diagnosis not present

## 2019-02-15 DIAGNOSIS — I25810 Atherosclerosis of coronary artery bypass graft(s) without angina pectoris: Secondary | ICD-10-CM | POA: Diagnosis present

## 2019-02-15 DIAGNOSIS — R404 Transient alteration of awareness: Secondary | ICD-10-CM | POA: Diagnosis not present

## 2019-02-15 DIAGNOSIS — D631 Anemia in chronic kidney disease: Secondary | ICD-10-CM | POA: Diagnosis present

## 2019-02-15 DIAGNOSIS — I252 Old myocardial infarction: Secondary | ICD-10-CM

## 2019-02-15 DIAGNOSIS — J1282 Pneumonia due to coronavirus disease 2019: Secondary | ICD-10-CM | POA: Diagnosis present

## 2019-02-15 DIAGNOSIS — J9601 Acute respiratory failure with hypoxia: Secondary | ICD-10-CM | POA: Diagnosis present

## 2019-02-15 DIAGNOSIS — J449 Chronic obstructive pulmonary disease, unspecified: Secondary | ICD-10-CM | POA: Diagnosis not present

## 2019-02-15 DIAGNOSIS — I130 Hypertensive heart and chronic kidney disease with heart failure and stage 1 through stage 4 chronic kidney disease, or unspecified chronic kidney disease: Secondary | ICD-10-CM | POA: Diagnosis present

## 2019-02-15 DIAGNOSIS — Z66 Do not resuscitate: Secondary | ICD-10-CM | POA: Diagnosis not present

## 2019-02-15 DIAGNOSIS — J069 Acute upper respiratory infection, unspecified: Secondary | ICD-10-CM | POA: Diagnosis present

## 2019-02-15 DIAGNOSIS — D509 Iron deficiency anemia, unspecified: Secondary | ICD-10-CM | POA: Diagnosis not present

## 2019-02-15 DIAGNOSIS — W06XXXA Fall from bed, initial encounter: Secondary | ICD-10-CM | POA: Diagnosis present

## 2019-02-15 DIAGNOSIS — U071 COVID-19: Secondary | ICD-10-CM | POA: Diagnosis present

## 2019-02-15 DIAGNOSIS — Z950 Presence of cardiac pacemaker: Secondary | ICD-10-CM | POA: Diagnosis not present

## 2019-02-15 DIAGNOSIS — K219 Gastro-esophageal reflux disease without esophagitis: Secondary | ICD-10-CM | POA: Diagnosis not present

## 2019-02-15 DIAGNOSIS — Z7401 Bed confinement status: Secondary | ICD-10-CM | POA: Diagnosis not present

## 2019-02-15 DIAGNOSIS — Y92013 Bedroom of single-family (private) house as the place of occurrence of the external cause: Secondary | ICD-10-CM | POA: Diagnosis not present

## 2019-02-15 DIAGNOSIS — R000 Tachycardia, unspecified: Secondary | ICD-10-CM | POA: Diagnosis not present

## 2019-02-15 DIAGNOSIS — IMO0002 Reserved for concepts with insufficient information to code with codable children: Secondary | ICD-10-CM | POA: Diagnosis present

## 2019-02-15 DIAGNOSIS — I442 Atrioventricular block, complete: Secondary | ICD-10-CM | POA: Diagnosis present

## 2019-02-15 DIAGNOSIS — G4730 Sleep apnea, unspecified: Secondary | ICD-10-CM | POA: Diagnosis not present

## 2019-02-15 DIAGNOSIS — M25559 Pain in unspecified hip: Secondary | ICD-10-CM | POA: Diagnosis present

## 2019-02-15 DIAGNOSIS — I509 Heart failure, unspecified: Secondary | ICD-10-CM | POA: Diagnosis not present

## 2019-02-15 DIAGNOSIS — M6281 Muscle weakness (generalized): Secondary | ICD-10-CM | POA: Diagnosis not present

## 2019-02-15 DIAGNOSIS — Z8249 Family history of ischemic heart disease and other diseases of the circulatory system: Secondary | ICD-10-CM | POA: Diagnosis not present

## 2019-02-15 DIAGNOSIS — I77810 Thoracic aortic ectasia: Secondary | ICD-10-CM | POA: Diagnosis not present

## 2019-02-15 DIAGNOSIS — R1310 Dysphagia, unspecified: Secondary | ICD-10-CM | POA: Diagnosis not present

## 2019-02-15 DIAGNOSIS — I5032 Chronic diastolic (congestive) heart failure: Secondary | ICD-10-CM | POA: Diagnosis present

## 2019-02-15 DIAGNOSIS — Z91040 Latex allergy status: Secondary | ICD-10-CM

## 2019-02-15 DIAGNOSIS — I110 Hypertensive heart disease with heart failure: Secondary | ICD-10-CM | POA: Diagnosis not present

## 2019-02-15 DIAGNOSIS — Z743 Need for continuous supervision: Secondary | ICD-10-CM | POA: Diagnosis not present

## 2019-02-15 DIAGNOSIS — Z833 Family history of diabetes mellitus: Secondary | ICD-10-CM

## 2019-02-15 DIAGNOSIS — E1140 Type 2 diabetes mellitus with diabetic neuropathy, unspecified: Secondary | ICD-10-CM | POA: Diagnosis present

## 2019-02-15 DIAGNOSIS — E785 Hyperlipidemia, unspecified: Secondary | ICD-10-CM | POA: Diagnosis present

## 2019-02-15 DIAGNOSIS — Z888 Allergy status to other drugs, medicaments and biological substances status: Secondary | ICD-10-CM

## 2019-02-15 DIAGNOSIS — R5381 Other malaise: Secondary | ICD-10-CM | POA: Diagnosis not present

## 2019-02-15 DIAGNOSIS — Z951 Presence of aortocoronary bypass graft: Secondary | ICD-10-CM

## 2019-02-15 DIAGNOSIS — R1311 Dysphagia, oral phase: Secondary | ICD-10-CM | POA: Diagnosis not present

## 2019-02-15 DIAGNOSIS — T380X5A Adverse effect of glucocorticoids and synthetic analogues, initial encounter: Secondary | ICD-10-CM | POA: Diagnosis not present

## 2019-02-15 DIAGNOSIS — R197 Diarrhea, unspecified: Secondary | ICD-10-CM | POA: Diagnosis not present

## 2019-02-15 DIAGNOSIS — M25552 Pain in left hip: Secondary | ICD-10-CM | POA: Diagnosis not present

## 2019-02-15 DIAGNOSIS — I4891 Unspecified atrial fibrillation: Secondary | ICD-10-CM | POA: Diagnosis present

## 2019-02-15 DIAGNOSIS — S301XXA Contusion of abdominal wall, initial encounter: Secondary | ICD-10-CM | POA: Diagnosis present

## 2019-02-15 DIAGNOSIS — S79912A Unspecified injury of left hip, initial encounter: Secondary | ICD-10-CM | POA: Diagnosis not present

## 2019-02-15 DIAGNOSIS — R509 Fever, unspecified: Secondary | ICD-10-CM | POA: Diagnosis not present

## 2019-02-15 DIAGNOSIS — R69 Illness, unspecified: Secondary | ICD-10-CM | POA: Diagnosis not present

## 2019-02-15 NOTE — ED Triage Notes (Signed)
[facility] - pt states he does not why he is. EMS states that he was recently d/c'd with pneumonia, has been having diarrhea today, and slid off the bed onto the floor. Pt also c/o body aches. Pt was covid + on 02/01/19

## 2019-02-16 ENCOUNTER — Encounter (HOSPITAL_COMMUNITY): Payer: Self-pay | Admitting: Internal Medicine

## 2019-02-16 ENCOUNTER — Other Ambulatory Visit: Payer: Self-pay

## 2019-02-16 ENCOUNTER — Inpatient Hospital Stay (HOSPITAL_COMMUNITY): Payer: Medicare Other

## 2019-02-16 DIAGNOSIS — E1142 Type 2 diabetes mellitus with diabetic polyneuropathy: Secondary | ICD-10-CM | POA: Diagnosis present

## 2019-02-16 DIAGNOSIS — I130 Hypertensive heart and chronic kidney disease with heart failure and stage 1 through stage 4 chronic kidney disease, or unspecified chronic kidney disease: Secondary | ICD-10-CM | POA: Diagnosis present

## 2019-02-16 DIAGNOSIS — Z8546 Personal history of malignant neoplasm of prostate: Secondary | ICD-10-CM | POA: Diagnosis not present

## 2019-02-16 DIAGNOSIS — I442 Atrioventricular block, complete: Secondary | ICD-10-CM | POA: Diagnosis present

## 2019-02-16 DIAGNOSIS — I2510 Atherosclerotic heart disease of native coronary artery without angina pectoris: Secondary | ICD-10-CM | POA: Diagnosis present

## 2019-02-16 DIAGNOSIS — J9601 Acute respiratory failure with hypoxia: Secondary | ICD-10-CM | POA: Diagnosis present

## 2019-02-16 DIAGNOSIS — M25559 Pain in unspecified hip: Secondary | ICD-10-CM | POA: Diagnosis present

## 2019-02-16 DIAGNOSIS — Z7901 Long term (current) use of anticoagulants: Secondary | ICD-10-CM | POA: Diagnosis not present

## 2019-02-16 DIAGNOSIS — U071 COVID-19: Secondary | ICD-10-CM | POA: Diagnosis present

## 2019-02-16 DIAGNOSIS — Z801 Family history of malignant neoplasm of trachea, bronchus and lung: Secondary | ICD-10-CM | POA: Diagnosis not present

## 2019-02-16 DIAGNOSIS — E785 Hyperlipidemia, unspecified: Secondary | ICD-10-CM | POA: Diagnosis present

## 2019-02-16 DIAGNOSIS — Z833 Family history of diabetes mellitus: Secondary | ICD-10-CM | POA: Diagnosis not present

## 2019-02-16 DIAGNOSIS — J189 Pneumonia, unspecified organism: Secondary | ICD-10-CM

## 2019-02-16 DIAGNOSIS — Y92013 Bedroom of single-family (private) house as the place of occurrence of the external cause: Secondary | ICD-10-CM | POA: Diagnosis not present

## 2019-02-16 DIAGNOSIS — Z87891 Personal history of nicotine dependence: Secondary | ICD-10-CM | POA: Diagnosis not present

## 2019-02-16 DIAGNOSIS — J1282 Pneumonia due to coronavirus disease 2019: Secondary | ICD-10-CM | POA: Diagnosis present

## 2019-02-16 DIAGNOSIS — N1831 Chronic kidney disease, stage 3a: Secondary | ICD-10-CM | POA: Diagnosis present

## 2019-02-16 DIAGNOSIS — Z950 Presence of cardiac pacemaker: Secondary | ICD-10-CM | POA: Diagnosis not present

## 2019-02-16 DIAGNOSIS — J069 Acute upper respiratory infection, unspecified: Secondary | ICD-10-CM | POA: Diagnosis present

## 2019-02-16 DIAGNOSIS — G4730 Sleep apnea, unspecified: Secondary | ICD-10-CM | POA: Diagnosis present

## 2019-02-16 DIAGNOSIS — W06XXXA Fall from bed, initial encounter: Secondary | ICD-10-CM | POA: Diagnosis present

## 2019-02-16 DIAGNOSIS — I482 Chronic atrial fibrillation, unspecified: Secondary | ICD-10-CM | POA: Diagnosis present

## 2019-02-16 DIAGNOSIS — S79912A Unspecified injury of left hip, initial encounter: Secondary | ICD-10-CM | POA: Diagnosis not present

## 2019-02-16 DIAGNOSIS — M25552 Pain in left hip: Secondary | ICD-10-CM | POA: Diagnosis not present

## 2019-02-16 DIAGNOSIS — K219 Gastro-esophageal reflux disease without esophagitis: Secondary | ICD-10-CM | POA: Diagnosis present

## 2019-02-16 DIAGNOSIS — I5032 Chronic diastolic (congestive) heart failure: Secondary | ICD-10-CM | POA: Diagnosis present

## 2019-02-16 DIAGNOSIS — Z66 Do not resuscitate: Secondary | ICD-10-CM | POA: Diagnosis present

## 2019-02-16 DIAGNOSIS — Z955 Presence of coronary angioplasty implant and graft: Secondary | ICD-10-CM | POA: Diagnosis not present

## 2019-02-16 DIAGNOSIS — Z8249 Family history of ischemic heart disease and other diseases of the circulatory system: Secondary | ICD-10-CM | POA: Diagnosis not present

## 2019-02-16 DIAGNOSIS — Z7984 Long term (current) use of oral hypoglycemic drugs: Secondary | ICD-10-CM | POA: Diagnosis not present

## 2019-02-16 DIAGNOSIS — I252 Old myocardial infarction: Secondary | ICD-10-CM | POA: Diagnosis not present

## 2019-02-16 LAB — CBC WITH DIFFERENTIAL/PLATELET
Abs Immature Granulocytes: 0.14 10*3/uL — ABNORMAL HIGH (ref 0.00–0.07)
Basophils Absolute: 0 10*3/uL (ref 0.0–0.1)
Basophils Relative: 0 %
Eosinophils Absolute: 0 10*3/uL (ref 0.0–0.5)
Eosinophils Relative: 0 %
HCT: 33.2 % — ABNORMAL LOW (ref 39.0–52.0)
Hemoglobin: 10.1 g/dL — ABNORMAL LOW (ref 13.0–17.0)
Immature Granulocytes: 1 %
Lymphocytes Relative: 6 %
Lymphs Abs: 1.1 10*3/uL (ref 0.7–4.0)
MCH: 24.1 pg — ABNORMAL LOW (ref 26.0–34.0)
MCHC: 30.4 g/dL (ref 30.0–36.0)
MCV: 79.2 fL — ABNORMAL LOW (ref 80.0–100.0)
Monocytes Absolute: 0.9 10*3/uL (ref 0.1–1.0)
Monocytes Relative: 5 %
Neutro Abs: 16.2 10*3/uL — ABNORMAL HIGH (ref 1.7–7.7)
Neutrophils Relative %: 88 %
Platelets: 151 10*3/uL (ref 150–400)
RBC: 4.19 MIL/uL — ABNORMAL LOW (ref 4.22–5.81)
RDW: 18.6 % — ABNORMAL HIGH (ref 11.5–15.5)
WBC: 18.2 10*3/uL — ABNORMAL HIGH (ref 4.0–10.5)
nRBC: 0 % (ref 0.0–0.2)

## 2019-02-16 LAB — URINALYSIS, ROUTINE W REFLEX MICROSCOPIC
Bilirubin Urine: NEGATIVE
Glucose, UA: NEGATIVE mg/dL
Ketones, ur: NEGATIVE mg/dL
Leukocytes,Ua: NEGATIVE
Nitrite: NEGATIVE
Protein, ur: NEGATIVE mg/dL
RBC / HPF: >50 RBC/hpf — ABNORMAL HIGH (ref 0–5)
Specific Gravity, Urine: 1.01 (ref 1.005–1.030)
pH: 5 (ref 5.0–8.0)

## 2019-02-16 LAB — PROTIME-INR
INR: 6.5 (ref 0.8–1.2)
INR: 5.4 (ref 0.8–1.2)
Prothrombin Time: 57.2 seconds — ABNORMAL HIGH (ref 11.4–15.2)
Prothrombin Time: 49.7 seconds — ABNORMAL HIGH (ref 11.4–15.2)

## 2019-02-16 LAB — LACTIC ACID, PLASMA
Lactic Acid, Venous: 1.4 mmol/L (ref 0.5–1.9)
Lactic Acid, Venous: 1.1 mmol/L (ref 0.5–1.9)

## 2019-02-16 LAB — BASIC METABOLIC PANEL
Anion gap: 9 (ref 5–15)
BUN: 26 mg/dL — ABNORMAL HIGH (ref 8–23)
CO2: 31 mmol/L (ref 22–32)
Calcium: 8.1 mg/dL — ABNORMAL LOW (ref 8.9–10.3)
Chloride: 92 mmol/L — ABNORMAL LOW (ref 98–111)
Creatinine, Ser: 1.22 mg/dL (ref 0.61–1.24)
GFR calc Af Amer: >60 mL/min (ref >60)
GFR calc non Af Amer: 58 mL/min — ABNORMAL LOW (ref >60)
Glucose, Bld: 171 mg/dL — ABNORMAL HIGH (ref 70–99)
Potassium: 3.6 mmol/L (ref 3.5–5.1)
Sodium: 132 mmol/L — ABNORMAL LOW (ref 135–145)

## 2019-02-16 LAB — ABO/RH: ABO/RH(D): B POS

## 2019-02-16 LAB — C-REACTIVE PROTEIN: CRP: 11 mg/dL — ABNORMAL HIGH (ref <1.0)

## 2019-02-16 LAB — GLUCOSE, CAPILLARY
Glucose-Capillary: 236 mg/dL — ABNORMAL HIGH (ref 70–99)
Glucose-Capillary: 213 mg/dL — ABNORMAL HIGH (ref 70–99)

## 2019-02-16 LAB — D-DIMER, QUANTITATIVE: D-Dimer, Quant: 1.39 ug/mL-FEU — ABNORMAL HIGH (ref 0.00–0.50)

## 2019-02-16 LAB — PROCALCITONIN: Procalcitonin: 0.13 ng/mL

## 2019-02-16 LAB — FERRITIN: Ferritin: 140 ng/mL (ref 24–336)

## 2019-02-16 LAB — FIBRINOGEN: Fibrinogen: 546 mg/dL — ABNORMAL HIGH (ref 210–475)

## 2019-02-16 MED ORDER — SODIUM CHLORIDE 0.9 % IV SOLN
200.0000 mg | Freq: Once | INTRAVENOUS | Status: AC
  Administered 2019-02-16: 200 mg via INTRAVENOUS
  Filled 2019-02-16: qty 40

## 2019-02-16 MED ORDER — ONDANSETRON HCL 4 MG PO TABS: 4.0000 mg | ORAL_TABLET | Freq: Four times a day (QID) | ORAL | Status: DC | PRN

## 2019-02-16 MED ORDER — SODIUM CHLORIDE 0.9 % IV SOLN
100.0000 mg | Freq: Every day | INTRAVENOUS | Status: DC
  Filled 2019-02-16: qty 20

## 2019-02-16 MED ORDER — TAMSULOSIN HCL 0.4 MG PO CAPS
0.4000 mg | ORAL_CAPSULE | Freq: Every day | ORAL | Status: DC
  Administered 2019-02-16 – 2019-02-19 (×4): 0.4 mg via ORAL
  Filled 2019-02-16 (×4): qty 1

## 2019-02-16 MED ORDER — PANTOPRAZOLE SODIUM 40 MG PO TBEC
40.0000 mg | DELAYED_RELEASE_TABLET | Freq: Every day | ORAL | Status: DC
  Administered 2019-02-16 – 2019-02-19 (×4): 40 mg via ORAL
  Filled 2019-02-16 (×4): qty 1

## 2019-02-16 MED ORDER — TOCILIZUMAB 400 MG/20ML IV SOLN
800.0000 mg | Freq: Once | INTRAVENOUS | Status: AC
  Administered 2019-02-16: 800 mg via INTRAVENOUS
  Filled 2019-02-16: qty 40

## 2019-02-16 MED ORDER — IPRATROPIUM-ALBUTEROL 20-100 MCG/ACT IN AERS
2.0000 | INHALATION_SPRAY | Freq: Four times a day (QID) | RESPIRATORY_TRACT | Status: DC
  Administered 2019-02-16 (×4): 2 via RESPIRATORY_TRACT

## 2019-02-16 MED ORDER — PIPERACILLIN-TAZOBACTAM 3.375 G IVPB 30 MIN
3.3750 g | Freq: Once | INTRAVENOUS | Status: AC
  Administered 2019-02-16: 3.375 g via INTRAVENOUS
  Filled 2019-02-16: qty 50

## 2019-02-16 MED ORDER — ASCORBIC ACID 500 MG PO TABS
500.0000 mg | ORAL_TABLET | Freq: Every day | ORAL | Status: DC
  Administered 2019-02-16 – 2019-02-19 (×4): 500 mg via ORAL
  Filled 2019-02-16 (×4): qty 1

## 2019-02-16 MED ORDER — LABETALOL HCL 5 MG/ML IV SOLN: 10.0000 mg | INTRAVENOUS | Status: DC | PRN

## 2019-02-16 MED ORDER — ONDANSETRON HCL 4 MG/2ML IJ SOLN: 4.0000 mg | Freq: Four times a day (QID) | INTRAMUSCULAR | Status: DC | PRN

## 2019-02-16 MED ORDER — CARVEDILOL 12.5 MG PO TABS
12.5000 mg | ORAL_TABLET | Freq: Two times a day (BID) | ORAL | Status: DC
  Administered 2019-02-16 – 2019-02-19 (×7): 12.5 mg via ORAL
  Filled 2019-02-16 (×7): qty 1

## 2019-02-16 MED ORDER — DEXAMETHASONE SODIUM PHOSPHATE 10 MG/ML IJ SOLN
6.0000 mg | INTRAMUSCULAR | Status: DC
  Administered 2019-02-16 – 2019-02-19 (×4): 6 mg via INTRAVENOUS
  Filled 2019-02-16 (×5): qty 1

## 2019-02-16 MED ORDER — ACETAMINOPHEN 650 MG RE SUPP: 650.0000 mg | Freq: Four times a day (QID) | RECTAL | Status: DC | PRN

## 2019-02-16 MED ORDER — VANCOMYCIN HCL IN DEXTROSE 1-5 GM/200ML-% IV SOLN
1000.0000 mg | Freq: Once | INTRAVENOUS | Status: AC
  Administered 2019-02-16: 1000 mg via INTRAVENOUS
  Filled 2019-02-16: qty 200

## 2019-02-16 MED ORDER — GUAIFENESIN-DM 100-10 MG/5ML PO SYRP: 10.0000 mL | ORAL_SOLUTION | ORAL | Status: DC | PRN

## 2019-02-16 MED ORDER — WARFARIN - PHARMACIST DOSING INPATIENT: Freq: Every day | Status: DC

## 2019-02-16 MED ORDER — HYDROCOD POLST-CPM POLST ER 10-8 MG/5ML PO SUER
5.0000 mL | Freq: Two times a day (BID) | ORAL | Status: DC | PRN
  Filled 2019-02-16 (×3): qty 5

## 2019-02-16 MED ORDER — PHYTONADIONE 5 MG PO TABS
2.5000 mg | ORAL_TABLET | Freq: Once | ORAL | Status: AC
  Administered 2019-02-16: 2.5 mg via ORAL
  Filled 2019-02-16: qty 1

## 2019-02-16 MED ORDER — ZINC SULFATE 220 (50 ZN) MG PO CAPS
220.0000 mg | ORAL_CAPSULE | Freq: Every day | ORAL | Status: DC
  Administered 2019-02-16 – 2019-02-19 (×4): 220 mg via ORAL
  Filled 2019-02-16 (×4): qty 1

## 2019-02-16 MED ORDER — ACETAMINOPHEN 325 MG PO TABS
650.0000 mg | ORAL_TABLET | Freq: Four times a day (QID) | ORAL | Status: DC | PRN
  Administered 2019-02-16: 650 mg via ORAL
  Filled 2019-02-16: qty 2

## 2019-02-16 MED ORDER — IPRATROPIUM-ALBUTEROL 20-100 MCG/ACT IN AERS: 2.0000 | INHALATION_SPRAY | Freq: Four times a day (QID) | RESPIRATORY_TRACT | Status: DC | PRN

## 2019-02-16 MED ORDER — POTASSIUM CHLORIDE IN NACL 40-0.9 MEQ/L-% IV SOLN
INTRAVENOUS | Status: AC
  Administered 2019-02-16: 125 mL/h via INTRAVENOUS
  Filled 2019-02-16 (×2): qty 1000

## 2019-02-16 NOTE — Progress Notes (Signed)
Pharmacy Note - Remdesivir Dosing  O:  ALT: CMP pending CXR:  likely progressive pneumonia Requiring supplemental O2:  94% on [address]   A/P:  Patient had first course of  remdesivir from 02/01/19 to 02/05/19 Begin 2nd course of remdesivir 200 mg IV x 1, followed by 100 mg IV daily x 4 days  Monitor ALT, clinical progress  Despina Pole, Pharm. D. Clinical Pharmacist 02/16/2019 5:40 AM

## 2019-02-16 NOTE — ED Notes (Signed)
Date and time results received: 02/16/19 1554 (use smartphrase ".now" to insert current time)  Test: INR Critical Value: 5.4  Name of Provider Notified: Emokpae,MD  Orders Received? Or Actions Taken?:

## 2019-02-16 NOTE — Progress Notes (Signed)
ANTICOAGULATION CONSULT NOTE - Initial Consult  Pharmacy Consult for warfarin dosing Indication:  Atrial fibrillation w/ permanent pacemaker  Allergies  Allergen Reactions  . Crestor [Rosuvastatin Calcium] Other (See Comments)    Urination of blood  . Latex Rash and Other (See Comments)    Gloves cause welts on hands!!    Patient Measurements: Height: 5\' 11"  (180.3 cm) Weight: 218 lb 14.7 oz (99.3 kg) IBW/kg (Calculated) : 75.3 Heparin Dosing Weight:  HEPARIN DW (KG): 95.7  Vital Signs: Temp: 102.8 F (39.3 C) (01/16 2247) Temp Source: Oral (01/16 2247) BP: 106/56 (01/17 0530) Pulse Rate: 57 (01/17 0530)  Labs: Recent Labs    02/16/19 0051 02/16/19 0052  HGB  --  10.1*  HCT  --  33.2*  PLT  --  151  LABPROT 57.2*  --   INR 6.5*  --   CREATININE  --  1.22    Estimated Creatinine Clearance: 64.8 mL/min (by C-G formula based on SCr of 1.22 mg/dL).   Assessment: Pharmacy consulted to dose warfarin for this 74 yo male with exacerbation of CoVid-19 pneumonia. He takes warfarin at home chronically for atrial fibrillation and has a permanent pacemaker. Unable to verify patient's home warfarin regimen at this time and patient is unable to tell when he had his last dose of warfarin.   1/17  INR:   high at 6.5, so patient was given phytonadione 2.5mg  po x1 dose.  Goal of Therapy:  INR 2-3 Monitor platelets by anticoagulation protocol: Yes   Plan:  Hold warfarin today for supra-therapeutic INR of 6.5 Clarify patient's home warfarin regimen resume with lower dose when able Daily INR and CBC Monitor for signs and symptoms of bleeding    Despina Pole, Pharm. D. Clinical Pharmacist 02/16/2019 6:05 AM

## 2019-02-16 NOTE — ED Notes (Signed)
ED TO INPATIENT HANDOFF REPORT  ED Nurse Name and Phone #: [phone]  S Name/Age/Gender [name] 74 y.o. male Room/Bed: APA16A/APA16A  Code Status   Code Status: DNR  Home/SNF/Other Home Patient oriented to: self Is this baseline?   Triage Complete: Triage complete  Chief Complaint HCAP (healthcare-associated pneumonia) [J18.9]  Triage Note [facility] - pt states he does not why he is. EMS states that he was recently d/c'd with pneumonia, has been having diarrhea today, and slid off the bed onto the floor. Pt also c/o body aches. Pt was covid + on 02/01/19    Allergies Allergies  Allergen Reactions  . Crestor [Rosuvastatin Calcium] Other (See Comments)    Urination of blood  . Latex Rash and Other (See Comments)    Gloves cause welts on hands!!    Level of Care/Admitting Diagnosis ED Disposition    ED Disposition Condition [place]: [facility] [id number]  Level of Care: Telemetry [5]  Covid Evaluation: Confirmed COVID Positive  Diagnosis: HCAP (healthcare-associated pneumonia) [id number]  Admitting Physician: Reubin Milan [id number]  Attending Physician: Reubin Milan [id number]  Estimated length of stay: past midnight tomorrow  Certification:: I certify this patient will need inpatient services for at least 2 midnights       B Medical/Surgery History Past Medical History:  Diagnosis Date  . Aortic root dilatation ([place]) 02/02/2011  . Arthritis    "lower back; going back down both my sciatic nerves"  . 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 BENIGN PROSTATIC HYPERTROPHY 08/29/2006   takes Flomax daily  . 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 Cataracts, bilateral    immature  . CHF (congestive heart failure) (H[facility])    takes Lasix daily  . Chronic back pain    HNP   . CORONARY ARTERY DISEASE 08/29/2006   takes Coumadin daily  . DEPRESSION 08/29/2006  . DIABETES MELLITUS, TYPE II 08/29/2006   takes  Metformin,Januvia,and Glipizide  daily  . DIASTOLIC HEART FAILURE, CHRONIC 06/09/2008  . GOUT 04/22/2007   takes Allopurinol daily  . History of migraine    81yrs ago  . HYPERLIPIDEMIA 08/29/2006   takes Atorvastatin daily  . HYPERTENSION 08/29/2006   takes Lisinopril daily  . INSOMNIA-SLEEP DISORDER-UNSPEC 10/23/2007  . Left lumbar radiculopathy 05/30/2010  . LUMBAR RADICULOPATHY, RIGHT 06/10/2007  . Muscle spasm    takes Zanaflex daily  . Myocardial infarction ([place]) 12/27/10   "I've had several MIs"  . 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 PACEMAKER, PERMANENT 03/04/2008   pt denies this date  . Peripheral neuropathy    takes Gabapentin daily  . Presence of permanent cardiac pacemaker   . Shortness of breath dyspnea    "all my life" with exertion  . Sleep apnea    "if I lay flat I quit breathing; HOB up I'm fine"   Past Surgical History:  Procedure Laterality Date  . COLONOSCOPY    . CORONARY ANGIOPLASTY WITH STENT PLACEMENT  12/27/10   "I've had a total of 9 cardiac stents put in"  . CORONARY ARTERY BYPASS GRAFT  1992   CABG X 2  . ESOPHAGOGASTRODUODENOSCOPY N/A 12/01/2013   Procedure: ESOPHAGOGASTRODUODENOSCOPY (EGD);  Surgeon: Lafayette Dragon, MD;  Location: [facility];  Service: Endoscopy;  Laterality: N/A;  . INSERT / REPLACE / REMOVE PACEMAKER  ~ 2004   initial pacemaker placement  . INSERT / REPLACE / REMOVE PACEMAKER  10/2009   generator change  . LUMBAR  LAMINECTOMY/DECOMPRESSION MICRODISCECTOMY Right 03/23/2014   Procedure: LAMINECTOMY AND FORAMINOTOMY RIGHT LUMBAR THREE-FOUR,LUMBAR FOUR-FIVE, LUMBAR FIVE-SACRAL ONE;  Surgeon: Charlie Pitter, MD;  Location: [facility];  Service: Neurosurgery;  Laterality: Right;  right  . LUMBAR LAMINECTOMY/DECOMPRESSION MICRODISCECTOMY Left 12/01/2014   Procedure: Left Lumbar Three-Four, Lumbar Four-Five Laminectomy and Foraminotomy;  Surgeon: Earnie Larsson, MD;  Location: [facility];  Service: Neurosurgery;  Laterality: Left;  . s/p  left arm surgury after work accident  1991   "2000# steel fell on it"  . s/p right hand surgury for foreign object  1970's   "piece of wood went in my hand; had to get that out"     A IV Location/Drains/Wounds Patient Lines/Drains/Airways Status   Active Line/Drains/Airways    Name:   Placement date:   Placement time:   Site:   Days:   Peripheral IV 02/16/19 Right Antecubital   02/16/19    0354    Antecubital   less than 1   Peripheral IV 02/16/19 Right Forearm   02/16/19    0811    Forearm   less than 1          Intake/Output Last 24 hours No intake or output data in the 24 hours ending 02/16/19 1542  Labs/Imaging Results for orders placed or performed during the hospital encounter of 02/15/19 (from the past 48 hour(s))  Protime-INR     Status: Abnormal   Collection Time: 02/16/19 12:51 AM  Result Value Ref Range   Prothrombin Time 57.2 (H) 11.4 - 15.2 seconds   INR 6.5 (HH) 0.8 - 1.2    Comment: RESULT REPEATED AND VERIFIED CRITICAL RESULT CALLED TO, READ BACK BY AND VERIFIED WITH: PRUITT,G @ 0218 ON 02/16/19 BY JUW Performed at [facility], [address], [place], [place] [address]   CBC with Differential     Status: Abnormal   Collection Time: 02/16/19 12:52 AM  Result Value Ref Range   WBC 18.2 (H) 4.0 - 10.5 K/uL   RBC 4.19 (L) 4.22 - 5.81 MIL/uL   Hemoglobin 10.1 (L) 13.0 - 17.0 g/dL   HCT 33.2 (L) 39.0 - 52.0 %   MCV 79.2 (L) 80.0 - 100.0 fL   MCH 24.1 (L) 26.0 - 34.0 pg   MCHC 30.4 30.0 - 36.0 g/dL   RDW 18.6 (H) 11.5 - 15.5 %   Platelets 151 150 - 400 K/uL   nRBC 0.0 0.0 - 0.2 %   Neutrophils Relative % 88 %   Neutro Abs 16.2 (H) 1.7 - 7.7 K/uL   Lymphocytes Relative 6 %   Lymphs Abs 1.1 0.7 - 4.0 K/uL   Monocytes Relative 5 %   Monocytes Absolute 0.9 0.1 - 1.0 K/uL   Eosinophils Relative 0 %   Eosinophils Absolute 0.0 0.0 - 0.5 K/uL   Basophils Relative 0 %   Basophils Absolute 0.0 0.0 - 0.1 K/uL   Immature Granulocytes 1 %   Abs Immature  Granulocytes 0.14 (H) 0.00 - 0.07 K/uL    Comment: Performed at [facility], [address], [place], [place] [id number]  Basic metabolic panel     Status: Abnormal   Collection Time: 02/16/19 12:52 AM  Result Value Ref Range   Sodium 132 (L) 135 - 145 mmol/L   Potassium 3.6 3.5 - 5.1 mmol/L   Chloride 92 (L) 98 - 111 mmol/L   CO2 31 22 - 32 mmol/L   Glucose, Bld 171 (H) 70 - 99 mg/dL   BUN 26 (  H) 8 - 23 mg/dL   Creatinine, Ser 1.22 0.61 - 1.24 mg/dL   Calcium 8.1 (L) 8.9 - 10.3 mg/dL   GFR calc non Af Amer 58 (L) >60 mL/min   GFR calc Af Amer >60 >60 mL/min   Anion gap 9 5 - 15    Comment: Performed at [facility], [address], [place], [place] [address]  Lactic acid, plasma     Status: None   Collection Time: 02/16/19 12:53 AM  Result Value Ref Range   Lactic Acid, Venous 1.4 0.5 - 1.9 mmol/L    Comment: Performed at [facility], [address], [place], [place] [address]  Blood culture (routine x 2)     Status: None (Preliminary result)   Collection Time: 02/16/19 12:53 AM   Specimen: Right Antecubital; Blood  Result Value Ref Range   Specimen Description RIGHT ANTECUBITAL    Special Requests      BOTTLES DRAWN AEROBIC AND ANAEROBIC Blood Culture adequate volume Performed at [facility], [address] [place], [place] [address]    Culture PENDING    Report Status PENDING   C-reactive protein     Status: Abnormal   Collection Time: 02/16/19 12:53 AM  Result Value Ref Range   CRP 11.0 (H) <1.0 mg/dL    Comment: Performed at [facility], [address] [place], [place] [address]  Ferritin     Status: None   Collection Time: 02/16/19 12:53 AM  Result Value Ref Range   Ferritin 140 24 - 336 ng/mL    Comment: Performed at [facility], [address] [place], [place] [address]  Urinalysis, Routine w reflex microscopic     Status: Abnormal   Collection Time: 02/16/19  2:55 AM  Result Value Ref Range   Color, Urine YELLOW YELLOW   APPearance HAZY (A) CLEAR   Specific  Gravity, Urine 1.010 1.005 - 1.030   pH 5.0 5.0 - 8.0   Glucose, UA NEGATIVE NEGATIVE mg/dL   Hgb urine dipstick LARGE (A) NEGATIVE   Bilirubin Urine NEGATIVE NEGATIVE   Ketones, ur NEGATIVE NEGATIVE mg/dL   Protein, ur NEGATIVE NEGATIVE mg/dL   Nitrite NEGATIVE NEGATIVE   Leukocytes,Ua NEGATIVE NEGATIVE   RBC / HPF >50 (H) 0 - 5 RBC/hpf   WBC, UA 11-20 0 - 5 WBC/hpf   Bacteria, UA RARE (A) NONE SEEN   Mucus PRESENT     Comment: Performed at [facility], [address] [place], [place] [address]  D-dimer, quantitative (not at [facility])     Status: Abnormal   Collection Time: 02/16/19  3:07 AM  Result Value Ref Range   D-Dimer, Quant 1.39 (H) 0.00 - 0.50 ug/mL-FEU    Comment: (NOTE) At the manufacturer cut-off of 0.50 ug/mL FEU, this assay has been documented to exclude PE with a sensitivity and negative predictive value of 97 to 99%.  At this time, this assay has not been approved by the FDA to exclude DVT/VTE. Results should be correlated with clinical presentation. Performed at [facility], [address], [place], [place] [address]   Fibrinogen     Status: Abnormal   Collection Time: 02/16/19  3:07 AM  Result Value Ref Range   Fibrinogen 546 (H) 210 - 475 mg/dL    Comment: Performed at [facility], [address] [place], [place] [address]  Procalcitonin     Status: None   Collection Time: 02/16/19  3:07 AM  Result Value Ref Range   Procalcitonin 0.13 ng/mL  Comment:        Interpretation: PCT (Procalcitonin) <= 0.5 ng/mL: Systemic infection (sepsis) is not likely. Local bacterial infection is possible. (NOTE)       Sepsis PCT Algorithm           Lower Respiratory Tract                                      Infection PCT Algorithm    ----------------------------     ----------------------------         PCT < 0.25 ng/mL                PCT < 0.10 ng/mL         Strongly encourage             Strongly discourage   discontinuation of antibiotics    initiation of  antibiotics    ----------------------------     -----------------------------       PCT 0.25 - 0.50 ng/mL            PCT 0.10 - 0.25 ng/mL               OR       >80% decrease in PCT            Discourage initiation of                                            antibiotics      Encourage discontinuation           of antibiotics    ----------------------------     -----------------------------         PCT >= 0.50 ng/mL              PCT 0.26 - 0.50 ng/mL               AND        <80% decrease in PCT             Encourage initiation of                                             antibiotics       Encourage continuation           of antibiotics    ----------------------------     -----------------------------        PCT >= 0.50 ng/mL                  PCT > 0.50 ng/mL               AND         increase in PCT                  Strongly encourage                                      initiation of antibiotics    Strongly encourage escalation           of antibiotics                                     -----------------------------  PCT <= 0.25 ng/mL                                                 OR                                        > 80% decrease in PCT                                     Discontinue / Do not initiate                                             antibiotics Performed at [facility], [address], [place], [place] [address]   Lactic acid, plasma     Status: None   Collection Time: 02/16/19  5:45 AM  Result Value Ref Range   Lactic Acid, Venous 1.1 0.5 - 1.9 mmol/L    Comment: Performed at [facility], [address] [place], [place] [address]  ABO/Rh     Status: None   Collection Time: 02/16/19  6:12 AM  Result Value Ref Range   ABO/RH(D)      B POS Performed at [facility], [address] [place],  [address]    *Note: Due to a large number of results and/or encounters for the requested time period, some  results have not been displayed. A complete set of results can be found in Results Review.   DG Chest Portable 1 View  Result Date: 02/15/2019 CLINICAL DATA:  Fever. COVID positive 02/01/2019 EXAM: PORTABLE CHEST 1 VIEW COMPARISON:  Radiograph 02/01/2019. FINDINGS: Post median sternotomy. Left-sided pacemaker in place. Cardiomegaly mediastinal contours are unchanged. Low lung volumes persist. Patchy bilateral airspace disease, slightly worsened in the interim. No pulmonary edema, large pleural effusion or pneumothorax. No acute osseous abnormalities are seen. IMPRESSION: 1. Low lung volumes with slight worsening patchy bilateral airspace disease over the past 2 weeks, likely progressive pneumonia. 2. Stable cardiomegaly with pacemaker in place. Electronically Signed   By: Keith Rake M.D.   On: 02/15/2019 23:57    Pending Labs Unresulted Labs (From admission, onward)    Start     Ordered   02/17/19 0500  Comprehensive metabolic panel  Daily,   R     02/16/19 0306   02/17/19 0500  CBC with Differential/Platelet  Daily,   R     02/16/19 0306   02/17/19 0500  C-reactive protein  Daily,   R     02/16/19 0306   02/17/19 0500  D-dimer, quantitative (not at [facility])  Daily,   R     02/16/19 0306   02/17/19 0500  Ferritin  Daily,   R     02/16/19 0306   02/17/19 0500  Magnesium  Daily,   R     02/16/19 0306   02/17/19 0500  Phosphorus  Daily,   R     02/16/19 0306   02/17/19 0500  Protime-INR  Daily,   R     02/16/19 0557   02/16/19 1500  INR  Once-Timed,   STAT    Question:  Specimen collection method  Answer:  IV Team   02/16/19 1430   02/16/19 0224  Blood culture (routine x 2)  BLOOD CULTURE X 2,   STAT     02/16/19 0223          Vitals/Pain Today's Vitals   02/16/19 1400 02/16/19 1430 02/16/19 1500 02/16/19 1515  BP: 125/67 103/65 119/69   Pulse: (!) 59 (!) 59 61 (!) 59  Resp:      Temp:      TempSrc:      SpO2: 99% 95% 98% 97%  Weight:      Height:        Isolation  Precautions Airborne and Contact precautions  Medications Medications  ascorbic acid (VITAMIN C) tablet 500 mg (500 mg Oral Given 02/16/19 1135)  zinc sulfate capsule 220 mg (220 mg Oral Given 02/16/19 1135)  guaiFENesin-dextromethorphan (ROBITUSSIN DM) 100-10 MG/5ML syrup 10 mL (has no administration in time range)  chlorpheniramine-HYDROcodone (TUSSIONEX) 10-8 MG/5ML suspension 5 mL (has no administration in time range)  Ipratropium-Albuterol (COMBIVENT) respimat 2 puff (2 puffs Inhalation Given 02/16/19 1404)  ondansetron (ZOFRAN) tablet 4 mg (has no administration in time range)    Or  ondansetron (ZOFRAN) injection 4 mg (has no administration in time range)  acetaminophen (TYLENOL) tablet 650 mg (650 mg Oral Given 02/16/19 0358)    Or  acetaminophen (TYLENOL) suppository 650 mg ( Rectal See Alternative 02/16/19 0358)  0.9 % NaCl with KCl 40 mEq / L  infusion (125 mL/hr Intravenous New Bag/Given 02/16/19 0900)  dexamethasone (DECADRON) injection 6 mg (6 mg Intravenous Given 02/16/19 0634)  Warfarin - Pharmacist Dosing Inpatient (0 each Does not apply Hold 02/16/19 1403)  phytonadione (VITAMIN K) tablet 2.5 mg (2.5 mg Oral Given 02/16/19 0249)  vancomycin (VANCOCIN) IVPB 1000 mg/200 mL premix (0 mg Intravenous Stopped 02/16/19 0502)  piperacillin-tazobactam (ZOSYN) IVPB 3.375 g (0 g Intravenous Stopped 02/16/19 0503)  tocilizumab (ACTEMRA) 800 mg in sodium chloride 0.9 % 100 mL infusion (0 mg Intravenous Stopped 02/16/19 0900)  remdesivir 200 mg in sodium chloride 0.9% 250 mL IVPB (0 mg Intravenous Stopped 02/16/19 0915)    Mobility walks High fall risk   Focused Assessments    R Recommendations: See Admitting Provider Note  Report given to:   Additional Notes:

## 2019-02-16 NOTE — ED Notes (Addendum)
   Disregard note. Charted in error

## 2019-02-16 NOTE — ED Provider Notes (Signed)
[facility] EMERGENCY DEPARTMENT Provider Note   CSN: [id number] Arrival date & time: 02/15/19  2232     History Chief Complaint  Patient presents with  . Fall    [name] is a 74 y.o. male.  HPI     This is a 74 year old male with a history of coronary artery disease, CHF, diabetes and recent Covid positive who presents after slipping out of bed.  Per EMS he was recently diagnosed with pneumonia and was Covid positive on January 2.  Patient reports that he was putting on his pajamas when he slipped out of bed.  He remembers slipping.  He denies hitting his head or loss of consciousness.  He denies any pain.  He denies chest pain, shortness of breath, cough, abdominal pain, nausea, vomiting, diarrhea.  He does report chills.  He is awake, alert, and oriented.  He lives at home with his wife.  Past Medical History:  Diagnosis Date  . Aortic root dilatation ([place]) 02/02/2011  . Arthritis    "lower back; going back down both my sciatic nerves"  . 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 BENIGN PROSTATIC HYPERTROPHY 08/29/2006   takes Flomax daily  . 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 Cataracts, bilateral    immature  . CHF (congestive heart failure) (HCC)    takes Lasix daily  . Chronic back pain    HNP   . CORONARY ARTERY DISEASE 08/29/2006   takes Coumadin daily  . DEPRESSION 08/29/2006  . DIABETES MELLITUS, TYPE II 08/29/2006   takes Metformin,Januvia,and Glipizide  daily  . DIASTOLIC HEART FAILURE, CHRONIC 06/09/2008  . GOUT 04/22/2007   takes Allopurinol daily  . History of migraine    56yr ago  . HYPERLIPIDEMIA 08/29/2006   takes Atorvastatin daily  . HYPERTENSION 08/29/2006   takes Lisinopril daily  . INSOMNIA-SLEEP DISORDER-UNSPEC 10/23/2007  . Left lumbar radiculopathy 05/30/2010  . LUMBAR RADICULOPATHY, RIGHT 06/10/2007  . Muscle spasm    takes Zanaflex daily  . Myocardial infarction ([place] 12/27/10   "I've had several MIs"  . NEOPLASM,  MALIGNANT, PROSTATE 11/26/2006  . PACEMAKER, PERMANENT 03/04/2008   pt denies this date  . Peripheral neuropathy    takes Gabapentin daily  . Presence of permanent cardiac pacemaker   . Shortness of breath dyspnea    "all my life" with exertion  . Sleep apnea    "if I lay flat I quit breathing; HOB up I'm fine"    Patient Active Problem List   Diagnosis Date Noted  . Acute renal failure superimposed on stage 3a chronic kidney disease ([facility] 02/02/2019  . Complete heart block ([place] 02/02/2019  . Atrial fibrillation, chronic ([place] 02/02/2019  . COVID-19 02/01/2019  . Esophageal dysmotility 08/15/2018  . Gastroesophageal reflux disease 08/15/2018  . Nail problem 07/10/2018  . Right knee pain 03/13/2018  . Degenerative joint disease of knee, left 03/13/2018  . Nondisplaced longitudinal fracture of right patella, initial encounter for closed fracture 03/13/2018  . Chondrocalcinosis 02/05/2018  . Acute respiratory failure with hypoxia ([place] 01/16/2018  . Elevated troponin 01/16/2018  . Weakness 12/21/2017  . Hypersomnolence 09/20/2017  . Acute renal failure superimposed on stage 2 chronic kidney disease ([facility] 06/16/2017  . Left cervical radiculopathy 02/07/2017  . Trochanteric bursitis, right hip 01/01/2017  . Right hip pain 12/26/2016  . Knee instability, right 11/03/2016  . Urinary frequency 10/20/2016  . Right foot pain 09/29/2016  . Prepatellar bursitis 06/29/2016  . Left knee pain 06/28/2016  .  Ingrown nail 06/28/2016  . Acute gouty arthritis 05/07/2016  . Left shoulder pain 02/09/2016  . Memory loss 01/19/2016  . Ingrown toenail 09/13/2015  . Renal insufficiency 09/08/2015  . Right leg swelling 07/13/2015  . Leg wound, right 06/15/2015  . Rash 06/06/2015  . Anemia 05/24/2015  . Cellulitis of leg, right 05/13/2015  . Greater trochanteric bursitis of left hip 08/12/2014  . Left leg pain 07/15/2014  . Spinal stenosis, lumbar region, with neurogenic claudication 03/23/2014   . Lumbar stenosis with neurogenic claudication 03/23/2014  . Long term current use of anticoagulant therapy 02/27/2014  . Dysphagia, pharyngoesophageal phase 12/01/2013  . 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 Dysphagia 11/11/2013  . Lumbago 11/03/2013  . Low back pain radiating to right leg 11/03/2013  . Abnormality of gait 11/03/2013  . Difficulty walking 11/03/2013  . Loss of weight 10/29/2013  . Atrial fibrillation ([place]) 08/26/2013  . Chest pain 04/05/2013  . Plantar fasciitis of right foot 02/18/2013  . Bruit 08/21/2012  . Sciatica of left side 08/15/2012  . Left hip pain 08/15/2012  . Headache(784.0) 05/03/2012  . Increased prostate specific antigen (PSA) velocity 11/11/2011  . Right sided sciatica 11/10/2011  . Chronic low back pain 05/03/2011  . Aortic root dilatation ([place]) 02/02/2011  . Wheezing without diagnosis of asthma 02/02/2011  . Syncope 12/27/2010  . Left lumbar radiculopathy 05/30/2010  . Wellness examination 05/30/2010  . 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 DOE (dyspnea on exertion) 06/10/2008  . DIASTOLIC HEART FAILURE, CHRONIC 06/09/2008  . KNEE PAIN, BILATERAL 04/21/2008  . LEG PAIN, BILATERAL 04/21/2008  . 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 PACEMAKER, PERMANENT 03/04/2008  . INSOMNIA-SLEEP DISORDER-UNSPEC 10/23/2007  . Lumbar radiculopathy, chronic 06/10/2007  . GOUT 04/22/2007  . 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 ERECTILE DYSFUNCTION 11/26/2006  . ALLERGIC RHINITIS 11/26/2006  . Type 2 diabetes, uncontrolled, with neuropathy ([place]) 08/29/2006  . Hyperlipidemia 08/29/2006  . Overweight(278.02) 08/29/2006  . Depression 08/29/2006  . Essential hypertension 08/29/2006  . CORONARY ARTERY DISEASE 08/29/2006  . BENIGN PROSTATIC HYPERTROPHY 08/29/2006    Past Surgical History:  Procedure Laterality Date  . COLONOSCOPY    . CORONARY ANGIOPLASTY WITH STENT PLACEMENT  12/27/10   "I've had a total of 9  cardiac stents put in"  . CORONARY ARTERY BYPASS GRAFT  1992   CABG X 2  . ESOPHAGOGASTRODUODENOSCOPY N/A 12/01/2013   Procedure: ESOPHAGOGASTRODUODENOSCOPY (EGD);  Surgeon: Lafayette Dragon, MD;  Location: [facility];  Service: Endoscopy;  Laterality: N/A;  . INSERT / REPLACE / REMOVE PACEMAKER  ~ 2004   initial pacemaker placement  . INSERT / REPLACE / REMOVE PACEMAKER  10/2009   generator change  . LUMBAR LAMINECTOMY/DECOMPRESSION MICRODISCECTOMY Right 03/23/2014   Procedure: LAMINECTOMY AND FORAMINOTOMY RIGHT LUMBAR THREE-FOUR,LUMBAR FOUR-FIVE, LUMBAR FIVE-SACRAL ONE;  Surgeon: Charlie Pitter, MD;  Location: [facility];  Service: Neurosurgery;  Laterality: Right;  right  . LUMBAR LAMINECTOMY/DECOMPRESSION MICRODISCECTOMY Left 12/01/2014   Procedure: Left Lumbar Three-Four, Lumbar Four-Five Laminectomy and Foraminotomy;  Surgeon: Earnie Larsson, MD;  Location: [facility]S;  Service: Neurosurgery;  Laterality: Left;  . s/p left arm surgury after work accident  1991   "2000# steel fell on it"  . s/p right hand surgury for foreign object  1970's   "piece of wood went in my hand; had to get that out"       Family History  Problem Relation Age of Onset  . Diabetes Mother   . Diabetes  Sister   . Heart disease Sister        2 sister died with heart disease  . Coronary artery disease Other 39       male, first degree relative  . Diabetes Other        1st degree relative  . Diabetes Sister   . Lung cancer Sister        deceased  . Diabetes Sister     Social History   Tobacco Use  . Smoking status: Former Smoker    Packs/day: 3.00    Years: 9.00    Pack years: 27.00    Types: Cigarettes    Quit date: 02/26/1973    Years since quitting: 46.0  . Smokeless tobacco: Former Systems developer  . Tobacco comment: quit smoking 11yr ago  Substance Use Topics  . Alcohol use: No    Alcohol/week: 0.0 standard drinks  . Drug use: No    Comment: "used pouches of tobacco for a long time; quit those  12/09/1970"    Home Medications Prior to Admission medications   Medication Sig Start Date End Date Taking? Authorizing Provider  albuterol (PROAIR HFA) 108 (90 Base) MCG/ACT inhaler Inhale 2 puffs into the lungs every 6 (six) hours as needed for wheezing or shortness of breath. 02/05/18   JBiagio Borg MD  allopurinol (ZYLOPRIM) 300 MG tablet TAKE 1 TABLET BY MOUTH  DAILY 01/20/19   JBiagio Borg MD  atorvastatin (LIPITOR) 80 MG tablet TAKE 1 TABLET BY MOUTH  DAILY 01/23/19   JBiagio Borg MD  benzonatate (TESSALON PERLES) 100 MG capsule 1-2 tab by mouth every 6 hrs as needed for cough 01/31/18   JBiagio Borg MD  Blood Glucose Monitoring Suppl (ONE TOUCH ULTRA 2) w/Device KIT Use as directed 10/12/15   JBiagio Borg MD  carvedilol (COREG) 25 MG tablet TAKE 1 TABLET BY MOUTH TWO  TIMES DAILY 01/20/19   JBiagio Borg MD  Cholecalciferol (VITAMIN D) 1000 UNITS capsule Take 1,000 Units by mouth 2 (two) times daily. Reported on 02/15/2015 07/31/13   [provider]  dextromethorphan-guaiFENesin (MUCINEX DM) 30-600 MG 12hr tablet Take 1 tablet by mouth 2 (two) times daily. 01/17/18   MKathie Dike MD  donepezil (ARICEPT) 5 MG tablet TAKE 1 TABLET BY MOUTH  DAILY 01/23/19   JBiagio Borg MD  fluticasone ([facility] 50 MCG/ACT nasal spray Place 2 sprays into both nostrils daily. 07/19/18   JBiagio Borg MD  furosemide (LASIX) 40 MG tablet Take 2 tablets (80 mg total) by mouth 2 (two) times daily. 01/31/18   JBiagio Borg MD  gabapentin (NEURONTIN) 300 MG capsule TAKE 2 CAPSULES BY MOUTH 3  TIMES DAILY 01/20/19   JBiagio Borg MD  glipiZIDE (GLUCOTROL XL) 10 MG 24 hr tablet TAKE 1 TABLET BY MOUTH  DAILY WITH BREAKFAST 01/20/19   JBiagio Borg MD  glucose blood (ONE TOUCH ULTRA TEST) test strip CHECK BLOOD SUGAR TWO TIMES DAILY 05/24/17   JBiagio Borg MD  HYDROcodone-acetaminophen ([facility] 10-325 MG tablet Take 1 tablet by mouth daily as needed. 01/01/19   JBiagio Borg MD  iron polysaccharides  (NU-IRON) 150 MG capsule Take 1 capsule (150 mg total) by mouth daily. 01/02/19   JBiagio Borg MD  lisinopril (ZESTRIL) 2.5 MG tablet Take 1 tablet (2.5 mg total) by mouth daily. 02/07/19   MBarton Dubois MD  metFORMIN (GLUCOPHAGE) 500 MG tablet TAKE 4 TABLETS BY MOUTH  DAILY 01/20/19  Biagio Borg, MD  Multiple Vitamin (MULTIVITAMIN WITH MINERALS) TABS tablet Take 1 tablet by mouth every morning.    [provider]  [facility] DELICA LANCETS 18E MISC USE AS DIRECTED THREE TIMES DAILY TO CHECK BLOOD SUGAR 05/24/17   Biagio Borg, MD  oxybutynin (DITROPAN-XL) 10 MG 24 hr tablet TAKE 1 TABLET BY MOUTH AT  BEDTIME 01/23/19   Biagio Borg, MD  pantoprazole (PROTONIX) 40 MG tablet TAKE 1 TABLET (40 MG TOTAL) BY MOUTH DAILY. TAKE 30 MINUTES BEFORE A MEAL. 01/09/19   Doran Stabler, MD  potassium chloride SA (KLOR-CON) 20 MEQ tablet TAKE 1 TABLET BY MOUTH  EVERY EVENING 01/20/19   Biagio Borg, MD  predniSONE (DELTASONE) 20 MG tablet Take 2 tablets by mouth daily x2 days; then 1 tablet by mouth daily x3 days; then half tablet by mouth daily x3 days and stop prednisone. 02/05/19   Barton Dubois, MD  sitaGLIPtin (JANUVIA) 100 MG tablet Take 1 tablet (100 mg total) by mouth daily. 01/31/18   Biagio Borg, MD  tamsulosin (FLOMAX) 0.4 MG CAPS capsule TAKE 1 CAPSULE BY MOUTH  TWICE DAILY 01/20/19   Biagio Borg, MD  tiZANidine (ZANAFLEX) 4 MG tablet TAKE 1 TABLET BY MOUTH   EVERY 6 HOURS AS NEEDED FOR MUSCLE SPASMS. 09/12/18   Biagio Borg, MD  warfarin (COUMADIN) 5 MG tablet TAKE 1/2 TO 1 TABLET BY MOUTH DAILY AS DIRECTED BY COUMADIN CLINIC Patient taking differently: 5 mg daily.  12/16/18   Evans Lance, MD    Allergies    Crestor [rosuvastatin calcium] and Latex  Review of Systems   Review of Systems  Constitutional: Positive for chills and fever.  Respiratory: Negative for cough and shortness of breath.   Cardiovascular: Negative for chest pain.  Gastrointestinal: Negative for  abdominal pain, nausea and vomiting.  Genitourinary: Negative for dysuria.  Neurological: Negative for dizziness and syncope.  All other systems reviewed and are negative.   Physical Exam Updated Vital Signs BP (!) 152/66   Pulse 61   Temp (!) 102.8 F (39.3 C) (Oral)   Resp 16   Ht 1.803 m ('5\' 11"' )   Wt 99.3 kg   SpO2 91%   BMI 30.53 kg/m   Physical Exam Vitals and nursing note reviewed.  Constitutional:      Appearance: He is well-developed. He is not ill-appearing.  HENT:     Head: Normocephalic and atraumatic.     Nose: Nose normal.     Mouth/Throat:     Mouth: Mucous membranes are moist.  Eyes:     Pupils: Pupils are equal, round, and reactive to light.  Neck:     Comments: No midline C-spine tenderness to palpation, step-off or deformity Cardiovascular:     Rate and Rhythm: Normal rate and regular rhythm.     Heart sounds: Normal heart sounds. No murmur.  Pulmonary:     Effort: Pulmonary effort is normal. No respiratory distress.     Breath sounds: Normal breath sounds. No wheezing.  Abdominal:     General: Bowel sounds are normal.     Palpations: Abdomen is soft.     Tenderness: There is no abdominal tenderness. There is no rebound.  Musculoskeletal:        General: No deformity.     Cervical back: Normal range of motion and neck supple.     Comments: Normal range of motion bilateral hips and knees  Lymphadenopathy:  Cervical: No cervical adenopathy.  Skin:    General: Skin is warm and dry.  Neurological:     Mental Status: He is alert and oriented to person, place, and time.  Psychiatric:        Mood and Affect: Mood normal.     ED Results / Procedures / Treatments   Labs (all labs ordered are listed, but only abnormal results are displayed) Labs Reviewed  CBC WITH DIFFERENTIAL/PLATELET - Abnormal; Notable for the following components:      Result Value   WBC 18.2 (*)    RBC 4.19 (*)    Hemoglobin 10.1 (*)    HCT 33.2 (*)    MCV 79.2 (*)      MCH 24.1 (*)    RDW 18.6 (*)    Neutro Abs 16.2 (*)    Abs Immature Granulocytes 0.14 (*)    All other components within normal limits  BASIC METABOLIC PANEL - Abnormal; Notable for the following components:   Sodium 132 (*)    Chloride 92 (*)    Glucose, Bld 171 (*)    BUN 26 (*)    Calcium 8.1 (*)    GFR calc non Af Amer 58 (*)    All other components within normal limits  PROTIME-INR - Abnormal; Notable for the following components:   Prothrombin Time 57.2 (*)    INR 6.5 (*)    All other components within normal limits  CULTURE, BLOOD (ROUTINE X 2)  CULTURE, BLOOD (ROUTINE X 2)  LACTIC ACID, PLASMA  LACTIC ACID, PLASMA  URINALYSIS, ROUTINE W REFLEX MICROSCOPIC    EKG None  Radiology DG Chest Portable 1 View  Result Date: 02/15/2019 CLINICAL DATA:  Fever. COVID positive 02/01/2019 EXAM: PORTABLE CHEST 1 VIEW COMPARISON:  Radiograph 02/01/2019. FINDINGS: Post median sternotomy. Left-sided pacemaker in place. Cardiomegaly mediastinal contours are unchanged. Low lung volumes persist. Patchy bilateral airspace disease, slightly worsened in the interim. No pulmonary edema, large pleural effusion or pneumothorax. No acute osseous abnormalities are seen. IMPRESSION: 1. Low lung volumes with slight worsening patchy bilateral airspace disease over the past 2 weeks, likely progressive pneumonia. 2. Stable cardiomegaly with pacemaker in place. Electronically Signed   By: Keith Rake M.D.   On: 02/15/2019 23:57    Procedures Procedures (including critical care time)  CRITICAL CARE Performed by: Merryl Hacker   Total critical care time: 35 minutes  Critical care time was exclusive of separately billable procedures and treating other patients.  Critical care was necessary to treat or prevent imminent or life-threatening deterioration.  Critical care was time spent personally by me on the following activities: development of treatment plan with patient and/or surrogate as  well as nursing, discussions with consultants, evaluation of patient's response to treatment, examination of patient, obtaining history from patient or surrogate, ordering and performing treatments and interventions, ordering and review of laboratory studies, ordering and review of radiographic studies, pulse oximetry and re-evaluation of patient's condition.   Medications Ordered in ED Medications  vancomycin (VANCOCIN) IVPB 1000 mg/200 mL premix (has no administration in time range)  piperacillin-tazobactam (ZOSYN) IVPB 3.375 g (has no administration in time range)  phytonadione (VITAMIN K) tablet 2.5 mg (2.5 mg Oral Given 02/16/19 0249)    ED Course  I have reviewed the triage vital signs and the nursing notes.  Pertinent labs & imaging results that were available during my care of the patient were reviewed by me and considered in my medical decision making (see chart for  details).    MDM Rules/Calculators/A&P                       Patient presents after reported fall out of bed.  He is overall nontoxic-appearing.  Vital signs notable for temperature of 102.8.  He also has O2 sats 90 to 91% on room air.  He is not particularly tachypneic and is otherwise asymptomatic.  No evidence of trauma on exam.  Recent admission for COVID-19.  Will obtain lab work, chest x-ray, urine given fever and hypoxia.  Work-up notable for leukocytosis to 18.  Lactate is normal.  No signs or symptoms of severe sepsis.  Chest x-ray shows evolving worsening pneumonia.  Suspect he may have a bacterial component given his leukocytosis versus progression of COVID-19.  Blood cultures were added and patient was covered for HCAP with vancomycin and Zosyn.  Urinalysis is pending.  Of note PT/INR noted to be 6.1.  No evidence of acute bleeding.  Patient was given vitamin K.  Patient on 2 L of oxygen given borderline O2 sats.  We will plan for admission.  Final Clinical Impression(s) / ED Diagnoses Final diagnoses:  HCAP  (healthcare-associated pneumonia)  COVID-19  Hypoxia  Elevated INR    Rx / DC Orders ED Discharge Orders    None       Olinda Nola, Barbette Hair, MD 02/16/19 [phone]

## 2019-02-16 NOTE — Progress Notes (Signed)
   Patient seen and evaluated, chart reviewed, please see EMR for updated orders. Please see full H&P dictated by admitting physician for Dr. Olevia Bowens same date of service.   Brief summary 74 y.o. male with medical history significant for atrial fibrillation/flutter status post permanent pacemaker placement on anticoagulation with Coumadin, BPH, CAD with prior CABG, hypertension, dyslipidemia, diabetes, chronic gout, peripheral neuropathy admitted on 02/16/2019 with acute hypoxic respiratory failure secondary to Covid pneumonia  A/p 1)Acute hypoxic respiratory failure secondary to Covid pneumonia-- Patient had first course of  remdesivir from 02/01/19 to 02/05/19 Patient received remdesivir 200 mg IV x 1 on 02/16/2019, - -will not order further remdesivir at this time -Continue Decadron -Patient received Actemra x1 dose, will not order further Actemra at this time -Continue mucolytics, bronchodilators and supplemental oxygen currently requiring 1 to 2 L of oxygen via nasal cannula  2) chronic anticoagulation--- supratherapeutic INR with left groin hematoma/bruising -6.5, patient received vitamin K 2.5 mg x 1, repeat INR 5.4, continue to hold Coumadin -Follow CBC INR  3) left hip pain/left groin bruising/hematoma--- in the setting of supratherapeutic INR, no definite trauma, left hip x-rays without acute findings  4)Chronic atrial fibrillation/status post PPM--- INR management as above in #2, continue Coreg for rate control  5)DM2-hold glipizide, Metformin and Januvia,  -use Novolog/Humalog Sliding scale insulin with Accu-Cheks/Fingersticks as ordered   6)CAD/s/p Prior CABG-stable, chest pain-free, continue Coreg and atorvastatin  7) leukocytosis--- suspect steroid-induced leukocytosis with white count of 18 K   8)HTN--hold Lasix and lisinopril, Coreg as ordered,   may use IV labetalol when necessary  Every 4 hours for systolic blood pressure over 160 mmhg  9)Lt Hip pain /left groin  hematoma/bruising-----get physical therapy eval  Roxan Hockey, MD

## 2019-02-16 NOTE — ED Notes (Signed)
Pt was given his breakfast tray.

## 2019-02-16 NOTE — H&P (Signed)
History and Physical    [name] LKT:[id number] DOB: 1945/06/13 DOA: 02/15/2019  PCP: Biagio Borg, MD   Patient coming from: Diarrhea.  I have personally briefly reviewed patient's old medical records in [place]  Chief Complaint: Shortness of breath.  HPI: [name] is a 74 y.o. male with medical history significant of aortic root dilation, atrioventricular block, pacemaker placement history, chronic diastolic CHF, CAD, hypertension, osteoarthritis, chronic back pain, depression, type 2 diabetes, gout, remote history of migraine headaches, hyperlipidemia, sleep apnea who was recently admitted and discharged from 102 2021 until 106 2021 due to acute respiratory failure with hypoxia due to COVID-19 pneumonia and is returning to the emergency department due to diarrhea.  He denies abdominal pain, melena or hematochezia.  Headache, sore throat or rhinorrhea.  No chest pain, palpitations, dizziness, diaphoresis, nausea or vomiting.  No dysuria, frequency or hematuria.  No polyuria, polydipsia, polyphagia or blurred vision.  ED Course: Initial vital signs temperature 102.8 F, pulse 65, respiration 18, blood pressure 137/68 mmHg and O2 sat 94% on room air.  The patient was given 2.5 mg of vitamin K p.o., IV Zosyn and vancomycin.  His white count was 18.2, hemoglobin 7.1 g/dL and platelets 151.  PT was 57.2 seconds and INR 6.5.  Lactic acid was 1.4.  Sodium 132, potassium 3.6, chloride 92 and CO2 31 mmol/L.  Calcium 8.1, glucose 171, BUN 26 and creatinine 1.22 mg/dL.  His chest radiograph shows worsening airspace disease.  Review of Systems: As per HPI otherwise 10 point review of systems negative.   Past Medical History:  Diagnosis Date  . Aortic root dilatation ([place]) 02/02/2011  . Arthritis    "lower back; going back down both my sciatic nerves"  . Atrioventricular block, complete ([place]) 09/04/2008  . BENIGN PROSTATIC HYPERTROPHY 08/29/2006   takes Flomax daily  . CAD,  AUTOLOGOUS BYPASS GRAFT 03/04/2008  . Cataracts, bilateral    immature  . CHF (congestive heart failure) (HCC)    takes Lasix daily  . Chronic back pain    HNP   . CORONARY ARTERY DISEASE 08/29/2006   takes Coumadin daily  . DEPRESSION 08/29/2006  . DIABETES MELLITUS, TYPE II 08/29/2006   takes Metformin,Januvia,and Glipizide  daily  . DIASTOLIC HEART FAILURE, CHRONIC 06/09/2008  . GOUT 04/22/2007   takes Allopurinol daily  . History of migraine    55yr ago  . HYPERLIPIDEMIA 08/29/2006   takes Atorvastatin daily  . HYPERTENSION 08/29/2006   takes Lisinopril daily  . INSOMNIA-SLEEP DISORDER-UNSPEC 10/23/2007  . Left lumbar radiculopathy 05/30/2010  . LUMBAR RADICULOPATHY, RIGHT 06/10/2007  . Muscle spasm    takes Zanaflex daily  . [place]   "I've had several MIs"  . 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 PACEMAKER, PERMANENT 03/04/2008   pt denies this date  . Peripheral neuropathy    takes Gabapentin daily  . Presence of permanent cardiac pacemaker   . Shortness of breath dyspnea    "all my life" with exertion  . Sleep apnea    "if I lay flat I quit breathing; HOB up I'm fine"    Past Surgical History:  Procedure Laterality Date  . COLONOSCOPY    . CORONARY ANGIOPLASTY WITH STENT PLACEMENT  12/27/10   "I've had a total of 9 cardiac stents put in"  . CORONARY ARTERY BYPASS GRAFT  1992   CABG X 2  . ESOPHAGOGASTRODUODENOSCOPY N/A 12/01/2013   Procedure: ESOPHAGOGASTRODUODENOSCOPY (EGD);  Surgeon: DLafayette Dragon  MD;  Location: [facility];  Service: Endoscopy;  Laterality: N/A;  . INSERT / REPLACE / REMOVE PACEMAKER  ~ 2004   initial pacemaker placement  . INSERT / REPLACE / REMOVE PACEMAKER  10/2009   generator change  . LUMBAR LAMINECTOMY/DECOMPRESSION MICRODISCECTOMY Right 03/23/2014   Procedure: LAMINECTOMY AND FORAMINOTOMY RIGHT LUMBAR THREE-FOUR,LUMBAR FOUR-FIVE, LUMBAR FIVE-SACRAL ONE;  Surgeon: Charlie Pitter, MD;  Location: [place] [facility];   Service: Neurosurgery;  Laterality: Right;  right  . LUMBAR LAMINECTOMY/DECOMPRESSION MICRODISCECTOMY Left 12/01/2014   Procedure: Left Lumbar Three-Four, Lumbar Four-Five Laminectomy and Foraminotomy;  Surgeon: Earnie Larsson, MD;  Location: [place] NEUR[facility] ORS;  Service: Neurosurgery;  Laterality: Left;  . s/p left arm surgury after work accident  1991   "2000# steel fell on it"  . s/p right hand surgury for foreign object  1970's   "piece of wood went in my hand; had to get that out"     reports that he quit smoking about 46 years ago. His smoking use included cigarettes. He has a 27.00 pack-year smoking history. He has quit using smokeless tobacco. He reports that he does not drink alcohol or use drugs.  Allergies  Allergen Reactions  . Crestor [Rosuvastatin Calcium] Other (See Comments)    Urination of blood  . Latex Rash and Other (See Comments)    Gloves cause welts on hands!!    Family History  Problem Relation Age of Onset  . Diabetes Mother   . Diabetes Sister   . Heart disease Sister        2 sister died with heart disease  . Coronary artery disease Other 73       male, first degree relative  . Diabetes Other        1st degree relative  . Diabetes Sister   . Lung cancer Sister        deceased  . Diabetes Sister    Prior to Admission medications   Medication Sig Start Date End Date Taking? Authorizing Provider  albuterol (PROAIR HFA) 108 (90 Base) MCG/ACT inhaler Inhale 2 puffs into the lungs every 6 (six) hours as needed for wheezing or shortness of breath. 02/05/18   Biagio Borg, MD  allopurinol (ZYLOPRIM) 300 MG tablet TAKE 1 TABLET BY MOUTH  DAILY 01/20/19   Biagio Borg, MD  atorvastatin (LIPITOR) 80 MG tablet TAKE 1 TABLET BY MOUTH  DAILY 01/23/19   Biagio Borg, MD  benzonatate (TESSALON PERLES) 100 MG capsule 1-2 tab by mouth every 6 hrs as needed for cough 01/31/18   Biagio Borg, MD  Blood Glucose Monitoring Suppl (ONE TOUCH ULTRA 2) w/Device KIT Use as directed  10/12/15   Biagio Borg, MD  carvedilol (COREG) 25 MG tablet TAKE 1 TABLET BY MOUTH TWO  TIMES DAILY 01/20/19   Biagio Borg, MD  Cholecalciferol (VITAMIN D) 1000 UNITS capsule Take 1,000 Units by mouth 2 (two) times daily. Reported on 02/15/2015 07/31/13   [provider]  dextromethorphan-guaiFENesin (MUCINEX DM) 30-600 MG 12hr tablet Take 1 tablet by mouth 2 (two) times daily. 01/17/18   Kathie Dike, MD  donepezil (ARICEPT) 5 MG tablet TAKE 1 TABLET BY MOUTH  DAILY 01/23/19   Biagio Borg, MD  fluticasone [facility]) 50 MCG/ACT nasal spray Place 2 sprays into both nostrils daily. 07/19/18   Biagio Borg, MD  furosemide (LASIX) 40 MG tablet Take 2 tablets (80 mg total) by mouth 2 (two) times daily. 01/31/18   Cathlean Cower  W, MD  gabapentin (NEURONTIN) 300 MG capsule TAKE 2 CAPSULES BY MOUTH 3  TIMES DAILY 01/20/19   Biagio Borg, MD  glipiZIDE (GLUCOTROL XL) 10 MG 24 hr tablet TAKE 1 TABLET BY MOUTH  DAILY WITH BREAKFAST 01/20/19   Biagio Borg, MD  glucose blood (ONE TOUCH ULTRA TEST) test strip CHECK BLOOD SUGAR TWO TIMES DAILY 05/24/17   Biagio Borg, MD  HYDROcodone-acetaminophen [facility]) 10-325 MG tablet Take 1 tablet by mouth daily as needed. 01/01/19   Biagio Borg, MD  iron polysaccharides (NU-IRON) 150 MG capsule Take 1 capsule (150 mg total) by mouth daily. 01/02/19   Biagio Borg, MD  lisinopril (ZESTRIL) 2.5 MG tablet Take 1 tablet (2.5 mg total) by mouth daily. 02/07/19   Barton Dubois, MD  metFORMIN (GLUCOPHAGE) 500 MG tablet TAKE 4 TABLETS BY MOUTH  DAILY 01/20/19   Biagio Borg, MD  Multiple Vitamin (MULTIVITAMIN WITH MINERALS) TABS tablet Take 1 tablet by mouth every morning.    [provider]  [facility] DELICA LANCETS 29W MISC USE AS DIRECTED THREE TIMES DAILY TO CHECK BLOOD SUGAR 05/24/17   Biagio Borg, MD  oxybutynin (DITROPAN-XL) 10 MG 24 hr tablet TAKE 1 TABLET BY MOUTH AT  BEDTIME 01/23/19   Biagio Borg, MD  pantoprazole (PROTONIX) 40 MG tablet TAKE 1  TABLET (40 MG TOTAL) BY MOUTH DAILY. TAKE 30 MINUTES BEFORE A MEAL. 01/09/19   Doran Stabler, MD  potassium chloride SA (KLOR-CON) 20 MEQ tablet TAKE 1 TABLET BY MOUTH  EVERY EVENING 01/20/19   Biagio Borg, MD  predniSONE (DELTASONE) 20 MG tablet Take 2 tablets by mouth daily x2 days; then 1 tablet by mouth daily x3 days; then half tablet by mouth daily x3 days and stop prednisone. 02/05/19   Barton Dubois, MD  sitaGLIPtin (JANUVIA) 100 MG tablet Take 1 tablet (100 mg total) by mouth daily. 01/31/18   Biagio Borg, MD  tamsulosin (FLOMAX) 0.4 MG CAPS capsule TAKE 1 CAPSULE BY MOUTH  TWICE DAILY 01/20/19   Biagio Borg, MD  tiZANidine (ZANAFLEX) 4 MG tablet TAKE 1 TABLET BY MOUTH   EVERY 6 HOURS AS NEEDED FOR MUSCLE SPASMS. 09/12/18   Biagio Borg, MD  warfarin (COUMADIN) 5 MG tablet TAKE 1/2 TO 1 TABLET BY MOUTH DAILY AS DIRECTED BY COUMADIN CLINIC Patient taking differently: 5 mg daily.  12/16/18   Evans Lance, MD    Physical Exam: Vitals:   02/16/19 0130 02/16/19 0230 02/16/19 0245 02/16/19 0300  BP: (!) 152/66 132/68  136/66  Pulse: 61 60 (!) 59 62  Resp: 16   18  Temp:      TempSrc:      SpO2: 91% 92% 91% 100%  Weight:      Height:        Constitutional: NAD, calm, comfortable Eyes: PERRL, lids and conjunctivae normal ENMT: Mucous membranes are dry. Posterior pharynx clear of any exudate or lesions. Neck: normal, supple, no masses, no thyromegaly Respiratory: Bilateral crackles. Normal respiratory effort. No accessory muscle use.  Cardiovascular: Regular rate and rhythm, no murmurs / rubs / gallops. No extremity edema. 2+ pedal pulses. No carotid bruits.  Abdomen: Soft, no tenderness, no masses palpated. No hepatosplenomegaly. Bowel sounds positive.  Musculoskeletal: no clubbing / cyanosis.Good ROM, no contractures. Normal muscle tone.  Skin: no rashes, lesions, ulcers on very limited dermatological examination. Neurologic: CN 2-12 grossly intact. Sensation intact, DTR  normal. Strength 5/5 in all  4.  Psychiatric: Somnolent, but wakes up and answer simple questions.  Labs on Admission: I have personally reviewed following labs and imaging studies  CBC: Recent Labs  Lab 02/16/19 0052  WBC 18.2*  NEUTROABS 16.2*  HGB 10.1*  HCT 33.2*  MCV 79.2*  PLT 353   Basic Metabolic Panel: Recent Labs  Lab 02/16/19 0052  NA 132*  K 3.6  CL 92*  CO2 31  GLUCOSE 171*  BUN 26*  CREATININE 1.22  CALCIUM 8.1*   GFR: Estimated Creatinine Clearance: 64.8 mL/min (by C-G formula based on SCr of 1.22 mg/dL). Liver Function Tests: No results for input(s): AST, ALT, ALKPHOS, BILITOT, PROT, ALBUMIN in the last 168 hours. No results for input(s): LIPASE, AMYLASE in the last 168 hours. No results for input(s): AMMONIA in the last 168 hours. Coagulation Profile: Recent Labs  Lab 02/16/19 0051  INR 6.5*   Cardiac Enzymes: No results for input(s): CKTOTAL, CKMB, CKMBINDEX, TROPONINI in the last 168 hours. BNP (last 3 results) No results for input(s): PROBNP in the last 8760 hours. HbA1C: No results for input(s): HGBA1C in the last 72 hours. CBG: No results for input(s): GLUCAP in the last 168 hours. Lipid Profile: No results for input(s): CHOL, HDL, LDLCALC, TRIG, CHOLHDL, LDLDIRECT in the last 72 hours. Thyroid Function Tests: No results for input(s): TSH, T4TOTAL, FREET4, T3FREE, THYROIDAB in the last 72 hours. Anemia Panel: No results for input(s): VITAMINB12, FOLATE, FERRITIN, TIBC, IRON, RETICCTPCT in the last 72 hours. Urine analysis:    Component Value Date/Time   COLORURINE YELLOW 01/16/2018 1400   APPEARANCEUR CLEAR 01/16/2018 1400   LABSPEC 1.014 01/16/2018 1400   PHURINE 5.0 01/16/2018 1400   GLUCOSEU NEGATIVE 01/16/2018 1400   GLUCOSEU NEGATIVE 09/24/2017 1112   HGBUR NEGATIVE 01/16/2018 1400   BILIRUBINUR neg 01/01/2019 1436   KETONESUR NEGATIVE 01/16/2018 1400   PROTEINUR Negative 01/01/2019 1436   PROTEINUR NEGATIVE 01/16/2018 1400    UROBILINOGEN 0.2 01/01/2019 1436   UROBILINOGEN 0.2 09/24/2017 1112   NITRITE neg 01/01/2019 1436   NITRITE NEGATIVE 01/16/2018 1400   LEUKOCYTESUR Negative 01/01/2019 1436    Radiological Exams on Admission: DG Chest Portable 1 View  Result Date: 02/15/2019 CLINICAL DATA:  Fever. COVID positive 02/01/2019 EXAM: PORTABLE CHEST 1 VIEW COMPARISON:  Radiograph 02/01/2019. FINDINGS: Post median sternotomy. Left-sided pacemaker in place. Cardiomegaly mediastinal contours are unchanged. Low lung volumes persist. Patchy bilateral airspace disease, slightly worsened in the interim. No pulmonary edema, large pleural effusion or pneumothorax. No acute osseous abnormalities are seen. IMPRESSION: 1. Low lung volumes with slight worsening patchy bilateral airspace disease over the past 2 weeks, likely progressive pneumonia. 2. Stable cardiomegaly with pacemaker in place. Electronically Signed   By: Keith Rake M.D.   On: 02/15/2019 23:57    EKG: Independently reviewed.   Assessment/Plan Principal Problem:   Pneumonia due to COVID-19 virus Admit to progressive unit/inpatient. Continue supplemental oxygen. Continue with scheduled and as needed bronchodilators. Dexamethasone 6 mg IVP daily. Remdesivir per pharmacy. The patient consented to receive Tocilizumab IVPB x1. Analgesics and antitussives as needed. Follow CBC, CMP and inflammatory markers.  Active Problems: Will need med reconciliation first.   Type 2 diabetes, uncontrolled, with neuropathy (HCC) Carbohydrate modified diet. COVID-19 glycemic control. Continue glipizide. Gabapentin for neuropathy.    Hyperlipidemia Taking atorvastatin.    GOUT Continue allopurinol 300 mg p.o. daily.    Essential hypertension Continue carvedilol and lisinopril once med reconsultation performed. Monitor blood pressure, heart rate, renal function electrolytes.  CAD, AUTOLOGOUS BYPASS GRAFT Continue beta-blocker and statin. On  warfarin.    Lumbar radiculopathy, chronic Continue hydrocodone and gabapentin.    Atrial fibrillation (HCC) CHA?DS?-VASc Score of at least 4 On carvedilol and warfarin. INR was supratherapeutic. Warfarin per pharmacy.    Spinal stenosis, lumbar region, with neurogenic claudication Continue Norco as needed    Microcytic anemia Monitor H&H.    Gastroesophageal reflux disease Continue PPI.   DVT prophylaxis: On warfarin. Code Status: DNR. Family Communication:  Disposition Plan: Admit to telemetry for HCAP treatment. Consults called: Admission status: Inpatient/telemetry.   Reubin Milan MD [facility]  If 7PM-7AM, please contact night-coverage [email or web address] Password [facility]  02/16/2019, 3:20 AM   This document was prepared using Dragon voice recognition software and may contain some unintended transcription errors.

## 2019-02-16 NOTE — ED Notes (Signed)
CRITICAL VALUE ALERT  Critical Value:  INR 6.5 Date & Time Notied: 02/16/19 @ [id number] Provider Notified: Dr Dina Rich Orders Received/Actions taken:None yet

## 2019-02-17 LAB — CBC WITH DIFFERENTIAL/PLATELET
Abs Immature Granulocytes: 0.17 10*3/uL — ABNORMAL HIGH (ref 0.00–0.07)
Basophils Absolute: 0 10*3/uL (ref 0.0–0.1)
Basophils Relative: 0 %
Eosinophils Absolute: 0 10*3/uL (ref 0.0–0.5)
Eosinophils Relative: 0 %
HCT: 39 % (ref 39.0–52.0)
Hemoglobin: 11.6 g/dL — ABNORMAL LOW (ref 13.0–17.0)
Immature Granulocytes: 1 %
Lymphocytes Relative: 4 %
Lymphs Abs: 0.6 10*3/uL — ABNORMAL LOW (ref 0.7–4.0)
MCH: 23.9 pg — ABNORMAL LOW (ref 26.0–34.0)
MCHC: 29.7 g/dL — ABNORMAL LOW (ref 30.0–36.0)
MCV: 80.2 fL (ref 80.0–100.0)
Monocytes Absolute: 0.5 10*3/uL (ref 0.1–1.0)
Monocytes Relative: 4 %
Neutro Abs: 13.5 10*3/uL — ABNORMAL HIGH (ref 1.7–7.7)
Neutrophils Relative %: 91 %
Platelets: 142 10*3/uL — ABNORMAL LOW (ref 150–400)
RBC: 4.86 MIL/uL (ref 4.22–5.81)
RDW: 18.8 % — ABNORMAL HIGH (ref 11.5–15.5)
WBC: 14.7 10*3/uL — ABNORMAL HIGH (ref 4.0–10.5)
nRBC: 0 % (ref 0.0–0.2)

## 2019-02-17 LAB — COMPREHENSIVE METABOLIC PANEL
ALT: 25 U/L (ref 0–44)
AST: 25 U/L (ref 15–41)
Albumin: 2.9 g/dL — ABNORMAL LOW (ref 3.5–5.0)
Alkaline Phosphatase: 62 U/L (ref 38–126)
Anion gap: 15 (ref 5–15)
BUN: 39 mg/dL — ABNORMAL HIGH (ref 8–23)
CO2: 27 mmol/L (ref 22–32)
Calcium: 8.6 mg/dL — ABNORMAL LOW (ref 8.9–10.3)
Chloride: 95 mmol/L — ABNORMAL LOW (ref 98–111)
Creatinine, Ser: 1.08 mg/dL (ref 0.61–1.24)
GFR calc Af Amer: >60 mL/min (ref >60)
GFR calc non Af Amer: >60 mL/min (ref >60)
Glucose, Bld: 230 mg/dL — ABNORMAL HIGH (ref 70–99)
Potassium: 4.6 mmol/L (ref 3.5–5.1)
Sodium: 137 mmol/L (ref 135–145)
Total Bilirubin: 1.2 mg/dL (ref 0.3–1.2)
Total Protein: 6.5 g/dL (ref 6.5–8.1)

## 2019-02-17 LAB — PROTIME-INR
INR: 4.9 (ref 0.8–1.2)
Prothrombin Time: 46.1 seconds — ABNORMAL HIGH (ref 11.4–15.2)

## 2019-02-17 LAB — PHOSPHORUS: Phosphorus: 3.9 mg/dL (ref 2.5–4.6)

## 2019-02-17 LAB — MAGNESIUM: Magnesium: 1.6 mg/dL — ABNORMAL LOW (ref 1.7–2.4)

## 2019-02-17 LAB — D-DIMER, QUANTITATIVE: D-Dimer, Quant: 2.12 ug/mL-FEU — ABNORMAL HIGH (ref 0.00–0.50)

## 2019-02-17 LAB — FERRITIN: Ferritin: 196 ng/mL (ref 24–336)

## 2019-02-17 LAB — C-REACTIVE PROTEIN: CRP: 29.9 mg/dL — ABNORMAL HIGH (ref <1.0)

## 2019-02-17 LAB — GLUCOSE, CAPILLARY: Glucose-Capillary: 207 mg/dL — ABNORMAL HIGH (ref 70–99)

## 2019-02-17 MED ORDER — MAGNESIUM SULFATE 2 GM/50ML IV SOLN
2.0000 g | Freq: Once | INTRAVENOUS | Status: AC
  Administered 2019-02-17: 2 g via INTRAVENOUS
  Filled 2019-02-17: qty 50

## 2019-02-17 MED ORDER — TRAZODONE HCL 50 MG PO TABS
100.0000 mg | ORAL_TABLET | Freq: Every day | ORAL | Status: DC
  Administered 2019-02-17 – 2019-02-18 (×2): 100 mg via ORAL
  Filled 2019-02-17 (×2): qty 2

## 2019-02-17 MED ORDER — ALPRAZOLAM 0.5 MG PO TABS
0.5000 mg | ORAL_TABLET | Freq: Once | ORAL | Status: AC
  Administered 2019-02-17: 0.5 mg via ORAL
  Filled 2019-02-17: qty 1

## 2019-02-17 NOTE — Plan of Care (Signed)
  Problem: Acute Rehab PT Goals(only PT should resolve) Goal: Pt Will Go Supine/Side To Sit Outcome: Progressing Flowsheets (Taken 02/17/2019 1357) Pt will go Supine/Side to Sit: with supervision Goal: Patient Will Transfer Sit To/From Stand Outcome: Progressing Flowsheets (Taken 02/17/2019 1357) Patient will transfer sit to/from stand: with min guard assist Goal: Pt Will Transfer Bed To Chair/Chair To Bed Outcome: Progressing Flowsheets (Taken 02/17/2019 1357) Pt will Transfer Bed to Chair/Chair to Bed: min guard assist Goal: Pt Will Ambulate Outcome: Progressing Flowsheets (Taken 02/17/2019 1357) Pt will Ambulate:  50 feet  with min guard assist  with rolling walker   1:57 PM, 02/17/19 Lonell Grandchild, MPT Physical Therapist with [facility] [phone] office [phone] mobile phone

## 2019-02-17 NOTE — Evaluation (Signed)
Physical Therapy Evaluation Patient Details Name: [name] MRN: [id number] DOB: 06-Jun-1945 Today's Date: 02/17/2019   History of Present Illness  [name] is a 74 y.o. male with medical history significant of aortic root dilation, atrioventricular block, pacemaker placement history, chronic diastolic CHF, CAD, hypertension, osteoarthritis, chronic back pain, depression, type 2 diabetes, gout, remote history of migraine headaches, hyperlipidemia, sleep apnea who was recently admitted and discharged from 102 2021 until 106 2021 due to acute respiratory failure with hypoxia due to COVID-19 pneumonia and is returning to the emergency department due to diarrhea.  He denies abdominal pain, melena or hematochezia.  Headache, sore throat or rhinorrhea.  No chest pain, palpitations, dizziness, diaphoresis, nausea or vomiting.  No dysuria, frequency or hematuria.  No polyuria, polydipsia, polyphagia or blurred vision.    Clinical Impression  Patient demonstrates slow labored movement for sitting up at bedside due to left groin discomfort, able to transfer to commode in bathroom with use of RW, limited for taking steps in room due to c/o fatigue and tolerated sitting up in chair after therapy.  Patient on room air throughout visit with SpO2 maintaining at 94-96% - RN notified.  Patient will benefit from continued physical therapy in hospital and recommended venue below to increase strength, balance, endurance for safe ADLs and gait.    Follow Up Recommendations SNF;Supervision for mobility/OOB;Supervision - Intermittent    Equipment Recommendations  None recommended by PT    Recommendations for Other Services       Precautions / Restrictions Precautions Precautions: Fall Restrictions Weight Bearing Restrictions: No      Mobility  Bed Mobility Overal bed mobility: Needs Assistance Bed Mobility: Supine to Sit     Supine to sit: Min guard;Min assist     General bed mobility  comments: head of bed raised, increased time, labored movement  Transfers Overall transfer level: Needs assistance Equipment used: Rolling walker (2 wheeled) Transfers: Sit to/from [facility] Sit to Stand: Min assist;Min guard Stand pivot transfers: Min assist;Min guard       General transfer comment: slow labored movement, unable to maintain standing balance without use of RW  Ambulation/Gait Ambulation/Gait assistance: Min guard;Min assist Gait Distance (Feet): 20 Feet Assistive device: Rolling walker (2 wheeled) Gait Pattern/deviations: Decreased step length - right;Decreased step length - left;Decreased stride length Gait velocity: decreased   General Gait Details: demonstrates slow labored cadence without loss of balance, limited mostly due to c/o fatigue  Stairs            Wheelchair Mobility    Modified Rankin (Stroke Patients Only)       Balance Overall balance assessment: Needs assistance Sitting-balance support: Feet supported;No upper extremity supported Sitting balance-Leahy Scale: Good Sitting balance - Comments: seated at EOB   Standing balance support: During functional activity;Bilateral upper extremity supported Standing balance-Leahy Scale: Fair Standing balance comment: using RW                             Pertinent Vitals/Pain Pain Assessment: Faces Faces Pain Scale: Hurts little more Pain Location: left groin area Pain Descriptors / Indicators: Sore;Grimacing;Guarding Pain Intervention(s): Limited activity within patient's tolerance;Monitored during session;Repositioned    Home Living Family/patient expects to be discharged to:: Private residence Living Arrangements: Spouse/significant other Available Help at Discharge: Family;Personal care attendant;Available PRN/intermittently Type of Home: House Home Access: Ramped entrance     Home Layout: One level Home Equipment: Cane - single point;Walker -  2  wheels;Bedside commode;Shower seat;Wheelchair - manual      Prior Function           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[employer], drives     Hand Dominance   Dominant Hand: Right    Extremity/Trunk Assessment   Upper Extremity Assessment Upper Extremity Assessment: Generalized weakness    Lower Extremity Assessment Lower Extremity Assessment: Generalized weakness    Cervical / Trunk Assessment Cervical / Trunk Assessment: Kyphotic  Communication      Cognition Arousal/Alertness: Awake/alert Behavior During Therapy: WFL for tasks assessed/performed Overall Cognitive Status: Within Functional Limits for tasks assessed                                        General Comments      Exercises     Assessment/Plan    PT Assessment Patient needs continued PT services  PT Problem List Decreased strength;Decreased activity tolerance;Decreased balance;Decreased mobility       PT Treatment Interventions Balance training;Gait training;Stair training;Functional mobility training;Therapeutic activities;Therapeutic exercise;Patient/family education    PT Goals (Current goals can be found in the Care Plan section)  Acute Rehab PT Goals Patient Stated Goal: return home with assistance PT Goal Formulation: With patient Time For Goal Achievement: 03/03/19 Potential to Achieve Goals: Good    Frequency Min 3X/week   Barriers to discharge        Co-evaluation               AM-PAC PT "6 Clicks" Mobility  Outcome Measure Help needed turning from your back to your side while in a flat bed without using bedrails?: A Little Help needed moving from lying on your back to sitting on the side of a flat bed without using bedrails?: A Little Help needed moving to and from a bed to a chair (including a wheelchair)?: A Little Help needed standing up from a chair using your arms (e.g., wheelchair or bedside chair)?: A Little Help needed to walk in hospital room?: A  Little Help needed climbing 3-5 steps with a railing? : A Lot 6 Click Score: 17    End of Session   Activity Tolerance: Patient tolerated treatment well;Patient limited by fatigue Patient left: in chair Nurse Communication: Mobility status PT Visit Diagnosis: Unsteadiness on feet (R26.81);Other abnormalities of gait and mobility (R26.89);Muscle weakness (generalized) (M62.81)    Time: [id number] PT Time Calculation (min) (ACUTE ONLY): 28 min   Charges:   PT Evaluation $PT Eval Moderate Complexity: 1 Mod PT Treatments $Therapeutic Activity: 23-37 mins        1:55 PM, 02/17/19 Lonell Grandchild, MPT Physical Therapist with [facility] [phone] office [phone] mobile phone

## 2019-02-17 NOTE — NC FL2 (Signed)
[place] LEVEL OF CARE SCREENING TOOL     IDENTIFICATION  Patient Name: [name] Birthdate: 08-Jul-1945 Sex: male Admission Date (Current Location): 02/15/2019  [facility] and [place] Number:  [employer] and Address:  [place] [address], [place]      Provider Number: [id number]  Attending Physician Name and Address:  Roxan Hockey, MD  Relative Name and Phone Number:       Current Level of Care: Hospital Recommended Level of Care: [employer] Prior Approval Number:    Date Approved/Denied:   PASRR Number: [id number]  Discharge Plan: SNF    Current Diagnoses: Patient Active Problem List   Diagnosis Date Noted  . HCAP (healthcare-associated pneumonia) 02/16/2019  . Pneumonia due to COVID-19 virus 02/16/2019  . Acute respiratory disease due to COVID-19 virus 02/16/2019  . Acute renal failure superimposed on stage 3a chronic kidney disease ([place]) 02/02/2019  . Complete heart block ([place]) 02/02/2019  . Atrial fibrillation, chronic ([place]) 02/02/2019  . COVID-19 02/01/2019  . Esophageal dysmotility 08/15/2018  . Gastroesophageal reflux disease 08/15/2018  . Nail problem 07/10/2018  . Right knee pain 03/13/2018  . Degenerative joint disease of knee, left 03/13/2018  . Nondisplaced longitudinal fracture of right patella, initial encounter for closed fracture 03/13/2018  . Chondrocalcinosis 02/05/2018  . Acute respiratory failure with hypoxia ([place]) 01/16/2018  . Elevated troponin 01/16/2018  . Weakness 12/21/2017  . Hypersomnolence 09/20/2017  . Acute renal failure superimposed on stage 2 chronic kidney disease ([place]) 06/16/2017  . Left cervical radiculopathy 02/07/2017  . Trochanteric bursitis, right hip 01/01/2017  . Right hip pain 12/26/2016  . Knee instability, right 11/03/2016  . Urinary frequency 10/20/2016  . Right foot pain 09/29/2016  . Prepatellar bursitis 06/29/2016  . Left knee  pain 06/28/2016  . Ingrown nail 06/28/2016  . Acute gouty arthritis 05/07/2016  . Left shoulder pain 02/09/2016  . Memory loss 01/19/2016  . Ingrown toenail 09/13/2015  . Renal insufficiency 09/08/2015  . Right leg swelling 07/13/2015  . Leg wound, right 06/15/2015  . Rash 06/06/2015  . Microcytic anemia 05/24/2015  . Cellulitis of leg, right 05/13/2015  . Greater trochanteric bursitis of left hip 08/12/2014  . Left leg pain 07/15/2014  . 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 Lumbar stenosis with neurogenic claudication 03/23/2014  . Long term current use of anticoagulant therapy 02/27/2014  . Dysphagia, pharyngoesophageal phase 12/01/2013  . 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 Dysphagia 11/11/2013  . Lumbago 11/03/2013  . Low back pain radiating to right leg 11/03/2013  . Abnormality of gait 11/03/2013  . Difficulty walking 11/03/2013  . Loss of weight 10/29/2013  . Atrial fibrillation ([place]) 08/26/2013  . Chest pain 04/05/2013  . Plantar fasciitis of right foot 02/18/2013  . Bruit 08/21/2012  . Sciatica of left side 08/15/2012  . Left hip pain 08/15/2012  . Headache(784.0) 05/03/2012  . Increased prostate specific antigen (PSA) velocity 11/11/2011  . Right sided sciatica 11/10/2011  . Chronic low back pain 05/03/2011  . Aortic root dilatation ([place]) 02/02/2011  . Wheezing without diagnosis of asthma 02/02/2011  . Syncope 12/27/2010  . Left lumbar radiculopathy 05/30/2010  . Wellness examination 05/30/2010  . Atrioventricular block, complete ([place]) 09/04/2008  . DOE (dyspnea on exertion) 06/10/2008  . DIASTOLIC HEART FAILURE, CHRONIC 06/09/2008  . KNEE PAIN, BILATERAL 04/21/2008  . LEG PAIN, BILATERAL 04/21/2008  . 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 PACEMAKER,  PERMANENT 03/04/2008  . INSOMNIA-SLEEP DISORDER-UNSPEC 10/23/2007  . Lumbar radiculopathy, chronic 06/10/2007  . GOUT 04/22/2007  . NEOPLASM,  MALIGNANT, PROSTATE 11/26/2006  . ERECTILE DYSFUNCTION 11/26/2006  . ALLERGIC RHINITIS 11/26/2006  . Type 2 diabetes, uncontrolled, with neuropathy ([place]) 08/29/2006  . Hyperlipidemia 08/29/2006  . Overweight(278.02) 08/29/2006  . Depression 08/29/2006  . Essential hypertension 08/29/2006  . CORONARY ARTERY DISEASE 08/29/2006  . BENIGN PROSTATIC HYPERTROPHY 08/29/2006    Orientation RESPIRATION BLADDER Height & Weight     Self, Time, Situation, Place  Normal Continent Weight: 218 lb 14.7 oz (99.3 kg) Height:  5\' 11"  (180.3 cm)  BEHAVIORAL SYMPTOMS/MOOD NEUROLOGICAL BOWEL NUTRITION STATUS      Continent Diet(heart healthy/carb modified)  AMBULATORY STATUS COMMUNICATION OF NEEDS Skin   Limited Assist Verbally Normal                       Personal Care Assistance Level of Assistance  Bathing, Dressing, Feeding Bathing Assistance: Limited assistance Feeding assistance: Independent Dressing Assistance: Limited assistance     Functional Limitations Info  Sight, Hearing, Speech Sight Info: Adequate Hearing Info: Adequate Speech Info: Adequate    SPECIAL CARE FACTORS FREQUENCY  PT (By licensed PT)     PT Frequency: 5x/week              Contractures Contractures Info: Not present    Additional Factors Info  Isolation Precautions Code Status Info: DNR Allergies Info: Crestor, Latex     Isolation Precautions Info: COVID+     Current Medications (02/17/2019):  This is the current hospital active medication list Current Facility-Administered Medications  Medication Dose Route Frequency Provider Last Rate Last Admin  . acetaminophen (TYLENOL) tablet 650 mg  650 mg Oral Q6H PRN Reubin Milan, MD   650 mg at 02/16/19 [id number]   Or  . acetaminophen (TYLENOL) suppository 650 mg  650 mg Rectal Q6H PRN Reubin Milan, MD      . ascorbic acid (VITAMIN C) tablet 500 mg  500 mg Oral Daily Reubin Milan, MD   500 mg at 02/17/19 [id number]  . carvedilol (COREG)  tablet 12.5 mg  12.5 mg Oral BID WC Emokpae, Courage, MD   12.5 mg at 02/17/19 0840  . chlorpheniramine-HYDROcodone (TUSSIONEX) 10-8 MG/5ML suspension 5 mL  5 mL Oral Q12H PRN Reubin Milan, MD      . dexamethasone (DECADRON) injection 6 mg  6 mg Intravenous Q24H Reubin Milan, MD   6 mg at 02/17/19 0525  . guaiFENesin-dextromethorphan (ROBITUSSIN DM) 100-10 MG/5ML syrup 10 mL  10 mL Oral Q4H PRN Reubin Milan, MD      . Ipratropium-Albuterol (COMBIVENT) respimat 2 puff  2 puff Inhalation Q6H PRN [place] Brick, Courage, MD      . labetalol (NORMODYNE) injection 10 mg  10 mg Intravenous Q4H PRN Emokpae, Courage, MD      . ondansetron (ZOFRAN) tablet 4 mg  4 mg Oral Q6H PRN Reubin Milan, MD       Or  . ondansetron [facility]) injection 4 mg  4 mg Intravenous Q6H PRN Reubin Milan, MD      . pantoprazole (PROTONIX) EC tablet 40 mg  40 mg Oral Daily Roxan Hockey, MD   40 mg at 02/17/19 0839  . tamsulosin (FLOMAX) capsule 0.4 mg  0.4 mg Oral QPC supper Denton Brick, Courage, MD   0.4 mg at 02/16/19 1743  . Warfarin - Pharmacist Dosing Inpatient   Does not apply [id number]  Reubin Milan, MD   Stopped at 02/16/19 1403  . zinc sulfate capsule 220 mg  220 mg Oral Daily Reubin Milan, MD   220 mg at 02/17/19 [id number]     Discharge Medications: Please see discharge summary for a list of discharge medications.  Relevant Imaging Results:  Relevant Lab Results:   Additional Information SSN [id number], Clydene Pugh, LCSW

## 2019-02-17 NOTE — Progress Notes (Signed)
Patient Demographics:    [name], is a 74 y.o. male, DOB - [birth year], [id number]  Admit date - 02/15/2019   Admitting Physician Reubin Milan, MD  Outpatient Primary MD for the patient is Biagio Borg, MD  LOS - 1   Chief Complaint  Patient presents with  . Fall        Subjective:    [name] today has no fevers, no emesis,  No chest pain,   Some dyspnea on exertion persist, cough is improving  Assessment  & Plan :    Principal Problem:   Pneumonia due to COVID-19 virus Active Problems:   Type 2 diabetes, uncontrolled, with neuropathy ([place])   Hyperlipidemia   GOUT   Essential hypertension   CAD, AUTOLOGOUS BYPASS GRAFT   Lumbar radiculopathy, chronic   Atrial fibrillation (HCC)   Spinal stenosis, lumbar region, with neurogenic claudication   Microcytic anemia   Gastroesophageal reflux disease   HCAP (healthcare-associated pneumonia)   Acute respiratory disease due to COVID-19 virus   Brief summary 73 y.o.malewith medical history significant foratrial fibrillation/flutter status post permanent pacemaker placement on anticoagulation with Coumadin, BPH, CAD with prior CABG, hypertension, dyslipidemia, diabetes, chronic gout, peripheral neuropathy admitted on 02/16/2019 with acute hypoxic respiratory failure secondary to Covid pneumonia  A/p 1)Acute hypoxic respiratory failure secondary to Covid pneumonia-- Patienthad first courseof remdesivir from 02/01/19 to 02/05/19 Patient received remdesivir 200 mg IV x 1 on 02/16/2019, - -will not order further remdesivir at this time -Continue Decadron -Patient received Actemra x1 dose, will not order further Actemra at this time -Continue mucolytics, bronchodilators --okay to wean off oxygen  2) chronic anticoagulation--- supratherapeutic INR with left groin hematoma/bruising -6.5, patient received vitamin K 2.5 mg x 1,  repeat INR 4.9, continue to hold Coumadin -Follow CBC AND INR  3) left hip pain/left groin bruising/hematoma--- in the setting of supratherapeutic INR, no definite trauma, left hip x-rays without acute findings -INR trending down  4)Chronic atrial fibrillation/status post PPM--- INR management as above in #2, continue Coreg for rate control  5)DM2-hold glipizide, Metformin and Januvia,  -use Novolog/Humalog Sliding scale insulin with Accu-Cheks/Fingersticks as ordered   6)CAD/s/p Prior CABG-stable, chest pain-free, continue Coreg and atorvastatin  7) leukocytosis--- suspect steroid-induced leukocytosis with white count down to 14.7 from 18 K   8)HTN--hold Lasix and lisinopril, Coreg as ordered,   may use IV labetalol when necessary  Every 4 hours for systolic blood pressure over 160 mmhg  9)Lt Hip pain /left groin hematoma/bruising-----  physical therapy eval appreciated, recommends SNF rehab  Disposition/Need for in-Hospital Stay- patient unable to be discharged at this time due to -awaiting SNF bed placement  Code Status : DNR  Family Communication:   NA (patient is alert, awake and coherent)   Disposition Plan  : TBD  Consults  :  na  DVT Prophylaxis  : Coumadin on hold due to supratherapeutic INR  Lab Results  Component Value Date   PLT 142 (L) 02/17/2019    Inpatient Medications  Scheduled Meds: . vitamin C  500 mg Oral Daily  . carvedilol  12.5 mg Oral BID WC  . dexamethasone (DECADRON) injection  6 mg Intravenous Q24H  . pantoprazole  40 mg Oral Daily  .  tamsulosin  0.4 mg Oral QPC supper  . traZODone  100 mg Oral QHS  . Warfarin - Pharmacist Dosing Inpatient   Does not apply q1800  . zinc sulfate  220 mg Oral Daily   Continuous Infusions: PRN Meds:.acetaminophen **OR** acetaminophen, chlorpheniramine-HYDROcodone, guaiFENesin-dextromethorphan, Ipratropium-Albuterol, labetalol, ondansetron **OR** ondansetron (ZOFRAN) IV   Anti-infectives (From  admission, onward)   Start     Dose/Rate Route Frequency Ordered Stop   02/17/19 1000  remdesivir 100 mg in sodium chloride 0.9 % 100 mL IVPB  Status:  Discontinued     100 mg 200 mL/hr over 30 Minutes Intravenous Daily 02/16/19 0538 02/16/19 1438   02/16/19 0700  remdesivir 200 mg in sodium chloride 0.9% 250 mL IVPB     200 mg 580 mL/hr over 30 Minutes Intravenous Once 02/16/19 0538 02/16/19 0915   02/16/19 0300  vancomycin (VANCOCIN) IVPB 1000 mg/200 mL premix     1,000 mg 200 mL/hr over 60 Minutes Intravenous  Once 02/16/19 0250 02/16/19 0502   02/16/19 0300  piperacillin-tazobactam (ZOSYN) IVPB 3.375 g     3.375 g 100 mL/hr over 30 Minutes Intravenous  Once 02/16/19 0250 02/16/19 0503        Objective:   Vitals:   02/16/19 1921 02/16/19 2114 02/17/19 0624 02/17/19 1300  BP:  (!) 125/93 124/72 119/81  Pulse:  (!) 59 60 60  Resp:  20 20 19   Temp:  97.6 F (36.4 C) 98 F (36.7 C) 97.6 F (36.4 C)  TempSrc:  Oral Oral Oral  SpO2: 99% 97%    Weight:      Height:        Wt Readings from Last 3 Encounters:  02/15/19 99.3 kg  02/01/19 99.3 kg  01/01/19 99.3 kg     Intake/Output Summary (Last 24 hours) at 02/17/2019 1401 Last data filed at 02/17/2019 1300 Gross per 24 hour  Intake 480 ml  Output 1200 ml  Net -720 ml     Physical Exam  Gen:- Awake Alert, no conversational dyspnea HEENT:- [place].AT, No sclera icterus Neck-Supple Neck,No JVD,.  Lungs-diminished in bases with scattered rhonchi and wheezes CV- S1, S2 normal, regular  Abd-  +ve B.Sounds, Abd Soft, No tenderness,    Extremity/Skin:- No  edema, pedal pulses present  Psych-affect is appropriate, oriented x3 Neuro-generalized weakness, no new focal deficits, no tremors MSK-left anterior medial groin area with resolving hematoma/ecchymosis   Data Review:   Micro Results Recent Results (from the past 240 hour(s))  Blood culture (routine x 2)     Status: None (Preliminary result)   Collection Time:  02/16/19 12:53 AM   Specimen: Right Antecubital; Blood  Result Value Ref Range Status   Specimen Description RIGHT ANTECUBITAL  Final   Special Requests   Final    BOTTLES DRAWN AEROBIC AND ANAEROBIC Blood Culture adequate volume   Culture   Final    NO GROWTH 1 DAY Performed at [facility], [address] [place], [place] [address]    Report Status PENDING  Incomplete  Blood culture (routine x 2)     Status: None (Preliminary result)   Collection Time: 02/16/19  5:44 AM   Specimen: Left Antecubital; Blood  Result Value Ref Range Status   Specimen Description   Final    LEFT ANTECUBITAL BOTTLES DRAWN AEROBIC AND ANAEROBIC   Special Requests Blood Culture adequate volume  Final   Culture   Final    NO GROWTH 1 DAY Performed at [facility], [place]  [address], [place],  [address]    Report Status PENDING  Incomplete    Radiology Reports DG Chest Portable 1 View  Result Date: 02/15/2019 CLINICAL DATA:  Fever. COVID positive 02/01/2019 EXAM: PORTABLE CHEST 1 VIEW COMPARISON:  Radiograph 02/01/2019. FINDINGS: Post median sternotomy. Left-sided pacemaker in place. Cardiomegaly mediastinal contours are unchanged. Low lung volumes persist. Patchy bilateral airspace disease, slightly worsened in the interim. No pulmonary edema, large pleural effusion or pneumothorax. No acute osseous abnormalities are seen. IMPRESSION: 1. Low lung volumes with slight worsening patchy bilateral airspace disease over the past 2 weeks, likely progressive pneumonia. 2. Stable cardiomegaly with pacemaker in place. Electronically Signed   By: Keith Rake M.D.   On: 02/15/2019 23:57   DG Chest Port 1 View  Result Date: 02/01/2019 CLINICAL DATA:  Shortness of breath. Additional history provided: Fever, cough and shortness of breath as well as generalized weakness. COVID test pending. EXAM: PORTABLE CHEST 1 VIEW COMPARISON:  Chest radiograph 01/16/2018 FINDINGS: Prior median sternotomy. Redemonstrated left  chest dual lead pacer device. Overlying cardiac monitoring leads. Cardiomegaly.  Aortic atherosclerosis. Shallow inspiration radiograph. Mild bibasilar opacities may reflect incomplete atelectasis or developing pneumonia. No sizable pleural effusion or evidence of pneumothorax. No acute bony abnormality. IMPRESSION: Shallow inspiration radiograph. Bibasilar opacities may reflect incomplete atelectasis and/or pneumonia. Cardiomegaly.  Aortic atherosclerosis. Electronically Signed   By: Kellie Simmering DO   On: 02/01/2019 10:03   Left HIP Xray UNILAT WITH PELVIS 2-3 VIEWS LEFT  Result Date: 02/16/2019 CLINICAL DATA:  [place]. Left hip pain. EXAM: DG HIP (WITH OR WITHOUT PELVIS) 2-3V LEFT COMPARISON:  None. FINDINGS: Both hips are normally located. No definite hip fractures. The pubic symphysis and SI joints are intact. No obvious pelvic fractures. If symptoms persist CT may be helpful for further evaluation. IMPRESSION: No definite hip or pelvic fractures. CT may be helpful for further evaluation if symptoms persist. Electronically Signed   By: Marijo Sanes M.D.   On: 02/16/2019 15:40     CBC Recent Labs  Lab 02/16/19 0052 02/17/19 0519  WBC 18.2* 14.7*  HGB 10.1* 11.6*  HCT 33.2* 39.0  PLT 151 142*  MCV 79.2* 80.2  MCH 24.1* 23.9*  MCHC 30.4 29.7*  RDW 18.6* 18.8*  LYMPHSABS 1.1 0.6*  MONOABS 0.9 0.5  EOSABS 0.0 0.0  BASOSABS 0.0 0.0    Chemistries  Recent Labs  Lab 02/16/19 0052 02/17/19 0519  NA 132* 137  K 3.6 4.6  CL 92* 95*  CO2 31 27  GLUCOSE 171* 230*  BUN 26* 39*  CREATININE 1.22 1.08  CALCIUM 8.1* 8.6*  MG  --  1.6*  AST  --  25  ALT  --  25  ALKPHOS  --  62  BILITOT  --  1.2   ------------------------------------------------------------------------------------------------------------------ No results for input(s): CHOL, HDL, LDLCALC, TRIG, CHOLHDL, LDLDIRECT in the last 72 hours.  Lab Results  Component Value Date   HGBA1C 6.4 (H) 02/03/2019    ------------------------------------------------------------------------------------------------------------------ No results for input(s): TSH, T4TOTAL, T3FREE, THYROIDAB in the last 72 hours.  Invalid input(s): FREET3 ------------------------------------------------------------------------------------------------------------------ Recent Labs    02/16/19 0053 02/17/19 0519  FERRITIN 140 196    Coagulation profile Recent Labs  Lab 02/16/19 0051 02/16/19 1509 02/17/19 0519  INR 6.5* 5.4* 4.9*    Recent Labs    02/16/19 0307 02/17/19 0519  DDIMER 1.39* 2.12*    Cardiac Enzymes No results for input(s): CKMB, TROPONINI, MYOGLOBIN in the last 168 hours.  Invalid input(s): CK ------------------------------------------------------------------------------------------------------------------  Component Value Date/Time   BNP 332.0 (H) 01/16/2018 1120     Roxan Hockey M.D on 02/17/2019 at 2:01 PM  Go to [email or web address] - for contact info  [facility] - Office  [phone]

## 2019-02-17 NOTE — TOC Progression Note (Signed)
Transition of Care [facility]) - Progression Note    Patient Details  Name: [name] MRN: [id number] Date of Birth: 06/11/1945  [facility]) CM/SW Contact  Ihor Gully, LCSW Phone Number: 02/17/2019, 3:56 PM  Clinical Narrative:    Return call from [name] advising that someone else had spoke to patient and he is now agreeable to go to SNF in [place].    Expected Discharge Plan: [place] Barriers to Discharge: No Barriers Identified  Expected Discharge Plan and Services Expected Discharge Plan: [place] arrangements for the past 2 months: Single Family Home                                       Social Determinants of Health (SDOH) Interventions    Readmission Risk Interventions No flowsheet data found.

## 2019-02-17 NOTE — Progress Notes (Signed)
ANTICOAGULATION CONSULT NOTE -  Pharmacy Consult for warfarin dosing Indication:  Atrial fibrillation w/ permanent pacemaker  Allergies  Allergen Reactions  . Crestor [Rosuvastatin Calcium] Other (See Comments)    Urination of blood  . Latex Rash and Other (See Comments)    Gloves cause welts on hands!!    Patient Measurements: Height: 5\' 11"  (180.3 cm) Weight: 218 lb 14.7 oz (99.3 kg) IBW/kg (Calculated) : 75.3  HEPARIN DW (KG): 95.7  Vital Signs: Temp: 98 F (36.7 C) (01/18 0624) Temp Source: Oral (01/18 0624) BP: 124/72 (01/18 0624) Pulse Rate: 60 (01/18 0624)  Labs: Recent Labs    02/16/19 0051 02/16/19 0052 02/16/19 1509 02/17/19 0519  HGB  --  10.1*  --  11.6*  HCT  --  33.2*  --  39.0  PLT  --  151  --  142*  LABPROT 57.2*  --  49.7* 46.1*  INR 6.5*  --  5.4* 4.9*  CREATININE  --  1.22  --  1.08    Estimated Creatinine Clearance: 73.2 mL/min (by C-G formula based on SCr of 1.08 mg/dL).   Assessment: Pharmacy consulted to dose warfarin for this 74 yo male with exacerbation of CoVid-19 pneumonia. He takes warfarin at home chronically for atrial fibrillation and has a permanent pacemaker. Unable to verify patient's home warfarin regimen at this time and patient is unable to tell when he had his last dose of warfarin.   1/17  INR:   high at 6.5, patient was given phytonadione 2.5mg  po x1 dose. 1/18 INR 4.9, remains supratherapeutic  Goal of Therapy:  INR 2-3 Monitor platelets by anticoagulation protocol: Yes   Plan:  Hold warfarin today Daily INR and CBC Monitor for signs and symptoms of bleeding   Isac Sarna, BS Vena [place], BCPS Clinical Pharmacist Pager [phone] 02/17/2019 8:54 AM

## 2019-02-17 NOTE — NC FL2 (Deleted)
[place] LEVEL OF CARE SCREENING TOOL     IDENTIFICATION  Patient Name: [name] Birthdate: 03-May-1945 Sex: male Admission Date (Current Location): 02/15/2019  [facility] and [place] Number:  [employer] and Address:  [place] [address], [place]      Provider Number: [id number]  Attending Physician Name and Address:  Roxan Hockey, MD  Relative Name and Phone Number:       Current Level of Care: Hospital Recommended Level of Care: [place] Prior Approval Number:    Date Approved/Denied:   PASRR Number: [id number]  Discharge Plan: SNF    Current Diagnoses: Patient Active Problem List   Diagnosis Date Noted  . HCAP (healthcare-associated pneumonia) 02/16/2019  . Pneumonia due to COVID-19 virus 02/16/2019  . Acute respiratory disease due to COVID-19 virus 02/16/2019  . Acute renal failure superimposed on stage 3a chronic kidney disease ([place]) 02/02/2019  . Complete heart block ([place]) 02/02/2019  . Atrial fibrillation, chronic ([place]) 02/02/2019  . COVID-19 02/01/2019  . Esophageal dysmotility 08/15/2018  . Gastroesophageal reflux disease 08/15/2018  . Nail problem 07/10/2018  . Right knee pain 03/13/2018  . Degenerative joint disease of knee, left 03/13/2018  . Nondisplaced longitudinal fracture of right patella, initial encounter for closed fracture 03/13/2018  . Chondrocalcinosis 02/05/2018  . Acute respiratory failure with hypoxia ([place]) 01/16/2018  . Elevated troponin 01/16/2018  . Weakness 12/21/2017  . Hypersomnolence 09/20/2017  . Acute renal failure superimposed on stage 2 chronic kidney disease ([place]) 06/16/2017  . Left cervical radiculopathy 02/07/2017  . Trochanteric bursitis, right hip 01/01/2017  . Right hip pain 12/26/2016  . Knee instability, right 11/03/2016  . Urinary frequency 10/20/2016  . Right foot pain 09/29/2016  . Prepatellar bursitis 06/29/2016  . Left knee  pain 06/28/2016  . Ingrown nail 06/28/2016  . Acute gouty arthritis 05/07/2016  . Left shoulder pain 02/09/2016  . Memory loss 01/19/2016  . Ingrown toenail 09/13/2015  . Renal insufficiency 09/08/2015  . Right leg swelling 07/13/2015  . Leg wound, right 06/15/2015  . Rash 06/06/2015  . Microcytic anemia 05/24/2015  . Cellulitis of leg, right 05/13/2015  . Greater trochanteric bursitis of left hip 08/12/2014  . Left leg pain 07/15/2014  . 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 Lumbar stenosis with neurogenic claudication 03/23/2014  . Long term current use of anticoagulant therapy 02/27/2014  . Dysphagia, pharyngoesophageal phase 12/01/2013  . 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 Dysphagia 11/11/2013  . Lumbago 11/03/2013  . Low back pain radiating to right leg 11/03/2013  . Abnormality of gait 11/03/2013  . Difficulty walking 11/03/2013  . Loss of weight 10/29/2013  . Atrial fibrillation ([place]) 08/26/2013  . Chest pain 04/05/2013  . Plantar fasciitis of right foot 02/18/2013  . Bruit 08/21/2012  . Sciatica of left side 08/15/2012  . Left hip pain 08/15/2012  . Headache(784.0) 05/03/2012  . Increased prostate specific antigen (PSA) velocity 11/11/2011  . Right sided sciatica 11/10/2011  . Chronic low back pain 05/03/2011  . Aortic root dilatation ([place]) 02/02/2011  . Wheezing without diagnosis of asthma 02/02/2011  . Syncope 12/27/2010  . Left lumbar radiculopathy 05/30/2010  . Wellness examination 05/30/2010  . Atrioventricular block, complete ([place]) 09/04/2008  . DOE (dyspnea on exertion) 06/10/2008  . DIASTOLIC HEART FAILURE, CHRONIC 06/09/2008  . KNEE PAIN, BILATERAL 04/21/2008  . LEG PAIN, BILATERAL 04/21/2008  . 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 PACEMAKER,  PERMANENT 03/04/2008  . INSOMNIA-SLEEP DISORDER-UNSPEC 10/23/2007  . Lumbar radiculopathy, chronic 06/10/2007  . GOUT 04/22/2007  . NEOPLASM,  MALIGNANT, PROSTATE 11/26/2006  . ERECTILE DYSFUNCTION 11/26/2006  . ALLERGIC RHINITIS 11/26/2006  . Type 2 diabetes, uncontrolled, with neuropathy ([facility]) 08/29/2006  . Hyperlipidemia 08/29/2006  . Overweight(278.02) 08/29/2006  . Depression 08/29/2006  . Essential hypertension 08/29/2006  . CORONARY ARTERY DISEASE 08/29/2006  . BENIGN PROSTATIC HYPERTROPHY 08/29/2006    Orientation RESPIRATION BLADDER Height & Weight     Self, Time, Situation, Place  Normal Continent Weight: 218 lb 14.7 oz (99.3 kg) Height:  5\' 11"  (180.3 cm)  BEHAVIORAL SYMPTOMS/MOOD NEUROLOGICAL BOWEL NUTRITION STATUS      Continent Diet(heart healthy/carb modified)  AMBULATORY STATUS COMMUNICATION OF NEEDS Skin   Limited Assist Verbally Normal                       Personal Care Assistance Level of Assistance  Bathing, Dressing, Feeding Bathing Assistance: Limited assistance Feeding assistance: Independent Dressing Assistance: Limited assistance     Functional Limitations Info  Sight, Hearing, Speech Sight Info: Adequate Hearing Info: Adequate Speech Info: Adequate    SPECIAL CARE FACTORS FREQUENCY  PT (By licensed PT)     PT Frequency: 5x/week              Contractures Contractures Info: Not present    Additional Factors Info  Code Status, Allergies Code Status Info: DNR Allergies Info: Crestor, Latex           Current Medications (02/17/2019):  This is the current hospital active medication list Current Facility-Administered Medications  Medication Dose Route Frequency Provider Last Rate Last Admin  . acetaminophen (TYLENOL) tablet 650 mg  650 mg Oral Q6H PRN Reubin Milan, MD   650 mg at 02/16/19 [id number]   Or  . acetaminophen (TYLENOL) suppository 650 mg  650 mg Rectal Q6H PRN Reubin Milan, MD      . ascorbic acid (VITAMIN C) tablet 500 mg  500 mg Oral Daily Reubin Milan, MD   500 mg at 02/17/19 [id number]  . carvedilol (COREG) tablet 12.5 mg  12.5 mg Oral BID WC  Emokpae, Courage, MD   12.5 mg at 02/17/19 0840  . chlorpheniramine-HYDROcodone (TUSSIONEX) 10-8 MG/5ML suspension 5 mL  5 mL Oral Q12H PRN Reubin Milan, MD      . dexamethasone (DECADRON) injection 6 mg  6 mg Intravenous Q24H Reubin Milan, MD   6 mg at 02/17/19 0525  . guaiFENesin-dextromethorphan (ROBITUSSIN DM) 100-10 MG/5ML syrup 10 mL  10 mL Oral Q4H PRN Reubin Milan, MD      . Ipratropium-Albuterol (COMBIVENT) respimat 2 puff  2 puff Inhalation Q6H PRN [place] Brick, Courage, MD      . labetalol (NORMODYNE) injection 10 mg  10 mg Intravenous Q4H PRN Emokpae, Courage, MD      . ondansetron (ZOFRAN) tablet 4 mg  4 mg Oral Q6H PRN Reubin Milan, MD       Or  . ondansetron [facility]) injection 4 mg  4 mg Intravenous Q6H PRN Reubin Milan, MD      . pantoprazole (PROTONIX) EC tablet 40 mg  40 mg Oral Daily Roxan Hockey, MD   40 mg at 02/17/19 0839  . tamsulosin (FLOMAX) capsule 0.4 mg  0.4 mg Oral QPC supper Denton Brick, Courage, MD   0.4 mg at 02/16/19 1743  . Warfarin - Pharmacist Dosing Inpatient   Does not apply [place],  Gerri Lins, MD   Stopped at 02/16/19 1403  . zinc sulfate capsule 220 mg  220 mg Oral Daily Reubin Milan, MD   220 mg at 02/17/19 [id number]     Discharge Medications: Please see discharge summary for a list of discharge medications.  Relevant Imaging Results:  Relevant Lab Results:   Additional Information SSN [id number], Clydene Pugh, LCSW

## 2019-02-17 NOTE — TOC Initial Note (Signed)
Transition of Care [facility]) - Initial/Assessment Note    Patient Details  Name: [name] MRN: [id number] Date of Birth: 28-Oct-1945  [facility]) CM/SW Contact:    Ihor Gully, LCSW Phone Number: 02/17/2019, 11:33 AM  Clinical Narrative:                 Patient from home with spouse and dog. Admitted for pneumonia due to COVID-19 virus. Recommended to SNF for short term rehab. Discussed facilities accepting COVID+ patients. Patient wishes to discuss with spouse.  TOC Will follow up with patient once he has opportunity to discuss with spouse.    Expected Discharge Plan: Skilled Nursing Facility Barriers to Discharge: No Barriers Identified   Patient Goals and CMS Choice Patient states their goals for this hospitalization and ongoing recovery are:: Considering rehab CMS Medicare.gov Compare Post Acute Care list provided to:: Patient Choice offered to / list presented to : Patient  Expected Discharge Plan and Services Expected Discharge Plan: [place]       Living arrangements for the past 2 months: Single Family Home                                      Prior Living Arrangements/Services Living arrangements for the past 2 months: Single Family Home Lives with:: Spouse Patient language and need for interpreter reviewed:: Yes Do you feel safe going back to the place where you live?: Yes      Need for Family Participation in Patient Care: Yes (Comment) Care giver support system in place?: Yes (comment)   Criminal Activity/Legal Involvement Pertinent to Current Situation/Hospitalization: No - Comment as needed  Activities of Daily Living Home Assistive Devices/Equipment: Cane (specify quad or straight) ADL Screening (condition at time of admission) Patient's cognitive ability adequate to safely complete daily activities?: Yes Is the patient deaf or have difficulty hearing?: No Does the patient have difficulty seeing, even when  wearing glasses/contacts?: No Does the patient have difficulty concentrating, remembering, or making decisions?: Yes Patient able to express need for assistance with ADLs?: Yes Does the patient have difficulty dressing or bathing?: No Independently performs ADLs?: Yes (appropriate for developmental age) Dressing (OT): Needs assistance Is this a change from baseline?: Pre-admission baseline Grooming: Needs assistance Is this a change from baseline?: Pre-admission baseline Feeding: Independent Bathing: Needs assistance Is this a change from baseline?: Pre-admission baseline Toileting: Needs assistance Is this a change from baseline?: Pre-admission baseline In/Out Bed: Needs assistance Is this a change from baseline?: Pre-admission baseline Walks in Home: Needs assistance Is this a change from baseline?: Pre-admission baseline Does the patient have difficulty walking or climbing stairs?: Yes Weakness of Legs: Both Weakness of Arms/Hands: Both  Permission Sought/Granted                  Emotional Assessment Appearance:: Appears stated age   Affect (typically observed): Appropriate Orientation: : Oriented to Place, Oriented to Self, Oriented to  Time, Oriented to Situation Alcohol / Substance Use: Not Applicable Psych Involvement: No (comment)  Admission diagnosis:  Hip pain [M25.559] Elevated INR [R79.1] Hypoxia [R09.02] HCAP (healthcare-associated pneumonia) [J18.9] COVID-19 [U07.1] Patient Active Problem List   Diagnosis Date Noted  . HCAP (healthcare-associated pneumonia) 02/16/2019  . Pneumonia due to COVID-19 virus 02/16/2019  . Acute respiratory disease due to COVID-19 virus 02/16/2019  . Acute renal failure superimposed on stage 3a chronic kidney disease ([place])  02/02/2019  . Complete heart block ([place]) 02/02/2019  . Atrial fibrillation, chronic ([place]) 02/02/2019  . COVID-19 02/01/2019  . Esophageal dysmotility 08/15/2018  . Gastroesophageal reflux disease  08/15/2018  . Nail problem 07/10/2018  . Right knee pain 03/13/2018  . Degenerative joint disease of knee, left 03/13/2018  . Nondisplaced longitudinal fracture of right patella, initial encounter for closed fracture 03/13/2018  . Chondrocalcinosis 02/05/2018  . Acute respiratory failure with hypoxia ([place]) 01/16/2018  . Elevated troponin 01/16/2018  . Weakness 12/21/2017  . Hypersomnolence 09/20/2017  . Acute renal failure superimposed on stage 2 chronic kidney disease ([employer]) 06/16/2017  . Left cervical radiculopathy 02/07/2017  . Trochanteric bursitis, right hip 01/01/2017  . Right hip pain 12/26/2016  . Knee instability, right 11/03/2016  . Urinary frequency 10/20/2016  . Right foot pain 09/29/2016  . Prepatellar bursitis 06/29/2016  . Left knee pain 06/28/2016  . Ingrown nail 06/28/2016  . Acute gouty arthritis 05/07/2016  . Left shoulder pain 02/09/2016  . Memory loss 01/19/2016  . Ingrown toenail 09/13/2015  . Renal insufficiency 09/08/2015  . Right leg swelling 07/13/2015  . Leg wound, right 06/15/2015  . Rash 06/06/2015  . Microcytic anemia 05/24/2015  . Cellulitis of leg, right 05/13/2015  . Greater trochanteric bursitis of left hip 08/12/2014  . Left leg pain 07/15/2014  . 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 Lumbar stenosis with neurogenic claudication 03/23/2014  . Long term current use of anticoagulant therapy 02/27/2014  . Dysphagia, pharyngoesophageal phase 12/01/2013  . 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 Dysphagia 11/11/2013  . Lumbago 11/03/2013  . Low back pain radiating to right leg 11/03/2013  . Abnormality of gait 11/03/2013  . Difficulty walking 11/03/2013  . Loss of weight 10/29/2013  . Atrial fibrillation ([place]) 08/26/2013  . Chest pain 04/05/2013  . Plantar fasciitis of right foot 02/18/2013  . Bruit 08/21/2012  . Sciatica of left side 08/15/2012  . Left hip pain 08/15/2012  .  Headache(784.0) 05/03/2012  . Increased prostate specific antigen (PSA) velocity 11/11/2011  . Right sided sciatica 11/10/2011  . Chronic low back pain 05/03/2011  . Aortic root dilatation ([place]) 02/02/2011  . Wheezing without diagnosis of asthma 02/02/2011  . Syncope 12/27/2010  . Left lumbar radiculopathy 05/30/2010  . Wellness examination 05/30/2010  . Atrioventricular block, complete ([place]) 09/04/2008  . DOE (dyspnea on exertion) 06/10/2008  . DIASTOLIC HEART FAILURE, CHRONIC 06/09/2008  . KNEE PAIN, BILATERAL 04/21/2008  . LEG PAIN, BILATERAL 04/21/2008  . 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 PACEMAKER, PERMANENT 03/04/2008  . INSOMNIA-SLEEP DISORDER-UNSPEC 10/23/2007  . Lumbar radiculopathy, chronic 06/10/2007  . GOUT 04/22/2007  . 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 ERECTILE DYSFUNCTION 11/26/2006  . ALLERGIC RHINITIS 11/26/2006  . Type 2 diabetes, uncontrolled, with neuropathy ([place]) 08/29/2006  . Hyperlipidemia 08/29/2006  . Overweight(278.02) 08/29/2006  . Depression 08/29/2006  . Essential hypertension 08/29/2006  . CORONARY ARTERY DISEASE 08/29/2006  . BENIGN PROSTATIC HYPERTROPHY 08/29/2006   PCP:  Biagio Borg, MD Pharmacy:   [facility], [place] [address] [place] [address] Phone: [phone] Fax: [phone]  [id number] - [place], [place] [place] [address] Phone: [phone] Fax: [phone]     Social Determinants of Health (SDOH) Interventions    Readmission Risk Interventions No flowsheet data found.

## 2019-02-17 NOTE — Clinical Social Work Note (Signed)
Patient is expected to discharge to [facility] on 02/18/19 to room 201.    Christian Treadway, Clydene Pugh, LCSW

## 2019-02-17 NOTE — TOC Progression Note (Signed)
Transition of Care [facility]) - Progression Note    Patient Details  Name: [name] MRN: [id number] Date of Birth: Jan 24, 1946  Transition of [facility]) CM/SW Contact  Ihor Gully, LCSW Phone Number: 02/17/2019, 3:13 PM  Clinical Narrative:    Spoke with Mrs. Badilla. Wants patient to remain in [place] only for rehab. Discussed that there are only two facilities in [place] and neither can take him. Mrs. Der states that patient absolutely refuses to go to [place] [place] or outside of [place]. Mrs. Schmelzle is agreeable to HHPT services. Discussed potential choices. Mrs. Shotts states that she is active [facility] and would like for patient to have [place].  Referral for HHPT made to [facility] with [facility].    Expected Discharge Plan: [place] Barriers to Discharge: No Barriers Identified  Expected Discharge Plan and Services Expected Discharge Plan: [place] arrangements for the past 2 months: Single Family Home                                       Social Determinants of Health (SDOH) Interventions    Readmission Risk Interventions No flowsheet data found.

## 2019-02-17 NOTE — Progress Notes (Signed)
Initial Nutrition Assessment  DOCUMENTATION CODES:   Obesity unspecified  INTERVENTION:  Vital Shake with breakfast daily and Magic cup with lunch and dinner.    NUTRITION DIAGNOSIS:   Increased nutrient needs related to acute illness(COVID + -pneumonia) as evidenced by estimated needs.  GOAL:   Pt to meet >/= 90% of their estimated nutrition needs      MONITOR:  PO intake, Supplement acceptance, Labs, Weight trends, Skin, I & O's    REASON FOR ASSESSMENT:   Malnutrition Screening Tool    ASSESSMENT: Patient is a 74 yo male with history of DM 2, CHF, MI, CAD- has a pacemaker, gout, HTN and HLD.  On 1/2- patient presents from home febrile, dyspnea and productive cough. COVID positive. Started- O2 @ 2 liters. Discharged-home with wife on 1/6.  1/16 patient admitted following a fall. Febrile 102.8. Chest x-ray findings for worsening pneumonia. Previous admission pt was eating 50-75% of meals. Talked with his nurse today who reports meal intake ~50%. Will add regimen of oral supplements with trays to support an increase in intake. He is able to feed himself. SNF placement is being pursed this discharge. Recommend oral supplements be included with his discharge orders to minimize disruption in provision of nutrition given his increased needs.   Medications reviewed and include: Vitamin C, Zinc, Protonix, Decaron  Labs reviewed: BUN-39, Mag. 1.6, Alb 2.9, wbc-14.7. CRP-29.9.  CBG's (>200) : 236, 213, 207 mg/dl.  WEIGHTS: Need current wt. May 94.8 kg, August 93 kg, Nov. 97.5 kg and Dec/Jan-99.3 kg.  Intake/Output Summary (Last 24 hours) at 02/17/2019 1418 Last data filed at 02/17/2019 1300 Gross per 24 hour  Intake 480 ml  Output 1200 ml  Net -720 ml      NUTRITION - FOCUSED PHYSICAL EXAM: Unable to complete Nutrition-Focused physical exam at this time.  Deferred due to COVID precautions.  Diet Order:   Diet Order            Diet heart healthy/carb modified Room service  appropriate? Yes; Fluid consistency: Thin  Diet effective now              EDUCATION NEEDS:  Not appropriate for education at this time   Skin:  Skin Assessment: Reviewed RN Assessment(redness to groin)  Last BM:  1/17  Height:   Ht Readings from Last 1 Encounters:  02/15/19 5\' 11"  (1.803 m)    Weight:   Wt Readings from Last 1 Encounters:  02/15/19 99.3 kg    Ideal Body Weight:   78 kg  BMI:  Body mass index is 30.53 kg/m.  Estimated Nutritional Needs:   Kcal:  [id number]  Protein:  117-132  Fluid:  <2 liters daily   Colman Cater MS,RD,CSG,LDN Office: [phone] Pager: [phone]

## 2019-02-17 NOTE — Progress Notes (Signed)
Critical INR 4.9, MD notified.

## 2019-02-18 LAB — GLUCOSE, CAPILLARY
Glucose-Capillary: 371 mg/dL — ABNORMAL HIGH (ref 70–99)
Glucose-Capillary: 337 mg/dL — ABNORMAL HIGH (ref 70–99)
Glucose-Capillary: 338 mg/dL — ABNORMAL HIGH (ref 70–99)

## 2019-02-18 LAB — D-DIMER, QUANTITATIVE: D-Dimer, Quant: 1.91 ug/mL-FEU — ABNORMAL HIGH (ref 0.00–0.50)

## 2019-02-18 LAB — COMPREHENSIVE METABOLIC PANEL
ALT: 35 U/L (ref 0–44)
AST: 27 U/L (ref 15–41)
Albumin: 2.7 g/dL — ABNORMAL LOW (ref 3.5–5.0)
Alkaline Phosphatase: 61 U/L (ref 38–126)
Anion gap: 9 (ref 5–15)
BUN: 46 mg/dL — ABNORMAL HIGH (ref 8–23)
CO2: 29 mmol/L (ref 22–32)
Calcium: 8.4 mg/dL — ABNORMAL LOW (ref 8.9–10.3)
Chloride: 93 mmol/L — ABNORMAL LOW (ref 98–111)
Creatinine, Ser: 1.09 mg/dL (ref 0.61–1.24)
GFR calc Af Amer: >60 mL/min (ref >60)
GFR calc non Af Amer: >60 mL/min (ref >60)
Glucose, Bld: 305 mg/dL — ABNORMAL HIGH (ref 70–99)
Potassium: 4.6 mmol/L (ref 3.5–5.1)
Sodium: 131 mmol/L — ABNORMAL LOW (ref 135–145)
Total Bilirubin: 1 mg/dL (ref 0.3–1.2)
Total Protein: 6 g/dL — ABNORMAL LOW (ref 6.5–8.1)

## 2019-02-18 LAB — CBC
HCT: 34.4 % — ABNORMAL LOW (ref 39.0–52.0)
Hemoglobin: 10.5 g/dL — ABNORMAL LOW (ref 13.0–17.0)
MCH: 24.1 pg — ABNORMAL LOW (ref 26.0–34.0)
MCHC: 30.5 g/dL (ref 30.0–36.0)
MCV: 79.1 fL — ABNORMAL LOW (ref 80.0–100.0)
Platelets: 140 10*3/uL — ABNORMAL LOW (ref 150–400)
RBC: 4.35 MIL/uL (ref 4.22–5.81)
RDW: 18.7 % — ABNORMAL HIGH (ref 11.5–15.5)
WBC: 13.5 10*3/uL — ABNORMAL HIGH (ref 4.0–10.5)
nRBC: 0 % (ref 0.0–0.2)

## 2019-02-18 LAB — FERRITIN: Ferritin: 162 ng/mL (ref 24–336)

## 2019-02-18 LAB — MAGNESIUM: Magnesium: 2 mg/dL (ref 1.7–2.4)

## 2019-02-18 LAB — PROTIME-INR
INR: 6.1 (ref 0.8–1.2)
INR: 4.8 (ref 0.8–1.2)
Prothrombin Time: 54.6 seconds — ABNORMAL HIGH (ref 11.4–15.2)
Prothrombin Time: 45.2 seconds — ABNORMAL HIGH (ref 11.4–15.2)

## 2019-02-18 LAB — C-REACTIVE PROTEIN: CRP: 15.4 mg/dL — ABNORMAL HIGH (ref <1.0)

## 2019-02-18 LAB — PHOSPHORUS: Phosphorus: 3.5 mg/dL (ref 2.5–4.6)

## 2019-02-18 MED ORDER — INSULIN ASPART 100 UNIT/ML ~~LOC~~ SOLN
0.0000 [IU] | Freq: Every day | SUBCUTANEOUS | Status: DC
  Administered 2019-02-18: 4 [IU] via SUBCUTANEOUS

## 2019-02-18 MED ORDER — PHYTONADIONE 5 MG PO TABS
5.0000 mg | ORAL_TABLET | Freq: Once | ORAL | Status: AC
  Administered 2019-02-18: 5 mg via ORAL
  Filled 2019-02-18: qty 1

## 2019-02-18 MED ORDER — INSULIN ASPART 100 UNIT/ML ~~LOC~~ SOLN
0.0000 [IU] | Freq: Three times a day (TID) | SUBCUTANEOUS | Status: DC
  Administered 2019-02-18: 7 [IU] via SUBCUTANEOUS
  Administered 2019-02-18 – 2019-02-19 (×2): 9 [IU] via SUBCUTANEOUS
  Administered 2019-02-19: 5 [IU] via SUBCUTANEOUS
  Administered 2019-02-19: 7 [IU] via SUBCUTANEOUS

## 2019-02-18 MED ORDER — PHYTONADIONE 5 MG PO TABS
5.0000 mg | ORAL_TABLET | Freq: Once | ORAL | Status: AC
  Administered 2019-02-18: 5 mg via ORAL
  Filled 2019-02-18 (×2): qty 1

## 2019-02-18 NOTE — Progress Notes (Signed)
Patient Demographics:    [name], is a 74 y.o. male, DOB - 05-13-1945, [id number]  Admit date - 02/15/2019   Admitting Physician Reubin Milan, MD  Outpatient Primary MD for the patient is Biagio Borg, MD  LOS - 2   Chief Complaint  Patient presents with  . Fall        Subjective:    [name] today has no fevers, no emesis,  No chest pain,   -no bleeding concerns - No Nausea, Vomiting or Diarrhea Cough and shob improving   Assessment  & Plan :    Principal Problem:   Pneumonia due to COVID-19 virus Active Problems:   Type 2 diabetes, uncontrolled, with neuropathy ([place])   Hyperlipidemia   GOUT   Essential hypertension   CAD, AUTOLOGOUS BYPASS GRAFT   Lumbar radiculopathy, chronic   Atrial fibrillation (HCC)   Spinal stenosis, lumbar region, with neurogenic claudication   Microcytic anemia   Gastroesophageal reflux disease   HCAP (healthcare-associated pneumonia)   Acute respiratory disease due to COVID-19 virus   Brief summary 73 y.o.malewith medical history significant foratrial fibrillation/flutter status post permanent pacemaker placement on anticoagulation with Coumadin, BPH, CAD with prior CABG, hypertension, dyslipidemia, diabetes, chronic gout, peripheral neuropathy admitted on 02/16/2019 with acute hypoxic respiratory failure secondary to Covid pneumonia  A/p 1)Acute hypoxic respiratory failure secondary to Covid pneumonia-- Patienthad first courseof remdesivir from 02/01/19 to 02/05/19 Patient received remdesivir 200 mg IV x 1 on 02/16/2019, - -will not order further remdesivir at this time -Continue Decadron -Patient received Actemra x1 dose, will not order further Actemra at this time -Continue mucolytics, bronchodilators --okay to wean off oxygen  2) chronic anticoagulation--- supratherapeutic INR with left groin hematoma/bruising -INR back up  to 6.1, hemoglobin stable at 10.5 , patient previously received vitamin K 2.5 mg x 1, will give vitamin K 5 mg p.o. x1 , continue to hold Coumadin -Follow CBC AND INR  3) left hip pain/left groin bruising/hematoma--- in the setting of supratherapeutic INR, no definite trauma, left hip x-rays without acute findings -Monitor INR and CBC  4)Chronic atrial fibrillation/status post PPM--- INR management as above in #2, continue Coreg for rate control  5)DM2-hold glipizide, Metformin and Januvia,  -use Novolog/Humalog Sliding scale insulin with Accu-Cheks/Fingersticks as ordered   6)CAD/s/p Prior CABG-stable, chest pain-free, continue Coreg and atorvastatin  7) leukocytosis--- suspect steroid-induced leukocytosis with white count down to 14.7 from 18 K   8)HTN--hold Lasix and lisinopril, Coreg as ordered,   may use IV labetalol when necessary  Every 4 hours for systolic blood pressure over 160 mmhg  9)Lt Hip pain /left groin hematoma/bruising-----  physical therapy eval appreciated, recommends SNF rehab  Disposition/Need for in-Hospital Stay- patient unable to be discharged at this time due to -awaiting SNF bed placement  Code Status : DNR  Family Communication:   (patient is alert, awake and coherent) Discussed with wife---[phone]  Disposition Plan  : SNF   Consults  :  na  DVT Prophylaxis  : Coumadin on hold due to supratherapeutic INR  Lab Results  Component Value Date   PLT 140 (L) 02/18/2019    Inpatient Medications  Scheduled Meds: . vitamin C  500 mg Oral Daily  . carvedilol  12.5 mg Oral BID WC  . dexamethasone (DECADRON) injection  6 mg Intravenous Q24H  . insulin aspart  0-5 Units Subcutaneous QHS  . insulin aspart  0-9 Units Subcutaneous TID WC  . pantoprazole  40 mg Oral Daily  . tamsulosin  0.4 mg Oral QPC supper  . traZODone  100 mg Oral QHS  . Warfarin - Pharmacist Dosing Inpatient   Does not apply q1800  . zinc sulfate  220 mg Oral Daily    Continuous Infusions: PRN Meds:.acetaminophen **OR** acetaminophen, chlorpheniramine-HYDROcodone, guaiFENesin-dextromethorphan, Ipratropium-Albuterol, labetalol, ondansetron **OR** ondansetron (ZOFRAN) IV   Anti-infectives (From admission, onward)   Start     Dose/Rate Route Frequency Ordered Stop   02/17/19 1000  remdesivir 100 mg in sodium chloride 0.9 % 100 mL IVPB  Status:  Discontinued     100 mg 200 mL/hr over 30 Minutes Intravenous Daily 02/16/19 0538 02/16/19 1438   02/16/19 0700  remdesivir 200 mg in sodium chloride 0.9% 250 mL IVPB     200 mg 580 mL/hr over 30 Minutes Intravenous Once 02/16/19 0538 02/16/19 0915   02/16/19 0300  vancomycin (VANCOCIN) IVPB 1000 mg/200 mL premix     1,000 mg 200 mL/hr over 60 Minutes Intravenous  Once 02/16/19 0250 02/16/19 0502   02/16/19 0300  piperacillin-tazobactam (ZOSYN) IVPB 3.375 g     3.375 g 100 mL/hr over 30 Minutes Intravenous  Once 02/16/19 0250 02/16/19 0503        Objective:   Vitals:   02/17/19 2052 02/17/19 2147 02/18/19 0516 02/18/19 1437  BP:  117/68 125/78 125/83  Pulse:  (!) 58 60 60  Resp:  20 20 16   Temp:  98.2 F (36.8 C) 97.9 F (36.6 C) 98.2 F (36.8 C)  TempSrc:  Oral Oral Oral  SpO2: 97% 91% 97% 94%  Weight:      Height:        Wt Readings from Last 3 Encounters:  02/15/19 99.3 kg  02/01/19 99.3 kg  01/01/19 99.3 kg     Intake/Output Summary (Last 24 hours) at 02/18/2019 1656 Last data filed at 02/18/2019 1200 Gross per 24 hour  Intake 600 ml  Output 600 ml  Net 0 ml     Physical Exam  Gen:- Awake Alert, no conversational dyspnea HEENT:- [place].AT, No sclera icterus Neck-Supple Neck,No JVD,.  Lungs-diminished in bases with scattered rhonchi and wheezes CV- S1, S2 normal, regular, prior sternotomy scar left subclavian pacemaker in situ Abd-  +ve B.Sounds, Abd Soft, No tenderness,    Extremity/Skin:- No  edema, pedal pulses present  Psych-affect is appropriate, oriented  x3 Neuro-generalized weakness, no new focal deficits, no tremors MSK-Resolving left anterior medial groin area with resolving hematoma/ecchymosis   Data Review:   Micro Results Recent Results (from the past 240 hour(s))  Blood culture (routine x 2)     Status: None (Preliminary result)   Collection Time: 02/16/19 12:53 AM   Specimen: Right Antecubital; Blood  Result Value Ref Range Status   Specimen Description RIGHT ANTECUBITAL  Final   Special Requests   Final    BOTTLES DRAWN AEROBIC AND ANAEROBIC Blood Culture adequate volume   Culture   Final    NO GROWTH 2 DAYS Performed at [facility], [address] [place], [place] [address]    Report Status PENDING  Incomplete  Blood culture (routine x 2)     Status: None (Preliminary result)   Collection Time: 02/16/19  5:44 AM   Specimen: Left Antecubital; Blood  Result  Value Ref Range Status   Specimen Description   Final    LEFT ANTECUBITAL BOTTLES DRAWN AEROBIC AND ANAEROBIC   Special Requests Blood Culture adequate volume  Final   Culture   Final    NO GROWTH 2 DAYS Performed at [facility], [address], [place], [place] [address]    Report Status PENDING  Incomplete    Radiology Reports DG Chest Portable 1 View  Result Date: 02/15/2019 CLINICAL DATA:  Fever. COVID positive 02/01/2019 EXAM: PORTABLE CHEST 1 VIEW COMPARISON:  Radiograph 02/01/2019. FINDINGS: Post median sternotomy. Left-sided pacemaker in place. Cardiomegaly mediastinal contours are unchanged. Low lung volumes persist. Patchy bilateral airspace disease, slightly worsened in the interim. No pulmonary edema, large pleural effusion or pneumothorax. No acute osseous abnormalities are seen. IMPRESSION: 1. Low lung volumes with slight worsening patchy bilateral airspace disease over the past 2 weeks, likely progressive pneumonia. 2. Stable cardiomegaly with pacemaker in place. Electronically Signed   By: Keith Rake M.D.   On: 02/15/2019 23:57   DG Chest Port  1 View  Result Date: 02/01/2019 CLINICAL DATA:  Shortness of breath. Additional history provided: Fever, cough and shortness of breath as well as generalized weakness. COVID test pending. EXAM: PORTABLE CHEST 1 VIEW COMPARISON:  Chest radiograph 01/16/2018 FINDINGS: Prior median sternotomy. Redemonstrated left chest dual lead pacer device. Overlying cardiac monitoring leads. Cardiomegaly.  Aortic atherosclerosis. Shallow inspiration radiograph. Mild bibasilar opacities may reflect incomplete atelectasis or developing pneumonia. No sizable pleural effusion or evidence of pneumothorax. No acute bony abnormality. IMPRESSION: Shallow inspiration radiograph. Bibasilar opacities may reflect incomplete atelectasis and/or pneumonia. Cardiomegaly.  Aortic atherosclerosis. Electronically Signed   By: Kellie Simmering DO   On: 02/01/2019 10:03   Left HIP Xray UNILAT WITH PELVIS 2-3 VIEWS LEFT  Result Date: 02/16/2019 CLINICAL DATA:  [place]. Left hip pain. EXAM: DG HIP (WITH OR WITHOUT PELVIS) 2-3V LEFT COMPARISON:  None. FINDINGS: Both hips are normally located. No definite hip fractures. The pubic symphysis and SI joints are intact. No obvious pelvic fractures. If symptoms persist CT may be helpful for further evaluation. IMPRESSION: No definite hip or pelvic fractures. CT may be helpful for further evaluation if symptoms persist. Electronically Signed   By: Marijo Sanes M.D.   On: 02/16/2019 15:40     CBC Recent Labs  Lab 02/16/19 0052 02/17/19 0519 02/18/19 0538  WBC 18.2* 14.7* 13.5*  HGB 10.1* 11.6* 10.5*  HCT 33.2* 39.0 34.4*  PLT 151 142* 140*  MCV 79.2* 80.2 79.1*  MCH 24.1* 23.9* 24.1*  MCHC 30.4 29.7* 30.5  RDW 18.6* 18.8* 18.7*  LYMPHSABS 1.1 0.6*  --   MONOABS 0.9 0.5  --   EOSABS 0.0 0.0  --   BASOSABS 0.0 0.0  --     Chemistries  Recent Labs  Lab 02/16/19 0052 02/17/19 0519 02/18/19 0538  NA 132* 137 131*  K 3.6 4.6 4.6  CL 92* 95* 93*  CO2 31 27 29   GLUCOSE 171* 230* 305*  BUN  26* 39* 46*  CREATININE 1.22 1.08 1.09  CALCIUM 8.1* 8.6* 8.4*  MG  --  1.6* 2.0  AST  --  25 27  ALT  --  25 35  ALKPHOS  --  62 61  BILITOT  --  1.2 1.0   ------------------------------------------------------------------------------------------------------------------ No results for input(s): CHOL, HDL, LDLCALC, TRIG, CHOLHDL, LDLDIRECT in the last 72 hours.  Lab Results  Component Value Date   HGBA1C 6.4 (H) 02/03/2019   ------------------------------------------------------------------------------------------------------------------ No results  for input(s): TSH, T4TOTAL, T3FREE, THYROIDAB in the last 72 hours.  Invalid input(s): FREET3 ------------------------------------------------------------------------------------------------------------------ Recent Labs    02/17/19 0519 02/18/19 0538  FERRITIN 196 162   Coagulation profile Recent Labs  Lab 02/16/19 0051 02/16/19 1509 02/17/19 0519 02/18/19 0538  INR 6.5* 5.4* 4.9* 6.1*    Recent Labs    02/17/19 0519 02/18/19 0538  DDIMER 2.12* 1.91*    Cardiac Enzymes No results for input(s): CKMB, TROPONINI, MYOGLOBIN in the last 168 hours.  Invalid input(s): CK ------------------------------------------------------------------------------------------------------------------    Component Value Date/Time   BNP 332.0 (H) 01/16/2018 1120    Roxan Hockey M.D on 02/18/2019 at 4:56 PM  Go to [email or web address] - for contact info  [facility] - Office  [phone]

## 2019-02-18 NOTE — Progress Notes (Signed)
ANTICOAGULATION CONSULT NOTE -  Pharmacy Consult for warfarin dosing Indication:  Atrial fibrillation w/ permanent pacemaker  Allergies  Allergen Reactions  . Crestor [Rosuvastatin Calcium] Other (See Comments)    Urination of blood  . Latex Rash and Other (See Comments)    Gloves cause welts on hands!!    Patient Measurements: Height: 5\' 11"  (180.3 cm) Weight: 218 lb 14.7 oz (99.3 kg) IBW/kg (Calculated) : 75.3  HEPARIN DW (KG): 95.7  Vital Signs: Temp: 97.9 F (36.6 C) (01/19 0516) Temp Source: Oral (01/19 0516) BP: 125/78 (01/19 0516) Pulse Rate: 60 (01/19 0516)  Labs: Recent Labs    02/16/19 0051 02/16/19 0052 02/16/19 0052 02/16/19 1509 02/17/19 0519 02/18/19 0538  HGB  --  10.1*   < >  --  11.6* 10.5*  HCT  --  33.2*  --   --  39.0 34.4*  PLT  --  151  --   --  142* 140*  LABPROT   < >  --   --  49.7* 46.1* 54.6*  INR   < >  --   --  5.4* 4.9* 6.1*  CREATININE  --  1.22  --   --  1.08 1.09   < > = values in this interval not displayed.    Estimated Creatinine Clearance: 72.5 mL/min (by C-G formula based on SCr of 1.09 mg/dL).   Assessment: Pharmacy consulted to dose warfarin for this 74 yo male with exacerbation of CoVid-19 pneumonia. He takes warfarin at home chronically for atrial fibrillation and has a permanent pacemaker. Unable to verify patient's home warfarin regimen at this time and patient is unable to tell when he had his last dose of warfarin.   1/17  INR:   high at 6.5, patient was given phytonadione 2.5mg  po x1 dose. 1/18 INR 4.9, remains supratherapeutic 1/19 INR 6.1, supratherapeutic  Goal of Therapy:  INR 2-3 Monitor platelets by anticoagulation protocol: Yes   Plan:  No warfarin today Daily INR and CBC Monitor for signs and symptoms of bleeding   Isac Sarna, BS Vena Austria, BCPS Clinical Pharmacist Pager [phone] 02/18/2019 9:03 AM

## 2019-02-18 NOTE — Progress Notes (Signed)
CRITICAL VALUE ALERT  Critical Value:  inr 601  Date & Time Notied:  02/18/19 6:59 AM   Provider Notified: [email or web address]  Orders Received/Actions taken: awaiting response

## 2019-02-18 NOTE — Progress Notes (Signed)
Inpatient Diabetes Program Recommendations  AACE/ADA: New Consensus Statement on Inpatient Glycemic Control (2015)  Target Ranges:  Prepandial:   less than 140 mg/dL      Peak postprandial:   less than 180 mg/dL (1-2 hours)      Critically ill patients:  140 - 180 mg/dL   Lab Results  Component Value Date   GLUCAP 207 (H) 02/17/2019   HGBA1C 6.4 (H) 02/03/2019    Review of Glycemic Control Results for SADAT, YARTER (MRN [id number]) as of 02/18/2019 09:36  Ref. Range 02/16/2019 16:41 02/16/2019 21:13 02/17/2019 07:30  Glucose-Capillary Latest Ref Range: 70 - 99 mg/dL 236 (H) 213 (H) 207 (H)   Diabetes history: DM2 Outpatient Diabetes medications: Glucotrol 10 mg  + Metformin 2 gm qd + Januvia 100 mg qd Current orders for Inpatient glycemic control: None  Inpatient Diabetes Program Recommendations:   -Glycemic control order set with Novolog correction  Sensitive tid + hs 0-5 units  Thank you, Bethena Roys E. Jaegar Croft, RN, MSN, CDE  Diabetes Coordinator Inpatient Glycemic Control Team Team Pager [phone] (8am-5pm) 02/18/2019 9:53 AM

## 2019-02-19 DIAGNOSIS — I509 Heart failure, unspecified: Secondary | ICD-10-CM | POA: Diagnosis not present

## 2019-02-19 DIAGNOSIS — J449 Chronic obstructive pulmonary disease, unspecified: Secondary | ICD-10-CM | POA: Diagnosis not present

## 2019-02-19 DIAGNOSIS — Z7901 Long term (current) use of anticoagulants: Secondary | ICD-10-CM | POA: Diagnosis not present

## 2019-02-19 DIAGNOSIS — U071 COVID-19: Secondary | ICD-10-CM | POA: Diagnosis not present

## 2019-02-19 DIAGNOSIS — R7989 Other specified abnormal findings of blood chemistry: Secondary | ICD-10-CM | POA: Diagnosis not present

## 2019-02-19 DIAGNOSIS — I110 Hypertensive heart disease with heart failure: Secondary | ICD-10-CM | POA: Diagnosis not present

## 2019-02-19 DIAGNOSIS — M6281 Muscle weakness (generalized): Secondary | ICD-10-CM | POA: Diagnosis not present

## 2019-02-19 DIAGNOSIS — D509 Iron deficiency anemia, unspecified: Secondary | ICD-10-CM | POA: Diagnosis not present

## 2019-02-19 DIAGNOSIS — R1310 Dysphagia, unspecified: Secondary | ICD-10-CM | POA: Diagnosis not present

## 2019-02-19 DIAGNOSIS — E559 Vitamin D deficiency, unspecified: Secondary | ICD-10-CM | POA: Diagnosis not present

## 2019-02-19 DIAGNOSIS — M48062 Spinal stenosis, lumbar region with neurogenic claudication: Secondary | ICD-10-CM | POA: Diagnosis not present

## 2019-02-19 DIAGNOSIS — M25562 Pain in left knee: Secondary | ICD-10-CM | POA: Diagnosis not present

## 2019-02-19 DIAGNOSIS — R5381 Other malaise: Secondary | ICD-10-CM | POA: Diagnosis not present

## 2019-02-19 DIAGNOSIS — R69 Illness, unspecified: Secondary | ICD-10-CM | POA: Diagnosis not present

## 2019-02-19 DIAGNOSIS — M1712 Unilateral primary osteoarthritis, left knee: Secondary | ICD-10-CM | POA: Diagnosis not present

## 2019-02-19 DIAGNOSIS — R262 Difficulty in walking, not elsewhere classified: Secondary | ICD-10-CM | POA: Diagnosis not present

## 2019-02-19 DIAGNOSIS — I2510 Atherosclerotic heart disease of native coronary artery without angina pectoris: Secondary | ICD-10-CM | POA: Diagnosis not present

## 2019-02-19 DIAGNOSIS — E1140 Type 2 diabetes mellitus with diabetic neuropathy, unspecified: Secondary | ICD-10-CM | POA: Diagnosis not present

## 2019-02-19 DIAGNOSIS — E119 Type 2 diabetes mellitus without complications: Secondary | ICD-10-CM | POA: Diagnosis not present

## 2019-02-19 DIAGNOSIS — E785 Hyperlipidemia, unspecified: Secondary | ICD-10-CM | POA: Diagnosis not present

## 2019-02-19 DIAGNOSIS — I482 Chronic atrial fibrillation, unspecified: Secondary | ICD-10-CM | POA: Diagnosis not present

## 2019-02-19 DIAGNOSIS — Z7401 Bed confinement status: Secondary | ICD-10-CM | POA: Diagnosis not present

## 2019-02-19 DIAGNOSIS — R1311 Dysphagia, oral phase: Secondary | ICD-10-CM | POA: Diagnosis not present

## 2019-02-19 DIAGNOSIS — I10 Essential (primary) hypertension: Secondary | ICD-10-CM | POA: Diagnosis not present

## 2019-02-19 DIAGNOSIS — I442 Atrioventricular block, complete: Secondary | ICD-10-CM | POA: Diagnosis not present

## 2019-02-19 DIAGNOSIS — M109 Gout, unspecified: Secondary | ICD-10-CM | POA: Diagnosis not present

## 2019-02-19 DIAGNOSIS — Z950 Presence of cardiac pacemaker: Secondary | ICD-10-CM | POA: Diagnosis not present

## 2019-02-19 DIAGNOSIS — K219 Gastro-esophageal reflux disease without esophagitis: Secondary | ICD-10-CM | POA: Diagnosis not present

## 2019-02-19 DIAGNOSIS — I77810 Thoracic aortic ectasia: Secondary | ICD-10-CM | POA: Diagnosis not present

## 2019-02-19 DIAGNOSIS — I4891 Unspecified atrial fibrillation: Secondary | ICD-10-CM | POA: Diagnosis not present

## 2019-02-19 DIAGNOSIS — R6889 Other general symptoms and signs: Secondary | ICD-10-CM | POA: Diagnosis not present

## 2019-02-19 DIAGNOSIS — I25810 Atherosclerosis of coronary artery bypass graft(s) without angina pectoris: Secondary | ICD-10-CM | POA: Diagnosis not present

## 2019-02-19 DIAGNOSIS — D649 Anemia, unspecified: Secondary | ICD-10-CM | POA: Diagnosis not present

## 2019-02-19 DIAGNOSIS — J1282 Pneumonia due to coronavirus disease 2019: Secondary | ICD-10-CM | POA: Diagnosis not present

## 2019-02-19 DIAGNOSIS — R1314 Dysphagia, pharyngoesophageal phase: Secondary | ICD-10-CM | POA: Diagnosis not present

## 2019-02-19 LAB — GLUCOSE, CAPILLARY
Glucose-Capillary: 257 mg/dL — ABNORMAL HIGH (ref 70–99)
Glucose-Capillary: 323 mg/dL — ABNORMAL HIGH (ref 70–99)
Glucose-Capillary: 352 mg/dL — ABNORMAL HIGH (ref 70–99)

## 2019-02-19 LAB — COMPREHENSIVE METABOLIC PANEL
ALT: 31 U/L (ref 0–44)
AST: 15 U/L (ref 15–41)
Albumin: 2.6 g/dL — ABNORMAL LOW (ref 3.5–5.0)
Alkaline Phosphatase: 66 U/L (ref 38–126)
Anion gap: 12 (ref 5–15)
BUN: 39 mg/dL — ABNORMAL HIGH (ref 8–23)
CO2: 28 mmol/L (ref 22–32)
Calcium: 8.6 mg/dL — ABNORMAL LOW (ref 8.9–10.3)
Chloride: 92 mmol/L — ABNORMAL LOW (ref 98–111)
Creatinine, Ser: 0.89 mg/dL (ref 0.61–1.24)
GFR calc Af Amer: >60 mL/min (ref >60)
GFR calc non Af Amer: >60 mL/min (ref >60)
Glucose, Bld: 267 mg/dL — ABNORMAL HIGH (ref 70–99)
Potassium: 4.2 mmol/L (ref 3.5–5.1)
Sodium: 132 mmol/L — ABNORMAL LOW (ref 135–145)
Total Bilirubin: 1 mg/dL (ref 0.3–1.2)
Total Protein: 5.8 g/dL — ABNORMAL LOW (ref 6.5–8.1)

## 2019-02-19 LAB — CBC
HCT: 34.4 % — ABNORMAL LOW (ref 39.0–52.0)
Hemoglobin: 10.6 g/dL — ABNORMAL LOW (ref 13.0–17.0)
MCH: 24.9 pg — ABNORMAL LOW (ref 26.0–34.0)
MCHC: 30.8 g/dL (ref 30.0–36.0)
MCV: 80.8 fL (ref 80.0–100.0)
Platelets: 136 10*3/uL — ABNORMAL LOW (ref 150–400)
RBC: 4.26 MIL/uL (ref 4.22–5.81)
RDW: 18.6 % — ABNORMAL HIGH (ref 11.5–15.5)
WBC: 11.4 10*3/uL — ABNORMAL HIGH (ref 4.0–10.5)
nRBC: 0 % (ref 0.0–0.2)

## 2019-02-19 LAB — PROTIME-INR
INR: 3.3 — ABNORMAL HIGH (ref 0.8–1.2)
Prothrombin Time: 33.3 seconds — ABNORMAL HIGH (ref 11.4–15.2)

## 2019-02-19 MED ORDER — ASCORBIC ACID 500 MG PO TABS: 500.0000 mg | ORAL_TABLET | Freq: Every day | ORAL | 1 refills | Status: AC

## 2019-02-19 MED ORDER — INSULIN ASPART 100 UNIT/ML FLEXPEN: 0.0000 [IU] | PEN_INJECTOR | Freq: Three times a day (TID) | SUBCUTANEOUS | 11 refills | Status: AC

## 2019-02-19 MED ORDER — WARFARIN SODIUM 2.5 MG PO TABS
2.5000 mg | ORAL_TABLET | Freq: Once | ORAL | Status: AC
  Administered 2019-02-19: 2.5 mg via ORAL
  Filled 2019-02-19: qty 1

## 2019-02-19 MED ORDER — CARVEDILOL 12.5 MG PO TABS: 12.5000 mg | ORAL_TABLET | Freq: Two times a day (BID) | ORAL | 2 refills | Status: AC

## 2019-02-19 MED ORDER — TRAZODONE HCL 100 MG PO TABS: 100.0000 mg | ORAL_TABLET | Freq: Every day | ORAL | 1 refills | Status: AC

## 2019-02-19 MED ORDER — ACETAMINOPHEN 325 MG PO TABS: 650.0000 mg | ORAL_TABLET | Freq: Four times a day (QID) | ORAL | 0 refills | Status: AC | PRN

## 2019-02-19 MED ORDER — WARFARIN SODIUM 5 MG PO TABS: ORAL_TABLET | ORAL | 1 refills | Status: AC

## 2019-02-19 MED ORDER — HYDROCODONE-ACETAMINOPHEN 10-325 MG PO TABS: 1.0000 | ORAL_TABLET | Freq: Two times a day (BID) | ORAL | 0 refills | Status: AC | PRN

## 2019-02-19 MED ORDER — ZINC SULFATE 220 (50 ZN) MG PO CAPS: 220.0000 mg | ORAL_CAPSULE | Freq: Every day | ORAL | 1 refills | Status: AC

## 2019-02-19 NOTE — Discharge Summary (Signed)
TSUNEO Collier, is a 74 y.o. male  DOB November 27, 1945  MRN [id number].  Admission date:  02/15/2019  Admitting Physician  Reubin Milan, MD  Discharge Date:  02/19/2019   Primary MD  Biagio Borg, MD  Recommendations for primary care physician for things to follow:   1) you are taking warfarin/Coumadin which is a blood thinner so Avoid ibuprofen/Advil/Aleve/Motrin/Goody Powders/Naproxen/BC powders/Meloxicam/Diclofenac/Indomethacin and other Nonsteroidal anti-inflammatory medications as these will make you more likely to bleed and can cause stomach ulcers, can also cause Kidney problems.   2) strongly advised repeat PT/INR and CBC blood test on Friday, 02/21/2019 and again on Monday, 02/24/2019  3) your Coumadin/warfarin has been changed to 5 mg on Monday Wednesdays and Fridays and 2.5 mg on Tuesdays, Thursday Saturday and Sundays -Your INR goal is between 2.0 and 3.0  4) you were diagnosed with COVID-19 infection on 02/01/2019,  You are strongly advised to  to isolate/quarantine for at least 21 days from the date of your diagnoses with COVID-19 infection--please always wear a mask if you have to go outside the house  Admission Diagnosis  Hip pain [M25.559] Elevated INR [R79.1] Hypoxia [R09.02] HCAP (healthcare-associated pneumonia) [J18.9] COVID-19 [U07.1]   Discharge Diagnosis  Hip pain [M25.559] Elevated INR [R79.1] Hypoxia [R09.02] HCAP (healthcare-associated pneumonia) [J18.9] COVID-19 [U07.1]    Principal Problem:   Pneumonia due to COVID-19 virus Active Problems:   Type 2 diabetes, uncontrolled, with neuropathy ([place])   Hyperlipidemia   GOUT   Essential hypertension   CAD, AUTOLOGOUS BYPASS GRAFT   Lumbar radiculopathy, chronic   Atrial fibrillation ([place])   Spinal stenosis, lumbar region, with neurogenic claudication   Microcytic anemia   Gastroesophageal reflux disease   HCAP  (healthcare-associated pneumonia)   Acute respiratory disease due to COVID-19 virus      Past Medical History:  Diagnosis Date  . Aortic root dilatation ([place]) 02/02/2011  . Arthritis    "lower back; going back down both my sciatic nerves"  . 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 BENIGN PROSTATIC HYPERTROPHY 08/29/2006   takes Flomax daily  . 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 Cataracts, bilateral    immature  . CHF (congestive heart failure) ([facility])    takes Lasix daily  . Chronic back pain    HNP   . CORONARY ARTERY DISEASE 08/29/2006   takes Coumadin daily  . DEPRESSION 08/29/2006  . DIABETES MELLITUS, TYPE II 08/29/2006   takes Metformin,Januvia,and Glipizide  daily  . DIASTOLIC HEART FAILURE, CHRONIC 06/09/2008  . GOUT 04/22/2007   takes Allopurinol daily  . History of migraine    98yr ago  . HYPERLIPIDEMIA 08/29/2006   takes Atorvastatin daily  . HYPERTENSION 08/29/2006   takes Lisinopril daily  . INSOMNIA-SLEEP DISORDER-UNSPEC 10/23/2007  . Left lumbar radiculopathy 05/30/2010  . LUMBAR RADICULOPATHY, RIGHT 06/10/2007  . Muscle spasm    takes Zanaflex daily  . Myocardial infarction ([place] 12/27/10   "I've had several MIs"  . 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 PACEMAKER, PERMANENT  03/04/2008   pt denies this date  . Peripheral neuropathy    takes Gabapentin daily  . Presence of permanent cardiac pacemaker   . Shortness of breath dyspnea    "all my life" with exertion  . Sleep apnea    "if I lay flat I quit breathing; HOB up I'm fine"    Past Surgical History:  Procedure Laterality Date  . COLONOSCOPY    . CORONARY ANGIOPLASTY WITH STENT PLACEMENT  12/27/10   "I've had a total of 9 cardiac stents put in"  . CORONARY ARTERY BYPASS GRAFT  1992   CABG X 2  . ESOPHAGOGASTRODUODENOSCOPY N/A 12/01/2013   Procedure: ESOPHAGOGASTRODUODENOSCOPY (EGD);  Surgeon: Lafayette Dragon, MD;  Location: [facility];  Service: Endoscopy;  Laterality: N/A;  .  INSERT / REPLACE / REMOVE PACEMAKER  ~ 2004   initial pacemaker placement  . INSERT / REPLACE / REMOVE PACEMAKER  10/2009   generator change  . LUMBAR LAMINECTOMY/DECOMPRESSION MICRODISCECTOMY Right 03/23/2014   Procedure: LAMINECTOMY AND FORAMINOTOMY RIGHT LUMBAR THREE-FOUR,LUMBAR FOUR-FIVE, LUMBAR FIVE-SACRAL ONE;  Surgeon: Charlie Pitter, MD;  Location: [facility];  Service: Neurosurgery;  Laterality: Right;  right  . LUMBAR LAMINECTOMY/DECOMPRESSION MICRODISCECTOMY Left 12/01/2014   Procedure: Left Lumbar Three-Four, Lumbar Four-Five Laminectomy and Foraminotomy;  Surgeon: Earnie Larsson, MD;  Location: [facility];  Service: Neurosurgery;  Laterality: Left;  . s/p left arm surgury after work accident  1991   "2000# steel fell on it"  . s/p right hand surgury for foreign object  1970's   "piece of wood went in my hand; had to get that out"      HPI  from the history and physical done on the day of admission:   Chief Complaint: Shortness of breath.  HPI: [name] is a 74 y.o. male with medical history significant of aortic root dilation, atrioventricular block, pacemaker placement history, chronic diastolic CHF, CAD, hypertension, osteoarthritis, chronic back pain, depression, type 2 diabetes, gout, remote history of migraine headaches, hyperlipidemia, sleep apnea who was recently admitted and discharged from 102 2021 until 106 2021 due to acute respiratory failure with hypoxia due to COVID-19 pneumonia and is returning to the emergency department due to diarrhea.  He denies abdominal pain, melena or hematochezia.  Headache, sore throat or rhinorrhea.  No chest pain, palpitations, dizziness, diaphoresis, nausea or vomiting.  No dysuria, frequency or hematuria.  No polyuria, polydipsia, polyphagia or blurred vision.  ED Course: Initial vital signs temperature 102.8 F, pulse 65, respiration 18, blood pressure 137/68 mmHg and O2 sat 94% on room air.  The patient was given 2.5 mg of vitamin  K p.o., IV Zosyn and vancomycin.  His white count was 18.2, hemoglobin 7.1 g/dL and platelets 151.  PT was 57.2 seconds and INR 6.5.  Lactic acid was 1.4.  Sodium 132, potassium 3.6, chloride 92 and CO2 31 mmol/L.  Calcium 8.1, glucose 171, BUN 26 and creatinine 1.22 mg/dL.  His chest radiograph shows worsening airspace disease     Hospital Course:    Brief summary 74 y.o.malewith medical history significant foratrial fibrillation/flutter status post permanent pacemaker placement on anticoagulation with Coumadin, BPH, CAD with prior CABG, hypertension, dyslipidemia, diabetes, chronic gout, peripheral neuropathyadmitted on 02/16/2019 with acute hypoxic respiratory failure secondary to Covid pneumonia -Patient had severe COVID-19 respiratory infection including pneumonia requiring hospitalization x2 -you were diagnosed with COVID-19 infection on 02/01/2019,  You are strongly advised to  to isolate/quarantine for at least 21 days from the  date of your diagnoses with COVID-19 infection--please always wear a mask if you have to go outside the house  A/p 1)Acute hypoxic respiratory failure secondary to Covid pneumonia-- Patienthad first courseof remdesivir from 02/01/19 to 02/05/19 Patient receivedremdesivir 200 mg IV x 1on 02/16/2019,- -will not order further remdesivir at this time -Treated with IV Decadron -Patient received Actemra x1 dose,will not order further Actemra at this time Treated with mucolytics, bronchodilators -Weaned off oxygen -Patient had severe COVID-19 respiratory infection and pneumonia requiring hospitalization x2 so advised--isolate/quarantine for at least 21 days from the date of your diagnoses with COVID-19 infection-  2)chronic anticoagulation---supratherapeutic INR with left groin hematoma/bruising -INR back up to 6.1, hemoglobin stable at 10.5 , patient previously received vitamin K 2.5 mg x 1, will give vitamin K 5 mg p.o. x1  on 02/18/2019 -INR down to  3.3 -Restart Coumadin at 5 mg on Monday Wednesdays and Fridays and 2.5 mg rest of the week --Follow CBC AND INR--- recheck PT/INR on Friday, 02/21/2019 and again on Monday, 02/24/2019  3)left hip pain/left groin bruising/hematoma---in the setting of supratherapeutic INR, no definite trauma, left hip x-rays without acute findings -Hematoma resolving -Monitor INR and CBC  4)Chronic atrial fibrillation/status post PPM---INR management as above in #2, continue Coreg for rate control  5)DM2--- A1c 6.4 reflecting excellent diabetic control PTA, patient now has worsening hyperglycemia due to IV Decadron use, anticipate improved glycemic control now that patient is off steroids  -Restart glipizide, Metformin and Januvia, -use Novolog/Humalog Sliding scale insulin with Accu-Cheks/Fingersticks as ordered  6)CAD/s/p Prior CABG-stable, chest pain-free, continue Coreg and atorvastatin  7)leukocytosis---suspect steroid-induced leukocytosis with white count down to 11.4 from 18 K   8)HTN--restart Lasix and lisinopril,Coreg as ordered,  Patient had severe COVID-19 respiratory infection including pneumonia requiring hospitalization x2 -you were diagnosed with COVID-19 infection on 02/01/2019,  You are strongly advised to  to isolate/quarantine for at least 21 days from the date of your diagnoses with COVID-19 infection--please always wear a mask if you have to go outside the house   Disposition  -transfer to SNF bed placement  Code Status : DNR  Family Communication:   (patient is alert, awake and coherent) Discussed with wife---[phone]  Disposition Plan  : SNF   Discharge Condition: stable  Follow UP  Contact information for after-discharge care    Destination    [facility] Preferred SNF .   Service: Skilled Nursing Contact information: [phone]               Diet and Activity recommendation:  As  advised  Discharge Instructions    Discharge Instructions    Call MD for:  difficulty breathing, headache or visual disturbances   Complete by: As directed    Call MD for:  extreme fatigue   Complete by: As directed    Call MD for:  persistant dizziness or light-headedness   Complete by: As directed    Call MD for:  persistant nausea and vomiting   Complete by: As directed    Call MD for:  severe uncontrolled pain   Complete by: As directed    Call MD for:  temperature >100.4   Complete by: As directed    Diet - low sodium heart healthy   Complete by: As directed    Discharge instructions   Complete by: As directed    1) you are taking warfarin/Coumadin which is a blood thinner so Avoid ibuprofen/Advil/Aleve/Motrin/Goody Powders/Naproxen/BC powders/Meloxicam/Diclofenac/Indomethacin and  other Nonsteroidal anti-inflammatory medications as these will make you more likely to bleed and can cause stomach ulcers, can also cause Kidney problems.   2) strongly advised repeat PT/INR and CBC blood test on Friday, 02/21/2019 and again on Monday, 02/24/2019  3) your Coumadin/warfarin has been changed to 5 mg on Monday Wednesdays and Fridays and 2.5 mg on Tuesdays, Thursday Saturday and Sundays -Your INR goal is between 2.0 and 3.0  4) you were diagnosed with COVID-19 infection on 02/01/2019,  You are strongly advised to  to isolate/quarantine for at least 21 days from the date of your diagnoses with COVID-19 infection--please always wear a mask if you have to go outside the house   Increase activity slowly   Complete by: As directed         Discharge Medications     Allergies as of 02/19/2019      Reactions   Crestor [rosuvastatin Calcium] Other (See Comments)   Urination of blood   Latex Rash, Other (See Comments)   Gloves cause welts on hands!!      Medication List    STOP taking these medications   predniSONE 20 MG tablet Commonly known as: Deltasone     TAKE these medications     acetaminophen 325 MG tablet Commonly known as: TYLENOL Take 2 tablets (650 mg total) by mouth every 6 (six) hours as needed for mild pain (or Fever >/= 101).   albuterol 108 (90 Base) MCG/ACT inhaler Commonly known as: ProAir HFA Inhale 2 puffs into the lungs every 6 (six) hours as needed for wheezing or shortness of breath.   allopurinol 300 MG tablet Commonly known as: ZYLOPRIM TAKE 1 TABLET BY MOUTH  DAILY   ascorbic acid 500 MG tablet Commonly known as: VITAMIN C Take 1 tablet (500 mg total) by mouth daily. Start taking on: February 20, 2019   atorvastatin 80 MG tablet Commonly known as: LIPITOR TAKE 1 TABLET BY MOUTH  DAILY   benzonatate 100 MG capsule Commonly known as: Tessalon Perles 1-2 tab by mouth every 6 hrs as needed for cough   carvedilol 12.5 MG tablet Commonly known as: COREG Take 1 tablet (12.5 mg total) by mouth 2 (two) times daily with a meal. What changed:   medication strength  how much to take  when to take this   dextromethorphan-guaiFENesin 30-600 MG 12hr tablet Commonly known as: MUCINEX DM Take 1 tablet by mouth 2 (two) times daily.   donepezil 5 MG tablet Commonly known as: ARICEPT TAKE 1 TABLET BY MOUTH  DAILY   fluticasone 50 MCG/ACT nasal spray Commonly known as: FLONASE Place 2 sprays into both nostrils daily.   furosemide 40 MG tablet Commonly known as: LASIX Take 2 tablets (80 mg total) by mouth 2 (two) times daily.   gabapentin 300 MG capsule Commonly known as: NEURONTIN TAKE 2 CAPSULES BY MOUTH 3  TIMES DAILY   glipiZIDE 10 MG 24 hr tablet Commonly known as: GLUCOTROL XL TAKE 1 TABLET BY MOUTH  DAILY WITH BREAKFAST   glucose blood test strip Commonly known as: ONE TOUCH ULTRA TEST CHECK BLOOD SUGAR TWO TIMES DAILY   HYDROcodone-acetaminophen 10-325 MG tablet Commonly known as: NORCO Take 1 tablet by mouth every 12 (twelve) hours as needed. What changed: when to take this   insulin aspart 100 UNIT/ML  FlexPen Commonly known as: NOVOLOG Inject 0-10 Units into the skin 3 (three) times daily with meals. insulin aspart (novoLOG) injection 0-10 Units  0-10 Units Subcutaneous, 3  times daily with meals  CBG < 70: Implement Hypoglycemia Standing Orders and refer to Hypoglycemia Standing Orders sidebar report   CBG 70 - 120: 0 unit CBG 121 - 150: 0 unit  CBG 151 - 200: 1 unit  CBG 201 - 250: 2 units  CBG 251 - 300: 4 units  CBG 301 - 350: 6 units   CBG 351 - 400: 8 units  CBG > 400: 10 units   iron polysaccharides 150 MG capsule Commonly known as: Nu-Iron Take 1 capsule (150 mg total) by mouth daily.   lisinopril 2.5 MG tablet Commonly known as: ZESTRIL Take 1 tablet (2.5 mg total) by mouth daily.   metFORMIN 500 MG tablet Commonly known as: GLUCOPHAGE TAKE 4 TABLETS BY MOUTH  DAILY   multivitamin with minerals Tabs tablet Take 1 tablet by mouth every morning.   ONE TOUCH ULTRA 2 w/Device Kit Use as directed   OneTouch Delica Lancets 15V Misc USE AS DIRECTED THREE TIMES DAILY TO CHECK BLOOD SUGAR   oxybutynin 10 MG 24 hr tablet Commonly known as: DITROPAN-XL TAKE 1 TABLET BY MOUTH AT  BEDTIME   pantoprazole 40 MG tablet Commonly known as: PROTONIX TAKE 1 TABLET (40 MG TOTAL) BY MOUTH DAILY. TAKE 30 MINUTES BEFORE A MEAL.   potassium chloride SA 20 MEQ tablet Commonly known as: KLOR-CON TAKE 1 TABLET BY MOUTH  EVERY EVENING   sitaGLIPtin 100 MG tablet Commonly known as: JANUVIA Take 1 tablet (100 mg total) by mouth daily.   tamsulosin 0.4 MG Caps capsule Commonly known as: FLOMAX TAKE 1 CAPSULE BY MOUTH  TWICE DAILY   tiZANidine 4 MG tablet Commonly known as: ZANAFLEX TAKE 1 TABLET BY MOUTH   EVERY 6 HOURS AS NEEDED FOR MUSCLE SPASMS.   traZODone 100 MG tablet Commonly known as: DESYREL Take 1 tablet (100 mg total) by mouth at bedtime.   Vitamin D 1000 units capsule Take 1,000 Units by mouth 2 (two) times daily. Reported on 02/15/2015   warfarin 5 MG  tablet Commonly known as: COUMADIN Take as directed. If you are unsure how to take this medication, talk to your nurse or doctor. Original instructions: 5 mg on M/W/F and 2.5 mg on other days What changed: See the new instructions.   zinc sulfate 220 (50 Zn) MG capsule Take 1 capsule (220 mg total) by mouth daily. Start taking on: February 20, 2019       Major procedures and Radiology Reports - PLEASE review detailed and final reports for all details, in brief -    DG Chest Portable 1 View  Result Date: 02/15/2019 CLINICAL DATA:  Fever. COVID positive 02/01/2019 EXAM: PORTABLE CHEST 1 VIEW COMPARISON:  Radiograph 02/01/2019. FINDINGS: Post median sternotomy. Left-sided pacemaker in place. Cardiomegaly mediastinal contours are unchanged. Low lung volumes persist. Patchy bilateral airspace disease, slightly worsened in the interim. No pulmonary edema, large pleural effusion or pneumothorax. No acute osseous abnormalities are seen. IMPRESSION: 1. Low lung volumes with slight worsening patchy bilateral airspace disease over the past 2 weeks, likely progressive pneumonia. 2. Stable cardiomegaly with pacemaker in place. Electronically Signed   By: Keith Rake M.D.   On: 02/15/2019 23:57   DG Chest Port 1 View  Result Date: 02/01/2019 CLINICAL DATA:  Shortness of breath. Additional history provided: Fever, cough and shortness of breath as well as generalized weakness. COVID test pending. EXAM: PORTABLE CHEST 1 VIEW COMPARISON:  Chest radiograph 01/16/2018 FINDINGS: Prior median sternotomy. Redemonstrated left chest dual lead pacer device.  Overlying cardiac monitoring leads. Cardiomegaly.  Aortic atherosclerosis. Shallow inspiration radiograph. Mild bibasilar opacities may reflect incomplete atelectasis or developing pneumonia. No sizable pleural effusion or evidence of pneumothorax. No acute bony abnormality. IMPRESSION: Shallow inspiration radiograph. Bibasilar opacities may reflect incomplete  atelectasis and/or pneumonia. Cardiomegaly.  Aortic atherosclerosis. Electronically Signed   By: Kellie Simmering DO   On: 02/01/2019 10:03   Left HIP Xray UNILAT WITH PELVIS 2-3 VIEWS LEFT  Result Date: 02/16/2019 CLINICAL DATA:  [place]. Left hip pain. EXAM: DG HIP (WITH OR WITHOUT PELVIS) 2-3V LEFT COMPARISON:  None. FINDINGS: Both hips are normally located. No definite hip fractures. The pubic symphysis and SI joints are intact. No obvious pelvic fractures. If symptoms persist CT may be helpful for further evaluation. IMPRESSION: No definite hip or pelvic fractures. CT may be helpful for further evaluation if symptoms persist. Electronically Signed   By: Marijo Sanes M.D.   On: 02/16/2019 15:40    Micro Results    Recent Results (from the past 240 hour(s))  Blood culture (routine x 2)     Status: None (Preliminary result)   Collection Time: 02/16/19 12:53 AM   Specimen: Right Antecubital; Blood  Result Value Ref Range Status   Specimen Description RIGHT ANTECUBITAL  Final   Special Requests   Final    BOTTLES DRAWN AEROBIC AND ANAEROBIC Blood Culture adequate volume   Culture   Final    NO GROWTH 3 DAYS Performed at [facility], [address], [place], [place] [address]    Report Status PENDING  Incomplete  Blood culture (routine x 2)     Status: None (Preliminary result)   Collection Time: 02/16/19  5:44 AM   Specimen: Left Antecubital; Blood  Result Value Ref Range Status   Specimen Description   Final    LEFT ANTECUBITAL BOTTLES DRAWN AEROBIC AND ANAEROBIC   Special Requests Blood Culture adequate volume  Final   Culture   Final    NO GROWTH 3 DAYS Performed at [facility], [address], [place], [place] [address]    Report Status PENDING  Incomplete       Today   Subjective    Huriel Matt today has no new complaints -No bleeding concerns, eating and drinking well,  -Weakness and fatigue and malaise persist          Patient has been seen and examined prior to  discharge   Objective   Blood pressure 136/68, pulse (!) 59, temperature 97.7 F (36.5 C), temperature source Oral, resp. rate 18, height 5' 11" (1.803 m), weight 99.3 kg, SpO2 96 %.   Intake/Output Summary (Last 24 hours) at 02/19/2019 1442 Last data filed at 02/19/2019 1300 Gross per 24 hour  Intake 480 ml  Output 1150 ml  Net -670 ml    Exam Gen:- Awake Alert, no conversational dyspnea HEENT:- [place].AT, No sclera icterus Neck-Supple Neck,No JVD,.  Lungs-diminished in bases with scattered rhonchi and wheezes CV- S1, S2 normal, regular, prior sternotomy scar left subclavian pacemaker in situ Abd-  +ve B.Sounds, Abd Soft, No tenderness,    Extremity/Skin:- No  edema, pedal pulses present  Psych-affect is appropriate, oriented x3 Neuro-generalized weakness, no new focal deficits, no tremors MSK-Resolving left anterior medial groin area with resolving hematoma/ecchymosis   Data Review   CBC w Diff:  Lab Results  Component Value Date   WBC 11.4 (H) 02/19/2019   HGB 10.6 (L) 02/19/2019   HCT 34.4 (L) 02/19/2019   PLT 136 (L) 02/19/2019  LYMPHOPCT 4 02/17/2019   MONOPCT 4 02/17/2019   EOSPCT 0 02/17/2019   BASOPCT 0 02/17/2019    CMP:  Lab Results  Component Value Date   NA 132 (L) 02/19/2019   NA 138 10/08/2018   K 4.2 02/19/2019   CL 92 (L) 02/19/2019   CO2 28 02/19/2019   BUN 39 (H) 02/19/2019   BUN 15 10/08/2018   CREATININE 0.89 02/19/2019   CREATININE 0.83 11/27/2013   PROT 5.8 (L) 02/19/2019   ALBUMIN 2.6 (L) 02/19/2019   BILITOT 1.0 02/19/2019   ALKPHOS 66 02/19/2019   AST 15 02/19/2019   ALT 31 02/19/2019  .   Total Discharge time is about 33 minutes  Roxan Hockey M.D on 02/19/2019 at 2:42 PM  Go to [email or web address] -  for contact info  [facility] - Office  [phone]

## 2019-02-19 NOTE — Clinical Social Work Note (Signed)
Patient discharging to [facility]. Discharge clinicals sent to facility.  Spouse notified of discharge.  RN to call report. Nursing secretary to call EMS once patient is ready.  TOC signing off.    Wen Merced, Clydene Pugh, LCSW

## 2019-02-19 NOTE — Discharge Instructions (Signed)
1) you are taking warfarin/Coumadin which is a blood thinner so Avoid ibuprofen/Advil/Aleve/Motrin/Goody Powders/Naproxen/BC powders/Meloxicam/Diclofenac/Indomethacin and other Nonsteroidal anti-inflammatory medications as these will make you more likely to bleed and can cause stomach ulcers, can also cause Kidney problems.   2) strongly advised repeat PT/INR and CBC blood test on Friday, 02/21/2019 and again on Monday, 02/24/2019  3) your Coumadin/warfarin has been changed to 5 mg on Monday Wednesdays and Fridays and 2.5 mg on Tuesdays, Thursday Saturday and Sundays -Your INR goal is between 2.0 and 3.0  4) you were diagnosed with COVID-19 infection on 02/01/2019,  You are strongly advised to  to isolate/quarantine for at least 21 days from the date of your diagnoses with COVID-19 infection--please always wear a mask if you have to go outside the house

## 2019-02-19 NOTE — Progress Notes (Addendum)
ANTICOAGULATION CONSULT NOTE -  Pharmacy Consult for warfarin dosing Indication:  Atrial fibrillation w/ permanent pacemaker  Allergies  Allergen Reactions  . Crestor [Rosuvastatin Calcium] Other (See Comments)    Urination of blood  . Latex Rash and Other (See Comments)    Gloves cause welts on hands!!    Patient Measurements: Height: 5\' 11"  (180.3 cm) Weight: 218 lb 14.7 oz (99.3 kg) IBW/kg (Calculated) : 75.3  HEPARIN DW (KG): 95.7  Vital Signs: Temp: 97.8 F (36.6 C) (01/20 0509) Temp Source: Oral (01/20 0509) BP: 128/79 (01/20 0509) Pulse Rate: 59 (01/20 0509)  Labs: Recent Labs    02/17/19 0519 02/17/19 0519 02/18/19 0538 02/18/19 1800 02/19/19 0603  HGB 11.6*   < > 10.5*  --  10.6*  HCT 39.0  --  34.4*  --  34.4*  PLT 142*  --  140*  --  136*  LABPROT 46.1*   < > 54.6* 45.2* 33.3*  INR 4.9*   < > 6.1* 4.8* 3.3*  CREATININE 1.08  --  1.09  --  0.89   < > = values in this interval not displayed.    Estimated Creatinine Clearance: 88.8 mL/min (by C-G formula based on SCr of 0.89 mg/dL).   Assessment: Pharmacy consulted to dose warfarin for this 74 yo male with exacerbation of CoVid-19 pneumonia. He takes warfarin at home chronically for atrial fibrillation and has a permanent pacemaker.  Per anticoag clinic notes- patient takes 5 mg MWF and 2.5 mg ROW. Stable dosing. Unknown reason for elevated INR on admission.   1/17  INR:   high at 6.5, patient was given phytonadione 2.5mg  po x1 dose. 1/18 INR 4.9, remains supratherapeutic 1/19 INR 6.1, supratherapeutic. Vitamin K 5 mg given 1/20 INR improving at 3.3  Goal of Therapy:  INR 2-3 Monitor platelets by anticoagulation protocol: Yes   Plan:  Warfarin 2.5 mg x 1- Likely below 3 due to previous vitamin K Daily INR and CBC Monitor for signs and symptoms of bleeding   Margot Ables, PharmD Clinical Pharmacist 02/19/2019 8:58 AM

## 2019-02-19 NOTE — Progress Notes (Signed)
Inpatient Diabetes Program Recommendations  AACE/ADA: New Consensus Statement on Inpatient Glycemic Control (2015)  Target Ranges:  Prepandial:   less than 140 mg/dL      Peak postprandial:   less than 180 mg/dL (1-2 hours)      Critically ill patients:  140 - 180 mg/dL   Lab Results  Component Value Date   GLUCAP 323 (H) 02/19/2019   HGBA1C 6.4 (H) 02/03/2019    Review of Glycemic Control Results for [name], [name] (MRN [id number]) as of 02/19/2019 13:09  Ref. Range 02/18/2019 11:44 02/18/2019 16:55 02/18/2019 21:08 02/19/2019 07:27 02/19/2019 11:10  Glucose-Capillary Latest Ref Range: 70 - 99 mg/dL 371 (H) 337 (H) 338 (H) 257 (H) 323 (H)   Diabetes history: DM2 Outpatient Diabetes medications: Glucotrol 10 mg  + Metformin 2 gm qd + Januvia 100 mg qd Current orders for Inpatient glycemic control: Novolog sensitive correction tid + hs  Inpatient Diabetes Program Recommendations:   Please consider following changes: -Add basal Levemir 10 units bid (0.2 units/kg x 99.3 kg) -Increase Novolog correction to moderate tid + hs 0-5 -Trajenta 5 mg qd  Patient not eating regularly @ this time, but may need to add meal coverage when eating improves.  Thank you, Nani Gasser. Zykeria Laguardia, RN, MSN, CDE  Diabetes Coordinator Inpatient Glycemic Control Team Team Pager [phone] (8am-5pm) 02/19/2019 1:18 PM

## 2019-02-20 DIAGNOSIS — I4891 Unspecified atrial fibrillation: Secondary | ICD-10-CM | POA: Diagnosis not present

## 2019-02-20 DIAGNOSIS — R6889 Other general symptoms and signs: Secondary | ICD-10-CM | POA: Diagnosis not present

## 2019-02-21 ENCOUNTER — Other Ambulatory Visit: Payer: Self-pay | Admitting: *Deleted

## 2019-02-21 LAB — CULTURE, BLOOD (ROUTINE X 2)
Culture: NO GROWTH
Culture: NO GROWTH
Special Requests: ADEQUATE
Special Requests: ADEQUATE

## 2019-02-21 NOTE — Patient Outreach (Addendum)
[facility]) Care Management  02/21/2019  JERIMY WOLINSKY 02-11-1945 [id number]   Noted that member was readmitted to hospital [id number] for Covid complications.  Per chart, he was discharged to [facility] for rehab, call placed to facility to confirm.  Will close case at this time, will anticipate new referral once discharged back home.  Valente David, [place], MSN [place] [phone]

## 2019-02-22 DIAGNOSIS — D649 Anemia, unspecified: Secondary | ICD-10-CM | POA: Diagnosis not present

## 2019-02-22 DIAGNOSIS — R6889 Other general symptoms and signs: Secondary | ICD-10-CM | POA: Diagnosis not present

## 2019-02-24 DIAGNOSIS — I442 Atrioventricular block, complete: Secondary | ICD-10-CM | POA: Diagnosis not present

## 2019-02-24 DIAGNOSIS — U071 COVID-19: Secondary | ICD-10-CM | POA: Diagnosis not present

## 2019-02-24 DIAGNOSIS — I482 Chronic atrial fibrillation, unspecified: Secondary | ICD-10-CM | POA: Diagnosis not present

## 2019-02-24 DIAGNOSIS — K219 Gastro-esophageal reflux disease without esophagitis: Secondary | ICD-10-CM | POA: Diagnosis not present

## 2019-02-25 ENCOUNTER — Ambulatory Visit: Payer: Medicare Other | Admitting: *Deleted

## 2019-02-25 DIAGNOSIS — R6889 Other general symptoms and signs: Secondary | ICD-10-CM | POA: Diagnosis not present

## 2019-02-25 DIAGNOSIS — E559 Vitamin D deficiency, unspecified: Secondary | ICD-10-CM | POA: Diagnosis not present

## 2019-02-25 DIAGNOSIS — I4891 Unspecified atrial fibrillation: Secondary | ICD-10-CM | POA: Diagnosis not present

## 2019-02-25 DIAGNOSIS — E785 Hyperlipidemia, unspecified: Secondary | ICD-10-CM | POA: Diagnosis not present

## 2019-02-25 DIAGNOSIS — E119 Type 2 diabetes mellitus without complications: Secondary | ICD-10-CM | POA: Diagnosis not present

## 2019-02-25 DIAGNOSIS — D649 Anemia, unspecified: Secondary | ICD-10-CM | POA: Diagnosis not present

## 2019-02-26 DIAGNOSIS — I10 Essential (primary) hypertension: Secondary | ICD-10-CM | POA: Diagnosis not present

## 2019-02-26 DIAGNOSIS — D649 Anemia, unspecified: Secondary | ICD-10-CM | POA: Diagnosis not present

## 2019-02-27 ENCOUNTER — Other Ambulatory Visit: Payer: Self-pay | Admitting: *Deleted

## 2019-02-27 ENCOUNTER — Inpatient Hospital Stay: Payer: Medicare Other | Admitting: Internal Medicine

## 2019-02-27 DIAGNOSIS — D649 Anemia, unspecified: Secondary | ICD-10-CM | POA: Diagnosis not present

## 2019-02-27 DIAGNOSIS — R7989 Other specified abnormal findings of blood chemistry: Secondary | ICD-10-CM | POA: Diagnosis not present

## 2019-02-27 NOTE — Patient Outreach (Signed)
[facility]) Care Management  02/27/2019  [name] [birth year] [id number]   Case conference completed with multidisciplinary team to discuss management of member's care plan.  Notified team that member has discharged from hospital to SNF, will follow up with member to re-open case once discharged home.  Valente David, [place], MSN [place] [phone]

## 2019-03-06 ENCOUNTER — Ambulatory Visit: Payer: Medicare Other | Admitting: Family Medicine

## 2019-03-06 ENCOUNTER — Ambulatory Visit: Payer: Medicare Other | Admitting: *Deleted

## 2019-03-06 DIAGNOSIS — M25562 Pain in left knee: Secondary | ICD-10-CM | POA: Diagnosis not present

## 2019-03-06 DIAGNOSIS — R1310 Dysphagia, unspecified: Secondary | ICD-10-CM | POA: Diagnosis not present

## 2019-03-13 ENCOUNTER — Ambulatory Visit: Payer: Medicare Other | Admitting: Internal Medicine

## 2019-03-13 NOTE — Progress Notes (Deleted)
HPI: FU coronary artery disease status post coronary artery bypass graft, atrial flutter and previous pacemaker placement. Last cardiac catheterization in May of 2010 showed a normal left main, a 50-70% circumflex, a totally occluded LAD, and a focal 70% right coronary artery. The saphenous vein graft to the circumflex was occluded. The LIMA to the LAD was patent. Ejection fraction was 60%. Carotid Dopplers in October of 2011 were normal. At previous OV pt noted to be in atrial flutter; seen by GT and rate control and anticoagulation recommended. Abdominal CT 12/18 showed no aneurysm.Last echo 2019 showed normal LV systolic function, mildly dilated aortic root, moderate left atrial enlargement, mild right ventricular enlargement right atrial enlargement. Nuclear study June 2019 showed pacing induced septal defect but otherwise normal perfusion and ejection fraction 49%. Since last seen,  Current Outpatient Medications  Medication Sig Dispense Refill  . acetaminophen (TYLENOL) 325 MG tablet Take 2 tablets (650 mg total) by mouth every 6 (six) hours as needed for mild pain (or Fever >/= 101). 12 tablet 0  . albuterol (PROAIR HFA) 108 (90 Base) MCG/ACT inhaler Inhale 2 puffs into the lungs every 6 (six) hours as needed for wheezing or shortness of breath. 3 Inhaler 3  . allopurinol (ZYLOPRIM) 300 MG tablet TAKE 1 TABLET BY MOUTH  DAILY 90 tablet 2  . ascorbic acid (VITAMIN C) 500 MG tablet Take 1 tablet (500 mg total) by mouth daily. 30 tablet 1  . atorvastatin (LIPITOR) 80 MG tablet TAKE 1 TABLET BY MOUTH  DAILY 90 tablet 3  . benzonatate (TESSALON PERLES) 100 MG capsule 1-2 tab by mouth every 6 hrs as needed for cough (Patient not taking: Reported on 02/16/2019) 60 capsule 1  . Blood Glucose Monitoring Suppl (ONE TOUCH ULTRA 2) w/Device KIT Use as directed 1 each 3  . carvedilol (COREG) 12.5 MG tablet Take 1 tablet (12.5 mg total) by mouth 2 (two) times daily with a meal. 60 tablet 2  .  Cholecalciferol (VITAMIN D) 1000 UNITS capsule Take 1,000 Units by mouth 2 (two) times daily. Reported on 02/15/2015    . dextromethorphan-guaiFENesin (MUCINEX DM) 30-600 MG 12hr tablet Take 1 tablet by mouth 2 (two) times daily. (Patient not taking: Reported on 02/16/2019) 30 tablet 0  . donepezil (ARICEPT) 5 MG tablet TAKE 1 TABLET BY MOUTH  DAILY 90 tablet 3  . fluticasone (FLONASE) 50 MCG/ACT nasal spray Place 2 sprays into both nostrils daily. 48 g 5  . furosemide (LASIX) 40 MG tablet Take 2 tablets (80 mg total) by mouth 2 (two) times daily. 720 tablet 3  . gabapentin (NEURONTIN) 300 MG capsule TAKE 2 CAPSULES BY MOUTH 3  TIMES DAILY 540 capsule 2  . glipiZIDE (GLUCOTROL XL) 10 MG 24 hr tablet TAKE 1 TABLET BY MOUTH  DAILY WITH BREAKFAST 90 tablet 2  . glucose blood (ONE TOUCH ULTRA TEST) test strip CHECK BLOOD SUGAR TWO TIMES DAILY 100 each 3  . HYDROcodone-acetaminophen (NORCO) 10-325 MG tablet Take 1 tablet by mouth every 12 (twelve) hours as needed. 12 tablet 0  . insulin aspart (NOVOLOG) 100 UNIT/ML FlexPen Inject 0-10 Units into the skin 3 (three) times daily with meals. insulin aspart (novoLOG) injection 0-10 Units  0-10 Units Subcutaneous, 3 times daily with meals  CBG < 70: Implement Hypoglycemia Standing Orders and refer to Hypoglycemia Standing Orders sidebar report   CBG 70 - 120: 0 unit CBG 121 - 150: 0 unit  CBG 151 - 200: 1 unit  CBG 201 - 250: 2 units  CBG 251 - 300: 4 units  CBG 301 - 350: 6 units   CBG 351 - 400: 8 units  CBG > 400: 10 units 15 mL 11  . iron polysaccharides (NU-IRON) 150 MG capsule Take 1 capsule (150 mg total) by mouth daily. (Patient not taking: Reported on 02/16/2019) 90 capsule 1  . lisinopril (ZESTRIL) 2.5 MG tablet Take 1 tablet (2.5 mg total) by mouth daily. 90 tablet 2  . metFORMIN (GLUCOPHAGE) 500 MG tablet TAKE 4 TABLETS BY MOUTH  DAILY 360 tablet 2  . Multiple Vitamin (MULTIVITAMIN WITH MINERALS) TABS tablet Take 1 tablet by mouth every morning.     Glory Rosebush DELICA LANCETS 38H MISC USE AS DIRECTED THREE TIMES DAILY TO CHECK BLOOD SUGAR 300 each 0  . oxybutynin (DITROPAN-XL) 10 MG 24 hr tablet TAKE 1 TABLET BY MOUTH AT  BEDTIME 90 tablet 3  . pantoprazole (PROTONIX) 40 MG tablet TAKE 1 TABLET (40 MG TOTAL) BY MOUTH DAILY. TAKE 30 MINUTES BEFORE A MEAL. 90 tablet 0  . potassium chloride SA (KLOR-CON) 20 MEQ tablet TAKE 1 TABLET BY MOUTH  EVERY EVENING 90 tablet 2  . sitaGLIPtin (JANUVIA) 100 MG tablet Take 1 tablet (100 mg total) by mouth daily. 90 tablet 3  . tamsulosin (FLOMAX) 0.4 MG CAPS capsule TAKE 1 CAPSULE BY MOUTH  TWICE DAILY 180 capsule 2  . tiZANidine (ZANAFLEX) 4 MG tablet TAKE 1 TABLET BY MOUTH   EVERY 6 HOURS AS NEEDED FOR MUSCLE SPASMS. 180 tablet 2  . traZODone (DESYREL) 100 MG tablet Take 1 tablet (100 mg total) by mouth at bedtime. 30 tablet 1  . warfarin (COUMADIN) 5 MG tablet 5 mg on M/W/F and 2.5 mg on other days 90 tablet 1  . zinc sulfate 220 (50 Zn) MG capsule Take 1 capsule (220 mg total) by mouth daily. 30 capsule 1   No current facility-administered medications for this visit.     Past Medical History:  Diagnosis Date  . Aortic root dilatation ([place]) 02/02/2011  . Arthritis    "lower back; going back down both my sciatic nerves"  . 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 BENIGN PROSTATIC HYPERTROPHY 08/29/2006   takes Flomax daily  . 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 Cataracts, bilateral    immature  . CHF (congestive heart failure) ([facility])    takes Lasix daily  . Chronic back pain    HNP   . CORONARY ARTERY DISEASE 08/29/2006   takes Coumadin daily  . DEPRESSION 08/29/2006  . DIABETES MELLITUS, TYPE II 08/29/2006   takes Metformin,Januvia,and Glipizide  daily  . DIASTOLIC HEART FAILURE, CHRONIC 06/09/2008  . GOUT 04/22/2007   takes Allopurinol daily  . History of migraine    53yr ago  . HYPERLIPIDEMIA 08/29/2006   takes Atorvastatin daily  . HYPERTENSION 08/29/2006   takes  Lisinopril daily  . INSOMNIA-SLEEP DISORDER-UNSPEC 10/23/2007  . Left lumbar radiculopathy 05/30/2010  . LUMBAR RADICULOPATHY, RIGHT 06/10/2007  . Muscle spasm    takes Zanaflex daily  . Myocardial infarction ([place] 12/27/10   "I've had several MIs"  . 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 PACEMAKER, PERMANENT 03/04/2008   pt denies this date  . Peripheral neuropathy    takes Gabapentin daily  . Presence of permanent cardiac pacemaker   . Shortness of breath dyspnea    "all my life" with exertion  . Sleep apnea    "if I lay flat I quit breathing;  HOB up I'm fine"    Past Surgical History:  Procedure Laterality Date  . COLONOSCOPY    . CORONARY ANGIOPLASTY WITH STENT PLACEMENT  12/27/10   "I've had a total of 9 cardiac stents put in"  . CORONARY ARTERY BYPASS GRAFT  1992   CABG X 2  . ESOPHAGOGASTRODUODENOSCOPY N/A 12/01/2013   Procedure: ESOPHAGOGASTRODUODENOSCOPY (EGD);  Surgeon: Lafayette Dragon, MD;  Location: [facility];  Service: Endoscopy;  Laterality: N/A;  . INSERT / REPLACE / REMOVE PACEMAKER  ~ 2004   initial pacemaker placement  . INSERT / REPLACE / REMOVE PACEMAKER  10/2009   generator change  . LUMBAR LAMINECTOMY/DECOMPRESSION MICRODISCECTOMY Right 03/23/2014   Procedure: LAMINECTOMY AND FORAMINOTOMY RIGHT LUMBAR THREE-FOUR,LUMBAR FOUR-FIVE, LUMBAR FIVE-SACRAL ONE;  Surgeon: Charlie Pitter, MD;  Location: [facility];  Service: Neurosurgery;  Laterality: Right;  right  . LUMBAR LAMINECTOMY/DECOMPRESSION MICRODISCECTOMY Left 12/01/2014   Procedure: Left Lumbar Three-Four, Lumbar Four-Five Laminectomy and Foraminotomy;  Surgeon: Earnie Larsson, MD;  Location: [facility];  Service: Neurosurgery;  Laterality: Left;  . s/p left arm surgury after work accident  1991   "2000# steel fell on it"  . s/p right hand surgury for foreign object  1970's   "piece of wood went in my hand; had to get that out"    Social History   Socioeconomic History  . Marital status: Married     Spouse name: Not on file  . Number of children: 2  . Years of education: Not on file  . Highest education level: Not on file  Occupational History  . Occupation: prior work Designer, industrial/product: UNEMPLOYED  . Occupation: disabled since 2004  Tobacco Use  . Smoking status: Former Smoker    Packs/day: 3.00    Years: 9.00    Pack years: 27.00    Types: Cigarettes    Quit date: 02/26/1973    Years since quitting: 46.0  . Smokeless tobacco: Former Systems developer  . Tobacco comment: quit smoking 68yr ago  Substance and Sexual Activity  . Alcohol use: No    Alcohol/week: 0.0 standard drinks  . Drug use: No    Comment: "used pouches of tobacco for a long time; quit those 12/09/1970"  . Sexual activity: Yes  Other Topics Concern  . Not on file  Social History Narrative  . Not on file   Social Determinants of Health   Financial Resource Strain:   . Difficulty of Paying Living Expenses: Not on file  Food Insecurity: No Food Insecurity  . Worried About RCharity fundraiserin the Last Year: Never true  . Ran Out of Food in the Last Year: Never true  Transportation Needs: No Transportation Needs  . Lack of Transportation (Medical): No  . Lack of Transportation (Non-Medical): No  Physical Activity:   . Days of Exercise per Week: Not on file  . Minutes of Exercise per Session: Not on file  Stress:   . Feeling of Stress : Not on file  Social Connections:   . Frequency of Communication with Friends and Family: Not on file  . Frequency of Social Gatherings with Friends and Family: Not on file  . Attends Religious Services: Not on file  . Active Member of Clubs or Organizations: Not on file  . Attends CArchivistMeetings: Not on file  . Marital Status: Not on file  Intimate Partner Violence:   . Fear of Current or Ex-Partner: Not on file  .  Emotionally Abused: Not on file  . Physically Abused: Not on file  . Sexually Abused: Not on file    Family History  Problem Relation  Age of Onset  . Diabetes Mother   . Diabetes Sister   . Heart disease Sister        2 sister died with heart disease  . Coronary artery disease Other 75       male, first degree relative  . Diabetes Other        1st degree relative  . Diabetes Sister   . Lung cancer Sister        deceased  . Diabetes Sister     ROS: no fevers or chills, productive cough, hemoptysis, dysphasia, odynophagia, melena, hematochezia, dysuria, hematuria, rash, seizure activity, orthopnea, PND, pedal edema, claudication. Remaining systems are negative.  Physical Exam: Well-developed well-nourished in no acute distress.  Skin is warm and dry.  HEENT is normal.  Neck is supple.  Chest is clear to auscultation with normal expansion.  Cardiovascular exam is regular rate and rhythm.  Abdominal exam nontender or distended. No masses palpated. Extremities show no edema. neuro grossly intact  ECG- personally reviewed  A/P  1 coronary artery disease-patient has not had recurrent chest pain.  Continue medical therapy including statin.  He is not on aspirin given need for anticoagulation.  2 atrial flutter-continue beta-blocker for rate control and Coumadin.  Patient declines DOAC's due to expense.  Previously evaluated by Dr. Lovena Le and ablation not indicated.  3 hypertension-blood pressure controlled.  Continue present medical regimen.  4 hyperlipidemia-continue statin.  5 chronic diastolic congestive heart failure-patient appears to be euvolemic.  Continue present dose of diuretic.  Check potassium and renal function.  6 prior pacemaker-managed by Dr. Lovena Le.  [name], MD

## 2019-03-24 ENCOUNTER — Ambulatory Visit: Payer: Medicare Other | Admitting: Cardiology

## 2019-03-31 DEATH — deceased

## 2019-04-02 ENCOUNTER — Encounter: Payer: Medicare Other | Admitting: Internal Medicine

## 2019-04-24 ENCOUNTER — Encounter: Payer: Medicare Other | Admitting: Internal Medicine

## 2019-07-02 ENCOUNTER — Ambulatory Visit: Payer: Medicare Other | Admitting: Internal Medicine
